# Patient Record
Sex: Male | Born: 1940
Health system: Southern US, Community
[De-identification: ages and names within clinical notes are randomized; demographics above are authoritative.]

## PROBLEM LIST (undated history)

## (undated) DIAGNOSIS — J449 Chronic obstructive pulmonary disease, unspecified: Secondary | ICD-10-CM

## (undated) DIAGNOSIS — K219 Gastro-esophageal reflux disease without esophagitis: Secondary | ICD-10-CM

## (undated) DIAGNOSIS — I5189 Other ill-defined heart diseases: Secondary | ICD-10-CM

## (undated) DIAGNOSIS — C4490 Unspecified malignant neoplasm of skin, unspecified: Secondary | ICD-10-CM

## (undated) DIAGNOSIS — M19011 Primary osteoarthritis, right shoulder: Secondary | ICD-10-CM

## (undated) DIAGNOSIS — G5600 Carpal tunnel syndrome, unspecified upper limb: Secondary | ICD-10-CM

## (undated) DIAGNOSIS — J189 Pneumonia, unspecified organism: Secondary | ICD-10-CM

## (undated) DIAGNOSIS — M5030 Other cervical disc degeneration, unspecified cervical region: Secondary | ICD-10-CM

## (undated) DIAGNOSIS — I10 Essential (primary) hypertension: Secondary | ICD-10-CM

## (undated) DIAGNOSIS — N200 Calculus of kidney: Secondary | ICD-10-CM

## (undated) DIAGNOSIS — I209 Angina pectoris, unspecified: Secondary | ICD-10-CM

## (undated) DIAGNOSIS — R55 Syncope and collapse: Secondary | ICD-10-CM

## (undated) DIAGNOSIS — I6523 Occlusion and stenosis of bilateral carotid arteries: Secondary | ICD-10-CM

## (undated) DIAGNOSIS — G5603 Carpal tunnel syndrome, bilateral upper limbs: Secondary | ICD-10-CM

## (undated) DIAGNOSIS — I38 Endocarditis, valve unspecified: Secondary | ICD-10-CM

## (undated) DIAGNOSIS — I639 Cerebral infarction, unspecified: Secondary | ICD-10-CM

## (undated) DIAGNOSIS — E785 Hyperlipidemia, unspecified: Secondary | ICD-10-CM

## (undated) DIAGNOSIS — I509 Heart failure, unspecified: Secondary | ICD-10-CM

## (undated) DIAGNOSIS — Z0389 Encounter for observation for other suspected diseases and conditions ruled out: Secondary | ICD-10-CM

## (undated) DIAGNOSIS — I4891 Unspecified atrial fibrillation: Secondary | ICD-10-CM

## (undated) DIAGNOSIS — C801 Malignant (primary) neoplasm, unspecified: Secondary | ICD-10-CM

## (undated) DIAGNOSIS — F419 Anxiety disorder, unspecified: Secondary | ICD-10-CM

## (undated) DIAGNOSIS — IMO0001 Reserved for inherently not codable concepts without codable children: Secondary | ICD-10-CM

## (undated) DIAGNOSIS — I2510 Atherosclerotic heart disease of native coronary artery without angina pectoris: Secondary | ICD-10-CM

## (undated) DIAGNOSIS — I82409 Acute embolism and thrombosis of unspecified deep veins of unspecified lower extremity: Secondary | ICD-10-CM

## (undated) DIAGNOSIS — I700 Atherosclerosis of aorta: Secondary | ICD-10-CM

## (undated) DIAGNOSIS — Z7901 Long term (current) use of anticoagulants: Secondary | ICD-10-CM

## (undated) DIAGNOSIS — C44300 Unspecified malignant neoplasm of skin of unspecified part of face: Secondary | ICD-10-CM

## (undated) HISTORY — DX: Carpal tunnel syndrome, unspecified upper limb: G56.00

## (undated) HISTORY — DX: Endocarditis, valve unspecified: I38

## (undated) HISTORY — PX: SKIN CANCER EXCISION: SHX779

## (undated) HISTORY — DX: Heart failure, unspecified: I50.9

## (undated) HISTORY — DX: Calculus of kidney: N20.0

---
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
## 2002-01-10 HISTORY — PX: CARDIAC CATHETERIZATION: SHX172

## 2002-01-10 HISTORY — PX: THROMBECTOMY: PRO61

## 2005-10-06 ENCOUNTER — Inpatient Hospital Stay: Payer: Self-pay | Admitting: Orthopedic Surgery

## 2005-10-06 ENCOUNTER — Other Ambulatory Visit: Payer: Self-pay

## 2007-04-13 ENCOUNTER — Emergency Department: Payer: Self-pay | Admitting: Emergency Medicine

## 2007-04-13 ENCOUNTER — Other Ambulatory Visit: Payer: Self-pay

## 2008-09-29 ENCOUNTER — Emergency Department: Payer: Self-pay | Admitting: Emergency Medicine

## 2009-07-06 ENCOUNTER — Observation Stay: Payer: Self-pay | Admitting: Internal Medicine

## 2009-09-19 ENCOUNTER — Emergency Department: Payer: Self-pay | Admitting: Emergency Medicine

## 2010-11-19 ENCOUNTER — Emergency Department: Payer: Self-pay | Admitting: Emergency Medicine

## 2011-01-25 ENCOUNTER — Observation Stay: Payer: Self-pay | Admitting: Internal Medicine

## 2011-01-25 LAB — URINALYSIS, COMPLETE
Bacteria: NONE SEEN
Bilirubin,UR: NEGATIVE
Glucose,UR: NEGATIVE mg/dL (ref 0–75)
Ketone: NEGATIVE
RBC,UR: NONE SEEN /HPF (ref 0–5)
Specific Gravity: 1.006 (ref 1.003–1.030)
WBC UR: <1 /HPF (ref 0–5)

## 2011-01-25 LAB — COMPREHENSIVE METABOLIC PANEL
Albumin: 3.6 g/dL (ref 3.4–5.0)
Alkaline Phosphatase: 103 U/L (ref 50–136)
Anion Gap: 11 (ref 7–16)
BUN: 15 mg/dL (ref 7–18)
Calcium, Total: 9 mg/dL (ref 8.5–10.1)
Chloride: 105 mmol/L (ref 98–107)
Glucose: 95 mg/dL (ref 65–99)
Potassium: 3.7 mmol/L (ref 3.5–5.1)
SGOT(AST): 26 U/L (ref 15–37)
SGPT (ALT): 19 U/L
Total Protein: 7.4 g/dL (ref 6.4–8.2)

## 2011-01-25 LAB — CBC
MCH: 30.4 pg (ref 26.0–34.0)
MCHC: 33.5 g/dL (ref 32.0–36.0)
Platelet: 193 10*3/uL (ref 150–440)
RDW: 12.7 % (ref 11.5–14.5)

## 2011-01-25 LAB — CK TOTAL AND CKMB (NOT AT ARMC): CK, Total: 52 U/L (ref 35–232)

## 2011-01-25 LAB — TROPONIN I: Troponin-I: <0.02 ng/mL

## 2011-01-26 LAB — CK TOTAL AND CKMB (NOT AT ARMC)
CK, Total: 44 U/L (ref 35–232)
CK-MB: <0.5 ng/mL — ABNORMAL LOW (ref 0.5–3.6)

## 2011-01-26 LAB — TROPONIN I: Troponin-I: <0.02 ng/mL

## 2013-02-05 DIAGNOSIS — F411 Generalized anxiety disorder: Secondary | ICD-10-CM | POA: Diagnosis not present

## 2013-02-05 DIAGNOSIS — R5383 Other fatigue: Secondary | ICD-10-CM | POA: Diagnosis not present

## 2013-02-05 DIAGNOSIS — F3289 Other specified depressive episodes: Secondary | ICD-10-CM | POA: Diagnosis not present

## 2013-02-05 DIAGNOSIS — I10 Essential (primary) hypertension: Secondary | ICD-10-CM | POA: Diagnosis not present

## 2013-02-05 DIAGNOSIS — K219 Gastro-esophageal reflux disease without esophagitis: Secondary | ICD-10-CM | POA: Diagnosis not present

## 2013-02-05 DIAGNOSIS — E7800 Pure hypercholesterolemia, unspecified: Secondary | ICD-10-CM | POA: Diagnosis not present

## 2013-02-05 DIAGNOSIS — R5381 Other malaise: Secondary | ICD-10-CM | POA: Diagnosis not present

## 2013-02-05 DIAGNOSIS — E559 Vitamin D deficiency, unspecified: Secondary | ICD-10-CM | POA: Diagnosis not present

## 2013-03-11 DIAGNOSIS — F329 Major depressive disorder, single episode, unspecified: Secondary | ICD-10-CM | POA: Diagnosis not present

## 2013-03-11 DIAGNOSIS — K219 Gastro-esophageal reflux disease without esophagitis: Secondary | ICD-10-CM | POA: Diagnosis not present

## 2013-03-11 DIAGNOSIS — R109 Unspecified abdominal pain: Secondary | ICD-10-CM | POA: Diagnosis not present

## 2013-03-11 DIAGNOSIS — G5600 Carpal tunnel syndrome, unspecified upper limb: Secondary | ICD-10-CM | POA: Diagnosis not present

## 2013-03-11 DIAGNOSIS — J449 Chronic obstructive pulmonary disease, unspecified: Secondary | ICD-10-CM | POA: Diagnosis not present

## 2013-03-11 DIAGNOSIS — F411 Generalized anxiety disorder: Secondary | ICD-10-CM | POA: Diagnosis not present

## 2013-03-11 DIAGNOSIS — Z79899 Other long term (current) drug therapy: Secondary | ICD-10-CM | POA: Diagnosis not present

## 2013-03-11 DIAGNOSIS — M129 Arthropathy, unspecified: Secondary | ICD-10-CM | POA: Diagnosis not present

## 2013-03-11 DIAGNOSIS — F3289 Other specified depressive episodes: Secondary | ICD-10-CM | POA: Diagnosis not present

## 2013-03-28 DIAGNOSIS — G5600 Carpal tunnel syndrome, unspecified upper limb: Secondary | ICD-10-CM | POA: Diagnosis not present

## 2013-04-03 DIAGNOSIS — G5600 Carpal tunnel syndrome, unspecified upper limb: Secondary | ICD-10-CM | POA: Diagnosis not present

## 2013-04-11 DIAGNOSIS — G5600 Carpal tunnel syndrome, unspecified upper limb: Secondary | ICD-10-CM | POA: Diagnosis not present

## 2013-04-11 DIAGNOSIS — F411 Generalized anxiety disorder: Secondary | ICD-10-CM | POA: Diagnosis not present

## 2013-04-11 DIAGNOSIS — J449 Chronic obstructive pulmonary disease, unspecified: Secondary | ICD-10-CM | POA: Diagnosis not present

## 2013-04-11 DIAGNOSIS — F329 Major depressive disorder, single episode, unspecified: Secondary | ICD-10-CM | POA: Diagnosis not present

## 2013-04-11 DIAGNOSIS — F3289 Other specified depressive episodes: Secondary | ICD-10-CM | POA: Diagnosis not present

## 2013-04-11 DIAGNOSIS — M129 Arthropathy, unspecified: Secondary | ICD-10-CM | POA: Diagnosis not present

## 2013-05-10 DIAGNOSIS — F411 Generalized anxiety disorder: Secondary | ICD-10-CM | POA: Diagnosis not present

## 2013-05-10 DIAGNOSIS — F3289 Other specified depressive episodes: Secondary | ICD-10-CM | POA: Diagnosis not present

## 2013-05-10 DIAGNOSIS — I10 Essential (primary) hypertension: Secondary | ICD-10-CM | POA: Diagnosis not present

## 2013-05-10 DIAGNOSIS — R5381 Other malaise: Secondary | ICD-10-CM | POA: Diagnosis not present

## 2013-05-10 DIAGNOSIS — E7800 Pure hypercholesterolemia, unspecified: Secondary | ICD-10-CM | POA: Diagnosis not present

## 2013-05-10 DIAGNOSIS — K219 Gastro-esophageal reflux disease without esophagitis: Secondary | ICD-10-CM | POA: Diagnosis not present

## 2013-05-10 DIAGNOSIS — Z79899 Other long term (current) drug therapy: Secondary | ICD-10-CM | POA: Diagnosis not present

## 2013-05-10 DIAGNOSIS — R1013 Epigastric pain: Secondary | ICD-10-CM | POA: Diagnosis not present

## 2013-05-10 DIAGNOSIS — F329 Major depressive disorder, single episode, unspecified: Secondary | ICD-10-CM | POA: Diagnosis not present

## 2013-05-10 DIAGNOSIS — R5383 Other fatigue: Secondary | ICD-10-CM | POA: Diagnosis not present

## 2013-05-30 ENCOUNTER — Ambulatory Visit: Payer: Self-pay | Admitting: Orthopedic Surgery

## 2013-05-30 DIAGNOSIS — I10 Essential (primary) hypertension: Secondary | ICD-10-CM | POA: Diagnosis not present

## 2013-05-30 DIAGNOSIS — Z01810 Encounter for preprocedural cardiovascular examination: Secondary | ICD-10-CM | POA: Diagnosis not present

## 2013-06-11 ENCOUNTER — Ambulatory Visit: Payer: Self-pay | Admitting: Orthopedic Surgery

## 2013-06-11 DIAGNOSIS — Z79899 Other long term (current) drug therapy: Secondary | ICD-10-CM | POA: Diagnosis not present

## 2013-06-11 DIAGNOSIS — R1013 Epigastric pain: Secondary | ICD-10-CM | POA: Diagnosis not present

## 2013-06-11 DIAGNOSIS — R0602 Shortness of breath: Secondary | ICD-10-CM | POA: Diagnosis not present

## 2013-06-11 DIAGNOSIS — Z8673 Personal history of transient ischemic attack (TIA), and cerebral infarction without residual deficits: Secondary | ICD-10-CM | POA: Diagnosis not present

## 2013-06-11 DIAGNOSIS — G5600 Carpal tunnel syndrome, unspecified upper limb: Secondary | ICD-10-CM | POA: Diagnosis not present

## 2013-06-11 DIAGNOSIS — Z87891 Personal history of nicotine dependence: Secondary | ICD-10-CM | POA: Diagnosis not present

## 2013-06-11 DIAGNOSIS — I10 Essential (primary) hypertension: Secondary | ICD-10-CM | POA: Diagnosis not present

## 2013-06-11 DIAGNOSIS — K3189 Other diseases of stomach and duodenum: Secondary | ICD-10-CM | POA: Diagnosis not present

## 2013-06-11 DIAGNOSIS — J449 Chronic obstructive pulmonary disease, unspecified: Secondary | ICD-10-CM | POA: Diagnosis not present

## 2013-06-11 DIAGNOSIS — E785 Hyperlipidemia, unspecified: Secondary | ICD-10-CM | POA: Diagnosis not present

## 2013-06-11 HISTORY — PX: CARPAL TUNNEL RELEASE: SHX101

## 2013-06-14 DIAGNOSIS — I10 Essential (primary) hypertension: Secondary | ICD-10-CM | POA: Diagnosis not present

## 2013-06-14 DIAGNOSIS — F329 Major depressive disorder, single episode, unspecified: Secondary | ICD-10-CM | POA: Diagnosis not present

## 2013-06-14 DIAGNOSIS — J449 Chronic obstructive pulmonary disease, unspecified: Secondary | ICD-10-CM | POA: Diagnosis not present

## 2013-06-14 DIAGNOSIS — F411 Generalized anxiety disorder: Secondary | ICD-10-CM | POA: Diagnosis not present

## 2013-06-14 DIAGNOSIS — F3289 Other specified depressive episodes: Secondary | ICD-10-CM | POA: Diagnosis not present

## 2013-06-26 DIAGNOSIS — Z9889 Other specified postprocedural states: Secondary | ICD-10-CM | POA: Insufficient documentation

## 2013-07-02 DIAGNOSIS — Z9889 Other specified postprocedural states: Secondary | ICD-10-CM | POA: Diagnosis not present

## 2013-07-05 DIAGNOSIS — Z9889 Other specified postprocedural states: Secondary | ICD-10-CM | POA: Diagnosis not present

## 2013-07-09 DIAGNOSIS — M25539 Pain in unspecified wrist: Secondary | ICD-10-CM | POA: Diagnosis not present

## 2013-07-11 DIAGNOSIS — M25539 Pain in unspecified wrist: Secondary | ICD-10-CM | POA: Diagnosis not present

## 2013-07-16 DIAGNOSIS — M25539 Pain in unspecified wrist: Secondary | ICD-10-CM | POA: Diagnosis not present

## 2013-07-18 DIAGNOSIS — M25539 Pain in unspecified wrist: Secondary | ICD-10-CM | POA: Diagnosis not present

## 2013-07-26 DIAGNOSIS — J018 Other acute sinusitis: Secondary | ICD-10-CM | POA: Diagnosis not present

## 2013-07-26 DIAGNOSIS — J209 Acute bronchitis, unspecified: Secondary | ICD-10-CM | POA: Diagnosis not present

## 2013-08-08 DIAGNOSIS — M129 Arthropathy, unspecified: Secondary | ICD-10-CM | POA: Diagnosis not present

## 2013-08-08 DIAGNOSIS — F329 Major depressive disorder, single episode, unspecified: Secondary | ICD-10-CM | POA: Diagnosis not present

## 2013-08-08 DIAGNOSIS — J449 Chronic obstructive pulmonary disease, unspecified: Secondary | ICD-10-CM | POA: Diagnosis not present

## 2013-08-08 DIAGNOSIS — F3289 Other specified depressive episodes: Secondary | ICD-10-CM | POA: Diagnosis not present

## 2013-08-08 DIAGNOSIS — F411 Generalized anxiety disorder: Secondary | ICD-10-CM | POA: Diagnosis not present

## 2013-08-09 ENCOUNTER — Encounter: Payer: Self-pay | Admitting: Orthopedic Surgery

## 2013-08-10 ENCOUNTER — Inpatient Hospital Stay: Payer: Self-pay | Admitting: Internal Medicine

## 2013-08-10 DIAGNOSIS — I658 Occlusion and stenosis of other precerebral arteries: Secondary | ICD-10-CM | POA: Diagnosis not present

## 2013-08-10 DIAGNOSIS — F29 Unspecified psychosis not due to a substance or known physiological condition: Secondary | ICD-10-CM | POA: Diagnosis not present

## 2013-08-10 DIAGNOSIS — E860 Dehydration: Secondary | ICD-10-CM | POA: Diagnosis present

## 2013-08-10 DIAGNOSIS — R4182 Altered mental status, unspecified: Secondary | ICD-10-CM | POA: Diagnosis not present

## 2013-08-10 DIAGNOSIS — E785 Hyperlipidemia, unspecified: Secondary | ICD-10-CM | POA: Diagnosis present

## 2013-08-10 DIAGNOSIS — G459 Transient cerebral ischemic attack, unspecified: Secondary | ICD-10-CM | POA: Diagnosis present

## 2013-08-10 DIAGNOSIS — N179 Acute kidney failure, unspecified: Secondary | ICD-10-CM | POA: Diagnosis present

## 2013-08-10 DIAGNOSIS — Z0389 Encounter for observation for other suspected diseases and conditions ruled out: Secondary | ICD-10-CM | POA: Diagnosis not present

## 2013-08-10 DIAGNOSIS — I10 Essential (primary) hypertension: Secondary | ICD-10-CM | POA: Diagnosis present

## 2013-08-10 DIAGNOSIS — Z87891 Personal history of nicotine dependence: Secondary | ICD-10-CM | POA: Diagnosis not present

## 2013-08-10 DIAGNOSIS — Z8673 Personal history of transient ischemic attack (TIA), and cerebral infarction without residual deficits: Secondary | ICD-10-CM | POA: Diagnosis not present

## 2013-08-10 DIAGNOSIS — R51 Headache: Secondary | ICD-10-CM | POA: Diagnosis not present

## 2013-08-10 DIAGNOSIS — G9341 Metabolic encephalopathy: Secondary | ICD-10-CM | POA: Diagnosis present

## 2013-08-10 DIAGNOSIS — R4789 Other speech disturbances: Secondary | ICD-10-CM | POA: Diagnosis not present

## 2013-08-10 DIAGNOSIS — F4489 Other dissociative and conversion disorders: Secondary | ICD-10-CM | POA: Diagnosis not present

## 2013-08-10 LAB — COMPREHENSIVE METABOLIC PANEL
ALK PHOS: 112 U/L
ANION GAP: 9 (ref 7–16)
Albumin: 3.6 g/dL (ref 3.4–5.0)
BILIRUBIN TOTAL: 0.7 mg/dL (ref 0.2–1.0)
BUN: 47 mg/dL — AB (ref 7–18)
CO2: 24 mmol/L (ref 21–32)
Calcium, Total: 8.7 mg/dL (ref 8.5–10.1)
Chloride: 106 mmol/L (ref 98–107)
Creatinine: 3.74 mg/dL — ABNORMAL HIGH (ref 0.60–1.30)
EGFR (African American): 18 — ABNORMAL LOW
EGFR (Non-African Amer.): 15 — ABNORMAL LOW
GLUCOSE: 103 mg/dL — AB (ref 65–99)
Osmolality: 290 (ref 275–301)
POTASSIUM: 3.8 mmol/L (ref 3.5–5.1)
SGOT(AST): 27 U/L (ref 15–37)
SGPT (ALT): 22 U/L
SODIUM: 139 mmol/L (ref 136–145)
TOTAL PROTEIN: 7.4 g/dL (ref 6.4–8.2)

## 2013-08-10 LAB — URINALYSIS, COMPLETE
BACTERIA: NONE SEEN
BLOOD: NEGATIVE
Glucose,UR: NEGATIVE mg/dL (ref 0–75)
Hyaline Cast: 36
KETONE: NEGATIVE
LEUKOCYTE ESTERASE: NEGATIVE
Nitrite: NEGATIVE
PROTEIN: NEGATIVE
Ph: 5 (ref 4.5–8.0)
Specific Gravity: 1.014 (ref 1.003–1.030)
Squamous Epithelial: 1
WBC UR: 1 /HPF (ref 0–5)

## 2013-08-10 LAB — CBC
HCT: 36.8 % — ABNORMAL LOW (ref 40.0–52.0)
HGB: 12.2 g/dL — AB (ref 13.0–18.0)
MCH: 30.6 pg (ref 26.0–34.0)
MCHC: 33.1 g/dL (ref 32.0–36.0)
MCV: 93 fL (ref 80–100)
Platelet: 194 10*3/uL (ref 150–440)
RBC: 3.98 10*6/uL — ABNORMAL LOW (ref 4.40–5.90)
RDW: 13.5 % (ref 11.5–14.5)
WBC: 7.9 10*3/uL (ref 3.8–10.6)

## 2013-08-10 LAB — TROPONIN I: Troponin-I: <0.02 ng/mL

## 2013-08-11 ENCOUNTER — Ambulatory Visit: Payer: Self-pay | Admitting: Neurology

## 2013-08-11 DIAGNOSIS — N179 Acute kidney failure, unspecified: Secondary | ICD-10-CM | POA: Diagnosis not present

## 2013-08-11 DIAGNOSIS — E860 Dehydration: Secondary | ICD-10-CM | POA: Diagnosis not present

## 2013-08-11 DIAGNOSIS — E785 Hyperlipidemia, unspecified: Secondary | ICD-10-CM | POA: Diagnosis not present

## 2013-08-11 DIAGNOSIS — R4182 Altered mental status, unspecified: Secondary | ICD-10-CM | POA: Diagnosis not present

## 2013-08-11 DIAGNOSIS — G9341 Metabolic encephalopathy: Secondary | ICD-10-CM | POA: Diagnosis not present

## 2013-08-11 DIAGNOSIS — G459 Transient cerebral ischemic attack, unspecified: Secondary | ICD-10-CM | POA: Diagnosis not present

## 2013-08-11 DIAGNOSIS — I10 Essential (primary) hypertension: Secondary | ICD-10-CM | POA: Diagnosis not present

## 2013-08-11 LAB — CBC WITH DIFFERENTIAL/PLATELET
Basophil #: 0 10*3/uL (ref 0.0–0.1)
Basophil %: 0.5 %
EOS PCT: 4.6 %
Eosinophil #: 0.3 10*3/uL (ref 0.0–0.7)
HCT: 34.5 % — ABNORMAL LOW (ref 40.0–52.0)
HGB: 11.3 g/dL — AB (ref 13.0–18.0)
LYMPHS PCT: 24.9 %
Lymphocyte #: 1.4 10*3/uL (ref 1.0–3.6)
MCH: 30.2 pg (ref 26.0–34.0)
MCHC: 32.6 g/dL (ref 32.0–36.0)
MCV: 93 fL (ref 80–100)
MONO ABS: 0.5 x10 3/mm (ref 0.2–1.0)
Monocyte %: 9.1 %
NEUTROS ABS: 3.4 10*3/uL (ref 1.4–6.5)
Neutrophil %: 60.9 %
PLATELETS: 139 10*3/uL — AB (ref 150–440)
RBC: 3.73 10*6/uL — ABNORMAL LOW (ref 4.40–5.90)
RDW: 13.6 % (ref 11.5–14.5)
WBC: 5.5 10*3/uL (ref 3.8–10.6)

## 2013-08-11 LAB — BASIC METABOLIC PANEL
ANION GAP: 9 (ref 7–16)
BUN: 40 mg/dL — ABNORMAL HIGH (ref 7–18)
CALCIUM: 8 mg/dL — AB (ref 8.5–10.1)
Chloride: 111 mmol/L — ABNORMAL HIGH (ref 98–107)
Co2: 22 mmol/L (ref 21–32)
Creatinine: 2.52 mg/dL — ABNORMAL HIGH (ref 0.60–1.30)
EGFR (African American): 28 — ABNORMAL LOW
EGFR (Non-African Amer.): 24 — ABNORMAL LOW
Glucose: 99 mg/dL (ref 65–99)
Osmolality: 293 (ref 275–301)
POTASSIUM: 3.8 mmol/L (ref 3.5–5.1)
Sodium: 142 mmol/L (ref 136–145)

## 2013-08-12 DIAGNOSIS — G459 Transient cerebral ischemic attack, unspecified: Secondary | ICD-10-CM | POA: Diagnosis not present

## 2013-08-12 DIAGNOSIS — E860 Dehydration: Secondary | ICD-10-CM | POA: Diagnosis not present

## 2013-08-12 DIAGNOSIS — R4182 Altered mental status, unspecified: Secondary | ICD-10-CM | POA: Diagnosis not present

## 2013-08-12 DIAGNOSIS — N179 Acute kidney failure, unspecified: Secondary | ICD-10-CM | POA: Diagnosis not present

## 2013-08-12 DIAGNOSIS — I10 Essential (primary) hypertension: Secondary | ICD-10-CM | POA: Diagnosis not present

## 2013-08-12 DIAGNOSIS — F29 Unspecified psychosis not due to a substance or known physiological condition: Secondary | ICD-10-CM | POA: Diagnosis not present

## 2013-08-12 LAB — BASIC METABOLIC PANEL
Anion Gap: 5 — ABNORMAL LOW (ref 7–16)
BUN: 25 mg/dL — ABNORMAL HIGH (ref 7–18)
CO2: 26 mmol/L (ref 21–32)
Calcium, Total: 8.3 mg/dL — ABNORMAL LOW (ref 8.5–10.1)
Chloride: 113 mmol/L — ABNORMAL HIGH (ref 98–107)
Creatinine: 1.42 mg/dL — ABNORMAL HIGH (ref 0.60–1.30)
EGFR (African American): 57 — ABNORMAL LOW
GFR CALC NON AF AMER: 49 — AB
GLUCOSE: 97 mg/dL (ref 65–99)
Osmolality: 291 (ref 275–301)
Potassium: 4.5 mmol/L (ref 3.5–5.1)
SODIUM: 144 mmol/L (ref 136–145)

## 2013-08-13 ENCOUNTER — Encounter: Payer: Self-pay | Admitting: Orthopedic Surgery

## 2013-08-13 DIAGNOSIS — M2560 Stiffness of unspecified joint, not elsewhere classified: Secondary | ICD-10-CM | POA: Diagnosis not present

## 2013-08-13 DIAGNOSIS — M79609 Pain in unspecified limb: Secondary | ICD-10-CM | POA: Diagnosis not present

## 2013-08-13 DIAGNOSIS — M6281 Muscle weakness (generalized): Secondary | ICD-10-CM | POA: Diagnosis not present

## 2013-08-13 DIAGNOSIS — IMO0001 Reserved for inherently not codable concepts without codable children: Secondary | ICD-10-CM | POA: Diagnosis not present

## 2013-08-13 DIAGNOSIS — G5600 Carpal tunnel syndrome, unspecified upper limb: Secondary | ICD-10-CM | POA: Diagnosis not present

## 2013-08-20 DIAGNOSIS — E559 Vitamin D deficiency, unspecified: Secondary | ICD-10-CM | POA: Diagnosis not present

## 2013-08-20 DIAGNOSIS — F329 Major depressive disorder, single episode, unspecified: Secondary | ICD-10-CM | POA: Diagnosis not present

## 2013-08-20 DIAGNOSIS — F411 Generalized anxiety disorder: Secondary | ICD-10-CM | POA: Diagnosis not present

## 2013-08-20 DIAGNOSIS — Z79899 Other long term (current) drug therapy: Secondary | ICD-10-CM | POA: Diagnosis not present

## 2013-08-20 DIAGNOSIS — R5381 Other malaise: Secondary | ICD-10-CM | POA: Diagnosis not present

## 2013-08-20 DIAGNOSIS — R1013 Epigastric pain: Secondary | ICD-10-CM | POA: Diagnosis not present

## 2013-08-20 DIAGNOSIS — N320 Bladder-neck obstruction: Secondary | ICD-10-CM | POA: Diagnosis not present

## 2013-08-20 DIAGNOSIS — J449 Chronic obstructive pulmonary disease, unspecified: Secondary | ICD-10-CM | POA: Diagnosis not present

## 2013-08-20 DIAGNOSIS — E7800 Pure hypercholesterolemia, unspecified: Secondary | ICD-10-CM | POA: Diagnosis not present

## 2013-08-20 DIAGNOSIS — F3289 Other specified depressive episodes: Secondary | ICD-10-CM | POA: Diagnosis not present

## 2013-08-20 DIAGNOSIS — R5383 Other fatigue: Secondary | ICD-10-CM | POA: Diagnosis not present

## 2013-08-20 DIAGNOSIS — E049 Nontoxic goiter, unspecified: Secondary | ICD-10-CM | POA: Diagnosis not present

## 2013-08-20 DIAGNOSIS — T675XXA Heat exhaustion, unspecified, initial encounter: Secondary | ICD-10-CM | POA: Diagnosis not present

## 2013-08-23 DIAGNOSIS — F329 Major depressive disorder, single episode, unspecified: Secondary | ICD-10-CM | POA: Diagnosis not present

## 2013-08-23 DIAGNOSIS — F3289 Other specified depressive episodes: Secondary | ICD-10-CM | POA: Diagnosis not present

## 2013-08-23 DIAGNOSIS — G541 Lumbosacral plexus disorders: Secondary | ICD-10-CM | POA: Diagnosis not present

## 2013-08-23 DIAGNOSIS — G544 Lumbosacral root disorders, not elsewhere classified: Secondary | ICD-10-CM | POA: Diagnosis not present

## 2013-08-23 DIAGNOSIS — M810 Age-related osteoporosis without current pathological fracture: Secondary | ICD-10-CM | POA: Diagnosis not present

## 2013-08-23 DIAGNOSIS — K219 Gastro-esophageal reflux disease without esophagitis: Secondary | ICD-10-CM | POA: Diagnosis not present

## 2013-08-23 DIAGNOSIS — F411 Generalized anxiety disorder: Secondary | ICD-10-CM | POA: Diagnosis not present

## 2013-08-23 DIAGNOSIS — M545 Low back pain, unspecified: Secondary | ICD-10-CM | POA: Diagnosis not present

## 2013-09-10 ENCOUNTER — Encounter: Payer: Self-pay | Admitting: Orthopedic Surgery

## 2013-10-30 DIAGNOSIS — Z23 Encounter for immunization: Secondary | ICD-10-CM | POA: Diagnosis not present

## 2014-01-10 DIAGNOSIS — G459 Transient cerebral ischemic attack, unspecified: Secondary | ICD-10-CM

## 2014-01-10 HISTORY — DX: Transient cerebral ischemic attack, unspecified: G45.9

## 2014-02-04 DIAGNOSIS — J449 Chronic obstructive pulmonary disease, unspecified: Secondary | ICD-10-CM | POA: Diagnosis not present

## 2014-02-04 DIAGNOSIS — J209 Acute bronchitis, unspecified: Secondary | ICD-10-CM | POA: Diagnosis not present

## 2014-02-04 DIAGNOSIS — F329 Major depressive disorder, single episode, unspecified: Secondary | ICD-10-CM | POA: Diagnosis not present

## 2014-02-04 DIAGNOSIS — F419 Anxiety disorder, unspecified: Secondary | ICD-10-CM | POA: Diagnosis not present

## 2014-02-13 DIAGNOSIS — Z87891 Personal history of nicotine dependence: Secondary | ICD-10-CM | POA: Diagnosis not present

## 2014-02-13 DIAGNOSIS — R0989 Other specified symptoms and signs involving the circulatory and respiratory systems: Secondary | ICD-10-CM | POA: Diagnosis not present

## 2014-02-25 DIAGNOSIS — R1032 Left lower quadrant pain: Secondary | ICD-10-CM | POA: Diagnosis not present

## 2014-02-25 DIAGNOSIS — E559 Vitamin D deficiency, unspecified: Secondary | ICD-10-CM | POA: Diagnosis not present

## 2014-02-25 DIAGNOSIS — Z0000 Encounter for general adult medical examination without abnormal findings: Secondary | ICD-10-CM | POA: Diagnosis not present

## 2014-02-25 DIAGNOSIS — E780 Pure hypercholesterolemia: Secondary | ICD-10-CM | POA: Diagnosis not present

## 2014-02-25 DIAGNOSIS — F419 Anxiety disorder, unspecified: Secondary | ICD-10-CM | POA: Diagnosis not present

## 2014-02-25 DIAGNOSIS — K219 Gastro-esophageal reflux disease without esophagitis: Secondary | ICD-10-CM | POA: Diagnosis not present

## 2014-02-25 DIAGNOSIS — Z1389 Encounter for screening for other disorder: Secondary | ICD-10-CM | POA: Diagnosis not present

## 2014-02-25 DIAGNOSIS — F329 Major depressive disorder, single episode, unspecified: Secondary | ICD-10-CM | POA: Diagnosis not present

## 2014-02-25 DIAGNOSIS — R5381 Other malaise: Secondary | ICD-10-CM | POA: Diagnosis not present

## 2014-03-25 DIAGNOSIS — I10 Essential (primary) hypertension: Secondary | ICD-10-CM | POA: Diagnosis not present

## 2014-03-25 DIAGNOSIS — F329 Major depressive disorder, single episode, unspecified: Secondary | ICD-10-CM | POA: Diagnosis not present

## 2014-03-25 DIAGNOSIS — J449 Chronic obstructive pulmonary disease, unspecified: Secondary | ICD-10-CM | POA: Diagnosis not present

## 2014-03-25 DIAGNOSIS — F419 Anxiety disorder, unspecified: Secondary | ICD-10-CM | POA: Diagnosis not present

## 2014-03-26 DIAGNOSIS — J449 Chronic obstructive pulmonary disease, unspecified: Secondary | ICD-10-CM | POA: Diagnosis not present

## 2014-03-26 DIAGNOSIS — E784 Other hyperlipidemia: Secondary | ICD-10-CM | POA: Diagnosis not present

## 2014-03-26 DIAGNOSIS — I6529 Occlusion and stenosis of unspecified carotid artery: Secondary | ICD-10-CM | POA: Diagnosis not present

## 2014-03-26 DIAGNOSIS — I669 Occlusion and stenosis of unspecified cerebral artery: Secondary | ICD-10-CM | POA: Diagnosis not present

## 2014-03-26 DIAGNOSIS — R011 Cardiac murmur, unspecified: Secondary | ICD-10-CM | POA: Diagnosis not present

## 2014-03-26 DIAGNOSIS — I517 Cardiomegaly: Secondary | ICD-10-CM | POA: Diagnosis not present

## 2014-03-26 DIAGNOSIS — I10 Essential (primary) hypertension: Secondary | ICD-10-CM | POA: Diagnosis not present

## 2014-05-03 NOTE — H&P (Signed)
PATIENT NAME:  [name] Browning, DOREN MR#:  [id number] DATE OF BIRTH:  1940/12/12  DATE OF ADMISSION:  08/10/2013  REFERRING PHYSICIAN:  Wells Guiles L. Lord, MD.  FAMILY PHYSICIAN: Meindert A. Brunetta Genera, MD.   REASON FOR ADMISSION: Altered mental status.   HISTORY OF PRESENT ILLNESS: The patient is a 74 year old male with a history of previous stroke, hypertension, hyperlipidemia, who was working outside all day today in the heat. Took his blood pressure medication this morning. Presents to the Emergency Room with transient left-sided weakness which has resolved associated with altered mental status, confusion. Poor p.o. intake this evening. In the Emergency Room, the patient was noted to be relatively hypotensive and dehydrated, in acute renal failure. Head CT was unremarkable. He is now admitted for further evaluation.   PAST MEDICAL HISTORY: 1.  Previous stroke.  2.  Benign hypertension.  3.  Hyperlipidemia.  4.  Status post carpal tunnel surgery.   MEDICATIONS: 1.  Zestoretic 10/12.5 mg 1 p.o. daily.  2.  Zocor 40 mg p.o. at bedtime.   ALLERGIES: No known drug allergies.   SOCIAL HISTORY: The patient has a remote history of tobacco abuse. No history of alcohol abuse.   FAMILY HISTORY: Positive for hypertension, stroke and coronary artery disease.   REVIEW OF SYSTEMS:  CONSTITUTIONAL: No fever or change in weight.   EYES: No blurred or double vision. No glaucoma.  ENT: No tinnitus or hearing loss. No nasal discharge or bleeding. No difficulty swallowing.  RESPIRATORY: No cough or wheezing. Denies hemoptysis.  CARDIOVASCULAR: No chest pain or orthopnea. No palpitations. No syncope.  GASTROINTESTINAL:  No nausea, vomiting, or diarrhea. No abdominal pain.  GENITOURINARY: No dysuria or hematuria. No incontinence.  ENDOCRINE: No polyuria or polydipsia. No heat or cold intolerance.  HEMATOLOGIC: The patient denies anemia, easy bruising, or bleeding.  LYMPHATIC: No swollen glands.   MUSCULOSKELETAL: The patient denies pain in his neck, back, shoulders, knees or hips. No gout.  NEUROLOGIC: No numbness or migraines. Denies seizures.  PSYCHIATRIC: The patient denies anxiety, insomnia or depression.   PHYSICAL EXAMINATION: GENERAL: The patient is elderly, chronically ill appearing, in no acute distress.  VITAL SIGNS: Currently remarkable. Blood pressure of 107/64, with a heart rate of 71, respiratory rate of 18, temperature of 97.9, saturation 99% on room air.  HEENT: Normocephalic, atraumatic. Pupils equally round, reactive to light and accommodation. Extraocular movements are intact. Sclerae are anicteric. Conjunctivae are clear.  Oropharynx is clear. NECK: Supple without JVD. No lymphadenopathy or thyromegaly is noted.  LUNGS: Clear to auscultation and percussion without wheezes, rales or rhonchi. No dullness. Respiratory effort is normal.  CARDIAC: Regular rate and rhythm with normal S1, S2. No significant rubs, murmurs or gallops. PMI is nondisplaced. Chest wall is nontender.  ABDOMEN: Soft, nontender, with normoactive bowel sounds. No organomegaly or masses were appreciated. No hernias or bruits were noted.  EXTREMITIES: Without clubbing, cyanosis or edema. Pulses were 2+ bilaterally.  SKIN: Warm and dry without rash or lesions.  NEUROLOGIC: Cranial nerves II through XII grossly intact. Deep tendon reflexes were symmetric. Motor and sensory examination is nonfocal.  PSYCHIATRIC: Revealed a patient who is alert and oriented to person, place, and time. He was cooperative and used good judgment.   LABORATORY DATA: EKG revealed sinus rhythm with no acute ischemic changes. Head CT revealed no acute intracranial abnormality. His white count was 7.9 with a hemoglobin of 12.2. Urinalysis negative. Glucose 103 with a BUN of 47, creatinine of 3.74 and a GFR of 15.  ASSESSMENT: 1.  Altered mental status.  2.  Dehydration.  3.  Acute renal failure.  4.  Presumed transient  ischemic attack.  5.  Previous stroke.  6.  Benign hypertension by history.   PLAN: We will hold his blood pressure medication, begin IV fluids. Begin aspirin and subcutaneous heparin. Neuro checks q.4 hours. We will obtain carotid Dopplers and an MRI of the brain. Neurology consult in the morning. Follow up renal labs in the morning after hydration. Continue simvastatin for now. Further treatment and evaluation will depend upon the patient's progress.   TOTAL TIME SPENT ON THIS PATIENT: 45 minutes.    ____________________________ Leonie Douglas Doy Hutching, MD jds:ds D: 08/10/2013 20:11:49 ET T: 08/10/2013 21:29:30 ET JOB#: [id number]  cc: Leonie Douglas. Doy Hutching, MD, <Dictator> Meindert A. Brunetta Genera, MD Nasim Garofano Lennice Sites MD ELECTRONICALLY SIGNED 08/11/2013 15:20

## 2014-05-03 NOTE — Consult Note (Signed)
PATIENT NAME:  Browning Browning MR#:  [id number] DATE OF BIRTH:  Jun 25, 1940  DATE OF CONSULTATION:  08/11/2013  CONSULTING PHYSICIAN:  Leotis Pain, MD  REASON FOR CONSULTATION:  Altered mental status/rule out stroke.   HISTORY OF PRESENT ILLNESS: This is a 74 year old gentleman with past medical history of questionable stroke in the past, hypertension, hyperlipidemia, not on any antiplatelet medication.  On the day of arrival was working outside in the heat with poor p.o. intake and apparently came in dehydrated with altered mental status, found to have acute kidney injury with elevated creatinine over 3.  Upon admission the patient was confused, disoriented could not tell that time, the date, or the reason why he was in the hospital. His mental status slowly began improving as he was in the hospital. He has been hydrated with IV fluids. Upon further questioning the patient does state there was a questionable weakness in the left lower extremity on presentation, but when further questioned the patient stated he had weakness bilateral upper extremities.  CAT scan of the head showed no acute intracranial abnormalities. The patient states his back to baseline right now.  PAST MEDICAL HISTORY: Previous questionable stroke, benign hypertension, hyperlipidemia, bilateral carpal tunnel surgeries.   HOME MEDICATIONS: Include Zocor 40 mg, Zestoretic.    ALLERGIES: No known drug allergies.   SOCIAL HISTORY: The patient has a remote history of tobacco use. No EtOH. No other drug use.   FAMILY HISTORY: Positive history of hypertension and coronary artery disease in the family.   REVIEW OF SYSTEMS: No fever. No fatigue. No generalized weakness. No chest pain. No palpitations. No shortness of breath. No abdominal pain. No diarrhea. No constipation. No frequency of urination. No history of anxiety, depression. No weakness on one side of the body compared to the other. No blurred or double vision.   LABORATORY  DATA: Work-up reviewed.  On admission the patient's creatinine was 3.74, significantly improved today post hydration.   PHYSICAL EXAM: The patient is alert, awake, oriented to time, place, location and the reason why he is in the hospital. His speech appears to be fluent. No dysarthria or aphasia. Facial sensation intact. Facial motor is intact. Tongue is midline. Uvula elevates symmetrically. Shoulder shrug intact. Motor strength appears to be 4+/5 bilaterally in the upper and lower extremities. Sensation intact bilaterally. Coordination: Finger-to-nose intact. Gait not assessed. Reflexes 1+ throughout, diminished.   IMPRESSION: A 74 year old gentleman admitted with what appears to be dehydration, acute kidney injury and altered mental status. It appears his mental status is close to baseline.  At this time he is able to tell me where he is, why he is in the hospital, he is able to recall the events that brought him into the hospital.  Unclear history of left-sided weakness, because on further questioning the patient does state that he has weakness in bilateral upper extremities.   PLAN: From a neurological standpoint he was not on antiplatelet therapy, started on antiplatelet therapy in the hospital. He was already on Zocor, continue that please. In terms of further work-up, I do not see a need to keep the patient the hospital specifically for MRI. If he will stay in the hospital for another day or, we will obtain MRI and stroke work-up. Otherwise, discharge planning as I believe he is back to baseline.   Thank you. It was a pleasure seeing this patient. Please call with any questions.   ____________________________ Leotis Pain, MD yz:lt D: 08/11/2013 14:12:30 ET T: 08/11/2013 15:27:54  ET JOB#: [id number]  cc: Leotis Pain, MD, <Dictator> Leotis Pain MD ELECTRONICALLY SIGNED 08/13/2013 21:16

## 2014-05-03 NOTE — Op Note (Signed)
PATIENT NAME:  [name] Browning, [name] Browning MR#:  [id number] DATE OF BIRTH:  08-18-1940  DATE OF PROCEDURE:  06/11/2013  PREOPERATIVE DIAGNOSIS: Right carpal tunnel syndrome.   POSTOPERATIVE DIAGNOSIS: Right carpal tunnel syndrome.   PROCEDURE: Right carpal tunnel release.   ANESTHESIA: General.   SURGEON: Hessie Knows, M.D.   DESCRIPTION OF PROCEDURE: The patient was brought to the Operating Room, and after adequate anesthesia was obtained, the right arm was prepped and draped in the usual sterile fashion. A tourniquet applied to the right upper forearm. After patient identification, timeout procedures were completed, the tourniquet was raised to 250 mmHg. An approximately 2 cm incision was made over the ring metacarpal. After incision down through the skin and subcutaneous tissue, transverse carpal ligament was identified and incised. Release was carried out proximally and distally with a small hemostat to protect the underlying structures. There was significant flexor tenosynovitis and there appeared to be decompression at the level of the wrist flexion crease. After checking to make sure there was no further proximal compression, the wound was irrigated, 10 mL of 0.5% Sensorcaine without epinephrine was infiltrated around the incision to aid in postoperative analgesia. The wound was closed with simple interrupted 4-0 nylon skin sutures, Xeroform, 4 x 4, Webril and Ace wrap. The patient was sent to the recovery room in stable condition.   ESTIMATED BLOOD LOSS: Minimal.   COMPLICATIONS: None.   SPECIMEN: None.   TOURNIQUET TIME: Eight minutes at 250 mmHg.   ____________________________ Laurene Footman, MD mjm:cg D: 06/12/2013 00:50:51 ET T: 06/12/2013 01:02:13 ET JOB#: [id number]  cc: Laurene Footman, MD, <Dictator> Laurene Footman MD ELECTRONICALLY SIGNED 06/13/2013 7:24

## 2014-05-03 NOTE — Discharge Summary (Signed)
PATIENT NAME:  [name] Browning, [name] Browning MR#:  [id number] DATE OF BIRTH:  1940/11/21  DATE OF ADMISSION:  08/10/2013 DATE OF DISCHARGE:  08/12/2013  PRIMARY CARE PHYSICIAN: Meindert A. Brunetta Genera, MD  DISCHARGE DIAGNOSES: 1.  Altered mental status due to metabolic encephalopathy.  2.  Acute renal failure. 3.  Hypertension. 4.  Previous stroke.   CONDITION: Stable.  CODE STATUS: Full code.   HOME MEDICATIONS: Zocor 40 mg p.o. at bedtime, gabapentin 300 mg p.o. t.i.d., aspirin 81 mg p.o. daily.  DISCONTINUED MEDICATION:  The patient's hydrochlorothiazide/lisinopril was discontinued due to acute renal failure and dehydration. The patient needs to follow up with PCP to check blood pressure and may resume hypertension medication in the future.   DIET:  Low-sodium, low-fat, low-cholesterol diet.   ACTIVITY: As tolerated.  FOLLOWUP CARE: With PCP within 1 to 2 weeks.   REASON FOR ADMISSION: Altered mental status.   HOSPITAL COURSE: The patient is a 74 year old Caucasian male with a history of hypertension and hyperlipidemia who was working outside all day in the heat.  Presented to the ED with left-sided weakness, which resolved.  In addition, the patient had altered mental status with confusion. For a detailed history and physical examination, please refer to the admission note dictated by Dr. Doy Hutching. The patient's CAT scan of head was negative. The patient's laboratory data showed the urinalysis is negative, BUN 47, creatinine is 3.74, WBC 7.9.   ASSESSMENT AND PLAN:  1.  Altered mental status, possibly due to encephalopathy. After admission, the patient's symptoms have much improved to his baseline.  2.  Acute renal failure with dehydration. The patient has been treated with IV fluid support. BUN decreased to 25, creatinine decreased to 1.42.  3.  Hypertension. The patient's hydrochlorothiazide/lisinopril was discontinued due to acute renal failure. The patient is not on any hypertension medication,  but blood pressure is under control.  4.  Previous cerebrovascular accident.  The patient was suspected to have a TIA, but clinically, there is no evidence. He has been treated with aspirin and nystatin. He has no complaints after admission. The patient to get an MRI of the brain today, which did not show any acute CVA.   CONDITION ON DISCHARGE:  The patient has no complaints. Vital signs are stable. He is clinically stable.  DISPOSITION: He will be discharged to home today. I discussed the patient's discharge plan with the patient,  the patient's family member, nurse, and case Freight forwarder.   TIME SPENT: About 36 minutes.    ____________________________ Demetrios Loll, MD qc:db D: 08/12/2013 12:39:00 ET T: 08/12/2013 13:01:31 ET JOB#: [id number]  cc: Demetrios Loll, MD, <Dictator> Demetrios Loll MD ELECTRONICALLY SIGNED 08/12/2013 19:32

## 2014-05-04 NOTE — Discharge Summary (Signed)
PATIENT NAME:  [name] Browning, [name] Browning MR#:  [id number] DATE OF BIRTH:  10/01/1940  DATE OF ADMISSION:  01/25/2011 DATE OF DISCHARGE:  01/26/2011  ADMITTING DIAGNOSIS: Presyncope.  DISCHARGE DIAGNOSES:  1. Presyncope of unclear etiology at this time.  2. Malignant hypertension, resolved. 3. Hyperlipidemia.  4. Medical noncompliance.  5. Suspected cervical peripheral neuropathy with known cervical degenerative disk disease.  6. Difficulty hearing due to bilateral ear cerumen.  DISCHARGE CONDITION: Stable.   DISCHARGE MEDICATIONS: The patient is to resume his outpatient medications which include the following. 1. HCTZ/lisinopril 25/20 mg one tablet once daily.  2. Pravastatin 20 mg p.o. at bedtime. 3. Carbamide peroxide 6.5% optic solution one drop to each affected eye twice daily.  4. Aspirin 81 mg p.o. daily.   DIET: 2 grams salt, low fat, low cholesterol.   ACTIVITY LIMITATIONS: As tolerated. The patient was advised not to stand up from a sitting position suddenly.   DISCHARGE FOLLOWUP: Followup with Dr. Lorelee Market in two days after discharge.   NOTE: The patient received the flu vaccine as well as pneumonia vaccine upon discharge.   CONSULTANTS: Care Management.   RADIOLOGIC STUDIES: CT of the head without contrast, on 01/25/2011, showed no acute intracranial abnormalities, stable appearance.  CTA angiographically of carotids including vertebral arteries revealed no coronary artery stenosis and spiculated left apical air space opacity which may represent fibrosis, but followup CT of the chest without IV contrast in three months is recommended to evaluate stability.  HISTORY OF PRESENT ILLNESS: The patient is a 74 year old Caucasian male with past medical history significant for history of hypertension as well as hyperlipidemia who presented to the hospital with presyncope episode. Apparently he had a few syncopal episodes on the day of admission as well as elevated blood pressure.  He reported not taking his blood pressure medications in the past three months. He also reported eating salty food.   On arrival to the emergency room, the patient was afebrile. His pulse was 56, blood pressure 199/100, and saturation was 99% on room air. Physical examination was unremarkable except for cerumen in the patient's ears.   LABS/STUDIES: Lab data showed a normal BMP as well as liver enzymes were normal. Cardiac enzymes first set as well as subsequent two more sets were within normal limits. CBC was within normal limits.   Urinalysis was unremarkable.   The patient's EKG showed normal sinus rhythm at 60 beats per minute, possible anterior infarct, age undetermined. No acute ST-T changes were noted, however,   HOSPITAL COURSE: The patient was admitted to the hospital. As mentioned above, his cardiac enzymes were cycled. He had orthostatic vital signs checked which were unremarkable. Because of recent carotid ultrasound done in June 2011 this was not repeated, however, as carotid ultrasound in the past showed poor visualization of left vertebral, which was concerning for it being hyperplastic or occluded, the decision was made to get CTA of his carotid arteries as well as vertebral arteries. CTA of his carotid and vertebral arteries did not show any occlusion and it showed normal patent but hyperplastic left vertebral artery. However, there was a bigger right vertebral artery. It was felt that the patient's symptoms such as numbness in his fingers, fleeting to one side of his hand to the other side, is felt possibly related to degenerative disk disease of his cervical spine which the patient is known to have. However, this evaluation did not show the reason for the patient having syncopal episodes. Apparently in the recent past  the patient had a stress test. In fact he had chest pain evaluation in June 2007 as well as 2011. At that time, in June 2011, he had a negative Myoview stress test. He also  had cardiac catheterization in 2004 which showed no evidence of coronary artery disease. Last Myoview done in June 2011 also revealed an ejection fraction of 60%. No further evaluation cardiac reasons of syncope were entertained at this time. However, if the patient continues to have changes and presyncopal episodes, it would be possibly prudent that the patient would have a Holter monitor placed.   For hypertension, the patient's blood pressure medications where resumed and the patient's blood pressure normalized. It was felt that the patient's elevated blood pressure was very likely related to his noncompliance.   On the day of discharge, the patient's temperature was 97.3, pulse 79, respiration rate 20, blood pressure 120/74, and saturation was 94 to 96% on room air at rest.  Regarding difficulty hearing, the patient was given Debrox otic drops to both ears twice a day to try to clean up his ears from cerumen impaction.  The patient was advised also to continue follow-up with his primary care physician who he is to see in the next few days after discharge for further recommendations.   TIME SPENT: 40 minutes. ____________________________ Theodoro Grist, MD rv:slb D: 01/26/2011 19:47:26 ET T: 01/27/2011 14:16:44 ET JOB#: [id number]  cc: Theodoro Grist, MD, <Dictator> Meindert A. Brunetta Genera, MD Theodoro Grist MD ELECTRONICALLY SIGNED 02/07/2011 7:49

## 2014-05-04 NOTE — H&P (Signed)
PATIENT NAME:  [name] Browning, [name] Browning MR#:  [id number] DATE OF BIRTH:  1940/05/24  DATE OF ADMISSION:  01/25/2011  PRIMARY CARE PHYSICIAN: Lorelee Market, MD   CHIEF COMPLAINT: Near syncope and uncontrolled blood pressure.   HISTORY OF PRESENT ILLNESS: [name] Browning is a 74 year old Caucasian gentleman who has history of hypertension and hyperlipidemia. He is accompanied by family members to [name] after he had a syncopal episode x2 today. [name] patient has history of hypertension. He reports not taking his blood pressure medicine for [name] past three months, likes to eat salty food, and had near syncopal episode while playing with his dog and had another episode while he was getting dressed to come to [name] hospital witnessed by [name] Browning. [name] patient lost consciousness for a couple of seconds only. Family reports [name] patient's blood pressure has been running in [name] 200's and he still remains noncompliant with medications and follow-up with primary care physician. His blood pressure in [name] was noted to be 199/100. His orthostatic vitals are stable. He is being admitted for further evaluation and management.   PAST MEDICAL HISTORY:  1. Chest pain evaluation 2007 and 2011 with negative Myoview stress test both times. He also had cardiac cath in 2004 which showed no evidence of coronary artery disease. His last Myoview was done in June of 2011 with EF of around 60%.  2. Hypertension.  3. Hyperlipidemia.  4. Medical noncompliance.   ALLERGIES: No drug or environmental allergies.   MEDICATIONS: He does not take any pills currently.   SOCIAL HISTORY: Used to smoke for a long time, quit about 15 years ago. No history of any drug use.   FAMILY HISTORY: Positive for heart disease in brother, father, and mother. History of emphysema in father.  REVIEW OF SYSTEMS: CONSTITUTIONAL: No fever, fatigue, or weakness. EYES: No blurred or double vision. ENT: No tinnitus, ear pain, hearing  loss. RESPIRATORY: No cough, wheeze, hemoptysis. CARDIOVASCULAR: No chest pain, orthopnea, or edema. GI: No nausea, vomiting, diarrhea, or abdominal pain. GU: No dysuria or hematuria. ENDOCRINE: No polyuria or nocturia. HEMATOLOGY: No anemia or easy bruising. SKIN: No acne or rash. MUSCULOSKELETAL: Positive arthritis. NEUROLOGIC: Positive syncopal episode. PSYCH: No anxiety or depression. All other systems reviewed and negative.   PHYSICAL EXAMINATION:   GENERAL: [name] patient is awake, alert, and oriented x3 not in acute distress.   VITAL SIGNS: Afebrile, pulse 56, blood pressure 199/100, sats 99% on room air.   HEENT: Atraumatic, normocephalic. Pupils equal, round, and reactive to light and accommodation. Extraocular movements intact. Oral mucosa is moist. [name] patient's ear exam shows impacted with wax/cerumen.   NECK: Supple. No JVD. No carotid bruit.   RESPIRATORY: Clear to auscultation bilaterally. No rales, rhonchi, respiratory distress, or labored breathing.   CARDIOVASCULAR: Both [name] heart sounds are normal. Rate, rhythm is regular. PMI not lateralized. Chest nontender.   EXTREMITIES: Good pedal pulses. Good femoral pulses. No lower extremity edema.   ABDOMEN: Soft, benign, nontender. No organomegaly. Positive bowel sounds.   NEUROLOGIC: Grossly intact cranial nerves II through XII. No motor or sensory deficits.   PSYCH: [name] patient is awake, alert, and oriented x3.   LABORATORY, DIAGNOSTIC, AND RADIOLOGICAL DATA: EKG shows normal sinus rhythm. Q waves in inferior leads, appears old. Urinalysis negative for urinary tract infection. CT of [name] head no acute intracranial abnormality. CBC and comprehensive metabolic panel within normal limits. First set of cardiac enzymes negative.   ASSESSMENT: 74 year old [name] Browning with:  1.  Near syncope/syncope x2 suspected from uncontrolled hypertension. [name] patient's family reports blood pressure at home in [name] 200's. [name] patient remains  noncompliant to medications, has not taken his medications for [name] last three months along with eating salty food on a regular basis. He had normal Myoview in 2011 and 2007 with EF of 60 to 65%. Normal carotid Doppler in 2011 except mild plaque in [name] carotids without any significant stenosis. This was done in June 2011 as part of syncopal work-up.  2. Accelerated hypertension. [name] patient's blood pressure is 199/100 secondary to noncompliance with meds and diet.  3. Impaired hearing due to bilateral ear wax.  4. Hyperlipidemia. He will resume Pravachol.   PLAN:  1. Admit patient for overnight observation on off-unit tele floor.  2. FULL CODE.  3. Will start patient on hydrochlorothiazide/lisinopril 1 tablet stat along with p.r.n. hydralazine.  4. Will start patient on aspirin and resume his Pravachol.  5. Adjust home medications according to blood pressure.  6. [name] patient was advised lifestyle changes and importance of taking medications. He was also advised to keep a log of blood pressure readings at home and follow-up with primary care physician on a regular basis. Above also was relayed to [name] patient's family members who were present in [name]. Questions were answered.  7. Further work-up according to [name] patient's clinical course.   Hospital admission plan was discussed.   TIME SPENT: 55 minutes.   ____________________________ Hart Rochester Posey Pronto, MD sap:drc D: 01/25/2011 17:03:41 ET T: 01/25/2011 17:22:32 ET JOB#: [id number]  cc: Bruna Dills A. Posey Pronto, MD, <Dictator> Meindert A. Brunetta Genera, MD Ilda Basset MD ELECTRONICALLY SIGNED 02/04/2011 7:28

## 2014-05-07 DIAGNOSIS — G542 Cervical root disorders, not elsewhere classified: Secondary | ICD-10-CM | POA: Diagnosis not present

## 2014-05-07 DIAGNOSIS — J449 Chronic obstructive pulmonary disease, unspecified: Secondary | ICD-10-CM | POA: Diagnosis not present

## 2014-05-07 DIAGNOSIS — M545 Low back pain: Secondary | ICD-10-CM | POA: Diagnosis not present

## 2014-05-07 DIAGNOSIS — F419 Anxiety disorder, unspecified: Secondary | ICD-10-CM | POA: Diagnosis not present

## 2014-05-07 DIAGNOSIS — M542 Cervicalgia: Secondary | ICD-10-CM | POA: Diagnosis not present

## 2014-05-07 DIAGNOSIS — M129 Arthropathy, unspecified: Secondary | ICD-10-CM | POA: Diagnosis not present

## 2014-05-08 DIAGNOSIS — M5136 Other intervertebral disc degeneration, lumbar region: Secondary | ICD-10-CM | POA: Diagnosis not present

## 2014-05-08 DIAGNOSIS — M5137 Other intervertebral disc degeneration, lumbosacral region: Secondary | ICD-10-CM | POA: Diagnosis not present

## 2014-05-08 DIAGNOSIS — M5440 Lumbago with sciatica, unspecified side: Secondary | ICD-10-CM | POA: Diagnosis not present

## 2014-05-08 DIAGNOSIS — M5032 Other cervical disc degeneration, mid-cervical region: Secondary | ICD-10-CM | POA: Diagnosis not present

## 2014-05-08 DIAGNOSIS — S32009A Unspecified fracture of unspecified lumbar vertebra, initial encounter for closed fracture: Secondary | ICD-10-CM | POA: Diagnosis not present

## 2014-05-16 DIAGNOSIS — E780 Pure hypercholesterolemia: Secondary | ICD-10-CM | POA: Diagnosis not present

## 2014-05-16 DIAGNOSIS — M810 Age-related osteoporosis without current pathological fracture: Secondary | ICD-10-CM | POA: Diagnosis not present

## 2014-05-16 DIAGNOSIS — R7989 Other specified abnormal findings of blood chemistry: Secondary | ICD-10-CM | POA: Diagnosis not present

## 2014-05-16 DIAGNOSIS — E041 Nontoxic single thyroid nodule: Secondary | ICD-10-CM | POA: Diagnosis not present

## 2014-05-16 DIAGNOSIS — R1013 Epigastric pain: Secondary | ICD-10-CM | POA: Diagnosis not present

## 2014-05-16 DIAGNOSIS — E559 Vitamin D deficiency, unspecified: Secondary | ICD-10-CM | POA: Diagnosis not present

## 2014-05-16 DIAGNOSIS — J069 Acute upper respiratory infection, unspecified: Secondary | ICD-10-CM | POA: Diagnosis not present

## 2014-05-16 DIAGNOSIS — I517 Cardiomegaly: Secondary | ICD-10-CM | POA: Diagnosis not present

## 2014-05-16 DIAGNOSIS — R5383 Other fatigue: Secondary | ICD-10-CM | POA: Diagnosis not present

## 2014-05-16 DIAGNOSIS — G542 Cervical root disorders, not elsewhere classified: Secondary | ICD-10-CM | POA: Diagnosis not present

## 2014-05-16 DIAGNOSIS — J449 Chronic obstructive pulmonary disease, unspecified: Secondary | ICD-10-CM | POA: Diagnosis not present

## 2014-05-16 DIAGNOSIS — M545 Low back pain: Secondary | ICD-10-CM | POA: Diagnosis not present

## 2014-05-18 ENCOUNTER — Emergency Department: Payer: Medicare Other

## 2014-05-18 ENCOUNTER — Inpatient Hospital Stay
Admission: EM | Admit: 2014-05-18 | Discharge: 2014-05-20 | DRG: 069 | Disposition: A | Discharge status: Home / Self Care | Payer: Medicare Other | Attending: Internal Medicine | Admitting: Internal Medicine

## 2014-05-18 ENCOUNTER — Encounter: Payer: Self-pay | Admitting: *Deleted

## 2014-05-18 DIAGNOSIS — G459 Transient cerebral ischemic attack, unspecified: Principal | ICD-10-CM | POA: Diagnosis present

## 2014-05-18 DIAGNOSIS — Z8673 Personal history of transient ischemic attack (TIA), and cerebral infarction without residual deficits: Secondary | ICD-10-CM | POA: Diagnosis not present

## 2014-05-18 DIAGNOSIS — Z79899 Other long term (current) drug therapy: Secondary | ICD-10-CM | POA: Diagnosis not present

## 2014-05-18 DIAGNOSIS — R4781 Slurred speech: Secondary | ICD-10-CM | POA: Diagnosis not present

## 2014-05-18 DIAGNOSIS — I361 Nonrheumatic tricuspid (valve) insufficiency: Secondary | ICD-10-CM | POA: Diagnosis not present

## 2014-05-18 DIAGNOSIS — Z7951 Long term (current) use of inhaled steroids: Secondary | ICD-10-CM | POA: Diagnosis not present

## 2014-05-18 DIAGNOSIS — I209 Angina pectoris, unspecified: Secondary | ICD-10-CM | POA: Diagnosis present

## 2014-05-18 DIAGNOSIS — F1010 Alcohol abuse, uncomplicated: Secondary | ICD-10-CM | POA: Diagnosis present

## 2014-05-18 DIAGNOSIS — Z8249 Family history of ischemic heart disease and other diseases of the circulatory system: Secondary | ICD-10-CM | POA: Diagnosis not present

## 2014-05-18 DIAGNOSIS — I639 Cerebral infarction, unspecified: Secondary | ICD-10-CM | POA: Diagnosis not present

## 2014-05-18 DIAGNOSIS — J449 Chronic obstructive pulmonary disease, unspecified: Secondary | ICD-10-CM | POA: Diagnosis not present

## 2014-05-18 DIAGNOSIS — I10 Essential (primary) hypertension: Secondary | ICD-10-CM | POA: Diagnosis not present

## 2014-05-18 DIAGNOSIS — E860 Dehydration: Secondary | ICD-10-CM | POA: Diagnosis present

## 2014-05-18 DIAGNOSIS — Z87891 Personal history of nicotine dependence: Secondary | ICD-10-CM

## 2014-05-18 DIAGNOSIS — R079 Chest pain, unspecified: Secondary | ICD-10-CM

## 2014-05-18 DIAGNOSIS — Z823 Family history of stroke: Secondary | ICD-10-CM

## 2014-05-18 DIAGNOSIS — I6529 Occlusion and stenosis of unspecified carotid artery: Secondary | ICD-10-CM | POA: Diagnosis present

## 2014-05-18 DIAGNOSIS — I208 Other forms of angina pectoris: Secondary | ICD-10-CM | POA: Diagnosis not present

## 2014-05-18 DIAGNOSIS — I6523 Occlusion and stenosis of bilateral carotid arteries: Secondary | ICD-10-CM | POA: Diagnosis not present

## 2014-05-18 DIAGNOSIS — E785 Hyperlipidemia, unspecified: Secondary | ICD-10-CM | POA: Diagnosis present

## 2014-05-18 DIAGNOSIS — R29810 Facial weakness: Secondary | ICD-10-CM | POA: Diagnosis not present

## 2014-05-18 HISTORY — DX: Chronic obstructive pulmonary disease, unspecified: J44.9

## 2014-05-18 HISTORY — DX: Syncope and collapse: R55

## 2014-05-18 HISTORY — DX: Cerebral infarction, unspecified: I63.9

## 2014-05-18 HISTORY — DX: Hyperlipidemia, unspecified: E78.5

## 2014-05-18 HISTORY — DX: Encounter for observation for other suspected diseases and conditions ruled out: Z03.89

## 2014-05-18 HISTORY — DX: Essential (primary) hypertension: I10

## 2014-05-18 HISTORY — DX: Reserved for inherently not codable concepts without codable children: IMO0001

## 2014-05-18 LAB — DIFFERENTIAL
Basophils Absolute: 0 10*3/uL (ref 0–0.1)
Basophils Relative: 1 %
EOS ABS: 0.2 10*3/uL (ref 0–0.7)
Eosinophils Relative: 3 %
Lymphocytes Relative: 23 %
Lymphs Abs: 1.5 10*3/uL (ref 1.0–3.6)
MONO ABS: 0.6 10*3/uL (ref 0.2–1.0)
MONOS PCT: 9 %
NEUTROS ABS: 4.2 10*3/uL (ref 1.4–6.5)
Neutrophils Relative %: 64 %

## 2014-05-18 LAB — COMPREHENSIVE METABOLIC PANEL
ALT: 19 U/L (ref 17–63)
AST: 27 U/L (ref 15–41)
Albumin: 3.8 g/dL (ref 3.5–5.0)
Alkaline Phosphatase: 118 U/L (ref 38–126)
Anion gap: 6 (ref 5–15)
BUN: 27 mg/dL — ABNORMAL HIGH (ref 6–20)
CALCIUM: 9.1 mg/dL (ref 8.9–10.3)
CHLORIDE: 110 mmol/L (ref 101–111)
CO2: 27 mmol/L (ref 22–32)
Creatinine, Ser: 1.02 mg/dL (ref 0.61–1.24)
GFR calc Af Amer: >60 mL/min (ref >60)
GLUCOSE: 91 mg/dL (ref 65–99)
POTASSIUM: 3.6 mmol/L (ref 3.5–5.1)
Sodium: 143 mmol/L (ref 135–145)
Total Bilirubin: 0.7 mg/dL (ref 0.3–1.2)
Total Protein: 7.1 g/dL (ref 6.5–8.1)

## 2014-05-18 LAB — CBC
HCT: 40.1 % (ref 40.0–52.0)
HEMATOCRIT: 38.9 % — AB (ref 40.0–52.0)
Hemoglobin: 13.3 g/dL (ref 13.0–18.0)
Hemoglobin: 13.2 g/dL (ref 13.0–18.0)
MCH: 29.8 pg (ref 26.0–34.0)
MCH: 30.4 pg (ref 26.0–34.0)
MCHC: 33.1 g/dL (ref 32.0–36.0)
MCHC: 33.9 g/dL (ref 32.0–36.0)
MCV: 90.2 fL (ref 80.0–100.0)
MCV: 89.8 fL (ref 80.0–100.0)
PLATELETS: 183 10*3/uL (ref 150–440)
Platelets: 181 10*3/uL (ref 150–440)
RBC: 4.45 MIL/uL (ref 4.40–5.90)
RBC: 4.34 MIL/uL — AB (ref 4.40–5.90)
RDW: 13.3 % (ref 11.5–14.5)
RDW: 13.3 % (ref 11.5–14.5)
WBC: 6.4 10*3/uL (ref 3.8–10.6)
WBC: 6.7 10*3/uL (ref 3.8–10.6)

## 2014-05-18 LAB — CREATININE, SERUM
CREATININE: 0.93 mg/dL (ref 0.61–1.24)
GFR calc Af Amer: >60 mL/min (ref >60)

## 2014-05-18 LAB — TROPONIN I: Troponin I: <0.03 ng/mL (ref <0.031)

## 2014-05-18 LAB — APTT: aPTT: 29 seconds (ref 24–36)

## 2014-05-18 LAB — PROTIME-INR
INR: 0.94
PROTHROMBIN TIME: 12.8 s (ref 11.4–15.0)

## 2014-05-18 MED ORDER — SENNOSIDES-DOCUSATE SODIUM 8.6-50 MG PO TABS: 1.0000 | ORAL_TABLET | Freq: Every evening | ORAL | Status: DC | PRN

## 2014-05-18 MED ORDER — ASPIRIN 81 MG PO CHEW
CHEWABLE_TABLET | ORAL | Status: AC
  Administered 2014-05-18: 324 mg via ORAL
  Filled 2014-05-18: qty 4

## 2014-05-18 MED ORDER — CYCLOBENZAPRINE HCL 10 MG PO TABS
10.0000 mg | ORAL_TABLET | Freq: Three times a day (TID) | ORAL | Status: DC | PRN
  Administered 2014-05-18 – 2014-05-19 (×2): 10 mg via ORAL
  Filled 2014-05-18 (×2): qty 1

## 2014-05-18 MED ORDER — SIMVASTATIN 20 MG PO TABS
20.0000 mg | ORAL_TABLET | Freq: Every day | ORAL | Status: DC
  Administered 2014-05-18 – 2014-05-19 (×2): 20 mg via ORAL
  Filled 2014-05-18 (×2): qty 1

## 2014-05-18 MED ORDER — HEPARIN SODIUM (PORCINE) 5000 UNIT/ML IJ SOLN
5000.0000 [IU] | Freq: Three times a day (TID) | INTRAMUSCULAR | Status: DC
  Administered 2014-05-18 – 2014-05-20 (×5): 5000 [IU] via SUBCUTANEOUS
  Filled 2014-05-18 (×5): qty 1

## 2014-05-18 MED ORDER — ASPIRIN 325 MG PO TABS
325.0000 mg | ORAL_TABLET | Freq: Every day | ORAL | Status: DC
  Administered 2014-05-19 – 2014-05-20 (×2): 325 mg via ORAL
  Filled 2014-05-18 (×2): qty 1

## 2014-05-18 MED ORDER — ALPRAZOLAM 0.5 MG PO TABS: 0.5000 mg | ORAL_TABLET | Freq: Two times a day (BID) | ORAL | Status: DC | PRN

## 2014-05-18 MED ORDER — STROKE: EARLY STAGES OF RECOVERY BOOK
Freq: Once | Status: DC
  Filled 2014-05-18: qty 1

## 2014-05-18 MED ORDER — ALBUTEROL SULFATE (2.5 MG/3ML) 0.083% IN NEBU: 2.5000 mg | INHALATION_SOLUTION | Freq: Four times a day (QID) | RESPIRATORY_TRACT | Status: DC | PRN

## 2014-05-18 MED ORDER — SODIUM CHLORIDE 0.9 % IV SOLN
INTRAVENOUS | Status: DC
  Administered 2014-05-18 – 2014-05-19 (×2): via INTRAVENOUS

## 2014-05-18 MED ORDER — ASPIRIN 300 MG RE SUPP: 300.0000 mg | Freq: Every day | RECTAL | Status: DC

## 2014-05-18 MED ORDER — ASPIRIN 81 MG PO CHEW
324.0000 mg | CHEWABLE_TABLET | Freq: Once | ORAL | Status: AC
  Administered 2014-05-18: 324 mg via ORAL

## 2014-05-18 NOTE — ED Provider Notes (Signed)
[facility] Emergency Department Provider Note    ____________________________________________  Time seen: 5462  I have reviewed the triage vital signs and the nursing notes.   HISTORY  Chief Complaint Cerebrovascular Accident and Chest Pain   History limited by: Not Limited   HPI [name] Browning is a 74 y.o. male who presents to the emergency department today because ofCerner for right sided facial drooping and slurred speech. His symptoms started 45 minutes ago. Daughter states she thinks that they are getting slightly better. They occurred acutely. The patient himself has been complaining of roughly 1 week of sharp chest pains as well. These pains come and go. He has not identified any alleviating or eliciting behavior. He denies any recent trauma to his head. Denies any fevers.  No past medical history on file.  There are no active problems to display for this patient.   No past surgical history on file.  No current outpatient prescriptions on file.  Allergies Review of patient's allergies indicates no known allergies.  No family history on file.  Social History History  Substance Use Topics  . Smoking status: Not on file  . Smokeless tobacco: Not on file  . Alcohol Use: Not on file    Review of Systems  Constitutional: Negative for fever. Cardiovascular: Positive for chest pain. Respiratory: Negative for shortness of breath. Gastrointestinal: Negative for abdominal pain, vomiting and diarrhea. Genitourinary: Negative for dysuria. Musculoskeletal: Negative for back pain. Skin: Negative for rash. Neurological: Negative for headaches, focal weakness or numbness. Right facial drooping. Slurred speech.  10-point ROS otherwise negative.  ____________________________________________   PHYSICAL EXAM:  VITAL SIGNS: ED Triage Vitals  Enc Vitals Group     BP 05/18/14 1402 185/83 mmHg     Pulse Rate 05/18/14 1402 79     Resp 05/18/14  1402 16     Temp 05/18/14 1402 97.8 F (36.6 C)     Temp Source 05/18/14 1402 Oral     SpO2 05/18/14 1402 96 %     Weight 05/18/14 1402 178 lb (80.74 kg)     Height 05/18/14 1402 6\' 1"  (1.854 m)     Head Cir --      Peak Flow --      Pain Score 05/18/14 1403 6   Constitutional: Alert and oriented. Well appearing and in no distress. Eyes: Conjunctivae are normal. PERRL. Normal extraocular movements. ENT   Head: Normocephalic and atraumatic.   Nose: No congestion/rhinnorhea.   Mouth/Throat: Mucous membranes are moist.   Neck: No stridor. Hematological/Lymphatic/Immunilogical: No cervical lymphadenopathy. Cardiovascular: Normal rate, regular rhythm.  No murmurs, rubs, or gallops. Respiratory: Normal respiratory effort without tachypnea nor retractions. Breath sounds are clear and equal bilaterally. No wheezes/rales/rhonchi. Gastrointestinal: Soft and nontender. No distention.  Genitourinary: Deferred Musculoskeletal: Normal range of motion in all extremities. No joint effusions.  No lower extremity tenderness nor edema. Neurologic:  Normal speech and language. No slurred speech appreciated. Patient does have mild right tongue deviation. Face is symmetric. Sensation of bilateral trigeminal nerve intact. Symmetric palatal elevation. Strength 5 out of 5 in upper and lower extremities. Sensation intact in all extremities. NIH stroke scale of 0 Skin:  Skin is warm, dry and intact. No rash noted. Psychiatric: Mood and affect are normal. Speech and behavior are normal. Patient exhibits appropriate insight and judgment.  ____________________________________________    LABS (pertinent positives/negatives)  Labs Reviewed  COMPREHENSIVE METABOLIC PANEL - Abnormal; Notable for the following:    BUN 27 (*)  All other components within normal limits  PROTIME-INR  APTT  CBC  DIFFERENTIAL  TROPONIN I  I-STAT CHEM 8, ED  CBG MONITORING, ED       ____________________________________________   EKG  EKG Time: 1402 Rate: 75 Rhythm: Normal sinus rhythm Axis: Normal Intervals: QTC 399 QRS: Normal ST changes: No ST elevation    ____________________________________________    RADIOLOGY   IMPRESSION: 1. No acute abnormality. 2. Stable minimal diffuse cortical atrophy and minimal chronic small vessel white matter ischemic changes in both cerebral hemispheres. These results were called by telephone at the time of interpretation on 05/18/2014 at 2:25 pm to Dr. Nance Pear , who verbally acknowledged these results.  ____________________________________________   PROCEDURES  Procedure(s) performed: None  Critical Care performed: No  ____________________________________________   INITIAL IMPRESSION / ASSESSMENT AND PLAN / ED COURSE  Pertinent labs & imaging results that were available during my care of the patient were reviewed by me and considered in my medical decision making (see chart for details).  Patient is a 74 year old male brought in because of concerns for slurred speech and right facial droop. On my exam the only focal neuro deficit was some slight right tongue deviation. Additionally patient's family states that they think symptoms are getting better. Furthermore I did discussed TPA with the family and they said they would not want this medication.   ----------------------------------------- 3:55 PM on 05/18/2014 -----------------------------------------  Family states patient speech continues to get better. Blood work without any obvious etiology of the symptoms. Plan to admit patient for further stroke/TIA workup.  ____________________________________________   FINAL CLINICAL IMPRESSION(S) / ED DIAGNOSES  Final diagnoses:  Slurred speech     Nance Pear, MD 05/18/14 1556

## 2014-05-18 NOTE — ED Notes (Signed)
Pt comes into the ED via daughter with c/o sudden onset not feeling right, confused, slurred speech with right sided facial droop, right arm weakness with chest pain that started 52min PAT

## 2014-05-18 NOTE — H&P (Signed)
[place] [facility] [place] NAME: [name] Browning    MR#:  [id number]  DATE OF BIRTH:  01/22/1940  DATE OF ADMISSION:  05/18/2014  PRIMARY CARE PHYSICIAN: Lorelee Market, MD   REQUESTING/REFERRING PHYSICIAN: Dr. Archie Balboa.  CHIEF COMPLAINT:   Chief Complaint  Patient presents with  . Cerebrovascular Accident  . Chest Pain    HISTORY OF PRESENT ILLNESS:  [name] Browning  is a 74 y.o. male with a known history of hypertension, stroke and COPD. The patient was noticed to be confused, slurred speech and the right facial droop this morning. Since the patient was sent to the ED for further evaluation 45 minutes after the episode. Patient's slurred speech and facial droop were better the ED, Dr. Archie Balboa suspect the patient has CVA and discussed with the patient's wife and daughter about TPA treatment. They don't want TPA treatment. The patient was treated with aspirin 324 mg one dosing ED. Patient also complains of chest pain on and off for a few days. He denies any shortness of breath, cough, orthopnea, nocturnal dyspnea, or leg edema. Patient's daughter mentioned that patient has carotid artery stenosis more than 75% recently, but she doesn't know which side.  PAST MEDICAL HISTORY:   Past Medical History  Diagnosis Date  . COPD (chronic obstructive pulmonary disease)   . Hypertension     PAST SURGICAL HISTORY:  History reviewed. No pertinent past surgical history.  SOCIAL HISTORY:   History  Substance Use Topics  . Smoking status: Former Smoker    Types: Cigarettes  . Smokeless tobacco: Not on file  . Alcohol Use: 0.6 oz/week    1 Cans of beer per week    FAMILY HISTORY:  Hypertension, CAD and stroke.  DRUG ALLERGIES:  No Known Allergies  REVIEW OF SYSTEMS:  CONSTITUTIONAL: No fever, fatigue or weakness.  EYES: No blurred or double vision.  EARS, NOSE, AND THROAT: No tinnitus or ear pain.  RESPIRATORY: No cough, shortness of  breath, wheezing or hemoptysis.  CARDIOVASCULAR: Positive for chest pain, no orthopnea, edema.  GASTROINTESTINAL: No nausea, vomiting, diarrhea or abdominal pain.  GENITOURINARY: No dysuria, hematuria.  ENDOCRINE: No polyuria, nocturia,  HEMATOLOGY: No anemia, easy bruising or bleeding SKIN: No rash or lesion. MUSCULOSKELETAL: No joint pain or arthritis.   NEUROLOGIC: No tingling, numbness, weakness. Positive for slurred speech and facial droop.  PSYCHIATRY: No anxiety or depression.   MEDICATIONS AT HOME:   Prior to Admission medications   Medication Sig Start Date End Date Taking? Authorizing Provider  albuterol (PROVENTIL HFA;VENTOLIN HFA) 108 (90 BASE) MCG/ACT inhaler Inhale 1 puff into the lungs every 6 (six) hours as needed for wheezing or shortness of breath.   Yes Historical Provider, MD  ALPRAZolam Duanne Moron) 0.5 MG tablet Take 0.5 mg by mouth 2 (two) times daily as needed for anxiety.   Yes Historical Provider, MD  celecoxib (CELEBREX) 200 MG capsule Take 200 mg by mouth 2 (two) times daily.   Yes Historical Provider, MD  cyclobenzaprine (FLEXERIL) 10 MG tablet Take 10 mg by mouth 3 (three) times daily as needed for muscle spasms.   Yes Historical Provider, MD  simvastatin (ZOCOR) 20 MG tablet Take 20 mg by mouth daily.   Yes Historical Provider, MD      VITAL SIGNS:  Blood pressure 190/72, pulse 58, temperature 97.5 F (36.4 C), temperature source Oral, resp. rate 19, height 6\' 1"  (1.854 m), weight 80.74 kg (178 lb), SpO2 93 %.  PHYSICAL  EXAMINATION:  GENERAL:  74 y.o.-year-old patient lying in the bed with no acute distress.  EYES: Pupils equal, round, reactive to light and accommodation. No scleral icterus. Extraocular muscles intact.  HEENT: Head atraumatic, normocephalic. Oropharynx and nasopharynx clear.  NECK:  Supple, no jugular venous distention. No thyroid enlargement, no tenderness.  LUNGS: Normal breath sounds bilaterally, no wheezing, rales,rhonchi or crepitation.  No use of accessory muscles of respiration.  CARDIOVASCULAR: S1, S2 normal. No murmurs, rubs, or gallops.  ABDOMEN: Soft, nontender, nondistended. Bowel sounds present. No organomegaly or mass.  EXTREMITIES: No pedal edema, cyanosis, or clubbing.  NEUROLOGIC: Mild facial droop on the right side. Muscle strength 5/5 in all extremities. Sensation intact. Gait not checked.  PSYCHIATRIC: The patient is alert and oriented x 3.  SKIN: No obvious rash, lesion, or ulcer.   LABORATORY PANEL:   CBC  Recent Labs Lab 05/18/14 1402  WBC 6.4  HGB 13.3  HCT 40.1  PLT 181   ------------------------------------------------------------------------------------------------------------------  Chemistries   Recent Labs Lab 05/18/14 1402  NA 143  K 3.6  CL 110  CO2 27  GLUCOSE 91  BUN 27*  CREATININE 1.02  CALCIUM 9.1  AST 27  ALT 19  ALKPHOS 118  BILITOT 0.7   ------------------------------------------------------------------------------------------------------------------  Cardiac Enzymes  Recent Labs Lab 05/18/14 1402  TROPONINI <0.03   ------------------------------------------------------------------------------------------------------------------  RADIOLOGY:  Ct Head (brain) Wo Contrast  05/18/2014   CLINICAL DATA:  Code stroke.  Right facial droop and slurred speech.  EXAM: CT HEAD WITHOUT CONTRAST  TECHNIQUE: Contiguous axial images were obtained from the base of the skull through the vertex without intravenous contrast.  COMPARISON:  08/10/2013 and brain MR dated 08/12/2013.  FINDINGS: Mildly enlarged subarachnoid spaces. Normal size and position of the ventricles. Minimal patchy white matter low density in both cerebral hemispheres. No intracranial hemorrhage, mass lesion or CT evidence of acute infarction. Unremarkable bones and included paranasal sinuses.  IMPRESSION: 1. No acute abnormality. 2. Stable minimal diffuse cortical atrophy and minimal chronic small vessel white  matter ischemic changes in both cerebral hemispheres. These results were called by telephone at the time of interpretation on 05/18/2014 at 2:25 pm to Dr. Nance Pear , who verbally acknowledged these results.   Electronically Signed   By: Claudie Revering M.D.   On: 05/18/2014 14:30    EKG:   Orders placed or performed during the hospital encounter of 05/18/14  . EKG  . EKG    IMPRESSION AND PLAN:   Acute CVA. Patient will be admitted to telemetry floor. I will continue aspirin and Zocor. I will get carotid duplex, echocardiogram, and brain MRI. I will start neuro check. Hypertension.continue patient patient hypertension medication. COPD, stable. Possible carotid artery stenosis, follow-up carotid duplex.    All the records are reviewed and case discussed with ED provider. Management plans discussed with the patient, family and they are in agreement.  CODE STATUS: Patient wants full code.   TOTAL TIME TAKING CARE OF THIS PATIENT: 55minutes.    Demetrios Loll M.D on 05/18/2014 at 6:36 PM  Between 7am to 6pm - Pager - [phone]  After 6pm go to [email or web address] - password [email or web address] [facility]  Office  [phone]  CC: Primary care physician; Lorelee Market, MD

## 2014-05-19 ENCOUNTER — Encounter: Payer: Self-pay | Admitting: Physician Assistant

## 2014-05-19 ENCOUNTER — Inpatient Hospital Stay: Payer: Medicare Other

## 2014-05-19 ENCOUNTER — Inpatient Hospital Stay (HOSPITAL_COMMUNITY): Payer: Medicare Other

## 2014-05-19 DIAGNOSIS — I361 Nonrheumatic tricuspid (valve) insufficiency: Secondary | ICD-10-CM

## 2014-05-19 DIAGNOSIS — R079 Chest pain, unspecified: Secondary | ICD-10-CM

## 2014-05-19 DIAGNOSIS — I10 Essential (primary) hypertension: Secondary | ICD-10-CM

## 2014-05-19 LAB — TROPONIN I
Troponin I: <0.03 ng/mL (ref <0.031)
Troponin I: <0.03 ng/mL (ref <0.031)
Troponin I: <0.03 ng/mL (ref <0.031)

## 2014-05-19 LAB — LIPID PANEL
Cholesterol: 198 mg/dL (ref 0–200)
HDL: 47 mg/dL (ref >40)
LDL Cholesterol: 139 mg/dL — ABNORMAL HIGH (ref 0–99)
TRIGLYCERIDES: 59 mg/dL (ref <150)
Total CHOL/HDL Ratio: 4.2 RATIO
VLDL: 12 mg/dL (ref 0–40)

## 2014-05-19 LAB — HEMOGLOBIN A1C: Hgb A1c MFr Bld: 5.4 % (ref 4.0–6.0)

## 2014-05-19 MED ORDER — LISINOPRIL 5 MG PO TABS
5.0000 mg | ORAL_TABLET | Freq: Every day | ORAL | Status: DC
  Administered 2014-05-19 – 2014-05-20 (×2): 5 mg via ORAL
  Filled 2014-05-19 (×2): qty 1

## 2014-05-19 MED ORDER — ATORVASTATIN CALCIUM 20 MG PO TABS
80.0000 mg | ORAL_TABLET | Freq: Every day | ORAL | Status: DC
  Administered 2014-05-19: 80 mg via ORAL
  Filled 2014-05-19: qty 4

## 2014-05-19 NOTE — Progress Notes (Signed)
[name] Browning at [place] NAME: [name] Browning    MR#:  [id number]  DATE OF BIRTH:  10-25-1940  SUBJECTIVE:  CHIEF COMPLAINT:   Chief Complaint  Patient presents with  . Cerebrovascular Accident  . Chest Pain  some speech problem.  REVIEW OF SYSTEMS:  CONSTITUTIONAL: No fever, fatigue or weakness.  EYES: No blurred or double vision.  EARS, NOSE, AND THROAT: No tinnitus or ear pain.  RESPIRATORY: No cough, shortness of breath, wheezing or hemoptysis.  CARDIOVASCULAR: No chest pain, orthopnea, edema.  GASTROINTESTINAL: No nausea, vomiting, diarrhea or abdominal pain.  GENITOURINARY: No dysuria, hematuria.  ENDOCRINE: No polyuria, nocturia,  HEMATOLOGY: No anemia, easy bruising or bleeding SKIN: No rash or lesion. MUSCULOSKELETAL: No joint pain or arthritis.   NEUROLOGIC: No tingling, numbness, weakness. Speech is slurred.  PSYCHIATRY: No anxiety or depression.   ROS  DRUG ALLERGIES:  No Known Allergies  VITALS:  Blood pressure 156/74, pulse 59, temperature 97.9 F (36.6 C), temperature source Oral, resp. rate 17, height 6\' 1"  (1.854 m), weight 78.835 kg (173 lb 12.8 oz), SpO2 93 %.  PHYSICAL EXAMINATION:  GENERAL:  74 y.o.-year-old patient lying in the bed with no acute distress.  EYES: Pupils equal, round, reactive to light and accommodation. No scleral icterus. Extraocular muscles intact.  HEENT: Head atraumatic, normocephalic. Oropharynx and nasopharynx clear.  NECK:  Supple, no jugular venous distention. No thyroid enlargement, no tenderness.  LUNGS: Normal breath sounds bilaterally, no wheezing, rales,rhonchi or crepitation. No use of accessory muscles of respiration.  CARDIOVASCULAR: S1, S2 normal. No murmurs, rubs, or gallops.  ABDOMEN: Soft, nontender, nondistended. Bowel sounds present. No organomegaly or mass.  EXTREMITIES: No pedal edema, cyanosis, or clubbing.  NEUROLOGIC: Cranial nerves II through XII are intact.  Muscle strength 5/5 in all extremities. Sensation intact. Gait not checked.  PSYCHIATRIC: The patient is alert and oriented x 3.  SKIN: No obvious rash, lesion, or ulcer.   Physical Exam LABORATORY PANEL:   CBC  Recent Labs Lab 05/18/14 1856  WBC 6.7  HGB 13.2  HCT 38.9*  PLT 183   ------------------------------------------------------------------------------------------------------------------  Chemistries   Recent Labs Lab 05/18/14 1402 05/18/14 1856  NA 143  --   K 3.6  --   CL 110  --   CO2 27  --   GLUCOSE 91  --   BUN 27*  --   CREATININE 1.02 0.93  CALCIUM 9.1  --   AST 27  --   ALT 19  --   ALKPHOS 118  --   BILITOT 0.7  --    ------------------------------------------------------------------------------------------------------------------  Cardiac Enzymes  Recent Labs Lab 05/18/14 1402  TROPONINI <0.03   ------------------------------------------------------------------------------------------------------------------  RADIOLOGY:  Ct Head (brain) Wo Contrast  05/18/2014   CLINICAL DATA:  Code stroke.  Right facial droop and slurred speech.  EXAM: CT HEAD WITHOUT CONTRAST  TECHNIQUE: Contiguous axial images were obtained from the base of the skull through the vertex without intravenous contrast.  COMPARISON:  08/10/2013 and brain MR dated 08/12/2013.  FINDINGS: Mildly enlarged subarachnoid spaces. Normal size and position of the ventricles. Minimal patchy white matter low density in both cerebral hemispheres. No intracranial hemorrhage, mass lesion or CT evidence of acute infarction. Unremarkable bones and included paranasal sinuses.  IMPRESSION: 1. No acute abnormality. 2. Stable minimal diffuse cortical atrophy and minimal chronic small vessel white matter ischemic changes in both cerebral hemispheres. These results were called by telephone at the time of interpretation  on 05/18/2014 at 2:25 pm to Dr. Nance Pear , who verbally acknowledged these results.    Electronically Signed   By: Claudie Revering M.D.   On: 05/18/2014 14:30   [name]  05/19/2014   CLINICAL DATA:  History of hypertension. Stroke. COPD. Confusion. Slurred speech. Right facial droop. Symptoms began 05/18/2014  EXAM: MRI HEAD WITHOUT CONTRAST  TECHNIQUE: Multiplanar, multiecho pulse sequences of the brain and surrounding structures were obtained without intravenous contrast.  COMPARISON:  Head CT 05/18/2014.  MRI 08/12/2013  FINDINGS: Diffusion imaging does not show any acute or subacute infarction. There are moderate chronic small-vessel ischemic changes affecting the pons. No focal cerebellar insult. The cerebral hemispheres show mild chronic small-vessel ischemic changes within the white matter. No cortical or large vessel territory infarction. Dilated perivascular space at the base of the brain on the right. No mass lesion, hemorrhage, hydrocephalus or extra-axial collection. No pituitary mass. No inflammatory sinus disease. No skull or skullbase lesion. Major vessels at the base of the brain show flow.  IMPRESSION: No acute insult. Chronic small-vessel ischemic changes affecting the pons in the cerebral hemispheric white matter.   Electronically Signed   By: Nelson Chimes M.D.   On: 05/19/2014 10:28     ASSESSMENT AND PLAN:    * Acute CVA. Monitor on telemetry floor. I will continue aspirin and Zocor.  get carotid duplex, echocardiogram, negative brain MRI.    Will get PT eval.  * C/o anginal type chest pain   monitpor on tele, serial troponin, Echo.   Get cardio eval. * Hypertension.continue patient patient hypertension medication. * COPD, stable. * Possible carotid artery stenosis, ( as pt said- he was diagnosed with that)  follow-up carotid duplex.   All the records are reviewed and case discussed with Care Management/Social Workerr. Management plans discussed with the patient, family and they are in agreement.  CODE STATUS: full TOTAL TIME TAKING CARE OF THIS  PATIENT: 35 minutes.   POSSIBLE D/C IN 1-2 DAYS, DEPENDING ON CLINICAL CONDITION.   Vaughan Basta M.D on 05/19/2014 at 12:50 PM  Between 7am to 6pm - Pager - [phone]  After 6pm go to [email or web address] - password [email or web address] [facility]  Office  [phone]  CC: Primary care physician; Lorelee Market, MD

## 2014-05-19 NOTE — Care Management Note (Signed)
Case Management Note  Patient Details  Name: [name] Browning MRN: [id number] Date of Birth: Oct 07, 1940  Subjective/Objective:                    Action/Plan:   Expected Discharge Date:                  Expected Discharge Plan:     In-House Referral:     Discharge planning Services     Post Acute Care Choice:    Choice offered to:     DME Arranged:    DME Agency:     HH Arranged:    [place] Agency:     Status of Service:     Medicare Important Message Given:   YES Date Medicare IM Given:   05/19/14 Medicare IM give by:    Gloriann Riede, CM Date Additional Medicare IM Given:    Additional Medicare Important Message give by:     If discussed at [place] of Stay Meetings, dates discussed:    Additional Comments:  Marleta Lapierre A, RN 05/19/2014, 3:55 PM

## 2014-05-19 NOTE — Progress Notes (Addendum)
74yo [name] was admitted 05/18/14 per slurred speech and right side weakness both of which appear to be resolving at this time. [name] was seen by Speech Therapy today and ambulated with PT. He has missing teeth which contribute to his slurred speech. Denies having any assistive equipment at home. PCP=Dr Brunetta Genera. Pharmacy=[facility] in [place]. [name] reports that he resides with his ex-wife. Denies current home health providers. Anticipate discharge home with family. No PT follow-up recommended by [facility] today. Currently pending a Cardiology Consult per c/o chest pain.

## 2014-05-19 NOTE — Evaluation (Signed)
Clinical/Bedside Swallow Evaluation Patient Details  Name: [name] Browning MRN: [id number] Date of Birth: 01/05/1941  Today's Date: 05/19/2014 Time: SLP Start Time (ACUTE ONLY): 1259 SLP Stop Time (ACUTE ONLY): 1345 SLP Time Calculation (min) (ACUTE ONLY): 46 min  Past Medical History:  Past Medical History  Diagnosis Date  . COPD (chronic obstructive pulmonary disease)   . Hypertension   . Stroke    Past Surgical History: History reviewed. No pertinent past surgical history. HPI:   pt awake, alert and verbally conversive. Family in room; pt talking and laughing w/ Sons. Pt and Sons denied any speech/language deficits; endorsed pt's speech was baseline for him(missing dentition).   Assessment / Plan / Recommendation Clinical Impression  pt appeared to safely tolerate trials of thin liquids, purees and mech soft/regular boluses w/ no overt s/s of aspiration noted; no decline in respiratory status noted during/post trials. Oral phase appeared grossly wfl - pt required min. extra time for full bolus mastication/clearing sec. to missing dentition (this is baseline for him). Pt appears at reduced risk for aspiration.     Aspiration Risk   (reduced)    Diet Recommendation Age appropriate regular solids;Thin   Medication Administration: Whole meds with liquid (as tolerates) Compensations: Slow rate;Small sips/bites    Other  Recommendations Oral Care Recommendations: Oral care BID   Follow Up Recommendations       Frequency and Duration    1 week   Pertinent Vitals/Pain Denied at time of eval    SLP Swallow Goals     Swallow Study Prior Functional Status  Type of Home: House    General Date of Onset: 05/18/14 Type of Study: Bedside swallow evaluation Previous Swallow Assessment: none indicated Diet Prior to this Study: Regular;Thin liquids Temperature Spikes Noted: No Respiratory Status: Room air History of Recent Intubation: No Behavior/Cognition:  Alert;Cooperative;Pleasant mood Oral Cavity - Dentition: Missing dentition (most) Self-Feeding Abilities: Able to feed self Patient Positioning: Upright in bed Baseline Vocal Quality: Normal Volitional Cough: Strong Volitional Swallow: Able to elicit    Oral/Motor/Sensory Function Overall Oral Motor/Sensory Function: Appears within functional limits for tasks assessed   Ice Chips Ice chips: Not tested   Thin Liquid Thin Liquid: Within functional limits Presentation: Cup;Self Fed;Straw Other Comments:  (~5-6 ozs)    Nectar Thick Nectar Thick Liquid: Not tested   Honey Thick Honey Thick Liquid: Not tested   Puree Puree: Within functional limits Presentation: Self Fed;Spoon Other Comments:  (~4 ozs)   Solid   GO    Solid: Within functional limits Presentation: Self Fed Other Comments:  (4 trials)       Watson,Katherine 05/19/2014,4:02 PM

## 2014-05-19 NOTE — Evaluation (Signed)
Occupational Therapy Evaluation Patient Details Name: [name] Browning MRN: [id number] DOB: 1940/02/20 Today's Date: 05/19/2014    History of Present Illness Pt comes to hospital with complaints of confusion and slurred speech. Pt with history of HTN, CVA, and COPD   Clinical Impression   Pt arrived to hospital with signs and symptoms of TIA with confusion and slurred speech, but symptoms have resolved and he has returned to PLOF for ADLs.  He has pain 8/10 in left side of neck and into shoulder area, but is able to complete ADLs without assist.  He had minimal dizziness when leaning forward to remove sock but symptoms resolved with rest and sittng back up.  Rec pt complete dressing skills sitting up in chair vs EOB or standing and bring LEs up and cross to reach feet for socks and shoes.  No further OT needed or recommended.    Follow Up Recommendations  No OT follow up    Equipment Recommendations       Recommendations for Other Services       Precautions / Restrictions Precautions Precautions: None Restrictions Weight Bearing Restrictions: No      Mobility Bed Mobility Overal bed mobility: Independent                Transfers Overall transfer level: Independent Equipment used: None                  Balance Overall balance assessment: Independent                                          ADL Overall ADL's : At baseline                                       General ADL Comments: Pt had minimal dizziness when leaning forward to reach feet but no changes in ANS. Rec pt complete dressing skills seated to decrease dizziness episodes and increase safety.     Vision     Perception     Praxis      Pertinent Vitals/Pain Pain Assessment: 0-10 Pain Score: 6  Pain Location: R neck Pain Descriptors / Indicators: Aching;Constant Pain Intervention(s): Monitored during session     Hand Dominance Right   Extremity/Trunk  Assessment Upper Extremity Assessment Upper Extremity Assessment: Overall WFL for tasks assessed   Lower Extremity Assessment Lower Extremity Assessment: Defer to PT evaluation       Communication Communication Communication: No difficulties   Cognition Arousal/Alertness: Awake/alert Behavior During Therapy: WFL for tasks assessed/performed Overall Cognitive Status: Within Functional Limits for tasks assessed                     General Comments       Exercises       Shoulder Instructions      Home Living Family/patient expects to be discharged to:: Private residence Living Arrangements: Spouse/significant other   Type of Home: House Home Access: Stairs to enter [employer] of Steps: 2 Entrance Stairs-Rails: Can reach both Home Layout: One level     Bathroom Shower/Tub: Tub/shower unit Shower/tub characteristics: Curtain                  Prior Functioning/Environment Level of Independence: Independent  OT Diagnosis:     OT Problem List:     OT Treatment/Interventions:      OT Goals(Current goals can be found in the care plan section) Acute Rehab OT Goals Patient Stated Goal: to go home  OT Frequency:     Barriers to D/C:            Co-evaluation              End of Session Nurse Communication: Other (comment) (see above)  Activity Tolerance: Patient tolerated treatment well Patient left: in bed;with family/visitor present;Other (comment) (sitting EOB with grandson in room and nsg notified that alarm was off so he could eat sitting up)   Time: 1420-1455 OT Time Calculation (min): 35 min Charges:  OT General Charges $OT Visit: 1 Procedure OT Evaluation $Initial OT Evaluation Tier I: 1 Procedure OT Treatments $Self Care/Home Management : 23-37 mins G-Codes:    Jonathon Tan Jun 09, 2014, 5:09 PM    Chrys Racer, OTR/L

## 2014-05-19 NOTE — Progress Notes (Signed)
Physical Therapy Evaluation Patient Details Name: [name] Browning MRN: [id number] DOB: Jan 01, 1941 Today's Date: 05/19/2014   History of Present Illness  Pt comes to hospital with complaints of confusion and slurred speech. Pt with history of HTN, CVA, and COPD  Clinical Impression  Pt is a pleasant 74 year old male who was admitted for possible CVA. Pt performs bed mobility, transfers, and ambulation with independence with no AD. Pt with coordination intact along with facial symmetry. Pt does note some sensation deficits in R face as well as slightly slurred speech. Pt demonstrates all bed mobility/transfers/ambulation at baseline level. Pt does not require any further PT needs at this time. Pt will be dc in house and does not require follow up. RN aware. Will dc current orders.     Follow Up Recommendations No PT follow up    Equipment Recommendations  None recommended by PT    Recommendations for Other Services       Precautions / Restrictions Precautions Precautions: None Restrictions Weight Bearing Restrictions: No      Mobility  Bed Mobility Overal bed mobility: Independent                Transfers Overall transfer level: Independent Equipment used: None                Ambulation/Gait Ambulation/Gait assistance: Independent Ambulation Distance (Feet): 190 Feet Assistive device: None Gait Pattern/deviations: WFL(Within Functional Limits)     General Gait Details: reciprocal gait pattern noted. No fatigue or SOB symptoms noted. Safe technique  Stairs            Wheelchair Mobility    Modified Rankin (Stroke Patients Only)       Balance Overall balance assessment: Independent                                           Pertinent Vitals/Pain Pain Assessment: 0-10 Pain Score: 6  Pain Location: R neck, however does not change with position or movement Pain Descriptors / Indicators: Aching;Constant Pain Intervention(s):  Monitored during session    Home Living Family/patient expects to be discharged to:: Private residence Living Arrangements: Spouse/significant other   Type of Home: House Home Access: Stairs to enter Entrance Stairs-Rails: Can reach both Entrance Stairs-Number of Steps: 2 Home Layout: One level        Prior Function Level of Independence: Independent               Hand Dominance        Extremity/Trunk Assessment   Upper Extremity Assessment: Generalized weakness (R UE grossly 3/5; L UE grossly 5/5)           Lower Extremity Assessment: Overall WFL for tasks assessed         Communication   Communication: No difficulties  Cognition Arousal/Alertness: Awake/alert Behavior During Therapy: WFL for tasks assessed/performed Overall Cognitive Status: Within Functional Limits for tasks assessed                      General Comments General comments (skin integrity, edema, etc.): complains of L facial numbness and R LE numbness    Exercises        Assessment/Plan    PT Assessment Patent does not need any further PT services  PT Diagnosis     PT Problem List    PT Treatment Interventions  PT Goals (Current goals can be found in the Care Plan section) Acute Rehab PT Goals Patient Stated Goal: to go home PT Goal Formulation: With patient Time For Goal Achievement: 05/19/14 Potential to Achieve Goals: Good    Frequency     Barriers to discharge        Co-evaluation               End of Session Equipment Utilized During Treatment: Gait belt Activity Tolerance: Patient tolerated treatment well Patient left: in chair;with family/visitor present Nurse Communication: Mobility status         Time: 9147-8295 PT Time Calculation (min) (ACUTE ONLY): 16 min   Charges:   PT Evaluation $Initial PT Evaluation Tier I: 1 Procedure     PT G CodesGreggory Browning, PT, DPT [phone]   Nandita Mathenia 05/19/2014, 3:37 PM

## 2014-05-19 NOTE — Plan of Care (Signed)
Problem: SLP Dysphagia Goals Goal: Misc Dysphagia Goal Pt will safely tolerate po diet of least restrictive consistency w/ no overt s/s of aspiration noted by Staff/pt/family x1-2 sessions.

## 2014-05-19 NOTE — Consult Note (Signed)
Cardiology Consultation Note  Patient ID: [name] Browning, MRN: [id number], DOB/AGE: 08/01/1940 74 y.o. Admit date: 05/18/2014   Date of Consult: 05/19/2014 Primary Physician: Lorelee Market, MD Primary Cardiologist: New to [facility]  Chief Complaint: Right sided weakness and slurred speech Reason for Consult: Intermittent chest pain x 1 week  HPI: 74 y.o. male with h/o prior syncope in 2004 evaluated by cardiac by that showed normal coronary arteries complicated by right groin bleed requiring surgical intervention, COPD, tobacco abuse, ETOH abuse, HTN, and HLD who presented to [facility] with TIA and 1 week history of intermittent chest pain.   In 2004 patient suffered chest pain followed by syncopal episode that was evaluated with cardiac cath that showed normal coronary arteries, EF 80%, complicated by right groin bleed requiring surgical intervention. He had repeat chest pain in 2007 and underwent a functional study that was negative. He was admitted to [facility] in 2011 with repeat chest pain followed by syncope (troponins negative x 3) and underwent another nuclear stress test that showed no evidence of ischemia, EF 69%, normal wall motion. He underwent head CT that was negative for acute pathology. Carotid dopplers were negative for high grade stenosis. He was hypertensive throughout his admission. He was noted to have an LDL of 168. He was advised to take his home medications daily as he previously took them intermittently. He did not follow up with cardiology as an outpatient.   He presented to [facility] on 5/8 with a 1 week history of intermittent chest pain and a a one day history of slurred speech, right sided facial droop, and right sided weakness. Upon his arrival to the ED his slurred speech and facial droop were improved. Blood pressure was in the 165V-374M systolic upon arrival, improved to the 140s-130s currently. It was suspected the patient had suffered an acute stroke and the ED physician discussed tPA  with the patient and his family. However, they did not want to proceed with this and this was not administered. Head CT showed no acute abnormality. MRI brain showed no acute process, chronic, small-vessel ischemic changes affecting the pons in the cerebral hemispheric white matter. He also complained of intermittent chest pain for the past 1 week. He notes chest pain almost daily for the past one week that is not related to exertion. No associated nausea, vomiting, diaphoresis, palpitations, presyncope, or syncope. No edema. He states for the past several months he has been having brief seconds of chest pain that goes down his central chest that are not related to activity. Troponin was negative x 3. CXR not done. Carotid doppers were unchanged from 08/2013. He is currently without any chest pain. Echo is pending.     Past Medical History  Diagnosis Date  . COPD (chronic obstructive pulmonary disease)   . Hypertension   . Stroke   . HLD (hyperlipidemia)   . Normal coronary arteries     a. by cardiac cath in 2004; b. normal stress test in 2007 and 2011  . Syncope       Most Recent Cardiac Studies: As above.    Surgical History: History reviewed. No pertinent past surgical history.   Home Meds: Prior to Admission medications   Medication Sig Start Date End Date Taking? Authorizing Provider  albuterol (PROVENTIL HFA;VENTOLIN HFA) 108 (90 BASE) MCG/ACT inhaler Inhale 1 puff into the lungs every 6 (six) hours as needed for wheezing or shortness of breath.   Yes Historical Provider, MD  ALPRAZolam Duanne Moron) 0.5 MG tablet  Take 0.5 mg by mouth 2 (two) times daily as needed for anxiety.   Yes Historical Provider, MD  celecoxib (CELEBREX) 200 MG capsule Take 200 mg by mouth 2 (two) times daily.   Yes Historical Provider, MD  cyclobenzaprine (FLEXERIL) 10 MG tablet Take 10 mg by mouth 3 (three) times daily as needed for muscle spasms.   Yes Historical Provider, MD  simvastatin (ZOCOR) 20 MG tablet Take  20 mg by mouth daily.   Yes Historical Provider, MD    Inpatient Medications:  . aspirin  300 mg Rectal Daily   Or  . aspirin  325 mg Oral Daily  . heparin  5,000 Units Subcutaneous 3 times per day  . simvastatin  20 mg Oral Daily   . sodium chloride 75 mL/hr at 05/19/14 0300    Allergies: No Known Allergies  History   Social History  . Marital Status: Single    Spouse Name: N/A  . Number of Children: N/A  . Years of Education: N/A   Occupational History  . Not on file.   Social History Main Topics  . Smoking status: Former Smoker    Types: Cigarettes  . Smokeless tobacco: Not on file  . Alcohol Use: 0.6 oz/week    1 Cans of beer per week  . Drug Use: No  . Sexual Activity: Not on file   Other Topics Concern  . Not on file   Social History Narrative     Family History  Problem Relation Age of Onset  . Hypertension Mother   . CAD Father   . Stroke Father      Review of Systems: Review of Systems  Constitutional: Positive for malaise/fatigue. Negative for fever, chills, weight loss and diaphoresis.  Respiratory: Positive for cough. Negative for hemoptysis, sputum production, shortness of breath and wheezing.   Cardiovascular: Positive for chest pain. Negative for palpitations, orthopnea, claudication, leg swelling and PND.  Gastrointestinal: Negative for heartburn, nausea and vomiting.  Neurological: Positive for dizziness, tingling, sensory change, speech change and focal weakness. Negative for tremors, seizures, loss of consciousness and weakness.  Psychiatric/Behavioral: Negative for hallucinations and memory loss. The patient is not nervous/anxious.      Labs:  Recent Labs  05/18/14 1402 05/19/14 0421 05/19/14 1335  TROPONINI <0.03 <0.03 <0.03   Lab Results  Component Value Date   WBC 6.7 05/18/2014   HGB 13.2 05/18/2014   HCT 38.9* 05/18/2014   MCV 89.8 05/18/2014   PLT 183 05/18/2014     Recent Labs Lab 05/18/14 1402 05/18/14 1856    NA 143  --   K 3.6  --   CL 110  --   CO2 27  --   BUN 27*  --   CREATININE 1.02 0.93  CALCIUM 9.1  --   PROT 7.1  --   BILITOT 0.7  --   ALKPHOS 118  --   ALT 19  --   AST 27  --   GLUCOSE 91  --    Lab Results  Component Value Date   CHOL 198 05/19/2014   HDL 47 05/19/2014   LDLCALC 139* 05/19/2014   TRIG 59 05/19/2014   No results found for: DDIMER  Radiology/Studies:  Ct Head (brain) Wo Contrast  05/18/2014   CLINICAL DATA:  Code stroke.  Right facial droop and slurred speech.  EXAM: CT HEAD WITHOUT CONTRAST  TECHNIQUE: Contiguous axial images were obtained from the base of the skull through the vertex without intravenous contrast.  COMPARISON:  08/10/2013 and brain MR dated 08/12/2013.  FINDINGS: Mildly enlarged subarachnoid spaces. Normal size and position of the ventricles. Minimal patchy white matter low density in both cerebral hemispheres. No intracranial hemorrhage, mass lesion or CT evidence of acute infarction. Unremarkable bones and included paranasal sinuses.  IMPRESSION: 1. No acute abnormality. 2. Stable minimal diffuse cortical atrophy and minimal chronic small vessel white matter ischemic changes in both cerebral hemispheres. These results were called by telephone at the time of interpretation on 05/18/2014 at 2:25 pm to Dr. Nance Pear , who verbally acknowledged these results.   Electronically Signed   By: Claudie Revering M.D.   On: 05/18/2014 14:30   [name]  05/19/2014   CLINICAL DATA:  History of hypertension. Stroke. COPD. Confusion. Slurred speech. Right facial droop. Symptoms began 05/18/2014  EXAM: MRI HEAD WITHOUT CONTRAST  TECHNIQUE: Multiplanar, multiecho pulse sequences of the brain and surrounding structures were obtained without intravenous contrast.  COMPARISON:  Head CT 05/18/2014.  MRI 08/12/2013  FINDINGS: Diffusion imaging does not show any acute or subacute infarction. There are moderate chronic small-vessel ischemic changes affecting  the pons. No focal cerebellar insult. The cerebral hemispheres show mild chronic small-vessel ischemic changes within the white matter. No cortical or large vessel territory infarction. Dilated perivascular space at the base of the brain on the right. No mass lesion, hemorrhage, hydrocephalus or extra-axial collection. No pituitary mass. No inflammatory sinus disease. No skull or skullbase lesion. Major vessels at the base of the brain show flow.  IMPRESSION: No acute insult. Chronic small-vessel ischemic changes affecting the pons in the cerebral hemispheric white matter.   Electronically Signed   By: Nelson Chimes M.D.   On: 05/19/2014 10:28   US Carotid Bilateral  05/19/2014   CLINICAL DATA:  Stroke. History of hypertension, syncopal episode, visual disturbance, hyperlipidemia.  EXAM: BILATERAL CAROTID DUPLEX ULTRASOUND  TECHNIQUE: Pearline Cables scale imaging, color Doppler and duplex ultrasound were performed of bilateral carotid and vertebral arteries in the neck.  COMPARISON:  Brain MRI - earlier same day; carotid Doppler ultrasound - 08/10/2013  FINDINGS: Criteria: Quantification of carotid stenosis is based on velocity parameters that correlate the residual internal carotid diameter with NASCET-based stenosis levels, using the diameter of the distal internal carotid lumen as the denominator for stenosis measurement.  The following velocity measurements were obtained:  RIGHT  ICA:  120/31 cm/sec  CCA:  789/38 cm/sec  SYSTOLIC ICA/CCA RATIO:  1.0  DIASTOLIC ICA/CCA RATIO:  1.6  ECA:  107 cm/sec  LEFT  ICA:  95/27 cm/sec  CCA:  101/75 cm/sec  SYSTOLIC ICA/CCA RATIO:  1.0  DIASTOLIC ICA/CCA RATIO:  1.3  ECA:  116 cm/sec  RIGHT CAROTID ARTERY: There is a moderate amount of eccentric mixed echogenic plaque within the right carotid bulb (image 11), extending to involve the origin, proximal and mid aspects of the right internal carotid artery (images 17 and 20), morphologically similar to the 08/2013 examination and again  not resulting in elevated peak systolic velocities within the interrogated course of the right internal carotid artery to suggest a hemodynamically significant stenosis.  RIGHT VERTEBRAL ARTERY:  Antegrade flow  LEFT CAROTID ARTERY: There is eccentric intimal wall thickening within in the distal aspect of the left common carotid artery (image 39), grossly unchanged since the 08/2013 examination. There is a moderate amount of eccentric mixed echogenic plaque within the left carotid bulb (image 42), extending to involve the origin and proximal aspect of the left internal carotid  artery (image 48), morphologically similar to the 08/2013 examination and again not not resulting in elevated peak systolic velocities within the interrogated course of the left internal carotid artery to suggest a hemodynamically significant stenosis  LEFT VERTEBRAL ARTERY:  Antegrade flow  IMPRESSION: Moderate amount of bilateral intimal thickening and atherosclerotic plaque, morphologically similar to the 08/2013 examination and again not resulting in a hemodynamically significant stenosis.   Electronically Signed   By: Sandi Mariscal M.D.   On: 05/19/2014 13:09    EKG: NSR, 75 bpm, nonspecific anterior st/t changes  Weights: Filed Weights   05/18/14 1402 05/19/14 0424  Weight: 178 lb (80.74 kg) 173 lb 12.8 oz (78.835 kg)     Physical Exam: Blood pressure 156/74, pulse 59, temperature 97.9 F (36.6 C), temperature source Oral, resp. rate 17, height 6\' 1"  (1.854 m), weight 173 lb 12.8 oz (78.835 kg), SpO2 93 %. Body mass index is 22.94 kg/(m^2). General: Well developed, well nourished, in no acute distress. Head: Normocephalic, atraumatic, sclera non-icteric, no xanthomas, nares are without discharge.  Neck: Negative for carotid bruits. JVD not elevated. Lungs: Clear bilaterally to auscultation without wheezes, rales, or rhonchi. Breathing is unlabored. Heart: RRR with S1 S2. No murmurs, rubs, or gallops  appreciated. Abdomen: Soft, non-tender, non-distended with normoactive bowel sounds. No hepatomegaly. No rebound/guarding. No obvious abdominal masses. Msk:  Strength and tone appear normal for age. Extremities: No clubbing or cyanosis. No edema.  Distal pedal pulses are 2+ and equal bilaterally. Neuro: Alert and oriented X 3. No facial asymmetry. No focal deficit. Moves all extremities spontaneously. Psych:  Responds to questions appropriately with a normal affect.    Assessment and Plan:  1. Chest pain with moderate risk of cardiac etiology: -Last nuclear stress test 2007 showed no ischemia and no wall motion abnormalities (in the setting of chest pain and syncope) -Multiple CVD risk factors (HTN, HLD, prior tobacco abuse) -Echo is pending, plan to let echo dictate ischemic evaluation (if normal EF without wall motion abnormalities would plan for nuclear stress test 05/20/2014 in the morning. If low EF or wall motion abnormalities are present would move forward with cardiac cath - given his history of complication post cardiac cath in 2004 and TIA this admission, may be beneficial to further evaluate via nuclear study prior to cath if needed) -Given negative troponin x 3, hold heparin gtt at this time -Change simvastatin to Lipitor 80 mg daily  -Continue aspirin   2. Hypertensive urgency: -Presented with blood pressures in the 509T-267T systolic, currently improved to the 245Y-099I systolic -SCr normal -Start lisinopril 5 mg daily   3. TIA: -MRI without acute insult, chronic small-vessel changes -CT head without acute process -Carotid doppler without any change compared to 08/2013, no hemodynamically significant stenosis  -Continue aspirin and Lipitor as above  4. ETOH abuse: -Cessation is advised  5. COPD: -Stable  6. HLD: -Lipitor as above    Signed, Jiovanny Burdell PA-C 05/19/2014, 4:10 PM

## 2014-05-20 ENCOUNTER — Inpatient Hospital Stay: Payer: Medicare Other

## 2014-05-20 ENCOUNTER — Ambulatory Visit: Payer: Medicare Other

## 2014-05-20 DIAGNOSIS — R079 Chest pain, unspecified: Secondary | ICD-10-CM

## 2014-05-20 LAB — NM MYOCAR MULTI W/SPECT W/WALL MOTION / EF
Estimated workload: 1 METS
LV dias vol: 77 mL
LV sys vol: 22 mL
Peak HR: 71 {beats}/min
Percent of predicted max HR: 47 %
Rest HR: 59 {beats}/min
SDS: 0
SRS: 0
SSS: 0
Stage 1 HR: 59 {beats}/min
Stage 2 Grade: 0 %
Stage 2 HR: 59 {beats}/min
Stage 2 Speed: 0 mph
Stage 3 Grade: 0 %
Stage 3 HR: 59 {beats}/min
Stage 3 Speed: 0 mph
Stage 4 Grade: 0 %
Stage 4 HR: 71 {beats}/min
Stage 4 Speed: 0 mph
Stage 5 Grade: 0 %
Stage 5 HR: 82 {beats}/min
Stage 5 Speed: 0 mph
Stage 6 DBP: 66 mmHg
Stage 6 Grade: 0 %
Stage 6 HR: 76 {beats}/min
Stage 6 SBP: 130 mmHg
Stage 6 Speed: 0 mph
TID: 1.24

## 2014-05-20 LAB — BASIC METABOLIC PANEL
Anion gap: 6 (ref 5–15)
BUN: 18 mg/dL (ref 6–20)
CHLORIDE: 109 mmol/L (ref 101–111)
CO2: 27 mmol/L (ref 22–32)
Calcium: 8.8 mg/dL — ABNORMAL LOW (ref 8.9–10.3)
Creatinine, Ser: 0.9 mg/dL (ref 0.61–1.24)
Glucose, Bld: 100 mg/dL — ABNORMAL HIGH (ref 65–99)
Potassium: 3.8 mmol/L (ref 3.5–5.1)
SODIUM: 142 mmol/L (ref 135–145)

## 2014-05-20 MED ORDER — TECHNETIUM TC 99M SESTAMIBI - CARDIOLITE
30.0000 | Freq: Once | INTRAVENOUS | Status: AC | PRN
  Administered 2014-05-20: 10:00:00 29.69 via INTRAVENOUS

## 2014-05-20 MED ORDER — ATORVASTATIN CALCIUM 80 MG PO TABS: 80.0000 mg | ORAL_TABLET | Freq: Every day | ORAL | Status: DC

## 2014-05-20 MED ORDER — ASPIRIN 300 MG RE SUPP: 300.0000 mg | Freq: Every day | RECTAL | Status: DC

## 2014-05-20 MED ORDER — TECHNETIUM TC 99M SESTAMIBI - CARDIOLITE
13.9500 | Freq: Once | INTRAVENOUS | Status: AC | PRN
  Administered 2014-05-20: 09:00:00 13.95 via INTRAVENOUS

## 2014-05-20 MED ORDER — LISINOPRIL 5 MG PO TABS: 5.0000 mg | ORAL_TABLET | Freq: Every day | ORAL | Status: DC

## 2014-05-20 NOTE — Discharge Summary (Signed)
[facility] NAME: [name] Browning    MR#:  [id number]  DATE OF BIRTH:  [birth year]  DATE OF ADMISSION:  05/18/2014 ADMITTING PHYSICIAN: Demetrios Loll, MD  DATE OF DISCHARGE: No discharge date for patient encounter.  PRIMARY CARE PHYSICIAN: Lorelee Market, MD    ADMISSION DIAGNOSIS:  Slurred speech [R47.81] Stroke [I63.9]  DISCHARGE DIAGNOSIS:  Principal Problem:   Speech problem- TIA    Acute CVA ruled out by negative MRI brain. Active Problems:   HTN (hypertension)   Hyperlipidemia   Dehydration   SECONDARY DIAGNOSIS:   Past Medical History  Diagnosis Date  . COPD (chronic obstructive pulmonary disease)   . Hypertension   . Stroke   . HLD (hyperlipidemia)   . Normal coronary arteries     a. by cardiac cath in 2004; b. normal stress test in 2007 and 2011  . Syncope     HOSPITAL COURSE:    * Acute CVA. Monitor on telemetry floor. I will continue aspirin and Zocor. get carotid duplex, echocardiogram, negative brain MRI.  Will get PT eval.  * C/o anginal type chest pain  monitpor on tele, serial troponin, Echo.  Appreciated cardio , Stress test done- negative. * Hypertension.continue patient patient hypertension medication. * COPD, stable. * Possible carotid artery stenosis, ( as pt said- he was diagnosed with that) No hemodynamic abnormalities due to that per doppler. Follow with PMD.   DISCHARGE CONDITIONS:   Stable.  CONSULTS OBTAINED:  Treatment Team:  Minna Merritts, MD  DRUG ALLERGIES:  No Known Allergies  DISCHARGE MEDICATIONS:   Current Discharge Medication List    START taking these medications   Details  aspirin 300 MG suppository Place 1 suppository (300 mg total) rectally daily. Qty: 12 suppository, Refills: 0    atorvastatin (LIPITOR) 80 MG tablet Take 1 tablet (80 mg total) by mouth daily at 6 PM. Qty: 30 tablet, Refills: 0    lisinopril (PRINIVIL,ZESTRIL) 5 MG tablet  Take 1 tablet (5 mg total) by mouth daily. Qty: 30 tablet, Refills: 0      CONTINUE these medications which have NOT CHANGED   Details  albuterol (PROVENTIL HFA;VENTOLIN HFA) 108 (90 BASE) MCG/ACT inhaler Inhale 1 puff into the lungs every 6 (six) hours as needed for wheezing or shortness of breath.    ALPRAZolam (XANAX) 0.5 MG tablet Take 0.5 mg by mouth 2 (two) times daily as needed for anxiety.    celecoxib (CELEBREX) 200 MG capsule Take 200 mg by mouth 2 (two) times daily.    cyclobenzaprine (FLEXERIL) 10 MG tablet Take 10 mg by mouth 3 (three) times daily as needed for muscle spasms.      STOP taking these medications     simvastatin (ZOCOR) 20 MG tablet          DISCHARGE INSTRUCTIONS:    Diet- low sodium, low cholesterol. Activities- as tolerated.  If you experience worsening of your admission symptoms, develop shortness of breath, life threatening emergency, suicidal or homicidal thoughts you must seek medical attention immediately by calling 911 or calling your MD immediately  if symptoms less severe.  You Must read complete instructions/literature along with all the possible adverse reactions/side effects for all the Medicines you take and that have been prescribed to you. Take any new Medicines after you have completely understood and accept all the possible adverse reactions/side effects.   Please note  You were cared for by a hospitalist during your hospital  stay. If you have any questions about your discharge medications or the care you received while you were in the hospital after you are discharged, you can call the unit and asked to speak with the hospitalist on call if the hospitalist that took care of you is not available. Once you are discharged, your primary care physician will handle any further medical issues. Please note that NO REFILLS for any discharge medications will be authorized once you are discharged, as it is imperative that you return to your  primary care physician (or establish a relationship with a primary care physician if you do not have one) for your aftercare needs so that they can reassess your need for medications and monitor your lab values.    Today   CHIEF COMPLAINT:   Chief Complaint  Patient presents with  . Cerebrovascular Accident  . Chest Pain    HISTORY OF PRESENT ILLNESS:  [name] Browning  is a 73 y.o. male with a known history of hypertension, stroke and COPD. The patient was noticed to be confused, slurred speech and the right facial droop this morning. Since the patient was sent to the ED for further evaluation 45 minutes after the episode. Patient's slurred speech and facial droop were better the ED, Dr. Archie Balboa suspect the patient has CVA and discussed with the patient's wife and daughter about TPA treatment. They don't want TPA treatment. The patient was treated with aspirin 324 mg one dosing ED. Patient also complains of chest pain on and off for a few days. He denies any shortness of breath, cough, orthopnea, nocturnal dyspnea, or leg edema. Patient's daughter mentioned that patient has carotid artery stenosis more than 75% recently, but she doesn't know which side.   VITAL SIGNS:  Blood pressure 150/77, pulse 62, temperature 97.6 F (36.4 C), temperature source Oral, resp. rate 18, height 6\' 1"  (1.854 m), weight 80.015 kg (176 lb 6.4 oz), SpO2 97 %.  I/O:   Intake/Output Summary (Last 24 hours) at 05/20/14 1458 Last data filed at 05/20/14 1421  Gross per 24 hour  Intake    480 ml  Output    950 ml  Net   -470 ml    PHYSICAL EXAMINATION:  GENERAL: 74 y.o.-year-old patient lying in the bed with no acute distress.  EYES: Pupils equal, round, reactive to light and accommodation. No scleral icterus. Extraocular muscles intact.  HEENT: Head atraumatic, normocephalic. Oropharynx and nasopharynx clear.  NECK: Supple, no jugular venous distention. No thyroid enlargement, no tenderness.  LUNGS:  Normal breath sounds bilaterally, no wheezing, rales,rhonchi or crepitation. No use of accessory muscles of respiration.  CARDIOVASCULAR: S1, S2 normal. No murmurs, rubs, or gallops.  ABDOMEN: Soft, nontender, nondistended. Bowel sounds present. No organomegaly or mass.  EXTREMITIES: No pedal edema, cyanosis, or clubbing.  NEUROLOGIC: Cranial nerves II through XII are intact. Muscle strength 5/5 in all extremities. Sensation intact. Gait not checked.  PSYCHIATRIC: The patient is alert and oriented x 3.  SKIN: No obvious rash, lesion, or ulcer.   DATA REVIEW:   CBC  Recent Labs Lab 05/18/14 1856  WBC 6.7  HGB 13.2  HCT 38.9*  PLT 183    Chemistries   Recent Labs Lab 05/18/14 1402  05/20/14 1249  NA 143  --  142  K 3.6  --  3.8  CL 110  --  109  CO2 27  --  27  GLUCOSE 91  --  100*  BUN 27*  --  18  CREATININE  1.02  < > 0.90  CALCIUM 9.1  --  8.8*  AST 27  --   --   ALT 19  --   --   ALKPHOS 118  --   --   BILITOT 0.7  --   --   < > = values in this interval not displayed.  Cardiac Enzymes  Recent Labs Lab 05/19/14 Jud <0.03    Microbiology Results  No results found for this or any previous visit.  RADIOLOGY:  Mr Herby Abraham Contrast  05/19/2014   CLINICAL DATA:  History of hypertension. Stroke. COPD. Confusion. Slurred speech. Right facial droop. Symptoms began 05/18/2014  EXAM: MRI HEAD WITHOUT CONTRAST  TECHNIQUE: Multiplanar, multiecho pulse sequences of the brain and surrounding structures were obtained without intravenous contrast.  COMPARISON:  Head CT 05/18/2014.  MRI 08/12/2013  FINDINGS: Diffusion imaging does not show any acute or subacute infarction. There are moderate chronic small-vessel ischemic changes affecting the pons. No focal cerebellar insult. The cerebral hemispheres show mild chronic small-vessel ischemic changes within the white matter. No cortical or large vessel territory infarction. Dilated perivascular space at the base of  the brain on the right. No mass lesion, hemorrhage, hydrocephalus or extra-axial collection. No pituitary mass. No inflammatory sinus disease. No skull or skullbase lesion. Major vessels at the base of the brain show flow.  IMPRESSION: No acute insult. Chronic small-vessel ischemic changes affecting the pons in the cerebral hemispheric white matter.   Electronically Signed   By: Nelson Chimes M.D.   On: 05/19/2014 10:28   US Carotid Bilateral  05/19/2014   CLINICAL DATA:  Stroke. History of hypertension, syncopal episode, visual disturbance, hyperlipidemia.  EXAM: BILATERAL CAROTID DUPLEX ULTRASOUND  TECHNIQUE: Pearline Cables scale imaging, color Doppler and duplex ultrasound were performed of bilateral carotid and vertebral arteries in the neck.  COMPARISON:  Brain MRI - earlier same day; carotid Doppler ultrasound - 08/10/2013  FINDINGS: Criteria: Quantification of carotid stenosis is based on velocity parameters that correlate the residual internal carotid diameter with NASCET-based stenosis levels, using the diameter of the distal internal carotid lumen as the denominator for stenosis measurement.  The following velocity measurements were obtained:  RIGHT  ICA:  120/31 cm/sec  CCA:  350/09 cm/sec  SYSTOLIC ICA/CCA RATIO:  1.0  DIASTOLIC ICA/CCA RATIO:  1.6  ECA:  107 cm/sec  LEFT  ICA:  95/27 cm/sec  CCA:  381/82 cm/sec  SYSTOLIC ICA/CCA RATIO:  1.0  DIASTOLIC ICA/CCA RATIO:  1.3  ECA:  116 cm/sec  RIGHT CAROTID ARTERY: There is a moderate amount of eccentric mixed echogenic plaque within the right carotid bulb (image 11), extending to involve the origin, proximal and mid aspects of the right internal carotid artery (images 17 and 20), morphologically similar to the 08/2013 examination and again not resulting in elevated peak systolic velocities within the interrogated course of the right internal carotid artery to suggest a hemodynamically significant stenosis.  RIGHT VERTEBRAL ARTERY:  Antegrade flow  LEFT CAROTID  ARTERY: There is eccentric intimal wall thickening within in the distal aspect of the left common carotid artery (image 39), grossly unchanged since the 08/2013 examination. There is a moderate amount of eccentric mixed echogenic plaque within the left carotid bulb (image 42), extending to involve the origin and proximal aspect of the left internal carotid artery (image 48), morphologically similar to the 08/2013 examination and again not not resulting in elevated peak systolic velocities within the interrogated course of the left internal carotid artery  to suggest a hemodynamically significant stenosis  LEFT VERTEBRAL ARTERY:  Antegrade flow  IMPRESSION: Moderate amount of bilateral intimal thickening and atherosclerotic plaque, morphologically similar to the 08/2013 examination and again not resulting in a hemodynamically significant stenosis.   Electronically Signed   By: Sandi Mariscal M.D.   On: 05/19/2014 13:09    EKG:   Orders placed or performed during the hospital encounter of 05/18/14  . EKG  . EKG  . EKG 12-Lead  . EKG 12-Lead      Management plans discussed with the patient, family and they are in agreement.  CODE STATUS:  Full code  TOTAL TIME TAKING CARE OF THIS PATIENT: 40 minutes.    Vaughan Basta M.D on 05/20/2014 at 2:58 PM  Between 7am to 6pm - Pager - [phone]  After 6pm go to [email or web address] - password [email or web address] [facility]  Office  [phone]  CC: Primary care physician; Lorelee Market, MD

## 2014-05-20 NOTE — Discharge Instructions (Addendum)
Diet - low sodium, Low fat. Activities - as tolerated. Follow with PMD in 1 week.

## 2014-05-20 NOTE — Progress Notes (Signed)
Patient: [name] Browning / Admit Date: 05/18/2014 / Date of Encounter: 05/20/2014, 11:14 AM   Subjective: He is for [employer] today. No further chest pain.   Review of Systems: Review of Systems  Constitutional: Positive for malaise/fatigue. Negative for fever, chills, weight loss and diaphoresis.  Respiratory: Negative for cough, shortness of breath and wheezing.   Cardiovascular: Negative for chest pain, palpitations, orthopnea, claudication, leg swelling and PND.  Neurological: Positive for tingling, focal weakness and weakness. Negative for sensory change and speech change.    Objective: Telemetry: NSR, 80s-90s Physical Exam: Blood pressure 150/77, pulse 62, temperature 97.9 F (36.6 C), temperature source Oral, resp. rate 18, height 6\' 1"  (1.854 m), weight 176 lb 6.4 oz (80.015 kg), SpO2 97 %. Body mass index is 23.28 kg/(m^2). General: Well developed, well nourished, in no acute distress. Head: Normocephalic, atraumatic, sclera non-icteric, no xanthomas, nares are without discharge. Neck: Negative for carotid bruits. JVP not elevated. Lungs: Clear bilaterally to auscultation without wheezes, rales, or rhonchi. Breathing is unlabored. Heart: RRR S1 S2 without murmurs, rubs, or gallops.  Abdomen: Soft, non-tender, non-distended with normoactive bowel sounds. No rebound/guarding. Extremities: No clubbing or cyanosis. No edema. Distal pedal pulses are 2+ and equal bilaterally. Neuro: Alert and oriented X 3. Moves all extremities spontaneously. Psych:  Responds to questions appropriately with a normal affect.   Intake/Output Summary (Last 24 hours) at 05/20/14 1114 Last data filed at 05/20/14 0852  Gross per 24 hour  Intake    240 ml  Output    650 ml  Net   -410 ml    Inpatient Medications:  . aspirin  300 mg Rectal Daily   Or  . aspirin  325 mg Oral Daily  . atorvastatin  80 mg Oral q1800  . heparin  5,000 Units Subcutaneous 3 times per day  . lisinopril  5 mg  Oral Daily   Infusions:  . sodium chloride 75 mL/hr at 05/19/14 2220    Labs:  Recent Labs  05/18/14 1402 05/18/14 1856  NA 143  --   K 3.6  --   CL 110  --   CO2 27  --   GLUCOSE 91  --   BUN 27*  --   CREATININE 1.02 0.93  CALCIUM 9.1  --     Recent Labs  05/18/14 1402  AST 27  ALT 19  ALKPHOS 118  BILITOT 0.7  PROT 7.1  ALBUMIN 3.8    Recent Labs  05/18/14 1402 05/18/14 1856  WBC 6.4 6.7  NEUTROABS 4.2  --   HGB 13.3 13.2  HCT 40.1 38.9*  MCV 90.2 89.8  PLT 181 183    Recent Labs  05/18/14 1402 05/19/14 0421 05/19/14 1335 05/19/14 1744  TROPONINI <0.03 <0.03 <0.03 <0.03   Invalid input(s): POCBNP  Recent Labs  05/19/14 0421  HGBA1C 5.4     Weights: Filed Weights   05/18/14 1402 05/19/14 0424 05/20/14 0614  Weight: 178 lb (80.74 kg) 173 lb 12.8 oz (78.835 kg) 176 lb 6.4 oz (80.015 kg)     Radiology/Studies:  Ct Head (brain) Wo Contrast  05/18/2014   CLINICAL DATA:  Code stroke.  Right facial droop and slurred speech.  EXAM: CT HEAD WITHOUT CONTRAST  TECHNIQUE: Contiguous axial images were obtained from the base of the skull through the vertex without intravenous contrast.  COMPARISON:  08/10/2013 and brain MR dated 08/12/2013.  FINDINGS: Mildly enlarged subarachnoid spaces. Normal size and position of the ventricles. Minimal  patchy white matter low density in both cerebral hemispheres. No intracranial hemorrhage, mass lesion or CT evidence of acute infarction. Unremarkable bones and included paranasal sinuses.  IMPRESSION: 1. No acute abnormality. 2. Stable minimal diffuse cortical atrophy and minimal chronic small vessel white matter ischemic changes in both cerebral hemispheres. These results were called by telephone at the time of interpretation on 05/18/2014 at 2:25 pm to Dr. Nance Pear , who verbally acknowledged these results.   Electronically Signed   By: Claudie Revering M.D.   On: 05/18/2014 14:30   [name]  05/19/2014    CLINICAL DATA:  History of hypertension. Stroke. COPD. Confusion. Slurred speech. Right facial droop. Symptoms began 05/18/2014  EXAM: MRI HEAD WITHOUT CONTRAST  TECHNIQUE: Multiplanar, multiecho pulse sequences of the brain and surrounding structures were obtained without intravenous contrast.  COMPARISON:  Head CT 05/18/2014.  MRI 08/12/2013  FINDINGS: Diffusion imaging does not show any acute or subacute infarction. There are moderate chronic small-vessel ischemic changes affecting the pons. No focal cerebellar insult. The cerebral hemispheres show mild chronic small-vessel ischemic changes within the white matter. No cortical or large vessel territory infarction. Dilated perivascular space at the base of the brain on the right. No mass lesion, hemorrhage, hydrocephalus or extra-axial collection. No pituitary mass. No inflammatory sinus disease. No skull or skullbase lesion. Major vessels at the base of the brain show flow.  IMPRESSION: No acute insult. Chronic small-vessel ischemic changes affecting the pons in the cerebral hemispheric white matter.   Electronically Signed   By: Nelson Chimes M.D.   On: 05/19/2014 10:28   US Carotid Bilateral  05/19/2014   CLINICAL DATA:  Stroke. History of hypertension, syncopal episode, visual disturbance, hyperlipidemia.  EXAM: BILATERAL CAROTID DUPLEX ULTRASOUND  TECHNIQUE: Pearline Cables scale imaging, color Doppler and duplex ultrasound were performed of bilateral carotid and vertebral arteries in the neck.  COMPARISON:  Brain MRI - earlier same day; carotid Doppler ultrasound - 08/10/2013  FINDINGS: Criteria: Quantification of carotid stenosis is based on velocity parameters that correlate the residual internal carotid diameter with NASCET-based stenosis levels, using the diameter of the distal internal carotid lumen as the denominator for stenosis measurement.  The following velocity measurements were obtained:  RIGHT  ICA:  120/31 cm/sec  CCA:  073/71 cm/sec  SYSTOLIC ICA/CCA  RATIO:  1.0  DIASTOLIC ICA/CCA RATIO:  1.6  ECA:  107 cm/sec  LEFT  ICA:  95/27 cm/sec  CCA:  062/69 cm/sec  SYSTOLIC ICA/CCA RATIO:  1.0  DIASTOLIC ICA/CCA RATIO:  1.3  ECA:  116 cm/sec  RIGHT CAROTID ARTERY: There is a moderate amount of eccentric mixed echogenic plaque within the right carotid bulb (image 11), extending to involve the origin, proximal and mid aspects of the right internal carotid artery (images 17 and 20), morphologically similar to the 08/2013 examination and again not resulting in elevated peak systolic velocities within the interrogated course of the right internal carotid artery to suggest a hemodynamically significant stenosis.  RIGHT VERTEBRAL ARTERY:  Antegrade flow  LEFT CAROTID ARTERY: There is eccentric intimal wall thickening within in the distal aspect of the left common carotid artery (image 39), grossly unchanged since the 08/2013 examination. There is a moderate amount of eccentric mixed echogenic plaque within the left carotid bulb (image 42), extending to involve the origin and proximal aspect of the left internal carotid artery (image 48), morphologically similar to the 08/2013 examination and again not not resulting in elevated peak systolic velocities within the interrogated  course of the left internal carotid artery to suggest a hemodynamically significant stenosis  LEFT VERTEBRAL ARTERY:  Antegrade flow  IMPRESSION: Moderate amount of bilateral intimal thickening and atherosclerotic plaque, morphologically similar to the 08/2013 examination and again not resulting in a hemodynamically significant stenosis.   Electronically Signed   By: Sandi Mariscal M.D.   On: 05/19/2014 13:09     Assessment and Plan  1. Chest pain with moderate risk of cardiac etiology: -Last nuclear stress test 2007 showed no ischemia and no wall motion abnormalities (in the setting of chest pain and syncope) -Multiple CVD risk factors (HTN, HLD, prior tobacco abuse) -He is for [employer]  today -Echo showed normal LV systolic function and wall motion. There was an incidental finding of possible small mass on the anterior mitral valve leaflet. Given that he has no clinical signs of endocarditis and no evidence of embolization will hold off on TEE at this time. Repeat echo in 6 months  -Given negative troponin x 3, hold heparin gtt at this time -Change simvastatin to Lipitor 80 mg daily  -Continue aspirin   2. Hypertensive urgency: -Presented with blood pressures in the 981X-914N systolic, currently improved to the 829F-621H systolic -SCr normal -Continue lisinopril 5 mg daily  -Check BMET  3. TIA: -MRI without acute insult, chronic small-vessel changes -CT head without acute process -Carotid doppler without any change compared to 08/2013, no hemodynamically significant stenosis  -Continue aspirin and Lipitor as above  4. ETOH abuse: -Cessation is advised  5. COPD: -Stable  6. HLD: -Lipitor as above   Signed, Christell Faith, PA-C 05/20/2014 11:17 AM

## 2014-05-20 NOTE — Progress Notes (Signed)
ST Visit Note: Met w/ pt who denied any trouble swallowing w/ meals/meds. Briefly reviewed chart notes; no reports of dysphagia indicated. Pt feels he is at his baseline w/ regard to swallowing and speech(missing dentition). ST services will sign off at this time; NSG to reconsult if nec. Pt agreed.

## 2014-05-21 ENCOUNTER — Encounter: Payer: Self-pay | Admitting: Physician Assistant

## 2014-06-05 DIAGNOSIS — E784 Other hyperlipidemia: Secondary | ICD-10-CM | POA: Diagnosis not present

## 2014-07-17 DIAGNOSIS — E559 Vitamin D deficiency, unspecified: Secondary | ICD-10-CM | POA: Diagnosis not present

## 2014-07-17 DIAGNOSIS — E780 Pure hypercholesterolemia: Secondary | ICD-10-CM | POA: Diagnosis not present

## 2014-07-17 DIAGNOSIS — R5383 Other fatigue: Secondary | ICD-10-CM | POA: Diagnosis not present

## 2014-07-17 DIAGNOSIS — R7989 Other specified abnormal findings of blood chemistry: Secondary | ICD-10-CM | POA: Diagnosis not present

## 2014-08-28 DIAGNOSIS — Z79899 Other long term (current) drug therapy: Secondary | ICD-10-CM | POA: Diagnosis not present

## 2014-10-09 DIAGNOSIS — Z23 Encounter for immunization: Secondary | ICD-10-CM | POA: Diagnosis not present

## 2014-12-01 DIAGNOSIS — Z79899 Other long term (current) drug therapy: Secondary | ICD-10-CM | POA: Diagnosis not present

## 2014-12-01 DIAGNOSIS — G5602 Carpal tunnel syndrome, left upper limb: Secondary | ICD-10-CM | POA: Diagnosis not present

## 2014-12-01 DIAGNOSIS — I10 Essential (primary) hypertension: Secondary | ICD-10-CM | POA: Diagnosis not present

## 2014-12-01 DIAGNOSIS — F419 Anxiety disorder, unspecified: Secondary | ICD-10-CM | POA: Diagnosis not present

## 2014-12-01 DIAGNOSIS — E784 Other hyperlipidemia: Secondary | ICD-10-CM | POA: Diagnosis not present

## 2014-12-02 DIAGNOSIS — Z79891 Long term (current) use of opiate analgesic: Secondary | ICD-10-CM | POA: Diagnosis not present

## 2015-01-28 ENCOUNTER — Ambulatory Visit: Payer: Self-pay | Admitting: Family Medicine

## 2015-02-16 ENCOUNTER — Ambulatory Visit (INDEPENDENT_AMBULATORY_CARE_PROVIDER_SITE_OTHER): Payer: Medicare Other | Admitting: Family Medicine

## 2015-02-16 ENCOUNTER — Encounter: Payer: Self-pay | Admitting: Family Medicine

## 2015-02-16 VITALS — BP 156/84 | HR 94 | Temp 97.8°F | Resp 16 | Ht 73.0 in | Wt 176.0 lb

## 2015-02-16 DIAGNOSIS — F419 Anxiety disorder, unspecified: Secondary | ICD-10-CM

## 2015-02-16 DIAGNOSIS — I10 Essential (primary) hypertension: Secondary | ICD-10-CM

## 2015-02-16 DIAGNOSIS — E785 Hyperlipidemia, unspecified: Secondary | ICD-10-CM

## 2015-02-16 DIAGNOSIS — R079 Chest pain, unspecified: Secondary | ICD-10-CM | POA: Insufficient documentation

## 2015-02-16 MED ORDER — ALPRAZOLAM 0.5 MG PO TABS: 0.5000 mg | ORAL_TABLET | Freq: Every evening | ORAL | Status: DC | PRN

## 2015-02-16 MED ORDER — LISINOPRIL 20 MG PO TABS: 20.0000 mg | ORAL_TABLET | Freq: Every day | ORAL | Status: DC

## 2015-02-16 MED ORDER — ATORVASTATIN CALCIUM 40 MG PO TABS: 40.0000 mg | ORAL_TABLET | Freq: Every day | ORAL | Status: DC

## 2015-02-16 NOTE — Progress Notes (Signed)
Name: [name] Browning   MRN: [id number]    DOB: 20-Apr-1940   Date:02/16/2015       Progress Note  Subjective  Chief Complaint  Chief Complaint  Patient presents with  . Establish Care  . Medication Refill  . Hypertension  . Hyperlipidemia  . Anxiety  . Neck Pain    when he turns his head to the left  . Wrist Pain    due to carpal tunnel worsening    Hypertension [name]. The problem is uncontrolled. Associated symptoms include anxiety and chest pain (occasional left sided chest pain, sometimes with raising of the arm. ). Pertinent negatives include no blurred vision (has cataracts in both eyes, makes signs blurry), headaches, malaise/fatigue, palpitations or shortness of breath. Past treatments include ACE inhibitors. Hypertensive end-organ damage includes CVA (TIA in 2016). There is no history of kidney disease or CAD/MI.  Hyperlipidemia [name]. Associated symptoms include chest pain (occasional left sided chest pain, sometimes with raising of the arm. ) and leg pain (unsure if its from statin but sometimes has leg cramps when he stoops down). Pertinent negatives include no myalgias or shortness of breath. Current antihyperlipidemic treatment includes statins.  Anxiety Presents for initial visit. The problem has been unchanged. Symptoms include chest pain (occasional left sided chest pain, sometimes with raising of the arm. ), depressed mood (occasional depressed mood), insomnia, irritability and nervous/anxious behavior. Patient reports no palpitations, shortness of breath or suicidal ideas. The symptoms are aggravated by family issues (11 year old grand kid 'gets on my nerves').   His past medical history is significant for anxiety/panic attacks and depression (was apparently on anti-depressants at one time). Past treatments include benzodiazephines. Compliance with prior treatments has been good.    Past Medical History  Diagnosis Date  . COPD  (chronic obstructive pulmonary disease) ([place])   . Hypertension   . HLD (hyperlipidemia)   . Normal coronary arteries     a. by cardiac cath in 2004; b. normal stress test in 2007 and 2011; c. Lexiscan 05/20/2014: no significant ischemia, EF 55-65%, no EKG changes, low risk study   . Syncope   . Stroke [facility])     TIA in 2016  . Carpal tunnel syndrome     Both wrists.    Past Surgical History  Procedure Laterality Date  . Carpal tunnel release    . Thyroidectomy  1950    Not sure if total or partial thyroidectomy.    Family History  Problem Relation Age of Onset  . Hypertension Mother   . Heart disease Mother   . CAD Father   . Heart attack Father     Social History   Social History  . Marital Status: Single    Spouse Name: N/A  . Number of Children: N/A  . Years of Education: N/A   Occupational History  . Not on file.   Social History Main Topics  . Smoking status: Former Smoker    Types: Cigarettes    Quit date: 02/11/2003  . Smokeless tobacco: Not on file  . Alcohol Use: 4.2 oz/week    7 Cans of beer per week  . Drug Use: No  . Sexual Activity: No   Other Topics Concern  . Not on file   Social History Narrative     Current outpatient prescriptions:  .  albuterol (PROVENTIL HFA;VENTOLIN HFA) 108 (90 BASE) MCG/ACT inhaler, Inhale 1 puff into the lungs every 6 (six) hours  as needed for wheezing or shortness of breath., Disp: , Rfl:  .  ALPRAZolam (XANAX) 0.5 MG tablet, Take 0.5 mg by mouth 2 (two) times daily as needed for anxiety. Reported on 02/16/2015, Disp: , Rfl:  .  atorvastatin (LIPITOR) 80 MG tablet, Take 1 tablet (80 mg total) by mouth daily at 6 PM., Disp: 30 tablet, Rfl: 0 .  lisinopril (PRINIVIL,ZESTRIL) 5 MG tablet, Take 1 tablet (5 mg total) by mouth daily., Disp: 30 tablet, Rfl: 0  No Known Allergies   Review of Systems  Constitutional: Positive for irritability. Negative for fever, chills, weight loss and malaise/fatigue.  Eyes: Negative for  blurred vision (has cataracts in both eyes, makes signs blurry) and double vision.  Respiratory: Negative for shortness of breath.   Cardiovascular: Positive for chest pain (occasional left sided chest pain, sometimes with raising of the arm. ). Negative for palpitations.  Gastrointestinal: Negative for abdominal pain.  Musculoskeletal: Negative for myalgias, back pain and joint pain.  Neurological: Negative for headaches.  Psychiatric/Behavioral: Positive for depression. Negative for suicidal ideas. The patient is nervous/anxious and has insomnia.     Objective  Filed Vitals:   02/16/15 1053  BP: 152/74  Pulse: 94  Temp: 97.8 F (36.6 C)  TempSrc: Oral  Resp: 16  Height: 6\' 1"  (1.854 m)  Weight: 176 lb (79.833 kg)  SpO2: 95%    Physical Exam  Constitutional: He is oriented to person, place, and time and well-developed, well-nourished, and in no distress.  HENT:  Head: Normocephalic and atraumatic.  Cardiovascular: Normal rate, regular rhythm and normal heart sounds.   No murmur heard. Pulmonary/Chest: Effort normal and breath sounds normal. He has no wheezes.  Abdominal: Soft. Bowel sounds are normal.  Musculoskeletal: He exhibits no edema.  Neurological: He is alert and oriented to person, place, and time.  Psychiatric: Mood, memory, affect and judgment normal.  Nursing note and vitals reviewed.     Assessment & Plan  1. Essential hypertension SBP is elevated, will increase lisinopril to 20 mg daily. Recheck BP in one month. - lisinopril (PRINIVIL,ZESTRIL) 20 MG tablet; Take 1 tablet (20 mg total) by mouth daily.  Dispense: 30 tablet; Refill: 2  2. Hyperlipidemia Patient on Lipitor 80 mg at bedtime by previous PCP, no records of FLP available. At this point no clinical indication to be on high-dose statin, consequently will decrease to 40 mg and obtain lipid panel. - Lipid Profile - Comprehensive Metabolic Panel (CMET) - atorvastatin (LIPITOR) 40 MG tablet; Take 1  tablet (40 mg total) by mouth daily at 6 PM.  Dispense: 90 tablet; Refill: 0  3. Anxiety We will change alprazolam from 0.5 mg twice a day to 0.5 mg daily when necessary. Refill provided. Follow-up in one month. - ALPRAZolam (XANAX) 0.5 MG tablet; Take 1 tablet (0.5 mg total) by mouth at bedtime as needed for anxiety. Reported on 02/16/2015  Dispense: 30 tablet; Refill: 0  4. Intermittent chest pain EKG reassuring, however because of persistent chest pain, will refer to cardiology for further evaluation. - EKG 12-Lead - Ambulatory referral to Cardiology  [facility] 02/16/2015 11:16 AM

## 2015-02-18 DIAGNOSIS — E785 Hyperlipidemia, unspecified: Secondary | ICD-10-CM | POA: Diagnosis not present

## 2015-02-19 LAB — COMPREHENSIVE METABOLIC PANEL
ALT: 12 IU/L (ref 0–44)
AST: 18 IU/L (ref 0–40)
Albumin/Globulin Ratio: 1.5 (ref 1.1–2.5)
Albumin: 4 g/dL (ref 3.5–4.8)
Alkaline Phosphatase: 124 IU/L — ABNORMAL HIGH (ref 39–117)
BUN/Creatinine Ratio: 22 (ref 10–22)
BUN: 24 mg/dL (ref 8–27)
Bilirubin Total: 0.4 mg/dL (ref 0.0–1.2)
CALCIUM: 9.4 mg/dL (ref 8.6–10.2)
CO2: 21 mmol/L (ref 18–29)
CREATININE: 1.08 mg/dL (ref 0.76–1.27)
Chloride: 103 mmol/L (ref 96–106)
GFR, EST AFRICAN AMERICAN: 78 mL/min/{1.73_m2} (ref >59)
GFR, EST NON AFRICAN AMERICAN: 68 mL/min/{1.73_m2} (ref >59)
Globulin, Total: 2.7 g/dL (ref 1.5–4.5)
Glucose: 97 mg/dL (ref 65–99)
POTASSIUM: 4.5 mmol/L (ref 3.5–5.2)
Sodium: 142 mmol/L (ref 134–144)
Total Protein: 6.7 g/dL (ref 6.0–8.5)

## 2015-02-19 LAB — LIPID PANEL
CHOL/HDL RATIO: 5 ratio (ref 0.0–5.0)
Cholesterol, Total: 287 mg/dL — ABNORMAL HIGH (ref 100–199)
HDL: 57 mg/dL (ref >39)
LDL CALC: 218 mg/dL — AB (ref 0–99)
TRIGLYCERIDES: 60 mg/dL (ref 0–149)
VLDL Cholesterol Cal: 12 mg/dL (ref 5–40)

## 2015-03-02 ENCOUNTER — Ambulatory Visit (INDEPENDENT_AMBULATORY_CARE_PROVIDER_SITE_OTHER): Payer: Medicare Other | Admitting: Family Medicine

## 2015-03-02 ENCOUNTER — Encounter: Payer: Self-pay | Admitting: Family Medicine

## 2015-03-02 VITALS — BP 138/80 | HR 79 | Temp 97.8°F | Resp 18 | Ht 73.0 in | Wt 179.2 lb

## 2015-03-02 DIAGNOSIS — E785 Hyperlipidemia, unspecified: Secondary | ICD-10-CM

## 2015-03-02 DIAGNOSIS — Z8673 Personal history of transient ischemic attack (TIA), and cerebral infarction without residual deficits: Secondary | ICD-10-CM | POA: Diagnosis not present

## 2015-03-02 DIAGNOSIS — R748 Abnormal levels of other serum enzymes: Secondary | ICD-10-CM | POA: Diagnosis not present

## 2015-03-02 DIAGNOSIS — I10 Essential (primary) hypertension: Secondary | ICD-10-CM

## 2015-03-02 MED ORDER — ROSUVASTATIN CALCIUM 40 MG PO TABS: 40.0000 mg | ORAL_TABLET | Freq: Every day | ORAL | Status: DC

## 2015-03-02 MED ORDER — ROSUVASTATIN CALCIUM 20 MG PO TABS: 20.0000 mg | ORAL_TABLET | Freq: Every day | ORAL | Status: DC

## 2015-03-02 NOTE — Progress Notes (Signed)
Name: [name] Browning   MRN: [id number]    DOB: 1940-01-26   Date:03/02/2015       Progress Note  Subjective  Chief Complaint  Chief Complaint  Patient presents with  . Follow-up    2 wk  . Hyperlipidemia  . Anxiety    Hyperlipidemia [name]. The problem is uncontrolled. Recent lipid tests were reviewed and are high. Associated symptoms include chest pain (intermittent CP, referred to Cardiology, appt. on 2/23). Pertinent negatives include no leg pain, myalgias or shortness of breath. Current antihyperlipidemic treatment includes statins.  Hypertension [name]. The problem is uncontrolled. Associated symptoms include chest pain (intermittent CP, referred to Cardiology, appt. on 2/23). Pertinent negatives include no blurred vision (sometimes sees 'blue light' on road signs at night.), headaches, palpitations or shortness of breath. Past treatments include ACE inhibitors. There are no compliance problems.  Hypertensive end-organ damage includes CVA (reportedly had a 'mini stroke' about 10 years ago). There is no history of kidney disease.    Past Medical History  Diagnosis Date  . COPD (chronic obstructive pulmonary disease) ([place])   . Hypertension   . HLD (hyperlipidemia)   . Normal coronary arteries     a. by cardiac cath in 2004; b. normal stress test in 2007 and 2011; c. Lexiscan 05/20/2014: no significant ischemia, EF 55-65%, no EKG changes, low risk study   . Syncope   . Stroke [facility])     TIA in 2016  . Carpal tunnel syndrome     Both wrists.    Past Surgical History  Procedure Laterality Date  . Carpal tunnel release    . Thyroidectomy  1950    Not sure if total or partial thyroidectomy.    Family History  Problem Relation Age of Onset  . Hypertension Mother   . Heart disease Mother   . CAD Father   . Heart attack Father     Social History   Social History  . Marital Status: Single    Spouse Name: N/A  . Number of Children: N/A   . Years of Education: N/A   Occupational History  . Not on file.   Social History Main Topics  . Smoking status: Former Smoker    Types: Cigarettes    Quit date: 02/11/2003  . Smokeless tobacco: Not on file  . Alcohol Use: 4.2 oz/week    7 Cans of beer per week  . Drug Use: No  . Sexual Activity: No   Other Topics Concern  . Not on file   Social History Narrative     Current outpatient prescriptions:  .  albuterol (PROVENTIL HFA;VENTOLIN HFA) 108 (90 BASE) MCG/ACT inhaler, Inhale 1 puff into the lungs every 6 (six) hours as needed for wheezing or shortness of breath., Disp: , Rfl:  .  ALPRAZolam (XANAX) 0.5 MG tablet, Take 1 tablet (0.5 mg total) by mouth at bedtime as needed for anxiety. Reported on 02/16/2015, Disp: 30 tablet, Rfl: 0 .  lisinopril (PRINIVIL,ZESTRIL) 20 MG tablet, Take 1 tablet (20 mg total) by mouth daily., Disp: 30 tablet, Rfl: 2  No Known Allergies   Review of Systems  Eyes: Negative for blurred vision (sometimes sees 'blue light' on road signs at night.).  Respiratory: Negative for shortness of breath.   Cardiovascular: Positive for chest pain (intermittent CP, referred to Cardiology, appt. on 2/23). Negative for palpitations and leg swelling.  Musculoskeletal: Negative for myalgias.  Neurological: Negative for headaches.  Objective  Filed Vitals:   03/02/15 1403 03/02/15 1434  BP: 144/71 138/80  Pulse: 79   Temp: 97.8 F (36.6 C)   TempSrc: Oral   Resp: 18   Height: 6\' 1"  (1.854 m)   Weight: 179 lb 3.2 oz (81.285 kg)   SpO2: 95%     Physical Exam  Constitutional: He is oriented to person, place, and time and well-developed, well-nourished, and in no distress.  HENT:  Head: Normocephalic and atraumatic.  Cardiovascular: Normal rate and regular rhythm.   Pulmonary/Chest: Effort normal and breath sounds normal.  Abdominal: Soft. Bowel sounds are normal.  Musculoskeletal: He exhibits no edema.  Neurological: He is alert and  oriented to person, place, and time.  Psychiatric: Mood, memory, affect and judgment normal.  Nursing note and vitals reviewed.       Assessment & Plan  1. History of TIA (transient ischemic attack) Recommended patient to start taking aspirin 81 mg daily  2. Essential hypertension Repeat BP at goal. Continue on present dose of lisinopril. Recheck in 6 weeks  3. Hyperlipidemia Fasting lipid panel not at goal on max dose of atorvastatin. We'll start on Crestor 20 mg for first 2 weeks, then increasing to 40 mg. Evaluate for secondary causes of hyperlipidemia with TSH. Lipid panel does not improve on max dose Crestor, consider workup for FH - TSH - rosuvastatin (CRESTOR) 20 MG tablet; Take 1 tablet (20 mg total) by mouth daily.  Dispense: 15 tablet; Refill: 0 - rosuvastatin (CRESTOR) 40 MG tablet; Take 1 tablet (40 mg total) by mouth daily.  Dispense: 90 tablet; Refill: 0  4. Elevated alkaline phosphatase level  - Hepatic function panel   [name] Browning [facility] 03/02/2015 2:35 PM

## 2015-03-03 ENCOUNTER — Telehealth: Payer: Self-pay | Admitting: Family Medicine

## 2015-03-03 LAB — HEPATIC FUNCTION PANEL
ALBUMIN: 4.2 g/dL (ref 3.5–4.8)
ALK PHOS: 130 IU/L — AB (ref 39–117)
ALT: 14 IU/L (ref 0–44)
AST: 20 IU/L (ref 0–40)
BILIRUBIN, DIRECT: 0.11 mg/dL (ref 0.00–0.40)
Bilirubin Total: 0.4 mg/dL (ref 0.0–1.2)
TOTAL PROTEIN: 7.2 g/dL (ref 6.0–8.5)

## 2015-03-03 LAB — TSH: TSH: 1.2 u[IU]/mL (ref 0.450–4.500)

## 2015-03-03 NOTE — Telephone Encounter (Signed)
Please return patient call to discuss the medication that was prescribed on yesterday. Insurance will not cover one of the prescriptions.

## 2015-03-03 NOTE — Telephone Encounter (Signed)
Routed to Dr. Manuella Ghazi for medication advice

## 2015-03-03 NOTE — Telephone Encounter (Signed)
Returned call and no response from the number listed in his chart. Please have patient obtain a list of covered alternative medications

## 2015-03-05 ENCOUNTER — Telehealth: Payer: Self-pay | Admitting: Family Medicine

## 2015-03-05 NOTE — Telephone Encounter (Signed)
Spoke with patient and he is going to call his insurance to find out coverage guidelines

## 2015-03-05 NOTE — Telephone Encounter (Signed)
Patient came by this morning and stated that [employer] will not cover any of his medications and is asking if we can call [phone]. Patient is not certain as to why the company will not cover the meds.

## 2015-03-16 ENCOUNTER — Ambulatory Visit: Payer: Medicare Other | Admitting: Family Medicine

## 2015-03-18 ENCOUNTER — Encounter: Payer: Self-pay | Admitting: Family Medicine

## 2015-03-18 ENCOUNTER — Ambulatory Visit
Admission: RE | Admit: 2015-03-18 | Discharge: 2015-03-18 | Disposition: A | Discharge status: Home / Self Care | Payer: Medicare Other | Source: Ambulatory Visit | Attending: Family Medicine | Admitting: Family Medicine

## 2015-03-18 ENCOUNTER — Ambulatory Visit (INDEPENDENT_AMBULATORY_CARE_PROVIDER_SITE_OTHER): Payer: Medicare Other | Admitting: Family Medicine

## 2015-03-18 VITALS — BP 148/71 | HR 81 | Temp 97.7°F | Resp 17 | Ht 73.0 in | Wt 178.0 lb

## 2015-03-18 DIAGNOSIS — M19042 Primary osteoarthritis, left hand: Secondary | ICD-10-CM | POA: Diagnosis not present

## 2015-03-18 DIAGNOSIS — M19031 Primary osteoarthritis, right wrist: Secondary | ICD-10-CM | POA: Diagnosis not present

## 2015-03-18 DIAGNOSIS — M79641 Pain in right hand: Secondary | ICD-10-CM

## 2015-03-18 DIAGNOSIS — M79642 Pain in left hand: Secondary | ICD-10-CM | POA: Insufficient documentation

## 2015-03-18 DIAGNOSIS — M19041 Primary osteoarthritis, right hand: Secondary | ICD-10-CM | POA: Diagnosis not present

## 2015-03-18 DIAGNOSIS — M19032 Primary osteoarthritis, left wrist: Secondary | ICD-10-CM | POA: Diagnosis not present

## 2015-03-18 NOTE — Progress Notes (Signed)
Name: [name] Browning   MRN: [id number]    DOB: [birth year]   Date:03/18/2015       Progress Note  Subjective  Chief Complaint  Chief Complaint  Patient presents with  . Follow-up    check hands/ pt states he had carpal tunnel surgery   . Hypertension  . Hyperlipidemia    HPI  Hand Pain Pt. Presents for evaluation of bilateral hand pain and numbness. Started 8-10 years ago, he was found to have Carpal Tunnel Syndrome, and had surgery on the right hand. This did not result in any symptom improvement. Pt. Reports numbness is present all day long, pain is worse at night. He takes BC powder, which helps with pain but tingling is still there.  Past Medical History  Diagnosis Date  . COPD (chronic obstructive pulmonary disease) ([place])   . Hypertension   . HLD (hyperlipidemia)   . Normal coronary arteries     a. by cardiac cath in 2004; b. normal stress test in 2007 and 2011; c. Lexiscan 05/20/2014: no significant ischemia, EF 55-65%, no EKG changes, low risk study   . Syncope   . Stroke [facility])     TIA in 2016  . Carpal tunnel syndrome     Both wrists.    Past Surgical History  Procedure Laterality Date  . Carpal tunnel release    . Thyroidectomy  1950    Not sure if total or partial thyroidectomy.    Family History  Problem Relation Age of Onset  . Hypertension Mother   . Heart disease Mother   . CAD Father   . Heart attack Father     Social History   Social History  . Marital Status: Single    Spouse Name: N/A  . Number of Children: N/A  . Years of Education: N/A   Occupational History  . Not on file.   Social History Main Topics  . Smoking status: Former Smoker    Types: Cigarettes    Quit date: 02/11/2003  . Smokeless tobacco: Not on file  . Alcohol Use: 4.2 oz/week    7 Cans of beer per week  . Drug Use: No  . Sexual Activity: No   Other Topics Concern  . Not on file   Social History Narrative     Current outpatient prescriptions:  .  albuterol  (PROVENTIL HFA;VENTOLIN HFA) 108 (90 BASE) MCG/ACT inhaler, Inhale 1 puff into the lungs every 6 (six) hours as needed for wheezing or shortness of breath., Disp: , Rfl:  .  ALPRAZolam (XANAX) 0.5 MG tablet, Take 1 tablet (0.5 mg total) by mouth at bedtime as needed for anxiety. Reported on 02/16/2015, Disp: 30 tablet, Rfl: 0 .  lisinopril (PRINIVIL,ZESTRIL) 20 MG tablet, Take 1 tablet (20 mg total) by mouth daily., Disp: 30 tablet, Rfl: 2 .  rosuvastatin (CRESTOR) 40 MG tablet, Take 1 tablet (40 mg total) by mouth daily., Disp: 90 tablet, Rfl: 0  No Known Allergies   Review of Systems  Constitutional: Negative for fever and chills.  Musculoskeletal: Positive for joint pain.  Neurological: Positive for tingling.    Objective  Filed Vitals:   03/18/15 1113  BP: 148/71  Pulse: 81  Temp: 97.7 F (36.5 C)  TempSrc: Oral  Resp: 17  Height: 6\' 1"  (1.854 m)  Weight: 178 lb (80.74 kg)  SpO2: 96%    Physical Exam  Constitutional: He is oriented to person, place, and time and well-developed, well-nourished, and in no distress.  Neurological: He is alert and oriented to person, place, and time.  Decrease in sensation to monofilament over the 1st, 2nd, and 3rd digits on both hands. Normal ROM, cap refill, and strength.  Nursing note and vitals reviewed.   Assessment & Plan  1. Bilateral hand pain Obtain x-rays to start evaluation of pain and paresthesias in both hands. May need to be referred to hand specialist for further assessment. - DG Hand Complete Left; Future - DG Hand Complete Right; Future - DG Wrist Complete Left; Future - DG Wrist Complete Right; Future   Jonavan Vanhorn [facility] 03/18/2015 11:30 AM

## 2015-04-01 ENCOUNTER — Encounter: Payer: Self-pay | Admitting: Family Medicine

## 2015-04-01 ENCOUNTER — Ambulatory Visit (INDEPENDENT_AMBULATORY_CARE_PROVIDER_SITE_OTHER): Payer: Medicare Other | Admitting: Family Medicine

## 2015-04-01 VITALS — BP 122/68 | HR 89 | Temp 97.6°F | Resp 18 | Ht 73.0 in | Wt 178.2 lb

## 2015-04-01 DIAGNOSIS — F419 Anxiety disorder, unspecified: Secondary | ICD-10-CM | POA: Diagnosis not present

## 2015-04-01 DIAGNOSIS — M19042 Primary osteoarthritis, left hand: Secondary | ICD-10-CM

## 2015-04-01 DIAGNOSIS — R768 Other specified abnormal immunological findings in serum: Secondary | ICD-10-CM

## 2015-04-01 DIAGNOSIS — M19041 Primary osteoarthritis, right hand: Secondary | ICD-10-CM | POA: Insufficient documentation

## 2015-04-01 DIAGNOSIS — M1990 Unspecified osteoarthritis, unspecified site: Secondary | ICD-10-CM | POA: Diagnosis not present

## 2015-04-01 MED ORDER — DICLOFENAC SODIUM 1 % TD GEL: 2.0000 g | Freq: Three times a day (TID) | TRANSDERMAL | Status: DC

## 2015-04-01 MED ORDER — ALPRAZOLAM 0.5 MG PO TABS: 0.5000 mg | ORAL_TABLET | Freq: Every evening | ORAL | Status: DC | PRN

## 2015-04-01 NOTE — Progress Notes (Signed)
Name: [name] Browning   MRN: [id number]    DOB: 1940/10/19   Date:04/01/2015       Progress Note  Subjective  Chief Complaint  Chief Complaint  Patient presents with  . Pain    bilateral hand pain 2 week follow up    HPI  Pt. Returns for follow up of bilateral hand and wrist pain, worse in Right hand. X rays obtained at that time show diffuse arthritis in left hand, mild arthritis in the right hand. He has been using BC powder for pain relief. Pt. Has history of Carpal tunnel repair of right hand.  Anxiety: Pt. Returns for refills of Alprazolam. Takes 0.5 mg 1 tablet at bedtime as needed for anxiety. Working well, relieves his acute symptoms. No side effects reported.   Past Medical History  Diagnosis Date  . COPD (chronic obstructive pulmonary disease) ([place])   . Hypertension   . HLD (hyperlipidemia)   . Normal coronary arteries     a. by cardiac cath in 2004; b. normal stress test in 2007 and 2011; c. Lexiscan 05/20/2014: no significant ischemia, EF 55-65%, no EKG changes, low risk study   . Syncope   . Stroke [facility])     TIA in 2016  . Carpal tunnel syndrome     Both wrists.    Past Surgical History  Procedure Laterality Date  . Carpal tunnel release    . Thyroidectomy  1950    Not sure if total or partial thyroidectomy.    Family History  Problem Relation Age of Onset  . Hypertension Mother   . Heart disease Mother   . CAD Father   . Heart attack Father     Social History   Social History  . Marital Status: Single    Spouse Name: N/A  . Number of Children: N/A  . Years of Education: N/A   Occupational History  . Not on file.   Social History Main Topics  . Smoking status: Former Smoker    Types: Cigarettes    Quit date: 02/11/2003  . Smokeless tobacco: Not on file  . Alcohol Use: 4.2 oz/week    7 Cans of beer per week  . Drug Use: No  . Sexual Activity: No   Other Topics Concern  . Not on file   Social History Narrative     Current outpatient  prescriptions:  .  albuterol (PROVENTIL HFA;VENTOLIN HFA) 108 (90 BASE) MCG/ACT inhaler, Inhale 1 puff into the lungs every 6 (six) hours as needed for wheezing or shortness of breath., Disp: , Rfl:  .  ALPRAZolam (XANAX) 0.5 MG tablet, Take 1 tablet (0.5 mg total) by mouth at bedtime as needed for anxiety. Reported on 02/16/2015, Disp: 30 tablet, Rfl: 0 .  lisinopril (PRINIVIL,ZESTRIL) 20 MG tablet, Take 1 tablet (20 mg total) by mouth daily., Disp: 30 tablet, Rfl: 2 .  rosuvastatin (CRESTOR) 40 MG tablet, Take 1 tablet (40 mg total) by mouth daily., Disp: 90 tablet, Rfl: 0  No Known Allergies   Review of Systems  Constitutional: Negative for fever and chills.  Respiratory: Positive for shortness of breath (occasional shortness of breath).   Cardiovascular: Negative for chest pain.  Gastrointestinal: Negative for nausea, vomiting, abdominal pain and blood in stool.  Genitourinary: Negative for dysuria and hematuria.  Musculoskeletal: Positive for myalgias and joint pain.  Neurological: Positive for dizziness.     Objective  Filed Vitals:   04/01/15 0956  BP: 122/68  Pulse: 89  Temp:  97.6 F (36.4 C)  Resp: 18  Height: 6\' 1"  (1.854 m)  Weight: 178 lb 3 oz (80.825 kg)  SpO2: 93%    Physical Exam  Constitutional: He is oriented to person, place, and time and well-developed, well-nourished, and in no distress.  Cardiovascular: Normal rate and regular rhythm.   Pulmonary/Chest: Effort normal and breath sounds normal.  Abdominal: Soft. Bowel sounds are normal.  Musculoskeletal:       Right hand: He exhibits decreased range of motion and tenderness. He exhibits no deformity.       Left hand: He exhibits tenderness and bony tenderness. He exhibits no deformity.  Mild tenderness to palpation over the dorsal and palmar surfaces of left and right hands, no swelling, limited flexion of digits on right hand.  Neurological: He is alert and oriented to person, place, and time.  Skin: Skin  is warm and dry.  Nursing note and vitals reviewed.   Assessment & Plan  1. Anxiety Stable on senna S taken daily as needed. - ALPRAZolam (XANAX) 0.5 MG tablet; Take 1 tablet (0.5 mg total) by mouth at bedtime as needed for anxiety. Reported on 02/16/2015  Dispense: 30 tablet; Refill: 0  2. Arthritis of both hands X-rays of the wrists and hands reviewed. We'll start on Voltaren gel to be applied 3 times daily for osteoarthritis. Referral to orthopedics for further assessment. Obtain ANA and rheumatoid factor to rule out rheumatoid arthritis - ANA - Rheumatoid factor - Ambulatory referral to Orthopedic Surgery - diclofenac sodium (VOLTAREN) 1 % GEL; Apply 2 g topically 3 (three) times daily. Apply to both hands and wrists three times daily.  Dispense: 100 g; Refill: 0   Tanishia Lemaster [facility] 04/01/2015 10:06 AM

## 2015-04-02 ENCOUNTER — Encounter: Payer: Self-pay | Admitting: Cardiovascular Disease

## 2015-04-02 ENCOUNTER — Ambulatory Visit (INDEPENDENT_AMBULATORY_CARE_PROVIDER_SITE_OTHER): Payer: Medicare Other | Admitting: Cardiovascular Disease

## 2015-04-02 VITALS — BP 138/78 | HR 76 | Ht 73.0 in | Wt 177.5 lb

## 2015-04-02 DIAGNOSIS — I059 Rheumatic mitral valve disease, unspecified: Secondary | ICD-10-CM

## 2015-04-02 DIAGNOSIS — M1990 Unspecified osteoarthritis, unspecified site: Secondary | ICD-10-CM | POA: Diagnosis not present

## 2015-04-02 DIAGNOSIS — I10 Essential (primary) hypertension: Secondary | ICD-10-CM

## 2015-04-02 DIAGNOSIS — R079 Chest pain, unspecified: Secondary | ICD-10-CM

## 2015-04-02 NOTE — Progress Notes (Signed)
Cardiology Office Note   Date:  04/02/2015   ID:  [name], DOB [birth year], MRN [id number]  PCP:  Keith Rake, MD  Cardiologist:   Kathlyn Sacramento, MD   Chief Complaint  Patient presents with  . other    C/o sharp Chest pain. Meds reviewed verbally with pt.      History of Present Illness: [name] Browning is a 75 y.o. male who was referred by Dr. Manuella Ghazi for evaluation of chest pain. The patient has no previous cardiac history and had normal cardiac catheterization in 2004. I actually saw the patient at [facility] when he was hospitalized last year in May for neurologic symptoms and atypical chest pain. His brain MRI was normal. He underwent a pharmacologic nuclear stress test which showed no evidence of ischemia with normal ejection fraction. He had an echocardiogram done which showed a possible small mass on the mitral valve. Given that he had no symptoms of endocarditis and no convincing evidence of stroke, I elected not to proceed with TEE. The patient complains of sharp chest pain described as needles lasting for a few seconds. This has been a prolonged complaint for him. No associated shortness of breath, palpitations or syncope.    Past Medical History  Diagnosis Date  . COPD (chronic obstructive pulmonary disease) ([place])   . Hypertension   . HLD (hyperlipidemia)   . Normal coronary arteries     a. by cardiac cath in 2004; b. normal stress test in 2007 and 2011; c. Lexiscan 05/20/2014: no significant ischemia, EF 55-65%, no EKG changes, low risk study   . Syncope   . Stroke [name] Browning)     TIA in 2016  . Carpal tunnel syndrome     Both wrists.  Delmar Landau heart valve   . Kidney stones     Past Surgical History  Procedure Laterality Date  . Carpal tunnel release    . Thyroidectomy  1950    Not sure if total or partial thyroidectomy.  . Cardiac catheterization       Current Outpatient Prescriptions  Medication Sig Dispense Refill  . albuterol (PROVENTIL HFA;VENTOLIN HFA) 108 (90  BASE) MCG/ACT inhaler Inhale 1 puff into the lungs every 6 (six) hours as needed for wheezing or shortness of breath.    . ALPRAZolam (XANAX) 0.5 MG tablet Take 1 tablet (0.5 mg total) by mouth at bedtime as needed for anxiety. Reported on 02/16/2015 30 tablet 0  . diclofenac sodium (VOLTAREN) 1 % GEL Apply 2 g topically 3 (three) times daily. Apply to both hands and wrists three times daily. 100 g 0  . lisinopril (PRINIVIL,ZESTRIL) 20 MG tablet Take 1 tablet (20 mg total) by mouth daily. 30 tablet 2  . rosuvastatin (CRESTOR) 40 MG tablet Take 1 tablet (40 mg total) by mouth daily. 90 tablet 0   No current facility-administered medications for this visit.    Allergies:   Review of patient's allergies indicates no known allergies.    Social History:  The patient  reports that he quit smoking about 12 years ago. His smoking use included Cigarettes. He does not have any smokeless tobacco history on file. He reports that he drinks about 4.2 oz of alcohol per week. He reports that he does not use illicit drugs.   Family History:  The patient's family history includes CAD in his father; Heart attack in his father; Heart disease in his mother; Hypertension in his mother.    ROS:  Please see the  history of present illness.   Otherwise, review of systems are positive for none.   All other systems are reviewed and negative.    PHYSICAL EXAM: VS:  BP 138/78 mmHg  Pulse 76  Ht 6\' 1"  (1.854 m)  Wt 177 lb 8 oz (80.513 kg)  BMI 23.42 kg/m2 , BMI Body mass index is 23.42 kg/(m^2). GEN: Well nourished, well developed, in no acute distress HEENT: normal Neck: no JVD, carotid bruits, or masses Cardiac: RRR; no murmurs, rubs, or gallops,no edema  Respiratory:  clear to auscultation bilaterally, normal work of breathing GI: soft, nontender, nondistended, + BS MS: no deformity or atrophy Skin: warm and dry, no rash Neuro:  Strength and sensation are intact Psych: euthymic mood, full affect   EKG:  EKG  is ordered today. The ekg ordered today demonstrates normal sinus rhythm with no significant ST or T wave changes.   Recent Labs: 05/18/2014: Hemoglobin 13.2; Platelets 183 02/18/2015: BUN 24; Creatinine, Ser 1.08; Potassium 4.5; Sodium 142 03/02/2015: ALT 14; TSH 1.200    Lipid Panel    Component Value Date/Time   CHOL 287* 02/18/2015 1153   CHOL 198 05/19/2014 0421   TRIG 60 02/18/2015 1153   HDL 57 02/18/2015 1153   HDL 47 05/19/2014 0421   CHOLHDL 5.0 02/18/2015 1153   CHOLHDL 4.2 05/19/2014 0421   VLDL 12 05/19/2014 0421   LDLCALC 218* 02/18/2015 1153   LDLCALC 139* 05/19/2014 0421      Wt Readings from Last 3 Encounters:  04/02/15 177 lb 8 oz (80.513 kg)  04/01/15 178 lb 3 oz (80.825 kg)  03/18/15 178 lb (80.74 kg)        ASSESSMENT AND PLAN:  1.  Atypical chest pain: The patient's symptoms are very atypical and suggestive of musculoskeletal etiology with possible muscle spasm. Nuclear stress test last year was normal. Thus, I do not recommend further cardiac evaluation.  2. Mitral valve disease: Echocardiogram last year showed an incidental finding of possible small mass on the anterior mitral valve leaflet. The patient has no history of endocarditis and no prior stroke. I order an echocardiogram to ensure stability.     Disposition:   FU with me as needed.   Signed,  Kathlyn Sacramento, MD  04/02/2015 12:22 PM    [facility]

## 2015-04-02 NOTE — Patient Instructions (Signed)
Medication Instructions:  Your physician recommends that you continue on your current medications as directed. Please refer to the Current Medication list given to you today.   Labwork: none  Testing/Procedures: Your physician has requested that you have an echocardiogram. Echocardiography is a painless test that uses sound waves to create images of your heart. It provides your doctor with information about the size and shape of your heart and how well your heart's chambers and valves are working. This procedure takes approximately one hour. There are no restrictions for this procedure.    Follow-Up: Your physician recommends that you schedule a follow-up appointment in: as needed   Any Other Special Instructions Will Be Listed Below (If Applicable).     If you need a refill on your cardiac medications before your next appointment, please call your pharmacy.  Echocardiogram An echocardiogram, or echocardiography, uses sound waves (ultrasound) to produce an image of your heart. The echocardiogram is simple, painless, obtained within a short period of time, and offers valuable information to your health care provider. The images from an echocardiogram can provide information such as:  Evidence of coronary artery disease (CAD).  Heart size.  Heart muscle function.  Heart valve function.  Aneurysm detection.  Evidence of a past heart attack.  Fluid buildup around the heart.  Heart muscle thickening.  Assess heart valve function. LET [facility] CARE PROVIDER KNOW ABOUT:  Any allergies you have.  All medicines you are taking, including vitamins, herbs, eye drops, creams, and over-the-counter medicines.  Previous problems you or members of your family have had with the use of anesthetics.  Any blood disorders you have.  Previous surgeries you have had.  Medical conditions you have.  Possibility of pregnancy, if this applies. BEFORE THE PROCEDURE  No special  preparation is needed. Eat and drink normally.  PROCEDURE   In order to produce an image of your heart, gel will be applied to your chest and a wand-like tool (transducer) will be moved over your chest. The gel will help transmit the sound waves from the transducer. The sound waves will harmlessly bounce off your heart to allow the heart images to be captured in real-time motion. These images will then be recorded.  You may need an IV to receive a medicine that improves the quality of the pictures. AFTER THE PROCEDURE You may return to your normal schedule including diet, activities, and medicines, unless your health care provider tells you otherwise.   This information is not intended to replace advice given to you by your health care provider. Make sure you discuss any questions you have with your health care provider.   Document Released: 12/25/1999 Document Revised: 01/17/2014 Document Reviewed: 09/03/2012 Elsevier Interactive Patient Education [employer].

## 2015-04-03 LAB — RHEUMATOID FACTOR: RHEUMATOID FACTOR: 22.7 [IU]/mL — AB (ref 0.0–13.9)

## 2015-04-03 LAB — ANA: ANA TITER 1: NEGATIVE

## 2015-04-08 DIAGNOSIS — R768 Other specified abnormal immunological findings in serum: Secondary | ICD-10-CM | POA: Insufficient documentation

## 2015-04-08 NOTE — Addendum Note (Signed)
Addended byManuella Ghazi, Aliece Honold A A on: 04/08/2015 05:52 PM   Modules accepted: Orders

## 2015-04-15 DIAGNOSIS — G5603 Carpal tunnel syndrome, bilateral upper limbs: Secondary | ICD-10-CM | POA: Diagnosis not present

## 2015-04-16 DIAGNOSIS — M79642 Pain in left hand: Secondary | ICD-10-CM | POA: Diagnosis not present

## 2015-04-16 DIAGNOSIS — M79641 Pain in right hand: Secondary | ICD-10-CM | POA: Diagnosis not present

## 2015-04-16 DIAGNOSIS — Z9889 Other specified postprocedural states: Secondary | ICD-10-CM | POA: Diagnosis not present

## 2015-04-16 DIAGNOSIS — R768 Other specified abnormal immunological findings in serum: Secondary | ICD-10-CM | POA: Diagnosis not present

## 2015-04-20 ENCOUNTER — Other Ambulatory Visit: Payer: Self-pay

## 2015-04-20 ENCOUNTER — Ambulatory Visit (INDEPENDENT_AMBULATORY_CARE_PROVIDER_SITE_OTHER): Payer: Medicare Other

## 2015-04-20 DIAGNOSIS — I059 Rheumatic mitral valve disease, unspecified: Secondary | ICD-10-CM

## 2015-05-07 ENCOUNTER — Ambulatory Visit: Payer: Medicare Other | Attending: Internal Medicine | Admitting: Occupational Therapy

## 2015-05-07 ENCOUNTER — Other Ambulatory Visit: Payer: Self-pay | Admitting: Family Medicine

## 2015-05-13 ENCOUNTER — Other Ambulatory Visit: Payer: Self-pay | Admitting: Family Medicine

## 2015-06-01 ENCOUNTER — Ambulatory Visit (INDEPENDENT_AMBULATORY_CARE_PROVIDER_SITE_OTHER): Payer: Medicare Other | Admitting: Family Medicine

## 2015-06-01 ENCOUNTER — Encounter: Payer: Self-pay | Admitting: Family Medicine

## 2015-06-01 VITALS — BP 138/75 | HR 76 | Temp 98.1°F | Resp 15 | Ht 73.0 in | Wt 178.0 lb

## 2015-06-01 DIAGNOSIS — E785 Hyperlipidemia, unspecified: Secondary | ICD-10-CM

## 2015-06-01 DIAGNOSIS — I10 Essential (primary) hypertension: Secondary | ICD-10-CM

## 2015-06-01 DIAGNOSIS — F419 Anxiety disorder, unspecified: Secondary | ICD-10-CM

## 2015-06-01 MED ORDER — LISINOPRIL 20 MG PO TABS: 20.0000 mg | ORAL_TABLET | Freq: Every day | ORAL | Status: DC

## 2015-06-01 MED ORDER — ROSUVASTATIN CALCIUM 40 MG PO TABS: 40.0000 mg | ORAL_TABLET | Freq: Every day | ORAL | Status: DC

## 2015-06-01 MED ORDER — ALPRAZOLAM 0.5 MG PO TABS: 0.5000 mg | ORAL_TABLET | Freq: Every evening | ORAL | Status: DC | PRN

## 2015-06-01 NOTE — Progress Notes (Signed)
Name: [name] Browning   MRN: [id number]    DOB: March 24, 1940   Date:06/01/2015       Progress Note  Subjective  Chief Complaint  Chief Complaint  Patient presents with  . Follow-up    2 mo  . Hyperlipidemia  . Anxiety  . Medication Refill    Hyperlipidemia [name]. The problem is uncontrolled. Recent lipid tests were reviewed and are high. Pertinent negatives include no leg pain, myalgias or shortness of breath. Chest pain: intermittent chest pain, followed by Cardiology. Current antihyperlipidemic treatment includes statins.  Anxiety Presents for follow-up visit. The problem has been gradually improving. Symptoms include excessive worry and nervous/anxious behavior. Patient reports no palpitations or shortness of breath. Chest pain: intermittent chest pain, followed by Cardiology. The symptoms are aggravated by family issues (grandkids).   Past treatments include benzodiazephines. The treatment provided significant relief. Compliance with prior treatments has been good.  Hypertension [name]. The problem is controlled. Associated symptoms include anxiety. Pertinent negatives include no headaches, palpitations or shortness of breath. Chest pain: intermittent chest pain, followed by Cardiology. Past treatments include ACE inhibitors. There are no compliance problems.  Hypertensive end-organ damage includes CVA (had a TIA.). There is no history of CAD/MI.    Past Medical History  Diagnosis Date  . COPD (chronic obstructive pulmonary disease) ([place])   . Hypertension   . HLD (hyperlipidemia)   . Normal coronary arteries     a. by cardiac cath in 2004; b. normal stress test in 2007 and 2011; c. Lexiscan 05/20/2014: no significant ischemia, EF 55-65%, no EKG changes, low risk study   . Syncope   . Stroke [facility])     TIA in 2016  . Carpal tunnel syndrome     Both wrists.  Delmar Landau heart valve   . Kidney stones     Past Surgical History  Procedure  Laterality Date  . Carpal tunnel release    . Thyroidectomy  1950    Not sure if total or partial thyroidectomy.  . Cardiac catheterization      Family History  Problem Relation Age of Onset  . Hypertension Mother   . Heart disease Mother   . CAD Father   . Heart attack Father     Social History   Social History  . Marital Status: Single    Spouse Name: N/A  . Number of Children: N/A  . Years of Education: N/A   Occupational History  . Not on file.   Social History Main Topics  . Smoking status: Former Smoker    Types: Cigarettes    Quit date: 02/11/2003  . Smokeless tobacco: Not on file  . Alcohol Use: 4.2 oz/week    7 Cans of beer per week  . Drug Use: No  . Sexual Activity: No   Other Topics Concern  . Not on file   Social History Narrative     Current outpatient prescriptions:  .  albuterol (PROVENTIL HFA;VENTOLIN HFA) 108 (90 BASE) MCG/ACT inhaler, Inhale 1 puff into the lungs every 6 (six) hours as needed for wheezing or shortness of breath., Disp: , Rfl:  .  ALPRAZolam (XANAX) 0.5 MG tablet, TAKE 1 TABLET BY MOUTH AT BEDTIME AS NEEDED FOR ANXIETY, Disp: 30 tablet, Rfl: 0 .  diclofenac sodium (VOLTAREN) 1 % GEL, Apply 2 g topically 3 (three) times daily. Apply to both hands and wrists three times daily., Disp: 100 g, Rfl: 0 .  lisinopril (  PRINIVIL,ZESTRIL) 20 MG tablet, Take 1 tablet (20 mg total) by mouth daily., Disp: 30 tablet, Rfl: 2 .  rosuvastatin (CRESTOR) 40 MG tablet, Take 1 tablet (40 mg total) by mouth daily., Disp: 90 tablet, Rfl: 0  No Known Allergies   Review of Systems  Constitutional: Negative for fever and chills.  Respiratory: Negative for shortness of breath.   Cardiovascular: Negative for palpitations. Chest pain: intermittent chest pain, followed by Cardiology.  Musculoskeletal: Negative for myalgias.  Neurological: Negative for headaches.  Psychiatric/Behavioral: The patient is nervous/anxious.     Objective  Filed Vitals:    06/01/15 0949  BP: 138/75  Pulse: 76  Temp: 98.1 F (36.7 C)  TempSrc: Oral  Resp: 15  Height: 6\' 1"  (1.854 m)  Weight: 178 lb (80.74 kg)  SpO2: 95%    Physical Exam  Constitutional: He is oriented to person, place, and time and well-developed, well-nourished, and in no distress.  HENT:  Head: Normocephalic and atraumatic.  Cardiovascular: Normal rate and regular rhythm.   Pulmonary/Chest: Effort normal and breath sounds normal.  Abdominal: Soft. Bowel sounds are normal.  Neurological: He is alert and oriented to person, place, and time.  Psychiatric: Mood, memory, affect and judgment normal.  Nursing note and vitals reviewed.   Assessment & Plan  1. Hyperlipidemia Repeat FLP, having switched patient to Crestor 40 mg and bedtime since February 2017 - rosuvastatin (CRESTOR) 40 MG tablet; Take 1 tablet (40 mg total) by mouth daily.  Dispense: 90 tablet; Refill: 0 - Lipid Profile - Comprehensive Metabolic Panel (CMET)  2. Anxiety Stable, responsive to alprazolam taken at bedtime as needed - ALPRAZolam (XANAX) 0.5 MG tablet; Take 1 tablet (0.5 mg total) by mouth at bedtime as needed for anxiety.  Dispense: 30 tablet; Refill: 2  3. Essential hypertension  - lisinopril (PRINIVIL,ZESTRIL) 20 MG tablet; Take 1 tablet (20 mg total) by mouth daily.  Dispense: 90 tablet; Refill: 0   Miyeko Mahlum Asad A. [facility] 06/01/2015 9:56 AM

## 2015-06-02 LAB — LIPID PANEL
CHOLESTEROL TOTAL: 175 mg/dL (ref 100–199)
Chol/HDL Ratio: 2.5 ratio units (ref 0.0–5.0)
HDL: 70 mg/dL (ref >39)
LDL Calculated: 95 mg/dL (ref 0–99)
Triglycerides: 49 mg/dL (ref 0–149)
VLDL Cholesterol Cal: 10 mg/dL (ref 5–40)

## 2015-06-02 LAB — COMPREHENSIVE METABOLIC PANEL
ALBUMIN: 4.4 g/dL (ref 3.5–4.8)
ALT: 13 IU/L (ref 0–44)
AST: 25 IU/L (ref 0–40)
Albumin/Globulin Ratio: 1.8 (ref 1.2–2.2)
Alkaline Phosphatase: 123 IU/L — ABNORMAL HIGH (ref 39–117)
BUN / CREAT RATIO: 15 (ref 10–24)
BUN: 16 mg/dL (ref 8–27)
Bilirubin Total: 0.5 mg/dL (ref 0.0–1.2)
CALCIUM: 9.2 mg/dL (ref 8.6–10.2)
CO2: 23 mmol/L (ref 18–29)
CREATININE: 1.05 mg/dL (ref 0.76–1.27)
Chloride: 104 mmol/L (ref 96–106)
GFR calc Af Amer: 81 mL/min/{1.73_m2} (ref >59)
GFR, EST NON AFRICAN AMERICAN: 70 mL/min/{1.73_m2} (ref >59)
GLOBULIN, TOTAL: 2.5 g/dL (ref 1.5–4.5)
GLUCOSE: 102 mg/dL — AB (ref 65–99)
Potassium: 5.1 mmol/L (ref 3.5–5.2)
SODIUM: 143 mmol/L (ref 134–144)
Total Protein: 6.9 g/dL (ref 6.0–8.5)

## 2015-09-01 ENCOUNTER — Encounter: Payer: Self-pay | Admitting: Family Medicine

## 2015-09-01 ENCOUNTER — Ambulatory Visit (INDEPENDENT_AMBULATORY_CARE_PROVIDER_SITE_OTHER): Payer: Medicare Other | Admitting: Family Medicine

## 2015-09-01 DIAGNOSIS — F419 Anxiety disorder, unspecified: Secondary | ICD-10-CM | POA: Diagnosis not present

## 2015-09-01 DIAGNOSIS — R739 Hyperglycemia, unspecified: Secondary | ICD-10-CM | POA: Diagnosis not present

## 2015-09-01 DIAGNOSIS — I10 Essential (primary) hypertension: Secondary | ICD-10-CM | POA: Diagnosis not present

## 2015-09-01 DIAGNOSIS — M1990 Unspecified osteoarthritis, unspecified site: Secondary | ICD-10-CM | POA: Diagnosis not present

## 2015-09-01 DIAGNOSIS — M19042 Primary osteoarthritis, left hand: Secondary | ICD-10-CM

## 2015-09-01 DIAGNOSIS — M19041 Primary osteoarthritis, right hand: Secondary | ICD-10-CM

## 2015-09-01 DIAGNOSIS — E785 Hyperlipidemia, unspecified: Secondary | ICD-10-CM

## 2015-09-01 DIAGNOSIS — Z23 Encounter for immunization: Secondary | ICD-10-CM | POA: Diagnosis not present

## 2015-09-01 LAB — LIPID PANEL
CHOLESTEROL: 170 mg/dL (ref 125–200)
HDL: 63 mg/dL (ref >=40)
LDL Cholesterol: 95 mg/dL (ref <130)
TRIGLYCERIDES: 61 mg/dL (ref <150)
Total CHOL/HDL Ratio: 2.7 Ratio (ref <=5.0)
VLDL: 12 mg/dL (ref <30)

## 2015-09-01 LAB — COMPLETE METABOLIC PANEL WITH GFR
ALBUMIN: 3.9 g/dL (ref 3.6–5.1)
ALK PHOS: 90 U/L (ref 40–115)
ALT: 13 U/L (ref 9–46)
AST: 20 U/L (ref 10–35)
BILIRUBIN TOTAL: 0.5 mg/dL (ref 0.2–1.2)
BUN: 22 mg/dL (ref 7–25)
CALCIUM: 9.1 mg/dL (ref 8.6–10.3)
CO2: 26 mmol/L (ref 20–31)
CREATININE: 0.97 mg/dL (ref 0.70–1.18)
Chloride: 107 mmol/L (ref 98–110)
GFR, EST AFRICAN AMERICAN: 89 mL/min (ref >=60)
GFR, Est Non African American: 77 mL/min (ref >=60)
Glucose, Bld: 98 mg/dL (ref 65–99)
Potassium: 4.6 mmol/L (ref 3.5–5.3)
Sodium: 141 mmol/L (ref 135–146)
TOTAL PROTEIN: 6.7 g/dL (ref 6.1–8.1)

## 2015-09-01 LAB — GLUCOSE, POCT (MANUAL RESULT ENTRY): POC GLUCOSE: 92 mg/dL (ref 70–99)

## 2015-09-01 LAB — POCT GLYCOSYLATED HEMOGLOBIN (HGB A1C): HEMOGLOBIN A1C: 5.7

## 2015-09-01 MED ORDER — ALPRAZOLAM 0.5 MG PO TABS: 0.5000 mg | ORAL_TABLET | Freq: Two times a day (BID) | ORAL | 2 refills | Status: DC | PRN

## 2015-09-01 MED ORDER — DICLOFENAC SODIUM 1 % TD GEL: 2.0000 g | Freq: Three times a day (TID) | TRANSDERMAL | 0 refills | Status: DC

## 2015-09-01 MED ORDER — ROSUVASTATIN CALCIUM 40 MG PO TABS: 40.0000 mg | ORAL_TABLET | Freq: Every day | ORAL | 0 refills | Status: DC

## 2015-09-01 MED ORDER — LISINOPRIL 20 MG PO TABS: 20.0000 mg | ORAL_TABLET | Freq: Every day | ORAL | 0 refills | Status: DC

## 2015-09-01 NOTE — Progress Notes (Signed)
Name: [name] Browning   MRN: [id number]    DOB: [birth year]   Date:09/01/2015       Progress Note  Subjective  Chief Complaint  Chief Complaint  Patient presents with  . Hypertension    3 month follow up  . Hyperlipidemia  . Anxiety    Hypertension  [name]. The problem is unchanged. Associated symptoms include anxiety and chest pain (gets chest pains every once in a while, none present today). Pertinent negatives include no blurred vision, headaches, palpitations or shortness of breath. Past treatments include ACE inhibitors. Hypertensive end-organ damage includes CVA (TIA). There is no history of kidney disease or CAD/MI.  Hyperlipidemia  [name]. The problem is controlled. Recent lipid tests were reviewed and are normal. Associated symptoms include chest pain (gets chest pains every once in a while, none present today). Pertinent negatives include no myalgias (wakes up with leg cramps in the morning.) or shortness of breath. Current antihyperlipidemic treatment includes statins.  Anxiety  Presents for follow-up visit. Symptoms include chest pain (gets chest pains every once in a while, none present today), excessive worry, insomnia and nervous/anxious behavior. Patient reports no irritability, palpitations, panic, restlessness or shortness of breath. The quality of sleep is fair.    Patient feels as Requesting to increase alprazolam because it is not adequately addressing his symptoms of anxiety. Past Medical History:  Diagnosis Date  . Carpal tunnel syndrome    Both wrists.  Marland Kitchen COPD (chronic obstructive pulmonary disease) ([place])   . HLD (hyperlipidemia)   . Hypertension   . Kidney stones   . Leaky heart valve   . Normal coronary arteries    a. by cardiac cath in 2004; b. normal stress test in 2007 and 2011; c. Lexiscan 05/20/2014: no significant ischemia, EF 55-65%, no EKG changes, low risk study   . Stroke [facility])    TIA in 2016  . Syncope      Past Surgical History:  Procedure Laterality Date  . CARDIAC CATHETERIZATION    . CARPAL TUNNEL RELEASE    . THYROIDECTOMY  1950   Not sure if total or partial thyroidectomy.    Family History  Problem Relation Age of Onset  . Hypertension Mother   . Heart disease Mother   . CAD Father   . Heart attack Father     Social History   Social History  . Marital status: Single    Spouse name: N/A  . Number of children: N/A  . Years of education: N/A   Occupational History  . Not on file.   Social History Main Topics  . Smoking status: Former Smoker    Types: Cigarettes    Quit date: 02/11/2003  . Smokeless tobacco: Not on file  . Alcohol use 4.2 oz/week    7 Cans of beer per week  . Drug use: No  . Sexual activity: No   Other Topics Concern  . Not on file   Social History Narrative  . No narrative on file     Current Outpatient Prescriptions:  .  albuterol (PROVENTIL HFA;VENTOLIN HFA) 108 (90 BASE) MCG/ACT inhaler, Inhale 1 puff into the lungs every 6 (six) hours as needed for wheezing or shortness of breath., Disp: , Rfl:  .  ALPRAZolam (XANAX) 0.5 MG tablet, Take 1 tablet (0.5 mg total) by mouth at bedtime as needed for anxiety., Disp: 30 tablet, Rfl: 2 .  diclofenac sodium (VOLTAREN) 1 % GEL, Apply 2  g topically 3 (three) times daily. Apply to both hands and wrists three times daily., Disp: 100 g, Rfl: 0 .  lisinopril (PRINIVIL,ZESTRIL) 20 MG tablet, Take 1 tablet (20 mg total) by mouth daily., Disp: 90 tablet, Rfl: 0 .  rosuvastatin (CRESTOR) 40 MG tablet, Take 1 tablet (40 mg total) by mouth daily., Disp: 90 tablet, Rfl: 0  No Known Allergies   Review of Systems  Constitutional: Negative for irritability.  Eyes: Negative for blurred vision.  Respiratory: Negative for shortness of breath.   Cardiovascular: Positive for chest pain (gets chest pains every once in a while, none present today). Negative for palpitations.  Musculoskeletal: Negative for myalgias  (wakes up with leg cramps in the morning.).  Neurological: Negative for headaches.  Psychiatric/Behavioral: The patient is nervous/anxious and has insomnia.      Objective  Vitals:   09/01/15 0911  BP: 126/70  Pulse: 68  Resp: 16  Temp: 97.9 F (36.6 C)  TempSrc: Oral  SpO2: 95%  Weight: 178 lb 12.8 oz (81.1 kg)  Height: 6\' 1"  (1.854 m)    Physical Exam  Constitutional: He is oriented to person, place, and time and well-developed, well-nourished, and in no distress.  HENT:  Head: Normocephalic and atraumatic.  Cardiovascular: Normal rate, regular rhythm, S1 normal, S2 normal and normal heart sounds.   No murmur heard. Pulmonary/Chest: Effort normal and breath sounds normal. He has no wheezes.  Abdominal: Soft. Bowel sounds are normal. There is no tenderness.  Musculoskeletal: Normal range of motion. He exhibits no edema.       Right ankle: He exhibits no swelling.       Left ankle: He exhibits no swelling.  Neurological: He is alert and oriented to person, place, and time.  Psychiatric: Mood, memory, affect and judgment normal.  Nursing note and vitals reviewed.   Assessment & Plan  1. Anxiety We will increase Alprazolam from once a day to twice a day as needed. Should improve and address his symptoms of anxiety - ALPRAZolam (XANAX) 0.5 MG tablet; Take 1 tablet (0.5 mg total) by mouth 2 (two) times daily as needed for anxiety.  Dispense: 60 tablet; Refill: 2  2. Arthritis of both hands Continue to use Voltaren gel as prescribed - diclofenac sodium (VOLTAREN) 1 % GEL; Apply 2 g topically 3 (three) times daily. Apply to both hands and wrists three times daily.  Dispense: 100 g; Refill: 0  3. Essential hypertension BP at goal and controlled on present antihypertensive therapy - lisinopril (PRINIVIL,ZESTRIL) 20 MG tablet; Take 1 tablet (20 mg total) by mouth daily.  Dispense: 90 tablet; Refill: 0  4. Hyperlipidemia  - Lipid Profile - COMPLETE METABOLIC PANEL WITH  GFR - rosuvastatin (CRESTOR) 40 MG tablet; Take 1 tablet (40 mg total) by mouth daily.  Dispense: 90 tablet; Refill: 0  5. Hyperglycemia  A1c is 5.7%, considered prediabetes - POCT HgB A1C   [name] Browning 09/01/2015 9:26 AM

## 2015-10-05 ENCOUNTER — Other Ambulatory Visit: Payer: Self-pay | Admitting: Family Medicine

## 2015-10-05 DIAGNOSIS — E785 Hyperlipidemia, unspecified: Secondary | ICD-10-CM

## 2015-10-06 ENCOUNTER — Telehealth: Payer: Self-pay | Admitting: Family Medicine

## 2015-10-06 NOTE — Telephone Encounter (Signed)
Pt needs refill on Crestor to be sent to [facility]. Please advise.

## 2015-10-06 NOTE — Telephone Encounter (Signed)
Tried calling pt to inform of medication refills. Caller was not available and no VM.

## 2015-10-06 NOTE — Telephone Encounter (Signed)
90 day prescription for Crestor was sent on 09/01/2015. He should  contact his pharmacy

## 2015-12-02 ENCOUNTER — Other Ambulatory Visit: Payer: Self-pay | Admitting: Family Medicine

## 2015-12-02 DIAGNOSIS — F419 Anxiety disorder, unspecified: Secondary | ICD-10-CM

## 2015-12-07 ENCOUNTER — Ambulatory Visit (INDEPENDENT_AMBULATORY_CARE_PROVIDER_SITE_OTHER): Payer: Medicare Other | Admitting: Family Medicine

## 2015-12-07 ENCOUNTER — Encounter: Payer: Self-pay | Admitting: Family Medicine

## 2015-12-07 DIAGNOSIS — F419 Anxiety disorder, unspecified: Secondary | ICD-10-CM | POA: Diagnosis not present

## 2015-12-07 DIAGNOSIS — I10 Essential (primary) hypertension: Secondary | ICD-10-CM | POA: Diagnosis not present

## 2015-12-07 DIAGNOSIS — H6123 Impacted cerumen, bilateral: Secondary | ICD-10-CM | POA: Diagnosis not present

## 2015-12-07 DIAGNOSIS — E785 Hyperlipidemia, unspecified: Secondary | ICD-10-CM

## 2015-12-07 MED ORDER — LISINOPRIL 20 MG PO TABS: 20.0000 mg | ORAL_TABLET | Freq: Every day | ORAL | 0 refills | Status: DC

## 2015-12-07 MED ORDER — ROSUVASTATIN CALCIUM 40 MG PO TABS: 40.0000 mg | ORAL_TABLET | Freq: Every day | ORAL | 0 refills | Status: DC

## 2015-12-07 MED ORDER — ALPRAZOLAM 0.5 MG PO TABS: 0.5000 mg | ORAL_TABLET | Freq: Two times a day (BID) | ORAL | 2 refills | Status: DC | PRN

## 2015-12-07 NOTE — Progress Notes (Signed)
Name: [name] Browning   MRN: [id number]    DOB: [birth year]   Date:12/07/2015       Progress Note  Subjective  Chief Complaint  Chief Complaint  Patient presents with  . Follow-up    3 mo  . Medication Refill    xanax    Anxiety  Presents for follow-up visit. Symptoms include excessive worry and nervous/anxious behavior. Patient reports no chest pain, depressed mood, insomnia, irritability, palpitations, panic or shortness of breath. The severity of symptoms is moderate. The quality of sleep is good.    Hyperlipidemia  [name]. The problem is controlled. Recent lipid tests were reviewed and are high. Pertinent negatives include no chest pain, leg pain, myalgias or shortness of breath. Current antihyperlipidemic treatment includes statins.  Hypertension  [name]. The problem is controlled. Associated symptoms include anxiety. Pertinent negatives include no blurred vision, chest pain, headaches, palpitations or shortness of breath. Past treatments include ACE inhibitors. There are no compliance problems.  Hypertensive end-organ damage includes CVA (had a TIA.). There is no history of CAD/MI.     Past Medical History:  Diagnosis Date  . Carpal tunnel syndrome    Both wrists.  Marland Kitchen COPD (chronic obstructive pulmonary disease) ([place])   . HLD (hyperlipidemia)   . Hypertension   . Kidney stones   . Leaky heart valve   . Normal coronary arteries    a. by cardiac cath in 2004; b. normal stress test in 2007 and 2011; c. Lexiscan 05/20/2014: no significant ischemia, EF 55-65%, no EKG changes, low risk study   . Stroke [facility])    TIA in 2016  . Syncope     Past Surgical History:  Procedure Laterality Date  . CARDIAC CATHETERIZATION    . CARPAL TUNNEL RELEASE    . THYROIDECTOMY  1950   Not sure if total or partial thyroidectomy.    Family History  Problem Relation Age of Onset  . Hypertension Mother   . Heart disease Mother   . CAD Father   . Heart  attack Father     Social History   Social History  . Marital status: Single    Spouse name: N/A  . Number of children: N/A  . Years of education: N/A   Occupational History  . Not on file.   Social History Main Topics  . Smoking status: Former Smoker    Types: Cigarettes    Quit date: 02/11/2003  . Smokeless tobacco: Never Used  . Alcohol use 4.2 oz/week    7 Cans of beer per week  . Drug use: No  . Sexual activity: No   Other Topics Concern  . Not on file   Social History Narrative  . No narrative on file     Current Outpatient Prescriptions:  .  albuterol (PROVENTIL HFA;VENTOLIN HFA) 108 (90 BASE) MCG/ACT inhaler, Inhale 1 puff into the lungs every 6 (six) hours as needed for wheezing or shortness of breath., Disp: , Rfl:  .  ALPRAZolam (XANAX) 0.5 MG tablet, Take 1 tablet (0.5 mg total) by mouth 2 (two) times daily as needed for anxiety., Disp: 60 tablet, Rfl: 2 .  CRESTOR 40 MG tablet, TAKE 1 TABLET (40 MG TOTAL) BY MOUTH DAILY., Disp: 90 tablet, Rfl: 0 .  diclofenac sodium (VOLTAREN) 1 % GEL, Apply 2 g topically 3 (three) times daily. Apply to both hands and wrists three times daily., Disp: 100 g, Rfl: 0 .  lisinopril (PRINIVIL,ZESTRIL)  20 MG tablet, Take 1 tablet (20 mg total) by mouth daily., Disp: 90 tablet, Rfl: 0 .  rosuvastatin (CRESTOR) 40 MG tablet, Take 1 tablet (40 mg total) by mouth daily., Disp: 90 tablet, Rfl: 0  No Known Allergies   Review of Systems  Constitutional: Negative for irritability.  Eyes: Negative for blurred vision.  Respiratory: Negative for shortness of breath.   Cardiovascular: Negative for chest pain and palpitations.  Musculoskeletal: Negative for myalgias.  Neurological: Negative for headaches.  Psychiatric/Behavioral: The patient is nervous/anxious. The patient does not have insomnia.     Objective  Vitals:   12/07/15 1008  BP: 123/70  Pulse: 75  Resp: 16  Temp: 97.6 F (36.4 C)  TempSrc: Oral  SpO2: 96%  Weight: 180  lb (81.6 kg)  Height: 6\' 1"  (1.854 m)    Physical Exam  Constitutional: He is oriented to person, place, and time and well-developed, well-nourished, and in no distress.  HENT:  Head: Normocephalic and atraumatic.  Bilateral cerumen impaction.  Cardiovascular: Normal rate, regular rhythm and normal heart sounds.   No murmur heard. Pulmonary/Chest: Effort normal and breath sounds normal. He has no wheezes.  Abdominal: Soft. Bowel sounds are normal. There is no tenderness.  Neurological: He is alert and oriented to person, place, and time.  Skin: Skin is warm and dry.  Psychiatric: Mood, memory, affect and judgment normal.  Nursing note and vitals reviewed.     Assessment & Plan  1. Anxiety  - ALPRAZolam (XANAX) 0.5 MG tablet; Take 1 tablet (0.5 mg total) by mouth 2 (two) times daily as needed for anxiety.  Dispense: 60 tablet; Refill: 2  2. Essential hypertension  - lisinopril (PRINIVIL,ZESTRIL) 20 MG tablet; Take 1 tablet (20 mg total) by mouth daily.  Dispense: 90 tablet; Refill: 0  3. Hyperlipidemia, unspecified hyperlipidemia type  - rosuvastatin (CRESTOR) 40 MG tablet; Take 1 tablet (40 mg total) by mouth daily.  Dispense: 90 tablet; Refill: 0  4. Bilateral impacted cerumen  - Ear cerumen removal   Makaila Windle [facility] 12/07/2015 10:29 AM

## 2016-01-12 ENCOUNTER — Encounter: Payer: Medicare Other | Admitting: Family Medicine

## 2016-02-10 ENCOUNTER — Encounter: Payer: Medicare Other | Admitting: Family Medicine

## 2016-02-11 ENCOUNTER — Encounter: Payer: Self-pay | Admitting: Family Medicine

## 2016-02-11 ENCOUNTER — Ambulatory Visit (INDEPENDENT_AMBULATORY_CARE_PROVIDER_SITE_OTHER): Payer: Medicare Other | Admitting: Family Medicine

## 2016-02-11 NOTE — Progress Notes (Signed)
This encounter was created in error - please disregard.

## 2016-02-15 ENCOUNTER — Ambulatory Visit: Payer: Medicare Other

## 2016-02-22 ENCOUNTER — Ambulatory Visit (INDEPENDENT_AMBULATORY_CARE_PROVIDER_SITE_OTHER): Payer: Medicare Other

## 2016-02-22 VITALS — BP 150/82 | HR 60 | Temp 97.2°F | Ht 73.0 in | Wt 177.8 lb

## 2016-02-22 DIAGNOSIS — Z0000 Encounter for general adult medical examination without abnormal findings: Secondary | ICD-10-CM | POA: Diagnosis not present

## 2016-02-22 DIAGNOSIS — Z23 Encounter for immunization: Secondary | ICD-10-CM

## 2016-02-22 NOTE — Patient Instructions (Signed)
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
## 2016-02-29 ENCOUNTER — Encounter: Payer: Self-pay | Admitting: Family Medicine

## 2016-02-29 ENCOUNTER — Ambulatory Visit (INDEPENDENT_AMBULATORY_CARE_PROVIDER_SITE_OTHER): Payer: Medicare Other | Admitting: Family Medicine

## 2016-02-29 VITALS — BP 147/78 | HR 70 | Temp 97.8°F | Resp 15 | Ht 73.0 in | Wt 178.7 lb

## 2016-02-29 DIAGNOSIS — F419 Anxiety disorder, unspecified: Secondary | ICD-10-CM | POA: Diagnosis not present

## 2016-02-29 DIAGNOSIS — Z1211 Encounter for screening for malignant neoplasm of colon: Secondary | ICD-10-CM | POA: Diagnosis not present

## 2016-02-29 DIAGNOSIS — E785 Hyperlipidemia, unspecified: Secondary | ICD-10-CM | POA: Diagnosis not present

## 2016-02-29 DIAGNOSIS — R739 Hyperglycemia, unspecified: Secondary | ICD-10-CM | POA: Diagnosis not present

## 2016-02-29 DIAGNOSIS — I10 Essential (primary) hypertension: Secondary | ICD-10-CM | POA: Diagnosis not present

## 2016-02-29 DIAGNOSIS — Z125 Encounter for screening for malignant neoplasm of prostate: Secondary | ICD-10-CM

## 2016-02-29 DIAGNOSIS — R21 Rash and other nonspecific skin eruption: Secondary | ICD-10-CM

## 2016-02-29 DIAGNOSIS — E559 Vitamin D deficiency, unspecified: Secondary | ICD-10-CM | POA: Diagnosis not present

## 2016-02-29 DIAGNOSIS — Z0000 Encounter for general adult medical examination without abnormal findings: Secondary | ICD-10-CM

## 2016-02-29 DIAGNOSIS — N429 Disorder of prostate, unspecified: Secondary | ICD-10-CM | POA: Diagnosis not present

## 2016-02-29 LAB — CBC WITH DIFFERENTIAL/PLATELET
BASOS PCT: 1 %
Basophils Absolute: 62 cells/uL (ref 0–200)
EOS ABS: 186 {cells}/uL (ref 15–500)
Eosinophils Relative: 3 %
HCT: 40.7 % (ref 38.5–50.0)
HEMOGLOBIN: 13.4 g/dL (ref 13.2–17.1)
LYMPHS ABS: 1302 {cells}/uL (ref 850–3900)
LYMPHS PCT: 21 %
MCH: 29.8 pg (ref 27.0–33.0)
MCHC: 32.9 g/dL (ref 32.0–36.0)
MCV: 90.4 fL (ref 80.0–100.0)
MONO ABS: 496 {cells}/uL (ref 200–950)
MPV: 9.5 fL (ref 7.5–12.5)
Monocytes Relative: 8 %
Neutro Abs: 4154 cells/uL (ref 1500–7800)
Neutrophils Relative %: 67 %
Platelets: 158 10*3/uL (ref 140–400)
RBC: 4.5 MIL/uL (ref 4.20–5.80)
RDW: 13.2 % (ref 11.0–15.0)
WBC: 6.2 10*3/uL (ref 3.8–10.8)

## 2016-02-29 LAB — TSH: TSH: 1.34 mIU/L (ref 0.40–4.50)

## 2016-02-29 LAB — PSA: PSA: 2.8 ng/mL (ref <=4.0)

## 2016-02-29 MED ORDER — ALPRAZOLAM 0.5 MG PO TABS: 0.5000 mg | ORAL_TABLET | Freq: Two times a day (BID) | ORAL | 2 refills | Status: DC | PRN
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
## 2016-03-01 LAB — VITAMIN D 25 HYDROXY (VIT D DEFICIENCY, FRACTURES): Vit D, 25-Hydroxy: 26 ng/mL — ABNORMAL LOW (ref 30–100)

## 2016-03-02 ENCOUNTER — Other Ambulatory Visit: Payer: Self-pay | Admitting: Emergency Medicine

## 2016-03-02 MED ORDER — VITAMIN D (ERGOCALCIFEROL) 1.25 MG (50000 UNIT) PO CAPS: 50000.0000 [IU] | ORAL_CAPSULE | ORAL | 0 refills | Status: DC

## 2016-03-05 ENCOUNTER — Other Ambulatory Visit: Payer: Self-pay | Admitting: Family Medicine

## 2016-03-05 DIAGNOSIS — I10 Essential (primary) hypertension: Secondary | ICD-10-CM

## 2016-03-10 DIAGNOSIS — L718 Other rosacea: Secondary | ICD-10-CM | POA: Diagnosis not present

## 2016-04-03 ENCOUNTER — Other Ambulatory Visit: Payer: Self-pay | Admitting: Family Medicine

## 2016-04-03 DIAGNOSIS — E785 Hyperlipidemia, unspecified: Secondary | ICD-10-CM

## 2016-05-11 DIAGNOSIS — L718 Other rosacea: Secondary | ICD-10-CM | POA: Diagnosis not present

## 2016-05-11 DIAGNOSIS — D0439 Carcinoma in situ of skin of other parts of face: Secondary | ICD-10-CM | POA: Diagnosis not present

## 2016-05-11 DIAGNOSIS — D0321 Melanoma in situ of right ear and external auricular canal: Secondary | ICD-10-CM | POA: Diagnosis not present

## 2016-05-11 DIAGNOSIS — D485 Neoplasm of uncertain behavior of skin: Secondary | ICD-10-CM | POA: Diagnosis not present

## 2016-05-22 ENCOUNTER — Other Ambulatory Visit: Payer: Self-pay | Admitting: Family Medicine

## 2016-05-27 ENCOUNTER — Other Ambulatory Visit: Payer: Self-pay | Admitting: Family Medicine

## 2016-05-27 ENCOUNTER — Encounter: Payer: Self-pay | Admitting: Family Medicine

## 2016-05-27 ENCOUNTER — Ambulatory Visit (INDEPENDENT_AMBULATORY_CARE_PROVIDER_SITE_OTHER): Payer: Medicare Other | Admitting: Family Medicine

## 2016-05-27 VITALS — BP 136/69 | HR 88 | Temp 97.8°F | Resp 16 | Ht 73.0 in | Wt 175.2 lb

## 2016-05-27 DIAGNOSIS — E785 Hyperlipidemia, unspecified: Secondary | ICD-10-CM

## 2016-05-27 DIAGNOSIS — F419 Anxiety disorder, unspecified: Secondary | ICD-10-CM

## 2016-05-27 DIAGNOSIS — I10 Essential (primary) hypertension: Secondary | ICD-10-CM | POA: Diagnosis not present

## 2016-05-27 MED ORDER — ROSUVASTATIN CALCIUM 40 MG PO TABS: 40.0000 mg | ORAL_TABLET | Freq: Every day | ORAL | 0 refills | Status: DC

## 2016-05-27 MED ORDER — ALPRAZOLAM 0.5 MG PO TABS: 0.5000 mg | ORAL_TABLET | Freq: Two times a day (BID) | ORAL | 2 refills | Status: DC | PRN

## 2016-05-27 MED ORDER — LISINOPRIL 20 MG PO TABS: 20.0000 mg | ORAL_TABLET | Freq: Every day | ORAL | 0 refills | Status: DC

## 2016-05-27 NOTE — Progress Notes (Signed)
Name: [name] Browning   MRN: [id number]    DOB: August 08, 1940   Date:05/27/2016       Progress Note  Subjective  Chief Complaint  Chief Complaint  Patient presents with  . Follow-up    3 mo  . Medication Refill    Anxiety  Presents for follow-up visit. Symptoms include excessive worry and nervous/anxious behavior. Patient reports no chest pain, depressed mood, insomnia, irritability, palpitations, panic or shortness of breath. The severity of symptoms is moderate. The quality of sleep is good.    Hyperlipidemia  [name]. The problem is controlled. Recent lipid tests were reviewed and are high. Pertinent negatives include no chest pain, leg pain, myalgias or shortness of breath. Current antihyperlipidemic treatment includes statins.  Hypertension  [name]. The problem is unchanged. The problem is controlled. Associated symptoms include anxiety. Pertinent negatives include no blurred vision, chest pain, headaches, palpitations or shortness of breath. Past treatments include ACE inhibitors. There are no compliance problems.  Hypertensive end-organ damage includes CVA (had a TIA.). There is no history of CAD/MI.     Past Medical History:  Diagnosis Date  . Carpal tunnel syndrome    Both wrists.  Marland Kitchen COPD (chronic obstructive pulmonary disease) ([place])   . HLD (hyperlipidemia)   . Hypertension   . Kidney stones   . Leaky heart valve   . Normal coronary arteries    a. by cardiac cath in 2004; b. normal stress test in 2007 and 2011; c. Lexiscan 05/20/2014: no significant ischemia, EF 55-65%, no EKG changes, low risk study   . Stroke [facility])    TIA in 2016  . Syncope     Past Surgical History:  Procedure Laterality Date  . CARDIAC CATHETERIZATION    . CARPAL TUNNEL RELEASE    . THYROIDECTOMY  1950   Not sure if total or partial thyroidectomy.    Family History  Problem Relation Age of Onset  . Hypertension Mother   . Heart disease Mother   . CAD  Father   . Heart attack Father     Social History   Social History  . Marital status: Single    Spouse name: N/A  . Number of children: N/A  . Years of education: N/A   Occupational History  . Not on file.   Social History Main Topics  . Smoking status: Former Smoker    Types: Cigarettes    Quit date: 02/11/2003  . Smokeless tobacco: Never Used  . Alcohol use 4.2 oz/week    7 Cans of beer per week  . Drug use: No  . Sexual activity: No   Other Topics Concern  . Not on file   Social History Narrative  . No narrative on file     Current Outpatient Prescriptions:  .  ALPRAZolam (XANAX) 0.5 MG tablet, Take 1 tablet (0.5 mg total) by mouth 2 (two) times daily as needed for anxiety., Disp: 60 tablet, Rfl: 2 .  CRESTOR 40 MG tablet, TAKE 1 TABLET (40 MG TOTAL) BY MOUTH DAILY., Disp: 90 tablet, Rfl: 0 .  lisinopril (PRINIVIL,ZESTRIL) 20 MG tablet, TAKE 1 TABLET (20 MG TOTAL) BY MOUTH DAILY., Disp: 90 tablet, Rfl: 0 .  Vitamin D, Ergocalciferol, (DRISDOL) 50000 units CAPS capsule, Take 1 capsule (50,000 Units total) by mouth every 7 (seven) days., Disp: 12 capsule, Rfl: 0  No Known Allergies   Review of Systems  Constitutional: Negative for irritability.  Eyes: Negative for blurred  vision.  Respiratory: Negative for shortness of breath.   Cardiovascular: Negative for chest pain and palpitations.  Musculoskeletal: Negative for myalgias.  Neurological: Negative for headaches.  Psychiatric/Behavioral: The patient is nervous/anxious. The patient does not have insomnia.      Objective  Vitals:   05/27/16 0941  BP: 136/69  Pulse: 88  Resp: 16  Temp: 97.8 F (36.6 C)  TempSrc: Oral  SpO2: 96%  Weight: 175 lb 3.2 oz (79.5 kg)  Height: 6\' 1"  (1.854 m)    Physical Exam  Constitutional: He is oriented to person, place, and time and well-developed, well-nourished, and in no distress.  HENT:  Head: Normocephalic and atraumatic.  Cardiovascular: Normal rate, regular  rhythm, S1 normal, S2 normal and normal heart sounds.   No murmur heard. Pulmonary/Chest: Effort normal and breath sounds normal. He has no wheezes.  Abdominal: Soft. Bowel sounds are normal. There is no tenderness.  Musculoskeletal: Normal range of motion. He exhibits no edema.       Right ankle: He exhibits no swelling.       Left ankle: He exhibits no swelling.  Neurological: He is alert and oriented to person, place, and time.  Psychiatric: Mood, memory, affect and judgment normal.  Nursing note and vitals reviewed.     Assessment & Plan  1. Hyperlipidemia, unspecified hyperlipidemia type Continue on Crestor, pain FLP - rosuvastatin (CRESTOR) 40 MG tablet; Take 1 tablet (40 mg total) by mouth daily.  Dispense: 90 tablet; Refill: 0 - Lipid panel - COMPLETE METABOLIC PANEL WITH GFR  2. Essential hypertension BP stable on present anti- hypertensive therapy - lisinopril (PRINIVIL,ZESTRIL) 20 MG tablet; Take 1 tablet (20 mg total) by mouth daily.  Dispense: 90 tablet; Refill: 0  3. Anxiety  - ALPRAZolam (XANAX) 0.5 MG tablet; Take 1 tablet (0.5 mg total) by mouth 2 (two) times daily as needed for anxiety.  Dispense: 60 tablet; Refill: 2   Tykia Mellone Asad A. [facility] 05/27/2016 9:53 AM

## 2016-06-05 ENCOUNTER — Other Ambulatory Visit: Payer: Self-pay | Admitting: Family Medicine

## 2016-06-08 DIAGNOSIS — E785 Hyperlipidemia, unspecified: Secondary | ICD-10-CM | POA: Diagnosis not present

## 2016-06-08 LAB — COMPLETE METABOLIC PANEL WITH GFR
ALBUMIN: 3.8 g/dL (ref 3.6–5.1)
ALK PHOS: 97 U/L (ref 40–115)
ALT: 14 U/L (ref 9–46)
AST: 22 U/L (ref 10–35)
BILIRUBIN TOTAL: 0.5 mg/dL (ref 0.2–1.2)
BUN: 14 mg/dL (ref 7–25)
CALCIUM: 8.7 mg/dL (ref 8.6–10.3)
CO2: 28 mmol/L (ref 20–31)
Chloride: 108 mmol/L (ref 98–110)
Creat: 1 mg/dL (ref 0.70–1.18)
GFR, EST NON AFRICAN AMERICAN: 74 mL/min (ref >=60)
GFR, Est African American: 85 mL/min (ref >=60)
Glucose, Bld: 98 mg/dL (ref 65–99)
POTASSIUM: 4.6 mmol/L (ref 3.5–5.3)
Sodium: 141 mmol/L (ref 135–146)
TOTAL PROTEIN: 6.7 g/dL (ref 6.1–8.1)

## 2016-06-08 LAB — LIPID PANEL
CHOL/HDL RATIO: 2.6 ratio (ref <5.0)
CHOLESTEROL: 164 mg/dL (ref <200)
HDL: 63 mg/dL (ref >40)
LDL Cholesterol: 92 mg/dL (ref <100)
Triglycerides: 45 mg/dL (ref <150)
VLDL: 9 mg/dL (ref <30)

## 2016-08-22 ENCOUNTER — Other Ambulatory Visit: Payer: Self-pay | Admitting: Family Medicine

## 2016-08-22 DIAGNOSIS — I10 Essential (primary) hypertension: Secondary | ICD-10-CM

## 2016-08-26 ENCOUNTER — Ambulatory Visit: Payer: Medicare Other | Admitting: Family Medicine

## 2016-08-30 ENCOUNTER — Ambulatory Visit: Payer: Medicare Other | Admitting: Family Medicine

## 2016-09-06 ENCOUNTER — Encounter: Payer: Self-pay | Admitting: Family Medicine

## 2016-09-06 ENCOUNTER — Ambulatory Visit (INDEPENDENT_AMBULATORY_CARE_PROVIDER_SITE_OTHER): Payer: Medicare Other | Admitting: Family Medicine

## 2016-09-06 VITALS — BP 138/73 | HR 70 | Temp 97.5°F | Resp 16 | Ht 73.0 in | Wt 176.8 lb

## 2016-09-06 DIAGNOSIS — E785 Hyperlipidemia, unspecified: Secondary | ICD-10-CM | POA: Diagnosis not present

## 2016-09-06 DIAGNOSIS — I10 Essential (primary) hypertension: Secondary | ICD-10-CM | POA: Diagnosis not present

## 2016-09-06 DIAGNOSIS — J0100 Acute maxillary sinusitis, unspecified: Secondary | ICD-10-CM | POA: Diagnosis not present

## 2016-09-06 DIAGNOSIS — F419 Anxiety disorder, unspecified: Secondary | ICD-10-CM | POA: Diagnosis not present

## 2016-09-06 MED ORDER — AZITHROMYCIN 250 MG PO TABS: ORAL_TABLET | ORAL | 0 refills | Status: DC

## 2016-09-06 MED ORDER — LISINOPRIL 20 MG PO TABS: 20.0000 mg | ORAL_TABLET | Freq: Every day | ORAL | 0 refills | Status: DC

## 2016-09-06 MED ORDER — ROSUVASTATIN CALCIUM 40 MG PO TABS: 40.0000 mg | ORAL_TABLET | Freq: Every day | ORAL | 0 refills | Status: DC

## 2016-09-06 MED ORDER — ALPRAZOLAM 0.5 MG PO TABS: 0.5000 mg | ORAL_TABLET | Freq: Two times a day (BID) | ORAL | 2 refills | Status: DC | PRN

## 2016-09-06 NOTE — Progress Notes (Signed)
Name: [name] Browning   MRN: [id number]    DOB: February 16, 1940   Date:09/06/2016       Progress Note  Subjective  Chief Complaint  Chief Complaint  Patient presents with  . Follow-up    3 mo  . Medication Refill  . Hyperlipidemia    Hyperlipidemia  [name]. The problem is controlled. Recent lipid tests were reviewed and are normal. Associated symptoms include chest pain (occasional left sided chest pain, intermittent, no radiation, was seen last year by Cardiology and no further work up was needed. ). Pertinent negatives include no leg pain, myalgias or shortness of breath. Current antihyperlipidemic treatment includes statins.  Anxiety  Presents for follow-up visit. Symptoms include chest pain (occasional left sided chest pain, intermittent, no radiation, was seen last year by Cardiology and no further work up was needed. ), depressed mood, excessive worry, irritability, nervous/anxious behavior, palpitations and panic. Patient reports no shortness of breath. The severity of symptoms is moderate and causing significant distress.    Hypertension  [name]. The problem is unchanged. Associated symptoms include anxiety, chest pain (occasional left sided chest pain, intermittent, no radiation, was seen last year by Cardiology and no further work up was needed. ) and palpitations. Pertinent negatives include no shortness of breath. Past treatments include ACE inhibitors.  Sinusitis  This is a new problem. The current episode started more than 1 month ago. There has been no fever. Associated symptoms include congestion, coughing, sinus pressure and sneezing. Pertinent negatives include no ear pain, shortness of breath or sore throat. Past treatments include acetaminophen. The treatment provided no relief.    Past Medical History:  Diagnosis Date  . Carpal tunnel syndrome    Both wrists.  Marland Kitchen COPD (chronic obstructive pulmonary disease) ([place])   . HLD  (hyperlipidemia)   . Hypertension   . Kidney stones   . Leaky heart valve   . Normal coronary arteries    a. by cardiac cath in 2004; b. normal stress test in 2007 and 2011; c. Lexiscan 05/20/2014: no significant ischemia, EF 55-65%, no EKG changes, low risk study   . Stroke [facility])    TIA in 2016  . Syncope     Past Surgical History:  Procedure Laterality Date  . CARDIAC CATHETERIZATION    . CARPAL TUNNEL RELEASE    . THYROIDECTOMY  1950   Not sure if total or partial thyroidectomy.    Family History  Problem Relation Age of Onset  . Hypertension Mother   . Heart disease Mother   . CAD Father   . Heart attack Father     Social History   Social History  . Marital status: Single    Spouse name: N/A  . Number of children: N/A  . Years of education: N/A   Occupational History  . Not on file.   Social History Main Topics  . Smoking status: Former Smoker    Types: Cigarettes    Quit date: 02/11/2003  . Smokeless tobacco: Never Used  . Alcohol use 4.2 oz/week    7 Cans of beer per week  . Drug use: No  . Sexual activity: No   Other Topics Concern  . Not on file   Social History Narrative  . No narrative on file     Current Outpatient Prescriptions:  .  ALPRAZolam (XANAX) 0.5 MG tablet, Take 1 tablet (0.5 mg total) by mouth 2 (two) times daily as needed for anxiety.,  Disp: 60 tablet, Rfl: 2 .  lisinopril (PRINIVIL,ZESTRIL) 20 MG tablet, TAKE 1 TABLET BY MOUTH EVERY DAY, Disp: 90 tablet, Rfl: 0 .  rosuvastatin (CRESTOR) 40 MG tablet, Take 1 tablet (40 mg total) by mouth daily., Disp: 90 tablet, Rfl: 0  No Known Allergies   Review of Systems  Constitutional: Positive for irritability.  HENT: Positive for congestion, sinus pressure and sneezing. Negative for ear pain and sore throat.   Respiratory: Positive for cough. Negative for shortness of breath.   Cardiovascular: Positive for chest pain (occasional left sided chest pain, intermittent, no radiation, was seen  last year by Cardiology and no further work up was needed. ) and palpitations.  Musculoskeletal: Negative for myalgias.  Psychiatric/Behavioral: The patient is nervous/anxious.      Objective  Vitals:   09/06/16 0830  BP: 138/73  Pulse: 70  Resp: 16  Temp: (!) 97.5 F (36.4 C)  TempSrc: Oral  SpO2: 94%  Weight: 176 lb 12.8 oz (80.2 kg)  Height: 6\' 1"  (1.854 m)    Physical Exam  Constitutional: He is oriented to person, place, and time and well-developed, well-nourished, and in no distress.  HENT:  Head: Normocephalic and atraumatic.  Nose: Right sinus exhibits maxillary sinus tenderness. Right sinus exhibits no frontal sinus tenderness. Left sinus exhibits maxillary sinus tenderness. Left sinus exhibits no frontal sinus tenderness.  Mouth/Throat: Oropharynx is clear and moist. No posterior oropharyngeal erythema.  Cardiovascular: Normal rate, regular rhythm and normal heart sounds.   No murmur heard. Pulmonary/Chest: Effort normal and breath sounds normal. He has no wheezes.  Abdominal: Soft. Bowel sounds are normal. There is no tenderness.  Musculoskeletal: He exhibits no edema.  Neurological: He is alert and oriented to person, place, and time.  Psychiatric: Mood, memory, affect and judgment normal.  Nursing note and vitals reviewed.     Assessment & Plan  1. Anxiety Table responsive to alprazolam taken twice a day when necessary, patient compliant with controlled substances agreement, refills provided - ALPRAZolam (XANAX) 0.5 MG tablet; Take 1 tablet (0.5 mg total) by mouth 2 (two) times daily as needed for anxiety.  Dispense: 60 tablet; Refill: 2  2. Essential hypertension BP stable on present antihypertensive treatment - lisinopril (PRINIVIL,ZESTRIL) 20 MG tablet; Take 1 tablet (20 mg total) by mouth daily.  Dispense: 90 tablet; Refill: 0  3. Hyperlipidemia, unspecified hyperlipidemia type  - rosuvastatin (CRESTOR) 40 MG tablet; Take 1 tablet (40 mg total) by  mouth daily.  Dispense: 90 tablet; Refill: 0  4. Acute non-recurrent maxillary sinusitis By history and exam, start on antibiotics for treatment advised to use saline rinses - azithromycin (ZITHROMAX) 250 MG tablet; 2 tabs po day 1, then 1 tab po q day x 4 days  Dispense: 6 each; Refill: 0   Mysti Haley [facility] 09/06/2016 8:43 AM

## 2016-09-29 DIAGNOSIS — D0439 Carcinoma in situ of skin of other parts of face: Secondary | ICD-10-CM | POA: Diagnosis not present

## 2016-09-29 DIAGNOSIS — L905 Scar conditions and fibrosis of skin: Secondary | ICD-10-CM | POA: Diagnosis not present

## 2016-10-15 DIAGNOSIS — Z23 Encounter for immunization: Secondary | ICD-10-CM | POA: Diagnosis not present

## 2016-11-10 DIAGNOSIS — L570 Actinic keratosis: Secondary | ICD-10-CM | POA: Diagnosis not present

## 2016-11-10 DIAGNOSIS — D0321 Melanoma in situ of right ear and external auricular canal: Secondary | ICD-10-CM | POA: Diagnosis not present

## 2016-11-10 DIAGNOSIS — L821 Other seborrheic keratosis: Secondary | ICD-10-CM | POA: Diagnosis not present

## 2016-11-10 DIAGNOSIS — X32XXXA Exposure to sunlight, initial encounter: Secondary | ICD-10-CM | POA: Diagnosis not present

## 2016-11-14 ENCOUNTER — Other Ambulatory Visit: Payer: Self-pay | Admitting: Family Medicine

## 2016-11-14 DIAGNOSIS — I10 Essential (primary) hypertension: Secondary | ICD-10-CM

## 2016-11-14 NOTE — Telephone Encounter (Signed)
Copied from [place] [id number]. Topic: Quick Communication - See Telephone Encounter >> Nov 14, 2016  2:03 PM Vernona Rieger wrote: CRM for notification. See Telephone encounter for:   Needs refill on his lisinopril 11/14/16.

## 2016-11-30 DIAGNOSIS — D0321 Melanoma in situ of right ear and external auricular canal: Secondary | ICD-10-CM | POA: Diagnosis not present

## 2016-11-30 DIAGNOSIS — L905 Scar conditions and fibrosis of skin: Secondary | ICD-10-CM | POA: Diagnosis not present

## 2016-12-01 HISTORY — PX: SKIN CANCER EXCISION: SHX779

## 2016-12-02 ENCOUNTER — Other Ambulatory Visit: Payer: Self-pay | Admitting: Family Medicine

## 2016-12-02 DIAGNOSIS — F419 Anxiety disorder, unspecified: Secondary | ICD-10-CM

## 2016-12-07 ENCOUNTER — Encounter: Payer: Self-pay | Admitting: Family Medicine

## 2016-12-07 ENCOUNTER — Ambulatory Visit (INDEPENDENT_AMBULATORY_CARE_PROVIDER_SITE_OTHER): Payer: Medicare Other | Admitting: Family Medicine

## 2016-12-07 DIAGNOSIS — E785 Hyperlipidemia, unspecified: Secondary | ICD-10-CM

## 2016-12-07 DIAGNOSIS — I10 Essential (primary) hypertension: Secondary | ICD-10-CM | POA: Diagnosis not present

## 2016-12-07 DIAGNOSIS — F419 Anxiety disorder, unspecified: Secondary | ICD-10-CM

## 2016-12-07 LAB — LIPID PANEL
CHOLESTEROL: 193 mg/dL (ref <200)
HDL: 72 mg/dL (ref >40)
LDL Cholesterol (Calc): 105 mg/dL (calc) — ABNORMAL HIGH
Non-HDL Cholesterol (Calc): 121 mg/dL (calc) (ref <130)
Total CHOL/HDL Ratio: 2.7 (calc) (ref <5.0)
Triglycerides: 74 mg/dL (ref <150)

## 2016-12-07 MED ORDER — LISINOPRIL 20 MG PO TABS: 20.0000 mg | ORAL_TABLET | Freq: Every day | ORAL | 0 refills | Status: DC

## 2016-12-07 MED ORDER — ALPRAZOLAM 0.5 MG PO TABS: 0.5000 mg | ORAL_TABLET | Freq: Two times a day (BID) | ORAL | 2 refills | Status: DC | PRN

## 2016-12-07 MED ORDER — ROSUVASTATIN CALCIUM 40 MG PO TABS: 40.0000 mg | ORAL_TABLET | Freq: Every day | ORAL | 0 refills | Status: DC

## 2016-12-07 NOTE — Progress Notes (Signed)
Name: [name] Browning   MRN: [id number]    DOB: [name] Browning   Date:12/07/2016       Progress Note  Subjective  Chief Complaint  Chief Complaint  Patient presents with  . Hypertension    f/u  . Anxiety    f/u  . Hyperlipidemia    f/u  . Medication Refill    Hypertension  [name]. The problem is unchanged. The problem is controlled. Associated symptoms include anxiety. Pertinent negatives include no chest pain, headaches, orthopnea, palpitations, shortness of breath or sweats. Past treatments include ACE inhibitors. There is no history of kidney disease, CAD/MI or CVA.  Anxiety  Presents for follow-up visit. Patient reports no chest pain, depressed mood, excessive worry, insomnia, nervous/anxious behavior, palpitations or shortness of breath. The severity of symptoms is moderate and causing significant distress. The quality of sleep is good.    Hyperlipidemia  [name]. The problem is controlled. Recent lipid tests were reviewed and are normal. Pertinent negatives include no chest pain, leg pain, myalgias or shortness of breath. Current antihyperlipidemic treatment includes statins.     Past Medical History:  Diagnosis Date  . Carpal tunnel syndrome    Both wrists.  Marland Kitchen COPD (chronic obstructive pulmonary disease) ([place])   . HLD (hyperlipidemia)   . Hypertension   . Kidney stones   . Leaky heart valve   . Normal coronary arteries    a. by cardiac cath in 2004; b. normal stress test in 2007 and 2011; c. Lexiscan 05/20/2014: no significant ischemia, EF 55-65%, no EKG changes, low risk study   . Stroke [name])    TIA in 2016  . Syncope     Past Surgical History:  Procedure Laterality Date  . CARDIAC CATHETERIZATION    . CARPAL TUNNEL RELEASE    . SKIN CANCER EXCISION  12/01/2016   right ear   . SKIN CANCER EXCISION     remove from the right side of the face   . THYROIDECTOMY  1950   Not sure if total or partial thyroidectomy.    Family  History  Problem Relation Age of Onset  . Hypertension Mother   . Heart disease Mother   . CAD Father   . Heart attack Father     Social History   Socioeconomic History  . Marital status: Single    Spouse name: Not on file  . Number of children: Not on file  . Years of education: Not on file  . Highest education level: Not on file  Social Needs  . Financial resource strain: Not on file  . Food insecurity - worry: Not on file  . Food insecurity - inability: Not on file  . Transportation needs - medical: Not on file  . Transportation needs - non-medical: Not on file  Occupational History  . Not on file  Tobacco Use  . Smoking status: Former Smoker    Types: Cigarettes    Last attempt to quit: 02/11/2003    Years since quitting: 13.8  . Smokeless tobacco: Never Used  Substance and Sexual Activity  . Alcohol use: Yes    Alcohol/week: 4.2 oz    Types: 7 Cans of beer per week  . Drug use: No  . Sexual activity: No  Other Topics Concern  . Not on file  Social History Narrative  . Not on file     Current Outpatient Medications:  .  ALPRAZolam (XANAX) 0.5 MG tablet, Take 1  tablet (0.5 mg total) by mouth 2 (two) times daily as needed for anxiety., Disp: 60 tablet, Rfl: 2 .  aspirin EC 81 MG tablet, Take 81 mg by mouth daily., Disp: , Rfl:  .  azithromycin (ZITHROMAX) 250 MG tablet, 2 tabs po day 1, then 1 tab po q day x 4 days, Disp: 6 each, Rfl: 0 .  lisinopril (PRINIVIL,ZESTRIL) 20 MG tablet, Take 1 tablet (20 mg total) by mouth daily., Disp: 90 tablet, Rfl: 0 .  rosuvastatin (CRESTOR) 40 MG tablet, Take 1 tablet (40 mg total) by mouth daily., Disp: 90 tablet, Rfl: 0  No Known Allergies   Review of Systems  Respiratory: Negative for shortness of breath.   Cardiovascular: Negative for chest pain, palpitations and orthopnea.  Musculoskeletal: Negative for myalgias.  Neurological: Negative for headaches.  Psychiatric/Behavioral: The patient is not nervous/anxious and does  not have insomnia.       Objective  Vitals:   12/07/16 0921  BP: 138/76  Pulse: 68  Resp: 16  Temp: (!) 97.5 F (36.4 C)  TempSrc: Oral  SpO2: 95%  Weight: 171 lb 12.8 oz (77.9 kg)  Height: 6\' 1"  (1.854 m)    Physical Exam  Constitutional: He is oriented to person, place, and time and well-developed, well-nourished, and in no distress.  HENT:  Head: Normocephalic and atraumatic.  Cardiovascular: Normal rate and regular rhythm.  Pulmonary/Chest: Effort normal and breath sounds normal.  Abdominal: Soft. Bowel sounds are normal.  Musculoskeletal: He exhibits no edema.  Neurological: He is alert and oriented to person, place, and time.  Psychiatric: Mood, memory, affect and judgment normal.  Nursing note and vitals reviewed.   Assessment & Plan  1. Anxiety Symptoms of anxiety are stable on alprazolam taken twice a day when necessary, advised of my departure from the practice and that he will likely be referred to psychiatry for further management of anxiety and prescription for alprazolam. - ALPRAZolam (XANAX) 0.5 MG tablet; Take 1 tablet (0.5 mg total) by mouth 2 (two) times daily as needed for anxiety.  Dispense: 60 tablet; Refill: 2  2. Essential hypertension  - lisinopril (PRINIVIL,ZESTRIL) 20 MG tablet; Take 1 tablet (20 mg total) by mouth daily.  Dispense: 90 tablet; Refill: 0  3. Hyperlipidemia, unspecified hyperlipidemia type  - rosuvastatin (CRESTOR) 40 MG tablet; Take 1 tablet (40 mg total) by mouth daily.  Dispense: 90 tablet; Refill: 0 - Lipid panel  [name] 12/07/2016 9:37 AM

## 2017-02-09 ENCOUNTER — Other Ambulatory Visit: Payer: Self-pay | Admitting: Family Medicine

## 2017-02-09 DIAGNOSIS — I10 Essential (primary) hypertension: Secondary | ICD-10-CM

## 2017-02-13 ENCOUNTER — Other Ambulatory Visit: Payer: Self-pay | Admitting: Family Medicine

## 2017-02-13 DIAGNOSIS — I10 Essential (primary) hypertension: Secondary | ICD-10-CM

## 2017-03-06 ENCOUNTER — Other Ambulatory Visit: Payer: Self-pay

## 2017-03-06 DIAGNOSIS — F419 Anxiety disorder, unspecified: Secondary | ICD-10-CM

## 2017-03-06 NOTE — Telephone Encounter (Signed)
Refill request for general medication: Alprazolam 0.5 mg  Last office visit: 12/07/2016  Last physical exam: 02/29/2016  Follow-up on file. 03/09/2017

## 2017-03-07 ENCOUNTER — Other Ambulatory Visit: Payer: Self-pay

## 2017-03-07 DIAGNOSIS — I10 Essential (primary) hypertension: Secondary | ICD-10-CM

## 2017-03-07 MED ORDER — LISINOPRIL 20 MG PO TABS: 20.0000 mg | ORAL_TABLET | Freq: Every day | ORAL | 0 refills | Status: DC

## 2017-03-07 NOTE — Telephone Encounter (Signed)
Copied from [place] [id number]. Topic: Inquiry >> Mar 07, 2017  2:32 PM Pricilla Handler wrote: Reason for CRM: Patient's daughter called requesting a refill of lisinopril (PRINIVIL,ZESTRIL) 20 MG tablet for the patient. Patient's Preferred pharmacy is [id number] - [place], [place] - [address] [phone] (Phone) [phone] (Fax).       Thank You!!!

## 2017-03-09 ENCOUNTER — Encounter: Payer: Self-pay | Admitting: Family Medicine

## 2017-03-09 ENCOUNTER — Ambulatory Visit (INDEPENDENT_AMBULATORY_CARE_PROVIDER_SITE_OTHER): Payer: Medicare Other | Admitting: Family Medicine

## 2017-03-09 VITALS — BP 132/64 | HR 76 | Temp 97.7°F | Resp 16 | Ht 73.0 in | Wt 172.5 lb

## 2017-03-09 DIAGNOSIS — J3089 Other allergic rhinitis: Secondary | ICD-10-CM | POA: Insufficient documentation

## 2017-03-09 DIAGNOSIS — R079 Chest pain, unspecified: Secondary | ICD-10-CM | POA: Diagnosis not present

## 2017-03-09 DIAGNOSIS — L719 Rosacea, unspecified: Secondary | ICD-10-CM | POA: Diagnosis not present

## 2017-03-09 DIAGNOSIS — M5030 Other cervical disc degeneration, unspecified cervical region: Secondary | ICD-10-CM | POA: Insufficient documentation

## 2017-03-09 DIAGNOSIS — E785 Hyperlipidemia, unspecified: Secondary | ICD-10-CM | POA: Diagnosis not present

## 2017-03-09 DIAGNOSIS — Z23 Encounter for immunization: Secondary | ICD-10-CM

## 2017-03-09 DIAGNOSIS — I10 Essential (primary) hypertension: Secondary | ICD-10-CM

## 2017-03-09 DIAGNOSIS — M542 Cervicalgia: Secondary | ICD-10-CM

## 2017-03-09 DIAGNOSIS — G8929 Other chronic pain: Secondary | ICD-10-CM

## 2017-03-09 DIAGNOSIS — F419 Anxiety disorder, unspecified: Secondary | ICD-10-CM

## 2017-03-09 MED ORDER — ROSUVASTATIN CALCIUM 40 MG PO TABS: 40.0000 mg | ORAL_TABLET | Freq: Every day | ORAL | 1 refills | Status: DC

## 2017-03-09 MED ORDER — ALPRAZOLAM 0.5 MG PO TABS: 0.2500 mg | ORAL_TABLET | Freq: Two times a day (BID) | ORAL | 0 refills | Status: DC | PRN

## 2017-03-09 MED ORDER — FLUTICASONE PROPIONATE 50 MCG/ACT NA SUSP: 2.0000 | Freq: Every day | NASAL | 2 refills | Status: DC

## 2017-03-09 MED ORDER — LISINOPRIL 20 MG PO TABS: 20.0000 mg | ORAL_TABLET | Freq: Every day | ORAL | 1 refills | Status: DC

## 2017-03-09 MED ORDER — LORATADINE 10 MG PO TABS: 10.0000 mg | ORAL_TABLET | Freq: Every day | ORAL | 2 refills | Status: DC

## 2017-03-09 MED ORDER — CITALOPRAM HYDROBROMIDE 20 MG PO TABS: 20.0000 mg | ORAL_TABLET | Freq: Every day | ORAL | 3 refills | Status: DC

## 2017-03-09 NOTE — Progress Notes (Signed)
Name: [name] Browning   MRN: [id number]    DOB: 12-Dec-1940   Date:03/09/2017       Progress Note  Subjective  Chief Complaint  Chief Complaint  Patient presents with  . Medication Refill    3 month F/U  . Hyperlipidemia  . Hypertension    Dizzy occasionally if he gets up too fast, chest pains off and on  . URI    Onset-1 week, sinus pressure, bilateral eyes itching, headaches.    HPI  HTN: taking medication, he has intermittent chest pain described as sharp, lasts a few seconds and usually with head movement but sometimes at night. It happens a couple of times a week. Not associated with nausea or vomiting or diaphoresis. EKG today was normal . He gets dizzy occasionally when he gets up quickly  AR: he has a long history of allergies, states year round, but worse over the past week, nasal congestion, rhinorrhea, and post-nasal drainage  Hyperlipidemia: taking medication, reviewed last labs. No myalgias  Chronic neck pain: going on for many years, he does some home exercises, not on medication, worse when looking to the left side, sometimes pain radiates to chest , occasionally radiates to left elbow, no weakness.   Anxiety: he states he has been taking alprazolam for many years, he tried some medications in the past without help but not sure of the name. Mind is busy at night and has difficulty sleeping. Explained alprazolam not indicated for daily use and we will wean him off, we will try starting celexa   Patient Active Problem List   Diagnosis Date Noted  . Chronic neck pain 03/09/2017  . Degenerative disc disease, cervical 03/09/2017  . Perennial allergic rhinitis 03/09/2017  . Hyperglycemia 09/01/2015  . Elevated rheumatoid factor 04/08/2015  . Arthritis of both hands 04/01/2015  . Bilateral hand pain 03/18/2015  . Elevated alkaline phosphatase level 03/02/2015  . Anxiety 02/16/2015  . Intermittent chest pain 02/16/2015  . History of TIA (transient ischemic attack)  05/18/2014  . HTN (hypertension) 05/18/2014  . Hyperlipidemia 05/18/2014  . Status post carpal tunnel release 06/26/2013    Past Surgical History:  Procedure Laterality Date  . CARDIAC CATHETERIZATION    . CARPAL TUNNEL RELEASE    . SKIN CANCER EXCISION  12/01/2016   right ear   . SKIN CANCER EXCISION     remove from the right side of the face   . THYROIDECTOMY  1950   Not sure if total or partial thyroidectomy.    Family History  Problem Relation Age of Onset  . Hypertension Mother   . Heart disease Mother   . CAD Father   . Heart attack Father     Social History   Socioeconomic History  . Marital status: Single    Spouse name: Not on file  . Number of children: Not on file  . Years of education: Not on file  . Highest education level: Not on file  Social Needs  . Financial resource strain: Not on file  . Food insecurity - worry: Not on file  . Food insecurity - inability: Not on file  . Transportation needs - medical: Not on file  . Transportation needs - non-medical: Not on file  Occupational History  . Not on file  Tobacco Use  . Smoking status: Former Smoker    Packs/day: 1.00    Years: 46.00    Pack years: 46.00    Types: Cigarettes    Start date: 01/10/1957  Last attempt to quit: 02/11/2003    Years since quitting: 14.0  . Smokeless tobacco: Former Systems developer    Types: Snuff    Quit date: 02/11/2003  Substance and Sexual Activity  . Alcohol use: Yes    Alcohol/week: 4.2 oz    Types: 7 Cans of beer per week  . Drug use: No  . Sexual activity: Yes    Partners: Female  Other Topics Concern  . Not on file  Social History Narrative  . Not on file     Current Outpatient Medications:  .  ALPRAZolam (XANAX) 0.5 MG tablet, Take 1 tablet (0.5 mg total) by mouth 2 (two) times daily as needed for anxiety., Disp: 60 tablet, Rfl: 2 .  aspirin EC 81 MG tablet, Take 81 mg by mouth daily., Disp: , Rfl:  .  lisinopril (PRINIVIL,ZESTRIL) 20 MG tablet, Take 1 tablet  (20 mg total) by mouth daily., Disp: 2 tablet, Rfl: 0 .  metroNIDAZOLE (METROGEL) 0.75 % gel, APPLY TO AFFECTED FACE TWICE DAILY, Disp: , Rfl: 4 .  rosuvastatin (CRESTOR) 40 MG tablet, Take 1 tablet (40 mg total) by mouth daily., Disp: 90 tablet, Rfl: 0  No Known Allergies   ROS  Constitutional: Negative for fever or weight change.  Respiratory: Negative for cough and shortness of breath.   Cardiovascular: Positive  for chest pain and occasional  palpitations.  Gastrointestinal: Negative for abdominal pain, no bowel changes.  Musculoskeletal: Negative for gait problem or joint swelling.  Skin: Negative for rash.  Neurological: Positive  For intermittent  dizziness but no  headache.  No other specific complaints in a complete review of systems (except as listed in HPI above).  Objective  Vitals:   03/09/17 0929  BP: 132/64  Pulse: 76  Resp: 16  Temp: 97.7 F (36.5 C)  TempSrc: Oral  SpO2: 95%  Weight: 172 lb 8 oz (78.2 kg)  Height: 6\' 1"  (1.854 m)    Body mass index is 22.76 kg/m.  Physical Exam  Constitutional: Patient appears well-developed and well-nourished. No distress.  HEENT: head atraumatic, normocephalic, pupils equal and reactive to light, boggy turbinates and clear rhinorrhea, neck supple,- but has decrease rom and pain with left rotation,  throat within normal limits Cardiovascular: Normal rate, regular rhythm and normal heart sounds.  No murmur heard. No BLE edema. Pulmonary/Chest: Effort normal and breath sounds normal. No respiratory distress. Abdominal: Soft.  There is no tenderness. Psychiatric: Patient has a normal mood and affect. behavior is normal. Judgment and thought content normal.  PHQ2/9: Depression screen [facility] 2/9 03/09/2017 12/07/2016 09/06/2016 05/27/2016 02/29/2016  Decreased Interest 0 0 0 0 0  Down, Depressed, Hopeless 0 0 0 0 0  PHQ - 2 Score 0 0 0 0 0  Altered sleeping 3 - - - -  Tired, decreased energy 2 - - - -  Change in appetite 0 -  - - -  Feeling bad or failure about yourself  0 - - - -  Trouble concentrating 2 - - - -  Moving slowly or fidgety/restless 1 - - - -  Suicidal thoughts 0 - - - -  PHQ-9 Score 8 - - - -  Difficult doing work/chores Not difficult at all - - - -   GAD 7 : Generalized Anxiety Score 03/09/2017  Nervous, Anxious, on Edge 2  Control/stop worrying 0  Worry too much - different things 0  Trouble relaxing 3  Restless 1  Easily annoyed or irritable 1  Afraid -  awful might happen 2  Total GAD 7 Score 9  Anxiety Difficulty Not difficult at all     Fall Risk: Fall Risk  03/09/2017 12/07/2016 09/06/2016 05/27/2016 02/29/2016  Falls in the past year? No No No No No      Functional Status Survey: Is the patient deaf or have difficulty hearing?: No Does the patient have difficulty seeing, even when wearing glasses/contacts?: Yes Does the patient have difficulty concentrating, remembering, or making decisions?: Yes Does the patient have difficulty walking or climbing stairs?: No Does the patient have difficulty dressing or bathing?: No Does the patient have difficulty doing errands alone such as visiting a doctor's office or shopping?: No    Assessment & Plan  1. Essential hypertension  - EKG 12-Lead - lisinopril (PRINIVIL,ZESTRIL) 20 MG tablet; Take 1 tablet (20 mg total) by mouth daily.  Dispense: 90 tablet; Refill: 1  2. Chest pain, unspecified type  - EKG 12-Lead - normal, pain seems to be triggered by neck movement   3. Anxiety  We will give him 30 days to wean self off, he was given letter by Dr. Manuella Ghazi back in Nov advising to see psychiatrist but patient refuses, willing to try Celexa  - ALPRAZolam (XANAX) 0.5 MG tablet; Take 0.5 tablets (0.25 mg total) by mouth 2 (two) times daily as needed for anxiety. Wean self off, try half twice daily for one week , after that half once a day for one week, after that half every other day and stop  Dispense: 30 tablet; Refill: 0 - citalopram  (CELEXA) 20 MG tablet; Take 1 tablet (20 mg total) by mouth daily.  Dispense: 30 tablet; Refill: 3  4. Hyperlipidemia, unspecified hyperlipidemia type  - rosuvastatin (CRESTOR) 40 MG tablet; Take 1 tablet (40 mg total) by mouth daily.  Dispense: 90 tablet; Refill: 1  5. Perennial allergic rhinitis  - fluticasone (FLONASE) 50 MCG/ACT nasal spray; Place 2 sprays into both nostrils daily.  Dispense: 16 g; Refill: 2 - loratadine (CLARITIN) 10 MG tablet; Take 1 tablet (10 mg total) by mouth daily.  Dispense: 30 tablet; Refill: 2  6. Rosacea  Using metrogel prn   7. Degenerative disc disease, cervical  Intermittent symptoms, advised PT ( but he wants to hold off ) and tylenol prn  8. Chronic neck pain  Found as a diagnosed on [facility] Records  9. Need for pneumococcal vaccination  - Pneumococcal polysaccharide vaccine 23-valent greater than or equal to 2yo subcutaneous/IM

## 2017-03-10 ENCOUNTER — Telehealth: Payer: Self-pay

## 2017-03-10 NOTE — Telephone Encounter (Signed)
Called pt to resched AWV w/ [facility] from 03/17/17 to 03/14/17. Unable to reach d/t no answer.

## 2017-03-17 ENCOUNTER — Telehealth: Payer: Self-pay

## 2017-03-17 DIAGNOSIS — Z85828 Personal history of other malignant neoplasm of skin: Secondary | ICD-10-CM | POA: Diagnosis not present

## 2017-03-17 DIAGNOSIS — L821 Other seborrheic keratosis: Secondary | ICD-10-CM | POA: Diagnosis not present

## 2017-03-17 DIAGNOSIS — Z85820 Personal history of malignant melanoma of skin: Secondary | ICD-10-CM | POA: Diagnosis not present

## 2017-03-17 DIAGNOSIS — L718 Other rosacea: Secondary | ICD-10-CM | POA: Diagnosis not present

## 2017-03-17 NOTE — Telephone Encounter (Signed)
Called pt re: NS on 03/17/17 and to resched AWV w/ [facility]. Unable to reach d/t no answer.

## 2017-03-31 ENCOUNTER — Other Ambulatory Visit: Payer: Self-pay

## 2017-03-31 DIAGNOSIS — F419 Anxiety disorder, unspecified: Secondary | ICD-10-CM

## 2017-03-31 MED ORDER — CITALOPRAM HYDROBROMIDE 20 MG PO TABS: 20.0000 mg | ORAL_TABLET | Freq: Every day | ORAL | 0 refills | Status: DC

## 2017-03-31 NOTE — Telephone Encounter (Signed)
[facility] day [facility] 30 day. Thanks

## 2017-05-01 ENCOUNTER — Ambulatory Visit: Payer: Medicare Other | Admitting: Family Medicine

## 2017-05-30 ENCOUNTER — Other Ambulatory Visit: Payer: Self-pay | Admitting: Family Medicine

## 2017-05-30 DIAGNOSIS — J3089 Other allergic rhinitis: Secondary | ICD-10-CM

## 2017-05-30 NOTE — Telephone Encounter (Signed)
Refill request for general medication: Flonase 50 mcg/act  Last office visit: 02/28/219  Last physical exam: 02/29/2016  Follow-ups on file. None indicated

## 2017-06-21 ENCOUNTER — Other Ambulatory Visit: Payer: Self-pay | Admitting: Family Medicine

## 2017-06-21 DIAGNOSIS — J3089 Other allergic rhinitis: Secondary | ICD-10-CM

## 2017-06-28 ENCOUNTER — Other Ambulatory Visit: Payer: Self-pay

## 2017-06-28 ENCOUNTER — Encounter: Payer: Self-pay | Admitting: Emergency Medicine

## 2017-06-28 ENCOUNTER — Emergency Department: Payer: Medicare Other

## 2017-06-28 DIAGNOSIS — Y999 Unspecified external cause status: Secondary | ICD-10-CM | POA: Diagnosis not present

## 2017-06-28 DIAGNOSIS — I10 Essential (primary) hypertension: Secondary | ICD-10-CM | POA: Insufficient documentation

## 2017-06-28 DIAGNOSIS — Z86718 Personal history of other venous thrombosis and embolism: Secondary | ICD-10-CM | POA: Insufficient documentation

## 2017-06-28 DIAGNOSIS — M66821 Spontaneous rupture of other tendons, right upper arm: Secondary | ICD-10-CM | POA: Diagnosis not present

## 2017-06-28 DIAGNOSIS — M79621 Pain in right upper arm: Secondary | ICD-10-CM | POA: Diagnosis not present

## 2017-06-28 DIAGNOSIS — R2231 Localized swelling, mass and lump, right upper limb: Secondary | ICD-10-CM | POA: Diagnosis present

## 2017-06-28 DIAGNOSIS — S46211A Strain of muscle, fascia and tendon of other parts of biceps, right arm, initial encounter: Secondary | ICD-10-CM | POA: Diagnosis not present

## 2017-06-28 DIAGNOSIS — Z7982 Long term (current) use of aspirin: Secondary | ICD-10-CM | POA: Insufficient documentation

## 2017-06-28 DIAGNOSIS — J449 Chronic obstructive pulmonary disease, unspecified: Secondary | ICD-10-CM | POA: Insufficient documentation

## 2017-06-28 DIAGNOSIS — X58XXXA Exposure to other specified factors, initial encounter: Secondary | ICD-10-CM | POA: Insufficient documentation

## 2017-06-28 DIAGNOSIS — Y929 Unspecified place or not applicable: Secondary | ICD-10-CM | POA: Diagnosis not present

## 2017-06-28 DIAGNOSIS — Z79899 Other long term (current) drug therapy: Secondary | ICD-10-CM | POA: Insufficient documentation

## 2017-06-28 DIAGNOSIS — M7989 Other specified soft tissue disorders: Secondary | ICD-10-CM | POA: Diagnosis not present

## 2017-06-28 DIAGNOSIS — R600 Localized edema: Secondary | ICD-10-CM | POA: Diagnosis not present

## 2017-06-28 DIAGNOSIS — Y939 Activity, unspecified: Secondary | ICD-10-CM | POA: Insufficient documentation

## 2017-06-28 DIAGNOSIS — Z87891 Personal history of nicotine dependence: Secondary | ICD-10-CM | POA: Diagnosis not present

## 2017-06-28 NOTE — ED Triage Notes (Addendum)
Patient ambulatory to triage with steady gait, without difficulty or distress noted; pt reports swelling/pain to right upper arm x 2 days; denies any known injury; pt with hx DVT and st not taking any anticoag at this time

## 2017-06-29 ENCOUNTER — Emergency Department: Payer: Medicare Other

## 2017-06-29 ENCOUNTER — Encounter: Payer: Self-pay | Admitting: Emergency Medicine

## 2017-06-29 ENCOUNTER — Emergency Department
Admission: EM | Admit: 2017-06-29 | Discharge: 2017-06-29 | Disposition: A | Discharge status: Home / Self Care | Payer: Medicare Other | Attending: Emergency Medicine | Admitting: Emergency Medicine

## 2017-06-29 DIAGNOSIS — S46211A Strain of muscle, fascia and tendon of other parts of biceps, right arm, initial encounter: Secondary | ICD-10-CM | POA: Diagnosis not present

## 2017-06-29 DIAGNOSIS — M7989 Other specified soft tissue disorders: Secondary | ICD-10-CM | POA: Diagnosis not present

## 2017-06-29 DIAGNOSIS — R609 Edema, unspecified: Secondary | ICD-10-CM

## 2017-06-29 DIAGNOSIS — R600 Localized edema: Secondary | ICD-10-CM | POA: Diagnosis not present

## 2017-06-29 DIAGNOSIS — M79621 Pain in right upper arm: Secondary | ICD-10-CM | POA: Diagnosis not present

## 2017-06-29 HISTORY — DX: Acute embolism and thrombosis of unspecified deep veins of unspecified lower extremity: I82.409

## 2017-06-29 NOTE — ED Notes (Signed)
Pt ambulatory upon discharge; declined wheel chair. Verbalized understanding of discharge instructions, pain management and follow-up care. VSS. Skin warm and dry. A&O x4.

## 2017-06-29 NOTE — ED Notes (Signed)
Patient remains in MRI at this time.

## 2017-06-29 NOTE — ED Provider Notes (Signed)
[facility] Emergency Department Provider Note  ____________________________________________   First MD Initiated Contact with Patient 06/29/17 0002     (approximate)  I have reviewed the triage vital signs and the nursing notes.   HISTORY  Chief Complaint Arm Swelling    HPI [name] Browning is a 77 y.o. male with medical history as listed below which  also includes a blood clot in his leg for which she previously was on anticoagulation but no longer takes anything.  He presents by private vehicle for evaluation of gradually worsening swelling in his right upper arm over the last 2 days.  He remembers no trauma or injury that he sustained.  He has not had any unusual activities that could have led to an injury of which she is aware.  He denies fever/chills, chest pain, shortness of breath, nausea, vomiting, and abdominal pain.  He states that the swelling is moderate and the arm is painful but not week and he is able to use it as normal.  It is not cold in the hand and he has normal elbow, wrist, and hand function.  He has no other symptoms.  Nothing particular makes it better and using the arm makes it hurt worse.  Past Medical History:  Diagnosis Date  . Carpal tunnel syndrome    Both wrists.  Marland Kitchen COPD (chronic obstructive pulmonary disease) ([place])   . DVT (deep venous thrombosis) ([place])    taken off anticoagulation after about 6 months  . HLD (hyperlipidemia)   . Hypertension   . Kidney stones   . Leaky heart valve   . Normal coronary arteries    a. by cardiac cath in 2004; b. normal stress test in 2007 and 2011; c. Lexiscan 05/20/2014: no significant ischemia, EF 55-65%, no EKG changes, low risk study   . Stroke [facility])    TIA in 2016  . Syncope     Patient Active Problem List   Diagnosis Date Noted  . Chronic neck pain 03/09/2017  . Degenerative disc disease, cervical 03/09/2017  . Perennial allergic rhinitis 03/09/2017  . Hyperglycemia 09/01/2015  .  Elevated rheumatoid factor 04/08/2015  . Arthritis of both hands 04/01/2015  . Bilateral hand pain 03/18/2015  . Elevated alkaline phosphatase level 03/02/2015  . Anxiety 02/16/2015  . Intermittent chest pain 02/16/2015  . History of TIA (transient ischemic attack) 05/18/2014  . HTN (hypertension) 05/18/2014  . Hyperlipidemia 05/18/2014  . Status post carpal tunnel release 06/26/2013    Past Surgical History:  Procedure Laterality Date  . CARDIAC CATHETERIZATION    . CARPAL TUNNEL RELEASE    . SKIN CANCER EXCISION  12/01/2016   right ear   . SKIN CANCER EXCISION     remove from the right side of the face   . THYROIDECTOMY  1950   Not sure if total or partial thyroidectomy.    Prior to Admission medications   Medication Sig Start Date End Date Taking? Authorizing Provider  ALPRAZolam Duanne Moron) 0.5 MG tablet Take 0.5 tablets (0.25 mg total) by mouth 2 (two) times daily as needed for anxiety. Wean self off, try half twice daily for one week , after that half once a day for one week, after that half every other day and stop 03/09/17  Yes Sowles, Drue Stager, MD  aspirin EC 81 MG tablet Take 81 mg by mouth daily.   Yes [provider]  citalopram (CELEXA) 20 MG tablet Take 1 tablet (20 mg total) by mouth daily.  03/31/17  Yes Sowles, Drue Stager, MD  fluticasone (FLONASE) 50 MCG/ACT nasal spray SPRAY 2 SPRAYS INTO EACH NOSTRIL EVERY DAY 05/30/17  Yes Sowles, Drue Stager, MD  lisinopril (PRINIVIL,ZESTRIL) 20 MG tablet Take 1 tablet (20 mg total) by mouth daily. 03/09/17  Yes Sowles, Drue Stager, MD  loratadine (CLARITIN) 10 MG tablet TAKE 1 TABLET BY MOUTH EVERY DAY 06/21/17  Yes Sowles, Drue Stager, MD  metroNIDAZOLE (METROGEL) 0.75 % gel APPLY TO AFFECTED FACE TWICE DAILY 02/08/17  Yes [provider]  rosuvastatin (CRESTOR) 40 MG tablet Take 1 tablet (40 mg total) by mouth daily. 03/09/17  Yes Steele Sizer, MD    Allergies Patient has no known allergies.  Family History  Problem Relation  Age of Onset  . Hypertension Mother   . Heart disease Mother   . CAD Father   . Heart attack Father     Social History Social History   Tobacco Use  . Smoking status: Former Smoker    Packs/day: 1.00    Years: 46.00    Pack years: 46.00    Types: Cigarettes    Start date: 01/10/1957    Last attempt to quit: 02/11/2003    Years since quitting: 14.3  . Smokeless tobacco: Former Systems developer    Types: Snuff    Quit date: 02/11/2003  Substance Use Topics  . Alcohol use: Yes    Alcohol/week: 4.2 oz    Types: 7 Cans of beer per week  . Drug use: No    Review of Systems Constitutional: No fever/chills Eyes: No visual changes. Cardiovascular: Denies chest pain. Respiratory: Denies shortness of breath. Gastrointestinal: No abdominal pain.  No nausea, no vomiting.  No diarrhea.  No constipation. Genitourinary: Negative for dysuria. Musculoskeletal: Atraumatic swelling in the right upper arm as described above Integumentary: Negative for rash. Neurological: Negative for headaches, focal weakness or numbness.   ____________________________________________   PHYSICAL EXAM:  VITAL SIGNS: ED Triage Vitals [06/28/17 2229]  Enc Vitals Group     BP      Pulse      Resp      Temp      Temp src      SpO2      Weight 78 kg (172 lb)     Height 1.88 m (6\' 2" )     Head Circumference      Peak Flow      Pain Score 10     Pain Loc      Pain Edu?      Excl. in [place]?     Constitutional: Alert and oriented. Well appearing and in no acute distress. Eyes: Conjunctivae are normal.  Head: Atraumatic. Nose: No congestion/rhinnorhea. Mouth/Throat: Mucous membranes are moist. Neck: No stridor.  No meningeal signs.   Cardiovascular: Normal rate, regular rhythm. Good peripheral circulation. Grossly normal heart sounds. Respiratory: Normal respiratory effort.  No retractions. Lungs CTAB. Gastrointestinal: Soft and nontender. No distention.  Musculoskeletal: Swelling and tenderness of the right  bicep.  No erythema or discoloration of the skin or surrounding tissue.  The swelling is firm but not hard with easily palpable compartments.  Radial pulses easily palpable and the rest of the extremity appears normal with good capillary refill and neurologically intact. Neurologic:  Normal speech and language. No gross focal neurologic deficits are appreciated.  Skin:  Skin is warm, dry and intact. No rash noted. Psychiatric: Mood and affect are normal. Speech and behavior are normal.  ____________________________________________   LABS (all labs ordered are listed, but only  abnormal results are displayed)  Labs Reviewed - No data to display ____________________________________________  EKG  None - EKG not ordered by ED physician ____________________________________________  RADIOLOGY   ED MD interpretation:  Probable biceps tendon injury  Official radiology report(s): Mr Humerus Right Wo Contrast  Result Date: 06/29/2017 CLINICAL DATA:  Swelling and pain in the right upper arm for 2 days. No injury. History of DVT. EXAM: MRI OF THE RIGHT HUMERUS WITHOUT CONTRAST TECHNIQUE: Multiplanar, multisequence MR imaging of the right humerus was performed. No intravenous contrast was administered. COMPARISON:  None. FINDINGS: Examination is technically limited due to motion artifact. Contrast-enhanced images may be more sensitive for evaluation for osteomyelitis. Plain film correlation is suggested as well. Bones/Joint/Cartilage Degenerative changes in the glenohumeral joints with joint space narrowing and osteophyte formation. Small subcortical cyst likely representing degenerative cysts. Marrow signal intensities are homogeneous and normal. No cortical changes or bone marrow edema to suggest osteomyelitis. Fluid in the subcoracoid bursa. Small elbow effusion. Ligaments Not well demonstrated. Muscles and Tendons No definite evidence of any intramuscular mass lesions. Soft tissues There is edema in  the subcutaneous fat around the anterior muscle compartment and in the biceps tendon sheath. This may indicate inflammatory edema due to cellulitis or from muscle or tendon injury. No loculated fluid to suggest abscess. IMPRESSION: 1. Technically limited examination due to motion artifact. 2. No definite bone changes to suggest osteomyelitis. 3. Edema in the subcutaneous fat around the anterior muscle compartment and in the biceps tendon sheath. This could indicate edema from cellulitis or muscle or tendon injury. No abscess. 4. Degenerative changes in the glenohumeral joints. Electronically Signed   By: Lucienne Capers M.D.   On: 06/29/2017 05:14   US Venous Img Upper Uni Right  Result Date: 06/29/2017 CLINICAL DATA:  Swelling and pain in the right upper arm for 2 days. Bulging the biceps region. EXAM: UPPER EXTREMITY VENOUS DOPPLER ULTRASOUND TECHNIQUE: Gray-scale sonography with graded compression, as well as color Doppler and duplex ultrasound were performed to evaluate the upper extremity deep venous system from the level of the subclavian vein and including the jugular, axillary, basilic, radial, ulnar and upper cephalic vein. Spectral Doppler was utilized to evaluate flow at rest and with distal augmentation maneuvers. COMPARISON:  None. FINDINGS: Contralateral Subclavian Vein: Respiratory phasicity is normal and symmetric with the symptomatic side. No evidence of thrombus. Normal compressibility. Internal Jugular Vein: No evidence of thrombus. Normal compressibility, respiratory phasicity and response to augmentation. Subclavian Vein: No evidence of thrombus. Normal compressibility, respiratory phasicity and response to augmentation. Axillary Vein: No evidence of thrombus. Normal compressibility, respiratory phasicity and response to augmentation. Cephalic Vein: No evidence of thrombus. Normal compressibility, respiratory phasicity and response to augmentation. Basilic Vein: No evidence of thrombus.  Normal compressibility, respiratory phasicity and response to augmentation. Brachial Veins: No evidence of thrombus. Normal compressibility, respiratory phasicity and response to augmentation. Radial Veins: No evidence of thrombus. Normal compressibility, respiratory phasicity and response to augmentation. Ulnar Veins: No evidence of thrombus. Normal compressibility, respiratory phasicity and response to augmentation. Venous Reflux:  None visualized. Other Findings: There is hypoechoic fluid outlining an unspecified muscle bundle extending from right shoulder mid upper arm. As it appears to be in close proximity to the biceps tubercles, the possibility of a proximal biceps tendon tear with retraction is raised. IMPRESSION: 1. No evidence of DVT within the right upper extremity. 2. Tracking fluid outlining an unspecified muscle bundle in the right upper arm raises the possibility of a tendon tear  and retraction possibly involving the proximal biceps given close proximity of fluid near what appears to be the biceps tubercles. MRI of the shoulder may help for further assessment on a nonemergent basis Electronically Signed   By: Ashley Royalty M.D.   On: 06/29/2017 00:50    ____________________________________________   PROCEDURES  Critical Care performed: No   Procedure(s) performed:   Procedures   ____________________________________________   INITIAL IMPRESSION / ASSESSMENT AND PLAN / ED COURSE  As part of my medical decision making, I reviewed the following data within the [place] notes reviewed and incorporated, Discussed with radiologist, Discussed with orthopedics and Notes from prior ED visits    Differential diagnosis includes, but is not limited to, biceps tendon rupture, DVT, infection (cellulitis).  However the patient has had no symptoms of infection.  The only thing that is unusual as this is a slow onset process rather than an acute event where he knew  that something was torn.  He is otherwise well-appearing and active, uses arm regularly, so this is a change.  Ultrasound was ordered from triage since he has a history of DVT even though that is not clinically consistent.  Clinical Course as of Jun 29 733  Thu Jun 29, 2017  0144 Ultrasound is notable for no DVT but some fluid that appears consistent with a biceps tendon tear which does fit clinically.  However the history of present illness does not fit; the patient has had gradually worsening pain and swelling over several days, rather than acute onset consistent with a traumatic injury.  Given the concern for an underlying neoplastic process that needs to be identified urgently rather than over time as an outpatient, I discussed the case with Dr. Randel Pigg, the radiologist who interpreted the ultrasound, to determine the best MRI to order in the emergency department.  We agreed upon an MR humerus without contrast.  This should give [place] all the information we need for tonight and for outpatient follow-up for further treatment.  I updated the patient and his granddaughter who understand and agree with the plan.  He is in no acute distress.  US Venous Img Upper Uni Right [CF]  0625 Edema in the subcutaneous fat around the anterior muscle compartment and in the biceps tendon sheath.  The radiologist reports that this could represent an injury but it could also represent cellulitis which I do not feel is clinically consistent given no other signs or symptoms of infection, but I will call orthopedics to see if they can review the MRI and give their impression as well as management recommendations.  A page has been placed.   [CF]  [phone] Discussed with Dr. Marry Guan by phone who reviewed the images.  He agrees that there is no definitive evidence of infection and he does not recommend starting on empiric antibiotics.  He recommends a sling for comfort and close outpatient follow-up in clinic early next week.  He The  patient's name and medical record number and is also going to discuss it with some of his colleagues to come up with a plan before the patient follows up as an outpatient.  I will discuss this with the patient and family.   [CF]    Clinical Course User Index [CF] Hinda Kehr, MD    ____________________________________________  FINAL CLINICAL IMPRESSION(S) / ED DIAGNOSES  Final diagnoses:  Swelling  Rupture of right biceps tendon, initial encounter     MEDICATIONS GIVEN DURING THIS VISIT:  Medications -  No data to display   ED Discharge Orders    None       Note:  This document was prepared using Dragon voice recognition software and may include unintentional dictation errors.    Hinda Kehr, MD 06/29/17 [phone]

## 2017-06-29 NOTE — Discharge Instructions (Signed)
As we discussed, your MRI and ultrasound both suggest that your biceps tendon has ruptured and is the cause of your pain and swelling.  There is no sign at the moment that you have any sort of infection.  I discussed the case with Dr. Marry Guan, one of the orthopedic surgeons, and he suggested using a sling for comfort and calling his office at the next available opportunity to set up an appointment for early next week in the orthopedics clinic.  He will be able to discuss additional management options with you at that time.  In the meantime we recommend you use Tylenol as needed for pain control (up to 1000 mg by mouth every 6 hours).  You may also consider using ice packs or cold packs on your arm at the area of swelling to help keep the swelling from getting any worse and provide some pain relief.  Try to use the arm as little as possible to give it a rest.  Return to the emergency department if you develop new or worsening symptoms that concern you.

## 2017-07-03 DIAGNOSIS — M7581 Other shoulder lesions, right shoulder: Secondary | ICD-10-CM | POA: Diagnosis not present

## 2017-07-03 DIAGNOSIS — S46211A Strain of muscle, fascia and tendon of other parts of biceps, right arm, initial encounter: Secondary | ICD-10-CM | POA: Diagnosis not present

## 2017-07-04 ENCOUNTER — Other Ambulatory Visit: Payer: Self-pay | Admitting: Family Medicine

## 2017-07-04 DIAGNOSIS — F419 Anxiety disorder, unspecified: Secondary | ICD-10-CM

## 2017-07-04 NOTE — Telephone Encounter (Signed)
[phone]  Called [phone] and lvm informing him that prescription has been sent to pharmacy and he need to schedule an appt

## 2017-07-04 NOTE — Telephone Encounter (Signed)
Sending one month, needs follow up.

## 2017-08-02 DIAGNOSIS — S46211D Strain of muscle, fascia and tendon of other parts of biceps, right arm, subsequent encounter: Secondary | ICD-10-CM | POA: Diagnosis not present

## 2017-08-02 DIAGNOSIS — M7581 Other shoulder lesions, right shoulder: Secondary | ICD-10-CM | POA: Diagnosis not present

## 2017-08-03 ENCOUNTER — Other Ambulatory Visit: Payer: Self-pay | Admitting: Student

## 2017-08-03 DIAGNOSIS — S46211D Strain of muscle, fascia and tendon of other parts of biceps, right arm, subsequent encounter: Secondary | ICD-10-CM

## 2017-08-03 DIAGNOSIS — M7581 Other shoulder lesions, right shoulder: Secondary | ICD-10-CM

## 2017-08-15 ENCOUNTER — Ambulatory Visit
Admission: RE | Admit: 2017-08-15 | Discharge: 2017-08-15 | Disposition: A | Discharge status: Home / Self Care | Payer: Medicare Other | Source: Ambulatory Visit | Attending: Student | Admitting: Student

## 2017-08-15 DIAGNOSIS — M7581 Other shoulder lesions, right shoulder: Secondary | ICD-10-CM | POA: Diagnosis not present

## 2017-08-15 DIAGNOSIS — M19011 Primary osteoarthritis, right shoulder: Secondary | ICD-10-CM | POA: Insufficient documentation

## 2017-08-15 DIAGNOSIS — X58XXXD Exposure to other specified factors, subsequent encounter: Secondary | ICD-10-CM | POA: Diagnosis not present

## 2017-08-15 DIAGNOSIS — S46211D Strain of muscle, fascia and tendon of other parts of biceps, right arm, subsequent encounter: Secondary | ICD-10-CM | POA: Insufficient documentation

## 2017-08-15 DIAGNOSIS — M75121 Complete rotator cuff tear or rupture of right shoulder, not specified as traumatic: Secondary | ICD-10-CM | POA: Insufficient documentation

## 2017-08-15 DIAGNOSIS — M25511 Pain in right shoulder: Secondary | ICD-10-CM | POA: Diagnosis not present

## 2017-08-21 DIAGNOSIS — S46011A Strain of muscle(s) and tendon(s) of the rotator cuff of right shoulder, initial encounter: Secondary | ICD-10-CM | POA: Diagnosis not present

## 2017-08-21 DIAGNOSIS — M75111 Incomplete rotator cuff tear or rupture of right shoulder, not specified as traumatic: Secondary | ICD-10-CM | POA: Diagnosis not present

## 2017-08-21 DIAGNOSIS — S46101A Unspecified injury of muscle, fascia and tendon of long head of biceps, right arm, initial encounter: Secondary | ICD-10-CM | POA: Diagnosis not present

## 2017-08-21 DIAGNOSIS — M7581 Other shoulder lesions, right shoulder: Secondary | ICD-10-CM | POA: Insufficient documentation

## 2017-08-23 ENCOUNTER — Other Ambulatory Visit: Payer: Self-pay

## 2017-08-23 ENCOUNTER — Encounter
Admission: RE | Admit: 2017-08-23 | Discharge: 2017-08-23 | Disposition: A | Discharge status: Home / Self Care | Payer: Medicare Other | Source: Ambulatory Visit | Attending: Surgery | Admitting: Surgery

## 2017-08-23 DIAGNOSIS — Z87891 Personal history of nicotine dependence: Secondary | ICD-10-CM | POA: Diagnosis not present

## 2017-08-23 DIAGNOSIS — Z85828 Personal history of other malignant neoplasm of skin: Secondary | ICD-10-CM | POA: Diagnosis not present

## 2017-08-23 DIAGNOSIS — Z86718 Personal history of other venous thrombosis and embolism: Secondary | ICD-10-CM | POA: Diagnosis not present

## 2017-08-23 DIAGNOSIS — M19011 Primary osteoarthritis, right shoulder: Secondary | ICD-10-CM | POA: Diagnosis not present

## 2017-08-23 DIAGNOSIS — Z8249 Family history of ischemic heart disease and other diseases of the circulatory system: Secondary | ICD-10-CM | POA: Diagnosis not present

## 2017-08-23 DIAGNOSIS — S46811A Strain of other muscles, fascia and tendons at shoulder and upper arm level, right arm, initial encounter: Secondary | ICD-10-CM | POA: Diagnosis not present

## 2017-08-23 DIAGNOSIS — I10 Essential (primary) hypertension: Secondary | ICD-10-CM | POA: Diagnosis not present

## 2017-08-23 DIAGNOSIS — J449 Chronic obstructive pulmonary disease, unspecified: Secondary | ICD-10-CM | POA: Diagnosis not present

## 2017-08-23 DIAGNOSIS — M75111 Incomplete rotator cuff tear or rupture of right shoulder, not specified as traumatic: Secondary | ICD-10-CM | POA: Diagnosis not present

## 2017-08-23 DIAGNOSIS — Z8673 Personal history of transient ischemic attack (TIA), and cerebral infarction without residual deficits: Secondary | ICD-10-CM | POA: Diagnosis not present

## 2017-08-23 DIAGNOSIS — G5602 Carpal tunnel syndrome, left upper limb: Secondary | ICD-10-CM | POA: Diagnosis not present

## 2017-08-23 DIAGNOSIS — X58XXXA Exposure to other specified factors, initial encounter: Secondary | ICD-10-CM | POA: Diagnosis not present

## 2017-08-23 DIAGNOSIS — E785 Hyperlipidemia, unspecified: Secondary | ICD-10-CM | POA: Diagnosis not present

## 2017-08-23 DIAGNOSIS — M1990 Unspecified osteoarthritis, unspecified site: Secondary | ICD-10-CM | POA: Diagnosis not present

## 2017-08-23 DIAGNOSIS — F419 Anxiety disorder, unspecified: Secondary | ICD-10-CM | POA: Diagnosis not present

## 2017-08-23 DIAGNOSIS — Z87442 Personal history of urinary calculi: Secondary | ICD-10-CM | POA: Diagnosis not present

## 2017-08-23 DIAGNOSIS — Z79899 Other long term (current) drug therapy: Secondary | ICD-10-CM | POA: Diagnosis not present

## 2017-08-23 DIAGNOSIS — Z7982 Long term (current) use of aspirin: Secondary | ICD-10-CM | POA: Diagnosis not present

## 2017-08-23 HISTORY — DX: Pneumonia, unspecified organism: J18.9

## 2017-08-23 HISTORY — DX: Anxiety disorder, unspecified: F41.9

## 2017-08-23 HISTORY — DX: Malignant (primary) neoplasm, unspecified: C80.1

## 2017-08-23 LAB — BASIC METABOLIC PANEL
Anion gap: 5 (ref 5–15)
BUN: 19 mg/dL (ref 8–23)
CHLORIDE: 109 mmol/L (ref 98–111)
CO2: 28 mmol/L (ref 22–32)
Calcium: 9.1 mg/dL (ref 8.9–10.3)
Creatinine, Ser: 1 mg/dL (ref 0.61–1.24)
GFR calc non Af Amer: >60 mL/min (ref >60)
Glucose, Bld: 99 mg/dL (ref 70–99)
POTASSIUM: 3.8 mmol/L (ref 3.5–5.1)
SODIUM: 142 mmol/L (ref 135–145)

## 2017-08-23 LAB — CBC
HEMATOCRIT: 38.6 % — AB (ref 40.0–52.0)
Hemoglobin: 13.4 g/dL (ref 13.0–18.0)
MCH: 32.1 pg (ref 26.0–34.0)
MCHC: 34.7 g/dL (ref 32.0–36.0)
MCV: 92.5 fL (ref 80.0–100.0)
Platelets: 167 10*3/uL (ref 150–440)
RBC: 4.17 MIL/uL — AB (ref 4.40–5.90)
RDW: 13.1 % (ref 11.5–14.5)
WBC: 6.4 10*3/uL (ref 3.8–10.6)

## 2017-08-23 MED ORDER — CEFAZOLIN SODIUM-DEXTROSE 2-4 GM/100ML-% IV SOLN
2.0000 g | Freq: Once | INTRAVENOUS | Status: AC
  Administered 2017-08-24: 2 g via INTRAVENOUS

## 2017-08-23 NOTE — Patient Instructions (Signed)
Your procedure is scheduled on: Thursday, August 24, 2017 Report to Day Surgery on the 2nd floor of the [facility] AT 8:00 AM Call [phone] if you have problems getting there on time.  REMEMBER: Instructions that are not followed completely may result in serious medical risk, up to and including death; or upon the discretion of your surgeon and anesthesiologist your surgery may need to be rescheduled.  Do not eat food after midnight the night before surgery.  No gum chewing, lozengers or hard candies.  You may however, drink CLEAR liquids up to 2 hours before you are scheduled to arrive for your surgery. Do not drink anything within 2 hours of the start of your surgery.  Clear liquids include: - water  - apple juice without pulp - gatorade - black coffee or tea (Do NOT add milk or creamers to the coffee or tea) Do NOT drink anything that is not on this list.  No Alcohol for 24 hours before or after surgery.  No Smoking including e-cigarettes for 24 hours prior to surgery.  No chewable tobacco products for at least 6 hours prior to surgery.  No nicotine patches on the day of surgery.  On the morning of surgery brush your teeth with toothpaste and water, you may rinse your mouth with mouthwash if you wish. Do not swallow any toothpaste or mouthwash.  Notify your doctor if there is any change in your medical condition (cold, fever, infection).  Do not wear jewelry, make-up, hairpins, clips or nail polish.  Do not wear lotions, powders, or perfumes. You may wear deodorant.  Do not shave 48 hours prior to surgery. Men may shave face and neck.  Contacts and dentures may not be worn into surgery.  Do not bring valuables to the hospital, including drivers license, insurance or credit cards.  [facility] is not responsible for any belongings or valuables.   TAKE THESE MEDICATIONS THE MORNING OF SURGERY:  NONE  Use CHG Soap as directed on instruction sheet.  NOW!  Stop  ASPIRIN and Anti-inflammatories (NSAIDS) such as Advil, Aleve, Ibuprofen, Motrin, Naproxen, Naprosyn and Aspirin based products such as Excedrin, Goodys Powder, BC Powder. (May take Tylenol or Acetaminophen if needed.)  NOW!  Stop ANY OVER THE COUNTER supplements until after surgery.  Wear comfortable clothing (specific to your surgery type) to the hospital.  Plan for stool softeners for home use.  If you are being discharged the day of surgery, you will not be allowed to drive home. You will need a responsible adult to drive you home and stay with you that night.   If you are taking public transportation, you will need to have a responsible adult with you. Please confirm with your physician that it is acceptable to use public transportation.   Please call [phone] if you have any questions about these instructions.

## 2017-08-24 ENCOUNTER — Encounter: Payer: Self-pay | Admitting: *Deleted

## 2017-08-24 ENCOUNTER — Other Ambulatory Visit: Payer: Self-pay

## 2017-08-24 ENCOUNTER — Encounter
Admission: RE | Disposition: A | Discharge status: Home / Self Care | Payer: Self-pay | Source: Ambulatory Visit | Attending: Surgery

## 2017-08-24 ENCOUNTER — Ambulatory Visit
Admission: RE | Admit: 2017-08-24 | Discharge: 2017-08-24 | Disposition: A | Discharge status: Home / Self Care | Payer: Medicare Other | Source: Ambulatory Visit | Attending: Surgery | Admitting: Surgery

## 2017-08-24 ENCOUNTER — Ambulatory Visit: Payer: Medicare Other | Admitting: Anesthesiology

## 2017-08-24 DIAGNOSIS — Z7982 Long term (current) use of aspirin: Secondary | ICD-10-CM | POA: Insufficient documentation

## 2017-08-24 DIAGNOSIS — Z87891 Personal history of nicotine dependence: Secondary | ICD-10-CM | POA: Insufficient documentation

## 2017-08-24 DIAGNOSIS — Z87442 Personal history of urinary calculi: Secondary | ICD-10-CM | POA: Insufficient documentation

## 2017-08-24 DIAGNOSIS — M24111 Other articular cartilage disorders, right shoulder: Secondary | ICD-10-CM | POA: Insufficient documentation

## 2017-08-24 DIAGNOSIS — Z79899 Other long term (current) drug therapy: Secondary | ICD-10-CM | POA: Insufficient documentation

## 2017-08-24 DIAGNOSIS — S46101A Unspecified injury of muscle, fascia and tendon of long head of biceps, right arm, initial encounter: Secondary | ICD-10-CM | POA: Diagnosis not present

## 2017-08-24 DIAGNOSIS — Z85828 Personal history of other malignant neoplasm of skin: Secondary | ICD-10-CM | POA: Insufficient documentation

## 2017-08-24 DIAGNOSIS — M7581 Other shoulder lesions, right shoulder: Secondary | ICD-10-CM | POA: Diagnosis not present

## 2017-08-24 DIAGNOSIS — I10 Essential (primary) hypertension: Secondary | ICD-10-CM | POA: Insufficient documentation

## 2017-08-24 DIAGNOSIS — F419 Anxiety disorder, unspecified: Secondary | ICD-10-CM | POA: Insufficient documentation

## 2017-08-24 DIAGNOSIS — G8918 Other acute postprocedural pain: Secondary | ICD-10-CM | POA: Diagnosis not present

## 2017-08-24 DIAGNOSIS — J449 Chronic obstructive pulmonary disease, unspecified: Secondary | ICD-10-CM | POA: Insufficient documentation

## 2017-08-24 DIAGNOSIS — X58XXXA Exposure to other specified factors, initial encounter: Secondary | ICD-10-CM | POA: Insufficient documentation

## 2017-08-24 DIAGNOSIS — M75111 Incomplete rotator cuff tear or rupture of right shoulder, not specified as traumatic: Secondary | ICD-10-CM | POA: Insufficient documentation

## 2017-08-24 DIAGNOSIS — M19011 Primary osteoarthritis, right shoulder: Secondary | ICD-10-CM | POA: Diagnosis not present

## 2017-08-24 DIAGNOSIS — E785 Hyperlipidemia, unspecified: Secondary | ICD-10-CM | POA: Insufficient documentation

## 2017-08-24 DIAGNOSIS — S46811A Strain of other muscles, fascia and tendons at shoulder and upper arm level, right arm, initial encounter: Secondary | ICD-10-CM | POA: Insufficient documentation

## 2017-08-24 DIAGNOSIS — S46011A Strain of muscle(s) and tendon(s) of the rotator cuff of right shoulder, initial encounter: Secondary | ICD-10-CM | POA: Diagnosis not present

## 2017-08-24 DIAGNOSIS — Z8673 Personal history of transient ischemic attack (TIA), and cerebral infarction without residual deficits: Secondary | ICD-10-CM | POA: Insufficient documentation

## 2017-08-24 DIAGNOSIS — Z8249 Family history of ischemic heart disease and other diseases of the circulatory system: Secondary | ICD-10-CM | POA: Insufficient documentation

## 2017-08-24 DIAGNOSIS — Z86718 Personal history of other venous thrombosis and embolism: Secondary | ICD-10-CM | POA: Insufficient documentation

## 2017-08-24 DIAGNOSIS — M1990 Unspecified osteoarthritis, unspecified site: Secondary | ICD-10-CM | POA: Insufficient documentation

## 2017-08-24 DIAGNOSIS — G5602 Carpal tunnel syndrome, left upper limb: Secondary | ICD-10-CM | POA: Insufficient documentation

## 2017-08-24 DIAGNOSIS — M25511 Pain in right shoulder: Secondary | ICD-10-CM | POA: Diagnosis not present

## 2017-08-24 HISTORY — PX: SHOULDER ARTHROSCOPY WITH OPEN ROTATOR CUFF REPAIR: SHX6092

## 2017-08-24 SURGERY — ARTHROSCOPY, SHOULDER WITH REPAIR, ROTATOR CUFF, OPEN
Anesthesia: General | Site: Shoulder | Laterality: Right | Wound class: "Clean "

## 2017-08-24 MED ORDER — ESMOLOL HCL 100 MG/10ML IV SOLN
INTRAVENOUS | Status: DC | PRN
  Administered 2017-08-24: 20 mg via INTRAVENOUS

## 2017-08-24 MED ORDER — ROPIVACAINE HCL 5 MG/ML IJ SOLN
INTRAMUSCULAR | Status: AC
  Filled 2017-08-24: qty 30

## 2017-08-24 MED ORDER — SUGAMMADEX SODIUM 200 MG/2ML IV SOLN
INTRAVENOUS | Status: DC | PRN
  Administered 2017-08-24: 150 mg via INTRAVENOUS

## 2017-08-24 MED ORDER — EPINEPHRINE PF 1 MG/ML IJ SOLN
INTRAMUSCULAR | Status: AC
  Filled 2017-08-24: qty 1

## 2017-08-24 MED ORDER — EPHEDRINE SULFATE 50 MG/ML IJ SOLN
INTRAMUSCULAR | Status: AC
  Filled 2017-08-24: qty 1

## 2017-08-24 MED ORDER — BUPIVACAINE HCL (PF) 0.5 % IJ SOLN
INTRAMUSCULAR | Status: AC
  Filled 2017-08-24: qty 10

## 2017-08-24 MED ORDER — LACTATED RINGERS IV SOLN
INTRAVENOUS | Status: DC
  Administered 2017-08-24: 10:00:00 via INTRAVENOUS

## 2017-08-24 MED ORDER — BUPIVACAINE-EPINEPHRINE 0.5% -1:200000 IJ SOLN
INTRAMUSCULAR | Status: DC | PRN
  Administered 2017-08-24: 10 mL

## 2017-08-24 MED ORDER — LIDOCAINE HCL (CARDIAC) PF 100 MG/5ML IV SOSY
PREFILLED_SYRINGE | INTRAVENOUS | Status: DC | PRN
  Administered 2017-08-24: 80 mg via INTRAVENOUS

## 2017-08-24 MED ORDER — ROCURONIUM BROMIDE 100 MG/10ML IV SOLN
INTRAVENOUS | Status: DC | PRN
  Administered 2017-08-24: 50 mg via INTRAVENOUS

## 2017-08-24 MED ORDER — ACETAMINOPHEN 10 MG/ML IV SOLN
INTRAVENOUS | Status: AC
  Filled 2017-08-24: qty 100

## 2017-08-24 MED ORDER — OXYCODONE HCL 5 MG PO TABS: 5.0000 mg | ORAL_TABLET | ORAL | 0 refills | Status: DC | PRN

## 2017-08-24 MED ORDER — ONDANSETRON HCL 4 MG/2ML IJ SOLN
INTRAMUSCULAR | Status: AC
  Filled 2017-08-24: qty 2

## 2017-08-24 MED ORDER — DEXAMETHASONE SODIUM PHOSPHATE 10 MG/ML IJ SOLN
INTRAMUSCULAR | Status: DC | PRN
  Administered 2017-08-24: 5 mg via INTRAVENOUS

## 2017-08-24 MED ORDER — FENTANYL CITRATE (PF) 100 MCG/2ML IJ SOLN: 25.0000 ug | INTRAMUSCULAR | Status: DC | PRN

## 2017-08-24 MED ORDER — FENTANYL CITRATE (PF) 100 MCG/2ML IJ SOLN
INTRAMUSCULAR | Status: DC | PRN
  Administered 2017-08-24: 50 ug via INTRAVENOUS

## 2017-08-24 MED ORDER — BUPIVACAINE LIPOSOME 1.3 % IJ SUSP
INTRAMUSCULAR | Status: DC | PRN
  Administered 2017-08-24: 20 mL via PERINEURAL

## 2017-08-24 MED ORDER — ONDANSETRON HCL 4 MG/2ML IJ SOLN
INTRAMUSCULAR | Status: DC | PRN
  Administered 2017-08-24: 4 mg via INTRAVENOUS

## 2017-08-24 MED ORDER — LACTATED RINGERS IV SOLN
INTRAVENOUS | Status: DC | PRN
  Administered 2017-08-24: 2 mL

## 2017-08-24 MED ORDER — PROPOFOL 10 MG/ML IV BOLUS
INTRAVENOUS | Status: DC | PRN
  Administered 2017-08-24: 120 mg via INTRAVENOUS

## 2017-08-24 MED ORDER — MIDAZOLAM HCL 2 MG/2ML IJ SOLN
INTRAMUSCULAR | Status: AC
  Administered 2017-08-24: 1 mg via INTRAVENOUS
  Filled 2017-08-24: qty 2

## 2017-08-24 MED ORDER — BUPIVACAINE HCL (PF) 0.5 % IJ SOLN
INTRAMUSCULAR | Status: DC | PRN
  Administered 2017-08-24: 10 mL via PERINEURAL

## 2017-08-24 MED ORDER — DEXAMETHASONE SODIUM PHOSPHATE 10 MG/ML IJ SOLN
INTRAMUSCULAR | Status: AC
  Filled 2017-08-24: qty 1

## 2017-08-24 MED ORDER — FENTANYL CITRATE (PF) 100 MCG/2ML IJ SOLN
50.0000 ug | Freq: Once | INTRAMUSCULAR | Status: AC
  Administered 2017-08-24: 50 ug via INTRAVENOUS

## 2017-08-24 MED ORDER — OXYCODONE HCL 5 MG/5ML PO SOLN: 5.0000 mg | Freq: Once | ORAL | Status: DC | PRN

## 2017-08-24 MED ORDER — EPHEDRINE SULFATE 50 MG/ML IJ SOLN
INTRAMUSCULAR | Status: DC | PRN
  Administered 2017-08-24: 10 mg via INTRAVENOUS
  Administered 2017-08-24 (×2): 7.5 mg via INTRAVENOUS
  Administered 2017-08-24: 5 mg via INTRAVENOUS
  Administered 2017-08-24 (×2): 10 mg via INTRAVENOUS

## 2017-08-24 MED ORDER — FENTANYL CITRATE (PF) 100 MCG/2ML IJ SOLN
INTRAMUSCULAR | Status: AC
  Filled 2017-08-24: qty 2

## 2017-08-24 MED ORDER — LIDOCAINE HCL (PF) 2 % IJ SOLN
INTRAMUSCULAR | Status: AC
  Filled 2017-08-24: qty 10

## 2017-08-24 MED ORDER — OXYCODONE HCL 5 MG PO TABS: 5.0000 mg | ORAL_TABLET | Freq: Once | ORAL | Status: DC | PRN

## 2017-08-24 MED ORDER — LABETALOL HCL 5 MG/ML IV SOLN
INTRAVENOUS | Status: DC | PRN
  Administered 2017-08-24: 7.5 mg via INTRAVENOUS

## 2017-08-24 MED ORDER — PROMETHAZINE HCL 25 MG/ML IJ SOLN: 6.2500 mg | INTRAMUSCULAR | Status: DC | PRN

## 2017-08-24 MED ORDER — PROPOFOL 10 MG/ML IV BOLUS
INTRAVENOUS | Status: AC
  Filled 2017-08-24: qty 20

## 2017-08-24 MED ORDER — MEPERIDINE HCL 50 MG/ML IJ SOLN: 6.2500 mg | INTRAMUSCULAR | Status: DC | PRN

## 2017-08-24 MED ORDER — ACETAMINOPHEN 10 MG/ML IV SOLN
INTRAVENOUS | Status: DC | PRN
  Administered 2017-08-24: 1000 mg via INTRAVENOUS

## 2017-08-24 MED ORDER — FENTANYL CITRATE (PF) 100 MCG/2ML IJ SOLN
INTRAMUSCULAR | Status: AC
  Administered 2017-08-24: 50 ug via INTRAVENOUS
  Filled 2017-08-24: qty 2

## 2017-08-24 MED ORDER — CEFAZOLIN SODIUM-DEXTROSE 2-4 GM/100ML-% IV SOLN
INTRAVENOUS | Status: AC
  Filled 2017-08-24: qty 100

## 2017-08-24 MED ORDER — LIDOCAINE HCL (PF) 1 % IJ SOLN
INTRAMUSCULAR | Status: AC
  Filled 2017-08-24: qty 5

## 2017-08-24 MED ORDER — FAMOTIDINE 20 MG PO TABS: 20.0000 mg | ORAL_TABLET | Freq: Once | ORAL | Status: DC

## 2017-08-24 MED ORDER — ROCURONIUM BROMIDE 50 MG/5ML IV SOLN
INTRAVENOUS | Status: AC
  Filled 2017-08-24: qty 1

## 2017-08-24 MED ORDER — LIDOCAINE HCL (PF) 1 % IJ SOLN
INTRAMUSCULAR | Status: DC | PRN
  Administered 2017-08-24: 3 mL

## 2017-08-24 MED ORDER — MIDAZOLAM HCL 2 MG/2ML IJ SOLN
1.0000 mg | Freq: Once | INTRAMUSCULAR | Status: AC
  Administered 2017-08-24: 1 mg via INTRAVENOUS

## 2017-08-24 MED ORDER — BUPIVACAINE-EPINEPHRINE (PF) 0.5% -1:200000 IJ SOLN
INTRAMUSCULAR | Status: AC
  Filled 2017-08-24: qty 30

## 2017-08-24 SURGICAL SUPPLY — 49 items
ANCHOR JUGGERKNOT WTAP NDL 2.9 (Anchor) ×1 IMPLANT
ANCHOR TENDON REGENETEN (Staple) ×1 IMPLANT
ANCHORS BONE REGENETEN (Anchor) ×1 IMPLANT
BIT DRILL JUGRKNT W/NDL BIT2.9 (DRILL) IMPLANT
BLADE FULL RADIUS 3.5 (BLADE) ×1 IMPLANT
BUR ACROMIONIZER 4.0 (BURR) ×1 IMPLANT
BURR OVAL 8 FLU 4.0X13 (MISCELLANEOUS) ×1 IMPLANT
CANNULA SHAVER 8MMX76MM (CANNULA) ×1 IMPLANT
CHLORAPREP W/TINT 26ML (MISCELLANEOUS) ×2 IMPLANT
COVER MAYO STAND STRL (DRAPES) ×2 IMPLANT
CUTTER BONE 4.0MM X 13CM (MISCELLANEOUS) ×1 IMPLANT
DRAPE IMP U-DRAPE 54X76 (DRAPES) ×4 IMPLANT
DRILL JUGGERKNOT W/NDL BIT 2.9 (DRILL) ×2
DW OUTFLOW CASSETTE/TUBE SET (MISCELLANEOUS) ×1 IMPLANT
ELECT REM PT RETURN 9FT ADLT (ELECTROSURGICAL) ×2
ELECTRODE REM PT RTRN 9FT ADLT (ELECTROSURGICAL) ×1 IMPLANT
GAUZE PETRO XEROFOAM 1X8 (MISCELLANEOUS) ×2 IMPLANT
GAUZE SPONGE 4X4 12PLY STRL (GAUZE/BANDAGES/DRESSINGS) ×2 IMPLANT
GLOVE BIO SURGEON STRL SZ7.5 (GLOVE) ×4 IMPLANT
GLOVE BIO SURGEON STRL SZ8 (GLOVE) ×4 IMPLANT
GLOVE BIOGEL PI IND STRL 8 (GLOVE) ×1 IMPLANT
GLOVE BIOGEL PI INDICATOR 8 (GLOVE) ×1
GLOVE INDICATOR 8.0 STRL GRN (GLOVE) ×2 IMPLANT
GOWN STRL REUS W/ TWL LRG LVL3 (GOWN DISPOSABLE) ×1 IMPLANT
GOWN STRL REUS W/ TWL XL LVL3 (GOWN DISPOSABLE) ×1 IMPLANT
GOWN STRL REUS W/TWL LRG LVL3 (GOWN DISPOSABLE) ×1
GOWN STRL REUS W/TWL XL LVL3 (GOWN DISPOSABLE) ×1
GRASPER SUT 15 45D LOW PRO (SUTURE) IMPLANT
IMPLANT REGENETEN MEDIUM (Shoulder) ×1 IMPLANT
IV LACTATED RINGER IRRG 3000ML (IV SOLUTION) ×2
IV LR IRRIG 3000ML ARTHROMATIC (IV SOLUTION) ×2 IMPLANT
MANIFOLD NEPTUNE II (INSTRUMENTS) ×1 IMPLANT
MASK FACE SPIDER DISP (MASK) ×2 IMPLANT
MAT ABSORB  FLUID 56X50 GRAY (MISCELLANEOUS) ×1
MAT ABSORB FLUID 56X50 GRAY (MISCELLANEOUS) ×1 IMPLANT
PACK ARTHROSCOPY SHOULDER (MISCELLANEOUS) ×2 IMPLANT
PROBE APOLLO 90XL (SURGICAL WAND) ×1 IMPLANT
SLING ARM LRG DEEP (SOFTGOODS) ×2 IMPLANT
SLING ULTRA II LG (MISCELLANEOUS) ×2 IMPLANT
STAPLER SKIN PROX 35W (STAPLE) ×2 IMPLANT
STRAP SAFETY 5IN WIDE (MISCELLANEOUS) ×2 IMPLANT
SUT ETHIBOND 0 MO6 C/R (SUTURE) ×2 IMPLANT
SUT VIC AB 2-0 CT1 27 (SUTURE) ×2
SUT VIC AB 2-0 CT1 TAPERPNT 27 (SUTURE) ×2 IMPLANT
TAPE MICROFOAM 4IN (TAPE) ×2 IMPLANT
TUBING ARTHRO INFLOW-ONLY STRL (TUBING) ×1 IMPLANT
TUBING CONNECTING 10 (TUBING) ×1 IMPLANT
TUBING REDEUCE PAT W/CON 8IN (MISCELLANEOUS) ×1 IMPLANT
WAND HAND CNTRL MULTIVAC 90 (MISCELLANEOUS) ×1 IMPLANT

## 2017-08-24 NOTE — Anesthesia Procedure Notes (Signed)
Procedure Name: Intubation Date/Time: 08/24/2017 10:24 AM Performed by: Lowry Bowl, CRNA Pre-anesthesia Checklist: Patient identified, Emergency Drugs available, Suction available and Patient being monitored Patient Re-evaluated:Patient Re-evaluated prior to induction Oxygen Delivery Method: Circle system utilized Preoxygenation: Pre-oxygenation with 100% oxygen Induction Type: IV induction and Cricoid Pressure applied Ventilation: Mask ventilation without difficulty Laryngoscope Size: Mac and 4 Grade View: Grade I Tube type: Oral Tube size: 7.5 mm Number of attempts: 1 Airway Equipment and Method: Stylet Placement Confirmation: ETT inserted through vocal cords under direct vision,  positive ETCO2 and breath sounds checked- equal and bilateral Secured at: 22 cm Tube secured with: Tape Dental Injury: Teeth and Oropharynx as per pre-operative assessment

## 2017-08-24 NOTE — Op Note (Signed)
08/24/2017  12:01 PM  Patient:   [name] Browning  Pre-Op Diagnosis:   Nontraumatic incomplete rotator cuff tear with tearing of biceps tendinitis, right shoulder.  Post-Op Diagnosis:   Nontraumatic complete rotator cuff tear, chronic biceps tendon rupture, degenerative labral fraying, and early degenerative joint disease, right shoulder.  Procedure:   Extensive arthroscopic debridement, arthroscopic subacromial decompression, mini-open rotator cuff repair using the [place] patch, right shoulder.  Anesthesia:   General endotracheal with interscalene block placed preoperatively by the anesthesiologist.  Surgeon:   Pascal Lux, MD  Assistant:   Cameron Proud, PA-C  Findings:   As above.  There was a full-thickness tear involving the mid-insertional fibers of the supraspinatus tendon measuring approximately 1.0 x 1.5 cm with significant degenerative changes of the adjacent tendon extending to an area of approximately 1.5 x 2.5 cm.  There also was partial-thickness tearing of the superior insertional fibers of the subscapularis tendon.  The biceps tendon had ruptured and retracted distally, leaving a stump intra-articularly.  There was moderate degenerative fraying of the anterior, superior, and posterior aspects of the glenoid labrum without frank detachment from the glenoid.  There also were diffuse grade 1-2 chondromalacial changes involving both the glenoid and humeral head.  Complications:   None  Fluids:   700 cc  Estimated blood loss:   5 cc  Tourniquet time:   None  Drains:   None  Closure:   Staples      Brief clinical note:   The patient is a 77 year old male with a long history of gradually worsening right shoulder pain. The patient's symptoms have progressed despite medications, activity modification, etc. The patient's history and examination are consistent with impingement/tendinopathy with a rotator cuff tear. These findings were confirmed by MRI scan. The  patient presents at this time for definitive management of these shoulder symptoms.  Procedure:   The patient underwent placement of an interscalene block by the anesthesiologist in the preoperative holding area before being brought into the operating room and lain in the supine position. The patient then underwent general endotracheal intubation and anesthesia before being repositioned in the beach chair position using the beach chair positioner. The right shoulder and upper extremity were prepped with ChloraPrep solution before being draped sterilely. Preoperative antibiotics were administered. A timeout was performed to confirm the proper surgical site before the expected portal sites and incision site were injected with 0.5% Sensorcaine with epinephrine. A posterior portal was created and the glenohumeral joint thoroughly inspected with the findings as described above. An anterior portal was created using an outside-in technique. The labrum and rotator cuff were further probed, again confirming the above-noted findings.  Areas of labral fraying were debrided back to stable margins using the full-radius resector, as was the biceps stump and adjacent synovitis.  There are also areas of articular degenerative changes that were debrided back to stable margins using the full-radius resector.  The ArthroCare wand was inserted and used to obtain hemostasis as well as to "anneal" the labrum posteriorly, superiorly, and anteriorly. The instruments were removed from the joint after suctioning the excess fluid.  The camera was repositioned through the posterior portal into the subacromial space. A separate lateral portal was created using an outside-in technique. The 3.5 mm full-radius resector was introduced and used to perform a subtotal bursectomy. The ArthroCare wand was then inserted and used to remove the periosteal tissue off the undersurface of the anterior third of the acromion as well as to  recess the  coracoacromial ligament from its attachment along the anterior and lateral margins of the acromion. The 4.0 mm acromionizing bur was introduced and used to complete the decompression by removing the undersurface of the anterior third of the acromion. The full radius resector was reintroduced to remove any residual bony debris before the ArthroCare wand was reintroduced to obtain hemostasis. The instruments were then removed from the subacromial space after suctioning the excess fluid.  An approximately 4-5 cm incision was made over the anterolateral aspect of the shoulder beginning at the anterolateral corner of the acromion and extending distally in line with the bicipital groove. This incision was carried down through the subcutaneous tissues to expose the deltoid fascia. The raphae between the anterior and middle thirds was identified and this plane developed to provide access into the subacromial space. Additional bursal tissues were debrided sharply using Metzenbaum scissors. The rotator cuff tear was readily identified. The margins were debrided sharply with a #15 blade and the exposed greater tuberosity roughened with a rongeur. The tear was repaired using a single Biomet 2.9 mm JuggerKnot anchor. A medium-sized Louise patch was applied over the repair site and secured using the appropriate bone and soft tissue staples in the hopes that this might improve the healing potential of this severely degenerative and compromised tendon tissue. An apparent watertight closure was obtained. Given that the biceps tendon had already chronically torn and retracted distally, no attempt was made to retrieve the tendon to perform a biceps tenodesis.  The wound was copiously irrigated with sterile saline solution before the deltoid raphae was reapproximated using 2-0 Vicryl interrupted sutures. The subcutaneous tissues were closed in two layers using 2-0 Vicryl interrupted sutures before the skin was  closed using staples. The portal sites also were closed using staples. A sterile bulky dressing was applied to the shoulder before the arm was placed into a shoulder immobilizer. The patient was then awakened, extubated, and returned to the recovery room in satisfactory condition after tolerating the procedure well.

## 2017-08-24 NOTE — Discharge Instructions (Addendum)
° ° ° °  Orthopedic discharge instructions: Keep dressing dry and intact.  May shower after dressing changed on post-op day #4 (Monday).  Cover staples with Band-Aids after drying off. Apply ice frequently to shoulder. Take ibuprofen 600 mg TID with meals for 7-10 days, then as necessary. Take oxycodone as prescribed when needed.  May supplement with ES Tylenol if necessary. Keep shoulder immobilizer on at all times except may remove for bathing purposes. Follow-up in 10-14 days or as scheduled.    AMBULATORY SURGERY  DISCHARGE INSTRUCTIONS   1) The drugs that you were given will stay in your system until tomorrow so for the next 24 hours you should not:  A) Drive an automobile B) Make any legal decisions C) Drink any alcoholic beverage   2) You may resume regular meals tomorrow.  Today it is better to start with liquids and gradually work up to solid foods.  You may eat anything you prefer, but it is better to start with liquids, then soup and crackers, and gradually work up to solid foods.   3) Please notify your doctor immediately if you have any unusual bleeding, trouble breathing, redness and pain at the surgery site, drainage, fever, or pain not relieved by medication.    4) Additional Instructions:        Please contact your physician with any problems or Same Day Surgery at [phone], Monday through Friday 6 am to 4 pm, or [place] at [facility] number at [phone].

## 2017-08-24 NOTE — Transfer of Care (Signed)
Immediate Anesthesia Transfer of Care Note  Patient: [name] Browning  Procedure(s) Performed: SHOULDER ARTHROSCOPY WITH OPEN ROTATOR CUFF REPAIR (Right Shoulder)  Patient Location: PACU  Anesthesia Type:GA combined with regional for post-op pain  Level of Consciousness: awake, oriented, drowsy and patient cooperative  Airway & Oxygen Therapy: Patient Spontanous Breathing  Post-op Assessment: Report given to RN, Post -op Vital signs reviewed and stable and Patient moving all extremities  Post vital signs: Reviewed and stable  Last Vitals:  Vitals Value Taken Time  BP 168/92 08/24/2017 12:08 PM  Temp 36.2 C 08/24/2017 12:08 PM  Pulse 71 08/24/2017 12:11 PM  Resp 18 08/24/2017 12:11 PM  SpO2 100 % 08/24/2017 12:11 PM  Vitals shown include unvalidated device data.  Last Pain:  Vitals:   08/24/17 0815  TempSrc: Tympanic  PainSc: 5          Complications: No apparent anesthesia complications

## 2017-08-24 NOTE — Anesthesia Post-op Follow-up Note (Signed)
Anesthesia QCDR form completed.        

## 2017-08-24 NOTE — H&P (Signed)
Paper H&P to be scanned into permanent record. H&P reviewed and patient re-examined. No changes. 

## 2017-08-24 NOTE — Anesthesia Preprocedure Evaluation (Signed)
Anesthesia Evaluation  Patient identified by MRN, date of birth, ID band Patient awake    Reviewed: Allergy & Precautions, NPO status , Patient's Chart, lab work & pertinent test results  History of Anesthesia Complications Negative for: history of anesthetic complications  Airway Mallampati: II  TM Distance: >3 FB Neck ROM: Full    Dental  (+) Poor Dentition, Missing   Pulmonary neg sleep apnea, COPD, former smoker,    breath sounds clear to auscultation- rhonchi (-) wheezing      Cardiovascular hypertension, Pt. on medications (-) CAD, (-) Past MI, (-) Cardiac Stents and (-) CABG  Rhythm:Regular Rate:Normal - Systolic murmurs and - Diastolic murmurs    Neuro/Psych Anxiety TIAnegative neurological ROS     GI/Hepatic negative GI ROS, Neg liver ROS,   Endo/Other  negative endocrine ROSneg diabetes  Renal/GU Renal disease: hx of nephrolithiasis.     Musculoskeletal  (+) Arthritis ,   Abdominal (+) - obese,   Peds  Hematology negative hematology ROS (+)   Anesthesia Other Findings Past Medical History: No date: Anxiety No date: Cancer [facility])     Comment:  skin cancer on right ear and right forehead removed No date: Carpal tunnel syndrome     Comment:  Both wrists. No date: Carpal tunnel syndrome No date: COPD (chronic obstructive pulmonary disease) (HCC) No date: DVT (deep venous thrombosis) (HCC)     Comment:  taken off anticoagulation after about 6 months No date: HLD (hyperlipidemia) No date: Hypertension No date: Kidney stones No date: Leaky heart valve No date: Normal coronary arteries     Comment:  a. by cardiac cath in 2004; b. normal stress test in               2007 and 2011; c. Lexiscan 05/20/2014: no significant               ischemia, EF 55-65%, no EKG changes, low risk study  No date: Pneumonia No date: Stroke [facility])     Comment:  TIA in 2016 No date: Syncope   Reproductive/Obstetrics                              Anesthesia Physical Anesthesia Plan  ASA: II  Anesthesia Plan: General   Post-op Pain Management:  Regional for Post-op pain   Induction: Intravenous  PONV Risk Score and Plan: 1 and Ondansetron and Midazolam  Airway Management Planned: Oral ETT  Additional Equipment:   Intra-op Plan:   Post-operative Plan: Extubation in OR  Informed Consent: I have reviewed the patients History and Physical, chart, labs and discussed the procedure including the risks, benefits and alternatives for the proposed anesthesia with the patient or authorized representative who has indicated his/her understanding and acceptance.   Dental advisory given  Plan Discussed with: CRNA and Anesthesiologist  Anesthesia Plan Comments:         Anesthesia Quick Evaluation

## 2017-08-24 NOTE — Anesthesia Postprocedure Evaluation (Signed)
Anesthesia Post Note  Patient: [name] Browning  Procedure(s) Performed: SHOULDER ARTHROSCOPY WITH OPEN ROTATOR CUFF REPAIR (Right Shoulder)  Patient location during evaluation: PACU Anesthesia Type: General Level of consciousness: awake and alert and oriented Pain management: pain level controlled Vital Signs Assessment: post-procedure vital signs reviewed and stable Respiratory status: spontaneous breathing, nonlabored ventilation and respiratory function stable Cardiovascular status: blood pressure returned to baseline and stable Postop Assessment: no signs of nausea or vomiting Anesthetic complications: no     Last Vitals:  Vitals:   08/24/17 1223 08/24/17 1238  BP: (!) 163/89 (!) 156/83  Pulse: 71 66  Resp: 19 16  Temp:    SpO2: 97% 98%    Last Pain:  Vitals:   08/24/17 1208  TempSrc:   PainSc: 0-No pain                 Windi Toro

## 2017-08-24 NOTE — Anesthesia Procedure Notes (Signed)
Anesthesia Regional Block: Interscalene brachial plexus block   Pre-Anesthetic Checklist: ,, timeout performed, Correct Patient, Correct Site, Correct Laterality, Correct Procedure, Correct Position, site marked, Risks and benefits discussed,  Surgical consent,  Pre-op evaluation,  At surgeon's request and post-op pain management  Laterality: Right  Prep: chloraprep, alcohol swabs       Needles:  Injection technique: Single-shot  Needle Type: Stimiplex     Needle Length: 10cm  Needle Gauge: 21     Additional Needles:   Procedures:,,,, ultrasound used (permanent image in chart),,,,  Narrative:  Start time: 08/24/2017 10:00 AM End time: 08/24/2017 10:05 AM Injection made incrementally with aspirations every 5 mL.  Performed by: Personally  Anesthesiologist: Emmie Niemann, MD  Additional Notes: Functioning IV was confirmed and monitors were applied.  A Stimuplex needle was used. Sterile prep and drape,hand hygiene and sterile gloves were used.  Negative aspiration and negative test dose prior to incremental administration of local anesthetic. The patient tolerated the procedure well.

## 2017-08-24 NOTE — Progress Notes (Signed)
Can wiggle fingers on right    Capillary refill positive to right hand   Skin warm and dry

## 2017-08-26 ENCOUNTER — Other Ambulatory Visit: Payer: Self-pay

## 2017-08-26 ENCOUNTER — Emergency Department
Admission: EM | Admit: 2017-08-26 | Discharge: 2017-08-27 | Disposition: A | Discharge status: Home / Self Care | Payer: Medicare Other | Attending: Emergency Medicine | Admitting: Emergency Medicine

## 2017-08-26 DIAGNOSIS — M25511 Pain in right shoulder: Secondary | ICD-10-CM

## 2017-08-26 DIAGNOSIS — I10 Essential (primary) hypertension: Secondary | ICD-10-CM | POA: Insufficient documentation

## 2017-08-26 DIAGNOSIS — Z79899 Other long term (current) drug therapy: Secondary | ICD-10-CM | POA: Insufficient documentation

## 2017-08-26 DIAGNOSIS — Z87891 Personal history of nicotine dependence: Secondary | ICD-10-CM | POA: Insufficient documentation

## 2017-08-26 DIAGNOSIS — R110 Nausea: Secondary | ICD-10-CM | POA: Diagnosis not present

## 2017-08-26 DIAGNOSIS — J449 Chronic obstructive pulmonary disease, unspecified: Secondary | ICD-10-CM | POA: Insufficient documentation

## 2017-08-26 DIAGNOSIS — Z85828 Personal history of other malignant neoplasm of skin: Secondary | ICD-10-CM | POA: Insufficient documentation

## 2017-08-26 DIAGNOSIS — R509 Fever, unspecified: Secondary | ICD-10-CM | POA: Diagnosis not present

## 2017-08-26 DIAGNOSIS — G8918 Other acute postprocedural pain: Secondary | ICD-10-CM | POA: Diagnosis not present

## 2017-08-26 LAB — COMPREHENSIVE METABOLIC PANEL
ALT: 19 U/L (ref 0–44)
ANION GAP: 5 (ref 5–15)
AST: 32 U/L (ref 15–41)
Albumin: 3.8 g/dL (ref 3.5–5.0)
Alkaline Phosphatase: 83 U/L (ref 38–126)
BUN: 23 mg/dL (ref 8–23)
CALCIUM: 8.6 mg/dL — AB (ref 8.9–10.3)
CHLORIDE: 108 mmol/L (ref 98–111)
CO2: 25 mmol/L (ref 22–32)
Creatinine, Ser: 0.88 mg/dL (ref 0.61–1.24)
GFR calc non Af Amer: >60 mL/min (ref >60)
GLUCOSE: 102 mg/dL — AB (ref 70–99)
POTASSIUM: 3.9 mmol/L (ref 3.5–5.1)
SODIUM: 138 mmol/L (ref 135–145)
Total Bilirubin: 0.6 mg/dL (ref 0.3–1.2)
Total Protein: 6.7 g/dL (ref 6.5–8.1)

## 2017-08-26 LAB — CBC WITH DIFFERENTIAL/PLATELET
Basophils Absolute: 0 10*3/uL (ref 0–0.1)
Basophils Relative: 0 %
EOS ABS: 0.1 10*3/uL (ref 0–0.7)
Eosinophils Relative: 1 %
HCT: 36 % — ABNORMAL LOW (ref 40.0–52.0)
HEMOGLOBIN: 12.7 g/dL — AB (ref 13.0–18.0)
LYMPHS ABS: 1.4 10*3/uL (ref 1.0–3.6)
Lymphocytes Relative: 17 %
MCH: 32.5 pg (ref 26.0–34.0)
MCHC: 35.3 g/dL (ref 32.0–36.0)
MCV: 91.9 fL (ref 80.0–100.0)
MONOS PCT: 10 %
Monocytes Absolute: 0.8 10*3/uL (ref 0.2–1.0)
NEUTROS PCT: 72 %
Neutro Abs: 5.9 10*3/uL (ref 1.4–6.5)
PLATELETS: 166 10*3/uL (ref 150–440)
RBC: 3.92 MIL/uL — ABNORMAL LOW (ref 4.40–5.90)
RDW: 13.4 % (ref 11.5–14.5)
WBC: 8.2 10*3/uL (ref 3.8–10.6)

## 2017-08-26 LAB — LACTIC ACID, PLASMA: Lactic Acid, Venous: 0.9 mmol/L (ref 0.5–1.9)

## 2017-08-26 MED ORDER — ONDANSETRON HCL 4 MG/2ML IJ SOLN
4.0000 mg | Freq: Once | INTRAMUSCULAR | Status: AC
  Administered 2017-08-27: 4 mg via INTRAVENOUS
  Filled 2017-08-26: qty 2

## 2017-08-26 MED ORDER — MORPHINE SULFATE (PF) 4 MG/ML IV SOLN
4.0000 mg | Freq: Once | INTRAVENOUS | Status: AC
  Administered 2017-08-27: 4 mg via INTRAVENOUS
  Filled 2017-08-26: qty 1

## 2017-08-26 NOTE — ED Triage Notes (Signed)
Reports shoulder surgery (right) on Thursday, now with fever (100.1 at home), nausea and weakness.

## 2017-08-26 NOTE — ED Notes (Signed)
Pt states that his right shoulder has been hurting him since his surgery on Thursday. PT states his shoulder feels like its "stinging". Family at bedside

## 2017-08-27 ENCOUNTER — Emergency Department: Payer: Medicare Other

## 2017-08-27 DIAGNOSIS — R509 Fever, unspecified: Secondary | ICD-10-CM | POA: Diagnosis not present

## 2017-08-27 DIAGNOSIS — M25511 Pain in right shoulder: Secondary | ICD-10-CM | POA: Diagnosis not present

## 2017-08-27 LAB — URINALYSIS, COMPLETE (UACMP) WITH MICROSCOPIC
Bacteria, UA: NONE SEEN
Bilirubin Urine: NEGATIVE
GLUCOSE, UA: NEGATIVE mg/dL
Hgb urine dipstick: NEGATIVE
Ketones, ur: NEGATIVE mg/dL
Leukocytes, UA: NEGATIVE
Nitrite: NEGATIVE
PH: 5 (ref 5.0–8.0)
Protein, ur: NEGATIVE mg/dL
SPECIFIC GRAVITY, URINE: 1.018 (ref 1.005–1.030)
Squamous Epithelial / LPF: NONE SEEN (ref 0–5)

## 2017-08-27 MED ORDER — SODIUM CHLORIDE 0.9 % IV BOLUS
1000.0000 mL | Freq: Once | INTRAVENOUS | Status: AC
  Administered 2017-08-27: 1000 mL via INTRAVENOUS

## 2017-08-27 NOTE — ED Provider Notes (Signed)
[facility] Emergency Department Provider Note   ____________________________________________   First MD Initiated Contact with Patient 08/26/17 2339     (approximate)  I have reviewed the triage vital signs and the nursing notes.   HISTORY  Chief Complaint Post-op Problem    HPI [name] Browning is a 77 y.o. male who comes into the hospital today with some right shoulder pain and nausea.  The patient states that he had some rotator cuff repair on Thursday approximately 3 days ago.  He states that his arm is stinging.  He is taking his oxycodone but it does not seem to be helping the pain.  The pain started tonight so he did not contact his surgeon.  The patient rates his pain a 7 out of 10 in intensity currently.  He is able to move his fingers but they are numb all of the time because he has carpal tunnel syndrome.  He is here today for evaluation of his symptoms.   Past Medical History:  Diagnosis Date  . Anxiety   . Cancer ([place])    skin cancer on right ear and right forehead removed  . Carpal tunnel syndrome    Both wrists.  . Carpal tunnel syndrome   . COPD (chronic obstructive pulmonary disease) ([place])   . DVT (deep venous thrombosis) ([place])    taken off anticoagulation after about 6 months  . HLD (hyperlipidemia)   . Hypertension   . Kidney stones   . Leaky heart valve   . Normal coronary arteries    a. by cardiac cath in 2004; b. normal stress test in 2007 and 2011; c. Lexiscan 05/20/2014: no significant ischemia, EF 55-65%, no EKG changes, low risk study   . Pneumonia   . Stroke [facility])    TIA in 2016  . Syncope     Patient Active Problem List   Diagnosis Date Noted  . Chronic neck pain 03/09/2017  . Degenerative disc disease, cervical 03/09/2017  . Perennial allergic rhinitis 03/09/2017  . Hyperglycemia 09/01/2015  . Elevated rheumatoid factor 04/08/2015  . Arthritis of both hands 04/01/2015  . Bilateral hand pain 03/18/2015  .  Elevated alkaline phosphatase level 03/02/2015  . Anxiety 02/16/2015  . Intermittent chest pain 02/16/2015  . History of TIA (transient ischemic attack) 05/18/2014  . HTN (hypertension) 05/18/2014  . Hyperlipidemia 05/18/2014  . Status post carpal tunnel release 06/26/2013    Past Surgical History:  Procedure Laterality Date  . CARDIAC CATHETERIZATION  2004  . CARPAL TUNNEL RELEASE Right 06/11/2013  . SHOULDER ARTHROSCOPY WITH OPEN ROTATOR CUFF REPAIR Right 08/24/2017   Procedure: SHOULDER ARTHROSCOPY WITH OPEN ROTATOR CUFF REPAIR;  Surgeon: Corky Mull, MD;  Location: [facility] ORS;  Service: Orthopedics;  Laterality: Right;  . SKIN CANCER EXCISION  12/01/2016   right ear   . SKIN CANCER EXCISION     remove from the right side of the face   . THROMBECTOMY Right 2004   leg  . THYROIDECTOMY  1950   Not sure if total or partial thyroidectomy.    Prior to Admission medications   Medication Sig Start Date End Date Taking? Authorizing Provider  ALPRAZolam Duanne Moron) 0.5 MG tablet Take 0.5 tablets (0.25 mg total) by mouth 2 (two) times daily as needed for anxiety. Wean self off, try half twice daily for one week , after that half once a day for one week, after that half every other day and stop 03/09/17   Steele Sizer, MD  aspirin EC 81 MG tablet Take 81 mg by mouth daily as needed.     [provider]  lisinopril (PRINIVIL,ZESTRIL) 20 MG tablet Take 1 tablet (20 mg total) by mouth daily. 03/09/17   Steele Sizer, MD  metroNIDAZOLE (METROGEL) 0.75 % gel APPLY TO AFFECTED FACE TWICE DAILY 02/08/17   [provider]  oxyCODONE (ROXICODONE) 5 MG immediate release tablet Take 1-2 tablets (5-10 mg total) by mouth every 4 (four) hours as needed. 08/24/17   Poggi, Marshall Cork, MD  rosuvastatin (CRESTOR) 40 MG tablet Take 1 tablet (40 mg total) by mouth daily. 03/09/17   Steele Sizer, MD    Allergies Patient has no known allergies.  Family History  Problem Relation Age of Onset  .  Hypertension Mother   . Heart disease Mother   . CAD Father   . Heart attack Father     Social History Social History   Tobacco Use  . Smoking status: Former Smoker    Packs/day: 1.00    Years: 46.00    Pack years: 46.00    Types: Cigarettes    Start date: 01/10/1957    Last attempt to quit: 02/11/2003    Years since quitting: 14.5  . Smokeless tobacco: Former Systems developer    Types: Snuff    Quit date: 02/11/2003  Substance Use Topics  . Alcohol use: Yes    Alcohol/week: 7.0 standard drinks    Types: 7 Cans of beer per week  . Drug use: No    Review of Systems  Constitutional: No fever/chills Eyes: No visual changes. ENT: No sore throat. Cardiovascular: Denies chest pain. Respiratory: Denies shortness of breath. Gastrointestinal: No abdominal pain.  No nausea, no vomiting.  No diarrhea.  No constipation. Genitourinary: Negative for dysuria. Musculoskeletal: right shoulder pain Skin: Negative for rash. Neurological: Negative for headaches   ____________________________________________   PHYSICAL EXAM:  VITAL SIGNS: ED Triage Vitals  Enc Vitals Group     BP 08/26/17 2237 (!) 156/64     Pulse Rate 08/26/17 2237 77     Resp 08/26/17 2237 18     Temp 08/26/17 2237 98.7 F (37.1 C)     Temp Source 08/26/17 2237 Oral     SpO2 08/26/17 2237 93 %     Weight --      Height --      Head Circumference --      Peak Flow --      Pain Score 08/26/17 2351 7     Pain Loc --      Pain Edu? --      Excl. in [place]? --     Constitutional: Alert and oriented. Well appearing and in moderate distress. Eyes: Conjunctivae are normal. PERRL. EOMI. Head: Atraumatic. Nose: No congestion/rhinnorhea. Mouth/Throat: Mucous membranes are moist.  Oropharynx non-erythematous. Cardiovascular: Normal rate, regular rhythm. Grossly normal heart sounds.  Good peripheral circulation. Respiratory: Normal respiratory effort.  No retractions. Lungs CTAB. Gastrointestinal: Soft and nontender. Positive  bowel sounds. Musculoskeletal: pain with minimal passive range of motion, patient able to move fingers without difficulty, pulses intact distally, no swelling to arm or hand, no redness surrounding the bandage.  Neurologic:  Normal speech and language.  Skin:  Skin is warm, dry and intact.  Psychiatric: Mood and affect are normal.   ____________________________________________   LABS (all labs ordered are listed, but only abnormal results are displayed)  Labs Reviewed  CBC WITH DIFFERENTIAL/PLATELET - Abnormal; Notable for the following components:  Result Value   RBC 3.92 (*)    Hemoglobin 12.7 (*)    HCT 36.0 (*)    All other components within normal limits  COMPREHENSIVE METABOLIC PANEL - Abnormal; Notable for the following components:   Glucose, Bld 102 (*)    Calcium 8.6 (*)    All other components within normal limits  URINALYSIS, COMPLETE (UACMP) WITH MICROSCOPIC - Abnormal; Notable for the following components:   Color, Urine YELLOW (*)    APPearance CLEAR (*)    All other components within normal limits  CULTURE, BLOOD (ROUTINE X 2)  CULTURE, BLOOD (ROUTINE X 2)  LACTIC ACID, PLASMA  LACTIC ACID, PLASMA   ____________________________________________  EKG  none ____________________________________________  RADIOLOGY  ED MD interpretation:  CXR: Linear fibrosis or atelectasis in the right lung base, no consolidation or edema, aortic atherosclerosis  DG right shoulder: No acute bony abnormalities  Official radiology report(s): Dg Chest 1 View  Result Date: 08/27/2017 CLINICAL DATA:  Fever and right shoulder pain after surgery. EXAM: CHEST  1 VIEW COMPARISON:  07/06/2009 FINDINGS: Heart size and pulmonary vascularity are normal. Slight linear fibrosis or atelectasis in the right lung base. No airspace disease or consolidation. No blunting of costophrenic angles. No pneumothorax. Mediastinal contours appear intact. Calcification of the aorta. Degenerative  changes in the shoulders. Skin clips over the right shoulder consistent with recent surgery. Calcification in the left hemidiaphragm region possibly representing calcified granuloma in the lung base or spleen. IMPRESSION: Linear fibrosis or atelectasis in the right lung base. No consolidation or edema. Aortic atherosclerosis. Electronically Signed   By: Lucienne Capers M.D.   On: 08/27/2017 00:27   Dg Shoulder Right  Result Date: 08/27/2017 CLINICAL DATA:  Right shoulder pain since surgery on Thursday. Fever, weakness, and nausea. EXAM: RIGHT SHOULDER - 2+ VIEW COMPARISON:  MRI right shoulder 08/15/2017 FINDINGS: Skin clips consistent with recent surgery. Degenerative changes in the glenohumeral joint with joint space narrowing and osteophyte formation on both sides of the joint. No evidence of acute fracture or dislocation. No focal bone lesion or bone destruction. No radiopaque soft tissue foreign bodies. IMPRESSION: Degenerative changes in the right shoulder. No acute bony abnormalities. Electronically Signed   By: Lucienne Capers M.D.   On: 08/27/2017 00:25    ____________________________________________   PROCEDURES  Procedure(s) performed: None  Procedures  Critical Care performed: No  ____________________________________________   INITIAL IMPRESSION / ASSESSMENT AND PLAN / ED COURSE  As part of my medical decision making, I reviewed the following data within the electronic MEDICAL RECORD NUMBER Notes from prior ED visits and [place] Controlled Substance Database   This is a 77 year old male with a recent rotator cuff repair who comes into the hospital today with some right shoulder pain.  The patient reports that the pain is stinging and the medicine is not helping.  We did check some blood work as the patient had a temperature of 100.1.  His initial CBC and CMP are unremarkable.  The patient's lactic acid is also negative as are his x-rays.  I will give the patient a dose of morphine and  Zofran and we will await his urinalysis.  The patient's urinalysis is unremarkable.  He spent some time sleeping in the emergency department.  He will be discharged home to follow-up with his orthopedic surgeon for further evaluation of his pain.  I did instruct the patient that he can take an extra pill of his pain medicine if the one pill is not helping.  ____________________________________________   FINAL CLINICAL IMPRESSION(S) / ED DIAGNOSES  Final diagnoses:  Acute pain of right shoulder  Post-operative pain     ED Discharge Orders    None       Note:  This document was prepared using Dragon voice recognition software and may include unintentional dictation errors.    Loney Hering, MD 08/27/17 [phone]

## 2017-08-27 NOTE — Discharge Instructions (Addendum)
Please follow up with your surgeon regarding pain control

## 2017-08-31 LAB — CULTURE, BLOOD (ROUTINE X 2)
Culture: NO GROWTH
Special Requests: ADEQUATE

## 2017-09-01 ENCOUNTER — Other Ambulatory Visit: Payer: Self-pay | Admitting: Family Medicine

## 2017-09-01 DIAGNOSIS — I10 Essential (primary) hypertension: Secondary | ICD-10-CM

## 2017-09-01 LAB — CULTURE, BLOOD (ROUTINE X 2)
CULTURE: NO GROWTH
SPECIAL REQUESTS: ADEQUATE

## 2017-09-01 NOTE — Telephone Encounter (Signed)
He needs to be seen

## 2017-09-04 NOTE — Telephone Encounter (Signed)
Tried calling pt, bad connection. No answer on second call.

## 2017-09-12 ENCOUNTER — Other Ambulatory Visit: Payer: Self-pay

## 2017-09-12 DIAGNOSIS — I10 Essential (primary) hypertension: Secondary | ICD-10-CM

## 2017-09-12 NOTE — Telephone Encounter (Signed)
Left voice message on [phone] asking that he he return call to schedule appt  We had received a med refill request from pharmacy.

## 2017-09-12 NOTE — Telephone Encounter (Signed)
Refill request for general medication. Lisinopril to [facility]   Last office visit: 03/09/2017   Follow up on-not scheduled.  Please schedule follow up

## 2017-09-18 DIAGNOSIS — L718 Other rosacea: Secondary | ICD-10-CM | POA: Diagnosis not present

## 2017-09-18 DIAGNOSIS — Z85828 Personal history of other malignant neoplasm of skin: Secondary | ICD-10-CM | POA: Diagnosis not present

## 2017-09-18 DIAGNOSIS — Z08 Encounter for follow-up examination after completed treatment for malignant neoplasm: Secondary | ICD-10-CM | POA: Diagnosis not present

## 2017-09-18 DIAGNOSIS — D225 Melanocytic nevi of trunk: Secondary | ICD-10-CM | POA: Diagnosis not present

## 2017-09-18 DIAGNOSIS — Z85820 Personal history of malignant melanoma of skin: Secondary | ICD-10-CM | POA: Diagnosis not present

## 2017-09-19 DIAGNOSIS — M25611 Stiffness of right shoulder, not elsewhere classified: Secondary | ICD-10-CM | POA: Diagnosis not present

## 2017-09-19 DIAGNOSIS — M6281 Muscle weakness (generalized): Secondary | ICD-10-CM | POA: Diagnosis not present

## 2017-09-19 DIAGNOSIS — M75111 Incomplete rotator cuff tear or rupture of right shoulder, not specified as traumatic: Secondary | ICD-10-CM | POA: Diagnosis not present

## 2017-09-19 DIAGNOSIS — M25511 Pain in right shoulder: Secondary | ICD-10-CM | POA: Diagnosis not present

## 2017-09-21 DIAGNOSIS — Z23 Encounter for immunization: Secondary | ICD-10-CM | POA: Diagnosis not present

## 2017-09-21 DIAGNOSIS — E7849 Other hyperlipidemia: Secondary | ICD-10-CM | POA: Diagnosis not present

## 2017-09-21 DIAGNOSIS — F419 Anxiety disorder, unspecified: Secondary | ICD-10-CM | POA: Diagnosis not present

## 2017-09-21 DIAGNOSIS — M129 Arthropathy, unspecified: Secondary | ICD-10-CM | POA: Diagnosis not present

## 2017-09-21 DIAGNOSIS — I10 Essential (primary) hypertension: Secondary | ICD-10-CM | POA: Diagnosis not present

## 2017-09-21 DIAGNOSIS — E7800 Pure hypercholesterolemia, unspecified: Secondary | ICD-10-CM | POA: Diagnosis not present

## 2017-09-26 DIAGNOSIS — M75111 Incomplete rotator cuff tear or rupture of right shoulder, not specified as traumatic: Secondary | ICD-10-CM | POA: Diagnosis not present

## 2017-10-02 DIAGNOSIS — M75111 Incomplete rotator cuff tear or rupture of right shoulder, not specified as traumatic: Secondary | ICD-10-CM | POA: Diagnosis not present

## 2017-10-03 NOTE — Telephone Encounter (Signed)
LVM for pt to call the office and schedule an appt

## 2017-10-04 DIAGNOSIS — M75111 Incomplete rotator cuff tear or rupture of right shoulder, not specified as traumatic: Secondary | ICD-10-CM | POA: Diagnosis not present

## 2017-10-04 DIAGNOSIS — H9392 Unspecified disorder of left ear: Secondary | ICD-10-CM | POA: Diagnosis not present

## 2017-10-04 DIAGNOSIS — Z1389 Encounter for screening for other disorder: Secondary | ICD-10-CM | POA: Diagnosis not present

## 2017-10-04 DIAGNOSIS — E7849 Other hyperlipidemia: Secondary | ICD-10-CM | POA: Diagnosis not present

## 2017-10-04 DIAGNOSIS — H6123 Impacted cerumen, bilateral: Secondary | ICD-10-CM | POA: Diagnosis not present

## 2017-10-04 DIAGNOSIS — I517 Cardiomegaly: Secondary | ICD-10-CM | POA: Diagnosis not present

## 2017-10-04 DIAGNOSIS — R079 Chest pain, unspecified: Secondary | ICD-10-CM | POA: Diagnosis not present

## 2017-11-03 DIAGNOSIS — M129 Arthropathy, unspecified: Secondary | ICD-10-CM | POA: Diagnosis not present

## 2017-11-03 DIAGNOSIS — F329 Major depressive disorder, single episode, unspecified: Secondary | ICD-10-CM | POA: Diagnosis not present

## 2017-11-03 DIAGNOSIS — F419 Anxiety disorder, unspecified: Secondary | ICD-10-CM | POA: Diagnosis not present

## 2017-11-03 DIAGNOSIS — I10 Essential (primary) hypertension: Secondary | ICD-10-CM | POA: Diagnosis not present

## 2017-11-03 DIAGNOSIS — J449 Chronic obstructive pulmonary disease, unspecified: Secondary | ICD-10-CM | POA: Diagnosis not present

## 2018-03-07 ENCOUNTER — Ambulatory Visit: Payer: Medicare Other | Admitting: Family Medicine

## 2018-03-12 DIAGNOSIS — L719 Rosacea, unspecified: Secondary | ICD-10-CM | POA: Diagnosis not present

## 2018-03-12 DIAGNOSIS — Z125 Encounter for screening for malignant neoplasm of prostate: Secondary | ICD-10-CM | POA: Diagnosis not present

## 2018-03-12 DIAGNOSIS — I10 Essential (primary) hypertension: Secondary | ICD-10-CM | POA: Diagnosis not present

## 2018-03-12 DIAGNOSIS — E7800 Pure hypercholesterolemia, unspecified: Secondary | ICD-10-CM | POA: Diagnosis not present

## 2018-03-12 DIAGNOSIS — F325 Major depressive disorder, single episode, in full remission: Secondary | ICD-10-CM | POA: Diagnosis not present

## 2018-03-12 DIAGNOSIS — Z79899 Other long term (current) drug therapy: Secondary | ICD-10-CM | POA: Diagnosis not present

## 2018-03-12 DIAGNOSIS — J449 Chronic obstructive pulmonary disease, unspecified: Secondary | ICD-10-CM | POA: Diagnosis not present

## 2018-03-12 DIAGNOSIS — R739 Hyperglycemia, unspecified: Secondary | ICD-10-CM | POA: Diagnosis not present

## 2018-06-07 ENCOUNTER — Ambulatory Visit: Payer: Self-pay | Admitting: Family Medicine

## 2018-06-07 ENCOUNTER — Telehealth: Payer: Self-pay | Admitting: Family Medicine

## 2018-06-07 NOTE — Chronic Care Management (AMB) (Signed)
Chronic Care Management   Note  06/07/2018 Name: [name] Browning MRN: [id number] DOB: July 03, 1940  [name] Browning is a 78 y.o. year old male who is a primary care patient of Steele Sizer, MD. I reached out to [name] by phone today in response to a referral sent by [name]'s health plan.    [name] was given information about Chronic Care Management services today including:  1. CCM service includes personalized support from designated clinical staff supervised by his physician, including individualized plan of care and coordination with other care providers 2. 24/7 contact phone numbers for assistance for urgent and routine care needs. 3. Service will only be billed when office clinical staff spend 20 minutes or more in a month to coordinate care. 4. Only one practitioner may furnish and bill the service in a calendar month. 5. The patient may stop CCM services at any time (effective at the end of the month) by phone call to the office staff. 6. The patient will be responsible for cost sharing (co-pay) of up to 20% of the service fee (after annual deductible is met).  Patient did not agree to services and does not wish to consider at this time.  Follow up plan: The care management team is available to follow up with the patient after provider conversation with the patient regarding recommendation for care management engagement and subsequent re-referral to the care management team.   Leonard  [email or web address]   ??[phone]

## 2018-06-07 NOTE — Telephone Encounter (Signed)
@  [facility] Chronic Care Management   Outreach Note  06/07/2018 Name: [name] Browning MRN: [id number] DOB: Mar 03, 1940  Referred by: Steele Sizer, MD Reason for referral : Chronic Care Management (Initial CCM outreach call)   An unsuccessful telephone outreach was attempted today. The patient was referred to the case management team by for assistance with chronic care management and care coordination.   Follow Up Plan: A HIPPA compliant phone message was left for the patient providing contact information and requesting a return call.  The CM team will reach out to the patient again over the next 7 days.  If patient returns call to provider office, please advise to call Sarita at [place]  [email or web address]   ??[phone]

## 2018-08-10 DIAGNOSIS — L719 Rosacea, unspecified: Secondary | ICD-10-CM | POA: Diagnosis not present

## 2018-08-10 DIAGNOSIS — L814 Other melanin hyperpigmentation: Secondary | ICD-10-CM | POA: Diagnosis not present

## 2018-08-10 DIAGNOSIS — L578 Other skin changes due to chronic exposure to nonionizing radiation: Secondary | ICD-10-CM | POA: Diagnosis not present

## 2018-08-10 DIAGNOSIS — L570 Actinic keratosis: Secondary | ICD-10-CM | POA: Diagnosis not present

## 2018-08-10 DIAGNOSIS — D485 Neoplasm of uncertain behavior of skin: Secondary | ICD-10-CM | POA: Diagnosis not present

## 2018-08-10 DIAGNOSIS — D692 Other nonthrombocytopenic purpura: Secondary | ICD-10-CM | POA: Diagnosis not present

## 2018-08-10 DIAGNOSIS — L821 Other seborrheic keratosis: Secondary | ICD-10-CM | POA: Diagnosis not present

## 2018-08-10 DIAGNOSIS — L820 Inflamed seborrheic keratosis: Secondary | ICD-10-CM | POA: Diagnosis not present

## 2018-09-07 DIAGNOSIS — E7800 Pure hypercholesterolemia, unspecified: Secondary | ICD-10-CM | POA: Diagnosis not present

## 2018-09-07 DIAGNOSIS — R739 Hyperglycemia, unspecified: Secondary | ICD-10-CM | POA: Diagnosis not present

## 2018-09-07 DIAGNOSIS — Z79899 Other long term (current) drug therapy: Secondary | ICD-10-CM | POA: Diagnosis not present

## 2018-09-07 DIAGNOSIS — Z125 Encounter for screening for malignant neoplasm of prostate: Secondary | ICD-10-CM | POA: Diagnosis not present

## 2018-09-14 DIAGNOSIS — Z0000 Encounter for general adult medical examination without abnormal findings: Secondary | ICD-10-CM | POA: Diagnosis not present

## 2018-09-14 DIAGNOSIS — J449 Chronic obstructive pulmonary disease, unspecified: Secondary | ICD-10-CM | POA: Insufficient documentation

## 2018-11-26 DIAGNOSIS — K429 Umbilical hernia without obstruction or gangrene: Secondary | ICD-10-CM | POA: Diagnosis not present

## 2018-11-26 DIAGNOSIS — M6208 Separation of muscle (nontraumatic), other site: Secondary | ICD-10-CM | POA: Diagnosis not present

## 2018-11-26 DIAGNOSIS — Z87891 Personal history of nicotine dependence: Secondary | ICD-10-CM | POA: Diagnosis not present

## 2019-01-11 DIAGNOSIS — I4891 Unspecified atrial fibrillation: Secondary | ICD-10-CM

## 2019-01-11 HISTORY — DX: Unspecified atrial fibrillation: I48.91

## 2019-01-31 ENCOUNTER — Ambulatory Visit: Payer: Medicare HMO | Attending: Internal Medicine

## 2019-01-31 DIAGNOSIS — Z20822 Contact with and (suspected) exposure to covid-19: Secondary | ICD-10-CM

## 2019-02-01 LAB — NOVEL CORONAVIRUS, NAA: SARS-CoV-2, NAA: NOT DETECTED

## 2019-02-08 ENCOUNTER — Emergency Department: Payer: Medicare HMO

## 2019-02-08 ENCOUNTER — Inpatient Hospital Stay
Admission: EM | Admit: 2019-02-08 | Discharge: 2019-02-11 | DRG: 177 | Disposition: A | Discharge status: Home / Self Care | Payer: Medicare HMO | Attending: Hospitalist | Admitting: Hospitalist

## 2019-02-08 ENCOUNTER — Encounter: Payer: Self-pay | Admitting: Emergency Medicine

## 2019-02-08 ENCOUNTER — Other Ambulatory Visit: Payer: Self-pay

## 2019-02-08 DIAGNOSIS — Z7982 Long term (current) use of aspirin: Secondary | ICD-10-CM

## 2019-02-08 DIAGNOSIS — Z885 Allergy status to narcotic agent status: Secondary | ICD-10-CM | POA: Diagnosis not present

## 2019-02-08 DIAGNOSIS — E785 Hyperlipidemia, unspecified: Secondary | ICD-10-CM | POA: Diagnosis present

## 2019-02-08 DIAGNOSIS — Z8673 Personal history of transient ischemic attack (TIA), and cerebral infarction without residual deficits: Secondary | ICD-10-CM | POA: Diagnosis not present

## 2019-02-08 DIAGNOSIS — Z86718 Personal history of other venous thrombosis and embolism: Secondary | ICD-10-CM | POA: Diagnosis not present

## 2019-02-08 DIAGNOSIS — I248 Other forms of acute ischemic heart disease: Secondary | ICD-10-CM | POA: Diagnosis not present

## 2019-02-08 DIAGNOSIS — J440 Chronic obstructive pulmonary disease with acute lower respiratory infection: Secondary | ICD-10-CM | POA: Diagnosis present

## 2019-02-08 DIAGNOSIS — Z85828 Personal history of other malignant neoplasm of skin: Secondary | ICD-10-CM | POA: Diagnosis not present

## 2019-02-08 DIAGNOSIS — Z7951 Long term (current) use of inhaled steroids: Secondary | ICD-10-CM

## 2019-02-08 DIAGNOSIS — Z8249 Family history of ischemic heart disease and other diseases of the circulatory system: Secondary | ICD-10-CM | POA: Diagnosis not present

## 2019-02-08 DIAGNOSIS — Z8616 Personal history of COVID-19: Secondary | ICD-10-CM

## 2019-02-08 DIAGNOSIS — J1282 Pneumonia due to coronavirus disease 2019: Secondary | ICD-10-CM | POA: Diagnosis present

## 2019-02-08 DIAGNOSIS — I480 Paroxysmal atrial fibrillation: Secondary | ICD-10-CM | POA: Diagnosis not present

## 2019-02-08 DIAGNOSIS — R0602 Shortness of breath: Secondary | ICD-10-CM | POA: Diagnosis not present

## 2019-02-08 DIAGNOSIS — U071 COVID-19: Secondary | ICD-10-CM | POA: Diagnosis not present

## 2019-02-08 DIAGNOSIS — I4891 Unspecified atrial fibrillation: Secondary | ICD-10-CM | POA: Diagnosis not present

## 2019-02-08 DIAGNOSIS — Z87891 Personal history of nicotine dependence: Secondary | ICD-10-CM | POA: Diagnosis not present

## 2019-02-08 DIAGNOSIS — I10 Essential (primary) hypertension: Secondary | ICD-10-CM | POA: Diagnosis present

## 2019-02-08 DIAGNOSIS — Z79899 Other long term (current) drug therapy: Secondary | ICD-10-CM | POA: Diagnosis not present

## 2019-02-08 HISTORY — DX: Personal history of COVID-19: Z86.16

## 2019-02-08 HISTORY — DX: COVID-19: U07.1

## 2019-02-08 LAB — HEPATIC FUNCTION PANEL
ALT: 23 U/L (ref 0–44)
AST: 34 U/L (ref 15–41)
Albumin: 4 g/dL (ref 3.5–5.0)
Alkaline Phosphatase: 76 U/L (ref 38–126)
Bilirubin, Direct: 0.1 mg/dL (ref 0.0–0.2)
Indirect Bilirubin: 1 mg/dL — ABNORMAL HIGH (ref 0.3–0.9)
Total Bilirubin: 1.1 mg/dL (ref 0.3–1.2)
Total Protein: 7.8 g/dL (ref 6.5–8.1)

## 2019-02-08 LAB — TROPONIN I (HIGH SENSITIVITY): Troponin I (High Sensitivity): 24 ng/L — ABNORMAL HIGH (ref <18)

## 2019-02-08 LAB — GLUCOSE, CAPILLARY: Glucose-Capillary: 105 mg/dL — ABNORMAL HIGH (ref 70–99)

## 2019-02-08 LAB — BASIC METABOLIC PANEL
Anion gap: 9 (ref 5–15)
BUN: 20 mg/dL (ref 8–23)
CO2: 27 mmol/L (ref 22–32)
Calcium: 8.8 mg/dL — ABNORMAL LOW (ref 8.9–10.3)
Chloride: 101 mmol/L (ref 98–111)
Creatinine, Ser: 1.24 mg/dL (ref 0.61–1.24)
GFR calc Af Amer: >60 mL/min (ref >60)
GFR calc non Af Amer: 56 mL/min — ABNORMAL LOW (ref >60)
Glucose, Bld: 120 mg/dL — ABNORMAL HIGH (ref 70–99)
Potassium: 3.6 mmol/L (ref 3.5–5.1)
Sodium: 137 mmol/L (ref 135–145)

## 2019-02-08 LAB — CBC
HCT: 47 % (ref 39.0–52.0)
Hemoglobin: 15.4 g/dL (ref 13.0–17.0)
MCH: 30 pg (ref 26.0–34.0)
MCHC: 32.8 g/dL (ref 30.0–36.0)
MCV: 91.6 fL (ref 80.0–100.0)
Platelets: 144 10*3/uL — ABNORMAL LOW (ref 150–400)
RBC: 5.13 MIL/uL (ref 4.22–5.81)
RDW: 12.9 % (ref 11.5–15.5)
WBC: 3.6 10*3/uL — ABNORMAL LOW (ref 4.0–10.5)
nRBC: 0 % (ref 0.0–0.2)

## 2019-02-08 LAB — LACTATE DEHYDROGENASE: LDH: 185 U/L (ref 98–192)

## 2019-02-08 LAB — POC SARS CORONAVIRUS 2 AG: SARS Coronavirus 2 Ag: POSITIVE — AB

## 2019-02-08 LAB — FERRITIN: Ferritin: 833 ng/mL — ABNORMAL HIGH (ref 24–336)

## 2019-02-08 MED ORDER — DEXAMETHASONE SODIUM PHOSPHATE 10 MG/ML IJ SOLN
8.0000 mg | Freq: Once | INTRAMUSCULAR | Status: AC
  Administered 2019-02-08: 8 mg via INTRAVENOUS
  Filled 2019-02-08: qty 1

## 2019-02-08 MED ORDER — HEPARIN (PORCINE) 25000 UT/250ML-% IV SOLN
1100.0000 [IU]/h | INTRAVENOUS | Status: DC
  Administered 2019-02-09: 1100 [IU]/h via INTRAVENOUS
  Filled 2019-02-08: qty 250

## 2019-02-08 MED ORDER — SODIUM CHLORIDE 0.9 % IV BOLUS
500.0000 mL | Freq: Once | INTRAVENOUS | Status: AC
  Administered 2019-02-08: 500 mL via INTRAVENOUS

## 2019-02-08 MED ORDER — ASPIRIN 81 MG PO CHEW
324.0000 mg | CHEWABLE_TABLET | Freq: Once | ORAL | Status: AC
  Administered 2019-02-08: 324 mg via ORAL
  Filled 2019-02-08: qty 4

## 2019-02-08 MED ORDER — HEPARIN BOLUS VIA INFUSION
4000.0000 [IU] | Freq: Once | INTRAVENOUS | Status: AC
  Administered 2019-02-09: 4000 [IU] via INTRAVENOUS
  Filled 2019-02-08: qty 4000

## 2019-02-08 MED ORDER — SODIUM CHLORIDE 0.9 % IV SOLN
100.0000 mg | Freq: Every day | INTRAVENOUS | Status: DC
  Filled 2019-02-08: qty 20

## 2019-02-08 MED ORDER — SODIUM CHLORIDE 0.9 % IV SOLN
200.0000 mg | Freq: Once | INTRAVENOUS | Status: AC
  Administered 2019-02-09: 200 mg via INTRAVENOUS
  Filled 2019-02-08: qty 200

## 2019-02-08 NOTE — Progress Notes (Signed)
ANTICOAGULATION CONSULT NOTE - Initial Consult  Pharmacy Consult for heparin Indication: atrial fibrillation  No Known Allergies  Patient Measurements: Height: 6\' 1"  (185.4 cm) Weight: 187 lb (84.8 kg) IBW/kg (Calculated) : 79.9 Heparin Dosing Weight: 84.8 kg  Vital Signs: Temp: 98.1 F (36.7 C) (01/29 1740) Temp Source: Oral (01/29 1740) BP: 129/67 (01/29 2030) Pulse Rate: 96 (01/29 2200)  Labs: Recent Labs    02/08/19 1805 02/08/19 1945  HGB 15.4  --   HCT 47.0  --   PLT 144*  --   CREATININE 1.24  --   TROPONINIHS  --  24*    Estimated Creatinine Clearance: 56.4 mL/min (by C-G formula based on SCr of 1.24 mg/dL).   Medical History: Past Medical History:  Diagnosis Date  . Anxiety   . Cancer ([place])    skin cancer on right ear and right forehead removed  . Carpal tunnel syndrome    Both wrists.  . Carpal tunnel syndrome   . COPD (chronic obstructive pulmonary disease) ([place])   . DVT (deep venous thrombosis) ([place])    taken off anticoagulation after about 6 months  . HLD (hyperlipidemia)   . Hypertension   . Kidney stones   . Leaky heart valve   . Normal coronary arteries    a. by cardiac cath in 2004; b. normal stress test in 2007 and 2011; c. Lexiscan 05/20/2014: no significant ischemia, EF 55-65%, no EKG changes, low risk study   . Pneumonia   . Stroke [facility])    TIA in 2016  . Syncope     Medications:  Scheduled:  . heparin  4,000 Units Intravenous Once    Assessment: Patient arrives w/ SOB and cough w/ h/o COPD found to have possible infx process underlying chronic lung dx w/ COVID + result. Patient also has an initial trop of 84 w/ elevated ferritin of 348, EKG showing afib w/ RVR (patient is in new onset afib w/ RVR). No anticoagulants PTA. Baseline H/h slightly elevated from pt's baseline of 13/38 w/ plts being lower than baseline as well. Patient is being started on heparin drip for management of new onset afib w/ CHADS-VASc = 4 (HTN, age > 21, h/o  stroke).  Goal of Therapy:  Heparin level 0.3-0.7 units/ml Monitor platelets by anticoagulation protocol: Yes   Plan:  Will bolus w/ heparin 4000 units IV x 1 Will start rate at 1100 units/hr Will check anti-Xa at 0700 Will monitor daily CBC's and adjust per anti-Xa levels.  Tobie Lords, PharmD, BCPS Clinical Pharmacist 02/08/2019,11:25 PM

## 2019-02-08 NOTE — ED Triage Notes (Signed)
Pt to ER with c/o cough, SHOB, body aches, and dizziness. Pt states vomited x 1 first thing this AM and has had decreased PO intake last several days.

## 2019-02-08 NOTE — ED Triage Notes (Signed)
FIRST NURSE NOTE- here for cough and some SHOB. Unlabored currently. sats 94%RA

## 2019-02-08 NOTE — ED Notes (Signed)
Pt ambulated around room at this time. Pt o2 sats maintained at 95% on room air. Pt complaining of nausea and weakness with ambulation.

## 2019-02-08 NOTE — ED Provider Notes (Signed)
[facility] Emergency Department Provider Note  ____________________________________________   First MD Initiated Contact with Patient 02/08/19 1822     (approximate)  I have reviewed the triage vital signs and the nursing notes.  History  Chief Complaint Shortness of Breath and Dizziness    HPI [name] Browning is a 79 y.o. male with hx of COPD (not on oxygen), HLD, HTN who presents to the emergency department for shortness of breath, nonproductive cough, fatigue, body aches, nausea with emesis x1, dizziness.  He reports multiple positive COVID exposures.  Reports symptoms have been present for several days, progressively worsening, with no alleviating, aggravating components. No radiation. Has not tried anything for his symptoms. No known hx of atrial fibrillation, but has been told he had an "irregular heart beat" at one point.    Past Medical Hx Past Medical History:  Diagnosis Date  . Anxiety   . Cancer ([place])    skin cancer on right ear and right forehead removed  . Carpal tunnel syndrome    Both wrists.  . Carpal tunnel syndrome   . COPD (chronic obstructive pulmonary disease) ([place])   . DVT (deep venous thrombosis) ([place])    taken off anticoagulation after about 6 months  . HLD (hyperlipidemia)   . Hypertension   . Kidney stones   . Leaky heart valve   . Normal coronary arteries    a. by cardiac cath in 2004; b. normal stress test in 2007 and 2011; c. Lexiscan 05/20/2014: no significant ischemia, EF 55-65%, no EKG changes, low risk study   . Pneumonia   . Stroke [facility])    TIA in 2016  . Syncope     Problem List Patient Active Problem List   Diagnosis Date Noted  . Chronic neck pain 03/09/2017  . Degenerative disc disease, cervical 03/09/2017  . Perennial allergic rhinitis 03/09/2017  . Hyperglycemia 09/01/2015  . Elevated rheumatoid factor 04/08/2015  . Arthritis of both hands 04/01/2015  . Bilateral hand pain 03/18/2015  . Elevated  alkaline phosphatase level 03/02/2015  . Anxiety 02/16/2015  . Intermittent chest pain 02/16/2015  . History of TIA (transient ischemic attack) 05/18/2014  . HTN (hypertension) 05/18/2014  . Hyperlipidemia 05/18/2014  . Status post carpal tunnel release 06/26/2013    Past Surgical Hx Past Surgical History:  Procedure Laterality Date  . CARDIAC CATHETERIZATION  2004  . CARPAL TUNNEL RELEASE Right 06/11/2013  . SHOULDER ARTHROSCOPY WITH OPEN ROTATOR CUFF REPAIR Right 08/24/2017   Procedure: SHOULDER ARTHROSCOPY WITH OPEN ROTATOR CUFF REPAIR;  Surgeon: Corky Mull, MD;  Location: [facility] ORS;  Service: Orthopedics;  Laterality: Right;  . SKIN CANCER EXCISION  12/01/2016   right ear   . SKIN CANCER EXCISION     remove from the right side of the face   . THROMBECTOMY Right 2004   leg  . THYROIDECTOMY  1950   Not sure if total or partial thyroidectomy.    Medications Prior to Admission medications   Medication Sig Start Date End Date Taking? Authorizing Provider  ALPRAZolam Duanne Moron) 0.5 MG tablet Take 0.5 tablets (0.25 mg total) by mouth 2 (two) times daily as needed for anxiety. Wean self off, try half twice daily for one week , after that half once a day for one week, after that half every other day and stop 03/09/17   Steele Sizer, MD  aspirin EC 81 MG tablet Take 81 mg by mouth daily as needed.     [provider]  lisinopril (PRINIVIL,ZESTRIL) 20 MG tablet TAKE 1 TABLET BY MOUTH EVERY DAY 11/24/17   Ancil Boozer, Drue Stager, MD  metroNIDAZOLE (METROGEL) 0.75 % gel APPLY TO AFFECTED FACE TWICE DAILY 02/08/17   [provider]  oxyCODONE (ROXICODONE) 5 MG immediate release tablet Take 1-2 tablets (5-10 mg total) by mouth every 4 (four) hours as needed. 08/24/17   Poggi, Marshall Cork, MD  rosuvastatin (CRESTOR) 40 MG tablet Take 1 tablet (40 mg total) by mouth daily. 03/09/17   Steele Sizer, MD    Allergies Patient has no known allergies.  Family Hx Family History  Problem  Relation Age of Onset  . Hypertension Mother   . Heart disease Mother   . CAD Father   . Heart attack Father     Social Hx Social History   Tobacco Use  . Smoking status: Former Smoker    Packs/day: 1.00    Years: 46.00    Pack years: 46.00    Types: Cigarettes    Start date: 01/10/1957    Quit date: 02/11/2003    Years since quitting: 16.0  . Smokeless tobacco: Former Systems developer    Types: Snuff    Quit date: 02/11/2003  Substance Use Topics  . Alcohol use: Yes    Alcohol/week: 7.0 standard drinks    Types: 7 Cans of beer per week  . Drug use: No     Review of Systems  Constitutional: + fever, chills, fatigue, body aches Eyes: Negative for visual changes. ENT: Negative for sore throat. Cardiovascular: Negative for chest pain. Respiratory: + for shortness of breath. Gastrointestinal: Negative for nausea, vomiting.  Genitourinary: Negative for dysuria. Musculoskeletal: Negative for leg swelling. Skin: Negative for rash. Neurological: Negative for headaches.   Physical Exam  Vital Signs: ED Triage Vitals  Enc Vitals Group     BP 02/08/19 1740 (!) 106/57     Pulse Rate 02/08/19 1740 82     Resp 02/08/19 1740 18     Temp 02/08/19 1740 98.1 F (36.7 C)     Temp Source 02/08/19 1740 Oral     SpO2 02/08/19 1740 94 %     Weight 02/08/19 1741 187 lb (84.8 kg)     Height 02/08/19 1741 6\' 1"  (1.854 m)     Head Circumference --      Peak Flow --      Pain Score 02/08/19 1740 6     Pain Loc --      Pain Edu? --      Excl. in [place]? --     Constitutional: Alert and oriented. Appears fatigued.  Head: Normocephalic. Atraumatic. Eyes: Conjunctivae clear. Sclera anicteric. Nose: No congestion. No rhinorrhea. Mouth/Throat: Wearing mask.  Neck: No stridor.   Cardiovascular: HR within normal limits, irregular. Extremities well perfused. Respiratory: Normal respiratory effort.  Lungs CTAB. Gastrointestinal: Soft. Non-tender. Non-distended.  Musculoskeletal: No lower extremity  edema. No deformities. Neurologic:  Normal speech and language. No gross focal neurologic deficits are appreciated.  Skin: Skin is warm, dry and intact. No rash noted. Psychiatric: Mood and affect are appropriate for situation.  EKG  Personally reviewed.   Rate: 100s Rhythm: irregular, AF Axis: LAD Intervals: WNL Atrial fibrillation, new compared to prior Significant artifact/wandering baseline No STEMI  Repeat EKG Rate: 90s Rhythm: irregular, AF Axis: LAD No acute ischemic changes No STEMI  Radiology  CXR: IMPRESSION:  1. Hyperinflation with emphysematous disease  2. Diffusely increased interstitial opacity bilaterally suspect for  acute interstitial process on underlying chronic disease, consider  atypical infection    Procedures  Procedure(s) performed (including critical care):  Procedures   Initial Impression / Assessment and Plan / ED Course  79 y.o. male who presents to the ED for SOB, cough, body aches, multiple COVID exposures.   Ddx: high suspicion COVID vs COPD exacerbation vs other pulmonary infection vs new onset/symptomatic AF vs atypical ACS  Will obtain labs, COVID testing, imaging, reassess  EKG reveals new onset AF, rate within normal limits. COVID positive. HS troponin mildly elevated, no prior for comparison. Able to ambulate w/o desaturation but notably symptomatic and weak/nausous. Given his COVID + status, new onset AF, and mildly elevated trop, will plan for admission. Discussed w/ hospitalist for admission.   Final Clinical Impression(s) / ED Diagnosis  Final diagnoses:  Shortness of breath  COVID-19  New onset atrial fibrillation [facility])       Note:  This document was prepared using Dragon voice recognition software and may include unintentional dictation errors.   Lilia Pro., MD 02/09/19 [phone]

## 2019-02-08 NOTE — H&P (Addendum)
History and Physical    [name] Browning [id number] DOB: 13-Nov-1940 DOA: 02/08/2019  PCP: Steele Sizer, MD  Patient coming from: home   Chief Complaint: cough, fatigue  HPI: [name] Browning is a 79 y.o. male with medical history significant for presbycusis, tia, htn, history dvt not currently anticoagulated, possible copd, presenting above.  Lives with several people who have been diagnosed with covid.  Says symptoms began approximately 1/15 with just mild headache. Over the last 3 days or so has developed change in appetite and taste, muscle aches, mild cough, and yesterday had loose stools. Tolerating PO, no vomiting. No SOB or DOE. Denies fevers. Denies chest pain but does endorse occasional palpitations today.   Normal heart cath 2004.  Pt unsure of circumstances of prior DVT.  ED Course: labs, decadron  Review of Systems: As per HPI otherwise 10 point review of systems negative.    Past Medical History:  Diagnosis Date  . Anxiety   . Cancer ([place])    skin cancer on right ear and right forehead removed  . Carpal tunnel syndrome    Both wrists.  . Carpal tunnel syndrome   . COPD (chronic obstructive pulmonary disease) ([place])   . DVT (deep venous thrombosis) ([place])    taken off anticoagulation after about 6 months  . HLD (hyperlipidemia)   . Hypertension   . Kidney stones   . Leaky heart valve   . Normal coronary arteries    a. by cardiac cath in 2004; b. normal stress test in 2007 and 2011; c. Lexiscan 05/20/2014: no significant ischemia, EF 55-65%, no EKG changes, low risk study   . Pneumonia   . Stroke [facility])    TIA in 2016  . Syncope     Past Surgical History:  Procedure Laterality Date  . CARDIAC CATHETERIZATION  2004  . CARPAL TUNNEL RELEASE Right 06/11/2013  . SHOULDER ARTHROSCOPY WITH OPEN ROTATOR CUFF REPAIR Right 08/24/2017   Procedure: SHOULDER ARTHROSCOPY WITH OPEN ROTATOR CUFF REPAIR;  Surgeon: Corky Mull, MD;  Location: [facility] ORS;  Service:  Orthopedics;  Laterality: Right;  . SKIN CANCER EXCISION  12/01/2016   right ear   . SKIN CANCER EXCISION     remove from the right side of the face   . THROMBECTOMY Right 2004   leg  . THYROIDECTOMY  1950   Not sure if total or partial thyroidectomy.     reports that he quit smoking about 16 years ago. His smoking use included cigarettes. He started smoking about 62 years ago. He has a 46.00 pack-year smoking history. He quit smokeless tobacco use about 16 years ago.  His smokeless tobacco use included snuff. He reports current alcohol use of about 7.0 standard drinks of alcohol per week. He reports that he does not use drugs.  No Known Allergies  Family History  Problem Relation Age of Onset  . Hypertension Mother   . Heart disease Mother   . CAD Father   . Heart attack Father     Prior to Admission medications   Medication Sig Start Date End Date Taking? Authorizing Provider  aspirin EC 81 MG tablet Take 81 mg by mouth daily as needed.    Yes [provider]  cetirizine (ZYRTEC) 10 MG tablet Take 10 mg by mouth daily. 10/10/18  Yes [provider]  Cholecalciferol 125 MCG (5000 UT) capsule Take 1 capsule by mouth daily.   Yes [provider]  Ferrous Gluconate (IRON) 240 (27 Fe)  MG TABS Take 1 tablet by mouth daily. 09/21/18  Yes [provider]  lisinopril (PRINIVIL,ZESTRIL) 20 MG tablet TAKE 1 TABLET BY MOUTH EVERY DAY 11/24/17  Yes Sowles, Drue Stager, MD  metroNIDAZOLE (METROGEL) 1 % gel Apply 1 application topically 2 (two) times daily. Apply topically 1 to 2 times a day 01/12/19  Yes [provider]  pantoprazole (PROTONIX) 40 MG tablet Take 40 mg by mouth daily. 12/10/18  Yes [provider]  rosuvastatin (CRESTOR) 40 MG tablet Take 1 tablet (40 mg total) by mouth daily. 03/09/17  Yes Sowles, Drue Stager, MD  SYMBICORT 160-4.5 MCG/ACT inhaler Inhale 2 puffs into the lungs daily. 12/07/18  Yes [provider]  ALPRAZolam  Duanne Moron) 0.5 MG tablet Take 0.5 tablets (0.25 mg total) by mouth 2 (two) times daily as needed for anxiety. Wean self off, try half twice daily for one week , after that half once a day for one week, after that half every other day and stop Patient not taking: Reported on 02/08/2019 03/09/17   Steele Sizer, MD  doxycycline (VIBRA-TABS) 100 MG tablet Take 100 mg by mouth daily. 11/13/18   [provider]  oxyCODONE (ROXICODONE) 5 MG immediate release tablet Take 1-2 tablets (5-10 mg total) by mouth every 4 (four) hours as needed. Patient not taking: Reported on 02/08/2019 08/24/17   Corky Mull, MD    Physical Exam: Vitals:   02/08/19 2000 02/08/19 2015 02/08/19 2030 02/08/19 2200  BP: 118/65  129/67   Pulse: 82 88 91 96  Resp: (!) 28 (!) 28 (!) 25 17  Temp:      TempSrc:      SpO2: 94% 96% 96% 94%  Weight:      Height:        Constitutional: No acute distress Head: Atraumatic Eyes: Conjunctiva clear ENM: Moist mucous membranes. Poor dentition.  Neck: Supple Respiratory: scattered rhonchi Cardiovascular: tachycardic, irregularly irregular Abdomen: Non-tender, mildly distended. No masses. No rebound or guarding. Positive bowel sounds. Musculoskeletal: No joint deformity upper and lower extremities. Normal ROM, no contractures. Decreased muscle tone.  Skin: No rashes, lesions, or ulcers.  Extremities: No peripheral edema. Palpable peripheral pulses. Neurologic: Alert, moving all 4 extremities. Hard of hearing Psychiatric: Normal insight and judgement.   Labs on Admission: I have personally reviewed following labs and imaging studies  CBC: Recent Labs  Lab 02/08/19 1805  WBC 3.6*  HGB 15.4  HCT 47.0  MCV 91.6  PLT 123456*   Basic Metabolic Panel: Recent Labs  Lab 02/08/19 1805  NA 137  K 3.6  CL 101  CO2 27  GLUCOSE 120*  BUN 20  CREATININE 1.24  CALCIUM 8.8*   GFR: Estimated Creatinine Clearance: 56.4 mL/min (by C-G formula based on SCr of 1.24 mg/dL).  Liver Function Tests: Recent Labs  Lab 02/08/19 1945  AST 34  ALT 23  ALKPHOS 76  BILITOT 1.1  PROT 7.8  ALBUMIN 4.0   No results for input(s): LIPASE, AMYLASE in the last 168 hours. No results for input(s): AMMONIA in the last 168 hours. Coagulation Profile: No results for input(s): INR, PROTIME in the last 168 hours. Cardiac Enzymes: No results for input(s): CKTOTAL, CKMB, CKMBINDEX, TROPONINI in the last 168 hours. BNP (last 3 results) No results for input(s): PROBNP in the last 8760 hours. HbA1C: No results for input(s): HGBA1C in the last 72 hours. CBG: Recent Labs  Lab 02/08/19 1759  GLUCAP 105*   Lipid Profile: No results for input(s): CHOL, HDL, LDLCALC,  TRIG, CHOLHDL, LDLDIRECT in the last 72 hours. Thyroid Function Tests: No results for input(s): TSH, T4TOTAL, FREET4, T3FREE, THYROIDAB in the last 72 hours. Anemia Panel: Recent Labs    02/08/19 1945  FERRITIN 833*   Urine analysis:    Component Value Date/Time   COLORURINE YELLOW (A) 08/27/2017 0449   APPEARANCEUR CLEAR (A) 08/27/2017 0449   APPEARANCEUR Cloudy 08/10/2013 1825   LABSPEC 1.018 08/27/2017 0449   LABSPEC 1.014 08/10/2013 1825   PHURINE 5.0 08/27/2017 0449   GLUCOSEU NEGATIVE 08/27/2017 0449   GLUCOSEU Negative 08/10/2013 1825   HGBUR NEGATIVE 08/27/2017 0449   BILIRUBINUR NEGATIVE 08/27/2017 0449   BILIRUBINUR 1+ 08/10/2013 1825   KETONESUR NEGATIVE 08/27/2017 0449   PROTEINUR NEGATIVE 08/27/2017 0449   NITRITE NEGATIVE 08/27/2017 0449   LEUKOCYTESUR NEGATIVE 08/27/2017 0449   LEUKOCYTESUR Negative 08/10/2013 1825    Radiological Exams on Admission: DG Chest Port 1 View  Result Date: 02/08/2019 CLINICAL DATA:  Shortness of breath EXAM: PORTABLE CHEST 1 VIEW COMPARISON:  08/27/2017 FINDINGS: Hyperinflated lungs with emphysematous disease. Diffusely increased interstitial opacity compared to prior. No consolidation. No pleural effusion or pneumothorax. Normal heart size. Aortic  atherosclerosis. Biapical pleural thickening IMPRESSION: 1. Hyperinflation with emphysematous disease 2. Diffusely increased interstitial opacity bilaterally suspect for acute interstitial process on underlying chronic disease, consider atypical infection Electronically Signed   By: Donavan Foil M.D.   On: 02/08/2019 19:07    EKG: Independently reviewed. A fib  Assessment/Plan Principal Problem:   COVID-19 virus infection Active Problems:   History of TIA (transient ischemic attack)   HTN (hypertension)   New onset atrial fibrillation (HCC)   # Covid infection - overall symptoms relatively mild. Normal WOB, satting normally on room air. CXR with diffuse vague opacities - possible brewing covid pneumonia. LFTs wnl. - decadron and remdesivir. - f/u inflammatory markers - tolerating by mouth; gentle fluids @ 75/hr given report of diarrhea and decreased PO  # Atrial fibrillation - rate controlled currrently. denies history of a fib (though poor historian); review of EKGs in our system doesn't reveal hx a fib. covid infection likely precipitating factor, though quite possible pt has had paroxysmal a fib for some time - predisposing factors include age, htn, and poss dx of copd. chads2vasc score is elevated to 5. Stroke risk higher than bleeding risk per aac calculator. Does have hx tia. - will start heparin overnight; deferred noac given need to discuss risks/benefits of long-term anticoag w/ patient, and also possibility of early cardioversion. Likely cardiology consult tomorrow. - start metop tartrate 25 bid - f/u tsh, a1c - tele - fluids as above  # elevated troponin - mildly elevated to 25. Denies ischemic symptoms. Likely demand 2/2 a fib and acute illness (covid). EKG w/o ischemic changes. Was given asa in the ED - repeat troponin ordered - telemetry - heparin as above - cont home statin  # htn - here bp normal, in setting of illness - hold home lisinopril for now - metoprolol as  above  DVT prophylaxis: therapeutic heparin Code Status: full  Family Communication: ex-wife  Disposition Plan: tbd  Consults called: none  Admission status: med/surg    Desma Maxim MD [facility] Pager [phone]  If 7PM-7AM, please contact night-coverage [email or web address] Password [facility]  02/08/2019, 11:08 PM

## 2019-02-08 NOTE — Progress Notes (Signed)
Remdesivir - Pharmacy Brief Note   O:  CXR: IMPRESSION: 1. Hyperinflation with emphysematous disease 2. Diffusely increased interstitial opacity bilaterally suspect for acute interstitial process on underlying chronic disease, consider atypical infection SpO2: 92 - 95% on RA   A/P:  Remdesivir 200 mg IVPB once followed by 100 mg IVPB daily x 4 days.   Tobie Lords, PharmD, BCPS Clinical Pharmacist 02/08/2019 11:29 PM

## 2019-02-08 NOTE — ED Notes (Signed)
Pt given turkey sandwich tray and ginger ale. 

## 2019-02-09 DIAGNOSIS — U071 COVID-19: Principal | ICD-10-CM

## 2019-02-09 DIAGNOSIS — I4891 Unspecified atrial fibrillation: Secondary | ICD-10-CM

## 2019-02-09 DIAGNOSIS — Z8673 Personal history of transient ischemic attack (TIA), and cerebral infarction without residual deficits: Secondary | ICD-10-CM

## 2019-02-09 DIAGNOSIS — I10 Essential (primary) hypertension: Secondary | ICD-10-CM

## 2019-02-09 LAB — COMPREHENSIVE METABOLIC PANEL
ALT: 20 U/L (ref 0–44)
AST: 33 U/L (ref 15–41)
Albumin: 3.4 g/dL — ABNORMAL LOW (ref 3.5–5.0)
Alkaline Phosphatase: 68 U/L (ref 38–126)
Anion gap: 11 (ref 5–15)
BUN: 22 mg/dL (ref 8–23)
CO2: 22 mmol/L (ref 22–32)
Calcium: 8.6 mg/dL — ABNORMAL LOW (ref 8.9–10.3)
Chloride: 106 mmol/L (ref 98–111)
Creatinine, Ser: 1.04 mg/dL (ref 0.61–1.24)
GFR calc Af Amer: >60 mL/min (ref >60)
GFR calc non Af Amer: >60 mL/min (ref >60)
Glucose, Bld: 155 mg/dL — ABNORMAL HIGH (ref 70–99)
Potassium: 4.5 mmol/L (ref 3.5–5.1)
Sodium: 139 mmol/L (ref 135–145)
Total Bilirubin: 0.8 mg/dL (ref 0.3–1.2)
Total Protein: 7.1 g/dL (ref 6.5–8.1)

## 2019-02-09 LAB — PROTIME-INR
INR: 1 (ref 0.8–1.2)
Prothrombin Time: 13.4 seconds (ref 11.4–15.2)

## 2019-02-09 LAB — HEPARIN LEVEL (UNFRACTIONATED): Heparin Unfractionated: 1.19 IU/mL — ABNORMAL HIGH (ref 0.30–0.70)

## 2019-02-09 LAB — CBC
HCT: 44.6 % (ref 39.0–52.0)
Hemoglobin: 14.8 g/dL (ref 13.0–17.0)
MCH: 30.5 pg (ref 26.0–34.0)
MCHC: 33.2 g/dL (ref 30.0–36.0)
MCV: 91.8 fL (ref 80.0–100.0)
Platelets: 140 10*3/uL — ABNORMAL LOW (ref 150–400)
RBC: 4.86 MIL/uL (ref 4.22–5.81)
RDW: 12.9 % (ref 11.5–15.5)
WBC: 1.2 10*3/uL — CL (ref 4.0–10.5)
nRBC: 0 % (ref 0.0–0.2)

## 2019-02-09 LAB — CBC WITH DIFFERENTIAL/PLATELET
Abs Immature Granulocytes: 0.01 10*3/uL (ref 0.00–0.07)
Basophils Absolute: 0 10*3/uL (ref 0.0–0.1)
Basophils Relative: 1 %
Eosinophils Absolute: 0 10*3/uL (ref 0.0–0.5)
Eosinophils Relative: 0 %
HCT: 45.3 % (ref 39.0–52.0)
Hemoglobin: 15.1 g/dL (ref 13.0–17.0)
Immature Granulocytes: 1 %
Lymphocytes Relative: 38 %
Lymphs Abs: 0.5 10*3/uL — ABNORMAL LOW (ref 0.7–4.0)
MCH: 30.3 pg (ref 26.0–34.0)
MCHC: 33.3 g/dL (ref 30.0–36.0)
MCV: 91 fL (ref 80.0–100.0)
Monocytes Absolute: 0.1 10*3/uL (ref 0.1–1.0)
Monocytes Relative: 6 %
Neutro Abs: 0.7 10*3/uL — ABNORMAL LOW (ref 1.7–7.7)
Neutrophils Relative %: 54 %
Platelets: 129 10*3/uL — ABNORMAL LOW (ref 150–400)
RBC: 4.98 MIL/uL (ref 4.22–5.81)
RDW: 12.8 % (ref 11.5–15.5)
Smear Review: NORMAL
WBC: 1.2 10*3/uL — CL (ref 4.0–10.5)
nRBC: 0 % (ref 0.0–0.2)

## 2019-02-09 LAB — FIBRIN DERIVATIVES D-DIMER (ARMC ONLY): Fibrin derivatives D-dimer (ARMC): 893.04 ng/mL (FEU) — ABNORMAL HIGH (ref 0.00–499.00)

## 2019-02-09 LAB — BASIC METABOLIC PANEL
Anion gap: 8 (ref 5–15)
BUN: 22 mg/dL (ref 8–23)
CO2: 26 mmol/L (ref 22–32)
Calcium: 8.5 mg/dL — ABNORMAL LOW (ref 8.9–10.3)
Chloride: 106 mmol/L (ref 98–111)
Creatinine, Ser: 0.98 mg/dL (ref 0.61–1.24)
GFR calc Af Amer: >60 mL/min (ref >60)
GFR calc non Af Amer: >60 mL/min (ref >60)
Glucose, Bld: 159 mg/dL — ABNORMAL HIGH (ref 70–99)
Potassium: 4.4 mmol/L (ref 3.5–5.1)
Sodium: 140 mmol/L (ref 135–145)

## 2019-02-09 LAB — C-REACTIVE PROTEIN
CRP: 5.4 mg/dL — ABNORMAL HIGH (ref <1.0)
CRP: 5.6 mg/dL — ABNORMAL HIGH (ref <1.0)

## 2019-02-09 LAB — TSH: TSH: 0.636 u[IU]/mL (ref 0.350–4.500)

## 2019-02-09 LAB — HEMOGLOBIN A1C
Hgb A1c MFr Bld: 5.5 % (ref 4.8–5.6)
Mean Plasma Glucose: 111.15 mg/dL

## 2019-02-09 LAB — TROPONIN I (HIGH SENSITIVITY): Troponin I (High Sensitivity): 19 ng/L — ABNORMAL HIGH (ref <18)

## 2019-02-09 LAB — BRAIN NATRIURETIC PEPTIDE: B Natriuretic Peptide: 296 pg/mL — ABNORMAL HIGH (ref 0.0–100.0)

## 2019-02-09 LAB — APTT: aPTT: 36 seconds (ref 24–36)

## 2019-02-09 MED ORDER — ADULT MULTIVITAMIN W/MINERALS CH
1.0000 | ORAL_TABLET | Freq: Every day | ORAL | Status: DC
  Administered 2019-02-10 – 2019-02-11 (×2): 1 via ORAL
  Filled 2019-02-09 (×2): qty 1

## 2019-02-09 MED ORDER — APIXABAN 5 MG PO TABS
5.0000 mg | ORAL_TABLET | Freq: Two times a day (BID) | ORAL | Status: DC
  Administered 2019-02-09 – 2019-02-11 (×5): 5 mg via ORAL
  Filled 2019-02-09 (×5): qty 1

## 2019-02-09 MED ORDER — ACETAMINOPHEN 325 MG PO TABS
650.0000 mg | ORAL_TABLET | ORAL | Status: DC | PRN
  Administered 2019-02-10: 650 mg via ORAL
  Filled 2019-02-09: qty 2

## 2019-02-09 MED ORDER — DEXAMETHASONE SODIUM PHOSPHATE 10 MG/ML IJ SOLN
6.0000 mg | Freq: Two times a day (BID) | INTRAMUSCULAR | Status: DC
  Administered 2019-02-09 – 2019-02-11 (×5): 6 mg via INTRAVENOUS
  Filled 2019-02-09 (×5): qty 1

## 2019-02-09 MED ORDER — DEXAMETHASONE 4 MG PO TABS
6.0000 mg | ORAL_TABLET | Freq: Every day | ORAL | Status: DC
  Filled 2019-02-09: qty 1.5

## 2019-02-09 MED ORDER — METOPROLOL TARTRATE 25 MG PO TABS
25.0000 mg | ORAL_TABLET | Freq: Two times a day (BID) | ORAL | Status: DC
  Administered 2019-02-09: 25 mg via ORAL
  Filled 2019-02-09: qty 1

## 2019-02-09 MED ORDER — SODIUM CHLORIDE 0.9 % IV SOLN
100.0000 mg | Freq: Every day | INTRAVENOUS | Status: DC
  Administered 2019-02-10 – 2019-02-11 (×2): 100 mg via INTRAVENOUS
  Filled 2019-02-09: qty 100
  Filled 2019-02-09: qty 20

## 2019-02-09 MED ORDER — ONDANSETRON HCL 4 MG/2ML IJ SOLN: 4.0000 mg | Freq: Four times a day (QID) | INTRAMUSCULAR | Status: DC | PRN

## 2019-02-09 MED ORDER — ENSURE ENLIVE PO LIQD
237.0000 mL | Freq: Three times a day (TID) | ORAL | Status: DC
  Administered 2019-02-09 – 2019-02-10 (×4): 237 mL via ORAL

## 2019-02-09 MED ORDER — HEPARIN (PORCINE) 25000 UT/250ML-% IV SOLN: 950.0000 [IU]/h | INTRAVENOUS | Status: DC

## 2019-02-09 MED ORDER — SODIUM CHLORIDE 0.9 % IV SOLN: INTRAVENOUS | Status: DC

## 2019-02-09 MED ORDER — ROSUVASTATIN CALCIUM 10 MG PO TABS
40.0000 mg | ORAL_TABLET | Freq: Every day | ORAL | Status: DC
  Administered 2019-02-09 – 2019-02-10 (×2): 40 mg via ORAL
  Filled 2019-02-09 (×2): qty 4

## 2019-02-09 MED ORDER — METOPROLOL TARTRATE 25 MG PO TABS
12.5000 mg | ORAL_TABLET | Freq: Two times a day (BID) | ORAL | Status: DC
  Administered 2019-02-09: 12.5 mg via ORAL
  Filled 2019-02-09: qty 1

## 2019-02-09 NOTE — Plan of Care (Signed)
  Problem: Clinical Measurements: Goal: Respiratory complications will improve 02/09/2019 0432 by Nickola Major, RN Outcome: Progressing 02/09/2019 0432 by Nickola Major, RN Outcome: Progressing

## 2019-02-09 NOTE — Progress Notes (Addendum)
Initial Nutrition Assessment  DOCUMENTATION CODES:   Not applicable  INTERVENTION:   Ensure Enlive po TID, each supplement provides 350 kcal and 20 grams of protein  Magic cup TID with meals, each supplement provides 290 kcal and 9 grams of protein  MVI daily   NUTRITION DIAGNOSIS:   Increased nutrient needs related to catabolic illness(COVID 19) as evidenced by increased estimated needs.  GOAL:   Patient will meet greater than or equal to 90% of their needs  MONITOR:   PO intake, Supplement acceptance, Labs, Weight trends, Skin, I & O's  REASON FOR ASSESSMENT:   Consult Assessment of nutrition requirement/status  ASSESSMENT:   79 y.o. male with hx of COPD (not on oxygen), HLD, HTN who presents to the emergency department for shortness of breath, nonproductive cough, fatigue, body aches, nausea with emesis x1, dizziness. Pt found to have COVID 19  RD working remotely.  Spoke with pt via phone. Pt reports poor appetite and oral intake for several days pta. Pt reports that his appetite is improved in hospital; pt reports eating 100% of meals.  RD will add supplements and MVI to help pt meet his estimated needs. Per chart, pt with limited weight history in chart but appears to have lost 11lbs(6%) since September; RD unsure how recently weight loss occurred. Pt does report some weight loss but he is unsure how much.    Medications reviewed and include: dexamethasone  Labs reviewed: wbc- 1.2(L)  Unable to complete Nutrition-Focused physical exam at this time.   Diet Order:   Diet Order            Diet regular Room service appropriate? Yes; Fluid consistency: Thin  Diet effective now             EDUCATION NEEDS:   Education needs have been addressed  Skin:  Skin Assessment: Reviewed RN Assessment  Last BM:  1/29  Height:   Ht Readings from Last 1 Encounters:  02/09/19 6\' 1"  (1.854 m)    Weight:   Wt Readings from Last 1 Encounters:  02/09/19 78.5 kg     Ideal Body Weight:  83.6 kg  BMI:  Body mass index is 22.82 kg/m.  Estimated Nutritional Needs:   Kcal:  2200-2500kcal/day  Protein:  110-125g/day  Fluid:  >2L/day  Koleen Distance MS, RD, LDN Pager #- [phone] Office#- [phone] After Hours Pager: [phone]

## 2019-02-09 NOTE — Progress Notes (Signed)
This nurse notified by lab of white count 1.2. C. Danford, MD notified, awaiting response.

## 2019-02-09 NOTE — Progress Notes (Signed)
PROGRESS NOTE    [name] Browning  [id number] DOB: Jul 26, 1940 DOA: 02/08/2019 PCP: Steele Sizer, MD      Brief Narrative:  Mr. [name] Browning is a 79 y.o. M with hx HTN, TIA/stroke without residual defs, VTE not on AC, and COPD who presented with cough, fatige for 10 days.  In the last few days, cannot eat/anorexia, anosmia, and muscle aches.    In the ER, CXR with multifocal pneumonia, SpO2 normal.  COVID+.       Assessment & Plan:  COVID-19 pneumonia Patient was admitted with fever aches, bilateral pneumonia on chest x-ray in the setting of the ongoing COVID-19 pandemic.  -Continue remdesivir and dexamethasone   New onset atrial fibrillation CHA2DS2-Vasc 5 with history stroke. Rate controlled on metoprolol -Stop heparin gtt -Start Elqiuis -Check TSH -Keep Mag>2, K>4 -Normal echo in 2017 other than grade 1 DD, doubt significant valvular disease based on exam, will defer echo to outpatient setting after COVID resolved.  Leukopenia From COVID -Check Diff -Trend CBC  Hypertension Stroke secondary prevention Blood pressure labile -Continue Crestor -Continue metoprolol -Hold lisinopril -Stop aspirin on new AC  COPD No wheezing -Schedule Combivent -Hold Symbicort -Hold PPI and Zyrtec          Disposition: The patient was admitted with COVID-19 and new onset atrial fibrillation. The patient's been weaned off of oxygen, but we will continue IV remdesivir due to his pneumonia. I will discharge when he has remained stable off oxygen, and his symptoms are resolved and he is able to go home off IV remdesivir.        MDM: The below labs and imaging reports were reviewed and summarized above.  Medication management as above.  New anticoagulation was managed.   DVT prophylaxis: N/A on Eliquis Code Status: FULL Family Communication: Granddaughter by phone    Consultants:     Procedures:     Antimicrobials:   Only remdesivir   Culture data:    None           Subjective: Patient is feeling well, he is having palpitations, but otherwise no chest pain, trouble breathing, cough, confusion.  No fever.  Objective: Vitals:   02/08/19 2330 02/09/19 0013 02/09/19 0502 02/09/19 0821  BP: 108/73 98/79 125/88 115/60  Pulse: (!) 106 71 66 61  Resp: (!) 27 20 16 18   Temp:  98.2 F (36.8 C) 97.7 F (36.5 C) 97.9 F (36.6 C)  TempSrc:  Oral Oral   SpO2: 97% 97% 97% 98%  Weight:  78.5 kg    Height:  6\' 1"  (1.854 m)      Intake/Output Summary (Last 24 hours) at 02/09/2019 1429 Last data filed at 02/09/2019 0823 Gross per 24 hour  Intake --  Output 300 ml  Net -300 ml   Filed Weights   02/08/19 1741 02/09/19 0013  Weight: 84.8 kg 78.5 kg    Examination: General appearance: Thin elderly adult male, alert and in no acute distress.   HEENT: Anicteric, conjunctiva pink, lids and lashes normal. No nasal deformity, discharge, epistaxis.  Lips moist, edentulous, oropharynx tacky dry, no oral lesions, hearing normal.   Skin: Warm and dry.  No jaundice.  No suspicious rashes or lesions. Cardiac: Irregularly irregular, normal rate, nl S1-S2, no murmurs appreciated.  Capillary refill is brisk.  JVP normal.  No LE edema.  Radial pulses 2+ and symmetric. Respiratory: Normal respiratory rate and rhythm.  CTAB without rales or wheezes. Abdomen: Abdomen soft.  No TTP or guarding. No  ascites, distension, hepatosplenomegaly.   MSK: No deformities or effusions. Neuro: Awake and alert.  EOMI, moves all extremities. Speech fluent.    Psych: Sensorium intact and responding to questions, attention normal. Affect normal.  Judgment and insight appear normal.    Data Reviewed: I have personally reviewed following labs and imaging studies:  CBC: Recent Labs  Lab 02/08/19 1805 02/09/19 0640  WBC 3.6* 1.2*  1.2*  NEUTROABS  --  0.7*  HGB 15.4 15.1  14.8  HCT 47.0 45.3  44.6  MCV 91.6 91.0  91.8  PLT 144* 129*  140*   Basic  Metabolic Panel: Recent Labs  Lab 02/08/19 1805 02/09/19 0640  NA 137 140  K 3.6 4.4  CL 101 106  CO2 27 26  GLUCOSE 120* 159*  BUN 20 22  CREATININE 1.24 0.98  CALCIUM 8.8* 8.5*   GFR: Estimated Creatinine Clearance: 70.1 mL/min (by C-G formula based on SCr of 0.98 mg/dL). Liver Function Tests: Recent Labs  Lab 02/08/19 1945  AST 34  ALT 23  ALKPHOS 76  BILITOT 1.1  PROT 7.8  ALBUMIN 4.0   No results for input(s): LIPASE, AMYLASE in the last 168 hours. No results for input(s): AMMONIA in the last 168 hours. Coagulation Profile: Recent Labs  Lab 02/08/19 2332  INR 1.0   Cardiac Enzymes: No results for input(s): CKTOTAL, CKMB, CKMBINDEX, TROPONINI in the last 168 hours. BNP (last 3 results) No results for input(s): PROBNP in the last 8760 hours. HbA1C: Recent Labs    02/09/19 0640  HGBA1C 5.5   CBG: Recent Labs  Lab 02/08/19 1759  GLUCAP 105*   Lipid Profile: No results for input(s): CHOL, HDL, LDLCALC, TRIG, CHOLHDL, LDLDIRECT in the last 72 hours. Thyroid Function Tests: Recent Labs    02/09/19 0640  TSH 0.636   Anemia Panel: Recent Labs    02/08/19 1945  FERRITIN 833*   Urine analysis:    Component Value Date/Time   COLORURINE YELLOW (A) 08/27/2017 0449   APPEARANCEUR CLEAR (A) 08/27/2017 0449   APPEARANCEUR Cloudy 08/10/2013 1825   LABSPEC 1.018 08/27/2017 0449   LABSPEC 1.014 08/10/2013 1825   PHURINE 5.0 08/27/2017 0449   GLUCOSEU NEGATIVE 08/27/2017 0449   GLUCOSEU Negative 08/10/2013 1825   HGBUR NEGATIVE 08/27/2017 0449   BILIRUBINUR NEGATIVE 08/27/2017 0449   BILIRUBINUR 1+ 08/10/2013 1825   KETONESUR NEGATIVE 08/27/2017 0449   PROTEINUR NEGATIVE 08/27/2017 0449   NITRITE NEGATIVE 08/27/2017 0449   LEUKOCYTESUR NEGATIVE 08/27/2017 0449   LEUKOCYTESUR Negative 08/10/2013 1825   Sepsis Labs: @LABRCNTIP (procalcitonin:4,lacticacidven:4)  ) Recent Results (from the past 240 hour(s))  Novel Coronavirus, NAA ([facility])      Status: None   Collection Time: 01/31/19 10:04 AM   Specimen: Nasopharyngeal(NP) swabs in vial transport medium   NASOPHARYNGE  TESTING  Result Value Ref Range Status   SARS-CoV-2, NAA Not Detected Not Detected Final    Comment: This nucleic acid amplification test was developed and its performance characteristics determined by [employer]. Nucleic acid amplification tests include RT-PCR and TMA. This test has not been FDA cleared or approved. This test has been authorized by FDA under an Emergency Use Authorization (EUA). This test is only authorized for the duration of time the declaration that circumstances exist justifying the authorization of the emergency use of in vitro diagnostic tests for detection of SARS-CoV-2 virus and/or diagnosis of COVID-19 infection under section 564(b)(1) of the Act, 21 U.S.C. PT:2852782) (1), unless the authorization is terminated or revoked sooner. When  diagnostic testing is negative, the possibility of a false negative result should be considered in the context of a patient's recent exposures and the presence of clinical signs and symptoms consistent with COVID-19. An individual without symptoms of COVID-19 and who is not shedding SARS-CoV-2 virus wo uld expect to have a negative (not detected) result in this assay.          Radiology Studies: DG Chest Port 1 View  Result Date: 02/08/2019 CLINICAL DATA:  Shortness of breath EXAM: PORTABLE CHEST 1 VIEW COMPARISON:  08/27/2017 FINDINGS: Hyperinflated lungs with emphysematous disease. Diffusely increased interstitial opacity compared to prior. No consolidation. No pleural effusion or pneumothorax. Normal heart size. Aortic atherosclerosis. Biapical pleural thickening IMPRESSION: 1. Hyperinflation with emphysematous disease 2. Diffusely increased interstitial opacity bilaterally suspect for acute interstitial process on underlying chronic disease, consider atypical infection Electronically  Signed   By: Donavan Foil M.D.   On: 02/08/2019 19:07        Scheduled Meds: . apixaban  5 mg Oral BID  . dexamethasone (DECADRON) injection  6 mg Intravenous Q12H  . metoprolol tartrate  12.5 mg Oral BID  . rosuvastatin  40 mg Oral Daily   Continuous Infusions: . [START ON 02/10/2019] remdesivir 100 mg in NS 100 mL       LOS: 1 day    Time spent: 25 minutes    Edwin Dada, MD [facility] 02/09/2019, 2:29 PM     Please page though [place] or Epic secure chat:  For [employer], Adult nurse

## 2019-02-09 NOTE — Progress Notes (Signed)
ANTICOAGULATION CONSULT NOTE   Pharmacy Consult for heparin Indication: atrial fibrillation  No Known Allergies  Patient Measurements: Height: 6\' 1"  (185.4 cm) Weight: 173 lb (78.5 kg) IBW/kg (Calculated) : 79.9 Heparin Dosing Weight: 84.8 kg  Vital Signs: Temp: 97.7 F (36.5 C) (01/30 0502) Temp Source: Oral (01/30 0502) BP: 125/88 (01/30 0502) Pulse Rate: 66 (01/30 0502)  Labs: Recent Labs    02/08/19 1805 02/08/19 1945 02/08/19 2332 02/09/19 0640  HGB 15.4  --   --  14.8  HCT 47.0  --   --  44.6  PLT 144*  --   --  140*  APTT  --   --  36  --   LABPROT  --   --  13.4  --   INR  --   --  1.0  --   HEPARINUNFRC  --   --   --  1.19*  CREATININE 1.24  --   --  0.98  TROPONINIHS  --  24* 19*  --     Estimated Creatinine Clearance: 70.1 mL/min (by C-G formula based on SCr of 0.98 mg/dL).   Medical History: Past Medical History:  Diagnosis Date  . Anxiety   . Cancer ([place])    skin cancer on right ear and right forehead removed  . Carpal tunnel syndrome    Both wrists.  . Carpal tunnel syndrome   . COPD (chronic obstructive pulmonary disease) ([place])   . DVT (deep venous thrombosis) ([place])    taken off anticoagulation after about 6 months  . HLD (hyperlipidemia)   . Hypertension   . Kidney stones   . Leaky heart valve   . Normal coronary arteries    a. by cardiac cath in 2004; b. normal stress test in 2007 and 2011; c. Lexiscan 05/20/2014: no significant ischemia, EF 55-65%, no EKG changes, low risk study   . Pneumonia   . Stroke [facility])    TIA in 2016  . Syncope     Medications:  Scheduled:    Assessment: Patient arrives w/ SOB and cough w/ h/o COPD found to have possible infx process underlying chronic lung dx w/ COVID + result. Patient also has an initial trop of 84 w/ elevated ferritin of 348, EKG showing afib w/ RVR (patient is in new onset afib w/ RVR). No anticoagulants PTA. Baseline H/h slightly elevated from pt's baseline of 13/38 w/ plts being lower  than baseline as well. Patient is being started on heparin drip for management of new onset afib w/ CHADS-VASc = 4 (HTN, age > 58, h/o stroke).  Goal of Therapy:  Heparin level 0.3-0.7 units/ml Monitor platelets by anticoagulation protocol: Yes   Plan:  Will bolus w/ heparin 4000 units IV x 1 Will start rate at 1100 units/hr Will check anti-Xa at 0700 Will monitor daily CBC's and adjust per anti-Xa levels.  1/30 0640 HL= 1.19. Will hold Heparin x 1 hour and then restart at 950 units/hr and recheck HL 8 hours after restart. Hgb 14.8  Plt 140  Chinita Greenland PharmD Clinical Pharmacist 02/09/2019

## 2019-02-09 NOTE — Progress Notes (Signed)
2.09s sinus pause per central monitoring. NP B. Randol Kern notified, no new orders. Will continue to monitor.

## 2019-02-09 NOTE — Plan of Care (Signed)
  Problem: Education: Goal: Knowledge of General Education information will improve Description: Including pain rating scale, medication(s)/side effects and non-pharmacologic comfort measures Outcome: Progressing   Problem: Health Behavior/Discharge Planning: Goal: Ability to manage health-related needs will improve Outcome: Progressing   Problem: Clinical Measurements: Goal: Ability to maintain clinical measurements within normal limits will improve Outcome: Not Progressing Note: WBC's are critically low at only 1.2. Physician aware. Will continue to monitor lab values for the remainder of the shift. Wenda Low [facility]

## 2019-02-09 NOTE — Progress Notes (Signed)
MD responds r/t white count, no new orders.

## 2019-02-10 LAB — COMPREHENSIVE METABOLIC PANEL
ALT: 24 U/L (ref 0–44)
AST: 37 U/L (ref 15–41)
Albumin: 3.1 g/dL — ABNORMAL LOW (ref 3.5–5.0)
Alkaline Phosphatase: 68 U/L (ref 38–126)
Anion gap: 7 (ref 5–15)
BUN: 31 mg/dL — ABNORMAL HIGH (ref 8–23)
CO2: 24 mmol/L (ref 22–32)
Calcium: 8.7 mg/dL — ABNORMAL LOW (ref 8.9–10.3)
Chloride: 107 mmol/L (ref 98–111)
Creatinine, Ser: 0.79 mg/dL (ref 0.61–1.24)
GFR calc Af Amer: >60 mL/min (ref >60)
GFR calc non Af Amer: >60 mL/min (ref >60)
Glucose, Bld: 150 mg/dL — ABNORMAL HIGH (ref 70–99)
Potassium: 4.3 mmol/L (ref 3.5–5.1)
Sodium: 138 mmol/L (ref 135–145)
Total Bilirubin: 0.5 mg/dL (ref 0.3–1.2)
Total Protein: 6.3 g/dL — ABNORMAL LOW (ref 6.5–8.1)

## 2019-02-10 LAB — CBC
HCT: 43.5 % (ref 39.0–52.0)
Hemoglobin: 14.4 g/dL (ref 13.0–17.0)
MCH: 30.3 pg (ref 26.0–34.0)
MCHC: 33.1 g/dL (ref 30.0–36.0)
MCV: 91.6 fL (ref 80.0–100.0)
Platelets: 152 10*3/uL (ref 150–400)
RBC: 4.75 MIL/uL (ref 4.22–5.81)
RDW: 12.7 % (ref 11.5–15.5)
WBC: 5.8 10*3/uL (ref 4.0–10.5)
nRBC: 0 % (ref 0.0–0.2)

## 2019-02-10 LAB — C-REACTIVE PROTEIN: CRP: 2.7 mg/dL — ABNORMAL HIGH (ref <1.0)

## 2019-02-10 LAB — FIBRIN DERIVATIVES D-DIMER (ARMC ONLY): Fibrin derivatives D-dimer (ARMC): 628.82 ng/mL (FEU) — ABNORMAL HIGH (ref 0.00–499.00)

## 2019-02-10 LAB — FERRITIN: Ferritin: 1028 ng/mL — ABNORMAL HIGH (ref 24–336)

## 2019-02-10 NOTE — Plan of Care (Signed)
  Problem: Education: Goal: Knowledge of General Education information will improve Description Including pain rating scale, medication(s)/side effects and non-pharmacologic comfort measures Outcome: Progressing   

## 2019-02-10 NOTE — Progress Notes (Signed)
PROGRESS NOTE    [name] Browning  [id number] DOB: 1940-11-24 DOA: 02/08/2019 PCP: Steele Sizer, MD      Brief Narrative:  [name] Browning is a 79 y.o. M with hx HTN, TIA/stroke without residual defs, VTE not on AC, and COPD who presented with cough, fatige for 10 days.  In the last few days, cannot eat/anorexia, anosmia, and muscle aches.    In the ER, CXR with multifocal pneumonia, SpO2 normal.  COVID+.       Assessment & Plan:  COVID-19 pneumonia Patient was admitted with fever aches, bilateral pneumonia on chest x-ray in the setting of the ongoing COVID-19 pandemic.  SpO2 92-94% initially, but since admission have been 96-98%. -Continue remdesivir day 2 of 5 -Continue dexamethasone, day 3   New onset atrial fibrillation CHA2DS2-Vasc 5 with history stroke. TSH normal.  Normal echo in 2017 other than grade 1 DD, doubt significant valvular disease based on exam.  Metoprolol started, patient with bradycardia overnight, HR 30s and 40s with pauses. -Stop metoprolol -Continue Eliquis  -Monitor on tele -Keep Mag>2, K>4 -Defer echo to outpatient setting after COVID resolved  Leukopenia From COVID.  Resolved today  Hypertension Stroke secondary prevention Blood pressure remains labile (systolic 0000000 to Q000111Q) HR [id number] and 40s while sleeping.  Frequent 2 second pauses. -Stop metoprolol -Hold lisinopril -Continue Crestor  -Stop aspirin on new AC  COPD No wheezing -Continue Combivent -Hold Symbicort -Hold PPI and Zyrtec          Disposition: The patient was admitted with COVID-19 and new onset atrial fibrillation.  He has been weaned off of oxygen, and has not been stable off oxygen for the last 24 hours.  He may be a reasonable candidate to discontinue remdesivir prior to 5 days, because his symptoms appear completely resolved, he is off oxygen, and feels comfortable.  Therefore if his heart rate is able to be controlled without nodal blocking agents,  reasonable to discharge on Eliquis with close cardiology follow-up after his Covid isolation.         MDM: The below labs and imaging reports reviewed and summarized above.  Medication management as above.     DVT prophylaxis: N/A on Eliquis Code Status: FULL Family Communication: Granddaughter by phone, no answer, voicemail    Consultants:     Procedures:     Antimicrobials:   Only remdesivir   Culture data:   None           Subjective: Patient feels fine, no fever, no chest pain.  He has some persistent cough, but no hemoptysis, sputum production.  Is not having palpitations, chest pain, dyspnea, orthopnea, leg swelling.     Objective: Vitals:   02/09/19 2025 02/10/19 0343 02/10/19 0858 02/10/19 1630  BP: (!) 113/50 108/73 (!) 132/92 114/73  Pulse: 72 62 (!) 57 78  Resp: 20  18 18   Temp: 97.6 F (36.4 C) (!) 97.5 F (36.4 C) 97.7 F (36.5 C) 97.9 F (36.6 C)  TempSrc: Oral Oral  Oral  SpO2: 96% 97% 96% 96%  Weight:  80.7 kg    Height:        Intake/Output Summary (Last 24 hours) at 02/10/2019 1732 Last data filed at 02/10/2019 1700 Gross per 24 hour  Intake 100 ml  Output 450 ml  Net -350 ml   Filed Weights   02/08/19 1741 02/09/19 0013 02/10/19 0343  Weight: 84.8 kg 78.5 kg 80.7 kg    Examination: General appearance: Thin elderly adult male, alert  and in no acute distress.  Lying in bed. HEENT: Anicteric, conjunctiva pink, lids and lashes normal. No nasal deformity, discharge, epistaxis.  Lips moist, teeth missing. OP tacky dry, no oral lesions.   Skin: Warm and dry.  No suspicious rashes or lesions. Cardiac: Slow, irregular, no murmurs appreciated.  No LE edema.    Respiratory: Normal respiratory rate and rhythm.  CTAB without rales or wheezes. Abdomen: Abdomen soft.  No tenderness palpation or guarding. No ascites, distension, hepatosplenomegaly.   MSK: No deformities or effusions of the large joints of the upper or lower  extremities bilaterally. Neuro: Awake and alert. Naming is grossly intact, and the patient's recall, recent and remote, as well as general fund of knowledge seem within normal limits.  Muscle tone normal, without fasciculations.  Moves all extremities equally and with normal coordination.  Speech fluent.    Psych: Sensorium intact and responding to questions, attention normal. Affect pleasant.  Judgment and insight appear normal.      Data Reviewed: I have personally reviewed following labs and imaging studies:  CBC: Recent Labs  Lab 02/08/19 1805 02/09/19 0640 02/10/19 0643  WBC 3.6* 1.2*  1.2* 5.8  NEUTROABS  --  0.7*  --   HGB 15.4 15.1  14.8 14.4  HCT 47.0 45.3  44.6 43.5  MCV 91.6 91.0  91.8 91.6  PLT 144* 129*  140* 0000000   Basic Metabolic Panel: Recent Labs  Lab 02/08/19 1805 02/09/19 0640 02/10/19 0643  NA 137 139  140 138  K 3.6 4.5  4.4 4.3  CL 101 106  106 107  CO2 27 22  26 24   GLUCOSE 120* 155*  159* 150*  BUN 20 22  22  31*  CREATININE 1.24 1.04  0.98 0.79  CALCIUM 8.8* 8.6*  8.5* 8.7*   GFR: Estimated Creatinine Clearance: 87.4 mL/min (by C-G formula based on SCr of 0.79 mg/dL). Liver Function Tests: Recent Labs  Lab 02/08/19 1945 02/09/19 0640 02/10/19 0643  AST 34 33 37  ALT 23 20 24   ALKPHOS 76 68 68  BILITOT 1.1 0.8 0.5  PROT 7.8 7.1 6.3*  ALBUMIN 4.0 3.4* 3.1*   No results for input(s): LIPASE, AMYLASE in the last 168 hours. No results for input(s): AMMONIA in the last 168 hours. Coagulation Profile: Recent Labs  Lab 02/08/19 2332  INR 1.0   Cardiac Enzymes: No results for input(s): CKTOTAL, CKMB, CKMBINDEX, TROPONINI in the last 168 hours. BNP (last 3 results) No results for input(s): PROBNP in the last 8760 hours. HbA1C: Recent Labs    02/09/19 0640  HGBA1C 5.5   CBG: Recent Labs  Lab 02/08/19 1759  GLUCAP 105*   Lipid Profile: No results for input(s): CHOL, HDL, LDLCALC, TRIG, CHOLHDL, LDLDIRECT in the last  72 hours. Thyroid Function Tests: Recent Labs    02/09/19 0640  TSH 0.636   Anemia Panel: Recent Labs    02/08/19 1945 02/10/19 0643  FERRITIN 833* 1,028*   Urine analysis:    Component Value Date/Time   COLORURINE YELLOW (A) 08/27/2017 0449   APPEARANCEUR CLEAR (A) 08/27/2017 0449   APPEARANCEUR Cloudy 08/10/2013 1825   LABSPEC 1.018 08/27/2017 0449   LABSPEC 1.014 08/10/2013 1825   PHURINE 5.0 08/27/2017 0449   GLUCOSEU NEGATIVE 08/27/2017 0449   GLUCOSEU Negative 08/10/2013 1825   HGBUR NEGATIVE 08/27/2017 0449   BILIRUBINUR NEGATIVE 08/27/2017 0449   BILIRUBINUR 1+ 08/10/2013 1825   KETONESUR NEGATIVE 08/27/2017 0449   PROTEINUR NEGATIVE 08/27/2017 0449   NITRITE  NEGATIVE 08/27/2017 0449   LEUKOCYTESUR NEGATIVE 08/27/2017 0449   LEUKOCYTESUR Negative 08/10/2013 1825   Sepsis Labs: @LABRCNTIP (procalcitonin:4,lacticacidven:4)  ) No results found for this or any previous visit (from the past 240 hour(s)).       Radiology Studies: DG Chest Port 1 View  Result Date: 02/08/2019 CLINICAL DATA:  Shortness of breath EXAM: PORTABLE CHEST 1 VIEW COMPARISON:  08/27/2017 FINDINGS: Hyperinflated lungs with emphysematous disease. Diffusely increased interstitial opacity compared to prior. No consolidation. No pleural effusion or pneumothorax. Normal heart size. Aortic atherosclerosis. Biapical pleural thickening IMPRESSION: 1. Hyperinflation with emphysematous disease 2. Diffusely increased interstitial opacity bilaterally suspect for acute interstitial process on underlying chronic disease, consider atypical infection Electronically Signed   By: Donavan Foil M.D.   On: 02/08/2019 19:07        Scheduled Meds: . apixaban  5 mg Oral BID  . dexamethasone (DECADRON) injection  6 mg Intravenous Q12H  . feeding supplement (ENSURE ENLIVE)  237 mL Oral TID BM  . multivitamin with minerals  1 tablet Oral Daily  . rosuvastatin  40 mg Oral Daily   Continuous Infusions: .  remdesivir 100 mg in NS 100 mL 100 mg (02/10/19 1155)     LOS: 2 days    Time spent: 25 minutes    Edwin Dada, MD [facility] 02/10/2019, 5:32 PM     Please page though [place] or Epic secure chat:  For [employer], Adult nurse

## 2019-02-10 NOTE — Evaluation (Signed)
Physical Therapy Evaluation Patient Details Name: NEHAL WERLEY MRN: [id number] DOB: 17-Oct-1940 Today's Date: 02/10/2019   History of Present Illness   79 y.o. male with medical history significant for presbycusis, tia, htn, history dvt not currently anticoagulated, possible copd, here with cough and fatigue.  Pt lives with multiple Covid + family members, he also tested positive this admission.   Clinical Impression  Pt did well with PT exam and showed good confidence with all tasks.  He did have some mild unsteadiness/ataxia associated with L LE (minimally weaker than R on testing, though still [facility]).  Pt did not report excessive fatigue, but his HR climbed with activity, when 140 bpm was reached (at ~100 ft) PT returned him to the bed and no further activity pursued.  He reports feeling close to his baseline, apart from generally being more tired than normal.  He states he stays very active and will ease back into his routine, not interested in HHPT and frankly he probably does not need f/u once discharged.  PT did encourage him to use a cane initially when he returns home 2/2 to the mild unsteadiness and the implications of Covid.  Pt confident that he will be able to manage at home with assist from family, will maintain on PT caseload should be end up being here for a while to address [facility] use, do further balance/activity tolerance training.     Follow Up Recommendations No PT follow up(pt not interested)   Equipment Recommendations  None recommended by PT    Recommendations for Other Services       Precautions / Restrictions Precautions Precautions: Fall(moderate fall) Restrictions Weight Bearing Restrictions: No      Mobility  Bed Mobility Overal bed mobility: Independent             General bed mobility comments: Pt able to get to sitting w/o hesitation or assist  Transfers Overall transfer level: Independent Equipment used: None             General transfer  comment: Pt able to rise confidently and maintain balance w/o issue   Ambulation/Gait Ambulation/Gait assistance: Supervision Gait Distance (Feet): 100 Feet Assistive device: None       General Gait Details: Pt able to do multiple loops in the room with safe turns and no LOBs.  He did have some occasional, very minimal ataxic stepping with L LE but no LOBs or overt unsteadiness.  Biggest issue however was elevated HR, rising to as high as the 140s before PT decided to stop further standing assessment and return him to his bed.  Stairs            Wheelchair Mobility    Modified Rankin (Stroke Patients Only)       Balance Overall balance assessment: Modified Independent;Mild deficits observed, not formally tested                                           Pertinent Vitals/Pain Pain Assessment: No/denies pain    Home Living Family/patient expects to be discharged to:: Private residence Living Arrangements: Children;Other relatives(ex-wife) Available Help at Discharge: Family;Available 24 hours/day   Home Access: Stairs to enter Entrance Stairs-Rails: ("yes") Entrance Stairs-Number of Steps: 4   Home Equipment: Walker - 2 wheels;Cane - single point      Prior Function Level of Independence: Independent  Comments: Pt reports he runs errands, works in the yard, does anything he needs w/o issue     Journalist, newspaper        Extremity/Trunk Assessment   Upper Extremity Assessment Upper Extremity Assessment: Overall WFL for tasks assessed    Lower Extremity Assessment Lower Extremity Assessment: Overall WFL for tasks assessed(L LE weaker than R, but WFL)       Communication   Communication: No difficulties  Cognition Arousal/Alertness: Awake/alert Behavior During Therapy: WFL for tasks assessed/performed Overall Cognitive Status: Within Functional Limits for tasks assessed                                         General Comments      Exercises     Assessment/Plan    PT Assessment Patient needs continued PT services  PT Problem List Decreased activity tolerance;Cardiopulmonary status limiting activity;Decreased balance;Decreased safety awareness;Decreased knowledge of use of DME       PT Treatment Interventions DME instruction;Gait training;Stair training;Functional mobility training;Therapeutic activities;Therapeutic exercise;Balance training;Neuromuscular re-education;Patient/family education    PT Goals (Current goals can be found in the Care Plan section)  Acute Rehab PT Goals Patient Stated Goal: go home tomorrow PT Goal Formulation: With patient Time For Goal Achievement: 02/24/19 Potential to Achieve Goals: Good    Frequency Min 2X/week   Barriers to discharge        Co-evaluation               AM-PAC PT "6 Clicks" Mobility  Outcome Measure Help needed turning from your back to your side while in a flat bed without using bedrails?: None Help needed moving from lying on your back to sitting on the side of a flat bed without using bedrails?: None Help needed moving to and from a bed to a chair (including a wheelchair)?: None Help needed standing up from a chair using your arms (e.g., wheelchair or bedside chair)?: None Help needed to walk in hospital room?: A Little Help needed climbing 3-5 steps with a railing? : A Little 6 Click Score: 22    End of Session   Activity Tolerance: Patient tolerated treatment well;Patient limited by fatigue(HR to 140s with modest activity) Patient left: in bed;with call bell/phone within reach Nurse Communication: (HR elevation with activity) PT Visit Diagnosis: Muscle weakness (generalized) (M62.81);Unsteadiness on feet (R26.81)    Time: [id number] PT Time Calculation (min) (ACUTE ONLY): 17 min   Charges:   PT Evaluation $PT Eval Low Complexity: 1 Low          Azoria Abbett R Dewayne Jurek. DPT 02/10/2019, 3:50 PM

## 2019-02-11 LAB — COMPREHENSIVE METABOLIC PANEL
ALT: 49 U/L — ABNORMAL HIGH (ref 0–44)
AST: 60 U/L — ABNORMAL HIGH (ref 15–41)
Albumin: 3 g/dL — ABNORMAL LOW (ref 3.5–5.0)
Alkaline Phosphatase: 74 U/L (ref 38–126)
Anion gap: 9 (ref 5–15)
BUN: 34 mg/dL — ABNORMAL HIGH (ref 8–23)
CO2: 27 mmol/L (ref 22–32)
Calcium: 9.1 mg/dL (ref 8.9–10.3)
Chloride: 103 mmol/L (ref 98–111)
Creatinine, Ser: 0.81 mg/dL (ref 0.61–1.24)
GFR calc Af Amer: >60 mL/min (ref >60)
GFR calc non Af Amer: >60 mL/min (ref >60)
Glucose, Bld: 159 mg/dL — ABNORMAL HIGH (ref 70–99)
Potassium: 4.9 mmol/L (ref 3.5–5.1)
Sodium: 139 mmol/L (ref 135–145)
Total Bilirubin: 0.6 mg/dL (ref 0.3–1.2)
Total Protein: 6.1 g/dL — ABNORMAL LOW (ref 6.5–8.1)

## 2019-02-11 LAB — C-REACTIVE PROTEIN: CRP: 1.2 mg/dL — ABNORMAL HIGH (ref <1.0)

## 2019-02-11 LAB — CBC
HCT: 45.1 % (ref 39.0–52.0)
Hemoglobin: 14.7 g/dL (ref 13.0–17.0)
MCH: 30.1 pg (ref 26.0–34.0)
MCHC: 32.6 g/dL (ref 30.0–36.0)
MCV: 92.2 fL (ref 80.0–100.0)
Platelets: 174 10*3/uL (ref 150–400)
RBC: 4.89 MIL/uL (ref 4.22–5.81)
RDW: 12.4 % (ref 11.5–15.5)
WBC: 9.2 10*3/uL (ref 4.0–10.5)
nRBC: 0 % (ref 0.0–0.2)

## 2019-02-11 LAB — FERRITIN: Ferritin: 1233 ng/mL — ABNORMAL HIGH (ref 24–336)

## 2019-02-11 LAB — FIBRIN DERIVATIVES D-DIMER (ARMC ONLY): Fibrin derivatives D-dimer (ARMC): 475.29 ng/mL (FEU) (ref 0.00–499.00)

## 2019-02-11 LAB — PATHOLOGIST SMEAR REVIEW

## 2019-02-11 MED ORDER — APIXABAN 5 MG PO TABS: 5.0000 mg | ORAL_TABLET | Freq: Two times a day (BID) | ORAL | 0 refills | Status: DC

## 2019-02-11 MED ORDER — LISINOPRIL 20 MG PO TABS
20.0000 mg | ORAL_TABLET | Freq: Every day | ORAL | Status: DC
  Administered 2019-02-11: 20 mg via ORAL
  Filled 2019-02-11: qty 1

## 2019-02-11 NOTE — Care Management Important Message (Signed)
Important Message  Patient Details  Name: [name] Browning MRN: [id number] Date of Birth: [birth year]   Medicare Important Message Given:  Yes  Initial Medicare IM given by Patient Access Associate on 02/09/2019 at 8:32am.  Still valid.     Dannette Barbara 02/11/2019, 11:53 AM

## 2019-02-11 NOTE — Discharge Summary (Addendum)
Physician Discharge Summary   [name] Browning  male DOB: [birth year]  [id number]  PCP: Derinda Late, MD  Admit date: 02/08/2019 Discharge date: 02/11/2019  Admitted From: home Disposition:  home CODE STATUS: Full code  Discharge Instructions    Diet - low sodium heart healthy   Complete by: As directed    Discharge instructions   Complete by: As directed    You have COVID-19 infection, but because you are improving and don't need supplemental oxygen, you can go home to recover.  You have been prescribed Eliquis, a blood thinner to prevent stroke because you have Afib (atrial fibrillation).  Prescription and coupon for first month provided.  Please follow up with PCP to continue subsequent prescriptions.  Dr. Enzo Bi - -   Increase activity slowly   Complete by: As directed        Hospital Course:  For full details, please see H&P, progress notes, consult notes and ancillary notes.  Briefly,  [name] Browning is a 79 y.o. M with hx HTN, TIA/stroke without residual defs, VTE not on AC, and COPD who presented with cough, fatige for 10 days.  In the last few days, cannot eat/anorexia, anosmia, and muscle aches.    In the ER, CXR with multifocal pneumonia, SpO2 normal.  COVID+.  COVID-19 pneumonia Patient was admitted with fever aches, bilateral pneumonia on chest x-ray in the setting of the ongoing COVID-19 pandemic.  SpO2 92-94% initially, but since admission have been 96-98%. Received 2 days of remdesivir and dexamethasone.  Since pt was never hypoxic, and symptoms improved, pt was discharge home to recover.    New onset atrial fibrillation CHA2DS2-Vasc 5 with history stroke.  TSH normal.  Normal echo in 2017 other than grade 1 DD, doubt significant valvular disease based on exam.  Pt was started on Eliquis.  HR 30s and 40s while sleeping.  Frequent 2 second pauses.  Did not start rate control agent.  Leukopenia From COVID.  Resolved.  Hypertension Stroke  secondary prevention Blood pressure remains labile (systolic 0000000 to Q000111Q).  Resume home Lisinopril and stain after discharge.  ASA d/c'ed now on Eliquis.  COPD No wheezing.  Continue home bronchodilators.  # elevated troponin - mildly elevated to 25. Denies ischemic symptoms. Likely demand 2/2 afib and acute illness (covid). EKG w/o ischemic changes.   Discharge Diagnoses:  Principal Problem:   COVID-19 virus infection Active Problems:   History of TIA (transient ischemic attack)   HTN (hypertension)   New onset atrial fibrillation [facility])    Discharge Instructions:  Allergies as of 02/11/2019   No Known Allergies     Medication List    STOP taking these medications   ALPRAZolam 0.5 MG tablet Commonly known as: XANAX   doxycycline 100 MG tablet Commonly known as: VIBRA-TABS   oxyCODONE 5 MG immediate release tablet Commonly known as: Roxicodone     TAKE these medications   apixaban 5 MG Tabs tablet Commonly known as: ELIQUIS Take 1 tablet (5 mg total) by mouth 2 (two) times daily.   aspirin EC 81 MG tablet Take 81 mg by mouth daily as needed.   cetirizine 10 MG tablet Commonly known as: ZYRTEC Take 10 mg by mouth daily.   Cholecalciferol 125 MCG (5000 UT) capsule Take 1 capsule by mouth daily.   Iron 240 (27 Fe) MG Tabs Take 1 tablet by mouth daily.   lisinopril 20 MG tablet Commonly known as: ZESTRIL TAKE 1 TABLET BY MOUTH EVERY DAY  metroNIDAZOLE 1 % gel Commonly known as: METROGEL Apply 1 application topically 2 (two) times daily. Apply topically 1 to 2 times a day   pantoprazole 40 MG tablet Commonly known as: PROTONIX Take 40 mg by mouth daily.   rosuvastatin 40 MG tablet Commonly known as: Crestor Take 1 tablet (40 mg total) by mouth daily.   Symbicort 160-4.5 MCG/ACT inhaler Generic drug: budesonide-formoterol Inhale 2 puffs into the lungs daily.       Follow-up Information    Steele Sizer, MD. Schedule an appointment as soon as  possible for a visit in 1 week(s).   Specialty: Family Medicine Contact information: [address] [address] [phone]           Allergies  Allergen Reactions  . Hydromorphone     Other reaction(s): Hallucination     The results of significant diagnostics from this hospitalization (including imaging, microbiology, ancillary and laboratory) are listed below for reference.   Consultations:   Procedures/Studies: DG Chest Port 1 View  Result Date: 02/08/2019 CLINICAL DATA:  Shortness of breath EXAM: PORTABLE CHEST 1 VIEW COMPARISON:  08/27/2017 FINDINGS: Hyperinflated lungs with emphysematous disease. Diffusely increased interstitial opacity compared to prior. No consolidation. No pleural effusion or pneumothorax. Normal heart size. Aortic atherosclerosis. Biapical pleural thickening IMPRESSION: 1. Hyperinflation with emphysematous disease 2. Diffusely increased interstitial opacity bilaterally suspect for acute interstitial process on underlying chronic disease, consider atypical infection Electronically Signed   By: Donavan Foil M.D.   On: 02/08/2019 19:07      Labs: BNP (last 3 results) Recent Labs    02/09/19 0640  BNP [id number]*   Basic Metabolic Panel: No results for input(s): NA, K, CL, CO2, GLUCOSE, BUN, CREATININE, CALCIUM, MG, PHOS in the last 168 hours. Liver Function Tests: No results for input(s): AST, ALT, ALKPHOS, BILITOT, PROT, ALBUMIN in the last 168 hours. No results for input(s): LIPASE, AMYLASE in the last 168 hours. No results for input(s): AMMONIA in the last 168 hours. CBC: No results for input(s): WBC, NEUTROABS, HGB, HCT, MCV, PLT in the last 168 hours. Cardiac Enzymes: No results for input(s): CKTOTAL, CKMB, CKMBINDEX, TROPONINI in the last 168 hours. BNP: Invalid input(s): POCBNP CBG: No results for input(s): GLUCAP in the last 168 hours. D-Dimer No results for input(s): DDIMER in the last 72 hours. Hgb A1c No  results for input(s): HGBA1C in the last 72 hours. Lipid Profile No results for input(s): CHOL, HDL, LDLCALC, TRIG, CHOLHDL, LDLDIRECT in the last 72 hours. Thyroid function studies No results for input(s): TSH, T4TOTAL, T3FREE, THYROIDAB in the last 72 hours.  Invalid input(s): FREET3 Anemia work up No results for input(s): VITAMINB12, FOLATE, FERRITIN, TIBC, IRON, RETICCTPCT in the last 72 hours. Urinalysis    Component Value Date/Time   COLORURINE YELLOW (A) 08/27/2017 0449   APPEARANCEUR CLEAR (A) 08/27/2017 0449   APPEARANCEUR Cloudy 08/10/2013 1825   LABSPEC 1.018 08/27/2017 0449   LABSPEC 1.014 08/10/2013 1825   PHURINE 5.0 08/27/2017 0449   GLUCOSEU NEGATIVE 08/27/2017 0449   GLUCOSEU Negative 08/10/2013 1825   HGBUR NEGATIVE 08/27/2017 0449   BILIRUBINUR NEGATIVE 08/27/2017 0449   BILIRUBINUR 1+ 08/10/2013 1825   KETONESUR NEGATIVE 08/27/2017 0449   PROTEINUR NEGATIVE 08/27/2017 0449   NITRITE NEGATIVE 08/27/2017 0449   LEUKOCYTESUR NEGATIVE 08/27/2017 0449   LEUKOCYTESUR Negative 08/10/2013 1825   Sepsis Labs Invalid input(s): PROCALCITONIN,  WBC,  LACTICIDVEN Microbiology No results found for this or any previous visit (from the past 240 hour(s)).  Total time spend on discharging this patient, including the last patient exam, discussing the hospital stay, instructions for ongoing care as it relates to all pertinent caregivers, as well as preparing the medical discharge records, prescriptions, and/or referrals as applicable, is 30 minutes.    Enzo Bi, MD  [facility] 02/20/2019, 6:52 PM  If 7PM-7AM, please contact night-coverage

## 2019-02-11 NOTE — Plan of Care (Signed)
  Problem: Education: Goal: Knowledge of General Education information will improve Description: Including pain rating scale, medication(s)/side effects and non-pharmacologic comfort measures Outcome: Adequate for Discharge   Problem: Health Behavior/Discharge Planning: Goal: Ability to manage health-related needs will improve Outcome: Adequate for Discharge   Problem: Clinical Measurements: Goal: Ability to maintain clinical measurements within normal limits will improve Outcome: Adequate for Discharge Goal: Will remain free from infection Outcome: Adequate for Discharge Goal: Diagnostic test results will improve Outcome: Adequate for Discharge Goal: Respiratory complications will improve Outcome: Adequate for Discharge Goal: Cardiovascular complication will be avoided Outcome: Adequate for Discharge   Problem: Activity: Goal: Risk for activity intolerance will decrease Outcome: Adequate for Discharge   Problem: Nutrition: Goal: Adequate nutrition will be maintained Outcome: Adequate for Discharge   Problem: Elimination: Goal: Will not experience complications related to bowel motility Outcome: Adequate for Discharge Goal: Will not experience complications related to urinary retention Outcome: Adequate for Discharge   Problem: Pain Managment: Goal: General experience of comfort will improve Outcome: Adequate for Discharge   Problem: Safety: Goal: Ability to remain free from injury will improve Outcome: Adequate for Discharge   Problem: Skin Integrity: Goal: Risk for impaired skin integrity will decrease Outcome: Adequate for Discharge   

## 2019-02-12 ENCOUNTER — Telehealth: Payer: Self-pay

## 2019-02-12 LAB — MAG INTERPRETATION REFLEXED

## 2019-02-12 LAB — MAG IGM ANTIBODIES: MAG IgM Antibodies: <900 BTU (ref 0–999)

## 2019-02-12 NOTE — Telephone Encounter (Signed)
Erroneous encounter.   Called patient due to being on hospital discharge report and patient states he is still a patient at [facility] clinic under Dr. Baldemar Lenis. Advised patient to contact their office for hospital follow up.

## 2019-03-06 DIAGNOSIS — J449 Chronic obstructive pulmonary disease, unspecified: Secondary | ICD-10-CM | POA: Diagnosis not present

## 2019-03-06 DIAGNOSIS — I4891 Unspecified atrial fibrillation: Secondary | ICD-10-CM | POA: Diagnosis not present

## 2019-03-06 DIAGNOSIS — Z8616 Personal history of COVID-19: Secondary | ICD-10-CM | POA: Insufficient documentation

## 2019-03-06 DIAGNOSIS — I10 Essential (primary) hypertension: Secondary | ICD-10-CM | POA: Diagnosis not present

## 2019-03-12 DIAGNOSIS — R739 Hyperglycemia, unspecified: Secondary | ICD-10-CM | POA: Diagnosis not present

## 2019-03-12 DIAGNOSIS — E7800 Pure hypercholesterolemia, unspecified: Secondary | ICD-10-CM | POA: Diagnosis not present

## 2019-03-12 DIAGNOSIS — Z79899 Other long term (current) drug therapy: Secondary | ICD-10-CM | POA: Diagnosis not present

## 2019-03-15 DIAGNOSIS — I4891 Unspecified atrial fibrillation: Secondary | ICD-10-CM | POA: Diagnosis not present

## 2019-03-18 DIAGNOSIS — Z79899 Other long term (current) drug therapy: Secondary | ICD-10-CM | POA: Diagnosis not present

## 2019-03-18 DIAGNOSIS — K219 Gastro-esophageal reflux disease without esophagitis: Secondary | ICD-10-CM | POA: Diagnosis not present

## 2019-03-18 DIAGNOSIS — Z7901 Long term (current) use of anticoagulants: Secondary | ICD-10-CM | POA: Diagnosis not present

## 2019-03-18 DIAGNOSIS — I10 Essential (primary) hypertension: Secondary | ICD-10-CM | POA: Diagnosis not present

## 2019-03-18 DIAGNOSIS — I4891 Unspecified atrial fibrillation: Secondary | ICD-10-CM | POA: Diagnosis not present

## 2019-03-18 DIAGNOSIS — R739 Hyperglycemia, unspecified: Secondary | ICD-10-CM | POA: Diagnosis not present

## 2019-03-18 DIAGNOSIS — E785 Hyperlipidemia, unspecified: Secondary | ICD-10-CM | POA: Diagnosis not present

## 2019-03-18 DIAGNOSIS — Z87891 Personal history of nicotine dependence: Secondary | ICD-10-CM | POA: Diagnosis not present

## 2019-03-18 DIAGNOSIS — J449 Chronic obstructive pulmonary disease, unspecified: Secondary | ICD-10-CM | POA: Diagnosis not present

## 2019-03-25 ENCOUNTER — Emergency Department: Payer: Medicare HMO

## 2019-03-25 ENCOUNTER — Other Ambulatory Visit: Payer: Self-pay

## 2019-03-25 ENCOUNTER — Inpatient Hospital Stay
Admission: EM | Admit: 2019-03-25 | Discharge: 2019-03-27 | DRG: 193 | Disposition: A | Discharge status: Home / Self Care | Payer: Medicare HMO | Attending: Student | Admitting: Student

## 2019-03-25 DIAGNOSIS — Z87891 Personal history of nicotine dependence: Secondary | ICD-10-CM | POA: Diagnosis not present

## 2019-03-25 DIAGNOSIS — J440 Chronic obstructive pulmonary disease with acute lower respiratory infection: Secondary | ICD-10-CM | POA: Diagnosis present

## 2019-03-25 DIAGNOSIS — R05 Cough: Secondary | ICD-10-CM | POA: Diagnosis not present

## 2019-03-25 DIAGNOSIS — I10 Essential (primary) hypertension: Secondary | ICD-10-CM | POA: Diagnosis not present

## 2019-03-25 DIAGNOSIS — Z7901 Long term (current) use of anticoagulants: Secondary | ICD-10-CM | POA: Diagnosis not present

## 2019-03-25 DIAGNOSIS — J189 Pneumonia, unspecified organism: Principal | ICD-10-CM | POA: Diagnosis present

## 2019-03-25 DIAGNOSIS — R42 Dizziness and giddiness: Secondary | ICD-10-CM | POA: Diagnosis not present

## 2019-03-25 DIAGNOSIS — Z86718 Personal history of other venous thrombosis and embolism: Secondary | ICD-10-CM | POA: Diagnosis not present

## 2019-03-25 DIAGNOSIS — E785 Hyperlipidemia, unspecified: Secondary | ICD-10-CM | POA: Diagnosis present

## 2019-03-25 DIAGNOSIS — R0902 Hypoxemia: Secondary | ICD-10-CM | POA: Diagnosis not present

## 2019-03-25 DIAGNOSIS — Z8249 Family history of ischemic heart disease and other diseases of the circulatory system: Secondary | ICD-10-CM | POA: Diagnosis not present

## 2019-03-25 DIAGNOSIS — J441 Chronic obstructive pulmonary disease with (acute) exacerbation: Secondary | ICD-10-CM | POA: Diagnosis not present

## 2019-03-25 DIAGNOSIS — U071 COVID-19: Secondary | ICD-10-CM

## 2019-03-25 DIAGNOSIS — Z8616 Personal history of COVID-19: Secondary | ICD-10-CM | POA: Diagnosis not present

## 2019-03-25 DIAGNOSIS — J9601 Acute respiratory failure with hypoxia: Secondary | ICD-10-CM | POA: Diagnosis present

## 2019-03-25 DIAGNOSIS — K219 Gastro-esophageal reflux disease without esophagitis: Secondary | ICD-10-CM | POA: Diagnosis present

## 2019-03-25 DIAGNOSIS — I4891 Unspecified atrial fibrillation: Secondary | ICD-10-CM | POA: Diagnosis present

## 2019-03-25 DIAGNOSIS — Z8673 Personal history of transient ischemic attack (TIA), and cerebral infarction without residual deficits: Secondary | ICD-10-CM | POA: Diagnosis not present

## 2019-03-25 DIAGNOSIS — R0602 Shortness of breath: Secondary | ICD-10-CM | POA: Diagnosis not present

## 2019-03-25 DIAGNOSIS — Z20822 Contact with and (suspected) exposure to covid-19: Secondary | ICD-10-CM | POA: Diagnosis not present

## 2019-03-25 DIAGNOSIS — R918 Other nonspecific abnormal finding of lung field: Secondary | ICD-10-CM | POA: Diagnosis not present

## 2019-03-25 LAB — COMPREHENSIVE METABOLIC PANEL
ALT: 31 U/L (ref 0–44)
AST: 31 U/L (ref 15–41)
Albumin: 3.6 g/dL (ref 3.5–5.0)
Alkaline Phosphatase: 79 U/L (ref 38–126)
Anion gap: 7 (ref 5–15)
BUN: 17 mg/dL (ref 8–23)
CO2: 26 mmol/L (ref 22–32)
Calcium: 8.7 mg/dL — ABNORMAL LOW (ref 8.9–10.3)
Chloride: 106 mmol/L (ref 98–111)
Creatinine, Ser: 0.99 mg/dL (ref 0.61–1.24)
GFR calc Af Amer: >60 mL/min (ref >60)
GFR calc non Af Amer: >60 mL/min (ref >60)
Glucose, Bld: 109 mg/dL — ABNORMAL HIGH (ref 70–99)
Potassium: 3.7 mmol/L (ref 3.5–5.1)
Sodium: 139 mmol/L (ref 135–145)
Total Bilirubin: 0.9 mg/dL (ref 0.3–1.2)
Total Protein: 7.1 g/dL (ref 6.5–8.1)

## 2019-03-25 LAB — RESPIRATORY PANEL BY RT PCR (FLU A&B, COVID)
Influenza A by PCR: NEGATIVE
Influenza B by PCR: NEGATIVE
SARS Coronavirus 2 by RT PCR: NEGATIVE

## 2019-03-25 LAB — FERRITIN: Ferritin: 270 ng/mL (ref 24–336)

## 2019-03-25 LAB — CBC WITH DIFFERENTIAL/PLATELET
Abs Immature Granulocytes: 0.02 10*3/uL (ref 0.00–0.07)
Basophils Absolute: 0 10*3/uL (ref 0.0–0.1)
Basophils Relative: 1 %
Eosinophils Absolute: 0.2 10*3/uL (ref 0.0–0.5)
Eosinophils Relative: 2 %
HCT: 40.1 % (ref 39.0–52.0)
Hemoglobin: 13.1 g/dL (ref 13.0–17.0)
Immature Granulocytes: 0 %
Lymphocytes Relative: 17 %
Lymphs Abs: 1.4 10*3/uL (ref 0.7–4.0)
MCH: 30.4 pg (ref 26.0–34.0)
MCHC: 32.7 g/dL (ref 30.0–36.0)
MCV: 93 fL (ref 80.0–100.0)
Monocytes Absolute: 0.7 10*3/uL (ref 0.1–1.0)
Monocytes Relative: 8 %
Neutro Abs: 6 10*3/uL (ref 1.7–7.7)
Neutrophils Relative %: 72 %
Platelets: 183 10*3/uL (ref 150–400)
RBC: 4.31 MIL/uL (ref 4.22–5.81)
RDW: 13.3 % (ref 11.5–15.5)
WBC: 8.3 10*3/uL (ref 4.0–10.5)
nRBC: 0 % (ref 0.0–0.2)

## 2019-03-25 LAB — PROCALCITONIN: Procalcitonin: <0.1 ng/mL

## 2019-03-25 LAB — C-REACTIVE PROTEIN: CRP: 4 mg/dL — ABNORMAL HIGH (ref <1.0)

## 2019-03-25 LAB — FIBRINOGEN: Fibrinogen: 538 mg/dL — ABNORMAL HIGH (ref 210–475)

## 2019-03-25 LAB — LACTIC ACID, PLASMA: Lactic Acid, Venous: 1.1 mmol/L (ref 0.5–1.9)

## 2019-03-25 LAB — LACTATE DEHYDROGENASE: LDH: 172 U/L (ref 98–192)

## 2019-03-25 LAB — TRIGLYCERIDES: Triglycerides: 78 mg/dL (ref <150)

## 2019-03-25 LAB — FIBRIN DERIVATIVES D-DIMER (ARMC ONLY): Fibrin derivatives D-dimer (ARMC): 1032.84 ng/mL (FEU) — ABNORMAL HIGH (ref 0.00–499.00)

## 2019-03-25 LAB — TROPONIN I (HIGH SENSITIVITY): Troponin I (High Sensitivity): 37 ng/L — ABNORMAL HIGH (ref <18)

## 2019-03-25 LAB — BRAIN NATRIURETIC PEPTIDE: B Natriuretic Peptide: 286 pg/mL — ABNORMAL HIGH (ref 0.0–100.0)

## 2019-03-25 LAB — ABO/RH: ABO/RH(D): O POS

## 2019-03-25 MED ORDER — ROSUVASTATIN CALCIUM 20 MG PO TABS
40.0000 mg | ORAL_TABLET | Freq: Every day | ORAL | Status: DC
  Administered 2019-03-25 – 2019-03-27 (×3): 40 mg via ORAL
  Filled 2019-03-25 (×3): qty 2

## 2019-03-25 MED ORDER — DEXAMETHASONE SODIUM PHOSPHATE 10 MG/ML IJ SOLN
10.0000 mg | Freq: Once | INTRAMUSCULAR | Status: AC
  Administered 2019-03-25: 10 mg via INTRAVENOUS
  Filled 2019-03-25: qty 1

## 2019-03-25 MED ORDER — HYDROCOD POLST-CPM POLST ER 10-8 MG/5ML PO SUER: 5.0000 mL | Freq: Two times a day (BID) | ORAL | Status: DC | PRN

## 2019-03-25 MED ORDER — TRAZODONE HCL 50 MG PO TABS
25.0000 mg | ORAL_TABLET | Freq: Every evening | ORAL | Status: DC | PRN
  Administered 2019-03-26: 03:00:00 25 mg via ORAL
  Filled 2019-03-25: qty 1

## 2019-03-25 MED ORDER — ONDANSETRON HCL 4 MG PO TABS: 4.0000 mg | ORAL_TABLET | Freq: Four times a day (QID) | ORAL | Status: DC | PRN

## 2019-03-25 MED ORDER — SODIUM CHLORIDE 0.9 % IV SOLN
500.0000 mg | INTRAVENOUS | Status: DC
  Administered 2019-03-25 – 2019-03-26 (×2): 500 mg via INTRAVENOUS
  Filled 2019-03-25 (×2): qty 500

## 2019-03-25 MED ORDER — SODIUM CHLORIDE 0.9 % IV SOLN
2.0000 g | INTRAVENOUS | Status: DC
  Administered 2019-03-25 – 2019-03-27 (×3): 2 g via INTRAVENOUS
  Filled 2019-03-25: qty 20
  Filled 2019-03-25: qty 2
  Filled 2019-03-25: qty 20
  Filled 2019-03-25: qty 2

## 2019-03-25 MED ORDER — IOHEXOL 350 MG/ML SOLN
75.0000 mL | Freq: Once | INTRAVENOUS | Status: AC | PRN
  Administered 2019-03-25: 75 mL via INTRAVENOUS

## 2019-03-25 MED ORDER — MAGNESIUM HYDROXIDE 400 MG/5ML PO SUSP
30.0000 mL | Freq: Every day | ORAL | Status: DC | PRN
  Filled 2019-03-25: qty 30

## 2019-03-25 MED ORDER — FERROUS GLUCONATE 324 (38 FE) MG PO TABS
324.0000 mg | ORAL_TABLET | Freq: Every day | ORAL | Status: DC
  Administered 2019-03-25 – 2019-03-27 (×3): 324 mg via ORAL
  Filled 2019-03-25 (×3): qty 1

## 2019-03-25 MED ORDER — FAMOTIDINE 20 MG PO TABS: 20.0000 mg | ORAL_TABLET | Freq: Two times a day (BID) | ORAL | Status: DC

## 2019-03-25 MED ORDER — ENOXAPARIN SODIUM 40 MG/0.4ML ~~LOC~~ SOLN: 40.0000 mg | SUBCUTANEOUS | Status: DC

## 2019-03-25 MED ORDER — LISINOPRIL 20 MG PO TABS
20.0000 mg | ORAL_TABLET | Freq: Every day | ORAL | Status: DC
  Administered 2019-03-25 – 2019-03-27 (×3): 20 mg via ORAL
  Filled 2019-03-25 (×3): qty 1

## 2019-03-25 MED ORDER — VITAMIN D 25 MCG (1000 UNIT) PO TABS
1000.0000 [IU] | ORAL_TABLET | Freq: Every day | ORAL | Status: DC
  Administered 2019-03-25 – 2019-03-27 (×3): 1000 [IU] via ORAL
  Filled 2019-03-25 (×3): qty 1

## 2019-03-25 MED ORDER — ACETAMINOPHEN 325 MG PO TABS
650.0000 mg | ORAL_TABLET | Freq: Four times a day (QID) | ORAL | Status: DC | PRN
  Administered 2019-03-25: 22:00:00 650 mg via ORAL
  Filled 2019-03-25: qty 2

## 2019-03-25 MED ORDER — DEXAMETHASONE SODIUM PHOSPHATE 10 MG/ML IJ SOLN
6.0000 mg | INTRAMUSCULAR | Status: DC
  Administered 2019-03-26: 6 mg via INTRAVENOUS
  Filled 2019-03-25: qty 1

## 2019-03-25 MED ORDER — LORATADINE 10 MG PO TABS
10.0000 mg | ORAL_TABLET | Freq: Every day | ORAL | Status: DC
  Administered 2019-03-25 – 2019-03-27 (×3): 10 mg via ORAL
  Filled 2019-03-25 (×3): qty 1

## 2019-03-25 MED ORDER — SODIUM CHLORIDE 0.9 % IV SOLN: INTRAVENOUS | Status: DC

## 2019-03-25 MED ORDER — ONDANSETRON HCL 4 MG/2ML IJ SOLN: 4.0000 mg | Freq: Four times a day (QID) | INTRAMUSCULAR | Status: DC | PRN

## 2019-03-25 MED ORDER — PANTOPRAZOLE SODIUM 40 MG PO TBEC
40.0000 mg | DELAYED_RELEASE_TABLET | Freq: Every day | ORAL | Status: DC
  Administered 2019-03-25 – 2019-03-27 (×3): 40 mg via ORAL
  Filled 2019-03-25 (×3): qty 1

## 2019-03-25 MED ORDER — ASPIRIN EC 81 MG PO TBEC
81.0000 mg | DELAYED_RELEASE_TABLET | Freq: Every day | ORAL | Status: DC
  Administered 2019-03-25 – 2019-03-27 (×3): 81 mg via ORAL
  Filled 2019-03-25 (×3): qty 1

## 2019-03-25 MED ORDER — GUAIFENESIN-DM 100-10 MG/5ML PO SYRP
10.0000 mL | ORAL_SOLUTION | ORAL | Status: DC | PRN
  Filled 2019-03-25: qty 10

## 2019-03-25 MED ORDER — APIXABAN 5 MG PO TABS
5.0000 mg | ORAL_TABLET | Freq: Two times a day (BID) | ORAL | Status: DC
  Administered 2019-03-25 – 2019-03-27 (×5): 5 mg via ORAL
  Filled 2019-03-25 (×5): qty 1

## 2019-03-25 NOTE — H&P (Signed)
[place] at [place] NAME: [name] Browning    MR#:  [id number]  DATE OF BIRTH:  23-Jul-1940  DATE OF ADMISSION:  03/25/2019  PRIMARY CARE PHYSICIAN: Derinda Late, MD   REQUESTING/REFERRING PHYSICIAN: Hinda Kehr, MD  CHIEF COMPLAINT:   Chief Complaint  Patient presents with  . Shortness of Breath  . Cough    HISTORY OF PRESENT ILLNESS:  [name] Browning  is a 79 y.o. male with a known history of multiple medical problems that are mentioned below including COPD, hypertension, dyslipidemia and COVID-19 in January, presents to emergency room with acute onset of dyspnea with associated productive cough of sputum that is pink-tinged, with diminished pulse oximetry to 87% on 2 L of O2 home O2.  The patient admitted to nausea and vomiting this morning.  Apparently has been having posttussive vomiting.  He denies any abdominal pain or melena or bright red blood per rectum or other bleeding diathesis.  No chest pain or palpitations.  He admits to dyspnea on exertion.  Upon presenting to the emergency room, blood pressure was 142/75 with otherwise normal vital signs.  CMP revealed a BNP of 286 and LDH 172 with high-sensitivity troponin I of 37 ferritin of 270 with CRP of 1.1 and procalcitonin less than 0.1.  With unremarkable CBC.  Influenza antigens and COVID-19 PCR came back negative.EKG showed normal sinus rhythm with a rate of 71 with poor R progression.  5 enteritis D-dimer was 1032.84 and fibrinogen 538.  Blood cultures were drawn.  Chest CTA showed: 1. No evidence of acute pulmonary embolism. 2. There are features of peripheral ground-glass and interstitial opacities with admixed consolidation which could reflect a combination of residual atypical infection and edema as well as small bilateral pleural effusions with loculated components tracking in the fissures. These loculated collections appear to correspond to more nodular opacity seen on comparison radiograph. 3.  Mediastinal and hilar lymphadenopathy, favor reactive. 4. Background of centrilobular and paraseptal emphysema ( Emphysema (ICD10-J43.9).) 5. Extensive biapical pleuroparenchymal scarring with a more reticular appearance in the left apex centered upon a 1 cm nodule. This finding is nonspecific and could be postinflammatory in etiology. Recommend follow-up chest CT in 3 months. 6. Coronary artery calcifications. 7. Aortic Atherosclerosis (ICD10-I70.0)  The patient was given 10 mg of IV Decadron.  He will be admitted to medical monitored bed for further evaluation and management.  PAST MEDICAL HISTORY:   Past Medical History:  Diagnosis Date  . Anxiety   . Cancer ([place])    skin cancer on right ear and right forehead removed  . Carpal tunnel syndrome    Both wrists.  . Carpal tunnel syndrome   . COPD (chronic obstructive pulmonary disease) ([place])   . DVT (deep venous thrombosis) ([place])    taken off anticoagulation after about 6 months  . HLD (hyperlipidemia)   . Hypertension   . Kidney stones   . Leaky heart valve   . Normal coronary arteries    a. by cardiac cath in 2004; b. normal stress test in 2007 and 2011; c. Lexiscan 05/20/2014: no significant ischemia, EF 55-65%, no EKG changes, low risk study   . Pneumonia   . Stroke [facility])    TIA in 2016  . Syncope     PAST SURGICAL HISTORY:   Past Surgical History:  Procedure Laterality Date  . CARDIAC CATHETERIZATION  2004  . CARPAL TUNNEL RELEASE Right 06/11/2013  . SHOULDER ARTHROSCOPY WITH OPEN ROTATOR CUFF REPAIR Right  08/24/2017   Procedure: SHOULDER ARTHROSCOPY WITH OPEN ROTATOR CUFF REPAIR;  Surgeon: Corky Mull, MD;  Location: [facility] ORS;  Service: Orthopedics;  Laterality: Right;  . SKIN CANCER EXCISION  12/01/2016   right ear   . SKIN CANCER EXCISION     remove from the right side of the face   . THROMBECTOMY Right 2004   leg  . THYROIDECTOMY  1950   Not sure if total or partial thyroidectomy.    SOCIAL HISTORY:    Social History   Tobacco Use  . Smoking status: Former Smoker    Packs/day: 1.00    Years: 46.00    Pack years: 46.00    Types: Cigarettes    Start date: 01/10/1957    Quit date: 02/11/2003    Years since quitting: 16.1  . Smokeless tobacco: Former Systems developer    Types: Snuff    Quit date: 02/11/2003  Substance Use Topics  . Alcohol use: Yes    Alcohol/week: 7.0 standard drinks    Types: 7 Cans of beer per week    FAMILY HISTORY:   Family History  Problem Relation Age of Onset  . Hypertension Mother   . Heart disease Mother   . CAD Father   . Heart attack Father     DRUG ALLERGIES:   Allergies  Allergen Reactions  . Hydromorphone     Other reaction(s): Hallucination Pt reports he doesn't know about this    REVIEW OF SYSTEMS:   ROS As per history of present illness. All pertinent systems were reviewed above. Constitutional,  HEENT, cardiovascular, respiratory, GI, GU, musculoskeletal, neuro, psychiatric, endocrine,  integumentary and hematologic systems were reviewed and are otherwise  negative/unremarkable except for positive findings mentioned above in the HPI.   MEDICATIONS AT HOME:   Prior to Admission medications   Medication Sig Start Date End Date Taking? Authorizing Provider  apixaban (ELIQUIS) 5 MG TABS tablet Take 1 tablet (5 mg total) by mouth 2 (two) times daily. 02/11/19 03/25/19 Yes Enzo Bi, MD  aspirin EC 81 MG tablet Take 81 mg by mouth daily as needed.    Yes [provider]  cetirizine (ZYRTEC) 10 MG tablet Take 10 mg by mouth daily. 10/10/18  Yes [provider]  Cholecalciferol 125 MCG (5000 UT) capsule Take 1 capsule by mouth daily.   Yes [provider]  Ferrous Gluconate (IRON) 240 (27 Fe) MG TABS Take 1 tablet by mouth daily. 09/21/18  Yes [provider]  lisinopril (PRINIVIL,ZESTRIL) 20 MG tablet TAKE 1 TABLET BY MOUTH EVERY DAY 11/24/17  Yes Sowles, Drue Stager, MD  pantoprazole (PROTONIX) 40 MG tablet Take 40 mg  by mouth daily. 12/10/18  Yes [provider]  rosuvastatin (CRESTOR) 40 MG tablet Take 1 tablet (40 mg total) by mouth daily. 03/09/17  Yes Sowles, Drue Stager, MD  SYMBICORT 160-4.5 MCG/ACT inhaler Inhale 2 puffs into the lungs daily. 12/07/18  Yes [provider]      VITAL SIGNS:  Blood pressure (!) 142/75, pulse 71, temperature 98.2 F (36.8 C), temperature source Oral, resp. rate 17, height 6' 1.5" (1.867 m), weight 82.6 kg, SpO2 99 %.  PHYSICAL EXAMINATION:  Physical Exam  GENERAL:  79 y.o.-year-old Caucasian male patient lying in the bed with mild respiratory distress with conversational dyspnea.  EYES: Pupils equal, round, reactive to light and accommodation. No scleral icterus. Extraocular muscles intact.  HEENT: Head atraumatic, normocephalic. Oropharynx and nasopharynx clear.  NECK:  Supple, no jugular venous distention. No thyroid enlargement,  no tenderness.  LUNGS: Diminished bibasal breath sounds with bibasal crackles. CARDIOVASCULAR: Regular rate and rhythm, S1, S2 normal. No murmurs, rubs, or gallops.  ABDOMEN: Soft, nondistended, nontender. Bowel sounds present. No organomegaly or mass.  EXTREMITIES: No pedal edema, cyanosis, or clubbing.  NEUROLOGIC: Cranial nerves II through XII are intact. Muscle strength 5/5 in all extremities. Sensation intact. Gait not checked.  PSYCHIATRIC: The patient is alert and oriented x 3.  Normal affect and good eye contact. SKIN: No obvious rash, lesion, or ulcer.   LABORATORY PANEL:   CBC Recent Labs  Lab 03/25/19 0133  WBC 8.3  HGB 13.1  HCT 40.1  PLT 183   ------------------------------------------------------------------------------------------------------------------  Chemistries  Recent Labs  Lab 03/25/19 0133  NA 139  K 3.7  CL 106  CO2 26  GLUCOSE 109*  BUN 17  CREATININE 0.99  CALCIUM 8.7*  AST 31  ALT 31  ALKPHOS 79  BILITOT 0.9    ------------------------------------------------------------------------------------------------------------------  Cardiac Enzymes No results for input(s): TROPONINI in the last 168 hours. ------------------------------------------------------------------------------------------------------------------  RADIOLOGY:  CT Angio Chest PE W/Cm &/Or Wo Cm  Result Date: 03/25/2019 CLINICAL DATA:  History of DVT on Eliquis, COVID-19 infection 2 months prior EXAM: CT ANGIOGRAPHY CHEST WITH CONTRAST TECHNIQUE: Multidetector CT imaging of the chest was performed using the standard protocol during bolus administration of intravenous contrast. Multiplanar CT image reconstructions and MIPs were obtained to evaluate the vascular anatomy. CONTRAST:  52mL OMNIPAQUE IOHEXOL 350 MG/ML SOLN COMPARISON:  Chest radiograph 03/25/2019 FINDINGS: Cardiovascular: Satisfactory opacification the pulmonary arteries to the segmental level. No pulmonary artery filling defects are identified. Central pulmonary arteries are normal caliber. Mild cardiomegaly with predominantly right heart enlargement. Small volume of fluid is noted within the pericardial recesses. Coronary arteries are calcified. Atherosclerotic plaque within the normal caliber aorta. Normal 3 vessel branching of the aortic arch. Major venous structures are unremarkable. Mediastinum/Nodes: There are multiple enlarged mediastinal and hilar lymph nodes for instance a 1.4 cm subcarinal node (4/52), a 1.0 cm create carinal lymph node (4/37), and a 1.1 cm right hilar lymph node (4/57). No acute abnormality of the trachea or esophagus. Thyroid gland and thoracic inlet are unremarkable. Lungs/Pleura: Moderate centrilobular and paraseptal emphysematous changes are present throughout the lungs. There are small bilateral pleural effusions with some loculated fluid tracking within the fissures. There are some peripheral areas of mixed interstitial and ground-glass opacity with  minimal consolidation. Extensive biapical pleuroparenchymal scarring is noted including a more reticular appearance in the left apex centered upon a 1 cm nodule in the left upper lobe (6/23). Associated traction bronchiectasis is noted. Upper Abdomen: No acute abnormalities present in the visualized portions of the upper abdomen. Musculoskeletal: Multilevel degenerative changes are present in the imaged portions of the spine. No acute osseous abnormality or suspicious osseous lesion. The osseous structures appear diffusely demineralized which may limit detection of small or nondisplaced fractures. Review of the MIP images confirms the above findings. IMPRESSION: 1. No evidence of acute pulmonary embolism. 2. There are features of peripheral ground-glass and interstitial opacities with admixed consolidation which could reflect a combination of residual atypical infection and edema as well as small bilateral pleural effusions with loculated components tracking in the fissures. These loculated collections appear to correspond to more nodular opacity seen on comparison radiograph. 3. Mediastinal and hilar lymphadenopathy, favor reactive. 4. Background of centrilobular and paraseptal emphysema ( Emphysema (ICD10-J43.9).) 5. Extensive biapical pleuroparenchymal scarring with a more reticular appearance in the left apex centered upon a 1  cm nodule. This finding is nonspecific and could be postinflammatory in etiology. Recommend follow-up chest CT in 3 months. 6. Coronary artery calcifications. 7. Aortic Atherosclerosis (ICD10-I70.0) Electronically Signed   By: Lovena Le M.D.   On: 03/25/2019 03:22   DG Chest Port 1 View  Result Date: 03/25/2019 CLINICAL DATA:  Hypoxemia, positive for COVID-19 2 months prior EXAM: PORTABLE CHEST 1 VIEW COMPARISON:  Radiograph 02/08/2019 FINDINGS: Diffusely coarsened interstitial opacities with interspersed hazy opacity including more nodular opacity in the periphery of the right mid  to upper lung. No pneumothorax. No effusion. The cardiomediastinal contours are unremarkable. No acute osseous or soft tissue abnormality. Degenerative changes are present in the imaged spine and shoulders. Telemetry leads overlie the chest. IMPRESSION: Findings worrisome for recurrent or residual infection on a background of chronic interstitial disease. Alternatively, edema could have a similar appearance. Nodular opacities favored to be infectious or inflammatory though recommend 3 month follow-up imaging to assess for resolution. Electronically Signed   By: Lovena Le M.D.   On: 03/25/2019 01:49      IMPRESSION AND PLAN:   1.  Community-acquired pneumonia with subsequent acute hypoxic respiratory failure. -The patient was admitted to a medical monitored bed. -He will be placed on IV Rocephin and Zithromax. -We will follow sputum Gram stain culture and sensitivity as well as blood cultures. -His COVID-19 test came back negative. -Mucolytic therapy will be provided. -Duo nebs 4 times daily and every 4 hours as needed will be given.  2.  COPD with mild exacerbation contributing to his acute respiratory failure. -Patient will be placed on IV steroid therapy -Scheduled and as needed duo nebs will be provided as well as antibiotic therapy as mentioned above. -We will hold Symbicort.  3.  Hypertension. -Lisinopril will be resumed.  4.  Dyslipidemia. -Statin therapy will be resumed.  5.  GERD. -PPI therapy will be resumed.  6.  DVT prophylaxis. -Eliquis will be resumed.   All the records are reviewed and case discussed with ED provider. The plan of care was discussed in details with the patient (and family). I answered all questions. The patient agreed to proceed with the above mentioned plan. Further management will depend upon hospital course.   CODE STATUS: Full code  TOTAL TIME TAKING CARE OF THIS PATIENT: 55 minutes.    Christel Mormon M.D on 03/25/2019 at 6:59 AM  [facility]   From 7 PM-7 AM, contact night-coverage [email or web address]  CC: Primary care physician; Derinda Late, MD   Note: This dictation was prepared with Dragon dictation along with smaller phrase technology. Any transcriptional errors that result from this process are unintentional.

## 2019-03-25 NOTE — ED Notes (Signed)
Pt gave verbal consent to this RN to update family on condition. This RN called April, pt's granddaughter and gave an update on diagnosis and pt transfer to room 103.

## 2019-03-25 NOTE — ED Notes (Signed)
Provider at bedside

## 2019-03-25 NOTE — ED Notes (Addendum)
Pt arrived to room. Pt assisted to bed. Pt informed that RN Catalina Antigua would be coming soon. Pt shown where call bell is located if he needs something. Pt hooked to oxygen. Nurse Tech informed of pt in room and to let RN Catalina Antigua know to call with any questions. Nurse Tech also informed that the pt's room didn't have a Christmas tree but that I hooked the O2 to the wall O2. Per Secretary RN Matt still in another isolation room and she would let him know that pt has arrived. Also informed Secretary Ginger that Catalina Antigua can call me with any questions regarding the patient, ascom number given.

## 2019-03-25 NOTE — Progress Notes (Addendum)
Brief hospitalist update note This is a nonbillable note.  Please see scanned H&P for full billable details.  79 year old male with history of COPD presents with cough and shortness of breath.  Has a history of previous COVID-19 infection.  COVID-19 during this admission negative.  Suspected working diagnosis community-acquired pneumonia with associated underlying COPD.  We will continue current plan of treatment with antibiotics, steroids, nebulizers.  Titrate down oxygen as tolerated.  Vitals per unit protocol.  Supportive care.  Offered to call patient's family member for update.  Patient declined.  Ralene Muskrat MD

## 2019-03-25 NOTE — ED Notes (Addendum)
Called floor for pt's room assignment. Per Scientific laboratory technician RN to get this pt is in isolation room and will have to call back. Secretary given this RN's ascom number and informed that I would be bringing pt up in the next few minutes.

## 2019-03-25 NOTE — ED Provider Notes (Signed)
[facility] Emergency Department Provider Note  ____________________________________________   First MD Initiated Contact with Patient 03/25/19 0114     (approximate)  I have reviewed the triage vital signs and the nursing notes.   HISTORY  Chief Complaint Shortness of Breath and Cough    HPI [name] Browning is a 79 y.o. male with medical history as listed below which notably includes atrial fibrillation started on Eliquis 2 months ago, COVID-19 infection about 2 months ago, and prior history  of DVT and COPD.  He presents tonight by EMS for evaluation of gradually worsening shortness of breath and generalized weakness for an extended period of time (likely 4 weeks) but which seem to get worse today.  He said that he cannot walk more than a few steps without having to stop and rest and feeling very short of breath.  He said that occasionally has chest pains although not at this time.  He said that he is "just not been right" since having COVID-19 2 months ago.  He was admitted to the hospital at that time and spent several days in this hospital before he was discharged.  He does not have a baseline oxygen requirement.  Exertion makes his symptoms worse and nothing in particular makes them better.  He denies recent fever, sore throat, abdominal pain, nausea, vomiting, and diarrhea.  He has had more of a cough recently.        Past Medical History:  Diagnosis Date  . Anxiety   . Cancer ([place])    skin cancer on right ear and right forehead removed  . Carpal tunnel syndrome    Both wrists.  . Carpal tunnel syndrome   . COPD (chronic obstructive pulmonary disease) ([place])   . DVT (deep venous thrombosis) ([place])    taken off anticoagulation after about 6 months  . HLD (hyperlipidemia)   . Hypertension   . Kidney stones   . Leaky heart valve   . Normal coronary arteries    a. by cardiac cath in 2004; b. normal stress test in 2007 and 2011; c. Lexiscan 05/20/2014:  no significant ischemia, EF 55-65%, no EKG changes, low risk study   . Pneumonia   . Stroke [facility])    TIA in 2016  . Syncope     Patient Active Problem List   Diagnosis Date Noted  . Pneumonia 03/25/2019  . COVID-19 virus infection 02/08/2019  . New onset atrial fibrillation ([place]) 02/08/2019  . Chronic neck pain 03/09/2017  . Degenerative disc disease, cervical 03/09/2017  . Perennial allergic rhinitis 03/09/2017  . Hyperglycemia 09/01/2015  . Elevated rheumatoid factor 04/08/2015  . Arthritis of both hands 04/01/2015  . Bilateral hand pain 03/18/2015  . Elevated alkaline phosphatase level 03/02/2015  . Anxiety 02/16/2015  . Intermittent chest pain 02/16/2015  . History of TIA (transient ischemic attack) 05/18/2014  . HTN (hypertension) 05/18/2014  . Hyperlipidemia 05/18/2014  . Status post carpal tunnel release 06/26/2013    Past Surgical History:  Procedure Laterality Date  . CARDIAC CATHETERIZATION  2004  . CARPAL TUNNEL RELEASE Right 06/11/2013  . SHOULDER ARTHROSCOPY WITH OPEN ROTATOR CUFF REPAIR Right 08/24/2017   Procedure: SHOULDER ARTHROSCOPY WITH OPEN ROTATOR CUFF REPAIR;  Surgeon: Corky Mull, MD;  Location: [facility] ORS;  Service: Orthopedics;  Laterality: Right;  . SKIN CANCER EXCISION  12/01/2016   right ear   . SKIN CANCER EXCISION     remove from the right side of the face   .  THROMBECTOMY Right 2004   leg  . THYROIDECTOMY  1950   Not sure if total or partial thyroidectomy.    Prior to Admission medications   Medication Sig Start Date End Date Taking? Authorizing Provider  apixaban (ELIQUIS) 5 MG TABS tablet Take 1 tablet (5 mg total) by mouth 2 (two) times daily. 02/11/19 03/25/19 Yes Enzo Bi, MD  aspirin EC 81 MG tablet Take 81 mg by mouth daily as needed.    Yes [provider]  cetirizine (ZYRTEC) 10 MG tablet Take 10 mg by mouth daily. 10/10/18  Yes [provider]  Cholecalciferol 125 MCG (5000 UT) capsule Take 1 capsule by mouth  daily.   Yes [provider]  Ferrous Gluconate (IRON) 240 (27 Fe) MG TABS Take 1 tablet by mouth daily. 09/21/18  Yes [provider]  lisinopril (PRINIVIL,ZESTRIL) 20 MG tablet TAKE 1 TABLET BY MOUTH EVERY DAY 11/24/17  Yes Sowles, Drue Stager, MD  pantoprazole (PROTONIX) 40 MG tablet Take 40 mg by mouth daily. 12/10/18  Yes [provider]  rosuvastatin (CRESTOR) 40 MG tablet Take 1 tablet (40 mg total) by mouth daily. 03/09/17  Yes Sowles, Drue Stager, MD  SYMBICORT 160-4.5 MCG/ACT inhaler Inhale 2 puffs into the lungs daily. 12/07/18  Yes [provider]    Allergies Hydromorphone  Family History  Problem Relation Age of Onset  . Hypertension Mother   . Heart disease Mother   . CAD Father   . Heart attack Father     Social History Social History   Tobacco Use  . Smoking status: Former Smoker    Packs/day: 1.00    Years: 46.00    Pack years: 46.00    Types: Cigarettes    Start date: 01/10/1957    Quit date: 02/11/2003    Years since quitting: 16.1  . Smokeless tobacco: Former Systems developer    Types: Snuff    Quit date: 02/11/2003  Substance Use Topics  . Alcohol use: Yes    Alcohol/week: 7.0 standard drinks    Types: 7 Cans of beer per week  . Drug use: No    Review of Systems Constitutional: No fever/chills.  Generalized weakness. Eyes: No visual changes. ENT: No sore throat. Cardiovascular: " Sometimes" chest pain. Respiratory: Gradually worsening shortness of breath and cough, much worse with exertion. Gastrointestinal: No abdominal pain.  No nausea, no vomiting.  No diarrhea.  No constipation. Genitourinary: Negative for dysuria. Musculoskeletal: Negative for neck pain.  Negative for back pain. Integumentary: Negative for rash. Neurological: Negative for headaches, focal weakness or numbness.   ____________________________________________   PHYSICAL EXAM:  ED Triage Vitals  Enc Vitals Group     BP 03/25/19 0122 (!) 142/75     Pulse Rate  03/25/19 0122 71     Resp 03/25/19 0122 17     Temp 03/25/19 0122 98.2 F (36.8 C)     Temp Source 03/25/19 0122 Oral     SpO2 03/25/19 0122 99 %     Weight 03/25/19 0118 82.6 kg (182 lb)     Height 03/25/19 0118 1.867 m (6' 1.5")     Head Circumference --      Peak Flow --      Pain Score 03/25/19 0118 0     Pain Loc --      Pain Edu? --      Excl. in [place]? --      Constitutional: Alert and oriented. Breathing comfortably on 2L O2 [place]. Eyes: Conjunctivae are normal.  Head: Atraumatic. Nose: No congestion/rhinnorhea. Mouth/Throat: Patient is wearing a mask. Neck: No stridor.  No meningeal signs.   Cardiovascular: Normal rate and rhythm. Good peripheral circulation. Grossly normal heart sounds. Respiratory: Normal respiratory effort.  No retractions.  No wheezing, rales, nor rhonchi. Gastrointestinal: Soft and nontender. No distention.  Musculoskeletal: No lower extremity tenderness nor edema. No gross deformities of extremities. Neurologic:  Normal speech and language. No gross focal neurologic deficits are appreciated.  Skin:  Skin is warm, dry and intact. Psychiatric: Mood and affect are normal. Speech and behavior are normal.  ____________________________________________   LABS (all labs ordered are listed, but only abnormal results are displayed)  Labs Reviewed  BRAIN NATRIURETIC PEPTIDE - Abnormal; Notable for the following components:      Result Value   B Natriuretic Peptide 286.0 (*)    All other components within normal limits  COMPREHENSIVE METABOLIC PANEL - Abnormal; Notable for the following components:   Glucose, Bld 109 (*)    Calcium 8.7 (*)    All other components within normal limits  FIBRIN DERIVATIVES D-DIMER ([facility] ONLY) - Abnormal; Notable for the following components:   Fibrin derivatives D-dimer ([facility]) 1,032.84 (*)    All other components within normal limits  FIBRINOGEN - Abnormal; Notable for the following components:   Fibrinogen 538 (*)    All  other components within normal limits  TROPONIN I (HIGH SENSITIVITY) - Abnormal; Notable for the following components:   Troponin I (High Sensitivity) 37 (*)    All other components within normal limits  CULTURE, BLOOD (ROUTINE X 2)  CULTURE, BLOOD (ROUTINE X 2)  RESPIRATORY PANEL BY RT PCR (FLU A&B, COVID)  EXPECTORATED SPUTUM ASSESSMENT W REFEX TO RESP CULTURE  CBC WITH DIFFERENTIAL/PLATELET  PROCALCITONIN  LACTIC ACID, PLASMA  LACTATE DEHYDROGENASE  FERRITIN  TRIGLYCERIDES  C-REACTIVE PROTEIN  ABO/RH  TROPONIN I (HIGH SENSITIVITY)   ____________________________________________  EKG  ED ECG REPORT I, Hinda Kehr, the attending physician, personally viewed and interpreted this ECG.  Date: 03/25/2019 EKG Time: 1:39 Rate: 71 Rhythm: normal sinus rhythm QRS Axis: normal Intervals: normal ST/T Wave abnormalities: Non-specific ST segment / T-wave changes, but no clear evidence of acute ischemia. Narrative Interpretation: no definitive evidence of acute ischemia; does not meet STEMI criteria.   ____________________________________________  RADIOLOGY I, Hinda Kehr, personally viewed and evaluated these images (plain radiographs) as part of my medical decision making, as well as reviewing the written report by the radiologist.  ED MD interpretation:  Worsening infectious/COVID pattern on CXR.  CTA demonstrates no PE but extensive ground glass opacities.  Official radiology report(s): CT Angio Chest PE W/Cm &/Or Wo Cm  Result Date: 03/25/2019 CLINICAL DATA:  History of DVT on Eliquis, COVID-19 infection 2 months prior EXAM: CT ANGIOGRAPHY CHEST WITH CONTRAST TECHNIQUE: Multidetector CT imaging of the chest was performed using the standard protocol during bolus administration of intravenous contrast. Multiplanar CT image reconstructions and MIPs were obtained to evaluate the vascular anatomy. CONTRAST:  57mL OMNIPAQUE IOHEXOL 350 MG/ML SOLN COMPARISON:  Chest radiograph  03/25/2019 FINDINGS: Cardiovascular: Satisfactory opacification the pulmonary arteries to the segmental level. No pulmonary artery filling defects are identified. Central pulmonary arteries are normal caliber. Mild cardiomegaly with predominantly right heart enlargement. Small volume of fluid is noted within the pericardial recesses. Coronary arteries are calcified. Atherosclerotic plaque within the normal caliber aorta. Normal 3 vessel branching of the aortic arch. Major venous structures are unremarkable. Mediastinum/Nodes: There are multiple enlarged mediastinal and hilar lymph nodes for instance  a 1.4 cm subcarinal node (4/52), a 1.0 cm create carinal lymph node (4/37), and a 1.1 cm right hilar lymph node (4/57). No acute abnormality of the trachea or esophagus. Thyroid gland and thoracic inlet are unremarkable. Lungs/Pleura: Moderate centrilobular and paraseptal emphysematous changes are present throughout the lungs. There are small bilateral pleural effusions with some loculated fluid tracking within the fissures. There are some peripheral areas of mixed interstitial and ground-glass opacity with minimal consolidation. Extensive biapical pleuroparenchymal scarring is noted including a more reticular appearance in the left apex centered upon a 1 cm nodule in the left upper lobe (6/23). Associated traction bronchiectasis is noted. Upper Abdomen: No acute abnormalities present in the visualized portions of the upper abdomen. Musculoskeletal: Multilevel degenerative changes are present in the imaged portions of the spine. No acute osseous abnormality or suspicious osseous lesion. The osseous structures appear diffusely demineralized which may limit detection of small or nondisplaced fractures. Review of the MIP images confirms the above findings. IMPRESSION: 1. No evidence of acute pulmonary embolism. 2. There are features of peripheral ground-glass and interstitial opacities with admixed consolidation which could  reflect a combination of residual atypical infection and edema as well as small bilateral pleural effusions with loculated components tracking in the fissures. These loculated collections appear to correspond to more nodular opacity seen on comparison radiograph. 3. Mediastinal and hilar lymphadenopathy, favor reactive. 4. Background of centrilobular and paraseptal emphysema ( Emphysema (ICD10-J43.9).) 5. Extensive biapical pleuroparenchymal scarring with a more reticular appearance in the left apex centered upon a 1 cm nodule. This finding is nonspecific and could be postinflammatory in etiology. Recommend follow-up chest CT in 3 months. 6. Coronary artery calcifications. 7. Aortic Atherosclerosis (ICD10-I70.0) Electronically Signed   By: Lovena Le M.D.   On: 03/25/2019 03:22   DG Chest Port 1 View  Result Date: 03/25/2019 CLINICAL DATA:  Hypoxemia, positive for COVID-19 2 months prior EXAM: PORTABLE CHEST 1 VIEW COMPARISON:  Radiograph 02/08/2019 FINDINGS: Diffusely coarsened interstitial opacities with interspersed hazy opacity including more nodular opacity in the periphery of the right mid to upper lung. No pneumothorax. No effusion. The cardiomediastinal contours are unremarkable. No acute osseous or soft tissue abnormality. Degenerative changes are present in the imaged spine and shoulders. Telemetry leads overlie the chest. IMPRESSION: Findings worrisome for recurrent or residual infection on a background of chronic interstitial disease. Alternatively, edema could have a similar appearance. Nodular opacities favored to be infectious or inflammatory though recommend 3 month follow-up imaging to assess for resolution. Electronically Signed   By: Lovena Le M.D.   On: 03/25/2019 01:49    ____________________________________________   PROCEDURES   Procedure(s) performed (including Critical Care):  .Critical Care Performed by: Hinda Kehr, MD Authorized by: Hinda Kehr, MD   Critical  care provider statement:    Critical care time (minutes):  30   Critical care time was exclusive of:  Separately billable procedures and treating other patients   Critical care was necessary to treat or prevent imminent or life-threatening deterioration of the following conditions:  Respiratory failure (COVID-19 related hypoxemia requiring supplemental oxygen)   Critical care was time spent personally by me on the following activities:  Development of treatment plan with patient or surrogate, discussions with consultants, evaluation of patient's response to treatment, examination of patient, obtaining history from patient or surrogate, ordering and performing treatments and interventions, ordering and review of laboratory studies, ordering and review of radiographic studies, pulse oximetry, re-evaluation of patient's condition and review of old charts  ____________________________________________   INITIAL IMPRESSION / MDM / ASSESSMENT AND PLAN / ED COURSE  As part of my medical decision making, I reviewed the following data within the [place] notes reviewed and incorporated, Labs reviewed , EKG interpreted , Old chart reviewed, Radiograph reviewed , Discussed with admitting physician (Dr. Sidney Ace) and Notes from prior ED visits   Differential diagnosis includes, but is not limited to, COVID-19 exacerbation or reinfection versus persistent symptoms, COPD exacerbation, new diagnosis of heart failure, ACS, pulmonary embolism, bacterial pneumonia.  Patient is hypertensive per EMS but heart rate is within normal limits.  EMS reports that at home the patient walked a few steps with them and his oxygen saturation on room air dropped to 87%.  They put him on 2 L of oxygen by nasal cannula and he comes up to the 90s and was able to ambulate short distances but became very short of breath.  He previously was put on Eliquis for his atrial fibrillation back when it was first  diagnosed when he was in the hospital 2 months ago.  He says he has chest pain "sometimes" but inconsistently and it does not seem to be related to his acute issue tonight.  I am initiating a broad evaluation particular given his Covid diagnosis.  His lungs are clear with no wheezing, good air movement, and no coarse breath sounds so I think COPD exacerbation is unlikely.  The patient will require admission given his oxygen requirement but is unclear the exact etiology of his symptoms at this time.      Clinical Course as of Mar 25 423  Mon Mar 25, 2019  0222 Procalcitonin: <0.10 [CF]  0241 Strongly doubt bacterial pneumonia given no leukocytosis, no fever, normal procalcitonin.  This appears to be a reinfection or worsening of his Covid symptoms from his original infection 2 months ago.  I discussed the case by phone with Shanon Brow from pharmacy and we decided to hold off on remdesivir for now and defer to the hospitalist.  I have ordered Decadron 10 mg IV.  I have consulted the hospitalist for admission.   [CF]  [id number] While awaiting hospitalist callback, I have ordered a CTA chest to rule out PE as well as to get a better look at the lung parenchyma   [CF]  0259 I discussed the case with Dr. Normand Sloop in person.  He requested that I go ahead and order another Covid test which may help guide the patient's treatment while he is inpatient and I think that is reasonable even though he tested positive within the last 2 months.  I have ordered the rapid test to help guide therapy.  CTA chest is pending.   [CF]  0423 No PE, atypical infection is probable with existing scarring  CT Angio Chest PE W/Cm &/Or Wo Cm [CF]    Clinical Course User Index [CF] Hinda Kehr, MD     ____________________________________________  FINAL CLINICAL IMPRESSION(S) / ED DIAGNOSES  Final diagnoses:  COVID-19  Acute respiratory failure with hypoxemia ([place])     MEDICATIONS GIVEN DURING THIS VISIT:  Medications    aspirin EC tablet 81 mg (has no administration in time range)  rosuvastatin (CRESTOR) tablet 40 mg (has no administration in time range)  lisinopril (ZESTRIL) tablet 20 mg (has no administration in time range)  pantoprazole (PROTONIX) EC tablet 40 mg (has no administration in time range)  apixaban (ELIQUIS) tablet 5 mg (has no administration in time range)  Iron TABS  1 tablet (has no administration in time range)  Cholecalciferol 5,000 Units (has no administration in time range)  loratadine (CLARITIN) tablet 10 mg (has no administration in time range)  enoxaparin (LOVENOX) injection 40 mg (has no administration in time range)  0.9 %  sodium chloride infusion (has no administration in time range)  dexamethasone (DECADRON) injection 6 mg (has no administration in time range)  guaiFENesin-dextromethorphan (ROBITUSSIN DM) 100-10 MG/5ML syrup 10 mL (has no administration in time range)  chlorpheniramine-HYDROcodone (TUSSIONEX) 10-8 MG/5ML suspension 5 mL (has no administration in time range)  famotidine (PEPCID) tablet 20 mg (has no administration in time range)  acetaminophen (TYLENOL) tablet 650 mg (has no administration in time range)  traZODone (DESYREL) tablet 25 mg (has no administration in time range)  magnesium hydroxide (MILK OF MAGNESIA) suspension 30 mL (has no administration in time range)  ondansetron (ZOFRAN) tablet 4 mg (has no administration in time range)    Or  ondansetron (ZOFRAN) injection 4 mg (has no administration in time range)  dexamethasone (DECADRON) injection 10 mg (10 mg Intravenous Given 03/25/19 0248)  iohexol (OMNIPAQUE) 350 MG/ML injection 75 mL (75 mLs Intravenous Contrast Given 03/25/19 0300)     ED Discharge Orders    None      *Please note:  [name] Browning was evaluated in Emergency Department on 03/25/2019 for the symptoms described in the history of present illness. He was evaluated in the context of the global COVID-19 pandemic, which necessitated  consideration that the patient might be at risk for infection with the SARS-CoV-2 virus that causes COVID-19. Institutional protocols and algorithms that pertain to the evaluation of patients at risk for COVID-19 are in a state of rapid change based on information released by regulatory bodies including the CDC and federal and state organizations. These policies and algorithms were followed during the patient's care in the ED.  Some ED evaluations and interventions may be delayed as a result of limited staffing during the pandemic.*  Note:  This document was prepared using Dragon voice recognition software and may include unintentional dictation errors.   Hinda Kehr, MD 03/25/19 0425

## 2019-03-25 NOTE — ED Notes (Addendum)
Transporting pt to floor at this time. Pt going to room 103-1C

## 2019-03-25 NOTE — ED Notes (Signed)
Call from April, pt's "granddaughter" , please call at [phone] to update - this RN unable td/t care in other pt's room

## 2019-03-25 NOTE — ED Triage Notes (Addendum)
Pt here via [facility] from home with complaints of a productive cough and shortness of breath x1. Pt has also been experiencing progressive weakness. Pt states he was covid positive in January.   EMS found pt to be 96% on room air when resting, when pt ambulated sats dropped to 87% on room air. Pt is now at 98% on room air.  EMS VS- bp 180/100, hr 91.

## 2019-03-25 NOTE — ED Notes (Signed)
Pt transferred to room 103 by Sam, RN

## 2019-03-26 DIAGNOSIS — J9601 Acute respiratory failure with hypoxia: Secondary | ICD-10-CM

## 2019-03-26 DIAGNOSIS — J441 Chronic obstructive pulmonary disease with (acute) exacerbation: Secondary | ICD-10-CM

## 2019-03-26 LAB — COMPREHENSIVE METABOLIC PANEL
ALT: 26 U/L (ref 0–44)
AST: 26 U/L (ref 15–41)
Albumin: 3 g/dL — ABNORMAL LOW (ref 3.5–5.0)
Alkaline Phosphatase: 76 U/L (ref 38–126)
Anion gap: 7 (ref 5–15)
BUN: 25 mg/dL — ABNORMAL HIGH (ref 8–23)
CO2: 25 mmol/L (ref 22–32)
Calcium: 8.9 mg/dL (ref 8.9–10.3)
Chloride: 109 mmol/L (ref 98–111)
Creatinine, Ser: 0.87 mg/dL (ref 0.61–1.24)
GFR calc Af Amer: >60 mL/min (ref >60)
GFR calc non Af Amer: >60 mL/min (ref >60)
Glucose, Bld: 152 mg/dL — ABNORMAL HIGH (ref 70–99)
Potassium: 4.7 mmol/L (ref 3.5–5.1)
Sodium: 141 mmol/L (ref 135–145)
Total Bilirubin: 0.5 mg/dL (ref 0.3–1.2)
Total Protein: 6.4 g/dL — ABNORMAL LOW (ref 6.5–8.1)

## 2019-03-26 LAB — CBC WITH DIFFERENTIAL/PLATELET
Abs Immature Granulocytes: 0.05 10*3/uL (ref 0.00–0.07)
Basophils Absolute: 0 10*3/uL (ref 0.0–0.1)
Basophils Relative: 0 %
Eosinophils Absolute: 0 10*3/uL (ref 0.0–0.5)
Eosinophils Relative: 0 %
HCT: 35.9 % — ABNORMAL LOW (ref 39.0–52.0)
Hemoglobin: 11.8 g/dL — ABNORMAL LOW (ref 13.0–17.0)
Immature Granulocytes: 1 %
Lymphocytes Relative: 7 %
Lymphs Abs: 0.7 10*3/uL (ref 0.7–4.0)
MCH: 30.6 pg (ref 26.0–34.0)
MCHC: 32.9 g/dL (ref 30.0–36.0)
MCV: 93.2 fL (ref 80.0–100.0)
Monocytes Absolute: 0.3 10*3/uL (ref 0.1–1.0)
Monocytes Relative: 3 %
Neutro Abs: 8.9 10*3/uL — ABNORMAL HIGH (ref 1.7–7.7)
Neutrophils Relative %: 89 %
Platelets: 187 10*3/uL (ref 150–400)
RBC: 3.85 MIL/uL — ABNORMAL LOW (ref 4.22–5.81)
RDW: 13.1 % (ref 11.5–15.5)
WBC: 9.9 10*3/uL (ref 4.0–10.5)
nRBC: 0 % (ref 0.0–0.2)

## 2019-03-26 LAB — TROPONIN I (HIGH SENSITIVITY): Troponin I (High Sensitivity): 21 ng/L — ABNORMAL HIGH (ref <18)

## 2019-03-26 LAB — FERRITIN: Ferritin: 260 ng/mL (ref 24–336)

## 2019-03-26 LAB — C-REACTIVE PROTEIN: CRP: 2.6 mg/dL — ABNORMAL HIGH (ref <1.0)

## 2019-03-26 LAB — MAGNESIUM: Magnesium: 2.3 mg/dL (ref 1.7–2.4)

## 2019-03-26 LAB — FIBRIN DERIVATIVES D-DIMER (ARMC ONLY): Fibrin derivatives D-dimer (ARMC): 610.64 ng/mL (FEU) — ABNORMAL HIGH (ref 0.00–499.00)

## 2019-03-26 MED ORDER — AZITHROMYCIN 500 MG PO TABS
500.0000 mg | ORAL_TABLET | Freq: Every day | ORAL | Status: AC
  Administered 2019-03-27: 500 mg via ORAL
  Filled 2019-03-26: qty 1

## 2019-03-26 MED ORDER — METHYLPREDNISOLONE 4 MG PO TBPK
4.0000 mg | ORAL_TABLET | ORAL | Status: AC
  Administered 2019-03-26: 4 mg via ORAL

## 2019-03-26 MED ORDER — METHYLPREDNISOLONE 4 MG PO TBPK
8.0000 mg | ORAL_TABLET | Freq: Every morning | ORAL | Status: AC
  Administered 2019-03-26: 15:00:00 8 mg via ORAL
  Filled 2019-03-26: qty 21

## 2019-03-26 MED ORDER — BUDESONIDE 0.5 MG/2ML IN SUSP
0.5000 mg | Freq: Two times a day (BID) | RESPIRATORY_TRACT | Status: DC
  Administered 2019-03-26 – 2019-03-27 (×3): 0.5 mg via RESPIRATORY_TRACT
  Filled 2019-03-26 (×3): qty 2

## 2019-03-26 MED ORDER — METHYLPREDNISOLONE 4 MG PO TBPK
4.0000 mg | ORAL_TABLET | ORAL | Status: AC
  Administered 2019-03-26: 18:00:00 4 mg via ORAL

## 2019-03-26 MED ORDER — METHYLPREDNISOLONE 4 MG PO TBPK
4.0000 mg | ORAL_TABLET | Freq: Three times a day (TID) | ORAL | Status: DC
  Administered 2019-03-27 (×2): 4 mg via ORAL

## 2019-03-26 MED ORDER — METHYLPREDNISOLONE 4 MG PO TBPK: 4.0000 mg | ORAL_TABLET | Freq: Four times a day (QID) | ORAL | Status: DC

## 2019-03-26 MED ORDER — METHYLPREDNISOLONE 4 MG PO TBPK
8.0000 mg | ORAL_TABLET | Freq: Every evening | ORAL | Status: AC
  Administered 2019-03-26: 8 mg via ORAL

## 2019-03-26 MED ORDER — METHYLPREDNISOLONE 4 MG PO TBPK: 8.0000 mg | ORAL_TABLET | Freq: Every evening | ORAL | Status: DC

## 2019-03-26 MED ORDER — ARFORMOTEROL TARTRATE 15 MCG/2ML IN NEBU
15.0000 ug | INHALATION_SOLUTION | Freq: Two times a day (BID) | RESPIRATORY_TRACT | Status: DC
  Administered 2019-03-26 – 2019-03-27 (×3): 15 ug via RESPIRATORY_TRACT
  Filled 2019-03-26 (×6): qty 2

## 2019-03-26 NOTE — Evaluation (Signed)
Occupational Therapy Evaluation Patient Details Name: [name] Browning MRN: [id number] DOB: 03/18/1940 Today's Date: 03/26/2019    History of Present Illness 79 year old male with history of COPD presents with cough and shortness of breath.  Has a history of previous COVID-19 infection.  COVID-19 during this admission negative.  Suspected working diagnosis community-acquired pneumonia with associated underlying COPD.   Clinical Impression   Pt was seen for OT evaluation this date. Prior to hospital admission, pt was Indep with ADLs/IADLs including working out in the yard. He does endorse that some [facility] tasks with his R hand have been difficult for him and states that just before hospitalization, that higher level [place] tasks would get him short of breath. Pt lives with his ex-spouse and two grand children (one of the grand children is not there at all times). Currently pt demonstrates impairments as described below (See OT problem list) which functionally limit his ability to perform ADL/self-care tasks. Pt currently requires MIN A to CGA for ADL transfer with RW and requires occasional assist with some FM tasks with R hand.  Pt would benefit from skilled OT to address noted impairments and functional limitations (see below for any additional details) in order to maximize safety and independence. Upon hospital discharge, recommend HHOT vs Outpt OT to maximize pt [place], safety, activity tolerance and return to functional independence during meaningful occupations of daily life.     Follow Up Recommendations  Home health OT(HH versus outpt if pt has transportation.)    Equipment Recommendations  Other (comment);Tub/shower seat(RW)    Recommendations for Other Services       Precautions / Restrictions Precautions Precautions: Fall Precaution Comments: watch O2 Restrictions Weight Bearing Restrictions: No      Mobility Bed Mobility Overal bed mobility: Needs Assistance Bed Mobility: Supine to  Sit     Supine to sit: Supervision     General bed mobility comments: pt up in chair when OT presents  Transfers Overall transfer level: Needs assistance Equipment used: Rolling walker (2 wheeled);None Transfers: Sit to/from Stand Sit to Stand: Min guard;Min assist         General transfer comment: MIN verbal cues for safe hand placement with RW on sit<>stand    Balance Overall balance assessment: Mild deficits observed, not formally tested                                         ADL either performed or assessed with clinical judgement   ADL                                         General ADL Comments: Pt requires setup only for seated UB ADLs, requires MIN A for seated LB dressing to don R sock, states that side is slightly less flexible at baseline-potentially d/t OA. MIN A to CGA with ADL transfers with RW     Vision Patient Visual Report: No change from baseline       Perception     Praxis      Pertinent Vitals/Pain Pain Assessment: No/denies pain     Hand Dominance Right   Extremity/Trunk Assessment Upper Extremity Assessment Upper Extremity Assessment: RUE deficits/detail;LUE deficits/detail RUE Deficits / Details: hx R shld rotator cuff sx, only completes 3/4 arc of motion and has some pain  with this, elbow and grip MMT 4-/5, difficulty opposing thumb to pinky. Reports that he was using R hand well after carpal tunnel sx, but feels his coordination with R hand has somewhat decreased since RUE Coordination: decreased fine motor LUE Deficits / Details: shld, elbow, grip MMT 4/5   Lower Extremity Assessment Lower Extremity Assessment: Defer to PT evaluation;Generalized weakness   Cervical / Trunk Assessment Cervical / Trunk Assessment: Normal   Communication Communication Communication: No difficulties   Cognition Arousal/Alertness: Awake/alert Behavior During Therapy: WFL for tasks assessed/performed Overall  Cognitive Status: Within Functional Limits for tasks assessed                                     General Comments       Exercises Other Exercises Other Exercises: OT facilitates educaiton re: role of OT in acute setting. Pt somewhat familiar with OT d/t outpt OT after carpal tunnel release sx years ago. Other Exercises: OT facilitates education re: safe use of RW for ADL transfers. Pt demos good understanding.   Shoulder Instructions      Home Living Family/patient expects to be discharged to:: Private residence Living Arrangements: Spouse/significant other Available Help at Discharge: Family;Available 24 hours/day Type of Home: House Home Access: Stairs to enter [employer] of Steps: 2+1 Entrance Stairs-Rails: Can reach both Home Layout: One level     Bathroom Shower/Tub: Teacher, early years/pre: Standard     Home Equipment: Toilet riser   Additional Comments: Pt has wife's DME but none of his own      Prior Functioning/Environment Level of Independence: Independent        Comments: Pt works in yard, independent. recently has had decreased tolerance/endurance        OT Problem List: Decreased strength;Decreased range of motion;Decreased activity tolerance;Impaired balance (sitting and/or standing);Cardiopulmonary status limiting activity;Decreased coordination      OT Treatment/Interventions: Self-care/ADL training;Therapeutic exercise;Energy conservation;DME and/or AE instruction;Therapeutic activities;Patient/family education;Balance training    OT Goals(Current goals can be found in the care plan section) Acute Rehab OT Goals Patient Stated Goal: Pt wants to get his strength back OT Goal Formulation: With patient Time For Goal Achievement: 04/09/19 Potential to Achieve Goals: Good  OT Frequency: Min 2X/week   Barriers to D/C:            Co-evaluation              AM-PAC OT "6 Clicks" Daily Activity      Outcome Measure Help from another person eating meals?: None Help from another person taking care of personal grooming?: A Little Help from another person toileting, which includes using toliet, bedpan, or urinal?: A Little Help from another person bathing (including washing, rinsing, drying)?: A Little Help from another person to put on and taking off regular upper body clothing?: A Little(MIN A for buttons) Help from another person to put on and taking off regular lower body clothing?: A Little 6 Click Score: 19   End of Session Equipment Utilized During Treatment: Gait belt;Rolling walker Nurse Communication: Mobility status  Activity Tolerance: Patient tolerated treatment well Patient left: in chair;with call bell/phone within reach;with chair alarm set  OT Visit Diagnosis: Unsteadiness on feet (R26.81);Muscle weakness (generalized) (M62.81)                Time: [id number] OT Time Calculation (min): 23 min Charges:  OT General Charges $OT Visit: 1  Visit OT Evaluation $OT Eval Low Complexity: 1 Low OT Treatments $Self Care/Home Management : 8-22 mins  Gerrianne Scale, MS, OTR/L ascom [phone] 03/26/19, 5:10 PM

## 2019-03-26 NOTE — Evaluation (Signed)
Physical Therapy Evaluation Patient Details Name: [name] Browning MRN: [id number] DOB: 11/10/1940 Today's Date: 03/26/2019   History of Present Illness  79 year old male with history of COPD presents with cough and shortness of breath.  Has a history of previous COVID-19 infection.  COVID-19 during this admission negative.  Suspected working diagnosis community-acquired pneumonia with associated underlying COPD.    Clinical Impression  Pt alert, oriented, family at bedside. The patient stated since his bout with Covid19, he has had more difficulty with SOB, activity tolerance, and endurance. Pt is still independent/modI with ADLs, ambulates without AD, denies any falls.   The patient was able to perform supine to sit with supervision, extended time need and effortful for pt. Cued for breathing due to pt flushing with some improvement. Sit<>stand several times during session, with and without RW. Cued for hand placement with walker with fair carryover. The patient ambulated ~76ft with IV pole, and around 65ft with RW and CGA. Pt cued for 2-3 standing rest breaks due to SOB, spO2 readings 87-94% on room air. Decreased SOB and tremulous movements with RW but pt tended to lift RW to pivot. Pt back in room in chair with all needs in reach.  Overall the patient demonstrated deficits (see "PT Problem List") that impede the patient's functional abilities, safety, and mobility and would benefit from skilled PT intervention. Recommendation is HHPT with intermittent supervision.     Follow Up Recommendations Home health PT;Supervision - Intermittent    Equipment Recommendations  Rolling walker with 5" wheels    Recommendations for Other Services OT consult     Precautions / Restrictions Precautions Precautions: Fall Precaution Comments: watch O2 Restrictions Weight Bearing Restrictions: No      Mobility  Bed Mobility Overal bed mobility: Needs Assistance Bed Mobility: Supine to Sit     Supine  to sit: Supervision     General bed mobility comments: extended time needed, very effortful for patient. Pt does turn red, holds his breath, improvement noted with cueing  Transfers Overall transfer level: Needs assistance Equipment used: Rolling walker (2 wheeled);None Transfers: Sit to/from Stand Sit to Stand: Min guard         General transfer comment: cues for hand placement with use of RW, able to complete without AD but increased safety noted with RW.  Ambulation/Gait   Gait Distance (Feet): (29ft + 56ft) Assistive device: Rolling walker (2 wheeled);IV Pole   Gait velocity: decreased   General Gait Details: Pt able to utilize IV pole and RW. Did tend to lift RW with turns, instructed in proper use. Seemed to improve pt SOB with second trial of ambulation  Stairs            Wheelchair Mobility    Modified Rankin (Stroke Patients Only)       Balance Overall balance assessment: Mild deficits observed, not formally tested                                           Pertinent Vitals/Pain Pain Assessment: No/denies pain    Home Living Family/patient expects to be discharged to:: Private residence Living Arrangements: Spouse/significant other Available Help at Discharge: Family;Available 24 hours/day Type of Home: House Home Access: Stairs to enter Entrance Stairs-Rails: Can reach both Entrance Stairs-Number of Steps: 2+1 Home Layout: One level Home Equipment: Toilet riser Additional Comments: Pt has wife's DME but none of his  own    Prior Function Level of Independence: Independent         Comments: Pt works in yard, independent. recently has had decreased tolerance/endurance     Hand Dominance   Dominant Hand: Right    Extremity/Trunk Assessment   Upper Extremity Assessment Upper Extremity Assessment: Overall WFL for tasks assessed    Lower Extremity Assessment Lower Extremity Assessment: Generalized weakness     Cervical / Trunk Assessment Cervical / Trunk Assessment: Normal  Communication   Communication: No difficulties  Cognition Arousal/Alertness: Awake/alert Behavior During Therapy: WFL for tasks assessed/performed Overall Cognitive Status: Within Functional Limits for tasks assessed                                        General Comments      Exercises     Assessment/Plan    PT Assessment Patient needs continued PT services  PT Problem List Decreased strength;Decreased mobility;Decreased activity tolerance;Decreased balance;Cardiopulmonary status limiting activity       PT Treatment Interventions DME instruction;Therapeutic activities;Gait training;Therapeutic exercise;Stair training;Balance training;Functional mobility training;Neuromuscular re-education;Patient/family education    PT Goals (Current goals can be found in the Care Plan section)  Acute Rehab PT Goals Patient Stated Goal: Pt wants to get his strength back PT Goal Formulation: With patient Time For Goal Achievement: 04/09/19 Potential to Achieve Goals: Good    Frequency Min 2X/week   Barriers to discharge        Co-evaluation               AM-PAC PT "6 Clicks" Mobility  Outcome Measure Help needed turning from your back to your side while in a flat bed without using bedrails?: A Little Help needed moving from lying on your back to sitting on the side of a flat bed without using bedrails?: A Little Help needed moving to and from a bed to a chair (including a wheelchair)?: A Little Help needed standing up from a chair using your arms (e.g., wheelchair or bedside chair)?: A Little Help needed to walk in hospital room?: A Little Help needed climbing 3-5 steps with a railing? : A Little 6 Click Score: 18    End of Session Equipment Utilized During Treatment: Gait belt Activity Tolerance: Patient tolerated treatment well Patient left: with call bell/phone within reach;in chair;with  chair alarm set Nurse Communication: Mobility status PT Visit Diagnosis: Other abnormalities of gait and mobility (R26.89);Muscle weakness (generalized) (M62.81)    Time: 1350-1430 PT Time Calculation (min) (ACUTE ONLY): 40 min   Charges:   PT Evaluation $PT Eval Moderate Complexity: 1 Mod PT Treatments $Gait Training: 8-22 mins $Therapeutic Exercise: 8-22 mins        Lieutenant Diego PT, DPT 2:49 PM,03/26/19

## 2019-03-26 NOTE — Progress Notes (Signed)
PROGRESS NOTE    [name] Browning  [id number] DOB: 08/22/1940 DOA: 03/25/2019 PCP: Derinda Late, MD    Brief Narrative:  79 year old male with history of COPD presents with cough and shortness of breath.  Has a history of previous COVID-19 infection.  COVID-19 during this admission negative.  Suspected working diagnosis community-acquired pneumonia with associated underlying COPD.  We will continue current plan of treatment with antibiotics, steroids, nebulizers.   3/16: Patient seen and examined.  Continues to improve clinically.  Remains on 1 to 1.5 L nasal cannula.  Still with some mild shortness of breath improving over interval.   Assessment & Plan:   Active Problems:   Pneumonia  Community-acquired pneumonia acute hypoxic respiratory failure Patient responded clinically over interval All cultures no growth to date COVID-19 test negative Plan: Continue IV Rocephin, day 2 Continue IV Zithromax, day 2 Continue to follow cultures, no growth to date Mucolytic DuoNebs Patient supplemental oxygen as tolerated If Patient weaned to room air anticipate discharge on 03/27/2019  COPD with mild exacerbation  Clinically responding over interval Plan: DC IV steroids Start Medrol Dosepak taper DuoNebs as above Mucolytic's as above Wean supplemental oxygen as tolerated If Patient can be weaned to room air anticipate discharge on 03/27/2018  Hypertension. Continue home lisinopril  Dyslipidemia. Continue home statin  GERD. Continue PPI  Atrial fibrillation No rate control agents Continue home Eliquis    DVT prophylaxis: Eliquis Code Status: Full Family Communication: Granddaughter April [phone] on 3/16 disposition Plan: Anticipate return to home environment.  If patient is able to be weaned off supplemental oxygen I anticipate discharge home on 03/27/2019.  Consults placed for PT and OT   Consultants:   none  Procedures:  none  Antimicrobials:    Rocephin (3/15-  )  Azithromycin (3/15-  )   Subjective: Seen and examined Clinically improving Remains on 1L [place]  Objective: Vitals:   03/25/19 1554 03/25/19 2334 03/26/19 0916 03/26/19 1121  BP:  140/66 (!) 146/67   Pulse:  68    Resp:  18    Temp: 97.7 F (36.5 C) 97.6 F (36.4 C) 97.6 F (36.4 C)   TempSrc: Oral Oral Axillary   SpO2:  96%  93%  Weight:      Height:        Intake/Output Summary (Last 24 hours) at 03/26/2019 1153 Last data filed at 03/26/2019 0957 Gross per 24 hour  Intake 2352.32 ml  Output 1700 ml  Net 652.32 ml   Filed Weights   03/25/19 0118  Weight: 82.6 kg    Examination:  General exam: Appears calm and comfortable  Respiratory system: Lung sounds decreased at bases, scattered crackles, normal WOB Cardiovascular system: S1 & S2 heard, RRR. No JVD, murmurs, rubs, gallops or clicks. No pedal edema. Gastrointestinal system: Abdomen is nondistended, soft and nontender. No organomegaly or masses felt. Normal bowel sounds heard. Central nervous system: Alert and oriented. No focal neurological deficits. Extremities: Symmetric 5 x 5 power. Skin: No rashes, lesions or ulcers Psychiatry: Judgement and insight appear normal. Mood & affect appropriate.     Data Reviewed: I have personally reviewed following labs and imaging studies  CBC: Recent Labs  Lab 03/25/19 0133 03/26/19 0602  WBC 8.3 9.9  NEUTROABS 6.0 8.9*  HGB 13.1 11.8*  HCT 40.1 35.9*  MCV 93.0 93.2  PLT 183 [id number]   Basic Metabolic Panel: Recent Labs  Lab 03/25/19 0133 03/26/19 0602  NA 139 141  K 3.7 4.7  CL 106  109  CO2 26 25  GLUCOSE 109* 152*  BUN 17 25*  CREATININE 0.99 0.87  CALCIUM 8.7* 8.9  MG  --  2.3   GFR: Estimated Creatinine Clearance: 81.6 mL/min (by C-G formula based on SCr of 0.87 mg/dL). Liver Function Tests: Recent Labs  Lab 03/25/19 0133 03/26/19 0602  AST 31 26  ALT 31 26  ALKPHOS 79 76  BILITOT 0.9 0.5  PROT 7.1 6.4*  ALBUMIN 3.6  3.0*   No results for input(s): LIPASE, AMYLASE in the last 168 hours. No results for input(s): AMMONIA in the last 168 hours. Coagulation Profile: No results for input(s): INR, PROTIME in the last 168 hours. Cardiac Enzymes: No results for input(s): CKTOTAL, CKMB, CKMBINDEX, TROPONINI in the last 168 hours. BNP (last 3 results) No results for input(s): PROBNP in the last 8760 hours. HbA1C: No results for input(s): HGBA1C in the last 72 hours. CBG: No results for input(s): GLUCAP in the last 168 hours. Lipid Profile: Recent Labs    03/25/19 0133  TRIG 78   Thyroid Function Tests: No results for input(s): TSH, T4TOTAL, FREET4, T3FREE, THYROIDAB in the last 72 hours. Anemia Panel: Recent Labs    03/25/19 0133 03/26/19 0602  FERRITIN 270 260   Sepsis Labs: Recent Labs  Lab 03/25/19 0133  PROCALCITON <0.10  LATICACIDVEN 1.1    Recent Results (from the past 240 hour(s))  Blood Culture (routine x 2)     Status: None (Preliminary result)   Collection Time: 03/25/19  1:35 AM   Specimen: BLOOD  Result Value Ref Range Status   Specimen Description BLOOD LEFT ANTECUBITAL  Final   Special Requests   Final    BOTTLES DRAWN AEROBIC AND ANAEROBIC Blood Culture results may not be optimal due to an excessive volume of blood received in culture bottles   Culture   Final    NO GROWTH 1 DAY Performed at [facility], [address], [place], [place] [address]    Report Status PENDING  Incomplete  Blood Culture (routine x 2)     Status: None (Preliminary result)   Collection Time: 03/25/19  1:35 AM   Specimen: BLOOD  Result Value Ref Range Status   Specimen Description BLOOD RIGHT ANTECUBITAL  Final   Special Requests   Final    BOTTLES DRAWN AEROBIC AND ANAEROBIC Blood Culture results may not be optimal due to an excessive volume of blood received in culture bottles   Culture   Final    NO GROWTH 1 DAY Performed at [facility], [address],  [place], [place] [address]    Report Status PENDING  Incomplete  Respiratory Panel by RT PCR (Flu A&B, Covid) - Nasopharyngeal Swab     Status: None   Collection Time: 03/25/19  3:21 AM   Specimen: Nasopharyngeal Swab  Result Value Ref Range Status   SARS Coronavirus 2 by RT PCR NEGATIVE NEGATIVE Final    Comment: (NOTE) SARS-CoV-2 target nucleic acids are NOT DETECTED. The SARS-CoV-2 RNA is generally detectable in upper respiratoy specimens during the acute phase of infection. The lowest concentration of SARS-CoV-2 viral copies this assay can detect is 131 copies/mL. A negative result does not preclude SARS-Cov-2 infection and should not be used as the sole basis for treatment or other patient management decisions. A negative result may occur with  improper specimen collection/handling, submission of specimen other than nasopharyngeal swab, presence of viral mutation(s) within the areas targeted by this assay, and inadequate number of  viral copies (<131 copies/mL). A negative result must be combined with clinical observations, patient history, and epidemiological information. The expected result is Negative. Fact Sheet for Patients:  PinkCheek.be Fact Sheet for Healthcare Providers:  GravelBags.it This test is not yet ap proved or cleared by the [place] FDA and  has been authorized for detection and/or diagnosis of SARS-CoV-2 by FDA under an Emergency Use Authorization (EUA). This EUA will remain  in effect (meaning this test can be used) for the duration of the COVID-19 declaration under Section 564(b)(1) of the Act, 21 U.S.C. section 360bbb-3(b)(1), unless the authorization is terminated or revoked sooner.    Influenza A by PCR NEGATIVE NEGATIVE Final   Influenza B by PCR NEGATIVE NEGATIVE Final    Comment: (NOTE) The Xpert Xpress SARS-CoV-2/FLU/RSV assay is intended as an aid in  the diagnosis of influenza from  Nasopharyngeal swab specimens and  should not be used as a sole basis for treatment. Nasal washings and  aspirates are unacceptable for Xpert Xpress SARS-CoV-2/FLU/RSV  testing. Fact Sheet for Patients: PinkCheek.be Fact Sheet for Healthcare Providers: GravelBags.it This test is not yet approved or cleared by the [place] FDA and  has been authorized for detection and/or diagnosis of SARS-CoV-2 by  FDA under an Emergency Use Authorization (EUA). This EUA will remain  in effect (meaning this test can be used) for the duration of the  Covid-19 declaration under Section 564(b)(1) of the Act, 21  U.S.C. section 360bbb-3(b)(1), unless the authorization is  terminated or revoked. Performed at [facility], [address] [place], [place] [address]          Radiology Studies: CT Angio Chest PE W/Cm &/Or Wo Cm  Result Date: 03/25/2019 CLINICAL DATA:  History of DVT on Eliquis, COVID-19 infection 2 months prior EXAM: CT ANGIOGRAPHY CHEST WITH CONTRAST TECHNIQUE: Multidetector CT imaging of the chest was performed using the standard protocol during bolus administration of intravenous contrast. Multiplanar CT image reconstructions and MIPs were obtained to evaluate the vascular anatomy. CONTRAST:  92mL OMNIPAQUE IOHEXOL 350 MG/ML SOLN COMPARISON:  Chest radiograph 03/25/2019 FINDINGS: Cardiovascular: Satisfactory opacification the pulmonary arteries to the segmental level. No pulmonary artery filling defects are identified. Central pulmonary arteries are normal caliber. Mild cardiomegaly with predominantly right heart enlargement. Small volume of fluid is noted within the pericardial recesses. Coronary arteries are calcified. Atherosclerotic plaque within the normal caliber aorta. Normal 3 vessel branching of the aortic arch. Major venous structures are unremarkable. Mediastinum/Nodes: There are multiple enlarged mediastinal and  hilar lymph nodes for instance a 1.4 cm subcarinal node (4/52), a 1.0 cm create carinal lymph node (4/37), and a 1.1 cm right hilar lymph node (4/57). No acute abnormality of the trachea or esophagus. Thyroid gland and thoracic inlet are unremarkable. Lungs/Pleura: Moderate centrilobular and paraseptal emphysematous changes are present throughout the lungs. There are small bilateral pleural effusions with some loculated fluid tracking within the fissures. There are some peripheral areas of mixed interstitial and ground-glass opacity with minimal consolidation. Extensive biapical pleuroparenchymal scarring is noted including a more reticular appearance in the left apex centered upon a 1 cm nodule in the left upper lobe (6/23). Associated traction bronchiectasis is noted. Upper Abdomen: No acute abnormalities present in the visualized portions of the upper abdomen. Musculoskeletal: Multilevel degenerative changes are present in the imaged portions of the spine. No acute osseous abnormality or suspicious osseous lesion. The osseous structures appear diffusely demineralized which may limit detection of small or nondisplaced fractures. Review of  the MIP images confirms the above findings. IMPRESSION: 1. No evidence of acute pulmonary embolism. 2. There are features of peripheral ground-glass and interstitial opacities with admixed consolidation which could reflect a combination of residual atypical infection and edema as well as small bilateral pleural effusions with loculated components tracking in the fissures. These loculated collections appear to correspond to more nodular opacity seen on comparison radiograph. 3. Mediastinal and hilar lymphadenopathy, favor reactive. 4. Background of centrilobular and paraseptal emphysema ( Emphysema (ICD10-J43.9).) 5. Extensive biapical pleuroparenchymal scarring with a more reticular appearance in the left apex centered upon a 1 cm nodule. This finding is nonspecific and could be  postinflammatory in etiology. Recommend follow-up chest CT in 3 months. 6. Coronary artery calcifications. 7. Aortic Atherosclerosis (ICD10-I70.0) Electronically Signed   By: Lovena Le M.D.   On: 03/25/2019 03:22   DG Chest Port 1 View  Result Date: 03/25/2019 CLINICAL DATA:  Hypoxemia, positive for COVID-19 2 months prior EXAM: PORTABLE CHEST 1 VIEW COMPARISON:  Radiograph 02/08/2019 FINDINGS: Diffusely coarsened interstitial opacities with interspersed hazy opacity including more nodular opacity in the periphery of the right mid to upper lung. No pneumothorax. No effusion. The cardiomediastinal contours are unremarkable. No acute osseous or soft tissue abnormality. Degenerative changes are present in the imaged spine and shoulders. Telemetry leads overlie the chest. IMPRESSION: Findings worrisome for recurrent or residual infection on a background of chronic interstitial disease. Alternatively, edema could have a similar appearance. Nodular opacities favored to be infectious or inflammatory though recommend 3 month follow-up imaging to assess for resolution. Electronically Signed   By: Lovena Le M.D.   On: 03/25/2019 01:49        Scheduled Meds: . apixaban  5 mg Oral BID  . arformoterol  15 mcg Nebulization BID  . aspirin EC  81 mg Oral Daily  . [START ON 03/27/2019] azithromycin  500 mg Oral Q0600  . budesonide (PULMICORT) nebulizer solution  0.5 mg Nebulization BID  . cholecalciferol  1,000 Units Oral Daily  . dexamethasone (DECADRON) injection  6 mg Intravenous Q24H  . ferrous gluconate  324 mg Oral Daily  . lisinopril  20 mg Oral Daily  . loratadine  10 mg Oral Daily  . pantoprazole  40 mg Oral Daily  . rosuvastatin  40 mg Oral Daily   Continuous Infusions: . sodium chloride 75 mL/hr at 03/26/19 0957  . cefTRIAXone (ROCEPHIN)  IV Stopped (03/26/19 0649)     LOS: 1 day    Time spent: 35 minutes    Sidney Ace, MD [facility] Pager [phone]  If  7PM-7AM, please contact night-coverage 03/26/2019, 11:53 AM

## 2019-03-27 LAB — COMPREHENSIVE METABOLIC PANEL
ALT: 25 U/L (ref 0–44)
AST: 23 U/L (ref 15–41)
Albumin: 3.1 g/dL — ABNORMAL LOW (ref 3.5–5.0)
Alkaline Phosphatase: 69 U/L (ref 38–126)
Anion gap: 6 (ref 5–15)
BUN: 25 mg/dL — ABNORMAL HIGH (ref 8–23)
CO2: 27 mmol/L (ref 22–32)
Calcium: 8.8 mg/dL — ABNORMAL LOW (ref 8.9–10.3)
Chloride: 109 mmol/L (ref 98–111)
Creatinine, Ser: 0.8 mg/dL (ref 0.61–1.24)
GFR calc Af Amer: >60 mL/min (ref >60)
GFR calc non Af Amer: >60 mL/min (ref >60)
Glucose, Bld: 158 mg/dL — ABNORMAL HIGH (ref 70–99)
Potassium: 4.4 mmol/L (ref 3.5–5.1)
Sodium: 142 mmol/L (ref 135–145)
Total Bilirubin: 0.4 mg/dL (ref 0.3–1.2)
Total Protein: 6.4 g/dL — ABNORMAL LOW (ref 6.5–8.1)

## 2019-03-27 LAB — CBC WITH DIFFERENTIAL/PLATELET
Abs Immature Granulocytes: 0.05 10*3/uL (ref 0.00–0.07)
Basophils Absolute: 0 10*3/uL (ref 0.0–0.1)
Basophils Relative: 0 %
Eosinophils Absolute: 0 10*3/uL (ref 0.0–0.5)
Eosinophils Relative: 0 %
HCT: 37.3 % — ABNORMAL LOW (ref 39.0–52.0)
Hemoglobin: 11.9 g/dL — ABNORMAL LOW (ref 13.0–17.0)
Immature Granulocytes: 0 %
Lymphocytes Relative: 7 %
Lymphs Abs: 0.8 10*3/uL (ref 0.7–4.0)
MCH: 29.9 pg (ref 26.0–34.0)
MCHC: 31.9 g/dL (ref 30.0–36.0)
MCV: 93.7 fL (ref 80.0–100.0)
Monocytes Absolute: 0.4 10*3/uL (ref 0.1–1.0)
Monocytes Relative: 3 %
Neutro Abs: 10.8 10*3/uL — ABNORMAL HIGH (ref 1.7–7.7)
Neutrophils Relative %: 90 %
Platelets: 195 10*3/uL (ref 150–400)
RBC: 3.98 MIL/uL — ABNORMAL LOW (ref 4.22–5.81)
RDW: 13.2 % (ref 11.5–15.5)
WBC: 12 10*3/uL — ABNORMAL HIGH (ref 4.0–10.5)
nRBC: 0 % (ref 0.0–0.2)

## 2019-03-27 LAB — C-REACTIVE PROTEIN: CRP: 1.1 mg/dL — ABNORMAL HIGH (ref <1.0)

## 2019-03-27 LAB — MAGNESIUM: Magnesium: 2.1 mg/dL (ref 1.7–2.4)

## 2019-03-27 LAB — FERRITIN: Ferritin: 255 ng/mL (ref 24–336)

## 2019-03-27 LAB — FIBRIN DERIVATIVES D-DIMER (ARMC ONLY): Fibrin derivatives D-dimer (ARMC): 539.56 ng/mL (FEU) — ABNORMAL HIGH (ref 0.00–499.00)

## 2019-03-27 MED ORDER — SODIUM CHLORIDE 0.9 % IV SOLN
INTRAVENOUS | Status: DC | PRN
  Administered 2019-03-27: 25 mL via INTRAVENOUS

## 2019-03-27 MED ORDER — CEFDINIR 300 MG PO CAPS: 300.0000 mg | ORAL_CAPSULE | Freq: Two times a day (BID) | ORAL | 0 refills | Status: AC

## 2019-03-27 MED ORDER — GUAIFENESIN-DM 100-10 MG/5ML PO SYRP: 10.0000 mL | ORAL_SOLUTION | ORAL | 0 refills | Status: DC | PRN

## 2019-03-27 NOTE — Discharge Summary (Signed)
[facility] Discharge Summary   Patient: [name] Browning [id number]  PCP: Derinda Late, MD  Date of admission: 03/25/2019   Date of discharge:  03/27/2019     Discharge Diagnoses:  Principal diagnosis Community-acquired pneumonia  Active Problems:   Pneumonia   Admitted From: Home Disposition:  Home patient refused home health.  Rolling walker prescribed  Recommendations for Outpatient Follow-up:  1. PCP: In 1 week 2. Follow up LABS/TEST: CT scan after 3 months   Diet recommendation: Cardiac diet  Activity: The patient is advised to gradually reintroduce usual activities, as tolerated  Discharge Condition: stable  Code Status: Full code   History of present illness: As per the H and P dictated on admission 79 year old male with history of COPD presents with cough and shortness of breath. Has a history of previous COVID-19 infection. COVID-19 during this admission negative. Suspected working diagnosis community-acquired pneumonia with associated underlying COPD. We will continue current plan of treatment with antibiotics, steroids, nebulizers.   Hospital Course:  Summary of his active problems in the hospital is as following.  # Community-acquired pneumonia, acute hypoxic respiratory failure Patient responded clinically over interval, All cultures no growth to date. COVID-19 test negative S/p Continue IV Rocephin, day 3 and IV Zithromax, day 3, saturating well on room air, transition to oral antibiotics Omnicef 300 mg p.o. twice daily for 4 additional days to complete 7-day course of antibiotics.  Continue Robitussin-DM as needed for cough and inhalers at home.  COPD with mild exacerbation , Clinically responding over interval DC IV steroids, s/p Medrol Dosepak taper, does not need more steroids at discharge.  Continue nebulizer inhalers at home.  Follow with PCP.    Hypertension.Continue home lisinopril Dyslipidemia.Continue home statin GERD. Continue  PPI Atrial fibrillation, No rate control agents, Continue home Eliquis  Body mass index is 23.69 kg/m.  Nutrition Interventions: No Pain control meds were prescribed - [facility] Controlled Substance Reporting System database was not reviewed. - zero day supply was provided. - Patient was instructed, not to drive, operate heavy machinery, perform activities at heights, swimming or participation in water activities or provide baby sitting services while on Pain, Sleep and Anxiety Medications; until his outpatient Physician has advised to do so again.  - Also recommended to not to take more than prescribed Pain, Sleep and Anxiety Medications.  Patient was ambulatory, seen by PT, recommended rolling walker  Patient was seen by physical therapy, who recommended Home health, and rolling walker which was arranged, but patient refused home health. On the day of the discharge the patient's vitals were stable, and no other acute medical condition were reported by patient. the patient was felt safe to be discharge at Home with Therapy but family refused.  Consultants: None Procedures: none  Discharge Exam: General: Appear in no distress, no Rash; Oral Mucosa Clear, moist. Cardiovascular: S1 and S2 Present, no Murmur, Respiratory: normal respiratory effort, Bilateral Air entry present and no Crackles, no wheezes Abdomen: Bowel Sound present, Soft and no tenderness, no hernia Extremities: nno Pedal edema, no calf tenderness Neurology: alert and oriented to time, place, and person affect appropriate.  Filed Weights   03/25/19 0118  Weight: 82.6 kg   Vitals:   03/27/19 0742 03/27/19 0803  BP:  (!) 164/64  Pulse:  69  Resp:  18  Temp:  97.7 F (36.5 C)  SpO2: 95% 94%    DISCHARGE MEDICATION: Allergies as of 03/27/2019      Reactions   Hydromorphone  Other reaction(s): Hallucination Pt reports he doesn't know about this      Medication List    TAKE these medications    apixaban 5 MG Tabs tablet Commonly known as: ELIQUIS Take 1 tablet (5 mg total) by mouth 2 (two) times daily.   aspirin EC 81 MG tablet Take 81 mg by mouth daily as needed.   cefdinir 300 MG capsule Commonly known as: OMNICEF Take 1 capsule (300 mg total) by mouth 2 (two) times daily for 4 days.   cetirizine 10 MG tablet Commonly known as: ZYRTEC Take 10 mg by mouth daily.   Cholecalciferol 125 MCG (5000 UT) capsule Take 1 capsule by mouth daily.   guaiFENesin-dextromethorphan 100-10 MG/5ML syrup Commonly known as: ROBITUSSIN DM Take 10 mLs by mouth every 4 (four) hours as needed for cough.   Iron 240 (27 Fe) MG Tabs Take 1 tablet by mouth daily.   lisinopril 20 MG tablet Commonly known as: ZESTRIL TAKE 1 TABLET BY MOUTH EVERY DAY   pantoprazole 40 MG tablet Commonly known as: PROTONIX Take 40 mg by mouth daily.   rosuvastatin 40 MG tablet Commonly known as: Crestor Take 1 tablet (40 mg total) by mouth daily.   Symbicort 160-4.5 MCG/ACT inhaler Generic drug: budesonide-formoterol Inhale 2 puffs into the lungs daily.            Durable Medical Equipment  (From admission, onward)         Start     Ordered   03/27/19 1123  For home use only DME Walker rolling  Once    Question Answer Comment  Walker: With 5 Inch Wheels   Patient needs a walker to treat with the following condition Weakness      03/27/19 1122         Allergies  Allergen Reactions  . Hydromorphone     Other reaction(s): Hallucination Pt reports he doesn't know about this   Discharge Instructions    Diet - low sodium heart healthy   Complete by: As directed    Discharge instructions   Complete by: As directed    Follow with PCP in 1 week, repeat CT chest after 3 months for resolution of pneumonia and to rule out any malignancy. Repeat CBC and BMP after 1 week   Increase activity slowly   Complete by: As directed       The results of significant diagnostics from this  hospitalization (including imaging, microbiology, ancillary and laboratory) are listed below for reference.    Significant Diagnostic Studies: CT Angio Chest PE W/Cm &/Or Wo Cm  Result Date: 03/25/2019 CLINICAL DATA:  History of DVT on Eliquis, COVID-19 infection 2 months prior EXAM: CT ANGIOGRAPHY CHEST WITH CONTRAST TECHNIQUE: Multidetector CT imaging of the chest was performed using the standard protocol during bolus administration of intravenous contrast. Multiplanar CT image reconstructions and MIPs were obtained to evaluate the vascular anatomy. CONTRAST:  27mL OMNIPAQUE IOHEXOL 350 MG/ML SOLN COMPARISON:  Chest radiograph 03/25/2019 FINDINGS: Cardiovascular: Satisfactory opacification the pulmonary arteries to the segmental level. No pulmonary artery filling defects are identified. Central pulmonary arteries are normal caliber. Mild cardiomegaly with predominantly right heart enlargement. Small volume of fluid is noted within the pericardial recesses. Coronary arteries are calcified. Atherosclerotic plaque within the normal caliber aorta. Normal 3 vessel branching of the aortic arch. Major venous structures are unremarkable. Mediastinum/Nodes: There are multiple enlarged mediastinal and hilar lymph nodes for instance a 1.4 cm subcarinal node (4/52), a 1.0 cm create carinal lymph  node (4/37), and a 1.1 cm right hilar lymph node (4/57). No acute abnormality of the trachea or esophagus. Thyroid gland and thoracic inlet are unremarkable. Lungs/Pleura: Moderate centrilobular and paraseptal emphysematous changes are present throughout the lungs. There are small bilateral pleural effusions with some loculated fluid tracking within the fissures. There are some peripheral areas of mixed interstitial and ground-glass opacity with minimal consolidation. Extensive biapical pleuroparenchymal scarring is noted including a more reticular appearance in the left apex centered upon a 1 cm nodule in the left upper lobe  (6/23). Associated traction bronchiectasis is noted. Upper Abdomen: No acute abnormalities present in the visualized portions of the upper abdomen. Musculoskeletal: Multilevel degenerative changes are present in the imaged portions of the spine. No acute osseous abnormality or suspicious osseous lesion. The osseous structures appear diffusely demineralized which may limit detection of small or nondisplaced fractures. Review of the MIP images confirms the above findings. IMPRESSION: 1. No evidence of acute pulmonary embolism. 2. There are features of peripheral ground-glass and interstitial opacities with admixed consolidation which could reflect a combination of residual atypical infection and edema as well as small bilateral pleural effusions with loculated components tracking in the fissures. These loculated collections appear to correspond to more nodular opacity seen on comparison radiograph. 3. Mediastinal and hilar lymphadenopathy, favor reactive. 4. Background of centrilobular and paraseptal emphysema ( Emphysema (ICD10-J43.9).) 5. Extensive biapical pleuroparenchymal scarring with a more reticular appearance in the left apex centered upon a 1 cm nodule. This finding is nonspecific and could be postinflammatory in etiology. Recommend follow-up chest CT in 3 months. 6. Coronary artery calcifications. 7. Aortic Atherosclerosis (ICD10-I70.0) Electronically Signed   By: Lovena Le M.D.   On: 03/25/2019 03:22   DG Chest Port 1 View  Result Date: 03/25/2019 CLINICAL DATA:  Hypoxemia, positive for COVID-19 2 months prior EXAM: PORTABLE CHEST 1 VIEW COMPARISON:  Radiograph 02/08/2019 FINDINGS: Diffusely coarsened interstitial opacities with interspersed hazy opacity including more nodular opacity in the periphery of the right mid to upper lung. No pneumothorax. No effusion. The cardiomediastinal contours are unremarkable. No acute osseous or soft tissue abnormality. Degenerative changes are present in the  imaged spine and shoulders. Telemetry leads overlie the chest. IMPRESSION: Findings worrisome for recurrent or residual infection on a background of chronic interstitial disease. Alternatively, edema could have a similar appearance. Nodular opacities favored to be infectious or inflammatory though recommend 3 month follow-up imaging to assess for resolution. Electronically Signed   By: Lovena Le M.D.   On: 03/25/2019 01:49    Microbiology: Recent Results (from the past 240 hour(s))  Blood Culture (routine x 2)     Status: None (Preliminary result)   Collection Time: 03/25/19  1:35 AM   Specimen: BLOOD  Result Value Ref Range Status   Specimen Description BLOOD LEFT ANTECUBITAL  Final   Special Requests   Final    BOTTLES DRAWN AEROBIC AND ANAEROBIC Blood Culture results may not be optimal due to an excessive volume of blood received in culture bottles   Culture   Final    NO GROWTH 2 DAYS Performed at [facility], [address] [place], [place] [address]    Report Status PENDING  Incomplete  Blood Culture (routine x 2)     Status: None (Preliminary result)   Collection Time: 03/25/19  1:35 AM   Specimen: BLOOD  Result Value Ref Range Status   Specimen Description BLOOD RIGHT ANTECUBITAL  Final   Special Requests   Final  BOTTLES DRAWN AEROBIC AND ANAEROBIC Blood Culture results may not be optimal due to an excessive volume of blood received in culture bottles   Culture   Final    NO GROWTH 2 DAYS Performed at [facility], [address] [place], [place] [address]    Report Status PENDING  Incomplete  Respiratory Panel by RT PCR (Flu A&B, Covid) - Nasopharyngeal Swab     Status: None   Collection Time: 03/25/19  3:21 AM   Specimen: Nasopharyngeal Swab  Result Value Ref Range Status   SARS Coronavirus 2 by RT PCR NEGATIVE NEGATIVE Final    Comment: (NOTE) SARS-CoV-2 target nucleic acids are NOT DETECTED. The SARS-CoV-2 RNA is generally detectable in  upper respiratoy specimens during the acute phase of infection. The lowest concentration of SARS-CoV-2 viral copies this assay can detect is 131 copies/mL. A negative result does not preclude SARS-Cov-2 infection and should not be used as the sole basis for treatment or other patient management decisions. A negative result may occur with  improper specimen collection/handling, submission of specimen other than nasopharyngeal swab, presence of viral mutation(s) within the areas targeted by this assay, and inadequate number of viral copies (<131 copies/mL). A negative result must be combined with clinical observations, patient history, and epidemiological information. The expected result is Negative. Fact Sheet for Patients:  PinkCheek.be Fact Sheet for Healthcare Providers:  GravelBags.it This test is not yet ap proved or cleared by the [place] FDA and  has been authorized for detection and/or diagnosis of SARS-CoV-2 by FDA under an Emergency Use Authorization (EUA). This EUA will remain  in effect (meaning this test can be used) for the duration of the COVID-19 declaration under Section 564(b)(1) of the Act, 21 U.S.C. section 360bbb-3(b)(1), unless the authorization is terminated or revoked sooner.    Influenza A by PCR NEGATIVE NEGATIVE Final   Influenza B by PCR NEGATIVE NEGATIVE Final    Comment: (NOTE) The Xpert Xpress SARS-CoV-2/FLU/RSV assay is intended as an aid in  the diagnosis of influenza from Nasopharyngeal swab specimens and  should not be used as a sole basis for treatment. Nasal washings and  aspirates are unacceptable for Xpert Xpress SARS-CoV-2/FLU/RSV  testing. Fact Sheet for Patients: PinkCheek.be Fact Sheet for Healthcare Providers: GravelBags.it This test is not yet approved or cleared by the [place] FDA and  has been authorized for  detection and/or diagnosis of SARS-CoV-2 by  FDA under an Emergency Use Authorization (EUA). This EUA will remain  in effect (meaning this test can be used) for the duration of the  Covid-19 declaration under Section 564(b)(1) of the Act, 21  U.S.C. section 360bbb-3(b)(1), unless the authorization is  terminated or revoked. Performed at [facility], [address] [place], [place] [address]      Labs: CBC: Recent Labs  Lab 03/25/19 0133 03/26/19 0602 03/27/19 0353  WBC 8.3 9.9 12.0*  NEUTROABS 6.0 8.9* 10.8*  HGB 13.1 11.8* 11.9*  HCT 40.1 35.9* 37.3*  MCV 93.0 93.2 93.7  PLT 183 187 0000000   Basic Metabolic Panel: Recent Labs  Lab 03/25/19 0133 03/26/19 0602 03/27/19 0353  NA 139 141 142  K 3.7 4.7 4.4  CL 106 109 109  CO2 26 25 27   GLUCOSE 109* 152* 158*  BUN 17 25* 25*  CREATININE 0.99 0.87 0.80  CALCIUM 8.7* 8.9 8.8*  MG  --  2.3 2.1   Liver Function Tests: Recent Labs  Lab 03/25/19 0133 03/26/19 0602 03/27/19 0353  AST 31 26 23   ALT 31 26 25   ALKPHOS 79 76 69  BILITOT 0.9 0.5 0.4  PROT 7.1 6.4* 6.4*  ALBUMIN 3.6 3.0* 3.1*   No results for input(s): LIPASE, AMYLASE in the last 168 hours. No results for input(s): AMMONIA in the last 168 hours. Cardiac Enzymes: No results for input(s): CKTOTAL, CKMB, CKMBINDEX, TROPONINI in the last 168 hours. BNP (last 3 results) Recent Labs    02/09/19 0640 03/25/19 0133  BNP 296.0* 286.0*   CBG: No results for input(s): GLUCAP in the last 168 hours.  Time spent: 35 minutes  Signed:  Val Riles  [facility]  03/27/2019 11:38 AM

## 2019-03-27 NOTE — TOC Transition Note (Signed)
Transition of Care [facility]) - CM/SW Discharge Note   Patient Details  Name: [name] Browning MRN: [id number] Date of Birth: 04-26-1940  [facility]) CM/SW Contact:  Shelbie Hutching, RN Phone Number: 03/27/2019, 12:04 PM   Clinical Narrative:    Patient has been medically cleared for discharge home today.  Rolling Gilford Rile has been delivered to the patient room by [employer].  Family will come and pick patient up.    Final next level of care: Home/Self Care Barriers to Discharge: Barriers Resolved   Patient Goals and CMS Choice Patient states their goals for this hospitalization and ongoing recovery are:: Wants to get back home      Discharge Placement                       Discharge Plan and Services   Discharge Planning Services: CM Consult            DME Arranged: Gilford Rile rolling DME Agency: [employer] Date DME Agency Contacted: 03/27/19 Time DME Agency Contacted: [id number] Representative spoke with at DME Agency: [employer] Arranged: Refused [place]          Social Determinants of Health ([place]) Interventions     Readmission Risk Interventions No flowsheet data found.

## 2019-03-27 NOTE — TOC Initial Note (Signed)
Transition of Care [facility]) - Initial/Assessment Note    Patient Details  Name: [name] Browning MRN: [id number] Date of Birth: 09/01/1940  Transition of Care [facility]) CM/SW Contact:    Shelbie Hutching, RN Phone Number: 03/27/2019, 9:43 AM  Clinical Narrative:                 Patient admitted for pneumonia, he has a history of COPD and was COVID + back in January of this year.  Patient reports that he is from home where he lives with his ex wife, reports they got divorced and then back together the next week.  Patient is independent in ADL's and drives.  PT recommends home health PT but patient declines home health services at this time.  Patient reports that his daughter and granddaughter are nurses and he will have plenty of help at home.  Patient does need a rolling walker, which will be provided by [employer] and brought up to the patient room before discharge.  Patient reports that his daughter or granddaughter will pick him up at discharge.   Expected Discharge Plan: Home/Self Care Barriers to Discharge: Continued Medical Work up   Patient Goals and CMS Choice Patient states their goals for this hospitalization and ongoing recovery are:: Wants to get back home      Expected Discharge Plan and Services Expected Discharge Plan: Home/Self Care   Discharge Planning Services: CM Consult   Living arrangements for the past 2 months: Mobile Home                 DME Arranged: Walker rolling DME Agency: [employer] Date DME Agency Contacted: 03/27/19 Time DME Agency Contacted: [phone] Representative spoke with at DME Agency: [place] Arranged: Refused HH          Prior Living Arrangements/Services Living arrangements for the past 2 months: Mobile Home Lives with:: Significant Other(ex wife) Patient language and need for interpreter reviewed:: Yes Do you feel safe going back to the place where you live?: Yes      Need for Family Participation in Patient Care: Yes  (Comment)(pneumonia) Care giver support system in place?: Yes (comment)(daughter and granddaughter)   Criminal Activity/Legal Involvement Pertinent to Current Situation/Hospitalization: No - Comment as needed  Activities of Daily Living Home Assistive Devices/Equipment: None ADL Screening (condition at time of admission) Patient's cognitive ability adequate to safely complete daily activities?: Yes Is the patient deaf or have difficulty hearing?: No Does the patient have difficulty seeing, even when wearing glasses/contacts?: No Does the patient have difficulty concentrating, remembering, or making decisions?: No Patient able to express need for assistance with ADLs?: Yes Does the patient have difficulty dressing or bathing?: No Independently performs ADLs?: Yes (appropriate for developmental age) Does the patient have difficulty walking or climbing stairs?: No Weakness of Legs: None Weakness of Arms/Hands: None  Permission Sought/Granted Permission sought to share information with : Case Manager, Family Supports Permission granted to share information with : Yes, Verbal Permission Granted        Permission granted to share info w Relationship: daughter and granddaughter     Emotional Assessment Appearance:: Appears stated age Attitude/Demeanor/Rapport: Engaged Affect (typically observed): Accepting Orientation: : Oriented to Self, Oriented to Place, Oriented to  Time, Oriented to Situation Alcohol / Substance Use: Not Applicable Psych Involvement: No (comment)  Admission diagnosis:  Pneumonia [J18.9] Acute respiratory failure with hypoxemia ([place]) [J96.01] COVID-19 [U07.1] Patient Active Problem List   Diagnosis Date Noted  . Pneumonia 03/25/2019  .  COVID-19 virus infection 02/08/2019  . New onset atrial fibrillation ([place]) 02/08/2019  . Chronic neck pain 03/09/2017  . Degenerative disc disease, cervical 03/09/2017  . Perennial allergic rhinitis 03/09/2017  .  Hyperglycemia 09/01/2015  . Elevated rheumatoid factor 04/08/2015  . Arthritis of both hands 04/01/2015  . Bilateral hand pain 03/18/2015  . Elevated alkaline phosphatase level 03/02/2015  . Anxiety 02/16/2015  . Intermittent chest pain 02/16/2015  . History of TIA (transient ischemic attack) 05/18/2014  . HTN (hypertension) 05/18/2014  . Hyperlipidemia 05/18/2014  . Status post carpal tunnel release 06/26/2013   PCP:  Derinda Late, MD Pharmacy:   [id number] [place], [place] - [address] [place] [address] Phone: [phone] Fax: [phone]     Social Determinants of Health (SDOH) Interventions    Readmission Risk Interventions No flowsheet data found.

## 2019-03-30 LAB — CULTURE, BLOOD (ROUTINE X 2)
Culture: NO GROWTH
Culture: NO GROWTH

## 2019-04-01 DIAGNOSIS — J9601 Acute respiratory failure with hypoxia: Secondary | ICD-10-CM | POA: Diagnosis not present

## 2019-04-01 DIAGNOSIS — J449 Chronic obstructive pulmonary disease, unspecified: Secondary | ICD-10-CM | POA: Diagnosis not present

## 2019-04-01 DIAGNOSIS — J189 Pneumonia, unspecified organism: Secondary | ICD-10-CM | POA: Diagnosis not present

## 2019-04-02 ENCOUNTER — Other Ambulatory Visit: Payer: Self-pay

## 2019-04-02 NOTE — Patient Outreach (Signed)
[facility]) Care Management  04/02/2019  MARQUES LUTHMAN [birth year] [id number]    EMMI-General Discharge RED ON EMMI ALERT Day # 4 Date: 04/01/2019 Red Alert Reason: "Got discharge papers? No"   Outreach attempt #1 to patient. Spoke with patient. He denies any acute issues or concerns at present. He reports he is improving since getting home. Reviewed and addressed red alert with patient. He confirms that he has paperwork and no questions or concerns regarding it. He completed PCP follow up appt on yesterday. Patient confirms he has all his meds in the home and no issues or concerns regarding them.Patient has completed automated post discharge EMMI-General calls. Patient verbalizes no further RN CM needs or concerns at this time.      Plan: RN CM will close case at this time.  Enzo Montgomery, RN,BSN,CCM [place] [facility] Telephonic Care Management Coordinator Direct Phone: [phone] Toll Free: [phone] Fax: [phone]

## 2019-06-08 ENCOUNTER — Ambulatory Visit: Payer: Medicare HMO | Attending: Internal Medicine

## 2019-06-08 ENCOUNTER — Other Ambulatory Visit: Payer: Self-pay

## 2019-06-08 DIAGNOSIS — Z23 Encounter for immunization: Secondary | ICD-10-CM

## 2019-06-08 NOTE — Progress Notes (Signed)
   Covid-19 Vaccination Clinic  Name:  [name] Browning    MRN: [id number] DOB: July 17, 1940  06/08/2019  [name] Browning was observed post Covid-19 immunization for 15 minutes without incident. He was provided with Vaccine Information Sheet and instruction to access the V-Safe system.   [name] Browning was instructed to call 911 with any severe reactions post vaccine: Marland Kitchen Difficulty breathing  . Swelling of face and throat  . A fast heartbeat  . A bad rash all over body  . Dizziness and weakness   Immunizations Administered    Name Date Dose VIS Date Route   Pfizer COVID-19 Vaccine 06/08/2019  9:53 AM 0.3 mL 03/06/2018 Intramuscular   Manufacturer: [place]   Lot: [id number]   [place]: [id number]

## 2019-06-19 ENCOUNTER — Other Ambulatory Visit: Payer: Self-pay | Admitting: Dermatology

## 2019-06-29 ENCOUNTER — Ambulatory Visit: Payer: Medicare HMO | Attending: Internal Medicine

## 2019-06-29 DIAGNOSIS — Z23 Encounter for immunization: Secondary | ICD-10-CM

## 2019-06-29 NOTE — Progress Notes (Signed)
   Covid-19 Vaccination Clinic  Name:  [name] Browning    MRN: [id number] DOB: 02-08-1940  06/29/2019  [name] Browning was observed post Covid-19 immunization for 15 minutes without incident. He was provided with Vaccine Information Sheet and instruction to access the V-Safe system.   [name] Browning was instructed to call 911 with any severe reactions post vaccine: Marland Kitchen Difficulty breathing  . Swelling of face and throat  . A fast heartbeat  . A bad rash all over body  . Dizziness and weakness   Immunizations Administered    Name Date Dose VIS Date Route   Pfizer COVID-19 Vaccine 06/29/2019  9:44 AM 0.3 mL 03/06/2018 Intramuscular   Manufacturer: [place]   Lot: [id number]   [place]: [id number]

## 2019-12-03 DIAGNOSIS — Z1331 Encounter for screening for depression: Secondary | ICD-10-CM | POA: Diagnosis not present

## 2019-12-03 DIAGNOSIS — N400 Enlarged prostate without lower urinary tract symptoms: Secondary | ICD-10-CM | POA: Diagnosis not present

## 2019-12-03 DIAGNOSIS — I10 Essential (primary) hypertension: Secondary | ICD-10-CM | POA: Diagnosis not present

## 2019-12-03 DIAGNOSIS — M810 Age-related osteoporosis without current pathological fracture: Secondary | ICD-10-CM | POA: Diagnosis not present

## 2019-12-03 DIAGNOSIS — Z6825 Body mass index (BMI) 25.0-25.9, adult: Secondary | ICD-10-CM | POA: Diagnosis not present

## 2019-12-03 DIAGNOSIS — E782 Mixed hyperlipidemia: Secondary | ICD-10-CM | POA: Diagnosis not present

## 2019-12-03 DIAGNOSIS — K429 Umbilical hernia without obstruction or gangrene: Secondary | ICD-10-CM | POA: Diagnosis not present

## 2019-12-03 DIAGNOSIS — K219 Gastro-esophageal reflux disease without esophagitis: Secondary | ICD-10-CM | POA: Diagnosis not present

## 2019-12-03 DIAGNOSIS — J449 Chronic obstructive pulmonary disease, unspecified: Secondary | ICD-10-CM | POA: Diagnosis not present

## 2019-12-03 DIAGNOSIS — M129 Arthropathy, unspecified: Secondary | ICD-10-CM | POA: Diagnosis not present

## 2020-01-01 DIAGNOSIS — I10 Essential (primary) hypertension: Secondary | ICD-10-CM | POA: Diagnosis not present

## 2020-01-22 DIAGNOSIS — J449 Chronic obstructive pulmonary disease, unspecified: Secondary | ICD-10-CM | POA: Diagnosis not present

## 2020-01-22 DIAGNOSIS — R0602 Shortness of breath: Secondary | ICD-10-CM | POA: Diagnosis not present

## 2020-01-22 DIAGNOSIS — Z6823 Body mass index (BMI) 23.0-23.9, adult: Secondary | ICD-10-CM | POA: Diagnosis not present

## 2020-01-22 DIAGNOSIS — R509 Fever, unspecified: Secondary | ICD-10-CM | POA: Diagnosis not present

## 2020-01-22 DIAGNOSIS — Z8616 Personal history of COVID-19: Secondary | ICD-10-CM | POA: Diagnosis not present

## 2020-01-22 DIAGNOSIS — I10 Essential (primary) hypertension: Secondary | ICD-10-CM | POA: Diagnosis not present

## 2020-01-22 DIAGNOSIS — Z1152 Encounter for screening for COVID-19: Secondary | ICD-10-CM | POA: Diagnosis not present

## 2020-01-23 DIAGNOSIS — Z8616 Personal history of COVID-19: Secondary | ICD-10-CM | POA: Diagnosis not present

## 2020-01-23 DIAGNOSIS — R0602 Shortness of breath: Secondary | ICD-10-CM | POA: Diagnosis not present

## 2020-01-23 DIAGNOSIS — J449 Chronic obstructive pulmonary disease, unspecified: Secondary | ICD-10-CM | POA: Diagnosis not present

## 2020-02-03 DIAGNOSIS — U071 COVID-19: Secondary | ICD-10-CM | POA: Diagnosis not present

## 2020-02-03 DIAGNOSIS — J449 Chronic obstructive pulmonary disease, unspecified: Secondary | ICD-10-CM | POA: Diagnosis not present

## 2020-02-03 DIAGNOSIS — I10 Essential (primary) hypertension: Secondary | ICD-10-CM | POA: Diagnosis not present

## 2020-02-03 DIAGNOSIS — Z8616 Personal history of COVID-19: Secondary | ICD-10-CM | POA: Diagnosis not present

## 2020-04-13 DIAGNOSIS — J0100 Acute maxillary sinusitis, unspecified: Secondary | ICD-10-CM | POA: Diagnosis not present

## 2020-04-13 DIAGNOSIS — K429 Umbilical hernia without obstruction or gangrene: Secondary | ICD-10-CM | POA: Diagnosis not present

## 2020-04-13 DIAGNOSIS — M6208 Separation of muscle (nontraumatic), other site: Secondary | ICD-10-CM | POA: Diagnosis not present

## 2020-04-13 DIAGNOSIS — E785 Hyperlipidemia, unspecified: Secondary | ICD-10-CM | POA: Diagnosis not present

## 2020-04-22 ENCOUNTER — Other Ambulatory Visit: Payer: Self-pay

## 2020-04-22 ENCOUNTER — Telehealth: Payer: Self-pay

## 2020-04-22 ENCOUNTER — Ambulatory Visit (INDEPENDENT_AMBULATORY_CARE_PROVIDER_SITE_OTHER): Payer: Medicare HMO | Admitting: Surgery

## 2020-04-22 ENCOUNTER — Encounter: Payer: Self-pay | Admitting: Surgery

## 2020-04-22 VITALS — BP 155/63 | HR 80 | Temp 97.9°F | Ht 74.0 in | Wt 182.2 lb

## 2020-04-22 DIAGNOSIS — K429 Umbilical hernia without obstruction or gangrene: Secondary | ICD-10-CM

## 2020-04-22 DIAGNOSIS — M6208 Separation of muscle (nontraumatic), other site: Secondary | ICD-10-CM

## 2020-04-22 NOTE — Patient Instructions (Addendum)
Our surgery scheduler Pamala Hurry will call you within 24-48 hours to get you scheduled. If you have not heard from her after 48 hours, please call our office. You will need to get Covid tested before surgery and have the blue sheet available when she calls to write down important information. If you have any concerns or questions, please feel free to call our office.     Umbilical Hernia, Adult  A hernia is a bulge of tissue that pushes through an opening between muscles. An umbilical hernia happens in the abdomen, near the belly button (umbilicus). The hernia may contain tissues from the small intestine, large intestine, or fatty tissue covering the intestines (omentum). Umbilical hernias in adults tend to get worse over time, and they require surgical treatment. There are several types of umbilical hernias. You may have:  A hernia located just above or below the umbilicus (indirect hernia). This is the most common type of umbilical hernia in adults.  A hernia that forms through an opening formed by the umbilicus (direct hernia).  A hernia that comes and goes (reducible hernia). A reducible hernia may be visible only when you strain, lift something heavy, or cough. This type of hernia can be pushed back into the abdomen (reduced).  A hernia that traps abdominal tissue inside the hernia (incarcerated hernia). This type of hernia cannot be reduced.  A hernia that cuts off blood flow to the tissues inside the hernia (strangulated hernia). The tissues can start to die if this happens. This type of hernia requires emergency treatment. What are the causes? An umbilical hernia happens when tissue inside the abdomen presses on a weak area of the abdominal muscles. What increases the risk? You may have a greater risk of this condition if you:  Are obese.  Have had several pregnancies.  Have a buildup of fluid inside your abdomen (ascites).  Have had surgery that weakens the abdominal  muscles. What are the signs or symptoms? The main symptom of this condition is a painless bulge at or near the belly button. A reducible hernia may be visible only when you strain, lift something heavy, or cough. Other symptoms may include:  Dull pain.  A feeling of pressure. Symptoms of a strangulated hernia may include:  Pain that gets increasingly worse.  Nausea and vomiting.  Pain when pressing on the hernia.  Skin over the hernia becoming red or purple.  Constipation.  Blood in the stool. How is this diagnosed? This condition may be diagnosed based on:  A physical exam. You may be asked to cough or strain while standing. These actions increase the pressure inside your abdomen and force the hernia through the opening in your muscles. Your health care provider may try to reduce the hernia by pressing on it.  Your symptoms and medical history. How is this treated? Surgery is the only treatment for an umbilical hernia. Surgery for a strangulated hernia is done as soon as possible. If you have a small hernia that is not incarcerated, you may need to lose weight before having surgery. Follow these instructions at home:  Lose weight, if told by your health care provider.  Do not try to push the hernia back in.  Watch your hernia for any changes in color or size. Tell your health care provider if any changes occur.  You may need to avoid activities that increase pressure on your hernia.  Do not lift anything that is heavier than 10 lb (4.5 kg) until your health  care provider says that this is safe.  Take over-the-counter and prescription medicines only as told by your health care provider.  Keep all follow-up visits as told by your health care provider. This is important. Contact a health care provider if:  Your hernia gets larger.  Your hernia becomes painful. Get help right away if:  You develop sudden, severe pain near the area of your hernia.  You have pain as  well as nausea or vomiting.  You have pain and the skin over your hernia changes color.  You develop a fever. This information is not intended to replace advice given to you by your health care provider. Make sure you discuss any questions you have with your health care provider. Document Revised: 02/08/2017 Document Reviewed: 06/27/2016 Elsevier Patient Education  [place].

## 2020-04-22 NOTE — Progress Notes (Signed)
04/22/2020  Reason for Visit:  Umbilical hernia and diastasis recti  Referring Provider:  Celene Squibb, MD  History of Present Illness: [name] Browning is a 80 y.o. male presenting for evaluation of an umbilical hernia and diastasis recti.  The patient reports that he noticed the bulging about 2 years ago.  Has discomfort in the mid abdomen, which is localized and does not radiate.  Reports more discomfort if he's doing something more strenuous.  Denies any worsening with po intake, fevers, chills, chest pain, shortness of breath.  He was seen by his PCP and was diagnosed with an umbilical hernia and diastasis recti.    The patient has a significant medical history with hx of CVA, HTN, COPD, HLP, afib.  He does have history of right lower extremity thrombectomy.  His medication list is unclear.  On his e-chart, he takes ASA and Eliquis, but from the chart faxed by his PCP, he instead takes Plavix.  When asking the patient about his medications, he's not sure.  He mentions that he ran out of Eliquis and is not on Plavix and only on ASA.    Past Medical History: Past Medical History:  Diagnosis Date  . Anxiety   . Cancer ([place])    skin cancer on right ear and right forehead removed  . Carpal tunnel syndrome    Both wrists.  . Carpal tunnel syndrome   . COPD (chronic obstructive pulmonary disease) ([place])   . DVT (deep venous thrombosis) ([place])    taken off anticoagulation after about 6 months  . HLD (hyperlipidemia)   . Hypertension   . Kidney stones   . Leaky heart valve   . Normal coronary arteries    a. by cardiac cath in 2004; b. normal stress test in 2007 and 2011; c. Lexiscan 05/20/2014: no significant ischemia, EF 55-65%, no EKG changes, low risk study   . Pneumonia   . Stroke [facility])    TIA in 2016  . Syncope      Past Surgical History: Past Surgical History:  Procedure Laterality Date  . CARDIAC CATHETERIZATION  2004  . CARPAL TUNNEL RELEASE Right 06/11/2013  . SHOULDER  ARTHROSCOPY WITH OPEN ROTATOR CUFF REPAIR Right 08/24/2017   Procedure: SHOULDER ARTHROSCOPY WITH OPEN ROTATOR CUFF REPAIR;  Surgeon: Corky Mull, MD;  Location: [facility] ORS;  Service: Orthopedics;  Laterality: Right;  . SKIN CANCER EXCISION  12/01/2016   right ear   . SKIN CANCER EXCISION     remove from the right side of the face   . THROMBECTOMY Right 2004   leg  . THYROIDECTOMY  1950   Not sure if total or partial thyroidectomy.    Home Medications: Prior to Admission medications   Medication Sig Start Date End Date Taking? Authorizing Provider  apixaban (ELIQUIS) 5 MG TABS tablet Take 1 tablet (5 mg total) by mouth 2 (two) times daily. 02/11/19 03/25/19 Yes Enzo Bi, MD  aspirin EC 81 MG tablet Take 81 mg by mouth daily as needed.    Yes [provider]  cetirizine (ZYRTEC) 10 MG tablet Take 10 mg by mouth daily. 10/10/18  Yes [provider]  Cholecalciferol 125 MCG (5000 UT) capsule Take 1 capsule by mouth daily.   Yes [provider]  Ferrous Gluconate (IRON) 240 (27 Fe) MG TABS Take 1 tablet by mouth daily. 09/21/18  Yes [provider]  guaiFENesin-dextromethorphan (ROBITUSSIN DM) 100-10 MG/5ML syrup Take 10 mLs by mouth every 4 (four) hours as  needed for cough. 03/27/19  Yes Val Riles, MD  lisinopril (PRINIVIL,ZESTRIL) 20 MG tablet TAKE 1 TABLET BY MOUTH EVERY DAY 11/24/17  Yes Sowles, Drue Stager, MD  metroNIDAZOLE (METROGEL) 1 % gel APPLY TOPICALLY 1 TO 2 TIMES A DAY 06/19/19  Yes Brendolyn Patty, MD  pantoprazole (PROTONIX) 40 MG tablet Take 40 mg by mouth daily. 12/10/18  Yes [provider]  rosuvastatin (CRESTOR) 40 MG tablet Take 1 tablet (40 mg total) by mouth daily. 03/09/17  Yes Sowles, Drue Stager, MD  SYMBICORT 160-4.5 MCG/ACT inhaler Inhale 2 puffs into the lungs daily. 12/07/18  Yes [provider]    Allergies: Allergies  Allergen Reactions  . Hydromorphone     Other reaction(s): Hallucination Pt reports he doesn't know  about this    Social History:  reports that he quit smoking about 17 years ago. His smoking use included cigarettes. He started smoking about 63 years ago. He has a 46.00 pack-year smoking history. He quit smokeless tobacco use about 17 years ago.  His smokeless tobacco use included snuff. He reports current alcohol use of about 7.0 standard drinks of alcohol per week. He reports that he does not use drugs.   Family History: Family History  Problem Relation Age of Onset  . Hypertension Mother   . Heart disease Mother   . CAD Father   . Heart attack Father     Review of Systems: Review of Systems  Constitutional: Negative for chills and fever.  Eyes: Negative for blurred vision.  Respiratory: Negative for shortness of breath.   Cardiovascular: Negative for chest pain.  Gastrointestinal: Positive for abdominal pain. Negative for constipation, diarrhea, nausea and vomiting.  Genitourinary: Negative for dysuria.  Musculoskeletal: Negative for myalgias.  Skin: Negative for rash.  Neurological: Negative for dizziness.  Psychiatric/Behavioral: Negative for depression.    Physical Exam BP (!) 155/63   Pulse 80   Temp 97.9 F (36.6 C) (Oral)   Ht 6\' 2"  (1.88 m)   Wt 182 lb 3.2 oz (82.6 kg)   SpO2 92%   BMI 23.39 kg/m  CONSTITUTIONAL: No acute distress HEENT:  Normocephalic, atraumatic, extraocular motion intact. NECK: Trachea is midline, and there is no jugular venous distension.  RESPIRATORY:  Lungs are clear, and breath sounds are equal bilaterally. Normal respiratory effort without pathologic use of accessory muscles. CARDIOVASCULAR: Heart is regular without murmurs, gallops, or rubs. GI: The abdomen is soft, non-distended.  The patient has diastasis recti, with about 3 cm separation between rectus muscles.  The patient also has an umbilical hernia, with defect measuring about 1 cm.  There is tenderness when pushing deeply on the umbilical hernia site.   MUSCULOSKELETAL:   Normal muscle strength and tone in all four extremities.  No peripheral edema or cyanosis. SKIN: Skin turgor is normal. There are no pathologic skin lesions.  NEUROLOGIC:  Motor and sensation is grossly normal.  Cranial nerves are grossly intact. PSYCH:  Alert and oriented to person, place and time. Affect is normal.  Laboratory Analysis: No results found for this or any previous visit (from the past 24 hour(s)).  Imaging: No results found.  Assessment and Plan: This is a 80 y.o. male with umbilical hernia and diastasis recti.  --Discussed with the patient that the epigastric area of tenting is not a hernia but separation of the rectus muscles called diastasis recti.  However, he does have an umbilical hernia.  Discussed with him the thought process for repair.  With his medical comorbidities, I think  attempting repair of his umbilical hernia and plication of his diastasis recti will be too big of a surgery for him, compared to doing only his umbilical hernia.  There is a possible higher risk of recurrence of his hernia since the repair and the mesh placement would overlap in the diastasis which is weaker.  However, I think in his case, less is more.  He is in agreement. --Will schedule him for robotic assisted umbilical hernia repair on 05/07/20.  Discussed risks of bleeding, infection, and injury to surrounding structures, as well as post-op recovery and activity restrictions.  Will need both medical and cardiology clearance for him.  I'm unclear about which anticoagulants or antiplatelets he's on.  Discussed with him that he needs to send [place] the list of his medications that he's taking at home.  He or his daughter will call us tomorrow or send [place] a list of the medications that he's taking so we know what to do with medication interruption.  Discussed with him this would be 5 days for Aspirin, 7 days for Plavix, or 2 days for Eliquis. --Patient will call tomorrow with his  medications.  Face-to-face time spent with the patient and care providers was 80 minutes, with more than 50% of the time spent counseling, educating, and coordinating care of the patient.     Melvyn Neth, [facility]

## 2020-04-22 NOTE — Telephone Encounter (Signed)
Faxed medical clearance to Dr. Julaine Fusi @ [phone].  Faxed cardiac clearance to Dr. Bartholome Bill @ [phone].

## 2020-04-22 NOTE — H&P (View-Only) (Signed)
04/22/2020  Reason for Visit:  Umbilical hernia and diastasis recti  Referring Provider:  Celene Squibb, MD  History of Present Illness: [name] Browning is a 80 y.o. male presenting for evaluation of an umbilical hernia and diastasis recti.  The patient reports that he noticed the bulging about 2 years ago.  Has discomfort in the mid abdomen, which is localized and does not radiate.  Reports more discomfort if he's doing something more strenuous.  Denies any worsening with po intake, fevers, chills, chest pain, shortness of breath.  He was seen by his PCP and was diagnosed with an umbilical hernia and diastasis recti.    The patient has a significant medical history with hx of CVA, HTN, COPD, HLP, afib.  He does have history of right lower extremity thrombectomy.  His medication list is unclear.  On his e-chart, he takes ASA and Eliquis, but from the chart faxed by his PCP, he instead takes Plavix.  When asking the patient about his medications, he's not sure.  He mentions that he ran out of Eliquis and is not on Plavix and only on ASA.    Past Medical History: Past Medical History:  Diagnosis Date  . Anxiety   . Cancer ([place])    skin cancer on right ear and right forehead removed  . Carpal tunnel syndrome    Both wrists.  . Carpal tunnel syndrome   . COPD (chronic obstructive pulmonary disease) ([place])   . DVT (deep venous thrombosis) ([place])    taken off anticoagulation after about 6 months  . HLD (hyperlipidemia)   . Hypertension   . Kidney stones   . Leaky heart valve   . Normal coronary arteries    a. by cardiac cath in 2004; b. normal stress test in 2007 and 2011; c. Lexiscan 05/20/2014: no significant ischemia, EF 55-65%, no EKG changes, low risk study   . Pneumonia   . Stroke [facility])    TIA in 2016  . Syncope      Past Surgical History: Past Surgical History:  Procedure Laterality Date  . CARDIAC CATHETERIZATION  2004  . CARPAL TUNNEL RELEASE Right 06/11/2013  . SHOULDER  ARTHROSCOPY WITH OPEN ROTATOR CUFF REPAIR Right 08/24/2017   Procedure: SHOULDER ARTHROSCOPY WITH OPEN ROTATOR CUFF REPAIR;  Surgeon: Corky Mull, MD;  Location: [facility] ORS;  Service: Orthopedics;  Laterality: Right;  . SKIN CANCER EXCISION  12/01/2016   right ear   . SKIN CANCER EXCISION     remove from the right side of the face   . THROMBECTOMY Right 2004   leg  . THYROIDECTOMY  1950   Not sure if total or partial thyroidectomy.    Home Medications: Prior to Admission medications   Medication Sig Start Date End Date Taking? Authorizing Provider  apixaban (ELIQUIS) 5 MG TABS tablet Take 1 tablet (5 mg total) by mouth 2 (two) times daily. 02/11/19 03/25/19 Yes Enzo Bi, MD  aspirin EC 81 MG tablet Take 81 mg by mouth daily as needed.    Yes [provider]  cetirizine (ZYRTEC) 10 MG tablet Take 10 mg by mouth daily. 10/10/18  Yes [provider]  Cholecalciferol 125 MCG (5000 UT) capsule Take 1 capsule by mouth daily.   Yes [provider]  Ferrous Gluconate (IRON) 240 (27 Fe) MG TABS Take 1 tablet by mouth daily. 09/21/18  Yes [provider]  guaiFENesin-dextromethorphan (ROBITUSSIN DM) 100-10 MG/5ML syrup Take 10 mLs by mouth every 4 (four) hours as  needed for cough. 03/27/19  Yes Val Riles, MD  lisinopril (PRINIVIL,ZESTRIL) 20 MG tablet TAKE 1 TABLET BY MOUTH EVERY DAY 11/24/17  Yes Sowles, Drue Stager, MD  metroNIDAZOLE (METROGEL) 1 % gel APPLY TOPICALLY 1 TO 2 TIMES A DAY 06/19/19  Yes Brendolyn Patty, MD  pantoprazole (PROTONIX) 40 MG tablet Take 40 mg by mouth daily. 12/10/18  Yes [provider]  rosuvastatin (CRESTOR) 40 MG tablet Take 1 tablet (40 mg total) by mouth daily. 03/09/17  Yes Sowles, Drue Stager, MD  SYMBICORT 160-4.5 MCG/ACT inhaler Inhale 2 puffs into the lungs daily. 12/07/18  Yes [provider]    Allergies: Allergies  Allergen Reactions  . Hydromorphone     Other reaction(s): Hallucination Pt reports he doesn't know  about this    Social History:  reports that he quit smoking about 17 years ago. His smoking use included cigarettes. He started smoking about 63 years ago. He has a 46.00 pack-year smoking history. He quit smokeless tobacco use about 17 years ago.  His smokeless tobacco use included snuff. He reports current alcohol use of about 7.0 standard drinks of alcohol per week. He reports that he does not use drugs.   Family History: Family History  Problem Relation Age of Onset  . Hypertension Mother   . Heart disease Mother   . CAD Father   . Heart attack Father     Review of Systems: Review of Systems  Constitutional: Negative for chills and fever.  Eyes: Negative for blurred vision.  Respiratory: Negative for shortness of breath.   Cardiovascular: Negative for chest pain.  Gastrointestinal: Positive for abdominal pain. Negative for constipation, diarrhea, nausea and vomiting.  Genitourinary: Negative for dysuria.  Musculoskeletal: Negative for myalgias.  Skin: Negative for rash.  Neurological: Negative for dizziness.  Psychiatric/Behavioral: Negative for depression.    Physical Exam BP (!) 155/63   Pulse 80   Temp 97.9 F (36.6 C) (Oral)   Ht 6\' 2"  (1.88 m)   Wt 182 lb 3.2 oz (82.6 kg)   SpO2 92%   BMI 23.39 kg/m  CONSTITUTIONAL: No acute distress HEENT:  Normocephalic, atraumatic, extraocular motion intact. NECK: Trachea is midline, and there is no jugular venous distension.  RESPIRATORY:  Lungs are clear, and breath sounds are equal bilaterally. Normal respiratory effort without pathologic use of accessory muscles. CARDIOVASCULAR: Heart is regular without murmurs, gallops, or rubs. GI: The abdomen is soft, non-distended.  The patient has diastasis recti, with about 3 cm separation between rectus muscles.  The patient also has an umbilical hernia, with defect measuring about 1 cm.  There is tenderness when pushing deeply on the umbilical hernia site.   MUSCULOSKELETAL:   Normal muscle strength and tone in all four extremities.  No peripheral edema or cyanosis. SKIN: Skin turgor is normal. There are no pathologic skin lesions.  NEUROLOGIC:  Motor and sensation is grossly normal.  Cranial nerves are grossly intact. PSYCH:  Alert and oriented to person, place and time. Affect is normal.  Laboratory Analysis: No results found for this or any previous visit (from the past 24 hour(s)).  Imaging: No results found.  Assessment and Plan: This is a 80 y.o. male with umbilical hernia and diastasis recti.  --Discussed with the patient that the epigastric area of tenting is not a hernia but separation of the rectus muscles called diastasis recti.  However, he does have an umbilical hernia.  Discussed with him the thought process for repair.  With his medical comorbidities, I think  attempting repair of his umbilical hernia and plication of his diastasis recti will be too big of a surgery for him, compared to doing only his umbilical hernia.  There is a possible higher risk of recurrence of his hernia since the repair and the mesh placement would overlap in the diastasis which is weaker.  However, I think in his case, less is more.  He is in agreement. --Will schedule him for robotic assisted umbilical hernia repair on 05/07/20.  Discussed risks of bleeding, infection, and injury to surrounding structures, as well as post-op recovery and activity restrictions.  Will need both medical and cardiology clearance for him.  I'm unclear about which anticoagulants or antiplatelets he's on.  Discussed with him that he needs to send [place] the list of his medications that he's taking at home.  He or his daughter will call us tomorrow or send [place] a list of the medications that he's taking so we know what to do with medication interruption.  Discussed with him this would be 5 days for Aspirin, 7 days for Plavix, or 2 days for Eliquis. --Patient will call tomorrow with his  medications.  Face-to-face time spent with the patient and care providers was 80 minutes, with more than 50% of the time spent counseling, educating, and coordinating care of the patient.     Melvyn Neth, [facility]

## 2020-04-23 ENCOUNTER — Telehealth: Payer: Self-pay | Admitting: Surgery

## 2020-04-23 ENCOUNTER — Other Ambulatory Visit: Payer: Self-pay

## 2020-04-23 NOTE — Telephone Encounter (Signed)
Outgoing call is made, spoke both with patient and his daughter, [name] Browning.  They have been informed of  Pre-Admission date/time, COVID Testing date and Surgery date.  Surgery Date: 05/07/20 Preadmission Testing Date: 04/28/20 (phone 1p-5p) Covid Testing Date: not needed per pre-admit, patient fully vaccinated.    They also have been informed to call at [phone], between 1-3:00pm the day before surgery, to find out what time to arrive for surgery.

## 2020-04-28 ENCOUNTER — Other Ambulatory Visit: Payer: Self-pay

## 2020-04-28 ENCOUNTER — Encounter
Admission: RE | Admit: 2020-04-28 | Discharge: 2020-04-28 | Disposition: A | Discharge status: Home / Self Care | Payer: Medicare HMO | Source: Ambulatory Visit | Attending: Surgery | Admitting: Surgery

## 2020-04-28 HISTORY — DX: Other cervical disc degeneration, unspecified cervical region: M50.30

## 2020-04-28 HISTORY — DX: Unspecified atrial fibrillation: I48.91

## 2020-04-28 HISTORY — DX: Gastro-esophageal reflux disease without esophagitis: K21.9

## 2020-04-28 HISTORY — DX: Primary osteoarthritis, right shoulder: M19.011

## 2020-04-28 HISTORY — DX: Carpal tunnel syndrome, bilateral upper limbs: G56.03

## 2020-04-28 NOTE — Patient Instructions (Addendum)
Your procedure is scheduled on:  Thursday, April 28 Report to the Registration Desk on the 1st floor of the [facility]. To find out your arrival time, please call [phone] between 1PM - 3PM on: Wednesday, April 27  REMEMBER: Instructions that are not followed completely may result in serious medical risk, up to and including death; or upon the discretion of your surgeon and anesthesiologist your surgery may need to be rescheduled.  Do not eat food after midnight the night before surgery.  No gum chewing, lozengers or hard candies.  You may however, drink CLEAR liquids up to 2 hours before you are scheduled to arrive for your surgery. Do not drink anything within 2 hours of your scheduled arrival time.  Clear liquids include: - water  - apple juice without pulp - gatorade (not RED, PURPLE, OR BLUE) - black coffee or tea (Do NOT add milk or creamers to the coffee or tea) Do NOT drink anything that is not on this list.  TAKE THESE MEDICATIONS THE MORNING OF SURGERY WITH A SIP OF WATER:  1.  Pantoprazole (Protonix) - (take one the night before and one on the morning of surgery - helps to prevent nausea after surgery.) 2.  symbicort inhaler  Follow recommendations from Cardiologist, Pulmonologist or PCP regarding stopping Aspirin and Eliquis.  One week prior to surgery: starting April 21 Stop Anti-inflammatories (NSAIDS) such as Advil, Aleve, Ibuprofen, Motrin, Naproxen, Naprosyn and Aspirin based products such as Excedrin, Goodys Powder, BC Powder. Stop ANY OVER THE COUNTER supplements until after surgery.  No Alcohol for 24 hours before or after surgery.  No Smoking including e-cigarettes for 24 hours prior to surgery.  No chewable tobacco products for at least 6 hours prior to surgery.  No nicotine patches on the day of surgery.  Do not use any "recreational" drugs for at least a week prior to your surgery.  Please be advised that the combination of cocaine and anesthesia  may have negative outcomes, up to and including death. If you test positive for cocaine, your surgery will be cancelled.  On the morning of surgery brush your teeth with toothpaste and water, you may rinse your mouth with mouthwash if you wish. Do not swallow any toothpaste or mouthwash.  Do not wear jewelry.  Do not wear lotions, powders, or perfumes.   Do not shave body from the neck down 48 hours prior to surgery just in case you cut yourself which could leave a site for infection.  Also, freshly shaved skin may become irritated if using the CHG soap.  Contact lenses, hearing aids and dentures may not be worn into surgery.  Do not bring valuables to the hospital. [facility] is not responsible for any missing/lost belongings or valuables.   Use CHG Soap as directed on instruction sheet.  Notify your doctor if there is any change in your medical condition (cold, fever, infection).  Wear comfortable clothing (specific to your surgery type) to the hospital.  Plan for stool softeners for home use; pain medications have a tendency to cause constipation. You can also help prevent constipation by eating foods high in fiber such as fruits and vegetables and drinking plenty of fluids as your diet allows.  After surgery, you can help prevent lung complications by doing breathing exercises.  Take deep breaths and cough every 1-2 hours. Your doctor may order a device called an Incentive Spirometer to help you take deep breaths. When coughing or sneezing, hold a pillow firmly against  your incision with both hands. This is called "splinting." Doing this helps protect your incision. It also decreases belly discomfort.  If you are being discharged the day of surgery, you will not be allowed to drive home. You will need a responsible adult (18 years or older) to drive you home and stay with you that night.   If you are taking public transportation, you will need to have a responsible adult (18  years or older) with you. Please confirm with your physician that it is acceptable to use public transportation.   Please call the [place] Dept. at [phone] if you have any questions about these instructions.  Surgery Visitation Policy:  Patients undergoing a surgery or procedure may have one family member or support person with them as long as that person is not COVID-19 positive or experiencing its symptoms.  That person may remain in the waiting area during the procedure.

## 2020-04-28 NOTE — Progress Notes (Signed)
Cardiac clearance received with incorrect medication listed. I have refaxed a new request with the corrected medications listed to Dr Ubaldo Glassing.

## 2020-04-29 ENCOUNTER — Encounter
Admission: RE | Admit: 2020-04-29 | Discharge: 2020-04-29 | Disposition: A | Discharge status: Home / Self Care | Payer: Medicare HMO | Source: Ambulatory Visit | Attending: Surgery | Admitting: Surgery

## 2020-04-29 DIAGNOSIS — I4891 Unspecified atrial fibrillation: Secondary | ICD-10-CM | POA: Insufficient documentation

## 2020-04-29 DIAGNOSIS — I10 Essential (primary) hypertension: Secondary | ICD-10-CM | POA: Insufficient documentation

## 2020-04-29 DIAGNOSIS — Z01818 Encounter for other preprocedural examination: Secondary | ICD-10-CM | POA: Insufficient documentation

## 2020-04-29 LAB — COMPREHENSIVE METABOLIC PANEL
ALT: 17 U/L (ref 0–44)
AST: 25 U/L (ref 15–41)
Albumin: 3.9 g/dL (ref 3.5–5.0)
Alkaline Phosphatase: 124 U/L (ref 38–126)
Anion gap: 9 (ref 5–15)
BUN: 17 mg/dL (ref 8–23)
CO2: 26 mmol/L (ref 22–32)
Calcium: 9.2 mg/dL (ref 8.9–10.3)
Chloride: 104 mmol/L (ref 98–111)
Creatinine, Ser: 1.11 mg/dL (ref 0.61–1.24)
GFR, Estimated: >60 mL/min (ref >60)
Glucose, Bld: 102 mg/dL — ABNORMAL HIGH (ref 70–99)
Potassium: 3.8 mmol/L (ref 3.5–5.1)
Sodium: 139 mmol/L (ref 135–145)
Total Bilirubin: 0.6 mg/dL (ref 0.3–1.2)
Total Protein: 7.4 g/dL (ref 6.5–8.1)

## 2020-04-29 LAB — CBC
HCT: 40.7 % (ref 39.0–52.0)
Hemoglobin: 13.2 g/dL (ref 13.0–17.0)
MCH: 30 pg (ref 26.0–34.0)
MCHC: 32.4 g/dL (ref 30.0–36.0)
MCV: 92.5 fL (ref 80.0–100.0)
Platelets: 194 10*3/uL (ref 150–400)
RBC: 4.4 MIL/uL (ref 4.22–5.81)
RDW: 13.2 % (ref 11.5–15.5)
WBC: 7.4 10*3/uL (ref 4.0–10.5)
nRBC: 0 % (ref 0.0–0.2)

## 2020-04-30 ENCOUNTER — Encounter: Payer: Self-pay | Admitting: Surgery

## 2020-04-30 NOTE — Progress Notes (Signed)
Perioperative Services  Pre-Admission/Anesthesia Testing Clinical Review  Date: 05/06/20  Patient Demographics:  Name: [name] Browning DOB:   [birth year] MRN:   [id number]  Planned Surgical Procedure(s):    Case: [id number] Date/Time: 05/07/20 1020   Procedure: XI ROBOT ASSISTED UMBILICAL HERNIA REPAIR (N/A )   Anesthesia type: General   Pre-op diagnosis: umbilical hernia, diastasis recti   Location: [facility] OR ROOM 04 / [facility] ORS FOR ANESTHESIA GROUP   Surgeons: Olean Ree, MD    NOTE: Available PAT nursing documentation and vital signs have been reviewed. Clinical nursing staff has updated patient's PMH/PSHx, current medication list, and drug allergies/intolerances to ensure comprehensive history available to assist in medical decision making as it pertains to the aforementioned surgical procedure and anticipated anesthetic course.   Clinical Discussion:  [name] Browning is a 80 y.o. male who is submitted for pre-surgical anesthesia review and clearance prior to him undergoing the above procedure. Patient is a Former Smoker (46 pack years; quit 02/2003). Pertinent PMH includes: CAD, atrial fibrillation, aortic atherosclerosis, TIA, DVT, valvular insufficiency, HTN, HLD, COPD, GERD (on daily PPI), OA, cervical DDD, anxiety.  Patient is followed by cardiology Ubaldo Glassing, MD). He was last seen in the cardiology clinic on 03/06/2019; notes reviewed.  At the time of his clinic visit, patient reporting shortness of breath and palpitations.  He denied any chest pain, PND, orthopnea, vertiginous symptoms, peripheral edema, or presyncope/syncope.  Patient had recently been seen in the ED at [facility] for weakness, fatigue, shortness of breath, and palpitations in the setting of SARS-CoV-2.  Patient found to be in new onset atrial fibrillation. CHA2DS2-VASc Score = 6 (age x 2, HTN, TIA x 2, aortic plaque). Patient chronically anticoagulated using apixaban; compliant with therapy with no evidence of GI bleeding. He also  takes a daily low dose ASA.  Last TTE performed on 04/20/2015 revealed normal left ventricular systolic function with an EF of 55-60%. Previous cardiovascular testing included a myocardial perfusion imaging study performed on 05/20/2014 revealing no significant ischemia and an ejection fraction of 55 to 65%  (see full interpretation of cardiovascular testing below). Patient on GDMT for his HTN and HLD diagnoses.  Blood pressure reasonably controlled at 130/60 on currently prescribed CCB and ARB therapies.  Patient is on a statin for his HLD. Functional capacity, as defined by DASI, is documented as being >/= 4 METS.  No changes were made to patient's medication regimen.  Patient was scheduled for further noninvasive cardiovascular work-up including Holter monitor study and echocardiogram.  Patient to follow-up with outpatient cardiology in 4 months or sooner if needed.   Long-term cardiac event monitor study performed on 03/15/2019 revealed a predominantly underlying sinus bradycardia to sinus tachycardia with an episode of atrial fibrillation; minimum heart rate 47 bpm and maximum heart rate 171 bpm.    Echocardiogram was ordered, however patient did not have the procedure performed. Patient was lost to follow up; did not follow up as instructed.  Patient is scheduled for an elective hernia repair on 05/07/2020 with Dr. Ardath Sax.  Given patient's past medical history significant for cardiovascular diagnoses and multiple comorbidities, presurgical clearances were sought from internal medicine and cardiology.  Specialty clearances were obtained as follows    Per internal medicine, "this patient is optimized for surgery and may proceed with the planned procedural course with a LOW risk stratification". MD noting that patient can stop his clopidogrel for 3 days prior to surgery, however in review of his medical record, the patient is not  on clopidogrel. Per [facility] records, and notes from  cardiologist Ubaldo Glassing, MD), patient should be taking apixaban. This was confirmed by surgeon's office on 04/22/2020 (see order tracking). Interestingly enough, patient advised PAT RN that he was actually not taking either medication (clopidogrel or apixaban) at this time.    Per cardiology Ubaldo Glassing, MD), "this patient is optimized for surgery and may proceed with the planned procedural course with a LOW risk stratification". This patient is on daily anticoagulation therapy. He has been instructed on recommendations for holding his daily low doses ASA 5 days and his apixaban for 3 days prior to his procedure with plans to restart as soon as postoperative bleeding risk felt to be minimized by his attending surgeon. The patient has been instructed that his last dose of his ASA will be on 05/01/2020 and his last dose of apixaban will be on 05/03/2020.   Patient denies previous perioperative complications with anesthesia in the past. In review of the available records, it is noted that patient underwent a general anesthetic course here (ASA II) in 08/2017 without documented complications.   Vitals with BMI 04/28/2020 04/22/2020 03/27/2019  Height 6\' 2"  6\' 2"  -  Weight 182 lbs 182 lbs 3 oz -  BMI 72.53 66.44 -  Systolic - 034 742  Diastolic - 63 64  Pulse - 80 69    Providers/Specialists:   NOTE: Primary physician provider listed below. Patient may have been seen by APP or partner within same practice.   PROVIDER ROLE / SPECIALTY LAST Delight Stare, MD  General Surgery  04/22/2020  [facility]  Primary Care Provider  ???  Bartholome Bill, MD  Cardiology  03/06/2019   Allergies:  Hydromorphone  Current Home Medications:   No current facility-administered medications for this encounter.   Marland Kitchen aspirin EC 81 MG tablet  . atorvastatin (LIPITOR) 40 MG tablet  . cetirizine (ZYRTEC) 10 MG tablet  . Cholecalciferol 125 MCG (5000 UT) capsule  . Ferrous Gluconate (IRON) 240 (27 Fe) MG TABS  .  losartan (COZAAR) 100 MG tablet  . montelukast (SINGULAIR) 10 MG tablet  . pantoprazole (PROTONIX) 40 MG tablet  . SYMBICORT 160-4.5 MCG/ACT inhaler  . amLODipine (NORVASC) 10 MG tablet  . apixaban (ELIQUIS) 5 MG TABS tablet   History:   Past Medical History:  Diagnosis Date  . Anxiety   . Aortic atherosclerosis ([place])   . Atrial fibrillation ([place]) 01/2019  . Bilateral carpal tunnel syndrome   . CAD (coronary artery disease)   . Carpal tunnel syndrome, bilateral   . Chronic anticoagulation   . COPD (chronic obstructive pulmonary disease) ([place])   . COVID-19 virus infection 02/08/2019  . Degenerative disc disease, cervical   . DVT (deep venous thrombosis) ([place])   . GERD (gastroesophageal reflux disease)   . HLD (hyperlipidemia)   . Hypertension   . Kidney stones   . Leaky heart valve   . Normal coronary arteries    a. by cardiac cath in 2004; b. normal stress test in 2007 and 2011; c. Lexiscan 05/20/2014: no significant ischemia, EF 55-65%, no EKG changes, low risk study   . Osteoarthritis of right shoulder   . Pneumonia   . Skin cancer    skin cancer on right ear and right forehead removed  . Syncope   . TIA (transient ischemic attack) 2016   Past Surgical History:  Procedure Laterality Date  . CARDIAC CATHETERIZATION  2004  . CARPAL TUNNEL RELEASE Right 06/11/2013  .  SHOULDER ARTHROSCOPY WITH OPEN ROTATOR CUFF REPAIR Right 08/24/2017   Procedure: SHOULDER ARTHROSCOPY WITH OPEN ROTATOR CUFF REPAIR;  Surgeon: Corky Mull, MD;  Location: [facility] ORS;  Service: Orthopedics;  Laterality: Right;  . SKIN CANCER EXCISION  12/01/2016   right ear   . SKIN CANCER EXCISION     remove from the right side of the face   . THROMBECTOMY Right 2004   leg  . THYROIDECTOMY  1950   Not sure if total or partial thyroidectomy.   Family History  Problem Relation Age of Onset  . Hypertension Mother   . Heart disease Mother   . CAD Father   . Heart attack Father    Social History    Tobacco Use  . Smoking status: Former Smoker    Packs/day: 1.00    Years: 46.00    Pack years: 46.00    Types: Cigarettes    Start date: 01/10/1957    Quit date: 02/11/2003    Years since quitting: 17.2  . Smokeless tobacco: Former Systems developer    Types: Snuff    Quit date: 02/11/2003  Vaping Use  . Vaping Use: Never used  Substance Use Topics  . Alcohol use: Not Currently    Alcohol/week: 7.0 standard drinks    Types: 7 Cans of beer per week  . Drug use: No    Pertinent Clinical Results:  LABS: Labs reviewed: Acceptable for surgery.  No visits with results within 3 Day(s) from this visit.  Latest known visit with results is:  Hospital Outpatient Visit on 04/29/2020  Component Date Value Ref Range Status  . Sodium 04/29/2020 139  135 - 145 mmol/L Final  . Potassium 04/29/2020 3.8  3.5 - 5.1 mmol/L Final  . Chloride 04/29/2020 104  98 - 111 mmol/L Final  . CO2 04/29/2020 26  22 - 32 mmol/L Final  . Glucose, Bld 04/29/2020 102* 70 - 99 mg/dL Final   Glucose reference range applies only to samples taken after fasting for at least 8 hours.  . BUN 04/29/2020 17  8 - 23 mg/dL Final  . Creatinine, Ser 04/29/2020 1.11  0.61 - 1.24 mg/dL Final  . Calcium 04/29/2020 9.2  8.9 - 10.3 mg/dL Final  . Total Protein 04/29/2020 7.4  6.5 - 8.1 g/dL Final  . Albumin 04/29/2020 3.9  3.5 - 5.0 g/dL Final  . AST 04/29/2020 25  15 - 41 U/L Final  . ALT 04/29/2020 17  0 - 44 U/L Final  . Alkaline Phosphatase 04/29/2020 124  38 - 126 U/L Final  . Total Bilirubin 04/29/2020 0.6  0.3 - 1.2 mg/dL Final  . GFR, Estimated 04/29/2020 >60  >60 mL/min Final   Comment: (NOTE) Calculated using the CKD-EPI Creatinine Equation (2021)   . Anion gap 04/29/2020 9  5 - 15 Final   Performed at [facility], [address] [place], [place] [address]  . WBC 04/29/2020 7.4  4.0 - 10.5 K/uL Final  . RBC 04/29/2020 4.40  4.22 - 5.81 MIL/uL Final  . Hemoglobin 04/29/2020 13.2  13.0 - 17.0 g/dL Final  . HCT  04/29/2020 40.7  39.0 - 52.0 % Final  . MCV 04/29/2020 92.5  80.0 - 100.0 fL Final  . MCH 04/29/2020 30.0  26.0 - 34.0 pg Final  . MCHC 04/29/2020 32.4  30.0 - 36.0 g/dL Final  . RDW 04/29/2020 13.2  11.5 - 15.5 % Final  . Platelets 04/29/2020 194  150 - 400 K/uL Final  .  nRBC 04/29/2020 0.0  0.0 - 0.2 % Final   Performed at [facility], [address] [place], [place] [address]    ECG: Date: 04/29/2020 Time ECG obtained: 0903 AM Rate: 61 bpm Rhythm:  Sinus rhythm with PACs Axis (leads I and aVF): Normal Intervals: PR 206 ms. QRS 88 ms. QTc 410 ms. ST segment and T wave changes: No evidence of acute ST segment elevation or depression Comparison: Similar to previous tracing performed on 03/25/2019, however PACs now present.   IMAGING / PROCEDURES: CT ANGIO CHEST PE WITH OR WITHOUT CONTRAST performed on 03/25/2019 1. No evidence of acute pulmonary embolism. 2. There are features of peripheral ground-glass and interstitial opacities with admixed consolidation which could reflect a combination of residual atypical infection and edema as well as small bilateral pleural effusions with loculated components tracking in the fissures. These loculated collections appear to correspond to more nodular opacity seen on comparison radiograph. 3. Mediastinal and hilar lymphadenopathy, favor reactive. 4. Background of centrilobular and paraseptal emphysema 5. Extensive biapical pleuroparenchymal scarring with a more reticular appearance in the left apex centered upon a 1 cm nodule. This finding is nonspecific and could be postinflammatory in etiology. Recommend follow-up chest CT in 3 months. 6. Coronary artery calcifications. 7. Aortic atherosclerosis  ECHOCARDIOGRAM performed on 04/20/2015 1. LVEF 55 to 60% 2. Ventricular systolic function was normal 3. Left ventricular cavity size was normal 4. No regional wall motion abnormalities 5. Doppler parameters are consistent with abnormal  left ventricular relaxation (G1DD) 6. Mildly calcified mitral valve leaflets 7. Left atrium was normal in size 8. Right ventricular systolic function was normal 9. PASP within normal range  LEXISCAN performed on 05/20/2014 1. The left ventricular ejection fraction is normal at 55 to 65% 2. There were no ST segment deviations noted during stress or recovery on EKG 3. Pharmacological myocardial perfusion imaging study with no significant ischemia 4. Normal low risk study  BILATERAL CAROTID DOPPLER performed on 05/19/2014 1. Moderate amount of bilateral intimal thickening and atherosclerotic plaque that is morphologically similar to the 08/2013 examination and again not resulting in a hemodynamically significant stenosis  Impression and Plan:  [name] Browning has been referred for pre-anesthesia review and clearance prior to him undergoing the planned anesthetic and procedural courses. Available labs, pertinent testing, and imaging results were personally reviewed by me. This patient has been appropriately cleared by cardiology (LOW) and internal medicine  (LOW) with the noted risk of significant perioperative complications.  I have spoken with attending surgeon Hampton Abbot, MD) to make him aware of the issues with the patient's anticoagulants (see HPI). MD acknowledged and expressed plans to proceed with surgery tomorrow as planned. Please note, patient will require post-operative education regarding the importance of compliance with his anticoagulation therapy to reduce his risk of CVA in the setting of known atrial fibrillation (CHA2DS2-VASc Score = 6). With that being said, based on clinical review performed today (05/06/20), barring any significant acute changes in the patient's overall condition, it is anticipated that he will be able to proceed with the planned surgical intervention. Any acute changes in clinical condition may necessitate his procedure being postponed and/or cancelled. Patient will  meet with anesthesia team (MD and/or CRNA) on this day of his procedure for preoperative evaluation/assessment.   Pre-surgical instructions were reviewed with the patient during his PAT appointment and questions were fielded by PAT clinical staff. Patient was advised that if any questions or concerns arise prior to his procedure then he should return  a call to PAT and/or his surgeon's office to discuss.   Honor Loh, MSN, APRN, FNP-C, CEN [facility]  Peri-operative Services Nurse Practitioner Phone: [phone] 05/06/20 3:47 PM  NOTE: This note has been prepared using Dragon dictation software. Despite my best ability to proofread, there is always the potential that unintentional transcriptional errors may still occur from this process.

## 2020-05-01 ENCOUNTER — Telehealth: Payer: Self-pay

## 2020-05-01 NOTE — Progress Notes (Signed)
Cardiac Clearance has been received from Dr Bethanne Ginger office. The patient is cleared at Low risk for surgery. He may hold his Eliquis 3 days prior and may hold his Aspirin 5 days prior to surgery.

## 2020-05-01 NOTE — Telephone Encounter (Signed)
Spoke with the patient about his anti coagulant medications. He will stop his Aspirin 5 days before surgery, he will stop his Eliquis 3 days before surgery. He is aware of all instructions.

## 2020-05-04 DIAGNOSIS — F419 Anxiety disorder, unspecified: Secondary | ICD-10-CM | POA: Diagnosis not present

## 2020-05-04 DIAGNOSIS — I4891 Unspecified atrial fibrillation: Secondary | ICD-10-CM | POA: Diagnosis not present

## 2020-05-04 DIAGNOSIS — J9601 Acute respiratory failure with hypoxia: Secondary | ICD-10-CM | POA: Diagnosis not present

## 2020-05-04 DIAGNOSIS — R079 Chest pain, unspecified: Secondary | ICD-10-CM | POA: Diagnosis not present

## 2020-05-04 DIAGNOSIS — I10 Essential (primary) hypertension: Secondary | ICD-10-CM | POA: Diagnosis not present

## 2020-05-04 DIAGNOSIS — E7800 Pure hypercholesterolemia, unspecified: Secondary | ICD-10-CM | POA: Diagnosis not present

## 2020-05-05 ENCOUNTER — Encounter: Payer: Self-pay | Admitting: Surgery

## 2020-05-06 ENCOUNTER — Telehealth: Payer: Self-pay

## 2020-05-06 ENCOUNTER — Encounter: Payer: Self-pay | Admitting: Surgery

## 2020-05-06 MED ORDER — CHLORHEXIDINE GLUCONATE CLOTH 2 % EX PADS: 6.0000 | MEDICATED_PAD | Freq: Once | CUTANEOUS | Status: DC

## 2020-05-06 MED ORDER — CEFAZOLIN SODIUM-DEXTROSE 2-4 GM/100ML-% IV SOLN
2.0000 g | INTRAVENOUS | Status: AC
  Administered 2020-05-07: 2 g via INTRAVENOUS

## 2020-05-06 MED ORDER — LACTATED RINGERS IV SOLN: INTRAVENOUS | Status: DC

## 2020-05-06 MED ORDER — CHLORHEXIDINE GLUCONATE 0.12 % MT SOLN: 15.0000 mL | Freq: Once | OROMUCOSAL | Status: DC

## 2020-05-06 MED ORDER — GABAPENTIN 100 MG PO CAPS
200.0000 mg | ORAL_CAPSULE | ORAL | Status: AC
  Administered 2020-05-07: 200 mg via ORAL

## 2020-05-06 MED ORDER — ORAL CARE MOUTH RINSE: 15.0000 mL | Freq: Once | OROMUCOSAL | Status: DC

## 2020-05-06 MED ORDER — ACETAMINOPHEN 500 MG PO TABS
1000.0000 mg | ORAL_TABLET | ORAL | Status: AC
  Administered 2020-05-07: 1000 mg via ORAL

## 2020-05-06 MED ORDER — BUPIVACAINE LIPOSOME 1.3 % IJ SUSP: 20.0000 mL | Freq: Once | INTRAMUSCULAR | Status: DC

## 2020-05-06 NOTE — Telephone Encounter (Signed)
Received medical clearance from Dr. Ricci Barker.

## 2020-05-07 ENCOUNTER — Ambulatory Visit: Payer: Medicare HMO | Admitting: Urgent Care

## 2020-05-07 ENCOUNTER — Ambulatory Visit
Admission: RE | Admit: 2020-05-07 | Discharge: 2020-05-07 | Disposition: A | Discharge status: Home / Self Care | Payer: Medicare HMO | Attending: Surgery | Admitting: Surgery

## 2020-05-07 ENCOUNTER — Other Ambulatory Visit: Payer: Self-pay

## 2020-05-07 ENCOUNTER — Encounter
Admission: RE | Disposition: A | Discharge status: Home / Self Care | Payer: Self-pay | Source: Home / Self Care | Attending: Surgery

## 2020-05-07 ENCOUNTER — Encounter: Payer: Self-pay | Admitting: Surgery

## 2020-05-07 DIAGNOSIS — E785 Hyperlipidemia, unspecified: Secondary | ICD-10-CM | POA: Diagnosis not present

## 2020-05-07 DIAGNOSIS — I10 Essential (primary) hypertension: Secondary | ICD-10-CM | POA: Insufficient documentation

## 2020-05-07 DIAGNOSIS — Z8249 Family history of ischemic heart disease and other diseases of the circulatory system: Secondary | ICD-10-CM | POA: Insufficient documentation

## 2020-05-07 DIAGNOSIS — Z87891 Personal history of nicotine dependence: Secondary | ICD-10-CM | POA: Diagnosis not present

## 2020-05-07 DIAGNOSIS — Z86718 Personal history of other venous thrombosis and embolism: Secondary | ICD-10-CM | POA: Insufficient documentation

## 2020-05-07 DIAGNOSIS — Z85828 Personal history of other malignant neoplasm of skin: Secondary | ICD-10-CM | POA: Insufficient documentation

## 2020-05-07 DIAGNOSIS — Z885 Allergy status to narcotic agent status: Secondary | ICD-10-CM | POA: Insufficient documentation

## 2020-05-07 DIAGNOSIS — E890 Postprocedural hypothyroidism: Secondary | ICD-10-CM | POA: Diagnosis not present

## 2020-05-07 DIAGNOSIS — Z7902 Long term (current) use of antithrombotics/antiplatelets: Secondary | ICD-10-CM | POA: Insufficient documentation

## 2020-05-07 DIAGNOSIS — I4891 Unspecified atrial fibrillation: Secondary | ICD-10-CM | POA: Insufficient documentation

## 2020-05-07 DIAGNOSIS — K429 Umbilical hernia without obstruction or gangrene: Secondary | ICD-10-CM

## 2020-05-07 DIAGNOSIS — Z8673 Personal history of transient ischemic attack (TIA), and cerebral infarction without residual deficits: Secondary | ICD-10-CM | POA: Diagnosis not present

## 2020-05-07 DIAGNOSIS — Z7982 Long term (current) use of aspirin: Secondary | ICD-10-CM | POA: Diagnosis not present

## 2020-05-07 DIAGNOSIS — Z79899 Other long term (current) drug therapy: Secondary | ICD-10-CM | POA: Insufficient documentation

## 2020-05-07 DIAGNOSIS — Z7951 Long term (current) use of inhaled steroids: Secondary | ICD-10-CM | POA: Insufficient documentation

## 2020-05-07 HISTORY — DX: Atherosclerosis of aorta: I70.0

## 2020-05-07 HISTORY — DX: Long term (current) use of anticoagulants: Z79.01

## 2020-05-07 HISTORY — DX: Unspecified malignant neoplasm of skin, unspecified: C44.90

## 2020-05-07 HISTORY — DX: Atherosclerotic heart disease of native coronary artery without angina pectoris: I25.10

## 2020-05-07 HISTORY — DX: Carpal tunnel syndrome, bilateral upper limbs: G56.03

## 2020-05-07 SURGERY — REPAIR, HERNIA, UMBILICAL, ROBOT-ASSISTED
Anesthesia: General

## 2020-05-07 MED ORDER — FENTANYL CITRATE (PF) 100 MCG/2ML IJ SOLN
INTRAMUSCULAR | Status: AC
  Filled 2020-05-07: qty 2

## 2020-05-07 MED ORDER — PROPOFOL 10 MG/ML IV BOLUS
INTRAVENOUS | Status: DC | PRN
  Administered 2020-05-07: 120 mg via INTRAVENOUS

## 2020-05-07 MED ORDER — KETOROLAC TROMETHAMINE 15 MG/ML IJ SOLN
INTRAMUSCULAR | Status: AC
  Filled 2020-05-07: qty 1

## 2020-05-07 MED ORDER — BUPIVACAINE LIPOSOME 1.3 % IJ SUSP
INTRAMUSCULAR | Status: AC
  Filled 2020-05-07: qty 20

## 2020-05-07 MED ORDER — SUGAMMADEX SODIUM 500 MG/5ML IV SOLN
INTRAVENOUS | Status: DC | PRN
  Administered 2020-05-07: 400 mg via INTRAVENOUS

## 2020-05-07 MED ORDER — PHENYLEPHRINE HCL (PRESSORS) 10 MG/ML IV SOLN
INTRAVENOUS | Status: DC | PRN
  Administered 2020-05-07: 100 ug via INTRAVENOUS

## 2020-05-07 MED ORDER — BUPIVACAINE LIPOSOME 1.3 % IJ SUSP
INTRAMUSCULAR | Status: DC | PRN
  Administered 2020-05-07: 20 mL

## 2020-05-07 MED ORDER — SODIUM CHLORIDE FLUSH 0.9 % IV SOLN
INTRAVENOUS | Status: AC
  Filled 2020-05-07: qty 10

## 2020-05-07 MED ORDER — OXYCODONE HCL 5 MG PO TABS: 5.0000 mg | ORAL_TABLET | ORAL | 0 refills | Status: DC | PRN

## 2020-05-07 MED ORDER — FENTANYL CITRATE (PF) 100 MCG/2ML IJ SOLN
INTRAMUSCULAR | Status: DC | PRN
  Administered 2020-05-07 (×4): 50 ug via INTRAVENOUS

## 2020-05-07 MED ORDER — GABAPENTIN 100 MG PO CAPS
ORAL_CAPSULE | ORAL | Status: AC
  Filled 2020-05-07: qty 2

## 2020-05-07 MED ORDER — LIDOCAINE HCL (CARDIAC) PF 100 MG/5ML IV SOSY
PREFILLED_SYRINGE | INTRAVENOUS | Status: DC | PRN
  Administered 2020-05-07: 100 mg via INTRAVENOUS

## 2020-05-07 MED ORDER — KETOROLAC TROMETHAMINE 30 MG/ML IJ SOLN
INTRAMUSCULAR | Status: AC
  Filled 2020-05-07: qty 1

## 2020-05-07 MED ORDER — GLYCOPYRROLATE 0.2 MG/ML IJ SOLN
INTRAMUSCULAR | Status: DC | PRN
  Administered 2020-05-07: .2 mg via INTRAVENOUS

## 2020-05-07 MED ORDER — CHLORHEXIDINE GLUCONATE 0.12 % MT SOLN
OROMUCOSAL | Status: AC
  Filled 2020-05-07: qty 15

## 2020-05-07 MED ORDER — BUPIVACAINE-EPINEPHRINE 0.25% -1:200000 IJ SOLN
INTRAMUSCULAR | Status: DC | PRN
  Administered 2020-05-07: 30 mL

## 2020-05-07 MED ORDER — OXYCODONE HCL 5 MG PO TABS
5.0000 mg | ORAL_TABLET | ORAL | Status: DC | PRN
  Administered 2020-05-07: 5 mg via ORAL

## 2020-05-07 MED ORDER — FENTANYL CITRATE (PF) 100 MCG/2ML IJ SOLN
25.0000 ug | INTRAMUSCULAR | Status: DC | PRN
  Administered 2020-05-07: 50 ug via INTRAVENOUS

## 2020-05-07 MED ORDER — BUPIVACAINE-EPINEPHRINE (PF) 0.25% -1:200000 IJ SOLN
INTRAMUSCULAR | Status: AC
  Filled 2020-05-07: qty 30

## 2020-05-07 MED ORDER — KETOROLAC TROMETHAMINE 30 MG/ML IJ SOLN
15.0000 mg | Freq: Once | INTRAMUSCULAR | Status: AC
  Administered 2020-05-07: 15 mg via INTRAVENOUS

## 2020-05-07 MED ORDER — IBUPROFEN 600 MG PO TABS
600.0000 mg | ORAL_TABLET | Freq: Three times a day (TID) | ORAL | 1 refills | Status: DC | PRN
Start: 2020-05-07 — End: 2020-09-28

## 2020-05-07 MED ORDER — ACETAMINOPHEN 500 MG PO TABS
ORAL_TABLET | ORAL | Status: AC
  Filled 2020-05-07: qty 2

## 2020-05-07 MED ORDER — CEFAZOLIN SODIUM-DEXTROSE 2-4 GM/100ML-% IV SOLN
INTRAVENOUS | Status: AC
  Filled 2020-05-07: qty 100

## 2020-05-07 MED ORDER — DEXMEDETOMIDINE (PRECEDEX) IN NS 20 MCG/5ML (4 MCG/ML) IV SYRINGE
PREFILLED_SYRINGE | INTRAVENOUS | Status: DC | PRN
  Administered 2020-05-07: 12 ug via INTRAVENOUS

## 2020-05-07 MED ORDER — ACETAMINOPHEN 500 MG PO TABS: 1000.0000 mg | ORAL_TABLET | Freq: Four times a day (QID) | ORAL | Status: DC | PRN

## 2020-05-07 MED ORDER — ONDANSETRON HCL 4 MG/2ML IJ SOLN
INTRAMUSCULAR | Status: DC | PRN
  Administered 2020-05-07 (×2): 4 mg via INTRAVENOUS

## 2020-05-07 MED ORDER — EPHEDRINE SULFATE 50 MG/ML IJ SOLN
INTRAMUSCULAR | Status: DC | PRN
  Administered 2020-05-07 (×2): 5 mg via INTRAVENOUS

## 2020-05-07 MED ORDER — DEXAMETHASONE SODIUM PHOSPHATE 10 MG/ML IJ SOLN
INTRAMUSCULAR | Status: DC | PRN
  Administered 2020-05-07: 10 mg via INTRAVENOUS

## 2020-05-07 MED ORDER — OXYCODONE HCL 5 MG PO TABS
ORAL_TABLET | ORAL | Status: AC
  Filled 2020-05-07: qty 1

## 2020-05-07 MED ORDER — SUGAMMADEX SODIUM 500 MG/5ML IV SOLN
INTRAVENOUS | Status: AC
  Filled 2020-05-07: qty 5

## 2020-05-07 MED ORDER — ROCURONIUM BROMIDE 100 MG/10ML IV SOLN
INTRAVENOUS | Status: DC | PRN
  Administered 2020-05-07: 20 mg via INTRAVENOUS
  Administered 2020-05-07: 50 mg via INTRAVENOUS

## 2020-05-07 SURGICAL SUPPLY — 60 items
"PENCIL ELECTRO HAND CTR " (MISCELLANEOUS) ×1 IMPLANT
ADH SKN CLS APL DERMABOND .7 (GAUZE/BANDAGES/DRESSINGS) ×1
APL PRP STRL LF DISP 70% ISPRP (MISCELLANEOUS) ×1
BLADE SURG SZ11 CARB STEEL (BLADE) ×2 IMPLANT
CANISTER SUCT 1200ML W/VALVE (MISCELLANEOUS) ×1 IMPLANT
CANNULA REDUC XI 12-8 STAPL (CANNULA) ×1
CANNULA REDUCER 12-8 DVNC XI (CANNULA) ×1 IMPLANT
CHLORAPREP W/TINT 26 (MISCELLANEOUS) ×2 IMPLANT
COVER TIP SHEARS 8 DVNC (MISCELLANEOUS) ×1 IMPLANT
COVER TIP SHEARS 8MM DA VINCI (MISCELLANEOUS) ×1
COVER WAND RF STERILE (DRAPES) ×2 IMPLANT
DEFOGGER SCOPE WARMER CLEARIFY (MISCELLANEOUS) ×2 IMPLANT
DERMABOND ADVANCED (GAUZE/BANDAGES/DRESSINGS) ×1
DERMABOND ADVANCED .7 DNX12 (GAUZE/BANDAGES/DRESSINGS) ×1 IMPLANT
DRAPE ARM DVNC X/XI (DISPOSABLE) ×4 IMPLANT
DRAPE COLUMN DVNC XI (DISPOSABLE) ×1 IMPLANT
DRAPE DA VINCI XI ARM (DISPOSABLE) ×4
DRAPE DA VINCI XI COLUMN (DISPOSABLE) ×1
ELECT CAUTERY BLADE 6.4 (BLADE) ×2 IMPLANT
ELECT REM PT RETURN 9FT ADLT (ELECTROSURGICAL) ×2
ELECTRODE REM PT RTRN 9FT ADLT (ELECTROSURGICAL) ×1 IMPLANT
GLOVE SURG SYN 7.0 (GLOVE) ×10 IMPLANT
GLOVE SURG SYN 7.0 PF PI (GLOVE) ×2 IMPLANT
GLOVE SURG SYN 7.5  E (GLOVE) ×5
GLOVE SURG SYN 7.5 E (GLOVE) ×5 IMPLANT
GLOVE SURG SYN 7.5 PF PI (GLOVE) ×2 IMPLANT
GOWN STRL REUS W/ TWL LRG LVL3 (GOWN DISPOSABLE) ×3 IMPLANT
GOWN STRL REUS W/TWL LRG LVL3 (GOWN DISPOSABLE) ×10
GRASPER SUT TROCAR 14GX15 (MISCELLANEOUS) ×2 IMPLANT
IRRIGATION STRYKERFLOW (MISCELLANEOUS) IMPLANT
IRRIGATOR STRYKERFLOW (MISCELLANEOUS)
IV NS 1000ML (IV SOLUTION)
IV NS 1000ML BAXH (IV SOLUTION) IMPLANT
KIT PINK PAD W/HEAD ARE REST (MISCELLANEOUS) ×2
KIT PINK PAD W/HEAD ARM REST (MISCELLANEOUS) ×1 IMPLANT
LABEL OR SOLS (LABEL) ×2 IMPLANT
MANIFOLD NEPTUNE II (INSTRUMENTS) ×2 IMPLANT
MESH VENT LT ST 11.4CM CRL (Mesh General) ×1 IMPLANT
NDL INSUFFLATION 14GA 120MM (NEEDLE) ×1 IMPLANT
NEEDLE HYPO 22GX1.5 SAFETY (NEEDLE) ×2 IMPLANT
NEEDLE INSUFFLATION 14GA 120MM (NEEDLE) ×2 IMPLANT
OBTURATOR OPTICAL STANDARD 8MM (TROCAR) ×1
OBTURATOR OPTICAL STND 8 DVNC (TROCAR) ×1
OBTURATOR OPTICALSTD 8 DVNC (TROCAR) ×1 IMPLANT
PACK LAP CHOLECYSTECTOMY (MISCELLANEOUS) ×2 IMPLANT
PENCIL ELECTRO HAND CTR (MISCELLANEOUS) ×2 IMPLANT
SEAL CANN UNIV 5-8 DVNC XI (MISCELLANEOUS) ×2 IMPLANT
SEAL XI 5MM-8MM UNIVERSAL (MISCELLANEOUS) ×2
SET TUBE SMOKE EVAC HIGH FLOW (TUBING) ×2 IMPLANT
SOLUTION ELECTROLUBE (MISCELLANEOUS) ×2 IMPLANT
SPONGE LAP 18X18 RF (DISPOSABLE) ×2 IMPLANT
STAPLER CANNULA SEAL DVNC XI (STAPLE) ×1 IMPLANT
STAPLER CANNULA SEAL XI (STAPLE) ×1
SUT MNCRL 4-0 (SUTURE) ×2
SUT MNCRL 4-0 27XMFL (SUTURE) ×1
SUT STRATAFIX PDS 30 CT-1 (SUTURE) ×2 IMPLANT
SUT VICRYL 0 AB UR-6 (SUTURE) ×4 IMPLANT
SUT VLOC 90 2/L VL 12 GS22 (SUTURE) ×4 IMPLANT
SUTURE MNCRL 4-0 27XMF (SUTURE) ×1 IMPLANT
TRAY FOLEY SLVR 16FR LF STAT (SET/KITS/TRAYS/PACK) ×2 IMPLANT

## 2020-05-07 NOTE — Anesthesia Postprocedure Evaluation (Signed)
Anesthesia Post Note  Patient: [name] Browning  Procedure(s) Performed: XI ROBOT ASSISTED UMBILICAL HERNIA REPAIR (N/A )  Patient location during evaluation: PACU Anesthesia Type: General Level of consciousness: awake and alert Pain management: pain level controlled Vital Signs Assessment: post-procedure vital signs reviewed and stable Respiratory status: spontaneous breathing and respiratory function stable Cardiovascular status: stable Anesthetic complications: no   No complications documented.   Last Vitals:  Vitals:   05/07/20 1215 05/07/20 1220  BP: (!) 154/78   Pulse: 86 75  Resp: (!) 21 15  Temp:    SpO2: 100% 100%    Last Pain:  Vitals:   05/07/20 0854  TempSrc: Oral                 Taraann Olthoff K

## 2020-05-07 NOTE — Anesthesia Procedure Notes (Signed)
Procedure Name: Intubation Performed by: Fletcher-Harrison, Char Feltman, CRNA Pre-anesthesia Checklist: Patient identified, Emergency Drugs available, Suction available and Patient being monitored Patient Re-evaluated:Patient Re-evaluated prior to induction Oxygen Delivery Method: Circle system utilized Preoxygenation: Pre-oxygenation with 100% oxygen Induction Type: IV induction Ventilation: Mask ventilation without difficulty Laryngoscope Size: McGraph and 3 Grade View: Grade I Tube type: Oral Tube size: 6.5 mm Number of attempts: 1 Airway Equipment and Method: Stylet and Oral airway Placement Confirmation: ETT inserted through vocal cords under direct vision,  positive ETCO2,  breath sounds checked- equal and bilateral and CO2 detector Secured at: 21 cm Tube secured with: Tape Dental Injury: Teeth and Oropharynx as per pre-operative assessment        

## 2020-05-07 NOTE — Discharge Instructions (Signed)
AMBULATORY SURGERY  DISCHARGE INSTRUCTIONS   1) The drugs that you were given will stay in your system until tomorrow so for the next 24 hours you should not:  A) Drive an automobile B) Make any legal decisions C) Drink any alcoholic beverage   2) You may resume regular meals tomorrow.  Today it is better to start with liquids and gradually work up to solid foods.  You may eat anything you prefer, but it is better to start with liquids, then soup and crackers, and gradually work up to solid foods.   3) Please notify your doctor immediately if you have any unusual bleeding, trouble breathing, redness and pain at the surgery site, drainage, fever, or pain not relieved by medication.    4) Additional Instructions: AMBULATORY SURGERY  DISCHARGE INSTRUCTIONS   5) The drugs that you were given will stay in your system until tomorrow so for the next 24 hours you should not:  D) Drive an automobile E) Make any legal decisions F) Drink any alcoholic beverage   6) You may resume regular meals tomorrow.  Today it is better to start with liquids and gradually work up to solid foods.  You may eat anything you prefer, but it is better to start with liquids, then soup and crackers, and gradually work up to solid foods.   7) Please notify your doctor immediately if you have any unusual bleeding, trouble breathing, redness and pain at the surgery site, drainage, fever, or pain not relieved by medication.    8) Additional Instructions:        Please contact your physician with any problems or Same Day Surgery at [phone], Monday through Friday 6 am to 4 pm, or [place] at [facility] number at [phone].Bupivacaine Liposomal Suspension for Injection What is this medicine? BUPIVACAINE LIPOSOMAL (bue PIV a kane LIP oh som al) is an anesthetic. It causes loss of feeling in the skin or other tissues. It is used to prevent and to treat pain from some procedures. This medicine  may be used for other purposes; ask your health care provider or pharmacist if you have questions. COMMON BRAND NAME(S): EXPAREL What should I tell my health care provider before I take this medicine? They need to know if you have any of these conditions:  G6PD deficiency  heart disease  kidney disease  liver disease  low blood pressure  lung or breathing disease, like asthma  an unusual or allergic reaction to bupivacaine, other medicines, foods, dyes, or preservatives  pregnant or trying to get pregnant  breast-feeding How should I use this medicine? This medicine is injected into the affected area. It is given by a health care provider in a hospital or clinic setting. Talk to your health care provider about the use of this medicine in children. While it may be given to children as young as 6 years for selected conditions, precautions do apply. Overdosage: If you think you have taken too much of this medicine contact a poison control center or emergency room at once. NOTE: This medicine is only for you. Do not share this medicine with others. What if I miss a dose? This does not apply. What may interact with this medicine? This medicine may interact with the following medications:  acetaminophen  certain antibiotics like dapsone, nitrofurantoin, aminosalicylic acid, sulfonamides  certain medicines for seizures like phenobarbital, phenytoin, valproic acid  chloroquine  cyclophosphamide  flutamide  hydroxyurea  ifosfamide  metoclopramide  nitric oxide  nitroglycerin  nitroprusside  nitrous oxide  other local anesthetics like lidocaine, pramoxine, tetracaine  primaquine  quinine  rasburicase  sulfasalazine This list may not describe all possible interactions. Give your health care provider a list of all the medicines, herbs, non-prescription drugs, or dietary supplements you use. Also tell them if you smoke, drink alcohol, or use illegal drugs. Some  items may interact with your medicine. What should I watch for while using this medicine? Your condition will be monitored carefully while you are receiving this medicine. Be careful to avoid injury while the area is numb, and you are not aware of pain. What side effects may I notice from receiving this medicine? Side effects that you should report to your doctor or health care professional as soon as possible:  allergic reactions like skin rash, itching or hives, swelling of the face, lips, or tongue  seizures  signs and symptoms of a dangerous change in heartbeat or heart rhythm like chest pain; dizziness; fast, irregular heartbeat; palpitations; feeling faint or lightheaded; falls; breathing problems  signs and symptoms of methemoglobinemia such as pale, gray, or blue colored skin; headache; fast heartbeat; shortness of breath; feeling faint or lightheaded, falls; tiredness Side effects that usually do not require medical attention (report to your doctor or health care professional if they continue or are bothersome):  anxious  back pain  changes in taste  changes in vision  constipation  dizziness  fever  nausea, vomiting This list may not describe all possible side effects. Call your doctor for medical advice about side effects. You may report side effects to FDA at 1-800-FDA-1088. Where should I keep my medicine? This drug is given in a hospital or clinic and will not be stored at home. NOTE: This sheet is a summary. It may not cover all possible information. If you have questions about this medicine, talk to your doctor, pharmacist, or health care provider.  2021 Elsevier/Gold Standard (2019-04-04 12:24:57)   Please contact your physician with any problems or Same Day Surgery at [phone], Monday through Friday 6 am to 4 pm, or [place] at [facility] number at [phone].

## 2020-05-07 NOTE — Anesthesia Preprocedure Evaluation (Addendum)
Anesthesia Evaluation  Patient identified by MRN, date of birth, ID band Patient awake    Reviewed: Allergy & Precautions, H&P , NPO status , Patient's Chart, lab work & pertinent test results  History of Anesthesia Complications Negative for: history of anesthetic complications  Airway Mallampati: III  TM Distance: >3 FB Neck ROM: limited    Dental  (+) Chipped, Poor Dentition, Missing   Pulmonary shortness of breath and with exertion, pneumonia, COPD, former smoker,    Pulmonary exam normal        Cardiovascular Exercise Tolerance: Good hypertension, (-) angina+ CAD  + dysrhythmias Atrial Fibrillation      Neuro/Psych PSYCHIATRIC DISORDERS TIA Neuromuscular disease    GI/Hepatic Neg liver ROS, GERD  Medicated and Controlled,  Endo/Other  negative endocrine ROS  Renal/GU Renal disease     Musculoskeletal  (+) Arthritis ,   Abdominal   Peds  Hematology negative hematology ROS (+)   Anesthesia Other Findings Patient has cardiac clearance for this procedure.   Past Medical History: No date: Anxiety No date: Aortic atherosclerosis ([place]) 01/2019: Atrial fibrillation (HCC) No date: Bilateral carpal tunnel syndrome No date: CAD (coronary artery disease) No date: Carpal tunnel syndrome, bilateral No date: Chronic anticoagulation No date: COPD (chronic obstructive pulmonary disease) ([place]) 02/08/2019: COVID-19 virus infection No date: Degenerative disc disease, cervical No date: DVT (deep venous thrombosis) (HCC) No date: GERD (gastroesophageal reflux disease) No date: HLD (hyperlipidemia) No date: Hypertension No date: Kidney stones No date: Leaky heart valve No date: Normal coronary arteries     Comment:  a. by cardiac cath in 2004; b. normal stress test in               2007 and 2011; c. Lexiscan 05/20/2014: no significant               ischemia, EF 55-65%, no EKG changes, low risk study  No date:  Osteoarthritis of right shoulder No date: Pneumonia No date: Skin cancer     Comment:  skin cancer on right ear and right forehead removed No date: Syncope 2016: TIA (transient ischemic attack)  Past Surgical History: 2004: CARDIAC CATHETERIZATION 06/11/2013: CARPAL TUNNEL RELEASE; Right 08/24/2017: SHOULDER ARTHROSCOPY WITH OPEN ROTATOR CUFF REPAIR; Right     Comment:  Procedure: SHOULDER ARTHROSCOPY WITH OPEN ROTATOR CUFF               REPAIR;  Surgeon: Corky Mull, MD;  Location: [facility] ORS;              Service: Orthopedics;  Laterality: Right; 12/01/2016: SKIN CANCER EXCISION     Comment:  right ear  No date: SKIN CANCER EXCISION     Comment:  remove from the right side of the face  2004: THROMBECTOMY; Right     Comment:  leg 1950: THYROIDECTOMY     Comment:  Not sure if total or partial thyroidectomy.     Reproductive/Obstetrics negative OB ROS                            Anesthesia Physical Anesthesia Plan  ASA: III  Anesthesia Plan: General ETT   Post-op Pain Management:    Induction: Intravenous  PONV Risk Score and Plan: Ondansetron, Dexamethasone, Midazolam and Treatment may vary due to age or medical condition  Airway Management Planned: Oral ETT  Additional Equipment:   Intra-op Plan:   Post-operative Plan: Extubation in OR  Informed Consent: I have reviewed  the patients History and Physical, chart, labs and discussed the procedure including the risks, benefits and alternatives for the proposed anesthesia with the patient or authorized representative who has indicated his/her understanding and acceptance.     Dental Advisory Given  Plan Discussed with: Anesthesiologist, CRNA and Surgeon  Anesthesia Plan Comments: (Patient consented for risks of anesthesia including but not limited to:  - adverse reactions to medications - damage to eyes, teeth, lips or other oral mucosa - nerve damage due to positioning  - sore throat or  hoarseness - Damage to heart, brain, nerves, lungs, other parts of body or loss of life  Patient voiced understanding.)        Anesthesia Quick Evaluation

## 2020-05-07 NOTE — Op Note (Signed)
  Procedure Date:  05/07/2020  Pre-operative Diagnosis:  Umbilical hernia  Post-operative Diagnosis:  Umbilical hernia  Procedure:  Robotic assisted Umbilical Hernia Repair with mesh  Surgeon:  Melvyn Neth, MD  Assistant:  Wendall Papa, PA-S  Anesthesia:  General endotracheal  Estimated Blood Loss:  10 ml  Specimens:  None  Complications:  None  Indications for Procedure:  This is a 80 y.o. male who presents with an umbilical hernia.  The options of surgery versus observation were reviewed with the patient and/or family. The risks of bleeding, abscess or infection, recurrence of symptoms, potential for an open procedure, injury to surrounding structures, and chronic pain were all discussed with the patient and was willing to proceed.  Description of Procedure: The patient was correctly identified in the preoperative area and brought into the operating room.  The patient was placed supine with VTE prophylaxis in place.  Appropriate time-outs were performed.  Anesthesia was induced and the patient was intubated.  Appropriate antibiotics were infused.  The abdomen was prepped and draped in a sterile fashion. The patient's hernia defect was marked with a marking pen.  A Veress needle was introduced in the left upper quadrant and pneumoperitoneum was obtained with appropriate pressures.  An 8 mm robotic port was placed in the left lateral wall under OptiVue technique without injury.  Then, a 12 mm port was placed in LUQ, and an 8 mm port was placed in the LLQ under direct visulization.  A 4.5 inch Bard Ventralight ST Echo mesh was placed via the 12-mm port in addition to a 0 Stratafix and 2-0 Vloc sutures.  The DaVinci platform was docked, camera targeted, and instruments placed under direct visualization.  The hernia sac was reduced using a combination of blunt dissection and cautery with careful attention not to injure the bowel.  The fascial edges were cleared using cautery and  scissors to allow for better landing site for the mesh.  The umbilical hernia defect measured approximately 2 cm.  The defect was closed using the 0 Stratafix suture, incorporating the underside of the umbilicus to tack it back down.  A PMI was then placed through the center of the prior defect and the positioning tubing was passed through.  The positioning system was insufflated and the mesh was placed over the defect covering it entirely and with good overlap.  The mesh was then secured circumferentially using the 2-0 vlock sutures.  The needles were removed under direct visulization, as well as some of the preperitoneal fat that had been dissected.  The mesh was well spread, in good location, without any defects or curling.  The DaVinci platform was then undocked.  The 12-mm port was removed and the fascia was closed under direct visualization utilizing an Endo Close technique with 0 Vicryl interrupted sutures.  The 8 mm ports were removed.  Local anesthetic was infused in all incisions and the incisions were closed with 4-0 Monocryl.  The wounds were cleaned and sealed with DermaBond.  The patient was emerged from anesthesia and extubated and brought to the recovery room for further management.  The patient tolerated the procedure well and all counts were correct at the end of the case.   Melvyn Neth, MD

## 2020-05-07 NOTE — Interval H&P Note (Signed)
History and Physical Interval Note:  05/07/2020 9:07 AM  [name] Browning  has presented today for surgery, with the diagnosis of umbilical hernia, diastasis recti.  The various methods of treatment have been discussed with the patient and family. After consideration of risks, benefits and other options for treatment, the patient has consented to  Procedure(s): XI [place] (N/A) as a surgical intervention.  The patient's history has been reviewed, patient examined, no change in status, stable for surgery.  I have reviewed the patient's chart and labs.  Questions were answered to the patient's satisfaction.     Toni Hoffmeister

## 2020-05-07 NOTE — Transfer of Care (Signed)
Immediate Anesthesia Transfer of Care Note  Patient: [name] Browning  Procedure(s) Performed: XI ROBOT ASSISTED UMBILICAL HERNIA REPAIR (N/A )  Patient Location: PACU  Anesthesia Type:General  Level of Consciousness: awake and patient cooperative  Airway & Oxygen Therapy: Patient Spontanous Breathing and Patient connected to face mask oxygen  Post-op Assessment: Report given to RN and Post -op Vital signs reviewed and stable  Post vital signs: Reviewed and stable  Last Vitals:  Vitals Value Taken Time  BP 154/78 05/07/20 1215  Temp    Pulse 89 05/07/20 1221  Resp 16 05/07/20 1221  SpO2 100 % 05/07/20 1221  Vitals shown include unvalidated device data.  Last Pain:  Vitals:   05/07/20 0854  TempSrc: Oral         Complications: No complications documented.

## 2020-05-11 ENCOUNTER — Telehealth: Payer: Self-pay

## 2020-05-11 NOTE — Telephone Encounter (Signed)
Patient's daughter called with post operative questions about pain control and constipation. Instructed to rotate the patient's Tylenol and Ibuprofen every 4 hours and he may also take the Oxycodone with this. He may use Miralax 1-2 times a day with a full glass of liquids for constipation. She is aware to call back with any further questions.

## 2020-05-28 ENCOUNTER — Encounter: Payer: Medicare HMO | Admitting: Physician Assistant

## 2020-06-02 ENCOUNTER — Ambulatory Visit (INDEPENDENT_AMBULATORY_CARE_PROVIDER_SITE_OTHER): Payer: Medicare HMO | Admitting: Physician Assistant

## 2020-06-02 ENCOUNTER — Other Ambulatory Visit: Payer: Self-pay

## 2020-06-02 ENCOUNTER — Encounter: Payer: Self-pay | Admitting: Physician Assistant

## 2020-06-02 VITALS — BP 130/79 | HR 78 | Temp 97.8°F | Ht 74.0 in | Wt 184.0 lb

## 2020-06-02 DIAGNOSIS — Z09 Encounter for follow-up examination after completed treatment for conditions other than malignant neoplasm: Secondary | ICD-10-CM

## 2020-06-02 DIAGNOSIS — K429 Umbilical hernia without obstruction or gangrene: Secondary | ICD-10-CM

## 2020-06-02 NOTE — Patient Instructions (Addendum)
The nausea should improve with time. Call us if you have any further questions.   GENERAL POST-OPERATIVE PATIENT INSTRUCTIONS   WOUND CARE INSTRUCTIONS: Try to keep the wound dry and avoid ointments on the wound unless directed to do so.  If the wound becomes bright red and painful or starts to drain infected material that is not clear, please contact your physician immediately.  If the wound is mildly pink and has a thick firm ridge underneath it, this is normal, and is referred to as a healing ridge.  This will resolve over the next 4-6 weeks.  BATHING: You may shower if you have been informed of this by your surgeon. However, Please do not submerge in a tub, hot tub, or pool until incisions are completely sealed or have been told by your surgeon that you may do so.  DIET:  You may eat any foods that you can tolerate.  It is a good idea to eat a high fiber diet and take in plenty of fluids to prevent constipation.  If you do become constipated you may want to take a mild laxative or take ducolax tablets on a daily basis until your bowel habits are regular.  Constipation can be very uncomfortable, along with straining, after recent surgery.  ACTIVITY: You may want to hug a pillow when coughing and sneezing to add additional support to the surgical area, if you had abdominal or chest surgery, which will decrease pain during these times.  You are encouraged to walk and engage in light activity for the next two weeks.  You should not lift more than 20 pounds, until 06/18/2020 as it could put you at increased risk for complications.  Twenty pounds is roughly equivalent to a plastic bag of groceries. At that time- Listen to your body when lifting, if you have pain when lifting, stop and then try again in a few days. Soreness after doing exercises or activities of daily living is normal as you get back in to your normal routine.  MEDICATIONS:  Try to take narcotic medications and anti-inflammatory  medications, such as tylenol, ibuprofen, naprosyn, etc., with food.  This will minimize stomach upset from the medication.  Should you develop nausea and vomiting from the pain medication, or develop a rash, please discontinue the medication and contact your physician.  You should not drive, make important decisions, or operate machinery when taking narcotic pain medication.  SUNBLOCK Use sun block to incision area over the next year if this area will be exposed to sun. This helps decrease scarring and will allow you avoid a permanent darkened area over your incision.  QUESTIONS:  Please feel free to call our office if you have any questions, and we will be glad to assist you.

## 2020-06-02 NOTE — Progress Notes (Signed)
[facility] POST-OP OFFICE VISIT  06/02/2020  HPI: [name] Browning is a 80 y.o. male 26 days s/p robotic assisted laparoscopic umbilical hernia repair with Dr Hampton Abbot  He is overall doing well Initially needed narcotic pain control but has since only requiring ibuprofen No fever, chills, nausea or emesis. Had some constipation at first but this resolved with stool softeners No issues with incisions Tolerating PO  Vital signs: BP 130/79   Pulse 78   Temp 97.8 F (36.6 C) (Oral)   Ht 6\' 2"  (1.88 m)   Wt 184 lb (83.5 kg)   SpO2 97%   BMI 23.62 kg/m    Physical Exam: Constitutional: Well appearing male, NAD Abdomen: Protuberant (baseline), non-tender, non-distended, no rebound/guarding Skin: Laparoscopic incisions are CDI with dermabond, no erythema or drainage  Assessment/Plan: This is a 80 y.o. male 26 days s/p robotic assisted laparoscopic umbilical hernia repair   - Pain control prn; OTC medications +/- ice  - Reviewed lifting restrictions; nothing more than 15-20 lbs x6 weeks total  - Reviewed wound care  - He will follow up on as needed basis   -- Edison Simon, PA-C  [facility] 06/02/2020, 10:17 AM [phone] M-F: 7am - 4pm

## 2020-06-03 DIAGNOSIS — Z6823 Body mass index (BMI) 23.0-23.9, adult: Secondary | ICD-10-CM | POA: Diagnosis not present

## 2020-06-03 DIAGNOSIS — K429 Umbilical hernia without obstruction or gangrene: Secondary | ICD-10-CM | POA: Diagnosis not present

## 2020-06-03 DIAGNOSIS — H6123 Impacted cerumen, bilateral: Secondary | ICD-10-CM | POA: Diagnosis not present

## 2020-06-10 DIAGNOSIS — H6123 Impacted cerumen, bilateral: Secondary | ICD-10-CM | POA: Diagnosis not present

## 2020-06-10 DIAGNOSIS — E785 Hyperlipidemia, unspecified: Secondary | ICD-10-CM | POA: Diagnosis not present

## 2020-06-10 DIAGNOSIS — J449 Chronic obstructive pulmonary disease, unspecified: Secondary | ICD-10-CM | POA: Diagnosis not present

## 2020-06-10 DIAGNOSIS — I10 Essential (primary) hypertension: Secondary | ICD-10-CM | POA: Diagnosis not present

## 2020-09-18 ENCOUNTER — Emergency Department: Payer: Medicare HMO

## 2020-09-18 ENCOUNTER — Other Ambulatory Visit: Payer: Self-pay

## 2020-09-18 ENCOUNTER — Inpatient Hospital Stay
Admission: EM | Admit: 2020-09-18 | Discharge: 2020-09-28 | DRG: 987 | Disposition: A | Discharge status: Home Health | Payer: Medicare HMO | Attending: Internal Medicine | Admitting: Internal Medicine

## 2020-09-18 DIAGNOSIS — J9600 Acute respiratory failure, unspecified whether with hypoxia or hypercapnia: Secondary | ICD-10-CM

## 2020-09-18 DIAGNOSIS — J9601 Acute respiratory failure with hypoxia: Secondary | ICD-10-CM | POA: Diagnosis not present

## 2020-09-18 DIAGNOSIS — Z86718 Personal history of other venous thrombosis and embolism: Secondary | ICD-10-CM

## 2020-09-18 DIAGNOSIS — Z87891 Personal history of nicotine dependence: Secondary | ICD-10-CM

## 2020-09-18 DIAGNOSIS — E440 Moderate protein-calorie malnutrition: Secondary | ICD-10-CM | POA: Insufficient documentation

## 2020-09-18 DIAGNOSIS — N179 Acute kidney failure, unspecified: Secondary | ICD-10-CM | POA: Diagnosis not present

## 2020-09-18 DIAGNOSIS — N400 Enlarged prostate without lower urinary tract symptoms: Secondary | ICD-10-CM | POA: Diagnosis present

## 2020-09-18 DIAGNOSIS — N132 Hydronephrosis with renal and ureteral calculous obstruction: Secondary | ICD-10-CM | POA: Diagnosis present

## 2020-09-18 DIAGNOSIS — N138 Other obstructive and reflux uropathy: Secondary | ICD-10-CM | POA: Diagnosis not present

## 2020-09-18 DIAGNOSIS — R9431 Abnormal electrocardiogram [ECG] [EKG]: Secondary | ICD-10-CM | POA: Diagnosis not present

## 2020-09-18 DIAGNOSIS — Z85828 Personal history of other malignant neoplasm of skin: Secondary | ICD-10-CM | POA: Diagnosis not present

## 2020-09-18 DIAGNOSIS — J90 Pleural effusion, not elsewhere classified: Secondary | ICD-10-CM | POA: Diagnosis not present

## 2020-09-18 DIAGNOSIS — E785 Hyperlipidemia, unspecified: Secondary | ICD-10-CM

## 2020-09-18 DIAGNOSIS — R109 Unspecified abdominal pain: Secondary | ICD-10-CM

## 2020-09-18 DIAGNOSIS — J189 Pneumonia, unspecified organism: Secondary | ICD-10-CM | POA: Diagnosis not present

## 2020-09-18 DIAGNOSIS — E890 Postprocedural hypothyroidism: Secondary | ICD-10-CM | POA: Diagnosis present

## 2020-09-18 DIAGNOSIS — Z6821 Body mass index (BMI) 21.0-21.9, adult: Secondary | ICD-10-CM

## 2020-09-18 DIAGNOSIS — R042 Hemoptysis: Secondary | ICD-10-CM | POA: Diagnosis not present

## 2020-09-18 DIAGNOSIS — I10 Essential (primary) hypertension: Secondary | ICD-10-CM

## 2020-09-18 DIAGNOSIS — K5900 Constipation, unspecified: Secondary | ICD-10-CM | POA: Diagnosis present

## 2020-09-18 DIAGNOSIS — I480 Paroxysmal atrial fibrillation: Secondary | ICD-10-CM | POA: Diagnosis not present

## 2020-09-18 DIAGNOSIS — I959 Hypotension, unspecified: Secondary | ICD-10-CM | POA: Diagnosis present

## 2020-09-18 DIAGNOSIS — R112 Nausea with vomiting, unspecified: Secondary | ICD-10-CM | POA: Diagnosis not present

## 2020-09-18 DIAGNOSIS — Z7982 Long term (current) use of aspirin: Secondary | ICD-10-CM | POA: Diagnosis not present

## 2020-09-18 DIAGNOSIS — R1084 Generalized abdominal pain: Secondary | ICD-10-CM | POA: Diagnosis not present

## 2020-09-18 DIAGNOSIS — K219 Gastro-esophageal reflux disease without esophagitis: Secondary | ICD-10-CM | POA: Diagnosis present

## 2020-09-18 DIAGNOSIS — J432 Centrilobular emphysema: Secondary | ICD-10-CM | POA: Diagnosis not present

## 2020-09-18 DIAGNOSIS — J441 Chronic obstructive pulmonary disease with (acute) exacerbation: Secondary | ICD-10-CM | POA: Diagnosis not present

## 2020-09-18 DIAGNOSIS — M4856XA Collapsed vertebra, not elsewhere classified, lumbar region, initial encounter for fracture: Secondary | ICD-10-CM | POA: Diagnosis not present

## 2020-09-18 DIAGNOSIS — I2510 Atherosclerotic heart disease of native coronary artery without angina pectoris: Secondary | ICD-10-CM | POA: Diagnosis present

## 2020-09-18 DIAGNOSIS — Z23 Encounter for immunization: Secondary | ICD-10-CM | POA: Diagnosis present

## 2020-09-18 DIAGNOSIS — Z8249 Family history of ischemic heart disease and other diseases of the circulatory system: Secondary | ICD-10-CM

## 2020-09-18 DIAGNOSIS — J810 Acute pulmonary edema: Secondary | ICD-10-CM

## 2020-09-18 DIAGNOSIS — F411 Generalized anxiety disorder: Secondary | ICD-10-CM | POA: Diagnosis present

## 2020-09-18 DIAGNOSIS — E860 Dehydration: Secondary | ICD-10-CM | POA: Diagnosis present

## 2020-09-18 DIAGNOSIS — K4020 Bilateral inguinal hernia, without obstruction or gangrene, not specified as recurrent: Secondary | ICD-10-CM | POA: Diagnosis not present

## 2020-09-18 DIAGNOSIS — N200 Calculus of kidney: Secondary | ICD-10-CM | POA: Diagnosis not present

## 2020-09-18 DIAGNOSIS — Z8673 Personal history of transient ischemic attack (TIA), and cerebral infarction without residual deficits: Secondary | ICD-10-CM | POA: Diagnosis not present

## 2020-09-18 DIAGNOSIS — J811 Chronic pulmonary edema: Secondary | ICD-10-CM | POA: Diagnosis not present

## 2020-09-18 DIAGNOSIS — Z8616 Personal history of COVID-19: Secondary | ICD-10-CM

## 2020-09-18 DIAGNOSIS — R10817 Generalized abdominal tenderness: Secondary | ICD-10-CM | POA: Diagnosis not present

## 2020-09-18 DIAGNOSIS — I129 Hypertensive chronic kidney disease with stage 1 through stage 4 chronic kidney disease, or unspecified chronic kidney disease: Secondary | ICD-10-CM | POA: Diagnosis present

## 2020-09-18 DIAGNOSIS — J9690 Respiratory failure, unspecified, unspecified whether with hypoxia or hypercapnia: Secondary | ICD-10-CM | POA: Diagnosis not present

## 2020-09-18 DIAGNOSIS — J9691 Respiratory failure, unspecified with hypoxia: Secondary | ICD-10-CM

## 2020-09-18 DIAGNOSIS — Z7951 Long term (current) use of inhaled steroids: Secondary | ICD-10-CM

## 2020-09-18 DIAGNOSIS — R059 Cough, unspecified: Secondary | ICD-10-CM | POA: Diagnosis not present

## 2020-09-18 DIAGNOSIS — N201 Calculus of ureter: Secondary | ICD-10-CM | POA: Diagnosis not present

## 2020-09-18 DIAGNOSIS — Z79899 Other long term (current) drug therapy: Secondary | ICD-10-CM | POA: Diagnosis not present

## 2020-09-18 DIAGNOSIS — K5730 Diverticulosis of large intestine without perforation or abscess without bleeding: Secondary | ICD-10-CM | POA: Diagnosis not present

## 2020-09-18 DIAGNOSIS — N182 Chronic kidney disease, stage 2 (mild): Secondary | ICD-10-CM | POA: Diagnosis present

## 2020-09-18 DIAGNOSIS — Z7901 Long term (current) use of anticoagulants: Secondary | ICD-10-CM | POA: Diagnosis not present

## 2020-09-18 DIAGNOSIS — R0602 Shortness of breath: Secondary | ICD-10-CM | POA: Diagnosis present

## 2020-09-18 DIAGNOSIS — J449 Chronic obstructive pulmonary disease, unspecified: Secondary | ICD-10-CM | POA: Diagnosis not present

## 2020-09-18 DIAGNOSIS — N1330 Unspecified hydronephrosis: Secondary | ICD-10-CM | POA: Diagnosis not present

## 2020-09-18 LAB — CBC
HCT: 38.8 % — ABNORMAL LOW (ref 39.0–52.0)
Hemoglobin: 13 g/dL (ref 13.0–17.0)
MCH: 31.2 pg (ref 26.0–34.0)
MCHC: 33.5 g/dL (ref 30.0–36.0)
MCV: 93 fL (ref 80.0–100.0)
Platelets: 179 10*3/uL (ref 150–400)
RBC: 4.17 MIL/uL — ABNORMAL LOW (ref 4.22–5.81)
RDW: 12.9 % (ref 11.5–15.5)
WBC: 7.7 10*3/uL (ref 4.0–10.5)
nRBC: 0 % (ref 0.0–0.2)

## 2020-09-18 LAB — COMPREHENSIVE METABOLIC PANEL
ALT: 21 U/L (ref 0–44)
AST: 26 U/L (ref 15–41)
Albumin: 3.8 g/dL (ref 3.5–5.0)
Alkaline Phosphatase: 143 U/L — ABNORMAL HIGH (ref 38–126)
Anion gap: 8 (ref 5–15)
BUN: 19 mg/dL (ref 8–23)
CO2: 23 mmol/L (ref 22–32)
Calcium: 9.6 mg/dL (ref 8.9–10.3)
Chloride: 107 mmol/L (ref 98–111)
Creatinine, Ser: 1.09 mg/dL (ref 0.61–1.24)
GFR, Estimated: >60 mL/min (ref >60)
Glucose, Bld: 118 mg/dL — ABNORMAL HIGH (ref 70–99)
Potassium: 3.9 mmol/L (ref 3.5–5.1)
Sodium: 138 mmol/L (ref 135–145)
Total Bilirubin: 1.2 mg/dL (ref 0.3–1.2)
Total Protein: 7.3 g/dL (ref 6.5–8.1)

## 2020-09-18 LAB — URINALYSIS, COMPLETE (UACMP) WITH MICROSCOPIC
Bacteria, UA: NONE SEEN
Bilirubin Urine: NEGATIVE
Glucose, UA: NEGATIVE mg/dL
Ketones, ur: NEGATIVE mg/dL
Leukocytes,Ua: NEGATIVE
Nitrite: NEGATIVE
Protein, ur: 30 mg/dL — AB
RBC / HPF: >50 RBC/hpf (ref 0–5)
Specific Gravity, Urine: 1.01 (ref 1.005–1.030)
pH: 5 (ref 5.0–8.0)

## 2020-09-18 LAB — RESP PANEL BY RT-PCR (FLU A&B, COVID) ARPGX2
Influenza A by PCR: NEGATIVE
Influenza B by PCR: NEGATIVE
SARS Coronavirus 2 by RT PCR: NEGATIVE

## 2020-09-18 LAB — LIPASE, BLOOD: Lipase: 25 U/L (ref 11–51)

## 2020-09-18 MED ORDER — ALBUTEROL SULFATE (2.5 MG/3ML) 0.083% IN NEBU
5.0000 mg | INHALATION_SOLUTION | Freq: Once | RESPIRATORY_TRACT | Status: AC
  Administered 2020-09-18: 5 mg via RESPIRATORY_TRACT
  Filled 2020-09-18: qty 6

## 2020-09-18 MED ORDER — KETOROLAC TROMETHAMINE 30 MG/ML IJ SOLN
7.5000 mg | INTRAMUSCULAR | Status: AC
  Administered 2020-09-18: 7.5 mg via INTRAVENOUS
  Filled 2020-09-18: qty 1

## 2020-09-18 MED ORDER — DOXYCYCLINE HYCLATE 100 MG PO TABS
100.0000 mg | ORAL_TABLET | Freq: Two times a day (BID) | ORAL | Status: DC
  Administered 2020-09-18 – 2020-09-20 (×4): 100 mg via ORAL
  Filled 2020-09-18 (×4): qty 1

## 2020-09-18 MED ORDER — IOHEXOL 9 MG/ML PO SOLN
1000.0000 mL | Freq: Once | ORAL | Status: AC | PRN
  Administered 2020-09-18: 1000 mL via ORAL

## 2020-09-18 MED ORDER — AMLODIPINE BESYLATE 10 MG PO TABS
10.0000 mg | ORAL_TABLET | Freq: Every day | ORAL | Status: DC
  Administered 2020-09-18 – 2020-09-19 (×2): 10 mg via ORAL
  Filled 2020-09-18 (×2): qty 1

## 2020-09-18 MED ORDER — ONDANSETRON HCL 4 MG/2ML IJ SOLN
4.0000 mg | Freq: Once | INTRAMUSCULAR | Status: AC
  Administered 2020-09-18: 4 mg via INTRAVENOUS
  Filled 2020-09-18: qty 2

## 2020-09-18 MED ORDER — ONDANSETRON HCL 4 MG/2ML IJ SOLN
4.0000 mg | Freq: Four times a day (QID) | INTRAMUSCULAR | Status: DC | PRN
  Administered 2020-09-20 (×3): 4 mg via INTRAVENOUS
  Filled 2020-09-18 (×3): qty 2

## 2020-09-18 MED ORDER — INFLUENZA VAC A&B SA ADJ QUAD 0.5 ML IM PRSY
0.5000 mL | PREFILLED_SYRINGE | INTRAMUSCULAR | Status: AC
  Administered 2020-09-19: 0.5 mL via INTRAMUSCULAR
  Filled 2020-09-18: qty 0.5

## 2020-09-18 MED ORDER — LOSARTAN POTASSIUM 50 MG PO TABS
100.0000 mg | ORAL_TABLET | Freq: Every day | ORAL | Status: DC
  Administered 2020-09-18: 100 mg via ORAL
  Filled 2020-09-18: qty 2

## 2020-09-18 MED ORDER — ASPIRIN EC 81 MG PO TBEC
81.0000 mg | DELAYED_RELEASE_TABLET | Freq: Every day | ORAL | Status: DC
  Administered 2020-09-18 – 2020-09-27 (×9): 81 mg via ORAL
  Filled 2020-09-18 (×9): qty 1

## 2020-09-18 MED ORDER — IOHEXOL 350 MG/ML SOLN
100.0000 mL | Freq: Once | INTRAVENOUS | Status: AC | PRN
  Administered 2020-09-18: 100 mL via INTRAVENOUS

## 2020-09-18 MED ORDER — APIXABAN 5 MG PO TABS
5.0000 mg | ORAL_TABLET | Freq: Two times a day (BID) | ORAL | Status: DC
  Administered 2020-09-18 – 2020-09-19 (×2): 5 mg via ORAL
  Filled 2020-09-18 (×2): qty 1

## 2020-09-18 MED ORDER — MORPHINE SULFATE (PF) 2 MG/ML IV SOLN: 2.0000 mg | INTRAVENOUS | Status: DC | PRN

## 2020-09-18 MED ORDER — ACETAMINOPHEN 500 MG PO TABS
1000.0000 mg | ORAL_TABLET | Freq: Once | ORAL | Status: AC
  Administered 2020-09-18: 1000 mg via ORAL
  Filled 2020-09-18: qty 2

## 2020-09-18 MED ORDER — HYDROCODONE-ACETAMINOPHEN 5-325 MG PO TABS
1.0000 | ORAL_TABLET | ORAL | Status: DC | PRN
  Administered 2020-09-19 – 2020-09-22 (×2): 1 via ORAL
  Filled 2020-09-18 (×2): qty 1

## 2020-09-18 MED ORDER — MORPHINE SULFATE (PF) 4 MG/ML IV SOLN
4.0000 mg | Freq: Once | INTRAVENOUS | Status: AC
  Administered 2020-09-18: 4 mg via INTRAVENOUS
  Filled 2020-09-18: qty 1

## 2020-09-18 MED ORDER — IPRATROPIUM-ALBUTEROL 0.5-2.5 (3) MG/3ML IN SOLN: 3.0000 mL | Freq: Four times a day (QID) | RESPIRATORY_TRACT | Status: DC | PRN

## 2020-09-18 MED ORDER — MONTELUKAST SODIUM 10 MG PO TABS
10.0000 mg | ORAL_TABLET | Freq: Every day | ORAL | Status: DC
  Administered 2020-09-18 – 2020-09-27 (×10): 10 mg via ORAL
  Filled 2020-09-18 (×10): qty 1

## 2020-09-18 MED ORDER — LACTATED RINGERS IV BOLUS
1000.0000 mL | Freq: Once | INTRAVENOUS | Status: AC
  Administered 2020-09-18: 1000 mL via INTRAVENOUS

## 2020-09-18 MED ORDER — IPRATROPIUM-ALBUTEROL 0.5-2.5 (3) MG/3ML IN SOLN
3.0000 mL | Freq: Once | RESPIRATORY_TRACT | Status: AC
  Administered 2020-09-18: 3 mL via RESPIRATORY_TRACT
  Filled 2020-09-18: qty 3

## 2020-09-18 MED ORDER — METHYLPREDNISOLONE SODIUM SUCC 40 MG IJ SOLR
40.0000 mg | Freq: Every day | INTRAMUSCULAR | Status: DC
  Administered 2020-09-19 – 2020-09-25 (×7): 40 mg via INTRAVENOUS
  Filled 2020-09-18 (×7): qty 1

## 2020-09-18 MED ORDER — FERROUS GLUCONATE 324 (38 FE) MG PO TABS
324.0000 mg | ORAL_TABLET | Freq: Every day | ORAL | Status: DC
  Administered 2020-09-19 – 2020-09-27 (×8): 324 mg via ORAL
  Filled 2020-09-18 (×11): qty 1

## 2020-09-18 MED ORDER — METHYLPREDNISOLONE SODIUM SUCC 125 MG IJ SOLR
125.0000 mg | Freq: Once | INTRAMUSCULAR | Status: AC
  Administered 2020-09-18: 125 mg via INTRAVENOUS
  Filled 2020-09-18: qty 2

## 2020-09-18 MED ORDER — PANTOPRAZOLE SODIUM 40 MG PO TBEC
40.0000 mg | DELAYED_RELEASE_TABLET | Freq: Every day | ORAL | Status: DC
  Administered 2020-09-18 – 2020-09-27 (×9): 40 mg via ORAL
  Filled 2020-09-18 (×9): qty 1

## 2020-09-18 MED ORDER — ATORVASTATIN CALCIUM 20 MG PO TABS
40.0000 mg | ORAL_TABLET | Freq: Every day | ORAL | Status: DC
  Administered 2020-09-18 – 2020-09-27 (×9): 40 mg via ORAL
  Filled 2020-09-18 (×9): qty 2

## 2020-09-18 NOTE — ED Notes (Signed)
Pt presents to ED with c/o of lower ABD pain that is also causing pain to his R lateral side. Pt denies HX of kidney stones. Pt denies urinary symptoms. Pt states some N/V with 1 episode of vomiting.   Pt states last BM was yesterday and pt also states he is passing flatus, pt denies diarrhea. Pt is A&Ox4. Pt does appear and reports he is more distended in his ABD more than normal. NAD noted at this time.

## 2020-09-18 NOTE — ED Notes (Signed)
Pt at CT

## 2020-09-18 NOTE — ED Notes (Signed)
Vitals checked and pt was found to be hypoxic with a good pleth in the 70's , pt states slight SOB, pt placed on 4L/min via [place] with success and improvement of O2 sat at 94%, pt states HX of COPD, MD aware.

## 2020-09-18 NOTE — ED Triage Notes (Signed)
Pt comes with c/o right sided belly pain that radiates to back the patient states this started last night. Pt denies any N/V.  Pt states painful when walking as well.

## 2020-09-18 NOTE — ED Notes (Signed)
Patient provided a dinner tray.

## 2020-09-18 NOTE — ED Provider Notes (Signed)
[facility] Emergency Department Provider Note ____________________________________________   Event Date/Time   First MD Initiated Contact with Patient 09/18/20 [id number]     (approximate)  I have reviewed the triage vital signs and the nursing notes.  HISTORY  Chief Complaint Abdominal Pain   HPI [name] Browning is a 80 y.o. malewho presents to the ED for evaluation of left-sided abdominal pain.  Chart review indicates A. fib on Eliquis, umbilical hernia repair a couple months ago.  COPD, HTN..  Patient presents to the ED for evaluation of about 12 hours of left-sided abdominal pain.  He reports intermittent pain that began worsening yesterday evening, make it difficult for him to sleep last night.  He reports multiple episodes of emesis, the most recent one had some pink tinge to it, but no frank hematemesis or hemoptysis.  Reports shortness of breath "only when leaning forward."  Denies increased sputum production or cough from his baseline.  Denies fevers, chest pain, night sweats, unintentional weight loss.  Past Medical History:  Diagnosis Date   Anxiety    Aortic atherosclerosis (HCC)    Atrial fibrillation ([place]) 01/2019   Bilateral carpal tunnel syndrome    CAD (coronary artery disease)    Carpal tunnel syndrome, bilateral    Chronic anticoagulation    COPD (chronic obstructive pulmonary disease) ([place])    COVID-19 virus infection 02/08/2019   Degenerative disc disease, cervical    DVT (deep venous thrombosis) (HCC)    GERD (gastroesophageal reflux disease)    HLD (hyperlipidemia)    Hypertension    Kidney stones    Leaky heart valve    Normal coronary arteries    a. by cardiac cath in 2004; b. normal stress test in 2007 and 2011; c. Lexiscan 05/20/2014: no significant ischemia, EF 55-65%, no EKG changes, low risk study    Osteoarthritis of right shoulder    Pneumonia    Skin cancer    skin cancer on right ear and right forehead removed    Syncope    TIA (transient ischemic attack) 2016    Patient Active Problem List   Diagnosis Date Noted   Umbilical hernia without obstruction and without gangrene    Pneumonia 03/25/2019   History of 2019 novel coronavirus disease (COVID-19) 03/06/2019   COVID-19 virus infection 02/08/2019   New onset atrial fibrillation ([place]) 02/08/2019   COPD, moderate ([place]) 09/14/2018   Degenerative tear of glenoid labrum of right shoulder 08/24/2017   Injury of tendon of long head of right biceps 08/21/2017   Rotator cuff tendinitis, right 08/21/2017   Traumatic complete tear of right rotator cuff 08/21/2017   Chronic neck pain 03/09/2017   Degenerative disc disease, cervical 03/09/2017   Perennial allergic rhinitis 03/09/2017   Hyperglycemia 09/01/2015   Elevated rheumatoid factor 04/08/2015   Arthritis of both hands 04/01/2015   Bilateral hand pain 03/18/2015   Elevated alkaline phosphatase level 03/02/2015   Anxiety 02/16/2015   Intermittent chest pain 02/16/2015   History of TIA (transient ischemic attack) 05/18/2014   HTN (hypertension) 05/18/2014   Hyperlipidemia 05/18/2014   Status post carpal tunnel release 06/26/2013    Past Surgical History:  Procedure Laterality Date   CARDIAC CATHETERIZATION  2004   CARPAL TUNNEL RELEASE Right 06/11/2013   SHOULDER ARTHROSCOPY WITH OPEN ROTATOR CUFF REPAIR Right 08/24/2017   Procedure: SHOULDER ARTHROSCOPY WITH OPEN ROTATOR CUFF REPAIR;  Surgeon: Corky Mull, MD;  Location: [facility] ORS;  Service: Orthopedics;  Laterality: Right;   SKIN  CANCER EXCISION  12/01/2016   right ear    SKIN CANCER EXCISION     remove from the right side of the face    THROMBECTOMY Right 2004   leg   THYROIDECTOMY  1950   Not sure if total or partial thyroidectomy.    Prior to Admission medications   Medication Sig Start Date End Date Taking? Authorizing Provider  acetaminophen (TYLENOL) 500 MG tablet Take 2 tablets (1,000 mg total) by mouth every 6 (six) hours  as needed for mild pain. 05/07/20   Olean Ree, MD  amLODipine (NORVASC) 10 MG tablet Take 10 mg by mouth daily after supper. 03/28/20   [provider]  apixaban (ELIQUIS) 5 MG TABS tablet Take 1 tablet (5 mg total) by mouth 2 (two) times daily. Patient not taking: Reported on 04/23/2020 02/11/19 03/25/19  Enzo Bi, MD  aspirin EC 81 MG tablet Take 81 mg by mouth daily after supper.    [provider]  atorvastatin (LIPITOR) 40 MG tablet Take 40 mg by mouth daily after supper.    [provider]  cetirizine (ZYRTEC) 10 MG tablet Take 10 mg by mouth daily after supper. 10/10/18   [provider]  Cholecalciferol 125 MCG (5000 UT) capsule Take 5,000 Units by mouth daily after supper.    [provider]  Ferrous Gluconate (IRON) 240 (27 Fe) MG TABS Take 27 mg by mouth daily after supper. 09/21/18   [provider]  ibuprofen (ADVIL) 600 MG tablet Take 1 tablet (600 mg total) by mouth every 8 (eight) hours as needed for moderate pain. 05/07/20   Olean Ree, MD  losartan (COZAAR) 100 MG tablet Take 100 mg by mouth daily after supper.    [provider]  montelukast (SINGULAIR) 10 MG tablet Take 10 mg by mouth at bedtime.    [provider]  pantoprazole (PROTONIX) 40 MG tablet Take 40 mg by mouth daily after supper. 12/10/18   [provider]  SYMBICORT 160-4.5 MCG/ACT inhaler Inhale 2 puffs into the lungs daily. 12/07/18   [provider]    Allergies Hydromorphone  Family History  Problem Relation Age of Onset   Hypertension Mother    Heart disease Mother    CAD Father    Heart attack Father     Social History Social History   Tobacco Use   Smoking status: Former    Packs/day: 1.00    Years: 46.00    Pack years: 46.00    Types: Cigarettes    Start date: 01/10/1957    Quit date: 02/11/2003    Years since quitting: 17.6   Smokeless tobacco: Former    Types: Snuff    Quit date: 02/11/2003  Vaping  Use   Vaping Use: Never used  Substance Use Topics   Alcohol use: Not Currently    Alcohol/week: 7.0 standard drinks    Types: 7 Cans of beer per week   Drug use: No    Review of Systems  Constitutional: No fever/chills Eyes: No visual changes. ENT: No sore throat. Cardiovascular: Denies chest pain. Respiratory: Denies shortness of breath. Gastrointestinal: Positive for abdominal pain, nausea and vomiting.  No diarrhea.  No constipation. Genitourinary: Negative for dysuria. Musculoskeletal: Negative for back pain. Skin: Negative for rash. Neurological: Negative for headaches, focal weakness or numbness.  ____________________________________________   PHYSICAL EXAM:  VITAL SIGNS: Vitals:   09/18/20 1223 09/18/20 1500  BP: 121/62 126/65  Pulse: (!) 108 89  Resp: (!) 21 (!)  21  Temp:    SpO2: 95% 94%     Constitutional: Alert and oriented.  Appears uncomfortable but in no acute distress. Eyes: Conjunctivae are normal. PERRL. EOMI. Head: Atraumatic. Nose: No congestion/rhinnorhea. Mouth/Throat: Mucous membranes are moist.  Oropharynx non-erythematous. Neck: No stridor. No cervical spine tenderness to palpation. Cardiovascular: Normal rate, regular rhythm. Grossly normal heart sounds.  Good peripheral circulation. Respiratory: Minimal tachypnea to the 20s, no further evidence of distress.  Prolonged expiratory phase and slight decrease in air movement throughout.  Diffuse and scattered expiratory wheezes. Gastrointestinal: Soft , . No CVA tenderness. Mild distention, which patient reports is new, as well as diffuse tenderness, primarily to left-sided abdomen with voluntary guarding. Musculoskeletal: No lower extremity tenderness.  No joint effusions. No signs of acute trauma. Neurologic:  Normal speech and language. No gross focal neurologic deficits are appreciated.  Skin:  Skin is warm, dry and intact. No rash noted. Psychiatric: Mood and affect are normal. Speech and  behavior are normal. ____________________________________________   LABS (all labs ordered are listed, but only abnormal results are displayed)  Labs Reviewed  COMPREHENSIVE METABOLIC PANEL - Abnormal; Notable for the following components:      Result Value   Glucose, Bld 118 (*)    Alkaline Phosphatase 143 (*)    All other components within normal limits  CBC - Abnormal; Notable for the following components:   RBC 4.17 (*)    HCT 38.8 (*)    All other components within normal limits  RESP PANEL BY RT-PCR (FLU A&B, COVID) ARPGX2  LIPASE, BLOOD  URINALYSIS, COMPLETE (UACMP) WITH MICROSCOPIC   ____________________________________________  12 Lead EKG   ____________________________________________  RADIOLOGY  ED MD interpretation: 2 view CXR reviewed by me with multifocal peripheral opacities without PTX  Official radiology report(s): DG Chest 2 View  Result Date: 09/18/2020 CLINICAL DATA:  Shortness of breath.  Evaluate for infiltrate. EXAM: CHEST - 2 VIEW COMPARISON:  03/25/2019 and 02/08/2019 a FINDINGS: Diffuse coarse lung markings are suggestive for chronic changes. There is evidence for pleural fluid in the fissures, probably the right major fissure. Scarring at the lung apices. Heart and mediastinum are within normal limits and stable. Increased densities along the periphery of the mid chest bilaterally. IMPRESSION: Diffuse parenchymal lung densities are suggestive for chronic changes. Slightly increased densities along the peripheral aspect of both lungs that could represent acute or chronic disease versus pleural disease. There is probably a small amount of loculated fluid along the fissures. Findings may be better characterized with CT. Electronically Signed   By: Markus Daft M.D.   On: 09/18/2020 11:11    ____________________________________________   PROCEDURES and INTERVENTIONS  Procedure(s) performed (including Critical Care):  .1-3 Lead EKG  Interpretation Performed by: Vladimir Crofts, MD Authorized by: Vladimir Crofts, MD     Interpretation: normal     ECG rate:  90   ECG rate assessment: normal     Rhythm: sinus rhythm     Ectopy: none     Conduction: normal    Medications  lactated ringers bolus 1,000 mL (0 mLs Intravenous Stopped 09/18/20 1217)  ondansetron (ZOFRAN) injection 4 mg (4 mg Intravenous Given 09/18/20 1026)  morphine 4 MG/ML injection 4 mg (4 mg Intravenous Given 09/18/20 1026)  ipratropium-albuterol (DUONEB) 0.5-2.5 (3) MG/3ML nebulizer solution 3 mL (3 mLs Nebulization Given 09/18/20 1207)  methylPREDNISolone sodium succinate (SOLU-MEDROL) 125 mg/2 mL injection 125 mg (125 mg Intravenous Given 09/18/20 1208)  iohexol (OMNIPAQUE) 9 MG/ML oral solution 1,000  mL (1,000 mLs Oral Contrast Given 09/18/20 1255)  iohexol (OMNIPAQUE) 350 MG/ML injection 100 mL (100 mLs Intravenous Contrast Given 09/18/20 1425)    ____________________________________________   MDM / ED COURSE   Pleasant 80 year old male with history of COPD presents to the ED with abdominal symptoms concerning for SBO, becoming hypoxic while waiting for CT imaging.  Initial presentation is most concerning for SBO considering his abdominal distention, emesis and pain.  His CXR, despite his lack of symptoms, does demonstrate various peripheral opacities.  Does have stigmata of COPD exacerbation considering his wheezing and hypoxia while in the ED, improving with breathing treatments and steroids.  Awaiting CT imaging of his chest, abdomen and pelvis to assess for intra-abdominal pathology such as SBO to cause his symptoms, as well as further elucidate these peripheral opacities on his CXR.  Patient signed out to oncoming provider.  Anticipate admission due to his associated hypoxia.   Clinical Course as of 09/18/20 1516  Fri Sep 18, 2020  1156 Reassessed morphine.  Dropped her sats briefly but after getting this medication.  Require nasal cannula.  I reassessed his  lungs and order breathing treatments and steroids.  Awaiting CT. [DS]  [id number] Reassessed.  Slightly improved aeration after breathing treatment.  Still hypoxic.  Awaiting CT. [DS]    Clinical Course User Index [DS] Vladimir Crofts, MD    ____________________________________________   FINAL CLINICAL IMPRESSION(S) / ED DIAGNOSES  Final diagnoses:  Generalized abdominal pain  Non-intractable vomiting with nausea, unspecified vomiting type  COPD exacerbation [facility])     ED Discharge Orders     None        Lonna Rabold   Note:  This document was prepared using Dragon voice recognition software and may include unintentional dictation errors.    Vladimir Crofts, MD 09/18/20 [phone]

## 2020-09-18 NOTE — H&P (Signed)
History and Physical    [name] Browning [id number] DOB: 17-Jan-1940 DOA: 09/18/2020  PCP: [place], [place]  Patient coming from: Home, lives with wife and daughter.  Daughter is at bedside today.  I have personally briefly reviewed patient's old medical records in [place]  Chief Complaint: Increasing shortness of breath and left-sided abdominal pain  HPI: [name] Browning is a 80 y.o. male with medical history significant for COPD, paroxysmal atrial fibrillation on Eliquis, TIA, hyperlipidemia, anxiety and recent umbilical hernia repair who presents with concerns of increasing shortness of breath and left-sided abdominal pain.  For the past 2 weeks he has noticed increasing shortness of breath with exertion.  Then last week began to have a cough with mild sputum production.  Also began to note intermittent left-sided abdominal pain radiating to his left flank.  Had nausea and vomiting yesterday.  Notes vomitus was blood-tinged.  Denies any diarrhea.  Denies any fever.  Denies dysuria but has noted some dribbling the past 3 months.  No sick contact. Remote hx of tobacco use.  Patient reports taking Eliquis with last dose last evening.  ED Course: He was afebrile normotensive and had oxygen desaturation down to 70% and had to be placed on 4 L via nasal cannula.  No leukocytosis.  Hemoglobin of 13.  Creatinine normal at 1.09.  BG of 118.  CT of the abdomen revealed an 8 x 8 mm obstructing stone to the left proximal ureter with left-sided hydronephrosis.  UA was negative. Small bilateral pleural effusion seen on lung bases.  Patient was evaluated by urology Dr. Abner Greenspan and pt has opted for outpatient ESWL.  He was given DuoNeb, IV 125 mg Solu-Medrol for COPD exacerbation.  Review of Systems: Constitutional: No Weight Change, No Fever ENT/Mouth: No sore throat, No Rhinorrhea Eyes: No Eye Pain, No Vision Changes Cardiovascular: +Chest Pain, + SOB, No PND, + Dyspnea on Exertion, No  Orthopnea, No Edema, No Palpitations Respiratory: + Cough, +Sputum, No Wheezing, no Dyspnea  Gastrointestinal: + Nausea, No Vomiting, No Diarrhea + Pain Genitourinary: no Urinary Incontinence, No Urgency, +Flank Pain Musculoskeletal: No Arthralgias, No Myalgias Skin: No Skin Lesions, No Pruritus, Neuro: no Weakness, No Numbness Psych: No Anxiety/Panic, No Depression, +decrease appetite Heme/Lymph: No Bruising, No Bleeding  Past Medical History:  Diagnosis Date   Anxiety    Aortic atherosclerosis (HCC)    Atrial fibrillation ([place]) 01/2019   Bilateral carpal tunnel syndrome    CAD (coronary artery disease)    Carpal tunnel syndrome, bilateral    Chronic anticoagulation    COPD (chronic obstructive pulmonary disease) (HCC)    COVID-19 virus infection 02/08/2019   Degenerative disc disease, cervical    DVT (deep venous thrombosis) (HCC)    GERD (gastroesophageal reflux disease)    HLD (hyperlipidemia)    Hypertension    Kidney stones    Leaky heart valve    Normal coronary arteries    a. by cardiac cath in 2004; b. normal stress test in 2007 and 2011; c. Lexiscan 05/20/2014: no significant ischemia, EF 55-65%, no EKG changes, low risk study    Osteoarthritis of right shoulder    Pneumonia    Skin cancer    skin cancer on right ear and right forehead removed   Syncope    TIA (transient ischemic attack) 2016    Past Surgical History:  Procedure Laterality Date   CARDIAC CATHETERIZATION  2004   CARPAL TUNNEL RELEASE Right 06/11/2013   SHOULDER ARTHROSCOPY WITH OPEN  ROTATOR CUFF REPAIR Right 08/24/2017   Procedure: SHOULDER ARTHROSCOPY WITH OPEN ROTATOR CUFF REPAIR;  Surgeon: Corky Mull, MD;  Location: [facility] ORS;  Service: Orthopedics;  Laterality: Right;   SKIN CANCER EXCISION  12/01/2016   right ear    SKIN CANCER EXCISION     remove from the right side of the face    THROMBECTOMY Right 2004   leg   THYROIDECTOMY  1950   Not sure if total or partial thyroidectomy.      reports that he quit smoking about 17 years ago. His smoking use included cigarettes. He started smoking about 63 years ago. He has a 46.00 pack-year smoking history. He quit smokeless tobacco use about 17 years ago.  His smokeless tobacco use included snuff. He reports that he does not currently use alcohol after a past usage of about 7.0 standard drinks per week. He reports that he does not use drugs. Social History  Allergies  Allergen Reactions   Hydromorphone     Other reaction(s): Hallucination Pt reports he doesn't know about this    Family History  Problem Relation Age of Onset   Hypertension Mother    Heart disease Mother    CAD Father    Heart attack Father      Prior to Admission medications   Medication Sig Start Date End Date Taking? Authorizing Provider  acetaminophen (TYLENOL) 500 MG tablet Take 2 tablets (1,000 mg total) by mouth every 6 (six) hours as needed for mild pain. 05/07/20   Olean Ree, MD  amLODipine (NORVASC) 10 MG tablet Take 10 mg by mouth daily after supper. 03/28/20   [provider]  apixaban (ELIQUIS) 5 MG TABS tablet Take 1 tablet (5 mg total) by mouth 2 (two) times daily. Patient not taking: Reported on 04/23/2020 02/11/19 03/25/19  Enzo Bi, MD  aspirin EC 81 MG tablet Take 81 mg by mouth daily after supper.    [provider]  atorvastatin (LIPITOR) 40 MG tablet Take 40 mg by mouth daily after supper.    [provider]  cetirizine (ZYRTEC) 10 MG tablet Take 10 mg by mouth daily after supper. 10/10/18   [provider]  Cholecalciferol 125 MCG (5000 UT) capsule Take 5,000 Units by mouth daily after supper.    [provider]  Ferrous Gluconate (IRON) 240 (27 Fe) MG TABS Take 27 mg by mouth daily after supper. 09/21/18   [provider]  ibuprofen (ADVIL) 600 MG tablet Take 1 tablet (600 mg total) by mouth every 8 (eight) hours as needed for moderate pain. 05/07/20   Olean Ree, MD  losartan  (COZAAR) 100 MG tablet Take 100 mg by mouth daily after supper.    [provider]  montelukast (SINGULAIR) 10 MG tablet Take 10 mg by mouth at bedtime.    [provider]  pantoprazole (PROTONIX) 40 MG tablet Take 40 mg by mouth daily after supper. 12/10/18   [provider]  SYMBICORT 160-4.5 MCG/ACT inhaler Inhale 2 puffs into the lungs daily. 12/07/18   [provider]    Physical Exam: Vitals:   09/18/20 0926 09/18/20 1223 09/18/20 1500 09/18/20 1830  BP: (!) 146/75 121/62 126/65 127/86  Pulse: 79 (!) 108 89 (!) 104  Resp: 18 (!) 21 (!) 21 19  Temp: 98 F (36.7 C)     TempSrc: Oral     SpO2: 93% 95% 94% 96%  Weight: 80.7 kg     Height: '6\' 2"'$  (1.88 m)  Constitutional: NAD, calm, comfortable, nontoxic appearing elderly male laying at approximately 20 degree incline in bed Vitals:   09/18/20 0926 09/18/20 1223 09/18/20 1500 09/18/20 1830  BP: (!) 146/75 121/62 126/65 127/86  Pulse: 79 (!) 108 89 (!) 104  Resp: 18 (!) 21 (!) 21 19  Temp: 98 F (36.7 C)     TempSrc: Oral     SpO2: 93% 95% 94% 96%  Weight: 80.7 kg     Height: '6\' 2"'$  (1.88 m)      Eyes: PERRL, lids and conjunctivae normal ENMT: Mucous membranes are moist.  Neck: normal, supple Respiratory: Faint expiratory wheezes throughout on 4 L nasal cannula. Normal respiratory effort. No accessory muscle use.  Cardiovascular: Regular rate and rhythm, no murmurs / rubs / gallops. No extremity edema.  Abdomen: Distended, left sided abdominal and flank tenderness, no masses palpated.  Bowel sounds positive.  Musculoskeletal: no clubbing / cyanosis. No joint deformity upper and lower extremities. Good ROM, no contractures. Normal muscle tone.  Skin: no rashes, lesions, ulcers. No induration Neurologic: CN 2-12 grossly intact. Sensation intact. Strength 5/5 in all 4.  Psychiatric: Normal judgment and insight. Alert and oriented x 3. Normal mood.     Labs on Admission: I have  personally reviewed following labs and imaging studies  CBC: Recent Labs  Lab 09/18/20 0929  WBC 7.7  HGB 13.0  HCT 38.8*  MCV 93.0  PLT 0000000   Basic Metabolic Panel: Recent Labs  Lab 09/18/20 0929  NA 138  K 3.9  CL 107  CO2 23  GLUCOSE 118*  BUN 19  CREATININE 1.09  CALCIUM 9.6   GFR: Estimated Creatinine Clearance: 62.7 mL/min (by C-G formula based on SCr of 1.09 mg/dL). Liver Function Tests: Recent Labs  Lab 09/18/20 0929  AST 26  ALT 21  ALKPHOS 143*  BILITOT 1.2  PROT 7.3  ALBUMIN 3.8   Recent Labs  Lab 09/18/20 0929  LIPASE 25   No results for input(s): AMMONIA in the last 168 hours. Coagulation Profile: No results for input(s): INR, PROTIME in the last 168 hours. Cardiac Enzymes: No results for input(s): CKTOTAL, CKMB, CKMBINDEX, TROPONINI in the last 168 hours. BNP (last 3 results) No results for input(s): PROBNP in the last 8760 hours. HbA1C: No results for input(s): HGBA1C in the last 72 hours. CBG: No results for input(s): GLUCAP in the last 168 hours. Lipid Profile: No results for input(s): CHOL, HDL, LDLCALC, TRIG, CHOLHDL, LDLDIRECT in the last 72 hours. Thyroid Function Tests: No results for input(s): TSH, T4TOTAL, FREET4, T3FREE, THYROIDAB in the last 72 hours. Anemia Panel: No results for input(s): VITAMINB12, FOLATE, FERRITIN, TIBC, IRON, RETICCTPCT in the last 72 hours. Urine analysis:    Component Value Date/Time   COLORURINE YELLOW 09/18/2020 1813   APPEARANCEUR CLEAR 09/18/2020 1813   APPEARANCEUR Cloudy 08/10/2013 1825   LABSPEC 1.010 09/18/2020 1813   LABSPEC 1.014 08/10/2013 1825   PHURINE 5.0 09/18/2020 1813   GLUCOSEU NEGATIVE 09/18/2020 1813   GLUCOSEU Negative 08/10/2013 1825   HGBUR LARGE (A) 09/18/2020 1813   BILIRUBINUR NEGATIVE 09/18/2020 1813   BILIRUBINUR 1+ 08/10/2013 1825   KETONESUR NEGATIVE 09/18/2020 1813   PROTEINUR 30 (A) 09/18/2020 1813   NITRITE NEGATIVE 09/18/2020 1813   LEUKOCYTESUR NEGATIVE  09/18/2020 1813   LEUKOCYTESUR Negative 08/10/2013 1825    Radiological Exams on Admission: DG Chest 2 View  Result Date: 09/18/2020 CLINICAL DATA:  Shortness of breath.  Evaluate for infiltrate. EXAM: CHEST - 2 VIEW COMPARISON:  03/25/2019 and 02/08/2019 a FINDINGS: Diffuse coarse lung markings are suggestive for chronic changes. There is evidence for pleural fluid in the fissures, probably the right major fissure. Scarring at the lung apices. Heart and mediastinum are within normal limits and stable. Increased densities along the periphery of the mid chest bilaterally. IMPRESSION: Diffuse parenchymal lung densities are suggestive for chronic changes. Slightly increased densities along the peripheral aspect of both lungs that could represent acute or chronic disease versus pleural disease. There is probably a small amount of loculated fluid along the fissures. Findings may be better characterized with CT. Electronically Signed   By: Markus Daft M.D.   On: 09/18/2020 11:11   CT CHEST ABDOMEN PELVIS W CONTRAST  Result Date: 09/18/2020 CLINICAL DATA:  recent umbilical hernia repair. eval SBO. Bloating, LLQ pain, emesis EXAM: CT CHEST, ABDOMEN, AND PELVIS WITH CONTRAST TECHNIQUE: Multidetector CT imaging of the chest, abdomen and pelvis was performed following the standard protocol during bolus administration of intravenous contrast. CONTRAST:  163m OMNIPAQUE IOHEXOL 350 MG/ML SOLN COMPARISON:  CT chest 03/25/2019 FINDINGS: CT CHEST FINDINGS Cardiovascular: The heart size is within normal limits. No pericardial effusion. Thoracic aorta is nonaneurysmal. Atherosclerotic calcifications of the aorta and coronary arteries. Central pulmonary vasculature appears within normal limits. Mediastinum/Nodes: There are a few mildly enlarged mediastinal lymph nodes including 11 mm right paratracheal node (series 2, image 12), unchanged and 11 mm precarinal node (series 2, image 17), unchanged. 14 mm subcarinal node (series  2, image 24) is also unchanged. 10 mm right hilar node is unchanged. No axillary lymphadenopathy. Thyroid and trachea are within normal limits. Small amount of fluid within the esophagus, which appears otherwise unremarkable. Lungs/Pleura: Moderate centrilobular and paraseptal emphysematous changes bilaterally. Small bilateral pleural effusions with fluid tracking into the fissures. Diffuse bronchial wall thickening. Prominent biapical pleuroparenchymal scarring is unchanged in appearance compared to the prior including a more nodular configuration within the left apex. No new focal airspace consolidation. No pneumothorax. Musculoskeletal: No chest wall mass or suspicious bone lesions identified. CT ABDOMEN PELVIS FINDINGS Hepatobiliary: No focal liver abnormality is seen. No gallstones, gallbladder wall thickening, or biliary dilatation. Pancreas: Unremarkable. No pancreatic ductal dilatation or surrounding inflammatory changes. Spleen: Normal in size without focal abnormality. Adrenals/Urinary Tract: Unremarkable adrenal glands. Obstructing 8 x 8 mm stone within the proximal left ureter near the ureteropelvic junction resulting in moderate left-sided hydronephrosis. There are numerous additional stones within the left kidney, largest within the inferior pole measures up to 7 mm. Right kidney is within normal limits. No right renal stone or hydronephrosis. Urinary bladder is unremarkable for the degree of distension. Stomach/Bowel: Stomach is within normal limits. Appendix appears normal. Extensive diverticulosis within the sigmoid and distal descending colon. No evidence of bowel wall thickening, distention, or inflammatory changes. Vascular/Lymphatic: Aortoiliac atherosclerosis without aneurysm. No abdominopelvic lymphadenopathy. Reproductive: Prostate gland is enlarged with multiple coarse calcifications. Other: No ascites. No abdominopelvic fluid collections. No pneumoperitoneum. Bilateral fat containing  inguinal hernias. Musculoskeletal: Inferior endplate compression fracture of L4 appears chronic although is age indeterminate. Lower lumbar facet arthropathy. IMPRESSION: 1. Obstructing 8 x 8 mm stone within the proximal left ureter near the ureteropelvic junction resulting in moderate left-sided hydronephrosis. 2. Small bilateral pleural effusions. 3. Unchanged mildly enlarged mediastinal and right hilar lymph nodes, likely reactive. 4. Colonic diverticulosis without evidence of acute diverticulitis. 5. Inferior endplate compression fracture of L4 appears chronic although is age indeterminate. 6. Findings of COPD with prominent biapical pleuroparenchymal scarring, similar in appearance to the  previous CT. Aortic Atherosclerosis (ICD10-I70.0) and Emphysema (ICD10-J43.9). Electronically Signed   By: Davina Poke D.O.   On: 09/18/2020 15:16      Assessment/Plan  Acute hypoxic respiratory failure secondary to COPD exacerbation - Patient admitted on 4 L.  Maintain O2 between 88%-92%  - start PO doxycycline-has had increasing dyspnea and sputum -DuoNeb q6 prn -Daily IV Solu-Medrol  Obstructing left proximal ureter stone with left-sided hydronephrosis - Patient is afebrile, UA is negative.  Creatinine is normal at 1.09. -Has been evaluated by urology and has opted for outpatient ESWL.  Urology will try to arrange for procedure to be done next week -Continue to monitor for any fever -PRN hydrocodone for moderate pain and IV morphine for severe pain  Paroxysmal atrial fibrillation - Continue Eliquis- pt endorse taking to me but had other documentation when he was noted to not be taking it  Hypertension -Continue amlodipine, losartan  Hyperlipidemia -Continue statin  DVT prophylaxis:.Lovenox Code Status: Full Family Communication: Plan discussed with patient at bedside  disposition Plan: Home with observation Consults called: urology Admission status: Observation  Level of care:  Med-Surg  Status is: Observation  The patient remains OBS appropriate and will d/c before 2 midnights.  Dispo: The patient is from: Home              Anticipated d/c is to: Home              Patient currently is not medically stable to d/c.   Difficult to place patient No         Orene Desanctis DO [facility]   If 7PM-7AM, please contact night-coverage [email or web address]   09/18/2020, 6:54 PM

## 2020-09-18 NOTE — Progress Notes (Signed)
Urology Consult   Physician requesting consult: Vladimir Crofts, MD  Reason for consult: Left ureteral stone  History of Present Illness: [name] Browning is a 80 y.o. presented to ED on 09/18/2020 with left sided abdominal pain for 12 hours. He denies fevers, chills, nausea, emesis, dysuria, gross hematuria. He denies a history of urolithiasis. He also complains of some SOB that is positional and is being evaluated.  CT A/P 09/18/2020 revealed a 8x42m left proximal ureteral stone with moderate left hydronephrosis. Afebrile. WBC 7.7. Cr 1.09. UA and urine culture pending.  Although eliquis is on his medicine list, he denies taking this medication.  He denies a history of voiding or storage urinary symptoms, hematuria, UTIs, STDs, urolithiasis, GU malignancy/trauma/surgery.  Past Medical History:  Diagnosis Date   Anxiety    Aortic atherosclerosis (HCC)    Atrial fibrillation ([facility] 01/2019   Bilateral carpal tunnel syndrome    CAD (coronary artery disease)    Carpal tunnel syndrome, bilateral    Chronic anticoagulation    COPD (chronic obstructive pulmonary disease) ([facility]    COVID-19 virus infection 02/08/2019   Degenerative disc disease, cervical    DVT (deep venous thrombosis) (HCC)    GERD (gastroesophageal reflux disease)    HLD (hyperlipidemia)    Hypertension    Kidney stones    Leaky heart valve    Normal coronary arteries    a. by cardiac cath in 2004; b. normal stress test in 2007 and 2011; c. Lexiscan 05/20/2014: no significant ischemia, EF 55-65%, no EKG changes, low risk study    Osteoarthritis of right shoulder    Pneumonia    Skin cancer    skin cancer on right ear and right forehead removed   Syncope    TIA (transient ischemic attack) 2016    Past Surgical History:  Procedure Laterality Date   CARDIAC CATHETERIZATION  2004   CARPAL TUNNEL RELEASE Right 06/11/2013   SHOULDER ARTHROSCOPY WITH OPEN ROTATOR CUFF REPAIR Right 08/24/2017   Procedure: SHOULDER ARTHROSCOPY  WITH OPEN ROTATOR CUFF REPAIR;  Surgeon: PCorky Mull MD;  Location: [facility] ORS;  Service: Orthopedics;  Laterality: Right;   SKIN CANCER EXCISION  12/01/2016   right ear    SKIN CANCER EXCISION     remove from the right side of the face    THROMBECTOMY Right 2004   leg   THYROIDECTOMY  1950   Not sure if total or partial thyroidectomy.    Medications:  Home meds:  No current facility-administered medications on file prior to encounter.   Current Outpatient Medications on File Prior to Encounter  Medication Sig Dispense Refill   acetaminophen (TYLENOL) 500 MG tablet Take 2 tablets (1,000 mg total) by mouth every 6 (six) hours as needed for mild pain.     amLODipine (NORVASC) 10 MG tablet Take 10 mg by mouth daily after supper.     apixaban (ELIQUIS) 5 MG TABS tablet Take 1 tablet (5 mg total) by mouth 2 (two) times daily. (Patient not taking: Reported on 04/23/2020) 60 tablet 0   aspirin EC 81 MG tablet Take 81 mg by mouth daily after supper.     atorvastatin (LIPITOR) 40 MG tablet Take 40 mg by mouth daily after supper.     cetirizine (ZYRTEC) 10 MG tablet Take 10 mg by mouth daily after supper.     Cholecalciferol 125 MCG (5000 UT) capsule Take 5,000 Units by mouth daily after supper.     Ferrous Gluconate (IRON) 240 (27 Fe)  MG TABS Take 27 mg by mouth daily after supper.     ibuprofen (ADVIL) 600 MG tablet Take 1 tablet (600 mg total) by mouth every 8 (eight) hours as needed for moderate pain. 60 tablet 1   losartan (COZAAR) 100 MG tablet Take 100 mg by mouth daily after supper.     montelukast (SINGULAIR) 10 MG tablet Take 10 mg by mouth at bedtime.     pantoprazole (PROTONIX) 40 MG tablet Take 40 mg by mouth daily after supper.     SYMBICORT 160-4.5 MCG/ACT inhaler Inhale 2 puffs into the lungs daily.       Scheduled Meds:  acetaminophen  1,000 mg Oral Once   albuterol  5 mg Nebulization Once   ketorolac  7.5 mg Intravenous STAT   Continuous Infusions: PRN  Meds:.  Allergies:  Allergies  Allergen Reactions   Hydromorphone     Other reaction(s): Hallucination Pt reports he doesn't know about this    Family History  Problem Relation Age of Onset   Hypertension Mother    Heart disease Mother    CAD Father    Heart attack Father     Social History:  reports that he quit smoking about 17 years ago. His smoking use included cigarettes. He started smoking about 63 years ago. He has a 46.00 pack-year smoking history. He quit smokeless tobacco use about 17 years ago.  His smokeless tobacco use included snuff. He reports that he does not currently use alcohol after a past usage of about 7.0 standard drinks per week. He reports that he does not use drugs.  ROS: A complete review of systems was performed.  All systems are negative except for pertinent findings as noted.  Physical Exam:  Vital signs in last 24 hours: Temp:  [98 F (36.7 C)] 98 F (36.7 C) (09/09 0926) Pulse Rate:  [79-108] 89 (09/09 1500) Resp:  [18-21] 21 (09/09 1500) BP: (121-146)/(62-75) 126/65 (09/09 1500) SpO2:  [93 %-95 %] 94 % (09/09 1500) Weight:  [80.7 kg] 80.7 kg (09/09 0926) Constitutional:  Alert and oriented, No acute distress Cardiovascular: Regular rate and rhythm Respiratory: Normal respiratory effort GI: Abdomen is soft, nontender, nondistended, no abdominal masses Genitourinary: No CVAT.  Neurologic: Grossly intact, no focal deficits Psychiatric: Normal mood and affect  Laboratory Data:  Recent Labs    09/18/20 0929  WBC 7.7  HGB 13.0  HCT 38.8*  PLT 179    Recent Labs    09/18/20 0929  NA 138  K 3.9  CL 107  GLUCOSE 118*  BUN 19  CALCIUM 9.6  CREATININE 1.09     Results for orders placed or performed during the hospital encounter of 09/18/20 (from the past 24 hour(s))  Lipase, blood     Status: None   Collection Time: 09/18/20  9:29 AM  Result Value Ref Range   Lipase 25 11 - 51 U/L  Comprehensive metabolic panel     Status:  Abnormal   Collection Time: 09/18/20  9:29 AM  Result Value Ref Range   Sodium 138 135 - 145 mmol/L   Potassium 3.9 3.5 - 5.1 mmol/L   Chloride 107 98 - 111 mmol/L   CO2 23 22 - 32 mmol/L   Glucose, Bld 118 (H) 70 - 99 mg/dL   BUN 19 8 - 23 mg/dL   Creatinine, Ser 1.09 0.61 - 1.24 mg/dL   Calcium 9.6 8.9 - 10.3 mg/dL   Total Protein 7.3 6.5 - 8.1 g/dL  Albumin 3.8 3.5 - 5.0 g/dL   AST 26 15 - 41 U/L   ALT 21 0 - 44 U/L   Alkaline Phosphatase 143 (H) 38 - 126 U/L   Total Bilirubin 1.2 0.3 - 1.2 mg/dL   GFR, Estimated >60 >60 mL/min   Anion gap 8 5 - 15  CBC     Status: Abnormal   Collection Time: 09/18/20  9:29 AM  Result Value Ref Range   WBC 7.7 4.0 - 10.5 K/uL   RBC 4.17 (L) 4.22 - 5.81 MIL/uL   Hemoglobin 13.0 13.0 - 17.0 g/dL   HCT 38.8 (L) 39.0 - 52.0 %   MCV 93.0 80.0 - 100.0 fL   MCH 31.2 26.0 - 34.0 pg   MCHC 33.5 30.0 - 36.0 g/dL   RDW 12.9 11.5 - 15.5 %   Platelets 179 150 - 400 K/uL   nRBC 0.0 0.0 - 0.2 %  Resp Panel by RT-PCR (Flu A&B, Covid) Nasopharyngeal Swab     Status: None   Collection Time: 09/18/20 12:34 PM   Specimen: Nasopharyngeal Swab; Nasopharyngeal(NP) swabs in vial transport medium  Result Value Ref Range   SARS Coronavirus 2 by RT PCR NEGATIVE NEGATIVE   Influenza A by PCR NEGATIVE NEGATIVE   Influenza B by PCR NEGATIVE NEGATIVE   Recent Results (from the past 240 hour(s))  Resp Panel by RT-PCR (Flu A&B, Covid) Nasopharyngeal Swab     Status: None   Collection Time: 09/18/20 12:34 PM   Specimen: Nasopharyngeal Swab; Nasopharyngeal(NP) swabs in vial transport medium  Result Value Ref Range Status   SARS Coronavirus 2 by RT PCR NEGATIVE NEGATIVE Final    Comment: (NOTE) SARS-CoV-2 target nucleic acids are NOT DETECTED.  The SARS-CoV-2 RNA is generally detectable in upper respiratory specimens during the acute phase of infection. The lowest concentration of SARS-CoV-2 viral copies this assay can detect is 138 copies/mL. A negative result  does not preclude SARS-Cov-2 infection and should not be used as the sole basis for treatment or other patient management decisions. A negative result may occur with  improper specimen collection/handling, submission of specimen other than nasopharyngeal swab, presence of viral mutation(s) within the areas targeted by this assay, and inadequate number of viral copies(<138 copies/mL). A negative result must be combined with clinical observations, patient history, and epidemiological information. The expected result is Negative.  Fact Sheet for Patients:  EntrepreneurPulse.com.au  Fact Sheet for Healthcare Providers:  IncredibleEmployment.be  This test is no t yet approved or cleared by the [place] FDA and  has been authorized for detection and/or diagnosis of SARS-CoV-2 by FDA under an Emergency Use Authorization (EUA). This EUA will remain  in effect (meaning this test can be used) for the duration of the COVID-19 declaration under Section 564(b)(1) of the Act, 21 U.S.C.section 360bbb-3(b)(1), unless the authorization is terminated  or revoked sooner.       Influenza A by PCR NEGATIVE NEGATIVE Final   Influenza B by PCR NEGATIVE NEGATIVE Final    Comment: (NOTE) The Xpert Xpress SARS-CoV-2/FLU/RSV plus assay is intended as an aid in the diagnosis of influenza from Nasopharyngeal swab specimens and should not be used as a sole basis for treatment. Nasal washings and aspirates are unacceptable for Xpert Xpress SARS-CoV-2/FLU/RSV testing.  Fact Sheet for Patients: EntrepreneurPulse.com.au  Fact Sheet for Healthcare Providers: IncredibleEmployment.be  This test is not yet approved or cleared by the [place] FDA and has been authorized for detection and/or diagnosis of SARS-CoV-2 by  FDA under an Emergency Use Authorization (EUA). This EUA will remain in effect (meaning this test can be used) for  the duration of the COVID-19 declaration under Section 564(b)(1) of the Act, 21 U.S.C. section 360bbb-3(b)(1), unless the authorization is terminated or revoked.  Performed at [facility], [address] [place], [place] [address]     Renal Function: Recent Labs    09/18/20 [id number]  CREATININE 1.09   Estimated Creatinine Clearance: 62.7 mL/min (by C-G formula based on SCr of 1.09 mg/dL).  Radiologic Imaging: DG Chest 2 View  Result Date: 09/18/2020 CLINICAL DATA:  Shortness of breath.  Evaluate for infiltrate. EXAM: CHEST - 2 VIEW COMPARISON:  03/25/2019 and 02/08/2019 a FINDINGS: Diffuse coarse lung markings are suggestive for chronic changes. There is evidence for pleural fluid in the fissures, probably the right major fissure. Scarring at the lung apices. Heart and mediastinum are within normal limits and stable. Increased densities along the periphery of the mid chest bilaterally. IMPRESSION: Diffuse parenchymal lung densities are suggestive for chronic changes. Slightly increased densities along the peripheral aspect of both lungs that could represent acute or chronic disease versus pleural disease. There is probably a small amount of loculated fluid along the fissures. Findings may be better characterized with CT. Electronically Signed   By: Markus Daft M.D.   On: 09/18/2020 11:11   CT CHEST ABDOMEN PELVIS W CONTRAST  Result Date: 09/18/2020 CLINICAL DATA:  recent umbilical hernia repair. eval SBO. Bloating, LLQ pain, emesis EXAM: CT CHEST, ABDOMEN, AND PELVIS WITH CONTRAST TECHNIQUE: Multidetector CT imaging of the chest, abdomen and pelvis was performed following the standard protocol during bolus administration of intravenous contrast. CONTRAST:  148m OMNIPAQUE IOHEXOL 350 MG/ML SOLN COMPARISON:  CT chest 03/25/2019 FINDINGS: CT CHEST FINDINGS Cardiovascular: The heart size is within normal limits. No pericardial effusion. Thoracic aorta is nonaneurysmal. Atherosclerotic  calcifications of the aorta and coronary arteries. Central pulmonary vasculature appears within normal limits. Mediastinum/Nodes: There are a few mildly enlarged mediastinal lymph nodes including 11 mm right paratracheal node (series 2, image 12), unchanged and 11 mm precarinal node (series 2, image 17), unchanged. 14 mm subcarinal node (series 2, image 24) is also unchanged. 10 mm right hilar node is unchanged. No axillary lymphadenopathy. Thyroid and trachea are within normal limits. Small amount of fluid within the esophagus, which appears otherwise unremarkable. Lungs/Pleura: Moderate centrilobular and paraseptal emphysematous changes bilaterally. Small bilateral pleural effusions with fluid tracking into the fissures. Diffuse bronchial wall thickening. Prominent biapical pleuroparenchymal scarring is unchanged in appearance compared to the prior including a more nodular configuration within the left apex. No new focal airspace consolidation. No pneumothorax. Musculoskeletal: No chest wall mass or suspicious bone lesions identified. CT ABDOMEN PELVIS FINDINGS Hepatobiliary: No focal liver abnormality is seen. No gallstones, gallbladder wall thickening, or biliary dilatation. Pancreas: Unremarkable. No pancreatic ductal dilatation or surrounding inflammatory changes. Spleen: Normal in size without focal abnormality. Adrenals/Urinary Tract: Unremarkable adrenal glands. Obstructing 8 x 8 mm stone within the proximal left ureter near the ureteropelvic junction resulting in moderate left-sided hydronephrosis. There are numerous additional stones within the left kidney, largest within the inferior pole measures up to 7 mm. Right kidney is within normal limits. No right renal stone or hydronephrosis. Urinary bladder is unremarkable for the degree of distension. Stomach/Bowel: Stomach is within normal limits. Appendix appears normal. Extensive diverticulosis within the sigmoid and distal descending colon. No evidence of  bowel wall thickening, distention, or inflammatory changes. Vascular/Lymphatic: Aortoiliac atherosclerosis without aneurysm.  No abdominopelvic lymphadenopathy. Reproductive: Prostate gland is enlarged with multiple coarse calcifications. Other: No ascites. No abdominopelvic fluid collections. No pneumoperitoneum. Bilateral fat containing inguinal hernias. Musculoskeletal: Inferior endplate compression fracture of L4 appears chronic although is age indeterminate. Lower lumbar facet arthropathy. IMPRESSION: 1. Obstructing 8 x 8 mm stone within the proximal left ureter near the ureteropelvic junction resulting in moderate left-sided hydronephrosis. 2. Small bilateral pleural effusions. 3. Unchanged mildly enlarged mediastinal and right hilar lymph nodes, likely reactive. 4. Colonic diverticulosis without evidence of acute diverticulitis. 5. Inferior endplate compression fracture of L4 appears chronic although is age indeterminate. 6. Findings of COPD with prominent biapical pleuroparenchymal scarring, similar in appearance to the previous CT. Aortic Atherosclerosis (ICD10-I70.0) and Emphysema (ICD10-J43.9). Electronically Signed   By: Davina Poke D.O.   On: 09/18/2020 15:16    I independently reviewed the above imaging studies.  Impression/Recommendation Left obstructing ureteral stone: CT A/P 09/18/2020 revealed a 8x28m left proximal ureteral stone with moderate left hydronephrosis. Pain controlled. No dysuria. Afebrile. WBC 7.7. Cr 1.09. UA and urine culture pending. Left hydronephrosis secondary to above COPD with SOB  -I discussed options including pain management, ureteral stent placement for pain or primary therapy with ESWL or ureteroscopy with laser lithotripsy. He would like to proceed with ESWL. Hopefully this can be arranged for next week. I will message schedulers.  -We did discuss that if his pain becomes unrelenting, or he develops signs of fevers, we can proceed with stent placement.  Matt  R. Madalen Gavin MD 09/18/2020, 5:54 PM  [facility]  Pager: [phone]  CC: DVladimir Crofts MD

## 2020-09-18 NOTE — ED Notes (Addendum)
Pt states he is still in pain, MD aware and wants to wait until. CT comes back, pt updated as well

## 2020-09-19 ENCOUNTER — Encounter: Payer: Self-pay | Admitting: Internal Medicine

## 2020-09-19 DIAGNOSIS — J811 Chronic pulmonary edema: Secondary | ICD-10-CM | POA: Diagnosis not present

## 2020-09-19 DIAGNOSIS — Z79899 Other long term (current) drug therapy: Secondary | ICD-10-CM | POA: Diagnosis not present

## 2020-09-19 DIAGNOSIS — I480 Paroxysmal atrial fibrillation: Secondary | ICD-10-CM | POA: Diagnosis present

## 2020-09-19 DIAGNOSIS — N132 Hydronephrosis with renal and ureteral calculous obstruction: Secondary | ICD-10-CM | POA: Diagnosis present

## 2020-09-19 DIAGNOSIS — K219 Gastro-esophageal reflux disease without esophagitis: Secondary | ICD-10-CM | POA: Diagnosis present

## 2020-09-19 DIAGNOSIS — E890 Postprocedural hypothyroidism: Secondary | ICD-10-CM | POA: Diagnosis present

## 2020-09-19 DIAGNOSIS — N201 Calculus of ureter: Secondary | ICD-10-CM | POA: Diagnosis not present

## 2020-09-19 DIAGNOSIS — J441 Chronic obstructive pulmonary disease with (acute) exacerbation: Secondary | ICD-10-CM | POA: Diagnosis present

## 2020-09-19 DIAGNOSIS — Z85828 Personal history of other malignant neoplasm of skin: Secondary | ICD-10-CM | POA: Diagnosis not present

## 2020-09-19 DIAGNOSIS — Z8673 Personal history of transient ischemic attack (TIA), and cerebral infarction without residual deficits: Secondary | ICD-10-CM | POA: Diagnosis not present

## 2020-09-19 DIAGNOSIS — R109 Unspecified abdominal pain: Secondary | ICD-10-CM | POA: Diagnosis not present

## 2020-09-19 DIAGNOSIS — Z23 Encounter for immunization: Secondary | ICD-10-CM | POA: Diagnosis present

## 2020-09-19 DIAGNOSIS — N138 Other obstructive and reflux uropathy: Secondary | ICD-10-CM | POA: Diagnosis not present

## 2020-09-19 DIAGNOSIS — Z7982 Long term (current) use of aspirin: Secondary | ICD-10-CM | POA: Diagnosis not present

## 2020-09-19 DIAGNOSIS — I129 Hypertensive chronic kidney disease with stage 1 through stage 4 chronic kidney disease, or unspecified chronic kidney disease: Secondary | ICD-10-CM | POA: Diagnosis present

## 2020-09-19 DIAGNOSIS — Z7901 Long term (current) use of anticoagulants: Secondary | ICD-10-CM | POA: Diagnosis not present

## 2020-09-19 DIAGNOSIS — I959 Hypotension, unspecified: Secondary | ICD-10-CM | POA: Diagnosis present

## 2020-09-19 DIAGNOSIS — R059 Cough, unspecified: Secondary | ICD-10-CM | POA: Diagnosis not present

## 2020-09-19 DIAGNOSIS — N1330 Unspecified hydronephrosis: Secondary | ICD-10-CM | POA: Diagnosis not present

## 2020-09-19 DIAGNOSIS — Z8616 Personal history of COVID-19: Secondary | ICD-10-CM | POA: Diagnosis not present

## 2020-09-19 DIAGNOSIS — E440 Moderate protein-calorie malnutrition: Secondary | ICD-10-CM | POA: Diagnosis present

## 2020-09-19 DIAGNOSIS — I2510 Atherosclerotic heart disease of native coronary artery without angina pectoris: Secondary | ICD-10-CM | POA: Diagnosis present

## 2020-09-19 DIAGNOSIS — N200 Calculus of kidney: Secondary | ICD-10-CM

## 2020-09-19 DIAGNOSIS — J9690 Respiratory failure, unspecified, unspecified whether with hypoxia or hypercapnia: Secondary | ICD-10-CM | POA: Diagnosis not present

## 2020-09-19 DIAGNOSIS — I10 Essential (primary) hypertension: Secondary | ICD-10-CM | POA: Diagnosis not present

## 2020-09-19 DIAGNOSIS — Z8249 Family history of ischemic heart disease and other diseases of the circulatory system: Secondary | ICD-10-CM | POA: Diagnosis not present

## 2020-09-19 DIAGNOSIS — R10817 Generalized abdominal tenderness: Secondary | ICD-10-CM | POA: Diagnosis not present

## 2020-09-19 DIAGNOSIS — J189 Pneumonia, unspecified organism: Secondary | ICD-10-CM | POA: Diagnosis not present

## 2020-09-19 DIAGNOSIS — R0602 Shortness of breath: Secondary | ICD-10-CM | POA: Diagnosis present

## 2020-09-19 DIAGNOSIS — N179 Acute kidney failure, unspecified: Secondary | ICD-10-CM | POA: Diagnosis not present

## 2020-09-19 DIAGNOSIS — R042 Hemoptysis: Secondary | ICD-10-CM | POA: Diagnosis not present

## 2020-09-19 DIAGNOSIS — J449 Chronic obstructive pulmonary disease, unspecified: Secondary | ICD-10-CM | POA: Diagnosis not present

## 2020-09-19 DIAGNOSIS — Z86718 Personal history of other venous thrombosis and embolism: Secondary | ICD-10-CM | POA: Diagnosis not present

## 2020-09-19 DIAGNOSIS — J9601 Acute respiratory failure with hypoxia: Secondary | ICD-10-CM | POA: Diagnosis present

## 2020-09-19 DIAGNOSIS — E785 Hyperlipidemia, unspecified: Secondary | ICD-10-CM | POA: Diagnosis present

## 2020-09-19 DIAGNOSIS — N400 Enlarged prostate without lower urinary tract symptoms: Secondary | ICD-10-CM | POA: Diagnosis present

## 2020-09-19 LAB — BASIC METABOLIC PANEL
Anion gap: 6 (ref 5–15)
BUN: 28 mg/dL — ABNORMAL HIGH (ref 8–23)
CO2: 25 mmol/L (ref 22–32)
Calcium: 9 mg/dL (ref 8.9–10.3)
Chloride: 104 mmol/L (ref 98–111)
Creatinine, Ser: 1.41 mg/dL — ABNORMAL HIGH (ref 0.61–1.24)
GFR, Estimated: 51 mL/min — ABNORMAL LOW (ref >60)
Glucose, Bld: 185 mg/dL — ABNORMAL HIGH (ref 70–99)
Potassium: 4.3 mmol/L (ref 3.5–5.1)
Sodium: 135 mmol/L (ref 135–145)

## 2020-09-19 MED ORDER — ORAL CARE MOUTH RINSE
15.0000 mL | Freq: Two times a day (BID) | OROMUCOSAL | Status: DC
  Administered 2020-09-19 – 2020-09-23 (×9): 15 mL via OROMUCOSAL

## 2020-09-19 MED ORDER — LACTATED RINGERS IV SOLN: INTRAVENOUS | Status: DC

## 2020-09-19 MED ORDER — ENOXAPARIN SODIUM 40 MG/0.4ML IJ SOSY: 40.0000 mg | PREFILLED_SYRINGE | INTRAMUSCULAR | Status: DC

## 2020-09-19 MED ORDER — ATROPINE SULFATE 1 MG/ML IJ SOLN
0.5000 mg | Freq: Once | INTRAMUSCULAR | Status: DC | PRN
  Filled 2020-09-19: qty 0.5

## 2020-09-19 NOTE — Progress Notes (Signed)
PROGRESS NOTE    [name] Browning  [id number] DOB: 1940-11-12 DOA: 09/18/2020 PCP: [place]   Chief complaint.  Shortness of breath. Brief Narrative:  [name] Browning is a 80 y.o. male with medical history significant for COPD, paroxysmal atrial fibrillation on Eliquis, TIA, hyperlipidemia, anxiety and recent umbilical hernia repair who presents with concerns of increasing shortness of breath and left-sided abdominal pain.  Patient is started on steroids for COPD exacerbation.  CT scan also showed left kidney stone with hydronephrosis.  Urology consult obtained.     Assessment & Plan:   Active Problems:   HTN (hypertension)   Hyperlipidemia   COPD exacerbation (HCC)   Acute respiratory failure with hypoxia (HCC)   AF (paroxysmal atrial fibrillation) (HCC)  Acute hypoxemic respiratory failure secondary to COPD exacerbation. COPD exacerbation. Patient still has significant hypoxemia, on 4 L oxygen. Continue IV steroids.  Obstructing left proximal ureter stone with left-sided hydronephrosis Acute kidney injury secondary to obstruction. Patient flank pain has resolved, however creatinine went.  Notified urology. I will start IV fluids, recheck BMP tomorrow.  Paroxysmal atrial fibrillation. Hold off Eliquis in case patient need ureteral stents  Essential hypertension. Continue amlodipine and losartan.     DVT prophylaxis: Lovenox Code Status: full Family Communication:  Disposition Plan:    Status is: Observation    Dispo: The patient is from: Home              Anticipated d/c is to: Home              Patient currently is not medically stable to d/c.   Difficult to place patient No        I/O last 3 completed shifts: In: -  Out: 350 [Urine:350] Total I/O In: 240 [P.O.:240] Out: 600 [Urine:600]     Consultants:  Urology  Procedures: None  Antimicrobials:None  Subjective: Patient still on oxygen, but short of breath is improving, no  wheezing. Cough, nonproductive. No abdominal pain or nausea vomiting today.  Flank pain has resolved. Denies any dysuria hematuria No fever or chills.   Objective: Vitals:   09/18/20 2023 09/19/20 0155 09/19/20 0457 09/19/20 0840  BP: 126/72 104/74 114/67 116/74  Pulse: 99 (!) 58 (!) 55 71  Resp: (!) '24 18 16 20  '$ Temp: 97.6 F (36.4 C) (!) 97.5 F (36.4 C) 97.6 F (36.4 C) 97.6 F (36.4 C)  TempSrc: Oral     SpO2: 97% 96% 95% 93%  Weight: 77.4 kg     Height: '6\' 2"'$  (1.88 m)       Intake/Output Summary (Last 24 hours) at 09/19/2020 1218 Last data filed at 09/19/2020 1044 Gross per 24 hour  Intake 240 ml  Output 950 ml  Net -710 ml   Filed Weights   09/18/20 0926 09/18/20 2023  Weight: 80.7 kg 77.4 kg    Examination:  General exam: Appears calm and comfortable  Respiratory system: Decreased breathing sounds.  Respiratory effort normal. Cardiovascular system: S1 & S2 heard, RRR. No JVD, murmurs, rubs, gallops or clicks. No pedal edema. Gastrointestinal system: Abdomen is nondistended, soft and nontender. No organomegaly or masses felt. Normal bowel sounds heard. Central nervous system: Alert and oriented. No focal neurological deficits. Extremities: Symmetric 5 x 5 power. Skin: No rashes, lesions or ulcers Psychiatry: Judgement and insight appear normal. Mood & affect appropriate.     Data Reviewed: I have personally reviewed following labs and imaging studies  CBC: Recent Labs  Lab 09/18/20 0929  WBC 7.7  HGB 13.0  HCT 38.8*  MCV 93.0  PLT 0000000   Basic Metabolic Panel: Recent Labs  Lab 09/18/20 0929 09/19/20 0441  NA 138 135  K 3.9 4.3  CL 107 104  CO2 23 25  GLUCOSE 118* 185*  BUN 19 28*  CREATININE 1.09 1.41*  CALCIUM 9.6 9.0   GFR: Estimated Creatinine Clearance: 46.5 mL/min (A) (by C-G formula based on SCr of 1.41 mg/dL (H)). Liver Function Tests: Recent Labs  Lab 09/18/20 0929  AST 26  ALT 21  ALKPHOS 143*  BILITOT 1.2  PROT 7.3   ALBUMIN 3.8   Recent Labs  Lab 09/18/20 0929  LIPASE 25   No results for input(s): AMMONIA in the last 168 hours. Coagulation Profile: No results for input(s): INR, PROTIME in the last 168 hours. Cardiac Enzymes: No results for input(s): CKTOTAL, CKMB, CKMBINDEX, TROPONINI in the last 168 hours. BNP (last 3 results) No results for input(s): PROBNP in the last 8760 hours. HbA1C: No results for input(s): HGBA1C in the last 72 hours. CBG: No results for input(s): GLUCAP in the last 168 hours. Lipid Profile: No results for input(s): CHOL, HDL, LDLCALC, TRIG, CHOLHDL, LDLDIRECT in the last 72 hours. Thyroid Function Tests: No results for input(s): TSH, T4TOTAL, FREET4, T3FREE, THYROIDAB in the last 72 hours. Anemia Panel: No results for input(s): VITAMINB12, FOLATE, FERRITIN, TIBC, IRON, RETICCTPCT in the last 72 hours. Sepsis Labs: No results for input(s): PROCALCITON, LATICACIDVEN in the last 168 hours.  Recent Results (from the past 240 hour(s))  Resp Panel by RT-PCR (Flu A&B, Covid) Nasopharyngeal Swab     Status: None   Collection Time: 09/18/20 12:34 PM   Specimen: Nasopharyngeal Swab; Nasopharyngeal(NP) swabs in vial transport medium  Result Value Ref Range Status   SARS Coronavirus 2 by RT PCR NEGATIVE NEGATIVE Final    Comment: (NOTE) SARS-CoV-2 target nucleic acids are NOT DETECTED.  The SARS-CoV-2 RNA is generally detectable in upper respiratory specimens during the acute phase of infection. The lowest concentration of SARS-CoV-2 viral copies this assay can detect is 138 copies/mL. A negative result does not preclude SARS-Cov-2 infection and should not be used as the sole basis for treatment or other patient management decisions. A negative result may occur with  improper specimen collection/handling, submission of specimen other than nasopharyngeal swab, presence of viral mutation(s) within the areas targeted by this assay, and inadequate number of  viral copies(<138 copies/mL). A negative result must be combined with clinical observations, patient history, and epidemiological information. The expected result is Negative.  Fact Sheet for Patients:  EntrepreneurPulse.com.au  Fact Sheet for Healthcare Providers:  IncredibleEmployment.be  This test is no t yet approved or cleared by the [place] FDA and  has been authorized for detection and/or diagnosis of SARS-CoV-2 by FDA under an Emergency Use Authorization (EUA). This EUA will remain  in effect (meaning this test can be used) for the duration of the COVID-19 declaration under Section 564(b)(1) of the Act, 21 U.S.C.section 360bbb-3(b)(1), unless the authorization is terminated  or revoked sooner.       Influenza A by PCR NEGATIVE NEGATIVE Final   Influenza B by PCR NEGATIVE NEGATIVE Final    Comment: (NOTE) The Xpert Xpress SARS-CoV-2/FLU/RSV plus assay is intended as an aid in the diagnosis of influenza from Nasopharyngeal swab specimens and should not be used as a sole basis for treatment. Nasal washings and aspirates are unacceptable for Xpert Xpress SARS-CoV-2/FLU/RSV testing.  Fact Sheet for Patients: EntrepreneurPulse.com.au  Fact Sheet for Healthcare Providers: IncredibleEmployment.be  This test is not yet approved or cleared by the [place] FDA and has been authorized for detection and/or diagnosis of SARS-CoV-2 by FDA under an Emergency Use Authorization (EUA). This EUA will remain in effect (meaning this test can be used) for the duration of the COVID-19 declaration under Section 564(b)(1) of the Act, 21 U.S.C. section 360bbb-3(b)(1), unless the authorization is terminated or revoked.  Performed at [facility], [address] [place], [place] [address]          Radiology Studies: DG Chest 2 View  Result Date: 09/18/2020 CLINICAL DATA:  Shortness of breath.   Evaluate for infiltrate. EXAM: CHEST - 2 VIEW COMPARISON:  03/25/2019 and 02/08/2019 a FINDINGS: Diffuse coarse lung markings are suggestive for chronic changes. There is evidence for pleural fluid in the fissures, probably the right major fissure. Scarring at the lung apices. Heart and mediastinum are within normal limits and stable. Increased densities along the periphery of the mid chest bilaterally. IMPRESSION: Diffuse parenchymal lung densities are suggestive for chronic changes. Slightly increased densities along the peripheral aspect of both lungs that could represent acute or chronic disease versus pleural disease. There is probably a small amount of loculated fluid along the fissures. Findings may be better characterized with CT. Electronically Signed   By: Markus Daft M.D.   On: 09/18/2020 11:11   CT CHEST ABDOMEN PELVIS W CONTRAST  Result Date: 09/18/2020 CLINICAL DATA:  recent umbilical hernia repair. eval SBO. Bloating, LLQ pain, emesis EXAM: CT CHEST, ABDOMEN, AND PELVIS WITH CONTRAST TECHNIQUE: Multidetector CT imaging of the chest, abdomen and pelvis was performed following the standard protocol during bolus administration of intravenous contrast. CONTRAST:  163m OMNIPAQUE IOHEXOL 350 MG/ML SOLN COMPARISON:  CT chest 03/25/2019 FINDINGS: CT CHEST FINDINGS Cardiovascular: The heart size is within normal limits. No pericardial effusion. Thoracic aorta is nonaneurysmal. Atherosclerotic calcifications of the aorta and coronary arteries. Central pulmonary vasculature appears within normal limits. Mediastinum/Nodes: There are a few mildly enlarged mediastinal lymph nodes including 11 mm right paratracheal node (series 2, image 12), unchanged and 11 mm precarinal node (series 2, image 17), unchanged. 14 mm subcarinal node (series 2, image 24) is also unchanged. 10 mm right hilar node is unchanged. No axillary lymphadenopathy. Thyroid and trachea are within normal limits. Small amount of fluid within the  esophagus, which appears otherwise unremarkable. Lungs/Pleura: Moderate centrilobular and paraseptal emphysematous changes bilaterally. Small bilateral pleural effusions with fluid tracking into the fissures. Diffuse bronchial wall thickening. Prominent biapical pleuroparenchymal scarring is unchanged in appearance compared to the prior including a more nodular configuration within the left apex. No new focal airspace consolidation. No pneumothorax. Musculoskeletal: No chest wall mass or suspicious bone lesions identified. CT ABDOMEN PELVIS FINDINGS Hepatobiliary: No focal liver abnormality is seen. No gallstones, gallbladder wall thickening, or biliary dilatation. Pancreas: Unremarkable. No pancreatic ductal dilatation or surrounding inflammatory changes. Spleen: Normal in size without focal abnormality. Adrenals/Urinary Tract: Unremarkable adrenal glands. Obstructing 8 x 8 mm stone within the proximal left ureter near the ureteropelvic junction resulting in moderate left-sided hydronephrosis. There are numerous additional stones within the left kidney, largest within the inferior pole measures up to 7 mm. Right kidney is within normal limits. No right renal stone or hydronephrosis. Urinary bladder is unremarkable for the degree of distension. Stomach/Bowel: Stomach is within normal limits. Appendix appears normal. Extensive diverticulosis within the sigmoid and distal descending colon. No evidence of bowel wall thickening, distention, or inflammatory  changes. Vascular/Lymphatic: Aortoiliac atherosclerosis without aneurysm. No abdominopelvic lymphadenopathy. Reproductive: Prostate gland is enlarged with multiple coarse calcifications. Other: No ascites. No abdominopelvic fluid collections. No pneumoperitoneum. Bilateral fat containing inguinal hernias. Musculoskeletal: Inferior endplate compression fracture of L4 appears chronic although is age indeterminate. Lower lumbar facet arthropathy. IMPRESSION: 1.  Obstructing 8 x 8 mm stone within the proximal left ureter near the ureteropelvic junction resulting in moderate left-sided hydronephrosis. 2. Small bilateral pleural effusions. 3. Unchanged mildly enlarged mediastinal and right hilar lymph nodes, likely reactive. 4. Colonic diverticulosis without evidence of acute diverticulitis. 5. Inferior endplate compression fracture of L4 appears chronic although is age indeterminate. 6. Findings of COPD with prominent biapical pleuroparenchymal scarring, similar in appearance to the previous CT. Aortic Atherosclerosis (ICD10-I70.0) and Emphysema (ICD10-J43.9). Electronically Signed   By: Davina Poke D.O.   On: 09/18/2020 15:16        Scheduled Meds:  amLODipine  10 mg Oral QPC supper   apixaban  5 mg Oral BID   aspirin EC  81 mg Oral QPC supper   atorvastatin  40 mg Oral QPC supper   doxycycline  100 mg Oral Q12H   ferrous gluconate  324 mg Oral Q lunch   influenza vaccine adjuvanted  0.5 mL Intramuscular Tomorrow-1000   mouth rinse  15 mL Mouth Rinse BID   methylPREDNISolone (SOLU-MEDROL) injection  40 mg Intravenous Daily   montelukast  10 mg Oral QHS   pantoprazole  40 mg Oral QPC supper   Continuous Infusions:  lactated ringers 75 mL/hr at 09/19/20 0821     LOS: 0 days    Time spent: 27 minutes    Sharen Hones, MD [facility]   To contact the attending provider between 7A-7P or the covering provider during after hours 7P-7A, please log into the web site [email or web address] and access using universal [place] password for that web site. If you do not have the password, please call the hospital operator.  09/19/2020, 12:18 PM

## 2020-09-19 NOTE — Progress Notes (Signed)
Subjective: Denies pain - well controlled with morphine. No nausea or emesis. Afebrile. Voiding clear yellow urine. Says that his SOB is much improved.  Objective: Vital signs in last 24 hours: Temp:  [97.5 F (36.4 C)-98.2 F (36.8 C)] 97.6 F (36.4 C) (09/10 0840) Pulse Rate:  [55-104] 71 (09/10 0840) Resp:  [16-24] 20 (09/10 0840) BP: (104-127)/(67-86) 116/74 (09/10 0840) SpO2:  [93 %-97 %] 93 % (09/10 0840) Weight:  [77.4 kg] 77.4 kg (09/09 2023)  Intake/Output from previous day: 09/09 0701 - 09/10 0700 In: -  Out: 350 [Urine:350] Intake/Output this shift: Total I/O In: 240 [P.O.:240] Out: 1000 [Urine:1000]  Physical Exam:  General: Alert and oriented CV: RRR Lungs: Clear Abdomen: Soft, ND, NT Ext: NT, No erythema  Lab Results: Recent Labs    09/18/20 0929  HGB 13.0  HCT 38.8*   BMET Recent Labs    09/18/20 0929 09/19/20 0441  NA 138 135  K 3.9 4.3  CL 107 104  CO2 23 25  GLUCOSE 118* 185*  BUN 19 28*  CREATININE 1.09 1.41*  CALCIUM 9.6 9.0     Studies/Results: DG Chest 2 View  Result Date: 09/18/2020 CLINICAL DATA:  Shortness of breath.  Evaluate for infiltrate. EXAM: CHEST - 2 VIEW COMPARISON:  03/25/2019 and 02/08/2019 a FINDINGS: Diffuse coarse lung markings are suggestive for chronic changes. There is evidence for pleural fluid in the fissures, probably the right major fissure. Scarring at the lung apices. Heart and mediastinum are within normal limits and stable. Increased densities along the periphery of the mid chest bilaterally. IMPRESSION: Diffuse parenchymal lung densities are suggestive for chronic changes. Slightly increased densities along the peripheral aspect of both lungs that could represent acute or chronic disease versus pleural disease. There is probably a small amount of loculated fluid along the fissures. Findings may be better characterized with CT. Electronically Signed   By: Markus Daft M.D.   On: 09/18/2020 11:11   CT CHEST  ABDOMEN PELVIS W CONTRAST  Result Date: 09/18/2020 CLINICAL DATA:  recent umbilical hernia repair. eval SBO. Bloating, LLQ pain, emesis EXAM: CT CHEST, ABDOMEN, AND PELVIS WITH CONTRAST TECHNIQUE: Multidetector CT imaging of the chest, abdomen and pelvis was performed following the standard protocol during bolus administration of intravenous contrast. CONTRAST:  110m OMNIPAQUE IOHEXOL 350 MG/ML SOLN COMPARISON:  CT chest 03/25/2019 FINDINGS: CT CHEST FINDINGS Cardiovascular: The heart size is within normal limits. No pericardial effusion. Thoracic aorta is nonaneurysmal. Atherosclerotic calcifications of the aorta and coronary arteries. Central pulmonary vasculature appears within normal limits. Mediastinum/Nodes: There are a few mildly enlarged mediastinal lymph nodes including 11 mm right paratracheal node (series 2, image 12), unchanged and 11 mm precarinal node (series 2, image 17), unchanged. 14 mm subcarinal node (series 2, image 24) is also unchanged. 10 mm right hilar node is unchanged. No axillary lymphadenopathy. Thyroid and trachea are within normal limits. Small amount of fluid within the esophagus, which appears otherwise unremarkable. Lungs/Pleura: Moderate centrilobular and paraseptal emphysematous changes bilaterally. Small bilateral pleural effusions with fluid tracking into the fissures. Diffuse bronchial wall thickening. Prominent biapical pleuroparenchymal scarring is unchanged in appearance compared to the prior including a more nodular configuration within the left apex. No new focal airspace consolidation. No pneumothorax. Musculoskeletal: No chest wall mass or suspicious bone lesions identified. CT ABDOMEN PELVIS FINDINGS Hepatobiliary: No focal liver abnormality is seen. No gallstones, gallbladder wall thickening, or biliary dilatation. Pancreas: Unremarkable. No pancreatic ductal dilatation or surrounding inflammatory changes. Spleen: Normal in size  without focal abnormality.  Adrenals/Urinary Tract: Unremarkable adrenal glands. Obstructing 8 x 8 mm stone within the proximal left ureter near the ureteropelvic junction resulting in moderate left-sided hydronephrosis. There are numerous additional stones within the left kidney, largest within the inferior pole measures up to 7 mm. Right kidney is within normal limits. No right renal stone or hydronephrosis. Urinary bladder is unremarkable for the degree of distension. Stomach/Bowel: Stomach is within normal limits. Appendix appears normal. Extensive diverticulosis within the sigmoid and distal descending colon. No evidence of bowel wall thickening, distention, or inflammatory changes. Vascular/Lymphatic: Aortoiliac atherosclerosis without aneurysm. No abdominopelvic lymphadenopathy. Reproductive: Prostate gland is enlarged with multiple coarse calcifications. Other: No ascites. No abdominopelvic fluid collections. No pneumoperitoneum. Bilateral fat containing inguinal hernias. Musculoskeletal: Inferior endplate compression fracture of L4 appears chronic although is age indeterminate. Lower lumbar facet arthropathy. IMPRESSION: 1. Obstructing 8 x 8 mm stone within the proximal left ureter near the ureteropelvic junction resulting in moderate left-sided hydronephrosis. 2. Small bilateral pleural effusions. 3. Unchanged mildly enlarged mediastinal and right hilar lymph nodes, likely reactive. 4. Colonic diverticulosis without evidence of acute diverticulitis. 5. Inferior endplate compression fracture of L4 appears chronic although is age indeterminate. 6. Findings of COPD with prominent biapical pleuroparenchymal scarring, similar in appearance to the previous CT. Aortic Atherosclerosis (ICD10-I70.0) and Emphysema (ICD10-J43.9). Electronically Signed   By: Davina Poke D.O.   On: 09/18/2020 15:16    Assessment/Plan: Left obstructing ureteral stone: CT A/P 09/18/2020 revealed a 8x24m left proximal ureteral stone with moderate left  hydronephrosis. Pain controlled. No dysuria. Afebrile. WBC 7.7. Cr 1.09. UA and urine culture pending. Left hydronephrosis secondary to above AKI COPD excacerbation  -His pain remains well controlled, denies any at present -Creatinine bump today at 1.41 from 1.09. He is voiding adequate amount. Continue hydration. -I again discussed options including stent placement and he favors ESWL as long as pain remains controlled. Will seek to arrange this week. We discussed that he will need to hold eliquis. Will ask IM to hold lovenox/anticoagulation   LOS: 0 days   Matt R. Bowdy Bair MD 09/19/2020, 3:19 PM [facility]  Pager: [phone]

## 2020-09-19 NOTE — Plan of Care (Signed)
  Problem: Elimination: Goal: Will not experience complications related to urinary retention Outcome: Progressing   Problem: Pain Managment: Goal: General experience of comfort will improve Outcome: Progressing   Problem: Safety: Goal: Ability to remain free from injury will improve Outcome: Progressing   

## 2020-09-19 NOTE — Progress Notes (Signed)
Mobility Specialist - Progress Note   09/19/20 1400  Mobility  Activity Ambulated in room  Level of Assistance Minimal assist, patient does 75% or more  Assistive Device None  Distance Ambulated (ft) 20 ft  Mobility Ambulated with assistance in room  Mobility Response Tolerated well  Mobility performed by Mobility specialist  $Mobility charge 1 Mobility    Pre-mobility: 83 HR, 92% SpO2 During mobility: 86 HR, 93% SpO2 Post-mobility: 78 HR, 93% SpO2   Pt utilizing RA on arrival, sats at 92% resting. Supervision to sit EOB with sats dropping to 87% with transfer. 2L  applied to increase sats to 95%. Pt ambulated in room with minA for steadying. O2 maintained 93% during ambulation with oxygen assist. Fatigues quickly, limiting further distance. Pt returned to bed with alarm set.    Kathee Delton Mobility Specialist 09/19/20, 2:36 PM

## 2020-09-20 ENCOUNTER — Encounter
Admission: EM | Disposition: A | Discharge status: Home Health | Payer: Self-pay | Source: Home / Self Care | Attending: Student

## 2020-09-20 ENCOUNTER — Inpatient Hospital Stay: Payer: Medicare HMO

## 2020-09-20 ENCOUNTER — Encounter: Payer: Self-pay | Admitting: Internal Medicine

## 2020-09-20 ENCOUNTER — Inpatient Hospital Stay: Payer: Medicare HMO | Admitting: Certified Registered Nurse Anesthetist

## 2020-09-20 DIAGNOSIS — J441 Chronic obstructive pulmonary disease with (acute) exacerbation: Secondary | ICD-10-CM | POA: Diagnosis not present

## 2020-09-20 DIAGNOSIS — N200 Calculus of kidney: Secondary | ICD-10-CM | POA: Diagnosis not present

## 2020-09-20 DIAGNOSIS — J9601 Acute respiratory failure with hypoxia: Secondary | ICD-10-CM | POA: Diagnosis not present

## 2020-09-20 DIAGNOSIS — N179 Acute kidney failure, unspecified: Secondary | ICD-10-CM | POA: Diagnosis not present

## 2020-09-20 DIAGNOSIS — N1330 Unspecified hydronephrosis: Secondary | ICD-10-CM | POA: Diagnosis not present

## 2020-09-20 DIAGNOSIS — J449 Chronic obstructive pulmonary disease, unspecified: Secondary | ICD-10-CM | POA: Diagnosis not present

## 2020-09-20 DIAGNOSIS — N138 Other obstructive and reflux uropathy: Secondary | ICD-10-CM

## 2020-09-20 DIAGNOSIS — J9600 Acute respiratory failure, unspecified whether with hypoxia or hypercapnia: Secondary | ICD-10-CM

## 2020-09-20 DIAGNOSIS — N201 Calculus of ureter: Secondary | ICD-10-CM | POA: Diagnosis not present

## 2020-09-20 HISTORY — DX: Acute respiratory failure, unspecified whether with hypoxia or hypercapnia: J96.00

## 2020-09-20 HISTORY — PX: CYSTOSCOPY W/ URETERAL STENT PLACEMENT: SHX1429

## 2020-09-20 LAB — BASIC METABOLIC PANEL
Anion gap: 8 (ref 5–15)
BUN: 31 mg/dL — ABNORMAL HIGH (ref 8–23)
CO2: 25 mmol/L (ref 22–32)
Calcium: 9.3 mg/dL (ref 8.9–10.3)
Chloride: 105 mmol/L (ref 98–111)
Creatinine, Ser: 1.31 mg/dL — ABNORMAL HIGH (ref 0.61–1.24)
GFR, Estimated: 55 mL/min — ABNORMAL LOW (ref >60)
Glucose, Bld: 135 mg/dL — ABNORMAL HIGH (ref 70–99)
Potassium: 4.9 mmol/L (ref 3.5–5.1)
Sodium: 138 mmol/L (ref 135–145)

## 2020-09-20 LAB — BLOOD GAS, ARTERIAL
Acid-Base Excess: 0.8 mmol/L (ref 0.0–2.0)
Bicarbonate: 26 mmol/L (ref 20.0–28.0)
Delivery systems: POSITIVE
Expiratory PAP: 4
FIO2: 50
Inspiratory PAP: 6
Mechanical Rate: 16
O2 Saturation: 90.7 %
Patient temperature: 37
RATE: 16 resp/min
pCO2 arterial: 43 mmHg (ref 32.0–48.0)
pH, Arterial: 7.39 (ref 7.350–7.450)
pO2, Arterial: 61 mmHg — ABNORMAL LOW (ref 83.0–108.0)

## 2020-09-20 LAB — CBC WITH DIFFERENTIAL/PLATELET
Abs Immature Granulocytes: 0.12 10*3/uL — ABNORMAL HIGH (ref 0.00–0.07)
Basophils Absolute: 0 10*3/uL (ref 0.0–0.1)
Basophils Relative: 0 %
Eosinophils Absolute: 0 10*3/uL (ref 0.0–0.5)
Eosinophils Relative: 0 %
HCT: 41 % (ref 39.0–52.0)
Hemoglobin: 13.3 g/dL (ref 13.0–17.0)
Immature Granulocytes: 1 %
Lymphocytes Relative: 3 %
Lymphs Abs: 0.6 10*3/uL — ABNORMAL LOW (ref 0.7–4.0)
MCH: 30.5 pg (ref 26.0–34.0)
MCHC: 32.4 g/dL (ref 30.0–36.0)
MCV: 94 fL (ref 80.0–100.0)
Monocytes Absolute: 1.4 10*3/uL — ABNORMAL HIGH (ref 0.1–1.0)
Monocytes Relative: 7 %
Neutro Abs: 18.6 10*3/uL — ABNORMAL HIGH (ref 1.7–7.7)
Neutrophils Relative %: 89 %
Platelets: 191 10*3/uL (ref 150–400)
RBC: 4.36 MIL/uL (ref 4.22–5.81)
RDW: 12.9 % (ref 11.5–15.5)
WBC: 20.7 10*3/uL — ABNORMAL HIGH (ref 4.0–10.5)
nRBC: 0 % (ref 0.0–0.2)

## 2020-09-20 LAB — MAGNESIUM: Magnesium: 2.3 mg/dL (ref 1.7–2.4)

## 2020-09-20 LAB — PROCALCITONIN: Procalcitonin: <0.1 ng/mL

## 2020-09-20 SURGERY — CYSTOSCOPY, WITH RETROGRADE PYELOGRAM AND URETERAL STENT INSERTION
Anesthesia: General | Site: Ureter | Laterality: Left

## 2020-09-20 MED ORDER — FUROSEMIDE 10 MG/ML IJ SOLN
80.0000 mg | Freq: Once | INTRAMUSCULAR | Status: AC
  Administered 2020-09-20: 80 mg via INTRAVENOUS

## 2020-09-20 MED ORDER — PROPOFOL 10 MG/ML IV BOLUS
INTRAVENOUS | Status: AC
  Filled 2020-09-20: qty 20

## 2020-09-20 MED ORDER — ONDANSETRON HCL 4 MG/2ML IJ SOLN: 4.0000 mg | Freq: Once | INTRAMUSCULAR | Status: DC | PRN

## 2020-09-20 MED ORDER — AMIODARONE LOAD VIA INFUSION
150.0000 mg | Freq: Once | INTRAVENOUS | Status: AC
  Administered 2020-09-20: 150 mg via INTRAVENOUS
  Filled 2020-09-20: qty 83.34

## 2020-09-20 MED ORDER — AMIODARONE HCL IN DEXTROSE 360-4.14 MG/200ML-% IV SOLN
INTRAVENOUS | Status: AC
  Administered 2020-09-20: 60 mg/h via INTRAVENOUS
  Filled 2020-09-20: qty 200

## 2020-09-20 MED ORDER — AMIODARONE HCL IN DEXTROSE 360-4.14 MG/200ML-% IV SOLN: 30.0000 mg/h | INTRAVENOUS | Status: DC

## 2020-09-20 MED ORDER — AMIODARONE HCL IN DEXTROSE 360-4.14 MG/200ML-% IV SOLN: 60.0000 mg/h | INTRAVENOUS | Status: AC

## 2020-09-20 MED ORDER — SODIUM CHLORIDE 0.9 % IR SOLN
Status: DC | PRN
  Administered 2020-09-20: 3000 mL

## 2020-09-20 MED ORDER — LIDOCAINE HCL (CARDIAC) PF 100 MG/5ML IV SOSY
PREFILLED_SYRINGE | INTRAVENOUS | Status: DC | PRN
  Administered 2020-09-20: 60 mg via INTRAVENOUS

## 2020-09-20 MED ORDER — STERILE WATER FOR IRRIGATION IR SOLN
Status: DC | PRN
  Administered 2020-09-20: 1000 mL

## 2020-09-20 MED ORDER — LACTULOSE 10 GM/15ML PO SOLN
20.0000 g | Freq: Once | ORAL | Status: AC
  Administered 2020-09-20: 20 g via ORAL
  Filled 2020-09-20: qty 30

## 2020-09-20 MED ORDER — ACETAMINOPHEN 10 MG/ML IV SOLN
INTRAVENOUS | Status: AC
  Filled 2020-09-20: qty 100

## 2020-09-20 MED ORDER — DEXMEDETOMIDINE (PRECEDEX) IN NS 20 MCG/5ML (4 MCG/ML) IV SYRINGE
PREFILLED_SYRINGE | INTRAVENOUS | Status: DC | PRN
  Administered 2020-09-20: 4 ug via INTRAVENOUS

## 2020-09-20 MED ORDER — SODIUM CHLORIDE 0.9 % IV SOLN
1.0000 g | INTRAVENOUS | Status: DC
  Administered 2020-09-20: 1 g via INTRAVENOUS
  Filled 2020-09-20 (×2): qty 10

## 2020-09-20 MED ORDER — FENTANYL CITRATE (PF) 100 MCG/2ML IJ SOLN
INTRAMUSCULAR | Status: DC | PRN
  Administered 2020-09-20: 25 ug via INTRAVENOUS

## 2020-09-20 MED ORDER — ONDANSETRON HCL 4 MG/2ML IJ SOLN
INTRAMUSCULAR | Status: DC | PRN
  Administered 2020-09-20: 4 mg via INTRAVENOUS

## 2020-09-20 MED ORDER — SODIUM CHLORIDE 0.9 % IV SOLN
12.5000 mg | Freq: Four times a day (QID) | INTRAVENOUS | Status: DC | PRN
  Administered 2020-09-20: 12.5 mg via INTRAVENOUS
  Filled 2020-09-20 (×3): qty 0.5

## 2020-09-20 MED ORDER — PROPOFOL 500 MG/50ML IV EMUL
INTRAVENOUS | Status: DC | PRN
  Administered 2020-09-20: 30 mg via INTRAVENOUS

## 2020-09-20 MED ORDER — CHLORHEXIDINE GLUCONATE CLOTH 2 % EX PADS
6.0000 | MEDICATED_PAD | Freq: Every day | CUTANEOUS | Status: DC
  Administered 2020-09-20 – 2020-09-22 (×3): 6 via TOPICAL

## 2020-09-20 MED ORDER — PROPOFOL 10 MG/ML IV BOLUS
INTRAVENOUS | Status: DC | PRN
  Administered 2020-09-20: 125 ug/kg/min via INTRAVENOUS

## 2020-09-20 MED ORDER — IPRATROPIUM-ALBUTEROL 0.5-2.5 (3) MG/3ML IN SOLN
RESPIRATORY_TRACT | Status: AC
  Administered 2020-09-20: 3 mL via RESPIRATORY_TRACT
  Filled 2020-09-20: qty 3

## 2020-09-20 MED ORDER — SODIUM CHLORIDE 0.9 % IV SOLN
3.0000 g | Freq: Four times a day (QID) | INTRAVENOUS | Status: DC
  Administered 2020-09-20 – 2020-09-21 (×3): 3 g via INTRAVENOUS
  Filled 2020-09-20 (×4): qty 8
  Filled 2020-09-20: qty 3
  Filled 2020-09-20: qty 8

## 2020-09-20 MED ORDER — AMIODARONE IV BOLUS ONLY 150 MG/100ML
INTRAVENOUS | Status: AC
  Administered 2020-09-20: 150 mg
  Filled 2020-09-20: qty 100

## 2020-09-20 MED ORDER — IPRATROPIUM-ALBUTEROL 0.5-2.5 (3) MG/3ML IN SOLN: 3.0000 mL | Freq: Once | RESPIRATORY_TRACT | Status: AC

## 2020-09-20 MED ORDER — PHENYLEPHRINE HCL (PRESSORS) 10 MG/ML IV SOLN
INTRAVENOUS | Status: DC | PRN
  Administered 2020-09-20 (×2): 100 ug via INTRAVENOUS

## 2020-09-20 MED ORDER — FENTANYL CITRATE (PF) 100 MCG/2ML IJ SOLN
INTRAMUSCULAR | Status: AC
  Filled 2020-09-20: qty 2

## 2020-09-20 MED ORDER — FUROSEMIDE 10 MG/ML IJ SOLN
40.0000 mg | Freq: Every day | INTRAMUSCULAR | Status: DC
  Administered 2020-09-21: 40 mg via INTRAVENOUS
  Filled 2020-09-20: qty 4

## 2020-09-20 MED ORDER — FENTANYL CITRATE (PF) 100 MCG/2ML IJ SOLN: 25.0000 ug | INTRAMUSCULAR | Status: DC | PRN

## 2020-09-20 MED ORDER — IOHEXOL 180 MG/ML  SOLN
INTRAMUSCULAR | Status: DC | PRN
  Administered 2020-09-20: 5 mL

## 2020-09-20 SURGICAL SUPPLY — 19 items
BRUSH SCRUB EZ 1% IODOPHOR (MISCELLANEOUS) ×2 IMPLANT
CATH URETL OPEN 5X70 (CATHETERS) IMPLANT
GAUZE 4X4 16PLY ~~LOC~~+RFID DBL (SPONGE) ×4 IMPLANT
GLOVE SURG UNDER POLY LF SZ7.5 (GLOVE) ×2 IMPLANT
GOWN STRL REUS W/ TWL LRG LVL3 (GOWN DISPOSABLE) ×1 IMPLANT
GOWN STRL REUS W/ TWL XL LVL3 (GOWN DISPOSABLE) ×1 IMPLANT
GOWN STRL REUS W/TWL LRG LVL3 (GOWN DISPOSABLE) ×1
GOWN STRL REUS W/TWL XL LVL3 (GOWN DISPOSABLE) ×1
GUIDEWIRE STR DUAL SENSOR (WIRE) ×2 IMPLANT
IV NS IRRIG 3000ML ARTHROMATIC (IV SOLUTION) ×2 IMPLANT
KIT TURNOVER CYSTO (KITS) ×2 IMPLANT
MANIFOLD NEPTUNE II (INSTRUMENTS) ×2 IMPLANT
PACK CYSTO AR (MISCELLANEOUS) ×2 IMPLANT
SET CYSTO W/LG BORE CLAMP LF (SET/KITS/TRAYS/PACK) ×2 IMPLANT
STENT URET 6FRX26 CONTOUR (STENTS) ×1 IMPLANT
SURGILUBE 2OZ TUBE FLIPTOP (MISCELLANEOUS) ×2 IMPLANT
SYR 10ML LL (SYRINGE) ×2 IMPLANT
WATER STERILE IRR 1000ML POUR (IV SOLUTION) ×2 IMPLANT
WATER STERILE IRR 500ML POUR (IV SOLUTION) ×2 IMPLANT

## 2020-09-20 NOTE — Progress Notes (Signed)
Patient developed severe respiratory distress and hypoxemia 1 hour post op of ureteral stent. Lungs with crackles,, heart irregular, tachycardiac. Consistent with acute pulmonary edema. Give 80 mg iv lasix, bipap, amio for fast a fib. Transfer to ICU.

## 2020-09-20 NOTE — Op Note (Signed)
Operative Note  Preoperative diagnosis:  1.  Obstructing left ureteral stone with refractory pain and AKI  Postoperative diagnosis: 1.  Obstructing left ureteral stone with refractory pain and AKI  Procedure(s): 1.  Cystoscopy 2.  Left retrograde pyelogram with interpretation 3.  Left ureteral stent placement 4. Fluoroscopy <1 hour with intraoperative interpretation  Surgeon: Rexene Alberts, MD  Assistants:  None  Anesthesia:  General  Complications:  None  EBL: Minimal  Specimens: 1.  Left renal pelvis urine culture  Drains/Catheters: 1.  Left 6Fr x 26cm ureteral stent with proximal curl in left upper pole and distal curl within bladder  Intraoperative findings:   Cystoscopy demonstrated no suspicious lesions, masses, stones or other pathology. Left retrograde pyelogram demonstrated moderate left hydronephrosis. Successful left ureteral stent placement with curl in the renal pelvis and bladder respectively.  Indication:  [name] Browning is a 80 y.o. male with CT A/P 09/18/2020 with an 8 mm left proximal ureteral stone with refractory pain.  After reviewing the management options for treatment, he elected to proceed with the above surgical procedure(s). We have discussed the potential benefits and risks of the procedure, side effects of the proposed treatment, the likelihood of the patient achieving the goals of the procedure, and any potential problems that might occur during the procedure or recuperation. Informed consent has been obtained.  Description of procedure: The patient was taken to the operating room and general anesthesia was induced.  The patient was placed in the dorsal lithotomy position, prepped and draped in the usual sterile fashion, and preoperative antibiotics were administered. A preoperative time-out was performed.   Cystourethroscopy was performed.  The patient's urethra was examined and was normal. There was some bilobar prostatic hypertrophy. The bladder was  then systematically examined in its entirety. There was no evidence for any bladder tumors, stones, or other mucosal pathology.    Attention then turned to the left ureteral orifice. A 0.038 zip wire was passed through the left orifice and over the wire a 5 Fr open ended catheter was inserted and passed up to the level of the renal pelvis. Aspirate was obtained and sent off as left renal pelvis urine for culture. Omnipaque contrast was injected through the ureteral catheter and a retrograde pyelogram was performed with findings as dictated above. The wire was then replaced and the open ended catheter was removed.   A 6Fr x 26cm ureteral stent was advance over the wire. The stent was positioned appropriately under fluoroscopic and cystoscopic guidance.  The wire was then removed with an adequate stent curl noted in the left upper pole as well as in the bladder.  The bladder was then emptied and the procedure ended.  The patient appeared to tolerate the procedure well and without complications.  The patient was able to be awakened and transferred to the recovery unit in satisfactory condition.   Plan: I messaged schedulers to arrange follow-up in [place] to arrange for definitive left ureteroscopy with laser lithotripsy.  [facility]  Pager: [phone]

## 2020-09-20 NOTE — Anesthesia Preprocedure Evaluation (Signed)
Anesthesia Evaluation  Patient identified by MRN, date of birth, ID band Patient awake    Reviewed: Allergy & Precautions, H&P , NPO status , Patient's Chart, lab work & pertinent test results, reviewed documented beta blocker date and time   Airway Mallampati: III  TM Distance: >3 FB Neck ROM: full    Dental  (+) Teeth Intact, Poor Dentition   Pulmonary neg pulmonary ROS, shortness of breath and with exertion, pneumonia, COPD, former smoker,    Pulmonary exam normal        Cardiovascular Exercise Tolerance: Poor hypertension, On Medications + CAD  negative cardio ROS Normal cardiovascular examAtrial Fibrillation  Rate:Normal     Neuro/Psych Anxiety TIA Neuromuscular disease negative neurological ROS  negative psych ROS   GI/Hepatic negative GI ROS, Neg liver ROS, GERD  Medicated,  Endo/Other  negative endocrine ROS  Renal/GU Renal diseasenegative Renal ROS  negative genitourinary   Musculoskeletal   Abdominal   Peds  Hematology negative hematology ROS (+)   Anesthesia Other Findings   Reproductive/Obstetrics negative OB ROS                             Anesthesia Physical Anesthesia Plan  ASA: 3 and emergent  Anesthesia Plan: General LMA   Post-op Pain Management:    Induction:   PONV Risk Score and Plan: 3  Airway Management Planned:   Additional Equipment:   Intra-op Plan:   Post-operative Plan:   Informed Consent: I have reviewed the patients History and Physical, chart, labs and discussed the procedure including the risks, benefits and alternatives for the proposed anesthesia with the patient or authorized representative who has indicated his/her understanding and acceptance.       Plan Discussed with: CRNA  Anesthesia Plan Comments:         Anesthesia Quick Evaluation

## 2020-09-20 NOTE — Progress Notes (Signed)
   09/20/20 1715  Clinical Encounter Type  Visited With Family  Visit Type Initial;Spiritual support;Social support  Spiritual Encounters  Spiritual Needs Emotional  Chaplain Burris assisted family members who were not permitted to convene in ICU waiting area and were upset outside hospital. Chaplain helped family gather and feel attended to; chaplain also communicated with ICU staff to determine Pt status and assess possibility for visitation. Chaplain did escort extended family to the ICU waiting area but reiterated the policy that permits only the original two family members to visit Pt at bedside. Family was grateful and agreed to limit visit to ICU waiting in order to receive updates and support each other. Chaplain provided hospitality to family members (beverages) and engaged them to learn more about their situation. Daryel November helped family to feel seen and heard and this seemed to make a great difference in their experience.

## 2020-09-20 NOTE — Interval H&P Note (Signed)
History and Physical Interval Note:  09/20/2020 12:53 PM  [name] Browning  has presented today for surgery, with the diagnosis of LEFT URETERAL STONE.  The various methods of treatment have been discussed with the patient and family. After consideration of risks, benefits and other options for treatment, the patient has consented to  Procedure(s): CYSTOSCOPY WITH RETROGRADE PYELOGRAM/URETERAL STENT PLACEMENT (Left) as a surgical intervention.  The patient's history has been reviewed, patient examined, no change in status, stable for surgery.  I have reviewed the patient's chart and labs.  Questions were answered to the patient's satisfaction.     Janith Lima

## 2020-09-20 NOTE — Anesthesia Procedure Notes (Signed)
Date/Time: 09/20/2020 1:23 PM Performed by: Lily Peer, Sherisa Gilvin, CRNA Pre-anesthesia Checklist: Patient identified, Emergency Drugs available and Timeout performed Patient Re-evaluated:Patient Re-evaluated prior to induction Oxygen Delivery Method: Simple face mask Induction Type: IV induction

## 2020-09-20 NOTE — Transfer of Care (Signed)
Immediate Anesthesia Transfer of Care Note  Patient: [name] Browning  Procedure(s) Performed: CYSTOSCOPY WITH RETROGRADE PYELOGRAM/URETERAL STENT PLACEMENT (Left: Ureter)  Patient Location: PACU  Anesthesia Type:General  Level of Consciousness: drowsy  Airway & Oxygen Therapy: Patient Spontanous Breathing and Patient connected to face mask oxygen  Post-op Assessment: Report given to RN and Post -op Vital signs reviewed and stable  Post vital signs: Reviewed and stable  Last Vitals:  Vitals Value Taken Time  BP 96/49   Temp    Pulse 79 09/20/20 1347  Resp 25 09/20/20 1347  SpO2 100 % 09/20/20 1347  Vitals shown include unvalidated device data.  Last Pain:  Vitals:   09/20/20 1126  TempSrc: Oral  PainSc:       Patients Stated Pain Goal: 0 ([id number])  Complications: No notable events documented.

## 2020-09-20 NOTE — Consult Note (Signed)
CRITICAL CARE PROGRESS NOTE    Name: [name] Browning MRN: [id number] DOB: [birth year]     LOS: 1   SUBJECTIVE FINDINGS & SIGNIFICANT EVENTS    Patient description:  80 yo M with hx of COPD PAF TIA DLD GAD was found to have obstructive urolithiasis and is s/p L ureteral stent placement.  After procedure he was noted to be tachypneic with labored breathing and brought up to MICU.  He responded well to BIPAP at 45% with normoxia and lucid mentation conversive with family at bedside. On arrival he received lasix and made >250cc urine and felt further improved. He had repeat CXR after MICU admission.  PCCM consult for medical optimization.   Lines/tubes : Urethral Catheter Latex 16 Fr. (Active)     Urethral Catheter Skeet Latch, RN Double-lumen;Latex 16 Fr. (Active)     Ureteral Drain/Stent Left ureter 6 Fr. (Active)  Site Assessment Clean;Intact 09/20/20 1443  Dressing Status None 09/20/20 1443    Microbiology/Sepsis markers: Results for orders placed or performed during the hospital encounter of 09/18/20  Resp Panel by RT-PCR (Flu A&B, Covid) Nasopharyngeal Swab     Status: None   Collection Time: 09/18/20 12:34 PM   Specimen: Nasopharyngeal Swab; Nasopharyngeal(NP) swabs in vial transport medium  Result Value Ref Range Status   SARS Coronavirus 2 by RT PCR NEGATIVE NEGATIVE Final    Comment: (NOTE) SARS-CoV-2 target nucleic acids are NOT DETECTED.  The SARS-CoV-2 RNA is generally detectable in upper respiratory specimens during the acute phase of infection. The lowest concentration of SARS-CoV-2 viral copies this assay can detect is 138 copies/mL. A negative result does not preclude SARS-Cov-2 infection and should not be used as the sole basis for treatment or other patient management decisions. A  negative result may occur with  improper specimen collection/handling, submission of specimen other than nasopharyngeal swab, presence of viral mutation(s) within the areas targeted by this assay, and inadequate number of viral copies(<138 copies/mL). A negative result must be combined with clinical observations, patient history, and epidemiological information. The expected result is Negative.  Fact Sheet for Patients:  EntrepreneurPulse.com.au  Fact Sheet for Healthcare Providers:  IncredibleEmployment.be  This test is no t yet approved or cleared by the [place] FDA and  has been authorized for detection and/or diagnosis of SARS-CoV-2 by FDA under an Emergency Use Authorization (EUA). This EUA will remain  in effect (meaning this test can be used) for the duration of the COVID-19 declaration under Section 564(b)(1) of the Act, 21 U.S.C.section 360bbb-3(b)(1), unless the authorization is terminated  or revoked sooner.       Influenza A by PCR NEGATIVE NEGATIVE Final   Influenza B by PCR NEGATIVE NEGATIVE Final    Comment: (NOTE) The Xpert Xpress SARS-CoV-2/FLU/RSV plus assay is intended as an aid in the diagnosis of influenza from Nasopharyngeal swab specimens and should not be used as a sole basis for treatment. Nasal washings and aspirates are unacceptable for Xpert Xpress SARS-CoV-2/FLU/RSV testing.  Fact Sheet for Patients: EntrepreneurPulse.com.au  Fact Sheet for Healthcare Providers: IncredibleEmployment.be  This test is not yet approved or cleared by the [place] FDA and has been authorized for detection and/or diagnosis of SARS-CoV-2 by FDA under an Emergency Use Authorization (EUA). This EUA will remain in effect (meaning this test can be used) for the duration of the COVID-19 declaration under Section 564(b)(1) of the Act, 21 U.S.C. section 360bbb-3(b)(1), unless the authorization  is terminated or revoked.  Performed at [facility],  [place], [place] [address]     Anti-infectives:  Anti-infectives (From admission, onward)    Start     Dose/Rate Route Frequency Ordered Stop   09/20/20 1000  cefTRIAXone (ROCEPHIN) 1 g in sodium chloride 0.9 % 100 mL IVPB        1 g 200 mL/hr over 30 Minutes Intravenous Every 24 hours 09/20/20 0731     09/18/20 2200  doxycycline (VIBRA-TABS) tablet 100 mg        100 mg Oral Every 12 hours 09/18/20 1905          Consults: Treatment Team:  Janith Lima, MD Ottie Glazier, MD     PAST MEDICAL HISTORY   Past Medical History:  Diagnosis Date   Anxiety    Aortic atherosclerosis [facility])    Atrial fibrillation ([place]) 01/2019   Bilateral carpal tunnel syndrome    CAD (coronary artery disease)    Carpal tunnel syndrome, bilateral    Chronic anticoagulation    COPD (chronic obstructive pulmonary disease) ([place])    COVID-19 virus infection 02/08/2019   Degenerative disc disease, cervical    DVT (deep venous thrombosis) ([facility])    GERD (gastroesophageal reflux disease)    HLD (hyperlipidemia)    Hypertension    Kidney stones    Leaky heart valve    Normal coronary arteries    a. by cardiac cath in 2004; b. normal stress test in 2007 and 2011; c. Lexiscan 05/20/2014: no significant ischemia, EF 55-65%, no EKG changes, low risk study    Osteoarthritis of right shoulder    Pneumonia    Skin cancer    skin cancer on right ear and right forehead removed   Syncope    TIA (transient ischemic attack) 2016     SURGICAL HISTORY   Past Surgical History:  Procedure Laterality Date   CARDIAC CATHETERIZATION  2004   CARPAL TUNNEL RELEASE Right 06/11/2013   SHOULDER ARTHROSCOPY WITH OPEN ROTATOR CUFF REPAIR Right 08/24/2017   Procedure: SHOULDER ARTHROSCOPY WITH OPEN ROTATOR CUFF REPAIR;  Surgeon: Corky Mull, MD;  Location: [facility] ORS;  Service: Orthopedics;  Laterality: Right;   SKIN CANCER EXCISION   12/01/2016   right ear    SKIN CANCER EXCISION     remove from the right side of the face    THROMBECTOMY Right 2004   leg   THYROIDECTOMY  1950   Not sure if total or partial thyroidectomy.     FAMILY HISTORY   Family History  Problem Relation Age of Onset   Hypertension Mother    Heart disease Mother    CAD Father    Heart attack Father      SOCIAL HISTORY   Social History   Tobacco Use   Smoking status: Former    Packs/day: 1.00    Years: 46.00    Pack years: 46.00    Types: Cigarettes    Start date: 01/10/1957    Quit date: 02/11/2003    Years since quitting: 17.6   Smokeless tobacco: Former    Types: Snuff    Quit date: 02/11/2003  Vaping Use   Vaping Use: Never used  Substance Use Topics   Alcohol use: Not Currently    Alcohol/week: 7.0 standard drinks    Types: 7 Cans of beer per week   Drug use: No     MEDICATIONS   Current Medication:  Current Facility-Administered Medications:    [COMPLETED] amiodarone (NEXTERONE) 1.8 mg/mL load via infusion 150  mg, 150 mg, Intravenous, Once, 150 mg at 09/20/20 1708 **FOLLOWED BY** amiodarone (NEXTERONE PREMIX) 360-4.14 MG/200ML-% (1.8 mg/mL) IV infusion, 60 mg/hr, Intravenous, Continuous, Last Rate: 33.3 mL/hr at 09/20/20 1716, 60 mg/hr at 09/20/20 1716 **FOLLOWED BY** amiodarone (NEXTERONE PREMIX) 360-4.14 MG/200ML-% (1.8 mg/mL) IV infusion, 30 mg/hr, Intravenous, Continuous, Roosevelt Locks, Dekui, MD   amLODipine (NORVASC) tablet 10 mg, 10 mg, Oral, QPC supper, Tu, Ching T, DO, 10 mg at 09/19/20 1723   aspirin EC tablet 81 mg, 81 mg, Oral, QPC supper, Tu, Ching T, DO, 81 mg at 09/19/20 1723   atorvastatin (LIPITOR) tablet 40 mg, 40 mg, Oral, QPC supper, Tu, Ching T, DO, 40 mg at 09/19/20 1723   atropine injection 0.5 mg, 0.5 mg, Intravenous, Once PRN, Mansy, Jan A, MD   cefTRIAXone (ROCEPHIN) 1 g in sodium chloride 0.9 % 100 mL IVPB, 1 g, Intravenous, Q24H, Sharen Hones, MD, Last Rate: 200 mL/hr at 09/20/20 0901, 1 g at  09/20/20 0901   Chlorhexidine Gluconate Cloth 2 % PADS 6 each, 6 each, Topical, Daily, Ottie Glazier, MD, 6 each at 09/20/20 1732   doxycycline (VIBRA-TABS) tablet 100 mg, 100 mg, Oral, Q12H, Tu, Ching T, DO, 100 mg at 09/20/20 0850   ferrous gluconate (FERGON) tablet 324 mg, 324 mg, Oral, Q lunch, Tu, Ching T, DO, 324 mg at 09/19/20 1143   HYDROcodone-acetaminophen (NORCO/VICODIN) 5-325 MG per tablet 1 tablet, 1 tablet, Oral, Q4H PRN, Tu, Ching T, DO, 1 tablet at 09/19/20 2321   ipratropium-albuterol (DUONEB) 0.5-2.5 (3) MG/3ML nebulizer solution 3 mL, 3 mL, Nebulization, Q6H PRN, Tu, Ching T, DO   lactated ringers infusion, , Intravenous, Continuous, Sharen Hones, MD, Last Rate: 300 mL/hr at 09/20/20 1337, Rate Change at 09/20/20 1337   MEDLINE mouth rinse, 15 mL, Mouth Rinse, BID, Cox, Amy N, DO, 15 mL at 09/20/20 0851   methylPREDNISolone sodium succinate (SOLU-MEDROL) 40 mg/mL injection 40 mg, 40 mg, Intravenous, Daily, Tu, Ching T, DO, 40 mg at 09/20/20 0850   montelukast (SINGULAIR) tablet 10 mg, 10 mg, Oral, QHS, Tu, Ching T, DO, 10 mg at 09/19/20 2023   morphine 2 MG/ML injection 2 mg, 2 mg, Intravenous, Q4H PRN, Tu, Ching T, DO   ondansetron (ZOFRAN) injection 4 mg, 4 mg, Intravenous, Q6H PRN, Tu, Ching T, DO, 4 mg at 09/20/20 1550   pantoprazole (PROTONIX) EC tablet 40 mg, 40 mg, Oral, QPC supper, Tu, Ching T, DO, 40 mg at 09/19/20 1723   promethazine (PHENERGAN) 12.5 mg in sodium chloride 0.9 % 50 mL IVPB, 12.5 mg, Intravenous, Q6H PRN, Mansy, Arvella Merles, MD, Stopped at 09/20/20 0519    ALLERGIES   Hydromorphone    REVIEW OF SYSTEMS    10 point ROS done and is negative except for being sleepy and tired. Specifically denies abd or chest pain confusion or any other new symptoms.   PHYSICAL EXAMINATION   Vital Signs: Temp:  [97 F (36.1 C)-97.9 F (36.6 C)] 97.5 F (36.4 C) (09/11 1445) Pulse Rate:  [62-114] 114 (09/11 1700) Resp:  [16-33] 33 (09/11 1700) BP:  (84-156)/(49-85) 156/77 (09/11 1700) SpO2:  [91 %-100 %] 91 % (09/11 1700)  GENERAL:NAD age appropriate on BIPAP HEAD: Normocephalic, atraumatic.  EYES: Pupils equal, round, reactive to light.  No scleral icterus.  MOUTH: Moist mucosal membrane. NECK: Supple. No thyromegaly. No nodules. No JVD.  PULMONARY: mild rhonchi CARDIOVASCULAR: S1 and S2. Regular rate and rhythm. No murmurs, rubs, or gallops.  GASTROINTESTINAL: Soft, nontender, non-distended. No masses. Positive bowel  sounds. No hepatosplenomegaly.  MUSCULOSKELETAL: No swelling, clubbing, or edema.  NEUROLOGIC: Mild distress due to acute illness no FND SKIN:intact,warm,dry   PERTINENT DATA     Infusions:  amiodarone 60 mg/hr (09/20/20 1716)   Followed by   amiodarone     cefTRIAXone (ROCEPHIN)  IV 1 g (09/20/20 0901)   lactated ringers 300 mL/hr at 09/20/20 1337   promethazine (PHENERGAN) injection (IM or IVPB) Stopped (09/20/20 0519)   Scheduled Medications:  amLODipine  10 mg Oral QPC supper   aspirin EC  81 mg Oral QPC supper   atorvastatin  40 mg Oral QPC supper   Chlorhexidine Gluconate Cloth  6 each Topical Daily   doxycycline  100 mg Oral Q12H   ferrous gluconate  324 mg Oral Q lunch   mouth rinse  15 mL Mouth Rinse BID   methylPREDNISolone (SOLU-MEDROL) injection  40 mg Intravenous Daily   montelukast  10 mg Oral QHS   pantoprazole  40 mg Oral QPC supper   PRN Medications: atropine, HYDROcodone-acetaminophen, ipratropium-albuterol, morphine injection, ondansetron (ZOFRAN) IV, promethazine (PHENERGAN) injection (IM or IVPB) Hemodynamic parameters:   Intake/Output: 09/10 0701 - 09/11 0700 In: 1817.7 [P.O.:480; I.V.:1287.7; IV Piggyback:50] Out: [id number]; Emesis/NG output:1]  Ventilator  Settings:    LAB RESULTS:  Basic Metabolic Panel: Recent Labs  Lab 09/18/20 0929 09/19/20 0441 09/20/20 0418  NA 138 135 138  K 3.9 4.3 4.9  CL 107 104 105  CO2 '23 25 25  '$ GLUCOSE 118* 185* 135*  BUN  19 28* 31*  CREATININE 1.09 1.41* 1.31*  CALCIUM 9.6 9.0 9.3  MG  --   --  2.3   Liver Function Tests: Recent Labs  Lab 09/18/20 0929  AST 26  ALT 21  ALKPHOS 143*  BILITOT 1.2  PROT 7.3  ALBUMIN 3.8   Recent Labs  Lab 09/18/20 0929  LIPASE 25   No results for input(s): AMMONIA in the last 168 hours. CBC: Recent Labs  Lab 09/18/20 0929 09/20/20 0418  WBC 7.7 20.7*  NEUTROABS  --  18.6*  HGB 13.0 13.3  HCT 38.8* 41.0  MCV 93.0 94.0  PLT 179 191   Cardiac Enzymes: No results for input(s): CKTOTAL, CKMB, CKMBINDEX, TROPONINI in the last 168 hours. BNP: Invalid input(s): POCBNP CBG: No results for input(s): GLUCAP in the last 168 hours.     IMAGING RESULTS:  Imaging: DG Chest Port 1 View  Result Date: 09/20/2020 CLINICAL DATA:  Increased cough and shortness of breath. EXAM: PORTABLE CHEST 1 VIEW COMPARISON:  September 18, 2020 FINDINGS: Cardiomediastinal silhouette is normal. Mediastinal contours appear intact. Diffuse bilateral interstitial opacities and coarsening of the interstitium. Osseous structures are without acute abnormality. Soft tissues are grossly normal. IMPRESSION: Diffuse bilateral interstitial opacities and coarsening of the interstitium, not significantly changed. Electronically Signed   By: Fidela Salisbury M.D.   On: 09/20/2020 16:36   DG OR UROLOGY CYSTO IMAGE ([place])  Result Date: 09/20/2020 There is no interpretation for this exam.  This order is for images obtained during a surgical procedure.  Please See "Surgeries" Tab for more information regarding the procedure.   '@PROBHOSP'$ @ DG Chest Port 1 View  Result Date: 09/20/2020 CLINICAL DATA:  Increased cough and shortness of breath. EXAM: PORTABLE CHEST 1 VIEW COMPARISON:  September 18, 2020 FINDINGS: Cardiomediastinal silhouette is normal. Mediastinal contours appear intact. Diffuse bilateral interstitial opacities and coarsening of the interstitium. Osseous structures are without acute  abnormality. Soft tissues are grossly normal. IMPRESSION: Diffuse bilateral  interstitial opacities and coarsening of the interstitium, not significantly changed. Electronically Signed   By: Fidela Salisbury M.D.   On: 09/20/2020 16:36   DG OR UROLOGY CYSTO IMAGE ([place])  Result Date: 09/20/2020 There is no interpretation for this exam.  This order is for images obtained during a surgical procedure.  Please See "Surgeries" Tab for more information regarding the procedure.         ASSESSMENT AND PLAN    -Multidisciplinary rounds held today  Acute Hypoxic Respiratory Failure -due to post operative atelectasis with interstitial edema -COVID19 negative  -perform RVP please   - lasix '80mg'$  x1 IV with improvement  -repeat CXR -bilateral interstitial edema worse on left   - ABG stat  - continue lasix daily and repeat cxr -empric unasyn since patient was complaining of vomitus on arrival to ER with potential for aspiration component -respiratory cultures - exptectorated sputum -Nasal MRSA screen -procalcitonin - in context of aki   Left obstructing ureteral stone Urology on case  S/p Cystoscopy 2.  Left retrograde pyelogram with interpretation 3.  Left ureteral stent placement -oxygen as needed -Lasix as tolerated ICU monitoring  Acute renal failure stage 2 KDIGO    - likley post procedure transient trauma related with obstructive L ureteral stone s/p stent     -has foley - non-oliguric -mildly blood tinged    GI/Nutrition GI PROPHYLAXIS as indicated DIET-->TF's as tolerated Constipation protocol as indicated  ENDO - ICU hypoglycemic\Hyperglycemia protocol -check FSBS per protocol   ELECTROLYTES -follow labs as needed -replace as needed -pharmacy consultation   DVT/GI PRX ordered -SCDs  TRANSFUSIONS AS NEEDED MONITOR FSBS ASSESS the need for LABS as needed   Critical care provider statement:   Total critical care time: 33 minutes   Performed by:  Lanney Gins MD   Critical care time was exclusive of separately billable procedures and treating other patients.   Critical care was necessary to treat or prevent imminent or life-threatening deterioration.   Critical care was time spent personally by me on the following activities: development of treatment plan with patient and/or surrogate as well as nursing, discussions with consultants, evaluation of patient's response to treatment, examination of patient, obtaining history from patient or surrogate, ordering and performing treatments and interventions, ordering and review of laboratory studies, ordering and review of radiographic studies, pulse oximetry and re-evaluation of patient's condition.      This document was prepared using Dragon voice recognition software and may include unintentional dictation errors.    Ottie Glazier, M.D.  Division of [place]

## 2020-09-20 NOTE — Plan of Care (Addendum)
Patient nauseous  and vomited blood tinge emesis per patietn twice at 0140. Administered Zofran and phenergan about 2 hours later. Is now resting asleep in bed. Abdominal pain has remained 2-4 on 1-10 scale.   Problem: Activity: Goal: Risk for activity intolerance will decrease 09/20/2020 0541 by Isaiah Serge, RN Outcome: Progressing 09/20/2020 0540 by Isaiah Serge, RN Outcome: Progressing   Problem: Elimination: Goal: Will not experience complications related to urinary retention Outcome: Progressing   Problem: Pain Managment: Goal: General experience of comfort will improve 09/20/2020 0541 by Isaiah Serge, RN Outcome: Progressing 09/20/2020 0540 by Isaiah Serge, RN Outcome: Progressing   Problem: Safety: Goal: Ability to remain free from injury will improve Outcome: Progressing   Problem: Respiratory: Goal: Ability to maintain a clear airway will improve Outcome: Progressing

## 2020-09-20 NOTE — Consult Note (Signed)
Pharmacy Antibiotic Note  [name] Browning is a 80 y.o. male admitted on 09/18/2020 with pneumonia.  Pharmacy has been consulted for Unasyn dosing. dosing.  Plan: Unasyn 3 g IV q6h  Monitor clinical picture and renal function F/U C&S, abx deescalation / LOT   Height: '6\' 2"'$  (188 cm) Weight: 77.4 kg (170 lb 10.2 oz) IBW/kg (Calculated) : 82.2  Temp (24hrs), Avg:97.5 F (36.4 C), Min:97 F (36.1 C), Max:97.9 F (36.6 C)  Recent Labs  Lab 09/18/20 0929 09/19/20 0441 09/20/20 0418  WBC 7.7  --  20.7*  CREATININE 1.09 1.41* 1.31*    Estimated Creatinine Clearance: 50.1 mL/min (A) (by C-G formula based on SCr of 1.31 mg/dL (H)).    Allergies  Allergen Reactions   Hydromorphone     Other reaction(s): Hallucination Pt reports he doesn't know about this    Antimicrobials this admission: 9/9 doxycycline >> 9/11 9/11 ceftriaxone x 1 9/11 Unasyn >>   Dose adjustments this admission: N/A  Microbiology results: 9/11 Resp panel: pending  9/11 UCx: pending  9/11 Sputum: pending  9/11 MRSA PCR: pending  Thank you for allowing pharmacy to be a part of this patient's care.  Darnelle Bos, PharmD 09/20/2020 6:18 PM

## 2020-09-20 NOTE — Progress Notes (Signed)
PROGRESS NOTE    [name] Browning  [id number] DOB: 08/06/1940 DOA: 09/18/2020 PCP: [place] [place]   Chief complaint.  Shortness of breath. Brief Narrative:  [name] Browning is a 80 y.o. male with medical history significant for COPD, paroxysmal atrial fibrillation on Eliquis, TIA, hyperlipidemia, anxiety and recent umbilical hernia repair who presents with concerns of increasing shortness of breath and left-sided abdominal pain.  Patient is started on steroids for COPD exacerbation.  CT scan also showed left kidney stone with hydronephrosis.  Urology consult obtained.   Assessment & Plan:   Active Problems:   HTN (hypertension)   Hyperlipidemia   COPD exacerbation (HCC)   Acute respiratory failure with hypoxia (HCC)   AF (paroxysmal atrial fibrillation) (HCC)   Urinary tract obstruction by kidney stone   AKI (acute kidney injury) ([facility])  Acute hypoxemic respiratory failure secondary to COPD exacerbation. COPD exacerbation. Condition improving, on 2 L oxygen.  Not on home oxygen. Continue IV steroids. Patient on Rocephin for possible urinary tract infection.    Obstructing left proximal ureter stone with left-sided hydronephrosis Acute kidney injury secondary to obstruction. She has worsening leukocytosis today, started Rocephin. Urine culture was sent out. Patient renal function not improving on fluids. Seen by urology, considering ureteral stent.  Paroxysmal atrial fibrillation. Hold off Eliquis for possible urology intervention.  Essential hypertension. Continue losartan and amlodipine.  Constipation.  Give lactulose.   DVT prophylaxis: SCDs, coagulation on hold anticipating urology procedure Code Status: full Family Communication:  Disposition Plan:    Status is: Inpatient  Remains inpatient appropriate because:Ongoing diagnostic testing needed not appropriate for outpatient work up, IV treatments appropriate due to intensity of illness or inability to take  PO, and Inpatient level of care appropriate due to severity of illness  Dispo: The patient is from: Home              Anticipated d/c is to: Home              Patient currently is not medically stable to d/c.   Difficult to place patient No        I/O last 3 completed shifts: In: 1817.7 [P.O.:480; I.V.:1287.7; IV Piggyback:50] Out: 2176 [Urine:2175; Emesis/NG output:1] No intake/output data recorded.     Consultants:  urology  Procedures: Pending  Antimicrobials: Rocephin Subjective: Patient feels better with short of breath, still cough, nonproductive.  He slept well last night without short of breath. No fever or chills. Abdominal pain has been better, no nausea vomiting.  Feel constipated, last bowel movement was Friday. No chest pain or palpitation.  Objective: Vitals:   09/19/20 1616 09/19/20 1932 09/20/20 0428 09/20/20 0748  BP: 111/63 117/72 (!) 143/85 137/78  Pulse: 66 79  62  Resp: '18 16 18   '$ Temp: (!) 97.3 F (36.3 C) 97.6 F (36.4 C) 97.8 F (36.6 C) (!) 97.5 F (36.4 C)  TempSrc: Oral Oral Oral Oral  SpO2: 97% 96%  97%  Weight:      Height:        Intake/Output Summary (Last 24 hours) at 09/20/2020 1013 Last data filed at 09/20/2020 0617 Gross per 24 hour  Intake 1817.71 ml  Output 1626 ml  Net 191.71 ml   Filed Weights   09/18/20 0926 09/18/20 2023  Weight: 80.7 kg 77.4 kg    Examination:  General exam: Appears calm and comfortable  Respiratory system: Decreased breathing sounds without crackles or wheezes. Respiratory effort normal. Cardiovascular system: S1 & S2 heard, RRR.  No JVD, murmurs, rubs, gallops or clicks. No pedal edema. Gastrointestinal system: Abdomen is nondistended, soft and nontender. No organomegaly or masses felt. Normal bowel sounds heard. Central nervous system: Alert and oriented. No focal neurological deficits. Extremities: Symmetric 5 x 5 power. Skin: No rashes, lesions or ulcers Psychiatry: Judgement and insight  appear normal. Mood & affect appropriate.     Data Reviewed: I have personally reviewed following labs and imaging studies  CBC: Recent Labs  Lab 09/18/20 0929 09/20/20 0418  WBC 7.7 20.7*  NEUTROABS  --  18.6*  HGB 13.0 13.3  HCT 38.8* 41.0  MCV 93.0 94.0  PLT 179 [id number]   Basic Metabolic Panel: Recent Labs  Lab 09/18/20 0929 09/19/20 0441 09/20/20 0418  NA 138 135 138  K 3.9 4.3 4.9  CL 107 104 105  CO2 '23 25 25  '$ GLUCOSE 118* 185* 135*  BUN 19 28* 31*  CREATININE 1.09 1.41* 1.31*  CALCIUM 9.6 9.0 9.3  MG  --   --  2.3   GFR: Estimated Creatinine Clearance: 50.1 mL/min (A) (by C-G formula based on SCr of 1.31 mg/dL (H)). Liver Function Tests: Recent Labs  Lab 09/18/20 0929  AST 26  ALT 21  ALKPHOS 143*  BILITOT 1.2  PROT 7.3  ALBUMIN 3.8   Recent Labs  Lab 09/18/20 0929  LIPASE 25   No results for input(s): AMMONIA in the last 168 hours. Coagulation Profile: No results for input(s): INR, PROTIME in the last 168 hours. Cardiac Enzymes: No results for input(s): CKTOTAL, CKMB, CKMBINDEX, TROPONINI in the last 168 hours. BNP (last 3 results) No results for input(s): PROBNP in the last 8760 hours. HbA1C: No results for input(s): HGBA1C in the last 72 hours. CBG: No results for input(s): GLUCAP in the last 168 hours. Lipid Profile: No results for input(s): CHOL, HDL, LDLCALC, TRIG, CHOLHDL, LDLDIRECT in the last 72 hours. Thyroid Function Tests: No results for input(s): TSH, T4TOTAL, FREET4, T3FREE, THYROIDAB in the last 72 hours. Anemia Panel: No results for input(s): VITAMINB12, FOLATE, FERRITIN, TIBC, IRON, RETICCTPCT in the last 72 hours. Sepsis Labs: No results for input(s): PROCALCITON, LATICACIDVEN in the last 168 hours.  Recent Results (from the past 240 hour(s))  Resp Panel by RT-PCR (Flu A&B, Covid) Nasopharyngeal Swab     Status: None   Collection Time: 09/18/20 12:34 PM   Specimen: Nasopharyngeal Swab; Nasopharyngeal(NP) swabs in vial  transport medium  Result Value Ref Range Status   SARS Coronavirus 2 by RT PCR NEGATIVE NEGATIVE Final    Comment: (NOTE) SARS-CoV-2 target nucleic acids are NOT DETECTED.  The SARS-CoV-2 RNA is generally detectable in upper respiratory specimens during the acute phase of infection. The lowest concentration of SARS-CoV-2 viral copies this assay can detect is 138 copies/mL. A negative result does not preclude SARS-Cov-2 infection and should not be used as the sole basis for treatment or other patient management decisions. A negative result may occur with  improper specimen collection/handling, submission of specimen other than nasopharyngeal swab, presence of viral mutation(s) within the areas targeted by this assay, and inadequate number of viral copies(<138 copies/mL). A negative result must be combined with clinical observations, patient history, and epidemiological information. The expected result is Negative.  Fact Sheet for Patients:  EntrepreneurPulse.com.au  Fact Sheet for Healthcare Providers:  IncredibleEmployment.be  This test is no t yet approved or cleared by the [place] FDA and  has been authorized for detection and/or diagnosis of SARS-CoV-2 by FDA under an Emergency Use  Authorization (EUA). This EUA will remain  in effect (meaning this test can be used) for the duration of the COVID-19 declaration under Section 564(b)(1) of the Act, 21 U.S.C.section 360bbb-3(b)(1), unless the authorization is terminated  or revoked sooner.       Influenza A by PCR NEGATIVE NEGATIVE Final   Influenza B by PCR NEGATIVE NEGATIVE Final    Comment: (NOTE) The Xpert Xpress SARS-CoV-2/FLU/RSV plus assay is intended as an aid in the diagnosis of influenza from Nasopharyngeal swab specimens and should not be used as a sole basis for treatment. Nasal washings and aspirates are unacceptable for Xpert Xpress SARS-CoV-2/FLU/RSV testing.  Fact  Sheet for Patients: EntrepreneurPulse.com.au  Fact Sheet for Healthcare Providers: IncredibleEmployment.be  This test is not yet approved or cleared by the [place] FDA and has been authorized for detection and/or diagnosis of SARS-CoV-2 by FDA under an Emergency Use Authorization (EUA). This EUA will remain in effect (meaning this test can be used) for the duration of the COVID-19 declaration under Section 564(b)(1) of the Act, 21 U.S.C. section 360bbb-3(b)(1), unless the authorization is terminated or revoked.  Performed at [facility], [address], [place], [place] [address]          Radiology Studies: DG Chest 2 View  Result Date: 09/18/2020 CLINICAL DATA:  Shortness of breath.  Evaluate for infiltrate. EXAM: CHEST - 2 VIEW COMPARISON:  03/25/2019 and 02/08/2019 a FINDINGS: Diffuse coarse lung markings are suggestive for chronic changes. There is evidence for pleural fluid in the fissures, probably the right major fissure. Scarring at the lung apices. Heart and mediastinum are within normal limits and stable. Increased densities along the periphery of the mid chest bilaterally. IMPRESSION: Diffuse parenchymal lung densities are suggestive for chronic changes. Slightly increased densities along the peripheral aspect of both lungs that could represent acute or chronic disease versus pleural disease. There is probably a small amount of loculated fluid along the fissures. Findings may be better characterized with CT. Electronically Signed   By: Markus Daft M.D.   On: 09/18/2020 11:11   CT CHEST ABDOMEN PELVIS W CONTRAST  Result Date: 09/18/2020 CLINICAL DATA:  recent umbilical hernia repair. eval SBO. Bloating, LLQ pain, emesis EXAM: CT CHEST, ABDOMEN, AND PELVIS WITH CONTRAST TECHNIQUE: Multidetector CT imaging of the chest, abdomen and pelvis was performed following the standard protocol during bolus administration of intravenous  contrast. CONTRAST:  155m OMNIPAQUE IOHEXOL 350 MG/ML SOLN COMPARISON:  CT chest 03/25/2019 FINDINGS: CT CHEST FINDINGS Cardiovascular: The heart size is within normal limits. No pericardial effusion. Thoracic aorta is nonaneurysmal. Atherosclerotic calcifications of the aorta and coronary arteries. Central pulmonary vasculature appears within normal limits. Mediastinum/Nodes: There are a few mildly enlarged mediastinal lymph nodes including 11 mm right paratracheal node (series 2, image 12), unchanged and 11 mm precarinal node (series 2, image 17), unchanged. 14 mm subcarinal node (series 2, image 24) is also unchanged. 10 mm right hilar node is unchanged. No axillary lymphadenopathy. Thyroid and trachea are within normal limits. Small amount of fluid within the esophagus, which appears otherwise unremarkable. Lungs/Pleura: Moderate centrilobular and paraseptal emphysematous changes bilaterally. Small bilateral pleural effusions with fluid tracking into the fissures. Diffuse bronchial wall thickening. Prominent biapical pleuroparenchymal scarring is unchanged in appearance compared to the prior including a more nodular configuration within the left apex. No new focal airspace consolidation. No pneumothorax. Musculoskeletal: No chest wall mass or suspicious bone lesions identified. CT ABDOMEN PELVIS FINDINGS Hepatobiliary: No focal liver abnormality is seen. No  gallstones, gallbladder wall thickening, or biliary dilatation. Pancreas: Unremarkable. No pancreatic ductal dilatation or surrounding inflammatory changes. Spleen: Normal in size without focal abnormality. Adrenals/Urinary Tract: Unremarkable adrenal glands. Obstructing 8 x 8 mm stone within the proximal left ureter near the ureteropelvic junction resulting in moderate left-sided hydronephrosis. There are numerous additional stones within the left kidney, largest within the inferior pole measures up to 7 mm. Right kidney is within normal limits. No right  renal stone or hydronephrosis. Urinary bladder is unremarkable for the degree of distension. Stomach/Bowel: Stomach is within normal limits. Appendix appears normal. Extensive diverticulosis within the sigmoid and distal descending colon. No evidence of bowel wall thickening, distention, or inflammatory changes. Vascular/Lymphatic: Aortoiliac atherosclerosis without aneurysm. No abdominopelvic lymphadenopathy. Reproductive: Prostate gland is enlarged with multiple coarse calcifications. Other: No ascites. No abdominopelvic fluid collections. No pneumoperitoneum. Bilateral fat containing inguinal hernias. Musculoskeletal: Inferior endplate compression fracture of L4 appears chronic although is age indeterminate. Lower lumbar facet arthropathy. IMPRESSION: 1. Obstructing 8 x 8 mm stone within the proximal left ureter near the ureteropelvic junction resulting in moderate left-sided hydronephrosis. 2. Small bilateral pleural effusions. 3. Unchanged mildly enlarged mediastinal and right hilar lymph nodes, likely reactive. 4. Colonic diverticulosis without evidence of acute diverticulitis. 5. Inferior endplate compression fracture of L4 appears chronic although is age indeterminate. 6. Findings of COPD with prominent biapical pleuroparenchymal scarring, similar in appearance to the previous CT. Aortic Atherosclerosis (ICD10-I70.0) and Emphysema (ICD10-J43.9). Electronically Signed   By: Davina Poke D.O.   On: 09/18/2020 15:16        Scheduled Meds:  amLODipine  10 mg Oral QPC supper   aspirin EC  81 mg Oral QPC supper   atorvastatin  40 mg Oral QPC supper   doxycycline  100 mg Oral Q12H   ferrous gluconate  324 mg Oral Q lunch   mouth rinse  15 mL Mouth Rinse BID   methylPREDNISolone (SOLU-MEDROL) injection  40 mg Intravenous Daily   montelukast  10 mg Oral QHS   pantoprazole  40 mg Oral QPC supper   Continuous Infusions:  cefTRIAXone (ROCEPHIN)  IV 1 g (09/20/20 0901)   lactated ringers 75 mL/hr  at 09/20/20 0617   promethazine (PHENERGAN) injection (IM or IVPB) Stopped (09/20/20 0519)     LOS: 1 day    Time spent: 32 minutes    Sharen Hones, MD [facility]   To contact the attending provider between 7A-7P or the covering provider during after hours 7P-7A, please log into the web site [email or web address] and access using universal [place] password for that web site. If you do not have the password, please call the hospital operator.  09/20/2020, 10:13 AM

## 2020-09-20 NOTE — Anesthesia Postprocedure Evaluation (Deleted)
Anesthesia Post Note  Patient: [name] Browning  Procedure(s) Performed: CYSTOSCOPY WITH RETROGRADE PYELOGRAM/URETERAL STENT PLACEMENT (Left: Ureter)  Patient location during evaluation: PACU Anesthesia Type: General Level of consciousness: awake and alert Pain management: pain level controlled Vital Signs Assessment: post-procedure vital signs reviewed and stable Respiratory status: spontaneous breathing, nonlabored ventilation, respiratory function stable and patient connected to nasal cannula oxygen Cardiovascular status: blood pressure returned to baseline and stable Postop Assessment: no apparent nausea or vomiting Anesthetic complications: no   No notable events documented.   Last Vitals:  Vitals:   09/20/20 1430 09/20/20 1445  BP: 109/62 (!) 100/53  Pulse: 86 67  Resp: (!) 21 20  Temp:  (!) 36.4 C  SpO2: 94% 92%    Last Pain:  Vitals:   09/20/20 1445  TempSrc:   PainSc: 0-No pain                 Molli Barrows

## 2020-09-20 NOTE — H&P (View-Only) (Signed)
Day of Surgery Subjective: C/o persistent left flank pain, controlled intermittently. Had nausea and emesis. Afebrile.  Objective: Vital signs in last 24 hours: Temp:  [97 F (36.1 C)-97.9 F (36.6 C)] 97 F (36.1 C) (09/11 1228) Pulse Rate:  [62-79] 64 (09/11 1126) Resp:  [16-18] 16 (09/11 1228) BP: (111-143)/(63-85) 122/69 (09/11 1228) SpO2:  [95 %-98 %] 98 % (09/11 1228)  Intake/Output from previous day: 09/10 0701 - 09/11 0700 In: 1817.7 [P.O.:480; I.V.:1287.7; IV Piggyback:50] Out: [id number]; Emesis/NG output:1] Intake/Output this shift: No intake/output data recorded.  Physical Exam:  General: Alert and oriented CV: RRR Lungs: Clear Abdomen: Soft, ND, NT Ext: NT, No erythema  Lab Results: Recent Labs    09/18/20 0929 09/20/20 0418  HGB 13.0 13.3  HCT 38.8* 41.0   BMET Recent Labs    09/19/20 0441 09/20/20 0418  NA 135 138  K 4.3 4.9  CL 104 105  CO2 25 25  GLUCOSE 185* 135*  BUN 28* 31*  CREATININE 1.41* 1.31*  CALCIUM 9.0 9.3     Studies/Results: CT CHEST ABDOMEN PELVIS W CONTRAST  Result Date: 09/18/2020 CLINICAL DATA:  recent umbilical hernia repair. eval SBO. Bloating, LLQ pain, emesis EXAM: CT CHEST, ABDOMEN, AND PELVIS WITH CONTRAST TECHNIQUE: Multidetector CT imaging of the chest, abdomen and pelvis was performed following the standard protocol during bolus administration of intravenous contrast. CONTRAST:  178m OMNIPAQUE IOHEXOL 350 MG/ML SOLN COMPARISON:  CT chest 03/25/2019 FINDINGS: CT CHEST FINDINGS Cardiovascular: The heart size is within normal limits. No pericardial effusion. Thoracic aorta is nonaneurysmal. Atherosclerotic calcifications of the aorta and coronary arteries. Central pulmonary vasculature appears within normal limits. Mediastinum/Nodes: There are a few mildly enlarged mediastinal lymph nodes including 11 mm right paratracheal node (series 2, image 12), unchanged and 11 mm precarinal node (series 2, image 17),  unchanged. 14 mm subcarinal node (series 2, image 24) is also unchanged. 10 mm right hilar node is unchanged. No axillary lymphadenopathy. Thyroid and trachea are within normal limits. Small amount of fluid within the esophagus, which appears otherwise unremarkable. Lungs/Pleura: Moderate centrilobular and paraseptal emphysematous changes bilaterally. Small bilateral pleural effusions with fluid tracking into the fissures. Diffuse bronchial wall thickening. Prominent biapical pleuroparenchymal scarring is unchanged in appearance compared to the prior including a more nodular configuration within the left apex. No new focal airspace consolidation. No pneumothorax. Musculoskeletal: No chest wall mass or suspicious bone lesions identified. CT ABDOMEN PELVIS FINDINGS Hepatobiliary: No focal liver abnormality is seen. No gallstones, gallbladder wall thickening, or biliary dilatation. Pancreas: Unremarkable. No pancreatic ductal dilatation or surrounding inflammatory changes. Spleen: Normal in size without focal abnormality. Adrenals/Urinary Tract: Unremarkable adrenal glands. Obstructing 8 x 8 mm stone within the proximal left ureter near the ureteropelvic junction resulting in moderate left-sided hydronephrosis. There are numerous additional stones within the left kidney, largest within the inferior pole measures up to 7 mm. Right kidney is within normal limits. No right renal stone or hydronephrosis. Urinary bladder is unremarkable for the degree of distension. Stomach/Bowel: Stomach is within normal limits. Appendix appears normal. Extensive diverticulosis within the sigmoid and distal descending colon. No evidence of bowel wall thickening, distention, or inflammatory changes. Vascular/Lymphatic: Aortoiliac atherosclerosis without aneurysm. No abdominopelvic lymphadenopathy. Reproductive: Prostate gland is enlarged with multiple coarse calcifications. Other: No ascites. No abdominopelvic fluid collections. No  pneumoperitoneum. Bilateral fat containing inguinal hernias. Musculoskeletal: Inferior endplate compression fracture of L4 appears chronic although is age indeterminate. Lower lumbar facet arthropathy. IMPRESSION: 1. Obstructing 8 x 8 mm stone  within the proximal left ureter near the ureteropelvic junction resulting in moderate left-sided hydronephrosis. 2. Small bilateral pleural effusions. 3. Unchanged mildly enlarged mediastinal and right hilar lymph nodes, likely reactive. 4. Colonic diverticulosis without evidence of acute diverticulitis. 5. Inferior endplate compression fracture of L4 appears chronic although is age indeterminate. 6. Findings of COPD with prominent biapical pleuroparenchymal scarring, similar in appearance to the previous CT. Aortic Atherosclerosis (ICD10-I70.0) and Emphysema (ICD10-J43.9). Electronically Signed   By: Davina Poke D.O.   On: 09/18/2020 15:16    Assessment/Plan: Left obstructing ureteral stone: CT A/P 09/18/2020 revealed a 8x90m left proximal ureteral stone with moderate left hydronephrosis. Persistent pain. AKI with creatinine 1.31. UA without sign of infection, urine cx pending Left hydronephrosis secondary to above AKI COPD excacerbation  -Given persistent pain, emesis, AKI, he elects for stent placement -Noted leukocytosis to 20 however in setting of steroid use for COPD exacerbation -Remains afebrile, urine cx pending -He is requesting ureteral stent placement. Will arrange f/u for definitive treatment of stone as outpatient.  -The risks, benefits and alternatives of cystoscopy with left JJ stent placement was discussed with the patient.  Risks include, but are not limited to: bleeding, urinary tract infection, ureteral injury, ureteral stricture disease, chronic pain, urinary symptoms, bladder injury, stent migration, the need for nephrostomy tube placement, MI, CVA, DVT, PE and the inherent risks with general anesthesia.  The patient voices understanding  and wishes to proceed.     LOS: 1 day   Matt R. Kelechi Astarita MD 09/20/2020, 12:50 PM [facility]  Pager: [phone]

## 2020-09-20 NOTE — Progress Notes (Signed)
Day of Surgery Subjective: C/o persistent left flank pain, controlled intermittently. Had nausea and emesis. Afebrile.  Objective: Vital signs in last 24 hours: Temp:  [97 F (36.1 C)-97.9 F (36.6 C)] 97 F (36.1 C) (09/11 1228) Pulse Rate:  [62-79] 64 (09/11 1126) Resp:  [16-18] 16 (09/11 1228) BP: (111-143)/(63-85) 122/69 (09/11 1228) SpO2:  [95 %-98 %] 98 % (09/11 1228)  Intake/Output from previous day: 09/10 0701 - 09/11 0700 In: 1817.7 [P.O.:480; I.V.:1287.7; IV Piggyback:50] Out: [id number]; Emesis/NG output:1] Intake/Output this shift: No intake/output data recorded.  Physical Exam:  General: Alert and oriented CV: RRR Lungs: Clear Abdomen: Soft, ND, NT Ext: NT, No erythema  Lab Results: Recent Labs    09/18/20 0929 09/20/20 0418  HGB 13.0 13.3  HCT 38.8* 41.0   BMET Recent Labs    09/19/20 0441 09/20/20 0418  NA 135 138  K 4.3 4.9  CL 104 105  CO2 25 25  GLUCOSE 185* 135*  BUN 28* 31*  CREATININE 1.41* 1.31*  CALCIUM 9.0 9.3     Studies/Results: CT CHEST ABDOMEN PELVIS W CONTRAST  Result Date: 09/18/2020 CLINICAL DATA:  recent umbilical hernia repair. eval SBO. Bloating, LLQ pain, emesis EXAM: CT CHEST, ABDOMEN, AND PELVIS WITH CONTRAST TECHNIQUE: Multidetector CT imaging of the chest, abdomen and pelvis was performed following the standard protocol during bolus administration of intravenous contrast. CONTRAST:  175m OMNIPAQUE IOHEXOL 350 MG/ML SOLN COMPARISON:  CT chest 03/25/2019 FINDINGS: CT CHEST FINDINGS Cardiovascular: The heart size is within normal limits. No pericardial effusion. Thoracic aorta is nonaneurysmal. Atherosclerotic calcifications of the aorta and coronary arteries. Central pulmonary vasculature appears within normal limits. Mediastinum/Nodes: There are a few mildly enlarged mediastinal lymph nodes including 11 mm right paratracheal node (series 2, image 12), unchanged and 11 mm precarinal node (series 2, image 17),  unchanged. 14 mm subcarinal node (series 2, image 24) is also unchanged. 10 mm right hilar node is unchanged. No axillary lymphadenopathy. Thyroid and trachea are within normal limits. Small amount of fluid within the esophagus, which appears otherwise unremarkable. Lungs/Pleura: Moderate centrilobular and paraseptal emphysematous changes bilaterally. Small bilateral pleural effusions with fluid tracking into the fissures. Diffuse bronchial wall thickening. Prominent biapical pleuroparenchymal scarring is unchanged in appearance compared to the prior including a more nodular configuration within the left apex. No new focal airspace consolidation. No pneumothorax. Musculoskeletal: No chest wall mass or suspicious bone lesions identified. CT ABDOMEN PELVIS FINDINGS Hepatobiliary: No focal liver abnormality is seen. No gallstones, gallbladder wall thickening, or biliary dilatation. Pancreas: Unremarkable. No pancreatic ductal dilatation or surrounding inflammatory changes. Spleen: Normal in size without focal abnormality. Adrenals/Urinary Tract: Unremarkable adrenal glands. Obstructing 8 x 8 mm stone within the proximal left ureter near the ureteropelvic junction resulting in moderate left-sided hydronephrosis. There are numerous additional stones within the left kidney, largest within the inferior pole measures up to 7 mm. Right kidney is within normal limits. No right renal stone or hydronephrosis. Urinary bladder is unremarkable for the degree of distension. Stomach/Bowel: Stomach is within normal limits. Appendix appears normal. Extensive diverticulosis within the sigmoid and distal descending colon. No evidence of bowel wall thickening, distention, or inflammatory changes. Vascular/Lymphatic: Aortoiliac atherosclerosis without aneurysm. No abdominopelvic lymphadenopathy. Reproductive: Prostate gland is enlarged with multiple coarse calcifications. Other: No ascites. No abdominopelvic fluid collections. No  pneumoperitoneum. Bilateral fat containing inguinal hernias. Musculoskeletal: Inferior endplate compression fracture of L4 appears chronic although is age indeterminate. Lower lumbar facet arthropathy. IMPRESSION: 1. Obstructing 8 x 8 mm stone  within the proximal left ureter near the ureteropelvic junction resulting in moderate left-sided hydronephrosis. 2. Small bilateral pleural effusions. 3. Unchanged mildly enlarged mediastinal and right hilar lymph nodes, likely reactive. 4. Colonic diverticulosis without evidence of acute diverticulitis. 5. Inferior endplate compression fracture of L4 appears chronic although is age indeterminate. 6. Findings of COPD with prominent biapical pleuroparenchymal scarring, similar in appearance to the previous CT. Aortic Atherosclerosis (ICD10-I70.0) and Emphysema (ICD10-J43.9). Electronically Signed   By: Davina Poke D.O.   On: 09/18/2020 15:16    Assessment/Plan: Left obstructing ureteral stone: CT A/P 09/18/2020 revealed a 8x76m left proximal ureteral stone with moderate left hydronephrosis. Persistent pain. AKI with creatinine 1.31. UA without sign of infection, urine cx pending Left hydronephrosis secondary to above AKI COPD excacerbation  -Given persistent pain, emesis, AKI, he elects for stent placement -Noted leukocytosis to 20 however in setting of steroid use for COPD exacerbation -Remains afebrile, urine cx pending -He is requesting ureteral stent placement. Will arrange f/u for definitive treatment of stone as outpatient.  -The risks, benefits and alternatives of cystoscopy with left JJ stent placement was discussed with the patient.  Risks include, but are not limited to: bleeding, urinary tract infection, ureteral injury, ureteral stricture disease, chronic pain, urinary symptoms, bladder injury, stent migration, the need for nephrostomy tube placement, MI, CVA, DVT, PE and the inherent risks with general anesthesia.  The patient voices understanding  and wishes to proceed.     LOS: 1 day   Matt R. Lori-Ann Lindfors MD 09/20/2020, 12:50 PM [facility]  Pager: [phone]

## 2020-09-21 ENCOUNTER — Inpatient Hospital Stay: Payer: Medicare HMO

## 2020-09-21 ENCOUNTER — Encounter: Payer: Self-pay | Admitting: Urology

## 2020-09-21 DIAGNOSIS — J441 Chronic obstructive pulmonary disease with (acute) exacerbation: Secondary | ICD-10-CM | POA: Diagnosis not present

## 2020-09-21 DIAGNOSIS — E440 Moderate protein-calorie malnutrition: Secondary | ICD-10-CM | POA: Insufficient documentation

## 2020-09-21 DIAGNOSIS — I480 Paroxysmal atrial fibrillation: Secondary | ICD-10-CM | POA: Diagnosis not present

## 2020-09-21 DIAGNOSIS — N179 Acute kidney failure, unspecified: Secondary | ICD-10-CM | POA: Diagnosis not present

## 2020-09-21 DIAGNOSIS — J9601 Acute respiratory failure with hypoxia: Secondary | ICD-10-CM | POA: Diagnosis not present

## 2020-09-21 LAB — CBC WITH DIFFERENTIAL/PLATELET
Abs Immature Granulocytes: 0.01 10*3/uL (ref 0.00–0.07)
Basophils Absolute: 0 10*3/uL (ref 0.0–0.1)
Basophils Relative: 0 %
Eosinophils Absolute: 0 10*3/uL (ref 0.0–0.5)
Eosinophils Relative: 0 %
HCT: 41.9 % (ref 39.0–52.0)
Hemoglobin: 13.8 g/dL (ref 13.0–17.0)
Immature Granulocytes: 0 %
Lymphocytes Relative: 7 %
Lymphs Abs: 0.5 10*3/uL — ABNORMAL LOW (ref 0.7–4.0)
MCH: 31.5 pg (ref 26.0–34.0)
MCHC: 32.9 g/dL (ref 30.0–36.0)
MCV: 95.7 fL (ref 80.0–100.0)
Monocytes Absolute: 0.4 10*3/uL (ref 0.1–1.0)
Monocytes Relative: 5 %
Neutro Abs: 6.7 10*3/uL (ref 1.7–7.7)
Neutrophils Relative %: 88 %
Platelets: 173 10*3/uL (ref 150–400)
RBC: 4.38 MIL/uL (ref 4.22–5.81)
RDW: 13.2 % (ref 11.5–15.5)
Smear Review: NORMAL
WBC Morphology: INCREASED
WBC: 7.6 10*3/uL (ref 4.0–10.5)
nRBC: 0 % (ref 0.0–0.2)

## 2020-09-21 LAB — BASIC METABOLIC PANEL
Anion gap: 10 (ref 5–15)
BUN: 36 mg/dL — ABNORMAL HIGH (ref 8–23)
CO2: 27 mmol/L (ref 22–32)
Calcium: 8.5 mg/dL — ABNORMAL LOW (ref 8.9–10.3)
Chloride: 104 mmol/L (ref 98–111)
Creatinine, Ser: 1.46 mg/dL — ABNORMAL HIGH (ref 0.61–1.24)
GFR, Estimated: 49 mL/min — ABNORMAL LOW (ref >60)
Glucose, Bld: 115 mg/dL — ABNORMAL HIGH (ref 70–99)
Potassium: 4.1 mmol/L (ref 3.5–5.1)
Sodium: 141 mmol/L (ref 135–145)

## 2020-09-21 LAB — MAGNESIUM: Magnesium: 1.8 mg/dL (ref 1.7–2.4)

## 2020-09-21 LAB — URINE CULTURE: Culture: NO GROWTH

## 2020-09-21 LAB — GLUCOSE, CAPILLARY: Glucose-Capillary: 153 mg/dL — ABNORMAL HIGH (ref 70–99)

## 2020-09-21 MED ORDER — SODIUM CHLORIDE 0.9 % IV SOLN: 250.0000 mL | INTRAVENOUS | Status: DC

## 2020-09-21 MED ORDER — NOREPINEPHRINE 4 MG/250ML-% IV SOLN
2.0000 ug/min | INTRAVENOUS | Status: DC
Start: 2020-09-21 — End: 2020-09-21

## 2020-09-21 MED ORDER — ENSURE ENLIVE PO LIQD
237.0000 mL | Freq: Three times a day (TID) | ORAL | Status: DC
  Administered 2020-09-21 – 2020-09-28 (×18): 237 mL via ORAL

## 2020-09-21 NOTE — Progress Notes (Signed)
Vital signs reviewed, ICU needs resolved  Will sign off at this time. No further recommendations at this time.  Please call [phone] for further questions. Thank you.    Tony Friscia David Izabela Ow, M.D.  [facility]  Medical Director ICU-[facility]  Medical Director [facility] Cardio-Pulmonary Department   

## 2020-09-21 NOTE — Progress Notes (Signed)
Initial Nutrition Assessment  DOCUMENTATION CODES:  Non-severe (moderate) malnutrition in context of chronic illness  INTERVENTION:  Adjust diet order to DYS 3 to aid in meal consumption Ensure Enlive po BID, each supplement provides 350 kcal and 20 grams of protein MVI with minerals daily Magic cup BID with meals, each supplement provides 290 kcal and 9 grams of protein  NUTRITION DIAGNOSIS:  Moderate Malnutrition (in the context of chronic illness) related to decreased appetite as evidenced by mild muscle depletion, moderate fat depletion, mild fat depletion, moderate muscle depletion.  GOAL:  Patient will meet greater than or equal to 90% of their needs  MONITOR:  PO intake, Supplement acceptance  REASON FOR ASSESSMENT:  Malnutrition Screening Tool    ASSESSMENT:  80 y.o. male with hx of atrial fibrillation CAD, COPD, GERD, HLD, HTN, and hx of TIA, presented to ED with left-sided abdominal pain and SOB. Imaging showed a left proximal ureter stone with left-sided hydronephrosis. Found to have an AKI and COPD exacerbation.  Urology consulted and initially pt opted for outpatient stenting. However, due to persistent pain underwent cystoscopy 9/11. Developed respiratory distress and hypoxia after procedure and was transferred to ICU. Discussed in rounds, stable today and will likely be transferred out of ICU when bed available.  Pt resting in bed at the time of assessment. Lunch tray at bedside, ~30% consumed. Pt reports that he has had a decreased appetite for quite sometime and that this is about his baseline. Pt does endorse some trouble chewing. Wears dentures at baseline, but they are not present here at the hospital. Will modify diet order to DYS 3 so that soft foods are sent to reduce the amount of chewing pt has to do.   6.3% weight loss noted in the last 6 months. Not severe for time frame but concerning due to pt's age and current BMI being less than desirable (<23 undesirable  for >65 years) for advanced age. Pt endorses weight loss over the last several month from his baseline of about 184 lb.   Discussed nutrition supplements, pt reports that he does drink them a few times a week at home, likes all flavors.   Average Meal Intake: 9/10-9/12: 100% intake x 2 recorded meals  Nutritionally Relevant Medications: Scheduled Meds:  atorvastatin  40 mg Oral QPC supper   ferrous gluconate  324 mg Oral Q lunch   furosemide  40 mg Intravenous Daily   methylPREDNISolone   40 mg Intravenous Daily   pantoprazole  40 mg Oral QPC supper   PRN Meds: ondansetron, promethazine  Labs Reviewed: BUN 36, creatinine 1.46  NUTRITION - FOCUSED PHYSICAL EXAM: Flowsheet Row Most Recent Value  Orbital Region Moderate depletion  Upper Arm Region Mild depletion  Thoracic and Lumbar Region Moderate depletion  Buccal Region Mild depletion  Temple Region Mild depletion  Clavicle Bone Region Mild depletion  Clavicle and Acromion Bone Region Moderate depletion  Scapular Bone Region Mild depletion  Dorsal Hand Mild depletion  Patellar Region Moderate depletion  Anterior Thigh Region Moderate depletion  Posterior Calf Region Moderate depletion  Edema (RD Assessment) None  Hair Reviewed  Eyes Reviewed  Mouth Reviewed  Skin Reviewed  Nails Reviewed   Diet Order:   Diet Order             DIET DYS 3 Room service appropriate? Yes; Fluid consistency: Thin  Diet effective now                   EDUCATION  NEEDS:  No education needs have been identified at this time  Skin:  Skin Assessment: Reviewed RN Assessment (ecchymosis to bilateral arms)  Last BM:  9/11  Height:  Ht Readings from Last 1 Encounters:  09/18/20 '6\' 2"'$  (1.88 m)    Weight:  Wt Readings from Last 1 Encounters:  09/18/20 77.4 kg    Ideal Body Weight:  86.4 kg  BMI:  Body mass index is 21.91 kg/m.  Estimated Nutritional Needs:  Kcal:  2000-2200 kcal/d Protein:  100-115 g/d Fluid:  2.2-2.4  L/d   Ranell Patrick, RD, LDN Clinical Dietitian Pager on [place]

## 2020-09-21 NOTE — Progress Notes (Signed)
Pt remains in controlled rate AFIB. Pt transitioned to 4L [place] this AM. Tolerating well.   Foley remains in place. Diuresed w/ lasix. 1.8L out. Tea colored urine w/ some clots present.   Pt does not endorse pain. Pt started on dysphagia diet. Eating well.   Pt in no acute distress @ this time. Will continue to monitor.

## 2020-09-21 NOTE — Anesthesia Postprocedure Evaluation (Signed)
Anesthesia Post Note  Patient: [name] Browning  Procedure(s) Performed: CYSTOSCOPY WITH RETROGRADE PYELOGRAM/URETERAL STENT PLACEMENT (Left: Ureter)  Patient location during evaluation: ICU Anesthesia Type: General and MAC Level of consciousness: awake and alert and oriented Pain management: pain level controlled Vital Signs Assessment: post-procedure vital signs reviewed and stable Respiratory status: patient connected to nasal cannula oxygen (High flow [place]) Cardiovascular status: blood pressure returned to baseline Anesthetic complications: no   No notable events documented.   Last Vitals:  Vitals:   09/21/20 0730 09/21/20 0800  BP: (!) 82/45 (!) 91/54  Pulse: 66 81  Resp: 19 (!) 25  Temp:    SpO2: 99% 96%    Last Pain:  Vitals:   09/21/20 0800  TempSrc:   PainSc: 0-No pain                 Neilan Rizzo Lily Peer

## 2020-09-21 NOTE — Progress Notes (Signed)
PROGRESS NOTE    [name] Browning  [id number] DOB: 1940-03-13 DOA: 09/18/2020 PCP: [place] [place]   Chief complaint.  Shortness of breath. Brief Narrative:  [name] Browning is a 80 y.o. male with medical history significant for COPD, paroxysmal atrial fibrillation on Eliquis, TIA, hyperlipidemia, anxiety and recent umbilical hernia repair who presents with concerns of increasing shortness of breath and left-sided abdominal pain.  Patient is started on steroids for COPD exacerbation.  CT scan also showed left kidney stone with hydronephrosis.  Urology placed left ureteral stent on 9/11. Post procedure he was tachypneic, hypoxic and hypotensive requiring ICU/SD and BiPAP   Assessment & Plan:   Active Problems:   HTN (hypertension)   Hyperlipidemia   COPD exacerbation (HCC)   Acute respiratory failure with hypoxia (HCC)   AF (paroxysmal atrial fibrillation) (HCC)   Urinary tract obstruction by kidney stone   AKI (acute kidney injury) ([place])   Malnutrition of moderate degree  Acute hypoxemic respiratory failure secondary to COPD exacerbation. COPD exacerbation. Post op atelectasis with interstitial edema requiring BiPAP and diuresis with lasix  Patient is still requiring 4 L oxygen via nasal cannula. Continue Lasix 40 mg IV daily and Solu-Medrol 40 mg IV daily Net IO Since Admission: -3,137.3 mL [09/21/20 2144]  Procalcitonin less than 0.1 so antibiotics stopped.  Was given dose of Unasyn for possible aspiration.  Chest x-ray from today shows no acute pathology   Obstructing left proximal ureter stone with left-sided hydronephrosis Acute kidney injury secondary to obstruction.  CKD stage II S/p left ureteral stent by urology on 9/11 Kidney function back to baseline now Urine culture had no growth.  Antibiotics stopped  Paroxysmal atrial fibrillation. Resume Eliquis tomorrow as he reported some hemoptysis today.  Rate is controlled  Hypotension with a history of essential  hypertension. Holding losartan and amlodipine due to hypotension  Constipation.  Resolved with bowel regimen   DVT prophylaxis: SCDs, coagulation on hold due to mild hemoptysis and hypotension. Code Status: full Family Communication: None today Disposition Plan:    Status is: Inpatient  Remains inpatient appropriate because:Ongoing diagnostic testing needed not appropriate for outpatient work up, IV treatments appropriate due to intensity of illness or inability to take PO, and Inpatient level of care appropriate due to severity of illness  Dispo: The patient is from: Home              Anticipated d/c is to: Home              Patient currently is not medically stable to d/c.   Difficult to place patient No    I/O last 3 completed shifts: In: [id number] [P.O.:240; I.V.:826.1; IV Piggyback:379.9] Out: 4225 [Urine:4225] No intake/output data recorded.     Consultants:  Urology PCCM  Procedures: Cystoscopy with left ureteral stent placement on 09/20/2020  Antimicrobials: none.  He had received dose of Unasyn and also Rocephin which are stopped now                             Subjective: Reported mild hemoptysis with ongoing cough.  Remains hypotensive and tachypneic.  Still requiring 4 L oxygen via nasal cannula.  Objective: Vitals:   09/21/20 1830 09/21/20 1900 09/21/20 2000 09/21/20 2100  BP: (!) 90/48 (!) 107/53 112/64 (!) 94/58  Pulse: 93 86 78 94  Resp: 19 19 (!) 26 (!) 23  Temp:   98.4 F (36.9 C)   TempSrc:  Oral   SpO2: 95% 94% 94% 94%  Weight:      Height:        Intake/Output Summary (Last 24 hours) at 09/21/2020 2133 Last data filed at 09/21/2020 1700 Gross per 24 hour  Intake 519.88 ml  Output 3100 ml  Net -2580.12 ml   Filed Weights   09/18/20 0926 09/18/20 2023  Weight: 80.7 kg 77.4 kg    Examination:  General exam: Appears calm and comfortable  Respiratory system: Decreased breathing sounds without crackles or wheezes. Respiratory effort normal.   Rales at the bases Cardiovascular system: S1 & S2 heard, RRR. No JVD, murmurs, rubs, gallops or clicks. No pedal edema. Gastrointestinal system: Abdomen is nondistended, soft and nontender. No organomegaly or masses felt. Normal bowel sounds heard. Central nervous system: Alert and oriented. No focal neurological deficits. Extremities: Symmetric 5 x 5 power. Skin: No rashes, lesions or ulcers Psychiatry: Judgement and insight appear normal. Mood & affect appropriate.     Data Reviewed: I have personally reviewed following labs and imaging studies  CBC: Recent Labs  Lab 09/18/20 0929 09/20/20 0418 09/21/20 0428  WBC 7.7 20.7* 7.6  NEUTROABS  --  18.6* 6.7  HGB 13.0 13.3 13.8  HCT 38.8* 41.0 41.9  MCV 93.0 94.0 95.7  PLT 179 191 [id number]   Basic Metabolic Panel: Recent Labs  Lab 09/18/20 0929 09/19/20 0441 09/20/20 0418 09/21/20 0428  NA 138 135 138 141  K 3.9 4.3 4.9 4.1  CL 107 104 105 104  CO2 '23 25 25 27  '$ GLUCOSE 118* 185* 135* 115*  BUN 19 28* 31* 36*  CREATININE 1.09 1.41* 1.31* 1.46*  CALCIUM 9.6 9.0 9.3 8.5*  MG  --   --  2.3 1.8   GFR: Estimated Creatinine Clearance: 44.9 mL/min (A) (by C-G formula based on SCr of 1.46 mg/dL (H)). Liver Function Tests: Recent Labs  Lab 09/18/20 0929  AST 26  ALT 21  ALKPHOS 143*  BILITOT 1.2  PROT 7.3  ALBUMIN 3.8   Recent Labs  Lab 09/18/20 0929  LIPASE 25   No results for input(s): AMMONIA in the last 168 hours. Coagulation Profile: No results for input(s): INR, PROTIME in the last 168 hours. Cardiac Enzymes: No results for input(s): CKTOTAL, CKMB, CKMBINDEX, TROPONINI in the last 168 hours. BNP (last 3 results) No results for input(s): PROBNP in the last 8760 hours. HbA1C: No results for input(s): HGBA1C in the last 72 hours. CBG: Recent Labs  Lab 09/20/20 1654  GLUCAP 153*   Lipid Profile: No results for input(s): CHOL, HDL, LDLCALC, TRIG, CHOLHDL, LDLDIRECT in the last 72 hours. Thyroid Function  Tests: No results for input(s): TSH, T4TOTAL, FREET4, T3FREE, THYROIDAB in the last 72 hours. Anemia Panel: No results for input(s): VITAMINB12, FOLATE, FERRITIN, TIBC, IRON, RETICCTPCT in the last 72 hours. Sepsis Labs: Recent Labs  Lab 09/20/20 1806  PROCALCITON <0.10    Recent Results (from the past 240 hour(s))  Resp Panel by RT-PCR (Flu A&B, Covid) Nasopharyngeal Swab     Status: None   Collection Time: 09/18/20 12:34 PM   Specimen: Nasopharyngeal Swab; Nasopharyngeal(NP) swabs in vial transport medium  Result Value Ref Range Status   SARS Coronavirus 2 by RT PCR NEGATIVE NEGATIVE Final    Comment: (NOTE) SARS-CoV-2 target nucleic acids are NOT DETECTED.  The SARS-CoV-2 RNA is generally detectable in upper respiratory specimens during the acute phase of infection. The lowest concentration of SARS-CoV-2 viral copies this assay can detect is 138 copies/mL.  A negative result does not preclude SARS-Cov-2 infection and should not be used as the sole basis for treatment or other patient management decisions. A negative result may occur with  improper specimen collection/handling, submission of specimen other than nasopharyngeal swab, presence of viral mutation(s) within the areas targeted by this assay, and inadequate number of viral copies(<138 copies/mL). A negative result must be combined with clinical observations, patient history, and epidemiological information. The expected result is Negative.  Fact Sheet for Patients:  EntrepreneurPulse.com.au  Fact Sheet for Healthcare Providers:  IncredibleEmployment.be  This test is no t yet approved or cleared by the [place] FDA and  has been authorized for detection and/or diagnosis of SARS-CoV-2 by FDA under an Emergency Use Authorization (EUA). This EUA will remain  in effect (meaning this test can be used) for the duration of the COVID-19 declaration under Section 564(b)(1) of the Act,  21 U.S.C.section 360bbb-3(b)(1), unless the authorization is terminated  or revoked sooner.       Influenza A by PCR NEGATIVE NEGATIVE Final   Influenza B by PCR NEGATIVE NEGATIVE Final    Comment: (NOTE) The Xpert Xpress SARS-CoV-2/FLU/RSV plus assay is intended as an aid in the diagnosis of influenza from Nasopharyngeal swab specimens and should not be used as a sole basis for treatment. Nasal washings and aspirates are unacceptable for Xpert Xpress SARS-CoV-2/FLU/RSV testing.  Fact Sheet for Patients: EntrepreneurPulse.com.au  Fact Sheet for Healthcare Providers: IncredibleEmployment.be  This test is not yet approved or cleared by the [place] FDA and has been authorized for detection and/or diagnosis of SARS-CoV-2 by FDA under an Emergency Use Authorization (EUA). This EUA will remain in effect (meaning this test can be used) for the duration of the COVID-19 declaration under Section 564(b)(1) of the Act, 21 U.S.C. section 360bbb-3(b)(1), unless the authorization is terminated or revoked.  Performed at [facility], [address] [place], [place] [address]   Urine Culture     Status: None   Collection Time: 09/20/20  1:34 PM   Specimen: Urine, Random  Result Value Ref Range Status   Specimen Description   Final    URINE, RANDOM Performed at [facility], [address] [place], [place] [address]    Special Requests   Final    NONE Performed at [facility], [address], [place], [place] [address]    Culture   Final    NO GROWTH Performed at [facility], [place] [address], [place], [place] [address]    Report Status 09/21/2020 FINAL  Final         Radiology Studies: DG Chest Port 1 View  Result Date: 09/21/2020 CLINICAL DATA:  Acute respiratory failure EXAM: PORTABLE CHEST 1 VIEW COMPARISON:  09/20/2020 FINDINGS: Cardiac shadow is stable. Persistent interstitial and  ground-glass opacities are noted throughout both lungs relatively stable from the prior exam. Metallic foreign body is noted in the anterior left chest wall stable from previous exams. No bony abnormality is noted. IMPRESSION: Stable airspace opacities bilaterally. No new focal abnormality is noted. Electronically Signed   By: [name] M.D.   On: 09/21/2020 09:03   DG Chest Port 1 View  Result Date: 09/20/2020 CLINICAL DATA:  Pulmonary edema EXAM: PORTABLE CHEST 1 VIEW COMPARISON:  09/20/2020 FINDINGS: Single frontal view of the chest demonstrates stable enlargement of the cardiac silhouette. There are worsening interstitial and ground-glass opacities throughout the lungs consistent with worsening volume status and progressive edema. No effusion or pneumothorax.  No acute bony abnormalities. IMPRESSION: 1. Progressive interstitial and ground-glass opacities throughout the lungs, compatible with worsening volume status and progressive edema given rapid progression. Electronically Signed   By: Randa Ngo M.D.   On: 09/20/2020 18:26   DG Chest Port 1 View  Result Date: 09/20/2020 CLINICAL DATA:  Increased cough and shortness of breath. EXAM: PORTABLE CHEST 1 VIEW COMPARISON:  September 18, 2020 FINDINGS: Cardiomediastinal silhouette is normal. Mediastinal contours appear intact. Diffuse bilateral interstitial opacities and coarsening of the interstitium. Osseous structures are without acute abnormality. Soft tissues are grossly normal. IMPRESSION: Diffuse bilateral interstitial opacities and coarsening of the interstitium, not significantly changed. Electronically Signed   By: Fidela Salisbury M.D.   On: 09/20/2020 16:36   DG OR UROLOGY CYSTO IMAGE ([place])  Result Date: 09/20/2020 There is no interpretation for this exam.  This order is for images obtained during a surgical procedure.  Please See "Surgeries" Tab for more information regarding the procedure.        Scheduled Meds:   aspirin EC  81 mg Oral QPC supper   atorvastatin  40 mg Oral QPC supper   Chlorhexidine Gluconate Cloth  6 each Topical Daily   feeding supplement  237 mL Oral TID BM   ferrous gluconate  324 mg Oral Q lunch   furosemide  40 mg Intravenous Daily   mouth rinse  15 mL Mouth Rinse BID   methylPREDNISolone (SOLU-MEDROL) injection  40 mg Intravenous Daily   montelukast  10 mg Oral QHS   pantoprazole  40 mg Oral QPC supper   Continuous Infusions:  sodium chloride     promethazine (PHENERGAN) injection (IM or IVPB) Stopped (09/20/20 0519)     LOS: 2 days    Time spent: 32 minutes    Toyia Jelinek Manuella Ghazi, MD [facility]   To contact the attending provider between 7A-7P or the covering provider during after hours 7P-7A, please log into the web site [email or web address] and access using universal [place] password for that web site. If you do not have the password, please call the hospital operator.  09/21/2020, 9:33 PM

## 2020-09-22 DIAGNOSIS — I480 Paroxysmal atrial fibrillation: Secondary | ICD-10-CM | POA: Diagnosis not present

## 2020-09-22 DIAGNOSIS — N179 Acute kidney failure, unspecified: Secondary | ICD-10-CM | POA: Diagnosis not present

## 2020-09-22 DIAGNOSIS — J9601 Acute respiratory failure with hypoxia: Secondary | ICD-10-CM | POA: Diagnosis not present

## 2020-09-22 DIAGNOSIS — J441 Chronic obstructive pulmonary disease with (acute) exacerbation: Secondary | ICD-10-CM | POA: Diagnosis not present

## 2020-09-22 LAB — BASIC METABOLIC PANEL
Anion gap: 7 (ref 5–15)
BUN: 50 mg/dL — ABNORMAL HIGH (ref 8–23)
CO2: 31 mmol/L (ref 22–32)
Calcium: 8.8 mg/dL — ABNORMAL LOW (ref 8.9–10.3)
Chloride: 100 mmol/L (ref 98–111)
Creatinine, Ser: 1.41 mg/dL — ABNORMAL HIGH (ref 0.61–1.24)
GFR, Estimated: 51 mL/min — ABNORMAL LOW (ref >60)
Glucose, Bld: 133 mg/dL — ABNORMAL HIGH (ref 70–99)
Potassium: 3.9 mmol/L (ref 3.5–5.1)
Sodium: 138 mmol/L (ref 135–145)

## 2020-09-22 LAB — CBC
HCT: 39.5 % (ref 39.0–52.0)
Hemoglobin: 12.6 g/dL — ABNORMAL LOW (ref 13.0–17.0)
MCH: 29.9 pg (ref 26.0–34.0)
MCHC: 31.9 g/dL (ref 30.0–36.0)
MCV: 93.6 fL (ref 80.0–100.0)
Platelets: 148 10*3/uL — ABNORMAL LOW (ref 150–400)
RBC: 4.22 MIL/uL (ref 4.22–5.81)
RDW: 12.9 % (ref 11.5–15.5)
WBC: 9.4 10*3/uL (ref 4.0–10.5)
nRBC: 0 % (ref 0.0–0.2)

## 2020-09-22 MED ORDER — MORPHINE SULFATE (PF) 2 MG/ML IV SOLN
2.0000 mg | INTRAVENOUS | Status: DC | PRN
Start: 2020-09-22 — End: 2020-09-28
  Administered 2020-09-22 – 2020-09-26 (×7): 2 mg via INTRAVENOUS
  Filled 2020-09-22 (×7): qty 1

## 2020-09-22 MED ORDER — SENNOSIDES-DOCUSATE SODIUM 8.6-50 MG PO TABS
2.0000 | ORAL_TABLET | Freq: Two times a day (BID) | ORAL | Status: DC
  Administered 2020-09-22 – 2020-09-28 (×12): 2 via ORAL
  Filled 2020-09-22 (×13): qty 2

## 2020-09-22 MED ORDER — POLYETHYLENE GLYCOL 3350 17 G PO PACK
17.0000 g | PACK | Freq: Every day | ORAL | Status: DC
  Administered 2020-09-22 – 2020-09-27 (×4): 17 g via ORAL
  Filled 2020-09-22 (×6): qty 1

## 2020-09-22 MED ORDER — TRAMADOL HCL 50 MG PO TABS
50.0000 mg | ORAL_TABLET | Freq: Four times a day (QID) | ORAL | Status: DC | PRN
Start: 2020-09-22 — End: 2020-09-28
  Administered 2020-09-22 – 2020-09-27 (×5): 50 mg via ORAL
  Filled 2020-09-22 (×5): qty 1

## 2020-09-22 MED ORDER — APIXABAN 5 MG PO TABS
5.0000 mg | ORAL_TABLET | Freq: Two times a day (BID) | ORAL | Status: DC
  Administered 2020-09-22 – 2020-09-28 (×12): 5 mg via ORAL
  Filled 2020-09-22: qty 1
  Filled 2020-09-22 (×2): qty 2
  Filled 2020-09-22: qty 1
  Filled 2020-09-22 (×2): qty 2
  Filled 2020-09-22: qty 1
  Filled 2020-09-22: qty 2
  Filled 2020-09-22 (×2): qty 1
  Filled 2020-09-22: qty 2
  Filled 2020-09-22: qty 1

## 2020-09-22 NOTE — Progress Notes (Addendum)
PROGRESS NOTE    [name]  [id number] DOB: Jan 25, 1940 DOA: 09/18/2020 PCP: [place]   Chief complaint.  Shortness of breath. Brief Narrative:  [name] Browning is a 80 y.o. male with medical history significant for COPD, paroxysmal atrial fibrillation on Eliquis, TIA, hyperlipidemia, anxiety and recent umbilical hernia repair who presents with concerns of increasing shortness of breath and left-sided abdominal pain.  Patient is started on steroids for COPD exacerbation.  CT scan also showed left kidney stone with hydronephrosis.  Urology placed left ureteral stent on 9/11. Post procedure he was tachypneic, hypoxic and hypotensive requiring ICU/SD and BiPAP  9/14 - transfer out to med-surg. Continues weaning o2    Assessment & Plan:   Active Problems:   HTN (hypertension)   Hyperlipidemia   COPD exacerbation (HCC)   Acute respiratory failure with hypoxia (HCC)   AF (paroxysmal atrial fibrillation) (HCC)   Urinary tract obstruction by kidney stone   AKI (acute kidney injury) ([place])   Malnutrition of moderate degree  Acute hypoxemic respiratory failure secondary to COPD exacerbation. COPD exacerbation. Post op atelectasis with interstitial edema requiring BiPAP and diuresis with lasix  Patient is still requiring 4 L oxygen via nasal cannula. Continue Lasix 40 mg IV daily and Solu-Medrol 40 mg IV daily Net IO Since Admission: -3,957.3 mL [09/22/20 1528]  Procalcitonin less than 0.1 so antibiotics stopped.     Obstructing left proximal ureter stone with left-sided hydronephrosis Acute kidney injury secondary to obstruction.  CKD stage II S/p left ureteral stent by urology on 9/11 Kidney function back to baseline now Urine culture had no growth.  Antibiotics stopped  Paroxysmal atrial fibrillation. Resume Eliquis. Rate is controlled  Hypotension with a history of essential hypertension. Holding losartan and amlodipine due to hypotension.  I have held morphine and  other oral narcotics.  Constipation.  Start bowel regimen.  Avoid narcotics   DVT prophylaxis: On Eliquis Code Status: full Family Communication: Updated patient's daughter/Gail today Disposition Plan: Home   Status is: Inpatient  Remains inpatient appropriate because:Ongoing diagnostic testing needed not appropriate for outpatient work up, IV treatments appropriate due to intensity of illness or inability to take PO, and Inpatient level of care appropriate due to severity of illness  Dispo: The patient is from: Home              Anticipated d/c is to: Home              Patient currently is not medically stable to d/c.   Difficult to place patient No    I/O last 3 completed shifts: In: 519.9 [P.O.:240; IV Piggyback:279.9] Out: [id number] [Urine:4150] Total I/O In: 480 [P.O.:480] Out: 46 [Urine:650]     Consultants:  Urology PCCM  Procedures: Cystoscopy with left ureteral stent placement on 09/20/2020  Antimicrobials: none.  He had received dose of Unasyn and also Rocephin which are stopped now                             Subjective: Feels constipated.  Still requiring 4 L oxygen and was hypotensive.  Not having much pain and agreeable to stop narcotics  Objective: Vitals:   09/22/20 1200 09/22/20 1300 09/22/20 1500 09/22/20 1525  BP: 107/65 119/66 115/69   Pulse: 70 87 92   Resp: 19 (!) 21 (!) 25   Temp: 98.2 F (36.8 C)   97.9 F (36.6 C)  TempSrc: Oral   Oral  SpO2: 96% 95% 94%   Weight:      Height:        Intake/Output Summary (Last 24 hours) at 09/22/2020 1528 Last data filed at 09/22/2020 1500 Gross per 24 hour  Intake 480 ml  Output 2150 ml  Net -1670 ml   Filed Weights   09/18/20 0926 09/18/20 2023  Weight: 80.7 kg 77.4 kg    Examination:  General exam: Appears calm and comfortable  Respiratory system: Decreased breathing sounds without crackles or wheezes. Respiratory effort normal.  Rales at the bases Cardiovascular system: S1 & S2 heard, RRR.  No JVD, murmurs, rubs, gallops or clicks. No pedal edema. Gastrointestinal system: Abdomen is nondistended, soft and nontender. No organomegaly or masses felt. Normal bowel sounds heard. Central nervous system: Alert and oriented. No focal neurological deficits. Extremities: Symmetric 5 x 5 power. Skin: No rashes, lesions or ulcers Psychiatry: Judgement and insight appear normal. Mood & affect appropriate.     Data Reviewed: I have personally reviewed following labs and imaging studies  CBC: Recent Labs  Lab 09/18/20 0929 09/20/20 0418 09/21/20 0428 09/22/20 0347  WBC 7.7 20.7* 7.6 9.4  NEUTROABS  --  18.6* 6.7  --   HGB 13.0 13.3 13.8 12.6*  HCT 38.8* 41.0 41.9 39.5  MCV 93.0 94.0 95.7 93.6  PLT 179 191 173 [id number]*   Basic Metabolic Panel: Recent Labs  Lab 09/18/20 0929 09/19/20 0441 09/20/20 0418 09/21/20 0428 09/22/20 0347  NA 138 135 138 141 138  K 3.9 4.3 4.9 4.1 3.9  CL 107 104 105 104 100  CO2 '23 25 25 27 31  '$ GLUCOSE 118* 185* 135* 115* 133*  BUN 19 28* 31* 36* 50*  CREATININE 1.09 1.41* 1.31* 1.46* 1.41*  CALCIUM 9.6 9.0 9.3 8.5* 8.8*  MG  --   --  2.3 1.8  --    GFR: Estimated Creatinine Clearance: 46.5 mL/min (A) (by C-G formula based on SCr of 1.41 mg/dL (H)). Liver Function Tests: Recent Labs  Lab 09/18/20 0929  AST 26  ALT 21  ALKPHOS 143*  BILITOT 1.2  PROT 7.3  ALBUMIN 3.8   Recent Labs  Lab 09/18/20 0929  LIPASE 25   No results for input(s): AMMONIA in the last 168 hours. Coagulation Profile: No results for input(s): INR, PROTIME in the last 168 hours. Cardiac Enzymes: No results for input(s): CKTOTAL, CKMB, CKMBINDEX, TROPONINI in the last 168 hours. BNP (last 3 results) No results for input(s): PROBNP in the last 8760 hours. HbA1C: No results for input(s): HGBA1C in the last 72 hours. CBG: Recent Labs  Lab 09/20/20 1654  GLUCAP 153*   Lipid Profile: No results for input(s): CHOL, HDL, LDLCALC, TRIG, CHOLHDL, LDLDIRECT in the  last 72 hours. Thyroid Function Tests: No results for input(s): TSH, T4TOTAL, FREET4, T3FREE, THYROIDAB in the last 72 hours. Anemia Panel: No results for input(s): VITAMINB12, FOLATE, FERRITIN, TIBC, IRON, RETICCTPCT in the last 72 hours. Sepsis Labs: Recent Labs  Lab 09/20/20 1806  PROCALCITON <0.10    Recent Results (from the past 240 hour(s))  Resp Panel by RT-PCR (Flu A&B, Covid) Nasopharyngeal Swab     Status: None   Collection Time: 09/18/20 12:34 PM   Specimen: Nasopharyngeal Swab; Nasopharyngeal(NP) swabs in vial transport medium  Result Value Ref Range Status   SARS Coronavirus 2 by RT PCR NEGATIVE NEGATIVE Final    Comment: (NOTE) SARS-CoV-2 target nucleic acids are NOT DETECTED.  The SARS-CoV-2 RNA is generally detectable in upper respiratory specimens  during the acute phase of infection. The lowest concentration of SARS-CoV-2 viral copies this assay can detect is 138 copies/mL. A negative result does not preclude SARS-Cov-2 infection and should not be used as the sole basis for treatment or other patient management decisions. A negative result may occur with  improper specimen collection/handling, submission of specimen other than nasopharyngeal swab, presence of viral mutation(s) within the areas targeted by this assay, and inadequate number of viral copies(<138 copies/mL). A negative result must be combined with clinical observations, patient history, and epidemiological information. The expected result is Negative.  Fact Sheet for Patients:  EntrepreneurPulse.com.au  Fact Sheet for Healthcare Providers:  IncredibleEmployment.be  This test is no t yet approved or cleared by the [place] FDA and  has been authorized for detection and/or diagnosis of SARS-CoV-2 by FDA under an Emergency Use Authorization (EUA). This EUA will remain  in effect (meaning this test can be used) for the duration of the COVID-19 declaration  under Section 564(b)(1) of the Act, 21 U.S.C.section 360bbb-3(b)(1), unless the authorization is terminated  or revoked sooner.       Influenza A by PCR NEGATIVE NEGATIVE Final   Influenza B by PCR NEGATIVE NEGATIVE Final    Comment: (NOTE) The Xpert Xpress SARS-CoV-2/FLU/RSV plus assay is intended as an aid in the diagnosis of influenza from Nasopharyngeal swab specimens and should not be used as a sole basis for treatment. Nasal washings and aspirates are unacceptable for Xpert Xpress SARS-CoV-2/FLU/RSV testing.  Fact Sheet for Patients: EntrepreneurPulse.com.au  Fact Sheet for Healthcare Providers: IncredibleEmployment.be  This test is not yet approved or cleared by the [place] FDA and has been authorized for detection and/or diagnosis of SARS-CoV-2 by FDA under an Emergency Use Authorization (EUA). This EUA will remain in effect (meaning this test can be used) for the duration of the COVID-19 declaration under Section 564(b)(1) of the Act, 21 U.S.C. section 360bbb-3(b)(1), unless the authorization is terminated or revoked.  Performed at [facility], [address] [place], [place] [address]   Urine Culture     Status: None   Collection Time: 09/20/20  1:34 PM   Specimen: Urine, Random  Result Value Ref Range Status   Specimen Description   Final    URINE, RANDOM Performed at [facility], [address] [place], [place] [address]    Special Requests   Final    NONE Performed at [facility], [address], [place], [place] [address]    Culture   Final    NO GROWTH Performed at [facility], [place] [address] [place], [place] [address]    Report Status 09/21/2020 FINAL  Final         Radiology Studies: DG Chest Port 1 View  Result Date: 09/21/2020 CLINICAL DATA:  Acute respiratory failure EXAM: PORTABLE CHEST 1 VIEW COMPARISON:  09/20/2020 FINDINGS: Cardiac shadow is stable.  Persistent interstitial and ground-glass opacities are noted throughout both lungs relatively stable from the prior exam. Metallic foreign body is noted in the anterior left chest wall stable from previous exams. No bony abnormality is noted. IMPRESSION: Stable airspace opacities bilaterally. No new focal abnormality is noted. Electronically Signed   By: [name] M.D.   On: 09/21/2020 09:03   DG Chest Port 1 View  Result Date: 09/20/2020 CLINICAL DATA:  Pulmonary edema EXAM: PORTABLE CHEST 1 VIEW COMPARISON:  09/20/2020 FINDINGS: Single frontal view of the chest demonstrates stable enlargement of the cardiac silhouette. There are  worsening interstitial and ground-glass opacities throughout the lungs consistent with worsening volume status and progressive edema. No effusion or pneumothorax. No acute bony abnormalities. IMPRESSION: 1. Progressive interstitial and ground-glass opacities throughout the lungs, compatible with worsening volume status and progressive edema given rapid progression. Electronically Signed   By: Randa Ngo M.D.   On: 09/20/2020 18:26        Scheduled Meds:  apixaban  5 mg Oral BID   aspirin EC  81 mg Oral QPC supper   atorvastatin  40 mg Oral QPC supper   Chlorhexidine Gluconate Cloth  6 each Topical Daily   feeding supplement  237 mL Oral TID BM   ferrous gluconate  324 mg Oral Q lunch   mouth rinse  15 mL Mouth Rinse BID   methylPREDNISolone (SOLU-MEDROL) injection  40 mg Intravenous Daily   montelukast  10 mg Oral QHS   pantoprazole  40 mg Oral QPC supper   polyethylene glycol  17 g Oral Daily   senna-docusate  2 tablet Oral BID   Continuous Infusions:  sodium chloride     promethazine (PHENERGAN) injection (IM or IVPB) Stopped (09/20/20 0519)     LOS: 3 days    Time spent: 32 minutes    Tomia Enlow Manuella Ghazi, MD [facility]   To contact the attending provider between 7A-7P or the covering provider during after hours 7P-7A, please log into the web  site [email or web address] and access using universal [place] password for that web site. If you do not have the password, please call the hospital operator.  09/22/2020, 3:28 PM

## 2020-09-22 NOTE — Progress Notes (Signed)
Pt transferred to 1A. VSS stable. Maintained on 4L O2 via [place] for now. Son and daughter informed of transfer to [facility]

## 2020-09-23 DIAGNOSIS — J9601 Acute respiratory failure with hypoxia: Secondary | ICD-10-CM | POA: Diagnosis not present

## 2020-09-23 DIAGNOSIS — J441 Chronic obstructive pulmonary disease with (acute) exacerbation: Secondary | ICD-10-CM | POA: Diagnosis not present

## 2020-09-23 LAB — CBC
HCT: 37.1 % — ABNORMAL LOW (ref 39.0–52.0)
Hemoglobin: 12 g/dL — ABNORMAL LOW (ref 13.0–17.0)
MCH: 29.9 pg (ref 26.0–34.0)
MCHC: 32.3 g/dL (ref 30.0–36.0)
MCV: 92.5 fL (ref 80.0–100.0)
Platelets: 172 10*3/uL (ref 150–400)
RBC: 4.01 MIL/uL — ABNORMAL LOW (ref 4.22–5.81)
RDW: 12.5 % (ref 11.5–15.5)
WBC: 11.2 10*3/uL — ABNORMAL HIGH (ref 4.0–10.5)
nRBC: 0 % (ref 0.0–0.2)

## 2020-09-23 LAB — PROCALCITONIN: Procalcitonin: 16.85 ng/mL

## 2020-09-23 MED ORDER — SODIUM CHLORIDE 0.9 % IV SOLN
1.0000 g | INTRAVENOUS | Status: DC
  Administered 2020-09-23 – 2020-09-27 (×5): 1 g via INTRAVENOUS
  Filled 2020-09-23 (×3): qty 1
  Filled 2020-09-23: qty 10
  Filled 2020-09-23 (×2): qty 1
  Filled 2020-09-23: qty 10

## 2020-09-23 MED ORDER — SODIUM CHLORIDE 0.9 % IV SOLN
500.0000 mg | INTRAVENOUS | Status: DC
  Administered 2020-09-23 – 2020-09-24 (×2): 500 mg via INTRAVENOUS
  Filled 2020-09-23 (×3): qty 500

## 2020-09-23 MED ORDER — AMLODIPINE BESYLATE 5 MG PO TABS: 5.0000 mg | ORAL_TABLET | Freq: Every day | ORAL | Status: DC

## 2020-09-23 MED ORDER — FLUTICASONE FUROATE-VILANTEROL 200-25 MCG/INH IN AEPB
1.0000 | INHALATION_SPRAY | Freq: Every day | RESPIRATORY_TRACT | Status: DC
  Administered 2020-09-23 – 2020-09-28 (×6): 1 via RESPIRATORY_TRACT
  Filled 2020-09-23: qty 28

## 2020-09-23 NOTE — TOC Progression Note (Signed)
Transition of Care [facility]) - Progression Note    Patient Details  Name: [name] Browning MRN: [id number] Date of Birth: 05/05/1940  [facility] CM/SW [place], RN Phone Number: 09/23/2020, 1:49 PM  Clinical Narrative:    [facility] requested for therapy, inquiry out to [facility].  Toc contact information given, TOC to discharge.         Expected Discharge Plan and Services                                                 Social Determinants of Health (SDOH) Interventions    Readmission Risk Interventions No flowsheet data found.

## 2020-09-23 NOTE — Progress Notes (Signed)
PROGRESS NOTE    [name] Browning  [id number] DOB: 08/03/1940 DOA: 09/18/2020 PCP: [place], [place]   Chief complaint.  Shortness of breath. Brief Narrative:  [name] Browning is a 80 y.o. male with medical history significant for COPD, paroxysmal atrial fibrillation on Eliquis, TIA, hyperlipidemia, anxiety and recent umbilical hernia repair who presents with concerns of increasing shortness of breath and left-sided abdominal pain.  Patient is started on steroids for COPD exacerbation.  CT scan also showed left kidney stone with hydronephrosis.  Urology placed left ureteral stent on 9/11. Post procedure he was tachypneic, hypoxic and hypotensive requiring ICU/SD and BiPAP   Assessment & Plan:   Active Problems:   HTN (hypertension)   Hyperlipidemia   COPD exacerbation (HCC)   Acute respiratory failure with hypoxia (HCC)   AF (paroxysmal atrial fibrillation) (HCC)   Urinary tract obstruction by kidney stone   AKI (acute kidney injury) ([place])   Malnutrition of moderate degree  Acute hypoxemic respiratory failure secondary to COPD exacerbation. COPD exacerbation. Post op atelectasis with interstitial edema requiring BiPAP and diuresis with lasix  Patient is still requiring 4 L oxygen via nasal cannula. Continue Lasix 40 mg IV daily and Solu-Medrol 40 mg IV daily Net IO Since Admission: -4,772.3 mL [09/23/20 1644]  Procalcitonin less than 0.1 so antibiotics stopped.  Was given dose of Unasyn for possible aspiration.   9/12 CXR Stable airspace opacities bilaterally. No new focal abnormality is noted. 9/14 started Breo Ellipta inhaler, as patient was significantly wheezing   # Community-acquired pneumonia, chest x-ray shows airspace opacity, WBC count elevated, it could be due to steroids but procalcitonin is also elevated, patient still symptomatic, requiring supplemental O2 inhalation Empirically started ceftriaxone azithromycin    Obstructing left proximal ureter stone with  left-sided hydronephrosis Acute kidney injury secondary to obstruction.  CKD stage II S/p left ureteral stent by urology on 9/11 Kidney function back to baseline now Urine culture had no growth.  Antibiotics stopped  Paroxysmal atrial fibrillation. Continue Eliquis  Patient had an episode of hemoptysis, continue to watch  H&H remained stable  Hypotension with a history of essential hypertension. Holding losartan and amlodipine due to hypotension 9/14 BP improved, resume amlodipine 5 mg p.o. tomorrow a.m. with holding parameters We will continue monitor BP and titrate medications accordingly  Constipation.  Resolved with bowel regimen   DVT prophylaxis: SCDs, Eliquis. Code Status: full Family Communication: None today Disposition Plan:    Status is: Inpatient  Remains inpatient appropriate because:Ongoing diagnostic testing needed not appropriate for outpatient work up, IV treatments appropriate due to intensity of illness or inability to take PO, and Inpatient level of care appropriate due to severity of illness  Dispo: The patient is from: Home              Anticipated d/c is to: Home              Patient currently is not medically stable to d/c.  Still hypoxic on supplemental O2 inhalation, may need 2 to 3 days more   Difficult to place patient No    I/O last 3 completed shifts: In: 3 [P.O.:720] Out: 27 [Urine:1625] Total I/O In: 240 [P.O.:240] Out: 102 [Urine:970]     Consultants:  Urology PCCM  Procedures: Cystoscopy with left ureteral stent placement on 09/20/2020  Antimicrobials: none.  He had received dose of Unasyn and also Rocephin which are stopped now  Subjective: No significant overnight events, patient is breathing is improving but is still short of breath and requiring supplemental O2 inhalation.  Patient denied worsening of shortness of breath, no chest pain or palpitations, denied any abdominal  pain.   Objective: Vitals:   09/22/20 2119 09/23/20 0512 09/23/20 0837 09/23/20 1632  BP: (!) 142/79 121/85 107/71 134/67  Pulse: 90 90 72 81  Resp: '16  20 16  '$ Temp: 97.6 F (36.4 C) 97.6 F (36.4 C) 98.3 F (36.8 C) 98.2 F (36.8 C)  TempSrc:   Oral Oral  SpO2: 92% 95% 94% 93%  Weight:      Height:        Intake/Output Summary (Last 24 hours) at 09/23/2020 1644 Last data filed at 09/23/2020 1426 Gross per 24 hour  Intake 480 ml  Output 1295 ml  Net -815 ml   Filed Weights   09/18/20 0926 09/18/20 2023  Weight: 80.7 kg 77.4 kg    Examination:  General exam: Appears calm and comfortable  Respiratory system: Mild bibasilar crackles and mild wheezing bilaterally.   Cardiovascular system: S1 & S2 heard, RRR. No JVD, murmurs, rubs, gallops or clicks. No pedal edema. Gastrointestinal system: Abdomen is nondistended, soft and nontender. No organomegaly or masses felt. Normal bowel sounds heard. Central nervous system: Alert and oriented. No focal neurological deficits. Extremities: Symmetric 5 x 5 power. Skin: No rashes, lesions or ulcers Psychiatry: Judgement and insight appear normal. Mood & affect appropriate.     Data Reviewed: I have personally reviewed following labs and imaging studies  CBC: Recent Labs  Lab 09/18/20 0929 09/20/20 0418 09/21/20 0428 09/22/20 0347 09/23/20 0417  WBC 7.7 20.7* 7.6 9.4 11.2*  NEUTROABS  --  18.6* 6.7  --   --   HGB 13.0 13.3 13.8 12.6* 12.0*  HCT 38.8* 41.0 41.9 39.5 37.1*  MCV 93.0 94.0 95.7 93.6 92.5  PLT 179 191 173 148* [id number]   Basic Metabolic Panel: Recent Labs  Lab 09/18/20 0929 09/19/20 0441 09/20/20 0418 09/21/20 0428 09/22/20 0347  NA 138 135 138 141 138  K 3.9 4.3 4.9 4.1 3.9  CL 107 104 105 104 100  CO2 '23 25 25 27 31  '$ GLUCOSE 118* 185* 135* 115* 133*  BUN 19 28* 31* 36* 50*  CREATININE 1.09 1.41* 1.31* 1.46* 1.41*  CALCIUM 9.6 9.0 9.3 8.5* 8.8*  MG  --   --  2.3 1.8  --    GFR: Estimated Creatinine  Clearance: 46.5 mL/min (A) (by C-G formula based on SCr of 1.41 mg/dL (H)). Liver Function Tests: Recent Labs  Lab 09/18/20 0929  AST 26  ALT 21  ALKPHOS 143*  BILITOT 1.2  PROT 7.3  ALBUMIN 3.8   Recent Labs  Lab 09/18/20 0929  LIPASE 25   No results for input(s): AMMONIA in the last 168 hours. Coagulation Profile: No results for input(s): INR, PROTIME in the last 168 hours. Cardiac Enzymes: No results for input(s): CKTOTAL, CKMB, CKMBINDEX, TROPONINI in the last 168 hours. BNP (last 3 results) No results for input(s): PROBNP in the last 8760 hours. HbA1C: No results for input(s): HGBA1C in the last 72 hours. CBG: Recent Labs  Lab 09/20/20 1654  GLUCAP 153*   Lipid Profile: No results for input(s): CHOL, HDL, LDLCALC, TRIG, CHOLHDL, LDLDIRECT in the last 72 hours. Thyroid Function Tests: No results for input(s): TSH, T4TOTAL, FREET4, T3FREE, THYROIDAB in the last 72 hours. Anemia Panel: No results for input(s): VITAMINB12, FOLATE, FERRITIN, TIBC, IRON, RETICCTPCT  in the last 72 hours. Sepsis Labs: Recent Labs  Lab 09/20/20 1806 09/23/20 0417  PROCALCITON <0.10 16.85    Recent Results (from the past 240 hour(s))  Resp Panel by RT-PCR (Flu A&B, Covid) Nasopharyngeal Swab     Status: None   Collection Time: 09/18/20 12:34 PM   Specimen: Nasopharyngeal Swab; Nasopharyngeal(NP) swabs in vial transport medium  Result Value Ref Range Status   SARS Coronavirus 2 by RT PCR NEGATIVE NEGATIVE Final    Comment: (NOTE) SARS-CoV-2 target nucleic acids are NOT DETECTED.  The SARS-CoV-2 RNA is generally detectable in upper respiratory specimens during the acute phase of infection. The lowest concentration of SARS-CoV-2 viral copies this assay can detect is 138 copies/mL. A negative result does not preclude SARS-Cov-2 infection and should not be used as the sole basis for treatment or other patient management decisions. A negative result may occur with  improper specimen  collection/handling, submission of specimen other than nasopharyngeal swab, presence of viral mutation(s) within the areas targeted by this assay, and inadequate number of viral copies(<138 copies/mL). A negative result must be combined with clinical observations, patient history, and epidemiological information. The expected result is Negative.  Fact Sheet for Patients:  EntrepreneurPulse.com.au  Fact Sheet for Healthcare Providers:  IncredibleEmployment.be  This test is no t yet approved or cleared by the [place] FDA and  has been authorized for detection and/or diagnosis of SARS-CoV-2 by FDA under an Emergency Use Authorization (EUA). This EUA will remain  in effect (meaning this test can be used) for the duration of the COVID-19 declaration under Section 564(b)(1) of the Act, 21 U.S.C.section 360bbb-3(b)(1), unless the authorization is terminated  or revoked sooner.       Influenza A by PCR NEGATIVE NEGATIVE Final   Influenza B by PCR NEGATIVE NEGATIVE Final    Comment: (NOTE) The Xpert Xpress SARS-CoV-2/FLU/RSV plus assay is intended as an aid in the diagnosis of influenza from Nasopharyngeal swab specimens and should not be used as a sole basis for treatment. Nasal washings and aspirates are unacceptable for Xpert Xpress SARS-CoV-2/FLU/RSV testing.  Fact Sheet for Patients: EntrepreneurPulse.com.au  Fact Sheet for Healthcare Providers: IncredibleEmployment.be  This test is not yet approved or cleared by the [place] FDA and has been authorized for detection and/or diagnosis of SARS-CoV-2 by FDA under an Emergency Use Authorization (EUA). This EUA will remain in effect (meaning this test can be used) for the duration of the COVID-19 declaration under Section 564(b)(1) of the Act, 21 U.S.C. section 360bbb-3(b)(1), unless the authorization is terminated or revoked.  Performed at [facility], [address], [place], [place] [address]   Urine Culture     Status: None   Collection Time: 09/20/20  1:34 PM   Specimen: Urine, Random  Result Value Ref Range Status   Specimen Description   Final    URINE, RANDOM Performed at [facility], [address], [place], [place] [address]    Special Requests   Final    NONE Performed at [facility], [address] [place], [place] [address]    Culture   Final    NO GROWTH Performed at [facility], [place] [address], [place], [place] [address]    Report Status 09/21/2020 FINAL  Final         Radiology Studies: No results found.      Scheduled Meds:  apixaban  5 mg Oral BID   aspirin EC  81 mg Oral QPC  supper   atorvastatin  40 mg Oral QPC supper   feeding supplement  237 mL Oral TID BM   ferrous gluconate  324 mg Oral Q lunch   methylPREDNISolone (SOLU-MEDROL) injection  40 mg Intravenous Daily   montelukast  10 mg Oral QHS   pantoprazole  40 mg Oral QPC supper   polyethylene glycol  17 g Oral Daily   senna-docusate  2 tablet Oral BID   Continuous Infusions:  sodium chloride     promethazine (PHENERGAN) injection (IM or IVPB) Stopped (09/20/20 0519)     LOS: 4 days    Time spent: 32 minutes    Val Riles, MD [facility]   To contact the attending provider between 7A-7P or the covering provider during after hours 7P-7A, please log into the web site [email or web address] and access using universal [place] password for that web site. If you do not have the password, please call the hospital operator.  09/23/2020, 4:44 PM

## 2020-09-23 NOTE — Evaluation (Signed)
Occupational Therapy Evaluation Patient Details Name: [name] Browning MRN: [id number] DOB: 1940-07-10 Today's Date: 09/23/2020   History of Present Illness Pt is a 80 y.o. male with medical history significant for COPD, paroxysmal atrial fibrillation, TIA, hyperlipidemia, anxiety and recent umbilical hernia repair who presents with concerns of increasing shortness of breath and left-sided abdominal pain. MD assessment includes: Acute hypoxemic respiratory failure secondary to COPD exacerbation, obstructing left proximal ureter stone with left-sided hydronephrosis s/p ureteral stent placement 9/11, AKI, and hypotension.   Clinical Impression  Pt seen for OT evaluation this date. Upon arrival to room, pt seated upright in recliner on 4L of O2 via [place] following session with PT. Pt agreeable to OT tx. Prior to admission, pt was independent in all ADLs and functional mobility, living in a mobile home with ex-wife. Pt does not use supplemental O2 at baseline. Pt currently requires MIN GUARD for standing grooming tasks, MIN GUARD for toileting/clothing manipulation, and MIN GUARD for functional mobility of short household distances (~27f) without AD due to current functional impairments (See OT Problem List below). Of note, while on 6L of O2 via portable O2 tank, pt with SpO2 89-90% during standing grooming tasks and functional mobility. At end of session, pt left on 4L with SpO2 93%. Pt would benefit from additional skilled OT services to maximize return to PLOF and minimize risk of future falls, injury, caregiver burden, and readmission. Upon discharge, recommend [facility].   Recommendations for follow up therapy are one component of a multi-disciplinary discharge planning process, led by the attending physician.  Recommendations may be updated based on patient status, additional functional criteria and insurance authorization.   Follow Up Recommendations  Home health OT;Supervision - Intermittent     Equipment Recommendations  3 in 1 bedside commode       Precautions / Restrictions Precautions Precautions: Fall Restrictions Weight Bearing Restrictions: No Other Position/Activity Restrictions: Watch SpO2 with ambulation      Mobility Bed Mobility Overal bed mobility: Modified Independent             General bed mobility comments: not assessed, pt in recliner at beginning/end of session    Transfers Overall transfer level: Needs assistance Equipment used: Rolling walker (2 wheeled) Transfers: Sit to/from Stand Sit to Stand: Min guard         General transfer comment: Extra time and effort to come to standing but no physical assistance required; min to mod verbal cues for hand placement    Balance Overall balance assessment: Needs assistance   Sitting balance-Leahy Scale: Good Sitting balance - Comments: good sitting balance reaching outside BOS to adjust socks   Standing balance support: During functional activity;No upper extremity supported Standing balance-Leahy Scale: Fair Standing balance comment: MIN GUARD for functional mobility of short household distances without RW                           ADL either performed or assessed with clinical judgement   ADL Overall ADL's : Needs assistance/impaired     Grooming: Wash/dry hands;Wash/dry face;Applying deodorant;Min guard;Standing                       Toileting- Clothing Manipulation and Hygiene: Min guard;Sit to/from stand Toileting - Clothing Manipulation Details (indicate cue type and reason): MIN GUARD to raise toilet seat and to urinate while standing at toilet     Functional mobility during ADLs: Supervision/safety;Rolling walker;Min guard (SUPERVISION for  functional mobility of short household distances (~51f) with RW. MIN GUARD for functional mobility without AD (~20 ft))       Vision Baseline Vision/History: 0 No visual deficits              Pertinent Vitals/Pain  Pain Assessment: 0-10 Pain Score: 4  Pain Location: L side (posteriorlateral) Pain Descriptors / Indicators: Sore Pain Intervention(s): Premedicated before session;Monitored during session        Extremity/Trunk Assessment Upper Extremity Assessment Upper Extremity Assessment: Overall WFL for tasks assessed   Lower Extremity Assessment Lower Extremity Assessment: Generalized weakness   Cervical / Trunk Assessment Cervical / Trunk Assessment: Normal   Communication Communication Communication: No difficulties   Cognition Arousal/Alertness: Awake/alert Behavior During Therapy: WFL for tasks assessed/performed Overall Cognitive Status: Within Functional Limits for tasks assessed                                 General Comments: A&Ox4. Pleasant and agreeable throughout   General Comments  Pt on 4L upon arrival. Prior to functional mobility/standing ADLs, O2 increased to 6L/min with SpO2 89-90% throughout session. At end of session, pt returned to 4L, with SpO3 93% sitting in recliner    Exercises Total Joint Exercises Ankle Circles/Pumps: AROM;Strengthening;Both;10 reps Quad Sets: Strengthening;Both;10 reps Gluteal Sets: Strengthening;Both;10 reps Towel Squeeze: Strengthening;Both;10 reps Heel Slides: Strengthening;Both;10 reps Hip ABduction/ADduction: Strengthening;Both;10 reps Long Arc Quad: Strengthening;Both;10 reps Marching in Standing: Strengthening;Both;5 reps;Standing Other Exercises Other Exercises: Pt educated on role of OT, POC, and d/c recommendations        Home Living Family/patient expects to be discharged to:: Private residence Living Arrangements: Other (Comment) (Pt lives with ex wife) Available Help at Discharge: Family;Available 24 hours/day Type of Home: Mobile home Home Access: Stairs to enter Entrance Stairs-Number of Steps: 4 Entrance Stairs-Rails: Right;Left;Can reach both Home Layout: One level     Bathroom Shower/Tub:  TTeacher, early years/pre Handicapped height     Home Equipment: Cane - quad;Shower seat;Hand held shower head          Prior Functioning/Environment Level of Independence: Independent        Comments: Ind amb limited community distances without an AD, Ind with ADLs/IADLs, no fall history        OT Problem List: Decreased strength;Decreased activity tolerance;Impaired balance (sitting and/or standing);Decreased knowledge of use of DME or AE;Cardiopulmonary status limiting activity      OT Treatment/Interventions: Self-care/ADL training;Therapeutic exercise;Energy conservation;DME and/or AE instruction;Therapeutic activities;Patient/family education;Balance training    OT Goals(Current goals can be found in the care plan section) Acute Rehab OT Goals Patient Stated Goal: to return to mowing lawn OT Goal Formulation: With patient Time For Goal Achievement: 10/07/20 Potential to Achieve Goals: Fair ADL Goals Pt Will Perform Grooming: with modified independence;standing Pt Will Transfer to Toilet: with modified independence;ambulating;regular height toilet Pt Will Perform Toileting - Clothing Manipulation and hygiene: with modified independence;sitting/lateral leans  OT Frequency: Min 1X/week    AM-PAC OT "6 Clicks" Daily Activity     Outcome Measure Help from another person eating meals?: None Help from another person taking care of personal grooming?: A Little Help from another person toileting, which includes using toliet, bedpan, or urinal?: A Little Help from another person bathing (including washing, rinsing, drying)?: A Little Help from another person to put on and taking off regular upper body clothing?: None Help from another person to put on and taking off  regular lower body clothing?: A Little 6 Click Score: 20   End of Session Equipment Utilized During Treatment: Gait belt;Rolling walker Nurse Communication: Mobility status  Activity Tolerance:  Patient tolerated treatment well Patient left: in chair;with call bell/phone within reach;with chair alarm set  OT Visit Diagnosis: Unsteadiness on feet (R26.81);Muscle weakness (generalized) (M62.81)                Time: [id number] OT Time Calculation (min): 24 min Charges:  OT General Charges $OT Visit: 1 Visit OT Evaluation $OT Eval Moderate Complexity: 1 Mod OT Treatments $Self Care/Home Management : 8-22 mins  Fredirick Maudlin, OTR/L [place]

## 2020-09-23 NOTE — Evaluation (Signed)
Physical Therapy Evaluation Patient Details Name: [name] Browning MRN: [id number] DOB: 1940-04-27 Today's Date: 09/23/2020  History of Present Illness  Pt is a 80 y.o. male with medical history significant for COPD, paroxysmal atrial fibrillation, TIA, hyperlipidemia, anxiety and recent umbilical hernia repair who presents with concerns of increasing shortness of breath and left-sided abdominal pain. MD assessment includes: Acute hypoxemic respiratory failure secondary to COPD exacerbation, obstructing left proximal ureter stone with left-sided hydronephrosis s/p ureteral stent placement 9/11, AKI, and hypotension.   Clinical Impression  Pt was pleasant and motivated to participate during the session and put forth good effort during the session.  Pt required extra time and effort with bed mobility and transfers but no physical assist. Pt was able to amb 30 feet on 4LO2/min with SpO2 dropping from the low 90s at rest to 85% and quickly increased back to the low 90s (<15 sec) upon sitting with cues for PLB. Nursing notified and requested pt placed on 6LO2/min for additional ambulation.  Pt then ambulated another 30 feet on 6L with SpO2 dropping to a low of 89% and again increasing quickly to the low 90s upon sitting with PLB. Pt will benefit from HHPT upon discharge to safely address deficits listed in patient problem list for decreased caregiver assistance and eventual return to PLOF.         Recommendations for follow up therapy are one component of a multi-disciplinary discharge planning process, led by the attending physician.  Recommendations may be updated based on patient status, additional functional criteria and insurance authorization.  Follow Up Recommendations Home health PT;Supervision for mobility/OOB    Equipment Recommendations  Rolling walker with 5" wheels    Recommendations for Other Services       Precautions / Restrictions Precautions Precautions: Fall Restrictions Weight  Bearing Restrictions: No Other Position/Activity Restrictions: Watch SpO2 with ambulation      Mobility  Bed Mobility Overal bed mobility: Modified Independent             General bed mobility comments: Extra time and effort only    Transfers Overall transfer level: Needs assistance Equipment used: Rolling walker (2 wheeled) Transfers: Sit to/from Stand Sit to Stand: Min guard;From elevated surface         General transfer comment: Extra time and effort to come to standing but no physical assistance required; min to mod verbal cues for hand placement  Ambulation/Gait Ambulation/Gait assistance: Min guard Gait Distance (Feet): 30 Feet x 2 Assistive device: Rolling walker (2 wheeled) Gait Pattern/deviations: Step-through pattern;Decreased step length - right;Decreased step length - left Gait velocity: decreased   General Gait Details: Slow cadence with short B step length but steady without LOB including during 180 deg turns with walker held off the ground; SpO2 dropped to 85% after amb on 4L and to 89% after amb on 6LO2/min  Stairs            Wheelchair Mobility    Modified Rankin (Stroke Patients Only)       Balance Overall balance assessment: Needs assistance   Sitting balance-Leahy Scale: Good     Standing balance support: Bilateral upper extremity supported;During functional activity Standing balance-Leahy Scale: Good Standing balance comment: Min lean on the RW for support                             Pertinent Vitals/Pain Pain Assessment: 0-10 Pain Score: 4  Pain Location: L side (posteriorlateral) Pain Descriptors /  Indicators: Sore Pain Intervention(s): Premedicated before session;Monitored during session    [place] expects to be discharged to:: Private residence Living Arrangements: Other (Comment) (Pt lives with ex wife) Available Help at Discharge: Family;Available 24 hours/day Type of Home: Mobile  home Home Access: Stairs to enter Entrance Stairs-Rails: Right;Left;Can reach both Entrance Stairs-Number of Steps: 4 Home Layout: One level Home Equipment: Cane - quad;Shower seat;Hand held shower head      Prior Function Level of Independence: Independent         Comments: Ind amb limited community distances without an AD, Ind with ADLs, no fall history, Ind with ADLs     Hand Dominance        Extremity/Trunk Assessment   Upper Extremity Assessment Upper Extremity Assessment: Generalized weakness    Lower Extremity Assessment Lower Extremity Assessment: Generalized weakness       Communication   Communication: No difficulties  Cognition Arousal/Alertness: Awake/alert Behavior During Therapy: WFL for tasks assessed/performed Overall Cognitive Status: Within Functional Limits for tasks assessed                                        General Comments      Exercises Total Joint Exercises Ankle Circles/Pumps: AROM;Strengthening;Both;10 reps Quad Sets: Strengthening;Both;10 reps Gluteal Sets: Strengthening;Both;10 reps Towel Squeeze: Strengthening;Both;10 reps Heel Slides: Strengthening;Both;10 reps Hip ABduction/ADduction: Strengthening;Both;10 reps Long Arc Quad: Strengthening;Both;10 reps Marching in Standing: Strengthening;Both;5 reps;Standing Other Exercises Other Exercises: HEP education for BLE APs, GS, and QS x 10 each every 1-2 hours daily   Assessment/Plan    PT Assessment Patient needs continued PT services  PT Problem List Decreased strength;Decreased activity tolerance;Decreased balance;Decreased mobility;Decreased knowledge of use of DME       PT Treatment Interventions DME instruction;Gait training;Stair training;Functional mobility training;Therapeutic activities;Therapeutic exercise;Balance training;Patient/family education    PT Goals (Current goals can be found in the Care Plan section)  Acute Rehab PT Goals Patient  Stated Goal: To get stronger PT Goal Formulation: With patient Time For Goal Achievement: 10/06/20 Potential to Achieve Goals: Fair    Frequency Min 2X/week   Barriers to discharge        Co-evaluation               AM-PAC PT "6 Clicks" Mobility  Outcome Measure Help needed turning from your back to your side while in a flat bed without using bedrails?: None Help needed moving from lying on your back to sitting on the side of a flat bed without using bedrails?: None Help needed moving to and from a bed to a chair (including a wheelchair)?: A Little Help needed standing up from a chair using your arms (e.g., wheelchair or bedside chair)?: A Little Help needed to walk in hospital room?: A Little Help needed climbing 3-5 steps with a railing? : A Little 6 Click Score: 20    End of Session Equipment Utilized During Treatment: Gait belt;Oxygen Activity Tolerance: Patient tolerated treatment well Patient left: in chair;Other (comment) (Pt left with OT for OT evaluation) Nurse Communication: Mobility status;Other (comment) (SpO2 response to activity) PT Visit Diagnosis: Difficulty in walking, not elsewhere classified (R26.2);Muscle weakness (generalized) (M62.81)    Time: [id number] PT Time Calculation (min) (ACUTE ONLY): 28 min   Charges:   PT Evaluation $PT Eval Moderate Complexity: 1 Mod PT Treatments $Therapeutic Exercise: 8-22 mins        D. Scott Alysiah Suppa  PT, DPT 09/23/20, 1:34 PM

## 2020-09-24 ENCOUNTER — Encounter: Payer: Self-pay | Admitting: Internal Medicine

## 2020-09-24 DIAGNOSIS — J9601 Acute respiratory failure with hypoxia: Secondary | ICD-10-CM | POA: Diagnosis not present

## 2020-09-24 LAB — BASIC METABOLIC PANEL
Anion gap: 8 (ref 5–15)
BUN: 46 mg/dL — ABNORMAL HIGH (ref 8–23)
CO2: 30 mmol/L (ref 22–32)
Calcium: 9.2 mg/dL (ref 8.9–10.3)
Chloride: 99 mmol/L (ref 98–111)
Creatinine, Ser: 1.05 mg/dL (ref 0.61–1.24)
GFR, Estimated: >60 mL/min (ref >60)
Glucose, Bld: 124 mg/dL — ABNORMAL HIGH (ref 70–99)
Potassium: 4.3 mmol/L (ref 3.5–5.1)
Sodium: 137 mmol/L (ref 135–145)

## 2020-09-24 LAB — CBC
HCT: 36.4 % — ABNORMAL LOW (ref 39.0–52.0)
Hemoglobin: 12.3 g/dL — ABNORMAL LOW (ref 13.0–17.0)
MCH: 31.6 pg (ref 26.0–34.0)
MCHC: 33.8 g/dL (ref 30.0–36.0)
MCV: 93.6 fL (ref 80.0–100.0)
Platelets: 184 10*3/uL (ref 150–400)
RBC: 3.89 MIL/uL — ABNORMAL LOW (ref 4.22–5.81)
RDW: 12.4 % (ref 11.5–15.5)
WBC: 12.6 10*3/uL — ABNORMAL HIGH (ref 4.0–10.5)
nRBC: 0 % (ref 0.0–0.2)

## 2020-09-24 LAB — MAGNESIUM: Magnesium: 2.2 mg/dL (ref 1.7–2.4)

## 2020-09-24 LAB — PHOSPHORUS: Phosphorus: 4.2 mg/dL (ref 2.5–4.6)

## 2020-09-24 MED ORDER — FUROSEMIDE 10 MG/ML IJ SOLN
20.0000 mg | Freq: Once | INTRAMUSCULAR | Status: AC
  Administered 2020-09-24: 17:00:00 20 mg via INTRAVENOUS
  Filled 2020-09-24: qty 2

## 2020-09-24 MED ORDER — GUAIFENESIN ER 600 MG PO TB12
600.0000 mg | ORAL_TABLET | Freq: Two times a day (BID) | ORAL | Status: DC
  Administered 2020-09-24 – 2020-09-28 (×9): 600 mg via ORAL
  Filled 2020-09-24 (×9): qty 1

## 2020-09-24 MED ORDER — GUAIFENESIN-DM 100-10 MG/5ML PO SYRP: 5.0000 mL | ORAL_SOLUTION | ORAL | Status: DC | PRN

## 2020-09-24 MED ORDER — SALINE SPRAY 0.65 % NA SOLN
1.0000 | NASAL | Status: DC | PRN
  Filled 2020-09-24: qty 44

## 2020-09-24 NOTE — TOC Progression Note (Signed)
Transition of Care [facility]) - Progression Note    Patient Details  Name: [name] Browning MRN: [id number] Date of Birth: 09/06/1940  Transition of Care [facility]) CM/SW [place], RN Phone Number: 09/24/2020, 3:57 PM  Clinical Narrative:   Patient lives at home with a companion.  States he has no concerns with transportation to appointments, and is able to get to the pharmacy.  He states his granddaughter, who is a Marine scientist lives next door and checks on him.  Recommendation is for Home Health Physical Therapy, patient and fa mily decline this recommendation at this time.  Patient has no concerns about returning home after discharge, emphasizing that he has quite a bit of family support.  TOC contact information given for needs.         Expected Discharge Plan and Services                                                 Social Determinants of Health (SDOH) Interventions    Readmission Risk Interventions No flowsheet data found.

## 2020-09-24 NOTE — Progress Notes (Signed)
PROGRESS NOTE    [name] Browning  [id number] DOB: 13-Dec-1940 DOA: 09/18/2020 PCP: [place] [place]   Chief complaint.  Shortness of breath. Brief Narrative:  [name] Browning is a 80 y.o. male with medical history significant for COPD, paroxysmal atrial fibrillation on Eliquis, TIA, hyperlipidemia, anxiety and recent umbilical hernia repair who presents with concerns of increasing shortness of breath and left-sided abdominal pain.  Patient is started on steroids for COPD exacerbation.  CT scan also showed left kidney stone with hydronephrosis.  Urology placed left ureteral stent on 9/11. Post procedure he was tachypneic, hypoxic and hypotensive requiring ICU/SD and BiPAP   Assessment & Plan:   Active Problems:   HTN (hypertension)   Hyperlipidemia   COPD exacerbation (HCC)   Acute respiratory failure with hypoxia (HCC)   AF (paroxysmal atrial fibrillation) (HCC)   Urinary tract obstruction by kidney stone   AKI (acute kidney injury) ([place])   Malnutrition of moderate degree  Acute hypoxemic respiratory failure secondary to COPD exacerbation. COPD exacerbation. Post op atelectasis with interstitial edema requiring BiPAP and diuresis with lasix  Patient is still requiring 4 L oxygen via nasal cannula. Continue Lasix 40 mg IV daily and Solu-Medrol 40 mg IV daily Net IO Since Admission: -8,692.3 mL [09/24/20 1642]  Procalcitonin less than 0.1 so antibiotics stopped.  Was given dose of Unasyn for possible aspiration.   9/12 CXR Stable airspace opacities bilaterally. No new focal abnormality is noted. 9/14 started Breo Ellipta inhaler, as patient was significantly wheezing 9/15 Lasix 20 mg IV x1 dose ordered  # Community-acquired pneumonia, chest x-ray shows airspace opacity, WBC count elevated, it could be due to steroids but procalcitonin is also elevated, patient still symptomatic, requiring supplemental O2 inhalation Empirically started ceftriaxone azithromycin Started Mucinex twice  daily, continue Robitussin-DM as needed   Obstructing left proximal ureter stone with left-sided hydronephrosis Acute kidney injury secondary to obstruction.  CKD stage II S/p left ureteral stent by urology on 9/11 Kidney function back to baseline now Urine culture had no growth.  Antibiotics stopped  Paroxysmal atrial fibrillation. Continue Eliquis  Patient had an episode of hemoptysis, continue to watch  H&H remained stable  Hypotension with a history of essential hypertension. Holding losartan and amlodipine due to hypotension 9/14 BP improved, resume amlodipine 5 mg p.o. tomorrow a.m. with holding parameters We will continue monitor BP and titrate medications accordingly  Constipation.  Resolved with bowel regimen  AKI most likely due to dehydration. Continue oral hydration, creatinine improved  Continue monitor renal functions and urine output, avoid nephrotoxic medications.   DVT prophylaxis: SCDs, Eliquis. Code Status: full Family Communication: None today Disposition Plan:    Status is: Inpatient  Remains inpatient appropriate because:Ongoing diagnostic testing needed not appropriate for outpatient work up, IV treatments appropriate due to intensity of illness or inability to take PO, and Inpatient level of care appropriate due to severity of illness  Dispo: The patient is from: Home              Anticipated d/c is to: Home              Patient currently is not medically stable to d/c.  Still hypoxic on supplemental O2 inhalation, may need 2 to 3 days more   Difficult to place patient No    I/O last 3 completed shifts: In: 340 [P.O.:240; IV Piggyback:100] Out: 4735 [id number] Total I/O In: 85 [P.O.:720; IV Piggyback:250] Out: 1450 [Urine:1450]     Consultants:  Urology PCCM  Procedures: Cystoscopy with left ureteral stent placement on 09/20/2020  Antimicrobials: none.  He had received dose of Unasyn and also Rocephin which are stopped now                              Subjective: No significant overnight events, patient's breathing remained stable, still patient does not feel any improvement and has dyspnea on exertion while walking to the bathroom.  Patient denies any chest pain or palpitations, no any abdominal pain, no nausea vomiting or diarrhea.   Objective: Vitals:   09/24/20 0426 09/24/20 0735 09/24/20 1127 09/24/20 1222  BP: 136/88 119/81 124/75   Pulse: 72 73 92   Resp: '18 20 20   '$ Temp: 97.7 F (36.5 C) 97.7 F (36.5 C) 97.8 F (36.6 C)   TempSrc:      SpO2: 94% 97% 95% 93%  Weight:      Height:        Intake/Output Summary (Last 24 hours) at 09/24/2020 1642 Last data filed at 09/24/2020 1620 Gross per 24 hour  Intake 1070 ml  Output 4990 ml  Net -3920 ml   Filed Weights   09/18/20 0926 09/18/20 2023  Weight: 80.7 kg 77.4 kg    Examination:  General exam: Appears calm and comfortable  Respiratory system: Mild bibasilar crackles and mild wheezing bilaterally.   Cardiovascular system: S1 & S2 heard, RRR. No JVD, murmurs, rubs, gallops or clicks. No pedal edema. Gastrointestinal system: Abdomen is nondistended, soft and nontender. No organomegaly or masses felt. Normal bowel sounds heard. Central nervous system: Alert and oriented. No focal neurological deficits. Extremities: Symmetric 5 x 5 power. Skin: No rashes, lesions or ulcers Psychiatry: Judgement and insight appear normal. Mood & affect appropriate.     Data Reviewed: I have personally reviewed following labs and imaging studies  CBC: Recent Labs  Lab 09/20/20 0418 09/21/20 0428 09/22/20 0347 09/23/20 0417 09/24/20 0430  WBC 20.7* 7.6 9.4 11.2* 12.6*  NEUTROABS 18.6* 6.7  --   --   --   HGB 13.3 13.8 12.6* 12.0* 12.3*  HCT 41.0 41.9 39.5 37.1* 36.4*  MCV 94.0 95.7 93.6 92.5 93.6  PLT 191 173 148* 172 [id number]   Basic Metabolic Panel: Recent Labs  Lab 09/19/20 0441 09/20/20 0418 09/21/20 0428 09/22/20 0347 09/24/20 0430  NA 135 138 141  138 137  K 4.3 4.9 4.1 3.9 4.3  CL 104 105 104 100 99  CO2 '25 25 27 31 30  '$ GLUCOSE 185* 135* 115* 133* 124*  BUN 28* 31* 36* 50* 46*  CREATININE 1.41* 1.31* 1.46* 1.41* 1.05  CALCIUM 9.0 9.3 8.5* 8.8* 9.2  MG  --  2.3 1.8  --  2.2  PHOS  --   --   --   --  4.2   GFR: Estimated Creatinine Clearance: 62.5 mL/min (by C-G formula based on SCr of 1.05 mg/dL). Liver Function Tests: Recent Labs  Lab 09/18/20 0929  AST 26  ALT 21  ALKPHOS 143*  BILITOT 1.2  PROT 7.3  ALBUMIN 3.8   Recent Labs  Lab 09/18/20 0929  LIPASE 25   No results for input(s): AMMONIA in the last 168 hours. Coagulation Profile: No results for input(s): INR, PROTIME in the last 168 hours. Cardiac Enzymes: No results for input(s): CKTOTAL, CKMB, CKMBINDEX, TROPONINI in the last 168 hours. BNP (last 3 results) No results for input(s): PROBNP in the last 8760 hours. HbA1C: No results  for input(s): HGBA1C in the last 72 hours. CBG: Recent Labs  Lab 09/20/20 1654  GLUCAP 153*   Lipid Profile: No results for input(s): CHOL, HDL, LDLCALC, TRIG, CHOLHDL, LDLDIRECT in the last 72 hours. Thyroid Function Tests: No results for input(s): TSH, T4TOTAL, FREET4, T3FREE, THYROIDAB in the last 72 hours. Anemia Panel: No results for input(s): VITAMINB12, FOLATE, FERRITIN, TIBC, IRON, RETICCTPCT in the last 72 hours. Sepsis Labs: Recent Labs  Lab 09/20/20 1806 09/23/20 0417  PROCALCITON <0.10 16.85    Recent Results (from the past 240 hour(s))  Resp Panel by RT-PCR (Flu A&B, Covid) Nasopharyngeal Swab     Status: None   Collection Time: 09/18/20 12:34 PM   Specimen: Nasopharyngeal Swab; Nasopharyngeal(NP) swabs in vial transport medium  Result Value Ref Range Status   SARS Coronavirus 2 by RT PCR NEGATIVE NEGATIVE Final    Comment: (NOTE) SARS-CoV-2 target nucleic acids are NOT DETECTED.  The SARS-CoV-2 RNA is generally detectable in upper respiratory specimens during the acute phase of infection. The  lowest concentration of SARS-CoV-2 viral copies this assay can detect is 138 copies/mL. A negative result does not preclude SARS-Cov-2 infection and should not be used as the sole basis for treatment or other patient management decisions. A negative result may occur with  improper specimen collection/handling, submission of specimen other than nasopharyngeal swab, presence of viral mutation(s) within the areas targeted by this assay, and inadequate number of viral copies(<138 copies/mL). A negative result must be combined with clinical observations, patient history, and epidemiological information. The expected result is Negative.  Fact Sheet for Patients:  EntrepreneurPulse.com.au  Fact Sheet for Healthcare Providers:  IncredibleEmployment.be  This test is no t yet approved or cleared by the [place] FDA and  has been authorized for detection and/or diagnosis of SARS-CoV-2 by FDA under an Emergency Use Authorization (EUA). This EUA will remain  in effect (meaning this test can be used) for the duration of the COVID-19 declaration under Section 564(b)(1) of the Act, 21 U.S.C.section 360bbb-3(b)(1), unless the authorization is terminated  or revoked sooner.       Influenza A by PCR NEGATIVE NEGATIVE Final   Influenza B by PCR NEGATIVE NEGATIVE Final    Comment: (NOTE) The Xpert Xpress SARS-CoV-2/FLU/RSV plus assay is intended as an aid in the diagnosis of influenza from Nasopharyngeal swab specimens and should not be used as a sole basis for treatment. Nasal washings and aspirates are unacceptable for Xpert Xpress SARS-CoV-2/FLU/RSV testing.  Fact Sheet for Patients: EntrepreneurPulse.com.au  Fact Sheet for Healthcare Providers: IncredibleEmployment.be  This test is not yet approved or cleared by the [place] FDA and has been authorized for detection and/or diagnosis of SARS-CoV-2 by FDA under  an Emergency Use Authorization (EUA). This EUA will remain in effect (meaning this test can be used) for the duration of the COVID-19 declaration under Section 564(b)(1) of the Act, 21 U.S.C. section 360bbb-3(b)(1), unless the authorization is terminated or revoked.  Performed at [facility], [address] [place], [place] [address]   Urine Culture     Status: None   Collection Time: 09/20/20  1:34 PM   Specimen: Urine, Random  Result Value Ref Range Status   Specimen Description   Final    URINE, RANDOM Performed at [facility], [address] [place], [place] [address]    Special Requests   Final    NONE Performed at [facility], [address] [place], [place] [address]    Culture  Final    NO GROWTH Performed at [facility], [place] [address], [place], [place] [address]    Report Status 09/21/2020 FINAL  Final         Radiology Studies: No results found.      Scheduled Meds:  amLODipine  5 mg Oral QPC supper   apixaban  5 mg Oral BID   aspirin EC  81 mg Oral QPC supper   atorvastatin  40 mg Oral QPC supper   feeding supplement  237 mL Oral TID BM   ferrous gluconate  324 mg Oral Q lunch   fluticasone furoate-vilanterol  1 puff Inhalation Daily   guaiFENesin  600 mg Oral BID   methylPREDNISolone (SOLU-MEDROL) injection  40 mg Intravenous Daily   montelukast  10 mg Oral QHS   pantoprazole  40 mg Oral QPC supper   polyethylene glycol  17 g Oral Daily   senna-docusate  2 tablet Oral BID   Continuous Infusions:  sodium chloride     azithromycin Stopped (09/24/20 0315)   cefTRIAXone (ROCEPHIN)  IV 1 g (09/24/20 1619)   promethazine (PHENERGAN) injection (IM or IVPB) Stopped (09/20/20 0519)     LOS: 5 days    Time spent: 94 minutes    Val Riles, MD [facility]   To contact the attending provider between 7A-7P or the covering provider during after hours 7P-7A, please log into the web site  [email or web address] and access using universal [place] password for that web site. If you do not have the password, please call the hospital operator.  09/24/2020, 4:42 PM

## 2020-09-25 ENCOUNTER — Inpatient Hospital Stay: Payer: Medicare HMO

## 2020-09-25 DIAGNOSIS — J9601 Acute respiratory failure with hypoxia: Secondary | ICD-10-CM | POA: Diagnosis not present

## 2020-09-25 LAB — MRSA NEXT GEN BY PCR, NASAL: MRSA by PCR Next Gen: NOT DETECTED

## 2020-09-25 LAB — BASIC METABOLIC PANEL
Anion gap: 6 (ref 5–15)
BUN: 52 mg/dL — ABNORMAL HIGH (ref 8–23)
CO2: 32 mmol/L (ref 22–32)
Calcium: 9.3 mg/dL (ref 8.9–10.3)
Chloride: 100 mmol/L (ref 98–111)
Creatinine, Ser: 1.12 mg/dL (ref 0.61–1.24)
GFR, Estimated: >60 mL/min (ref >60)
Glucose, Bld: 96 mg/dL (ref 70–99)
Potassium: 4 mmol/L (ref 3.5–5.1)
Sodium: 138 mmol/L (ref 135–145)

## 2020-09-25 LAB — RESPIRATORY PANEL BY PCR

## 2020-09-25 LAB — CBC
HCT: 39.9 % (ref 39.0–52.0)
Hemoglobin: 13.6 g/dL (ref 13.0–17.0)
MCH: 31.3 pg (ref 26.0–34.0)
MCHC: 34.1 g/dL (ref 30.0–36.0)
MCV: 91.9 fL (ref 80.0–100.0)
Platelets: 206 10*3/uL (ref 150–400)
RBC: 4.34 MIL/uL (ref 4.22–5.81)
RDW: 12.4 % (ref 11.5–15.5)
WBC: 13.2 10*3/uL — ABNORMAL HIGH (ref 4.0–10.5)
nRBC: 0 % (ref 0.0–0.2)

## 2020-09-25 LAB — EXPECTORATED SPUTUM ASSESSMENT W GRAM STAIN, RFLX TO RESP C

## 2020-09-25 LAB — PHOSPHORUS: Phosphorus: 4.8 mg/dL — ABNORMAL HIGH (ref 2.5–4.6)

## 2020-09-25 LAB — MAGNESIUM: Magnesium: 2.2 mg/dL (ref 1.7–2.4)

## 2020-09-25 MED ORDER — AZITHROMYCIN 500 MG PO TABS
500.0000 mg | ORAL_TABLET | Freq: Every day | ORAL | Status: DC
  Administered 2020-09-25 – 2020-09-27 (×3): 500 mg via ORAL
  Filled 2020-09-25 (×3): qty 1

## 2020-09-25 MED ORDER — PREDNISONE 20 MG PO TABS: 30.0000 mg | ORAL_TABLET | Freq: Every day | ORAL | Status: DC

## 2020-09-25 MED ORDER — PREDNISONE 10 MG PO TABS: 10.0000 mg | ORAL_TABLET | Freq: Every day | ORAL | Status: DC

## 2020-09-25 MED ORDER — PHENAZOPYRIDINE HCL 200 MG PO TABS
200.0000 mg | ORAL_TABLET | Freq: Three times a day (TID) | ORAL | Status: DC | PRN
  Administered 2020-09-25 – 2020-09-26 (×4): 200 mg via ORAL
  Filled 2020-09-25 (×5): qty 1

## 2020-09-25 MED ORDER — FUROSEMIDE 10 MG/ML IJ SOLN
20.0000 mg | Freq: Two times a day (BID) | INTRAMUSCULAR | Status: AC
  Administered 2020-09-25: 20 mg via INTRAVENOUS
  Filled 2020-09-25: qty 2

## 2020-09-25 MED ORDER — PREDNISONE 20 MG PO TABS
40.0000 mg | ORAL_TABLET | Freq: Every day | ORAL | Status: AC
  Administered 2020-09-26 – 2020-09-28 (×3): 40 mg via ORAL
  Filled 2020-09-25 (×3): qty 2

## 2020-09-25 MED ORDER — AMLODIPINE BESYLATE 5 MG PO TABS
5.0000 mg | ORAL_TABLET | Freq: Every day | ORAL | Status: DC
  Administered 2020-09-26 – 2020-09-27 (×2): 5 mg via ORAL
  Filled 2020-09-25 (×2): qty 1

## 2020-09-25 MED ORDER — PREDNISONE 20 MG PO TABS: 20.0000 mg | ORAL_TABLET | Freq: Every day | ORAL | Status: DC

## 2020-09-25 MED ORDER — FUROSEMIDE 10 MG/ML IJ SOLN
20.0000 mg | Freq: Once | INTRAMUSCULAR | Status: AC
  Administered 2020-09-25: 10:00:00 20 mg via INTRAVENOUS
  Filled 2020-09-25: qty 2

## 2020-09-25 MED ORDER — BELLADONNA ALKALOIDS-OPIUM 16.2-30 MG RE SUPP
1.0000 | Freq: Four times a day (QID) | RECTAL | Status: DC | PRN
Start: 2020-09-25 — End: 2020-09-28
  Filled 2020-09-25: qty 1

## 2020-09-25 NOTE — Care Management Important Message (Signed)
Important Message  Patient Details  Name: KANARD FORS MRN: [id number] Date of Birth: 02/28/1940   Medicare Important Message Given:  Yes     Dannette Barbara 09/25/2020, 11:22 AM

## 2020-09-25 NOTE — Progress Notes (Signed)
PHARMACIST - PHYSICIAN COMMUNICATION DR:   Dwyane Dee CONCERNING: Antibiotic IV to Oral Route Change Policy  RECOMMENDATION: This patient is receiving azithromycin by the intravenous route.  Based on criteria approved by the Pharmacy and Therapeutics Committee, the antibiotic(s) is/are being converted to the equivalent oral dose form(s).   DESCRIPTION: These criteria include: Patient being treated for a respiratory tract infection, urinary tract infection, cellulitis or clostridium difficile associated diarrhea if on metronidazole The patient is not neutropenic and does not exhibit a GI malabsorption state The patient is eating (either orally or via tube) and/or has been taking other orally administered medications for a least 24 hours The patient is improving clinically and has a Tmax < 100.5  If you have questions about this conversion, please contact the Pharmacy Department  '[]'$   [phone] )  [facility] '[x]'$   [phone] )  [facility] '[]'$   [phone] )  [facility] '[]'$   [phone] )  [facility] '[]'$   [phone] )  [facility]

## 2020-09-25 NOTE — Progress Notes (Signed)
PROGRESS NOTE    [name] Browning  [id number] DOB: 1940-07-30 DOA: 09/18/2020 PCP: [place]   Chief complaint.  Shortness of breath. Brief Narrative:  [name] Browning is a 80 y.o. male with medical history significant for COPD, paroxysmal atrial fibrillation on Eliquis, TIA, hyperlipidemia, anxiety and recent umbilical hernia repair who presents with concerns of increasing shortness of breath and left-sided abdominal pain.  Patient is started on steroids for COPD exacerbation.  CT scan also showed left kidney stone with hydronephrosis.  Urology placed left ureteral stent on 9/11. Post procedure he was tachypneic, hypoxic and hypotensive requiring ICU/SD and BiPAP   Assessment & Plan:   Active Problems:   HTN (hypertension)   Hyperlipidemia   COPD exacerbation (HCC)   Acute respiratory failure with hypoxia (HCC)   AF (paroxysmal atrial fibrillation) (HCC)   Urinary tract obstruction by kidney stone   AKI (acute kidney injury) ([place])   Malnutrition of moderate degree  Acute hypoxemic respiratory failure secondary to COPD exacerbation. COPD exacerbation. Post op atelectasis with interstitial edema requiring BiPAP and diuresis with lasix  Patient is still requiring 4 L oxygen via nasal cannula. S/p  Lasix 40 mg IV daily and s/p Solu-Medrol 40 mg IV daily Net IO Since Admission: -10,742.3 mL [09/25/20 1509]  Procalcitonin less than 0.1 so antibiotics stopped.  Was given dose of Unasyn for possible aspiration.   9/12 CXR Stable airspace opacities bilaterally. No new focal abnormality is noted. 9/14 started Breo Ellipta inhaler, as patient was significantly wheezing 9/15 Lasix 20 mg IV x1 dose ordered 9/16 Lasix 20 mg IV bid x 2 doses, will reassess tomorrow a.m. and monitor renal functions 9/17 start prednisone tapering dose  # Community-acquired pneumonia, chest x-ray shows airspace opacity, WBC count elevated, it could be due to steroids but procalcitonin is also elevated,  patient still symptomatic, requiring supplemental O2 inhalation Empirically started ceftriaxone azithromycin Started Mucinex twice daily, continue Robitussin-DM as needed   Obstructing left proximal ureter stone with left-sided hydronephrosis Acute kidney injury secondary to obstruction.  CKD stage II S/p left ureteral stent by urology on 9/11 Kidney function back to baseline now Urine culture had no growth.  Antibiotics stopped Patient was complaining of severe pain while micturition, started Pyridium 200 mg p.o. 3 times daily as needed 9/16 AXR: 1. Left ureteral stent in place. 8 mm calculus in the proximal left ureter no longer identified Follow renal sonogram   Paroxysmal atrial fibrillation. Continue Eliquis  Patient had an episode of hemoptysis, continue to watch  H&H remained stable  Hypotension with a history of essential hypertension. Holding losartan and amlodipine due to hypotension 9/14 BP improved, resume amlodipine 5 mg p.o. on 9/15 with holding parameters We will continue monitor BP and titrate medications accordingly  Constipation.  Resolved with bowel regimen  AKI most likely due to dehydration. Continue oral hydration, creatinine improved  Continue monitor renal functions and urine output, avoid nephrotoxic medications.   DVT prophylaxis: SCDs, Eliquis. Code Status: full Family Communication: None today Disposition Plan:    Status is: Inpatient  Remains inpatient appropriate because:Ongoing diagnostic testing needed not appropriate for outpatient work up, IV treatments appropriate due to intensity of illness or inability to take PO, and Inpatient level of care appropriate due to severity of illness  Dispo: The patient is from: Home              Anticipated d/c is to: Home  Patient currently is not medically stable to d/c.  Still hypoxic on supplemental O2 inhalation, may need 1 to 2 days more   Difficult to place patient No    I/O last 3  completed shifts: In: 76 [P.O.:720; IV Piggyback:350] Out: [id number] [Urine:5340] Total I/O In: 600 [P.O.:600] Out: 400 [Urine:400]     Consultants:  Urology PCCM  Procedures: Cystoscopy with left ureteral stent placement on 09/20/2020  Antimicrobials: none.  He had received dose of Unasyn and also Rocephin which are stopped now                             Subjective: No significant overnight events, patient feels improvement in the breathing, supplemental ventilation weaned down to 2 L today.  Patient denied any active issues at that time the morning.  Later on patient was complaining of dysuria and urology was informed, we will have started Pyridium.   Objective: Vitals:   09/25/20 0815 09/25/20 0824 09/25/20 1108 09/25/20 1123  BP:   118/75   Pulse: 80  65   Resp:    20  Temp:   97.8 F (36.6 C)   TempSrc:      SpO2: 93% 92% 95%   Weight:      Height:        Intake/Output Summary (Last 24 hours) at 09/25/2020 1509 Last data filed at 09/25/2020 1413 Gross per 24 hour  Intake 600 ml  Output 3225 ml  Net -2625 ml   Filed Weights   09/18/20 0926 09/18/20 2023  Weight: 80.7 kg 77.4 kg    Examination:  General exam: Appears calm and comfortable  Respiratory system: Mild bibasilar crackles and mild wheezing bilaterally.   Cardiovascular system: S1 & S2 heard, RRR. No JVD, murmurs, rubs, gallops or clicks. No pedal edema. Gastrointestinal system: Abdomen is nondistended, soft and nontender. No organomegaly or masses felt. Normal bowel sounds heard. Central nervous system: Alert and oriented. No focal neurological deficits. Extremities: Symmetric 5 x 5 power. Skin: No rashes, lesions or ulcers Psychiatry: Judgement and insight appear normal. Mood & affect appropriate.     Data Reviewed: I have personally reviewed following labs and imaging studies  CBC: Recent Labs  Lab 09/20/20 0418 09/21/20 0428 09/22/20 0347 09/23/20 0417 09/24/20 0430 09/25/20 0738  WBC  20.7* 7.6 9.4 11.2* 12.6* 13.2*  NEUTROABS 18.6* 6.7  --   --   --   --   HGB 13.3 13.8 12.6* 12.0* 12.3* 13.6  HCT 41.0 41.9 39.5 37.1* 36.4* 39.9  MCV 94.0 95.7 93.6 92.5 93.6 91.9  PLT 191 173 148* 172 184 [id number]   Basic Metabolic Panel: Recent Labs  Lab 09/20/20 0418 09/21/20 0428 09/22/20 0347 09/24/20 0430 09/25/20 0738  NA 138 141 138 137 138  K 4.9 4.1 3.9 4.3 4.0  CL 105 104 100 99 100  CO2 '25 27 31 30 '$ 32  GLUCOSE 135* 115* 133* 124* 96  BUN 31* 36* 50* 46* 52*  CREATININE 1.31* 1.46* 1.41* 1.05 1.12  CALCIUM 9.3 8.5* 8.8* 9.2 9.3  MG 2.3 1.8  --  2.2 2.2  PHOS  --   --   --  4.2 4.8*   GFR: Estimated Creatinine Clearance: 58.5 mL/min (by C-G formula based on SCr of 1.12 mg/dL). Liver Function Tests: No results for input(s): AST, ALT, ALKPHOS, BILITOT, PROT, ALBUMIN in the last 168 hours.  No results for input(s): LIPASE, AMYLASE in the last 168  hours.  No results for input(s): AMMONIA in the last 168 hours. Coagulation Profile: No results for input(s): INR, PROTIME in the last 168 hours. Cardiac Enzymes: No results for input(s): CKTOTAL, CKMB, CKMBINDEX, TROPONINI in the last 168 hours. BNP (last 3 results) No results for input(s): PROBNP in the last 8760 hours. HbA1C: No results for input(s): HGBA1C in the last 72 hours. CBG: Recent Labs  Lab 09/20/20 1654  GLUCAP 153*   Lipid Profile: No results for input(s): CHOL, HDL, LDLCALC, TRIG, CHOLHDL, LDLDIRECT in the last 72 hours. Thyroid Function Tests: No results for input(s): TSH, T4TOTAL, FREET4, T3FREE, THYROIDAB in the last 72 hours. Anemia Panel: No results for input(s): VITAMINB12, FOLATE, FERRITIN, TIBC, IRON, RETICCTPCT in the last 72 hours. Sepsis Labs: Recent Labs  Lab 09/20/20 1806 09/23/20 0417  PROCALCITON <0.10 16.85    Recent Results (from the past 240 hour(s))  Resp Panel by RT-PCR (Flu A&B, Covid) Nasopharyngeal Swab     Status: None   Collection Time: 09/18/20 12:34 PM    Specimen: Nasopharyngeal Swab; Nasopharyngeal(NP) swabs in vial transport medium  Result Value Ref Range Status   SARS Coronavirus 2 by RT PCR NEGATIVE NEGATIVE Final    Comment: (NOTE) SARS-CoV-2 target nucleic acids are NOT DETECTED.  The SARS-CoV-2 RNA is generally detectable in upper respiratory specimens during the acute phase of infection. The lowest concentration of SARS-CoV-2 viral copies this assay can detect is 138 copies/mL. A negative result does not preclude SARS-Cov-2 infection and should not be used as the sole basis for treatment or other patient management decisions. A negative result may occur with  improper specimen collection/handling, submission of specimen other than nasopharyngeal swab, presence of viral mutation(s) within the areas targeted by this assay, and inadequate number of viral copies(<138 copies/mL). A negative result must be combined with clinical observations, patient history, and epidemiological information. The expected result is Negative.  Fact Sheet for Patients:  EntrepreneurPulse.com.au  Fact Sheet for Healthcare Providers:  IncredibleEmployment.be  This test is no t yet approved or cleared by the [place] FDA and  has been authorized for detection and/or diagnosis of SARS-CoV-2 by FDA under an Emergency Use Authorization (EUA). This EUA will remain  in effect (meaning this test can be used) for the duration of the COVID-19 declaration under Section 564(b)(1) of the Act, 21 U.S.C.section 360bbb-3(b)(1), unless the authorization is terminated  or revoked sooner.       Influenza A by PCR NEGATIVE NEGATIVE Final   Influenza B by PCR NEGATIVE NEGATIVE Final    Comment: (NOTE) The Xpert Xpress SARS-CoV-2/FLU/RSV plus assay is intended as an aid in the diagnosis of influenza from Nasopharyngeal swab specimens and should not be used as a sole basis for treatment. Nasal washings and aspirates are  unacceptable for Xpert Xpress SARS-CoV-2/FLU/RSV testing.  Fact Sheet for Patients: EntrepreneurPulse.com.au  Fact Sheet for Healthcare Providers: IncredibleEmployment.be  This test is not yet approved or cleared by the [place] FDA and has been authorized for detection and/or diagnosis of SARS-CoV-2 by FDA under an Emergency Use Authorization (EUA). This EUA will remain in effect (meaning this test can be used) for the duration of the COVID-19 declaration under Section 564(b)(1) of the Act, 21 U.S.C. section 360bbb-3(b)(1), unless the authorization is terminated or revoked.  Performed at [facility], [address], [place], [place] [address]   Urine Culture     Status: None   Collection Time: 09/20/20  1:34 PM   Specimen: Urine, Random  Result Value Ref Range Status   Specimen Description   Final    URINE, RANDOM Performed at [facility], [address], [place], [place] [address]    Special Requests   Final    NONE Performed at [facility], [address] [place], [place] [address]    Culture   Final    NO GROWTH Performed at [facility], [place] [address] [place], [place] [address]    Report Status 09/21/2020 FINAL  Final         Radiology Studies: DG Abd 1 View  Result Date: 09/25/2020 CLINICAL DATA:  Left-sided flank pain. EXAM: ABDOMEN - 1 VIEW COMPARISON:  Intraoperative fluoroscopic images dated September 20, 2020. CT dated September 18, 2020. FINDINGS: Left ureteral stent in place. 8 mm calculus in the proximal left ureter no longer identified. Unchanged left renal calculi. Nonobstructive bowel gas pattern. IMPRESSION: 1. Left ureteral stent in place. 8 mm calculus in the proximal left ureter no longer identified. Electronically Signed   By: Titus Dubin M.D.   On: 09/25/2020 13:23        Scheduled Meds:  [START ON 09/26/2020] amLODipine  5 mg Oral QPC supper   apixaban  5  mg Oral BID   aspirin EC  81 mg Oral QPC supper   atorvastatin  40 mg Oral QPC supper   azithromycin  500 mg Oral Daily   feeding supplement  237 mL Oral TID BM   ferrous gluconate  324 mg Oral Q lunch   fluticasone furoate-vilanterol  1 puff Inhalation Daily   guaiFENesin  600 mg Oral BID   methylPREDNISolone (SOLU-MEDROL) injection  40 mg Intravenous Daily   montelukast  10 mg Oral QHS   pantoprazole  40 mg Oral QPC supper   polyethylene glycol  17 g Oral Daily   senna-docusate  2 tablet Oral BID   Continuous Infusions:  sodium chloride     cefTRIAXone (ROCEPHIN)  IV 1 g (09/24/20 1619)   promethazine (PHENERGAN) injection (IM or IVPB) Stopped (09/20/20 0519)     LOS: 6 days    Time spent: 32 minutes    Val Riles, MD [facility]   To contact the attending provider between 7A-7P or the covering provider during after hours 7P-7A, please log into the web site [email or web address] and access using universal [place] password for that web site. If you do not have the password, please call the hospital operator.  09/25/2020, 3:09 PM

## 2020-09-25 NOTE — Progress Notes (Signed)
SATURATION QUALIFICATIONS: (This note is used to comply with regulatory documentation for home oxygen)   Patient Saturations on Room Air while Ambulating = 78%  Patient Saturations at rest on 2L- 93%

## 2020-09-25 NOTE — Progress Notes (Signed)
Physical Therapy Treatment Patient Details Name: [name] Browning MRN: [id number] DOB: 12/19/1940 Today's Date: 09/25/2020   History of Present Illness Pt is a 80 y.o. male with medical history significant for COPD, paroxysmal atrial fibrillation, TIA, hyperlipidemia, anxiety and recent umbilical hernia repair who presents with concerns of increasing shortness of breath and left-sided abdominal pain. MD assessment includes: Acute hypoxemic respiratory failure secondary to COPD exacerbation, obstructing left proximal ureter stone with left-sided hydronephrosis s/p ureteral stent placement 9/11, AKI, and hypotension.    PT Comments    Pt fatigued after using bathroom with nursing. Received resting in bed on 2L O2 sats between 89-91% at rest. Pt has declined HHPT services, therefore pt given B LE HEP and energy conservation techniques/handout for safe transition home and continued strengthening. Pt stated good understanding.  Continue PT per POC.    Recommendations for follow up therapy are one component of a multi-disciplinary discharge planning process, led by the attending physician.  Recommendations may be updated based on patient status, additional functional criteria and insurance authorization.  Follow Up Recommendations  Home health PT;Supervision for mobility/OOB     Equipment Recommendations  Rolling walker with 5" wheels    Recommendations for Other Services       Precautions / Restrictions Precautions Precautions: Fall Restrictions Weight Bearing Restrictions: No Other Position/Activity Restrictions: Watch SpO2 with ambulation     Mobility  Bed Mobility                    Transfers                    Ambulation/Gait                 Stairs             Wheelchair Mobility    Modified Rankin (Stroke Patients Only)       Balance                                            Cognition Arousal/Alertness:  Awake/alert Behavior During Therapy: WFL for tasks assessed/performed Overall Cognitive Status: Within Functional Limits for tasks assessed                                 General Comments:  (fatigued, declining OOB)      Exercises      General Comments General comments (skin integrity, edema, etc.):  (Pt on 2L O2 with sats at 89-91% at rest)      Pertinent Vitals/Pain Pain Assessment: No/denies pain    Home Living                      Prior Function            PT Goals (current goals can now be found in the care plan section) Acute Rehab PT Goals Patient Stated Goal: to return to mowing lawn    Frequency    Min 2X/week      PT Plan Current plan remains appropriate    Co-evaluation              AM-PAC PT "6 Clicks" Mobility   Outcome Measure  Help needed turning from your back to your side while in a flat bed without using bedrails?: None Help needed  moving from lying on your back to sitting on the side of a flat bed without using bedrails?: None Help needed moving to and from a bed to a chair (including a wheelchair)?: A Little Help needed standing up from a chair using your arms (e.g., wheelchair or bedside chair)?: A Little Help needed to walk in hospital room?: A Little Help needed climbing 3-5 steps with a railing? : A Little 6 Click Score: 20    End of Session Equipment Utilized During Treatment: Oxygen Activity Tolerance: Patient tolerated treatment well Patient left: in bed;with call bell/phone within reach;with bed alarm set Nurse Communication: Mobility status;Other (comment) PT Visit Diagnosis: Difficulty in walking, not elsewhere classified (R26.2);Muscle weakness (generalized) (M62.81)     Time: [id number] PT Time Calculation (min) (ACUTE ONLY): 18 min  Charges:  $Therapeutic Exercise: 8-22 mins                    Mikel Cella, PTA   Josie Dixon 09/25/2020, 3:15 PM

## 2020-09-26 DIAGNOSIS — J9601 Acute respiratory failure with hypoxia: Secondary | ICD-10-CM | POA: Diagnosis not present

## 2020-09-26 LAB — BASIC METABOLIC PANEL
Anion gap: 7 (ref 5–15)
BUN: 55 mg/dL — ABNORMAL HIGH (ref 8–23)
CO2: 34 mmol/L — ABNORMAL HIGH (ref 22–32)
Calcium: 9.1 mg/dL (ref 8.9–10.3)
Chloride: 94 mmol/L — ABNORMAL LOW (ref 98–111)
Creatinine, Ser: 1.04 mg/dL (ref 0.61–1.24)
GFR, Estimated: >60 mL/min (ref >60)
Glucose, Bld: 105 mg/dL — ABNORMAL HIGH (ref 70–99)
Potassium: 4.3 mmol/L (ref 3.5–5.1)
Sodium: 135 mmol/L (ref 135–145)

## 2020-09-26 LAB — CBC
HCT: 40.9 % (ref 39.0–52.0)
Hemoglobin: 14 g/dL (ref 13.0–17.0)
MCH: 31.3 pg (ref 26.0–34.0)
MCHC: 34.2 g/dL (ref 30.0–36.0)
MCV: 91.3 fL (ref 80.0–100.0)
Platelets: 226 10*3/uL (ref 150–400)
RBC: 4.48 MIL/uL (ref 4.22–5.81)
RDW: 12.5 % (ref 11.5–15.5)
WBC: 17.3 10*3/uL — ABNORMAL HIGH (ref 4.0–10.5)
nRBC: 0 % (ref 0.0–0.2)

## 2020-09-26 LAB — PHOSPHORUS: Phosphorus: 4.6 mg/dL (ref 2.5–4.6)

## 2020-09-26 LAB — MAGNESIUM: Magnesium: 2.3 mg/dL (ref 1.7–2.4)

## 2020-09-26 MED ORDER — FUROSEMIDE 10 MG/ML IJ SOLN
20.0000 mg | Freq: Two times a day (BID) | INTRAMUSCULAR | Status: AC
  Administered 2020-09-26 (×2): 20 mg via INTRAVENOUS
  Filled 2020-09-26 (×2): qty 2

## 2020-09-26 NOTE — Progress Notes (Signed)
PROGRESS NOTE    ROBLEY DUTTON  [id number] DOB: 05-10-1940 DOA: 09/18/2020 PCP: [place]   Chief complaint.  Shortness of breath. Brief Narrative:  [name] Browning is a 80 y.o. male with medical history significant for COPD, paroxysmal atrial fibrillation on Eliquis, TIA, hyperlipidemia, anxiety and recent umbilical hernia repair who presents with concerns of increasing shortness of breath and left-sided abdominal pain.  Patient is started on steroids for COPD exacerbation.  CT scan also showed left kidney stone with hydronephrosis.  Urology placed left ureteral stent on 9/11. Post procedure he was tachypneic, hypoxic and hypotensive requiring ICU/SD and BiPAP   Assessment & Plan:   Active Problems:   HTN (hypertension)   Hyperlipidemia   COPD exacerbation (HCC)   Acute respiratory failure with hypoxia (HCC)   AF (paroxysmal atrial fibrillation) (HCC)   Urinary tract obstruction by kidney stone   AKI (acute kidney injury) ([place])   Malnutrition of moderate degree  Acute hypoxemic respiratory failure secondary to COPD exacerbation. COPD exacerbation. Post op atelectasis with interstitial edema requiring BiPAP and diuresis with lasix  Patient is still requiring 4 L oxygen via nasal cannula. S/p  Lasix 40 mg IV daily and s/p Solu-Medrol 40 mg IV daily Net IO Since Admission: -12,177.23 mL [09/26/20 1550]  Procalcitonin less than 0.1 so antibiotics stopped.  Was given dose of Unasyn for possible aspiration.   9/12 CXR Stable airspace opacities bilaterally. No new focal abnormality is noted. 9/14 started Breo Ellipta inhaler, as patient was significantly wheezing 9/15 Lasix 20 mg IV x1 dose ordered 9/16 Lasix 20 mg IV bid x 2 days, will reassess tomorrow a.m. and monitor renal functions 9/17 started prednisone tapering dose  # Community-acquired pneumonia, chest x-ray shows airspace opacity, WBC count elevated, it could be due to steroids but procalcitonin is also elevated,  patient still symptomatic, requiring supplemental O2 inhalation Empirically started ceftriaxone and azithromycin Started Mucinex twice daily, continue Robitussin-DM as needed   Obstructing left proximal ureter stone with left-sided hydronephrosis Acute kidney injury secondary to obstruction.  CKD stage II S/p left ureteral stent by urology on 9/11 Kidney function back to baseline now Urine culture had no growth.  Antibiotics stopped Patient was complaining of severe pain while micturition, started Pyridium 200 mg p.o. 3 times daily as needed 9/16 AXR: 1. Left ureteral stent in place. 8 mm calculus in the proximal left ureter no longer identified US renal: Multiple left renal stones measuring up to 9 mm. Previously seen hydronephrosis has resolved. Patient feels improvement after Pyridium, feels less discomfort while urination.   Paroxysmal atrial fibrillation. Continue Eliquis  Patient had an episode of hemoptysis, continue to watch  H&H remained stable  Hypotension with a history of essential hypertension. Holding losartan and amlodipine due to hypotension 9/14 BP improved, resume amlodipine 5 mg p.o. on 9/15 with holding parameters We will continue monitor BP and titrate medications accordingly  Constipation.  Resolved with bowel regimen  AKI most likely due to dehydration. Continue oral hydration, creatinine improved  Continue monitor renal functions and urine output, avoid nephrotoxic medications.   DVT prophylaxis: SCDs, Eliquis. Code Status: full Family Communication: None today Disposition Plan:    Status is: Inpatient  Remains inpatient appropriate because:Ongoing diagnostic testing needed not appropriate for outpatient work up, IV treatments appropriate due to intensity of illness or inability to take PO, and Inpatient level of care appropriate due to severity of illness  Dispo: The patient is from: Home  Anticipated d/c is to: Home               Patient currently is not medically stable to d/c.  Still hypoxic on supplemental O2 inhalation, may need 1 to 2 days more   Difficult to place patient No    I/O last 3 completed shifts: In: 1580.1 [P.O.:1380; IV Piggyback:200.1] Out: 2150 [Urine:2150] Total I/O In: 0  Out: Dexter [Urine:1315]     Consultants:  Urology PCCM  Procedures: Cystoscopy with left ureteral stent placement on 09/20/2020  Antimicrobials: none.  He had received dose of Unasyn and also Rocephin which are stopped now                             Subjective: No significant overnight events, patient feels improvement in the breathing, but is still requiring 2 L oxygen via nasal cannula.  Patient states that dysuria is improving after medication but is still it hurts when he pees.  Denies any other active issues.    Objective: Vitals:   09/26/20 0005 09/26/20 0524 09/26/20 0800 09/26/20 1212  BP: 122/72 129/80 132/87 118/72  Pulse: 80 87 76 69  Resp: '20 16 17 16  '$ Temp: 98 F (36.7 C) (!) 97.4 F (36.3 C) (!) 97.5 F (36.4 C) 98.1 F (36.7 C)  TempSrc: Oral Oral Oral Oral  SpO2: 95% 98% 94% 97%  Weight:      Height:        Intake/Output Summary (Last 24 hours) at 09/26/2020 1550 Last data filed at 09/26/2020 1300 Gross per 24 hour  Intake 980.07 ml  Output 2415 ml  Net -1434.93 ml   Filed Weights   09/18/20 0926 09/18/20 2023  Weight: 80.7 kg 77.4 kg    Examination:  General exam: Appears calm and comfortable  Respiratory system: Mild bibasilar crackles and mild wheezing bilaterally.   Cardiovascular system: S1 & S2 heard, RRR. No JVD, murmurs, rubs, gallops or clicks. No pedal edema. Gastrointestinal system: Abdomen is nondistended, soft and nontender. No organomegaly or masses felt. Normal bowel sounds heard. Central nervous system: Alert and oriented. No focal neurological deficits. Extremities: Symmetric 5 x 5 power. Skin: No rashes, lesions or ulcers Psychiatry: Judgement and insight  appear normal. Mood & affect appropriate.     Data Reviewed: I have personally reviewed following labs and imaging studies  CBC: Recent Labs  Lab 09/20/20 0418 09/21/20 0428 09/22/20 0347 09/23/20 0417 09/24/20 0430 09/25/20 0738 09/26/20 0525  WBC 20.7* 7.6 9.4 11.2* 12.6* 13.2* 17.3*  NEUTROABS 18.6* 6.7  --   --   --   --   --   HGB 13.3 13.8 12.6* 12.0* 12.3* 13.6 14.0  HCT 41.0 41.9 39.5 37.1* 36.4* 39.9 40.9  MCV 94.0 95.7 93.6 92.5 93.6 91.9 91.3  PLT 191 173 148* 172 184 206 [id number]   Basic Metabolic Panel: Recent Labs  Lab 09/20/20 0418 09/21/20 0428 09/22/20 0347 09/24/20 0430 09/25/20 0738 09/26/20 0525  NA 138 141 138 137 138 135  K 4.9 4.1 3.9 4.3 4.0 4.3  CL 105 104 100 99 100 94*  CO2 '25 27 31 30 '$ 32 34*  GLUCOSE 135* 115* 133* 124* 96 105*  BUN 31* 36* 50* 46* 52* 55*  CREATININE 1.31* 1.46* 1.41* 1.05 1.12 1.04  CALCIUM 9.3 8.5* 8.8* 9.2 9.3 9.1  MG 2.3 1.8  --  2.2 2.2 2.3  PHOS  --   --   --  4.2 4.8* 4.6   GFR: Estimated Creatinine Clearance: 63.1 mL/min (by C-G formula based on SCr of 1.04 mg/dL). Liver Function Tests: No results for input(s): AST, ALT, ALKPHOS, BILITOT, PROT, ALBUMIN in the last 168 hours.  No results for input(s): LIPASE, AMYLASE in the last 168 hours.  No results for input(s): AMMONIA in the last 168 hours. Coagulation Profile: No results for input(s): INR, PROTIME in the last 168 hours. Cardiac Enzymes: No results for input(s): CKTOTAL, CKMB, CKMBINDEX, TROPONINI in the last 168 hours. BNP (last 3 results) No results for input(s): PROBNP in the last 8760 hours. HbA1C: No results for input(s): HGBA1C in the last 72 hours. CBG: Recent Labs  Lab 09/20/20 1654  GLUCAP 153*   Lipid Profile: No results for input(s): CHOL, HDL, LDLCALC, TRIG, CHOLHDL, LDLDIRECT in the last 72 hours. Thyroid Function Tests: No results for input(s): TSH, T4TOTAL, FREET4, T3FREE, THYROIDAB in the last 72 hours. Anemia Panel: No results  for input(s): VITAMINB12, FOLATE, FERRITIN, TIBC, IRON, RETICCTPCT in the last 72 hours. Sepsis Labs: Recent Labs  Lab 09/20/20 1806 09/23/20 0417  PROCALCITON <0.10 16.85    Recent Results (from the past 240 hour(s))  Resp Panel by RT-PCR (Flu A&B, Covid) Nasopharyngeal Swab     Status: None   Collection Time: 09/18/20 12:34 PM   Specimen: Nasopharyngeal Swab; Nasopharyngeal(NP) swabs in vial transport medium  Result Value Ref Range Status   SARS Coronavirus 2 by RT PCR NEGATIVE NEGATIVE Final    Comment: (NOTE) SARS-CoV-2 target nucleic acids are NOT DETECTED.  The SARS-CoV-2 RNA is generally detectable in upper respiratory specimens during the acute phase of infection. The lowest concentration of SARS-CoV-2 viral copies this assay can detect is 138 copies/mL. A negative result does not preclude SARS-Cov-2 infection and should not be used as the sole basis for treatment or other patient management decisions. A negative result may occur with  improper specimen collection/handling, submission of specimen other than nasopharyngeal swab, presence of viral mutation(s) within the areas targeted by this assay, and inadequate number of viral copies(<138 copies/mL). A negative result must be combined with clinical observations, patient history, and epidemiological information. The expected result is Negative.  Fact Sheet for Patients:  EntrepreneurPulse.com.au  Fact Sheet for Healthcare Providers:  IncredibleEmployment.be  This test is no t yet approved or cleared by the [place] FDA and  has been authorized for detection and/or diagnosis of SARS-CoV-2 by FDA under an Emergency Use Authorization (EUA). This EUA will remain  in effect (meaning this test can be used) for the duration of the COVID-19 declaration under Section 564(b)(1) of the Act, 21 U.S.C.section 360bbb-3(b)(1), unless the authorization is terminated  or revoked sooner.        Influenza A by PCR NEGATIVE NEGATIVE Final   Influenza B by PCR NEGATIVE NEGATIVE Final    Comment: (NOTE) The Xpert Xpress SARS-CoV-2/FLU/RSV plus assay is intended as an aid in the diagnosis of influenza from Nasopharyngeal swab specimens and should not be used as a sole basis for treatment. Nasal washings and aspirates are unacceptable for Xpert Xpress SARS-CoV-2/FLU/RSV testing.  Fact Sheet for Patients: EntrepreneurPulse.com.au  Fact Sheet for Healthcare Providers: IncredibleEmployment.be  This test is not yet approved or cleared by the [place] FDA and has been authorized for detection and/or diagnosis of SARS-CoV-2 by FDA under an Emergency Use Authorization (EUA). This EUA will remain in effect (meaning this test can be used) for the duration of the COVID-19 declaration under Section 564(b)(1) of the  Act, 21 U.S.C. section 360bbb-3(b)(1), unless the authorization is terminated or revoked.  Performed at [facility], [address], [place], [place] [address]   Urine Culture     Status: None   Collection Time: 09/20/20  1:34 PM   Specimen: Urine, Random  Result Value Ref Range Status   Specimen Description   Final    URINE, RANDOM Performed at [facility], [address], [place], [place] [address]    Special Requests   Final    NONE Performed at [facility], [address] [place] [address] [place], [place] [address]    Culture   Final    NO GROWTH Performed at [facility], [place] [address] [place], [place] [address]    Report Status 09/21/2020 FINAL  Final  Respiratory (~20 pathogens) panel by PCR     Status: None   Collection Time: 09/25/20  1:33 PM   Specimen: Nasopharyngeal Swab; Respiratory  Result Value Ref Range Status   Adenovirus NOT DETECTED NOT DETECTED Final   Coronavirus 229E NOT DETECTED NOT DETECTED Final    Comment: (NOTE) The Coronavirus on the Respiratory Panel, DOES NOT test  for the novel  Coronavirus (2019 nCoV)    Coronavirus HKU1 NOT DETECTED NOT DETECTED Final   Coronavirus NL63 NOT DETECTED NOT DETECTED Final   Coronavirus OC43 NOT DETECTED NOT DETECTED Final   Metapneumovirus NOT DETECTED NOT DETECTED Final   Rhinovirus / Enterovirus NOT DETECTED NOT DETECTED Final   Influenza A NOT DETECTED NOT DETECTED Final   Influenza B NOT DETECTED NOT DETECTED Final   Parainfluenza Virus 1 NOT DETECTED NOT DETECTED Final   Parainfluenza Virus 2 NOT DETECTED NOT DETECTED Final   Parainfluenza Virus 3 NOT DETECTED NOT DETECTED Final   Parainfluenza Virus 4 NOT DETECTED NOT DETECTED Final   Respiratory Syncytial Virus NOT DETECTED NOT DETECTED Final   Bordetella pertussis NOT DETECTED NOT DETECTED Final   Bordetella Parapertussis NOT DETECTED NOT DETECTED Final   Chlamydophila pneumoniae NOT DETECTED NOT DETECTED Final   Mycoplasma pneumoniae NOT DETECTED NOT DETECTED Final    Comment: Performed at [facility], [place] [address], [place], [place] [address]  MRSA Next Gen by PCR, Nasal     Status: None   Collection Time: 09/25/20  1:34 PM   Specimen: Nasal Mucosa; Nasal Swab  Result Value Ref Range Status   MRSA by PCR Next Gen NOT DETECTED NOT DETECTED Final    Comment: (NOTE) The GeneXpert MRSA Assay (FDA approved for NASAL specimens only), is one component of a comprehensive MRSA colonization surveillance program. It is not intended to diagnose MRSA infection nor to guide or monitor treatment for MRSA infections. Test performance is not FDA approved in patients less than 72 years old. Performed at [facility], [address] [place], [place] [address]   Expectorated Sputum Assessment w Gram Stain, Rflx to Resp Cult     Status: None   Collection Time: 09/25/20  1:34 PM   Specimen: Expectorated Sputum  Result Value Ref Range Status   Specimen Description EXPECTORATED SPUTUM  Final   Special Requests NONE  Final   Sputum evaluation   Final     THIS SPECIMEN IS ACCEPTABLE FOR SPUTUM CULTURE Performed at [facility], [address] [place],  [address]    Report Status 09/25/2020 FINAL  Final  Culture, Respiratory w Gram Stain     Status: None (Preliminary result)   Collection Time: 09/25/20  1:34  PM  Result Value Ref Range Status   Specimen Description   Final    EXPECTORATED SPUTUM Performed at [facility], [address] [place], [place] [address]    Special Requests   Final    NONE Reflexed from [id number] Performed at [facility], [place], [place] [address]    Gram Stain   Final    FEW SQUAMOUS EPITHELIAL CELLS PRESENT RARE WBC PRESENT,BOTH PMN AND MONONUCLEAR MODERATE GRAM POSITIVE COCCI IN PAIRS AND CHAINS    Culture   Final    CULTURE REINCUBATED FOR BETTER GROWTH Performed at [facility], [place] [address], [place], [place] [address]    Report Status PENDING  Incomplete         Radiology Studies: DG Abd 1 View  Result Date: 09/25/2020 CLINICAL DATA:  Left-sided flank pain. EXAM: ABDOMEN - 1 VIEW COMPARISON:  Intraoperative fluoroscopic images dated September 20, 2020. CT dated September 18, 2020. FINDINGS: Left ureteral stent in place. 8 mm calculus in the proximal left ureter no longer identified. Unchanged left renal calculi. Nonobstructive bowel gas pattern. IMPRESSION: 1. Left ureteral stent in place. 8 mm calculus in the proximal left ureter no longer identified. Electronically Signed   By: Titus Dubin M.D.   On: 09/25/2020 13:23   US RENAL  Result Date: 09/26/2020 CLINICAL DATA:  Flank pain EXAM: RENAL / URINARY TRACT ULTRASOUND COMPLETE COMPARISON:  CT abdomen/pelvis 09/18/2020 FINDINGS: Right Kidney: Renal measurements: 10.0 cm x 5.1 cm x 4.7 cm = volume: 126 mL. The cortex is thinned with no focal abnormality. No shadowing stone is seen. There is no hydronephrosis. Left Kidney: Renal measurements: 10.7 cm x 5.0 cm x 5.3 cm = volume: 151  mL. The cortex is thinned. Multiple shadowing stones are seen, the largest measuring up to 9 mm. There is no hydronephrosis. The ureteral stent was not identified. Bladder: The bladder is unremarkable, but the ureteral jets were not identified. Other: None. IMPRESSION: Multiple left renal stones measuring up to 9 mm. Previously seen hydronephrosis has resolved. Electronically Signed   By: Valetta Mole M.D.   On: 09/26/2020 08:44        Scheduled Meds:  amLODipine  5 mg Oral QPC supper   apixaban  5 mg Oral BID   aspirin EC  81 mg Oral QPC supper   atorvastatin  40 mg Oral QPC supper   azithromycin  500 mg Oral Daily   feeding supplement  237 mL Oral TID BM   ferrous gluconate  324 mg Oral Q lunch   fluticasone furoate-vilanterol  1 puff Inhalation Daily   furosemide  20 mg Intravenous BID   guaiFENesin  600 mg Oral BID   montelukast  10 mg Oral QHS   pantoprazole  40 mg Oral QPC supper   polyethylene glycol  17 g Oral Daily   predniSONE  40 mg Oral Q breakfast   Followed by   Derrill Memo ON 09/29/2020] predniSONE  30 mg Oral Q breakfast   Followed by   Derrill Memo ON 10/02/2020] predniSONE  20 mg Oral Q breakfast   Followed by   Derrill Memo ON 10/05/2020] predniSONE  10 mg Oral Q breakfast   senna-docusate  2 tablet Oral BID   Continuous Infusions:  sodium chloride     cefTRIAXone (ROCEPHIN)  IV Stopped (09/25/20 1912)   promethazine (PHENERGAN) injection (IM or IVPB) Stopped (09/20/20 0519)     LOS: 7 days    Time spent: 32 minutes  Val Riles, MD [facility]   To contact the attending provider between 7A-7P or the covering provider during after hours 7P-7A, please log into the web site [email or web address] and access using universal [place] password for that web site. If you do not have the password, please call the hospital operator.  09/26/2020, 3:50 PM

## 2020-09-27 ENCOUNTER — Inpatient Hospital Stay: Payer: Medicare HMO

## 2020-09-27 DIAGNOSIS — J9601 Acute respiratory failure with hypoxia: Secondary | ICD-10-CM | POA: Diagnosis not present

## 2020-09-27 LAB — BASIC METABOLIC PANEL
Anion gap: 9 (ref 5–15)
BUN: 61 mg/dL — ABNORMAL HIGH (ref 8–23)
CO2: 32 mmol/L (ref 22–32)
Calcium: 8.9 mg/dL (ref 8.9–10.3)
Chloride: 93 mmol/L — ABNORMAL LOW (ref 98–111)
Creatinine, Ser: 0.96 mg/dL (ref 0.61–1.24)
GFR, Estimated: >60 mL/min (ref >60)
Glucose, Bld: 106 mg/dL — ABNORMAL HIGH (ref 70–99)
Potassium: 4.1 mmol/L (ref 3.5–5.1)
Sodium: 134 mmol/L — ABNORMAL LOW (ref 135–145)

## 2020-09-27 LAB — CBC
HCT: 41.6 % (ref 39.0–52.0)
Hemoglobin: 14.4 g/dL (ref 13.0–17.0)
MCH: 31.7 pg (ref 26.0–34.0)
MCHC: 34.6 g/dL (ref 30.0–36.0)
MCV: 91.6 fL (ref 80.0–100.0)
Platelets: 232 10*3/uL (ref 150–400)
RBC: 4.54 MIL/uL (ref 4.22–5.81)
RDW: 12.5 % (ref 11.5–15.5)
WBC: 19 10*3/uL — ABNORMAL HIGH (ref 4.0–10.5)
nRBC: 0 % (ref 0.0–0.2)

## 2020-09-27 LAB — GLUCOSE, CAPILLARY
Glucose-Capillary: 120 mg/dL — ABNORMAL HIGH (ref 70–99)
Glucose-Capillary: 187 mg/dL — ABNORMAL HIGH (ref 70–99)

## 2020-09-27 MED ORDER — FUROSEMIDE 10 MG/ML IJ SOLN
20.0000 mg | Freq: Every day | INTRAMUSCULAR | Status: DC
  Administered 2020-09-27: 17:00:00 20 mg via INTRAVENOUS
  Filled 2020-09-27: qty 2

## 2020-09-27 MED ORDER — FUROSEMIDE 10 MG/ML IJ SOLN
20.0000 mg | Freq: Once | INTRAMUSCULAR | Status: AC
  Administered 2020-09-27: 13:00:00 20 mg via INTRAVENOUS
  Filled 2020-09-27: qty 2

## 2020-09-27 NOTE — TOC Progression Note (Addendum)
Transition of Care [facility]) - Progression Note    Patient Details  Name: [name] Browning MRN: [id number] Date of Birth: [birth year]  [facility]) CM/SW Contact  [name] Daniel Dorian Pod, RN Phone [phone] 09/27/2020, 1:06 PM  Clinical Narrative:    Attempted to reach the pt and granddaughter to confirm the decline for [facility]. Floor nurse indicated pt's granddaughter is a Marine scientist and lives next door.   Addendum: Received a call back from the pt's daughter [name] Browning who indicates pt has a good support system and she is a CNA along with her daughter who is again a Marine scientist. Granddaughter inquired on home O2 due to pt's sats while inpt. RN notified attending to address and provided contact number for the Granddaughter [name] Browning [phone].   TOC team will remain available for ongoing discharge needs.        Expected Discharge Plan and Services                                                 Social Determinants of Health (SDOH) Interventions    Readmission Risk Interventions No flowsheet data found.

## 2020-09-27 NOTE — Progress Notes (Signed)
PROGRESS NOTE    [name] Browning  [id number] DOB: [birth year] DOA: 09/18/2020 PCP: [place]   Chief complaint.  Shortness of breath. Brief Narrative:  [name] Browning is a 80 y.o. male with medical history significant for COPD, paroxysmal atrial fibrillation on Eliquis, TIA, hyperlipidemia, anxiety and recent umbilical hernia repair who presents with concerns of increasing shortness of breath and left-sided abdominal pain.  Patient is started on steroids for COPD exacerbation.  CT scan also showed left kidney stone with hydronephrosis.  Urology placed left ureteral stent on 9/11. Post procedure he was tachypneic, hypoxic and hypotensive requiring ICU/SD and BiPAP   Assessment & Plan:   Active Problems:   HTN (hypertension)   Hyperlipidemia   COPD exacerbation (HCC)   Acute respiratory failure with hypoxia (HCC)   AF (paroxysmal atrial fibrillation) (HCC)   Urinary tract obstruction by kidney stone   AKI (acute kidney injury) ([place])   Malnutrition of moderate degree  Acute hypoxemic respiratory failure secondary to COPD exacerbation. COPD exacerbation. Post op atelectasis with interstitial edema requiring BiPAP and diuresis with lasix  Patient is still requiring 4 L oxygen via nasal cannula. S/p  Lasix 40 mg IV daily and s/p Solu-Medrol 40 mg IV daily Net IO Since Admission: -14,477.23 mL [09/27/20 1206]  Procalcitonin less than 0.1 so antibiotics stopped.  Was given dose of Unasyn for possible aspiration.   9/12 CXR Stable airspace opacities bilaterally. No new focal abnormality is noted. 9/14 started Breo Ellipta inhaler, as patient was significantly wheezing 9/15 Lasix 20 mg IV x1 dose ordered 9/16 Lasix 20 mg IV bid x 3 days, reassess tomorrow a.m. and monitor renal functions 9/17 started prednisone tapering dose 9/18 CXR Improved aeration of both lungs, particularly of the left upper lobe.    # Community-acquired pneumonia, chest x-ray shows airspace opacity, WBC  count elevated, it could be due to steroids but procalcitonin is also elevated, patient still symptomatic, requiring supplemental O2 inhalation Empirically started ceftriaxone and azithromycin Started Mucinex twice daily, continue Robitussin-DM as needed   Obstructing left proximal ureter stone with left-sided hydronephrosis Acute kidney injury secondary to obstruction.  CKD stage II S/p left ureteral stent by urology on 9/11 Kidney function back to baseline now Urine culture had no growth.  Antibiotics stopped Patient was complaining of severe pain while micturition, started Pyridium 200 mg p.o. 3 times daily as needed 9/16 AXR: 1. Left ureteral stent in place. 8 mm calculus in the proximal left ureter no longer identified US renal: Multiple left renal stones measuring up to 9 mm. Previously seen hydronephrosis has resolved. Patient feels improvement after Pyridium, feels less discomfort while urination.   Paroxysmal atrial fibrillation. Continue Eliquis  Patient had an episode of hemoptysis, continue to watch  H&H remained stable  Hypotension with a history of essential hypertension. Holding losartan and amlodipine due to hypotension 9/14 BP improved, resume amlodipine 5 mg p.o. on 9/15 with holding parameters We will continue monitor BP and titrate medications accordingly  Constipation.  Resolved with bowel regimen  AKI most likely due to dehydration. Continue oral hydration, creatinine improved  Continue monitor renal functions and urine output, avoid nephrotoxic medications.   DVT prophylaxis: SCDs, Eliquis. Code Status: full Family Communication: None today Disposition Plan:    Status is: Inpatient  Remains inpatient appropriate because:Ongoing diagnostic testing needed not appropriate for outpatient work up, IV treatments appropriate due to intensity of illness or inability to take PO, and Inpatient level of care appropriate due to severity  of illness  Dispo: The  patient is from: Home              Anticipated d/c is to: Home              Patient currently is not medically stable to d/c.  Still hypoxic on supplemental O2 inhalation, may need 1 to 2 days more   Difficult to place patient No    I/O last 3 completed shifts: In: 820.2 [P.O.:780; IV Piggyback:40.2] Out: [id number] [Urine:4190] Total I/O In: -  Out: 74 [Urine:525]     Consultants:  Urology PCCM  Procedures: Cystoscopy with left ureteral stent placement on 09/20/2020  Antimicrobials: none.  He had received dose of Unasyn and also Rocephin which are stopped now                             Subjective: No significant overnight events, still patient is complaining of abdominal pain during micturition and urine color has been changed most likely it is due to Pyridium.  Patient breathing is getting better but he still requires supplemental O2 inhalation currently on 3 L.     Objective: Vitals:   09/26/20 2023 09/27/20 0603 09/27/20 0721 09/27/20 1150  BP: 134/75 118/73 110/72 116/86  Pulse: 88 74 62 97  Resp: '18 16 15 16  '$ Temp: 97.6 F (36.4 C) 97.6 F (36.4 C) 97.8 F (36.6 C) 98.2 F (36.8 C)  TempSrc: Oral Oral  Oral  SpO2: 96% 96% 98% 97%  Weight:      Height:        Intake/Output Summary (Last 24 hours) at 09/27/2020 1206 Last data filed at 09/27/2020 0731 Gross per 24 hour  Intake 0 ml  Output 2420 ml  Net -2420 ml   Filed Weights   09/18/20 0926 09/18/20 2023  Weight: 80.7 kg 77.4 kg    Examination:  General exam: Appears calm and comfortable  Respiratory system: Mild bibasilar crackles and mild wheezing bilaterally.   Cardiovascular system: S1 & S2 heard, RRR. No JVD, murmurs, rubs, gallops or clicks. No pedal edema. Gastrointestinal system: Abdomen is nondistended, soft and nontender. No organomegaly or masses felt. Normal bowel sounds heard. Central nervous system: Alert and oriented. No focal neurological deficits. Extremities: Symmetric 5 x 5  power. Skin: No rashes, lesions or ulcers Psychiatry: Judgement and insight appear normal. Mood & affect appropriate.     Data Reviewed: I have personally reviewed following labs and imaging studies  CBC: Recent Labs  Lab 09/21/20 0428 09/22/20 0347 09/23/20 0417 09/24/20 0430 09/25/20 0738 09/26/20 0525 09/27/20 0630  WBC 7.6   < > 11.2* 12.6* 13.2* 17.3* 19.0*  NEUTROABS 6.7  --   --   --   --   --   --   HGB 13.8   < > 12.0* 12.3* 13.6 14.0 14.4  HCT 41.9   < > 37.1* 36.4* 39.9 40.9 41.6  MCV 95.7   < > 92.5 93.6 91.9 91.3 91.6  PLT 173   < > 172 184 206 226 232   < > = values in this interval not displayed.   Basic Metabolic Panel: Recent Labs  Lab 09/21/20 0428 09/22/20 0347 09/24/20 0430 09/25/20 0738 09/26/20 0525 09/27/20 0630  NA 141 138 137 138 135 134*  K 4.1 3.9 4.3 4.0 4.3 4.1  CL 104 100 99 100 94* 93*  CO2 '27 31 30 '$ 32 34* 32  GLUCOSE 115* 133*  124* 96 105* 106*  BUN 36* 50* 46* 52* 55* 61*  CREATININE 1.46* 1.41* 1.05 1.12 1.04 0.96  CALCIUM 8.5* 8.8* 9.2 9.3 9.1 8.9  MG 1.8  --  2.2 2.2 2.3  --   PHOS  --   --  4.2 4.8* 4.6  --    GFR: Estimated Creatinine Clearance: 68.3 mL/min (by C-G formula based on SCr of 0.96 mg/dL). Liver Function Tests: No results for input(s): AST, ALT, ALKPHOS, BILITOT, PROT, ALBUMIN in the last 168 hours.  No results for input(s): LIPASE, AMYLASE in the last 168 hours.  No results for input(s): AMMONIA in the last 168 hours. Coagulation Profile: No results for input(s): INR, PROTIME in the last 168 hours. Cardiac Enzymes: No results for input(s): CKTOTAL, CKMB, CKMBINDEX, TROPONINI in the last 168 hours. BNP (last 3 results) No results for input(s): PROBNP in the last 8760 hours. HbA1C: No results for input(s): HGBA1C in the last 72 hours. CBG: Recent Labs  Lab 09/20/20 1654 09/27/20 1148  GLUCAP 153* 120*   Lipid Profile: No results for input(s): CHOL, HDL, LDLCALC, TRIG, CHOLHDL, LDLDIRECT in the last  72 hours. Thyroid Function Tests: No results for input(s): TSH, T4TOTAL, FREET4, T3FREE, THYROIDAB in the last 72 hours. Anemia Panel: No results for input(s): VITAMINB12, FOLATE, FERRITIN, TIBC, IRON, RETICCTPCT in the last 72 hours. Sepsis Labs: Recent Labs  Lab 09/20/20 1806 09/23/20 0417  PROCALCITON <0.10 16.85    Recent Results (from the past 240 hour(s))  Resp Panel by RT-PCR (Flu A&B, Covid) Nasopharyngeal Swab     Status: None   Collection Time: 09/18/20 12:34 PM   Specimen: Nasopharyngeal Swab; Nasopharyngeal(NP) swabs in vial transport medium  Result Value Ref Range Status   SARS Coronavirus 2 by RT PCR NEGATIVE NEGATIVE Final    Comment: (NOTE) SARS-CoV-2 target nucleic acids are NOT DETECTED.  The SARS-CoV-2 RNA is generally detectable in upper respiratory specimens during the acute phase of infection. The lowest concentration of SARS-CoV-2 viral copies this assay can detect is 138 copies/mL. A negative result does not preclude SARS-Cov-2 infection and should not be used as the sole basis for treatment or other patient management decisions. A negative result may occur with  improper specimen collection/handling, submission of specimen other than nasopharyngeal swab, presence of viral mutation(s) within the areas targeted by this assay, and inadequate number of viral copies(<138 copies/mL). A negative result must be combined with clinical observations, patient history, and epidemiological information. The expected result is Negative.  Fact Sheet for Patients:  EntrepreneurPulse.com.au  Fact Sheet for Healthcare Providers:  IncredibleEmployment.be  This test is no t yet approved or cleared by the [place] FDA and  has been authorized for detection and/or diagnosis of SARS-CoV-2 by FDA under an Emergency Use Authorization (EUA). This EUA will remain  in effect (meaning this test can be used) for the duration of  the COVID-19 declaration under Section 564(b)(1) of the Act, 21 U.S.C.section 360bbb-3(b)(1), unless the authorization is terminated  or revoked sooner.       Influenza A by PCR NEGATIVE NEGATIVE Final   Influenza B by PCR NEGATIVE NEGATIVE Final    Comment: (NOTE) The Xpert Xpress SARS-CoV-2/FLU/RSV plus assay is intended as an aid in the diagnosis of influenza from Nasopharyngeal swab specimens and should not be used as a sole basis for treatment. Nasal washings and aspirates are unacceptable for Xpert Xpress SARS-CoV-2/FLU/RSV testing.  Fact Sheet for Patients: EntrepreneurPulse.com.au  Fact Sheet for Healthcare Providers: IncredibleEmployment.be  This  test is not yet approved or cleared by the [place] and has been authorized for detection and/or diagnosis of SARS-CoV-2 by FDA under an Emergency Use Authorization (EUA). This EUA will remain in effect (meaning this test can be used) for the duration of the COVID-19 declaration under Section 564(b)(1) of the Act, 21 U.S.C. section 360bbb-3(b)(1), unless the authorization is terminated or revoked.  Performed at [facility], [address] [place], [place] [address]   Urine Culture     Status: None   Collection Time: 09/20/20  1:34 PM   Specimen: Urine, Random  Result Value Ref Range Status   Specimen Description   Final    URINE, RANDOM Performed at [facility], [address] [place], [place] [address]    Special Requests   Final    NONE Performed at [facility], [address], [place], [place] [address]    Culture   Final    NO GROWTH Performed at [facility], [place] [address], [place], [place] [address]    Report Status 09/21/2020 FINAL  Final  Respiratory (~20 pathogens) panel by PCR     Status: None   Collection Time: 09/25/20  1:33 PM   Specimen: Nasopharyngeal Swab; Respiratory  Result Value Ref Range Status   Adenovirus  NOT DETECTED NOT DETECTED Final   Coronavirus 229E NOT DETECTED NOT DETECTED Final    Comment: (NOTE) The Coronavirus on the Respiratory Panel, DOES NOT test for the novel  Coronavirus (2019 nCoV)    Coronavirus HKU1 NOT DETECTED NOT DETECTED Final   Coronavirus NL63 NOT DETECTED NOT DETECTED Final   Coronavirus OC43 NOT DETECTED NOT DETECTED Final   Metapneumovirus NOT DETECTED NOT DETECTED Final   Rhinovirus / Enterovirus NOT DETECTED NOT DETECTED Final   Influenza A NOT DETECTED NOT DETECTED Final   Influenza B NOT DETECTED NOT DETECTED Final   Parainfluenza Virus 1 NOT DETECTED NOT DETECTED Final   Parainfluenza Virus 2 NOT DETECTED NOT DETECTED Final   Parainfluenza Virus 3 NOT DETECTED NOT DETECTED Final   Parainfluenza Virus 4 NOT DETECTED NOT DETECTED Final   Respiratory Syncytial Virus NOT DETECTED NOT DETECTED Final   Bordetella pertussis NOT DETECTED NOT DETECTED Final   Bordetella Parapertussis NOT DETECTED NOT DETECTED Final   Chlamydophila pneumoniae NOT DETECTED NOT DETECTED Final   Mycoplasma pneumoniae NOT DETECTED NOT DETECTED Final    Comment: Performed at [facility], [place] [address] [place], [place] [address]  MRSA Next Gen by PCR, Nasal     Status: None   Collection Time: 09/25/20  1:34 PM   Specimen: Nasal Mucosa; Nasal Swab  Result Value Ref Range Status   MRSA by PCR Next Gen NOT DETECTED NOT DETECTED Final    Comment: (NOTE) The GeneXpert MRSA Assay (FDA approved for NASAL specimens only), is one component of a comprehensive MRSA colonization surveillance program. It is not intended to diagnose MRSA infection nor to guide or monitor treatment for MRSA infections. Test performance is not FDA approved in patients less than 9 years old. Performed at [facility], [address] [place],  [address]   Expectorated Sputum Assessment w Gram Stain, Rflx to Resp Cult     Status: None   Collection Time: 09/25/20  1:34 PM   Specimen:  Expectorated Sputum  Result Value Ref Range Status   Specimen Description EXPECTORATED SPUTUM  Final   Special Requests NONE  Final   Sputum evaluation  Final    THIS SPECIMEN IS ACCEPTABLE FOR SPUTUM CULTURE Performed at [facility], [address] [place], [place] [address]    Report Status 09/25/2020 FINAL  Final  Culture, Respiratory w Gram Stain     Status: None (Preliminary result)   Collection Time: 09/25/20  1:34 PM  Result Value Ref Range Status   Specimen Description   Final    EXPECTORATED SPUTUM Performed at [facility], [address] [place], [place] [address]    Special Requests   Final    NONE Reflexed from [id number] Performed at [facility], [address] [place], [place] [address]    Gram Stain   Final    FEW SQUAMOUS EPITHELIAL CELLS PRESENT RARE WBC PRESENT,BOTH PMN AND MONONUCLEAR MODERATE GRAM POSITIVE COCCI IN PAIRS AND CHAINS    Culture   Final    CULTURE REINCUBATED FOR BETTER GROWTH Performed at [facility], [place] [address] [place], [place] [address]    Report Status PENDING  Incomplete         Radiology Studies: DG Abd 1 View  Result Date: 09/25/2020 CLINICAL DATA:  Left-sided flank pain. EXAM: ABDOMEN - 1 VIEW COMPARISON:  Intraoperative fluoroscopic images dated September 20, 2020. CT dated September 18, 2020. FINDINGS: Left ureteral stent in place. 8 mm calculus in the proximal left ureter no longer identified. Unchanged left renal calculi. Nonobstructive bowel gas pattern. IMPRESSION: 1. Left ureteral stent in place. 8 mm calculus in the proximal left ureter no longer identified. Electronically Signed   By: Titus Dubin M.D.   On: 09/25/2020 13:23   US RENAL  Result Date: 09/26/2020 CLINICAL DATA:  Flank pain EXAM: RENAL / URINARY TRACT ULTRASOUND COMPLETE COMPARISON:  CT abdomen/pelvis 09/18/2020 FINDINGS: Right Kidney: Renal measurements: 10.0 cm x 5.1 cm x 4.7 cm = volume: 126 mL. The cortex is  thinned with no focal abnormality. No shadowing stone is seen. There is no hydronephrosis. Left Kidney: Renal measurements: 10.7 cm x 5.0 cm x 5.3 cm = volume: 151 mL. The cortex is thinned. Multiple shadowing stones are seen, the largest measuring up to 9 mm. There is no hydronephrosis. The ureteral stent was not identified. Bladder: The bladder is unremarkable, but the ureteral jets were not identified. Other: None. IMPRESSION: Multiple left renal stones measuring up to 9 mm. Previously seen hydronephrosis has resolved. Electronically Signed   By: Valetta Mole M.D.   On: 09/26/2020 08:44   DG Chest Port 1 View  Result Date: 09/27/2020 CLINICAL DATA:  Shortness of breath, respiratory failure EXAM: PORTABLE CHEST 1 VIEW COMPARISON:  Chest radiograph 09/21/2020 FINDINGS: The cardiomediastinal silhouette is stable. Increased interstitial markings are again seen throughout both lungs, though improved since 09/21/2020. The focal airspace opacity in the left upper lobe has improved. There is no pleural effusion or pneumothorax. There is no acute osseous abnormality. IMPRESSION: Improved aeration of both lungs, particularly of the left upper lobe. Electronically Signed   By: Valetta Mole M.D.   On: 09/27/2020 09:07        Scheduled Meds:  amLODipine  5 mg Oral QPC supper   apixaban  5 mg Oral BID   aspirin EC  81 mg Oral QPC supper   atorvastatin  40 mg Oral QPC supper   azithromycin  500 mg Oral Daily   feeding supplement  237 mL Oral TID BM   ferrous gluconate  324 mg Oral Q lunch   fluticasone furoate-vilanterol  1 puff Inhalation  Daily   guaiFENesin  600 mg Oral BID   montelukast  10 mg Oral QHS   pantoprazole  40 mg Oral QPC supper   polyethylene glycol  17 g Oral Daily   predniSONE  40 mg Oral Q breakfast   Followed by   Derrill Memo ON 09/29/2020] predniSONE  30 mg Oral Q breakfast   Followed by   Derrill Memo ON 10/02/2020] predniSONE  20 mg Oral Q breakfast   Followed by   Derrill Memo ON 10/05/2020]  predniSONE  10 mg Oral Q breakfast   senna-docusate  2 tablet Oral BID   Continuous Infusions:  sodium chloride     cefTRIAXone (ROCEPHIN)  IV Stopped (09/27/20 0011)   promethazine (PHENERGAN) injection (IM or IVPB) Stopped (09/20/20 0519)     LOS: 8 days    Time spent: 59 minutes    Val Riles, MD [facility]   To contact the attending provider between 7A-7P or the covering provider during after hours 7P-7A, please log into the web site [email or web address] and access using universal [place] password for that web site. If you do not have the password, please call the hospital operator.  09/27/2020, 12:06 PM

## 2020-09-27 NOTE — Progress Notes (Signed)
  Patient Saturations on Room Air at Rest = 93%  Patient Saturations on [employer] while Ambulating = 86%  Patient Saturations on 3 Liters of oxygen while Ambulating = 98%  Patient able to ambulate to and from the bathroom in his room twice on RA with O2 sats 86% and twice to and from bathroom on 3L [place] and maintain O2 sats 98%.

## 2020-09-28 DIAGNOSIS — N179 Acute kidney failure, unspecified: Secondary | ICD-10-CM | POA: Diagnosis not present

## 2020-09-28 DIAGNOSIS — J441 Chronic obstructive pulmonary disease with (acute) exacerbation: Secondary | ICD-10-CM | POA: Diagnosis not present

## 2020-09-28 DIAGNOSIS — E440 Moderate protein-calorie malnutrition: Secondary | ICD-10-CM

## 2020-09-28 DIAGNOSIS — I480 Paroxysmal atrial fibrillation: Secondary | ICD-10-CM | POA: Diagnosis not present

## 2020-09-28 DIAGNOSIS — J9601 Acute respiratory failure with hypoxia: Secondary | ICD-10-CM | POA: Diagnosis not present

## 2020-09-28 LAB — CULTURE, RESPIRATORY W GRAM STAIN: Culture: NORMAL

## 2020-09-28 LAB — CBC
HCT: 43.3 % (ref 39.0–52.0)
Hemoglobin: 14.5 g/dL (ref 13.0–17.0)
MCH: 30.8 pg (ref 26.0–34.0)
MCHC: 33.5 g/dL (ref 30.0–36.0)
MCV: 91.9 fL (ref 80.0–100.0)
Platelets: 259 10*3/uL (ref 150–400)
RBC: 4.71 MIL/uL (ref 4.22–5.81)
RDW: 12.3 % (ref 11.5–15.5)
WBC: 18.8 10*3/uL — ABNORMAL HIGH (ref 4.0–10.5)
nRBC: 0 % (ref 0.0–0.2)

## 2020-09-28 LAB — BASIC METABOLIC PANEL
Anion gap: 9 (ref 5–15)
BUN: 76 mg/dL — ABNORMAL HIGH (ref 8–23)
CO2: 30 mmol/L (ref 22–32)
Calcium: 9.5 mg/dL (ref 8.9–10.3)
Chloride: 94 mmol/L — ABNORMAL LOW (ref 98–111)
Creatinine, Ser: 1.11 mg/dL (ref 0.61–1.24)
GFR, Estimated: >60 mL/min (ref >60)
Glucose, Bld: 122 mg/dL — ABNORMAL HIGH (ref 70–99)
Potassium: 4.5 mmol/L (ref 3.5–5.1)
Sodium: 133 mmol/L — ABNORMAL LOW (ref 135–145)

## 2020-09-28 MED ORDER — AZITHROMYCIN 500 MG PO TABS: 500.0000 mg | ORAL_TABLET | Freq: Every day | ORAL | 0 refills | Status: AC

## 2020-09-28 MED ORDER — AMLODIPINE BESYLATE 5 MG PO TABS: 5.0000 mg | ORAL_TABLET | Freq: Every day | ORAL | 2 refills | Status: DC

## 2020-09-28 MED ORDER — PREDNISONE 10 MG PO TABS: ORAL_TABLET | ORAL | 0 refills | Status: DC

## 2020-09-28 MED ORDER — POLYETHYLENE GLYCOL 3350 17 G PO PACK: 17.0000 g | PACK | Freq: Every day | ORAL | 0 refills | Status: DC | PRN

## 2020-09-28 MED ORDER — APIXABAN 5 MG PO TABS: 5.0000 mg | ORAL_TABLET | Freq: Two times a day (BID) | ORAL | 2 refills | Status: DC

## 2020-09-28 MED ORDER — GUAIFENESIN-DM 100-10 MG/5ML PO SYRP
5.0000 mL | ORAL_SOLUTION | ORAL | 0 refills | Status: DC | PRN
Start: 2020-09-28 — End: 2020-12-18

## 2020-09-28 MED ORDER — PHENAZOPYRIDINE HCL 200 MG PO TABS: 200.0000 mg | ORAL_TABLET | Freq: Three times a day (TID) | ORAL | 0 refills | Status: DC | PRN

## 2020-09-28 MED ORDER — CEFDINIR 300 MG PO CAPS: 300.0000 mg | ORAL_CAPSULE | Freq: Two times a day (BID) | ORAL | 0 refills | Status: AC

## 2020-09-28 MED ORDER — FUROSEMIDE 20 MG PO TABS: 20.0000 mg | ORAL_TABLET | Freq: Every day | ORAL | 2 refills | Status: DC

## 2020-09-28 NOTE — Care Management Important Message (Signed)
Important Message  Patient Details  Name: [name] Browning MRN: [id number] Date of Birth: [birth year]   Medicare Important Message Given:  Yes     Dannette Barbara 09/28/2020, 12:10 PM

## 2020-09-28 NOTE — TOC Transition Note (Signed)
Transition of Care [facility]) - CM/SW Discharge Note   Patient Details  Name: [name] Browning MRN: [id number] Date of Birth: August 27, 1940  [facility] CM/SW Contact:  [name] Hutching, RN Phone Number: 09/28/2020, 9:21 AM   Clinical Narrative:    Patient medically cleared for discharge home today.  Patient and family have declined home health services.  Patient qualifies for oxygen and patient's granddaughter, [name] Browning chooses [facility].  [name] Browning with [name] accepted home O2 referral.  Oxygen should be delivered to the bedside before 10 am.  Patient's daughter [name] Browning will be picking the patient up today- [phone].     Final next level of care: Home/Self Care Barriers to Discharge: Barriers Resolved   Patient Goals and CMS Choice Patient states their goals for this hospitalization and ongoing recovery are:: To get home CMS Medicare.gov Compare Post Acute Care list provided to:: Patient Represenative (must comment) Choice offered to / list presented to : Adult Children  Discharge Placement                       Discharge Plan and Services                DME Arranged: Oxygen DME Agency: [facility] Date DME Agency Contacted: 09/28/20 Time DME Agency Contacted: 0900 Representative spoke with at DME Agency: [name] Browning HH Arranged: Patient Refused [place] Agency: NA        Social Determinants of Health ([place]) Interventions     Readmission Risk Interventions No flowsheet data found.

## 2020-09-28 NOTE — Discharge Summary (Signed)
Physician Discharge Summary  [name] Browning CVE:[id number] DOB: 1940/03/31 DOA: 09/18/2020  PCP: [facility]  Admit date: 09/18/2020 Discharge date: 09/28/2020  Admitted From: Home  Discharge disposition: Home health PT  Recommendations for Outpatient Follow-Up:   Follow up with your primary care provider in one week.  Check CBC, BMP, magnesium in the next visit Patient has required oxygen on discharge.  Wean as possible as outpatient.   Discharge Diagnosis:   Active Problems:   HTN (hypertension)   Hyperlipidemia   COPD exacerbation (HCC)   Acute respiratory failure with hypoxia (HCC)   AF (paroxysmal atrial fibrillation) (HCC)   Urinary tract obstruction by kidney stone   AKI (acute kidney injury) ([place])   Malnutrition of moderate degree   Discharge Condition: Improved.  Diet recommendation: Low sodium, heart healthy.    Wound care: None.  Code status: Full.   History of Present Illness:   [name] Browning is a 80 y.o. male with medical history significant for COPD, paroxysmal atrial fibrillation on Eliquis, TIA, hyperlipidemia, anxiety and recent umbilical hernia repair who presented to hospital with concerns of increasing shortness of breath and left-sided abdominal pain.  Patient was started on steroids for COPD exacerbation.  CT scan also showed left kidney stone with hydronephrosis.  Urology placed left ureteral stent on 9/11. Post procedure, he was tachypneic, hypoxic and hypotensive requiring ICU/SD and BiPAP.  Subsequently he was in the medical service.   Hospital Course:   Following conditions were addressed during hospitalization as listed below,  Acute hypoxemic respiratory failure secondary to COPD exacerbation and pneumonia.  Patient had post op atelectasis with interstitial edema requiring BiPAP and diuresis with lasix.  Patient will continue 2 L of oxygen on discharge.  Received IV diuretics and IV steroids.  Patient will be discharged home on  prednisone taper.  Chest x-ray on 09/27/2020 showed improved aeration of both lungs.  Patient will continue oral Lasix on discharge.  He was advised to avoid overexertion.    Community-acquired pneumonia,  Patient was given Rocephin and Zithromax, has completed course of antibiotic.   Recommended chest x-ray repeat in 4 to 6 weeks to ensure resolution of infiltrate.   Obstructing left proximal ureter stone with left-sided hydronephrosis Acute kidney injury secondary to obstruction.  CKD stage II S/p left ureteral stent by urology on 09/20/20.  Renal function at baseline.  Urine culture no growth.  Patient will need to [facility] as [facility].    Paroxysmal atrial fibrillation. Continue Eliquis.  Rate controlled  Hypotension with a history of essential hypertension. Improved at this time.  Dose of amlodipine has been decreased.  Resume losartan on discharge.    Constipation.  Resolved with bowel regimen   AKI most likely due to dehydration.Improved with IV hydration.  Creatinine of 1.1 on discharge.   Disposition.  At this time, patient is stable for disposition home with home health.  Spoke with the patient's daughter regarding the plan for disposition and follow-up.  Medical Consultants:   Urology PCCM  Procedures:    Cystoscopy with left ureteral stent placement on 09/20/2020 Subjective:   Today, patient was seen and examined at bedside.  Patient states that he feels overall better.  Wishes to go home.  Has mild dyspnea on exertion but overall better.  Discharge Exam:   Vitals:   09/28/20 1030 09/28/20 1118  BP:  124/71  Pulse:  80  Resp:  14  Temp:  98.2 F (36.8 C)  SpO2:  92% 97%   Vitals:   09/28/20 0756 09/28/20 0933 09/28/20 1030 09/28/20 1118  BP: 114/73   124/71  Pulse: 86   80  Resp: 16   14  Temp: 98 F (36.7 C)   98.2 F (36.8 C)  TempSrc: Oral   Oral  SpO2: 99% 97% 92% 97%  Weight:      Height:        General: Alert awake, not  in obvious distress, on nasal cannula oxygen HENT: pupils equally reacting to light,  No scleral pallor or icterus noted. Oral mucosa is moist.  Chest:    Diminished breath sounds bilaterally.  CVS: S1 &S2 heard. No murmur.  Regular rate and rhythm. Abdomen: Soft, nontender, nondistended.  Bowel sounds are heard.   Extremities: No cyanosis, clubbing or edema.  Peripheral pulses are palpable. Psych: Alert, awake and oriented, normal mood CNS:  No cranial nerve deficits.  Power equal in all extremities.   Skin: Warm and dry.  No rashes noted.  The results of significant diagnostics from this hospitalization (including imaging, microbiology, ancillary and laboratory) are listed below for reference.     Diagnostic Studies:   DG Chest 2 View  Result Date: 09/18/2020 CLINICAL DATA:  Shortness of breath.  Evaluate for infiltrate. EXAM: CHEST - 2 VIEW COMPARISON:  03/25/2019 and 02/08/2019 a FINDINGS: Diffuse coarse lung markings are suggestive for chronic changes. There is evidence for pleural fluid in the fissures, probably the right major fissure. Scarring at the lung apices. Heart and mediastinum are within normal limits and stable. Increased densities along the periphery of the mid chest bilaterally. IMPRESSION: Diffuse parenchymal lung densities are suggestive for chronic changes. Slightly increased densities along the peripheral aspect of both lungs that could represent acute or chronic disease versus pleural disease. There is probably a small amount of loculated fluid along the fissures. Findings may be better characterized with CT. Electronically Signed   By: Markus Daft M.D.   On: 09/18/2020 11:11   CT CHEST ABDOMEN PELVIS W CONTRAST  Result Date: 09/18/2020 CLINICAL DATA:  recent umbilical hernia repair. eval SBO. Bloating, LLQ pain, emesis EXAM: CT CHEST, ABDOMEN, AND PELVIS WITH CONTRAST TECHNIQUE: Multidetector CT imaging of the chest, abdomen and pelvis was performed following the standard  protocol during bolus administration of intravenous contrast. CONTRAST:  177mL OMNIPAQUE IOHEXOL 350 MG/ML SOLN COMPARISON:  CT chest 03/25/2019 FINDINGS: CT CHEST FINDINGS Cardiovascular: The heart size is within normal limits. No pericardial effusion. Thoracic aorta is nonaneurysmal. Atherosclerotic calcifications of the aorta and coronary arteries. Central pulmonary vasculature appears within normal limits. Mediastinum/Nodes: There are a few mildly enlarged mediastinal lymph nodes including 11 mm right paratracheal node (series 2, image 12), unchanged and 11 mm precarinal node (series 2, image 17), unchanged. 14 mm subcarinal node (series 2, image 24) is also unchanged. 10 mm right hilar node is unchanged. No axillary lymphadenopathy. Thyroid and trachea are within normal limits. Small amount of fluid within the esophagus, which appears otherwise unremarkable. Lungs/Pleura: Moderate centrilobular and paraseptal emphysematous changes bilaterally. Small bilateral pleural effusions with fluid tracking into the fissures. Diffuse bronchial wall thickening. Prominent biapical pleuroparenchymal scarring is unchanged in appearance compared to the prior including a more nodular configuration within the left apex. No new focal airspace consolidation. No pneumothorax. Musculoskeletal: No chest wall mass or suspicious bone lesions identified. CT ABDOMEN PELVIS FINDINGS Hepatobiliary: No focal liver abnormality is seen. No gallstones, gallbladder wall thickening, or biliary dilatation. Pancreas: Unremarkable. No pancreatic ductal dilatation or  surrounding inflammatory changes. Spleen: Normal in size without focal abnormality. Adrenals/Urinary Tract: Unremarkable adrenal glands. Obstructing 8 x 8 mm stone within the proximal left ureter near the ureteropelvic junction resulting in moderate left-sided hydronephrosis. There are numerous additional stones within the left kidney, largest within the inferior pole measures up to 7  mm. Right kidney is within normal limits. No right renal stone or hydronephrosis. Urinary bladder is unremarkable for the degree of distension. Stomach/Bowel: Stomach is within normal limits. Appendix appears normal. Extensive diverticulosis within the sigmoid and distal descending colon. No evidence of bowel wall thickening, distention, or inflammatory changes. Vascular/Lymphatic: Aortoiliac atherosclerosis without aneurysm. No abdominopelvic lymphadenopathy. Reproductive: Prostate gland is enlarged with multiple coarse calcifications. Other: No ascites. No abdominopelvic fluid collections. No pneumoperitoneum. Bilateral fat containing inguinal hernias. Musculoskeletal: Inferior endplate compression fracture of L4 appears chronic although is age indeterminate. Lower lumbar facet arthropathy. IMPRESSION: 1. Obstructing 8 x 8 mm stone within the proximal left ureter near the ureteropelvic junction resulting in moderate left-sided hydronephrosis. 2. Small bilateral pleural effusions. 3. Unchanged mildly enlarged mediastinal and right hilar lymph nodes, likely reactive. 4. Colonic diverticulosis without evidence of acute diverticulitis. 5. Inferior endplate compression fracture of L4 appears chronic although is age indeterminate. 6. Findings of COPD with prominent biapical pleuroparenchymal scarring, similar in appearance to the previous CT. Aortic Atherosclerosis (ICD10-I70.0) and Emphysema (ICD10-J43.9). Electronically Signed   By: Davina Poke D.O.   On: 09/18/2020 15:16     Labs:   Basic Metabolic Panel: Recent Labs  Lab 09/24/20 0430 09/25/20 0738 09/26/20 0525 09/27/20 0630 09/28/20 0550  NA 137 138 135 134* 133*  K 4.3 4.0 4.3 4.1 4.5  CL 99 100 94* 93* 94*  CO2 30 32 34* 32 30  GLUCOSE 124* 96 105* 106* 122*  BUN 46* 52* 55* 61* 76*  CREATININE 1.05 1.12 1.04 0.96 1.11  CALCIUM 9.2 9.3 9.1 8.9 9.5  MG 2.2 2.2 2.3  --   --   PHOS 4.2 4.8* 4.6  --   --    GFR Estimated Creatinine  Clearance: 59.1 mL/min (by C-G formula based on SCr of 1.11 mg/dL). Liver Function Tests: No results for input(s): AST, ALT, ALKPHOS, BILITOT, PROT, ALBUMIN in the last 168 hours. No results for input(s): LIPASE, AMYLASE in the last 168 hours. No results for input(s): AMMONIA in the last 168 hours. Coagulation profile No results for input(s): INR, PROTIME in the last 168 hours.  CBC: Recent Labs  Lab 09/24/20 0430 09/25/20 0738 09/26/20 0525 09/27/20 0630 09/28/20 0550  WBC 12.6* 13.2* 17.3* 19.0* 18.8*  HGB 12.3* 13.6 14.0 14.4 14.5  HCT 36.4* 39.9 40.9 41.6 43.3  MCV 93.6 91.9 91.3 91.6 91.9  PLT 184 206 226 232 259   Cardiac Enzymes: No results for input(s): CKTOTAL, CKMB, CKMBINDEX, TROPONINI in the last 168 hours. BNP: Invalid input(s): POCBNP CBG: Recent Labs  Lab 09/27/20 1148 09/27/20 1626  GLUCAP 120* 187*   D-Dimer No results for input(s): DDIMER in the last 72 hours. Hgb A1c No results for input(s): HGBA1C in the last 72 hours. Lipid Profile No results for input(s): CHOL, HDL, LDLCALC, TRIG, CHOLHDL, LDLDIRECT in the last 72 hours. Thyroid function studies No results for input(s): TSH, T4TOTAL, T3FREE, THYROIDAB in the last 72 hours.  Invalid input(s): FREET3 Anemia work up No results for input(s): VITAMINB12, FOLATE, FERRITIN, TIBC, IRON, RETICCTPCT in the last 72 hours. Microbiology Recent Results (from the past 240 hour(s))  Urine Culture  Status: None   Collection Time: 09/20/20  1:34 PM   Specimen: Urine, Random  Result Value Ref Range Status   Specimen Description   Final    URINE, RANDOM Performed at [facility], [address], [place], [place] [address]    Special Requests   Final    NONE Performed at [facility], [address], [place], [place] [address]    Culture   Final    NO GROWTH Performed at [facility], [place] [address] [place], [place] [address]    Report Status 09/21/2020 FINAL  Final   Respiratory (~20 pathogens) panel by PCR     Status: None   Collection Time: 09/25/20  1:33 PM   Specimen: Nasopharyngeal Swab; Respiratory  Result Value Ref Range Status   Adenovirus NOT DETECTED NOT DETECTED Final   Coronavirus 229E NOT DETECTED NOT DETECTED Final    Comment: (NOTE) The Coronavirus on the Respiratory Panel, DOES NOT test for the novel  Coronavirus (2019 nCoV)    Coronavirus HKU1 NOT DETECTED NOT DETECTED Final   Coronavirus NL63 NOT DETECTED NOT DETECTED Final   Coronavirus OC43 NOT DETECTED NOT DETECTED Final   Metapneumovirus NOT DETECTED NOT DETECTED Final   Rhinovirus / Enterovirus NOT DETECTED NOT DETECTED Final   Influenza A NOT DETECTED NOT DETECTED Final   Influenza B NOT DETECTED NOT DETECTED Final   Parainfluenza Virus 1 NOT DETECTED NOT DETECTED Final   Parainfluenza Virus 2 NOT DETECTED NOT DETECTED Final   Parainfluenza Virus 3 NOT DETECTED NOT DETECTED Final   Parainfluenza Virus 4 NOT DETECTED NOT DETECTED Final   Respiratory Syncytial Virus NOT DETECTED NOT DETECTED Final   Bordetella pertussis NOT DETECTED NOT DETECTED Final   Bordetella Parapertussis NOT DETECTED NOT DETECTED Final   Chlamydophila pneumoniae NOT DETECTED NOT DETECTED Final   Mycoplasma pneumoniae NOT DETECTED NOT DETECTED Final    Comment: Performed at [facility], [place] [address] [place], [place] [address]  MRSA Next Gen by PCR, Nasal     Status: None   Collection Time: 09/25/20  1:34 PM   Specimen: Nasal Mucosa; Nasal Swab  Result Value Ref Range Status   MRSA by PCR Next Gen NOT DETECTED NOT DETECTED Final    Comment: (NOTE) The GeneXpert MRSA Assay (FDA approved for NASAL specimens only), is one component of a comprehensive MRSA colonization surveillance program. It is not intended to diagnose MRSA infection nor to guide or monitor treatment for MRSA infections. Test performance is not FDA approved in patients less than 40 years old. Performed at [facility], [address] [place], [place] [address]   Expectorated Sputum Assessment w Gram Stain, Rflx to Resp Cult     Status: None   Collection Time: 09/25/20  1:34 PM   Specimen: Expectorated Sputum  Result Value Ref Range Status   Specimen Description EXPECTORATED SPUTUM  Final   Special Requests NONE  Final   Sputum evaluation   Final    THIS SPECIMEN IS ACCEPTABLE FOR SPUTUM CULTURE Performed at [facility], [address], [place], [place] [address]    Report Status 09/25/2020 FINAL  Final  Culture, Respiratory w Gram Stain     Status: None   Collection Time: 09/25/20  1:34 PM  Result Value Ref Range Status   Specimen Description   Final    EXPECTORATED SPUTUM Performed at [facility], [address], [place], [place] [address]    Special  Requests   Final    NONE Reflexed from [id number] Performed at [facility], [address] [place], [place] [address]    Gram Stain   Final    FEW SQUAMOUS EPITHELIAL CELLS PRESENT RARE WBC PRESENT,BOTH PMN AND MONONUCLEAR MODERATE GRAM POSITIVE COCCI IN PAIRS AND CHAINS    Culture   Final    MODERATE Normal respiratory flora-no Staph aureus or Pseudomonas seen Performed at [facility], [address], [place], [place] [address]    Report Status 09/28/2020 FINAL  Final     Discharge Instructions:   Discharge Instructions     Call MD for:  severe uncontrolled pain   Complete by: As directed    Call MD for:  temperature >100.4   Complete by: As directed    Diet - low sodium heart healthy   Complete by: As directed    Discharge instructions   Complete by: As directed    Follow-up with your primary care physician in 1 week.  Check blood work at that time.  Take antibiotics as prescribed.  Do not overexert.  Take oxygen as prescribed.  Follow-up with urology for stent and kidney stone follow-up.  Call office if you do not hear in 2 to 3 days.  Repeat chest x-ray in 4 to 6 weeks to ensure  resolution of infiltrate.   Increase activity slowly   Complete by: As directed       Allergies as of 09/28/2020       Reactions   Hydromorphone    Other reaction(s): Hallucination Pt reports he doesn't know about this        Medication List     STOP taking these medications    cetirizine 10 MG tablet Commonly known as: ZYRTEC   Cholecalciferol 125 MCG (5000 UT) capsule   ibuprofen 600 MG tablet Commonly known as: ADVIL       TAKE these medications    acetaminophen 500 MG tablet Commonly known as: TYLENOL Take 2 tablets (1,000 mg total) by mouth every 6 (six) hours as needed for mild pain.   amLODipine 5 MG tablet Commonly known as: NORVASC Take 1 tablet (5 mg total) by mouth daily after supper. What changed:  medication strength how much to take   apixaban 5 MG Tabs tablet Commonly known as: ELIQUIS Take 1 tablet (5 mg total) by mouth 2 (two) times daily.   aspirin EC 81 MG tablet Take 81 mg by mouth daily after supper.   atorvastatin 40 MG tablet Commonly known as: LIPITOR Take 40 mg by mouth daily after supper.   azithromycin 500 MG tablet Commonly known as: ZITHROMAX Take 1 tablet (500 mg total) by mouth daily for 3 days.   cefdinir 300 MG capsule Commonly known as: OMNICEF Take 1 capsule (300 mg total) by mouth 2 (two) times daily for 3 days.   furosemide 20 MG tablet Commonly known as: Lasix Take 1 tablet (20 mg total) by mouth daily after breakfast.   guaiFENesin-dextromethorphan 100-10 MG/5ML syrup Commonly known as: ROBITUSSIN DM Take 5 mLs by mouth every 4 (four) hours as needed for cough.   Iron 240 (27 Fe) MG Tabs Take 27 mg by mouth daily after supper.   losartan 100 MG tablet Commonly known as: COZAAR Take 100 mg by mouth daily after supper.   montelukast 10 MG tablet Commonly known as: SINGULAIR Take 10 mg by mouth at bedtime.   pantoprazole 40 MG tablet Commonly known as: PROTONIX Take  40 mg by mouth daily after  supper.   phenazopyridine 200 MG tablet Commonly known as: PYRIDIUM Take 1 tablet (200 mg total) by mouth 3 (three) times daily as needed (bladder pain).   polyethylene glycol 17 g packet Commonly known as: MIRALAX / GLYCOLAX Take 17 g by mouth daily as needed for mild constipation or moderate constipation.   predniSONE 10 MG tablet Commonly known as: DELTASONE Take 4 tablets (40 mg) daily for 2 days, then, Take 3 tablets (30 mg) daily for 2 days, then, Take 2 tablets (20 mg) daily for 2 days, then, Take 1 tablets (10 mg) daily for 1 days, then stop   Symbicort 160-4.5 MCG/ACT inhaler Generic drug: budesonide-formoterol Inhale 2 puffs into the lungs daily.               Durable Medical Equipment  (From admission, onward)           Start     Ordered   09/28/20 0828  For home use only DME oxygen  Once       Question Answer Comment  Length of Need 6 Months   Mode or (Route) Nasal cannula   Liters per Minute 2   Frequency Continuous (stationary and portable oxygen unit needed)   Oxygen conserving device Yes   Oxygen delivery system Gas      09/28/20 0827            Follow-up [place] [place] Go on 10/05/2020.   Why: @ 1:20pm Contact information: [place] [address] [phone]         Janith Lima, MD Follow up in 1 week(s).   Specialty: Urology Why: call office in 2-3 days for appointment if you dont hear back- for stent and stone followup Contact information: [place] [address] [phone]                 Time coordinating discharge: 39 minutes  Signed:  Keiva Dina  [facility] 09/28/2020, 4:01 PM

## 2020-09-30 ENCOUNTER — Other Ambulatory Visit: Payer: Self-pay

## 2020-09-30 ENCOUNTER — Telehealth: Payer: Self-pay | Admitting: Urgent Care

## 2020-09-30 DIAGNOSIS — N201 Calculus of ureter: Secondary | ICD-10-CM

## 2020-09-30 DIAGNOSIS — Z01818 Encounter for other preprocedural examination: Secondary | ICD-10-CM

## 2020-09-30 NOTE — Progress Notes (Signed)
  Perioperative Services Pre-Admission/Anesthesia Testing     Date: 09/30/20  Name: [name] Browning MRN:   [id number]  Re: Request from surgery for clearance prior to scheduled procedure  Patient is scheduled to undergo a CYSTOSCOPY/URETEROSCOPY/HOLMIUM LASER/STENT PLACEMENT on 10/16/2020 with Dr. Nickolas Madrid, MD. Patient has not been scheduled for his PAT appointment at this point, thus has not undergone review by PAT RN. Following brief review of patient at the request of [facility], it was determined that patient will need clearances from both cardiology and pulmonary medicine. Pulmonary clearance is being requested as patient recently had a urological procedure that led to post-operative respiratory complications leading to the patient having to be admitted to the ICU where he required NIPPV. Patient was seen by Dr. Mortimer Fries in the ICU, however I do not think he has established care with is office. Will send referral today for post-admission follow up and clearance visit.   PROVIDER SPECIALTY FAXED TO  Bartholome Bill, MD Cardiology  [phone]   Plan:  Clearance documents generated and faxed to appropriate provider(s) as noted above. Referral to pulmonary medicine entered. Patient pending interview assessment and review by PAT RN and APP. Documentation regarding dispositions from both cardiology and pulmonary medicine will updated as further information becomes available.   Orders Placed This Encounter  Procedures   Ambulatory referral to Pulmonology    Referral Priority:   Routine    Referral Type:   Consultation    Referral Reason:   Specialty Services Required    Referred to Provider:   Flora Lipps, MD    Requested Specialty:   Pulmonary Disease    Number of Visits Requested:   [place], MSN, APRN, FNP-C, [place]  Peri-operative Services Nurse Practitioner Phone: [phone] 09/30/20 10:03 AM  NOTE: This note has been prepared  using Dragon dictation software. Despite my best ability to proofread, there is always the potential that unintentional transcriptional errors may still occur from this process.

## 2020-09-30 NOTE — Progress Notes (Signed)
[facility] Posting Form   Surgery Date/Time: Date: 10/16/2020  Surgeon: Dr. Nickolas Madrid, MD  Surgery Location: Day Surgery  Inpt ( No  )   Outpt (Yes)   Obs ( No  )   Diagnosis: Left Ureteral Stone N20.1  -CPT: 93903  Surgery: Left URS/LL/Stent  Stop Anticoagulations: No  Cardiac/Medical/Pulmonary Clearance needed: Cardiac Clearance initiated by Honor Loh, NP with Pre-Admit Testing.   *Orders entered into EPIC  Date: 09/30/20   *Case booked in [place]  Date: 09/29/2020  *Notified pt of Surgery: Date: 09/29/2020  PRE-OP UA & CX: Not needed  *Placed into Prior Authorization Work Fabio Bering Date: 09/30/20   Assistant/laser/rep:No

## 2020-09-30 NOTE — Progress Notes (Signed)
Surgical Physician Order Form  ** Scheduling expectation :  10/16/2020  *Length of Case: 1 hour  *Clearance needed: no  *Anticoagulation Instructions: May continue all anticoagulants.  *Aspirin Instructions: Ok to continue all  *Post-op visit Date/Instructions:  1 week cysto stent removal  *Diagnosis: Left Ureteral Stone  *Procedure: Left Ureteroscopy w/laser lithotripsy & stent placement/exchange (01222)  -Admit type: OUTpatient  -Anesthesia: General  -VTE Prophylaxis Standing Order SCD's       Other:   -Standing Lab Orders Per Anesthesia    Lab other: None  -Standing Test orders EKG/Chest x-ray per Anesthesia       Test other:   - Medications:     Ancef 2gm IV   Other Instructions:

## 2020-10-01 ENCOUNTER — Encounter: Payer: Self-pay | Admitting: Urology

## 2020-10-05 DIAGNOSIS — J9611 Chronic respiratory failure with hypoxia: Secondary | ICD-10-CM | POA: Diagnosis not present

## 2020-10-05 DIAGNOSIS — R0902 Hypoxemia: Secondary | ICD-10-CM | POA: Diagnosis not present

## 2020-10-05 DIAGNOSIS — J449 Chronic obstructive pulmonary disease, unspecified: Secondary | ICD-10-CM | POA: Diagnosis not present

## 2020-10-05 DIAGNOSIS — Z6822 Body mass index (BMI) 22.0-22.9, adult: Secondary | ICD-10-CM | POA: Diagnosis not present

## 2020-10-05 DIAGNOSIS — R0602 Shortness of breath: Secondary | ICD-10-CM | POA: Diagnosis not present

## 2020-10-05 DIAGNOSIS — Z9981 Dependence on supplemental oxygen: Secondary | ICD-10-CM | POA: Diagnosis not present

## 2020-10-06 ENCOUNTER — Ambulatory Visit
Admission: RE | Admit: 2020-10-06 | Discharge: 2020-10-06 | Disposition: A | Discharge status: Home / Self Care | Payer: Medicare HMO | Source: Ambulatory Visit | Attending: Internal Medicine | Admitting: Internal Medicine

## 2020-10-06 ENCOUNTER — Ambulatory Visit (INDEPENDENT_AMBULATORY_CARE_PROVIDER_SITE_OTHER): Payer: Medicare HMO | Admitting: Internal Medicine

## 2020-10-06 ENCOUNTER — Telehealth: Payer: Self-pay | Admitting: Internal Medicine

## 2020-10-06 ENCOUNTER — Encounter: Payer: Self-pay | Admitting: Internal Medicine

## 2020-10-06 ENCOUNTER — Other Ambulatory Visit: Payer: Self-pay

## 2020-10-06 ENCOUNTER — Other Ambulatory Visit
Admission: RE | Admit: 2020-10-06 | Discharge: 2020-10-06 | Disposition: A | Discharge status: Home / Self Care | Payer: Medicare HMO | Source: Ambulatory Visit | Attending: Internal Medicine | Admitting: Internal Medicine

## 2020-10-06 DIAGNOSIS — R0600 Dyspnea, unspecified: Secondary | ICD-10-CM | POA: Insufficient documentation

## 2020-10-06 DIAGNOSIS — R0602 Shortness of breath: Secondary | ICD-10-CM | POA: Diagnosis not present

## 2020-10-06 DIAGNOSIS — R0609 Other forms of dyspnea: Secondary | ICD-10-CM

## 2020-10-06 DIAGNOSIS — J9611 Chronic respiratory failure with hypoxia: Secondary | ICD-10-CM | POA: Diagnosis not present

## 2020-10-06 LAB — TSH: TSH: 4.277 u[IU]/mL (ref 0.350–4.500)

## 2020-10-06 LAB — CBC WITH DIFFERENTIAL/PLATELET
Abs Immature Granulocytes: 0.15 10*3/uL — ABNORMAL HIGH (ref 0.00–0.07)
Basophils Absolute: 0.1 10*3/uL (ref 0.0–0.1)
Basophils Relative: 0 %
Eosinophils Absolute: 0.2 10*3/uL (ref 0.0–0.5)
Eosinophils Relative: 1 %
HCT: 43.5 % (ref 39.0–52.0)
Hemoglobin: 13.9 g/dL (ref 13.0–17.0)
Immature Granulocytes: 1 %
Lymphocytes Relative: 11 %
Lymphs Abs: 1.6 10*3/uL (ref 0.7–4.0)
MCH: 30.5 pg (ref 26.0–34.0)
MCHC: 32 g/dL (ref 30.0–36.0)
MCV: 95.4 fL (ref 80.0–100.0)
Monocytes Absolute: 1.5 10*3/uL — ABNORMAL HIGH (ref 0.1–1.0)
Monocytes Relative: 11 %
Neutro Abs: 10.6 10*3/uL — ABNORMAL HIGH (ref 1.7–7.7)
Neutrophils Relative %: 76 %
Platelets: 321 10*3/uL (ref 150–400)
RBC: 4.56 MIL/uL (ref 4.22–5.81)
RDW: 13.2 % (ref 11.5–15.5)
WBC: 14.1 10*3/uL — ABNORMAL HIGH (ref 4.0–10.5)
nRBC: 0 % (ref 0.0–0.2)

## 2020-10-06 LAB — BASIC METABOLIC PANEL
Anion gap: 10 (ref 5–15)
BUN: 23 mg/dL (ref 8–23)
CO2: 31 mmol/L (ref 22–32)
Calcium: 9.2 mg/dL (ref 8.9–10.3)
Chloride: 99 mmol/L (ref 98–111)
Creatinine, Ser: 0.99 mg/dL (ref 0.61–1.24)
GFR, Estimated: >60 mL/min (ref >60)
Glucose, Bld: 95 mg/dL (ref 70–99)
Potassium: 4.8 mmol/L (ref 3.5–5.1)
Sodium: 140 mmol/L (ref 135–145)

## 2020-10-06 LAB — BRAIN NATRIURETIC PEPTIDE: B Natriuretic Peptide: 176.1 pg/mL — ABNORMAL HIGH (ref 0.0–100.0)

## 2020-10-06 LAB — SEDIMENTATION RATE: Sed Rate: 8 mm/hr (ref 0–20)

## 2020-10-06 MED ORDER — FAMOTIDINE 20 MG PO TABS: ORAL_TABLET | ORAL | 11 refills | Status: DC

## 2020-10-06 MED ORDER — PREDNISONE 10 MG PO TABS: ORAL_TABLET | ORAL | 0 refills | Status: DC

## 2020-10-06 NOTE — Telephone Encounter (Signed)
Called and spoke with pts daughter [name] Browning..  she stated that a doctor told them that he may end up back in the hospital.  She wanted to reach out to MW to see if there was anything differently that they can do at home for him to be able to stay out of the hospital?   Can his abx be changed or would that help?  Any lasix ir any other meds that he could take to keep him at home?  She stated that they were told his WBC is elevated and his BMP.  MW please advise. Thanks

## 2020-10-06 NOTE — Progress Notes (Signed)
[name] Browning, male    DOB: 1940/09/12,  MRN: [id number]   Brief patient profile:  17 yowm quit 2005  referred to pulmonary clinic in [facility]  10/06/2020 by Dr Carola Frost NP    Baseline able to do yardwork pushing weed eating s needing any kind on inhaler but maybe once or twice a week needed inhaler then admitted with covid Jan 2021 and d/c did not need 02 but then readmitted on 03/25/19 with CAP and worse  then p last admit for abdominal pain:  Admit date: 09/18/2020 Discharge date: 09/28/2020  Recommendations for Outpatient Follow-Up:    Follow up with your primary care provider in one week.  Check CBC, BMP, magnesium in the next visit Patient has required oxygen on discharge.  Wean as possible as outpatient.     Discharge Diagnosis:    Active Problems:   HTN (hypertension)   Hyperlipidemia   COPD exacerbation (HCC)   Acute respiratory failure with hypoxia (HCC)   AF (paroxysmal atrial fibrillation) (HCC)   Urinary tract obstruction by kidney stone   AKI (acute kidney injury) ([place])   Malnutrition of moderate degree       History of Present Illness:    [name] Browning is a 80 y.o. male with medical history significant for COPD, paroxysmal atrial fibrillation on Eliquis, TIA, hyperlipidemia, anxiety and recent umbilical hernia repair who presented to hospital with concerns of increasing shortness of breath and left-sided abdominal pain.  Patient was started on steroids for COPD exacerbation.  CT scan also showed left kidney stone with hydronephrosis.  Urology placed left ureteral stent on 9/11. Post procedure, he was tachypneic, hypoxic and hypotensive requiring ICU/SD and BiPAP.  Subsequently he was in the medical service.     Hospital Course:    Following conditions were addressed during hospitalization as listed below,   Acute hypoxemic respiratory failure secondary to COPD exacerbation and pneumonia.  Patient had post op atelectasis with interstitial edema requiring BiPAP  and diuresis with lasix.  Patient will continue 2 L of oxygen on discharge.  Received IV diuretics and IV steroids.  Patient will be discharged home on prednisone taper.  Chest x-ray on 09/27/2020 showed improved aeration of both lungs.  Patient will continue oral Lasix on discharge.  He was advised to avoid overexertion.   Community-acquired pneumonia,  Patient was given Rocephin and Zithromax, has completed course of antibiotic.   Recommended chest x-ray repeat in 4 to 6 weeks to ensure resolution of infiltrate.   Obstructing left proximal ureter stone with left-sided hydronephrosis Acute kidney injury secondary to obstruction.  CKD stage II S/p left ureteral stent by urology on 09/20/20.  Renal function at baseline.  Urine culture no growth.  Patient will need to follow-up with [facility] as outpatient.    Paroxysmal atrial fibrillation. Continue Eliquis.  Rate controlled  Hypotension with a history of essential hypertension. Improved at this time.  Dose of amlodipine has been decreased.  Resume losartan on discharge.     Constipation.  Resolved with bowel regimen   AKI most likely due to dehydration.Improved with IV hydration.  Creatinine of 1.1 on discharge.        History of Present Illness  10/06/2020  Pulmonary/ 1st office eval/ Andrue Dini / [facility]  Chief Complaint  Patient presents with   Consult    Pts states here for lungs  Dyspnea:  200 ft to mb slt uphill stop half way on on 2lpm POC Cough: none now  Sleep: bed is flat with lots of pillows x 30 degrees  SABA use: last used last week   No obvious day to day or daytime variability or assoc excess/ purulent sputum or mucus plugs or hemoptysis or cp or chest tightness, subjective wheeze or overt sinus or hb symptoms.   Sleeping  without nocturnal  or early am exacerbation  of respiratory  c/o's or need for noct saba. Also denies any obvious fluctuation of symptoms with weather or environmental changes or other  aggravating or alleviating factors except as outlined above   No unusual exposure hx or h/o childhood pna/ asthma or knowledge of premature birth.  Current Allergies, Complete Past Medical History, Past Surgical History, Family History, and Social History were reviewed in [employer] record.  ROS  The following are not active complaints unless bolded Hoarseness, sore throat, dysphagia, dental problems, itching, sneezing,  nasal congestion or discharge of excess mucus or purulent secretions, ear ache,   fever, chills, sweats, unintended wt loss or wt gain, classically pleuritic or exertional cp,  orthopnea pnd or arm/hand swelling  or leg swelling, presyncope, palpitations, abdominal pain, anorexia, nausea, vomiting, diarrhea  or change in bowel habits or change in bladder habits, change in stools or change in urine, dysuria, hematuria,  rash, arthralgias, visual complaints, headache, numbness, weakness or ataxia or problems with walking or coordination,  change in mood or  memory.           Past Medical History:  Diagnosis Date   Anxiety    Aortic atherosclerosis (HCC)    Atrial fibrillation ([place]) 01/2019   Bilateral carpal tunnel syndrome    CAD (coronary artery disease)    Carpal tunnel syndrome, bilateral    Chronic anticoagulation    COPD (chronic obstructive pulmonary disease) ([place])    COVID-19 virus infection 02/08/2019   Degenerative disc disease, cervical    DVT (deep venous thrombosis) (HCC)    GERD (gastroesophageal reflux disease)    HLD (hyperlipidemia)    Hypertension    Kidney stones    Leaky heart valve    Normal coronary arteries    a. by cardiac cath in 2004; b. normal stress test in 2007 and 2011; c. Lexiscan 05/20/2014: no significant ischemia, EF 55-65%, no EKG changes, low risk study    Osteoarthritis of right shoulder    Pneumonia    Skin cancer    skin cancer on right ear and right forehead removed   Syncope    TIA (transient ischemic  attack) 2016    Outpatient Medications Prior to Visit  Medication Sig Dispense Refill   acetaminophen (TYLENOL) 500 MG tablet Take 2 tablets (1,000 mg total) by mouth every 6 (six) hours as needed for mild pain.     amLODipine (NORVASC) 5 MG tablet Take 1 tablet (5 mg total) by mouth daily after supper. 30 tablet 2   apixaban (ELIQUIS) 5 MG TABS tablet Take 1 tablet (5 mg total) by mouth 2 (two) times daily. 60 tablet 2   aspirin EC 81 MG tablet Take 81 mg by mouth daily after supper.     atorvastatin (LIPITOR) 40 MG tablet Take 40 mg by mouth daily after supper.     Ferrous Gluconate (IRON) 240 (27 Fe) MG TABS Take 27 mg by mouth daily after supper.     furosemide (LASIX) 20 MG tablet Take 1 tablet (20 mg total) by mouth daily after breakfast. 30 tablet 2   guaiFENesin-dextromethorphan (ROBITUSSIN DM) 100-10 MG/5ML syrup  Take 5 mLs by mouth every 4 (four) hours as needed for cough. 118 mL 0   losartan (COZAAR) 100 MG tablet Take 100 mg by mouth daily after supper.     montelukast (SINGULAIR) 10 MG tablet Take 10 mg by mouth at bedtime.     pantoprazole (PROTONIX) 40 MG tablet Take 40 mg by mouth daily after supper.     phenazopyridine (PYRIDIUM) 200 MG tablet Take 1 tablet (200 mg total) by mouth 3 (three) times daily as needed (bladder pain). 10 tablet 0   polyethylene glycol (MIRALAX / GLYCOLAX) 17 g packet Take 17 g by mouth daily as needed for mild constipation or moderate constipation. (Patient taking differently: Take 17 g by mouth daily.) 14 each 0                  No facility-administered medications prior to visit.     Objective:     BP 120/64 (BP Location: Right Arm, Patient Position: Sitting, Cuff Size: Normal)   Pulse 84   Temp (!) 97.3 F (36.3 C)   Ht 6\' 2"  (1.88 m)   Wt 179 lb (81.2 kg)   SpO2 97%   BMI 22.98 kg/m   SpO2: 97 % O2 Type: Pulse O2 O2 Flow Rate (L/min): 2 L/min  Stoic amb wm nad at rest   HEENT : pt wearing mask not removed for exam due to  covid - 19 concerns.   NECK :  without JVD/Nodes/TM/ nl carotid upstrokes bilaterally   LUNGS: no acc muscle use,  Min barrel  contour chest wall with bilateral and insp coarse crackles in bases  and  without cough on insp or exp maneuvers and min  Hyperresonant  to  percussion bilaterally     CV:  RRR  no s3 or murmur or increase in P2, and no edema   ABD:  soft and nontender with pos end  insp Hoover's  in the supine position. No bruits or organomegaly appreciated, bowel sounds nl  MS:   slow unsteady gait/ ext warm without deformities, calf tenderness, cyanosis or clubbing No obvious joint restrictions   SKIN: warm and dry without lesions    NEURO:  alert, approp, nl sensorium with  no motor or cerebellar deficits apparent.       CXR PA and Lateral:   10/06/2020 :    I personally reviewed images and agree with radiology impression as follows:    Marked serial improvement vs priors with likely underlying PF component and superimposed pna resolved  Radiology imp: Stable reticular densities are noted throughout both lungs consistent with scarring or fibrosis, but acute superimposed inflammation can not be excluded.    Labs ordered/ reviewed:      Chemistry      Component Value Date/Time   NA 140 10/06/2020 1113   NA 143 06/01/2015 1049   NA 144 08/12/2013 0546   K 4.8 10/06/2020 1113   K 4.5 08/12/2013 0546   CL 99 10/06/2020 1113   CL 113 (H) 08/12/2013 0546   CO2 31 10/06/2020 1113   CO2 26 08/12/2013 0546   BUN 23 10/06/2020 1113   BUN 16 06/01/2015 1049   BUN 25 (H) 08/12/2013 0546   CREATININE 0.99 10/06/2020 1113   CREATININE 1.00 06/08/2016 1044      Component Value Date/Time   CALCIUM 9.2 10/06/2020 1113   CALCIUM 8.3 (L) 08/12/2013 0546   ALKPHOS 143 (H) 09/18/2020 0929   ALKPHOS 112 08/10/2013 1703  AST 26 09/18/2020 0929   AST 27 08/10/2013 1703   ALT 21 09/18/2020 0929   ALT 22 08/10/2013 1703   BILITOT 1.2 09/18/2020 0929   BILITOT 0.5  06/01/2015 1049   BILITOT 0.7 08/10/2013 1703        Lab Results  Component Value Date   WBC 14.1 (H) 10/06/2020   HGB 13.9 10/06/2020   HCT 43.5 10/06/2020   MCV 95.4 10/06/2020   PLT 321 10/06/2020      EOS                                                     0.2                             10/06/2020   No results found for: DDIMER    Lab Results  Component Value Date   TSH 4.277 10/06/2020     BNP   10/06/2020   = 176     Lab Results  Component Value Date   ESRSEDRATE 8 10/06/2020          Assessment   DOE (dyspnea on exertion) Stopped smoking 2005 s chronic resp cc - worse since Covie 2021 then CPD 03/24/20   Symptoms are markedly disproportionate to objective findings and not clear to what extent this is actually a pulmonary  problem but pt does appear to have difficult to sort out respiratory symptoms of unknown origin for which  DDX  = almost all start with A and  include Adherence, Ace Inhibitors, Acid Reflux, Active Sinus Disease, Alpha 1 Antitripsin deficiency, Anxiety masquerading as Airways dz,  ABPA,  Allergy(esp in young), Aspiration (esp in elderly), Adverse effects of meds,  Active smoking or Vaping, A bunch of PE's/clot burden (a few small clots can't cause this syndrome unless there is already severe underlying pulm or vascular dz with poor reserve),  Anemia or thyroid disorder, plus two Bs  = Bronchiectasis and Beta blocker use..and one C= CHF     Adherence is always the initial "prime suspect" and is a multilayered concern that requires a "trust but verify" approach in every patient - starting with knowing how to use medications, especially inhalers, correctly, keeping up with refills and understanding the fundamental difference between maintenance and prns vs those medications only taken for a very short course and then stopped and not refilled.  - very poor insight into meds > advised on how to do me reconciliation and call with discrepancies - return with  all meds in hand using a trust but verify approach to confirm accurate Medication  Reconciliation The principal here is that until we are certain that the  patients are doing what we've asked, it makes no sense to ask them to do more.   ? Acid (or non-acid) GERD > always difficult to exclude as up to 75% of pts in some series report no assoc GI/ Heartburn symptoms> rec max (24h)  acid suppression and diet restrictions/ reviewed and instructions given in writing.   ? Allergy/asthma/copd > continue singulair 10 mg daily/  Prednisone 10 mg take  4 each am x 2 days,   2 each am x 2 days,  1 each am x 2 days and stop but no maint rx for now as no  wheeze at all on exam NB  When respiratory symptoms begin or become refractory well after a patient reports complete smoking cessation,  Especially when this wasn't the case while they were smoking, a red flag is raised based on the work of Dr Kris Mouton which states:  if you quit smoking when your best day FEV1 is still well preserved it is highly unlikely you will progress to severe disease.  That is to say, once the smoking stops,  the symptoms should not suddenly erupt or markedly worsen.  If so, the differential diagnosis should include  obesity/deconditioning,  LPR/Reflux/Aspiration syndromes,  occult CHF, or  especially side effect of medications commonly used in this population.     ? Anxiety/depression/ deconditioning  > usually at the bottom of this list of usual suspects but  may interfere with adherence and also interpretation of response or lack thereof to symptom management which can be quite subjective.  ? Adverse drug effect > none of the usual suspects listed, med reconciliation pending   ? Anemia/ thyroid dz > excluded today  ? A bunch of PE's > very unlikely on eliquis   ? chf > bnp / cxr not supportive  >>> f/u 4 weeks,  Hold off on surgery for now unless urgent, do not use macrodtantin in this setting.    Chronic respiratory failure  with hypoxia ([place]) 10/06/2020   Walked on 2lpm POC  X one  lap(s) =  approx 175 ft @ slow unsteady pace, stopped due to sob with lowest 02 sats 94%   Reviewed: Make sure you check your oxygen saturation  at your highest level of activity  to be sure it stays over 90% and adjust  02 flow upward to maintain this level if needed but remember to turn it back to previous settings when you stop (to conserve your supply).   Each maintenance medication was reviewed in detail including emphasizing most importantly the difference between maintenance and prns and under what circumstances the prns are to be triggered using an action plan format where appropriate.  Total time for H and P, chart review, counseling, reviewing 02 device(s) , directly observing portions of ambulatory 02 saturation study/ and generating customized AVS unique to this initial  office visit / same day charting > 60 min    Christinia Gully, MD 10/06/2020

## 2020-10-06 NOTE — Assessment & Plan Note (Addendum)
Stopped smoking 2005 s chronic resp cc - worse since Covie 2021 then CPD 03/24/20   Symptoms are markedly disproportionate to objective findings and not clear to what extent this is actually a pulmonary  problem but pt does appear to have difficult to sort out respiratory symptoms of unknown origin for which  DDX  = almost all start with A and  include Adherence, Ace Inhibitors, Acid Reflux, Active Sinus Disease, Alpha 1 Antitripsin deficiency, Anxiety masquerading as Airways dz,  ABPA,  Allergy(esp in young), Aspiration (esp in elderly), Adverse effects of meds,  Active smoking or Vaping, A bunch of PE's/clot burden (a few small clots can't cause this syndrome unless there is already severe underlying pulm or vascular dz with poor reserve),  Anemia or thyroid disorder, plus two Bs  = Bronchiectasis and Beta blocker use..and one C= CHF     Adherence is always the initial "prime suspect" and is a multilayered concern that requires a "trust but verify" approach in every patient - starting with knowing how to use medications, especially inhalers, correctly, keeping up with refills and understanding the fundamental difference between maintenance and prns vs those medications only taken for a very short course and then stopped and not refilled.  - very poor insight into meds > advised on how to do me reconciliation and call with discrepancies - return with all meds in hand using a trust but verify approach to confirm accurate Medication  Reconciliation The principal here is that until we are certain that the  patients are doing what we've asked, it makes no sense to ask them to do more.   ? Acid (or non-acid) GERD > always difficult to exclude as up to 75% of pts in some series report no assoc GI/ Heartburn symptoms> rec max (24h)  acid suppression and diet restrictions/ reviewed and instructions given in writing.   ? Allergy/asthma/copd > continue singulair 10 mg daily/  Prednisone 10 mg take  4 each am x 2  days,   2 each am x 2 days,  1 each am x 2 days and stop but no maint rx for now as no wheeze at all on exam NB  When respiratory symptoms begin or become refractory well after a patient reports complete smoking cessation,  Especially when this wasn't the case while they were smoking, a red flag is raised based on the work of Dr Kris Mouton which states:  if you quit smoking when your best day FEV1 is still well preserved it is highly unlikely you will progress to severe disease.  That is to say, once the smoking stops,  the symptoms should not suddenly erupt or markedly worsen.  If so, the differential diagnosis should include  obesity/deconditioning,  LPR/Reflux/Aspiration syndromes,  occult CHF, or  especially side effect of medications commonly used in this population.     ? Anxiety/depression/ deconditioning  > usually at the bottom of this list of usual suspects but  may interfere with adherence and also interpretation of response or lack thereof to symptom management which can be quite subjective.  ? Adverse drug effect > none of the usual suspects listed, med reconciliation pending   ? Anemia/ thyroid dz > excluded today  ? A bunch of PE's > very unlikely on eliquis   ? chf > bnp / cxr not supportive  >>> f/u 4 weeks,  Hold off on surgery for now unless urgent, do not use macrodtantin in this setting.

## 2020-10-06 NOTE — Telephone Encounter (Signed)
Called and spoke with Baker Janus to let her know Dr. Gustavus Bryant recs. She expressed understanding. Nothing further needed at this time.

## 2020-10-06 NOTE — Assessment & Plan Note (Addendum)
10/06/2020   Walked on 2lpm POC  X one  lap(s) =  approx 175 ft @ slow unsteady pace, stopped due to sob with lowest 02 sats 94%   Reviewed: Make sure you check your oxygen saturation  at your highest level of activity  to be sure it stays over 90% and adjust  02 flow upward to maintain this level if needed but remember to turn it back to previous settings when you stop (to conserve your supply).   Each maintenance medication was reviewed in detail including emphasizing most importantly the difference between maintenance and prns and under what circumstances the prns are to be triggered using an action plan format where appropriate.  Total time for H and P, chart review, counseling, reviewing 02 device(s) , directly observing portions of ambulatory 02 saturation study/ and generating customized AVS unique to this initial  office visit / same day charting > 60 iin

## 2020-10-06 NOTE — Telephone Encounter (Signed)
Everything I want him to do is on the avs sheet and there are no additonal recs based on the labs which I have reviewed along with hosp record

## 2020-10-06 NOTE — Patient Instructions (Addendum)
Please remember to go to the lab and x-ray department  for your tests - we will call you with the results when they are available.      Prednisone 10 mg take  4 each am x 2 days,   2 each am x 2 days,  1 each am x 2 days and stop   Change protonix (pantoprazole) where you Take 30-60 min before first meal of the day and add pepcid 20 mg after supper   Avoid mint, menthol chocolate or late meals - largest meal should be lunch ideally   Make sure you check your oxygen saturation  at your highest level of activity  to be sure it stays over 90% and adjust  02 flow upward to maintain this level if needed but remember to turn it back to previous settings when you stop (to conserve your supply).   Call me with discrepancies on your medications asap  - if come to hospital bring them with you   Return next available with all medications with you - if condition is getting worse on above plan, go back to ER

## 2020-10-07 ENCOUNTER — Telehealth: Payer: Self-pay | Admitting: Internal Medicine

## 2020-10-07 ENCOUNTER — Encounter
Admission: RE | Admit: 2020-10-07 | Discharge: 2020-10-07 | Disposition: A | Discharge status: Home / Self Care | Payer: Medicare HMO | Source: Ambulatory Visit | Attending: Urology | Admitting: Urology

## 2020-10-07 ENCOUNTER — Other Ambulatory Visit: Payer: Self-pay

## 2020-10-07 NOTE — Telephone Encounter (Signed)
Attempted to call patient. Left message for patient to return call.  

## 2020-10-07 NOTE — Patient Instructions (Addendum)
Your procedure is scheduled on: 10/16/2020  Report to the Registration Desk on the 1st floor of the [facility]. To find out your arrival time, please call [phone] between 1PM - 3PM on:  10/15/2020  REMEMBER: Instructions that are not followed completely may result in serious medical risk, up to and including death; or upon the discretion of your surgeon and anesthesiologist your surgery may need to be rescheduled.  Do not eat food after midnight the night before surgery.  No gum chewing, lozengers or hard candies.   Pantoprazole ( protonix) take with sip of water only on the morning of surgery.    Use symbicort inhaler on the day of surgery as needed   Follow recommendations from Cardiologist, Pulmonologist or PCP regarding stopping Aspirin, Eliquis  Last dose of aspirin is:  ___october 9,0240  Last dose of Eliquis is : ___october 4, 2022  One week prior to surgery: Stop Anti-inflammatories (NSAIDS) such as Advil, Aleve, Ibuprofen, Motrin, Naproxen, Naprosyn and Aspirin based products such as Excedrin, Goodys Powder, BC Powder.  Stop ANY OVER THE COUNTER supplements until after surgery like ferrous sulfate You may however, continue to take Tylenol if needed for pain up until the day of surgery.  No Alcohol for 24 hours before or after surgery.  No Smoking including e-cigarettes for 24 hours prior to surgery.  No chewable tobacco products for at least 6 hours prior to surgery.  No nicotine patches on the day of surgery.  Do not use any "recreational" drugs for at least a week prior to your surgery.  Please be advised that the combination of cocaine and anesthesia may have negative outcomes, up to and including death. If you test positive for cocaine, your surgery will be cancelled.  On the morning of surgery brush your teeth with toothpaste and water, you may rinse your mouth with mouthwash if you wish. Do not swallow any toothpaste or mouthwash.  Do not wear jewelry.    Do not wear lotions, powders, or perfumes.   Do not shave body from the neck down 48 hours prior to surgery just in case you cut yourself which could leave a site for infection.  Also, freshly shaved skin may become irritated if using the CHG soap.  Contact lenses, hearing aids and dentures may not be worn into surgery.  Do not bring valuables to the hospital. [facility] is not responsible for any missing/lost belongings or valuables.    Notify your doctor if there is any change in your medical condition (cold, fever, infection).  Wear comfortable clothing (specific to your surgery type) to the hospital.  After surgery, you can help prevent lung complications by doing breathing exercises.  Take deep breaths and cough every 1-2 hours. Your doctor may order a device called an Incentive Spirometer to help you take deep breaths.  If you are being admitted to the hospital overnight, leave your suitcase in the car. After surgery it may be brought to your room.  If you are being discharged the day of surgery, you will not be allowed to drive home. You will need a responsible adult (18 years or older) to drive you home and stay with you that night.   If you are taking public transportation, you will need to have a responsible adult (18 years or older) with you. Please confirm with your physician that it is acceptable to use public transportation.   Please call the [facility] at [phone] if you have any questions  about these instructions.  Surgery Visitation Policy:  Patients undergoing a surgery or procedure may have one family member or support person with them as long as that person is not COVID-19 positive or experiencing its symptoms.  That person may remain in the waiting area during the procedure and may rotate out with other people.  Inpatient Visitation:    Visiting hours are 7 a.m. to 8 p.m. Up to two visitors ages 16+ are allowed at one time in a  patient room. The visitors may rotate out with other people during the day. Visitors must check out when they leave, or other visitors will not be allowed. One designated support person may remain overnight. The visitor must pass COVID-19 screenings, use hand sanitizer when entering and exiting the patient's room and wear a mask at all times, including in the patient's room. Patients must also wear a mask when staff or their visitor are in the room. Masking is required regardless of vaccination status.

## 2020-10-08 ENCOUNTER — Telehealth: Payer: Self-pay

## 2020-10-08 ENCOUNTER — Encounter: Payer: Self-pay | Admitting: Urology

## 2020-10-08 ENCOUNTER — Ambulatory Visit (INDEPENDENT_AMBULATORY_CARE_PROVIDER_SITE_OTHER): Payer: Medicare HMO | Admitting: Urology

## 2020-10-08 VITALS — BP 123/72 | HR 130 | Temp 97.6°F | Ht 74.0 in | Wt 179.0 lb

## 2020-10-08 DIAGNOSIS — N201 Calculus of ureter: Secondary | ICD-10-CM

## 2020-10-08 DIAGNOSIS — R310 Gross hematuria: Secondary | ICD-10-CM

## 2020-10-08 DIAGNOSIS — R300 Dysuria: Secondary | ICD-10-CM

## 2020-10-08 MED ORDER — TAMSULOSIN HCL 0.4 MG PO CAPS: 0.4000 mg | ORAL_CAPSULE | Freq: Every day | ORAL | 0 refills | Status: DC

## 2020-10-08 NOTE — Progress Notes (Signed)
   10/08/2020 4:02 PM   [name] July 15, 1940 [id number]  Reason for visit: Follow up left ureteral stone  HPI: Comorbid 80 year old male who was admitted with left-sided flank pain secondary to an 8 mm left proximal ureteral stone, nausea, COPD exacerbation, and AKI and ultimately underwent left ureteral stent placement on 09/20/2020 with Dr. Abner Greenspan.  He required ICU transfer during that stay for pulmonary complications.  He was discharged on 09/28/2020.  I personally reviewed  He has noticed some blood in the urine over the last few days, as well as some urgency, frequency, and left-sided flank pain.  Urinalysis today without definite evidence of infection with 6-10 WBCs, greater than 30 RBCs, few bacteria.  He denies any fevers or chills  We discussed common symptoms with the ureteral stent in place, especially in the setting of his anticoagulation with Eliquis.  Will send urine for culture.  Flomax added for stent related symptoms.  We discussed the need for definitive management next week with left ureteroscopy, laser lithotripsy, stent placement.  Return precautions discussed extensively.  With his COPD and baseline oxygen he is at high risk for any procedures moving forward, but benefits of ureteroscopy and laser lithotripsy and stent removal significantly outweigh the risks of unmanaged stone and stent in place.  -Follow-up urine culture -Can use Pyridium/azo OTC for dysuria -Flomax added for stent related symptoms If worsening urgency/frequency and culture negative, can add samples of -Myrbetriq 50 mg daily -Keep scheduled follow-up next week for left ureteroscopy, laser lithotripsy of stone, stent placement    Billey Co, MD  [facility] [address], [place], [place] [address] [phone]

## 2020-10-08 NOTE — Telephone Encounter (Signed)
Incoming call from patient and his daughter. They are very concerned with the amount of blood they have seen in his urine, in addition the patient c/o RT flank pain not relieved with Tylenol, penis pain and nausea. Pt states he was not prescribed any medications post operatively to help with symptom relief. Pt denies fever, chills, and vomiting. Patient added to schedule today for further evaluation as he has upcoming surgery next week.

## 2020-10-09 LAB — URINALYSIS, COMPLETE
Bilirubin, UA: NEGATIVE
Specific Gravity, UA: 1.015 (ref 1.005–1.030)
pH, UA: 5 (ref 5.0–7.5)

## 2020-10-09 LAB — MICROSCOPIC EXAMINATION: RBC, Urine: >30 /hpf — AB (ref 0–2)

## 2020-10-11 ENCOUNTER — Encounter: Payer: Self-pay | Admitting: Urology

## 2020-10-11 NOTE — Progress Notes (Signed)
Perioperative Services  Pre-Admission/Anesthesia Testing Clinical Review  Date: 10/11/20  Patient Demographics:  Name: [name] Browning DOB:   11/27/1940 MRN:   [id number]  Planned Surgical Procedure(s):    Case: [id number] Date/Time: 10/16/20 1322   Procedure: CYSTOSCOPY/URETEROSCOPY/HOLMIUM LASER/STENT PLACEMENT (Left)   Anesthesia type: General   Pre-op diagnosis: left ureteral stone   Location: [facility] OR ROOM 10 / [facility]   Surgeons: Billey Co, MD     NOTE: Available PAT nursing documentation and vital signs have been reviewed. Clinical nursing staff has updated patient's PMH/PSHx, current medication list, and drug allergies/intolerances to ensure comprehensive history available to assist in medical decision making as it pertains to the aforementioned surgical procedure and anticipated anesthetic course. Extensive review of available clinical information performed. [place] PMH and PSHx updated with any diagnoses/procedures that  may have been inadvertently omitted during his intake with the pre-admission testing department's nursing staff.  Clinical Discussion:  [name] Browning is a 80 y.o. male who is submitted for pre-surgical anesthesia review and clearance prior to him undergoing the above procedure. Patient is a Former Smoker (46 pack years; quit 02/2003). Pertinent PMH includes: CAD, atrial fibrillation, angina, CVA/TIA, diastolic dysfunction, carotid atherosclerosis, aortic atherosclerosis, DVT, HTN, COPD, recent respiratory failure, GERD (on daily PPI), OA, cervical DDD, nephrolithiasis, anxiety.  Patient is followed by cardiology Ubaldo Glassing, MD). He was last seen in the cardiology clinic on 05/04/2020; notes reviewed.  At the time of his clinic visit, patient doing well overall from a cardiovascular perspective.  He denied any episodes of chest pain, however complained of fatigue and shortness of breath.  He denied any PND, orthopnea, palpitations, significant  peripheral edema, vertiginous symptoms, or presyncope/syncope.  PMH significant for cardiovascular diagnoses.  TTE performed on 05/19/2014 revealed normal left ventricular systolic function with mild LVH; LVEF 60 to 65%.  Diastolic parameters consistent with impaired left ventricular relaxation (G1DD).  There was trivial MR and mild TR noted.  No evidence of valvular stenosis.  Repeat study performed on 04/20/2015 remain grossly unchanged; LVEF 55 to 60%,  Myocardial perfusion imaging study performed on 05/20/2014 revealing normal left ventricular systolic function with an EF of 55 to 65%.  There was no evidence of significant stress-induced myocardial ischemia or arrhythmia.  Study determined to be normal and low risk.  Patient diagnosed with atrial fibrillation in 01/2019 in the setting of SARS-CoV-2.  Rate was controlled.  Following evaluation by cardiology, patient was discharged from the hospital on chronic anticoagulation with plans for outpatient follow-up.  Patient remains on daily chronic anticoagulation using apixaban and low-dose ASA; CHA2DS2-VASc Score = 6 (age x 2, HTN, previous CVA/thromboembolism history x 2, aortic plaque).  Rate and rhythm maintained without pharmacological intervention.  Blood pressure reasonably controlled at 132/64 on currently prescribed CCB, diuretic, and ARB therapies.  Patient is on a statin for his HLD.  Patient is not diabetic. Functional capacity, as defined by DASI, is documented as being >/= 4 METS.  No changes were made to his medication regimen.  Patient to follow-up with outpatient cardiology in 6 months or sooner if needed.  Patient found to have an obstructing 8 x 8 mm stone within the proximal LEFT ureter near the UPJ resulting in hydronephrosis.  Patient was taken for ureteroscopy and stent placement on 09/20/2020 by Dr. Rexene Alberts (urology).  Procedure complicated by patient developing severe respiratory distress 1 hour postoperatively.  CXR revealed  acute pulmonary edema.  Patient noted to be in atrial  fibrillation with RVR.  He was transferred to the ICU and placed on NIPPV.  Patient placed on amiodarone drip and received intravenous furosemide.  Interventions stabilized patient.  PCCM was consulted.  There was question as to possible aspiration pneumonia.  Patient treated with intravenous Unasyn.  Clinical course improved. Patient was discharged home in stable condition on 09/28/2020 (POD-8) with instructions to follow-up with outpatient pulmonology. Patient continued on antimicrobial therapy using a 3 days course of cefdinir.   Following recent admission to the hospital, patient was advised to follow-up with PCCM as an outpatient, however it was noted that patient had not done so.  Referral entered to PCCM by PAT APP.  Patient was seen in consult on 10/06/2020 by Dr. Christinia Gully, MD; notes reviewed.  Patient preserved ambulating approximately 175 feet on supplemental oxygen, however had to stop due to shortness of breath; lowest SPO2 94%.  No changes were made to his medication regimen.  Patient to follow-up with outpatient pulmonary medicine in 4 weeks for further evaluation and continuing care.  [name] Browning is scheduled for a LEFT CYSTOSCOPY/URETEROSCOPY/HOLMIUM LASER/STENT PLACEMENT on 10/16/2020 with Dr. Nickolas Madrid, MD. Given patient's past medical history significant for cardiovascular diagnoses, coupled with his recent post-surgical cardiopulmonary event, presurgical clearances were sought from both cardiology and pulmonary medicine.  Specialty clearances were obtained as follows.   Per pulmonary medicine Melvyn Novas, MD), "patient to follow-up in 4 weeks.  Hold off on surgery for now unless urgent".  Per cardiology Ubaldo Glassing, MD), "this patient is optimized for surgery and may proceed with the planned procedural course with a LOW risk of significant perioperative cardiovascular complications".  Again, this patient is on daily anticoagulation  therapy.  He has been instructed on recommendations from his cardiologist for holding his daily low-dose ASA for 5 days (last dose 10/10/2020) and his apixaban for 2 days (last dose 10/13/2020) prior to his procedure with plans to restart as soon as postoperatively respectively minimized by his primary attending surgeon.  Patient seen in follow-up consult on 10/08/2020 by urology; notes reviewed.  Per urologist Diamantina Providence, MD), "with his COPD and baseline oxygen he is at [place] risk for any procedures moving forward, but benefits of ureteroscopy, laser lithotripsy, and stent removal significantly outweigh the risks of unmanaged stone and stent in place".  Patient denies previous perioperative complications with anesthesia in the past. In review of the available records, it is noted that patient underwent a general anesthetic course here (ASA III) in 09/2020 without documented complications related to the actual anesthesia that he received. With that being said, it did develop the aforementioned respiratory distress episode requiring transfer to the ICU.   Vitals with BMI 10/08/2020 10/06/2020 09/28/2020  Height 6\' 2"  6\' 2"  -  Weight 179 lbs 179 lbs -  BMI 81.82 99.37 -  Systolic 169 678 938  Diastolic 72 64 71  Pulse 101 84 80    Providers/Specialists:   NOTE: Primary physician provider listed below. Patient may have been seen by APP or partner within same practice.   PROVIDER ROLE / SPECIALTY LAST Ranae Pila, MD UROLOGY (SURGEON) 10/08/2020  [facility] PRIMARY CARE PROVIDER ???  Bartholome Bill, MD CARDIOLOGY 05/04/2020  Christinia Gully, MD PULMONARY MEDICINE 10/06/2020   Allergies:  Hydromorphone  Current Home Medications:   No current facility-administered medications for this encounter.    acetaminophen (TYLENOL) 500 MG tablet   amLODipine (NORVASC) 5 MG tablet   apixaban (ELIQUIS) 5 MG TABS tablet  aspirin EC 81 MG tablet   atorvastatin (LIPITOR) 40 MG tablet    Ferrous Gluconate (IRON) 240 (27 Fe) MG TABS   furosemide (LASIX) 20 MG tablet   guaiFENesin-dextromethorphan (ROBITUSSIN DM) 100-10 MG/5ML syrup   losartan (COZAAR) 100 MG tablet   montelukast (SINGULAIR) 10 MG tablet   pantoprazole (PROTONIX) 40 MG tablet   phenazopyridine (PYRIDIUM) 200 MG tablet   polyethylene glycol (MIRALAX / GLYCOLAX) 17 g packet   SYMBICORT 160-4.5 MCG/ACT inhaler   famotidine (PEPCID) 20 MG tablet   predniSONE (DELTASONE) 10 MG tablet   tamsulosin (FLOMAX) 0.4 MG CAPS capsule   History:   Past Medical History:  Diagnosis Date   Anginal pain (HCC)    Anxiety    Aortic atherosclerosis (HCC)    Atrial fibrillation ([place]) 01/2019   Bilateral carpal tunnel syndrome    CAD (coronary artery disease)    a.) LHC 2004 --> normal coronaries. b.) normal stress test in 2007 and 2011; c.) Lexiscan 05/20/2014 --> LVEF 55-65%; no significant stress induced ischemia/arrythmia. d.) CT chest 03/25/2019 --> coronaries carcified.   Carpal tunnel syndrome, bilateral    Chronic anticoagulation    a.) ASA + apixaban   COPD (chronic obstructive pulmonary disease) (HCC)    CVA (cerebral vascular accident) ([place])    Degenerative disc disease, cervical    Diastolic dysfunction    a.) TTE 05/29/2014 --> LVEF 60-65%; G1DD.   DVT (deep venous thrombosis) (HCC)    GERD (gastroesophageal reflux disease)    History of 2019 novel coronavirus disease (COVID-19) 02/08/2019   HLD (hyperlipidemia)    Hypertension    Kidney stones    Osteoarthritis of right shoulder    Pneumonia    Respiratory failure, acute ([place]) 09/20/2020   a.) severe respiratory distress 1 hour after urological surgery. CXR (+) for acute pulmonary edema. Transferred to ICU and placed on NIPPV. Questionable aspiration PNA. (+) A.fib with RVR. Improved with ABX, diuresis, and amiodarone.   Skin cancer of face    a.) RIGHT ear and RIGHT forehead; excised.   Syncope    TIA (transient ischemic attack) 2016   Valvular  regurgitation    a.) TTE 05/26/2014 --> LVEF 60-65%; trivial MR, mild TR; no AR or PR. b.) TTE 04/20/2015 --> LVEF 55-60%; trivial MR and PR; no AR or TR.   Past Surgical History:  Procedure Laterality Date   CARDIAC CATHETERIZATION  2004   CARPAL TUNNEL RELEASE Right 06/11/2013   CYSTOSCOPY W/ URETERAL STENT PLACEMENT Left 09/20/2020   Procedure: CYSTOSCOPY WITH RETROGRADE PYELOGRAM/URETERAL STENT PLACEMENT;  Surgeon: Janith Lima, MD;  Location: [facility] ORS;  Service: Urology;  Laterality: Left;   SHOULDER ARTHROSCOPY WITH OPEN ROTATOR CUFF REPAIR Right 08/24/2017   Procedure: SHOULDER ARTHROSCOPY WITH OPEN ROTATOR CUFF REPAIR;  Surgeon: Corky Mull, MD;  Location: [facility] ORS;  Service: Orthopedics;  Laterality: Right;   SKIN CANCER EXCISION  12/01/2016   right ear    SKIN CANCER EXCISION     remove from the right side of the face    THROMBECTOMY Right 2004   leg   THYROIDECTOMY  1950   Not sure if total or partial thyroidectomy.   Family History  Problem Relation Age of Onset   Hypertension Mother    Heart disease Mother    CAD Father    Heart attack Father    Social History   Tobacco Use   Smoking status: Former    Packs/day: 1.00    Years: 46.00  Pack years: 46.00    Types: Cigarettes    Start date: 01/10/1957    Quit date: 02/11/2003    Years since quitting: 17.6   Smokeless tobacco: Former    Types: Snuff    Quit date: 02/11/2003  Vaping Use   Vaping Use: Never used  Substance Use Topics   Alcohol use: Not Currently    Alcohol/week: 7.0 standard drinks    Types: 7 Cans of beer per week   Drug use: No    Pertinent Clinical Results:  LABS: Labs reviewed: Acceptable for surgery.  Lab Results  Component Value Date   WBC 14.1 (H) 10/06/2020   HGB 13.9 10/06/2020   HCT 43.5 10/06/2020   MCV 95.4 10/06/2020   PLT 321 10/06/2020   Lab Results  Component Value Date   NA 140 10/06/2020   K 4.8 10/06/2020   CO2 31 10/06/2020   GLUCOSE 95 10/06/2020   BUN 23  10/06/2020   CREATININE 0.99 10/06/2020   CALCIUM 9.2 10/06/2020   GFRNONAA >60 10/06/2020   Lab Results  Component Value Date   COLORURINE YELLOW 09/18/2020   APPEARANCEUR Cloudy (A) 10/08/2020   LABSPEC 1.010 09/18/2020   PHURINE 5.0 09/18/2020   GLUCOSEU 1+ (A) 10/08/2020   HGBUR LARGE (A) 09/18/2020   BILIRUBINUR Negative 10/08/2020   KETONESUR NEGATIVE 09/18/2020   PROTEINUR CANCELED 10/08/2020   NITRITE NEGATIVE 09/18/2020   LEUKOCYTESUR NEGATIVE 09/18/2020   EPIU 0-5 09/18/2020   WBCU 6-10 09/18/2020   RBCU 3+ (A) 10/08/2020   BACTERIA Few 10/08/2020    ECG: Date: 09/20/2020 Time ECG obtained: 1657 PM Rate: 109 bpm Rhythm: atrial fibrillation Intervals: QRS 77 ms. QTc 381 ms. ST segment and T wave changes: No evidence of acute ST segment elevation or depression.   Comparison: Similar to previous tracing obtained on 09/18/2020; rate has increased.    IMAGING / PROCEDURES: TRANSTHORACIC ECHOCARDIOGRAM performed on 04/20/2015 LVEF 55 to 60% Left ventricular systolic function normal left ventricular cavity size normal No regional wall motion abnormalities Diastolic Doppler parameters consistent with abnormal left ventricular relaxation (G1DD) Right ventricular systolic function normal PASP within normal range  LEXISCAN performed on 05/20/2014 LVEF 55 to 60% Left ventricular systolic function normal No evidence of stress-induced myocardial ischemia or arrhythmia Normal low risk study  TRANSTHORACIC ECHOCARDIOGRAM performed on 05/19/2014 LVEF normal at 60 to 65% Left ventricular systolic function normal Mild concentric LVH No regional wall motion abnormalities Diastolic Doppler parameters consistent with abnormal left ventricular relaxation (G1DD) Mild tricuspid valve regurgitation No pericardial effusion  BILATERAL CAROTID DOPPLER performed on 05/19/2014 Moderate amount of BILATERAL intimal thickening and atherosclerotic plaque, morphologically similar to  the 08/2013 examination and again not resulting in a hemodynamically significant stenosis  Impression and Plan:  TERYN GUST has been referred for pre-anesthesia review and clearance prior to him undergoing the planned anesthetic and procedural courses. Available labs, pertinent testing, and imaging results were personally reviewed by me. This patient has been appropriately cleared by cardiology with an overall LOW risk of significant perioperative cardiovascular complications.  Pulmonary medicine recommending that procedure be postponed unless urgent.  Risk versus benefits associated with procedure weighed by the primary attending surgeon.  Urology feels that despite HIGH cardiopulmonary risk, the benefits of proceeding outweigh the associated risk of the patient's unmanaged stone and stent that he has been placed.  Patient discussed with attending anesthesiologist on call Wynetta Emery, MD) who advised that since the case has been deemed urgent by urology, the risks and benefits  have been taken into account, and proceeding is in the best interest of the patient.   Based on clinical review performed today (10/11/20), barring any significant acute changes in the patient's overall condition, it is anticipated that he will be able to proceed with the planned surgical intervention. It has been established that the patient is at [place] risk for this procedure given his overall respiratory status and previous pulmonary event following his urological procedure on 09/20/2020. With that being said, the provider has discussed these risks with the patient, along with his recommendations, and the patient has elected to proceed. Any acute changes in clinical condition may necessitate his procedure being postponed and/or cancelled. Patient will meet with anesthesia team (MD and/or CRNA) on the day of his procedure for preoperative evaluation/assessment. Questions regarding anesthetic course will be fielded at that time.    Pre-surgical instructions were reviewed with the patient during his PAT appointment and questions were fielded by PAT clinical staff. Patient was advised that if any questions or concerns arise prior to his procedure then he should return a call to PAT and/or his surgeon's office to discuss.  Honor Loh, MSN, APRN, FNP-C, CEN [facility]  Peri-operative Services Nurse Practitioner Phone: [phone] Fax: [phone] 10/11/20 12:48 PM  NOTE: This note has been prepared using Dragon dictation software. Despite my best ability to proofread, there is always the potential that unintentional transcriptional errors may still occur from this process.

## 2020-10-12 NOTE — Anesthesia Preprocedure Evaluation (Addendum)
Anesthesia Evaluation  Patient identified by MRN, date of birth, ID band Patient awake    Reviewed: Allergy & Precautions, H&P , NPO status , Patient's Chart, lab work & pertinent test results, reviewed documented beta blocker date and time   History of Anesthesia Complications Negative for: history of anesthetic complications  Airway Mallampati: II  TM Distance: >3 FB Neck ROM: Full    Dental  (+) Edentulous Upper, Edentulous Lower   Pulmonary shortness of breath and with exertion, pneumonia, COPD,  COPD inhaler and oxygen dependent, former smoker,  Recent respiratory failure post-procedure 9/11 due to COPD exacerbation and Community-acquired pneumonia   Pulmonary exam normal breath sounds clear to auscultation- rhonchi (-) wheezing      Cardiovascular Exercise Tolerance: Poor hypertension, On Medications + CAD and +CHF (diastolic dysfunction)  (-) Past MI, (-) Cardiac Stents and (-) CABG Normal cardiovascular exam+ dysrhythmias Atrial Fibrillation  Rhythm:Irregular Rate:Normal - Systolic murmurs and - Diastolic murmurs carotid atherosclerosis, aortic atherosclerosis  Patient preserved ambulating approximately 175 feet on supplemental oxygen, however had to stop due to shortness of breath; lowest SPO2 94%.   Neuro/Psych Anxiety TIA Neuromuscular disease negative neurological ROS  negative psych ROS   GI/Hepatic Neg liver ROS, GERD  Medicated,  Endo/Other  negative endocrine ROSneg diabetesRecent prednisone taper  Renal/GU Renal InsufficiencyRenal diseaseObstructing left proximal ureter stone with left-sided hydronephrosis Acute kidney injury secondary to obstruction. CKD stage II  negative genitourinary   Musculoskeletal  (+) Arthritis , cervical DDD   Abdominal (+) - obese,   Peds  Hematology negative hematology ROS (+)   Anesthesia Other Findings Pt had stent placed on 09/11 and 1 hours post-procedure he  developed severe respiratory distress. He was transferred to the ICU and placed on NIPPV for acute atelectasis with interstitial edema. He also had A.fib. He was started on amio and diuresed and he stabilized. Question aspiration PNA. Tx'd with Unasyn. Treated with steroids for COPD exacerbation. Improved and was discharged home 9/19 with the recommendations of following with with PCCM. He was seen on the 27th by Dr. Melvyn Novas [facility]) who advised, "f/u 4 weeks, Hold off on surgery for now unless urgent". He followed up with urology on the 29th for pain and bleeding. Plans are to proceed with stenting and litho. Urology advising, "with his COPD and baseline oxygen he is at high risk for any procedures moving forward, but benefits of ureteroscopy and laser lithotripsy and stent removal significantly outweigh the risks of unmanaged stone and stent in place". Cards has signed off (low risk).    - TTE performed on 05/19/2014 revealed normal left ventricular systolic function with mild LVH; LVEF 60 to 65%.  Diastolic parameters consistent with impaired left ventricular relaxation (G1DD).  There was trivial MR and mild TR noted.  No evidence of valvular stenosis.  Repeat study performed on 04/20/2015 remain grossly unchanged; LVEF 55 to 60%,  - Myocardial perfusion imaging study performed on 05/20/2014 revealing normal left ventricular systolic function with an EF of 55 to 65%.  There was no evidence of significant stress-induced myocardial ischemia or arrhythmia.  Study determined to be normal and low risk.   Reproductive/Obstetrics negative OB ROS                           Anesthesia Physical  Anesthesia Plan  ASA: 4  Anesthesia Plan: General   Post-op Pain Management:    Induction: Intravenous  PONV Risk Score and Plan: 1 and Ondansetron  and Dexamethasone  Airway Management Planned: Oral ETT  Additional Equipment:   Intra-op Plan:   Post-operative Plan: Extubation in  OR  Informed Consent: I have reviewed the patients History and Physical, chart, labs and discussed the procedure including the risks, benefits and alternatives for the proposed anesthesia with the patient or authorized representative who has indicated his/her understanding and acceptance.     Dental advisory given  Plan Discussed with: CRNA and Anesthesiologist  Anesthesia Plan Comments:        Anesthesia Quick Evaluation

## 2020-10-14 LAB — CULTURE, URINE COMPREHENSIVE

## 2020-10-16 ENCOUNTER — Encounter: Payer: Self-pay | Admitting: Urology

## 2020-10-16 ENCOUNTER — Ambulatory Visit
Admission: RE | Admit: 2020-10-16 | Discharge: 2020-10-16 | Disposition: A | Discharge status: Home / Self Care | Payer: Medicare HMO | Source: Ambulatory Visit | Attending: Urology | Admitting: Urology

## 2020-10-16 ENCOUNTER — Ambulatory Visit: Payer: Medicare HMO | Admitting: Urgent Care

## 2020-10-16 ENCOUNTER — Encounter
Admission: RE | Disposition: A | Discharge status: Home / Self Care | Payer: Self-pay | Source: Ambulatory Visit | Attending: Urology

## 2020-10-16 ENCOUNTER — Other Ambulatory Visit: Payer: Self-pay

## 2020-10-16 ENCOUNTER — Ambulatory Visit: Payer: Medicare HMO

## 2020-10-16 DIAGNOSIS — Z87891 Personal history of nicotine dependence: Secondary | ICD-10-CM | POA: Diagnosis not present

## 2020-10-16 DIAGNOSIS — N201 Calculus of ureter: Secondary | ICD-10-CM

## 2020-10-16 DIAGNOSIS — N179 Acute kidney failure, unspecified: Secondary | ICD-10-CM | POA: Insufficient documentation

## 2020-10-16 DIAGNOSIS — J441 Chronic obstructive pulmonary disease with (acute) exacerbation: Secondary | ICD-10-CM | POA: Diagnosis not present

## 2020-10-16 DIAGNOSIS — Z8673 Personal history of transient ischemic attack (TIA), and cerebral infarction without residual deficits: Secondary | ICD-10-CM | POA: Diagnosis not present

## 2020-10-16 DIAGNOSIS — Z86718 Personal history of other venous thrombosis and embolism: Secondary | ICD-10-CM | POA: Insufficient documentation

## 2020-10-16 DIAGNOSIS — N202 Calculus of kidney with calculus of ureter: Secondary | ICD-10-CM | POA: Insufficient documentation

## 2020-10-16 DIAGNOSIS — Z8616 Personal history of COVID-19: Secondary | ICD-10-CM | POA: Insufficient documentation

## 2020-10-16 DIAGNOSIS — Z885 Allergy status to narcotic agent status: Secondary | ICD-10-CM | POA: Diagnosis not present

## 2020-10-16 DIAGNOSIS — F419 Anxiety disorder, unspecified: Secondary | ICD-10-CM | POA: Diagnosis not present

## 2020-10-16 HISTORY — DX: Angina pectoris, unspecified: I20.9

## 2020-10-16 HISTORY — DX: Cerebral infarction, unspecified: I63.9

## 2020-10-16 HISTORY — DX: Unspecified malignant neoplasm of skin of unspecified part of face: C44.300

## 2020-10-16 HISTORY — DX: Endocarditis, valve unspecified: I38

## 2020-10-16 HISTORY — DX: Occlusion and stenosis of bilateral carotid arteries: I65.23

## 2020-10-16 HISTORY — PX: CYSTOSCOPY/URETEROSCOPY/HOLMIUM LASER/STENT PLACEMENT: SHX6546

## 2020-10-16 HISTORY — DX: Other ill-defined heart diseases: I51.89

## 2020-10-16 HISTORY — PX: CYSTOSCOPY W/ RETROGRADES: SHX1426

## 2020-10-16 LAB — TROPONIN I (HIGH SENSITIVITY): Troponin I (High Sensitivity): 20 ng/L — ABNORMAL HIGH (ref <18)

## 2020-10-16 SURGERY — CYSTOSCOPY/URETEROSCOPY/HOLMIUM LASER/STENT PLACEMENT
Anesthesia: General | Laterality: Left

## 2020-10-16 MED ORDER — ROCURONIUM BROMIDE 10 MG/ML (PF) SYRINGE
PREFILLED_SYRINGE | INTRAVENOUS | Status: AC
  Filled 2020-10-16: qty 10

## 2020-10-16 MED ORDER — BELLADONNA ALKALOIDS-OPIUM 16.2-60 MG RE SUPP
RECTAL | Status: DC | PRN
  Administered 2020-10-16: 1 via RECTAL

## 2020-10-16 MED ORDER — IOHEXOL 180 MG/ML  SOLN
INTRAMUSCULAR | Status: DC | PRN
  Administered 2020-10-16: 10 mL

## 2020-10-16 MED ORDER — ONDANSETRON HCL 4 MG/2ML IJ SOLN
INTRAMUSCULAR | Status: AC
  Filled 2020-10-16: qty 2

## 2020-10-16 MED ORDER — CEFAZOLIN SODIUM-DEXTROSE 2-4 GM/100ML-% IV SOLN
INTRAVENOUS | Status: AC
  Filled 2020-10-16: qty 100

## 2020-10-16 MED ORDER — FENTANYL CITRATE (PF) 100 MCG/2ML IJ SOLN
INTRAMUSCULAR | Status: DC | PRN
  Administered 2020-10-16 (×2): 25 ug via INTRAVENOUS

## 2020-10-16 MED ORDER — LIDOCAINE HCL (CARDIAC) PF 100 MG/5ML IV SOSY
PREFILLED_SYRINGE | INTRAVENOUS | Status: DC | PRN
  Administered 2020-10-16: 90 mg via INTRAVENOUS

## 2020-10-16 MED ORDER — DEXAMETHASONE SODIUM PHOSPHATE 10 MG/ML IJ SOLN
INTRAMUSCULAR | Status: AC
  Filled 2020-10-16: qty 1

## 2020-10-16 MED ORDER — SODIUM CHLORIDE 0.9 % IR SOLN
Status: DC | PRN
  Administered 2020-10-16: 3000 mL

## 2020-10-16 MED ORDER — ROCURONIUM BROMIDE 100 MG/10ML IV SOLN
INTRAVENOUS | Status: DC | PRN
  Administered 2020-10-16: 50 mg via INTRAVENOUS

## 2020-10-16 MED ORDER — PROPOFOL 10 MG/ML IV BOLUS
INTRAVENOUS | Status: DC | PRN
  Administered 2020-10-16: 120 mg via INTRAVENOUS

## 2020-10-16 MED ORDER — CEFAZOLIN SODIUM-DEXTROSE 2-4 GM/100ML-% IV SOLN
2.0000 g | INTRAVENOUS | Status: AC
  Administered 2020-10-16: 2 g via INTRAVENOUS

## 2020-10-16 MED ORDER — CHLORHEXIDINE GLUCONATE 0.12 % MT SOLN
OROMUCOSAL | Status: AC
  Administered 2020-10-16: 15 mL via OROMUCOSAL
  Filled 2020-10-16: qty 15

## 2020-10-16 MED ORDER — BELLADONNA ALKALOIDS-OPIUM 16.2-60 MG RE SUPP
RECTAL | Status: AC
  Filled 2020-10-16: qty 1

## 2020-10-16 MED ORDER — LACTATED RINGERS IV SOLN: INTRAVENOUS | Status: DC

## 2020-10-16 MED ORDER — ONDANSETRON HCL 4 MG/2ML IJ SOLN
INTRAMUSCULAR | Status: DC | PRN
  Administered 2020-10-16: 4 mg via INTRAVENOUS

## 2020-10-16 MED ORDER — PROPOFOL 500 MG/50ML IV EMUL
INTRAVENOUS | Status: AC
  Filled 2020-10-16: qty 50

## 2020-10-16 MED ORDER — FENTANYL CITRATE (PF) 100 MCG/2ML IJ SOLN
INTRAMUSCULAR | Status: AC
  Filled 2020-10-16: qty 2

## 2020-10-16 MED ORDER — NITROFURANTOIN MACROCRYSTAL 50 MG PO CAPS: 50.0000 mg | ORAL_CAPSULE | Freq: Every day | ORAL | 0 refills | Status: DC

## 2020-10-16 MED ORDER — ONDANSETRON HCL 4 MG/2ML IJ SOLN: 4.0000 mg | Freq: Once | INTRAMUSCULAR | Status: DC | PRN

## 2020-10-16 MED ORDER — SUGAMMADEX SODIUM 200 MG/2ML IV SOLN
INTRAVENOUS | Status: DC | PRN
  Administered 2020-10-16: 200 mg via INTRAVENOUS

## 2020-10-16 MED ORDER — EPHEDRINE SULFATE 50 MG/ML IJ SOLN
INTRAMUSCULAR | Status: DC | PRN
  Administered 2020-10-16 (×2): 10 mg via INTRAVENOUS

## 2020-10-16 MED ORDER — DEXAMETHASONE SODIUM PHOSPHATE 10 MG/ML IJ SOLN
INTRAMUSCULAR | Status: DC | PRN
  Administered 2020-10-16: 8 mg via INTRAVENOUS

## 2020-10-16 MED ORDER — ORAL CARE MOUTH RINSE: 15.0000 mL | Freq: Once | OROMUCOSAL | Status: AC

## 2020-10-16 MED ORDER — FENTANYL CITRATE (PF) 100 MCG/2ML IJ SOLN: 25.0000 ug | INTRAMUSCULAR | Status: DC | PRN

## 2020-10-16 MED ORDER — PHENYLEPHRINE HCL (PRESSORS) 10 MG/ML IV SOLN
INTRAVENOUS | Status: DC | PRN
  Administered 2020-10-16 (×2): 100 ug via INTRAVENOUS
  Administered 2020-10-16 (×2): 200 ug via INTRAVENOUS

## 2020-10-16 MED ORDER — CHLORHEXIDINE GLUCONATE 0.12 % MT SOLN: 15.0000 mL | Freq: Once | OROMUCOSAL | Status: AC

## 2020-10-16 SURGICAL SUPPLY — 31 items
BAG DRAIN CYSTO-URO LG1000N (MISCELLANEOUS) ×3 IMPLANT
BRUSH SCRUB EZ 1% IODOPHOR (MISCELLANEOUS) ×3 IMPLANT
CATH URET FLEX-TIP 2 LUMEN 10F (CATHETERS) IMPLANT
CATH URETL OPEN 5X70 (CATHETERS) IMPLANT
CNTNR SPEC 2.5X3XGRAD LEK (MISCELLANEOUS)
CONT SPEC 4OZ STER OR WHT (MISCELLANEOUS)
CONT SPEC 4OZ STRL OR WHT (MISCELLANEOUS)
CONTAINER SPEC 2.5X3XGRAD LEK (MISCELLANEOUS) IMPLANT
DRAPE UTILITY 15X26 TOWEL STRL (DRAPES) ×3 IMPLANT
DRSG TEGADERM 2-3/8X2-3/4 SM (GAUZE/BANDAGES/DRESSINGS) ×3 IMPLANT
GAUZE 4X4 16PLY ~~LOC~~+RFID DBL (SPONGE) ×6 IMPLANT
GLOVE SURG UNDER POLY LF SZ7.5 (GLOVE) ×3 IMPLANT
GOWN STRL REUS W/ TWL LRG LVL3 (GOWN DISPOSABLE) ×2 IMPLANT
GOWN STRL REUS W/ TWL XL LVL3 (GOWN DISPOSABLE) ×2 IMPLANT
GOWN STRL REUS W/TWL LRG LVL3 (GOWN DISPOSABLE) ×3
GOWN STRL REUS W/TWL XL LVL3 (GOWN DISPOSABLE) ×3
GUIDEWIRE STR DUAL SENSOR (WIRE) ×6 IMPLANT
INFUSOR MANOMETER BAG 3000ML (MISCELLANEOUS) ×3 IMPLANT
IV NS IRRIG 3000ML ARTHROMATIC (IV SOLUTION) ×3 IMPLANT
KIT TURNOVER CYSTO (KITS) ×3 IMPLANT
PACK CYSTO AR (MISCELLANEOUS) ×3 IMPLANT
SET CYSTO W/LG BORE CLAMP LF (SET/KITS/TRAYS/PACK) ×3 IMPLANT
SHEATH URETERAL 12FRX35CM (MISCELLANEOUS) IMPLANT
STENT URET 6FRX24 CONTOUR (STENTS) IMPLANT
STENT URET 6FRX26 CONTOUR (STENTS) ×3 IMPLANT
SURGILUBE 2OZ TUBE FLIPTOP (MISCELLANEOUS) ×3 IMPLANT
SYR 10ML LL (SYRINGE) ×3 IMPLANT
TRACTIP FLEXIVA PULSE ID 200 (Laser) ×3 IMPLANT
VALVE UROSEAL ADJ ENDO (VALVE) ×3 IMPLANT
WATER STERILE IRR 1000ML POUR (IV SOLUTION) ×3 IMPLANT
WATER STERILE IRR 500ML POUR (IV SOLUTION) ×3 IMPLANT

## 2020-10-16 NOTE — Progress Notes (Signed)
1542- patient c/o chest pain level 3/10 oin pain scale 0-10. Patient says that this happens at home also. Notified Dr. Rosey Bath of patient c/o chest pain. Order for 12 lead EKG received and performed.  1550- patient says that chest pain is relieved. Will continue to monitor. Dr. Rosey Bath ordered troponin x1 and consult call with Dr. Humphrey Rolls, cardiologist.

## 2020-10-16 NOTE — Progress Notes (Signed)
Patient has been updated that patient is doing well. Per Dr. Rosey Bath patient is okay to go home per troponin results.

## 2020-10-16 NOTE — Transfer of Care (Signed)
Immediate Anesthesia Transfer of Care Note  Patient: [name] Browning  Procedure(s) Performed: CYSTOSCOPY/URETEROSCOPY/HOLMIUM LASER/STENT PLACEMENT (Left) CYSTOSCOPY WITH RETROGRADE PYELOGRAM  Patient Location: PACU  Anesthesia Type:General  Level of Consciousness: awake  Airway & Oxygen Therapy: Patient Spontanous Breathing and Patient connected to face mask oxygen  Post-op Assessment: Report given to RN and Post -op Vital signs reviewed and stable  Post vital signs: Reviewed and stable  Last Vitals:  Vitals Value Taken Time  BP 116/66   Temp    Pulse 57 10/16/20 1505  Resp 15 10/16/20 1505  SpO2 100 % 10/16/20 1505  Vitals shown include unvalidated device data.  Last Pain:  Vitals:   10/16/20 1235  TempSrc: Oral  PainSc: 0-No pain         Complications: No notable events documented.

## 2020-10-16 NOTE — Anesthesia Procedure Notes (Signed)
Procedure Name: Intubation Date/Time: 10/16/2020 2:15 PM Performed by: Lia Foyer, CRNA Pre-anesthesia Checklist: Patient identified, Patient being monitored, Timeout performed, Emergency Drugs available and Suction available Patient Re-evaluated:Patient Re-evaluated prior to induction Oxygen Delivery Method: Circle system utilized Preoxygenation: Pre-oxygenation with 100% oxygen Induction Type: IV induction Ventilation: Mask ventilation without difficulty Laryngoscope Size: McGraph and 4 Grade View: Grade I Tube type: Oral Tube size: 7.5 mm Number of attempts: 1 Airway Equipment and Method: Stylet and Video-laryngoscopy Placement Confirmation: ETT inserted through vocal cords under direct vision, positive ETCO2 and breath sounds checked- equal and bilateral Secured at: 22 cm Tube secured with: Tape Dental Injury: Teeth and Oropharynx as per pre-operative assessment

## 2020-10-16 NOTE — Progress Notes (Signed)
Patient is resting comfortably without any c/o chest pain. Dr. Humphrey Rolls said no further intervention or evaluation is needed per Dr. Rosey Bath phone call. Patient cardiology appt set up by MD Dr. Humphrey Rolls and will f/u on Monday. Appt address, date, and time is in patient d/c instructions

## 2020-10-16 NOTE — H&P (Signed)
10/16/20 1:47 PM   [name] Browning Sep 05, 1940 [id number]  CC: Left ureteral stone  HPI: Comorbid 80 year old male who was admitted with left-sided flank pain secondary to an 8 mm left proximal ureteral stone, nausea, COPD exacerbation, and AKI and ultimately underwent left ureteral stent placement on 09/20/2020 with Dr. Abner Greenspan.  He required ICU transfer during that stay for pulmonary complications.  He was discharged on 09/28/2020.  Presents today for definitive management with ureteroscopy   PMH: Past Medical History:  Diagnosis Date   Anginal pain ([place])    Anxiety    Aortic atherosclerosis ([place])    Atrial fibrillation ([place]) 01/2019   Bilateral carpal tunnel syndrome    CAD (coronary artery disease)    a.) LHC 2004 --> normal coronaries. b.) normal stress test in 2007 and 2011; c.) Lexiscan 05/20/2014 --> LVEF 55-65%; no significant stress induced ischemia/arrythmia. d.) CT chest 03/25/2019 --> coronaries carcified.   Carotid atherosclerosis, bilateral    Carpal tunnel syndrome, bilateral    Chronic anticoagulation    a.) ASA + apixaban   COPD (chronic obstructive pulmonary disease) (HCC)    CVA (cerebral vascular accident) ([place])    Degenerative disc disease, cervical    Diastolic dysfunction    a.) TTE 05/29/2014 --> LVEF 60-65%; G1DD.   DVT (deep venous thrombosis) ([facility])    GERD (gastroesophageal reflux disease)    History of 2019 novel coronavirus disease (COVID-19) 02/08/2019   HLD (hyperlipidemia)    Hypertension    Kidney stones    Osteoarthritis of right shoulder    Pneumonia    Respiratory failure, acute ([place]) 09/20/2020   a.) severe respiratory distress 1 hour after urological surgery. CXR (+) for acute pulmonary edema. Transferred to ICU and placed on NIPPV. Questionable aspiration PNA. (+) A.fib with RVR. Improved with ABX, diuresis, and amiodarone.   Skin cancer of face    a.) RIGHT ear and RIGHT forehead; excised.   Syncope    TIA (transient ischemic attack) 2016    Valvular regurgitation    a.) TTE 05/26/2014 --> LVEF 60-65%; trivial MR, mild TR; no AR or PR. b.) TTE 04/20/2015 --> LVEF 55-60%; trivial MR and PR; no AR or TR.    Surgical History: Past Surgical History:  Procedure Laterality Date   CARDIAC CATHETERIZATION  2004   CARPAL TUNNEL RELEASE Right 06/11/2013   CYSTOSCOPY W/ URETERAL STENT PLACEMENT Left 09/20/2020   Procedure: CYSTOSCOPY WITH RETROGRADE PYELOGRAM/URETERAL STENT PLACEMENT;  Surgeon: Janith Lima, MD;  Location: [facility] ORS;  Service: Urology;  Laterality: Left;   SHOULDER ARTHROSCOPY WITH OPEN ROTATOR CUFF REPAIR Right 08/24/2017   Procedure: SHOULDER ARTHROSCOPY WITH OPEN ROTATOR CUFF REPAIR;  Surgeon: Corky Mull, MD;  Location: [facility] ORS;  Service: Orthopedics;  Laterality: Right;   SKIN CANCER EXCISION  12/01/2016   right ear    SKIN CANCER EXCISION     remove from the right side of the face    THROMBECTOMY Right 2004   leg   THYROIDECTOMY  1950   Not sure if total or partial thyroidectomy.     Family History: Family History  Problem Relation Age of Onset   Hypertension Mother    Heart disease Mother    CAD Father    Heart attack Father     Social History:  reports that he quit smoking about 17 years ago. His smoking use included cigarettes. He started smoking about 63 years ago. He has a 46.00 pack-year smoking history. He quit smokeless tobacco use  about 17 years ago.  His smokeless tobacco use included snuff. He reports that he does not currently use alcohol after a past usage of about 7.0 standard drinks per week. He reports that he does not use drugs.  Physical Exam: BP 118/72   Pulse (!) 104   Temp 97.8 F (36.6 C) (Oral)   Resp 16   SpO2 98%    Constitutional:  Alert and oriented, No acute distress. Cardiovascular: Regular rate and rhythm Respiratory: Clear to auscultation bilaterally GI: Abdomen is soft, nontender, nondistended, no abdominal masses   Laboratory Data: Urine culture 10/08/2020  no growth   Assessment & Plan:   80 year old comorbid male who originally presented with an 8 mm left proximal ureteral stone and multiple left renal stones and underwent stent placement for AKI in addition to COPD exacerbation requiring ICU stay.  Here today for definitive management with ureteroscopy and laser lithotripsy.  We specifically discussed the risks ureteroscopy including bleeding, infection/sepsis, stent related symptoms including flank pain/urgency/frequency/incontinence/dysuria, ureteral injury, inability to access stone, or need for staged or additional procedures.  Left ureteroscopy, laser lithotripsy, stent placement   Nickolas Madrid, MD 10/16/2020  [place] [address], [place], [place] [address] [phone]

## 2020-10-16 NOTE — Op Note (Signed)
Date of procedure: 10/16/20  Preoperative diagnosis:  Left ureteral stone  Postoperative diagnosis:  Same  Procedure: Cystoscopy, left ureteroscopy, laser lithotripsy, left retrograde pyelogram with intraoperative interpretation, left ureteral stent placement  Surgeon: Nickolas Madrid, MD  Anesthesia: General  Complications: None  Intraoperative findings:  Normal cystoscopy, left ureteral stone had been pushed back into the lower pole, and both lower pole stones were dusted.  Very hard black calcium oxalate monohydrate stones.  Uncomplicated stent placement with Dangler  EBL: Minimal  Specimens: None  Drains: Left 6 French by 26 cm ureteral stent with Dangler  Indication: [name] Browning is a 80 y.o. patient with left proximal ureteral stone that was previously stented for AKI.  After reviewing the management options for treatment, they elected to proceed with the above surgical procedure(s). We have discussed the potential benefits and risks of the procedure, side effects of the proposed treatment, the likelihood of the patient achieving the goals of the procedure, and any potential problems that might occur during the procedure or recuperation. Informed consent has been obtained.  Description of procedure:  The patient was taken to the operating room and general anesthesia was induced. SCDs were placed for DVT prophylaxis. The patient was placed in the dorsal lithotomy position, prepped and draped in the usual sterile fashion, and preoperative antibiotics(Ancef) were administered. A preoperative time-out was performed.   A 21 French rigid cystoscope was used to intubate the urethra and a normal-appearing urethra was followed proximally in the bladder.  The bladder was grossly normal.  A sensor wire was advanced along the left ureteral stent up to the kidney under fluoroscopic vision.  The left ureteral stent was then grasped and pulled to the meatus, and a second safety sensor wire was  added and the old stent removed.  A single channel digital ureteroscope advanced easily over the wire up to the kidney under fluoroscopic vision and there were no stones seen within the ureter.  The previously seen proximal ureteral stone had been pushed into the lower pole, and 2 stones total were seen in the lower pole that measured about 8 mm each.  There is also a smaller 3 mm upper pole stone.  The 242 m laser fiber on settings of 0.5 J and 40 Hz was used to methodically dust the stones.  The stones were extremely hard, but care was taken to fragment these to <65mm fragments.  Thorough pyeloscopy showed no other stones or significant fragments.  Retrograde pyelogram was performed from the proximal ureter that showed no extravasation or filling defects  Careful pullback ureteroscopy showed no abnormalities.  The rigid cystoscope was backloaded over the wire and a 6 [place] by 26 cm ureteral stent was uneventfully placed with a curl in the left renal pelvis, as well as in the bladder and direct vision.  The bladder was drained and the Dangler was secured to the penis using Mastisol and Tegaderm.  A belladonna suppository was placed.  Disposition: Stable to PACU  Plan: Remove stent at home on Tuesday, nitrofurantoin prophylaxis Follow-up with urology as needed  Nickolas Madrid, MD

## 2020-10-16 NOTE — Discharge Instructions (Signed)

## 2020-10-19 ENCOUNTER — Encounter: Payer: Self-pay | Admitting: Urology

## 2020-10-19 DIAGNOSIS — E782 Mixed hyperlipidemia: Secondary | ICD-10-CM | POA: Diagnosis not present

## 2020-10-19 DIAGNOSIS — K219 Gastro-esophageal reflux disease without esophagitis: Secondary | ICD-10-CM | POA: Diagnosis not present

## 2020-10-19 DIAGNOSIS — R079 Chest pain, unspecified: Secondary | ICD-10-CM | POA: Diagnosis not present

## 2020-10-19 DIAGNOSIS — R9431 Abnormal electrocardiogram [ECG] [EKG]: Secondary | ICD-10-CM | POA: Diagnosis not present

## 2020-10-19 DIAGNOSIS — R0602 Shortness of breath: Secondary | ICD-10-CM | POA: Diagnosis not present

## 2020-10-19 DIAGNOSIS — J449 Chronic obstructive pulmonary disease, unspecified: Secondary | ICD-10-CM | POA: Diagnosis not present

## 2020-10-19 DIAGNOSIS — I4891 Unspecified atrial fibrillation: Secondary | ICD-10-CM | POA: Diagnosis not present

## 2020-10-19 DIAGNOSIS — I10 Essential (primary) hypertension: Secondary | ICD-10-CM | POA: Diagnosis not present

## 2020-10-19 NOTE — Anesthesia Postprocedure Evaluation (Signed)
Anesthesia Post Note  Patient: [name]  Procedure(s) Performed: CYSTOSCOPY/URETEROSCOPY/HOLMIUM LASER/STENT PLACEMENT (Left) CYSTOSCOPY WITH RETROGRADE PYELOGRAM  Patient location during evaluation: PACU Anesthesia Type: General Level of consciousness: awake and alert Pain management: pain level controlled Vital Signs Assessment: post-procedure vital signs reviewed and stable Respiratory status: spontaneous breathing, nonlabored ventilation, respiratory function stable and patient connected to nasal cannula oxygen Cardiovascular status: blood pressure returned to baseline and stable Postop Assessment: no apparent nausea or vomiting Anesthetic complications: no Comments: Spoke with cardiology, Dr. Chancy Milroy, regarding the patient.  Patient no longer having chest pain or other symptoms of MI and is hemodynamically stable.  Cardiology would like the patient to follow up with them in their clinic on 10/19/20   No notable events documented.   Last Vitals:  Vitals:   10/16/20 1725 10/16/20 1800  BP: 134/71 117/60  Pulse: 86 83  Resp: 18   Temp: (!) 36 C   SpO2: 98% 97%    Last Pain:  Vitals:   10/16/20 1800  TempSrc:   PainSc: 4                  Precious Haws Dacota Ruben

## 2020-10-20 ENCOUNTER — Emergency Department: Payer: Medicare HMO

## 2020-10-20 ENCOUNTER — Other Ambulatory Visit: Payer: Self-pay

## 2020-10-20 ENCOUNTER — Inpatient Hospital Stay
Admission: EM | Admit: 2020-10-20 | Discharge: 2020-10-31 | DRG: 871 | Disposition: A | Discharge status: Home / Self Care | Payer: Medicare HMO | Attending: Internal Medicine | Admitting: Internal Medicine

## 2020-10-20 DIAGNOSIS — Z79899 Other long term (current) drug therapy: Secondary | ICD-10-CM

## 2020-10-20 DIAGNOSIS — N200 Calculus of kidney: Secondary | ICD-10-CM | POA: Diagnosis not present

## 2020-10-20 DIAGNOSIS — Z888 Allergy status to other drugs, medicaments and biological substances status: Secondary | ICD-10-CM | POA: Diagnosis not present

## 2020-10-20 DIAGNOSIS — J9690 Respiratory failure, unspecified, unspecified whether with hypoxia or hypercapnia: Secondary | ICD-10-CM | POA: Diagnosis not present

## 2020-10-20 DIAGNOSIS — N138 Other obstructive and reflux uropathy: Secondary | ICD-10-CM

## 2020-10-20 DIAGNOSIS — A419 Sepsis, unspecified organism: Secondary | ICD-10-CM | POA: Diagnosis not present

## 2020-10-20 DIAGNOSIS — R6521 Severe sepsis with septic shock: Secondary | ICD-10-CM | POA: Diagnosis not present

## 2020-10-20 DIAGNOSIS — N390 Urinary tract infection, site not specified: Secondary | ICD-10-CM | POA: Diagnosis present

## 2020-10-20 DIAGNOSIS — Z9289 Personal history of other medical treatment: Secondary | ICD-10-CM

## 2020-10-20 DIAGNOSIS — R0602 Shortness of breath: Secondary | ICD-10-CM

## 2020-10-20 DIAGNOSIS — I517 Cardiomegaly: Secondary | ICD-10-CM | POA: Diagnosis not present

## 2020-10-20 DIAGNOSIS — D696 Thrombocytopenia, unspecified: Secondary | ICD-10-CM | POA: Diagnosis not present

## 2020-10-20 DIAGNOSIS — Z8673 Personal history of transient ischemic attack (TIA), and cerebral infarction without residual deficits: Secondary | ICD-10-CM

## 2020-10-20 DIAGNOSIS — E785 Hyperlipidemia, unspecified: Secondary | ICD-10-CM | POA: Diagnosis present

## 2020-10-20 DIAGNOSIS — K219 Gastro-esophageal reflux disease without esophagitis: Secondary | ICD-10-CM | POA: Diagnosis present

## 2020-10-20 DIAGNOSIS — I700 Atherosclerosis of aorta: Secondary | ICD-10-CM | POA: Diagnosis present

## 2020-10-20 DIAGNOSIS — I5031 Acute diastolic (congestive) heart failure: Secondary | ICD-10-CM | POA: Diagnosis present

## 2020-10-20 DIAGNOSIS — M160 Bilateral primary osteoarthritis of hip: Secondary | ICD-10-CM | POA: Diagnosis not present

## 2020-10-20 DIAGNOSIS — J9621 Acute and chronic respiratory failure with hypoxia: Secondary | ICD-10-CM | POA: Diagnosis not present

## 2020-10-20 DIAGNOSIS — Z85828 Personal history of other malignant neoplasm of skin: Secondary | ICD-10-CM

## 2020-10-20 DIAGNOSIS — J188 Other pneumonia, unspecified organism: Secondary | ICD-10-CM | POA: Diagnosis not present

## 2020-10-20 DIAGNOSIS — J189 Pneumonia, unspecified organism: Secondary | ICD-10-CM

## 2020-10-20 DIAGNOSIS — Z7901 Long term (current) use of anticoagulants: Secondary | ICD-10-CM

## 2020-10-20 DIAGNOSIS — I110 Hypertensive heart disease with heart failure: Secondary | ICD-10-CM | POA: Diagnosis not present

## 2020-10-20 DIAGNOSIS — Z7951 Long term (current) use of inhaled steroids: Secondary | ICD-10-CM

## 2020-10-20 DIAGNOSIS — I4891 Unspecified atrial fibrillation: Secondary | ICD-10-CM | POA: Diagnosis not present

## 2020-10-20 DIAGNOSIS — Z20822 Contact with and (suspected) exposure to covid-19: Secondary | ICD-10-CM | POA: Diagnosis not present

## 2020-10-20 DIAGNOSIS — I9589 Other hypotension: Secondary | ICD-10-CM

## 2020-10-20 DIAGNOSIS — Z8249 Family history of ischemic heart disease and other diseases of the circulatory system: Secondary | ICD-10-CM

## 2020-10-20 DIAGNOSIS — B952 Enterococcus as the cause of diseases classified elsewhere: Secondary | ICD-10-CM | POA: Diagnosis present

## 2020-10-20 DIAGNOSIS — J449 Chronic obstructive pulmonary disease, unspecified: Secondary | ICD-10-CM | POA: Diagnosis not present

## 2020-10-20 DIAGNOSIS — I25119 Atherosclerotic heart disease of native coronary artery with unspecified angina pectoris: Secondary | ICD-10-CM | POA: Diagnosis present

## 2020-10-20 DIAGNOSIS — B958 Unspecified staphylococcus as the cause of diseases classified elsewhere: Secondary | ICD-10-CM | POA: Diagnosis present

## 2020-10-20 DIAGNOSIS — Z8616 Personal history of COVID-19: Secondary | ICD-10-CM | POA: Diagnosis not present

## 2020-10-20 DIAGNOSIS — R8281 Pyuria: Secondary | ICD-10-CM | POA: Diagnosis not present

## 2020-10-20 DIAGNOSIS — J9601 Acute respiratory failure with hypoxia: Secondary | ICD-10-CM

## 2020-10-20 DIAGNOSIS — Z7982 Long term (current) use of aspirin: Secondary | ICD-10-CM

## 2020-10-20 DIAGNOSIS — Z466 Encounter for fitting and adjustment of urinary device: Secondary | ICD-10-CM | POA: Diagnosis not present

## 2020-10-20 DIAGNOSIS — Z720 Tobacco use: Secondary | ICD-10-CM

## 2020-10-20 DIAGNOSIS — R079 Chest pain, unspecified: Secondary | ICD-10-CM | POA: Diagnosis not present

## 2020-10-20 DIAGNOSIS — I480 Paroxysmal atrial fibrillation: Secondary | ICD-10-CM | POA: Diagnosis not present

## 2020-10-20 DIAGNOSIS — R3129 Other microscopic hematuria: Secondary | ICD-10-CM | POA: Diagnosis not present

## 2020-10-20 DIAGNOSIS — R6520 Severe sepsis without septic shock: Secondary | ICD-10-CM | POA: Diagnosis not present

## 2020-10-20 DIAGNOSIS — K5730 Diverticulosis of large intestine without perforation or abscess without bleeding: Secondary | ICD-10-CM | POA: Diagnosis not present

## 2020-10-20 DIAGNOSIS — J439 Emphysema, unspecified: Secondary | ICD-10-CM | POA: Diagnosis not present

## 2020-10-20 DIAGNOSIS — R0902 Hypoxemia: Secondary | ICD-10-CM | POA: Diagnosis not present

## 2020-10-20 DIAGNOSIS — I959 Hypotension, unspecified: Secondary | ICD-10-CM | POA: Diagnosis not present

## 2020-10-20 DIAGNOSIS — J811 Chronic pulmonary edema: Secondary | ICD-10-CM

## 2020-10-20 DIAGNOSIS — R9431 Abnormal electrocardiogram [ECG] [EKG]: Secondary | ICD-10-CM | POA: Diagnosis not present

## 2020-10-20 DIAGNOSIS — R55 Syncope and collapse: Secondary | ICD-10-CM | POA: Diagnosis not present

## 2020-10-20 DIAGNOSIS — R000 Tachycardia, unspecified: Secondary | ICD-10-CM | POA: Diagnosis not present

## 2020-10-20 DIAGNOSIS — R531 Weakness: Secondary | ICD-10-CM | POA: Diagnosis not present

## 2020-10-20 LAB — BRAIN NATRIURETIC PEPTIDE: B Natriuretic Peptide: 230.9 pg/mL — ABNORMAL HIGH (ref 0.0–100.0)

## 2020-10-20 LAB — HEPATIC FUNCTION PANEL
ALT: 23 U/L (ref 0–44)
AST: 22 U/L (ref 15–41)
Albumin: 2.9 g/dL — ABNORMAL LOW (ref 3.5–5.0)
Alkaline Phosphatase: 85 U/L (ref 38–126)
Bilirubin, Direct: 0.3 mg/dL — ABNORMAL HIGH (ref 0.0–0.2)
Indirect Bilirubin: 1 mg/dL — ABNORMAL HIGH (ref 0.3–0.9)
Total Bilirubin: 1.3 mg/dL — ABNORMAL HIGH (ref 0.3–1.2)
Total Protein: 5.9 g/dL — ABNORMAL LOW (ref 6.5–8.1)

## 2020-10-20 LAB — RESP PANEL BY RT-PCR (FLU A&B, COVID) ARPGX2
Influenza A by PCR: NEGATIVE
Influenza B by PCR: NEGATIVE
SARS Coronavirus 2 by RT PCR: NEGATIVE

## 2020-10-20 LAB — BASIC METABOLIC PANEL
Anion gap: 7 (ref 5–15)
BUN: 22 mg/dL (ref 8–23)
CO2: 30 mmol/L (ref 22–32)
Calcium: 8.1 mg/dL — ABNORMAL LOW (ref 8.9–10.3)
Chloride: 102 mmol/L (ref 98–111)
Creatinine, Ser: 1.13 mg/dL (ref 0.61–1.24)
GFR, Estimated: >60 mL/min (ref >60)
Glucose, Bld: 110 mg/dL — ABNORMAL HIGH (ref 70–99)
Potassium: 3.9 mmol/L (ref 3.5–5.1)
Sodium: 139 mmol/L (ref 135–145)

## 2020-10-20 LAB — CBC
HCT: 32.4 % — ABNORMAL LOW (ref 39.0–52.0)
Hemoglobin: 10.8 g/dL — ABNORMAL LOW (ref 13.0–17.0)
MCH: 31.5 pg (ref 26.0–34.0)
MCHC: 33.3 g/dL (ref 30.0–36.0)
MCV: 94.5 fL (ref 80.0–100.0)
Platelets: 147 10*3/uL — ABNORMAL LOW (ref 150–400)
RBC: 3.43 MIL/uL — ABNORMAL LOW (ref 4.22–5.81)
RDW: 13.3 % (ref 11.5–15.5)
WBC: 9.8 10*3/uL (ref 4.0–10.5)
nRBC: 0 % (ref 0.0–0.2)

## 2020-10-20 LAB — URINALYSIS, COMPLETE (UACMP) WITH MICROSCOPIC
Bacteria, UA: NONE SEEN
Bilirubin Urine: NEGATIVE
Glucose, UA: NEGATIVE mg/dL
Ketones, ur: NEGATIVE mg/dL
Nitrite: NEGATIVE
Protein, ur: 100 mg/dL — AB
RBC / HPF: >50 RBC/hpf — ABNORMAL HIGH (ref 0–5)
Specific Gravity, Urine: 1.016 (ref 1.005–1.030)
Squamous Epithelial / HPF: NONE SEEN (ref 0–5)
pH: 9 — ABNORMAL HIGH (ref 5.0–8.0)

## 2020-10-20 LAB — LACTIC ACID, PLASMA: Lactic Acid, Venous: 1 mmol/L (ref 0.5–1.9)

## 2020-10-20 LAB — PROTIME-INR
INR: 1.5 — ABNORMAL HIGH (ref 0.8–1.2)
Prothrombin Time: 17.9 seconds — ABNORMAL HIGH (ref 11.4–15.2)

## 2020-10-20 LAB — APTT: aPTT: 38 seconds — ABNORMAL HIGH (ref 24–36)

## 2020-10-20 MED ORDER — ATORVASTATIN CALCIUM 20 MG PO TABS
40.0000 mg | ORAL_TABLET | Freq: Every day | ORAL | Status: DC
  Administered 2020-10-21 – 2020-10-30 (×10): 40 mg via ORAL
  Filled 2020-10-20 (×10): qty 2

## 2020-10-20 MED ORDER — PHENAZOPYRIDINE HCL 200 MG PO TABS
200.0000 mg | ORAL_TABLET | Freq: Three times a day (TID) | ORAL | Status: DC | PRN
  Filled 2020-10-20: qty 1

## 2020-10-20 MED ORDER — SODIUM CHLORIDE 0.9 % IV SOLN
2.0000 g | Freq: Three times a day (TID) | INTRAVENOUS | Status: AC
  Administered 2020-10-21 – 2020-10-25 (×14): 2 g via INTRAVENOUS
  Filled 2020-10-20 (×17): qty 2

## 2020-10-20 MED ORDER — SODIUM CHLORIDE 0.9 % IV SOLN
500.0000 mg | Freq: Once | INTRAVENOUS | Status: AC
  Administered 2020-10-20: 500 mg via INTRAVENOUS
  Filled 2020-10-20: qty 500

## 2020-10-20 MED ORDER — GUAIFENESIN-DM 100-10 MG/5ML PO SYRP: 5.0000 mL | ORAL_SOLUTION | ORAL | Status: DC | PRN

## 2020-10-20 MED ORDER — SODIUM CHLORIDE 0.9 % IV SOLN: Freq: Once | INTRAVENOUS | Status: AC

## 2020-10-20 MED ORDER — SODIUM CHLORIDE 0.9 % IV BOLUS
500.0000 mL | Freq: Once | INTRAVENOUS | Status: AC
  Administered 2020-10-20: 500 mL via INTRAVENOUS

## 2020-10-20 MED ORDER — ASPIRIN EC 81 MG PO TBEC
81.0000 mg | DELAYED_RELEASE_TABLET | Freq: Every day | ORAL | Status: DC
  Administered 2020-10-21 – 2020-10-30 (×10): 81 mg via ORAL
  Filled 2020-10-20 (×10): qty 1

## 2020-10-20 MED ORDER — IPRATROPIUM-ALBUTEROL 0.5-2.5 (3) MG/3ML IN SOLN
3.0000 mL | Freq: Once | RESPIRATORY_TRACT | Status: AC
  Administered 2020-10-20: 3 mL via RESPIRATORY_TRACT
  Filled 2020-10-20: qty 6

## 2020-10-20 MED ORDER — ACETAMINOPHEN 325 MG PO TABS
650.0000 mg | ORAL_TABLET | Freq: Once | ORAL | Status: AC
  Administered 2020-10-20: 650 mg via ORAL
  Filled 2020-10-20: qty 2

## 2020-10-20 MED ORDER — LORATADINE 10 MG PO TABS
10.0000 mg | ORAL_TABLET | Freq: Every day | ORAL | Status: DC
Start: 2020-10-21 — End: 2020-10-31
  Administered 2020-10-21 – 2020-10-31 (×11): 10 mg via ORAL
  Filled 2020-10-20 (×11): qty 1

## 2020-10-20 MED ORDER — LACTATED RINGERS IV SOLN: INTRAVENOUS | Status: DC

## 2020-10-20 MED ORDER — ACETAMINOPHEN 500 MG PO TABS: 1000.0000 mg | ORAL_TABLET | Freq: Four times a day (QID) | ORAL | Status: DC | PRN

## 2020-10-20 MED ORDER — FERROUS GLUCONATE 324 (38 FE) MG PO TABS
324.0000 mg | ORAL_TABLET | Freq: Every day | ORAL | Status: DC
  Administered 2020-10-21 – 2020-10-30 (×10): 324 mg via ORAL
  Filled 2020-10-20 (×11): qty 1

## 2020-10-20 MED ORDER — SODIUM CHLORIDE 0.9 % IV SOLN
500.0000 mg | INTRAVENOUS | Status: DC
  Administered 2020-10-21 – 2020-10-22 (×2): 500 mg via INTRAVENOUS
  Filled 2020-10-20 (×2): qty 500

## 2020-10-20 MED ORDER — IPRATROPIUM-ALBUTEROL 0.5-2.5 (3) MG/3ML IN SOLN
3.0000 mL | Freq: Once | RESPIRATORY_TRACT | Status: AC
  Administered 2020-10-20: 3 mL via RESPIRATORY_TRACT

## 2020-10-20 MED ORDER — APIXABAN 5 MG PO TABS
5.0000 mg | ORAL_TABLET | Freq: Two times a day (BID) | ORAL | Status: DC
  Administered 2020-10-20 – 2020-10-31 (×22): 5 mg via ORAL
  Filled 2020-10-20 (×22): qty 1

## 2020-10-20 MED ORDER — PANTOPRAZOLE SODIUM 40 MG PO TBEC
40.0000 mg | DELAYED_RELEASE_TABLET | Freq: Every day | ORAL | Status: DC
  Administered 2020-10-21 – 2020-10-30 (×10): 40 mg via ORAL
  Filled 2020-10-20 (×10): qty 1

## 2020-10-20 MED ORDER — FAMOTIDINE 20 MG PO TABS
20.0000 mg | ORAL_TABLET | Freq: Every day | ORAL | Status: DC
  Administered 2020-10-21 – 2020-10-31 (×11): 20 mg via ORAL
  Filled 2020-10-20 (×11): qty 1

## 2020-10-20 MED ORDER — SODIUM CHLORIDE 0.9 % IV BOLUS
1000.0000 mL | Freq: Once | INTRAVENOUS | Status: AC
  Administered 2020-10-20: 1000 mL via INTRAVENOUS

## 2020-10-20 MED ORDER — MONTELUKAST SODIUM 10 MG PO TABS
10.0000 mg | ORAL_TABLET | Freq: Every day | ORAL | Status: DC
  Administered 2020-10-21 – 2020-10-30 (×10): 10 mg via ORAL
  Filled 2020-10-20 (×10): qty 1

## 2020-10-20 MED ORDER — TAMSULOSIN HCL 0.4 MG PO CAPS
0.4000 mg | ORAL_CAPSULE | Freq: Every day | ORAL | Status: DC
  Administered 2020-10-21 – 2020-10-31 (×11): 0.4 mg via ORAL
  Filled 2020-10-20 (×11): qty 1

## 2020-10-20 MED ORDER — ACETAMINOPHEN 325 MG PO TABS: 650.0000 mg | ORAL_TABLET | Freq: Four times a day (QID) | ORAL | Status: DC | PRN

## 2020-10-20 MED ORDER — MOMETASONE FURO-FORMOTEROL FUM 200-5 MCG/ACT IN AERO: 2.0000 | INHALATION_SPRAY | Freq: Two times a day (BID) | RESPIRATORY_TRACT | Status: DC

## 2020-10-20 MED ORDER — SODIUM CHLORIDE 0.9 % IV SOLN
2.0000 g | Freq: Once | INTRAVENOUS | Status: AC
  Administered 2020-10-20: 2 g via INTRAVENOUS
  Filled 2020-10-20: qty 2

## 2020-10-20 NOTE — ED Notes (Signed)
Pt encouraged to provide urine sample.

## 2020-10-20 NOTE — ED Notes (Signed)
Pt at bedside with son attempting to provide urine sample.

## 2020-10-20 NOTE — Progress Notes (Signed)
Pharmacy Antibiotic Note  [name] Browning is a 80 y.o. male admitted on 10/20/2020 with pneumonia.  Pharmacy has been consulted for Cefepime dosing.  Plan: Cefepime 2 gm IV X 1 given in ED on 10/11 @ 1824. Cefepime 2 gm IV Q8H ordered to continue on 10/12 @ 0200.   Height: 6\' 2"  (188 cm) Weight: 81.6 kg (180 lb) IBW/kg (Calculated) : 82.2  Temp (24hrs), Avg:100.4 F (38 C), Min:99.5 F (37.5 C), Max:101.1 F (38.4 C)  Recent Labs  Lab 10/20/20 1733 10/20/20 2020  WBC 9.8  --   CREATININE 1.13  --   LATICACIDVEN  --  1.0    Estimated Creatinine Clearance: 61.2 mL/min (by C-G formula based on SCr of 1.13 mg/dL).    Allergies  Allergen Reactions   Hydromorphone     Hallucination Pt reports he doesn't know about this    Antimicrobials this admission:   >>    >>   Dose adjustments this admission:   Microbiology results:  BCx:   UCx:    Sputum:    MRSA PCR:   Thank you for allowing pharmacy to be a part of this patient's care.  Tajana Crotteau D 10/20/2020 10:45 PM

## 2020-10-20 NOTE — ED Triage Notes (Signed)
Pt to ED from home for near syncope via [facility]  Pt intermittently weak, feeling as if would faint today pt has a fib and displayed a fib on EMS 12 lead EKG  Normally on 2L, currently on 4L (pt found to be high 80s SPO2 on 2L by EMS)  T 99.5, CBG 130, BP 117/63 other VS normal  Pt states has had cough since last night.  Found to be 85% on RA, placed on 2L oxygen per [place]  Pt states had been on 2L oxygen "since surgery" for kidney stone

## 2020-10-20 NOTE — ED Notes (Signed)
EDP was at bedside.

## 2020-10-20 NOTE — ED Notes (Signed)
MD Tu notified via secure chat of persistent hypotension despite previous fluid boluses. No change in patients mentation. Currently conversing with son at bedside.

## 2020-10-20 NOTE — H&P (Addendum)
History and Physical    [name] Browning ZTI:[id number] DOB: June 18, 1940 DOA: 10/20/2020  PCP: [place]  Patient coming from: Home  I have personally briefly reviewed patient's old medical records in [place]  Chief Complaint: Weakness and shortness of breath  HPI: [name] Browning is a 80 y.o. male with medical history significant for COPD with chronic hypoxemia on 2 L, paroxysmal atrial fibrillation on Eliquis, hypertension, TIA and recent nephrolithiasis s/p ureteral stent who presents with concerns of weakness and increasing shortness of breath.  Patient recently hospitalized in September for abdominal pain and shortness of breath.  He was found to have COPD with pneumonia.  He also had obstructing left proximal ureteral stone with left-sided hydronephrosis, AKI and underwent left ureteral stent by urology on 9/11.  Subsequently following procedure he developed interstitial edema and hypotension requiring ICU/SDM BiPAP.  Patient reports that since returning home from this admission he has not felt back to baseline.  Has continue progression of shortness of breath and could barely walk down the hall at home.  Has been feeling weak.  Has cough.  Daughter notes temperature of 103F day.  In the ED, he was febrile up to 101.1, hypotensive down to 80s over 50s, tachycardic heart rate of 109, tachypneic RR of 24 and was on nonrebreather with 4L at the time my evaluation.  CBC showed no leukocytosis, hemoglobin stable at 10.8, platelet 147, sodium 139, potassium 3.9, BG of 110. UA shows moderate leukocyte, negative nitrite but no bacteria.  This Chest x-ray with bilateral infiltrate edema versus pneumonia.  CT renal stone search shows bilateral mild pleural effusion and possible malpositioning of urethral stent.  He was started on cefepime and Rocephin.  Also given DuoNeb x2.  Review of Systems: Pertinent positives and negatives as above as patient was not a great  historian  Past Medical History:  Diagnosis Date   Anginal pain ([place])    Anxiety    Aortic atherosclerosis ([place])    Atrial fibrillation ([place]) 01/2019   Bilateral carpal tunnel syndrome    CAD (coronary artery disease)    a.) LHC 2004 --> normal coronaries. b.) normal stress test in 2007 and 2011; c.) Lexiscan 05/20/2014 --> LVEF 55-65%; no significant stress induced ischemia/arrythmia. d.) CT chest 03/25/2019 --> coronaries carcified.   Carotid atherosclerosis, bilateral    Carpal tunnel syndrome, bilateral    Chronic anticoagulation    a.) ASA + apixaban   COPD (chronic obstructive pulmonary disease) (HCC)    CVA (cerebral vascular accident) ([place])    Degenerative disc disease, cervical    Diastolic dysfunction    a.) TTE 05/29/2014 --> LVEF 60-65%; G1DD.   DVT (deep venous thrombosis) (HCC)    GERD (gastroesophageal reflux disease)    History of 2019 novel coronavirus disease (COVID-19) 02/08/2019   HLD (hyperlipidemia)    Hypertension    Kidney stones    Osteoarthritis of right shoulder    Pneumonia    Respiratory failure, acute ([place]) 09/20/2020   a.) severe respiratory distress 1 hour after urological surgery. CXR (+) for acute pulmonary edema. Transferred to ICU and placed on NIPPV. Questionable aspiration PNA. (+) A.fib with RVR. Improved with ABX, diuresis, and amiodarone.   Skin cancer of face    a.) RIGHT ear and RIGHT forehead; excised.   Syncope    TIA (transient ischemic attack) 2016   Valvular regurgitation    a.) TTE 05/26/2014 --> LVEF 60-65%; trivial MR, mild TR; no AR or PR. b.)  TTE 04/20/2015 --> LVEF 55-60%; trivial MR and PR; no AR or TR.    Past Surgical History:  Procedure Laterality Date   CARDIAC CATHETERIZATION  2004   CARPAL TUNNEL RELEASE Right 06/11/2013   CYSTOSCOPY W/ RETROGRADES  10/16/2020   Procedure: CYSTOSCOPY WITH RETROGRADE PYELOGRAM;  Surgeon: Billey Co, MD;  Location: [facility] ORS;  Service: Urology;;   CYSTOSCOPY W/ URETERAL STENT  PLACEMENT Left 09/20/2020   Procedure: CYSTOSCOPY WITH RETROGRADE PYELOGRAM/URETERAL STENT PLACEMENT;  Surgeon: Janith Lima, MD;  Location: [facility] OR[facility];  Service: Urology;  Laterality: Left;   CYSTOSCOPY/URETEROSCOPY/HOLMIUM LASER/STENT PLACEMENT Left 10/16/2020   Procedure: CYSTOSCOPY/URETEROSCOPY/HOLMIUM LASER/STENT PLACEMENT;  Surgeon: Billey Co, MD;  Location: [facility] OR[facility];  Service: Urology;  Laterality: Left;   SHOULDER ARTHROSCOPY WITH OPEN ROTATOR CUFF REPAIR Right 08/24/2017   Procedure: SHOULDER ARTHROSCOPY WITH OPEN ROTATOR CUFF REPAIR;  Surgeon: Corky Mull, MD;  Location: [facility] OR[facility];  Service: Orthopedics;  Laterality: Right;   SKIN CANCER EXCISION  12/01/2016   right ear    SKIN CANCER EXCISION     remove from the right side of the face    THROMBECTOMY Right 2004   leg   THYROIDECTOMY  1950   Not sure if total or partial thyroidectomy.     reports that he quit smoking about 17 years ago. His smoking use included cigarettes. He started smoking about 63 years ago. He has a 46.00 pack-year smoking history. He quit smokeless tobacco use about 17 years ago.  His smokeless tobacco use included snuff. He reports that he does not currently use alcohol after a past usage of about 7.0 standard drinks per week. He reports that he does not use drugs. Social History  Allergies  Allergen Reactions   Hydromorphone     Hallucination Pt reports he doesn't know about this    Family History  Problem Relation Age of Onset   Hypertension Mother    Heart disease Mother    CAD Father    Heart attack Father      Prior to Admission medications   Medication Sig Start Date End Date Taking? Authorizing Provider  acetaminophen (TYLENOL) 500 MG tablet Take 2 tablets (1,000 mg total) by mouth every 6 (six) hours as needed for mild pain. 05/07/20  Yes Piscoya, Jacqulyn Bath, MD  amLODipine (NORVASC) 5 MG tablet Take 1 tablet (5 mg total) by mouth daily after supper. 09/28/20  Yes Pokhrel, Laxman, MD   apixaban (ELIQUIS) 5 MG TABS tablet Take 1 tablet (5 mg total) by mouth 2 (two) times daily. 09/28/20 10/28/20 Yes Pokhrel, Laxman, MD  aspirin EC 81 MG tablet Take 81 mg by mouth daily after supper.   Yes [provider]  atorvastatin (LIPITOR) 40 MG tablet Take 40 mg by mouth daily after supper.   Yes [provider]  cetirizine (ZYRTEC) 10 MG tablet Take 10 mg by mouth daily. 09/29/20  Yes [provider]  famotidine (PEPCID) 20 MG tablet One after supper 10/06/20  Yes Tanda Rockers, MD  Ferrous Gluconate (IRON) 240 (27 Fe) MG TABS Take 27 mg by mouth daily after supper. 09/21/18  Yes [provider]  furosemide (LASIX) 20 MG tablet Take 1 tablet (20 mg total) by mouth daily after breakfast. 09/28/20 09/28/21 Yes Pokhrel, Laxman, MD  guaiFENesin-dextromethorphan (ROBITUSSIN DM) 100-10 MG/5ML syrup Take 5 mLs by mouth every 4 (four) hours as needed for cough. 09/28/20  Yes Pokhrel, Laxman, MD  isosorbide mononitrate (IMDUR) 30 MG 24 hr tablet Take  30 mg by mouth daily. 10/19/20  Yes [provider]  losartan (COZAAR) 100 MG tablet Take 100 mg by mouth daily after supper.   Yes [provider]  montelukast (SINGULAIR) 10 MG tablet Take 10 mg by mouth at bedtime.   Yes [provider]  nitrofurantoin (MACRODANTIN) 50 MG capsule Take 1 capsule (50 mg total) by mouth daily. 10/16/20  Yes Billey Co, MD  pantoprazole (PROTONIX) 40 MG tablet Take 40 mg by mouth daily after supper. 12/10/18  Yes [provider]  phenazopyridine (PYRIDIUM) 200 MG tablet Take 1 tablet (200 mg total) by mouth 3 (three) times daily as needed (bladder pain). 09/28/20  Yes Pokhrel, Laxman, MD  polyethylene glycol (MIRALAX / GLYCOLAX) 17 g packet Take 17 g by mouth daily as needed for mild constipation or moderate constipation. Patient taking differently: Take 17 g by mouth daily. 09/28/20  Yes Pokhrel, Corrie Mckusick, MD  predniSONE (DELTASONE) 10 MG tablet Take  4  each am x 2 days,   2 each am x 2 days,  1 each am x 2 days and stop 10/06/20  Yes Tanda Rockers, MD  SYMBICORT 160-4.5 MCG/ACT inhaler Inhale 2 puffs into the lungs 2 (two) times daily as needed (shortness of breath). 12/07/18  Yes [provider]  tamsulosin (FLOMAX) 0.4 MG CAPS capsule Take 1 capsule (0.4 mg total) by mouth daily. 10/08/20  Yes Billey Co, MD    Physical Exam: Vitals:   10/20/20 2000 10/20/20 2030 10/20/20 2105 10/20/20 2150  BP: (!) 94/55 99/63 (!) 103/53 (!) 90/50  Pulse: 95 89 98 87  Resp: (!) 21 (!) 24 (!) 24 13  Temp:      TempSrc:      SpO2: 98% 92% 96% 100%  Weight:      Height:        Constitutional: NAD, thin elderly nontoxic-appearing male laying at 20 degree incline in bed Vitals:   10/20/20 2000 10/20/20 2030 10/20/20 2105 10/20/20 2150  BP: (!) 94/55 99/63 (!) 103/53 (!) 90/50  Pulse: 95 89 98 87  Resp: (!) 21 (!) 24 (!) 24 13  Temp:      TempSrc:      SpO2: 98% 92% 96% 100%  Weight:      Height:       Eyes: PERRL, lids and conjunctivae normal ENMT: Mucous membranes are moist. Posterior pharynx clear of any exudate or lesions.Normal dentition.  Neck: normal, supple, no masses, no thyromegaly Respiratory: clear to auscultation bilaterally, no wheezing, no crackles.  On nasal cannula with overlying nonrebreather mask with mild labored respiration when speaking  cardiovascular: Regular rate and rhythm, no murmurs / rubs / gallops. No extremity edema.   Abdomen: no tenderness, no masses palpated.  Bowel sounds positive.  Musculoskeletal: no clubbing / cyanosis. No joint deformity upper and lower extremities. Good ROM, no contractures. Normal muscle tone.  Skin: no rashes, lesions, ulcers. No induration Neurologic: CN 2-12 grossly intact. Sensation intact, Strength 5/5 in all 4.  Psychiatric: Normal judgment and insight. Alert and oriented x 3. Normal mood.     Labs on Admission: I have personally reviewed following labs and  imaging studies  CBC: Recent Labs  Lab 10/20/20 1733  WBC 9.8  HGB 10.8*  HCT 32.4*  MCV 94.5  PLT 237*   Basic Metabolic Panel: Recent Labs  Lab 10/20/20 1733  NA 139  K 3.9  CL 102  CO2 30  GLUCOSE 110*  BUN 22  CREATININE 1.13  CALCIUM 8.1*   GFR: Estimated Creatinine Clearance: 61.2 mL/min (by C-G formula based on SCr of 1.13 mg/dL). Liver Function Tests: Recent Labs  Lab 10/20/20 1733  AST 22  ALT 23  ALKPHOS 85  BILITOT 1.3*  PROT 5.9*  ALBUMIN 2.9*   No results for input(s): LIPASE, AMYLASE in the last 168 hours. No results for input(s): AMMONIA in the last 168 hours. Coagulation Profile: Recent Labs  Lab 10/20/20 2020  INR 1.5*   Cardiac Enzymes: No results for input(s): CKTOTAL, CKMB, CKMBINDEX, TROPONINI in the last 168 hours. BNP (last 3 results) No results for input(s): PROBNP in the last 8760 hours. HbA1C: No results for input(s): HGBA1C in the last 72 hours. CBG: No results for input(s): GLUCAP in the last 168 hours. Lipid Profile: No results for input(s): CHOL, HDL, LDLCALC, TRIG, CHOLHDL, LDLDIRECT in the last 72 hours. Thyroid Function Tests: No results for input(s): TSH, T4TOTAL, FREET4, T3FREE, THYROIDAB in the last 72 hours. Anemia Panel: No results for input(s): VITAMINB12, FOLATE, FERRITIN, TIBC, IRON, RETICCTPCT in the last 72 hours. Urine analysis:    Component Value Date/Time   COLORURINE YELLOW (A) 10/20/2020 1733   APPEARANCEUR CLOUDY (A) 10/20/2020 1733   APPEARANCEUR Cloudy (A) 10/08/2020 1545   LABSPEC 1.016 10/20/2020 1733   LABSPEC 1.014 08/10/2013 1825   PHURINE 9.0 (H) 10/20/2020 1733   GLUCOSEU NEGATIVE 10/20/2020 1733   GLUCOSEU Negative 08/10/2013 1825   HGBUR LARGE (A) 10/20/2020 1733   BILIRUBINUR NEGATIVE 10/20/2020 1733   BILIRUBINUR Negative 10/08/2020 1545   BILIRUBINUR 1+ 08/10/2013 1825   KETONESUR NEGATIVE 10/20/2020 1733   PROTEINUR 100 (A) 10/20/2020 1733   NITRITE NEGATIVE 10/20/2020 1733    LEUKOCYTESUR MODERATE (A) 10/20/2020 1733   LEUKOCYTESUR Negative 08/10/2013 1825    Radiological Exams on Admission: DG Abdomen 1 View  Result Date: 10/20/2020 CLINICAL DATA:  Sepsis.  Evaluate for stent placement. EXAM: ABDOMEN - 1 VIEW COMPARISON:  Fluoroscopy 10/16/2020 FINDINGS: A left ureteral stent is present. The proximal pigtail projects to the left of L3. This is lower than on the previous fluoroscopic image. The distal pigtail projects over the perineum below the level of the symphysis pubis. This suggests inferior migration of the stent with the distal pigtail probably either in the urethra or possibly in a prolapsed bladder. Scattered gas and stool throughout the colon and small bowel. No small or large bowel distention. No radiopaque stones are identified. Degenerative changes in the spine and hips. IMPRESSION: Left ureteral stent is present. The stent appears to have migrated inferiorly with the proximal pigtail lower than on prior study and the distal pigtail projecting below the level of the symphysis pubis, possibly in the urethra or in a prolapsed bladder. Electronically Signed   By: Lucienne Capers M.D.   On: 10/20/2020 20:18   DG Chest Port 1 View  Result Date: 10/20/2020 CLINICAL DATA:  Questionable sepsis.  Evaluate for abnormality. EXAM: PORTABLE CHEST 1 VIEW COMPARISON:  10/06/2020 FINDINGS: Mild cardiac enlargement. Diffuse airspace and interstitial infiltration throughout both lungs, progressing since prior study. This may represent edema or multifocal pneumonia. No pleural effusions. No pneumothorax. Mediastinal contours appear intact. Calcification of the aorta. Degenerative changes in the spine and shoulders. IMPRESSION: Increasing bilateral pulmonary infiltrates, likely edema or pneumonia. Electronically Signed   By: Lucienne Capers M.D.   On: 10/20/2020 19:08   CT Renal Stone Study  Result Date: 10/20/2020 CLINICAL DATA:  Intermittent weakness, feeling as if he  would faint.  EXAM: CT ABDOMEN AND PELVIS WITHOUT CONTRAST TECHNIQUE: Multidetector CT imaging of the abdomen and pelvis was performed following the standard protocol without IV contrast. COMPARISON:  September 18, 2020 FINDINGS: Lower chest: Moderate severity chronic appearing fibrotic changes are seen involving the bilateral lung bases. Small bilateral pleural effusions are noted. Hepatobiliary: No focal liver abnormality is seen. No gallstones, gallbladder wall thickening, or biliary dilatation. Pancreas: Unremarkable. No pancreatic ductal dilatation or surrounding inflammatory changes. Spleen: A punctate calcified granuloma is seen within an otherwise normal-appearing spleen. Adrenals/Urinary Tract: Adrenal glands are unremarkable. Kidneys are normal in size, without focal lesions. A 2 mm nonobstructing renal stone is seen within the upper pole of the right kidney. Clusters of subcentimeter nonobstructing renal stones are seen within the mid and lower left kidney. A left-sided endo ureteral stent is in place. The proximal portion of the stent is seen within the proximal left ureter, just beyond the left UPJ. The distal end extends into the urinary bladder, continues through the prostate gland in serpentine fashion to the proximal to mid portion of the urethra (axial CT image 97, CT series 2). The urinary bladder is empty and subsequently limited in evaluation. Stomach/Bowel: Stomach is within normal limits. Appendix appears normal. No evidence of bowel dilatation. Numerous diverticula are seen within the descending and sigmoid colon. Vascular/Lymphatic: Aortic atherosclerosis. No enlarged abdominal or pelvic lymph nodes. Reproductive: The prostate gland is moderately enlarged. A moderate amount of prostate gland calcification is seen. Other: A 4.4 cm x 2.7 cm fat containing right inguinal hernia is seen. No abdominopelvic ascites. Musculoskeletal: Multilevel degenerative changes seen throughout the lumbar spine.  IMPRESSION: 1. Left-sided endo ureteral stent which may be malpositioned, as described above. Confirmation of the desired positioning is recommended. 2. Bilateral nonobstructing renal stones. 3. Small bilateral pleural effusions. 4. Colonic diverticulosis. Aortic Atherosclerosis (ICD10-I70.0). Electronically Signed   By: Virgina Norfolk M.D.   On: 10/20/2020 21:54      Assessment/Plan  Acute on chronic hypoxemic respiratory failure secondary to community-acquired pneumonia - Patient baseline on 2 L.  He was discharged on this after COPD exacerbation in September.  Now presenting with tachypnea with respiratory rate of 24, hypoxic with ambulation and exertion requiring up to 4 L  - Previously treated for pneumonia in September as well with IV Rocephin and azithromycin.  Continue IV cefepime and azithromycin started in the ED  Sepsis secondary to community-acquired pneumonia - Patient presented with fever of 101.1, tachycardia heart rate of 109, tachypnea RR of 24 with findings on chest x-ray -Continue IV antibiotics as above - Continue IV fluid hydration  Septic shock  - Despite aggressive IV fluid pt continues to be hypotensive.  Will start Levophed , and consult with critical care NP Rufina Falco to transfer to ICU - Hold home amlodipine, losartan, Imdur, Lasix  Recent obstructing left ureteral stone with left hydronephrosis - Creatinine currently stable - Patient had left ureteral stent placed on 9/11 by Dr. Abner Greenspan - 10/7-underwent ureteroscopy, lithotripsy  -CT renal stone shows possible distal migration of the ureteral stent.  Will need to discuss with urology in the morning but doubt any GU infection needing urgent intervention since he has negative UA, no GI symptoms and benign abdominal exam. -continue Tamsulosin  Paroxysmal atrial fibrillation - Continue Eliquis  History of TIA - Continue aspirin  Hyperlipidemia -continues statin  DVT prophylaxis:.Eliquis  code Status:  Full Family Communication: Plan discussed with patient and son and daughter at bedside  disposition Plan: Home with at  least 2 midnight stays  Consults called:  Admission status: inpatient  Level of care: Progressive Cardiac  Status is: Inpatient  Remains inpatient appropriate because:Hemodynamically unstable  Dispo: The patient is from: Home              Anticipated d/c is to: Home              Patient currently is not medically stable to d/c.   Difficult to place patient No    Critical care   Performed by: Ileene Musa, DO Advised by: Ileene Musa, DO   Critical care provider statement Critical care time: 45 mins   Critical care was necessary to treat or prevent imminent or life-threatening deterioration of the following conditions: Septic shock from pneumonia.    Critical care time was spent personally by me on the following activities: Discussion with consultants, evaluation of patient's response to treatment, re-evaluation of patient's response to treatment, examination of patient, ordering and performing treatments and interventions, pulse oximetry, reevaluation of patient's condition, obtaining history from patient and review of old charts.  I     Orene Desanctis DO [facility]   If 7PM-7AM, please contact night-coverage [email or web address]   10/20/2020, 10:26 PM

## 2020-10-20 NOTE — ED Provider Notes (Signed)
[facility] Emergency Department Provider Note    Event Date/Time   First MD Initiated Contact with Patient 10/20/20 1750     (approximate)  I have reviewed the triage vital signs and the nursing notes.   HISTORY  Chief Complaint Near Syncope    HPI [name] Browning is a 80 y.o. male past medical history presents to the ER for evaluation of generalized malaise as well as feel like he was about to pass out.  Has been feeling unwell for few days.  No measured fevers but has been feeling cold at home.  Was recently admitted to the hospital.  Has had some dysuria.  Was also complaints of shortness of breath and cough.  Past Medical History:  Diagnosis Date   Anginal pain ([place])    Anxiety    Aortic atherosclerosis ([place])    Atrial fibrillation ([place]) 01/2019   Bilateral carpal tunnel syndrome    CAD (coronary artery disease)    a.) LHC 2004 --> normal coronaries. b.) normal stress test in 2007 and 2011; c.) Lexiscan 05/20/2014 --> LVEF 55-65%; no significant stress induced ischemia/arrythmia. d.) CT chest 03/25/2019 --> coronaries carcified.   Carotid atherosclerosis, bilateral    Carpal tunnel syndrome, bilateral    Chronic anticoagulation    a.) ASA + apixaban   COPD (chronic obstructive pulmonary disease) (HCC)    CVA (cerebral vascular accident) ([place])    Degenerative disc disease, cervical    Diastolic dysfunction    a.) TTE 05/29/2014 --> LVEF 60-65%; G1DD.   DVT (deep venous thrombosis) (HCC)    GERD (gastroesophageal reflux disease)    History of 2019 novel coronavirus disease (COVID-19) 02/08/2019   HLD (hyperlipidemia)    Hypertension    Kidney stones    Osteoarthritis of right shoulder    Pneumonia    Respiratory failure, acute ([place]) 09/20/2020   a.) severe respiratory distress 1 hour after urological surgery. CXR (+) for acute pulmonary edema. Transferred to ICU and placed on NIPPV. Questionable aspiration PNA. (+) A.fib with RVR. Improved with  ABX, diuresis, and amiodarone.   Skin cancer of face    a.) RIGHT ear and RIGHT forehead; excised.   Syncope    TIA (transient ischemic attack) 2016   Valvular regurgitation    a.) TTE 05/26/2014 --> LVEF 60-65%; trivial MR, mild TR; no AR or PR. b.) TTE 04/20/2015 --> LVEF 55-60%; trivial MR and PR; no AR or TR.   Family History  Problem Relation Age of Onset   Hypertension Mother    Heart disease Mother    CAD Father    Heart attack Father    Past Surgical History:  Procedure Laterality Date   CARDIAC CATHETERIZATION  2004   CARPAL TUNNEL RELEASE Right 06/11/2013   CYSTOSCOPY W/ RETROGRADES  10/16/2020   Procedure: CYSTOSCOPY WITH RETROGRADE PYELOGRAM;  Surgeon: Billey Co, MD;  Location: [facility] ORS;  Service: Urology;;   CYSTOSCOPY W/ URETERAL STENT PLACEMENT Left 09/20/2020   Procedure: CYSTOSCOPY WITH RETROGRADE PYELOGRAM/URETERAL STENT PLACEMENT;  Surgeon: Janith Lima, MD;  Location: [facility] ORS;  Service: Urology;  Laterality: Left;   CYSTOSCOPY/URETEROSCOPY/HOLMIUM LASER/STENT PLACEMENT Left 10/16/2020   Procedure: CYSTOSCOPY/URETEROSCOPY/HOLMIUM LASER/STENT PLACEMENT;  Surgeon: Billey Co, MD;  Location: [facility] ORS;  Service: Urology;  Laterality: Left;   SHOULDER ARTHROSCOPY WITH OPEN ROTATOR CUFF REPAIR Right 08/24/2017   Procedure: SHOULDER ARTHROSCOPY WITH OPEN ROTATOR CUFF REPAIR;  Surgeon: Corky Mull, MD;  Location: [facility] ORS;  Service: Orthopedics;  Laterality: Right;   SKIN CANCER EXCISION  12/01/2016   right ear    SKIN CANCER EXCISION     remove from the right side of the face    THROMBECTOMY Right 2004   leg   THYROIDECTOMY  1950   Not sure if total or partial thyroidectomy.   Patient Active Problem List   Diagnosis Date Noted   Acute on chronic respiratory failure with hypoxia ([place]) 10/20/2020   DOE (dyspnea on exertion) 10/06/2020   Chronic respiratory failure with hypoxia ([place]) 10/06/2020   Malnutrition of moderate degree 09/21/2020   Urinary  tract obstruction by kidney stone 09/19/2020   AKI (acute kidney injury) ([place]) 09/19/2020   COPD exacerbation ([place]) 09/18/2020   Acute respiratory failure with hypoxia ([place]) 09/18/2020   AF (paroxysmal atrial fibrillation) ([place]) 38/93/7342   Umbilical hernia without obstruction and without gangrene    Pneumonia 03/25/2019   History of 2019 novel coronavirus disease (COVID-19) 03/06/2019   COVID-19 virus infection 02/08/2019   New onset atrial fibrillation ([place]) 02/08/2019   COPD, moderate ([place]) 09/14/2018   Degenerative tear of glenoid labrum of right shoulder 08/24/2017   Injury of tendon of long head of right biceps 08/21/2017   Rotator cuff tendinitis, right 08/21/2017   Traumatic complete tear of right rotator cuff 08/21/2017   Chronic neck pain 03/09/2017   Degenerative disc disease, cervical 03/09/2017   Perennial allergic rhinitis 03/09/2017   Hyperglycemia 09/01/2015   Elevated rheumatoid factor 04/08/2015   Arthritis of both hands 04/01/2015   Bilateral hand pain 03/18/2015   Elevated alkaline phosphatase level 03/02/2015   Anxiety 02/16/2015   Intermittent chest pain 02/16/2015   History of TIA (transient ischemic attack) 05/18/2014   HTN (hypertension) 05/18/2014   Hyperlipidemia 05/18/2014   Status post carpal tunnel release 06/26/2013      Prior to Admission medications   Medication Sig Start Date End Date Taking? Authorizing Provider  acetaminophen (TYLENOL) 500 MG tablet Take 2 tablets (1,000 mg total) by mouth every 6 (six) hours as needed for mild pain. 05/07/20  Yes Piscoya, Jacqulyn Bath, MD  amLODipine (NORVASC) 5 MG tablet Take 1 tablet (5 mg total) by mouth daily after supper. 09/28/20  Yes Pokhrel, [place], MD  apixaban (ELIQUIS) 5 MG TABS tablet Take 1 tablet (5 mg total) by mouth 2 (two) times daily. 09/28/20 10/28/20 Yes Pokhrel, [place], MD  aspirin EC 81 MG tablet Take 81 mg by mouth daily after supper.   Yes [provider]  atorvastatin (LIPITOR) 40 MG  tablet Take 40 mg by mouth daily after supper.   Yes [provider]  cetirizine (ZYRTEC) 10 MG tablet Take 10 mg by mouth daily. 09/29/20  Yes [provider]  famotidine (PEPCID) 20 MG tablet One after supper 10/06/20  Yes Tanda Rockers, MD  Ferrous Gluconate (IRON) 240 (27 Fe) MG TABS Take 27 mg by mouth daily after supper. 09/21/18  Yes [provider]  furosemide (LASIX) 20 MG tablet Take 1 tablet (20 mg total) by mouth daily after breakfast. 09/28/20 09/28/21 Yes Pokhrel, [place]man, MD  guaiFENesin-dextromethorphan (ROBITUSSIN DM) 100-10 MG/5ML syrup Take 5 mLs by mouth every 4 (four) hours as needed for cough. 09/28/20  Yes Pokhrel, Laxman, MD  isosorbide mononitrate (IMDUR) 30 MG 24 hr tablet Take 30 mg by mouth daily. 10/19/20  Yes [provider]  losartan (COZAAR) 100 MG tablet Take 100 mg by mouth daily after supper.   Yes [provider]  montelukast (SINGULAIR) 10 MG tablet  Take 10 mg by mouth at bedtime.   Yes [provider]  nitrofurantoin (MACRODANTIN) 50 MG capsule Take 1 capsule (50 mg total) by mouth daily. 10/16/20  Yes Billey Co, MD  pantoprazole (PROTONIX) 40 MG tablet Take 40 mg by mouth daily after supper. 12/10/18  Yes [provider]  phenazopyridine (PYRIDIUM) 200 MG tablet Take 1 tablet (200 mg total) by mouth 3 (three) times daily as needed (bladder pain). 09/28/20  Yes Pokhrel, Laxman, MD  polyethylene glycol (MIRALAX / GLYCOLAX) 17 g packet Take 17 g by mouth daily as needed for mild constipation or moderate constipation. Patient taking differently: Take 17 g by mouth daily. 09/28/20  Yes Pokhrel, Corrie Mckusick, MD  predniSONE (DELTASONE) 10 MG tablet Take  4 each am x 2 days,   2 each am x 2 days,  1 each am x 2 days and stop 10/06/20  Yes Tanda Rockers, MD  SYMBICORT 160-4.5 MCG/ACT inhaler Inhale 2 puffs into the lungs 2 (two) times daily as needed (shortness of breath). 12/07/18  Yes [provider]   tamsulosin (FLOMAX) 0.4 MG CAPS capsule Take 1 capsule (0.4 mg total) by mouth daily. 10/08/20  Yes Billey Co, MD    Allergies Hydromorphone    Social History Social History   Tobacco Use   Smoking status: Former    Packs/day: 1.00    Years: 46.00    Pack years: 46.00    Types: Cigarettes    Start date: 01/10/1957    Quit date: 02/11/2003    Years since quitting: 17.7   Smokeless tobacco: Former    Types: Snuff    Quit date: 02/11/2003  Vaping Use   Vaping Use: Never used  Substance Use Topics   Alcohol use: Not Currently    Alcohol/week: 7.0 standard drinks    Types: 7 Cans of beer per week   Drug use: No    Review of Systems Patient denies headaches, rhinorrhea, blurry vision, numbness, shortness of breath, chest pain, edema, cough, abdominal pain, nausea, vomiting, diarrhea, dysuria, fevers, rashes or hallucinations unless otherwise stated above in HPI. ____________________________________________   PHYSICAL EXAM:  VITAL SIGNS: Vitals:   10/20/20 2105 10/20/20 2150  BP: (!) 103/53 (!) 90/50  Pulse: 98 87  Resp: (!) 24 13  Temp:    SpO2: 96% 100%    Constitutional: Alert and oriented.  Eyes: Conjunctivae are normal.  Head: Atraumatic. Nose: No congestion/rhinnorhea. Mouth/Throat: Mucous membranes are moist.   Neck: No stridor. Painless ROM.  Cardiovascular: mildly tachycardic, regular rhythm. Grossly normal heart sounds.  Good peripheral circulation. Respiratory: Normal respiratory effort.  No retractions. Lungs with coarse bs throughout Gastrointestinal: Soft and nontender. No distention. No abdominal bruits. No CVA tenderness. Genitourinary:  Musculoskeletal: No lower extremity tenderness nor edema.  No joint effusions. Neurologic:  Normal speech and language. No gross focal neurologic deficits are appreciated. No facial droop Skin:  Skin is warm, dry and intact. No rash noted. Psychiatric: Mood and affect are normal. Speech and behavior are  normal.  ____________________________________________   LABS (all labs ordered are listed, but only abnormal results are displayed)  Results for orders placed or performed during the hospital encounter of 10/20/20 (from the past 24 hour(s))  Basic metabolic panel     Status: Abnormal   Collection Time: 10/20/20  5:33 PM  Result Value Ref Range   Sodium 139 135 - 145 mmol/L   Potassium 3.9 3.5 - 5.1 mmol/L   Chloride 102 98 -  111 mmol/L   CO2 30 22 - 32 mmol/L   Glucose, Bld 110 (H) 70 - 99 mg/dL   BUN 22 8 - 23 mg/dL   Creatinine, Ser 1.13 0.61 - 1.24 mg/dL   Calcium 8.1 (L) 8.9 - 10.3 mg/dL   GFR, Estimated >60 >60 mL/min   Anion gap 7 5 - 15  CBC     Status: Abnormal   Collection Time: 10/20/20  5:33 PM  Result Value Ref Range   WBC 9.8 4.0 - 10.5 K/uL   RBC 3.43 (L) 4.22 - 5.81 MIL/uL   Hemoglobin 10.8 (L) 13.0 - 17.0 g/dL   HCT 32.4 (L) 39.0 - 52.0 %   MCV 94.5 80.0 - 100.0 fL   MCH 31.5 26.0 - 34.0 pg   MCHC 33.3 30.0 - 36.0 g/dL   RDW 13.3 11.5 - 15.5 %   Platelets 147 (L) 150 - 400 K/uL   nRBC 0.0 0.0 - 0.2 %  Urinalysis, Complete w Microscopic Urine, Clean Catch     Status: Abnormal   Collection Time: 10/20/20  5:33 PM  Result Value Ref Range   Color, Urine YELLOW (A) YELLOW   APPearance CLOUDY (A) CLEAR   Specific Gravity, Urine 1.016 1.005 - 1.030   pH 9.0 (H) 5.0 - 8.0   Glucose, UA NEGATIVE NEGATIVE mg/dL   Hgb urine dipstick LARGE (A) NEGATIVE   Bilirubin Urine NEGATIVE NEGATIVE   Ketones, ur NEGATIVE NEGATIVE mg/dL   Protein, ur 100 (A) NEGATIVE mg/dL   Nitrite NEGATIVE NEGATIVE   Leukocytes,Ua MODERATE (A) NEGATIVE   RBC / HPF >50 (H) 0 - 5 RBC/hpf   WBC, UA 21-50 0 - 5 WBC/hpf   Bacteria, UA NONE SEEN NONE SEEN   Squamous Epithelial / LPF NONE SEEN 0 - 5   Mucus PRESENT   Resp Panel by RT-PCR (Flu A&B, Covid) Nasopharyngeal Swab     Status: None   Collection Time: 10/20/20  5:33 PM   Specimen: Nasopharyngeal Swab; Nasopharyngeal(NP) swabs in vial  transport medium  Result Value Ref Range   SARS Coronavirus 2 by RT PCR NEGATIVE NEGATIVE   Influenza A by PCR NEGATIVE NEGATIVE   Influenza B by PCR NEGATIVE NEGATIVE  Hepatic function panel     Status: Abnormal   Collection Time: 10/20/20  5:33 PM  Result Value Ref Range   Total Protein 5.9 (L) 6.5 - 8.1 g/dL   Albumin 2.9 (L) 3.5 - 5.0 g/dL   AST 22 15 - 41 U/L   ALT 23 0 - 44 U/L   Alkaline Phosphatase 85 38 - 126 U/L   Total Bilirubin 1.3 (H) 0.3 - 1.2 mg/dL   Bilirubin, Direct 0.3 (H) 0.0 - 0.2 mg/dL   Indirect Bilirubin 1.0 (H) 0.3 - 0.9 mg/dL  Brain natriuretic peptide     Status: Abnormal   Collection Time: 10/20/20  5:33 PM  Result Value Ref Range   B Natriuretic Peptide 230.9 (H) 0.0 - 100.0 pg/mL  Lactic acid, plasma     Status: None   Collection Time: 10/20/20  8:20 PM  Result Value Ref Range   Lactic Acid, Venous 1.0 0.5 - 1.9 mmol/L  Protime-INR     Status: Abnormal   Collection Time: 10/20/20  8:20 PM  Result Value Ref Range   Prothrombin Time 17.9 (H) 11.4 - 15.2 seconds   INR 1.5 (H) 0.8 - 1.2  APTT     Status: Abnormal   Collection Time: 10/20/20  8:20 PM  Result Value Ref Range   aPTT 38 (H) 24 - 36 seconds   ____________________________________________  EKG My review and personal interpretation at Time: 17:25   Indication: sepsis  Rate: 100  Rhythm:afib Axis: normal Other: nonspeicifc st abn, no stemi ____________________________________________  RADIOLOGY  I personally reviewed all radiographic images ordered to evaluate for the above acute complaints and reviewed radiology reports and findings.  These findings were personally discussed with the patient.  Please see medical record for radiology report.  ____________________________________________   PROCEDURES  Procedure(s) performed:  Procedures    Critical Care performed: no ____________________________________________   INITIAL IMPRESSION / ASSESSMENT AND PLAN / ED  COURSE  Pertinent labs & imaging results that were available during my care of the patient were reviewed by me and considered in my medical decision making (see chart for details).   DDX: sepsis, copd, pna, uti, dehydration,   [name] Browning is a 81 y.o. who presents to the ED with presentation as described above concerning for sepsis.  Blood present for above differential.  Was noted to be acutely hypoxic but is protecting his airway and stable on 4 L nasal cannula.  Clinical Course as of 10/20/20 2306  Tue Oct 20, 2020  2141 Urine with many whites but no bacteria seen given his hypoxia is more likely respiratory in nature.  He is currently on 4L will give additional nebulizer treatment we will discontinue his IV fluids given concern for possible edema.  Lactate is normal.  Discussed with hospitalist for admission. [PR]  2154 Patient had another soft blood pressure will give additional small bolus of IV fluid.  [facility] chest x-rays are representative of pneumonia and given his fever and presentation then congestive heart failure.  Will continue to monitor. [PR]    Clinical Course User Index [PR] Merlyn Lot, MD    The patient was evaluated in Emergency Department today for the symptoms described in the history of present illness. He/she was evaluated in the context of the global COVID-19 pandemic, which necessitated consideration that the patient might be at risk for infection with the SARS-CoV-2 virus that causes COVID-19. Institutional protocols and algorithms that pertain to the evaluation of patients at risk for COVID-19 are in a state of rapid change based on information released by regulatory bodies including the CDC and federal and state organizations. These policies and algorithms were followed during the patient's care in the ED.  As part of my medical decision making, I reviewed the following data within the [facility] notes reviewed and incorporated, Labs  reviewed, notes from prior ED visits and [place] Controlled Substance Database   ____________________________________________   FINAL CLINICAL IMPRESSION(S) / ED DIAGNOSES  Final diagnoses:  Sepsis with acute hypoxic respiratory failure, due to unspecified organism, unspecified whether septic shock present ([place])      NEW MEDICATIONS STARTED DURING THIS VISIT:  New Prescriptions   No medications on file     Note:  This document was prepared using Dragon voice recognition software and may include unintentional dictation errors.    Merlyn Lot, MD 10/20/20 2306

## 2020-10-20 NOTE — Progress Notes (Signed)
CODE SEPSIS - PHARMACY COMMUNICATION  **Broad Spectrum Antibiotics should be administered within 1 hour of Sepsis diagnosis**  Time Code Sepsis Called/Page Received: 1810  Antibiotics Ordered: azithromycin and cefepime   Time of 1st antibiotic administration: [place], PharmD Pharmacy Resident  10/20/2020 6:11 PM

## 2020-10-20 NOTE — ED Notes (Addendum)
Pt noted to be incontinent of bladder with gross hematuria. Full bed change, appropriate pericare provided. Urine sample sent to lab.   Primofit placed to low suction. Pt noted to be in moderate resp distress after bed change. Oxygen increased to 6 L via [place]. Pt desat to 80% during bed change.  MD aware.    Pt now in bed with family at bedside, denies other needs.

## 2020-10-20 NOTE — Sepsis Progress Note (Signed)
Code sepsis protocol being monitored by eLink. 

## 2020-10-20 NOTE — ED Notes (Signed)
First contact: Assumed care of patient. White board updated, patient updated to plan of care. 6 warm blankets removed r/t pt's febrile status. Family & patient aware of need for urine sample.   Call light in reach, denies further needs at this time.

## 2020-10-20 NOTE — ED Notes (Signed)
2 sets blood cultures drawn. Grey top and blue top tubes already sent with initial labs.

## 2020-10-20 NOTE — Progress Notes (Signed)
PHARMACY -  BRIEF ANTIBIOTIC NOTE   Pharmacy has received consult(s) for cefepime from an ED provider.  The patient's profile has been reviewed for ht/wt/allergies/indication/available labs.    One time order(s) placed for cefepime 2g  Further antibiotics/pharmacy consults should be ordered by admitting physician if indicated.                       Thank you, Narda Rutherford, PharmD Pharmacy Resident  10/20/2020 6:26 PM

## 2020-10-21 DIAGNOSIS — R6521 Severe sepsis with septic shock: Secondary | ICD-10-CM

## 2020-10-21 DIAGNOSIS — A419 Sepsis, unspecified organism: Secondary | ICD-10-CM

## 2020-10-21 DIAGNOSIS — J9621 Acute and chronic respiratory failure with hypoxia: Secondary | ICD-10-CM

## 2020-10-21 LAB — BASIC METABOLIC PANEL
Anion gap: 8 (ref 5–15)
BUN: 18 mg/dL (ref 8–23)
CO2: 26 mmol/L (ref 22–32)
Calcium: 7.7 mg/dL — ABNORMAL LOW (ref 8.9–10.3)
Chloride: 103 mmol/L (ref 98–111)
Creatinine, Ser: 0.96 mg/dL (ref 0.61–1.24)
GFR, Estimated: >60 mL/min (ref >60)
Glucose, Bld: 113 mg/dL — ABNORMAL HIGH (ref 70–99)
Potassium: 3.7 mmol/L (ref 3.5–5.1)
Sodium: 137 mmol/L (ref 135–145)

## 2020-10-21 LAB — CBC
HCT: 28.8 % — ABNORMAL LOW (ref 39.0–52.0)
Hemoglobin: 9.2 g/dL — ABNORMAL LOW (ref 13.0–17.0)
MCH: 31.3 pg (ref 26.0–34.0)
MCHC: 31.9 g/dL (ref 30.0–36.0)
MCV: 98 fL (ref 80.0–100.0)
Platelets: 128 10*3/uL — ABNORMAL LOW (ref 150–400)
RBC: 2.94 MIL/uL — ABNORMAL LOW (ref 4.22–5.81)
RDW: 13.8 % (ref 11.5–15.5)
WBC: 8.2 10*3/uL (ref 4.0–10.5)
nRBC: 0 % (ref 0.0–0.2)

## 2020-10-21 LAB — LACTIC ACID, PLASMA
Lactic Acid, Venous: 1.8 mmol/L (ref 0.5–1.9)
Lactic Acid, Venous: 1.7 mmol/L (ref 0.5–1.9)

## 2020-10-21 LAB — PROCALCITONIN: Procalcitonin: <0.1 ng/mL

## 2020-10-21 LAB — STREP PNEUMONIAE URINARY ANTIGEN: Strep Pneumo Urinary Antigen: NEGATIVE

## 2020-10-21 MED ORDER — BUDESONIDE 0.25 MG/2ML IN SUSP
0.2500 mg | Freq: Two times a day (BID) | RESPIRATORY_TRACT | Status: DC
  Administered 2020-10-21 – 2020-10-29 (×17): 0.25 mg via RESPIRATORY_TRACT
  Filled 2020-10-21 (×17): qty 2

## 2020-10-21 MED ORDER — NOREPINEPHRINE 4 MG/250ML-% IV SOLN: 0.0000 ug/min | INTRAVENOUS | Status: DC

## 2020-10-21 MED ORDER — SODIUM CHLORIDE 0.9 % IV BOLUS
1000.0000 mL | Freq: Once | INTRAVENOUS | Status: AC
  Administered 2020-10-21: 1000 mL via INTRAVENOUS

## 2020-10-21 MED ORDER — METHYLPREDNISOLONE SODIUM SUCC 40 MG IJ SOLR
20.0000 mg | Freq: Two times a day (BID) | INTRAMUSCULAR | Status: DC
  Administered 2020-10-21 – 2020-10-22 (×3): 20 mg via INTRAVENOUS
  Filled 2020-10-21 (×3): qty 1

## 2020-10-21 MED ORDER — NOREPINEPHRINE 4 MG/250ML-% IV SOLN
2.0000 ug/min | INTRAVENOUS | Status: DC
  Administered 2020-10-21: 2 ug/min via INTRAVENOUS

## 2020-10-21 MED ORDER — SODIUM CHLORIDE 0.9 % IV SOLN
250.0000 mL | INTRAVENOUS | Status: DC
  Administered 2020-10-22 – 2020-10-28 (×2): 250 mL via INTRAVENOUS

## 2020-10-21 MED ORDER — NOREPINEPHRINE 4 MG/250ML-% IV SOLN
INTRAVENOUS | Status: AC
  Administered 2020-10-21: 2 ug/min via INTRAVENOUS
  Filled 2020-10-21: qty 250

## 2020-10-21 NOTE — Progress Notes (Signed)
Pharmacy Antibiotic Note  [name] Browning is a 80 y.o. male with PMH of COPD with chronic hypoxemia on 2 L, PAF on Eliquis, HTN, CAD, DVT, HLD, TIA, multiple left renal stone s/p stent placement for AKI who was admitted on 10/20/2020 with pneumonia.  Pharmacy has been consulted for Cefepime dosing.  Febrile 101.1 on admin, afeb since, WBC 9.8>8.2, procal <0.1  Bcx NG <12h, Ucx and sputum sent  Plan: Cefepime -Will continue cefepime 2g IV q8h  Will continue to monitor renal function and adjust dose as clinically indicated  Height: 6\' 2"  (188 cm) Weight: 81.6 kg (180 lb) IBW/kg (Calculated) : 82.2  Temp (24hrs), Avg:99.8 F (37.7 C), Min:98 F (36.7 C), Max:101.1 F (38.4 C)  Recent Labs  Lab 10/20/20 1733 10/20/20 2020 10/21/20 0632 10/21/20 0744  WBC 9.8  --  8.2  --   CREATININE 1.13  --  0.96  --   LATICACIDVEN  --  1.0 1.8 1.7     Estimated Creatinine Clearance: 72 mL/min (by C-G formula based on SCr of 0.96 mg/dL).    Allergies  Allergen Reactions   Hydromorphone     Hallucination Pt reports he doesn't know about this    Antimicrobials this admission: Cefepime 10/11  >>  Azithromycin 10/11  >>   Microbiology results: 10/11 BCx: NG <12h 10/11 Ucx: sent  10/11 Sputum: sent    Thank you for allowing pharmacy to be a part of this patient's care.  Narda Rutherford, PharmD Pharmacy Resident  10/21/2020 10:14 AM

## 2020-10-21 NOTE — Consult Note (Signed)
NAME:  [name] Browning, MRN:  [id number], DOB:  Mar 28, 1940, LOS: 1 ADMISSION DATE:  10/20/2020, CONSULTATION DATE:  11/21/2020 REFERRING MD: Ileene Musa, MD CHIEF COMPLAINT: Near syncope with generalized weakness   HPI  80  y.o with pertinent significant PMH of COPD with chronic hypoxemia on 2 L, PAF on Eliquis, HTN, CAD, DVT, HLD, multiple left renal stone s/p stent placement for AKI who presented to the ED with chief complaints of near syncopal episode with generalized malaise.  On review of his chart, patient was recently discharged from the hospital following admission for COPD with pneumonia complicated by obstructing left proximal ureteral stone with left-sided hydronephrosis, AKI s/p left ureteral stent on 9/11.  His hospitalization was complicated further by acute hypoxic respiratory failure in the setting of interstitial edema and hypotension requiring ICU stay and BiPAP.  Following discharge patient states he continued to be short of breath with minimal exertion associated with generalized weakness, malaise, cough and fever of 100 F noted yesterday per daughter.  ED Course: On arrival to the ED, he was febrile (!) 101.1 (38.4) with blood pressure (!) 103/53 mm Hg and pulse rate 81 beats/min, RR (!) 24 breaths per minute, oxygen saturation 96% on 4 L via [place].  There were no focal neurological deficits; he was alert and oriented x4, acutely hypoxic but was protecting his airway.  Initial pertinent labs/Diagnostics WBC/Hgb/Hct/Plts:  9.8/10.8/32.4/147 (10/11 1733)  Glucose 110, calcium 8.1 otherwise unremarkable BMP BNP: 230 UA: Pyuria with no evidence of active UTI Lactate: 1.0 EKG: normal EKG, normal sinus rhythm, unchanged from previous tracings, atrial fibrillation, rate 100 beats/minute. Imaging: Chest x-ray showed increasing bilateral pulmonary infiltrates, likely edema versus pneumonia.  CT renal done showed left-sided and a ureteral stent which may be mild position, bilateral  nonobstructing renal stone.  Due to finding as above, code sepsis was initiated and patient started on broad-spectrum antibiotics with cefepime and azithromycin following IV fluid resuscitation.  Patient also received duo nebs, and was admitted by hospitalist service.  While still holding in the ED, patient was noted to be persistently hypotensive despite IV fluid resuscitation. Given persistent hypotension,  ? temp,  ? HR, ? respirations, ? BP/ systolic BP <12, MAP < 65 mm Hg, and  O2 at 91%  on 4L  concerning for development of septic shock, PCCM was consulted for medical optimization.  Past Medical History  Anxiety Aortic atherosclerosis (HCC)  Atrial fibrillation (HCC)  Bilateral carpal tunnel syndrome  CAD (coronary artery disease)  Carpal tunnel syndrome, bilateral  Chronic anticoagulation  COPD (chronic obstructive pulmonary disease) ([place])  COVID-19 virus infection  Degenerative disc disease, cervical  DVT (deep venous thrombosis) (HCC)  GERD (gastroesophageal reflux disease)  HLD (hyperlipidemia)  Hypertension  Kidney stones  Leaky heart valve  Normal coronary arteries  a. by cardiac cath in 2004; b. normal stress test in 2007 and 2011; c. Lexiscan 05/20/2014: no significant ischemia, EF 55-65%, no EKG changes, low risk study   Osteoarthritis of right shoulder  Pneumonia  Skin cancer  skin cancer on right ear and right forehead removed  Syncope  TIA (transient ischemic attack)    Significant Hospital Events   10/11: Admitted to hospitalist service with sepsis with septic shock due to pneumonia 10/12: PCCM consulted  Consults:  PCCM  Procedures:  None  Significant Diagnostic Tests:  10/11: Chest Xray>Increasing bilateral pulmonary infiltrates, likely edema or pneumonia. 10/11: Abdominal xray>Left ureteral stent is present. The stent appears to have migrated inferiorly with the  proximal pigtail lower than on prior study and the distal pigtail projecting below the  level of the symphysis pubis, possibly in the urethra or in a prolapsed bladder 10/11: CT Renal >1. Left-sided endo ureteral stent which may be malpositioned, as described above. Confirmation of the desired positioning is recommended. 2. Bilateral nonobstructing renal stones. 3. Small bilateral pleural effusions. 4. Colonic diverticulosis.  Micro Data:  10/11: SARS-CoV-2 PCR> negative 10/11: Influenza PCR> negative 10/11: Blood culture x2> 10/11: Urine Culture> 10/11: MRSA PCR>>  10/11: Strep pneumo urinary antigen> 10/11: Legionella urinary antigen>  Antimicrobials:  Cefepime 10/11> Azithromycin 10/11>  OBJECTIVE  Blood pressure 96/61, pulse 84, temperature 98 F (36.7 C), temperature source Oral, resp. rate 20, height 6\' 2"  (1.88 m), weight 81.6 kg, SpO2 92 %.        Intake/Output Summary (Last 24 hours) at 10/21/2020 0245 Last data filed at 10/21/2020 0208 Gross per 24 hour  Intake 3763 ml  Output --  Net 3763 ml   Filed Weights   10/20/20 1722  Weight: 81.6 kg   Physical Examination  GENERAL: 80 year-old critically ill patient lying in the bed with no acute distress.  EYES: Pupils equal, round, reactive to light and accommodation. No scleral icterus. Extraocular muscles intact.  HEENT: Head atraumatic, normocephalic. Oropharynx and nasopharynx clear.  NECK:  Supple, no jugular venous distention. No thyroid enlargement, no tenderness.  LUNGS: Decreased  breath sounds bilaterally, no wheezing, rales,rhonchi or crepitation. No use of accessory muscles of respiration.  CARDIOVASCULAR: S1, S2 normal. No murmurs, rubs, or gallops.  ABDOMEN: Soft, nontender, nondistended. Bowel sounds present. No organomegaly or mass.  EXTREMITIES: No pedal edema, cyanosis, or clubbing.  NEUROLOGIC: Cranial nerves II through XII are intact.  Muscle strength 5/5 in all extremities. Sensation intact. Gait not checked.  PSYCHIATRIC: The patient is alert and oriented x 3.  SKIN: No obvious  rash, lesion, or ulcer.   Labs/imaging that I havepersonally reviewed  (right click and "Reselect all SmartList Selections" daily)     Labs   CBC: Recent Labs  Lab 10/20/20 1733  WBC 9.8  HGB 10.8*  HCT 32.4*  MCV 94.5  PLT 147*    Basic Metabolic Panel: Recent Labs  Lab 10/20/20 1733  NA 139  K 3.9  CL 102  CO2 30  GLUCOSE 110*  BUN 22  CREATININE 1.13  CALCIUM 8.1*   GFR: Estimated Creatinine Clearance: 61.2 mL/min (by C-G formula based on SCr of 1.13 mg/dL). Recent Labs  Lab 10/20/20 1733 10/20/20 2020  WBC 9.8  --   LATICACIDVEN  --  1.0    Liver Function Tests: Recent Labs  Lab 10/20/20 1733  AST 22  ALT 23  ALKPHOS 85  BILITOT 1.3*  PROT 5.9*  ALBUMIN 2.9*   No results for input(s): LIPASE, AMYLASE in the last 168 hours. No results for input(s): AMMONIA in the last 168 hours.  ABG    Component Value Date/Time   PHART 7.39 09/20/2020 1758   PCO2ART 43 09/20/2020 1758   PO2ART 61 (L) 09/20/2020 1758   HCO3 26.0 09/20/2020 1758   O2SAT 90.7 09/20/2020 1758     Coagulation Profile: Recent Labs  Lab 10/20/20 2020  INR 1.5*    Cardiac Enzymes: No results for input(s): CKTOTAL, CKMB, CKMBINDEX, TROPONINI in the last 168 hours.  HbA1C: Hemoglobin A1C  Date/Time Value Ref Range Status  09/01/2015 09:51 AM 5.7  Final   Hgb A1c MFr Bld  Date/Time Value Ref Range Status  02/09/2019 06:40 AM 5.5 4.8 - 5.6 % Final    Comment:    (NOTE) Pre diabetes:          5.7%-6.4% Diabetes:              >6.4% Glycemic control for   <7.0% adults with diabetes   05/19/2014 04:21 AM 5.4 4.0 - 6.0 % Final    CBG: No results for input(s): GLUCAP in the last 168 hours.  Review of Systems:   Review of Systems  Constitutional:  Positive for chills, fever and malaise/fatigue. Negative for diaphoresis and weight loss.  HENT:  Positive for congestion. Negative for sore throat.   Eyes: Negative.   Respiratory:  Positive for cough, sputum production,  shortness of breath and wheezing. Negative for hemoptysis and stridor.   Cardiovascular: Negative.  Negative for orthopnea.  Gastrointestinal:  Positive for blood in stool. Negative for abdominal pain, constipation, diarrhea, nausea and vomiting.  Genitourinary:  Positive for frequency and urgency. Negative for flank pain and hematuria.  Musculoskeletal:  Positive for back pain and myalgias.  Skin: Negative.   Neurological:  Positive for dizziness and weakness.  Endo/Heme/Allergies: Negative.   Psychiatric/Behavioral: Negative.     Past Medical History  He,  has a past medical history of Anginal pain ([place]), Anxiety, Aortic atherosclerosis ([place]), Atrial fibrillation ([place]) (01/2019), Bilateral carpal tunnel syndrome, CAD (coronary artery disease), Carotid atherosclerosis, bilateral, Carpal tunnel syndrome, bilateral, Chronic anticoagulation, COPD (chronic obstructive pulmonary disease) ([place]), CVA (cerebral vascular accident) ([place]), Degenerative disc disease, cervical, Diastolic dysfunction, DVT (deep venous thrombosis) ([place]), GERD (gastroesophageal reflux disease), History of 2019 novel coronavirus disease (COVID-19) (02/08/2019), HLD (hyperlipidemia), Hypertension, Kidney stones, Osteoarthritis of right shoulder, Pneumonia, Respiratory failure, acute ([place]) (09/20/2020), Skin cancer of face, Syncope, TIA (transient ischemic attack) (2016), and Valvular regurgitation.   Surgical History    Past Surgical History:  Procedure Laterality Date   CARDIAC CATHETERIZATION  2004   CARPAL TUNNEL RELEASE Right 06/11/2013   CYSTOSCOPY W/ RETROGRADES  10/16/2020   Procedure: CYSTOSCOPY WITH RETROGRADE PYELOGRAM;  Surgeon: Billey Co, MD;  Location: [facility] ORS;  Service: Urology;;   CYSTOSCOPY W/ URETERAL STENT PLACEMENT Left 09/20/2020   Procedure: CYSTOSCOPY WITH RETROGRADE PYELOGRAM/URETERAL STENT PLACEMENT;  Surgeon: Janith Lima, MD;  Location: [facility] OR[facility];  Service: Urology;  Laterality: Left;    CYSTOSCOPY/URETEROSCOPY/HOLMIUM LASER/STENT PLACEMENT Left 10/16/2020   Procedure: CYSTOSCOPY/URETEROSCOPY/HOLMIUM LASER/STENT PLACEMENT;  Surgeon: Billey Co, MD;  Location: [facility] OR[facility];  Service: Urology;  Laterality: Left;   SHOULDER ARTHROSCOPY WITH OPEN ROTATOR CUFF REPAIR Right 08/24/2017   Procedure: SHOULDER ARTHROSCOPY WITH OPEN ROTATOR CUFF REPAIR;  Surgeon: Corky Mull, MD;  Location: [facility] ORS;  Service: Orthopedics;  Laterality: Right;   SKIN CANCER EXCISION  12/01/2016   right ear    SKIN CANCER EXCISION     remove from the right side of the face    THROMBECTOMY Right 2004   leg   THYROIDECTOMY  1950   Not sure if total or partial thyroidectomy.     Social History   reports that he quit smoking about 17 years ago. His smoking use included cigarettes. He started smoking about 63 years ago. He has a 46.00 pack-year smoking history. He quit smokeless tobacco use about 17 years ago.  His smokeless tobacco use included snuff. He reports that he does not currently use alcohol after a past usage of about 7.0 standard drinks per week. He reports that he does not use drugs.   Family History  His family history includes CAD in his father; Heart attack in his father; Heart disease in his mother; Hypertension in his mother.   Allergies Allergies  Allergen Reactions   Hydromorphone     Hallucination Pt reports he doesn't know about this     Home Medications  Prior to Admission medications   Medication Sig Start Date End Date Taking? Authorizing Provider  acetaminophen (TYLENOL) 500 MG tablet Take 2 tablets (1,000 mg total) by mouth every 6 (six) hours as needed for mild pain. 05/07/20  Yes Piscoya, Jacqulyn Bath, MD  amLODipine (NORVASC) 5 MG tablet Take 1 tablet (5 mg total) by mouth daily after supper. 09/28/20  Yes Pokhrel, Laxman, MD  apixaban (ELIQUIS) 5 MG TABS tablet Take 1 tablet (5 mg total) by mouth 2 (two) times daily. 09/28/20 10/28/20 Yes Pokhrel, Laxman, MD  aspirin EC 81 MG  tablet Take 81 mg by mouth daily after supper.   Yes [provider]  atorvastatin (LIPITOR) 40 MG tablet Take 40 mg by mouth daily after supper.   Yes [provider]  cetirizine (ZYRTEC) 10 MG tablet Take 10 mg by mouth daily. 09/29/20  Yes [provider]  famotidine (PEPCID) 20 MG tablet One after supper 10/06/20  Yes Tanda Rockers, MD  Ferrous Gluconate (IRON) 240 (27 Fe) MG TABS Take 27 mg by mouth daily after supper. 09/21/18  Yes [provider]  furosemide (LASIX) 20 MG tablet Take 1 tablet (20 mg total) by mouth daily after breakfast. 09/28/20 09/28/21 Yes Pokhrel, Laxman, MD  guaiFENesin-dextromethorphan (ROBITUSSIN DM) 100-10 MG/5ML syrup Take 5 mLs by mouth every 4 (four) hours as needed for cough. 09/28/20  Yes Pokhrel, Laxman, MD  isosorbide mononitrate (IMDUR) 30 MG 24 hr tablet Take 30 mg by mouth daily. 10/19/20  Yes [provider]  losartan (COZAAR) 100 MG tablet Take 100 mg by mouth daily after supper.   Yes [provider]  montelukast (SINGULAIR) 10 MG tablet Take 10 mg by mouth at bedtime.   Yes [provider]  nitrofurantoin (MACRODANTIN) 50 MG capsule Take 1 capsule (50 mg total) by mouth daily. 10/16/20  Yes Billey Co, MD  pantoprazole (PROTONIX) 40 MG tablet Take 40 mg by mouth daily after supper. 12/10/18  Yes [provider]  phenazopyridine (PYRIDIUM) 200 MG tablet Take 1 tablet (200 mg total) by mouth 3 (three) times daily as needed (bladder pain). 09/28/20  Yes Pokhrel, Laxman, MD  polyethylene glycol (MIRALAX / GLYCOLAX) 17 g packet Take 17 g by mouth daily as needed for mild constipation or moderate constipation. Patient taking differently: Take 17 g by mouth daily. 09/28/20  Yes Pokhrel, Corrie Mckusick, MD  predniSONE (DELTASONE) 10 MG tablet Take  4 each am x 2 days,   2 each am x 2 days,  1 each am x 2 days and stop 10/06/20  Yes Tanda Rockers, MD  SYMBICORT 160-4.5 MCG/ACT inhaler Inhale 2 puffs  into the lungs 2 (two) times daily as needed (shortness of breath). 12/07/18  Yes [provider]  tamsulosin (FLOMAX) 0.4 MG CAPS capsule Take 1 capsule (0.4 mg total) by mouth daily. 10/08/20  Yes Billey Co, MD    Scheduled Meds:  apixaban  5 mg Oral BID   aspirin EC  81 mg Oral QPC supper   atorvastatin  40 mg Oral QPC supper   famotidine  20 mg Oral Daily   Iron  27 mg Oral QPC supper   loratadine  10 mg  Oral Daily   mometasone-formoterol  2 puff Inhalation BID   montelukast  10 mg Oral QHS   pantoprazole  40 mg Oral QPC supper   tamsulosin  0.4 mg Oral Daily   Continuous Infusions:  sodium chloride     azithromycin     ceFEPime (MAXIPIME) IV Stopped (10/21/20 0322)   norepinephrine (LEVOPHED) Adult infusion Stopped (10/21/20 0302)   PRN Meds:.acetaminophen, guaiFENesin-dextromethorphan, phenazopyridine  Assessment & Plan:  Acute on Chronic Hypoxic Respiratory Failure secondary to Pneumonia & COPD with evidence of acute exacerbation.  Less likely pulmonary edema given mildly elevated BNP -Supplemental O2 as needed to maintain O2 saturations 88 to 92% -High risk for intubation -Follow intermittent ABG and chest x-ray as needed -Ensure adequate pulmonary hygiene  -Budesonide inhaler/nebs BID, bronchodilators PRN  Sepsis with Septic Shock due to suspected CAP Lactic: 1.0, Baseline PCT: pending, UA: Pyuria, neg for UTI, CXR: +Pneumonia, CT Renal: left-sided and a ureteral stent which may be mild position, bilateral nonobstructing renal stone  Initial interventions/workup included: 3L of NS & Cefepime/ Azithromycin -Supplemental oxygen as needed, to maintain SpO2 > 90% -Follow Blood and Urine Cx, Trend lactic/ PCT -Obtain strep pneumo, Legionella urine antigen -Monitor WBC/ fever curve -IV antibiotics: cefepime & Azithromycon -IVF hydration as needed -Continue Levophed to maintain MAP> 65 -Strict I/O's: Keep UOP >0.5 mL/kg/hr -If Persistent hypotension consider  stress dose steroids   Paroxysmal atrial fibrillation -Hypertension Hx: CAD,s/p cardiac cath in 2004; b. normal stress test in 2007 and 2011; c. Lexiscan 05/20/2014: no significant ischemia, EF 55-65%, no EKG changes, low risk study,  HLD  -Continuous cardiac monitoring -Maintain MAP greater than 65 -Lasix as blood pressure and renal function permits; currently on Lasix 20 mg at home -Hold Norvasc, losartan and Imdur in the setting of hypotension -Atorvastatin 40mg  PO qhs -Continue Apixaban -Repeat 2D Echocardiogram  Nephrolithiasis S/p left ureteral stent placed on 9/11 by Dr. Abner Greenspan and recently left ureteroscopy, laser lithotripsy, stent placement on 10/7 CT Renal now showing possible malpositioning of urethral stent -UA neg for UTI, cultures pending -Continue Tamsulon -Urology consult if appropriate   Best practice:  Diet:  Oral Pain/Anxiety/Delirium protocol (if indicated): No VAP protocol (if indicated): Not indicated DVT prophylaxis: Systemic AC GI prophylaxis: H2B and PPI Glucose control:  SSI No Central venous access:  N/A Arterial line:  N/A Foley:  N/A Mobility:  bed rest  PT consulted: N/A Last date of multidisciplinary goals of care discussion [10/12] Code Status:  full code Disposition: Stepdown   = Goals of Care = Code Status Order: FULL  Primary Emergency Contact: Perkins,Gail, Home Phone: [phone] Wishes to pursue full aggressive treatment and intervention options, including CPR and intubation, but goals of care will be addressed on going with family if that should become necessary.   Critical care time: 45 minutes     Rufina Falco, DNP, CCRN, FNP-C, AGACNP-BC Acute Care Nurse Practitioner  [facility] Pager: [phone] [place] at [facility]  .

## 2020-10-21 NOTE — ED Notes (Signed)
Md Tu notified via secure chat about persistent hypotension despite fluid bolus.    Awaiting new orders.

## 2020-10-21 NOTE — ED Notes (Signed)
Pt eating breakfast at this time.  

## 2020-10-21 NOTE — ED Notes (Signed)
Pt provided warm blanket and lip moisturizer as requested

## 2020-10-21 NOTE — Progress Notes (Signed)
PROGRESS NOTE    [name] Browning  ZTI:[id number] DOB: April 14, 1940 DOA: 10/20/2020 PCP: [facility]    Brief Narrative:  [name] Browning is a 80 y.o. male with medical history significant for COPD with chronic hypoxemia on 2 L, paroxysmal atrial fibrillation on Eliquis, hypertension, TIA and recent nephrolithiasis s/p ureteral stent who presents with concerns of weakness and increasing shortness of breath.   Patient recently hospitalized in September for abdominal pain and shortness of breath.  He was found to have COPD with pneumonia.  He also had obstructing left proximal ureteral stone with left-sided hydronephrosis, AKI and underwent left ureteral stent by urology on 9/11.  Subsequently following procedure he developed interstitial edema and hypotension requiring ICU/SDM BiPAP.   Patient reports that since returning home from this admission he has not felt back to baseline.  Has continue progression of shortness of breath and could barely walk down the hall at home.  Has been feeling weak.  Has cough.  Daughter notes temperature of 103F day.  In the ED, he was febrile up to 101.1, hypotensive down to 80s over 50s, tachycardic heart rate of 109, tachypneic RR of 24 and was on nonrebreather with 4L at the time my evaluation.   CBC showed no leukocytosis, hemoglobin stable at 10.8, platelet 147, sodium 139, potassium 3.9, BG of 110. UA shows moderate leukocyte, negative nitrite but no bacteria.  This Chest x-ray with bilateral infiltrate edema versus pneumonia.  CT renal stone search shows bilateral mild pleural effusion and possible malpositioning of urethral stent.   He was started on cefepime and Rocephin.  Also given DuoNeb x2.    10/12 off of Levophed BP stable  Consultants:  PCCM  Procedures:   Antimicrobials:      Subjective: Not much shortness of breath no cp.  No other complaints  Objective: Vitals:   10/21/20 0830 10/21/20 0845 10/21/20 1230 10/21/20 1617  BP:  125/77 117/69 (!) 161/82 122/70  Pulse: 84 86 88 80  Resp: 15 16 (!) 29 20  Temp:    (!) 97.5 F (36.4 C)  TempSrc:      SpO2: 97% 90% 97% 98%  Weight:      Height:        Intake/Output Summary (Last 24 hours) at 10/21/2020 1821 Last data filed at 10/21/2020 1618 Gross per 24 hour  Intake 3963 ml  Output 1350 ml  Net 2613 ml   Filed Weights   10/20/20 1722  Weight: 81.6 kg    Examination:  General exam: Appears calm and comfortable  Respiratory system: Clear to auscultation. Respiratory effort normal. Cardiovascular system: S1 & S2 heard, RRR. No JVD, murmurs, rubs, gallops or clicks.  Gastrointestinal system: Abdomen is nondistended, soft and nontender. Normal bowel sounds heard. Central nervous system: Alert and oriented.  Grossly intact Extremities: Pedal edema Psychiatry: . Mood & affect appropriate.     Data Reviewed: I have personally reviewed following labs and imaging studies  CBC: Recent Labs  Lab 10/20/20 1733 10/21/20 0632  WBC 9.8 8.2  HGB 10.8* 9.2*  HCT 32.4* 28.8*  MCV 94.5 98.0  PLT 147* 825*   Basic Metabolic Panel: Recent Labs  Lab 10/20/20 1733 10/21/20 0632  NA 139 137  K 3.9 3.7  CL 102 103  CO2 30 26  GLUCOSE 110* 113*  BUN 22 18  CREATININE 1.13 0.96  CALCIUM 8.1* 7.7*   GFR: Estimated Creatinine Clearance: 72 mL/min (by C-G formula based on SCr of 0.96 mg/dL).  Liver Function Tests: Recent Labs  Lab 10/20/20 1733  AST 22  ALT 23  ALKPHOS 85  BILITOT 1.3*  PROT 5.9*  ALBUMIN 2.9*   No results for input(s): LIPASE, AMYLASE in the last 168 hours. No results for input(s): AMMONIA in the last 168 hours. Coagulation Profile: Recent Labs  Lab 10/20/20 2020  INR 1.5*   Cardiac Enzymes: No results for input(s): CKTOTAL, CKMB, CKMBINDEX, TROPONINI in the last 168 hours. BNP (last 3 results) No results for input(s): PROBNP in the last 8760 hours. HbA1C: No results for input(s): HGBA1C in the last 72 hours. CBG: No  results for input(s): GLUCAP in the last 168 hours. Lipid Profile: No results for input(s): CHOL, HDL, LDLCALC, TRIG, CHOLHDL, LDLDIRECT in the last 72 hours. Thyroid Function Tests: No results for input(s): TSH, T4TOTAL, FREET4, T3FREE, THYROIDAB in the last 72 hours. Anemia Panel: No results for input(s): VITAMINB12, FOLATE, FERRITIN, TIBC, IRON, RETICCTPCT in the last 72 hours. Sepsis Labs: Recent Labs  Lab 10/20/20 2020 10/21/20 8127 10/21/20 0744  PROCALCITON  --  <0.10  --   LATICACIDVEN 1.0 1.8 1.7    Recent Results (from the past 240 hour(s))  Resp Panel by RT-PCR (Flu A&B, Covid) Nasopharyngeal Swab     Status: None   Collection Time: 10/20/20  5:33 PM   Specimen: Nasopharyngeal Swab; Nasopharyngeal(NP) swabs in vial transport medium  Result Value Ref Range Status   SARS Coronavirus 2 by RT PCR NEGATIVE NEGATIVE Final    Comment: (NOTE) SARS-CoV-2 target nucleic acids are NOT DETECTED.  The SARS-CoV-2 RNA is generally detectable in upper respiratory specimens during the acute phase of infection. The lowest concentration of SARS-CoV-2 viral copies this assay can detect is 138 copies/mL. A negative result does not preclude SARS-Cov-2 infection and should not be used as the sole basis for treatment or other patient management decisions. A negative result may occur with  improper specimen collection/handling, submission of specimen other than nasopharyngeal swab, presence of viral mutation(s) within the areas targeted by this assay, and inadequate number of viral copies(<138 copies/mL). A negative result must be combined with clinical observations, patient history, and epidemiological information. The expected result is Negative.  Fact Sheet for Patients:  EntrepreneurPulse.com.au  Fact Sheet for Healthcare Providers:  IncredibleEmployment.be  This test is no t yet approved or cleared by the [place] FDA and  has been  authorized for detection and/or diagnosis of SARS-CoV-2 by FDA under an Emergency Use Authorization (EUA). This EUA will remain  in effect (meaning this test can be used) for the duration of the COVID-19 declaration under Section 564(b)(1) of the Act, 21 U.S.C.section 360bbb-3(b)(1), unless the authorization is terminated  or revoked sooner.       Influenza A by PCR NEGATIVE NEGATIVE Final   Influenza B by PCR NEGATIVE NEGATIVE Final    Comment: (NOTE) The Xpert Xpress SARS-CoV-2/FLU/RSV plus assay is intended as an aid in the diagnosis of influenza from Nasopharyngeal swab specimens and should not be used as a sole basis for treatment. Nasal washings and aspirates are unacceptable for Xpert Xpress SARS-CoV-2/FLU/RSV testing.  Fact Sheet for Patients: EntrepreneurPulse.com.au  Fact Sheet for Healthcare Providers: IncredibleEmployment.be  This test is not yet approved or cleared by the [place] FDA and has been authorized for detection and/or diagnosis of SARS-CoV-2 by FDA under an Emergency Use Authorization (EUA). This EUA will remain in effect (meaning this test can be used) for the duration of the COVID-19 declaration under Section 564(b)(1) of  the Act, 21 U.S.C. section 360bbb-3(b)(1), unless the authorization is terminated or revoked.  Performed at [facility], [address] [place], [place] [address]   Blood Culture (routine x 2)     Status: None (Preliminary result)   Collection Time: 10/20/20  5:58 PM   Specimen: BLOOD  Result Value Ref Range Status   Specimen Description BLOOD BLOOD LEFT FOREARM  Final   Special Requests   Final    BOTTLES DRAWN AEROBIC AND ANAEROBIC Blood Culture results may not be optimal due to an excessive volume of blood received in culture bottles   Culture   Final    NO GROWTH < 12 HOURS Performed at [facility], [place], [address] [place],  [address]    Report Status  PENDING  Incomplete  Blood Culture (routine x 2)     Status: None (Preliminary result)   Collection Time: 10/20/20  5:58 PM   Specimen: BLOOD  Result Value Ref Range Status   Specimen Description BLOOD RIGHT ANTECUBITAL  Final   Special Requests   Final    BOTTLES DRAWN AEROBIC AND ANAEROBIC Blood Culture results may not be optimal due to an excessive volume of blood received in culture bottles   Culture   Final    NO GROWTH < 12 HOURS Performed at [facility], [address] [place],  [address]    Report Status PENDING  Incomplete         Radiology Studies: DG Abdomen 1 View  Result Date: 10/20/2020 CLINICAL DATA:  Sepsis.  Evaluate for stent placement. EXAM: ABDOMEN - 1 VIEW COMPARISON:  Fluoroscopy 10/16/2020 FINDINGS: A left ureteral stent is present. The proximal pigtail projects to the left of L3. This is lower than on the previous fluoroscopic image. The distal pigtail projects over the perineum below the level of the symphysis pubis. This suggests inferior migration of the stent with the distal pigtail probably either in the urethra or possibly in a prolapsed bladder. Scattered gas and stool throughout the colon and small bowel. No small or large bowel distention. No radiopaque stones are identified. Degenerative changes in the spine and hips. IMPRESSION: Left ureteral stent is present. The stent appears to have migrated inferiorly with the proximal pigtail lower than on prior study and the distal pigtail projecting below the level of the symphysis pubis, possibly in the urethra or in a prolapsed bladder. Electronically Signed   By: Lucienne Capers M.D.   On: 10/20/2020 20:18   DG Chest Port 1 View  Result Date: 10/20/2020 CLINICAL DATA:  Questionable sepsis.  Evaluate for abnormality. EXAM: PORTABLE CHEST 1 VIEW COMPARISON:  10/06/2020 FINDINGS: Mild cardiac enlargement. Diffuse airspace and interstitial infiltration throughout both lungs, progressing since  prior study. This may represent edema or multifocal pneumonia. No pleural effusions. No pneumothorax. Mediastinal contours appear intact. Calcification of the aorta. Degenerative changes in the spine and shoulders. IMPRESSION: Increasing bilateral pulmonary infiltrates, likely edema or pneumonia. Electronically Signed   By: Lucienne Capers M.D.   On: 10/20/2020 19:08   CT Renal Stone Study  Result Date: 10/20/2020 CLINICAL DATA:  Intermittent weakness, feeling as if he would faint. EXAM: CT ABDOMEN AND PELVIS WITHOUT CONTRAST TECHNIQUE: Multidetector CT imaging of the abdomen and pelvis was performed following the standard protocol without IV contrast. COMPARISON:  September 18, 2020 FINDINGS: Lower chest: Moderate severity chronic appearing fibrotic changes are seen involving the bilateral lung bases. Small bilateral pleural effusions are noted. Hepatobiliary: No focal liver abnormality  is seen. No gallstones, gallbladder wall thickening, or biliary dilatation. Pancreas: Unremarkable. No pancreatic ductal dilatation or surrounding inflammatory changes. Spleen: A punctate calcified granuloma is seen within an otherwise normal-appearing spleen. Adrenals/Urinary Tract: Adrenal glands are unremarkable. Kidneys are normal in size, without focal lesions. A 2 mm nonobstructing renal stone is seen within the upper pole of the right kidney. Clusters of subcentimeter nonobstructing renal stones are seen within the mid and lower left kidney. A left-sided endo ureteral stent is in place. The proximal portion of the stent is seen within the proximal left ureter, just beyond the left UPJ. The distal end extends into the urinary bladder, continues through the prostate gland in serpentine fashion to the proximal to mid portion of the urethra (axial CT image 97, CT series 2). The urinary bladder is empty and subsequently limited in evaluation. Stomach/Bowel: Stomach is within normal limits. Appendix appears normal. No evidence  of bowel dilatation. Numerous diverticula are seen within the descending and sigmoid colon. Vascular/Lymphatic: Aortic atherosclerosis. No enlarged abdominal or pelvic lymph nodes. Reproductive: The prostate gland is moderately enlarged. A moderate amount of prostate gland calcification is seen. Other: A 4.4 cm x 2.7 cm fat containing right inguinal hernia is seen. No abdominopelvic ascites. Musculoskeletal: Multilevel degenerative changes seen throughout the lumbar spine. IMPRESSION: 1. Left-sided endo ureteral stent which may be malpositioned, as described above. Confirmation of the desired positioning is recommended. 2. Bilateral nonobstructing renal stones. 3. Small bilateral pleural effusions. 4. Colonic diverticulosis. Aortic Atherosclerosis (ICD10-I70.0). Electronically Signed   By: Virgina Norfolk M.D.   On: 10/20/2020 21:54        Scheduled Meds:  apixaban  5 mg Oral BID   aspirin EC  81 mg Oral QPC supper   atorvastatin  40 mg Oral QPC supper   budesonide (PULMICORT) nebulizer solution  0.25 mg Nebulization BID   famotidine  20 mg Oral Daily   ferrous gluconate  324 mg Oral QPC supper   loratadine  10 mg Oral Daily   methylPREDNISolone (SOLU-MEDROL) injection  20 mg Intravenous Q12H   montelukast  10 mg Oral QHS   pantoprazole  40 mg Oral QPC supper   tamsulosin  0.4 mg Oral Daily   Continuous Infusions:  sodium chloride     azithromycin     ceFEPime (MAXIPIME) IV Stopped (10/21/20 0925)   norepinephrine (LEVOPHED) Adult infusion Stopped (10/21/20 0925)    Assessment & Plan:   Principal Problem:   Acute on chronic respiratory failure with hypoxia (HCC) Active Problems:   History of TIA (transient ischemic attack)   Hyperlipidemia   AF (paroxysmal atrial fibrillation) ([place])   Urinary tract obstruction by kidney stone   Community acquired pneumonia   Sepsis ([place])   Hypotension   Septic shock ([place])   Acute on chronic hypoxemic respiratory failure secondary to  community-acquired pneumonia - Patient baseline on 2 L.  He was discharged on this after COPD exacerbation in September.  Now presenting with tachypnea with respiratory rate of 24, hypoxic with ambulation and exertion requiring up to 4 L  - Previously treated for pneumonia in September as well with IV Rocephin and azithromycin.  Continue IV cefepime and azithromycin started in the ED 10/12 continue iv abx   Sepsis secondary to community-acquired pneumonia - Patient presented with fever of 101.1, tachycardia heart rate of 109, tachypnea RR of 24 with findings on chest x-ray 10/12 continue antibiotics Follow-up blood cultures and urine culture   Septic shock  - Despite aggressive IV  fluid pt continues to be hypotensive.  10/12 was started on Levophed on admission now weaned off Continue monitoring BP and hemodynamics Continue to hold amlodipine, losartan, Imdur and Lasix Follow-up cultures    Recent obstructing left ureteral stone with left hydronephrosis - Creatinine currently stable - Patient had left ureteral stent placed on 9/11 by Dr. Abner Greenspan - 10/7-underwent ureteroscopy, lithotripsy  -CT renal stone shows possible distal migration of the ureteral stent.  Will need to discuss with urology in the morning but doubt any GU infection needing urgent intervention since he has negative UA, no GI symptoms and benign abdominal exam. -continue Tamsulosin 10/12- will consult urology for malposition of stent   Paroxysmal atrial fibrillation Continue eliquis   History of TIA - Continue aspirin   Hyperlipidemia -continues statin   DVT prophylaxis: Eliquis Code Status: Full Family Communication: None at bedside Disposition Plan:  Status is: Inpatient  Remains inpatient appropriate because:Inpatient level of care appropriate due to severity of illness  Dispo: The patient is from: Home              Anticipated d/c is to: Home              Patient currently is not medically stable to  d/c.   Difficult to place patient No            LOS: 1 day   Time spent: 35 minutes with more than 50% on [place], MD [facility] Pager [phone]  If 7PM-7AM, please contact night-coverage 10/21/2020, 6:21 PM

## 2020-10-21 NOTE — ED Notes (Signed)
MD notified of persistent hypotension despite last fluid bolus.

## 2020-10-21 NOTE — ED Notes (Signed)
Pt boosted up in bed at this time. VSS. NAD. Call light within reach. Will continue to monitor.

## 2020-10-22 ENCOUNTER — Encounter: Payer: Medicaid Other | Admitting: Urology

## 2020-10-22 DIAGNOSIS — R8281 Pyuria: Secondary | ICD-10-CM

## 2020-10-22 DIAGNOSIS — J9621 Acute and chronic respiratory failure with hypoxia: Secondary | ICD-10-CM | POA: Diagnosis not present

## 2020-10-22 DIAGNOSIS — A419 Sepsis, unspecified organism: Principal | ICD-10-CM

## 2020-10-22 DIAGNOSIS — J188 Other pneumonia, unspecified organism: Secondary | ICD-10-CM

## 2020-10-22 DIAGNOSIS — R3129 Other microscopic hematuria: Secondary | ICD-10-CM

## 2020-10-22 LAB — EXPECTORATED SPUTUM ASSESSMENT W GRAM STAIN, RFLX TO RESP C

## 2020-10-22 LAB — BASIC METABOLIC PANEL
Anion gap: 5 (ref 5–15)
BUN: 18 mg/dL (ref 8–23)
CO2: 24 mmol/L (ref 22–32)
Calcium: 8.4 mg/dL — ABNORMAL LOW (ref 8.9–10.3)
Chloride: 108 mmol/L (ref 98–111)
Creatinine, Ser: 0.75 mg/dL (ref 0.61–1.24)
GFR, Estimated: >60 mL/min (ref >60)
Glucose, Bld: 146 mg/dL — ABNORMAL HIGH (ref 70–99)
Potassium: 4.4 mmol/L (ref 3.5–5.1)
Sodium: 137 mmol/L (ref 135–145)

## 2020-10-22 LAB — CBC
HCT: 30.5 % — ABNORMAL LOW (ref 39.0–52.0)
Hemoglobin: 9.8 g/dL — ABNORMAL LOW (ref 13.0–17.0)
MCH: 30.2 pg (ref 26.0–34.0)
MCHC: 32.1 g/dL (ref 30.0–36.0)
MCV: 93.8 fL (ref 80.0–100.0)
Platelets: 145 10*3/uL — ABNORMAL LOW (ref 150–400)
RBC: 3.25 MIL/uL — ABNORMAL LOW (ref 4.22–5.81)
RDW: 13.3 % (ref 11.5–15.5)
WBC: 8 10*3/uL (ref 4.0–10.5)
nRBC: 0 % (ref 0.0–0.2)

## 2020-10-22 LAB — BRAIN NATRIURETIC PEPTIDE: B Natriuretic Peptide: 322 pg/mL — ABNORMAL HIGH (ref 0.0–100.0)

## 2020-10-22 LAB — LEGIONELLA PNEUMOPHILA SEROGP 1 UR AG: L. pneumophila Serogp 1 Ur Ag: NEGATIVE

## 2020-10-22 LAB — PROCALCITONIN: Procalcitonin: <0.1 ng/mL

## 2020-10-22 MED ORDER — FUROSEMIDE 10 MG/ML IJ SOLN
20.0000 mg | Freq: Once | INTRAMUSCULAR | Status: AC
  Administered 2020-10-22: 20 mg via INTRAVENOUS
  Filled 2020-10-22: qty 4

## 2020-10-22 MED ORDER — VANCOMYCIN HCL IN DEXTROSE 1-5 GM/200ML-% IV SOLN
1000.0000 mg | Freq: Two times a day (BID) | INTRAVENOUS | Status: DC
  Filled 2020-10-22: qty 200

## 2020-10-22 MED ORDER — GUAIFENESIN ER 600 MG PO TB12
600.0000 mg | ORAL_TABLET | Freq: Two times a day (BID) | ORAL | Status: DC
  Administered 2020-10-22 – 2020-10-31 (×18): 600 mg via ORAL
  Filled 2020-10-22 (×19): qty 1

## 2020-10-22 MED ORDER — VANCOMYCIN HCL 1750 MG/350ML IV SOLN
1750.0000 mg | Freq: Once | INTRAVENOUS | Status: AC
  Administered 2020-10-22: 1750 mg via INTRAVENOUS
  Filled 2020-10-22 (×2): qty 350

## 2020-10-22 MED ORDER — PREDNISONE 20 MG PO TABS
40.0000 mg | ORAL_TABLET | Freq: Every day | ORAL | Status: DC
  Administered 2020-10-23 – 2020-10-24 (×2): 40 mg via ORAL
  Filled 2020-10-22 (×2): qty 2

## 2020-10-22 NOTE — Progress Notes (Signed)
Other PROGRESS NOTE    [name] Browning  NOB:[id number] DOB: [birth year] DOA: 10/20/2020 PCP: [place]    Brief Narrative:  [name] Browning is a 80 y.o. male with medical history significant for COPD with chronic hypoxemia on 2 L, paroxysmal atrial fibrillation on Eliquis, hypertension, TIA and recent nephrolithiasis s/p ureteral stent who presents with concerns of weakness and increasing shortness of breath.   Patient recently hospitalized in September for abdominal pain and shortness of breath.  He was found to have COPD with pneumonia.  He also had obstructing left proximal ureteral stone with left-sided hydronephrosis, AKI and underwent left ureteral stent by urology on 9/11.  Subsequently following procedure he developed interstitial edema and hypotension requiring ICU/SDM BiPAP.   Patient reports that since returning home from this admission he has not felt back to baseline.  Has continue progression of shortness of breath and could barely walk down the hall at home.  Has been feeling weak.  Has cough.  Daughter notes temperature of 103F day.  In the ED, he was febrile up to 101.1, hypotensive down to 80s over 50s, tachycardic heart rate of 109, tachypneic RR of 24 and was on nonrebreather with 4L at the time my evaluation.   CBC showed no leukocytosis, hemoglobin stable at 10.8, platelet 147, sodium 139, potassium 3.9, BG of 110. UA shows moderate leukocyte, negative nitrite but no bacteria.  This Chest x-ray with bilateral infiltrate edema versus pneumonia.  CT renal stone search shows bilateral mild pleural effusion and possible malpositioning of urethral stent.   He was started on cefepime and Rocephin.  Also given DuoNeb x2.    10/12 off of Levophed BP stable 1013 feels short of breath today.  No other complaint  Consultants:  PCCM  Procedures:   Antimicrobials:      Subjective: No cp , dizziness, or lightheadedness  Objective: Vitals:   10/22/20 0400 10/22/20  0600 10/22/20 0800 10/22/20 0900  BP: 118/76 131/80 134/71   Pulse: 70 78 81 78  Resp: (!) 24 18 (!) 25 (!) 21  Temp:      TempSrc:      SpO2: 94% 96% 96% 93%  Weight:      Height:        Intake/Output Summary (Last 24 hours) at 10/22/2020 0951 Last data filed at 10/22/2020 0636 Gross per 24 hour  Intake 1448.94 ml  Output 1950 ml  Net -501.06 ml   Filed Weights   10/20/20 1722  Weight: 81.6 kg    Examination: Calm, nad +JVD Rhonchorus b/l, no wheezing Reg s1/s2 no gallop Soft benign +bs No edema Aaoxox3 Mood and affect appropriate in current setting    Data Reviewed: I have personally reviewed following labs and imaging studies  CBC: Recent Labs  Lab 10/20/20 1733 10/21/20 0632 10/22/20 0624  WBC 9.8 8.2 8.0  HGB 10.8* 9.2* 9.8*  HCT 32.4* 28.8* 30.5*  MCV 94.5 98.0 93.8  PLT 147* 128* 947*   Basic Metabolic Panel: Recent Labs  Lab 10/20/20 1733 10/21/20 0632 10/22/20 0624  NA 139 137 137  K 3.9 3.7 4.4  CL 102 103 108  CO2 30 26 24   GLUCOSE 110* 113* 146*  BUN 22 18 18   CREATININE 1.13 0.96 0.75  CALCIUM 8.1* 7.7* 8.4*   GFR: Estimated Creatinine Clearance: 86.4 mL/min (by C-G formula based on SCr of 0.75 mg/dL). Liver Function Tests: Recent Labs  Lab 10/20/20 1733  AST 22  ALT 23  ALKPHOS 85  BILITOT 1.3*  PROT 5.9*  ALBUMIN 2.9*   No results for input(s): LIPASE, AMYLASE in the last 168 hours. No results for input(s): AMMONIA in the last 168 hours. Coagulation Profile: Recent Labs  Lab 10/20/20 2020  INR 1.5*   Cardiac Enzymes: No results for input(s): CKTOTAL, CKMB, CKMBINDEX, TROPONINI in the last 168 hours. BNP (last 3 results) No results for input(s): PROBNP in the last 8760 hours. HbA1C: No results for input(s): HGBA1C in the last 72 hours. CBG: No results for input(s): GLUCAP in the last 168 hours. Lipid Profile: No results for input(s): CHOL, HDL, LDLCALC, TRIG, CHOLHDL, LDLDIRECT in the last 72 hours. Thyroid  Function Tests: No results for input(s): TSH, T4TOTAL, FREET4, T3FREE, THYROIDAB in the last 72 hours. Anemia Panel: No results for input(s): VITAMINB12, FOLATE, FERRITIN, TIBC, IRON, RETICCTPCT in the last 72 hours. Sepsis Labs: Recent Labs  Lab 10/20/20 2020 10/21/20 3662 10/21/20 0744 10/22/20 0624  PROCALCITON  --  <0.10  --  <0.10  LATICACIDVEN 1.0 1.8 1.7  --     Recent Results (from the past 240 hour(s))  Resp Panel by RT-PCR (Flu A&B, Covid) Nasopharyngeal Swab     Status: None   Collection Time: 10/20/20  5:33 PM   Specimen: Nasopharyngeal Swab; Nasopharyngeal(NP) swabs in vial transport medium  Result Value Ref Range Status   SARS Coronavirus 2 by RT PCR NEGATIVE NEGATIVE Final    Comment: (NOTE) SARS-CoV-2 target nucleic acids are NOT DETECTED.  The SARS-CoV-2 RNA is generally detectable in upper respiratory specimens during the acute phase of infection. The lowest concentration of SARS-CoV-2 viral copies this assay can detect is 138 copies/mL. A negative result does not preclude SARS-Cov-2 infection and should not be used as the sole basis for treatment or other patient management decisions. A negative result may occur with  improper specimen collection/handling, submission of specimen other than nasopharyngeal swab, presence of viral mutation(s) within the areas targeted by this assay, and inadequate number of viral copies(<138 copies/mL). A negative result must be combined with clinical observations, patient history, and epidemiological information. The expected result is Negative.  Fact Sheet for Patients:  EntrepreneurPulse.com.au  Fact Sheet for Healthcare Providers:  IncredibleEmployment.be  This test is no t yet approved or cleared by the [place] FDA and  has been authorized for detection and/or diagnosis of SARS-CoV-2 by FDA under an Emergency Use Authorization (EUA). This EUA will remain  in effect (meaning  this test can be used) for the duration of the COVID-19 declaration under Section 564(b)(1) of the Act, 21 U.S.C.section 360bbb-3(b)(1), unless the authorization is terminated  or revoked sooner.       Influenza A by PCR NEGATIVE NEGATIVE Final   Influenza B by PCR NEGATIVE NEGATIVE Final    Comment: (NOTE) The Xpert Xpress SARS-CoV-2/FLU/RSV plus assay is intended as an aid in the diagnosis of influenza from Nasopharyngeal swab specimens and should not be used as a sole basis for treatment. Nasal washings and aspirates are unacceptable for Xpert Xpress SARS-CoV-2/FLU/RSV testing.  Fact Sheet for Patients: EntrepreneurPulse.com.au  Fact Sheet for Healthcare Providers: IncredibleEmployment.be  This test is not yet approved or cleared by the [place] FDA and has been authorized for detection and/or diagnosis of SARS-CoV-2 by FDA under an Emergency Use Authorization (EUA). This EUA will remain in effect (meaning this test can be used) for the duration of the COVID-19 declaration under Section 564(b)(1) of the Act, 21 U.S.C. section 360bbb-3(b)(1), unless the authorization is terminated or revoked.  Performed at [facility], [address] [place], [place] [address]   Blood Culture (routine x 2)     Status: None (Preliminary result)   Collection Time: 10/20/20  5:58 PM   Specimen: BLOOD  Result Value Ref Range Status   Specimen Description BLOOD BLOOD LEFT FOREARM  Final   Special Requests   Final    BOTTLES DRAWN AEROBIC AND ANAEROBIC Blood Culture results may not be optimal due to an excessive volume of blood received in culture bottles   Culture   Final    NO GROWTH 2 DAYS Performed at [facility], [address], [place], [place] [address]    Report Status PENDING  Incomplete  Blood Culture (routine x 2)     Status: None (Preliminary result)   Collection Time: 10/20/20  5:58 PM   Specimen: BLOOD  Result  Value Ref Range Status   Specimen Description BLOOD RIGHT ANTECUBITAL  Final   Special Requests   Final    BOTTLES DRAWN AEROBIC AND ANAEROBIC Blood Culture results may not be optimal due to an excessive volume of blood received in culture bottles   Culture   Final    NO GROWTH 2 DAYS Performed at [facility], [address], [place], [place] [address]    Report Status PENDING  Incomplete  Urine Culture     Status: None (Preliminary result)   Collection Time: 10/20/20  8:20 PM   Specimen: Urine, Clean Catch  Result Value Ref Range Status   Specimen Description   Final    URINE, CLEAN CATCH Performed at [facility], [address] [place], [place] [address]    Special Requests   Final    NONE Performed at [facility], [address], [place], [place] [address]    Culture   Final    CULTURE REINCUBATED FOR BETTER GROWTH Performed at [facility], [place] [address] [place], [place] [address]    Report Status PENDING  Incomplete         Radiology Studies: DG Abdomen 1 View  Result Date: 10/20/2020 CLINICAL DATA:  Sepsis.  Evaluate for stent placement. EXAM: ABDOMEN - 1 VIEW COMPARISON:  Fluoroscopy 10/16/2020 FINDINGS: A left ureteral stent is present. The proximal pigtail projects to the left of L3. This is lower than on the previous fluoroscopic image. The distal pigtail projects over the perineum below the level of the symphysis pubis. This suggests inferior migration of the stent with the distal pigtail probably either in the urethra or possibly in a prolapsed bladder. Scattered gas and stool throughout the colon and small bowel. No small or large bowel distention. No radiopaque stones are identified. Degenerative changes in the spine and hips. IMPRESSION: Left ureteral stent is present. The stent appears to have migrated inferiorly with the proximal pigtail lower than on prior study and the distal pigtail projecting below the level of the  symphysis pubis, possibly in the urethra or in a prolapsed bladder. Electronically Signed   By: Lucienne Capers M.D.   On: 10/20/2020 20:18   DG Chest Port 1 View  Result Date: 10/20/2020 CLINICAL DATA:  Questionable sepsis.  Evaluate for abnormality. EXAM: PORTABLE CHEST 1 VIEW COMPARISON:  10/06/2020 FINDINGS: Mild cardiac enlargement. Diffuse airspace and interstitial infiltration throughout both lungs, progressing since prior study. This may represent edema or multifocal pneumonia. No pleural effusions. No pneumothorax. Mediastinal contours appear intact. Calcification of the aorta. Degenerative changes in the spine and shoulders. IMPRESSION:  Increasing bilateral pulmonary infiltrates, likely edema or pneumonia. Electronically Signed   By: Lucienne Capers M.D.   On: 10/20/2020 19:08   CT Renal Stone Study  Result Date: 10/20/2020 CLINICAL DATA:  Intermittent weakness, feeling as if he would faint. EXAM: CT ABDOMEN AND PELVIS WITHOUT CONTRAST TECHNIQUE: Multidetector CT imaging of the abdomen and pelvis was performed following the standard protocol without IV contrast. COMPARISON:  September 18, 2020 FINDINGS: Lower chest: Moderate severity chronic appearing fibrotic changes are seen involving the bilateral lung bases. Small bilateral pleural effusions are noted. Hepatobiliary: No focal liver abnormality is seen. No gallstones, gallbladder wall thickening, or biliary dilatation. Pancreas: Unremarkable. No pancreatic ductal dilatation or surrounding inflammatory changes. Spleen: A punctate calcified granuloma is seen within an otherwise normal-appearing spleen. Adrenals/Urinary Tract: Adrenal glands are unremarkable. Kidneys are normal in size, without focal lesions. A 2 mm nonobstructing renal stone is seen within the upper pole of the right kidney. Clusters of subcentimeter nonobstructing renal stones are seen within the mid and lower left kidney. A left-sided endo ureteral stent is in place. The  proximal portion of the stent is seen within the proximal left ureter, just beyond the left UPJ. The distal end extends into the urinary bladder, continues through the prostate gland in serpentine fashion to the proximal to mid portion of the urethra (axial CT image 97, CT series 2). The urinary bladder is empty and subsequently limited in evaluation. Stomach/Bowel: Stomach is within normal limits. Appendix appears normal. No evidence of bowel dilatation. Numerous diverticula are seen within the descending and sigmoid colon. Vascular/Lymphatic: Aortic atherosclerosis. No enlarged abdominal or pelvic lymph nodes. Reproductive: The prostate gland is moderately enlarged. A moderate amount of prostate gland calcification is seen. Other: A 4.4 cm x 2.7 cm fat containing right inguinal hernia is seen. No abdominopelvic ascites. Musculoskeletal: Multilevel degenerative changes seen throughout the lumbar spine. IMPRESSION: 1. Left-sided endo ureteral stent which may be malpositioned, as described above. Confirmation of the desired positioning is recommended. 2. Bilateral nonobstructing renal stones. 3. Small bilateral pleural effusions. 4. Colonic diverticulosis. Aortic Atherosclerosis (ICD10-I70.0). Electronically Signed   By: Virgina Norfolk M.D.   On: 10/20/2020 21:54        Scheduled Meds:  apixaban  5 mg Oral BID   aspirin EC  81 mg Oral QPC supper   atorvastatin  40 mg Oral QPC supper   budesonide (PULMICORT) nebulizer solution  0.25 mg Nebulization BID   famotidine  20 mg Oral Daily   ferrous gluconate  324 mg Oral QPC supper   loratadine  10 mg Oral Daily   methylPREDNISolone (SOLU-MEDROL) injection  20 mg Intravenous Q12H   montelukast  10 mg Oral QHS   pantoprazole  40 mg Oral QPC supper   tamsulosin  0.4 mg Oral Daily   Continuous Infusions:  sodium chloride 20 mL/hr at 10/22/20 3154   azithromycin Stopped (10/21/20 2016)   ceFEPime (MAXIPIME) IV 2 g (10/22/20 0924)    Assessment &  Plan:   Principal Problem:   Acute on chronic respiratory failure with hypoxia (HCC) Active Problems:   History of TIA (transient ischemic attack)   Hyperlipidemia   AF (paroxysmal atrial fibrillation) ([place])   Urinary tract obstruction by kidney stone   Community acquired pneumonia   Sepsis ([place])   Hypotension   Septic shock ([place])   Acute on chronic hypoxemic respiratory failure secondary to community-acquired pneumonia versus acute diastolic HF - Patient baseline on 2 L.  He was discharged on this  after COPD exacerbation in September.  Now presenting with tachypnea with respiratory rate of 24, hypoxic with ambulation and exertion requiring up to 4 L  - Previously treated for pneumonia in September as well with IV Rocephin and azithromycin.  Continue IV cefepime and azithromycin started in the ED 10/12 continue iv abx 10/13 dc iv steroid, change to po prednisone 40mg  qd start in am. Will give iv lasix 20mg  x1 appears to be volume overloaded mildly  Bnp elevated Obtain echo  Septic shock secondary to community-acquired pneumonia versus and UTI UCX + staph. Haemol. Off levophed on 10/12.  F/u bcx F/u sensitivity Continue iv abx Will add vanco.     Staph haemolyticus UTI +ucx Will start vanco while sensitivities return   Recent obstructing left ureteral stone with left hydronephrosis - Creatinine currently stable - Patient had left ureteral stent placed on 9/11 by Dr. Abner Greenspan - 10/7-underwent ureteroscopy, lithotripsy  -CT renal stone shows possible distal migration of the ureteral stent.  Will need to discuss with urology in the morning but doubt any GU infection needing urgent intervention since he has negative UA, no GI symptoms and benign abdominal exam. -continue Tamsulosin 10/12- will consult urology for malposition of stent 10/13-urologist input was appreciated.  His urethral stent was removed.  Will need to follow-up outpatient with urology in 6 months with KUB prior  for stone monitoring   Paroxysmal atrial fibrillation Continue Eliquis   History of TIA Continue aspirin   Hyperlipidemia -continues statin   DVT prophylaxis: Eliquis Code Status: Full Family Communication: None at bedside Disposition Plan:  Status is: Inpatient  Remains inpatient appropriate because:Inpatient level of care appropriate due to severity of illness  Dispo: The patient is from: Home              Anticipated d/c is to: Home              Patient currently is not medically stable to d/c.   Difficult to place patient No            LOS: 2 days   Time spent: 45 minutes with more than 50% on [place], MD [facility] Pager [phone]  If 7PM-7AM, please contact night-coverage 10/22/2020, 9:51 AM

## 2020-10-22 NOTE — ED Notes (Signed)
Lab to add on BNP to blood already in lab

## 2020-10-22 NOTE — TOC Progression Note (Signed)
Transition of Care [facility]) - Progression Note    Patient Details  Name: [name] Browning MRN: [id number] Date of Birth: [birth year]  Transition of Care [facility]) CM/SW Contact  Shelbie Hutching, RN Phone Number: 10/22/2020, 3:25 PM  Clinical Narrative:    RNCM was contacted by Caryl Pina at [place].  Patient has oxygen through [facility] and his doctor has also ordered a Trilogy NIV that [employer] has ready for him as well as a nebulizer.  TOC needs to notify Caryl Pina at discharge so patient can get his equipment delivered.          Expected Discharge Plan and Services                                                 Social Determinants of Health (SDOH) Interventions    Readmission Risk Interventions No flowsheet data found.

## 2020-10-22 NOTE — ED Notes (Signed)
Pt assisted up to toilet, pt's external cath had leaked and bed was saturated. Linens changed clean chux placed and pt placed in clean gown. Pt assisted back to bed and given warm blankets. Pt provided with urinal at bedside.

## 2020-10-22 NOTE — ED Notes (Signed)
Pt given breakfast tray at this time. 

## 2020-10-22 NOTE — Consult Note (Addendum)
Brief Urology Consult Note  Urology consulted to see this patient who presented to the ED 2 days ago with sepsis secondary to CAP.  He is POD 6 from left uteroscopy with laser lithotripsy and stent exchange with Dr. Diamantina Providence for management of a proximal left ureteral stone.  Ureteral stent was left on a tether and was due for removal 2 days ago.  Admission UA was notable for microscopic hematuria and pyuria consistent with indwelling stent with no nitrites or bacteria.  Urine culture is pending.  I saw the patient at the bedside this morning and removed his ureteral stent in its entirety.  Patient tolerated well.  No acute concerns.  Agree with antibiotic management for CAP with sepsis vs possible UTI.  He will require outpatient follow-up in our clinic in 6 months with KUB prior for stone monitoring.

## 2020-10-22 NOTE — Progress Notes (Signed)
Pharmacy Antibiotic Note  [name] Browning is a 80 y.o. male with PMH of COPD with chronic hypoxemia on 2 L, PAF on Eliquis, HTN, CAD, DVT, HLD, TIA, multiple left renal stone s/p stent placement for AKI who was admitted on 10/20/2020 with pneumonia.  Pharmacy has been consulted for cefepime and vancomcyin dosing.  Febrile 101.1 on admin, afeb since, WBC 9.8>8.2>8.0, procal <0.1  Bcx NG x2 days, Ucx re-incubated for better growth and sputum sent  Plan: Cefepime -Will continue cefepime 2g IV q8h  Vancomycin -Pt given vancomycin 1750mg  LD in ED -Will start vancomycin 1000mg  q12h    -Est AUC: 447.2    -Css min: 12.3    -Scr used: 0.8    -Wt used: TBW    -Vd: 0.72 -Will obtain levels around 4th or 5th dose if continued   Will continue to monitor renal function and adjust dose as clinically indicated  Height: 6\' 2"  (188 cm) Weight: 81.6 kg (180 lb) IBW/kg (Calculated) : 82.2  No data recorded.  Recent Labs  Lab 10/20/20 1733 10/20/20 2020 10/21/20 0632 10/21/20 0744 10/22/20 0624  WBC 9.8  --  8.2  --  8.0  CREATININE 1.13  --  0.96  --  0.75  LATICACIDVEN  --  1.0 1.8 1.7  --      Estimated Creatinine Clearance: 86.4 mL/min (by C-G formula based on SCr of 0.75 mg/dL).    Allergies  Allergen Reactions   Hydromorphone     Hallucination Pt reports he doesn't know about this    Antimicrobials this admission: Cefepime 10/11  >>  Azithromycin 10/11  >>  Vancomycin 10/13 >>  Microbiology results: 10/11 BCx: NG x2 days 10/11 Ucx: re-incubated for better growth 10/11 Sputum: sent    Thank you for allowing pharmacy to be a part of this patient's care.  Narda Rutherford, PharmD Pharmacy Resident  10/22/2020 5:29 PM

## 2020-10-22 NOTE — Progress Notes (Signed)
Pharmacy Antibiotic Note  [name] Browning is a 80 y.o. male with PMH of COPD with chronic hypoxemia on 2 L, PAF on Eliquis, HTN, CAD, DVT, HLD, TIA, multiple left renal stone s/p stent placement for AKI who was admitted on 10/20/2020 with pneumonia.  Pharmacy has been consulted for Cefepime dosing.  Febrile 101.1 on admin, afeb since, WBC 9.8>8.2>8.0, procal <0.1  Bcx NG x2 days, Ucx re-incubated for better growth and sputum sent  Plan: Cefepime -Will continue cefepime 2g IV q8h  Will continue to monitor renal function and adjust dose as clinically indicated  Height: 6\' 2"  (188 cm) Weight: 81.6 kg (180 lb) IBW/kg (Calculated) : 82.2  Temp (24hrs), Avg:97.5 F (36.4 C), Min:97.5 F (36.4 C), Max:97.5 F (36.4 C)  Recent Labs  Lab 10/20/20 1733 10/20/20 2020 10/21/20 0632 10/21/20 0744 10/22/20 0624  WBC 9.8  --  8.2  --  8.0  CREATININE 1.13  --  0.96  --  0.75  LATICACIDVEN  --  1.0 1.8 1.7  --      Estimated Creatinine Clearance: 86.4 mL/min (by C-G formula based on SCr of 0.75 mg/dL).    Allergies  Allergen Reactions   Hydromorphone     Hallucination Pt reports he doesn't know about this    Antimicrobials this admission: Cefepime 10/11  >>  Azithromycin 10/11  >>   Microbiology results: 10/11 BCx: NG x2 days 10/11 Ucx: re-incubated for better growth 10/11 Sputum: sent    Thank you for allowing pharmacy to be a part of this patient's care.  Narda Rutherford, PharmD Pharmacy Resident  10/22/2020 10:03 AM

## 2020-10-23 ENCOUNTER — Other Ambulatory Visit (HOSPITAL_COMMUNITY): Payer: Self-pay

## 2020-10-23 ENCOUNTER — Inpatient Hospital Stay: Payer: Medicare HMO

## 2020-10-23 ENCOUNTER — Inpatient Hospital Stay (HOSPITAL_COMMUNITY)
Admit: 2020-10-23 | Discharge: 2020-10-23 | Disposition: A | Discharge status: Home / Self Care | Payer: Medicare HMO | Attending: Internal Medicine | Admitting: Internal Medicine

## 2020-10-23 DIAGNOSIS — I5031 Acute diastolic (congestive) heart failure: Secondary | ICD-10-CM | POA: Diagnosis not present

## 2020-10-23 DIAGNOSIS — J9621 Acute and chronic respiratory failure with hypoxia: Secondary | ICD-10-CM | POA: Diagnosis not present

## 2020-10-23 LAB — BASIC METABOLIC PANEL
Anion gap: 4 — ABNORMAL LOW (ref 5–15)
BUN: 31 mg/dL — ABNORMAL HIGH (ref 8–23)
CO2: 25 mmol/L (ref 22–32)
Calcium: 8.2 mg/dL — ABNORMAL LOW (ref 8.9–10.3)
Chloride: 107 mmol/L (ref 98–111)
Creatinine, Ser: 1.08 mg/dL (ref 0.61–1.24)
GFR, Estimated: >60 mL/min (ref >60)
Glucose, Bld: 108 mg/dL — ABNORMAL HIGH (ref 70–99)
Potassium: 4 mmol/L (ref 3.5–5.1)
Sodium: 136 mmol/L (ref 135–145)

## 2020-10-23 LAB — ECHOCARDIOGRAM COMPLETE
AR max vel: 2.83 cm2
AV Area VTI: 3.04 cm2
AV Area mean vel: 2.84 cm2
AV Mean grad: 2 mmHg
AV Peak grad: 4.7 mmHg
Ao pk vel: 1.08 m/s
Area-P 1/2: 4.52 cm2
Height: 74 in
S' Lateral: 2.93 cm
Weight: 2880 oz

## 2020-10-23 LAB — CBC
HCT: 33.7 % — ABNORMAL LOW (ref 39.0–52.0)
Hemoglobin: 10.8 g/dL — ABNORMAL LOW (ref 13.0–17.0)
MCH: 31 pg (ref 26.0–34.0)
MCHC: 32 g/dL (ref 30.0–36.0)
MCV: 96.8 fL (ref 80.0–100.0)
Platelets: 155 10*3/uL (ref 150–400)
RBC: 3.48 MIL/uL — ABNORMAL LOW (ref 4.22–5.81)
RDW: 13.9 % (ref 11.5–15.5)
WBC: 9.6 10*3/uL (ref 4.0–10.5)
nRBC: 0 % (ref 0.0–0.2)

## 2020-10-23 LAB — PROCALCITONIN: Procalcitonin: <0.1 ng/mL

## 2020-10-23 MED ORDER — IPRATROPIUM-ALBUTEROL 0.5-2.5 (3) MG/3ML IN SOLN
3.0000 mL | Freq: Three times a day (TID) | RESPIRATORY_TRACT | Status: DC
  Administered 2020-10-23 – 2020-10-27 (×12): 3 mL via RESPIRATORY_TRACT
  Filled 2020-10-23 (×12): qty 3

## 2020-10-23 MED ORDER — VANCOMYCIN HCL 1750 MG/350ML IV SOLN
1750.0000 mg | INTRAVENOUS | Status: DC
  Administered 2020-10-23 – 2020-10-24 (×2): 1750 mg via INTRAVENOUS
  Filled 2020-10-23 (×3): qty 350

## 2020-10-23 MED ORDER — AZITHROMYCIN 500 MG IV SOLR
500.0000 mg | INTRAVENOUS | Status: AC
  Administered 2020-10-23 – 2020-10-24 (×2): 500 mg via INTRAVENOUS
  Filled 2020-10-23 (×2): qty 500

## 2020-10-23 MED ORDER — ALBUTEROL SULFATE (2.5 MG/3ML) 0.083% IN NEBU: 2.5000 mg | INHALATION_SOLUTION | RESPIRATORY_TRACT | Status: DC | PRN

## 2020-10-23 NOTE — Plan of Care (Signed)
New admit A&oX4, oriented to the unit, VS stable, had one episode of 2.39 second pause, patient comfortable sleeping. NP made aware. Will continue to monitor the patient Problem: Clinical Measurements:  Goal: Ability to maintain clinical measurements within normal limits will improve Outcome: Progressing Goal: Will remain free from infection Outcome: Progressing Goal: Diagnostic test results will improve Outcome: Progressing Goal: Respiratory complications will improve Outcome: Progressing   Problem: Activity: Goal: Risk for activity intolerance will decrease Outcome: Progressing   Problem: Nutrition: Goal: Adequate nutrition will be maintained Outcome: Progressing   Problem: Elimination: Goal: Will not experience complications related to bowel motility Outcome: Progressing Goal: Will not experience complications related to urinary retention Outcome: Progressing   Problem: Pain Managment: Goal: General experience of comfort will improve Outcome: Progressing   Problem: Safety: Goal: Ability to remain free from injury will improve Outcome: Progressing   Problem: Skin Integrity: Goal: Risk for impaired skin integrity will decrease Outcome: Progressing

## 2020-10-23 NOTE — TOC Progression Note (Addendum)
Transition of Care [facility]) - Progression Note    Patient Details  Name: [name] Browning MRN: [id number] Date of Birth: Apr 30, 1940  Transition of Care [facility]) CM/SW [place], LCSW Phone Number: 10/23/2020, 2:43 PM  Clinical Narrative:   Spoke with patient regarding PT recs. Patient is declining HHPT at this time. Patient states he will return home with his granddaughter who lives with him and can meet his needs. Says she is a Marine scientist.         Expected Discharge Plan and Services                                                 Social Determinants of Health (SDOH) Interventions    Readmission Risk Interventions No flowsheet data found.

## 2020-10-23 NOTE — Progress Notes (Signed)
*  PRELIMINARY RESULTS* Echocardiogram 2D Echocardiogram has been performed.  Sherrie Sport 10/23/2020, 12:33 PM

## 2020-10-23 NOTE — Care Management Important Message (Signed)
Important Message  Patient Details  Name: [name] Browning MRN: [id number] Date of Birth: 05-Oct-1940   Medicare Important Message Given:  Yes     Dannette Barbara 10/23/2020, 2:33 PM

## 2020-10-23 NOTE — Progress Notes (Signed)
Other PROGRESS NOTE    [name] Browning  QQP:[id number] DOB: [birth year] DOA: 10/20/2020 PCP: [place]    Brief Narrative:  [name] Browning is a 80 y.o. male with medical history significant for COPD with chronic hypoxemia on 2 L, paroxysmal atrial fibrillation on Eliquis, hypertension, TIA and recent nephrolithiasis s/p ureteral stent who presents with concerns of weakness and increasing shortness of breath.   Patient recently hospitalized in September for abdominal pain and shortness of breath.  He was found to have COPD with pneumonia.  He also had obstructing left proximal ureteral stone with left-sided hydronephrosis, AKI and underwent left ureteral stent by urology on 9/11.  Subsequently following procedure he developed interstitial edema and hypotension requiring ICU/SDM BiPAP.   Patient reports that since returning home from this admission he has not felt back to baseline.  Has continue progression of shortness of breath and could barely walk down the hall at home.  Has been feeling weak.  Has cough.  Daughter notes temperature of 103F day.  In the ED, he was febrile up to 101.1, hypotensive down to 80s over 50s, tachycardic heart rate of 109, tachypneic RR of 24 and was on nonrebreather with 4L at the time my evaluation.   CBC showed no leukocytosis, hemoglobin stable at 10.8, platelet 147, sodium 139, potassium 3.9, BG of 110. UA shows moderate leukocyte, negative nitrite but no bacteria.  This Chest x-ray with bilateral infiltrate edema versus pneumonia.  CT renal stone search shows bilateral mild pleural effusion and possible malpositioning of urethral stent.   He was started on cefepime and Rocephin.  Also given DuoNeb x2.    10/12 off of Levophed BP stable 1013 feels short of breath today.  No other complaint 10/14 still feels sob. Sounds mildly congested. Clinically appears better. On 3-4 Lnc  Consultants:  PCCM  Procedures:   Antimicrobials:       Subjective: No cp, dizziness, or abd pain.   Objective: Vitals:   10/23/20 0816 10/23/20 1133 10/23/20 1413 10/23/20 1432  BP: 120/78 133/80  120/62  Pulse: 82 95 98 91  Resp: 18 18 18 17   Temp: 97.6 F (36.4 C) (!) 97.5 F (36.4 C)  97.9 F (36.6 C)  TempSrc:    Oral  SpO2: 97% 97% 96% 95%  Weight:      Height:        Intake/Output Summary (Last 24 hours) at 10/23/2020 1541 Last data filed at 10/23/2020 1340 Gross per 24 hour  Intake 1280 ml  Output 2190 ml  Net -910 ml   Filed Weights   10/20/20 1722  Weight: 81.6 kg    Examination: Calm, nontachypnic Decrease bs, no wheezing Rrr s1/s2 no gallops Soft benign +bs No edema  [id number]    Data Reviewed: I have personally reviewed following labs and imaging studies  CBC: Recent Labs  Lab 10/20/20 1733 10/21/20 0632 10/22/20 0624 10/23/20 0926  WBC 9.8 8.2 8.0 9.6  HGB 10.8* 9.2* 9.8* 10.8*  HCT 32.4* 28.8* 30.5* 33.7*  MCV 94.5 98.0 93.8 96.8  PLT 147* 128* 145* 712   Basic Metabolic Panel: Recent Labs  Lab 10/20/20 1733 10/21/20 0632 10/22/20 0624 10/23/20 0450  NA 139 137 137 136  K 3.9 3.7 4.4 4.0  CL 102 103 108 107  CO2 30 26 24 25   GLUCOSE 110* 113* 146* 108*  BUN 22 18 18  31*  CREATININE 1.13 0.96 0.75 1.08  CALCIUM 8.1* 7.7* 8.4* 8.2*   GFR: Estimated  Creatinine Clearance: 64 mL/min (by C-G formula based on SCr of 1.08 mg/dL). Liver Function Tests: Recent Labs  Lab 10/20/20 1733  AST 22  ALT 23  ALKPHOS 85  BILITOT 1.3*  PROT 5.9*  ALBUMIN 2.9*   No results for input(s): LIPASE, AMYLASE in the last 168 hours. No results for input(s): AMMONIA in the last 168 hours. Coagulation Profile: Recent Labs  Lab 10/20/20 2020  INR 1.5*   Cardiac Enzymes: No results for input(s): CKTOTAL, CKMB, CKMBINDEX, TROPONINI in the last 168 hours. BNP (last 3 results) No results for input(s): PROBNP in the last 8760 hours. HbA1C: No results for input(s): HGBA1C in the last 72  hours. CBG: No results for input(s): GLUCAP in the last 168 hours. Lipid Profile: No results for input(s): CHOL, HDL, LDLCALC, TRIG, CHOLHDL, LDLDIRECT in the last 72 hours. Thyroid Function Tests: No results for input(s): TSH, T4TOTAL, FREET4, T3FREE, THYROIDAB in the last 72 hours. Anemia Panel: No results for input(s): VITAMINB12, FOLATE, FERRITIN, TIBC, IRON, RETICCTPCT in the last 72 hours. Sepsis Labs: Recent Labs  Lab 10/20/20 2020 10/21/20 9924 10/21/20 0744 10/22/20 0624 10/23/20 0450  PROCALCITON  --  <0.10  --  <0.10 <0.10  LATICACIDVEN 1.0 1.8 1.7  --   --     Recent Results (from the past 240 hour(s))  Resp Panel by RT-PCR (Flu A&B, Covid) Nasopharyngeal Swab     Status: None   Collection Time: 10/20/20  5:33 PM   Specimen: Nasopharyngeal Swab; Nasopharyngeal(NP) swabs in vial transport medium  Result Value Ref Range Status   SARS Coronavirus 2 by RT PCR NEGATIVE NEGATIVE Final    Comment: (NOTE) SARS-CoV-2 target nucleic acids are NOT DETECTED.  The SARS-CoV-2 RNA is generally detectable in upper respiratory specimens during the acute phase of infection. The lowest concentration of SARS-CoV-2 viral copies this assay can detect is 138 copies/mL. A negative result does not preclude SARS-Cov-2 infection and should not be used as the sole basis for treatment or other patient management decisions. A negative result may occur with  improper specimen collection/handling, submission of specimen other than nasopharyngeal swab, presence of viral mutation(s) within the areas targeted by this assay, and inadequate number of viral copies(<138 copies/mL). A negative result must be combined with clinical observations, patient history, and epidemiological information. The expected result is Negative.  Fact Sheet for Patients:  EntrepreneurPulse.com.au  Fact Sheet for Healthcare Providers:  IncredibleEmployment.be  This test is no t yet  approved or cleared by the [place] FDA and  has been authorized for detection and/or diagnosis of SARS-CoV-2 by FDA under an Emergency Use Authorization (EUA). This EUA will remain  in effect (meaning this test can be used) for the duration of the COVID-19 declaration under Section 564(b)(1) of the Act, 21 U.S.C.section 360bbb-3(b)(1), unless the authorization is terminated  or revoked sooner.       Influenza A by PCR NEGATIVE NEGATIVE Final   Influenza B by PCR NEGATIVE NEGATIVE Final    Comment: (NOTE) The Xpert Xpress SARS-CoV-2/FLU/RSV plus assay is intended as an aid in the diagnosis of influenza from Nasopharyngeal swab specimens and should not be used as a sole basis for treatment. Nasal washings and aspirates are unacceptable for Xpert Xpress SARS-CoV-2/FLU/RSV testing.  Fact Sheet for Patients: EntrepreneurPulse.com.au  Fact Sheet for Healthcare Providers: IncredibleEmployment.be  This test is not yet approved or cleared by the [place] FDA and has been authorized for detection and/or diagnosis of SARS-CoV-2 by FDA under an Emergency Use  Authorization (EUA). This EUA will remain in effect (meaning this test can be used) for the duration of the COVID-19 declaration under Section 564(b)(1) of the Act, 21 U.S.C. section 360bbb-3(b)(1), unless the authorization is terminated or revoked.  Performed at  [facility], [address] [place], [place] [address]   Blood Culture (routine x 2)     Status: None (Preliminary result)   Collection Time: 10/20/20  5:58 PM   Specimen: BLOOD  Result Value Ref Range Status   Specimen Description BLOOD BLOOD LEFT FOREARM  Final   Special Requests   Final    BOTTLES DRAWN AEROBIC AND ANAEROBIC Blood Culture results may not be optimal due to an excessive volume of blood received in culture bottles   Culture   Final    NO GROWTH 3 DAYS Performed at [facility], [address] [place], [place] [address]    Report Status PENDING  Incomplete  Blood Culture (routine x 2)     Status: None (Preliminary result)   Collection Time: 10/20/20  5:58 PM   Specimen: BLOOD  Result Value Ref Range Status   Specimen Description BLOOD RIGHT ANTECUBITAL  Final   Special Requests   Final    BOTTLES DRAWN AEROBIC AND ANAEROBIC Blood Culture results may not be optimal due to an excessive volume of blood received in culture bottles   Culture   Final    NO GROWTH 3 DAYS Performed at [facility], [address] [place], [place] [address]    Report Status PENDING  Incomplete  Urine Culture     Status: Abnormal (Preliminary result)   Collection Time: 10/20/20  8:20 PM   Specimen: Urine, Clean Catch  Result Value Ref Range Status   Specimen Description   Final    URINE, CLEAN CATCH Performed at [facility], [address], [place], [place] [address]    Special Requests   Final    NONE Performed at [facility], [address], [place], [place] [address]    Culture (A)  Final    >=100,000 COLONIES/mL STAPHYLOCOCCUS HAEMOLYTICUS >=100,000 COLONIES/mL ENTEROCOCCUS FAECIUM SUSCEPTIBILITIES TO FOLLOW Performed at [facility], [place] [address] [place], [place] [address]    Report Status PENDING  Incomplete   Organism ID, Bacteria STAPHYLOCOCCUS HAEMOLYTICUS (A)  Final      Susceptibility   Staphylococcus haemolyticus - MIC*    CIPROFLOXACIN >=8 RESISTANT Resistant     GENTAMICIN >=16 RESISTANT Resistant     NITROFURANTOIN <=16 SENSITIVE Sensitive     OXACILLIN >=4 RESISTANT Resistant     TETRACYCLINE 2 SENSITIVE Sensitive     VANCOMYCIN 1 SENSITIVE Sensitive     TRIMETH/SULFA >=320 RESISTANT Resistant     CLINDAMYCIN >=8 RESISTANT Resistant     RIFAMPIN >=32 RESISTANT Resistant     Inducible Clindamycin NEGATIVE Sensitive     * >=100,000 COLONIES/mL STAPHYLOCOCCUS HAEMOLYTICUS  Expectorated Sputum Assessment w Gram Stain,  Rflx to Resp Cult     Status: None   Collection Time: 10/22/20  9:10 AM   Specimen: Expectorated Sputum  Result Value Ref Range Status   Specimen Description EXPECTORATED SPUTUM  Final   Special Requests NONE  Final   Sputum evaluation   Final    THIS SPECIMEN IS ACCEPTABLE FOR SPUTUM CULTURE Performed at [facility], [address] [place], [place] [address]    Report Status 10/22/2020 FINAL  Final  Culture, Respiratory w Gram Stain  Status: None (Preliminary result)   Collection Time: 10/22/20  9:10 AM  Result Value Ref Range Status   Specimen Description   Final    EXPECTORATED SPUTUM Performed at [facility], [address] [place], [place] [address]    Special Requests   Final    NONE Reflexed from [id number] Performed at [facility], [address] [place], [place] [address]    Gram Stain   Final    MODERATE WBC PRESENT,BOTH PMN AND MONONUCLEAR RARE GRAM NEGATIVE RODS RARE GRAM POSITIVE COCCI    Culture   Final    CULTURE REINCUBATED FOR BETTER GROWTH Performed at [facility], [place] [address], [place], [place] [address]    Report Status PENDING  Incomplete         Radiology Studies: No results found.      Scheduled Meds:  apixaban  5 mg Oral BID   aspirin EC  81 mg Oral QPC supper   atorvastatin  40 mg Oral QPC supper   budesonide (PULMICORT) nebulizer solution  0.25 mg Nebulization BID   famotidine  20 mg Oral Daily   ferrous gluconate  324 mg Oral QPC supper   guaiFENesin  600 mg Oral BID   ipratropium-albuterol  3 mL Nebulization TID   loratadine  10 mg Oral Daily   montelukast  10 mg Oral QHS   pantoprazole  40 mg Oral QPC supper   predniSONE  40 mg Oral Q breakfast   tamsulosin  0.4 mg Oral Daily   Continuous Infusions:  sodium chloride 20 mL/hr at 10/22/20 3419   azithromycin     ceFEPime (MAXIPIME) IV 2 g (10/23/20 1203)   vancomycin      Assessment & Plan:   Principal Problem:   Acute on  chronic respiratory failure with hypoxia (HCC) Active Problems:   History of TIA (transient ischemic attack)   Hyperlipidemia   AF (paroxysmal atrial fibrillation) ([place])   Urinary tract obstruction by kidney stone   Community acquired pneumonia   Sepsis ([place])   Hypotension   Septic shock ([place])   Acute on chronic hypoxemic respiratory failure secondary to community-acquired pneumonia versus acute diastolic HF - Patient baseline on 2 L.  He was discharged on this after COPD exacerbation in September.  Now presenting with tachypnea with respiratory rate of 24, hypoxic with ambulation and exertion requiring up to 4 L  - Previously treated for pneumonia in September as well with IV Rocephin and azithromycin.  Continue IV cefepime and azithromycin started in the ED 10/12 continue iv abx 10/13 dc iv steroid, change to po prednisone 40mg  qd start in am. Will give iv lasix 20mg  x1 appears to be volume overloaded mildly  Bnp elevated 12/14 clinically improving. Continue 02 supplement keeping 02>92%. F/u echo-pending  Septic shock secondary to community-acquired pneumonia versus and UTI UCX + staph. Haemol. And E. Enteroc.face Off levophed on 10/12.  10/14 bcx TD negative Continue iv abx      UTI +ucx for staph haemolyticus and enteroco. faecium Will continue with cefepime, and vanco    Paroxysmal atrial fibrillation Continue Eliquis      Recent obstructing left ureteral stone with left hydronephrosis - Creatinine currently stable - Patient had left ureteral stent placed on 9/11 by Dr. Abner Greenspan - 10/7-underwent ureteroscopy, lithotripsy  -CT renal stone shows possible distal migration of the ureteral stent.  Will need to discuss with urology in the morning but doubt any GU infection needing urgent intervention since  he has negative UA, no GI symptoms and benign abdominal exam. -continue Tamsulosin 10/12- will consult urology for malposition of stent 10/13-urologist input was  appreciated.  His urethral stent was removed.  Will need to follow-up outpatient with urology in 6 months with KUB prior for stone monitoring    History of TIA Continue aspirin   Hyperlipidemia -continues statin   DVT prophylaxis: Eliquis Code Status: Full Family Communication: None at bedside Disposition Plan:  Status is: Inpatient  Remains inpatient appropriate because:Inpatient level of care appropriate due to severity of illness  Dispo: The patient is from: Home              Anticipated d/c is to: Home              Patient currently is not medically stable to d/c.   Difficult to place patient No            LOS: 3 days   Time spent: 35 minutes with more than 50% on [place], MD [facility] Pager [phone]  If 7PM-7AM, please contact night-coverage 10/23/2020, 3:41 PM

## 2020-10-23 NOTE — Evaluation (Signed)
Occupational Therapy Evaluation Patient Details Name: [name] Browning MRN: [id number] DOB: 02-Nov-1940 Today's Date: 10/23/2020   History of Present Illness 80 y.o. male presenting to ED with concerns of weakness and increasing shortness of breath. Medical history significant for COPD with chronic hypoxemia on 2 L, paroxysmal atrial fibrillation on Eliquis, hypertension, TIA and recent nephrolithiasis s/p ureteral stent.   Clinical Impression   Pt seen for OT evaluation this date in setting of acute hospitalization d/t SOB. Pt reports being INDEP at baseline for self care, but endorses difficulty with ADLs that require lifting such as yard work or bringing in groceries d/t SOB. States that he lives with grand-dtr in mobile home with 3 STE and b/l railing. States that he is able to drive and run errands, but only walk short distances and usually titrates up to 3Lnc for errands. Pt presents this date with decreased fxl activity tolerance. He requires increased time and SUPV to come to EOB sitting with HOB elevated and use of bed rails. He demos F static sitting balance. Pt requires hand held assist to come to standing with CGA. He demos F static standing balance and P balance with fxl mobility to take ~4-5 steps from bed to chair. Pt left with all needs met and in reach. Noted saturation decreased from 96 to 84% with transition to sitting on 3Lnc. OT increases to 4L and pt is able to recover to 90% with ~30 secs to 1 min rest. OT updates RN and MD. Will continue to follow acutely. Anticipate he may require HHOT to encourage energy conservation modifications in the home environment.      Recommendations for follow up therapy are one component of a multi-disciplinary discharge planning process, led by the attending physician.  Recommendations may be updated based on patient status, additional functional criteria and insurance authorization.   Follow Up Recommendations  Home health OT    Equipment  Recommendations  3 in 1 bedside commode;Tub/shower seat    Recommendations for Other Services       Precautions / Restrictions Precautions Precautions: Fall Restrictions Weight Bearing Restrictions: No      Mobility Bed Mobility Overal bed mobility: Needs Assistance Bed Mobility: Supine to Sit     Supine to sit: Supervision;HOB elevated     General bed mobility comments: increased time, use of bed rails, O2 drops to 84% when on 3Lnc, increased LPM to 4 and pt requires ~30 second seated rest break to recover to 90%    Transfers Overall transfer level: Needs assistance Equipment used: 1 person hand held assist Transfers: Sit to/from Stand Sit to Stand: Min guard         General transfer comment: increased time, but genreally good control. Increased RR, O2 to 85% with STS, able to recover on the 4L once sitting in chair    Balance Overall balance assessment: Needs assistance Sitting-balance support: No upper extremity supported;Feet supported Sitting balance-Leahy Scale: Good     Standing balance support: Single extremity supported;During functional activity Standing balance-Leahy Scale: Poor Standing balance comment: CGA as well as at least unilateral support to sutsain small amount of fxl mobility from bed to chair                           ADL either performed or assessed with clinical judgement   ADL  General ADL Comments: Pt able to perform seated UB ADLs with INDEP, requires MIN A for LB ADLs mostly d/t increased energy/O2 demand to bend at the waist.     Vision Patient Visual Report: No change from baseline       Perception     Praxis      Pertinent Vitals/Pain Pain Assessment: No/denies pain     Hand Dominance Right   Extremity/Trunk Assessment Upper Extremity Assessment Upper Extremity Assessment: Generalized weakness;Overall WFL for tasks assessed (ROM WFL, MMT grossly  4-/5)   Lower Extremity Assessment Lower Extremity Assessment: Generalized weakness;Overall WFL for tasks assessed (ROM WFL, MMT grossly 4-/5)       Communication Communication Communication: No difficulties   Cognition Arousal/Alertness: Awake/alert Behavior During Therapy: WFL for tasks assessed/performed;Flat affect Overall Cognitive Status: Within Functional Limits for tasks assessed                                 General Comments: A&Ox4   General Comments       Exercises Other Exercises Other Exercises: OT engages pt in ed re: role of OT as well as bed mobility and transfer to chair. OT introduces topic of energy conservation, but pt will require ongoing education.   Shoulder Instructions      Home Living Family/patient expects to be discharged to:: Private residence Living Arrangements: Other relatives (granddaughter) Available Help at Discharge: Family;Available 24 hours/day Type of Home: Mobile home Home Access: Stairs to enter Entrance Stairs-Number of Steps: 3 Entrance Stairs-Rails: Right;Left;Can reach both Home Layout: One level     Bathroom Shower/Tub: Walk-in shower   Bathroom Toilet: Handicapped height     Home Equipment: Shower seat;Walker - 2 wheels;Cane - single point   Additional Comments: does not use AD at baseline, Home O2 2-3L      Prior Functioning/Environment Level of Independence: Independent        Comments: Ambulates limited distances within home and to mailbox without an AD. Ind with ADLs/IADLs. +driving. Reports no falls.        OT Problem List: Decreased strength;Decreased activity tolerance;Cardiopulmonary status limiting activity      OT Treatment/Interventions: Self-care/ADL training;Therapeutic exercise;DME and/or AE instruction;Therapeutic activities;Energy conservation    OT Goals(Current goals can be found in the care plan section) Acute Rehab OT Goals Patient Stated Goal: to walk longer distances OT  Goal Formulation: With patient Time For Goal Achievement: 11/06/20 Potential to Achieve Goals: Good  OT Frequency: Min 1X/week   Barriers to D/C:            Co-evaluation              AM-PAC OT "6 Clicks" Daily Activity     Outcome Measure Help from another person eating meals?: None Help from another person taking care of personal grooming?: None Help from another person toileting, which includes using toliet, bedpan, or urinal?: A Little Help from another person bathing (including washing, rinsing, drying)?: A Little Help from another person to put on and taking off regular upper body clothing?: None Help from another person to put on and taking off regular lower body clothing?: A Little 6 Click Score: 21   End of Session Equipment Utilized During Treatment: Gait belt;Oxygen Nurse Communication: Mobility status  Activity Tolerance: Patient tolerated treatment well Patient left: in chair;with call bell/phone within reach  OT Visit Diagnosis: Muscle weakness (generalized) (M62.81)                  Time: 1047-1101 OT Time Calculation (min): 14 min Charges:  OT General Charges $OT Visit: 1 Visit OT Evaluation $OT Eval Moderate Complexity: 1 Mod   , MS, OTR/L ascom [phone] 10/23/20, 4:32 PM  

## 2020-10-23 NOTE — Evaluation (Signed)
Physical Therapy Evaluation Patient Details Name: [name] Browning MRN: [id number] DOB: 1940-08-18 Today's Date: 10/23/2020  History of Present Illness  80 y.o. male presenting to ED with concerns of weakness and increasing shortness of breath. Medical history significant for COPD with chronic hypoxemia on 2 L, paroxysmal atrial fibrillation on Eliquis, hypertension, TIA and recent nephrolithiasis s/p ureteral stent.   Clinical Impression  Pt received sitting in recliner, daughter in room, agreeable to therapy. When discussing PLOF/discharge recs, daughter does state family is available 24/7. Bed mobility was not assessed - pt in recliner at beginning and end of session. Pt presents with overall weakness globally however strength is WFL. Ambulation distance was limited by poor muscular endurance and low oxygen sats, unable to recover sats in standing. Foot clearance and safety with gait did decline with fatigue in final 55ft. Assessed gait with HHA as pt typically does not use AD - PT would rec using RW at this time for improved stability in standing. Pt utilized 4L O2 via [place] with all mobility; he required ~2 minutes to recover to >90% in sitting with VC to inhale through nose. Would benefit from skilled PT to address above deficits and promote optimal return to PLOF.      Recommendations for follow up therapy are one component of a multi-disciplinary discharge planning process, led by the attending physician.  Recommendations may be updated based on patient status, additional functional criteria and insurance authorization.  Follow Up Recommendations Home health PT;Supervision/Assistance - 24 hour    Equipment Recommendations  None recommended by PT    Recommendations for Other Services       Precautions / Restrictions Precautions Precautions: Fall Restrictions Weight Bearing Restrictions: No      Mobility  Bed Mobility               General bed mobility comments: not observed -  pt in recliner and beginning and end of session    Transfers Overall transfer level: Needs assistance Equipment used: 1 person hand held assist Transfers: Sit to/from Stand Sit to Stand: Min guard         General transfer comment: CGA to steady, x2 reps, SOB upon rising  Ambulation/Gait Ambulation/Gait assistance: Min guard Gait Distance (Feet): 90 Feet Assistive device: 1 person hand held assist Gait Pattern/deviations: Step-through pattern;Decreased step length - right;Decreased step length - left;Decreased stride length;Narrow base of support Gait velocity: decreased   General Gait Details: CGA via HHA with occasional decreased foot clearance bilaterally leading to instability, especially with fatigue. PT indicating need to turn around. SpO2 monitored - 85% lowest reading on 4L.  Stairs            Wheelchair Mobility    Modified Rankin (Stroke Patients Only)       Balance Overall balance assessment: Needs assistance Sitting-balance support: No upper extremity supported;Feet supported Sitting balance-Leahy Scale: Fair     Standing balance support: Single extremity supported;During functional activity Standing balance-Leahy Scale: Poor Standing balance comment: required CGA during STS and ambulation                             Pertinent Vitals/Pain Pain Assessment: No/denies pain    Home Living Family/patient expects to be discharged to:: Private residence Living Arrangements: Other relatives Advertising account executive) Available Help at Discharge: Family;Available 24 hours/day Type of Home: Mobile home Home Access: Stairs to enter Entrance Stairs-Rails: Right;Left;Can reach both Entrance Stairs-Number of Steps: 3 Home Layout:  One level Home Equipment: Clinical cytogeneticist - 2 wheels;Cane - single point Additional Comments: does not use AD at baseline    Prior Function Level of Independence: Independent         Comments: Ambulates limited distances  within home and to mailbox without an AD. Ind with ADLs/IADLs. Reports no falls.     Hand Dominance   Dominant Hand: Right    Extremity/Trunk Assessment   Upper Extremity Assessment Upper Extremity Assessment: Generalized weakness;Overall [facility] for tasks assessed    Lower Extremity Assessment Lower Extremity Assessment: Generalized weakness;Overall WFL for tasks assessed (4/5 general BLE)       Communication   Communication: No difficulties  Cognition Arousal/Alertness: Awake/alert Behavior During Therapy: WFL for tasks assessed/performed;Flat affect Overall Cognitive Status: Within Functional Limits for tasks assessed                                 General Comments: A&Ox4      General Comments      Exercises     Assessment/Plan    PT Assessment Patient needs continued PT services  PT Problem List Decreased strength;Decreased mobility;Decreased safety awareness;Decreased activity tolerance;Decreased balance;Cardiopulmonary status limiting activity       PT Treatment Interventions DME instruction;Therapeutic activities;Gait training;Therapeutic exercise;Patient/family education;Stair training;Balance training;Functional mobility training;Neuromuscular re-education    PT Goals (Current goals can be found in the Care Plan section)  Acute Rehab PT Goals Patient Stated Goal: to walk longer distances PT Goal Formulation: With patient/family Time For Goal Achievement: 11/06/20 Potential to Achieve Goals: Fair    Frequency Min 2X/week   Barriers to discharge        Co-evaluation               AM-PAC PT "6 Clicks" Mobility  Outcome Measure Help needed turning from your back to your side while in a flat bed without using bedrails?: A Little Help needed moving from lying on your back to sitting on the side of a flat bed without using bedrails?: A Little Help needed moving to and from a bed to a chair (including a wheelchair)?: A Little Help  needed standing up from a chair using your arms (e.g., wheelchair or bedside chair)?: A Little Help needed to walk in hospital room?: A Little Help needed climbing 3-5 steps with a railing? : A Little 6 Click Score: 18    End of Session Equipment Utilized During Treatment: Gait belt;Oxygen Activity Tolerance: Patient limited by fatigue;Treatment limited secondary to medical complications (Comment) (desaturation) Patient left: in chair;with call bell/phone within reach;with family/visitor present Nurse Communication: Mobility status;Precautions PT Visit Diagnosis: Unsteadiness on feet (R26.81);Other abnormalities of gait and mobility (R26.89);Muscle weakness (generalized) (M62.81);Difficulty in walking, not elsewhere classified (R26.2)    Time: 1120-1200 PT Time Calculation (min) (ACUTE ONLY): 40 min   Charges:   PT Evaluation $PT Eval Moderate Complexity: 1 Mod PT Treatments $Gait Training: 8-22 mins $Therapeutic Activity: 8-22 mins        Patrina Levering PT, DPT 10/23/20 12:51 PM [phone]

## 2020-10-23 NOTE — Consult Note (Signed)
Pharmacy Antibiotic Note  [name] Browning is a 80 y.o. male admitted on 10/20/2020 with sepsis.  PMH includes COPD w/chronic hypoxemia on 2L, afib on eliquis, HTN, CAD, HLD, TIA. Pt is currently on Day 4 of  cefepime and azithromycin and day 2 of vancomycin. Pt presented febrile (Tmax 101.1) now afebrile. Chest Xray impression positive for increasing bilateral pulmonary infitrates consistent with edema or pneumonia. Strep and Legionella Ag negative. Bcx X 2: NG x 3 days. UCx positive for >/= 100,000 colonies/mL for staph haemolyticus and enterococcus faecium.  Sensitive to clindamycin, nitrofurantoin, tetracycline and vancomycin. Pharmacy has been consulted for Vancomycin and cefepime dosing for sepsis due to UTI/PNA.   Vancomycin 1750 mg x 1 (10/13). Azithromycin 500 mg  x 1 (10/11) Cefepime 2 g q8h x 1 (10/11)   Plan: Vancomycin 1000mg  q12h was ordered, will adjust to Vancomycin 1750 mg q24h due to Scr trending up.  Goal AUC: 400 - 550  AUC: 517.7  Css Max: 39.8 Css Min: 11.2 Vd 0.72 Wt: 81.6 kg  Scr: 1.08   Continue to monitor renal function and adjust accordingly. Plan to obtain Vancomycin levels after the 4th or 5th dose. Follow up with length of therapy.   Continue cefepime 2 g IV q8h.  Continue to monitor CrCl and adjust if CrCl <15ml/min, to Cefepime 2 g IV q12h. Plan to continue for 5 days.   Continue Azithromycin 500 mg IV q24h. Plan to continue for 5 days.    Height: 6\' 2"  (188 cm) Weight: 81.6 kg (180 lb) IBW/kg (Calculated) : 82.2  Temp (24hrs), Avg:97.7 F (36.5 C), Min:97.5 F (36.4 C), Max:98.2 F (36.8 C)  Recent Labs  Lab 10/20/20 1733 10/20/20 2020 10/21/20 0947 10/21/20 0744 10/22/20 0624 10/23/20 0450 10/23/20 0926  WBC 9.8  --  8.2  --  8.0  --  9.6  CREATININE 1.13  --  0.96  --  0.75 1.08  --   LATICACIDVEN  --  1.0 1.8 1.7  --   --   --     Estimated Creatinine Clearance: 64 mL/min (by C-G formula based on SCr of 1.08 mg/dL).    Allergies   Allergen Reactions   Hydromorphone     Hallucination Pt reports he doesn't know about this    Antimicrobials this admission: Azithromycin 10/11 >>  Cefepime  10/11 >>  Vancomycin 1750 mg x 1 dose ( 10/13), Vancomycin (10/14>>  Dose adjustments this admission: Vancomycin 1000 q12h > Vancomycin 1750 q24h   Microbiology results: 10/11 BCx: Ng x 3 days  10/11 UCx:  >100,000 colonies/mL positive staphylococcus haemolyticus and enterococcus faecium  10/13 Sputum: WBC, rare gram negative rods, rare gram positive cocci   Thank you for allowing pharmacy to be a part of this patient's care.  [place] Seaver Machia, [place] PharmD Candidate 23'  10/23/2020 1:56 PM

## 2020-10-24 DIAGNOSIS — J9621 Acute and chronic respiratory failure with hypoxia: Secondary | ICD-10-CM | POA: Diagnosis not present

## 2020-10-24 LAB — URINE CULTURE: Culture: >=100000 — AB

## 2020-10-24 LAB — CULTURE, RESPIRATORY W GRAM STAIN: Culture: NORMAL

## 2020-10-24 MED ORDER — METOPROLOL TARTRATE 25 MG PO TABS
12.5000 mg | ORAL_TABLET | Freq: Four times a day (QID) | ORAL | Status: DC
  Administered 2020-10-24 – 2020-10-25 (×4): 12.5 mg via ORAL
  Filled 2020-10-24 (×4): qty 1

## 2020-10-24 MED ORDER — METHYLPREDNISOLONE SODIUM SUCC 40 MG IJ SOLR
40.0000 mg | Freq: Two times a day (BID) | INTRAMUSCULAR | Status: DC
  Administered 2020-10-24 – 2020-10-26 (×4): 40 mg via INTRAVENOUS
  Filled 2020-10-24 (×4): qty 1

## 2020-10-24 MED ORDER — METOPROLOL TARTRATE 25 MG PO TABS
12.5000 mg | ORAL_TABLET | Freq: Four times a day (QID) | ORAL | Status: DC
  Administered 2020-10-24: 12.5 mg via ORAL
  Filled 2020-10-24: qty 1

## 2020-10-24 MED ORDER — FUROSEMIDE 10 MG/ML IJ SOLN
20.0000 mg | Freq: Once | INTRAMUSCULAR | Status: AC
  Administered 2020-10-24: 20 mg via INTRAVENOUS
  Filled 2020-10-24: qty 2

## 2020-10-24 NOTE — Progress Notes (Signed)
Mobility Specialist - Progress Note   10/24/20 1541  Mobility  Activity Ambulated in hall  Level of Assistance Standby assist, set-up cues, supervision of patient - no hands on  Assistive Device Front wheel walker  Distance Ambulated (ft) 60 ft  Mobility Ambulated with assistance in hallway  Mobility Response Tolerated well  Mobility performed by Mobility specialist  $Mobility charge 1 Mobility    Pre-mobility: 102 HR, 92% SpO2 During mobility: 137 HR, 90% SpO2 Post-mobility: 93 HR, 92% SpO2   Pt utilizing 2L on arrival. Ambulated on 4L with supervision and RW. Knee buckling noted x1 during ambulation. Pt does voice feeling weak in LE, limiting further activity. Pt returned to recliner with alarm set. Back on 2L.   Kathee Delton Mobility Specialist 10/24/20, 3:45 PM

## 2020-10-24 NOTE — Progress Notes (Signed)
Other PROGRESS NOTE    [name] Browning  MHD:[id number] DOB: Mar 06, 1940 DOA: 10/20/2020 PCP: [place]    Brief Narrative:  [name] Browning is a 80 y.o. male with medical history significant for COPD with chronic hypoxemia on 2 L, paroxysmal atrial fibrillation on Eliquis, hypertension, TIA and recent nephrolithiasis s/p ureteral stent who presents with concerns of weakness and increasing shortness of breath.   Patient recently hospitalized in September for abdominal pain and shortness of breath.  He was found to have COPD with pneumonia.  He also had obstructing left proximal ureteral stone with left-sided hydronephrosis, AKI and underwent left ureteral stent by urology on 9/11.  Subsequently following procedure he developed interstitial edema and hypotension requiring ICU/SDM BiPAP.   Patient reports that since returning home from this admission he has not felt back to baseline.  Has continue progression of shortness of breath and could barely walk down the hall at home.  Has been feeling weak.  Has cough.  Daughter notes temperature of 103F day.  In the ED, he was febrile up to 101.1, hypotensive down to 80s over 50s, tachycardic heart rate of 109, tachypneic RR of 24 and was on nonrebreather with 4L at the time my evaluation.   CBC showed no leukocytosis, hemoglobin stable at 10.8, platelet 147, sodium 139, potassium 3.9, BG of 110. UA shows moderate leukocyte, negative nitrite but no bacteria.  This Chest x-ray with bilateral infiltrate edema versus pneumonia.  CT renal stone search shows bilateral mild pleural effusion and possible malpositioning of urethral stent.   He was started on cefepime and Rocephin.  Also given DuoNeb x2.    10/12 off of Levophed BP stable 1013 feels short of breath today.  No other complaint 10/14 still feels sob. Sounds mildly congested. Clinically appears better. On 3-4 Lnc 10/15 still with sob, but little better than before. Tele with afib rvr heart  rate 130s.  Consultants:  PCCM  Procedures:   Antimicrobials:      Subjective: No chest pain or dizziness  Objective: Vitals:   10/23/20 2006 10/24/20 0007 10/24/20 0433 10/24/20 0748  BP:  117/63 118/81 132/83  Pulse:  87 89 85  Resp:  17 18 17   Temp:  97.7 F (36.5 C) 97.9 F (36.6 C) 97.8 F (36.6 C)  TempSrc:  Oral Oral   SpO2: 97% 96% 94% 94%  Weight:      Height:        Intake/Output Summary (Last 24 hours) at 10/24/2020 0841 Last data filed at 10/24/2020 0436 Gross per 24 hour  Intake 2533.53 ml  Output 1750 ml  Net 783.53 ml   Filed Weights   10/20/20 1722  Weight: 81.6 kg    Examination: Calm, NAD Mild expiratory forced wheezing no rales decreased breath sounds at bases Irregular S1-S2 no gallops Soft benign positive bowel sounds No edema aaxox3   Data Reviewed: I have personally reviewed following labs and imaging studies  CBC: Recent Labs  Lab 10/20/20 1733 10/21/20 0632 10/22/20 0624 10/23/20 0926  WBC 9.8 8.2 8.0 9.6  HGB 10.8* 9.2* 9.8* 10.8*  HCT 32.4* 28.8* 30.5* 33.7*  MCV 94.5 98.0 93.8 96.8  PLT 147* 128* 145* 211   Basic Metabolic Panel: Recent Labs  Lab 10/20/20 1733 10/21/20 0632 10/22/20 0624 10/23/20 0450  NA 139 137 137 136  K 3.9 3.7 4.4 4.0  CL 102 103 108 107  CO2 30 26 24 25   GLUCOSE 110* 113* 146* 108*  BUN  22 18 18  31*  CREATININE 1.13 0.96 0.75 1.08  CALCIUM 8.1* 7.7* 8.4* 8.2*   GFR: Estimated Creatinine Clearance: 64 mL/min (by C-G formula based on SCr of 1.08 mg/dL). Liver Function Tests: Recent Labs  Lab 10/20/20 1733  AST 22  ALT 23  ALKPHOS 85  BILITOT 1.3*  PROT 5.9*  ALBUMIN 2.9*   No results for input(s): LIPASE, AMYLASE in the last 168 hours. No results for input(s): AMMONIA in the last 168 hours. Coagulation Profile: Recent Labs  Lab 10/20/20 2020  INR 1.5*   Cardiac Enzymes: No results for input(s): CKTOTAL, CKMB, CKMBINDEX, TROPONINI in the last 168 hours. BNP (last 3  results) No results for input(s): PROBNP in the last 8760 hours. HbA1C: No results for input(s): HGBA1C in the last 72 hours. CBG: No results for input(s): GLUCAP in the last 168 hours. Lipid Profile: No results for input(s): CHOL, HDL, LDLCALC, TRIG, CHOLHDL, LDLDIRECT in the last 72 hours. Thyroid Function Tests: No results for input(s): TSH, T4TOTAL, FREET4, T3FREE, THYROIDAB in the last 72 hours. Anemia Panel: No results for input(s): VITAMINB12, FOLATE, FERRITIN, TIBC, IRON, RETICCTPCT in the last 72 hours. Sepsis Labs: Recent Labs  Lab 10/20/20 2020 10/21/20 7035 10/21/20 0744 10/22/20 0624 10/23/20 0450  PROCALCITON  --  <0.10  --  <0.10 <0.10  LATICACIDVEN 1.0 1.8 1.7  --   --     Recent Results (from the past 240 hour(s))  Resp Panel by RT-PCR (Flu A&B, Covid) Nasopharyngeal Swab     Status: None   Collection Time: 10/20/20  5:33 PM   Specimen: Nasopharyngeal Swab; Nasopharyngeal(NP) swabs in vial transport medium  Result Value Ref Range Status   SARS Coronavirus 2 by RT PCR NEGATIVE NEGATIVE Final    Comment: (NOTE) SARS-CoV-2 target nucleic acids are NOT DETECTED.  The SARS-CoV-2 RNA is generally detectable in upper respiratory specimens during the acute phase of infection. The lowest concentration of SARS-CoV-2 viral copies this assay can detect is 138 copies/mL. A negative result does not preclude SARS-Cov-2 infection and should not be used as the sole basis for treatment or other patient management decisions. A negative result may occur with  improper specimen collection/handling, submission of specimen other than nasopharyngeal swab, presence of viral mutation(s) within the areas targeted by this assay, and inadequate number of viral copies(<138 copies/mL). A negative result must be combined with clinical observations, patient history, and epidemiological information. The expected result is Negative.  Fact Sheet for Patients:   EntrepreneurPulse.com.au  Fact Sheet for Healthcare Providers:  IncredibleEmployment.be  This test is no t yet approved or cleared by the [place] FDA and  has been authorized for detection and/or diagnosis of SARS-CoV-2 by FDA under an Emergency Use Authorization (EUA). This EUA will remain  in effect (meaning this test can be used) for the duration of the COVID-19 declaration under Section 564(b)(1) of the Act, 21 U.S.C.section 360bbb-3(b)(1), unless the authorization is terminated  or revoked sooner.       Influenza A by PCR NEGATIVE NEGATIVE Final   Influenza B by PCR NEGATIVE NEGATIVE Final    Comment: (NOTE) The Xpert Xpress SARS-CoV-2/FLU/RSV plus assay is intended as an aid in the diagnosis of influenza from Nasopharyngeal swab specimens and should not be used as a sole basis for treatment. Nasal washings and aspirates are unacceptable for Xpert Xpress SARS-CoV-2/FLU/RSV testing.  Fact Sheet for Patients: EntrepreneurPulse.com.au  Fact Sheet for Healthcare Providers: IncredibleEmployment.be  This test is not yet approved or cleared by  the [employer] and has been authorized for detection and/or diagnosis of SARS-CoV-2 by FDA under an Emergency Use Authorization (EUA). This EUA will remain in effect (meaning this test can be used) for the duration of the COVID-19 declaration under Section 564(b)(1) of the Act, 21 U.S.C. section 360bbb-3(b)(1), unless the authorization is terminated or revoked.  Performed at [facility], [address], [place], [place] [address]   Blood Culture (routine x 2)     Status: None (Preliminary result)   Collection Time: 10/20/20  5:58 PM   Specimen: BLOOD  Result Value Ref Range Status   Specimen Description BLOOD BLOOD LEFT FOREARM  Final   Special Requests   Final    BOTTLES DRAWN AEROBIC AND ANAEROBIC Blood Culture results may not be  optimal due to an excessive volume of blood received in culture bottles   Culture   Final    NO GROWTH 4 DAYS Performed at [facility], [address], [place], [place] [address]    Report Status PENDING  Incomplete  Blood Culture (routine x 2)     Status: None (Preliminary result)   Collection Time: 10/20/20  5:58 PM   Specimen: BLOOD  Result Value Ref Range Status   Specimen Description BLOOD RIGHT ANTECUBITAL  Final   Special Requests   Final    BOTTLES DRAWN AEROBIC AND ANAEROBIC Blood Culture results may not be optimal due to an excessive volume of blood received in culture bottles   Culture   Final    NO GROWTH 4 DAYS Performed at [facility], [address], [place], [place] [address]    Report Status PENDING  Incomplete  Urine Culture     Status: Abnormal (Preliminary result)   Collection Time: 10/20/20  8:20 PM   Specimen: Urine, Clean Catch  Result Value Ref Range Status   Specimen Description   Final    URINE, CLEAN CATCH Performed at [facility], [address] [place], [place] [address]    Special Requests   Final    NONE Performed at [facility], [address], [place], [place] [address]    Culture (A)  Final    >=100,000 COLONIES/mL STAPHYLOCOCCUS HAEMOLYTICUS >=100,000 COLONIES/mL ENTEROCOCCUS FAECIUM SUSCEPTIBILITIES TO FOLLOW Performed at [facility], [place] [address] [place], [place] [address]    Report Status PENDING  Incomplete   Organism ID, Bacteria STAPHYLOCOCCUS HAEMOLYTICUS (A)  Final      Susceptibility   Staphylococcus haemolyticus - MIC*    CIPROFLOXACIN >=8 RESISTANT Resistant     GENTAMICIN >=16 RESISTANT Resistant     NITROFURANTOIN <=16 SENSITIVE Sensitive     OXACILLIN >=4 RESISTANT Resistant     TETRACYCLINE 2 SENSITIVE Sensitive     VANCOMYCIN 1 SENSITIVE Sensitive     TRIMETH/SULFA >=320 RESISTANT Resistant     CLINDAMYCIN >=8 RESISTANT Resistant     RIFAMPIN >=32 RESISTANT  Resistant     Inducible Clindamycin NEGATIVE Sensitive     * >=100,000 COLONIES/mL STAPHYLOCOCCUS HAEMOLYTICUS  Expectorated Sputum Assessment w Gram Stain, Rflx to Resp Cult     Status: None   Collection Time: 10/22/20  9:10 AM   Specimen: Expectorated Sputum  Result Value Ref Range Status   Specimen Description EXPECTORATED SPUTUM  Final   Special Requests NONE  Final   Sputum evaluation   Final    THIS SPECIMEN IS ACCEPTABLE FOR SPUTUM CULTURE Performed at [facility], [address] [place], [place]  [address]    Report Status 10/22/2020 FINAL  Final  Culture, Respiratory w Gram Stain     Status: None (Preliminary result)   Collection Time: 10/22/20  9:10 AM  Result Value Ref Range Status   Specimen Description   Final    EXPECTORATED SPUTUM Performed at [facility], [address], [place], [place] [address]    Special Requests   Final    NONE Reflexed from [phone] Performed at [facility], [address] [place], [place] [address]    Gram Stain   Final    MODERATE WBC PRESENT,BOTH PMN AND MONONUCLEAR RARE GRAM NEGATIVE RODS RARE GRAM POSITIVE COCCI    Culture   Final    CULTURE REINCUBATED FOR BETTER GROWTH Performed at [facility], [place] [address], [place], [place] [address]    Report Status PENDING  Incomplete         Radiology Studies: DG Chest Port 1 View  Result Date: 10/23/2020 CLINICAL DATA:  Respiratory failure, increasing shortness of breath EXAM: PORTABLE CHEST 1 VIEW COMPARISON:  10/20/2020 FINDINGS: Unchanged mildly enlarged cardiac contour. Redemonstrated diffuse airspace and interstitial opacities bilaterally, which appear largely unchanged compared to the prior exam. No definite pleural effusion. Aortic calcifications. No acute osseous abnormality. IMPRESSION: Unchanged bilateral pulmonary opacities, which could represent edema or infection. Electronically Signed   By: Merilyn Baba M.D.   On: 10/23/2020 17:49    ECHOCARDIOGRAM COMPLETE  Result Date: 10/23/2020    ECHOCARDIOGRAM REPORT   Patient Name:   ALIXANDER RALLIS Date of Exam: 10/23/2020 Medical Rec #:  [id number]     Height:       74.0 in Accession #:    [id number]    Weight:       180.0 lb Date of Birth:  Jul 09, 1940     BSA:          2.078 m Patient Age:    36 years      BP:           133/80 mmHg Patient Gender: M             HR:           95 bpm. Exam Location:  [facility] Procedure: 2D Echo, Cardiac Doppler and Color Doppler Indications:     CHF-acute diastolic F12.19  History:         Patient has prior history of Echocardiogram examinations, most                  recent 04/20/2015. Angina, COPD and TIA; Signs/Symptoms:Syncope.  Sonographer:     Sherrie Sport Referring Phys:  [id number] [place] Diagnosing Phys: Ida Rogue MD  Sonographer Comments: Suboptimal apical window. IMPRESSIONS  1. Left ventricular ejection fraction, by estimation, is 60 to 65%. The left ventricle has normal function. The left ventricle has no regional wall motion abnormalities. Left ventricular diastolic parameters are indeterminate.  2. Right ventricular systolic function is normal. The right ventricular size is normal. There is mildly elevated pulmonary artery systolic pressure.  3. Rhythm is atrial fibrillation  4. Left atrial size was moderately dilated. FINDINGS  Left Ventricle: Left ventricular ejection fraction, by estimation, is 60 to 65%. The left ventricle has normal function. The left ventricle has no regional wall motion abnormalities. The left ventricular internal cavity size was normal in size. There is  no left ventricular hypertrophy. Left ventricular diastolic parameters are indeterminate. Right Ventricle: The right ventricular size is normal. No increase in  right ventricular wall thickness. Right ventricular systolic function is normal. There is mildly elevated pulmonary artery systolic pressure. The tricuspid regurgitant velocity is 2.95  m/s, and with an assumed right  atrial pressure of 5 mmHg, the estimated right ventricular systolic pressure is 84.1 mmHg. Left Atrium: Left atrial size was moderately dilated. Right Atrium: Right atrial size was normal in size. Pericardium: There is no evidence of pericardial effusion. Mitral Valve: The mitral valve is normal in structure. There is mild thickening of the mitral valve leaflet(s). No evidence of mitral valve regurgitation. No evidence of mitral valve stenosis. Tricuspid Valve: The tricuspid valve is normal in structure. Tricuspid valve regurgitation is mild . No evidence of tricuspid stenosis. Aortic Valve: The aortic valve is normal in structure. Aortic valve regurgitation is not visualized. No aortic stenosis is present. Aortic valve mean gradient measures 2.0 mmHg. Aortic valve peak gradient measures 4.7 mmHg. Aortic valve area, by VTI measures 3.04 cm. Pulmonic Valve: The pulmonic valve was normal in structure. Pulmonic valve regurgitation is not visualized. No evidence of pulmonic stenosis. Aorta: The aortic root is normal in size and structure. Venous: The inferior vena cava is normal in size with greater than 50% respiratory variability, suggesting right atrial pressure of 3 mmHg. IAS/Shunts: No atrial level shunt detected by color flow Doppler.  LEFT VENTRICLE PLAX 2D LVIDd:         4.19 cm LVIDs:         2.93 cm LV PW:         1.33 cm LV IVS:        0.95 cm LVOT diam:     2.00 cm LV SV:         55 LV SV Index:   26 LVOT Area:     3.14 cm  RIGHT VENTRICLE RV Basal diam:  4.55 cm RV S prime:     7.51 cm/s TAPSE (M-mode): 3.8 cm LEFT ATRIUM              Index        RIGHT ATRIUM           Index LA diam:        3.50 cm  1.68 cm/m   RA Area:     26.60 cm LA Vol (A2C):   93.4 ml  44.94 ml/m  RA Volume:   96.30 ml  46.34 ml/m LA Vol (A4C):   128.0 ml 61.59 ml/m LA Biplane Vol: 113.0 ml 54.37 ml/m  AORTIC VALVE                    PULMONIC VALVE AV Area (Vmax):    2.83 cm     PV Vmax:        0.96 m/s AV Area (Vmean):    2.84 cm     PV Peak grad:   3.6 mmHg AV Area (VTI):     3.04 cm     RVOT Peak grad: 4 mmHg AV Vmax:           108.00 cm/s AV Vmean:          70.100 cm/s AV VTI:            0.181 m AV Peak Grad:      4.7 mmHg AV Mean Grad:      2.0 mmHg LVOT Vmax:         97.30 cm/s LVOT Vmean:        63.300 cm/s LVOT VTI:  0.175 m LVOT/AV VTI ratio: 0.97  AORTA Ao Root diam: 3.30 cm MITRAL VALVE                TRICUSPID VALVE MV Area (PHT): 4.52 cm     TR Peak grad:   34.8 mmHg MV Decel Time: 168 msec     TR Vmax:        295.00 cm/s MV E velocity: 134.00 cm/s                             SHUNTS                             Systemic VTI:  0.18 m                             Systemic Diam: 2.00 cm Ida Rogue MD Electronically signed by Ida Rogue MD Signature Date/Time: 10/23/2020/5:30:24 PM    Final         Scheduled Meds:  apixaban  5 mg Oral BID   aspirin EC  81 mg Oral QPC supper   atorvastatin  40 mg Oral QPC supper   budesonide (PULMICORT) nebulizer solution  0.25 mg Nebulization BID   famotidine  20 mg Oral Daily   ferrous gluconate  324 mg Oral QPC supper   guaiFENesin  600 mg Oral BID   ipratropium-albuterol  3 mL Nebulization TID   loratadine  10 mg Oral Daily   montelukast  10 mg Oral QHS   pantoprazole  40 mg Oral QPC supper   predniSONE  40 mg Oral Q breakfast   tamsulosin  0.4 mg Oral Daily   Continuous Infusions:  sodium chloride 20 mL/hr at 10/22/20 4235   azithromycin 500 mg (10/23/20 1704)   ceFEPime (MAXIPIME) IV 2 g (10/24/20 0545)   vancomycin 1,750 mg (10/23/20 2237)    Assessment & Plan:   Principal Problem:   Acute on chronic respiratory failure with hypoxia (HCC) Active Problems:   History of TIA (transient ischemic attack)   Hyperlipidemia   AF (paroxysmal atrial fibrillation) (HCC)   Urinary tract obstruction by kidney stone   Community acquired pneumonia   Sepsis ([place])   Hypotension   Septic shock ([place])   Acute on chronic hypoxemic respiratory  failure secondary to community-acquired pneumonia versus acute diastolic HF - Patient baseline on 2 L.  He was discharged on this after COPD exacerbation in September.  Now presenting with tachypnea with respiratory rate of 24, hypoxic with ambulation and exertion requiring up to 4 L  - Previously treated for pneumonia in September as well with IV Rocephin and azithromycin.  Continue IV cefepime and azithromycin started in the ED 10/12 continue iv abx 10/13 dc iv steroid, change to po prednisone 40mg  qd start in am. Will give iv lasix 20mg  x1 appears to be volume overloaded mildly  Bnp elevated 10/15-clinically slowly improving.  Still has some shortness of breath.  Looking at telemetry she he has tachycardia that may be also causing some shortness of breath that he is experiencing.  He appears to be wheezing on exam.  We will switch his p.o. steroids to IV  clinically improving. Continue 02 supplement keeping 02>92%. Echo EF normal Give Lasix 20 mg IV x1 Monitor lites and renal function     Septic shock secondary to community-acquired pneumonia versus and  UTI UCX + staph. Haemol. And E. Enteroc.face Off levophed on 10/12.  10/15 blood cultures today negative Continue IV antibiotic      UTI +ucx for staph haemolyticus and enteroco. faecium 10/15 continue IV antibiotics with cefepime and Vanco   Paroxysmal atrial fibrillation Now with rapid ventricular response Start metoprolol 12.5 mg every 6 with parameters If not controlled will start on Cardizem drip Continue Eliquis       Recent obstructing left ureteral stone with left hydronephrosis - Creatinine currently stable - Patient had left ureteral stent placed on 9/11 by Dr. Abner Greenspan - 10/7-underwent ureteroscopy, lithotripsy  -CT renal stone shows possible distal migration of the ureteral stent.  Will need to discuss with urology in the morning but doubt any GU infection needing urgent intervention since he has negative UA, no  GI symptoms and benign abdominal exam. -continue Tamsulosin 10/12- will consult urology for malposition of stent 10/13-urologist input was appreciated.  His urethral stent was removed.  Will need to follow-up outpatient with urology in 6 months with KUB prior for stone monitoring    History of TIA Continue aspirin   Hyperlipidemia -continues statin   DVT prophylaxis: Eliquis Code Status: Full Family Communication: None at bedside Disposition Plan:  Status is: Inpatient  Remains inpatient appropriate because:Inpatient level of care appropriate due to severity of illness  Dispo: The patient is from: Home              Anticipated d/c is to: Home              Patient currently is not medically stable to d/c.   Difficult to place patient No            LOS: 4 days   Time spent: 35 minutes with more than 50% on [place], MD [facility] Pager [phone]  If 7PM-7AM, please contact night-coverage 10/24/2020, 8:41 AM

## 2020-10-25 DIAGNOSIS — J9621 Acute and chronic respiratory failure with hypoxia: Secondary | ICD-10-CM | POA: Diagnosis not present

## 2020-10-25 LAB — BASIC METABOLIC PANEL
Anion gap: 4 — ABNORMAL LOW (ref 5–15)
BUN: 40 mg/dL — ABNORMAL HIGH (ref 8–23)
CO2: 27 mmol/L (ref 22–32)
Calcium: 8.2 mg/dL — ABNORMAL LOW (ref 8.9–10.3)
Chloride: 106 mmol/L (ref 98–111)
Creatinine, Ser: 0.93 mg/dL (ref 0.61–1.24)
GFR, Estimated: >60 mL/min (ref >60)
Glucose, Bld: 176 mg/dL — ABNORMAL HIGH (ref 70–99)
Potassium: 4.1 mmol/L (ref 3.5–5.1)
Sodium: 137 mmol/L (ref 135–145)

## 2020-10-25 LAB — CULTURE, BLOOD (ROUTINE X 2)
Culture: NO GROWTH
Culture: NO GROWTH

## 2020-10-25 MED ORDER — METOPROLOL SUCCINATE ER 25 MG PO TB24
25.0000 mg | ORAL_TABLET | Freq: Two times a day (BID) | ORAL | Status: DC
  Administered 2020-10-25 – 2020-10-27 (×4): 25 mg via ORAL
  Filled 2020-10-25 (×4): qty 1

## 2020-10-25 MED ORDER — LINEZOLID 600 MG/300ML IV SOLN
600.0000 mg | Freq: Two times a day (BID) | INTRAVENOUS | Status: DC
  Administered 2020-10-25 – 2020-10-30 (×10): 600 mg via INTRAVENOUS
  Filled 2020-10-25 (×12): qty 300

## 2020-10-25 MED ORDER — GUAIFENESIN 100 MG/5ML PO SOLN
10.0000 mL | Freq: Four times a day (QID) | ORAL | Status: DC | PRN
  Filled 2020-10-25 (×2): qty 10

## 2020-10-25 MED ORDER — FUROSEMIDE 10 MG/ML IJ SOLN
20.0000 mg | Freq: Once | INTRAMUSCULAR | Status: AC
  Administered 2020-10-25: 20 mg via INTRAVENOUS
  Filled 2020-10-25: qty 2

## 2020-10-25 MED ORDER — LINEZOLID 600 MG PO TABS: 600.0000 mg | ORAL_TABLET | Freq: Two times a day (BID) | ORAL | Status: DC

## 2020-10-25 NOTE — Progress Notes (Signed)
Other PROGRESS NOTE    [name] Browning  WUX:[id number] DOB: 1940/02/20 DOA: 10/20/2020 PCP: [place]    Brief Narrative:  [name] Browning is a 80 y.o. male with medical history significant for COPD with chronic hypoxemia on 2 L, paroxysmal atrial fibrillation on Eliquis, hypertension, TIA and recent nephrolithiasis s/p ureteral stent who presents with concerns of weakness and increasing shortness of breath.   Patient recently hospitalized in September for abdominal pain and shortness of breath.  He was found to have COPD with pneumonia.  He also had obstructing left proximal ureteral stone with left-sided hydronephrosis, AKI and underwent left ureteral stent by urology on 9/11.  Subsequently following procedure he developed interstitial edema and hypotension requiring ICU/SDM BiPAP.   Patient reports that since returning home from this admission he has not felt back to baseline.  Has continue progression of shortness of breath and could barely walk down the hall at home.  Has been feeling weak.  Has cough.  Daughter notes temperature of 103F day.  In the ED, he was febrile up to 101.1, hypotensive down to 80s over 50s, tachycardic heart rate of 109, tachypneic RR of 24 and was on nonrebreather with 4L at the time my evaluation.   CBC showed no leukocytosis, hemoglobin stable at 10.8, platelet 147, sodium 139, potassium 3.9, BG of 110. UA shows moderate leukocyte, negative nitrite but no bacteria.  This Chest x-ray with bilateral infiltrate edema versus pneumonia.  CT renal stone search shows bilateral mild pleural effusion and possible malpositioning of urethral stent.   He was started on cefepime and Rocephin.  Also given DuoNeb x2.    10/12 off of Levophed BP stable 1013 feels short of breath today.  No other complaint 10/14 still feels sob. Sounds mildly congested. Clinically appears better. On 3-4 Lnc 10/15 still with sob, but little better than before. Tele with afib rvr heart  rate 130s. 10/16 daughter at bedside, who reports father looks much better. Sob improving, +cough.  Per daughter since he had covid he has been having issues.  Tele afib with hr better controlled.  Consultants:  PCCM  Procedures:   Antimicrobials:   Cefepime and vanco  Subjective: No cp, abd pain, dizziness  Objective: Vitals:   10/24/20 2022 10/24/20 2325 10/25/20 0405 10/25/20 0729  BP: 105/69 111/73 135/78 137/76  Pulse: 92 (!) 58 88 78  Resp: 16 16 16 16   Temp: 97.6 F (36.4 C) 97.8 F (36.6 C) 97.7 F (36.5 C) 97.9 F (36.6 C)  TempSrc: Oral Oral Oral Oral  SpO2: 96% 95% 94% 95%  Weight:      Height:        Intake/Output Summary (Last 24 hours) at 10/25/2020 0835 Last data filed at 10/25/2020 0405 Gross per 24 hour  Intake 2000 ml  Output 1225 ml  Net 775 ml   Filed Weights   10/20/20 1722  Weight: 81.6 kg    Examination: Nad, calm +rhonchi, end exp wheezing, no rales Irreg, s1/s2 no gallop Soft benign +bs Decrease edema   Data Reviewed: I have personally reviewed following labs and imaging studies  CBC: Recent Labs  Lab 10/20/20 1733 10/21/20 0632 10/22/20 0624 10/23/20 0926  WBC 9.8 8.2 8.0 9.6  HGB 10.8* 9.2* 9.8* 10.8*  HCT 32.4* 28.8* 30.5* 33.7*  MCV 94.5 98.0 93.8 96.8  PLT 147* 128* 145* 253   Basic Metabolic Panel: Recent Labs  Lab 10/20/20 1733 10/21/20 6644 10/22/20 0347 10/23/20 0450 10/25/20 0543  NA 139 137 137 136 137  K 3.9 3.7 4.4 4.0 4.1  CL 102 103 108 107 106  CO2 30 26 24 25 27   GLUCOSE 110* 113* 146* 108* 176*  BUN 22 18 18  31* 40*  CREATININE 1.13 0.96 0.75 1.08 0.93  CALCIUM 8.1* 7.7* 8.4* 8.2* 8.2*   GFR: Estimated Creatinine Clearance: 74.3 mL/min (by C-G formula based on SCr of 0.93 mg/dL). Liver Function Tests: Recent Labs  Lab 10/20/20 1733  AST 22  ALT 23  ALKPHOS 85  BILITOT 1.3*  PROT 5.9*  ALBUMIN 2.9*   No results for input(s): LIPASE, AMYLASE in the last 168 hours. No results for  input(s): AMMONIA in the last 168 hours. Coagulation Profile: Recent Labs  Lab 10/20/20 2020  INR 1.5*   Cardiac Enzymes: No results for input(s): CKTOTAL, CKMB, CKMBINDEX, TROPONINI in the last 168 hours. BNP (last 3 results) No results for input(s): PROBNP in the last 8760 hours. HbA1C: No results for input(s): HGBA1C in the last 72 hours. CBG: No results for input(s): GLUCAP in the last 168 hours. Lipid Profile: No results for input(s): CHOL, HDL, LDLCALC, TRIG, CHOLHDL, LDLDIRECT in the last 72 hours. Thyroid Function Tests: No results for input(s): TSH, T4TOTAL, FREET4, T3FREE, THYROIDAB in the last 72 hours. Anemia Panel: No results for input(s): VITAMINB12, FOLATE, FERRITIN, TIBC, IRON, RETICCTPCT in the last 72 hours. Sepsis Labs: Recent Labs  Lab 10/20/20 2020 10/21/20 4854 10/21/20 0744 10/22/20 0624 10/23/20 0450  PROCALCITON  --  <0.10  --  <0.10 <0.10  LATICACIDVEN 1.0 1.8 1.7  --   --     Recent Results (from the past 240 hour(s))  Resp Panel by RT-PCR (Flu A&B, Covid) Nasopharyngeal Swab     Status: None   Collection Time: 10/20/20  5:33 PM   Specimen: Nasopharyngeal Swab; Nasopharyngeal(NP) swabs in vial transport medium  Result Value Ref Range Status   SARS Coronavirus 2 by RT PCR NEGATIVE NEGATIVE Final    Comment: (NOTE) SARS-CoV-2 target nucleic acids are NOT DETECTED.  The SARS-CoV-2 RNA is generally detectable in upper respiratory specimens during the acute phase of infection. The lowest concentration of SARS-CoV-2 viral copies this assay can detect is 138 copies/mL. A negative result does not preclude SARS-Cov-2 infection and should not be used as the sole basis for treatment or other patient management decisions. A negative result may occur with  improper specimen collection/handling, submission of specimen other than nasopharyngeal swab, presence of viral mutation(s) within the areas targeted by this assay, and inadequate number of  viral copies(<138 copies/mL). A negative result must be combined with clinical observations, patient history, and epidemiological information. The expected result is Negative.  Fact Sheet for Patients:  EntrepreneurPulse.com.au  Fact Sheet for Healthcare Providers:  IncredibleEmployment.be  This test is no t yet approved or cleared by the [place] FDA and  has been authorized for detection and/or diagnosis of SARS-CoV-2 by FDA under an Emergency Use Authorization (EUA). This EUA will remain  in effect (meaning this test can be used) for the duration of the COVID-19 declaration under Section 564(b)(1) of the Act, 21 U.S.C.section 360bbb-3(b)(1), unless the authorization is terminated  or revoked sooner.       Influenza A by PCR NEGATIVE NEGATIVE Final   Influenza B by PCR NEGATIVE NEGATIVE Final    Comment: (NOTE) The Xpert Xpress SARS-CoV-2/FLU/RSV plus assay is intended as an aid in the diagnosis of influenza from Nasopharyngeal swab specimens and should not be used as  a sole basis for treatment. Nasal washings and aspirates are unacceptable for Xpert Xpress SARS-CoV-2/FLU/RSV testing.  Fact Sheet for Patients: EntrepreneurPulse.com.au  Fact Sheet for Healthcare Providers: IncredibleEmployment.be  This test is not yet approved or cleared by the [place] FDA and has been authorized for detection and/or diagnosis of SARS-CoV-2 by FDA under an Emergency Use Authorization (EUA). This EUA will remain in effect (meaning this test can be used) for the duration of the COVID-19 declaration under Section 564(b)(1) of the Act, 21 U.S.C. section 360bbb-3(b)(1), unless the authorization is terminated or revoked.  Performed at [facility], [address] [place], [place] [address]   Blood Culture (routine x 2)     Status: None   Collection Time: 10/20/20  5:58 PM   Specimen: BLOOD  Result  Value Ref Range Status   Specimen Description BLOOD BLOOD LEFT FOREARM  Final   Special Requests   Final    BOTTLES DRAWN AEROBIC AND ANAEROBIC Blood Culture results may not be optimal due to an excessive volume of blood received in culture bottles   Culture   Final    NO GROWTH 5 DAYS Performed at [facility], [address] [place], [place] [address]    Report Status 10/25/2020 FINAL  Final  Blood Culture (routine x 2)     Status: None   Collection Time: 10/20/20  5:58 PM   Specimen: BLOOD  Result Value Ref Range Status   Specimen Description BLOOD RIGHT ANTECUBITAL  Final   Special Requests   Final    BOTTLES DRAWN AEROBIC AND ANAEROBIC Blood Culture results may not be optimal due to an excessive volume of blood received in culture bottles   Culture   Final    NO GROWTH 5 DAYS Performed at [facility], [address] [place], [place] [address]    Report Status 10/25/2020 FINAL  Final  Urine Culture     Status: Abnormal   Collection Time: 10/20/20  8:20 PM   Specimen: Urine, Clean Catch  Result Value Ref Range Status   Specimen Description   Final    URINE, CLEAN CATCH Performed at [facility], [address] [place], [place] [address]    Special Requests   Final    NONE Performed at [facility], [address] [place], [place] [address]    Culture (A)  Final    >=100,000 COLONIES/mL STAPHYLOCOCCUS HAEMOLYTICUS >=100,000 COLONIES/mL VANCOMYCIN RESISTANT ENTEROCOCCUS    Report Status 10/24/2020 FINAL  Final   Organism ID, Bacteria STAPHYLOCOCCUS HAEMOLYTICUS (A)  Final   Organism ID, Bacteria VANCOMYCIN RESISTANT ENTEROCOCCUS (A)  Final      Susceptibility   Staphylococcus haemolyticus - MIC*    CIPROFLOXACIN >=8 RESISTANT Resistant     GENTAMICIN >=16 RESISTANT Resistant     NITROFURANTOIN <=16 SENSITIVE Sensitive     OXACILLIN >=4 RESISTANT Resistant     TETRACYCLINE 2 SENSITIVE Sensitive     VANCOMYCIN 1 SENSITIVE  Sensitive     TRIMETH/SULFA >=320 RESISTANT Resistant     CLINDAMYCIN >=8 RESISTANT Resistant     RIFAMPIN >=32 RESISTANT Resistant     Inducible Clindamycin NEGATIVE Sensitive     * >=100,000 COLONIES/mL STAPHYLOCOCCUS HAEMOLYTICUS   Vancomycin resistant enterococcus - MIC*    AMPICILLIN >=32 RESISTANT Resistant     NITROFURANTOIN 64 INTERMEDIATE Intermediate     VANCOMYCIN >=32 RESISTANT Resistant     LINEZOLID 2 SENSITIVE Sensitive     * >=100,000 COLONIES/mL VANCOMYCIN RESISTANT  ENTEROCOCCUS  Expectorated Sputum Assessment w Gram Stain, Rflx to Resp Cult     Status: None   Collection Time: 10/22/20  9:10 AM   Specimen: Expectorated Sputum  Result Value Ref Range Status   Specimen Description EXPECTORATED SPUTUM  Final   Special Requests NONE  Final   Sputum evaluation   Final    THIS SPECIMEN IS ACCEPTABLE FOR SPUTUM CULTURE Performed at [facility], [address], [place], [place] [address]    Report Status 10/22/2020 FINAL  Final  Culture, Respiratory w Gram Stain     Status: None   Collection Time: 10/22/20  9:10 AM  Result Value Ref Range Status   Specimen Description   Final    EXPECTORATED SPUTUM Performed at [facility], [address] [place], [place] [address]    Special Requests   Final    NONE Reflexed from [id number] Performed at [facility], [place], [place] [address]    Gram Stain   Final    MODERATE WBC PRESENT,BOTH PMN AND MONONUCLEAR RARE GRAM NEGATIVE RODS RARE GRAM POSITIVE COCCI    Culture   Final    FEW Normal respiratory flora-no Staph aureus or Pseudomonas seen Performed at [facility], [place] [address], [place], [place] [address]    Report Status 10/24/2020 FINAL  Final         Radiology Studies: DG Chest Port 1 View  Result Date: 10/23/2020 CLINICAL DATA:  Respiratory failure, increasing shortness of breath EXAM: PORTABLE CHEST 1 VIEW COMPARISON:  10/20/2020 FINDINGS: Unchanged  mildly enlarged cardiac contour. Redemonstrated diffuse airspace and interstitial opacities bilaterally, which appear largely unchanged compared to the prior exam. No definite pleural effusion. Aortic calcifications. No acute osseous abnormality. IMPRESSION: Unchanged bilateral pulmonary opacities, which could represent edema or infection. Electronically Signed   By: Merilyn Baba M.D.   On: 10/23/2020 17:49   ECHOCARDIOGRAM COMPLETE  Result Date: 10/23/2020    ECHOCARDIOGRAM REPORT   Patient Name:   [name] Browning Date of Exam: 10/23/2020 Medical Rec #:  [id number]     Height:       74.0 in Accession #:    [id number]    Weight:       180.0 lb Date of Birth:  1940-06-21     BSA:          2.078 m Patient Age:    86 years      BP:           133/80 mmHg Patient Gender: M             HR:           95 bpm. Exam Location:  [facility] Procedure: 2D Echo, Cardiac Doppler and Color Doppler Indications:     CHF-acute diastolic Q67.61  History:         Patient has prior history of Echocardiogram examinations, most                  recent 04/20/2015. Angina, COPD and TIA; Signs/Symptoms:Syncope.  Sonographer:     Sherrie Sport Referring Phys:  [id number] [place] Diagnosing Phys: Ida Rogue MD  Sonographer Comments: Suboptimal apical window. IMPRESSIONS  1. Left ventricular ejection fraction, by estimation, is 60 to 65%. The left ventricle has normal function. The left ventricle has no regional wall motion abnormalities. Left ventricular diastolic parameters are indeterminate.  2. Right ventricular systolic function is normal. The right ventricular size is normal. There is  mildly elevated pulmonary artery systolic pressure.  3. Rhythm is atrial fibrillation  4. Left atrial size was moderately dilated. FINDINGS  Left Ventricle: Left ventricular ejection fraction, by estimation, is 60 to 65%. The left ventricle has normal function. The left ventricle has no regional wall motion abnormalities. The left ventricular internal cavity  size was normal in size. There is  no left ventricular hypertrophy. Left ventricular diastolic parameters are indeterminate. Right Ventricle: The right ventricular size is normal. No increase in right ventricular wall thickness. Right ventricular systolic function is normal. There is mildly elevated pulmonary artery systolic pressure. The tricuspid regurgitant velocity is 2.95  m/s, and with an assumed right atrial pressure of 5 mmHg, the estimated right ventricular systolic pressure is 18.8 mmHg. Left Atrium: Left atrial size was moderately dilated. Right Atrium: Right atrial size was normal in size. Pericardium: There is no evidence of pericardial effusion. Mitral Valve: The mitral valve is normal in structure. There is mild thickening of the mitral valve leaflet(s). No evidence of mitral valve regurgitation. No evidence of mitral valve stenosis. Tricuspid Valve: The tricuspid valve is normal in structure. Tricuspid valve regurgitation is mild . No evidence of tricuspid stenosis. Aortic Valve: The aortic valve is normal in structure. Aortic valve regurgitation is not visualized. No aortic stenosis is present. Aortic valve mean gradient measures 2.0 mmHg. Aortic valve peak gradient measures 4.7 mmHg. Aortic valve area, by VTI measures 3.04 cm. Pulmonic Valve: The pulmonic valve was normal in structure. Pulmonic valve regurgitation is not visualized. No evidence of pulmonic stenosis. Aorta: The aortic root is normal in size and structure. Venous: The inferior vena cava is normal in size with greater than 50% respiratory variability, suggesting right atrial pressure of 3 mmHg. IAS/Shunts: No atrial level shunt detected by color flow Doppler.  LEFT VENTRICLE PLAX 2D LVIDd:         4.19 cm LVIDs:         2.93 cm LV PW:         1.33 cm LV IVS:        0.95 cm LVOT diam:     2.00 cm LV SV:         55 LV SV Index:   26 LVOT Area:     3.14 cm  RIGHT VENTRICLE RV Basal diam:  4.55 cm RV S prime:     7.51 cm/s TAPSE  (M-mode): 3.8 cm LEFT ATRIUM              Index        RIGHT ATRIUM           Index LA diam:        3.50 cm  1.68 cm/m   RA Area:     26.60 cm LA Vol (A2C):   93.4 ml  44.94 ml/m  RA Volume:   96.30 ml  46.34 ml/m LA Vol (A4C):   128.0 ml 61.59 ml/m LA Biplane Vol: 113.0 ml 54.37 ml/m  AORTIC VALVE                    PULMONIC VALVE AV Area (Vmax):    2.83 cm     PV Vmax:        0.96 m/s AV Area (Vmean):   2.84 cm     PV Peak grad:   3.6 mmHg AV Area (VTI):     3.04 cm     RVOT Peak grad: 4 mmHg AV Vmax:  108.00 cm/s AV Vmean:          70.100 cm/s AV VTI:            0.181 m AV Peak Grad:      4.7 mmHg AV Mean Grad:      2.0 mmHg LVOT Vmax:         97.30 cm/s LVOT Vmean:        63.300 cm/s LVOT VTI:          0.175 m LVOT/AV VTI ratio: 0.97  AORTA Ao Root diam: 3.30 cm MITRAL VALVE                TRICUSPID VALVE MV Area (PHT): 4.52 cm     TR Peak grad:   34.8 mmHg MV Decel Time: 168 msec     TR Vmax:        295.00 cm/s MV E velocity: 134.00 cm/s                             SHUNTS                             Systemic VTI:  0.18 m                             Systemic Diam: 2.00 cm Ida Rogue MD Electronically signed by Ida Rogue MD Signature Date/Time: 10/23/2020/5:30:24 PM    Final         Scheduled Meds:  apixaban  5 mg Oral BID   aspirin EC  81 mg Oral QPC supper   atorvastatin  40 mg Oral QPC supper   budesonide (PULMICORT) nebulizer solution  0.25 mg Nebulization BID   famotidine  20 mg Oral Daily   ferrous gluconate  324 mg Oral QPC supper   guaiFENesin  600 mg Oral BID   ipratropium-albuterol  3 mL Nebulization TID   loratadine  10 mg Oral Daily   methylPREDNISolone (SOLU-MEDROL) injection  40 mg Intravenous Q12H   metoprolol tartrate  12.5 mg Oral Q6H   montelukast  10 mg Oral QHS   pantoprazole  40 mg Oral QPC supper   tamsulosin  0.4 mg Oral Daily   Continuous Infusions:  sodium chloride 20 mL/hr at 10/22/20 4818   ceFEPime (MAXIPIME) IV 2 g (10/25/20 0654)    vancomycin 1,750 mg (10/24/20 2354)    Assessment & Plan:   Principal Problem:   Acute on chronic respiratory failure with hypoxia (HCC) Active Problems:   History of TIA (transient ischemic attack)   Hyperlipidemia   AF (paroxysmal atrial fibrillation) (HCC)   Urinary tract obstruction by kidney stone   Community acquired pneumonia   Sepsis ([place])   Hypotension   Septic shock ([place])   Acute on chronic hypoxemic respiratory failure secondary to community-acquired pneumonia versus acute diastolic HF - Patient baseline on 2 L.  He was discharged on this after COPD exacerbation in September.  Now presenting with tachypnea with respiratory rate of 24, hypoxic with ambulation and exertion requiring up to 4 L  - Previously treated for pneumonia in September as well with IV Rocephin and azithromycin.  Continue IV cefepime and azithromycin started in the ED 10/12 continue iv abx 10/13 dc iv steroid, change to po prednisone 40mg  qd start in am. Will give iv lasix 20mg  x1 appears to be volume  overloaded mildly  Bnp elevated 10/15-clinically slowly improving.  Still has some shortness of breath.  Looking at telemetry she he has tachycardia that may be also causing some shortness of breath that he is experiencing.  He appears to be wheezing on exam.  We will switch his p.o. steroids to IV  clinically improving. Continue 02 supplement keeping 02>92%. Echo EF normal 10/16 still quite rhonchorus and wheezy on exam, although at baseline 02 2L [place] now. Will continue iv steroid Give lasix 20mg  iv x1 again today      Septic shock secondary to community-acquired pneumonia versus and UTI UCX + staph. Haemol. And E. Enteroc.face Off levophed on 10/12.  10/16 bcx TDN Ucx enter. Fac. Resistent to vanco. Spoke to pharmac and will switch to zyvox      UTI +ucx for staph haemolyticus and enteroco. faecium 101/6 switch vanco to zyvox since enter. Fac on urcx resistent to vanco   Paroxysmal atrial  fibrillation Now with rapid ventricular response 10/16 rate better controlled with metoprolol. Will switch to Toprol-XL 25 mg twice daily Continue Eliquis    Thrombocytopenia Mild likely due to infection Improving  Recent obstructing left ureteral stone with left hydronephrosis - Creatinine currently stable - Patient had left ureteral stent placed on 9/11 by Dr. Abner Greenspan - 10/7-underwent ureteroscopy, lithotripsy  -CT renal stone shows possible distal migration of the ureteral stent.  Will need to discuss with urology in the morning but doubt any GU infection needing urgent intervention since he has negative UA, no GI symptoms and benign abdominal exam. -continue Tamsulosin 10/12- will consult urology for malposition of stent urologist input was appreciated.  His urethral stent was removed.  Will need to follow-up outpatient with urology in 6 months with KUB prior for stone monitoring    History of TIA Continue aspirin   Hyperlipidemia -continues statin   DVT prophylaxis: Eliquis Code Status: Full Family Communication: None at bedside Disposition Plan:  Status is: Inpatient  Remains inpatient appropriate because:Inpatient level of care appropriate due to severity of illness  Dispo: The patient is from: Home              Anticipated d/c is to: Home              Patient currently is not medically stable to d/c.   Difficult to place patient No            LOS: 5 days   Time spent: 35 minutes with more than 50% on [place], MD [facility] Pager [phone]  If 7PM-7AM, please contact night-coverage 10/25/2020, 8:35 AM

## 2020-10-26 DIAGNOSIS — J9621 Acute and chronic respiratory failure with hypoxia: Secondary | ICD-10-CM | POA: Diagnosis not present

## 2020-10-26 LAB — CBC
HCT: 31.6 % — ABNORMAL LOW (ref 39.0–52.0)
Hemoglobin: 10.3 g/dL — ABNORMAL LOW (ref 13.0–17.0)
MCH: 30.8 pg (ref 26.0–34.0)
MCHC: 32.6 g/dL (ref 30.0–36.0)
MCV: 94.6 fL (ref 80.0–100.0)
Platelets: 186 10*3/uL (ref 150–400)
RBC: 3.34 MIL/uL — ABNORMAL LOW (ref 4.22–5.81)
RDW: 14.1 % (ref 11.5–15.5)
WBC: 11.1 10*3/uL — ABNORMAL HIGH (ref 4.0–10.5)
nRBC: 0.2 % (ref 0.0–0.2)

## 2020-10-26 LAB — BASIC METABOLIC PANEL
Anion gap: 5 (ref 5–15)
BUN: 43 mg/dL — ABNORMAL HIGH (ref 8–23)
CO2: 27 mmol/L (ref 22–32)
Calcium: 8.5 mg/dL — ABNORMAL LOW (ref 8.9–10.3)
Chloride: 103 mmol/L (ref 98–111)
Creatinine, Ser: 1.24 mg/dL (ref 0.61–1.24)
GFR, Estimated: 59 mL/min — ABNORMAL LOW (ref >60)
Glucose, Bld: 139 mg/dL — ABNORMAL HIGH (ref 70–99)
Potassium: 3.8 mmol/L (ref 3.5–5.1)
Sodium: 135 mmol/L (ref 135–145)

## 2020-10-26 MED ORDER — PREDNISONE 10 MG PO TABS: 10.0000 mg | ORAL_TABLET | Freq: Every day | ORAL | Status: DC

## 2020-10-26 MED ORDER — CALCIUM GLUCONATE-NACL 2-0.675 GM/100ML-% IV SOLN
2.0000 g | Freq: Once | INTRAVENOUS | Status: AC
  Administered 2020-10-27: 2000 mg via INTRAVENOUS
  Filled 2020-10-26: qty 100

## 2020-10-26 MED ORDER — PREDNISONE 20 MG PO TABS
40.0000 mg | ORAL_TABLET | Freq: Every day | ORAL | Status: DC
  Administered 2020-10-27: 40 mg via ORAL
  Filled 2020-10-26: qty 2

## 2020-10-26 NOTE — Care Management Important Message (Signed)
Important Message  Patient Details  Name: [name] Browning MRN: [id number] Date of Birth: 1940-06-01   Medicare Important Message Given:  Yes     Dannette Barbara 10/26/2020, 2:30 PM

## 2020-10-26 NOTE — Progress Notes (Signed)
Occupational Therapy Treatment Patient Details Name: [name] Browning MRN: [id number] DOB: 11/12/1940 Today's Date: 10/26/2020   History of present illness 80 y.o. male presenting to ED with concerns of weakness and increasing shortness of breath. Medical history significant for COPD with chronic hypoxemia on 2 L, paroxysmal atrial fibrillation on Eliquis, hypertension, TIA and recent nephrolithiasis s/p ureteral stent.   OT comments  Pt seen for OT treatment on this date. Upon arrival to room, pt seated upright in bed. Pt stated that he worked with PT 1 hour prior, however was agreeable to OT tx. Pt currently presents with L-side abdomen pain, decreased balance, and decreased activity tolerance. Due to these functional impairments, pt requires SUPERVISION for bed mobility, MIN GUARD for seated LB dressing, MIN GUARD for functional mobility of short household distance (~57ft) with RW, and MIN A for standing grooming tasks. Pt noted to have x2 LOB during OOB mobility, requiring CGA to steady. Additionally, pt with SpO2 desat 87% following functional mobility while on 3L of O2 (able to increase to 92% within 30 sec of PLB); RN informed. While seated EOB, pt engaged in seated UE therex (see below). Pt continues to benefit from skilled OT services to maximize return to PLOF and minimize risk of future falls, injury, caregiver burden, and readmission. Will continue to follow POC. Discharge recommendation remains appropriate.     Recommendations for follow up therapy are one component of a multi-disciplinary discharge planning process, led by the attending physician.  Recommendations may be updated based on patient status, additional functional criteria and insurance authorization.    Follow Up Recommendations  Home health OT;Supervision/Assistance - 24 hour    Equipment Recommendations  3 in 1 bedside commode;Tub/shower seat       Precautions / Restrictions Precautions Precautions:  Fall Restrictions Weight Bearing Restrictions: No       Mobility Bed Mobility Overal bed mobility: Needs Assistance Bed Mobility: Sit to Supine      Sit to supine: Supervision    Transfers Overall transfer level: Needs assistance Equipment used: Rolling walker (2 wheeled) Transfers: Sit to/from Stand Sit to Stand: Min guard         General transfer comment: Verbal cues for safe hand placement with RW use    Balance Overall balance assessment: Needs assistance Sitting-balance support: No upper extremity supported;Feet supported Sitting balance-Leahy Scale: Good     Standing balance support: During functional activity;Bilateral upper extremity supported Standing balance-Leahy Scale: Poor Standing balance comment: Pt with x2 LOB during functional mobility with BUE support from RW, requiring CGA to steady.                           ADL either performed or assessed with clinical judgement   ADL Overall ADL's : Needs assistance/impaired     Grooming: Applying deodorant;Minimal assistance;Standing Grooming Details (indicate cue type and reason): MIN A to open deodorant (pt reporting decreased fine motor coordination d/t carpal tunnel at baseline). MIN GUARD to steady following x1 lateral LOB             Lower Body Dressing: Min guard;Sitting/lateral leans Lower Body Dressing Details (indicate cue type and reason): MIN GUARD to bend over and manage socks             Functional mobility during ADLs: Min guard;Rolling walker (to walk ~26ft, requiring MIN GUARD d/t poor balance with increasing fatigue)        Cognition Arousal/Alertness: Awake/alert Behavior During Therapy:  WFL for tasks assessed/performed;Flat affect Overall Cognitive Status: Within Functional Limits for tasks assessed                                          Exercises General Exercises - Upper Extremity Shoulder Flexion: AROM;Both;10 reps;Seated Shoulder  Horizontal ABduction: AROM;Both;10 reps;Seated Elbow Flexion: AROM;Both;10 reps;Seated       General Comments While on 3L, SpO2 desat 87% following functional mobility. Able to increase to 92% following seated rest break and PLB    Pertinent Vitals/ Pain       Pain Assessment: Faces Faces Pain Scale: Hurts a little bit Pain Location: left side abdomen Pain Descriptors / Indicators: Cramping;Grimacing Pain Intervention(s): Monitored during session;Limited activity within patient's tolerance         Frequency  Min 1X/week        Progress Toward Goals  OT Goals(current goals can now be found in the care plan section)  Progress towards OT goals: Progressing toward goals  Acute Rehab OT Goals Patient Stated Goal: to walk longer distances OT Goal Formulation: With patient Time For Goal Achievement: 11/06/20 Potential to Achieve Goals: Good  Plan Discharge plan remains appropriate;Frequency remains appropriate       AM-PAC OT "6 Clicks" Daily Activity     Outcome Measure   Help from another person eating meals?: None Help from another person taking care of personal grooming?: A Little Help from another person toileting, which includes using toliet, bedpan, or urinal?: A Little Help from another person bathing (including washing, rinsing, drying)?: A Little Help from another person to put on and taking off regular upper body clothing?: None Help from another person to put on and taking off regular lower body clothing?: A Little 6 Click Score: 20    End of Session Equipment Utilized During Treatment: Gait belt;Oxygen;Rolling walker  OT Visit Diagnosis: Muscle weakness (generalized) (M62.81)   Activity Tolerance Patient tolerated treatment well   Patient Left in bed;with call bell/phone within reach;with bed alarm set   Nurse Communication Mobility status        Time: 4665-9935 OT Time Calculation (min): 30 min  Charges: OT General Charges $OT Visit: 1  Visit OT Treatments $Self Care/Home Management : 8-22 mins $Therapeutic Activity: 8-22 mins  Fredirick Maudlin, OTR/L [place]

## 2020-10-26 NOTE — Progress Notes (Signed)
Physical Therapy Treatment Patient Details Name: [name] Browning MRN: [id number] DOB: Oct 10, 1940 Today's Date: 10/26/2020   History of Present Illness 80 y.o. male presenting to ED with concerns of weakness and increasing shortness of breath. Medical history significant for COPD with chronic hypoxemia on 2 L, paroxysmal atrial fibrillation on Eliquis, hypertension, TIA and recent nephrolithiasis s/p ureteral stent.    PT Comments    Pt received supine in bed, agreeable to therapy and recounting an incident from earlier today in which he felt like he was going "to faint." RN has checked orthostatics. Pt remained on 3L O2 throughout session; SpO2 97% at rest and 92% or greater with mobility. Respiratory rate did increase, especially with therex, causing pt to cough and PT VC on breathing techniques and to recover breathing prior to returning to therex. Therex targeting quad strength at end of session due to knee buckling noted in the RLE with fatigue during ambulation. Education on purpose of therex and encouraged pt to continue with these exercises as HEP (STS only with staff present). Pt would benefit from continued skilled PT interventions to continue progressing functional and muscular endurance as well as LE strength.    Recommendations for follow up therapy are one component of a multi-disciplinary discharge planning process, led by the attending physician.  Recommendations may be updated based on patient status, additional functional criteria and insurance authorization.  Follow Up Recommendations  Home health PT;Supervision/Assistance - 24 hour     Equipment Recommendations  None recommended by PT    Recommendations for Other Services       Precautions / Restrictions Precautions Precautions: Fall Restrictions Weight Bearing Restrictions: No     Mobility  Bed Mobility Overal bed mobility: Needs Assistance Bed Mobility: Supine to Sit     Supine to sit: Supervision;HOB  elevated     General bed mobility comments: Increased time, use of bed rails    Transfers Overall transfer level: Needs assistance Equipment used: None Transfers: Sit to/from Stand Sit to Stand: Min guard         General transfer comment: Good control from low surface. 1 rep prior to ambulation, 5x from low surface and 5x from elevated surface no UE support for LE strengthening  Ambulation/Gait Ambulation/Gait assistance: Min guard Gait Distance (Feet): 120 Feet Assistive device: Rolling walker (2 wheeled) Gait Pattern/deviations: Step-through pattern;Decreased stride length Gait velocity: decreased   General Gait Details: R knee buckling on multiple occasions in final 73ft requiring CGA to steady. Unsteady during 180* turn. Pt reporting fatigue in BLE.   Stairs             Wheelchair Mobility    Modified Rankin (Stroke Patients Only)       Balance Overall balance assessment: Needs assistance Sitting-balance support: No upper extremity supported;Feet supported Sitting balance-Leahy Scale: Good     Standing balance support: During functional activity;Bilateral upper extremity supported Standing balance-Leahy Scale: Poor Standing balance comment: Knee buckling during ambulation with BUE support on RW requiring CGA to steady. Significantly decreased steadiness and foot clearance during 180* turn.                            Cognition Arousal/Alertness: Awake/alert Behavior During Therapy: WFL for tasks assessed/performed;Flat affect Overall Cognitive Status: Within Functional Limits for tasks assessed  Exercises Other Exercises Other Exercises: LE therex to increase quad strength 2/2 knee buckling: STS x10 with no UE support from elevated surface, LAQ x5 w/ 5 second hold each LE, SLR x5 w/ 5 second hold each LE.    General Comments        Pertinent Vitals/Pain Pain Assessment:  Faces Faces Pain Scale: Hurts a little bit Pain Location: left side abdomen Pain Descriptors / Indicators: Cramping;Grimacing    Home Living                      Prior Function            PT Goals (current goals can now be found in the care plan section) Acute Rehab PT Goals Patient Stated Goal: to walk longer distances    Frequency    Min 2X/week      PT Plan      Co-evaluation              AM-PAC PT "6 Clicks" Mobility   Outcome Measure  Help needed turning from your back to your side while in a flat bed without using bedrails?: None Help needed moving from lying on your back to sitting on the side of a flat bed without using bedrails?: None Help needed moving to and from a bed to a chair (including a wheelchair)?: A Little Help needed standing up from a chair using your arms (e.g., wheelchair or bedside chair)?: A Little Help needed to walk in hospital room?: A Little Help needed climbing 3-5 steps with a railing? : A Lot 6 Click Score: 19    End of Session Equipment Utilized During Treatment: Gait belt;Oxygen Activity Tolerance: Patient limited by fatigue;Patient tolerated treatment well Patient left: with call bell/phone within reach;in bed;with bed alarm set Nurse Communication: Mobility status PT Visit Diagnosis: Unsteadiness on feet (R26.81);Other abnormalities of gait and mobility (R26.89);Muscle weakness (generalized) (M62.81);Difficulty in walking, not elsewhere classified (R26.2)     Time: 0938-1829 PT Time Calculation (min) (ACUTE ONLY): 32 min  Charges:  $Gait Training: 8-22 mins $Therapeutic Exercise: 8-22 mins                     Patrina Levering PT, DPT 10/26/20 3:04 PM [phone]

## 2020-10-26 NOTE — Progress Notes (Addendum)
Other PROGRESS NOTE    [name] Browning  JKK:[id number] DOB: 10-31-1940 DOA: 10/20/2020 PCP: [place]    Brief Narrative:  [name] Browning is a 80 y.o. male with medical history significant for COPD with chronic hypoxemia on 2 L, paroxysmal atrial fibrillation on Eliquis, hypertension, TIA and recent nephrolithiasis s/p ureteral stent who presents with concerns of weakness and increasing shortness of breath.   Patient recently hospitalized in September for abdominal pain and shortness of breath.  He was found to have COPD with pneumonia.  He also had obstructing left proximal ureteral stone with left-sided hydronephrosis, AKI and underwent left ureteral stent by urology on 9/11.  Subsequently following procedure he developed interstitial edema and hypotension requiring ICU/SDM BiPAP.   Patient reports that since returning home from this admission he has not felt back to baseline.  Has continue progression of shortness of breath and could barely walk down the hall at home.  Has been feeling weak.  Has cough.  Daughter notes temperature of 103F day.  In the ED, he was febrile up to 101.1, hypotensive down to 80s over 50s, tachycardic heart rate of 109, tachypneic RR of 24 and was on nonrebreather with 4L at the time my evaluation.   CBC showed no leukocytosis, hemoglobin stable at 10.8, platelet 147, sodium 139, potassium 3.9, BG of 110. UA shows moderate leukocyte, negative nitrite but no bacteria.  This Chest x-ray with bilateral infiltrate edema versus pneumonia.  CT renal stone search shows bilateral mild pleural effusion and possible malpositioning of urethral stent.   He was started on cefepime and Rocephin.  Also given DuoNeb x2.    10/12 off of Levophed BP stable 1013 feels short of breath today.  No other complaint 10/14 still feels sob. Sounds mildly congested. Clinically appears better. On 3-4 Lnc 10/15 still with sob, but little better than before. Tele with afib rvr heart  rate 130s. 10/16 daughter at bedside, who reports father looks much better. Sob improving, +cough.  Per daughter since he had covid he has been having issues.  Tele afib with hr better controlled. 10/17 was planning to discharge patient today.  However after discussing discharge patient was walking to the bathroom on his way back he became flushed and dizzy and did not feel well.  The event scared him.  Sounded more like vasovagal.  But we will be keeping him Orthostatic was negative.   Consultants:  PCCM  Procedures:   Antimicrobials:   Cefepime and vanco  Subjective: From breathing stand point feels getting better. No cp.   Objective: Vitals:   10/26/20 0445 10/26/20 0716 10/26/20 0906 10/26/20 1211  BP: 115/64 116/68  118/66  Pulse: 92 70 78 84  Resp: 18 18 18 17   Temp: 98.1 F (36.7 C) 97.8 F (36.6 C)  97.9 F (36.6 C)  TempSrc: Oral Oral  Oral  SpO2: 97% 96% 95% 95%  Weight:      Height:        Intake/Output Summary (Last 24 hours) at 10/26/2020 1418 Last data filed at 10/26/2020 1212 Gross per 24 hour  Intake 2576.17 ml  Output 3175 ml  Net -598.83 ml   Filed Weights   10/20/20 1722  Weight: 81.6 kg    Examination: Nad, calm  Mild rhonchi, end expiratory wheezing Irreg-reg s1/s2 no gallop Soft benign +bs No edema aaoxox3   Data Reviewed: I have personally reviewed following labs and imaging studies  CBC: Recent Labs  Lab 10/20/20 1733 10/21/20  8841 10/22/20 0624 10/23/20 0926 10/26/20 0831  WBC 9.8 8.2 8.0 9.6 11.1*  HGB 10.8* 9.2* 9.8* 10.8* 10.3*  HCT 32.4* 28.8* 30.5* 33.7* 31.6*  MCV 94.5 98.0 93.8 96.8 94.6  PLT 147* 128* 145* 155 660   Basic Metabolic Panel: Recent Labs  Lab 10/21/20 0632 10/22/20 0624 10/23/20 0450 10/25/20 0543 10/26/20 0436  NA 137 137 136 137 135  K 3.7 4.4 4.0 4.1 3.8  CL 103 108 107 106 103  CO2 26 24 25 27 27   GLUCOSE 113* 146* 108* 176* 139*  BUN 18 18 31* 40* 43*  CREATININE 0.96 0.75 1.08 0.93  1.24  CALCIUM 7.7* 8.4* 8.2* 8.2* 8.5*   GFR: Estimated Creatinine Clearance: 55.8 mL/min (by C-G formula based on SCr of 1.24 mg/dL). Liver Function Tests: Recent Labs  Lab 10/20/20 1733  AST 22  ALT 23  ALKPHOS 85  BILITOT 1.3*  PROT 5.9*  ALBUMIN 2.9*   No results for input(s): LIPASE, AMYLASE in the last 168 hours. No results for input(s): AMMONIA in the last 168 hours. Coagulation Profile: Recent Labs  Lab 10/20/20 2020  INR 1.5*   Cardiac Enzymes: No results for input(s): CKTOTAL, CKMB, CKMBINDEX, TROPONINI in the last 168 hours. BNP (last 3 results) No results for input(s): PROBNP in the last 8760 hours. HbA1C: No results for input(s): HGBA1C in the last 72 hours. CBG: No results for input(s): GLUCAP in the last 168 hours. Lipid Profile: No results for input(s): CHOL, HDL, LDLCALC, TRIG, CHOLHDL, LDLDIRECT in the last 72 hours. Thyroid Function Tests: No results for input(s): TSH, T4TOTAL, FREET4, T3FREE, THYROIDAB in the last 72 hours. Anemia Panel: No results for input(s): VITAMINB12, FOLATE, FERRITIN, TIBC, IRON, RETICCTPCT in the last 72 hours. Sepsis Labs: Recent Labs  Lab 10/20/20 2020 10/21/20 6301 10/21/20 0744 10/22/20 0624 10/23/20 0450  PROCALCITON  --  <0.10  --  <0.10 <0.10  LATICACIDVEN 1.0 1.8 1.7  --   --     Recent Results (from the past 240 hour(s))  Resp Panel by RT-PCR (Flu A&B, Covid) Nasopharyngeal Swab     Status: None   Collection Time: 10/20/20  5:33 PM   Specimen: Nasopharyngeal Swab; Nasopharyngeal(NP) swabs in vial transport medium  Result Value Ref Range Status   SARS Coronavirus 2 by RT PCR NEGATIVE NEGATIVE Final    Comment: (NOTE) SARS-CoV-2 target nucleic acids are NOT DETECTED.  The SARS-CoV-2 RNA is generally detectable in upper respiratory specimens during the acute phase of infection. The lowest concentration of SARS-CoV-2 viral copies this assay can detect is 138 copies/mL. A negative result does not preclude  SARS-Cov-2 infection and should not be used as the sole basis for treatment or other patient management decisions. A negative result may occur with  improper specimen collection/handling, submission of specimen other than nasopharyngeal swab, presence of viral mutation(s) within the areas targeted by this assay, and inadequate number of viral copies(<138 copies/mL). A negative result must be combined with clinical observations, patient history, and epidemiological information. The expected result is Negative.  Fact Sheet for Patients:  EntrepreneurPulse.com.au  Fact Sheet for Healthcare Providers:  IncredibleEmployment.be  This test is no t yet approved or cleared by the [place] FDA and  has been authorized for detection and/or diagnosis of SARS-CoV-2 by FDA under an Emergency Use Authorization (EUA). This EUA will remain  in effect (meaning this test can be used) for the duration of the COVID-19 declaration under Section 564(b)(1) of the Act, 21 U.S.C.section 360bbb-3(b)(1),  unless the authorization is terminated  or revoked sooner.       Influenza A by PCR NEGATIVE NEGATIVE Final   Influenza B by PCR NEGATIVE NEGATIVE Final    Comment: (NOTE) The Xpert Xpress SARS-CoV-2/FLU/RSV plus assay is intended as an aid in the diagnosis of influenza from Nasopharyngeal swab specimens and should not be used as a sole basis for treatment. Nasal washings and aspirates are unacceptable for Xpert Xpress SARS-CoV-2/FLU/RSV testing.  Fact Sheet for Patients: EntrepreneurPulse.com.au  Fact Sheet for Healthcare Providers: IncredibleEmployment.be  This test is not yet approved or cleared by the [place] FDA and has been authorized for detection and/or diagnosis of SARS-CoV-2 by FDA under an Emergency Use Authorization (EUA). This EUA will remain in effect (meaning this test can be used) for the duration of  the COVID-19 declaration under Section 564(b)(1) of the Act, 21 U.S.C. section 360bbb-3(b)(1), unless the authorization is terminated or revoked.  Performed at [facility], [address], [place], [place] [address]   Blood Culture (routine x 2)     Status: None   Collection Time: 10/20/20  5:58 PM   Specimen: BLOOD  Result Value Ref Range Status   Specimen Description BLOOD BLOOD LEFT FOREARM  Final   Special Requests   Final    BOTTLES DRAWN AEROBIC AND ANAEROBIC Blood Culture results may not be optimal due to an excessive volume of blood received in culture bottles   Culture   Final    NO GROWTH 5 DAYS Performed at [facility], [address] [place], [place] [address]    Report Status 10/25/2020 FINAL  Final  Blood Culture (routine x 2)     Status: None   Collection Time: 10/20/20  5:58 PM   Specimen: BLOOD  Result Value Ref Range Status   Specimen Description BLOOD RIGHT ANTECUBITAL  Final   Special Requests   Final    BOTTLES DRAWN AEROBIC AND ANAEROBIC Blood Culture results may not be optimal due to an excessive volume of blood received in culture bottles   Culture   Final    NO GROWTH 5 DAYS Performed at [facility], [address] [place], [place] [address]    Report Status 10/25/2020 FINAL  Final  Urine Culture     Status: Abnormal   Collection Time: 10/20/20  8:20 PM   Specimen: Urine, Clean Catch  Result Value Ref Range Status   Specimen Description   Final    URINE, CLEAN CATCH Performed at [facility], [address] [place], [place] [address]    Special Requests   Final    NONE Performed at [facility], [address], [place], [place] [address]    Culture (A)  Final    >=100,000 COLONIES/mL STAPHYLOCOCCUS HAEMOLYTICUS >=100,000 COLONIES/mL VANCOMYCIN RESISTANT ENTEROCOCCUS    Report Status 10/24/2020 FINAL  Final   Organism ID, Bacteria STAPHYLOCOCCUS HAEMOLYTICUS (A)  Final   Organism ID,  Bacteria VANCOMYCIN RESISTANT ENTEROCOCCUS (A)  Final      Susceptibility   Staphylococcus haemolyticus - MIC*    CIPROFLOXACIN >=8 RESISTANT Resistant     GENTAMICIN >=16 RESISTANT Resistant     NITROFURANTOIN <=16 SENSITIVE Sensitive     OXACILLIN >=4 RESISTANT Resistant     TETRACYCLINE 2 SENSITIVE Sensitive     VANCOMYCIN 1 SENSITIVE Sensitive     TRIMETH/SULFA >=320 RESISTANT Resistant     CLINDAMYCIN >=8 RESISTANT Resistant     RIFAMPIN >=32 RESISTANT Resistant  Inducible Clindamycin NEGATIVE Sensitive     * >=100,000 COLONIES/mL STAPHYLOCOCCUS HAEMOLYTICUS   Vancomycin resistant enterococcus - MIC*    AMPICILLIN >=32 RESISTANT Resistant     NITROFURANTOIN 64 INTERMEDIATE Intermediate     VANCOMYCIN >=32 RESISTANT Resistant     LINEZOLID 2 SENSITIVE Sensitive     * >=100,000 COLONIES/mL VANCOMYCIN RESISTANT ENTEROCOCCUS  Expectorated Sputum Assessment w Gram Stain, Rflx to Resp Cult     Status: None   Collection Time: 10/22/20  9:10 AM   Specimen: Expectorated Sputum  Result Value Ref Range Status   Specimen Description EXPECTORATED SPUTUM  Final   Special Requests NONE  Final   Sputum evaluation   Final    THIS SPECIMEN IS ACCEPTABLE FOR SPUTUM CULTURE Performed at [facility], [address], [place], [place] [address]    Report Status 10/22/2020 FINAL  Final  Culture, Respiratory w Gram Stain     Status: None   Collection Time: 10/22/20  9:10 AM  Result Value Ref Range Status   Specimen Description   Final    EXPECTORATED SPUTUM Performed at [facility], [address] [place], [place] [address]    Special Requests   Final    NONE Reflexed from [id number] Performed at [facility], [address] [place], [place] [address]    Gram Stain   Final    MODERATE WBC PRESENT,BOTH PMN AND MONONUCLEAR RARE GRAM NEGATIVE RODS RARE GRAM POSITIVE COCCI    Culture   Final    FEW Normal respiratory flora-no Staph aureus or Pseudomonas  seen Performed at [facility], [place] [address], [place],  [address]    Report Status 10/24/2020 FINAL  Final         Radiology Studies: No results found.      Scheduled Meds:  apixaban  5 mg Oral BID   aspirin EC  81 mg Oral QPC supper   atorvastatin  40 mg Oral QPC supper   budesonide (PULMICORT) nebulizer solution  0.25 mg Nebulization BID   famotidine  20 mg Oral Daily   ferrous gluconate  324 mg Oral QPC supper   guaiFENesin  600 mg Oral BID   ipratropium-albuterol  3 mL Nebulization TID   loratadine  10 mg Oral Daily   metoprolol succinate  25 mg Oral BID   montelukast  10 mg Oral QHS   pantoprazole  40 mg Oral QPC supper   [START ON 10/27/2020] predniSONE  40 mg Oral Q breakfast   tamsulosin  0.4 mg Oral Daily   Continuous Infusions:  sodium chloride 20 mL/hr at 10/22/20 8453   linezolid (ZYVOX) IV Stopped (10/25/20 2346)    Assessment & Plan:   Principal Problem:   Acute on chronic respiratory failure with hypoxia (HCC) Active Problems:   History of TIA (transient ischemic attack)   Hyperlipidemia   AF (paroxysmal atrial fibrillation) ([place])   Urinary tract obstruction by kidney stone   Community acquired pneumonia   Sepsis ([place])   Hypotension   Septic shock ([place])   Acute on chronic hypoxemic respiratory failure secondary to community-acquired pneumonia versus acute diastolic HF - Patient baseline on 2 L.  He was discharged on this after COPD exacerbation in September.  Now presenting with tachypnea with respiratory rate of 24, hypoxic with ambulation and exertion requiring up to 4 L  - Previously treated for pneumonia in September as well with IV Rocephin and azithromycin.  Continue IV cefepime and azithromycin started in  the ED 10/12 continue iv abx 10/13 dc iv steroid, change to po prednisone 40mg  qd start in am. Will give iv lasix 20mg  x1 appears to be volume overloaded mildly  Bnp elevated 10/15-clinically slowly improving.  Still  has some shortness of breath.  Looking at telemetry she he has tachycardia that may be also causing some shortness of breath that he is experiencing.  He appears to be wheezing on exam.  We will switch his p.o. steroids to IV  clinically improving. Continue 02 supplement keeping 02>92%. Echo EF normal 10/17 euvolemic, cleared from pulmonary standpoint improving Switch IV steroids to p.o. Was given Lasix 20 mg IV x2 during hospitalization Incentive spirometer and flutter valve      Septic shock secondary to community-acquired pneumonia versus and UTI UCX + staph. Haemol. And E. Enteroc.face Off levophed on 10/12.  10/17 blood cultures today negative Urine culture with Enterococcus fae.started on Zyvox.  Will need to complete 7-day more of Zyvox since he was septic on admission         UTI +ucx for staph haemolyticus and enteroco. Faecium Complicated UTI Continue zyvox x7 more days 101/6 switch vanco to zyvox since enter. Fac on urcx resistent to vanco   Paroxysmal atrial fibrillation Now with rapid ventricular response 10/17- HR better controlled     Thrombocytopenia Likely due to infection Now improving  Recent obstructing left ureteral stone with left hydronephrosis - Creatinine currently stable - Patient had left ureteral stent placed on 9/11 by Dr. Abner Greenspan - 10/7-underwent ureteroscopy, lithotripsy  -CT renal stone shows possible distal migration of the ureteral stent.  Will need to discuss with urology in the morning but doubt any GU infection needing urgent intervention since he has negative UA, no GI symptoms and benign abdominal exam. -continue Tamsulosin 10/12- will consult urology for malposition of stent urologist input was appreciated.  His urethral stent was removed.  Will need to follow-up outpatient with urology in 6 months with KUB prior for stone monitoring    History of TIA Continue aspirin   Hyperlipidemia -continues statin   DVT prophylaxis:  Eliquis Code Status: Full Family Communication: None at bedside Disposition Plan:  Status is: Inpatient  Remains inpatient appropriate because:Inpatient level of care appropriate due to severity of illness  Dispo: The patient is from: Home              Anticipated d/c is to: Home              Patient currently is not medically stable to d/c.   Difficult to place patient No   Will likely DC in a.m. if he has no further episodes of dizziness.         LOS: 6 days   Time spent: 35 minutes with more than 50% on [place], MD [facility] Pager [phone]  If 7PM-7AM, please contact night-coverage 10/26/2020, 2:18 PM

## 2020-10-27 ENCOUNTER — Inpatient Hospital Stay: Payer: Medicare HMO

## 2020-10-27 DIAGNOSIS — J9621 Acute and chronic respiratory failure with hypoxia: Secondary | ICD-10-CM | POA: Diagnosis not present

## 2020-10-27 LAB — MAGNESIUM: Magnesium: 2.1 mg/dL (ref 1.7–2.4)

## 2020-10-27 LAB — CREATININE, SERUM
Creatinine, Ser: 1.05 mg/dL (ref 0.61–1.24)
GFR, Estimated: >60 mL/min (ref >60)

## 2020-10-27 MED ORDER — GUAIFENESIN 100 MG/5ML PO LIQD
10.0000 mL | ORAL | Status: DC | PRN
  Administered 2020-10-27 – 2020-10-28 (×2): 10 mL via ORAL
  Filled 2020-10-27 (×3): qty 10

## 2020-10-27 MED ORDER — IPRATROPIUM-ALBUTEROL 0.5-2.5 (3) MG/3ML IN SOLN
3.0000 mL | Freq: Two times a day (BID) | RESPIRATORY_TRACT | Status: DC
  Administered 2020-10-27 – 2020-10-31 (×8): 3 mL via RESPIRATORY_TRACT
  Filled 2020-10-27 (×8): qty 3

## 2020-10-27 MED ORDER — METOPROLOL SUCCINATE ER 25 MG PO TB24
12.5000 mg | ORAL_TABLET | Freq: Two times a day (BID) | ORAL | Status: DC
  Administered 2020-10-27 – 2020-10-31 (×8): 12.5 mg via ORAL
  Filled 2020-10-27 (×8): qty 1

## 2020-10-27 MED ORDER — METHYLPREDNISOLONE SODIUM SUCC 125 MG IJ SOLR
80.0000 mg | INTRAMUSCULAR | Status: DC
  Administered 2020-10-27: 80 mg via INTRAVENOUS
  Filled 2020-10-27: qty 2

## 2020-10-27 NOTE — Telephone Encounter (Signed)
Patient is currently admitted. Will keep encounter open for follow when he is discharged.

## 2020-10-27 NOTE — Consult Note (Signed)
Pulmonary Medicine          Date: 10/27/2020,   MRN# [id number] [name] Browning [birth year]     AdmissionWeight: 81.6 kg                 CurrentWeight: 81.6 kg  Referring physician: Dr Kurtis Bushman     CHIEF COMPLAINT:   Acute on chronic hypoxemic respiratory failure    HISTORY OF PRESENT ILLNESS   [name] Browning is a 80 y.o. male with medical history significant for COPD with chronic hypoxemia on 2 L, paroxysmal atrial fibrillation on Eliquis, hypertension, TIA and recent nephrolithiasis s/p ureteral stent who presents with concerns of weakness and increasing shortness of breath. Patient recently hospitalized in September 2022 for abdominal pain and shortness of breath.  He was found to have COPD with pneumonia.  He also had obstructing left proximal ureteral stone with left-sided hydronephrosis, AKI and underwent left ureteral stent by urology on 9/11.  Subsequently following procedure he developed interstitial edema and hypotension requiring ICU/SDM BiPAP.Patient reports that since returning home from this admission he has not felt back to baseline.  Has continue progression of shortness of breath and could barely walk down the hall at home.  Has been feeling weak.  Has cough.  Daughter notes temperature of 103F day.In the ED, he was febrile up to 101.1, hypotensive down to 80s over 50s, tachycardic heart rate of 109, tachypneic RR of 24 and was on nonrebreather with 4L at the time my evaluation.CBC showed no leukocytosis, hemoglobin stable at 10.8, platelet 147, sodium 139, potassium 3.9, BG of 110. UA shows moderate leukocyte, negative nitrite but no bacteria. Chest x-ray with bilateral infiltrate edema versus pneumonia.  PCCM consult for additional evaluation and management CXR reviewed by me and is + for edema bilaterally. He has 2+ peripheral edema.   I SPOKE WITH [name] next of kin for patient and we reviewed medical plan and hospital course  PAST MEDICAL HISTORY   Past  Medical History:  Diagnosis Date   Anginal pain ([place])    Anxiety    Aortic atherosclerosis ([place])    Atrial fibrillation ([place]) 01/2019   Bilateral carpal tunnel syndrome    CAD (coronary artery disease)    a.) LHC 2004 --> normal coronaries. b.) normal stress test in 2007 and 2011; c.) Lexiscan 05/20/2014 --> LVEF 55-65%; no significant stress induced ischemia/arrythmia. d.) CT chest 03/25/2019 --> coronaries carcified.   Carotid atherosclerosis, bilateral    Carpal tunnel syndrome, bilateral    Chronic anticoagulation    a.) ASA + apixaban   COPD (chronic obstructive pulmonary disease) (HCC)    CVA (cerebral vascular accident) ([place])    Degenerative disc disease, cervical    Diastolic dysfunction    a.) TTE 05/29/2014 --> LVEF 60-65%; G1DD.   DVT (deep venous thrombosis) (HCC)    GERD (gastroesophageal reflux disease)    History of 2019 novel coronavirus disease (COVID-19) 02/08/2019   HLD (hyperlipidemia)    Hypertension    Kidney stones    Osteoarthritis of right shoulder    Pneumonia    Respiratory failure, acute ([place]) 09/20/2020   a.) severe respiratory distress 1 hour after urological surgery. CXR (+) for acute pulmonary edema. Transferred to ICU and placed on NIPPV. Questionable aspiration PNA. (+) A.fib with RVR. Improved with ABX, diuresis, and amiodarone.   Skin cancer of face    a.) RIGHT ear and RIGHT forehead; excised.   Syncope    TIA (transient ischemic  attack) 2016   Valvular regurgitation    a.) TTE 05/26/2014 --> LVEF 60-65%; trivial MR, mild TR; no AR or PR. b.) TTE 04/20/2015 --> LVEF 55-60%; trivial MR and PR; no AR or TR.     SURGICAL HISTORY   Past Surgical History:  Procedure Laterality Date   CARDIAC CATHETERIZATION  2004   CARPAL TUNNEL RELEASE Right 06/11/2013   CYSTOSCOPY W/ RETROGRADES  10/16/2020   Procedure: CYSTOSCOPY WITH RETROGRADE PYELOGRAM;  Surgeon: Billey Co, MD;  Location: [facility] ORS;  Service: Urology;;   CYSTOSCOPY W/ URETERAL  STENT PLACEMENT Left 09/20/2020   Procedure: CYSTOSCOPY WITH RETROGRADE PYELOGRAM/URETERAL STENT PLACEMENT;  Surgeon: Janith Lima, MD;  Location: [facility] OR[facility];  Service: Urology;  Laterality: Left;   CYSTOSCOPY/URETEROSCOPY/HOLMIUM LASER/STENT PLACEMENT Left 10/16/2020   Procedure: CYSTOSCOPY/URETEROSCOPY/HOLMIUM LASER/STENT PLACEMENT;  Surgeon: Billey Co, MD;  Location: [facility] OR[facility];  Service: Urology;  Laterality: Left;   SHOULDER ARTHROSCOPY WITH OPEN ROTATOR CUFF REPAIR Right 08/24/2017   Procedure: SHOULDER ARTHROSCOPY WITH OPEN ROTATOR CUFF REPAIR;  Surgeon: Corky Mull, MD;  Location: [facility] ORS;  Service: Orthopedics;  Laterality: Right;   SKIN CANCER EXCISION  12/01/2016   right ear    SKIN CANCER EXCISION     remove from the right side of the face    THROMBECTOMY Right 2004   leg   THYROIDECTOMY  1950   Not sure if total or partial thyroidectomy.     FAMILY HISTORY   Family History  Problem Relation Age of Onset   Hypertension Mother    Heart disease Mother    CAD Father    Heart attack Father      SOCIAL HISTORY   Social History   Tobacco Use   Smoking status: Former    Packs/day: 1.00    Years: 46.00    Pack years: 46.00    Types: Cigarettes    Start date: 01/10/1957    Quit date: 02/11/2003    Years since quitting: 17.7   Smokeless tobacco: Former    Types: Snuff    Quit date: 02/11/2003  Vaping Use   Vaping Use: Never used  Substance Use Topics   Alcohol use: Not Currently    Alcohol/week: 7.0 standard drinks    Types: 7 Cans of beer per week   Drug use: No     MEDICATIONS    Home Medication:    Current Medication:  Current Facility-Administered Medications:    0.9 %  sodium chloride infusion, 250 mL, Intravenous, Continuous, Ouma, Bing Neighbors, NP, Last Rate: 20 mL/hr at 10/22/20 0637, Rate Verify at 10/22/20 6301   acetaminophen (TYLENOL) tablet 650 mg, 650 mg, Oral, Q6H PRN, Tu, Ching T, DO   albuterol (PROVENTIL) (2.5 MG/3ML) 0.083%  nebulizer solution 2.5 mg, 2.5 mg, Nebulization, Q4H PRN, Nolberto Hanlon, MD   apixaban (ELIQUIS) tablet 5 mg, 5 mg, Oral, BID, Tu, Ching T, DO, 5 mg at 10/27/20 0851   aspirin EC tablet 81 mg, 81 mg, Oral, QPC supper, Tu, Ching T, DO, 81 mg at 10/26/20 1858   atorvastatin (LIPITOR) tablet 40 mg, 40 mg, Oral, QPC supper, Tu, Ching T, DO, 40 mg at 10/26/20 1858   budesonide (PULMICORT) nebulizer solution 0.25 mg, 0.25 mg, Nebulization, BID, Ouma, Bing Neighbors, NP, 0.25 mg at 10/27/20 0758   famotidine (PEPCID) tablet 20 mg, 20 mg, Oral, Daily, Tu, Ching T, DO, 20 mg at 10/27/20 0850   ferrous gluconate (FERGON) tablet 324 mg, 324 mg, Oral, QPC supper, Tu,  Ching T, DO, 324 mg at 10/26/20 1857   guaiFENesin (MUCINEX) 12 hr tablet 600 mg, 600 mg, Oral, BID, Nolberto Hanlon, MD, 600 mg at 10/27/20 0851   guaiFENesin (ROBITUSSIN) 100 MG/5ML liquid 10 mL, 10 mL, Oral, Q4H PRN, Kurtis Bushman, Sahar, MD   ipratropium-albuterol (DUONEB) 0.5-2.5 (3) MG/3ML nebulizer solution 3 mL, 3 mL, Nebulization, BID, Kurtis Bushman, Sahar, MD   linezolid (ZYVOX) IVPB 600 mg, 600 mg, Intravenous, Q12H, Nolberto Hanlon, MD, Last Rate: 300 mL/hr at 10/27/20 0856, 600 mg at 10/27/20 0856   loratadine (CLARITIN) tablet 10 mg, 10 mg, Oral, Daily, Tu, Ching T, DO, 10 mg at 10/27/20 0851   methylPREDNISolone sodium succinate (SOLU-MEDROL) 125 mg/2 mL injection 80 mg, 80 mg, Intravenous, Q24H, Amery, Sahar, MD   metoprolol succinate (TOPROL-XL) 24 hr tablet 12.5 mg, 12.5 mg, Oral, BID, Kurtis Bushman, Sahar, MD   montelukast (SINGULAIR) tablet 10 mg, 10 mg, Oral, QHS, Tu, Ching T, DO, 10 mg at 10/26/20 2253   pantoprazole (PROTONIX) EC tablet 40 mg, 40 mg, Oral, QPC supper, Tu, Ching T, DO, 40 mg at 10/26/20 1857   phenazopyridine (PYRIDIUM) tablet 200 mg, 200 mg, Oral, TID PRN, Tu, Ching T, DO   tamsulosin (FLOMAX) capsule 0.4 mg, 0.4 mg, Oral, Daily, Tu, Ching T, DO, 0.4 mg at 10/27/20 0851    ALLERGIES   Hydromorphone     REVIEW OF SYSTEMS     Review of Systems:  Gen:  Denies  fever, sweats, chills weigh loss  HEENT: Denies blurred vision, double vision, ear pain, eye pain, hearing loss, nose bleeds, sore throat Cardiac:  No dizziness, chest pain or heaviness, chest tightness,edema Resp:   Denies cough or sputum porduction, shortness of breath,wheezing, hemoptysis,  Gi: Denies swallowing difficulty, stomach pain, nausea or vomiting, diarrhea, constipation, bowel incontinence Gu:  Denies bladder incontinence, burning urine Ext:   Denies Joint pain, stiffness or swelling Skin: Denies  skin rash, easy bruising or bleeding or hives Endoc:  Denies polyuria, polydipsia , polyphagia or weight change Psych:   Denies depression, insomnia or hallucinations   Other:  All other systems negative   VS: BP 115/76 (BP Location: Right Arm)   Pulse 64   Temp 98.7 F (37.1 C) (Oral)   Resp 20   Ht 6' 2" (1.88 m)   Wt 81.6 kg   SpO2 98%   BMI 23.11 kg/m      PHYSICAL EXAM    GENERAL:NAD, no fevers, chills, no weakness no fatigue HEAD: Normocephalic, atraumatic.  EYES: Pupils equal, round, reactive to light. Extraocular muscles intact. No scleral icterus.  MOUTH: Moist mucosal membrane. Dentition intact. No abscess noted.  EAR, NOSE, THROAT: Clear without exudates. No external lesions.  NECK: Supple. No thyromegaly. No nodules. No JVD.  PULMONARY: bilateral rhonchi CARDIOVASCULAR: S1 and S2. Irregular rate and rhythm. No murmurs, rubs, or gallops. No edema. Pedal pulses 2+ bilaterally.  GASTROINTESTINAL: Soft, nontender, nondistended. No masses. Positive bowel sounds. No hepatosplenomegaly.  MUSCULOSKELETAL: No swelling, clubbing, or edema. Range of motion full in all extremities.  NEUROLOGIC: Cranial nerves II through XII are intact. No gross focal neurological deficits. Sensation intact. Reflexes intact.  SKIN: No ulceration, lesions, rashes, or cyanosis. Skin warm and dry. Turgor intact.  PSYCHIATRIC: Mood, affect within  normal limits. The patient is awake, alert and oriented x 3. Insight, judgment intact.       IMAGING        DG Chest 2 View  Result Date: 10/06/2020 CLINICAL DATA:  Shortness of breath.  EXAM: CHEST - 2 VIEW COMPARISON:  September 27, 2020. FINDINGS: The heart size and mediastinal contours are within normal limits. Stable reticular densities are noted throughout both lungs consistent with scarring or fibrosis, but acute superimposed inflammation can not be excluded. The visualized skeletal structures are unremarkable. IMPRESSION: Stable reticular densities are noted throughout both lungs consistent with scarring or fibrosis, but acute superimposed inflammation can not be excluded. Electronically Signed   By: Marijo Conception M.D.   On: 10/06/2020 16:54   DG Abdomen 1 View  Result Date: 10/20/2020 CLINICAL DATA:  Sepsis.  Evaluate for stent placement. EXAM: ABDOMEN - 1 VIEW COMPARISON:  Fluoroscopy 10/16/2020 FINDINGS: A left ureteral stent is present. The proximal pigtail projects to the left of L3. This is lower than on the previous fluoroscopic image. The distal pigtail projects over the perineum below the level of the symphysis pubis. This suggests inferior migration of the stent with the distal pigtail probably either in the urethra or possibly in a prolapsed bladder. Scattered gas and stool throughout the colon and small bowel. No small or large bowel distention. No radiopaque stones are identified. Degenerative changes in the spine and hips. IMPRESSION: Left ureteral stent is present. The stent appears to have migrated inferiorly with the proximal pigtail lower than on prior study and the distal pigtail projecting below the level of the symphysis pubis, possibly in the urethra or in a prolapsed bladder. Electronically Signed   By: Lucienne Capers M.D.   On: 10/20/2020 20:18   DG Chest Port 1 View  Result Date: 10/27/2020 CLINICAL DATA:  Chronic hypoxemia, COPD presents with weakness and  shortness of breath EXAM: PORTABLE CHEST 1 VIEW COMPARISON:  Chest radiograph 10/23/2020 FINDINGS: The cardiomediastinal silhouette is stable. There are diffusely coarsened interstitial markings throughout both lungs, overall not significantly changed since 10/23/2020. There is no new or worsening focal airspace disease. There is no pleural effusion. There is no pneumothorax. There is no acute osseous abnormality. IMPRESSION: Unchanged coarsened interstitial markings throughout both lungs since 10/23/2020 though these are increased since 10/06/2020. Findings may reflect edema or infection superimposed on chronic changes. Electronically Signed   By: Valetta Mole M.D.   On: 10/27/2020 09:05   DG Chest Port 1 View  Result Date: 10/23/2020 CLINICAL DATA:  Respiratory failure, increasing shortness of breath EXAM: PORTABLE CHEST 1 VIEW COMPARISON:  10/20/2020 FINDINGS: Unchanged mildly enlarged cardiac contour. Redemonstrated diffuse airspace and interstitial opacities bilaterally, which appear largely unchanged compared to the prior exam. No definite pleural effusion. Aortic calcifications. No acute osseous abnormality. IMPRESSION: Unchanged bilateral pulmonary opacities, which could represent edema or infection. Electronically Signed   By: Merilyn Baba M.D.   On: 10/23/2020 17:49   DG Chest Port 1 View  Result Date: 10/20/2020 CLINICAL DATA:  Questionable sepsis.  Evaluate for abnormality. EXAM: PORTABLE CHEST 1 VIEW COMPARISON:  10/06/2020 FINDINGS: Mild cardiac enlargement. Diffuse airspace and interstitial infiltration throughout both lungs, progressing since prior study. This may represent edema or multifocal pneumonia. No pleural effusions. No pneumothorax. Mediastinal contours appear intact. Calcification of the aorta. Degenerative changes in the spine and shoulders. IMPRESSION: Increasing bilateral pulmonary infiltrates, likely edema or pneumonia. Electronically Signed   By: Lucienne Capers M.D.   On:  10/20/2020 19:08   DG OR UROLOGY CYSTO IMAGE ([facility] ONLY)  Result Date: 10/16/2020 There is no interpretation for this exam.  This order is for images obtained during a surgical procedure.  Please See "Surgeries" Tab for more information regarding the  procedure.   ECHOCARDIOGRAM COMPLETE  Result Date: 10/23/2020    ECHOCARDIOGRAM REPORT   Patient Name:   DAYLEN LIPSKY Date of Exam: 10/23/2020 Medical Rec #:  [id number]     Height:       74.0 in Accession #:    [id number]    Weight:       180.0 lb Date of Birth:  03/28/1940     BSA:          2.078 m Patient Age:    59 years      BP:           133/80 mmHg Patient Gender: M             HR:           95 bpm. Exam Location:  [facility] Procedure: 2D Echo, Cardiac Doppler and Color Doppler Indications:     CHF-acute diastolic V78.46  History:         Patient has prior history of Echocardiogram examinations, most                  recent 04/20/2015. Angina, COPD and TIA; Signs/Symptoms:Syncope.  Sonographer:     Sherrie Sport Referring Phys:  [id number] [place] Diagnosing Phys: Ida Rogue MD  Sonographer Comments: Suboptimal apical window. IMPRESSIONS  1. Left ventricular ejection fraction, by estimation, is 60 to 65%. The left ventricle has normal function. The left ventricle has no regional wall motion abnormalities. Left ventricular diastolic parameters are indeterminate.  2. Right ventricular systolic function is normal. The right ventricular size is normal. There is mildly elevated pulmonary artery systolic pressure.  3. Rhythm is atrial fibrillation  4. Left atrial size was moderately dilated. FINDINGS  Left Ventricle: Left ventricular ejection fraction, by estimation, is 60 to 65%. The left ventricle has normal function. The left ventricle has no regional wall motion abnormalities. The left ventricular internal cavity size was normal in size. There is  no left ventricular hypertrophy. Left ventricular diastolic parameters are indeterminate. Right Ventricle: The  right ventricular size is normal. No increase in right ventricular wall thickness. Right ventricular systolic function is normal. There is mildly elevated pulmonary artery systolic pressure. The tricuspid regurgitant velocity is 2.95  m/s, and with an assumed right atrial pressure of 5 mmHg, the estimated right ventricular systolic pressure is 41.3 mmHg. Left Atrium: Left atrial size was moderately dilated. Right Atrium: Right atrial size was normal in size. Pericardium: There is no evidence of pericardial effusion. Mitral Valve: The mitral valve is normal in structure. There is mild thickening of the mitral valve leaflet(s). No evidence of mitral valve regurgitation. No evidence of mitral valve stenosis. Tricuspid Valve: The tricuspid valve is normal in structure. Tricuspid valve regurgitation is mild . No evidence of tricuspid stenosis. Aortic Valve: The aortic valve is normal in structure. Aortic valve regurgitation is not visualized. No aortic stenosis is present. Aortic valve mean gradient measures 2.0 mmHg. Aortic valve peak gradient measures 4.7 mmHg. Aortic valve area, by VTI measures 3.04 cm. Pulmonic Valve: The pulmonic valve was normal in structure. Pulmonic valve regurgitation is not visualized. No evidence of pulmonic stenosis. Aorta: The aortic root is normal in size and structure. Venous: The inferior vena cava is normal in size with greater than 50% respiratory variability, suggesting right atrial pressure of 3 mmHg. IAS/Shunts: No atrial level shunt detected by color flow Doppler.  LEFT VENTRICLE PLAX 2D LVIDd:         4.19 cm LVIDs:  2.93 cm LV PW:         1.33 cm LV IVS:        0.95 cm LVOT diam:     2.00 cm LV SV:         55 LV SV Index:   26 LVOT Area:     3.14 cm  RIGHT VENTRICLE RV Basal diam:  4.55 cm RV S prime:     7.51 cm/s TAPSE (M-mode): 3.8 cm LEFT ATRIUM              Index        RIGHT ATRIUM           Index LA diam:        3.50 cm  1.68 cm/m   RA Area:     26.60 cm LA Vol  (A2C):   93.4 ml  44.94 ml/m  RA Volume:   96.30 ml  46.34 ml/m LA Vol (A4C):   128.0 ml 61.59 ml/m LA Biplane Vol: 113.0 ml 54.37 ml/m  AORTIC VALVE                    PULMONIC VALVE AV Area (Vmax):    2.83 cm     PV Vmax:        0.96 m/s AV Area (Vmean):   2.84 cm     PV Peak grad:   3.6 mmHg AV Area (VTI):     3.04 cm     RVOT Peak grad: 4 mmHg AV Vmax:           108.00 cm/s AV Vmean:          70.100 cm/s AV VTI:            0.181 m AV Peak Grad:      4.7 mmHg AV Mean Grad:      2.0 mmHg LVOT Vmax:         97.30 cm/s LVOT Vmean:        63.300 cm/s LVOT VTI:          0.175 m LVOT/AV VTI ratio: 0.97  AORTA Ao Root diam: 3.30 cm MITRAL VALVE                TRICUSPID VALVE MV Area (PHT): 4.52 cm     TR Peak grad:   34.8 mmHg MV Decel Time: 168 msec     TR Vmax:        295.00 cm/s MV E velocity: 134.00 cm/s                             SHUNTS                             Systemic VTI:  0.18 m                             Systemic Diam: 2.00 cm Ida Rogue MD Electronically signed by Ida Rogue MD Signature Date/Time: 10/23/2020/5:30:24 PM    Final    CT Renal Stone Study  Result Date: 10/20/2020 CLINICAL DATA:  Intermittent weakness, feeling as if he would faint. EXAM: CT ABDOMEN AND PELVIS WITHOUT CONTRAST TECHNIQUE: Multidetector CT imaging of the abdomen and pelvis was performed following the standard protocol without IV contrast. COMPARISON:  September 18, 2020 FINDINGS: Lower chest: Moderate severity chronic appearing fibrotic  changes are seen involving the bilateral lung bases. Small bilateral pleural effusions are noted. Hepatobiliary: No focal liver abnormality is seen. No gallstones, gallbladder wall thickening, or biliary dilatation. Pancreas: Unremarkable. No pancreatic ductal dilatation or surrounding inflammatory changes. Spleen: A punctate calcified granuloma is seen within an otherwise normal-appearing spleen. Adrenals/Urinary Tract: Adrenal glands are unremarkable. Kidneys are normal in  size, without focal lesions. A 2 mm nonobstructing renal stone is seen within the upper pole of the right kidney. Clusters of subcentimeter nonobstructing renal stones are seen within the mid and lower left kidney. A left-sided endo ureteral stent is in place. The proximal portion of the stent is seen within the proximal left ureter, just beyond the left UPJ. The distal end extends into the urinary bladder, continues through the prostate gland in serpentine fashion to the proximal to mid portion of the urethra (axial CT image 97, CT series 2). The urinary bladder is empty and subsequently limited in evaluation. Stomach/Bowel: Stomach is within normal limits. Appendix appears normal. No evidence of bowel dilatation. Numerous diverticula are seen within the descending and sigmoid colon. Vascular/Lymphatic: Aortic atherosclerosis. No enlarged abdominal or pelvic lymph nodes. Reproductive: The prostate gland is moderately enlarged. A moderate amount of prostate gland calcification is seen. Other: A 4.4 cm x 2.7 cm fat containing right inguinal hernia is seen. No abdominopelvic ascites. Musculoskeletal: Multilevel degenerative changes seen throughout the lumbar spine. IMPRESSION: 1. Left-sided endo ureteral stent which may be malpositioned, as described above. Confirmation of the desired positioning is recommended. 2. Bilateral nonobstructing renal stones. 3. Small bilateral pleural effusions. 4. Colonic diverticulosis. Aortic Atherosclerosis (ICD10-I70.0). Electronically Signed   By: Virgina Norfolk M.D.   On: 10/20/2020 21:54      ASSESSMENT/PLAN   Acute on chronic hypoxemic respiratory failure - present on admission  - COVID19 negative  -MRSA nasal PCR -procalcitonin trend - supplemental O2 during my evaluation 3L/min - will perform infectious workup for pneumonia -Respiratory viral panel -serum fungitell -legionella ab -strep pneumoniae ur AG -Histoplasma Ur Ag -sputum resp cultures -AFB sputum  expectorated specimen -sputum cytology  -reviewed pertinent imaging with patient today - ESR -PT/OT for d/c planning  -please encourage patient to use incentive spirometer few times each hour while hospitalized.   -agree with zyvox -adding lasix 20 bid IV -reducing solumedrol to 60 iv daily       Thank you for allowing me to participate in the care of this patient.  Total face to face encounter time for this patient visit was >73mn. >50% of the time was  spent in counseling and coordination of care.   Patient/Family are satisfied with care plan and all questions have been answered.  This document was prepared using Dragon voice recognition software and may include unintentional dictation errors.     FOttie Glazier M.D.  Division of [facility]

## 2020-10-27 NOTE — Progress Notes (Signed)
Other PROGRESS NOTE    [name] Browning  YDX:[id number] DOB: 15-Feb-1940 DOA: 10/20/2020 PCP: [place]    Brief Narrative:  [name] Browning is a 80 y.o. male with medical history significant for COPD with chronic hypoxemia on 2 L, paroxysmal atrial fibrillation on Eliquis, hypertension, TIA and recent nephrolithiasis s/p ureteral stent who presents with concerns of weakness and increasing shortness of breath.   Patient recently hospitalized in September for abdominal pain and shortness of breath.  He was found to have COPD with pneumonia.  He also had obstructing left proximal ureteral stone with left-sided hydronephrosis, AKI and underwent left ureteral stent by urology on 9/11.  Subsequently following procedure he developed interstitial edema and hypotension requiring ICU/SDM BiPAP.   Patient reports that since returning home from this admission he has not felt back to baseline.  Has continue progression of shortness of breath and could barely walk down the hall at home.  Has been feeling weak.  Has cough.  Daughter notes temperature of 103F day. When he presented to the ER they placed him on nonrebreather.  He was treated empirically for pneumonia and also for UTI. CT renal stone search shows bilateral mild pleural effusion and possible malpositioning of urethral stent.  Urology was consulted and came and remove the stent.  He was initially seen placed on Levophed on admission and then transitioned off.  He was receiving IV antibiotics.  Was started on IV steroids. Unfortunately every time we take him off of IV steroids put him on p.o. he starts having more wheezing and not doing well.  Also received couple of small doses of Lasix during hospitalization and he was euvolemic.  10/18 PCCM consulted today. Pt feels sob with ambulation and wheezing. More coughing. Cxr obtained.   Consultants:  PCCM  Procedures:   Antimicrobials:   Cefepime and vanco...>on zyvox  Subjective: Coughing,  doe. Placed on 5L this am. No cp  Objective: Vitals:   10/27/20 0316 10/27/20 0727 10/27/20 0758 10/27/20 1100  BP: 122/77 (!) 115/57  115/76  Pulse: 80 62 64 64  Resp: 18 20 18 20   Temp: 98.2 F (36.8 C) 98.7 F (37.1 C)  98.7 F (37.1 C)  TempSrc:    Oral  SpO2: 98% 98% 96% 98%  Weight:      Height:        Intake/Output Summary (Last 24 hours) at 10/27/2020 1428 Last data filed at 10/27/2020 1340 Gross per 24 hour  Intake 2140 ml  Output 3100 ml  Net -960 ml   Filed Weights   10/20/20 1722  Weight: 81.6 kg    Examination: Nad, calm Coughing the exam Expiratory wheezing, sounds junky rhonchorous Regular S1-S2 no gallops Soft benign +bs No edema Aaxoxo3 grossly intact   Data Reviewed: I have personally reviewed following labs and imaging studies  CBC: Recent Labs  Lab 10/20/20 1733 10/21/20 0632 10/22/20 0624 10/23/20 0926 10/26/20 0831  WBC 9.8 8.2 8.0 9.6 11.1*  HGB 10.8* 9.2* 9.8* 10.8* 10.3*  HCT 32.4* 28.8* 30.5* 33.7* 31.6*  MCV 94.5 98.0 93.8 96.8 94.6  PLT 147* 128* 145* 155 720   Basic Metabolic Panel: Recent Labs  Lab 10/21/20 0632 10/22/20 0624 10/23/20 0450 10/25/20 0543 10/26/20 0436 10/27/20 0044  NA 137 137 136 137 135  --   K 3.7 4.4 4.0 4.1 3.8  --   CL 103 108 107 106 103  --   CO2 26 24 25 27 27   --  GLUCOSE 113* 146* 108* 176* 139*  --   BUN 18 18 31* 40* 43*  --   CREATININE 0.96 0.75 1.08 0.93 1.24 1.05  CALCIUM 7.7* 8.4* 8.2* 8.2* 8.5*  --   MG  --   --   --   --   --  2.1   GFR: Estimated Creatinine Clearance: 65.8 mL/min (by C-G formula based on SCr of 1.05 mg/dL). Liver Function Tests: Recent Labs  Lab 10/20/20 1733  AST 22  ALT 23  ALKPHOS 85  BILITOT 1.3*  PROT 5.9*  ALBUMIN 2.9*   No results for input(s): LIPASE, AMYLASE in the last 168 hours. No results for input(s): AMMONIA in the last 168 hours. Coagulation Profile: Recent Labs  Lab 10/20/20 2020  INR 1.5*   Cardiac Enzymes: No results  for input(s): CKTOTAL, CKMB, CKMBINDEX, TROPONINI in the last 168 hours. BNP (last 3 results) No results for input(s): PROBNP in the last 8760 hours. HbA1C: No results for input(s): HGBA1C in the last 72 hours. CBG: No results for input(s): GLUCAP in the last 168 hours. Lipid Profile: No results for input(s): CHOL, HDL, LDLCALC, TRIG, CHOLHDL, LDLDIRECT in the last 72 hours. Thyroid Function Tests: No results for input(s): TSH, T4TOTAL, FREET4, T3FREE, THYROIDAB in the last 72 hours. Anemia Panel: No results for input(s): VITAMINB12, FOLATE, FERRITIN, TIBC, IRON, RETICCTPCT in the last 72 hours. Sepsis Labs: Recent Labs  Lab 10/20/20 2020 10/21/20 8185 10/21/20 0744 10/22/20 0624 10/23/20 0450  PROCALCITON  --  <0.10  --  <0.10 <0.10  LATICACIDVEN 1.0 1.8 1.7  --   --     Recent Results (from the past 240 hour(s))  Resp Panel by RT-PCR (Flu A&B, Covid) Nasopharyngeal Swab     Status: None   Collection Time: 10/20/20  5:33 PM   Specimen: Nasopharyngeal Swab; Nasopharyngeal(NP) swabs in vial transport medium  Result Value Ref Range Status   SARS Coronavirus 2 by RT PCR NEGATIVE NEGATIVE Final    Comment: (NOTE) SARS-CoV-2 target nucleic acids are NOT DETECTED.  The SARS-CoV-2 RNA is generally detectable in upper respiratory specimens during the acute phase of infection. The lowest concentration of SARS-CoV-2 viral copies this assay can detect is 138 copies/mL. A negative result does not preclude SARS-Cov-2 infection and should not be used as the sole basis for treatment or other patient management decisions. A negative result may occur with  improper specimen collection/handling, submission of specimen other than nasopharyngeal swab, presence of viral mutation(s) within the areas targeted by this assay, and inadequate number of viral copies(<138 copies/mL). A negative result must be combined with clinical observations, patient history, and epidemiological information. The  expected result is Negative.  Fact Sheet for Patients:  EntrepreneurPulse.com.au  Fact Sheet for Healthcare Providers:  IncredibleEmployment.be  This test is no t yet approved or cleared by the [place] FDA and  has been authorized for detection and/or diagnosis of SARS-CoV-2 by FDA under an Emergency Use Authorization (EUA). This EUA will remain  in effect (meaning this test can be used) for the duration of the COVID-19 declaration under Section 564(b)(1) of the Act, 21 U.S.C.section 360bbb-3(b)(1), unless the authorization is terminated  or revoked sooner.       Influenza A by PCR NEGATIVE NEGATIVE Final   Influenza B by PCR NEGATIVE NEGATIVE Final    Comment: (NOTE) The Xpert Xpress SARS-CoV-2/FLU/RSV plus assay is intended as an aid in the diagnosis of influenza from Nasopharyngeal swab specimens and should not be used as  a sole basis for treatment. Nasal washings and aspirates are unacceptable for Xpert Xpress SARS-CoV-2/FLU/RSV testing.  Fact Sheet for Patients: EntrepreneurPulse.com.au  Fact Sheet for Healthcare Providers: IncredibleEmployment.be  This test is not yet approved or cleared by the [place] FDA and has been authorized for detection and/or diagnosis of SARS-CoV-2 by FDA under an Emergency Use Authorization (EUA). This EUA will remain in effect (meaning this test can be used) for the duration of the COVID-19 declaration under Section 564(b)(1) of the Act, 21 U.S.C. section 360bbb-3(b)(1), unless the authorization is terminated or revoked.  Performed at [facility], [address] [place], [place] [address]   Blood Culture (routine x 2)     Status: None   Collection Time: 10/20/20  5:58 PM   Specimen: BLOOD  Result Value Ref Range Status   Specimen Description BLOOD BLOOD LEFT FOREARM  Final   Special Requests   Final    BOTTLES DRAWN AEROBIC AND ANAEROBIC  Blood Culture results may not be optimal due to an excessive volume of blood received in culture bottles   Culture   Final    NO GROWTH 5 DAYS Performed at [facility], [address] [place], [place] [address]    Report Status 10/25/2020 FINAL  Final  Blood Culture (routine x 2)     Status: None   Collection Time: 10/20/20  5:58 PM   Specimen: BLOOD  Result Value Ref Range Status   Specimen Description BLOOD RIGHT ANTECUBITAL  Final   Special Requests   Final    BOTTLES DRAWN AEROBIC AND ANAEROBIC Blood Culture results may not be optimal due to an excessive volume of blood received in culture bottles   Culture   Final    NO GROWTH 5 DAYS Performed at [facility], [address] [place], [place] [address]    Report Status 10/25/2020 FINAL  Final  Urine Culture     Status: Abnormal   Collection Time: 10/20/20  8:20 PM   Specimen: Urine, Clean Catch  Result Value Ref Range Status   Specimen Description   Final    URINE, CLEAN CATCH Performed at [facility], [address], [place], [place] [address]    Special Requests   Final    NONE Performed at [facility], [address] [place], [place] [address]    Culture (A)  Final    >=100,000 COLONIES/mL STAPHYLOCOCCUS HAEMOLYTICUS >=100,000 COLONIES/mL VANCOMYCIN RESISTANT ENTEROCOCCUS    Report Status 10/24/2020 FINAL  Final   Organism ID, Bacteria STAPHYLOCOCCUS HAEMOLYTICUS (A)  Final   Organism ID, Bacteria VANCOMYCIN RESISTANT ENTEROCOCCUS (A)  Final      Susceptibility   Staphylococcus haemolyticus - MIC*    CIPROFLOXACIN >=8 RESISTANT Resistant     GENTAMICIN >=16 RESISTANT Resistant     NITROFURANTOIN <=16 SENSITIVE Sensitive     OXACILLIN >=4 RESISTANT Resistant     TETRACYCLINE 2 SENSITIVE Sensitive     VANCOMYCIN 1 SENSITIVE Sensitive     TRIMETH/SULFA >=320 RESISTANT Resistant     CLINDAMYCIN >=8 RESISTANT Resistant     RIFAMPIN >=32 RESISTANT Resistant     Inducible  Clindamycin NEGATIVE Sensitive     * >=100,000 COLONIES/mL STAPHYLOCOCCUS HAEMOLYTICUS   Vancomycin resistant enterococcus - MIC*    AMPICILLIN >=32 RESISTANT Resistant     NITROFURANTOIN 64 INTERMEDIATE Intermediate     VANCOMYCIN >=32 RESISTANT Resistant     LINEZOLID 2 SENSITIVE Sensitive     * >=100,000 COLONIES/mL VANCOMYCIN RESISTANT  ENTEROCOCCUS  Expectorated Sputum Assessment w Gram Stain, Rflx to Resp Cult     Status: None   Collection Time: 10/22/20  9:10 AM   Specimen: Expectorated Sputum  Result Value Ref Range Status   Specimen Description EXPECTORATED SPUTUM  Final   Special Requests NONE  Final   Sputum evaluation   Final    THIS SPECIMEN IS ACCEPTABLE FOR SPUTUM CULTURE Performed at [facility], [address] [place], [place] [address]    Report Status 10/22/2020 FINAL  Final  Culture, Respiratory w Gram Stain     Status: None   Collection Time: 10/22/20  9:10 AM  Result Value Ref Range Status   Specimen Description   Final    EXPECTORATED SPUTUM Performed at [facility], [address] [place], [place] [address]    Special Requests   Final    NONE Reflexed from [id number] Performed at [facility], [place], [place] [address]    Gram Stain   Final    MODERATE WBC PRESENT,BOTH PMN AND MONONUCLEAR RARE GRAM NEGATIVE RODS RARE GRAM POSITIVE COCCI    Culture   Final    FEW Normal respiratory flora-no Staph aureus or Pseudomonas seen Performed at [facility], [place] [address], [place], [place] [address]    Report Status 10/24/2020 FINAL  Final         Radiology Studies: DG Chest Port 1 View  Result Date: 10/27/2020 CLINICAL DATA:  Chronic hypoxemia, COPD presents with weakness and shortness of breath EXAM: PORTABLE CHEST 1 VIEW COMPARISON:  Chest radiograph 10/23/2020 FINDINGS: The cardiomediastinal silhouette is stable. There are diffusely coarsened interstitial markings throughout both lungs, overall  not significantly changed since 10/23/2020. There is no new or worsening focal airspace disease. There is no pleural effusion. There is no pneumothorax. There is no acute osseous abnormality. IMPRESSION: Unchanged coarsened interstitial markings throughout both lungs since 10/23/2020 though these are increased since 10/06/2020. Findings may reflect edema or infection superimposed on chronic changes. Electronically Signed   By: Valetta Mole M.D.   On: 10/27/2020 09:05        Scheduled Meds:  apixaban  5 mg Oral BID   aspirin EC  81 mg Oral QPC supper   atorvastatin  40 mg Oral QPC supper   budesonide (PULMICORT) nebulizer solution  0.25 mg Nebulization BID   famotidine  20 mg Oral Daily   ferrous gluconate  324 mg Oral QPC supper   guaiFENesin  600 mg Oral BID   ipratropium-albuterol  3 mL Nebulization BID   loratadine  10 mg Oral Daily   methylPREDNISolone (SOLU-MEDROL) injection  40 mg Intravenous Q12H   metoprolol succinate  25 mg Oral BID   montelukast  10 mg Oral QHS   pantoprazole  40 mg Oral QPC supper   tamsulosin  0.4 mg Oral Daily   Continuous Infusions:  sodium chloride 20 mL/hr at 10/22/20 6644   linezolid (ZYVOX) IV 600 mg (10/27/20 0856)    Assessment & Plan:   Principal Problem:   Acute on chronic respiratory failure with hypoxia (HCC) Active Problems:   History of TIA (transient ischemic attack)   Hyperlipidemia   AF (paroxysmal atrial fibrillation) (HCC)   Urinary tract obstruction by kidney stone   Community acquired pneumonia   Sepsis ([place])   Hypotension   Septic shock ([place])   Acute on chronic hypoxemic respiratory failure secondary to community-acquired pneumonia versus acute diastolic HF - Patient baseline on 2  L.  He was discharged on this after COPD exacerbation in September.  Now presenting with tachypnea with respiratory rate of 24, hypoxic with ambulation and exertion requiring up to 4 L  Echo EF normal 10/18 was treated with lasix 20mg  iv x2 ,  more euvolemic Was on iv abx. Was on p.o. steroids however he is very wheezy and rhonchorous on exam today we will need to resume Solu-Medrol 40 mg IV twice daily Continue inhalers Continue I-S and flutter valve Will consult PCCM       Septic shock secondary to community-acquired pneumonia versus and UTI UCX + staph. Haemol. And E. Enteroc.face Off levophed on 10/12.  blood cultures today negative Urine culture with Enterococcus fae. 10/18 continue Zyvox will need to complete 6 more days he was septic.      UTI +ucx for staph haemolyticus and enteroco. Faecium Complicated UTI 58/83 continue Zyvox for 6 more days since urine culture with resistant to vancomycin    Paroxysmal atrial fibrillation Now with rapid ventricular response 10/18 rate controlled however his more on bradycardia side.  We will decrease his Toprol-XL to 12.5 mg twice daily Continue Eliquis     Thrombocytopenia Likely due to infection Now improving  Recent obstructing left ureteral stone with left hydronephrosis - Creatinine currently stable - Patient had left ureteral stent placed on 9/11 by Dr. Abner Greenspan - 10/7-underwent ureteroscopy, lithotripsy  -CT renal stone shows possible distal migration of the ureteral stent.  Will need to discuss with urology in the morning but doubt any GU infection needing urgent intervention since he has negative UA, no GI symptoms and benign abdominal exam. -continue Tamsulosin 10/12- will consult urology for malposition of stent urologist input was appreciated.  His urethral stent was removed.  Will need to follow-up outpatient with urology in 6 months with KUB prior for stone monitoring    History of TIA Continue aspirin   Hyperlipidemia -continues statin   DVT prophylaxis: Eliquis Code Status: Full Family Communication: None at bedside Disposition Plan:  Status is: Inpatient  Remains inpatient appropriate because:Inpatient level of care appropriate due to  severity of illness  Dispo: The patient is from: Home              Anticipated d/c is to: Home              Patient currently is not medically stable to d/c.   Difficult to place patient No   Requires IV treatment         LOS: 7 days   Time spent: 35 minutes with more than 50% on [place], MD [facility] Pager [phone]  If 7PM-7AM, please contact night-coverage 10/27/2020, 2:28 PM

## 2020-10-28 DIAGNOSIS — J9621 Acute and chronic respiratory failure with hypoxia: Secondary | ICD-10-CM | POA: Diagnosis not present

## 2020-10-28 LAB — BASIC METABOLIC PANEL
Anion gap: 9 (ref 5–15)
BUN: 42 mg/dL — ABNORMAL HIGH (ref 8–23)
CO2: 26 mmol/L (ref 22–32)
Calcium: 8.7 mg/dL — ABNORMAL LOW (ref 8.9–10.3)
Chloride: 101 mmol/L (ref 98–111)
Creatinine, Ser: 1.17 mg/dL (ref 0.61–1.24)
GFR, Estimated: >60 mL/min (ref >60)
Glucose, Bld: 201 mg/dL — ABNORMAL HIGH (ref 70–99)
Potassium: 4.2 mmol/L (ref 3.5–5.1)
Sodium: 136 mmol/L (ref 135–145)

## 2020-10-28 LAB — STREP PNEUMONIAE URINARY ANTIGEN: Strep Pneumo Urinary Antigen: NEGATIVE

## 2020-10-28 LAB — C-REACTIVE PROTEIN: CRP: 0.6 mg/dL (ref <1.0)

## 2020-10-28 LAB — PROCALCITONIN: Procalcitonin: <0.1 ng/mL

## 2020-10-28 LAB — MAGNESIUM: Magnesium: 2 mg/dL (ref 1.7–2.4)

## 2020-10-28 LAB — MRSA NEXT GEN BY PCR, NASAL: MRSA by PCR Next Gen: NOT DETECTED

## 2020-10-28 MED ORDER — FUROSEMIDE 10 MG/ML IJ SOLN
20.0000 mg | Freq: Two times a day (BID) | INTRAMUSCULAR | Status: DC
  Administered 2020-10-28 – 2020-10-29 (×3): 20 mg via INTRAVENOUS
  Filled 2020-10-28 (×3): qty 2

## 2020-10-28 MED ORDER — METHYLPREDNISOLONE SODIUM SUCC 125 MG IJ SOLR
60.0000 mg | INTRAMUSCULAR | Status: DC
  Administered 2020-10-28: 60 mg via INTRAVENOUS
  Filled 2020-10-28: qty 2

## 2020-10-28 NOTE — Progress Notes (Signed)
Mobility Specialist - Progress Note   10/28/20 1100  Mobility  Activity Ambulated in hall;Ambulated to bathroom;Transferred:  Bed to chair  Level of Assistance Standby assist, set-up cues, supervision of patient - no hands on  Assistive Device Front wheel walker  Distance Ambulated (ft) 140 ft  Mobility Ambulated with assistance in hallway;Out of bed for toileting;Out of bed to chair with meals  Mobility Response Tolerated well  Mobility performed by Mobility specialist  $Mobility charge 1 Mobility    Pre-mobility: 94% SpO2 During mobility: 115 HR, 91% SpO2 Post-mobility: 95 HR, 97% SpO2   Pt lying in bed upon arrival, utilizing 3L. MinA to stand to RW. Pt ambulated to bathroom for BM prior to continuation of activity. Ambulated in hallway with supervision and CGA. After ~35' into hallway, pt voiced LE weakness and begins to stagger backwards several times during return distance but no LOB. 3 short standing rest breaks taken. Mildly fatigued with activity. Pt returned to recliner with needs in reach.    Kathee Delton Mobility Specialist 10/28/20, 11:50 AM

## 2020-10-28 NOTE — Progress Notes (Signed)
Pulmonary Medicine          Date: 10/28/2020,   MRN# [id number] [name] Browning 25-Jan-1940     AdmissionWeight: 81.6 kg                 CurrentWeight: 81.6 kg  Referring physician: Dr Kurtis Bushman     CHIEF COMPLAINT:   Acute on chronic hypoxemic respiratory failure    HISTORY OF PRESENT ILLNESS   [name] Browning is a 80 y.o. male with medical history significant for COPD with chronic hypoxemia on 2 L, paroxysmal atrial fibrillation on Eliquis, hypertension, TIA and recent nephrolithiasis s/p ureteral stent who presents with concerns of weakness and increasing shortness of breath. Patient recently hospitalized in September 2022 for abdominal pain and shortness of breath.  He was found to have COPD with pneumonia.  He also had obstructing left proximal ureteral stone with left-sided hydronephrosis, AKI and underwent left ureteral stent by urology on 9/11.  Subsequently following procedure he developed interstitial edema and hypotension requiring ICU/SDM BiPAP.Patient reports that since returning home from this admission he has not felt back to baseline.  Has continue progression of shortness of breath and could barely walk down the hall at home.  Has been feeling weak.  Has cough.  Daughter notes temperature of 103F day.In the ED, he was febrile up to 101.1, hypotensive down to 80s over 50s, tachycardic heart rate of 109, tachypneic RR of 24 and was on nonrebreather with 4L at the time my evaluation.CBC showed no leukocytosis, hemoglobin stable at 10.8, platelet 147, sodium 139, potassium 3.9, BG of 110. UA shows moderate leukocyte, negative nitrite but no bacteria. Chest x-ray with bilateral infiltrate edema versus pneumonia.  PCCM consult for additional evaluation and management CXR reviewed by me and is + for edema bilaterally. He has 2+ peripheral edema.   I SPOKE WITH [name] next of kin for patient and we reviewed medical plan and hospital course  10/28/20-  patient stable , no  new complaints no acute events overnight conitnue current care plan.   PAST MEDICAL HISTORY   Past Medical History:  Diagnosis Date   Anginal pain ([place])    Anxiety    Aortic atherosclerosis ([place])    Atrial fibrillation ([place]) 01/2019   Bilateral carpal tunnel syndrome    CAD (coronary artery disease)    a.) LHC 2004 --> normal coronaries. b.) normal stress test in 2007 and 2011; c.) Lexiscan 05/20/2014 --> LVEF 55-65%; no significant stress induced ischemia/arrythmia. d.) CT chest 03/25/2019 --> coronaries carcified.   Carotid atherosclerosis, bilateral    Carpal tunnel syndrome, bilateral    Chronic anticoagulation    a.) ASA + apixaban   COPD (chronic obstructive pulmonary disease) (HCC)    CVA (cerebral vascular accident) ([place])    Degenerative disc disease, cervical    Diastolic dysfunction    a.) TTE 05/29/2014 --> LVEF 60-65%; G1DD.   DVT (deep venous thrombosis) (HCC)    GERD (gastroesophageal reflux disease)    History of 2019 novel coronavirus disease (COVID-19) 02/08/2019   HLD (hyperlipidemia)    Hypertension    Kidney stones    Osteoarthritis of right shoulder    Pneumonia    Respiratory failure, acute ([place]) 09/20/2020   a.) severe respiratory distress 1 hour after urological surgery. CXR (+) for acute pulmonary edema. Transferred to ICU and placed on NIPPV. Questionable aspiration PNA. (+) A.fib with RVR. Improved with ABX, diuresis, and amiodarone.   Skin cancer of face  a.) RIGHT ear and RIGHT forehead; excised.   Syncope    TIA (transient ischemic attack) 2016   Valvular regurgitation    a.) TTE 05/26/2014 --> LVEF 60-65%; trivial MR, mild TR; no AR or PR. b.) TTE 04/20/2015 --> LVEF 55-60%; trivial MR and PR; no AR or TR.     SURGICAL HISTORY   Past Surgical History:  Procedure Laterality Date   CARDIAC CATHETERIZATION  2004   CARPAL TUNNEL RELEASE Right 06/11/2013   CYSTOSCOPY W/ RETROGRADES  10/16/2020   Procedure: CYSTOSCOPY WITH RETROGRADE PYELOGRAM;   Surgeon: Billey Co, MD;  Location: [facility] ORS;  Service: Urology;;   CYSTOSCOPY W/ URETERAL STENT PLACEMENT Left 09/20/2020   Procedure: CYSTOSCOPY WITH RETROGRADE PYELOGRAM/URETERAL STENT PLACEMENT;  Surgeon: Janith Lima, MD;  Location: [facility] OR[facility];  Service: Urology;  Laterality: Left;   CYSTOSCOPY/URETEROSCOPY/HOLMIUM LASER/STENT PLACEMENT Left 10/16/2020   Procedure: CYSTOSCOPY/URETEROSCOPY/HOLMIUM LASER/STENT PLACEMENT;  Surgeon: Billey Co, MD;  Location: [facility] OR[facility];  Service: Urology;  Laterality: Left;   SHOULDER ARTHROSCOPY WITH OPEN ROTATOR CUFF REPAIR Right 08/24/2017   Procedure: SHOULDER ARTHROSCOPY WITH OPEN ROTATOR CUFF REPAIR;  Surgeon: Corky Mull, MD;  Location: [facility] ORS;  Service: Orthopedics;  Laterality: Right;   SKIN CANCER EXCISION  12/01/2016   right ear    SKIN CANCER EXCISION     remove from the right side of the face    THROMBECTOMY Right 2004   leg   THYROIDECTOMY  1950   Not sure if total or partial thyroidectomy.     FAMILY HISTORY   Family History  Problem Relation Age of Onset   Hypertension Mother    Heart disease Mother    CAD Father    Heart attack Father      SOCIAL HISTORY   Social History   Tobacco Use   Smoking status: Former    Packs/day: 1.00    Years: 46.00    Pack years: 46.00    Types: Cigarettes    Start date: 01/10/1957    Quit date: 02/11/2003    Years since quitting: 17.7   Smokeless tobacco: Former    Types: Snuff    Quit date: 02/11/2003  Vaping Use   Vaping Use: Never used  Substance Use Topics   Alcohol use: Not Currently    Alcohol/week: 7.0 standard drinks    Types: 7 Cans of beer per week   Drug use: No     MEDICATIONS    Home Medication:    Current Medication:  Current Facility-Administered Medications:    0.9 %  sodium chloride infusion, 250 mL, Intravenous, Continuous, Ouma, Bing Neighbors, NP, Stopped at 10/28/20 9983   acetaminophen (TYLENOL) tablet 650 mg, 650 mg, Oral, Q6H PRN, Tu,  Ching T, DO   albuterol (PROVENTIL) (2.5 MG/3ML) 0.083% nebulizer solution 2.5 mg, 2.5 mg, Nebulization, Q4H PRN, Nolberto Hanlon, MD   apixaban (ELIQUIS) tablet 5 mg, 5 mg, Oral, BID, Tu, Ching T, DO, 5 mg at 10/28/20 0850   aspirin EC tablet 81 mg, 81 mg, Oral, QPC supper, Tu, Ching T, DO, 81 mg at 10/27/20 1722   atorvastatin (LIPITOR) tablet 40 mg, 40 mg, Oral, QPC supper, Tu, Ching T, DO, 40 mg at 10/27/20 1722   budesonide (PULMICORT) nebulizer solution 0.25 mg, 0.25 mg, Nebulization, BID, O[place], Bing Neighbors, NP, 0.25 mg at 10/28/20 0827   famotidine (PEPCID) tablet 20 mg, 20 mg, Oral, Daily, Tu, Ching T, DO, 20 mg at 10/28/20 0849   ferrous gluconate (FERGON) tablet  324 mg, 324 mg, Oral, QPC supper, Tu, Ching T, DO, 324 mg at 10/27/20 1722   furosemide (LASIX) injection 20 mg, 20 mg, Intravenous, Q12H, Lanney Gins, Vinicius Brockman, MD, 20 mg at 10/28/20 1403   guaiFENesin (MUCINEX) 12 hr tablet 600 mg, 600 mg, Oral, BID, Nolberto Hanlon, MD, 600 mg at 10/28/20 0850   guaiFENesin (ROBITUSSIN) 100 MG/5ML liquid 10 mL, 10 mL, Oral, Q4H PRN, Nolberto Hanlon, MD, 10 mL at 10/28/20 7829   ipratropium-albuterol (DUONEB) 0.5-2.5 (3) MG/3ML nebulizer solution 3 mL, 3 mL, Nebulization, BID, Kurtis Bushman, Gwynneth Albright, MD, 3 mL at 10/28/20 0827   linezolid (ZYVOX) IVPB 600 mg, 600 mg, Intravenous, Q12H, Nolberto Hanlon, MD, Last Rate: 300 mL/hr at 10/28/20 0942, Infusion Verify at 10/28/20 0942   loratadine (CLARITIN) tablet 10 mg, 10 mg, Oral, Daily, Tu, Ching T, DO, 10 mg at 10/28/20 0850   methylPREDNISolone sodium succinate (SOLU-MEDROL) 125 mg/2 mL injection 60 mg, 60 mg, Intravenous, Q24H, Leland Staszewski, MD, 60 mg at 10/28/20 1402   metoprolol succinate (TOPROL-XL) 24 hr tablet 12.5 mg, 12.5 mg, Oral, BID, Kurtis Bushman, Sahar, MD, 12.5 mg at 10/28/20 0849   montelukast (SINGULAIR) tablet 10 mg, 10 mg, Oral, QHS, Tu, Ching T, DO, 10 mg at 10/27/20 2117   pantoprazole (PROTONIX) EC tablet 40 mg, 40 mg, Oral, QPC supper, Tu, Ching T, DO,  40 mg at 10/27/20 1722   phenazopyridine (PYRIDIUM) tablet 200 mg, 200 mg, Oral, TID PRN, Tu, Ching T, DO   tamsulosin (FLOMAX) capsule 0.4 mg, 0.4 mg, Oral, Daily, Tu, Ching T, DO, 0.4 mg at 10/28/20 0850    ALLERGIES   Hydromorphone     REVIEW OF SYSTEMS    Review of Systems:  Gen:  Denies  fever, sweats, chills weigh loss  HEENT: Denies blurred vision, double vision, ear pain, eye pain, hearing loss, nose bleeds, sore throat Cardiac:  No dizziness, chest pain or heaviness, chest tightness,edema Resp:   Denies cough or sputum porduction, shortness of breath,wheezing, hemoptysis,  Gi: Denies swallowing difficulty, stomach pain, nausea or vomiting, diarrhea, constipation, bowel incontinence Gu:  Denies bladder incontinence, burning urine Ext:   Denies Joint pain, stiffness or swelling Skin: Denies  skin rash, easy bruising or bleeding or hives Endoc:  Denies polyuria, polydipsia , polyphagia or weight change Psych:   Denies depression, insomnia or hallucinations   Other:  All other systems negative   VS: BP (!) 146/78 (BP Location: Right Arm)   Pulse (!) 108   Temp 98.1 F (36.7 C) (Oral)   Resp 16   Ht 6' 2"  (1.88 m)   Wt 81.6 kg   SpO2 97%   BMI 23.11 kg/m      PHYSICAL EXAM    GENERAL:NAD, no fevers, chills, no weakness no fatigue HEAD: Normocephalic, atraumatic.  EYES: Pupils equal, round, reactive to light. Extraocular muscles intact. No scleral icterus.  MOUTH: Moist mucosal membrane. Dentition intact. No abscess noted.  EAR, NOSE, THROAT: Clear without exudates. No external lesions.  NECK: Supple. No thyromegaly. No nodules. No JVD.  PULMONARY: bilateral rhonchi CARDIOVASCULAR: S1 and S2. Irregular rate and rhythm. No murmurs, rubs, or gallops. No edema. Pedal pulses 2+ bilaterally.  GASTROINTESTINAL: Soft, nontender, nondistended. No masses. Positive bowel sounds. No hepatosplenomegaly.  MUSCULOSKELETAL: No swelling, clubbing, or edema. Range of motion  full in all extremities.  NEUROLOGIC: Cranial nerves II through XII are intact. No gross focal neurological deficits. Sensation intact. Reflexes intact.  SKIN: No ulceration, lesions, rashes, or cyanosis. Skin warm and dry.  Turgor intact.  PSYCHIATRIC: Mood, affect within normal limits. The patient is awake, alert and oriented x 3. Insight, judgment intact.       IMAGING        DG Chest 2 View  Result Date: 10/06/2020 CLINICAL DATA:  Shortness of breath. EXAM: CHEST - 2 VIEW COMPARISON:  September 27, 2020. FINDINGS: The heart size and mediastinal contours are within normal limits. Stable reticular densities are noted throughout both lungs consistent with scarring or fibrosis, but acute superimposed inflammation can not be excluded. The visualized skeletal structures are unremarkable. IMPRESSION: Stable reticular densities are noted throughout both lungs consistent with scarring or fibrosis, but acute superimposed inflammation can not be excluded. Electronically Signed   By: Marijo Conception M.D.   On: 10/06/2020 16:54   DG Abdomen 1 View  Result Date: 10/20/2020 CLINICAL DATA:  Sepsis.  Evaluate for stent placement. EXAM: ABDOMEN - 1 VIEW COMPARISON:  Fluoroscopy 10/16/2020 FINDINGS: A left ureteral stent is present. The proximal pigtail projects to the left of L3. This is lower than on the previous fluoroscopic image. The distal pigtail projects over the perineum below the level of the symphysis pubis. This suggests inferior migration of the stent with the distal pigtail probably either in the urethra or possibly in a prolapsed bladder. Scattered gas and stool throughout the colon and small bowel. No small or large bowel distention. No radiopaque stones are identified. Degenerative changes in the spine and hips. IMPRESSION: Left ureteral stent is present. The stent appears to have migrated inferiorly with the proximal pigtail lower than on prior study and the distal pigtail projecting below  the level of the symphysis pubis, possibly in the urethra or in a prolapsed bladder. Electronically Signed   By: Lucienne Capers M.D.   On: 10/20/2020 20:18   DG Chest Port 1 View  Result Date: 10/27/2020 CLINICAL DATA:  Chronic hypoxemia, COPD presents with weakness and shortness of breath EXAM: PORTABLE CHEST 1 VIEW COMPARISON:  Chest radiograph 10/23/2020 FINDINGS: The cardiomediastinal silhouette is stable. There are diffusely coarsened interstitial markings throughout both lungs, overall not significantly changed since 10/23/2020. There is no new or worsening focal airspace disease. There is no pleural effusion. There is no pneumothorax. There is no acute osseous abnormality. IMPRESSION: Unchanged coarsened interstitial markings throughout both lungs since 10/23/2020 though these are increased since 10/06/2020. Findings may reflect edema or infection superimposed on chronic changes. Electronically Signed   By: Valetta Mole M.D.   On: 10/27/2020 09:05   DG Chest Port 1 View  Result Date: 10/23/2020 CLINICAL DATA:  Respiratory failure, increasing shortness of breath EXAM: PORTABLE CHEST 1 VIEW COMPARISON:  10/20/2020 FINDINGS: Unchanged mildly enlarged cardiac contour. Redemonstrated diffuse airspace and interstitial opacities bilaterally, which appear largely unchanged compared to the prior exam. No definite pleural effusion. Aortic calcifications. No acute osseous abnormality. IMPRESSION: Unchanged bilateral pulmonary opacities, which could represent edema or infection. Electronically Signed   By: Merilyn Baba M.D.   On: 10/23/2020 17:49   DG Chest Port 1 View  Result Date: 10/20/2020 CLINICAL DATA:  Questionable sepsis.  Evaluate for abnormality. EXAM: PORTABLE CHEST 1 VIEW COMPARISON:  10/06/2020 FINDINGS: Mild cardiac enlargement. Diffuse airspace and interstitial infiltration throughout both lungs, progressing since prior study. This may represent edema or multifocal pneumonia. No pleural  effusions. No pneumothorax. Mediastinal contours appear intact. Calcification of the aorta. Degenerative changes in the spine and shoulders. IMPRESSION: Increasing bilateral pulmonary infiltrates, likely edema or pneumonia. Electronically Signed   By: Gwyndolyn Saxon  Gerilyn Nestle M.D.   On: 10/20/2020 19:08   DG OR UROLOGY CYSTO IMAGE ([facility] ONLY)  Result Date: 10/16/2020 There is no interpretation for this exam.  This order is for images obtained during a surgical procedure.  Please See "Surgeries" Tab for more information regarding the procedure.   ECHOCARDIOGRAM COMPLETE  Result Date: 10/23/2020    ECHOCARDIOGRAM REPORT   Patient Name:   [name] Browning Date of Exam: 10/23/2020 Medical Rec #:  [id number]     Height:       74.0 in Accession #:    [id number]    Weight:       180.0 lb Date of Birth:  1940-01-19     BSA:          2.078 m Patient Age:    53 years      BP:           133/80 mmHg Patient Gender: M             HR:           95 bpm. Exam Location:  [facility] Procedure: 2D Echo, Cardiac Doppler and Color Doppler Indications:     CHF-acute diastolic U88.91  History:         Patient has prior history of Echocardiogram examinations, most                  recent 04/20/2015. Angina, COPD and TIA; Signs/Symptoms:Syncope.  Sonographer:     Sherrie Sport Referring Phys:  [id number] [place] Diagnosing Phys: Ida Rogue MD  Sonographer Comments: Suboptimal apical window. IMPRESSIONS  1. Left ventricular ejection fraction, by estimation, is 60 to 65%. The left ventricle has normal function. The left ventricle has no regional wall motion abnormalities. Left ventricular diastolic parameters are indeterminate.  2. Right ventricular systolic function is normal. The right ventricular size is normal. There is mildly elevated pulmonary artery systolic pressure.  3. Rhythm is atrial fibrillation  4. Left atrial size was moderately dilated. FINDINGS  Left Ventricle: Left ventricular ejection fraction, by estimation, is 60 to 65%. The left  ventricle has normal function. The left ventricle has no regional wall motion abnormalities. The left ventricular internal cavity size was normal in size. There is  no left ventricular hypertrophy. Left ventricular diastolic parameters are indeterminate. Right Ventricle: The right ventricular size is normal. No increase in right ventricular wall thickness. Right ventricular systolic function is normal. There is mildly elevated pulmonary artery systolic pressure. The tricuspid regurgitant velocity is 2.95  m/s, and with an assumed right atrial pressure of 5 mmHg, the estimated right ventricular systolic pressure is 88.2 mmHg. Left Atrium: Left atrial size was moderately dilated. Right Atrium: Right atrial size was normal in size. Pericardium: There is no evidence of pericardial effusion. Mitral Valve: The mitral valve is normal in structure. There is mild thickening of the mitral valve leaflet(s). No evidence of mitral valve regurgitation. No evidence of mitral valve stenosis. Tricuspid Valve: The tricuspid valve is normal in structure. Tricuspid valve regurgitation is mild . No evidence of tricuspid stenosis. Aortic Valve: The aortic valve is normal in structure. Aortic valve regurgitation is not visualized. No aortic stenosis is present. Aortic valve mean gradient measures 2.0 mmHg. Aortic valve peak gradient measures 4.7 mmHg. Aortic valve area, by VTI measures 3.04 cm. Pulmonic Valve: The pulmonic valve was normal in structure. Pulmonic valve regurgitation is not visualized. No evidence of pulmonic stenosis. Aorta: The aortic root is normal in size and structure. Venous: The  inferior vena cava is normal in size with greater than 50% respiratory variability, suggesting right atrial pressure of 3 mmHg. IAS/Shunts: No atrial level shunt detected by color flow Doppler.  LEFT VENTRICLE PLAX 2D LVIDd:         4.19 cm LVIDs:         2.93 cm LV PW:         1.33 cm LV IVS:        0.95 cm LVOT diam:     2.00 cm LV SV:          55 LV SV Index:   26 LVOT Area:     3.14 cm  RIGHT VENTRICLE RV Basal diam:  4.55 cm RV S prime:     7.51 cm/s TAPSE (M-mode): 3.8 cm LEFT ATRIUM              Index        RIGHT ATRIUM           Index LA diam:        3.50 cm  1.68 cm/m   RA Area:     26.60 cm LA Vol (A2C):   93.4 ml  44.94 ml/m  RA Volume:   96.30 ml  46.34 ml/m LA Vol (A4C):   128.0 ml 61.59 ml/m LA Biplane Vol: 113.0 ml 54.37 ml/m  AORTIC VALVE                    PULMONIC VALVE AV Area (Vmax):    2.83 cm     PV Vmax:        0.96 m/s AV Area (Vmean):   2.84 cm     PV Peak grad:   3.6 mmHg AV Area (VTI):     3.04 cm     RVOT Peak grad: 4 mmHg AV Vmax:           108.00 cm/s AV Vmean:          70.100 cm/s AV VTI:            0.181 m AV Peak Grad:      4.7 mmHg AV Mean Grad:      2.0 mmHg LVOT Vmax:         97.30 cm/s LVOT Vmean:        63.300 cm/s LVOT VTI:          0.175 m LVOT/AV VTI ratio: 0.97  AORTA Ao Root diam: 3.30 cm MITRAL VALVE                TRICUSPID VALVE MV Area (PHT): 4.52 cm     TR Peak grad:   34.8 mmHg MV Decel Time: 168 msec     TR Vmax:        295.00 cm/s MV E velocity: 134.00 cm/s                             SHUNTS                             Systemic VTI:  0.18 m                             Systemic Diam: 2.00 cm Ida Rogue MD Electronically signed by Ida Rogue MD Signature Date/Time: 10/23/2020/5:30:24 PM    Final    CT Renal  Stone Study  Result Date: 10/20/2020 CLINICAL DATA:  Intermittent weakness, feeling as if he would faint. EXAM: CT ABDOMEN AND PELVIS WITHOUT CONTRAST TECHNIQUE: Multidetector CT imaging of the abdomen and pelvis was performed following the standard protocol without IV contrast. COMPARISON:  September 18, 2020 FINDINGS: Lower chest: Moderate severity chronic appearing fibrotic changes are seen involving the bilateral lung bases. Small bilateral pleural effusions are noted. Hepatobiliary: No focal liver abnormality is seen. No gallstones, gallbladder wall thickening, or biliary  dilatation. Pancreas: Unremarkable. No pancreatic ductal dilatation or surrounding inflammatory changes. Spleen: A punctate calcified granuloma is seen within an otherwise normal-appearing spleen. Adrenals/Urinary Tract: Adrenal glands are unremarkable. Kidneys are normal in size, without focal lesions. A 2 mm nonobstructing renal stone is seen within the upper pole of the right kidney. Clusters of subcentimeter nonobstructing renal stones are seen within the mid and lower left kidney. A left-sided endo ureteral stent is in place. The proximal portion of the stent is seen within the proximal left ureter, just beyond the left UPJ. The distal end extends into the urinary bladder, continues through the prostate gland in serpentine fashion to the proximal to mid portion of the urethra (axial CT image 97, CT series 2). The urinary bladder is empty and subsequently limited in evaluation. Stomach/Bowel: Stomach is within normal limits. Appendix appears normal. No evidence of bowel dilatation. Numerous diverticula are seen within the descending and sigmoid colon. Vascular/Lymphatic: Aortic atherosclerosis. No enlarged abdominal or pelvic lymph nodes. Reproductive: The prostate gland is moderately enlarged. A moderate amount of prostate gland calcification is seen. Other: A 4.4 cm x 2.7 cm fat containing right inguinal hernia is seen. No abdominopelvic ascites. Musculoskeletal: Multilevel degenerative changes seen throughout the lumbar spine. IMPRESSION: 1. Left-sided endo ureteral stent which may be malpositioned, as described above. Confirmation of the desired positioning is recommended. 2. Bilateral nonobstructing renal stones. 3. Small bilateral pleural effusions. 4. Colonic diverticulosis. Aortic Atherosclerosis (ICD10-I70.0). Electronically Signed   By: Virgina Norfolk M.D.   On: 10/20/2020 21:54      ASSESSMENT/PLAN   Acute on chronic hypoxemic respiratory failure - present on admission  - COVID19 negative   -MRSA nasal PCR -procalcitonin trend - supplemental O2 during my evaluation 3L/min - will perform infectious workup for pneumonia -Respiratory viral panel -serum fungitell -legionella ab -strep pneumoniae ur AG -Histoplasma Ur Ag -sputum resp cultures -AFB sputum expectorated specimen -sputum cytology  -reviewed pertinent imaging with patient today - ESR -PT/OT for d/c planning  -please encourage patient to use incentive spirometer few times each hour while hospitalized.   -agree with zyvox -adding lasix 20 bid IV -reducing solumedrol to 60 iv daily       Thank you for allowing me to participate in the care of this patient.  Total face to face encounter time for this patient visit was >63mn. >50% of the time was  spent in counseling and coordination of care.   Patient/Family are satisfied with care plan and all questions have been answered.  This document was prepared using Dragon voice recognition software and may include unintentional dictation errors.     FOttie Glazier M.D.  Division of [facility] [place]

## 2020-10-28 NOTE — Progress Notes (Addendum)
PROGRESS NOTE    [name] Browning  WCH:[id number] DOB: 1940-01-14 DOA: 10/20/2020 PCP: [place]    Brief Narrative:  This 80 y.o. male with medical history significant for COPD with chronic hypoxemia on 2 L, paroxysmal atrial fibrillation on Eliquis, hypertension, TIA and recent nephrolithiasis s/p ureteral stent who presented in the ED  with C/O: weakness and increasing shortness of breath. Patient recently hospitalized in September for abdominal pain and shortness of breath.  He was found to have COPD with pneumonia.  He also had obstructing left proximal ureteral stone with left-sided hydronephrosis, AKI and underwent left ureteral stent by urology on 9/11.  Subsequently following procedure he developed interstitial edema and hypotension requiring ICU/SDM BiPAP.  Patient was severely hypoxic requiring nonrebreather on arrival .  He was treated empirically for pneumonia and UTI.  CT renal study showed bilateral mild pleural effusion and possible malpositioning of ureteral stent.  Urology was consulted and the stent was removed.  He was initially placed on Levophed and then weaned off.  Now patient is being managed for COPD exacerbation, getting IV Solu-Medrol.  Assessment & Plan:   Principal Problem:   Acute on chronic respiratory failure with hypoxia (HCC) Active Problems:   History of TIA (transient ischemic attack)   Hyperlipidemia   AF (paroxysmal atrial fibrillation) (HCC)   Urinary tract obstruction by kidney stone   Community acquired pneumonia   Sepsis ([place])   Hypotension   Septic shock ([place])  Acute on chronic hypoxic respiratory failure secondary to CAP/acute diastolic CHF: Patient uses 2 L of supplemental oxygen at baseline. He presented with tachypnea with respiratory rate of 24, hypoxia with ambulation and exertion requiring up to 4 L. Echocardiogram LVEF normal. Patient was given Lasix 20 mg IV twice.  Appears euvolemic. Continue IV antibiotics. Now patient  continues to have significant wheezing on exam. Continue IV Solu-Medrol 40 mg IV twice daily Continue home inhalers, continue flutter valve and Spirometry. Pulmonology consulted recommended work-up for infectious pneumonia.    Septic shock sec. to community-acquired pneumonia and UTI  UCX + staph. Haemol and E. Enteroc.face Off Levophed on 10/12. Blood cultures negative so far. Urine culture with Enterococcus fae. Continue Zyvox will need to complete 6 more days.   Complicated UTI: Continue Zyvox for 6 more days since urine culture with resistant to vancomycin   Paroxysmal A. Fibrillation: Heart rate is now controlled. Continue Toprol 12.5 mg twice daily. Continue Eliquis   Thrombocytopenia probably due to sepsis.  Now improving.   History of TIA : Continue aspirin  Hyperlipidemia: Continue statin  Obstructing left ureteral stone with left hydronephrosis Patient had left ureteral stent placed on 9/11 by Dr. Abner Browning CT renal stone shows possible distal migration of the ureteral stent.   Continue tamsulosin. urologist input was appreciated.  His urethral stent was removed.   Will need to follow-up outpatient with urology in 6 months with KUB prior for stone monitoring    DVT prophylaxis: Eliquis Code Status: Full code Family Communication: No family at bedside.  Disposition Plan:   Status is: Inpatient  Remains inpatient appropriate because: :Inpatient level of care appropriate due to severity of illness   Dispo: The patient is from: Home              Anticipated d/c is to: Home              Patient currently is not medically stable to d/c.  Difficult to place patient No     Requires IV treatment  Consultants:  Pulmonology  Procedures: None  antimicrobials:   Anti-infectives (From admission, onward)    Start     Dose/Rate Route Frequency Ordered Stop   10/25/20 1300  linezolid (ZYVOX) tablet 600 mg  Status:  Discontinued        600 mg Oral Every  12 hours 10/25/20 1205 10/25/20 1208   10/25/20 1300  linezolid (ZYVOX) IVPB 600 mg        600 mg 300 mL/hr over 60 Minutes Intravenous Every 12 hours 10/25/20 1208     10/23/20 2100  vancomycin (VANCOREADY) IVPB 1750 mg/350 mL  Status:  Discontinued        1,750 mg 175 mL/hr over 120 Minutes Intravenous Every 24 hours 10/23/20 0736 10/25/20 1202   10/23/20 1800  azithromycin (ZITHROMAX) 500 mg in sodium chloride 0.9 % 250 mL IVPB        500 mg 250 mL/hr over 60 Minutes Intravenous Every 24 hours 10/23/20 0739 10/24/20 1812   10/23/20 0800  vancomycin (VANCOCIN) IVPB 1000 mg/200 mL premix  Status:  Discontinued        1,000 mg 200 mL/hr over 60 Minutes Intravenous Every 12 hours 10/22/20 1644 10/23/20 0736   10/22/20 1700  vancomycin (VANCOREADY) IVPB 1750 mg/350 mL        1,750 mg 175 mL/hr over 120 Minutes Intravenous  Once 10/22/20 1631 10/22/20 2158   10/21/20 1800  azithromycin (ZITHROMAX) 500 mg in sodium chloride 0.9 % 250 mL IVPB  Status:  Discontinued        500 mg 250 mL/hr over 60 Minutes Intravenous Every 24 hours 10/20/20 2225 10/23/20 0739   10/21/20 0200  ceFEPIme (MAXIPIME) 2 g in sodium chloride 0.9 % 100 mL IVPB        2 g 200 mL/hr over 30 Minutes Intravenous Every 8 hours 10/20/20 2245 10/25/20 2159   10/20/20 1815  ceFEPIme (MAXIPIME) 2 g in sodium chloride 0.9 % 100 mL IVPB        2 g 200 mL/hr over 30 Minutes Intravenous  Once 10/20/20 1809 10/20/20 1938   10/20/20 1815  azithromycin (ZITHROMAX) 500 mg in sodium chloride 0.9 % 250 mL IVPB        500 mg 250 mL/hr over 60 Minutes Intravenous  Once 10/20/20 1809 10/20/20 1938       Subjective: Patient was seen and examined at bedside.  Overnight events noted.   Patient reports feeling better,  he still has significant amount of wheezing on exam.  Objective: Vitals:   10/28/20 0013 10/28/20 0513 10/28/20 0741 10/28/20 0828  BP: 117/80 123/77 (!) 146/78   Pulse: 74 91 (!) 108   Resp: 14 16 16    Temp: 97.7 F  (36.5 C) 97.7 F (36.5 C) 98.1 F (36.7 C)   TempSrc: Oral  Oral   SpO2: 98% 96% 98% 97%  Weight:      Height:        Intake/Output Summary (Last 24 hours) at 10/28/2020 1412 Last data filed at 10/28/2020 0942 Gross per 24 hour  Intake 2242.73 ml  Output 2772 ml  Net -529.27 ml   Filed Weights   10/20/20 1722  Weight: 81.6 kg    Examination:  General exam: Appears comfortable, not in any acute distress, chronically ill looking. Respiratory system: Wheezing bilaterally, no crackles, respiratory effort normal. Cardiovascular system: S1-S2 heard, Irregular rate and rhythm, no murmur.   Gastrointestinal system:  Abdomen is soft, nontender, distended, BS+ Central nervous system: Alert and oriented. No focal neurological deficits. Extremities: No edema, no cyanosis, no clubbing. Skin: No rashes, lesions or ulcers Psychiatry: Judgement and insight appear normal. Mood & affect appropriate.     Data Reviewed: I have personally reviewed following labs and imaging studies  CBC: Recent Labs  Lab 10/22/20 0624 10/23/20 0926 10/26/20 0831  WBC 8.0 9.6 11.1*  HGB 9.8* 10.8* 10.3*  HCT 30.5* 33.7* 31.6*  MCV 93.8 96.8 94.6  PLT 145* 155 676   Basic Metabolic Panel: Recent Labs  Lab 10/22/20 0624 10/23/20 0450 10/25/20 0543 10/26/20 0436 10/27/20 0044 10/27/20 2355  NA 137 136 137 135  --  136  K 4.4 4.0 4.1 3.8  --  4.2  CL 108 107 106 103  --  101  CO2 24 25 27 27   --  26  GLUCOSE 146* 108* 176* 139*  --  201*  BUN 18 31* 40* 43*  --  42*  CREATININE 0.75 1.08 0.93 1.24 1.05 1.17  CALCIUM 8.4* 8.2* 8.2* 8.5*  --  8.7*  MG  --   --   --   --  2.1 2.0   GFR: Estimated Creatinine Clearance: 59.1 mL/min (by C-G formula based on SCr of 1.17 mg/dL). Liver Function Tests: No results for input(s): AST, ALT, ALKPHOS, BILITOT, PROT, ALBUMIN in the last 168 hours. No results for input(s): LIPASE, AMYLASE in the last 168 hours. No results for input(s): AMMONIA in the last  168 hours. Coagulation Profile: No results for input(s): INR, PROTIME in the last 168 hours. Cardiac Enzymes: No results for input(s): CKTOTAL, CKMB, CKMBINDEX, TROPONINI in the last 168 hours. BNP (last 3 results) No results for input(s): PROBNP in the last 8760 hours. HbA1C: No results for input(s): HGBA1C in the last 72 hours. CBG: No results for input(s): GLUCAP in the last 168 hours. Lipid Profile: No results for input(s): CHOL, HDL, LDLCALC, TRIG, CHOLHDL, LDLDIRECT in the last 72 hours. Thyroid Function Tests: No results for input(s): TSH, T4TOTAL, FREET4, T3FREE, THYROIDAB in the last 72 hours. Anemia Panel: No results for input(s): VITAMINB12, FOLATE, FERRITIN, TIBC, IRON, RETICCTPCT in the last 72 hours. Sepsis Labs: Recent Labs  Lab 10/22/20 0624 10/23/20 0450 10/28/20 1102  PROCALCITON <0.10 <0.10 <0.10    Recent Results (from the past 240 hour(s))  Resp Panel by RT-PCR (Flu A&B, Covid) Nasopharyngeal Swab     Status: None   Collection Time: 10/20/20  5:33 PM   Specimen: Nasopharyngeal Swab; Nasopharyngeal(NP) swabs in vial transport medium  Result Value Ref Range Status   SARS Coronavirus 2 by RT PCR NEGATIVE NEGATIVE Final    Comment: (NOTE) SARS-CoV-2 target nucleic acids are NOT DETECTED.  The SARS-CoV-2 RNA is generally detectable in upper respiratory specimens during the acute phase of infection. The lowest concentration of SARS-CoV-2 viral copies this assay can detect is 138 copies/mL. A negative result does not preclude SARS-Cov-2 infection and should not be used as the sole basis for treatment or other patient management decisions. A negative result may occur with  improper specimen collection/handling, submission of specimen other than nasopharyngeal swab, presence of viral mutation(s) within the areas targeted by this assay, and inadequate number of viral copies(<138 copies/mL). A negative result must be combined with clinical observations, patient  history, and epidemiological information. The expected result is Negative.  Fact Sheet for Patients:  EntrepreneurPulse.com.au  Fact Sheet for Healthcare Providers:  IncredibleEmployment.be  This test is no t  yet approved or cleared by the [place] and  has been authorized for detection and/or diagnosis of SARS-CoV-2 by FDA under an Emergency Use Authorization (EUA). This EUA will remain  in effect (meaning this test can be used) for the duration of the COVID-19 declaration under Section 564(b)(1) of the Act, 21 U.S.C.section 360bbb-3(b)(1), unless the authorization is terminated  or revoked sooner.       Influenza A by PCR NEGATIVE NEGATIVE Final   Influenza B by PCR NEGATIVE NEGATIVE Final    Comment: (NOTE) The Xpert Xpress SARS-CoV-2/FLU/RSV plus assay is intended as an aid in the diagnosis of influenza from Nasopharyngeal swab specimens and should not be used as a sole basis for treatment. Nasal washings and aspirates are unacceptable for Xpert Xpress SARS-CoV-2/FLU/RSV testing.  Fact Sheet for Patients: EntrepreneurPulse.com.au  Fact Sheet for Healthcare Providers: IncredibleEmployment.be  This test is not yet approved or cleared by the [place] FDA and has been authorized for detection and/or diagnosis of SARS-CoV-2 by FDA under an Emergency Use Authorization (EUA). This EUA will remain in effect (meaning this test can be used) for the duration of the COVID-19 declaration under Section 564(b)(1) of the Act, 21 U.S.C. section 360bbb-3(b)(1), unless the authorization is terminated or revoked.  Performed at [facility], [address] [place], [place] [address]   Blood Culture (routine x 2)     Status: None   Collection Time: 10/20/20  5:58 PM   Specimen: BLOOD  Result Value Ref Range Status   Specimen Description BLOOD BLOOD LEFT FOREARM  Final   Special Requests    Final    BOTTLES DRAWN AEROBIC AND ANAEROBIC Blood Culture results may not be optimal due to an excessive volume of blood received in culture bottles   Culture   Final    NO GROWTH 5 DAYS Performed at [facility], [address] [place], [place] [address]    Report Status 10/25/2020 FINAL  Final  Blood Culture (routine x 2)     Status: None   Collection Time: 10/20/20  5:58 PM   Specimen: BLOOD  Result Value Ref Range Status   Specimen Description BLOOD RIGHT ANTECUBITAL  Final   Special Requests   Final    BOTTLES DRAWN AEROBIC AND ANAEROBIC Blood Culture results may not be optimal due to an excessive volume of blood received in culture bottles   Culture   Final    NO GROWTH 5 DAYS Performed at [facility], [address] [place], [place] [address]    Report Status 10/25/2020 FINAL  Final  Urine Culture     Status: Abnormal   Collection Time: 10/20/20  8:20 PM   Specimen: Urine, Clean Catch  Result Value Ref Range Status   Specimen Description   Final    URINE, CLEAN CATCH Performed at [facility], [address], [place], [place] [address]    Special Requests   Final    NONE Performed at [facility], [address] [place], [place] [address]    Culture (A)  Final    >=100,000 COLONIES/mL STAPHYLOCOCCUS HAEMOLYTICUS >=100,000 COLONIES/mL VANCOMYCIN RESISTANT ENTEROCOCCUS    Report Status 10/24/2020 FINAL  Final   Organism ID, Bacteria STAPHYLOCOCCUS HAEMOLYTICUS (A)  Final   Organism ID, Bacteria VANCOMYCIN RESISTANT ENTEROCOCCUS (A)  Final      Susceptibility   Staphylococcus haemolyticus - MIC*    CIPROFLOXACIN >=8 RESISTANT Resistant     GENTAMICIN >=16 RESISTANT Resistant  NITROFURANTOIN <=16 SENSITIVE Sensitive     OXACILLIN >=4 RESISTANT Resistant     TETRACYCLINE 2 SENSITIVE Sensitive     VANCOMYCIN 1 SENSITIVE Sensitive     TRIMETH/SULFA >=320 RESISTANT Resistant     CLINDAMYCIN >=8 RESISTANT Resistant      RIFAMPIN >=32 RESISTANT Resistant     Inducible Clindamycin NEGATIVE Sensitive     * >=100,000 COLONIES/mL STAPHYLOCOCCUS HAEMOLYTICUS   Vancomycin resistant enterococcus - MIC*    AMPICILLIN >=32 RESISTANT Resistant     NITROFURANTOIN 64 INTERMEDIATE Intermediate     VANCOMYCIN >=32 RESISTANT Resistant     LINEZOLID 2 SENSITIVE Sensitive     * >=100,000 COLONIES/mL VANCOMYCIN RESISTANT ENTEROCOCCUS  Expectorated Sputum Assessment w Gram Stain, Rflx to Resp Cult     Status: None   Collection Time: 10/22/20  9:10 AM   Specimen: Expectorated Sputum  Result Value Ref Range Status   Specimen Description EXPECTORATED SPUTUM  Final   Special Requests NONE  Final   Sputum evaluation   Final    THIS SPECIMEN IS ACCEPTABLE FOR SPUTUM CULTURE Performed at [facility], [address] [place], [place] [address]    Report Status 10/22/2020 FINAL  Final  Culture, Respiratory w Gram Stain     Status: None   Collection Time: 10/22/20  9:10 AM  Result Value Ref Range Status   Specimen Description   Final    EXPECTORATED SPUTUM Performed at [facility], [address] [place], [place] [address]    Special Requests   Final    NONE Reflexed from [id number] Performed at [facility], [address] [place], [place] [address]    Gram Stain   Final    MODERATE WBC PRESENT,BOTH PMN AND MONONUCLEAR RARE GRAM NEGATIVE RODS RARE GRAM POSITIVE COCCI    Culture   Final    FEW Normal respiratory flora-no Staph aureus or Pseudomonas seen Performed at [facility], [place] [address] [place], [place] [address]    Report Status 10/24/2020 FINAL  Final  MRSA Next Gen by PCR, Nasal     Status: None   Collection Time: 10/28/20 12:30 PM   Specimen: Nasal Mucosa; Nasal Swab  Result Value Ref Range Status   MRSA by PCR Next Gen NOT DETECTED NOT DETECTED Final    Comment: (NOTE) The GeneXpert MRSA Assay (FDA approved for NASAL specimens only), is one component of a  comprehensive MRSA colonization surveillance program. It is not intended to diagnose MRSA infection nor to guide or monitor treatment for MRSA infections. Test performance is not FDA approved in patients less than 48 years old. Performed at [facility], [address] [place], [place] [address]     Radiology Studies: DG Chest [place] 1 View  Result Date: 10/27/2020 CLINICAL DATA:  Chronic hypoxemia, COPD presents with weakness and shortness of breath EXAM: PORTABLE CHEST 1 VIEW COMPARISON:  Chest radiograph 10/23/2020 FINDINGS: The cardiomediastinal silhouette is stable. There are diffusely coarsened interstitial markings throughout both lungs, overall not significantly changed since 10/23/2020. There is no new or worsening focal airspace disease. There is no pleural effusion. There is no pneumothorax. There is no acute osseous abnormality. IMPRESSION: Unchanged coarsened interstitial markings throughout both lungs since 10/23/2020 though these are increased since 10/06/2020. Findings may reflect edema or infection superimposed on chronic changes. Electronically Signed   By: Valetta Mole M.D.   On: 10/27/2020 09:05    Scheduled Meds:  apixaban  5 mg Oral BID  aspirin EC  81 mg Oral QPC supper   atorvastatin  40 mg Oral QPC supper   budesonide (PULMICORT) nebulizer solution  0.25 mg Nebulization BID   famotidine  20 mg Oral Daily   ferrous gluconate  324 mg Oral QPC supper   furosemide  20 mg Intravenous Q12H   guaiFENesin  600 mg Oral BID   ipratropium-albuterol  3 mL Nebulization BID   loratadine  10 mg Oral Daily   methylPREDNISolone (SOLU-MEDROL) injection  60 mg Intravenous Q24H   metoprolol succinate  12.5 mg Oral BID   montelukast  10 mg Oral QHS   pantoprazole  40 mg Oral QPC supper   tamsulosin  0.4 mg Oral Daily   Continuous Infusions:  sodium chloride Stopped (10/28/20 0854)   linezolid (ZYVOX) IV 300 mL/hr at 10/28/20 0942     LOS: 8 days    Time spent: 35  mins    [name] Schlageter, MD [facility]   If 7PM-7AM, please contact night-coverage

## 2020-10-28 NOTE — Progress Notes (Signed)
Occupational Therapy Treatment Patient Details Name: [name] Browning MRN: [id number] DOB: 1940/01/25 Today's Date: 10/28/2020   History of present illness 80 y.o. male presenting to ED with concerns of weakness and increasing shortness of breath. Medical history significant for COPD with chronic hypoxemia on 2 L, paroxysmal atrial fibrillation on Eliquis, hypertension, TIA and recent nephrolithiasis s/p ureteral stent.   OT comments  [name] Browning was seen for OT treatment on this date. Upon arrival to room pt reclined in bed, agreeable to session. Pt requires close SUPERVISION for ~80 ft in room mobility including navigating obstacles and transporting medium sized items (shoes). MIN cues to ID falls hazard and correct O2 line placement. X1 minor and x1 moderate LOBs noted, able to self correct. Pt making good progress toward goals. Pt continues to benefit from skilled OT services to maximize return to PLOF and minimize risk of future falls, injury, caregiver burden, and readmission. Will continue to follow POC. Discharge recommendation remains appropriate.     Recommendations for follow up therapy are one component of a multi-disciplinary discharge planning process, led by the attending physician.  Recommendations may be updated based on patient status, additional functional criteria and insurance authorization.    Follow Up Recommendations  Home health OT;Supervision/Assistance - 24 hour    Equipment Recommendations  3 in 1 bedside commode;Tub/shower seat    Recommendations for Other Services      Precautions / Restrictions Precautions Precautions: Fall Restrictions Weight Bearing Restrictions: No       Mobility Bed Mobility Overal bed mobility: Modified Independent             General bed mobility comments: Increased time, use of bed rails    Transfers Overall transfer level: Needs assistance Equipment used: Rolling walker (2 wheeled) Transfers: Sit to/from Stand Sit to  Stand: Supervision              Balance Overall balance assessment: Needs assistance Sitting-balance support: No upper extremity supported;Feet supported Sitting balance-Leahy Scale: Good     Standing balance support: No upper extremity supported;During functional activity Standing balance-Leahy Scale: Fair Standing balance comment: x2 minor LOB, able to self-correct                           ADL either performed or assessed with clinical judgement   ADL Overall ADL's : Needs assistance/impaired                                       General ADL Comments: Close SUPERVISION for ADL t/f including navigating obstacles and transporting medium sized items (shoes). MIN cues to ID falls hazard with O2 line and correct.      Cognition Arousal/Alertness: Awake/alert Behavior During Therapy: WFL for tasks assessed/performed;Flat affect Overall Cognitive Status: Within Functional Limits for tasks assessed                                 General Comments: poor safety awareness - cues for lines/leads mgmt        Exercises Exercises: Other exercises Other Exercises Other Exercises: Pt educated re: DME recs, d/c recs, falls prevention, ECS Other Exercises: ~80 ft mobility, sit<>stand, functinal reach,      General Comments SpO2 90% on 3 L [name] t/o    Pertinent Vitals/ Pain  Pain Assessment: No/denies pain   Frequency  Min 1X/week        Progress Toward Goals  OT Goals(current goals can now be found in the care plan section)  Progress towards OT goals: Progressing toward goals  Acute Rehab OT Goals Patient Stated Goal: to walk longer distances OT Goal Formulation: With patient Time For Goal Achievement: 11/06/20 Potential to Achieve Goals: Good  Plan Discharge plan remains appropriate;Frequency remains appropriate    Co-evaluation                 AM-PAC OT "6 Clicks" Daily Activity     Outcome Measure   Help  from another person eating meals?: None Help from another person taking care of personal grooming?: A Little Help from another person toileting, which includes using toliet, bedpan, or urinal?: A Little Help from another person bathing (including washing, rinsing, drying)?: A Little Help from another person to put on and taking off regular upper body clothing?: None Help from another person to put on and taking off regular lower body clothing?: A Little 6 Click Score: 20    End of Session Equipment Utilized During Treatment: Rolling walker;Oxygen  OT Visit Diagnosis: Muscle weakness (generalized) (M62.81)   Activity Tolerance Patient tolerated treatment well   Patient Left in bed;with call bell/phone within reach;with bed alarm set   Nurse Communication          Time: 0076-2263 OT Time Calculation (min): 15 min  Charges: OT General Charges $OT Visit: 1 Visit OT Treatments $Self Care/Home Management : 8-22 mins  Dessie Coma, M.S. OTR/L  10/28/20, 3:54 PM  ascom [phone]

## 2020-10-28 NOTE — Progress Notes (Signed)
Mobility Specialist - Progress Note   10/28/20 1300  Mobility  Activity Transferred:  Chair to bed  Level of Assistance Standby assist, set-up cues, supervision of patient - no hands on  Assistive Device Front wheel walker  Distance Ambulated (ft) 4 ft  Mobility Sit up in bed/chair position for meals;Ambulated with assistance in room  Mobility Response Tolerated well  Mobility performed by Mobility specialist  $Mobility charge 1 Mobility    Returned chair-bed.    Kathee Delton Mobility Specialist 10/28/20, 2:22 PM

## 2020-10-28 NOTE — Progress Notes (Signed)
MD Dwyane Dee received secure chat from this RN to call granddaughter with update on patient. MD confirmed he will call with an update.

## 2020-10-29 ENCOUNTER — Inpatient Hospital Stay: Payer: Medicare HMO

## 2020-10-29 DIAGNOSIS — J9621 Acute and chronic respiratory failure with hypoxia: Secondary | ICD-10-CM | POA: Diagnosis not present

## 2020-10-29 LAB — CBC
HCT: 35.3 % — ABNORMAL LOW (ref 39.0–52.0)
Hemoglobin: 11.5 g/dL — ABNORMAL LOW (ref 13.0–17.0)
MCH: 31.1 pg (ref 26.0–34.0)
MCHC: 32.6 g/dL (ref 30.0–36.0)
MCV: 95.4 fL (ref 80.0–100.0)
Platelets: 266 10*3/uL (ref 150–400)
RBC: 3.7 MIL/uL — ABNORMAL LOW (ref 4.22–5.81)
RDW: 14.5 % (ref 11.5–15.5)
WBC: 16.5 10*3/uL — ABNORMAL HIGH (ref 4.0–10.5)
nRBC: 0.1 % (ref 0.0–0.2)

## 2020-10-29 LAB — MAGNESIUM: Magnesium: 2 mg/dL (ref 1.7–2.4)

## 2020-10-29 LAB — BASIC METABOLIC PANEL
Anion gap: 7 (ref 5–15)
BUN: 45 mg/dL — ABNORMAL HIGH (ref 8–23)
CO2: 30 mmol/L (ref 22–32)
Calcium: 8.7 mg/dL — ABNORMAL LOW (ref 8.9–10.3)
Chloride: 98 mmol/L (ref 98–111)
Creatinine, Ser: 1.11 mg/dL (ref 0.61–1.24)
GFR, Estimated: >60 mL/min (ref >60)
Glucose, Bld: 141 mg/dL — ABNORMAL HIGH (ref 70–99)
Potassium: 3.7 mmol/L (ref 3.5–5.1)
Sodium: 135 mmol/L (ref 135–145)

## 2020-10-29 LAB — LEGIONELLA PNEUMOPHILA TOTAL AB: Legionella Pneumo Total Ab: <0.91 OD ratio (ref 0.00–0.90)

## 2020-10-29 LAB — PHOSPHORUS: Phosphorus: 3.4 mg/dL (ref 2.5–4.6)

## 2020-10-29 MED ORDER — METHYLPREDNISOLONE SODIUM SUCC 40 MG IJ SOLR
40.0000 mg | INTRAMUSCULAR | Status: DC
  Administered 2020-10-29: 40 mg via INTRAVENOUS
  Filled 2020-10-29: qty 1

## 2020-10-29 MED ORDER — LOSARTAN POTASSIUM 50 MG PO TABS: 50.0000 mg | ORAL_TABLET | Freq: Every day | ORAL | Status: DC

## 2020-10-29 NOTE — Progress Notes (Signed)
Mobility Specialist - Progress Note   10/29/20 1700  Mobility  Activity Refused mobility  Mobility performed by Mobility specialist    2nd attempt this date. Pt politely declined mobility, no reason specified. Will attempt session another date.    Kathee Delton Mobility Specialist 10/29/20, 5:03 PM

## 2020-10-29 NOTE — Progress Notes (Signed)
PROGRESS NOTE    [name] Browning  HCW:[id number] DOB: [birth year] DOA: 10/20/2020 PCP: [facility]    Brief Narrative:  This 80 y.o. male with medical history significant for COPD with chronic hypoxemia on 2 L, paroxysmal atrial fibrillation on Eliquis, hypertension, TIA and recent nephrolithiasis s/p ureteral stent who presented in the ED  with C/O: weakness and increasing shortness of breath. Patient recently hospitalized in September for abdominal pain and shortness of breath.  He was found to have COPD with pneumonia.  He also had obstructing left proximal ureteral stone with left-sided hydronephrosis, AKI and underwent left ureteral stent by urology on 9/11.  Subsequently following procedure he developed interstitial edema and hypotension requiring ICU/SDM BiPAP.  Patient was severely hypoxic requiring nonrebreather on arrival .  He was treated empirically for pneumonia and UTI.  CT renal study showed bilateral mild pleural effusion and possible malpositioning of ureteral stent.  Urology was consulted and the stent was removed.  He was initially placed on Levophed and then weaned off.  Now patient is being managed for COPD exacerbation /  UTI, getting IV Solu-Medrol.  Assessment & Plan:   Principal Problem:   Acute on chronic respiratory failure with hypoxia (HCC) Active Problems:   History of TIA (transient ischemic attack)   Hyperlipidemia   AF (paroxysmal atrial fibrillation) (HCC)   Urinary tract obstruction by kidney stone   Community acquired pneumonia   Sepsis ([place])   Hypotension   Septic shock ([place])  Acute on chronic hypoxic respiratory failure secondary to CAP/acute diastolic CHF: Patient uses 2 L of supplemental oxygen at baseline. He presented with tachypnea with respiratory rate of 24, hypoxia with ambulation and exertion requiring up to 4 L. Echocardiogram showed LVEF normal. Patient was given Lasix 20 mg IV twice.  Appears euvolemic. Continue IV  antibiotics. Now patient continues to have significant wheezing on exam. Continue IV Solu-Medrol 40 mg IV twice daily. Continue home inhalers, continue flutter valve and Spirometry. Pulmonology consulted recommended work-up for infectious pneumonia.    Septic shock sec. to community-acquired pneumonia and UTI  UCX + staph. Haemol and E. Enteroc.face Off Levophed on 10/12. Blood cultures negative so far. Urine culture with Enterococcus fae. Continue Zyvox,  will need to complete 6 more days. Stop date  17/61/60   Complicated UTI: Continue Zyvox for 5 more days since urine culture with resistant to vancomycin   Paroxysmal A. Fibrillation: Heart rate is now controlled. Continue Toprol 12.5 mg twice daily. Continue Eliquis   Thrombocytopenia probably due to sepsis.  Now improving.   History of TIA : Continue aspirin  Hyperlipidemia: Continue statin  Obstructing left ureteral stone with left hydronephrosis Patient had left ureteral stent placed on 9/11 by Dr. Abner Greenspan CT renal stone shows possible distal migration of the ureteral stent.   Continue tamsulosin. urologist input was appreciated.  His urethral stent was removed.   Will need to follow-up outpatient with urology in 6 months with KUB prior for stone monitoring    DVT prophylaxis: Eliquis Code Status: Full code Family Communication: No family at bedside.  Disposition Plan:   Status is: Inpatient  Remains inpatient appropriate because: :Inpatient level of care appropriate due to severity of illness   Dispo: The patient is from: Home              Anticipated d/c is to: Home              Patient currently is not medically stable to d/c.  Difficult to place patient No     Requires IV treatment  Consultants:  Pulmonology  Procedures: None  antimicrobials:   Anti-infectives (From admission, onward)    Start     Dose/Rate Route Frequency Ordered Stop   10/25/20 1300  linezolid (ZYVOX) tablet 600 mg   Status:  Discontinued        600 mg Oral Every 12 hours 10/25/20 1205 10/25/20 1208   10/25/20 1300  linezolid (ZYVOX) IVPB 600 mg        600 mg 300 mL/hr over 60 Minutes Intravenous Every 12 hours 10/25/20 1208 11/03/20 1146   10/23/20 2100  vancomycin (VANCOREADY) IVPB 1750 mg/350 mL  Status:  Discontinued        1,750 mg 175 mL/hr over 120 Minutes Intravenous Every 24 hours 10/23/20 0736 10/25/20 1202   10/23/20 1800  azithromycin (ZITHROMAX) 500 mg in sodium chloride 0.9 % 250 mL IVPB        500 mg 250 mL/hr over 60 Minutes Intravenous Every 24 hours 10/23/20 0739 10/24/20 1812   10/23/20 0800  vancomycin (VANCOCIN) IVPB 1000 mg/200 mL premix  Status:  Discontinued        1,000 mg 200 mL/hr over 60 Minutes Intravenous Every 12 hours 10/22/20 1644 10/23/20 0736   10/22/20 1700  vancomycin (VANCOREADY) IVPB 1750 mg/350 mL        1,750 mg 175 mL/hr over 120 Minutes Intravenous  Once 10/22/20 1631 10/22/20 2158   10/21/20 1800  azithromycin (ZITHROMAX) 500 mg in sodium chloride 0.9 % 250 mL IVPB  Status:  Discontinued        500 mg 250 mL/hr over 60 Minutes Intravenous Every 24 hours 10/20/20 2225 10/23/20 0739   10/21/20 0200  ceFEPIme (MAXIPIME) 2 g in sodium chloride 0.9 % 100 mL IVPB        2 g 200 mL/hr over 30 Minutes Intravenous Every 8 hours 10/20/20 2245 10/25/20 2159   10/20/20 1815  ceFEPIme (MAXIPIME) 2 g in sodium chloride 0.9 % 100 mL IVPB        2 g 200 mL/hr over 30 Minutes Intravenous  Once 10/20/20 1809 10/20/20 1938   10/20/20 1815  azithromycin (ZITHROMAX) 500 mg in sodium chloride 0.9 % 250 mL IVPB        500 mg 250 mL/hr over 60 Minutes Intravenous  Once 10/20/20 1809 10/20/20 1938       Subjective: Patient was seen and examined at bedside.  Overnight events noted.   Patient reports feeling much better, he still has significant amount of wheezing on exam.   He is on 4 L of supplemental oxygen at present. Objective: Vitals:   10/28/20 2321 10/29/20 0419  10/29/20 0810 10/29/20 1108  BP: 119/68 139/65 133/64 140/67  Pulse: 78 93 63 78  Resp: 18 18 18    Temp: (!) 97.5 F (36.4 C) 97.6 F (36.4 C) 97.6 F (36.4 C) (!) 97.5 F (36.4 C)  TempSrc:  Oral Oral Oral  SpO2: 99% 99% 100% 99%  Weight:      Height:        Intake/Output Summary (Last 24 hours) at 10/29/2020 1315 Last data filed at 10/29/2020 0814 Gross per 24 hour  Intake 328.19 ml  Output 3900 ml  Net -3571.81 ml   Filed Weights   10/20/20 1722  Weight: 81.6 kg    Examination:  General exam: Appears comfortable, not in any acute distress, chronically ill looking. Respiratory system: Mild Wheezing bilaterally, No crackles, respiratory effort  normal. Cardiovascular system: S1-S2 heard, Irregular rate and rhythm, no murmur.   Gastrointestinal system: Abdomen is soft, nontender, distended, BS+ Central nervous system: Alert and oriented. No focal neurological deficits. Extremities: No edema, no cyanosis, no clubbing. Skin: No rashes, lesions or ulcers Psychiatry: Judgement and insight appear normal. Mood & affect appropriate.     Data Reviewed: I have personally reviewed following labs and imaging studies  CBC: Recent Labs  Lab 10/23/20 0926 10/26/20 0831 10/29/20 0451  WBC 9.6 11.1* 16.5*  HGB 10.8* 10.3* 11.5*  HCT 33.7* 31.6* 35.3*  MCV 96.8 94.6 95.4  PLT 155 186 716   Basic Metabolic Panel: Recent Labs  Lab 10/23/20 0450 10/25/20 0543 10/26/20 0436 10/27/20 0044 10/27/20 2355 10/29/20 0451  NA 136 137 135  --  136 135  K 4.0 4.1 3.8  --  4.2 3.7  CL 107 106 103  --  101 98  CO2 25 27 27   --  26 30  GLUCOSE 108* 176* 139*  --  201* 141*  BUN 31* 40* 43*  --  42* 45*  CREATININE 1.08 0.93 1.24 1.05 1.17 1.11  CALCIUM 8.2* 8.2* 8.5*  --  8.7* 8.7*  MG  --   --   --  2.1 2.0 2.0  PHOS  --   --   --   --   --  3.4   GFR: Estimated Creatinine Clearance: 62.3 mL/min (by C-G formula based on SCr of 1.11 mg/dL). Liver Function Tests: No results  for input(s): AST, ALT, ALKPHOS, BILITOT, PROT, ALBUMIN in the last 168 hours. No results for input(s): LIPASE, AMYLASE in the last 168 hours. No results for input(s): AMMONIA in the last 168 hours. Coagulation Profile: No results for input(s): INR, PROTIME in the last 168 hours. Cardiac Enzymes: No results for input(s): CKTOTAL, CKMB, CKMBINDEX, TROPONINI in the last 168 hours. BNP (last 3 results) No results for input(s): PROBNP in the last 8760 hours. HbA1C: No results for input(s): HGBA1C in the last 72 hours. CBG: No results for input(s): GLUCAP in the last 168 hours. Lipid Profile: No results for input(s): CHOL, HDL, LDLCALC, TRIG, CHOLHDL, LDLDIRECT in the last 72 hours. Thyroid Function Tests: No results for input(s): TSH, T4TOTAL, FREET4, T3FREE, THYROIDAB in the last 72 hours. Anemia Panel: No results for input(s): VITAMINB12, FOLATE, FERRITIN, TIBC, IRON, RETICCTPCT in the last 72 hours. Sepsis Labs: Recent Labs  Lab 10/23/20 0450 10/28/20 1102  PROCALCITON <0.10 <0.10    Recent Results (from the past 240 hour(s))  Resp Panel by RT-PCR (Flu A&B, Covid) Nasopharyngeal Swab     Status: None   Collection Time: 10/20/20  5:33 PM   Specimen: Nasopharyngeal Swab; Nasopharyngeal(NP) swabs in vial transport medium  Result Value Ref Range Status   SARS Coronavirus 2 by RT PCR NEGATIVE NEGATIVE Final    Comment: (NOTE) SARS-CoV-2 target nucleic acids are NOT DETECTED.  The SARS-CoV-2 RNA is generally detectable in upper respiratory specimens during the acute phase of infection. The lowest concentration of SARS-CoV-2 viral copies this assay can detect is 138 copies/mL. A negative result does not preclude SARS-Cov-2 infection and should not be used as the sole basis for treatment or other patient management decisions. A negative result may occur with  improper specimen collection/handling, submission of specimen other than nasopharyngeal swab, presence of viral mutation(s)  within the areas targeted by this assay, and inadequate number of viral copies(<138 copies/mL). A negative result must be combined with clinical observations, patient history, and  epidemiological information. The expected result is Negative.  Fact Sheet for Patients:  EntrepreneurPulse.com.au  Fact Sheet for Healthcare Providers:  IncredibleEmployment.be  This test is no t yet approved or cleared by the [place] FDA and  has been authorized for detection and/or diagnosis of SARS-CoV-2 by FDA under an Emergency Use Authorization (EUA). This EUA will remain  in effect (meaning this test can be used) for the duration of the COVID-19 declaration under Section 564(b)(1) of the Act, 21 U.S.C.section 360bbb-3(b)(1), unless the authorization is terminated  or revoked sooner.       Influenza A by PCR NEGATIVE NEGATIVE Final   Influenza B by PCR NEGATIVE NEGATIVE Final    Comment: (NOTE) The Xpert Xpress SARS-CoV-2/FLU/RSV plus assay is intended as an aid in the diagnosis of influenza from Nasopharyngeal swab specimens and should not be used as a sole basis for treatment. Nasal washings and aspirates are unacceptable for Xpert Xpress SARS-CoV-2/FLU/RSV testing.  Fact Sheet for Patients: EntrepreneurPulse.com.au  Fact Sheet for Healthcare Providers: IncredibleEmployment.be  This test is not yet approved or cleared by the [place] FDA and has been authorized for detection and/or diagnosis of SARS-CoV-2 by FDA under an Emergency Use Authorization (EUA). This EUA will remain in effect (meaning this test can be used) for the duration of the COVID-19 declaration under Section 564(b)(1) of the Act, 21 U.S.C. section 360bbb-3(b)(1), unless the authorization is terminated or revoked.  Performed at [facility], [address] [place], [place] [address]   Blood Culture (routine x 2)     Status: None    Collection Time: 10/20/20  5:58 PM   Specimen: BLOOD  Result Value Ref Range Status   Specimen Description BLOOD BLOOD LEFT FOREARM  Final   Special Requests   Final    BOTTLES DRAWN AEROBIC AND ANAEROBIC Blood Culture results may not be optimal due to an excessive volume of blood received in culture bottles   Culture   Final    NO GROWTH 5 DAYS Performed at [facility], [address], [place], [place] [address]    Report Status 10/25/2020 FINAL  Final  Blood Culture (routine x 2)     Status: None   Collection Time: 10/20/20  5:58 PM   Specimen: BLOOD  Result Value Ref Range Status   Specimen Description BLOOD RIGHT ANTECUBITAL  Final   Special Requests   Final    BOTTLES DRAWN AEROBIC AND ANAEROBIC Blood Culture results may not be optimal due to an excessive volume of blood received in culture bottles   Culture   Final    NO GROWTH 5 DAYS Performed at [facility], [address] [place], [place] [address]    Report Status 10/25/2020 FINAL  Final  Urine Culture     Status: Abnormal   Collection Time: 10/20/20  8:20 PM   Specimen: Urine, Clean Catch  Result Value Ref Range Status   Specimen Description   Final    URINE, CLEAN CATCH Performed at [facility], [address] [place], [place] [address]    Special Requests   Final    NONE Performed at [facility], [address] [place], [place] [address]    Culture (A)  Final    >=100,000 COLONIES/mL STAPHYLOCOCCUS HAEMOLYTICUS >=100,000 COLONIES/mL VANCOMYCIN RESISTANT ENTEROCOCCUS    Report Status 10/24/2020 FINAL  Final   Organism ID, Bacteria STAPHYLOCOCCUS HAEMOLYTICUS (A)  Final   Organism ID, Bacteria VANCOMYCIN RESISTANT ENTEROCOCCUS (A)  Final  Susceptibility   Staphylococcus haemolyticus - MIC*    CIPROFLOXACIN >=8 RESISTANT Resistant     GENTAMICIN >=16 RESISTANT Resistant     NITROFURANTOIN <=16 SENSITIVE Sensitive     OXACILLIN >=4 RESISTANT Resistant      TETRACYCLINE 2 SENSITIVE Sensitive     VANCOMYCIN 1 SENSITIVE Sensitive     TRIMETH/SULFA >=320 RESISTANT Resistant     CLINDAMYCIN >=8 RESISTANT Resistant     RIFAMPIN >=32 RESISTANT Resistant     Inducible Clindamycin NEGATIVE Sensitive     * >=100,000 COLONIES/mL STAPHYLOCOCCUS HAEMOLYTICUS   Vancomycin resistant enterococcus - MIC*    AMPICILLIN >=32 RESISTANT Resistant     NITROFURANTOIN 64 INTERMEDIATE Intermediate     VANCOMYCIN >=32 RESISTANT Resistant     LINEZOLID 2 SENSITIVE Sensitive     * >=100,000 COLONIES/mL VANCOMYCIN RESISTANT ENTEROCOCCUS  Expectorated Sputum Assessment w Gram Stain, Rflx to Resp Cult     Status: None   Collection Time: 10/22/20  9:10 AM   Specimen: Expectorated Sputum  Result Value Ref Range Status   Specimen Description EXPECTORATED SPUTUM  Final   Special Requests NONE  Final   Sputum evaluation   Final    THIS SPECIMEN IS ACCEPTABLE FOR SPUTUM CULTURE Performed at [facility], [address], [place], [place] [address]    Report Status 10/22/2020 FINAL  Final  Culture, Respiratory w Gram Stain     Status: None   Collection Time: 10/22/20  9:10 AM  Result Value Ref Range Status   Specimen Description   Final    EXPECTORATED SPUTUM Performed at [facility], [address] [place], [place] [address]    Special Requests   Final    NONE Reflexed from [id number] Performed at [facility], [address] [place], [place] [address]    Gram Stain   Final    MODERATE WBC PRESENT,BOTH PMN AND MONONUCLEAR RARE GRAM NEGATIVE RODS RARE GRAM POSITIVE COCCI    Culture   Final    FEW Normal respiratory flora-no Staph aureus or Pseudomonas seen Performed at [facility], [address] [place], [place] [address]    Report Status 10/24/2020 FINAL  Final  MRSA Next Gen by PCR, Nasal     Status: None   Collection Time: 10/28/20 12:30 PM   Specimen: Nasal Mucosa; Nasal Swab  Result Value Ref Range Status   MRSA by  PCR Next Gen NOT DETECTED NOT DETECTED Final    Comment: (NOTE) The GeneXpert MRSA Assay (FDA approved for NASAL specimens only), is one component of a comprehensive MRSA colonization surveillance program. It is not intended to diagnose MRSA infection nor to guide or monitor treatment for MRSA infections. Test performance is not FDA approved in patients less than 76 years old. Performed at [facility], [address] [place], [place] [address]     Radiology Studies: No results found.  Scheduled Meds:  apixaban  5 mg Oral BID   aspirin EC  81 mg Oral QPC supper   atorvastatin  40 mg Oral QPC supper   famotidine  20 mg Oral Daily   ferrous gluconate  324 mg Oral QPC supper   furosemide  20 mg Intravenous Q12H   guaiFENesin  600 mg Oral BID   ipratropium-albuterol  3 mL Nebulization BID   loratadine  10 mg Oral Daily   methylPREDNISolone (SOLU-MEDROL) injection  40 mg Intravenous Q24H   metoprolol succinate  12.5 mg Oral BID   montelukast  10 mg Oral QHS   pantoprazole  40 mg Oral QPC supper   tamsulosin  0.4 mg Oral Daily   Continuous Infusions:  sodium chloride Stopped (10/28/20 1224)   linezolid (ZYVOX) IV 600 mg (10/29/20 0950)     LOS: 9 days    Time spent: 25 mins    Yonis Carreon, MD [facility]   If 7PM-7AM, please contact night-coverage

## 2020-10-29 NOTE — Progress Notes (Signed)
Pulmonary Medicine          Date: 10/29/2020,   MRN# [id number] [name] Browning 09/29/1940     AdmissionWeight: 81.6 kg                 CurrentWeight: 81.6 kg  Referring physician: Dr Kurtis Bushman     CHIEF COMPLAINT:   Acute on chronic hypoxemic respiratory failure    HISTORY OF PRESENT ILLNESS   [name] Browning is a 80 y.o. male with medical history significant for COPD with chronic hypoxemia on 2 L, paroxysmal atrial fibrillation on Eliquis, hypertension, TIA and recent nephrolithiasis s/p ureteral stent who presents with concerns of weakness and increasing shortness of breath. Patient recently hospitalized in September 2022 for abdominal pain and shortness of breath.  He was found to have COPD with pneumonia.  He also had obstructing left proximal ureteral stone with left-sided hydronephrosis, AKI and underwent left ureteral stent by urology on 9/11.  Subsequently following procedure he developed interstitial edema and hypotension requiring ICU/SDM BiPAP.Patient reports that since returning home from this admission he has not felt back to baseline.  Has continue progression of shortness of breath and could barely walk down the hall at home.  Has been feeling weak.  Has cough.  Daughter notes temperature of 103F day.In the ED, he was febrile up to 101.1, hypotensive down to 80s over 50s, tachycardic heart rate of 109, tachypneic RR of 24 and was on nonrebreather with 4L at the time my evaluation.CBC showed no leukocytosis, hemoglobin stable at 10.8, platelet 147, sodium 139, potassium 3.9, BG of 110. UA shows moderate leukocyte, negative nitrite but no bacteria. Chest x-ray with bilateral infiltrate edema versus pneumonia.  PCCM consult for additional evaluation and management CXR reviewed by me and is + for edema bilaterally. He has 2+ peripheral edema.   I SPOKE WITH [name] Browning next of kin for patient and we reviewed medical plan and hospital course  10/28/20-  patient stable , no  new complaints no acute events overnight conitnue current care plan.   10/29/20- patient made 3L urine output he feels less dyspneic and is on 3L/min [place] and states he feels less labored from respiratory perspective. Have stopped losartan and will keep diuresing today. Wil decrease steroids to 40  PAST MEDICAL HISTORY   Past Medical History:  Diagnosis Date   Anginal pain ([place])    Anxiety    Aortic atherosclerosis ([place])    Atrial fibrillation ([place]) 01/2019   Bilateral carpal tunnel syndrome    CAD (coronary artery disease)    a.) LHC 2004 --> normal coronaries. b.) normal stress test in 2007 and 2011; c.) Lexiscan 05/20/2014 --> LVEF 55-65%; no significant stress induced ischemia/arrythmia. d.) CT chest 03/25/2019 --> coronaries carcified.   Carotid atherosclerosis, bilateral    Carpal tunnel syndrome, bilateral    Chronic anticoagulation    a.) ASA + apixaban   COPD (chronic obstructive pulmonary disease) (HCC)    CVA (cerebral vascular accident) ([place])    Degenerative disc disease, cervical    Diastolic dysfunction    a.) TTE 05/29/2014 --> LVEF 60-65%; G1DD.   DVT (deep venous thrombosis) (HCC)    GERD (gastroesophageal reflux disease)    History of 2019 novel coronavirus disease (COVID-19) 02/08/2019   HLD (hyperlipidemia)    Hypertension    Kidney stones    Osteoarthritis of right shoulder    Pneumonia    Respiratory failure, acute ([place]) 09/20/2020   a.) severe respiratory distress  1 hour after urological surgery. CXR (+) for acute pulmonary edema. Transferred to ICU and placed on NIPPV. Questionable aspiration PNA. (+) A.fib with RVR. Improved with ABX, diuresis, and amiodarone.   Skin cancer of face    a.) RIGHT ear and RIGHT forehead; excised.   Syncope    TIA (transient ischemic attack) 2016   Valvular regurgitation    a.) TTE 05/26/2014 --> LVEF 60-65%; trivial MR, mild TR; no AR or PR. b.) TTE 04/20/2015 --> LVEF 55-60%; trivial MR and PR; no AR or TR.     SURGICAL  HISTORY   Past Surgical History:  Procedure Laterality Date   CARDIAC CATHETERIZATION  2004   CARPAL TUNNEL RELEASE Right 06/11/2013   CYSTOSCOPY W/ RETROGRADES  10/16/2020   Procedure: CYSTOSCOPY WITH RETROGRADE PYELOGRAM;  Surgeon: Billey Co, MD;  Location: [facility] ORS;  Service: Urology;;   CYSTOSCOPY W/ URETERAL STENT PLACEMENT Left 09/20/2020   Procedure: CYSTOSCOPY WITH RETROGRADE PYELOGRAM/URETERAL STENT PLACEMENT;  Surgeon: Janith Lima, MD;  Location: [facility] OR[facility];  Service: Urology;  Laterality: Left;   CYSTOSCOPY/URETEROSCOPY/HOLMIUM LASER/STENT PLACEMENT Left 10/16/2020   Procedure: CYSTOSCOPY/URETEROSCOPY/HOLMIUM LASER/STENT PLACEMENT;  Surgeon: Billey Co, MD;  Location: [facility] OR[facility];  Service: Urology;  Laterality: Left;   SHOULDER ARTHROSCOPY WITH OPEN ROTATOR CUFF REPAIR Right 08/24/2017   Procedure: SHOULDER ARTHROSCOPY WITH OPEN ROTATOR CUFF REPAIR;  Surgeon: Corky Mull, MD;  Location: [facility] ORS;  Service: Orthopedics;  Laterality: Right;   SKIN CANCER EXCISION  12/01/2016   right ear    SKIN CANCER EXCISION     remove from the right side of the face    THROMBECTOMY Right 2004   leg   THYROIDECTOMY  1950   Not sure if total or partial thyroidectomy.     FAMILY HISTORY   Family History  Problem Relation Age of Onset   Hypertension Mother    Heart disease Mother    CAD Father    Heart attack Father      SOCIAL HISTORY   Social History   Tobacco Use   Smoking status: Former    Packs/day: 1.00    Years: 46.00    Pack years: 46.00    Types: Cigarettes    Start date: 01/10/1957    Quit date: 02/11/2003    Years since quitting: 17.7   Smokeless tobacco: Former    Types: Snuff    Quit date: 02/11/2003  Vaping Use   Vaping Use: Never used  Substance Use Topics   Alcohol use: Not Currently    Alcohol/week: 7.0 standard drinks    Types: 7 Cans of beer per week   Drug use: No     MEDICATIONS    Home Medication:    Current Medication:  Current  Facility-Administered Medications:    0.9 %  sodium chloride infusion, 250 mL, Intravenous, Continuous, [place]uma, Bing Neighbors, NP, Stopped at 10/28/20 1224   acetaminophen (TYLENOL) tablet 650 mg, 650 mg, Oral, Q6H PRN, Tu, Ching T, DO   albuterol (PROVENTIL) (2.5 MG/3ML) 0.083% nebulizer solution 2.5 mg, 2.5 mg, Nebulization, Q4H PRN, Nolberto Hanlon, MD   apixaban (ELIQUIS) tablet 5 mg, 5 mg, Oral, BID, Tu, Ching T, DO, 5 mg at 10/29/20 0946   aspirin EC tablet 81 mg, 81 mg, Oral, QPC supper, Tu, Ching T, DO, 81 mg at 10/28/20 1704   atorvastatin (LIPITOR) tablet 40 mg, 40 mg, Oral, QPC supper, Tu, Ching T, DO, 40 mg at 10/28/20 1704   budesonide (PULMICORT) nebulizer solution 0.25  mg, 0.25 mg, Nebulization, BID, Ouma, Bing Neighbors, NP, 0.25 mg at 10/29/20 0800   famotidine (PEPCID) tablet 20 mg, 20 mg, Oral, Daily, Tu, Ching T, DO, 20 mg at 10/29/20 0946   ferrous gluconate (FERGON) tablet 324 mg, 324 mg, Oral, QPC supper, Tu, Ching T, DO, 324 mg at 10/28/20 1704   furosemide (LASIX) injection 20 mg, 20 mg, Intravenous, Q12H, Lanney Gins, Jann Ra, MD, 20 mg at 10/29/20 0219   guaiFENesin (MUCINEX) 12 hr tablet 600 mg, 600 mg, Oral, BID, Nolberto Hanlon, MD, 600 mg at 10/29/20 0946   guaiFENesin (ROBITUSSIN) 100 MG/5ML liquid 10 mL, 10 mL, Oral, Q4H PRN, Nolberto Hanlon, MD, 10 mL at 10/28/20 3570   ipratropium-albuterol (DUONEB) 0.5-2.5 (3) MG/3ML nebulizer solution 3 mL, 3 mL, Nebulization, BID, Kurtis Bushman, Sahar, MD, 3 mL at 10/29/20 0800   linezolid (ZYVOX) IVPB 600 mg, 600 mg, Intravenous, Q12H, Shawna Clamp, MD, Last Rate: 300 mL/hr at 10/29/20 0950, 600 mg at 10/29/20 0950   loratadine (CLARITIN) tablet 10 mg, 10 mg, Oral, Daily, Tu, Ching T, DO, 10 mg at 10/29/20 0946   losartan (COZAAR) tablet 50 mg, 50 mg, Oral, Daily, Shawna Clamp, MD   methylPREDNISolone sodium succinate (SOLU-MEDROL) 125 mg/2 mL injection 60 mg, 60 mg, Intravenous, Q24H, Maysun Meditz, MD, 60 mg at 10/28/20 1402    metoprolol succinate (TOPROL-XL) 24 hr tablet 12.5 mg, 12.5 mg, Oral, BID, Kurtis Bushman, Sahar, MD, 12.5 mg at 10/29/20 0946   montelukast (SINGULAIR) tablet 10 mg, 10 mg, Oral, QHS, Tu, Ching T, DO, 10 mg at 10/28/20 2152   pantoprazole (PROTONIX) EC tablet 40 mg, 40 mg, Oral, QPC supper, Tu, Ching T, DO, 40 mg at 10/28/20 1704   phenazopyridine (PYRIDIUM) tablet 200 mg, 200 mg, Oral, TID PRN, Tu, Ching T, DO   tamsulosin (FLOMAX) capsule 0.4 mg, 0.4 mg, Oral, Daily, Tu, Ching T, DO, 0.4 mg at 10/29/20 0946    ALLERGIES   Hydromorphone     REVIEW OF SYSTEMS    Review of Systems:  Gen:  Denies  fever, sweats, chills weigh loss  HEENT: Denies blurred vision, double vision, ear pain, eye pain, hearing loss, nose bleeds, sore throat Cardiac:  No dizziness, chest pain or heaviness, chest tightness,edema Resp:   Denies cough or sputum porduction, shortness of breath,wheezing, hemoptysis,  Gi: Denies swallowing difficulty, stomach pain, nausea or vomiting, diarrhea, constipation, bowel incontinence Gu:  Denies bladder incontinence, burning urine Ext:   Denies Joint pain, stiffness or swelling Skin: Denies  skin rash, easy bruising or bleeding or hives Endoc:  Denies polyuria, polydipsia , polyphagia or weight change Psych:   Denies depression, insomnia or hallucinations   Other:  All other systems negative   VS: BP 140/67   Pulse 78   Temp (!) 97.5 F (36.4 C) (Oral)   Resp 18   Ht _0  (1.88 m)   Wt 81.6 kg   SpO2 99%   BMI 23.11 kg/m      PHYSICAL EXAM    GENERAL:NAD, no fevers, chills, no weakness no fatigue HEAD: Normocephalic, atraumatic.  EYES: Pupils equal, round, reactive to light. Extraocular muscles intact. No scleral icterus.  MOUTH: Moist mucosal membrane. Dentition intact. No abscess noted.  EAR, NOSE, THROAT: Clear without exudates. No external lesions.  NECK: Supple. No thyromegaly. No nodules. No JVD.  PULMONARY: bilateral rhonchi CARDIOVASCULAR: S1 and  S2. Irregular rate and rhythm. No murmurs, rubs, or gallops. No edema. Pedal pulses 2+ bilaterally.  GASTROINTESTINAL: Soft, nontender, nondistended. No masses.  Positive bowel sounds. No hepatosplenomegaly.  MUSCULOSKELETAL: No swelling, clubbing, or edema. Range of motion full in all extremities.  NEUROLOGIC: Cranial nerves II through XII are intact. No gross focal neurological deficits. Sensation intact. Reflexes intact.  SKIN: No ulceration, lesions, rashes, or cyanosis. Skin warm and dry. Turgor intact.  PSYCHIATRIC: Mood, affect within normal limits. The patient is awake, alert and oriented x 3. Insight, judgment intact.       IMAGING        DG Chest 2 View  Result Date: 10/06/2020 CLINICAL DATA:  Shortness of breath. EXAM: CHEST - 2 VIEW COMPARISON:  September 27, 2020. FINDINGS: The heart size and mediastinal contours are within normal limits. Stable reticular densities are noted throughout both lungs consistent with scarring or fibrosis, but acute superimposed inflammation can not be excluded. The visualized skeletal structures are unremarkable. IMPRESSION: Stable reticular densities are noted throughout both lungs consistent with scarring or fibrosis, but acute superimposed inflammation can not be excluded. Electronically Signed   By: Marijo Conception M.D.   On: 10/06/2020 16:54   DG Abdomen 1 View  Result Date: 10/20/2020 CLINICAL DATA:  Sepsis.  Evaluate for stent placement. EXAM: ABDOMEN - 1 VIEW COMPARISON:  Fluoroscopy 10/16/2020 FINDINGS: A left ureteral stent is present. The proximal pigtail projects to the left of L3. This is lower than on the previous fluoroscopic image. The distal pigtail projects over the perineum below the level of the symphysis pubis. This suggests inferior migration of the stent with the distal pigtail probably either in the urethra or possibly in a prolapsed bladder. Scattered gas and stool throughout the colon and small bowel. No small or large bowel  distention. No radiopaque stones are identified. Degenerative changes in the spine and hips. IMPRESSION: Left ureteral stent is present. The stent appears to have migrated inferiorly with the proximal pigtail lower than on prior study and the distal pigtail projecting below the level of the symphysis pubis, possibly in the urethra or in a prolapsed bladder. Electronically Signed   By: Lucienne Capers M.D.   On: 10/20/2020 20:18   DG Chest Port 1 View  Result Date: 10/27/2020 CLINICAL DATA:  Chronic hypoxemia, COPD presents with weakness and shortness of breath EXAM: PORTABLE CHEST 1 VIEW COMPARISON:  Chest radiograph 10/23/2020 FINDINGS: The cardiomediastinal silhouette is stable. There are diffusely coarsened interstitial markings throughout both lungs, overall not significantly changed since 10/23/2020. There is no new or worsening focal airspace disease. There is no pleural effusion. There is no pneumothorax. There is no acute osseous abnormality. IMPRESSION: Unchanged coarsened interstitial markings throughout both lungs since 10/23/2020 though these are increased since 10/06/2020. Findings may reflect edema or infection superimposed on chronic changes. Electronically Signed   By: Valetta Mole M.D.   On: 10/27/2020 09:05   DG Chest Port 1 View  Result Date: 10/23/2020 CLINICAL DATA:  Respiratory failure, increasing shortness of breath EXAM: PORTABLE CHEST 1 VIEW COMPARISON:  10/20/2020 FINDINGS: Unchanged mildly enlarged cardiac contour. Redemonstrated diffuse airspace and interstitial opacities bilaterally, which appear largely unchanged compared to the prior exam. No definite pleural effusion. Aortic calcifications. No acute osseous abnormality. IMPRESSION: Unchanged bilateral pulmonary opacities, which could represent edema or infection. Electronically Signed   By: Merilyn Baba M.D.   On: 10/23/2020 17:49   DG Chest Port 1 View  Result Date: 10/20/2020 CLINICAL DATA:  Questionable sepsis.   Evaluate for abnormality. EXAM: PORTABLE CHEST 1 VIEW COMPARISON:  10/06/2020 FINDINGS: Mild cardiac enlargement. Diffuse airspace and interstitial infiltration  throughout both lungs, progressing since prior study. This may represent edema or multifocal pneumonia. No pleural effusions. No pneumothorax. Mediastinal contours appear intact. Calcification of the aorta. Degenerative changes in the spine and shoulders. IMPRESSION: Increasing bilateral pulmonary infiltrates, likely edema or pneumonia. Electronically Signed   By: Lucienne Capers M.D.   On: 10/20/2020 19:08   DG OR UROLOGY CYSTO IMAGE ([facility] ONLY)  Result Date: 10/16/2020 There is no interpretation for this exam.  This order is for images obtained during a surgical procedure.  Please See "Surgeries" Tab for more information regarding the procedure.   ECHOCARDIOGRAM COMPLETE  Result Date: 10/23/2020    ECHOCARDIOGRAM REPORT   Patient Name:   SANDEEP RADELL Date of Exam: 10/23/2020 Medical Rec #:  [id number]     Height:       74.0 in Accession #:    [id number]    Weight:       180.0 lb Date of Birth:  [birth year]     BSA:          2.078 m Patient Age:    14 years      BP:           133/80 mmHg Patient Gender: M             HR:           95 bpm. Exam Location:  [facility] Procedure: 2D Echo, Cardiac Doppler and Color Doppler Indications:     CHF-acute diastolic J03.00  History:         Patient has prior history of Echocardiogram examinations, most                  recent 04/20/2015. Angina, COPD and TIA; Signs/Symptoms:Syncope.  Sonographer:     Sherrie Sport Referring Phys:  [id number] [place] Diagnosing Phys: Ida Rogue MD  Sonographer Comments: Suboptimal apical window. IMPRESSIONS  1. Left ventricular ejection fraction, by estimation, is 60 to 65%. The left ventricle has normal function. The left ventricle has no regional wall motion abnormalities. Left ventricular diastolic parameters are indeterminate.  2. Right ventricular systolic function is normal.  The right ventricular size is normal. There is mildly elevated pulmonary artery systolic pressure.  3. Rhythm is atrial fibrillation  4. Left atrial size was moderately dilated. FINDINGS  Left Ventricle: Left ventricular ejection fraction, by estimation, is 60 to 65%. The left ventricle has normal function. The left ventricle has no regional wall motion abnormalities. The left ventricular internal cavity size was normal in size. There is  no left ventricular hypertrophy. Left ventricular diastolic parameters are indeterminate. Right Ventricle: The right ventricular size is normal. No increase in right ventricular wall thickness. Right ventricular systolic function is normal. There is mildly elevated pulmonary artery systolic pressure. The tricuspid regurgitant velocity is 2.95  m/s, and with an assumed right atrial pressure of 5 mmHg, the estimated right ventricular systolic pressure is 62.2 mmHg. Left Atrium: Left atrial size was moderately dilated. Right Atrium: Right atrial size was normal in size. Pericardium: There is no evidence of pericardial effusion. Mitral Valve: The mitral valve is normal in structure. There is mild thickening of the mitral valve leaflet(s). No evidence of mitral valve regurgitation. No evidence of mitral valve stenosis. Tricuspid Valve: The tricuspid valve is normal in structure. Tricuspid valve regurgitation is mild . No evidence of tricuspid stenosis. Aortic Valve: The aortic valve is normal in structure. Aortic valve regurgitation is not visualized. No aortic stenosis is present. Aortic valve mean gradient  measures 2.0 mmHg. Aortic valve peak gradient measures 4.7 mmHg. Aortic valve area, by VTI measures 3.04 cm. Pulmonic Valve: The pulmonic valve was normal in structure. Pulmonic valve regurgitation is not visualized. No evidence of pulmonic stenosis. Aorta: The aortic root is normal in size and structure. Venous: The inferior vena cava is normal in size with greater than 50%  respiratory variability, suggesting right atrial pressure of 3 mmHg. IAS/Shunts: No atrial level shunt detected by color flow Doppler.  LEFT VENTRICLE PLAX 2D LVIDd:         4.19 cm LVIDs:         2.93 cm LV PW:         1.33 cm LV IVS:        0.95 cm LVOT diam:     2.00 cm LV SV:         55 LV SV Index:   26 LVOT Area:     3.14 cm  RIGHT VENTRICLE RV Basal diam:  4.55 cm RV S prime:     7.51 cm/s TAPSE (M-mode): 3.8 cm LEFT ATRIUM              Index        RIGHT ATRIUM           Index LA diam:        3.50 cm  1.68 cm/m   RA Area:     26.60 cm LA Vol (A2C):   93.4 ml  44.94 ml/m  RA Volume:   96.30 ml  46.34 ml/m LA Vol (A4C):   128.0 ml 61.59 ml/m LA Biplane Vol: 113.0 ml 54.37 ml/m  AORTIC VALVE                    PULMONIC VALVE AV Area (Vmax):    2.83 cm     PV Vmax:        0.96 m/s AV Area (Vmean):   2.84 cm     PV Peak grad:   3.6 mmHg AV Area (VTI):     3.04 cm     RVOT Peak grad: 4 mmHg AV Vmax:           108.00 cm/s AV Vmean:          70.100 cm/s AV VTI:            0.181 m AV Peak Grad:      4.7 mmHg AV Mean Grad:      2.0 mmHg LVOT Vmax:         97.30 cm/s LVOT Vmean:        63.300 cm/s LVOT VTI:          0.175 m LVOT/AV VTI ratio: 0.97  AORTA Ao Root diam: 3.30 cm MITRAL VALVE                TRICUSPID VALVE MV Area (PHT): 4.52 cm     TR Peak grad:   34.8 mmHg MV Decel Time: 168 msec     TR Vmax:        295.00 cm/s MV E velocity: 134.00 cm/s                             SHUNTS                             Systemic VTI:  0.18 m  Systemic Diam: 2.00 cm Ida Rogue MD Electronically signed by Ida Rogue MD Signature Date/Time: 10/23/2020/5:30:24 PM    Final    CT Renal Stone Study  Result Date: 10/20/2020 CLINICAL DATA:  Intermittent weakness, feeling as if he would faint. EXAM: CT ABDOMEN AND PELVIS WITHOUT CONTRAST TECHNIQUE: Multidetector CT imaging of the abdomen and pelvis was performed following the standard protocol without IV contrast. COMPARISON:   September 18, 2020 FINDINGS: Lower chest: Moderate severity chronic appearing fibrotic changes are seen involving the bilateral lung bases. Small bilateral pleural effusions are noted. Hepatobiliary: No focal liver abnormality is seen. No gallstones, gallbladder wall thickening, or biliary dilatation. Pancreas: Unremarkable. No pancreatic ductal dilatation or surrounding inflammatory changes. Spleen: A punctate calcified granuloma is seen within an otherwise normal-appearing spleen. Adrenals/Urinary Tract: Adrenal glands are unremarkable. Kidneys are normal in size, without focal lesions. A 2 mm nonobstructing renal stone is seen within the upper pole of the right kidney. Clusters of subcentimeter nonobstructing renal stones are seen within the mid and lower left kidney. A left-sided endo ureteral stent is in place. The proximal portion of the stent is seen within the proximal left ureter, just beyond the left UPJ. The distal end extends into the urinary bladder, continues through the prostate gland in serpentine fashion to the proximal to mid portion of the urethra (axial CT image 97, CT series 2). The urinary bladder is empty and subsequently limited in evaluation. Stomach/Bowel: Stomach is within normal limits. Appendix appears normal. No evidence of bowel dilatation. Numerous diverticula are seen within the descending and sigmoid colon. Vascular/Lymphatic: Aortic atherosclerosis. No enlarged abdominal or pelvic lymph nodes. Reproductive: The prostate gland is moderately enlarged. A moderate amount of prostate gland calcification is seen. Other: A 4.4 cm x 2.7 cm fat containing right inguinal hernia is seen. No abdominopelvic ascites. Musculoskeletal: Multilevel degenerative changes seen throughout the lumbar spine. IMPRESSION: 1. Left-sided endo ureteral stent which may be malpositioned, as described above. Confirmation of the desired positioning is recommended. 2. Bilateral nonobstructing renal stones. 3. Small  bilateral pleural effusions. 4. Colonic diverticulosis. Aortic Atherosclerosis (ICD10-I70.0). Electronically Signed   By: Virgina Norfolk M.D.   On: 10/20/2020 21:54      ASSESSMENT/PLAN   Acute on chronic hypoxemic respiratory failure - present on admission  - COVID19 negative  -MRSA nasal PCR -procalcitonin trend - supplemental O2 during my evaluation 3L/min - will perform infectious workup for pneumonia -Respiratory viral panel -serum fungitell -legionella ab -strep pneumoniae ur AG -Histoplasma Ur Ag -sputum resp cultures -AFB sputum expectorated specimen -sputum cytology  -reviewed pertinent imaging with patient today - ESR -PT/OT for d/c planning  -please encourage patient to use incentive spirometer few times each hour while hospitalized.   -agree with zyvox -adding lasix 20 bid IV -reducing solumedrol to 60 iv daily       Thank you for allowing me to participate in the care of this patient.  Total face to face encounter time for this patient visit was >14mn. >50% of the time was  spent in counseling and coordination of care.   Patient/Family are satisfied with care plan and all questions have been answered.  This document was prepared using Dragon voice recognition software and may include unintentional dictation errors.     FOttie Glazier M.D.  [facility]

## 2020-10-30 DIAGNOSIS — J9621 Acute and chronic respiratory failure with hypoxia: Secondary | ICD-10-CM | POA: Diagnosis not present

## 2020-10-30 LAB — URINALYSIS, COMPLETE (UACMP) WITH MICROSCOPIC
Bilirubin Urine: NEGATIVE
Glucose, UA: NEGATIVE mg/dL
Ketones, ur: NEGATIVE mg/dL
Leukocytes,Ua: NEGATIVE
Nitrite: NEGATIVE
Protein, ur: 30 mg/dL — AB
RBC / HPF: >50 RBC/hpf — ABNORMAL HIGH (ref 0–5)
Specific Gravity, Urine: 1.008 (ref 1.005–1.030)
Squamous Epithelial / HPF: NONE SEEN (ref 0–5)
pH: 5 (ref 5.0–8.0)

## 2020-10-30 LAB — MISC LABCORP TEST (SEND OUT): Labcorp test code: 139650

## 2020-10-30 MED ORDER — SIMETHICONE 80 MG PO CHEW
160.0000 mg | CHEWABLE_TABLET | Freq: Once | ORAL | Status: AC
  Administered 2020-10-30: 160 mg via ORAL
  Filled 2020-10-30: qty 2

## 2020-10-30 MED ORDER — LINEZOLID 600 MG PO TABS
600.0000 mg | ORAL_TABLET | Freq: Two times a day (BID) | ORAL | Status: DC
  Administered 2020-10-30 – 2020-10-31 (×2): 600 mg via ORAL
  Filled 2020-10-30 (×4): qty 1

## 2020-10-30 MED ORDER — PREDNISONE 20 MG PO TABS
50.0000 mg | ORAL_TABLET | Freq: Every day | ORAL | Status: DC
  Administered 2020-10-31: 50 mg via ORAL
  Filled 2020-10-30: qty 3

## 2020-10-30 MED ORDER — FUROSEMIDE 10 MG/ML IJ SOLN
20.0000 mg | Freq: Two times a day (BID) | INTRAMUSCULAR | Status: DC
  Administered 2020-10-30 – 2020-10-31 (×3): 20 mg via INTRAVENOUS
  Filled 2020-10-30: qty 4
  Filled 2020-10-30 (×2): qty 2

## 2020-10-30 NOTE — Progress Notes (Signed)
PT Cancellation Note  Patient Details Name: [name] Browning MRN: [id number] DOB: [birth year]   Cancelled Treatment:     PT attempt. Pt is process of being moved to step down unit. Acute PT will continue to follow and progress as  able per current POC. Author will return tomorrow to get pt OOB and promote increased physical activity.    [place] Pa 10/30/2020, 4:47 PM

## 2020-10-30 NOTE — Progress Notes (Signed)
Pulmonary Medicine          Date: 10/30/2020,   MRN# [id number] [name] Browning [birth year]     AdmissionWeight: 81.6 kg                 CurrentWeight: 81.6 kg  Referring physician: Dr Kurtis Bushman     CHIEF COMPLAINT:   Acute on chronic hypoxemic respiratory failure    HISTORY OF PRESENT ILLNESS   [name] Browning is a 80 y.o. male with medical history significant for COPD with chronic hypoxemia on 2 L, paroxysmal atrial fibrillation on Eliquis, hypertension, TIA and recent nephrolithiasis s/p ureteral stent who presents with concerns of weakness and increasing shortness of breath. Patient recently hospitalized in September 2022 for abdominal pain and shortness of breath.  He was found to have COPD with pneumonia.  He also had obstructing left proximal ureteral stone with left-sided hydronephrosis, AKI and underwent left ureteral stent by urology on 9/11.  Subsequently following procedure he developed interstitial edema and hypotension requiring ICU/SDM BiPAP.Patient reports that since returning home from this admission he has not felt back to baseline.  Has continue progression of shortness of breath and could barely walk down the hall at home.  Has been feeling weak.  Has cough.  Daughter notes temperature of 103F day.In the ED, he was febrile up to 101.1, hypotensive down to 80s over 50s, tachycardic heart rate of 109, tachypneic RR of 24 and was on nonrebreather with 4L at the time my evaluation.CBC showed no leukocytosis, hemoglobin stable at 10.8, platelet 147, sodium 139, potassium 3.9, BG of 110. UA shows moderate leukocyte, negative nitrite but no bacteria. Chest x-ray with bilateral infiltrate edema versus pneumonia.  PCCM consult for additional evaluation and management CXR reviewed by me and is + for edema bilaterally. He has 2+ peripheral edema.   I SPOKE WITH [name] Browning next of kin for patient and we reviewed medical plan and hospital course  10/28/20-  patient stable , no  new complaints no acute events overnight conitnue current care plan.   10/29/20- patient made 3L urine output he feels less dyspneic and is on 3L/min [place] and states he feels less labored from respiratory perspective. Have stopped losartan and will keep diuresing today. Wil decrease steroids to 40  PAST MEDICAL HISTORY   Past Medical History:  Diagnosis Date   Anginal pain ([place])    Anxiety    Aortic atherosclerosis ([place])    Atrial fibrillation ([place]) 01/2019   Bilateral carpal tunnel syndrome    CAD (coronary artery disease)    a.) LHC 2004 --> normal coronaries. b.) normal stress test in 2007 and 2011; c.) Lexiscan 05/20/2014 --> LVEF 55-65%; no significant stress induced ischemia/arrythmia. d.) CT chest 03/25/2019 --> coronaries carcified.   Carotid atherosclerosis, bilateral    Carpal tunnel syndrome, bilateral    Chronic anticoagulation    a.) ASA + apixaban   COPD (chronic obstructive pulmonary disease) (HCC)    CVA (cerebral vascular accident) ([place])    Degenerative disc disease, cervical    Diastolic dysfunction    a.) TTE 05/29/2014 --> LVEF 60-65%; G1DD.   DVT (deep venous thrombosis) (HCC)    GERD (gastroesophageal reflux disease)    History of 2019 novel coronavirus disease (COVID-19) 02/08/2019   HLD (hyperlipidemia)    Hypertension    Kidney stones    Osteoarthritis of right shoulder    Pneumonia    Respiratory failure, acute ([place]) 09/20/2020   a.) severe respiratory distress  1 hour after urological surgery. CXR (+) for acute pulmonary edema. Transferred to ICU and placed on NIPPV. Questionable aspiration PNA. (+) A.fib with RVR. Improved with ABX, diuresis, and amiodarone.   Skin cancer of face    a.) RIGHT ear and RIGHT forehead; excised.   Syncope    TIA (transient ischemic attack) 2016   Valvular regurgitation    a.) TTE 05/26/2014 --> LVEF 60-65%; trivial MR, mild TR; no AR or PR. b.) TTE 04/20/2015 --> LVEF 55-60%; trivial MR and PR; no AR or TR.     SURGICAL  HISTORY   Past Surgical History:  Procedure Laterality Date   CARDIAC CATHETERIZATION  2004   CARPAL TUNNEL RELEASE Right 06/11/2013   CYSTOSCOPY W/ RETROGRADES  10/16/2020   Procedure: CYSTOSCOPY WITH RETROGRADE PYELOGRAM;  Surgeon: Billey Co, MD;  Location: [facility] ORS;  Service: Urology;;   CYSTOSCOPY W/ URETERAL STENT PLACEMENT Left 09/20/2020   Procedure: CYSTOSCOPY WITH RETROGRADE PYELOGRAM/URETERAL STENT PLACEMENT;  Surgeon: Janith Lima, MD;  Location: [facility] ORS;  Service: Urology;  Laterality: Left;   CYSTOSCOPY/URETEROSCOPY/HOLMIUM LASER/STENT PLACEMENT Left 10/16/2020   Procedure: CYSTOSCOPY/URETEROSCOPY/HOLMIUM LASER/STENT PLACEMENT;  Surgeon: Billey Co, MD;  Location: [facility] ORS;  Service: Urology;  Laterality: Left;   SHOULDER ARTHROSCOPY WITH OPEN ROTATOR CUFF REPAIR Right 08/24/2017   Procedure: SHOULDER ARTHROSCOPY WITH OPEN ROTATOR CUFF REPAIR;  Surgeon: Corky Mull, MD;  Location: [facility] ORS;  Service: Orthopedics;  Laterality: Right;   SKIN CANCER EXCISION  12/01/2016   right ear    SKIN CANCER EXCISION     remove from the right side of the face    THROMBECTOMY Right 2004   leg   THYROIDECTOMY  1950   Not sure if total or partial thyroidectomy.     FAMILY HISTORY   Family History  Problem Relation Age of Onset   Hypertension Mother    Heart disease Mother    CAD Father    Heart attack Father      SOCIAL HISTORY   Social History   Tobacco Use   Smoking status: Former    Packs/day: 1.00    Years: 46.00    Pack years: 46.00    Types: Cigarettes    Start date: 01/10/1957    Quit date: 02/11/2003    Years since quitting: 17.7   Smokeless tobacco: Former    Types: Snuff    Quit date: 02/11/2003  Vaping Use   Vaping Use: Never used  Substance Use Topics   Alcohol use: Not Currently    Alcohol/week: 7.0 standard drinks    Types: 7 Cans of beer per week   Drug use: No     MEDICATIONS    Home Medication:    Current Medication:  Current  Facility-Administered Medications:    0.9 %  sodium chloride infusion, 250 mL, Intravenous, Continuous, [name] Browning, [name] Neighbors, NP, Stopped at 10/28/20 1224   acetaminophen (TYLENOL) tablet 650 mg, 650 mg, Oral, Q6H PRN, [name] Browning, [name] T, DO   albuterol (PROVENTIL) (2.5 MG/3ML) 0.083% nebulizer solution 2.5 mg, 2.5 mg, Nebulization, Q4H PRN, Nolberto Hanlon, MD   apixaban (ELIQUIS) tablet 5 mg, 5 mg, Oral, BID, [name] Browning, [name] T, DO, 5 mg at 10/29/20 2137   aspirin EC tablet 81 mg, 81 mg, Oral, QPC supper, [name] Browning, [name] T, DO, 81 mg at 10/29/20 1731   atorvastatin (LIPITOR) tablet 40 mg, 40 mg, Oral, QPC supper, [name] Browning, [name] T, DO, 40 mg at 10/29/20 1731   famotidine (PEPCID) tablet 20 mg,  20 mg, Oral, Daily, [name] Browning, [name] T, DO, 20 mg at 10/29/20 0946   ferrous gluconate (FERGON) tablet 324 mg, 324 mg, Oral, QPC supper, [name] Browning, [name] T, DO, 324 mg at 10/29/20 1731   furosemide (LASIX) injection 20 mg, 20 mg, Intravenous, Q12H, Sharion Settler, NP, 20 mg at 10/30/20 0606   guaiFENesin (MUCINEX) 12 hr tablet 600 mg, 600 mg, Oral, BID, Nolberto Hanlon, MD, 600 mg at 10/29/20 2137   guaiFENesin (ROBITUSSIN) 100 MG/5ML liquid 10 mL, 10 mL, Oral, Q4H PRN, Nolberto Hanlon, MD, 10 mL at 10/28/20 1157   ipratropium-albuterol (DUONEB) 0.5-2.5 (3) MG/3ML nebulizer solution 3 mL, 3 mL, Nebulization, BID, Kurtis Bushman, Gwynneth Albright, MD, 3 mL at 10/29/20 2107   linezolid (ZYVOX) IVPB 600 mg, 600 mg, Intravenous, Q12H, Shawna Clamp, MD, Last Rate: 300 mL/hr at 10/29/20 2140, 600 mg at 10/29/20 2140   loratadine (CLARITIN) tablet 10 mg, 10 mg, Oral, Daily, [name] Browning, [name] T, DO, 10 mg at 10/29/20 0946   methylPREDNISolone sodium succinate (SOLU-MEDROL) 40 mg/mL injection 40 mg, 40 mg, Intravenous, Q24H, Keymari Sato, MD, 40 mg at 10/29/20 1424   metoprolol succinate (TOPROL-XL) 24 hr tablet 12.5 mg, 12.5 mg, Oral, BID, Amery, Sahar, MD, 12.5 mg at 10/29/20 2137   montelukast (SINGULAIR) tablet 10 mg, 10 mg, Oral, QHS, [name] Browning, [name] T, DO, 10 mg at 10/29/20 2137    pantoprazole (PROTONIX) EC tablet 40 mg, 40 mg, Oral, QPC supper, [name] Browning, [name] T, DO, 40 mg at 10/29/20 1731   phenazopyridine (PYRIDIUM) tablet 200 mg, 200 mg, Oral, TID PRN, [name] Browning, [name] T, DO   tamsulosin (FLOMAX) capsule 0.4 mg, 0.4 mg, Oral, Daily, [name] Browning, [name] T, DO, 0.4 mg at 10/29/20 0946    ALLERGIES   Hydromorphone     REVIEW OF SYSTEMS    Review of Systems:  Gen:  Denies  fever, sweats, chills weigh loss  HEENT: Denies blurred vision, double vision, ear pain, eye pain, hearing loss, nose bleeds, sore throat Cardiac:  No dizziness, chest pain or heaviness, chest tightness,edema Resp:   Denies cough or sputum porduction, shortness of breath,wheezing, hemoptysis,  Gi: Denies swallowing difficulty, stomach pain, nausea or vomiting, diarrhea, constipation, bowel incontinence Gu:  Denies bladder incontinence, burning urine Ext:   Denies Joint pain, stiffness or swelling Skin: Denies  skin rash, easy bruising or bleeding or hives Endoc:  Denies polyuria, polydipsia , polyphagia or weight change Psych:   Denies depression, insomnia or hallucinations   Other:  All other systems negative   VS: BP 138/79 (BP Location: Left Arm)   Pulse 62   Temp 97.8 F (36.6 C)   Resp 20   Ht 6' 2"  (1.88 m)   Wt 81.6 kg   SpO2 97%   BMI 23.11 kg/m      PHYSICAL EXAM    GENERAL:NAD, no fevers, chills, no weakness no fatigue HEAD: Normocephalic, atraumatic.  EYES: Pupils equal, round, reactive to light. Extraocular muscles intact. No scleral icterus.  MOUTH: Moist mucosal membrane. Dentition intact. No abscess noted.  EAR, NOSE, THROAT: Clear without exudates. No external lesions.  NECK: Supple. No thyromegaly. No nodules. No JVD.  PULMONARY: bilateral rhonchi CARDIOVASCULAR: S1 and S2. Irregular rate and rhythm. No murmurs, rubs, or gallops. No edema. Pedal pulses 2+ bilaterally.  GASTROINTESTINAL: Soft, nontender, nondistended. No masses. Positive bowel sounds. No hepatosplenomegaly.   MUSCULOSKELETAL: No swelling, clubbing, or edema. Range of motion full in all extremities.  NEUROLOGIC: Cranial nerves II through XII are intact. No gross focal neurological deficits. Sensation  intact. Reflexes intact.  SKIN: No ulceration, lesions, rashes, or cyanosis. Skin warm and dry. Turgor intact.  PSYCHIATRIC: Mood, affect within normal limits. The patient is awake, alert and oriented x 3. Insight, judgment intact.       IMAGING        DG Chest 2 View  Result Date: 10/06/2020 CLINICAL DATA:  Shortness of breath. EXAM: CHEST - 2 VIEW COMPARISON:  September 27, 2020. FINDINGS: The heart size and mediastinal contours are within normal limits. Stable reticular densities are noted throughout both lungs consistent with scarring or fibrosis, but acute superimposed inflammation can not be excluded. The visualized skeletal structures are unremarkable. IMPRESSION: Stable reticular densities are noted throughout both lungs consistent with scarring or fibrosis, but acute superimposed inflammation can not be excluded. Electronically Signed   By: Marijo Conception M.D.   On: 10/06/2020 16:54   DG Abdomen 1 View  Result Date: 10/20/2020 CLINICAL DATA:  Sepsis.  Evaluate for stent placement. EXAM: ABDOMEN - 1 VIEW COMPARISON:  Fluoroscopy 10/16/2020 FINDINGS: A left ureteral stent is present. The proximal pigtail projects to the left of L3. This is lower than on the previous fluoroscopic image. The distal pigtail projects over the perineum below the level of the symphysis pubis. This suggests inferior migration of the stent with the distal pigtail probably either in the urethra or possibly in a prolapsed bladder. Scattered gas and stool throughout the colon and small bowel. No small or large bowel distention. No radiopaque stones are identified. Degenerative changes in the spine and hips. IMPRESSION: Left ureteral stent is present. The stent appears to have migrated inferiorly with the proximal pigtail  lower than on prior study and the distal pigtail projecting below the level of the symphysis pubis, possibly in the urethra or in a prolapsed bladder. Electronically Signed   By: Lucienne Capers M.D.   On: 10/20/2020 20:18   DG Chest Port 1 View  Result Date: 10/29/2020 CLINICAL DATA:  Shortness of breath EXAM: PORTABLE CHEST 1 VIEW COMPARISON:  10/27/2020, 10/23/2020, 08/27/2017,, CT 09/19/2018 FINDINGS: Emphysematous disease. Mild reticular opacity at the bases likely due to fibrosis. Slight decreased interstitial opacity since 10/27/2020. No new confluent airspace disease, pleural effusion or pneumothorax. Apical pleural thickening. Stable cardiomediastinal silhouette with aortic atherosclerosis. IMPRESSION: 1. Underlying chronic lung disease/emphysema with overall decreased interstitial opacity since 10/27/2020 suggesting resolving superimposed edema or inflammatory process 2. No new confluent airspace disease Electronically Signed   By: Donavan Foil M.D.   On: 10/29/2020 15:39   DG Chest Port 1 View  Result Date: 10/27/2020 CLINICAL DATA:  Chronic hypoxemia, COPD presents with weakness and shortness of breath EXAM: PORTABLE CHEST 1 VIEW COMPARISON:  Chest radiograph 10/23/2020 FINDINGS: The cardiomediastinal silhouette is stable. There are diffusely coarsened interstitial markings throughout both lungs, overall not significantly changed since 10/23/2020. There is no new or worsening focal airspace disease. There is no pleural effusion. There is no pneumothorax. There is no acute osseous abnormality. IMPRESSION: Unchanged coarsened interstitial markings throughout both lungs since 10/23/2020 though these are increased since 10/06/2020. Findings may reflect edema or infection superimposed on chronic changes. Electronically Signed   By: Valetta Mole M.D.   On: 10/27/2020 09:05   DG Chest Port 1 View  Result Date: 10/23/2020 CLINICAL DATA:  Respiratory failure, increasing shortness of breath EXAM:  PORTABLE CHEST 1 VIEW COMPARISON:  10/20/2020 FINDINGS: Unchanged mildly enlarged cardiac contour. Redemonstrated diffuse airspace and interstitial opacities bilaterally, which appear largely unchanged compared to the prior exam.  No definite pleural effusion. Aortic calcifications. No acute osseous abnormality. IMPRESSION: Unchanged bilateral pulmonary opacities, which could represent edema or infection. Electronically Signed   By: Merilyn Baba M.D.   On: 10/23/2020 17:49   DG Chest Port 1 View  Result Date: 10/20/2020 CLINICAL DATA:  Questionable sepsis.  Evaluate for abnormality. EXAM: PORTABLE CHEST 1 VIEW COMPARISON:  10/06/2020 FINDINGS: Mild cardiac enlargement. Diffuse airspace and interstitial infiltration throughout both lungs, progressing since prior study. This may represent edema or multifocal pneumonia. No pleural effusions. No pneumothorax. Mediastinal contours appear intact. Calcification of the aorta. Degenerative changes in the spine and shoulders. IMPRESSION: Increasing bilateral pulmonary infiltrates, likely edema or pneumonia. Electronically Signed   By: Lucienne Capers M.D.   On: 10/20/2020 19:08   DG OR UROLOGY CYSTO IMAGE ([facility] ONLY)  Result Date: 10/16/2020 There is no interpretation for this exam.  This order is for images obtained during a surgical procedure.  Please See "Surgeries" Tab for more information regarding the procedure.   ECHOCARDIOGRAM COMPLETE  Result Date: 10/23/2020    ECHOCARDIOGRAM REPORT   Patient Name:   KASHAWN DIRR Date of Exam: 10/23/2020 Medical Rec #:  [id number]     Height:       74.0 in Accession #:    [id number]    Weight:       180.0 lb Date of Birth:  [birth year]     BSA:          2.078 m Patient Age:    54 years      BP:           133/80 mmHg Patient Gender: M             HR:           95 bpm. Exam Location:  [facility] Procedure: 2D Echo, Cardiac Doppler and Color Doppler Indications:     CHF-acute diastolic X73.53  History:         Patient has prior  history of Echocardiogram examinations, most                  recent 04/20/2015. Angina, COPD and TIA; Signs/Symptoms:Syncope.  Sonographer:     Sherrie Sport Referring Phys:  [id number] [place] Diagnosing Phys: Ida Rogue MD  Sonographer Comments: Suboptimal apical window. IMPRESSIONS  1. Left ventricular ejection fraction, by estimation, is 60 to 65%. The left ventricle has normal function. The left ventricle has no regional wall motion abnormalities. Left ventricular diastolic parameters are indeterminate.  2. Right ventricular systolic function is normal. The right ventricular size is normal. There is mildly elevated pulmonary artery systolic pressure.  3. Rhythm is atrial fibrillation  4. Left atrial size was moderately dilated. FINDINGS  Left Ventricle: Left ventricular ejection fraction, by estimation, is 60 to 65%. The left ventricle has normal function. The left ventricle has no regional wall motion abnormalities. The left ventricular internal cavity size was normal in size. There is  no left ventricular hypertrophy. Left ventricular diastolic parameters are indeterminate. Right Ventricle: The right ventricular size is normal. No increase in right ventricular wall thickness. Right ventricular systolic function is normal. There is mildly elevated pulmonary artery systolic pressure. The tricuspid regurgitant velocity is 2.95  m/s, and with an assumed right atrial pressure of 5 mmHg, the estimated right ventricular systolic pressure is 83.4 mmHg. Left Atrium: Left atrial size was moderately dilated. Right Atrium: Right atrial size was normal in size. Pericardium: There is no evidence of pericardial effusion. Mitral Valve:  The mitral valve is normal in structure. There is mild thickening of the mitral valve leaflet(s). No evidence of mitral valve regurgitation. No evidence of mitral valve stenosis. Tricuspid Valve: The tricuspid valve is normal in structure. Tricuspid valve regurgitation is mild . No evidence  of tricuspid stenosis. Aortic Valve: The aortic valve is normal in structure. Aortic valve regurgitation is not visualized. No aortic stenosis is present. Aortic valve mean gradient measures 2.0 mmHg. Aortic valve peak gradient measures 4.7 mmHg. Aortic valve area, by VTI measures 3.04 cm. Pulmonic Valve: The pulmonic valve was normal in structure. Pulmonic valve regurgitation is not visualized. No evidence of pulmonic stenosis. Aorta: The aortic root is normal in size and structure. Venous: The inferior vena cava is normal in size with greater than 50% respiratory variability, suggesting right atrial pressure of 3 mmHg. IAS/Shunts: No atrial level shunt detected by color flow Doppler.  LEFT VENTRICLE PLAX 2D LVIDd:         4.19 cm LVIDs:         2.93 cm LV PW:         1.33 cm LV IVS:        0.95 cm LVOT diam:     2.00 cm LV SV:         55 LV SV Index:   26 LVOT Area:     3.14 cm  RIGHT VENTRICLE RV Basal diam:  4.55 cm RV S prime:     7.51 cm/s TAPSE (M-mode): 3.8 cm LEFT ATRIUM              Index        RIGHT ATRIUM           Index LA diam:        3.50 cm  1.68 cm/m   RA Area:     26.60 cm LA Vol (A2C):   93.4 ml  44.94 ml/m  RA Volume:   96.30 ml  46.34 ml/m LA Vol (A4C):   128.0 ml 61.59 ml/m LA Biplane Vol: 113.0 ml 54.37 ml/m  AORTIC VALVE                    PULMONIC VALVE AV Area (Vmax):    2.83 cm     PV Vmax:        0.96 m/s AV Area (Vmean):   2.84 cm     PV Peak grad:   3.6 mmHg AV Area (VTI):     3.04 cm     RVOT Peak grad: 4 mmHg AV Vmax:           108.00 cm/s AV Vmean:          70.100 cm/s AV VTI:            0.181 m AV Peak Grad:      4.7 mmHg AV Mean Grad:      2.0 mmHg LVOT Vmax:         97.30 cm/s LVOT Vmean:        63.300 cm/s LVOT VTI:          0.175 m LVOT/AV VTI ratio: 0.97  AORTA Ao Root diam: 3.30 cm MITRAL VALVE                TRICUSPID VALVE MV Area (PHT): 4.52 cm     TR Peak grad:   34.8 mmHg MV Decel Time: 168 msec     TR Vmax:        295.00  cm/s MV E velocity: 134.00 cm/s                              SHUNTS                             Systemic VTI:  0.18 m                             Systemic Diam: 2.00 cm Ida Rogue MD Electronically signed by Ida Rogue MD Signature Date/Time: 10/23/2020/5:30:24 PM    Final    CT Renal Stone Study  Result Date: 10/20/2020 CLINICAL DATA:  Intermittent weakness, feeling as if he would faint. EXAM: CT ABDOMEN AND PELVIS WITHOUT CONTRAST TECHNIQUE: Multidetector CT imaging of the abdomen and pelvis was performed following the standard protocol without IV contrast. COMPARISON:  September 18, 2020 FINDINGS: Lower chest: Moderate severity chronic appearing fibrotic changes are seen involving the bilateral lung bases. Small bilateral pleural effusions are noted. Hepatobiliary: No focal liver abnormality is seen. No gallstones, gallbladder wall thickening, or biliary dilatation. Pancreas: Unremarkable. No pancreatic ductal dilatation or surrounding inflammatory changes. Spleen: A punctate calcified granuloma is seen within an otherwise normal-appearing spleen. Adrenals/Urinary Tract: Adrenal glands are unremarkable. Kidneys are normal in size, without focal lesions. A 2 mm nonobstructing renal stone is seen within the upper pole of the right kidney. Clusters of subcentimeter nonobstructing renal stones are seen within the mid and lower left kidney. A left-sided endo ureteral stent is in place. The proximal portion of the stent is seen within the proximal left ureter, just beyond the left UPJ. The distal end extends into the urinary bladder, continues through the prostate gland in serpentine fashion to the proximal to mid portion of the urethra (axial CT image 97, CT series 2). The urinary bladder is empty and subsequently limited in evaluation. Stomach/Bowel: Stomach is within normal limits. Appendix appears normal. No evidence of bowel dilatation. Numerous diverticula are seen within the descending and sigmoid colon. Vascular/Lymphatic: Aortic  atherosclerosis. No enlarged abdominal or pelvic lymph nodes. Reproductive: The prostate gland is moderately enlarged. A moderate amount of prostate gland calcification is seen. Other: A 4.4 cm x 2.7 cm fat containing right inguinal hernia is seen. No abdominopelvic ascites. Musculoskeletal: Multilevel degenerative changes seen throughout the lumbar spine. IMPRESSION: 1. Left-sided endo ureteral stent which may be malpositioned, as described above. Confirmation of the desired positioning is recommended. 2. Bilateral nonobstructing renal stones. 3. Small bilateral pleural effusions. 4. Colonic diverticulosis. Aortic Atherosclerosis (ICD10-I70.0). Electronically Signed   By: Virgina Norfolk M.D.   On: 10/20/2020 21:54      ASSESSMENT/PLAN   Acute on chronic hypoxemic respiratory failure - present on admission  - COVID19 negative  -MRSA nasal PCR -procalcitonin trend - supplemental O2 during my evaluation 3L/min - will perform infectious workup for pneumonia -Respiratory viral panel -serum fungitell -legionella ab -strep pneumoniae ur AG -Histoplasma Ur Ag -sputum resp cultures -AFB sputum expectorated specimen -sputum cytology  -reviewed pertinent imaging with patient today - ESR -PT/OT for d/c planning  -please encourage patient to use incentive spirometer few times each hour while hospitalized.   -agree with zyvox -adding lasix 20 bid IV -reducing solumedrol to 60 iv daily to prednisone 33m daily PO      Thank you for allowing me to participate in the care of this patient.  Total face to face encounter time for this patient visit was >70mn. >50% of the time was  spent in counseling and coordination of care.   Patient/Family are satisfied with care plan and all questions have been answered.  This document was prepared using Dragon voice recognition software and may include unintentional dictation errors.     FOttie Glazier M.D.  Division of [facility]

## 2020-10-30 NOTE — Progress Notes (Signed)
PHARMACIST - PHYSICIAN COMMUNICATION DR:   Reesa Chew  CONCERNING: Antibiotic IV to Oral Route Change Policy  RECOMMENDATION: This patient is receiving ZYVOX by the intravenous route.  Based on criteria approved by the Pharmacy and Therapeutics Committee, the antibiotic(s) is/are being converted to the equivalent oral dose form(s).   DESCRIPTION: These criteria include: Patient being treated for a respiratory tract infection, urinary tract infection, cellulitis or clostridium difficile associated diarrhea if on metronidazole The patient is not neutropenic and does not exhibit a GI malabsorption state The patient is eating (either orally or via tube) and/or has been taking other orally administered medications for a least 24 hours The patient is improving clinically and has a Tmax < 100.5  If you have questions about this conversion, please contact the Pharmacy Department  []   [phone] )  [facility] []   [phone] )  [facility]  []   [phone] )  [facility] []   [phone] )  [facility]  [x]   [phone] )  [facility]  **Discussed with provider before changing to PO**  Kanya Potteiger Rodriguez-Guzman PharmD, BCPS 10/30/2020 2:36 PM

## 2020-10-30 NOTE — Progress Notes (Signed)
PROGRESS NOTE    [name] Browning  IRS:[id number] DOB: 07/21/1940 DOA: 10/20/2020 PCP: [facility]   Brief Narrative: Taken from prior notes. This 80 y.o. male with medical history significant for COPD with chronic hypoxemia on 2 L, paroxysmal atrial fibrillation on Eliquis, hypertension, TIA and recent nephrolithiasis s/p ureteral stent who presented in the ED  with C/O: weakness and increasing shortness of breath. Patient recently hospitalized in September for abdominal pain and shortness of breath.  He was found to have COPD with pneumonia.  He also had obstructing left proximal ureteral stone with left-sided hydronephrosis, AKI and underwent left ureteral stent by urology on 9/11.  Subsequently following procedure he developed interstitial edema and hypotension requiring ICU/SDM BiPAP.   Patient was severely hypoxic requiring nonrebreather on arrival .  He was treated empirically for pneumonia and UTI.  CT renal study showed bilateral mild pleural effusion and possible malpositioning of ureteral stent.  Urology was consulted and the stent was removed.  He will follow-up with urology as an outpatient for further recommendations.   He was initially placed on Levophed and then weaned off.  10/21:Patient seems improving now, will need total of 2 weeks of antibiotics, Zyvox was transition to p.o. last day of treatment will be 11/03/2020.  IV Solu-Medrol was converted to prednisone. PT/OT ordered for discharge planning. Repeat UA was done due to nursing concern of pink urine, shows same amount of hematuria as compared to prior urinalysis.  Subjective: Patient was seen and examined today.  No new complaints.  Denies any shortness of breath.  Assessment & Plan:   Principal Problem:   Acute on chronic respiratory failure with hypoxia (HCC) Active Problems:   History of TIA (transient ischemic attack)   Hyperlipidemia   AF (paroxysmal atrial fibrillation) (HCC)   Urinary tract obstruction  by kidney stone   Community acquired pneumonia   Sepsis ([place])   Hypotension   Septic shock ([place])  Acute on chronic hypoxic respiratory failure secondary to CAP/acute diastolic heart failure. Improved.  Patient currently on his baseline oxygen requirement. Echocardiogram with normal EF.  Appears euvolemic. -Continue with Zyvox-convert to p.o. we will complete the course on 11/03/2020 -Continue with Lasix 20 mg twice daily. -Convert Solu-Medrol to prednisone per pulmonology -Continue with incentive spirometry and flutter valve -PT/OT evaluation  Septic shock secondary to CAP and complicated UTI.  Initially requiring pressors now weaned off. Blood cultures remain negative.  Urine cultures with Enterococcus faecalis, and staph. Haemol  Resistant to vancomycin. -Convert IV Zyvox to p.o. to complete the course on 11/03/2020.  Paroxysmal atrial fibrillation.  Rate well controlled. -Continue with Toprol 12.5 mg twice daily -Continue with Eliquis  Thrombocytopenia.  Resolved.  Most likely secondary to sepsis.  History of TIA. -Continue with aspirin and statin   Objective: Vitals:   10/29/20 2326 10/30/20 0334 10/30/20 0738 10/30/20 1155  BP: 120/72 122/70 138/79 119/66  Pulse: 88 71 62 69  Resp: 18 18 20 20   Temp: 97.9 F (36.6 C) 98.1 F (36.7 C) 97.8 F (36.6 C) 98.1 F (36.7 C)  TempSrc:      SpO2: 99% 95% 97% 99%  Weight:      Height:        Intake/Output Summary (Last 24 hours) at 10/30/2020 1533 Last data filed at 10/30/2020 1136 Gross per 24 hour  Intake 1778.48 ml  Output 2900 ml  Net -1121.52 ml   Filed Weights   10/20/20 1722  Weight: 81.6 kg    Examination:  General exam: Appears calm and comfortable  Respiratory system: Clear to auscultation. Respiratory effort normal. Cardiovascular system: S1 & S2 heard, RRR.  Gastrointestinal system: Soft, nontender, nondistended, bowel sounds positive. Central nervous system: Alert and oriented. No focal  neurological deficits. Extremities: No edema, no cyanosis, pulses intact and symmetrical. Psychiatry: Judgement and insight appear normal.    DVT prophylaxis: Eliquis Code Status: Full Family Communication:  Disposition Plan:  Status is: Inpatient  Remains inpatient appropriate because: Because of the severity of illness.  Level of care: Med-Surg  All the records are reviewed and case discussed with Care Management/Social Worker. Management plans discussed with the patient, nursing and they are in agreement.  Consultants:  PCCM  Procedures:  Antimicrobials:  Zyvox  Data Reviewed: I  personally reviewed following labs and imaging studies  CBC: Recent Labs  Lab 10/26/20 0831 10/29/20 0451  WBC 11.1* 16.5*  HGB 10.3* 11.5*  HCT 31.6* 35.3*  MCV 94.6 95.4  PLT 186 756   Basic Metabolic Panel: Recent Labs  Lab 10/25/20 0543 10/26/20 0436 10/27/20 0044 10/27/20 2355 10/29/20 0451  NA 137 135  --  136 135  K 4.1 3.8  --  4.2 3.7  CL 106 103  --  101 98  CO2 27 27  --  26 30  GLUCOSE 176* 139*  --  201* 141*  BUN 40* 43*  --  42* 45*  CREATININE 0.93 1.24 1.05 1.17 1.11  CALCIUM 8.2* 8.5*  --  8.7* 8.7*  MG  --   --  2.1 2.0 2.0  PHOS  --   --   --   --  3.4   GFR: Estimated Creatinine Clearance: 62.3 mL/min (by C-G formula based on SCr of 1.11 mg/dL). Liver Function Tests: No results for input(s): AST, ALT, ALKPHOS, BILITOT, PROT, ALBUMIN in the last 168 hours. No results for input(s): LIPASE, AMYLASE in the last 168 hours. No results for input(s): AMMONIA in the last 168 hours. Coagulation Profile: No results for input(s): INR, PROTIME in the last 168 hours. Cardiac Enzymes: No results for input(s): CKTOTAL, CKMB, CKMBINDEX, TROPONINI in the last 168 hours. BNP (last 3 results) No results for input(s): PROBNP in the last 8760 hours. HbA1C: No results for input(s): HGBA1C in the last 72 hours. CBG: No results for input(s): GLUCAP in the last 168  hours. Lipid Profile: No results for input(s): CHOL, HDL, LDLCALC, TRIG, CHOLHDL, LDLDIRECT in the last 72 hours. Thyroid Function Tests: No results for input(s): TSH, T4TOTAL, FREET4, T3FREE, THYROIDAB in the last 72 hours. Anemia Panel: No results for input(s): VITAMINB12, FOLATE, FERRITIN, TIBC, IRON, RETICCTPCT in the last 72 hours. Sepsis Labs: Recent Labs  Lab 10/28/20 1102  PROCALCITON <0.10    Recent Results (from the past 240 hour(s))  Resp Panel by RT-PCR (Flu A&B, Covid) Nasopharyngeal Swab     Status: None   Collection Time: 10/20/20  5:33 PM   Specimen: Nasopharyngeal Swab; Nasopharyngeal(NP) swabs in vial transport medium  Result Value Ref Range Status   SARS Coronavirus 2 by RT PCR NEGATIVE NEGATIVE Final    Comment: (NOTE) SARS-CoV-2 target nucleic acids are NOT DETECTED.  The SARS-CoV-2 RNA is generally detectable in upper respiratory specimens during the acute phase of infection. The lowest concentration of SARS-CoV-2 viral copies this assay can detect is 138 copies/mL. A negative result does not preclude SARS-Cov-2 infection and should not be used as the sole basis for treatment or other patient management decisions. A negative result may occur  with  improper specimen collection/handling, submission of specimen other than nasopharyngeal swab, presence of viral mutation(s) within the areas targeted by this assay, and inadequate number of viral copies(<138 copies/mL). A negative result must be combined with clinical observations, patient history, and epidemiological information. The expected result is Negative.  Fact Sheet for Patients:  EntrepreneurPulse.com.au  Fact Sheet for Healthcare Providers:  IncredibleEmployment.be  This test is no t yet approved or cleared by the [place] FDA and  has been authorized for detection and/or diagnosis of SARS-CoV-2 by FDA under an Emergency Use Authorization (EUA). This EUA  will remain  in effect (meaning this test can be used) for the duration of the COVID-19 declaration under Section 564(b)(1) of the Act, 21 U.S.C.section 360bbb-3(b)(1), unless the authorization is terminated  or revoked sooner.       Influenza A by PCR NEGATIVE NEGATIVE Final   Influenza B by PCR NEGATIVE NEGATIVE Final    Comment: (NOTE) The Xpert Xpress SARS-CoV-2/FLU/RSV plus assay is intended as an aid in the diagnosis of influenza from Nasopharyngeal swab specimens and should not be used as a sole basis for treatment. Nasal washings and aspirates are unacceptable for Xpert Xpress SARS-CoV-2/FLU/RSV testing.  Fact Sheet for Patients: EntrepreneurPulse.com.au  Fact Sheet for Healthcare Providers: IncredibleEmployment.be  This test is not yet approved or cleared by the [place] FDA and has been authorized for detection and/or diagnosis of SARS-CoV-2 by FDA under an Emergency Use Authorization (EUA). This EUA will remain in effect (meaning this test can be used) for the duration of the COVID-19 declaration under Section 564(b)(1) of the Act, 21 U.S.C. section 360bbb-3(b)(1), unless the authorization is terminated or revoked.  Performed at [facility], [address] [place], [place] [address]   Blood Culture (routine x 2)     Status: None   Collection Time: 10/20/20  5:58 PM   Specimen: BLOOD  Result Value Ref Range Status   Specimen Description BLOOD BLOOD LEFT FOREARM  Final   Special Requests   Final    BOTTLES DRAWN AEROBIC AND ANAEROBIC Blood Culture results may not be optimal due to an excessive volume of blood received in culture bottles   Culture   Final    NO GROWTH 5 DAYS Performed at [facility], [address], [place], [place] [address]    Report Status 10/25/2020 FINAL  Final  Blood Culture (routine x 2)     Status: None   Collection Time: 10/20/20  5:58 PM   Specimen: BLOOD  Result Value  Ref Range Status   Specimen Description BLOOD RIGHT ANTECUBITAL  Final   Special Requests   Final    BOTTLES DRAWN AEROBIC AND ANAEROBIC Blood Culture results may not be optimal due to an excessive volume of blood received in culture bottles   Culture   Final    NO GROWTH 5 DAYS Performed at [facility], [address], [place], [place] [address]    Report Status 10/25/2020 FINAL  Final  Urine Culture     Status: Abnormal   Collection Time: 10/20/20  8:20 PM   Specimen: Urine, Clean Catch  Result Value Ref Range Status   Specimen Description   Final    URINE, CLEAN CATCH Performed at [facility], [address], [place], [place] [address]    Special Requests   Final    NONE Performed at [facility], [address], [place], [place] [address]    Culture (A)  Final    >=  100,000 COLONIES/mL STAPHYLOCOCCUS HAEMOLYTICUS >=100,000 COLONIES/mL VANCOMYCIN RESISTANT ENTEROCOCCUS    Report Status 10/24/2020 FINAL  Final   Organism ID, Bacteria STAPHYLOCOCCUS HAEMOLYTICUS (A)  Final   Organism ID, Bacteria VANCOMYCIN RESISTANT ENTEROCOCCUS (A)  Final      Susceptibility   Staphylococcus haemolyticus - MIC*    CIPROFLOXACIN >=8 RESISTANT Resistant     GENTAMICIN >=16 RESISTANT Resistant     NITROFURANTOIN <=16 SENSITIVE Sensitive     OXACILLIN >=4 RESISTANT Resistant     TETRACYCLINE 2 SENSITIVE Sensitive     VANCOMYCIN 1 SENSITIVE Sensitive     TRIMETH/SULFA >=320 RESISTANT Resistant     CLINDAMYCIN >=8 RESISTANT Resistant     RIFAMPIN >=32 RESISTANT Resistant     Inducible Clindamycin NEGATIVE Sensitive     * >=100,000 COLONIES/mL STAPHYLOCOCCUS HAEMOLYTICUS   Vancomycin resistant enterococcus - MIC*    AMPICILLIN >=32 RESISTANT Resistant     NITROFURANTOIN 64 INTERMEDIATE Intermediate     VANCOMYCIN >=32 RESISTANT Resistant     LINEZOLID 2 SENSITIVE Sensitive     * >=100,000 COLONIES/mL VANCOMYCIN RESISTANT ENTEROCOCCUS  Expectorated Sputum  Assessment w Gram Stain, Rflx to Resp Cult     Status: None   Collection Time: 10/22/20  9:10 AM   Specimen: Expectorated Sputum  Result Value Ref Range Status   Specimen Description EXPECTORATED SPUTUM  Final   Special Requests NONE  Final   Sputum evaluation   Final    THIS SPECIMEN IS ACCEPTABLE FOR SPUTUM CULTURE Performed at [facility], [address] [place], [place] [address]    Report Status 10/22/2020 FINAL  Final  Culture, Respiratory w Gram Stain     Status: None   Collection Time: 10/22/20  9:10 AM  Result Value Ref Range Status   Specimen Description   Final    EXPECTORATED SPUTUM Performed at [facility], [address] [place], [place] [address]    Special Requests   Final    NONE Reflexed from [id number] Performed at [facility], [address] [place], [place] [address]    Gram Stain   Final    MODERATE WBC PRESENT,BOTH PMN AND MONONUCLEAR RARE GRAM NEGATIVE RODS RARE GRAM POSITIVE COCCI    Culture   Final    FEW Normal respiratory flora-no Staph aureus or Pseudomonas seen Performed at [facility], [place] [address] [place], [place] [address]    Report Status 10/24/2020 FINAL  Final  MRSA Next Gen by PCR, Nasal     Status: None   Collection Time: 10/28/20 12:30 PM   Specimen: Nasal Mucosa; Nasal Swab  Result Value Ref Range Status   MRSA by PCR Next Gen NOT DETECTED NOT DETECTED Final    Comment: (NOTE) The GeneXpert MRSA Assay (FDA approved for NASAL specimens only), is one component of a comprehensive MRSA colonization surveillance program. It is not intended to diagnose MRSA infection nor to guide or monitor treatment for MRSA infections. Test performance is not FDA approved in patients less than 43 years old. Performed at [facility], [address] [place], [place] [address]      Radiology Studies: DG Chest [place] 1 View  Result Date: 10/29/2020 CLINICAL DATA:  Shortness of breath EXAM: PORTABLE  CHEST 1 VIEW COMPARISON:  10/27/2020, 10/23/2020, 08/27/2017,, CT 09/19/2018 FINDINGS: Emphysematous disease. Mild reticular opacity at the bases likely due to fibrosis. Slight decreased interstitial opacity since 10/27/2020. No new confluent airspace disease, pleural effusion or pneumothorax. Apical pleural thickening. Stable cardiomediastinal  silhouette with aortic atherosclerosis. IMPRESSION: 1. Underlying chronic lung disease/emphysema with overall decreased interstitial opacity since 10/27/2020 suggesting resolving superimposed edema or inflammatory process 2. No new confluent airspace disease Electronically Signed   By: Donavan Foil M.D.   On: 10/29/2020 15:39    Scheduled Meds:  apixaban  5 mg Oral BID   aspirin EC  81 mg Oral QPC supper   atorvastatin  40 mg Oral QPC supper   famotidine  20 mg Oral Daily   ferrous gluconate  324 mg Oral QPC supper   furosemide  20 mg Intravenous Q12H   guaiFENesin  600 mg Oral BID   ipratropium-albuterol  3 mL Nebulization BID   linezolid  600 mg Oral Q12H   loratadine  10 mg Oral Daily   metoprolol succinate  12.5 mg Oral BID   montelukast  10 mg Oral QHS   pantoprazole  40 mg Oral QPC supper   [START ON 10/31/2020] predniSONE  50 mg Oral Q breakfast   tamsulosin  0.4 mg Oral Daily   Continuous Infusions:  sodium chloride Stopped (10/30/20 1048)     LOS: 10 days   Time spent: 40 minutes. More than 50% of the time was spent in counseling/coordination of care  Lorella Nimrod, MD [facility]  If 7PM-7AM, please contact night-coverage [email or web address]  10/30/2020, 3:33 PM   This record has been created using Systems analyst. Errors have been sought and corrected,but may not always be located. Such creation errors do not reflect on the standard of care.

## 2020-10-31 DIAGNOSIS — J9621 Acute and chronic respiratory failure with hypoxia: Secondary | ICD-10-CM | POA: Diagnosis not present

## 2020-10-31 LAB — BASIC METABOLIC PANEL
Anion gap: 9 (ref 5–15)
BUN: 46 mg/dL — ABNORMAL HIGH (ref 8–23)
CO2: 35 mmol/L — ABNORMAL HIGH (ref 22–32)
Calcium: 8.6 mg/dL — ABNORMAL LOW (ref 8.9–10.3)
Chloride: 94 mmol/L — ABNORMAL LOW (ref 98–111)
Creatinine, Ser: 1.04 mg/dL (ref 0.61–1.24)
GFR, Estimated: >60 mL/min (ref >60)
Glucose, Bld: 88 mg/dL (ref 70–99)
Potassium: 3.6 mmol/L (ref 3.5–5.1)
Sodium: 138 mmol/L (ref 135–145)

## 2020-10-31 LAB — CBC
HCT: 38.5 % — ABNORMAL LOW (ref 39.0–52.0)
Hemoglobin: 12.7 g/dL — ABNORMAL LOW (ref 13.0–17.0)
MCH: 31.5 pg (ref 26.0–34.0)
MCHC: 33 g/dL (ref 30.0–36.0)
MCV: 95.5 fL (ref 80.0–100.0)
Platelets: 271 10*3/uL (ref 150–400)
RBC: 4.03 MIL/uL — ABNORMAL LOW (ref 4.22–5.81)
RDW: 14.7 % (ref 11.5–15.5)
WBC: 16.6 10*3/uL — ABNORMAL HIGH (ref 4.0–10.5)
nRBC: 0 % (ref 0.0–0.2)

## 2020-10-31 LAB — FUNGITELL, SERUM: Fungitell Result: 45 pg/mL (ref <80)

## 2020-10-31 MED ORDER — LINEZOLID 600 MG PO TABS: 600.0000 mg | ORAL_TABLET | Freq: Two times a day (BID) | ORAL | 0 refills | Status: DC

## 2020-10-31 MED ORDER — METOPROLOL SUCCINATE ER 25 MG PO TB24: 12.5000 mg | ORAL_TABLET | Freq: Two times a day (BID) | ORAL | 1 refills | Status: DC

## 2020-10-31 MED ORDER — PREDNISONE 10 MG PO TABS: 40.0000 mg | ORAL_TABLET | Freq: Every day | ORAL | 1 refills | Status: DC

## 2020-10-31 NOTE — Progress Notes (Signed)
Physical Therapy Treatment Patient Details Name: [name] Browning MRN: [id number] DOB: 05-Sep-1940 Today's Date: 10/31/2020   History of Present Illness 80 y.o. male presenting to ED with concerns of weakness and increasing shortness of breath. Medical history significant for COPD with chronic hypoxemia on 2 L, paroxysmal atrial fibrillation on Eliquis, hypertension, TIA and recent nephrolithiasis s/p ureteral stent.    PT Comments    Pt was supine in bed upon arriving on 2 L o2. He agrees to session and is cooperative and motivated throughout.Easily able to exit bed, stand, and ambulate with use of RW. Ambulated 200 ft without LOB. Did have one episode of desaturation to 86% on 2 L but quickly recovers with breathing techniques. Pt does have a pulse oximeter at home. Recommend DC home with HHPT to follow.   Recommendations for follow up therapy are one component of a multi-disciplinary discharge planning process, led by the attending physician.  Recommendations may be updated based on patient status, additional functional criteria and insurance authorization.  Follow Up Recommendations  Home health PT;Supervision - Intermittent     Equipment Recommendations  Rolling walker with 5" wheels;Other (comment) (pt reports his at home is broken)       Precautions / Restrictions Precautions Precautions: Fall Restrictions Weight Bearing Restrictions: No     Mobility  Bed Mobility Overal bed mobility: Modified Independent     Transfers Overall transfer level: Modified independent Equipment used: Rolling walker (2 wheeled) Transfers: Sit to/from Stand Sit to Stand: Supervision    Ambulation/Gait Ambulation/Gait assistance: Supervision Gait Distance (Feet): 200 Feet Assistive device: Rolling walker (2 wheeled) Gait Pattern/deviations: Step-through pattern;Narrow base of support Gait velocity: decreased   General Gait Details: Pt wa sable to ambulate 200 ft without LOB or safety  concern    Balance Overall balance assessment: Needs assistance Sitting-balance support: No upper extremity supported;Feet supported Sitting balance-Leahy Scale: Good     Standing balance support: During functional activity;Bilateral upper extremity supported Standing balance-Leahy Scale: Good Standing balance comment: highly recommended pt use RW at home at all times up on feet       Cognition Arousal/Alertness: Awake/alert Behavior During Therapy: WFL for tasks assessed/performed Overall Cognitive Status: Within Functional Limits for tasks assessed      General Comments: Pt si alert and able to follwo simple commands throughout. 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 Progress towards PT goals: Progressing toward goals    Frequency    Min 2X/week      PT Plan Current plan remains appropriate       AM-PAC PT "6 Clicks" Mobility   Outcome Measure  Help needed turning from your back to your side while in a flat bed without using bedrails?: None Help needed moving from lying on your back to sitting on the side of a flat bed without using bedrails?: None Help needed moving to and from a bed to a chair (including a wheelchair)?: None Help needed standing up from a chair using your arms (e.g., wheelchair or bedside chair)?: None Help needed to walk in hospital room?: A Little Help needed climbing 3-5 steps with a railing? : A Little 6 Click Score: 22  End of Session Equipment Utilized During Treatment: Oxygen (2 L o2) Activity Tolerance: Patient tolerated treatment well;Patient limited by fatigue Patient left: in bed;with call bell/phone within reach;with bed alarm set Nurse Communication: Mobility status PT Visit  Diagnosis: Unsteadiness on feet (R26.81);Other abnormalities of gait and mobility (R26.89);Muscle weakness (generalized) (M62.81);Difficulty in walking, not elsewhere classified (R26.2)     Time: 1135-1150 PT Time Calculation (min) (ACUTE ONLY): 15 min  Charges:  $Gait Training: 8-22 mins                    Julaine Fusi PTA 10/31/20, 12:05 PM

## 2020-10-31 NOTE — Discharge Summary (Signed)
Physician Discharge Summary  [name] Browning XIP:[id number] DOB: 11-18-1940 DOA: 10/20/2020  PCP: [facility]  Admit date: 10/20/2020 Discharge date: 10/31/2020  Admitted From: Home Disposition: Home  Recommendations for Outpatient Follow-up:  Follow up with PCP in 1-2 weeks Follow-up with urology Please obtain BMP/CBC in one week Please follow up on the following pending results: None  Home Health: Yes Equipment/Devices: Home oxygen, rolling walker Discharge Condition: Stable CODE STATUS: Full Diet recommendation: Heart Healthy    Brief/Interim Summary: This 80 y.o. male with medical history significant for COPD with chronic hypoxemia on 2 L, paroxysmal atrial fibrillation on Eliquis, hypertension, TIA and recent nephrolithiasis s/p ureteral stent who presented in the ED  with C/O: weakness and increasing shortness of breath. Patient recently hospitalized in September for abdominal pain and shortness of breath.  He was found to have COPD with pneumonia.  He also had obstructing left proximal ureteral stone with left-sided hydronephrosis, AKI and underwent left ureteral stent by urology on 9/11.  Subsequently following procedure he developed interstitial edema and hypotension requiring ICU/SDM BiPAP.   Patient was severely hypoxic requiring nonrebreather on arrival .  He was treated empirically for pneumonia and UTI.  CT renal study showed bilateral mild pleural effusion and possible malpositioning of ureteral stent.  Urology was consulted and the stent was removed.  He will follow-up with urology as an outpatient for further recommendations.    He was initially placed on Levophed and then weaned off.  Blood cultures remain negative and urine cultures growing Enterococcus faecalis and staph Haemol. Which was resistant to vancomycin.  He was treated with IV Zyvox and later switched to p.o.  Will complete the course on 11/03/2020.  Patient currently on his baseline oxygen  requirement of 2 to 3 L.  Echocardiogram with normal EF and he appears euvolemic.  He initially received high doses of Solu-Medrol which were later converted to prednisone by pulmonology.  He was given a tapering dose of prednisone.  Continue to have some leukocytosis but no other sign of infection, most likely secondary to steroid use.  His PCP can repeat the levels once he finished with steroid taper.  Blood pressure remained within normal limit without any antihypertensives except low-dose Toprol. He can resume home dose of amlodipine and was advised to continue holding losartan, PCP can restart if needed.  Will continue the rest of his home medications and follow-up with his providers.  Discharge Diagnoses:  Principal Problem:   Acute on chronic respiratory failure with hypoxia (HCC) Active Problems:   History of TIA (transient ischemic attack)   Hyperlipidemia   AF (paroxysmal atrial fibrillation) (HCC)   Urinary tract obstruction by kidney stone   Community acquired pneumonia   Sepsis ([place])   Hypotension   Septic shock [facility])   Discharge Instructions  Discharge Instructions     Call MD for:  difficulty breathing, headache or visual disturbances   Complete by: As directed    Call MD for:  temperature >100.4   Complete by: As directed    Diet - low sodium heart healthy   Complete by: As directed    Diet - low sodium heart healthy   Complete by: As directed    Discharge instructions   Complete by: As directed    Use 2-3 Liters of 02 at home . Use the incentive spirometer and flutter valve daily 10x/hr. Follow up with your pcp in one week F/u with a lung doctor  F/u with cardiology in one week  F/u with urology in 6 months   Discharge instructions   Complete by: As directed    It was pleasure taking care of you. You are being given antibiotics for 8 more doses, please take it as directed. We also stopped your losartan and started you on a low-dose Lopressor.  Please  follow-up with your primary care provider and they can readjust your blood pressure medications as needed. Please follow-up with your urologist closely for further recommendations. Keep yourself well-hydrated   Increase activity slowly   Complete by: As directed    Increase activity slowly   Complete by: As directed       Allergies as of 10/31/2020       Reactions   Hydromorphone    Hallucination Pt reports he doesn't know about this        Medication List     STOP taking these medications    losartan 100 MG tablet Commonly known as: COZAAR   nitrofurantoin 50 MG capsule Commonly known as: MACRODANTIN       TAKE these medications    acetaminophen 500 MG tablet Commonly known as: TYLENOL Take 2 tablets (1,000 mg total) by mouth every 6 (six) hours as needed for mild pain.   amLODipine 5 MG tablet Commonly known as: NORVASC Take 1 tablet (5 mg total) by mouth daily after supper.   apixaban 5 MG Tabs tablet Commonly known as: ELIQUIS Take 1 tablet (5 mg total) by mouth 2 (two) times daily.   aspirin EC 81 MG tablet Take 81 mg by mouth daily after supper.   atorvastatin 40 MG tablet Commonly known as: LIPITOR Take 40 mg by mouth daily after supper.   cetirizine 10 MG tablet Commonly known as: ZYRTEC Take 10 mg by mouth daily.   famotidine 20 MG tablet Commonly known as: Pepcid One after supper   furosemide 20 MG tablet Commonly known as: Lasix Take 1 tablet (20 mg total) by mouth daily after breakfast.   guaiFENesin-dextromethorphan 100-10 MG/5ML syrup Commonly known as: ROBITUSSIN DM Take 5 mLs by mouth every 4 (four) hours as needed for cough.   Iron 240 (27 Fe) MG Tabs Take 27 mg by mouth daily after supper.   isosorbide mononitrate 30 MG 24 hr tablet Commonly known as: IMDUR Take 30 mg by mouth daily.   linezolid 600 MG tablet Commonly known as: ZYVOX Take 1 tablet (600 mg total) by mouth every 12 (twelve) hours for 4 days.    metoprolol succinate 25 MG 24 hr tablet Commonly known as: TOPROL-XL Take 0.5 tablets (12.5 mg total) by mouth 2 (two) times daily.   montelukast 10 MG tablet Commonly known as: SINGULAIR Take 10 mg by mouth at bedtime.   pantoprazole 40 MG tablet Commonly known as: PROTONIX Take 40 mg by mouth daily after supper.   phenazopyridine 200 MG tablet Commonly known as: PYRIDIUM Take 1 tablet (200 mg total) by mouth 3 (three) times daily as needed (bladder pain).   polyethylene glycol 17 g packet Commonly known as: MIRALAX / GLYCOLAX Take 17 g by mouth daily as needed for mild constipation or moderate constipation. What changed: when to take this   predniSONE 10 MG tablet Commonly known as: DELTASONE Take 4 tablets (40 mg total) by mouth daily with breakfast. For 3 days, then decrease to 3 tablets for another 2 days, decrease to 2 tablets for 2 more days, then continue taking 1 pill tell you will finish your medication. Start taking on: November 01, 2020 What changed:  how much to take how to take this when to take this additional instructions   Symbicort 160-4.5 MCG/ACT inhaler Generic drug: budesonide-formoterol Inhale 2 puffs into the lungs 2 (two) times daily as needed (shortness of breath).   tamsulosin 0.4 MG Caps capsule Commonly known as: FLOMAX Take 1 capsule (0.4 mg total) by mouth daily.               Durable Medical Equipment  (From admission, onward)           Start     Ordered   10/31/20 1204  For home use only DME Walker rolling  Once       Question Answer Comment  Walker: With [place] Wheels   Patient needs a walker to treat with the following condition Generalized weakness      10/31/20 1203   10/26/20 1214  For home use only DME Bedside commode  Once       Question:  Patient needs a bedside commode to treat with the following condition  Answer:  Weakness   10/26/20 1213   10/26/20 1214  For home use only DME Bedside commode  Once        Question:  Patient needs a bedside commode to treat with the following condition  Answer:  Weakness   10/26/20 1213            Follow-up Information     Billey Co, MD Follow up in 6 month(s).   Specialty: Urology Why: needs KUB Contact information: [place] [address] [place] Follow up in 1 week(s).   Contact information: [place] [address] [phone]         [facility] Schedule an appointment as soon as possible for a visit in 1 week(s).   Contact information: [place] [address] [phone]                Allergies  Allergen Reactions   Hydromorphone     Hallucination Pt reports he doesn't know about this    Consultations: PCCM  Procedures/Studies: DG Chest 2 View  Result Date: 10/06/2020 CLINICAL DATA:  Shortness of breath. EXAM: CHEST - 2 VIEW COMPARISON:  September 27, 2020. FINDINGS: The heart size and mediastinal contours are within normal limits. Stable reticular densities are noted throughout both lungs consistent with scarring or fibrosis, but acute superimposed inflammation can not be excluded. The visualized skeletal structures are unremarkable. IMPRESSION: Stable reticular densities are noted throughout both lungs consistent with scarring or fibrosis, but acute superimposed inflammation can not be excluded. Electronically Signed   By: Marijo Conception M.D.   On: 10/06/2020 16:54   DG Abdomen 1 View  Result Date: 10/20/2020 CLINICAL DATA:  Sepsis.  Evaluate for stent placement. EXAM: ABDOMEN - 1 VIEW COMPARISON:  Fluoroscopy 10/16/2020 FINDINGS: A left ureteral stent is present. The proximal pigtail projects to the left of L3. This is lower than on the previous fluoroscopic image. The distal pigtail projects over the perineum below the level of the symphysis pubis. This suggests inferior migration of the stent with the distal pigtail probably  either in the urethra or possibly in a prolapsed bladder. Scattered gas and stool throughout the colon and small bowel. No small or large bowel distention. No radiopaque stones are identified. Degenerative changes in the spine and hips. IMPRESSION: Left ureteral  stent is present. The stent appears to have migrated inferiorly with the proximal pigtail lower than on prior study and the distal pigtail projecting below the level of the symphysis pubis, possibly in the urethra or in a prolapsed bladder. Electronically Signed   By: Lucienne Capers M.D.   On: 10/20/2020 20:18   DG Chest Port 1 View  Result Date: 10/29/2020 CLINICAL DATA:  Shortness of breath EXAM: PORTABLE CHEST 1 VIEW COMPARISON:  10/27/2020, 10/23/2020, 08/27/2017,, CT 09/19/2018 FINDINGS: Emphysematous disease. Mild reticular opacity at the bases likely due to fibrosis. Slight decreased interstitial opacity since 10/27/2020. No new confluent airspace disease, pleural effusion or pneumothorax. Apical pleural thickening. Stable cardiomediastinal silhouette with aortic atherosclerosis. IMPRESSION: 1. Underlying chronic lung disease/emphysema with overall decreased interstitial opacity since 10/27/2020 suggesting resolving superimposed edema or inflammatory process 2. No new confluent airspace disease Electronically Signed   By: Donavan Foil M.D.   On: 10/29/2020 15:39   DG Chest Port 1 View  Result Date: 10/27/2020 CLINICAL DATA:  Chronic hypoxemia, COPD presents with weakness and shortness of breath EXAM: PORTABLE CHEST 1 VIEW COMPARISON:  Chest radiograph 10/23/2020 FINDINGS: The cardiomediastinal silhouette is stable. There are diffusely coarsened interstitial markings throughout both lungs, overall not significantly changed since 10/23/2020. There is no new or worsening focal airspace disease. There is no pleural effusion. There is no pneumothorax. There is no acute osseous abnormality. IMPRESSION: Unchanged coarsened interstitial markings  throughout both lungs since 10/23/2020 though these are increased since 10/06/2020. Findings may reflect edema or infection superimposed on chronic changes. Electronically Signed   By: Valetta Mole M.D.   On: 10/27/2020 09:05   DG Chest Port 1 View  Result Date: 10/23/2020 CLINICAL DATA:  Respiratory failure, increasing shortness of breath EXAM: PORTABLE CHEST 1 VIEW COMPARISON:  10/20/2020 FINDINGS: Unchanged mildly enlarged cardiac contour. Redemonstrated diffuse airspace and interstitial opacities bilaterally, which appear largely unchanged compared to the prior exam. No definite pleural effusion. Aortic calcifications. No acute osseous abnormality. IMPRESSION: Unchanged bilateral pulmonary opacities, which could represent edema or infection. Electronically Signed   By: Merilyn Baba M.D.   On: 10/23/2020 17:49   DG Chest Port 1 View  Result Date: 10/20/2020 CLINICAL DATA:  Questionable sepsis.  Evaluate for abnormality. EXAM: PORTABLE CHEST 1 VIEW COMPARISON:  10/06/2020 FINDINGS: Mild cardiac enlargement. Diffuse airspace and interstitial infiltration throughout both lungs, progressing since prior study. This may represent edema or multifocal pneumonia. No pleural effusions. No pneumothorax. Mediastinal contours appear intact. Calcification of the aorta. Degenerative changes in the spine and shoulders. IMPRESSION: Increasing bilateral pulmonary infiltrates, likely edema or pneumonia. Electronically Signed   By: Lucienne Capers M.D.   On: 10/20/2020 19:08   DG OR UROLOGY CYSTO IMAGE ([facility] ONLY)  Result Date: 10/16/2020 There is no interpretation for this exam.  This order is for images obtained during a surgical procedure.  Please See "Surgeries" Tab for more information regarding the procedure.   ECHOCARDIOGRAM COMPLETE  Result Date: 10/23/2020    ECHOCARDIOGRAM REPORT   Patient Name:   WILBORN MEMBRENO Date of Exam: 10/23/2020 Medical Rec #:  [id number]     Height:       74.0 in Accession #:     [id number]    Weight:       180.0 lb Date of Birth:  [birth year]     BSA:          2.078 m Patient Age:    81 years      BP:  133/80 mmHg Patient Gender: M             HR:           95 bpm. Exam Location:  [facility] Procedure: 2D Echo, Cardiac Doppler and Color Doppler Indications:     CHF-acute diastolic H37.16  History:         Patient has prior history of Echocardiogram examinations, most                  recent 04/20/2015. Angina, COPD and TIA; Signs/Symptoms:Syncope.  Sonographer:     Sherrie Sport Referring Phys:  [id number] [place] Diagnosing Phys: Ida Rogue MD  Sonographer Comments: Suboptimal apical window. IMPRESSIONS  1. Left ventricular ejection fraction, by estimation, is 60 to 65%. The left ventricle has normal function. The left ventricle has no regional wall motion abnormalities. Left ventricular diastolic parameters are indeterminate.  2. Right ventricular systolic function is normal. The right ventricular size is normal. There is mildly elevated pulmonary artery systolic pressure.  3. Rhythm is atrial fibrillation  4. Left atrial size was moderately dilated. FINDINGS  Left Ventricle: Left ventricular ejection fraction, by estimation, is 60 to 65%. The left ventricle has normal function. The left ventricle has no regional wall motion abnormalities. The left ventricular internal cavity size was normal in size. There is  no left ventricular hypertrophy. Left ventricular diastolic parameters are indeterminate. Right Ventricle: The right ventricular size is normal. No increase in right ventricular wall thickness. Right ventricular systolic function is normal. There is mildly elevated pulmonary artery systolic pressure. The tricuspid regurgitant velocity is 2.95  m/s, and with an assumed right atrial pressure of 5 mmHg, the estimated right ventricular systolic pressure is 10.1 mmHg. Left Atrium: Left atrial size was moderately dilated. Right Atrium: Right atrial size was normal in size. Pericardium:  There is no evidence of pericardial effusion. Mitral Valve: The mitral valve is normal in structure. There is mild thickening of the mitral valve leaflet(s). No evidence of mitral valve regurgitation. No evidence of mitral valve stenosis. Tricuspid Valve: The tricuspid valve is normal in structure. Tricuspid valve regurgitation is mild . No evidence of tricuspid stenosis. Aortic Valve: The aortic valve is normal in structure. Aortic valve regurgitation is not visualized. No aortic stenosis is present. Aortic valve mean gradient measures 2.0 mmHg. Aortic valve peak gradient measures 4.7 mmHg. Aortic valve area, by VTI measures 3.04 cm. Pulmonic Valve: The pulmonic valve was normal in structure. Pulmonic valve regurgitation is not visualized. No evidence of pulmonic stenosis. Aorta: The aortic root is normal in size and structure. Venous: The inferior vena cava is normal in size with greater than 50% respiratory variability, suggesting right atrial pressure of 3 mmHg. IAS/Shunts: No atrial level shunt detected by color flow Doppler.  LEFT VENTRICLE PLAX 2D LVIDd:         4.19 cm LVIDs:         2.93 cm LV PW:         1.33 cm LV IVS:        0.95 cm LVOT diam:     2.00 cm LV SV:         55 LV SV Index:   26 LVOT Area:     3.14 cm  RIGHT VENTRICLE RV Basal diam:  4.55 cm RV S prime:     7.51 cm/s TAPSE (M-mode): 3.8 cm LEFT ATRIUM              Index  RIGHT ATRIUM           Index LA diam:        3.50 cm  1.68 cm/m   RA Area:     26.60 cm LA Vol (A2C):   93.4 ml  44.94 ml/m  RA Volume:   96.30 ml  46.34 ml/m LA Vol (A4C):   128.0 ml 61.59 ml/m LA Biplane Vol: 113.0 ml 54.37 ml/m  AORTIC VALVE                    PULMONIC VALVE AV Area (Vmax):    2.83 cm     PV Vmax:        0.96 m/s AV Area (Vmean):   2.84 cm     PV Peak grad:   3.6 mmHg AV Area (VTI):     3.04 cm     RVOT Peak grad: 4 mmHg AV Vmax:           108.00 cm/s AV Vmean:          70.100 cm/s AV VTI:            0.181 m AV Peak Grad:      4.7 mmHg AV  Mean Grad:      2.0 mmHg LVOT Vmax:         97.30 cm/s LVOT Vmean:        63.300 cm/s LVOT VTI:          0.175 m LVOT/AV VTI ratio: 0.97  AORTA Ao Root diam: 3.30 cm MITRAL VALVE                TRICUSPID VALVE MV Area (PHT): 4.52 cm     TR Peak grad:   34.8 mmHg MV Decel Time: 168 msec     TR Vmax:        295.00 cm/s MV E velocity: 134.00 cm/s                             SHUNTS                             Systemic VTI:  0.18 m                             Systemic Diam: 2.00 cm Ida Rogue MD Electronically signed by Ida Rogue MD Signature Date/Time: 10/23/2020/5:30:24 PM    Final    CT Renal Stone Study  Result Date: 10/20/2020 CLINICAL DATA:  Intermittent weakness, feeling as if he would faint. EXAM: CT ABDOMEN AND PELVIS WITHOUT CONTRAST TECHNIQUE: Multidetector CT imaging of the abdomen and pelvis was performed following the standard protocol without IV contrast. COMPARISON:  September 18, 2020 FINDINGS: Lower chest: Moderate severity chronic appearing fibrotic changes are seen involving the bilateral lung bases. Small bilateral pleural effusions are noted. Hepatobiliary: No focal liver abnormality is seen. No gallstones, gallbladder wall thickening, or biliary dilatation. Pancreas: Unremarkable. No pancreatic ductal dilatation or surrounding inflammatory changes. Spleen: A punctate calcified granuloma is seen within an otherwise normal-appearing spleen. Adrenals/Urinary Tract: Adrenal glands are unremarkable. Kidneys are normal in size, without focal lesions. A 2 mm nonobstructing renal stone is seen within the upper pole of the right kidney. Clusters of subcentimeter nonobstructing renal stones are seen within the mid and lower left kidney. A left-sided endo ureteral stent is in  place. The proximal portion of the stent is seen within the proximal left ureter, just beyond the left UPJ. The distal end extends into the urinary bladder, continues through the prostate gland in serpentine fashion to the  proximal to mid portion of the urethra (axial CT image 97, CT series 2). The urinary bladder is empty and subsequently limited in evaluation. Stomach/Bowel: Stomach is within normal limits. Appendix appears normal. No evidence of bowel dilatation. Numerous diverticula are seen within the descending and sigmoid colon. Vascular/Lymphatic: Aortic atherosclerosis. No enlarged abdominal or pelvic lymph nodes. Reproductive: The prostate gland is moderately enlarged. A moderate amount of prostate gland calcification is seen. Other: A 4.4 cm x 2.7 cm fat containing right inguinal hernia is seen. No abdominopelvic ascites. Musculoskeletal: Multilevel degenerative changes seen throughout the lumbar spine. IMPRESSION: 1. Left-sided endo ureteral stent which may be malpositioned, as described above. Confirmation of the desired positioning is recommended. 2. Bilateral nonobstructing renal stones. 3. Small bilateral pleural effusions. 4. Colonic diverticulosis. Aortic Atherosclerosis (ICD10-I70.0). Electronically Signed   By: Virgina Norfolk M.D.   On: 10/20/2020 21:54    Subjective: Patient was seen and examined today.  No new complaints.  He thinks that he is at his baseline and wants to go home.  Able to participate with PT. at baseline oxygen requirement now.  Discharge Exam: Vitals:   10/31/20 0751 10/31/20 0847  BP:  120/75  Pulse:  71  Resp:  18  Temp:  97.9 F (36.6 C)  SpO2: 94% 97%   Vitals:   10/30/20 2021 10/31/20 0530 10/31/20 0751 10/31/20 0847  BP:  117/73  120/75  Pulse:  77  71  Resp:  20  18  Temp:  (!) 97.4 F (36.3 C)  97.9 F (36.6 C)  TempSrc:    Oral  SpO2: 95% 100% 94% 97%  Weight:      Height:        General: Pt is alert, awake, not in acute distress Cardiovascular: RRR, S1/S2 +, no rubs, no gallops Respiratory: CTA bilaterally, no wheezing, no rhonchi Abdominal: Soft, NT, ND, bowel sounds + Extremities: no edema, no cyanosis   The results of significant diagnostics  from this hospitalization (including imaging, microbiology, ancillary and laboratory) are listed below for reference.    Microbiology: Recent Results (from the past 240 hour(s))  Expectorated Sputum Assessment w Gram Stain, Rflx to Resp Cult     Status: None   Collection Time: 10/22/20  9:10 AM   Specimen: Expectorated Sputum  Result Value Ref Range Status   Specimen Description EXPECTORATED SPUTUM  Final   Special Requests NONE  Final   Sputum evaluation   Final    THIS SPECIMEN IS ACCEPTABLE FOR SPUTUM CULTURE Performed at [facility], [address], [place], [place] [address]    Report Status 10/22/2020 FINAL  Final  Culture, Respiratory w Gram Stain     Status: None   Collection Time: 10/22/20  9:10 AM  Result Value Ref Range Status   Specimen Description   Final    EXPECTORATED SPUTUM Performed at [facility], [address], [place], [place] [address]    Special Requests   Final    NONE Reflexed from [id number] Performed at [facility], [address] [place], [place] [address]    Gram Stain   Final    MODERATE WBC PRESENT,BOTH PMN AND MONONUCLEAR RARE GRAM NEGATIVE RODS RARE GRAM POSITIVE COCCI    Culture   Final  FEW Normal respiratory flora-no Staph aureus or Pseudomonas seen Performed at [facility] [address], [place], [place] [address]    Report Status 10/24/2020 FINAL  Final  MRSA Next Gen by PCR, Nasal     Status: None   Collection Time: 10/28/20 12:30 PM   Specimen: Nasal Mucosa; Nasal Swab  Result Value Ref Range Status   MRSA by PCR Next Gen NOT DETECTED NOT DETECTED Final    Comment: (NOTE) The GeneXpert MRSA Assay (FDA approved for NASAL specimens only), is one component of a comprehensive MRSA colonization surveillance program. It is not intended to diagnose MRSA infection nor to guide or monitor treatment for MRSA infections. Test performance is not FDA approved in patients less than 17  years old. Performed at [facility], [address] [place], [place] [address]      Labs: BNP (last 3 results) Recent Labs    10/06/20 1113 10/20/20 1733 10/22/20 0624  BNP 176.1* 230.9* 989.2*   Basic Metabolic Panel: Recent Labs  Lab 10/25/20 0543 10/26/20 0436 10/27/20 0044 10/27/20 2355 10/29/20 0451 10/31/20 0931  NA 137 135  --  136 135 138  K 4.1 3.8  --  4.2 3.7 3.6  CL 106 103  --  101 98 94*  CO2 27 27  --  26 30 35*  GLUCOSE 176* 139*  --  201* 141* 88  BUN 40* 43*  --  42* 45* 46*  CREATININE 0.93 1.24 1.05 1.17 1.11 1.04  CALCIUM 8.2* 8.5*  --  8.7* 8.7* 8.6*  MG  --   --  2.1 2.0 2.0  --   PHOS  --   --   --   --  3.4  --    Liver Function Tests: No results for input(s): AST, ALT, ALKPHOS, BILITOT, PROT, ALBUMIN in the last 168 hours. No results for input(s): LIPASE, AMYLASE in the last 168 hours. No results for input(s): AMMONIA in the last 168 hours. CBC: Recent Labs  Lab 10/26/20 0831 10/29/20 0451 10/31/20 0931  WBC 11.1* 16.5* 16.6*  HGB 10.3* 11.5* 12.7*  HCT 31.6* 35.3* 38.5*  MCV 94.6 95.4 95.5  PLT [phone]   Cardiac Enzymes: No results for input(s): CKTOTAL, CKMB, CKMBINDEX, TROPONINI in the last 168 hours. BNP: Invalid input(s): POCBNP CBG: No results for input(s): GLUCAP in the last 168 hours. D-Dimer No results for input(s): DDIMER in the last 72 hours. Hgb A1c No results for input(s): HGBA1C in the last 72 hours. Lipid Profile No results for input(s): CHOL, HDL, LDLCALC, TRIG, CHOLHDL, LDLDIRECT in the last 72 hours. Thyroid function studies No results for input(s): TSH, T4TOTAL, T3FREE, THYROIDAB in the last 72 hours.  Invalid input(s): FREET3 Anemia work up No results for input(s): VITAMINB12, FOLATE, FERRITIN, TIBC, IRON, RETICCTPCT in the last 72 hours. Urinalysis    Component Value Date/Time   COLORURINE YELLOW (A) 10/30/2020 1156   APPEARANCEUR CLEAR (A) 10/30/2020 1156   APPEARANCEUR Cloudy  (A) 10/08/2020 1545   LABSPEC 1.008 10/30/2020 1156   LABSPEC 1.014 08/10/2013 1825   PHURINE 5.0 10/30/2020 1156   GLUCOSEU NEGATIVE 10/30/2020 1156   GLUCOSEU Negative 08/10/2013 1825   HGBUR LARGE (A) 10/30/2020 1156   BILIRUBINUR NEGATIVE 10/30/2020 1156   BILIRUBINUR Negative 10/08/2020 1545   BILIRUBINUR 1+ 08/10/2013 1825   KETONESUR NEGATIVE 10/30/2020 1156   PROTEINUR 30 (A) 10/30/2020 1156   NITRITE NEGATIVE 10/30/2020 1156   LEUKOCYTESUR NEGATIVE 10/30/2020 1156   LEUKOCYTESUR  Negative 08/10/2013 1825   Sepsis Labs Invalid input(s): PROCALCITONIN,  WBC,  LACTICIDVEN Microbiology Recent Results (from the past 240 hour(s))  Expectorated Sputum Assessment w Gram Stain, Rflx to Resp Cult     Status: None   Collection Time: 10/22/20  9:10 AM   Specimen: Expectorated Sputum  Result Value Ref Range Status   Specimen Description EXPECTORATED SPUTUM  Final   Special Requests NONE  Final   Sputum evaluation   Final    THIS SPECIMEN IS ACCEPTABLE FOR SPUTUM CULTURE Performed at [facility], [address], [place], [place] [address]    Report Status 10/22/2020 FINAL  Final  Culture, Respiratory w Gram Stain     Status: None   Collection Time: 10/22/20  9:10 AM  Result Value Ref Range Status   Specimen Description   Final    EXPECTORATED SPUTUM Performed at [facility], [address] [place], [place] [address]    Special Requests   Final    NONE Reflexed from [id number] Performed at [facility], [address] [place], [place] [address]    Gram Stain   Final    MODERATE WBC PRESENT,BOTH PMN AND MONONUCLEAR RARE GRAM NEGATIVE RODS RARE GRAM POSITIVE COCCI    Culture   Final    FEW Normal respiratory flora-no Staph aureus or Pseudomonas seen Performed at [facility], [place] [address] [place], [place] [address]    Report Status 10/24/2020 FINAL  Final  MRSA Next Gen by PCR, Nasal     Status: None   Collection Time: 10/28/20 12:30  PM   Specimen: Nasal Mucosa; Nasal Swab  Result Value Ref Range Status   MRSA by PCR Next Gen NOT DETECTED NOT DETECTED Final    Comment: (NOTE) The GeneXpert MRSA Assay (FDA approved for NASAL specimens only), is one component of a comprehensive MRSA colonization surveillance program. It is not intended to diagnose MRSA infection nor to guide or monitor treatment for MRSA infections. Test performance is not FDA approved in patients less than 58 years old. Performed at [facility], [address] [place],  [address]     Time coordinating discharge: Over 30 minutes  SIGNED:  Lorella Nimrod, MD  [facility] 10/31/2020, 12:15 PM  If 7PM-7AM, please contact night-coverage [email or web address]  This record has been created using Systems analyst. Errors have been sought and corrected,but may not always be located. Such creation errors do not reflect on the standard of care.

## 2020-11-03 ENCOUNTER — Inpatient Hospital Stay
Admission: EM | Admit: 2020-11-03 | Discharge: 2020-11-06 | DRG: 641 | Disposition: A | Discharge status: Home / Self Care | Payer: Medicare HMO | Attending: Internal Medicine | Admitting: Internal Medicine

## 2020-11-03 ENCOUNTER — Emergency Department: Payer: Medicare HMO

## 2020-11-03 ENCOUNTER — Other Ambulatory Visit: Payer: Self-pay

## 2020-11-03 DIAGNOSIS — R1319 Other dysphagia: Secondary | ICD-10-CM | POA: Diagnosis not present

## 2020-11-03 DIAGNOSIS — Z7901 Long term (current) use of anticoagulants: Secondary | ICD-10-CM | POA: Diagnosis not present

## 2020-11-03 DIAGNOSIS — J9611 Chronic respiratory failure with hypoxia: Secondary | ICD-10-CM | POA: Diagnosis not present

## 2020-11-03 DIAGNOSIS — I10 Essential (primary) hypertension: Secondary | ICD-10-CM | POA: Diagnosis present

## 2020-11-03 DIAGNOSIS — R531 Weakness: Secondary | ICD-10-CM | POA: Diagnosis not present

## 2020-11-03 DIAGNOSIS — R1110 Vomiting, unspecified: Secondary | ICD-10-CM | POA: Diagnosis not present

## 2020-11-03 DIAGNOSIS — Z79899 Other long term (current) drug therapy: Secondary | ICD-10-CM | POA: Diagnosis not present

## 2020-11-03 DIAGNOSIS — F419 Anxiety disorder, unspecified: Secondary | ICD-10-CM | POA: Diagnosis not present

## 2020-11-03 DIAGNOSIS — I4891 Unspecified atrial fibrillation: Secondary | ICD-10-CM | POA: Insufficient documentation

## 2020-11-03 DIAGNOSIS — R911 Solitary pulmonary nodule: Secondary | ICD-10-CM | POA: Diagnosis present

## 2020-11-03 DIAGNOSIS — N400 Enlarged prostate without lower urinary tract symptoms: Secondary | ICD-10-CM | POA: Diagnosis present

## 2020-11-03 DIAGNOSIS — Z7982 Long term (current) use of aspirin: Secondary | ICD-10-CM

## 2020-11-03 DIAGNOSIS — E785 Hyperlipidemia, unspecified: Secondary | ICD-10-CM | POA: Diagnosis present

## 2020-11-03 DIAGNOSIS — J449 Chronic obstructive pulmonary disease, unspecified: Secondary | ICD-10-CM | POA: Diagnosis not present

## 2020-11-03 DIAGNOSIS — I2510 Atherosclerotic heart disease of native coronary artery without angina pectoris: Secondary | ICD-10-CM | POA: Diagnosis present

## 2020-11-03 DIAGNOSIS — J9610 Chronic respiratory failure, unspecified whether with hypoxia or hypercapnia: Secondary | ICD-10-CM | POA: Diagnosis not present

## 2020-11-03 DIAGNOSIS — I700 Atherosclerosis of aorta: Secondary | ICD-10-CM | POA: Diagnosis present

## 2020-11-03 DIAGNOSIS — Z87891 Personal history of nicotine dependence: Secondary | ICD-10-CM

## 2020-11-03 DIAGNOSIS — R9431 Abnormal electrocardiogram [ECG] [EKG]: Secondary | ICD-10-CM | POA: Diagnosis not present

## 2020-11-03 DIAGNOSIS — K219 Gastro-esophageal reflux disease without esophagitis: Secondary | ICD-10-CM | POA: Diagnosis present

## 2020-11-03 DIAGNOSIS — R197 Diarrhea, unspecified: Secondary | ICD-10-CM | POA: Diagnosis not present

## 2020-11-03 DIAGNOSIS — Z8616 Personal history of COVID-19: Secondary | ICD-10-CM | POA: Diagnosis not present

## 2020-11-03 DIAGNOSIS — R5381 Other malaise: Secondary | ICD-10-CM | POA: Diagnosis present

## 2020-11-03 DIAGNOSIS — R0602 Shortness of breath: Secondary | ICD-10-CM | POA: Diagnosis not present

## 2020-11-03 DIAGNOSIS — Z8249 Family history of ischemic heart disease and other diseases of the circulatory system: Secondary | ICD-10-CM | POA: Diagnosis not present

## 2020-11-03 DIAGNOSIS — D72829 Elevated white blood cell count, unspecified: Secondary | ICD-10-CM | POA: Diagnosis not present

## 2020-11-03 DIAGNOSIS — M5030 Other cervical disc degeneration, unspecified cervical region: Secondary | ICD-10-CM | POA: Diagnosis present

## 2020-11-03 DIAGNOSIS — Z9981 Dependence on supplemental oxygen: Secondary | ICD-10-CM

## 2020-11-03 DIAGNOSIS — E872 Acidosis, unspecified: Secondary | ICD-10-CM | POA: Diagnosis not present

## 2020-11-03 DIAGNOSIS — E860 Dehydration: Principal | ICD-10-CM | POA: Diagnosis present

## 2020-11-03 DIAGNOSIS — Z885 Allergy status to narcotic agent status: Secondary | ICD-10-CM

## 2020-11-03 DIAGNOSIS — Z20822 Contact with and (suspected) exposure to covid-19: Secondary | ICD-10-CM | POA: Diagnosis not present

## 2020-11-03 DIAGNOSIS — R1310 Dysphagia, unspecified: Secondary | ICD-10-CM

## 2020-11-03 DIAGNOSIS — I480 Paroxysmal atrial fibrillation: Secondary | ICD-10-CM | POA: Diagnosis present

## 2020-11-03 DIAGNOSIS — E890 Postprocedural hypothyroidism: Secondary | ICD-10-CM | POA: Diagnosis not present

## 2020-11-03 DIAGNOSIS — D72825 Bandemia: Secondary | ICD-10-CM

## 2020-11-03 DIAGNOSIS — J9601 Acute respiratory failure with hypoxia: Secondary | ICD-10-CM | POA: Diagnosis not present

## 2020-11-03 DIAGNOSIS — R0902 Hypoxemia: Secondary | ICD-10-CM | POA: Diagnosis not present

## 2020-11-03 DIAGNOSIS — R6520 Severe sepsis without septic shock: Secondary | ICD-10-CM | POA: Diagnosis not present

## 2020-11-03 DIAGNOSIS — A419 Sepsis, unspecified organism: Secondary | ICD-10-CM | POA: Diagnosis not present

## 2020-11-03 DIAGNOSIS — Z85828 Personal history of other malignant neoplasm of skin: Secondary | ICD-10-CM

## 2020-11-03 DIAGNOSIS — Z7951 Long term (current) use of inhaled steroids: Secondary | ICD-10-CM

## 2020-11-03 DIAGNOSIS — R110 Nausea: Secondary | ICD-10-CM | POA: Diagnosis not present

## 2020-11-03 DIAGNOSIS — R112 Nausea with vomiting, unspecified: Secondary | ICD-10-CM | POA: Diagnosis not present

## 2020-11-03 DIAGNOSIS — Z86718 Personal history of other venous thrombosis and embolism: Secondary | ICD-10-CM

## 2020-11-03 DIAGNOSIS — D509 Iron deficiency anemia, unspecified: Secondary | ICD-10-CM | POA: Diagnosis present

## 2020-11-03 DIAGNOSIS — Z8673 Personal history of transient ischemic attack (TIA), and cerebral infarction without residual deficits: Secondary | ICD-10-CM

## 2020-11-03 DIAGNOSIS — G459 Transient cerebral ischemic attack, unspecified: Secondary | ICD-10-CM | POA: Diagnosis not present

## 2020-11-03 LAB — COMPREHENSIVE METABOLIC PANEL
ALT: 35 U/L (ref 0–44)
AST: 21 U/L (ref 15–41)
Albumin: 3.4 g/dL — ABNORMAL LOW (ref 3.5–5.0)
Alkaline Phosphatase: 79 U/L (ref 38–126)
Anion gap: 6 (ref 5–15)
BUN: 44 mg/dL — ABNORMAL HIGH (ref 8–23)
CO2: 28 mmol/L (ref 22–32)
Calcium: 8.4 mg/dL — ABNORMAL LOW (ref 8.9–10.3)
Chloride: 106 mmol/L (ref 98–111)
Creatinine, Ser: 1.11 mg/dL (ref 0.61–1.24)
GFR, Estimated: >60 mL/min (ref >60)
Glucose, Bld: 134 mg/dL — ABNORMAL HIGH (ref 70–99)
Potassium: 4.2 mmol/L (ref 3.5–5.1)
Sodium: 140 mmol/L (ref 135–145)
Total Bilirubin: 0.8 mg/dL (ref 0.3–1.2)
Total Protein: 6.5 g/dL (ref 6.5–8.1)

## 2020-11-03 LAB — CBC WITH DIFFERENTIAL/PLATELET
Abs Immature Granulocytes: 0.24 10*3/uL — ABNORMAL HIGH (ref 0.00–0.07)
Basophils Absolute: 0 10*3/uL (ref 0.0–0.1)
Basophils Relative: 0 %
Eosinophils Absolute: 0 10*3/uL (ref 0.0–0.5)
Eosinophils Relative: 0 %
HCT: 38.9 % — ABNORMAL LOW (ref 39.0–52.0)
Hemoglobin: 12.9 g/dL — ABNORMAL LOW (ref 13.0–17.0)
Immature Granulocytes: 1 %
Lymphocytes Relative: 3 %
Lymphs Abs: 0.6 10*3/uL — ABNORMAL LOW (ref 0.7–4.0)
MCH: 32.1 pg (ref 26.0–34.0)
MCHC: 33.2 g/dL (ref 30.0–36.0)
MCV: 96.8 fL (ref 80.0–100.0)
Monocytes Absolute: 0.4 10*3/uL (ref 0.1–1.0)
Monocytes Relative: 2 %
Neutro Abs: 21.2 10*3/uL — ABNORMAL HIGH (ref 1.7–7.7)
Neutrophils Relative %: 94 %
Platelets: 237 10*3/uL (ref 150–400)
RBC: 4.02 MIL/uL — ABNORMAL LOW (ref 4.22–5.81)
RDW: 14.8 % (ref 11.5–15.5)
WBC: 22.5 10*3/uL — ABNORMAL HIGH (ref 4.0–10.5)
nRBC: 0 % (ref 0.0–0.2)

## 2020-11-03 LAB — CREATININE, SERUM
Creatinine, Ser: 0.89 mg/dL (ref 0.61–1.24)
GFR, Estimated: >60 mL/min (ref >60)

## 2020-11-03 LAB — CBC
HCT: 32.9 % — ABNORMAL LOW (ref 39.0–52.0)
Hemoglobin: 11.1 g/dL — ABNORMAL LOW (ref 13.0–17.0)
MCH: 32.4 pg (ref 26.0–34.0)
MCHC: 33.7 g/dL (ref 30.0–36.0)
MCV: 95.9 fL (ref 80.0–100.0)
Platelets: 173 10*3/uL (ref 150–400)
RBC: 3.43 MIL/uL — ABNORMAL LOW (ref 4.22–5.81)
RDW: 14.8 % (ref 11.5–15.5)
WBC: 14.2 10*3/uL — ABNORMAL HIGH (ref 4.0–10.5)
nRBC: 0 % (ref 0.0–0.2)

## 2020-11-03 LAB — RESP PANEL BY RT-PCR (FLU A&B, COVID) ARPGX2
Influenza A by PCR: NEGATIVE
Influenza B by PCR: NEGATIVE
SARS Coronavirus 2 by RT PCR: NEGATIVE

## 2020-11-03 LAB — URINALYSIS, ROUTINE W REFLEX MICROSCOPIC
Bacteria, UA: NONE SEEN
Bilirubin Urine: NEGATIVE
Glucose, UA: NEGATIVE mg/dL
Ketones, ur: NEGATIVE mg/dL
Leukocytes,Ua: NEGATIVE
Nitrite: NEGATIVE
Protein, ur: 30 mg/dL — AB
Specific Gravity, Urine: >1.046 — ABNORMAL HIGH (ref 1.005–1.030)
Squamous Epithelial / HPF: NONE SEEN (ref 0–5)
pH: 5 (ref 5.0–8.0)

## 2020-11-03 LAB — LIPASE, BLOOD: Lipase: 32 U/L (ref 11–51)

## 2020-11-03 LAB — LACTIC ACID, PLASMA
Lactic Acid, Venous: 2.5 mmol/L (ref 0.5–1.9)
Lactic Acid, Venous: 2.3 mmol/L (ref 0.5–1.9)

## 2020-11-03 MED ORDER — SODIUM CHLORIDE 0.9 % IV BOLUS
500.0000 mL | Freq: Once | INTRAVENOUS | Status: AC
  Administered 2020-11-03: 500 mL via INTRAVENOUS

## 2020-11-03 MED ORDER — ASPIRIN EC 81 MG PO TBEC
81.0000 mg | DELAYED_RELEASE_TABLET | Freq: Every day | ORAL | Status: DC
  Administered 2020-11-03 – 2020-11-06 (×4): 81 mg via ORAL
  Filled 2020-11-03 (×4): qty 1

## 2020-11-03 MED ORDER — SODIUM CHLORIDE 0.9 % IV SOLN
2.0000 g | Freq: Once | INTRAVENOUS | Status: AC
  Administered 2020-11-03: 2 g via INTRAVENOUS
  Filled 2020-11-03: qty 20

## 2020-11-03 MED ORDER — FAMOTIDINE 20 MG PO TABS
20.0000 mg | ORAL_TABLET | Freq: Every day | ORAL | Status: DC
  Administered 2020-11-03 – 2020-11-06 (×4): 20 mg via ORAL
  Filled 2020-11-03 (×4): qty 1

## 2020-11-03 MED ORDER — ONDANSETRON HCL 4 MG PO TABS
4.0000 mg | ORAL_TABLET | Freq: Four times a day (QID) | ORAL | Status: DC | PRN
  Administered 2020-11-05: 4 mg via ORAL
  Filled 2020-11-03: qty 1

## 2020-11-03 MED ORDER — SODIUM CHLORIDE 0.9 % IV BOLUS
1000.0000 mL | Freq: Once | INTRAVENOUS | Status: AC
  Administered 2020-11-03: 1000 mL via INTRAVENOUS

## 2020-11-03 MED ORDER — SODIUM CHLORIDE 0.9 % IV SOLN
INTRAVENOUS | Status: DC
  Administered 2020-11-06: 1000 mL via INTRAVENOUS

## 2020-11-03 MED ORDER — ACETAMINOPHEN 650 MG RE SUPP: 650.0000 mg | Freq: Four times a day (QID) | RECTAL | Status: DC | PRN

## 2020-11-03 MED ORDER — APIXABAN 5 MG PO TABS
5.0000 mg | ORAL_TABLET | Freq: Two times a day (BID) | ORAL | Status: DC
  Administered 2020-11-03 – 2020-11-06 (×6): 5 mg via ORAL
  Filled 2020-11-03 (×7): qty 1

## 2020-11-03 MED ORDER — ATORVASTATIN CALCIUM 20 MG PO TABS
40.0000 mg | ORAL_TABLET | Freq: Every day | ORAL | Status: DC
  Administered 2020-11-03 – 2020-11-06 (×4): 40 mg via ORAL
  Filled 2020-11-03 (×4): qty 2

## 2020-11-03 MED ORDER — METRONIDAZOLE 500 MG/100ML IV SOLN
500.0000 mg | Freq: Once | INTRAVENOUS | Status: AC
  Administered 2020-11-03: 500 mg via INTRAVENOUS
  Filled 2020-11-03: qty 100

## 2020-11-03 MED ORDER — LINEZOLID 600 MG/300ML IV SOLN
600.0000 mg | Freq: Two times a day (BID) | INTRAVENOUS | Status: DC
  Administered 2020-11-03: 600 mg via INTRAVENOUS
  Filled 2020-11-03 (×2): qty 300

## 2020-11-03 MED ORDER — TAMSULOSIN HCL 0.4 MG PO CAPS
0.4000 mg | ORAL_CAPSULE | Freq: Every day | ORAL | Status: DC
  Administered 2020-11-03 – 2020-11-06 (×4): 0.4 mg via ORAL
  Filled 2020-11-03 (×5): qty 1

## 2020-11-03 MED ORDER — METRONIDAZOLE 500 MG/100ML IV SOLN
500.0000 mg | Freq: Three times a day (TID) | INTRAVENOUS | Status: DC
  Administered 2020-11-03 – 2020-11-04 (×3): 500 mg via INTRAVENOUS
  Filled 2020-11-03 (×5): qty 100

## 2020-11-03 MED ORDER — PANTOPRAZOLE SODIUM 40 MG PO TBEC
40.0000 mg | DELAYED_RELEASE_TABLET | Freq: Every day | ORAL | Status: DC
  Administered 2020-11-03 – 2020-11-06 (×4): 40 mg via ORAL
  Filled 2020-11-03 (×4): qty 1

## 2020-11-03 MED ORDER — ONDANSETRON HCL 4 MG/2ML IJ SOLN: 4.0000 mg | Freq: Four times a day (QID) | INTRAMUSCULAR | Status: DC | PRN

## 2020-11-03 MED ORDER — MONTELUKAST SODIUM 10 MG PO TABS
10.0000 mg | ORAL_TABLET | Freq: Every day | ORAL | Status: DC
  Administered 2020-11-03 – 2020-11-05 (×3): 10 mg via ORAL
  Filled 2020-11-03 (×3): qty 1

## 2020-11-03 MED ORDER — FERROUS GLUCONATE 324 (38 FE) MG PO TABS
324.0000 mg | ORAL_TABLET | Freq: Every day | ORAL | Status: DC
  Administered 2020-11-03 – 2020-11-06 (×4): 324 mg via ORAL
  Filled 2020-11-03 (×5): qty 1

## 2020-11-03 MED ORDER — ACETAMINOPHEN 325 MG PO TABS: 650.0000 mg | ORAL_TABLET | Freq: Four times a day (QID) | ORAL | Status: DC | PRN

## 2020-11-03 MED ORDER — MOMETASONE FURO-FORMOTEROL FUM 200-5 MCG/ACT IN AERO
2.0000 | INHALATION_SPRAY | Freq: Two times a day (BID) | RESPIRATORY_TRACT | Status: DC
  Administered 2020-11-03 – 2020-11-05 (×5): 2 via RESPIRATORY_TRACT
  Filled 2020-11-03: qty 8.8

## 2020-11-03 MED ORDER — ENOXAPARIN SODIUM 40 MG/0.4ML IJ SOSY: 40.0000 mg | PREFILLED_SYRINGE | INTRAMUSCULAR | Status: DC

## 2020-11-03 MED ORDER — IOHEXOL 300 MG/ML  SOLN
100.0000 mL | Freq: Once | INTRAMUSCULAR | Status: AC | PRN
  Administered 2020-11-03: 100 mL via INTRAVENOUS

## 2020-11-03 NOTE — ED Notes (Signed)
Pt taken to CT at this time.

## 2020-11-03 NOTE — ED Notes (Signed)
Family updated to current plan of care.

## 2020-11-03 NOTE — Sepsis Progress Note (Signed)
Code Sepsis protocol being monitored by eLink. 

## 2020-11-03 NOTE — H&P (Signed)
[place] [facility] [place] NAME: [name] Browning    MR#:  [id number]  DATE OF BIRTH:  02-14-1940  DATE OF ADMISSION:  11/03/2020  PRIMARY CARE PHYSICIAN: [place]   REQUESTING/REFERRING PHYSICIAN: Dr Cheri Fowler  Patient coming from :home   CHIEF COMPLAINT:   Nausea, diarrhea x1 day HISTORY OF PRESENT ILLNESS:  [name] Browning is a 80 y.o. male with extensive past medical history as below including CAD, A. fib, hypertension, hyperlipidemia, recent admission for sepsis due to VRE UTI, treated with linezolid, here with nausea, diarrhea, and general fatigue.  The patient states that since returning home, he has felt generally weak.  He has had intermittent abdominal cramping along with nausea, diarrhea, and difficulty keeping food down.  He has not wanted to eat or drink.  He has had general fatigue.  No family at bedside. Patient has not vomited or has had diarrhea here today. He is not yet given stool sample. He is receiving IV fluids. He got broad-spectrum antibiotic with Rocephin, linezolid, Flagyl as part of sepsis workup. Patient does not have fever. His hemodynamically stable at present.  Patient is going to be admitted with suspected sepsis source unknown.   PAST MEDICAL HISTORY:   Past Medical History:  Diagnosis Date  . Anginal pain ([place])   . Anxiety   . Aortic atherosclerosis ([place])   . Atrial fibrillation ([place]) 01/2019  . Bilateral carpal tunnel syndrome   . CAD (coronary artery disease)    a.) LHC 2004 --> normal coronaries. b.) normal stress test in 2007 and 2011; c.) Lexiscan 05/20/2014 --> LVEF 55-65%; no significant stress induced ischemia/arrythmia. d.) CT chest 03/25/2019 --> coronaries carcified.  . Carotid atherosclerosis, bilateral   . Carpal tunnel syndrome, bilateral   . Chronic anticoagulation    a.) ASA + apixaban  . COPD (chronic obstructive pulmonary disease) ([place])   . CVA (cerebral vascular accident) ([place])   .  Degenerative disc disease, cervical   . Diastolic dysfunction    a.) TTE 05/29/2014 --> LVEF 60-65%; G1DD.  Marland Kitchen DVT (deep venous thrombosis) ([place])   . GERD (gastroesophageal reflux disease)   . History of 2019 novel coronavirus disease (COVID-19) 02/08/2019  . HLD (hyperlipidemia)   . Hypertension   . Kidney stones   . Osteoarthritis of right shoulder   . Pneumonia   . Respiratory failure, acute ([place]) 09/20/2020   a.) severe respiratory distress 1 hour after urological surgery. CXR (+) for acute pulmonary edema. Transferred to ICU and placed on NIPPV. Questionable aspiration PNA. (+) A.fib with RVR. Improved with ABX, diuresis, and amiodarone.  . Skin cancer of face    a.) RIGHT ear and RIGHT forehead; excised.  . Syncope   . TIA (transient ischemic attack) 2016  . Valvular regurgitation    a.) TTE 05/26/2014 --> LVEF 60-65%; trivial MR, mild TR; no AR or PR. b.) TTE 04/20/2015 --> LVEF 55-60%; trivial MR and PR; no AR or TR.    PAST SURGICAL HISTOIRY:   Past Surgical History:  Procedure Laterality Date  . CARDIAC CATHETERIZATION  2004  . CARPAL TUNNEL RELEASE Right 06/11/2013  . CYSTOSCOPY W/ RETROGRADES  10/16/2020   Procedure: CYSTOSCOPY WITH RETROGRADE PYELOGRAM;  Surgeon: Billey Co, MD;  Location: [facility] ORS;  Service: Urology;;  . Consuela Mimes W/ URETERAL STENT PLACEMENT Left 09/20/2020   Procedure: CYSTOSCOPY WITH RETROGRADE PYELOGRAM/URETERAL STENT PLACEMENT;  Surgeon: Janith Lima, MD;  Location: [facility] ORS;  Service: Urology;  Laterality:  Left;  . CYSTOSCOPY/URETEROSCOPY/HOLMIUM LASER/STENT PLACEMENT Left 10/16/2020   Procedure: CYSTOSCOPY/URETEROSCOPY/HOLMIUM LASER/STENT PLACEMENT;  Surgeon: Billey Co, MD;  Location: [facility] ORS;  Service: Urology;  Laterality: Left;  . SHOULDER ARTHROSCOPY WITH OPEN ROTATOR CUFF REPAIR Right 08/24/2017   Procedure: SHOULDER ARTHROSCOPY WITH OPEN ROTATOR CUFF REPAIR;  Surgeon: Corky Mull, MD;  Location: [facility] ORS;  Service:  Orthopedics;  Laterality: Right;  . SKIN CANCER EXCISION  12/01/2016   right ear   . SKIN CANCER EXCISION     remove from the right side of the face   . THROMBECTOMY Right 2004   leg  . THYROIDECTOMY  1950   Not sure if total or partial thyroidectomy.    SOCIAL HISTORY:   Social History   Tobacco Use  . Smoking status: Former    Packs/day: 1.00    Years: 46.00    Pack years: 46.00    Types: Cigarettes    Start date: 01/10/1957    Quit date: 02/11/2003    Years since quitting: 17.7  . Smokeless tobacco: Former    Types: Snuff    Quit date: 02/11/2003  Substance Use Topics  . Alcohol use: Not Currently    Alcohol/week: 7.0 standard drinks    Types: 7 Cans of beer per week    FAMILY HISTORY:   Family History  Problem Relation Age of Onset  . Hypertension Mother   . Heart disease Mother   . CAD Father   . Heart attack Father     DRUG ALLERGIES:   Allergies  Allergen Reactions  . Hydromorphone     Hallucination Pt reports he doesn't know about this    REVIEW OF SYSTEMS:  Review of Systems  Constitutional:  Positive for malaise/fatigue. Negative for chills, fever and weight loss.  HENT:  Negative for ear discharge, ear pain and nosebleeds.   Eyes:  Negative for blurred vision, pain and discharge.  Respiratory:  Negative for sputum production, shortness of breath, wheezing and stridor.   Cardiovascular:  Negative for chest pain, palpitations, orthopnea and PND.  Gastrointestinal:  Positive for diarrhea and nausea. Negative for abdominal pain and vomiting.  Genitourinary:  Negative for frequency and urgency.  Musculoskeletal:  Negative for back pain and joint pain.  Neurological:  Positive for weakness. Negative for sensory change, speech change and focal weakness.  Psychiatric/Behavioral:  Negative for depression and hallucinations. The patient is not nervous/anxious.     MEDICATIONS AT HOME:   Prior to Admission medications   Medication Sig Start Date End Date  Taking? Authorizing Provider  acetaminophen (TYLENOL) 500 MG tablet Take 2 tablets (1,000 mg total) by mouth every 6 (six) hours as needed for mild pain. 05/07/20   Olean Ree, MD  amLODipine (NORVASC) 5 MG tablet Take 1 tablet (5 mg total) by mouth daily after supper. 09/28/20   Pokhrel, Corrie Mckusick, MD  apixaban (ELIQUIS) 5 MG TABS tablet Take 1 tablet (5 mg total) by mouth 2 (two) times daily. 09/28/20 10/28/20  Pokhrel, Corrie Mckusick, MD  aspirin EC 81 MG tablet Take 81 mg by mouth daily after supper.    [provider]  atorvastatin (LIPITOR) 40 MG tablet Take 40 mg by mouth daily after supper.    [provider]  cetirizine (ZYRTEC) 10 MG tablet Take 10 mg by mouth daily. 09/29/20   [provider]  famotidine (PEPCID) 20 MG tablet One after supper 10/06/20   Tanda Rockers, MD  Ferrous Gluconate (IRON) 240 (27 Fe) MG TABS Take  27 mg by mouth daily after supper. 09/21/18   [provider]  furosemide (LASIX) 20 MG tablet Take 1 tablet (20 mg total) by mouth daily after breakfast. 09/28/20 09/28/21  Pokhrel, Corrie Mckusick, MD  guaiFENesin-dextromethorphan (ROBITUSSIN DM) 100-10 MG/5ML syrup Take 5 mLs by mouth every 4 (four) hours as needed for cough. 09/28/20   Pokhrel, Corrie Mckusick, MD  isosorbide mononitrate (IMDUR) 30 MG 24 hr tablet Take 30 mg by mouth daily. 10/19/20   [provider]  linezolid (ZYVOX) 600 MG tablet Take 1 tablet (600 mg total) by mouth every 12 (twelve) hours for 4 days. 10/31/20 11/04/20  Lorella Nimrod, MD  metoprolol succinate (TOPROL-XL) 25 MG 24 hr tablet Take 0.5 tablets (12.5 mg total) by mouth 2 (two) times daily. 10/31/20   Lorella Nimrod, MD  montelukast (SINGULAIR) 10 MG tablet Take 10 mg by mouth at bedtime.    [provider]  pantoprazole (PROTONIX) 40 MG tablet Take 40 mg by mouth daily after supper. 12/10/18   [provider]  phenazopyridine (PYRIDIUM) 200 MG tablet Take 1 tablet (200 mg total) by mouth 3 (three) times  daily as needed (bladder pain). 09/28/20   Pokhrel, Corrie Mckusick, MD  polyethylene glycol (MIRALAX / GLYCOLAX) 17 g packet Take 17 g by mouth daily as needed for mild constipation or moderate constipation. Patient taking differently: Take 17 g by mouth daily. 09/28/20   Pokhrel, Corrie Mckusick, MD  predniSONE (DELTASONE) 10 MG tablet Take 4 tablets (40 mg total) by mouth daily with breakfast. For 3 days, then decrease to 3 tablets for another 2 days, decrease to 2 tablets for 2 more days, then continue taking 1 pill tell you will finish your medication. 11/01/20   Lorella Nimrod, MD  SYMBICORT 160-4.5 MCG/ACT inhaler Inhale 2 puffs into the lungs 2 (two) times daily as needed (shortness of breath). 12/07/18   [provider]  tamsulosin (FLOMAX) 0.4 MG CAPS capsule Take 1 capsule (0.4 mg total) by mouth daily. 10/08/20   Billey Co, MD      VITAL SIGNS:  Blood pressure 111/75, pulse 76, temperature 97.7 F (36.5 C), temperature source Oral, resp. rate 17, height 6\' 2"  (1.88 m), weight 81.6 kg, SpO2 100 %.  PHYSICAL EXAMINATION:  GENERAL:  80 y.o.-year-old patient lying in the bed with no acute distress.  HEENT: Head atraumatic, normocephalic. Oropharynx dry  LUNGS: Normal breath sounds bilaterally, no wheezing, rales,rhonchi or crepitation. No use of accessory muscles of respiration.  CARDIOVASCULAR: S1, S2 normal. No murmurs, rubs, or gallops.  ABDOMEN: Soft, nontender, mild distended. Bowel sounds present. No organomegaly or mass.  EXTREMITIES: No pedal edema, cyanosis, or clubbing.  NEUROLOGIC:non focal PSYCHIATRIC: The patient is alert and oriented x 2 SKIN: No obvious rash, lesion, or ulcer.   LABORATORY PANEL:   CBC Recent Labs  Lab 11/03/20 1310  WBC 22.5*  HGB 12.9*  HCT 38.9*  PLT 237   ------------------------------------------------------------------------------------------------------------------  Chemistries  Recent Labs  Lab 10/29/20 0451 10/31/20 0931  11/03/20 1310  NA 135   < > 140  K 3.7   < > 4.2  CL 98   < > 106  CO2 30   < > 28  GLUCOSE 141*   < > 134*  BUN 45*   < > 44*  CREATININE 1.11   < > 1.11  CALCIUM 8.7*   < > 8.4*  MG 2.0  --   --   AST  --   --  21  ALT  --   --  35  ALKPHOS  --   --  79  BILITOT  --   --  0.8   < > = values in this interval not displayed.   ------------------------------------------------------------------------------------------------------------------  Cardiac Enzymes No results for input(s): TROPONINI in the last 168 hours. ------------------------------------------------------------------------------------------------------------------  RADIOLOGY:  CT ABDOMEN PELVIS W CONTRAST  Result Date: 11/03/2020 CLINICAL DATA:  Nausea and vomiting EXAM: CT ABDOMEN AND PELVIS WITH CONTRAST TECHNIQUE: Multidetector CT imaging of the abdomen and pelvis was performed using the standard protocol following bolus administration of intravenous contrast. CONTRAST:  151mL OMNIPAQUE IOHEXOL 300 MG/ML  SOLN COMPARISON:  CT abdomen and pelvis dated October 20, 2020 FINDINGS: Lower chest: Cardiomegaly. Emphysema. Solid pulmonary nodules of the left lower lobe. Largest measures up to 7 mm and is located on series 2, image 98. Hepatobiliary: No focal liver abnormality is seen. No gallstones, gallbladder wall thickening, or biliary dilatation. Pancreas: Unremarkable. No pancreatic ductal dilatation or surrounding inflammatory changes. Spleen: Normal in size without focal abnormality. Adrenals/Urinary Tract: Bilateral adrenal glands are unremarkable. No hydronephrosis. Bilateral nonobstructing stones, left greater than right. Bladder is unremarkable. Stomach/Bowel: Stomach is within normal limits. Appendix appears normal. Diverticulosis. No evidence of bowel wall thickening, distention, or inflammatory changes. Vascular/Lymphatic: Aortic atherosclerosis. No enlarged abdominal or pelvic lymph nodes. Reproductive: Prostatomegaly,  measuring up to 5.8 cm. Other: Small right greater than left fat containing inguinal hernias. Musculoskeletal: Unchanged L4 compression deformity. No aggressive appearing osseous lesions. IMPRESSION: Diverticulosis with no evidence of diverticulitis. No acute findings abdomen or pelvis. Solid pulmonary nodules of the left lower lobe, largest measures up to 7 mm. Non-contrast chest CT at 6-12 months is recommended. If the nodule is stable at time of repeat CT, then future CT at 18-24 months (from today's scan) is considered optional for low-risk patients, but is recommended for high-risk patients. This recommendation follows the consensus statement: Guidelines for Management of Incidental Pulmonary Nodules Detected on CT Images: From the Fleischner Society 2017; Radiology 2017; 284:228-243. Aortic Atherosclerosis (ICD10-I70.0) and Emphysema (ICD10-J43.9). Electronically Signed   By: Yetta Glassman M.D.   On: 11/03/2020 15:48    EKG:    IMPRESSION AND PLAN:  [name] Browning is a 80 y.o. male with extensive past medical history as below including CAD, A. fib, hypertension, hyperlipidemia, recent admission for sepsis due to VRE UTI, treated with linezolid, here with nausea, diarrhea, and general fatigue.  The patient states that since returning home, he has felt generally weak.  Severe Sepsis unknown source -- patient came in with nausea diarrhea weakness and fatigue -- he was recently admitted and discharged on October 22 with VRE UTI. Today is his last dose of linezolid. -- Came in with tachycardia, hypotension that improved with fluids, elevated lactic acid and leukocytosis -- no diarrhea yet. Patient has not given stool sample -- I will finish a dose of IV linezolid tonight. -- Continue IV Flagyl for now -- discontinue Rocephin -- consider antibiotics according to patient's blood work, blood culture, urine culture and stool studies -- continue aggressive IV hydration -- lactic acid 2.5-- 2.3 -- CT  abdomen no acute abnormality noted  Leukocytosis -- as above -- CBC and morning  solid pulmonary nodule left lower lobe 7 mm -- follow-up as outpatient with PCP  Chronic respiratory failure secondary to COPD on chronic home oxygen -- continue inhalers/nebs  paroxysmal atrial fibrillation -- heart rate stable -- continue eliquis  PT OT to see patient  Family Communication : none Consults : none Code Status :  full DVT prophylaxis : eliquis Level of care: Med-Surg  TOTAL TIME TAKING CARE OF THIS PATIENT: 45 minutes.    Fritzi Mandes M.D  [facility]     CC: Primary care physician; [place]

## 2020-11-03 NOTE — ED Notes (Signed)
Critical Lactic of 2.5 called at this time on this pt. MD Isaacs notified via message at this time.

## 2020-11-03 NOTE — ED Triage Notes (Signed)
Pt BIB [facility] from home C/O nausea and diarrhea X 3 days. Pt wears 2L [place] at baseline.  Recent ICU admission for pneumonia, reports nausea, diarrhea, and generalized weakness since discharge.   18 LAC 80  97% 2L 115/75  CBG 132

## 2020-11-03 NOTE — Consult Note (Signed)
CODE SEPSIS - PHARMACY COMMUNICATION  **Broad Spectrum Antibiotics should be administered within 1 hour of Sepsis diagnosis**  Time Code Sepsis Called/Page Received: 9324  Antibiotics Ordered: 1991  Time of 1st antibiotic administration: Ben [place] ,PharmD, BCPS Clinical Pharmacist  11/03/2020  3:46 PM

## 2020-11-03 NOTE — ED Provider Notes (Signed)
[facility] Emergency Department Provider Note  ____________________________________________   Event Date/Time   First MD Initiated Contact with Patient 11/03/20 1301     (approximate)  I have reviewed the triage vital signs and the nursing notes.   HISTORY  Chief Complaint Nausea and Diarrhea    HPI [name] Browning is a 80 y.o. male with extensive past medical history as below including CAD, A. fib, hypertension, hyperlipidemia, recent admission for sepsis due to VRE UTI, treated with linezolid, here with nausea, diarrhea, and general fatigue.  The patient states that since returning home, he has felt generally weak.  He has had intermittent abdominal cramping along with nausea, diarrhea, and difficulty keeping food down.  He has not wanted to eat or drink.  He has had general fatigue.  He has had chills but no known fevers.  He states his breathing is about as good as it was at discharge, not necessarily worse.  Is been using his home oxygen.  Denies any increased feeding production.  No urinary symptoms.    Past Medical History:  Diagnosis Date   Anginal pain ([place])    Anxiety    Aortic atherosclerosis ([place])    Atrial fibrillation ([place]) 01/2019   Bilateral carpal tunnel syndrome    CAD (coronary artery disease)    a.) LHC 2004 --> normal coronaries. b.) normal stress test in 2007 and 2011; c.) Lexiscan 05/20/2014 --> LVEF 55-65%; no significant stress induced ischemia/arrythmia. d.) CT chest 03/25/2019 --> coronaries carcified.   Carotid atherosclerosis, bilateral    Carpal tunnel syndrome, bilateral    Chronic anticoagulation    a.) ASA + apixaban   COPD (chronic obstructive pulmonary disease) (HCC)    CVA (cerebral vascular accident) ([place])    Degenerative disc disease, cervical    Diastolic dysfunction    a.) TTE 05/29/2014 --> LVEF 60-65%; G1DD.   DVT (deep venous thrombosis) (HCC)    GERD (gastroesophageal reflux disease)    History of 2019  novel coronavirus disease (COVID-19) 02/08/2019   HLD (hyperlipidemia)    Hypertension    Kidney stones    Osteoarthritis of right shoulder    Pneumonia    Respiratory failure, acute ([place]) 09/20/2020   a.) severe respiratory distress 1 hour after urological surgery. CXR (+) for acute pulmonary edema. Transferred to ICU and placed on NIPPV. Questionable aspiration PNA. (+) A.fib with RVR. Improved with ABX, diuresis, and amiodarone.   Skin cancer of face    a.) RIGHT ear and RIGHT forehead; excised.   Syncope    TIA (transient ischemic attack) 2016   Valvular regurgitation    a.) TTE 05/26/2014 --> LVEF 60-65%; trivial MR, mild TR; no AR or PR. b.) TTE 04/20/2015 --> LVEF 55-60%; trivial MR and PR; no AR or TR.    Patient Active Problem List   Diagnosis Date Noted   Septic shock ([place]) 10/21/2020   Acute on chronic respiratory failure with hypoxia ([place]) 10/20/2020   Community acquired pneumonia 10/20/2020   Sepsis ([place]) 10/20/2020   Hypotension 10/20/2020   DOE (dyspnea on exertion) 10/06/2020   Chronic respiratory failure with hypoxia ([place]) 10/06/2020   Malnutrition of moderate degree 09/21/2020   Urinary tract obstruction by kidney stone 09/19/2020   AKI (acute kidney injury) ([place]) 09/19/2020   COPD exacerbation ([place]) 09/18/2020   Acute respiratory failure with hypoxia ([place]) 09/18/2020   AF (paroxysmal atrial fibrillation) ([place]) 44/03/4740   Umbilical hernia without obstruction and without gangrene    Pneumonia 03/25/2019  History of 2019 novel coronavirus disease (COVID-19) 03/06/2019   COVID-19 virus infection 02/08/2019   New onset atrial fibrillation ([place]) 02/08/2019   COPD, moderate ([place]) 09/14/2018   Degenerative tear of glenoid labrum of right shoulder 08/24/2017   Injury of tendon of long head of right biceps 08/21/2017   Rotator cuff tendinitis, right 08/21/2017   Traumatic complete tear of right rotator cuff 08/21/2017   Chronic neck pain 03/09/2017   Degenerative  disc disease, cervical 03/09/2017   Perennial allergic rhinitis 03/09/2017   Hyperglycemia 09/01/2015   Elevated rheumatoid factor 04/08/2015   Arthritis of both hands 04/01/2015   Bilateral hand pain 03/18/2015   Elevated alkaline phosphatase level 03/02/2015   Anxiety 02/16/2015   Intermittent chest pain 02/16/2015   History of TIA (transient ischemic attack) 05/18/2014   HTN (hypertension) 05/18/2014   Hyperlipidemia 05/18/2014   Status post carpal tunnel release 06/26/2013    Past Surgical History:  Procedure Laterality Date   CARDIAC CATHETERIZATION  2004   CARPAL TUNNEL RELEASE Right 06/11/2013   CYSTOSCOPY W/ RETROGRADES  10/16/2020   Procedure: CYSTOSCOPY WITH RETROGRADE PYELOGRAM;  Surgeon: Billey Co, MD;  Location: [facility] ORS;  Service: Urology;;   CYSTOSCOPY W/ URETERAL STENT PLACEMENT Left 09/20/2020   Procedure: CYSTOSCOPY WITH RETROGRADE PYELOGRAM/URETERAL STENT PLACEMENT;  Surgeon: Janith Lima, MD;  Location: [facility] ORS;  Service: Urology;  Laterality: Left;   CYSTOSCOPY/URETEROSCOPY/HOLMIUM LASER/STENT PLACEMENT Left 10/16/2020   Procedure: CYSTOSCOPY/URETEROSCOPY/HOLMIUM LASER/STENT PLACEMENT;  Surgeon: Billey Co, MD;  Location: [facility] ORS;  Service: Urology;  Laterality: Left;   SHOULDER ARTHROSCOPY WITH OPEN ROTATOR CUFF REPAIR Right 08/24/2017   Procedure: SHOULDER ARTHROSCOPY WITH OPEN ROTATOR CUFF REPAIR;  Surgeon: Corky Mull, MD;  Location: [facility] ORS;  Service: Orthopedics;  Laterality: Right;   SKIN CANCER EXCISION  12/01/2016   right ear    SKIN CANCER EXCISION     remove from the right side of the face    THROMBECTOMY Right 2004   leg   THYROIDECTOMY  1950   Not sure if total or partial thyroidectomy.    Prior to Admission medications   Medication Sig Start Date End Date Taking? Authorizing Provider  acetaminophen (TYLENOL) 500 MG tablet Take 2 tablets (1,000 mg total) by mouth every 6 (six) hours as needed for mild pain. 05/07/20   Olean Ree, MD  amLODipine (NORVASC) 5 MG tablet Take 1 tablet (5 mg total) by mouth daily after supper. 09/28/20   Pokhrel, Corrie Mckusick, MD  apixaban (ELIQUIS) 5 MG TABS tablet Take 1 tablet (5 mg total) by mouth 2 (two) times daily. 09/28/20 10/28/20  Pokhrel, Corrie Mckusick, MD  aspirin EC 81 MG tablet Take 81 mg by mouth daily after supper.    [provider]  atorvastatin (LIPITOR) 40 MG tablet Take 40 mg by mouth daily after supper.    [provider]  cetirizine (ZYRTEC) 10 MG tablet Take 10 mg by mouth daily. 09/29/20   [provider]  famotidine (PEPCID) 20 MG tablet One after supper 10/06/20   Tanda Rockers, MD  Ferrous Gluconate (IRON) 240 (27 Fe) MG TABS Take 27 mg by mouth daily after supper. 09/21/18   [provider]  furosemide (LASIX) 20 MG tablet Take 1 tablet (20 mg total) by mouth daily after breakfast. 09/28/20 09/28/21  Pokhrel, Corrie Mckusick, MD  guaiFENesin-dextromethorphan (ROBITUSSIN DM) 100-10 MG/5ML syrup Take 5 mLs by mouth every 4 (four) hours as needed for cough. 09/28/20   Flora Lipps, MD  isosorbide  mononitrate (IMDUR) 30 MG 24 hr tablet Take 30 mg by mouth daily. 10/19/20   [provider]  linezolid (ZYVOX) 600 MG tablet Take 1 tablet (600 mg total) by mouth every 12 (twelve) hours for 4 days. 10/31/20 11/04/20  Lorella Nimrod, MD  metoprolol succinate (TOPROL-XL) 25 MG 24 hr tablet Take 0.5 tablets (12.5 mg total) by mouth 2 (two) times daily. 10/31/20   Lorella Nimrod, MD  montelukast (SINGULAIR) 10 MG tablet Take 10 mg by mouth at bedtime.    [provider]  pantoprazole (PROTONIX) 40 MG tablet Take 40 mg by mouth daily after supper. 12/10/18   [provider]  phenazopyridine (PYRIDIUM) 200 MG tablet Take 1 tablet (200 mg total) by mouth 3 (three) times daily as needed (bladder pain). 09/28/20   Pokhrel, Corrie Mckusick, MD  polyethylene glycol (MIRALAX / GLYCOLAX) 17 g packet Take 17 g by mouth daily as needed for mild constipation or  moderate constipation. Patient taking differently: Take 17 g by mouth daily. 09/28/20   Pokhrel, Corrie Mckusick, MD  predniSONE (DELTASONE) 10 MG tablet Take 4 tablets (40 mg total) by mouth daily with breakfast. For 3 days, then decrease to 3 tablets for another 2 days, decrease to 2 tablets for 2 more days, then continue taking 1 pill tell you will finish your medication. 11/01/20   Lorella Nimrod, MD  SYMBICORT 160-4.5 MCG/ACT inhaler Inhale 2 puffs into the lungs 2 (two) times daily as needed (shortness of breath). 12/07/18   [provider]  tamsulosin (FLOMAX) 0.4 MG CAPS capsule Take 1 capsule (0.4 mg total) by mouth daily. 10/08/20   Billey Co, MD    Allergies Hydromorphone  Family History  Problem Relation Age of Onset   Hypertension Mother    Heart disease Mother    CAD Father    Heart attack Father     Social History Social History   Tobacco Use   Smoking status: Former    Packs/day: 1.00    Years: 46.00    Pack years: 46.00    Types: Cigarettes    Start date: 01/10/1957    Quit date: 02/11/2003    Years since quitting: 17.7   Smokeless tobacco: Former    Types: Snuff    Quit date: 02/11/2003  Vaping Use   Vaping Use: Never used  Substance Use Topics   Alcohol use: Not Currently    Alcohol/week: 7.0 standard drinks    Types: 7 Cans of beer per week   Drug use: No    Review of Systems  Review of Systems  Constitutional:  Positive for chills and fatigue. Negative for fever.  HENT:  Negative for sore throat.   Respiratory:  Positive for chest tightness. Negative for shortness of breath.   Cardiovascular:  Negative for chest pain.  Gastrointestinal:  Positive for diarrhea, nausea and vomiting. Negative for abdominal pain.  Genitourinary:  Negative for flank pain.  Musculoskeletal:  Negative for neck pain.  Skin:  Negative for rash and wound.  Allergic/Immunologic: Negative for immunocompromised state.  Neurological:  Positive for weakness. Negative for  numbness.  Hematological:  Does not bruise/bleed easily.  All other systems reviewed and are negative.   ____________________________________________  PHYSICAL EXAM:      VITAL SIGNS: ED Triage Vitals  Enc Vitals Group     BP 11/03/20 1257 118/64     Pulse Rate 11/03/20 1257 85     Resp 11/03/20 1257 15     Temp 11/03/20 1257 97.7 F (  36.5 C)     Temp Source 11/03/20 1257 Oral     SpO2 11/03/20 1257 98 %     Weight 11/03/20 1258 180 lb (81.6 kg)     Height 11/03/20 1258 6\' 2"  (1.88 m)     Head Circumference --      Peak Flow --      Pain Score 11/03/20 1258 0     Pain Loc --      Pain Edu? --      Excl. in [place]? --      Physical Exam Vitals and nursing note reviewed.  Constitutional:      General: He is not in acute distress.    Appearance: He is well-developed.  HENT:     Head: Normocephalic and atraumatic.     Mouth/Throat:     Mouth: Mucous membranes are dry.  Eyes:     Conjunctiva/sclera: Conjunctivae normal.  Cardiovascular:     Rate and Rhythm: Normal rate and regular rhythm.     Heart sounds: Normal heart sounds. No murmur heard.   No friction rub.  Pulmonary:     Effort: Pulmonary effort is normal. No respiratory distress.     Breath sounds: Normal breath sounds. No wheezing or rales.  Abdominal:     General: Bowel sounds are increased. There is no distension.     Palpations: Abdomen is soft.     Tenderness: There is generalized abdominal tenderness.  Musculoskeletal:     Cervical back: Neck supple.  Skin:    General: Skin is warm.     Capillary Refill: Capillary refill takes less than 2 seconds.  Neurological:     Mental Status: He is alert and oriented to person, place, and time.     Motor: No abnormal muscle tone.      ____________________________________________   LABS (all labs ordered are listed, but only abnormal results are displayed)  Labs Reviewed  CBC WITH DIFFERENTIAL/PLATELET - Abnormal; Notable for the following components:       Result Value   WBC 22.5 (*)    RBC 4.02 (*)    Hemoglobin 12.9 (*)    HCT 38.9 (*)    Neutro Abs 21.2 (*)    Lymphs Abs 0.6 (*)    Abs Immature Granulocytes 0.24 (*)    All other components within normal limits  COMPREHENSIVE METABOLIC PANEL - Abnormal; Notable for the following components:   Glucose, Bld 134 (*)    BUN 44 (*)    Calcium 8.4 (*)    Albumin 3.4 (*)    All other components within normal limits  LACTIC ACID, PLASMA - Abnormal; Notable for the following components:   Lactic Acid, Venous 2.5 (*)    All other components within normal limits  RESP PANEL BY RT-PCR (FLU A&B, COVID) [id number]  CULTURE, BLOOD (SINGLE)  C DIFFICILE QUICK SCREEN W PCR REFLEX    GASTROINTESTINAL PANEL BY PCR, STOOL (REPLACES STOOL CULTURE)  CULTURE, BLOOD (SINGLE)  URINE CULTURE  LIPASE, BLOOD  LACTIC ACID, PLASMA  URINALYSIS, ROUTINE W REFLEX MICROSCOPIC    ____________________________________________  EKG: Atrial fibrillation, ventricular rate 81.  QRS 91, QTc 446.  Right axis deviation.  No acute ST elevations or depressions. ________________________________________  RADIOLOGY All imaging, including plain films, CT scans, and ultrasounds, independently reviewed by me, and interpretations confirmed via formal radiology reads.  ED MD interpretation:   CT: Pending  Official radiology report(s): No results found.  ____________________________________________  PROCEDURES   Procedure(s) performed (including  Critical Care):  .Critical Care Performed by: Duffy Bruce, MD Authorized by: Duffy Bruce, MD   Critical care provider statement:    Critical care time (minutes):  35   Critical care time was exclusive of:  Separately billable procedures and treating other patients   Critical care was necessary to treat or prevent imminent or life-threatening deterioration of the following conditions:  Cardiac failure, circulatory failure, respiratory failure and sepsis   Critical care  was time spent personally by me on the following activities:  Development of treatment plan with patient or surrogate, evaluation of patient's response to treatment, examination of patient, obtaining history from patient or surrogate, ordering and performing treatments and interventions, ordering and review of laboratory studies, ordering and review of radiographic studies, pulse oximetry, re-evaluation of patient's condition and review of old charts   I assumed direction of critical care for this patient from another provider in my specialty: no    ____________________________________________  INITIAL IMPRESSION / MDM / [place] / ED COURSE  As part of my medical decision making, I reviewed the following data within the [facility] notes reviewed and incorporated, Old chart reviewed, Notes from prior ED visits, and [place] Controlled Substance Database       *RYKIN ROUTE was evaluated in Emergency Department on 11/03/2020 for the symptoms described in the history of present illness. He was evaluated in the context of the global COVID-19 pandemic, which necessitated consideration that the patient might be at risk for infection with the SARS-CoV-2 virus that causes COVID-19. Institutional protocols and algorithms that pertain to the evaluation of patients at risk for COVID-19 are in a state of rapid change based on information released by regulatory bodies including the CDC and federal and state organizations. These policies and algorithms were followed during the patient's care in the ED.  Some ED evaluations and interventions may be delayed as a result of limited staffing during the pandemic.*     Medical Decision Making: 80 year old male here with general fatigue, nausea, diarrhea.  Patient has an extensive history including recent hospitalization for VRE UTI.  I independently reviewed these records.  He was discharged on linezolid.  Clinically, suspect possible C.  difficile or other antibiotic associated diarrhea.  That being said, patient does meet sepsis criteria with hypotension and significant leukocytosis with left shift with white count 22.5.  Lactic acid also elevated at 2.5.  CMP with elevated BUN to creatinine ratio consistent with volume depletion.  Will check a CT abdomen/pelvis, cover empirically.  I discussed antibiotic options with pharmacy, who recommended Rocephin and Flagyl for intra-abdominal coverage as well as C. difficile temporary coverage, as well as a dose of linezolid while awaiting urinalysis.  Patient is in agreement with this plan.  Will admit to medicine pending CT results.  ____________________________________________  FINAL CLINICAL IMPRESSION(S) / ED DIAGNOSES  Final diagnoses:  Sepsis without acute organ dysfunction, due to unspecified organism ([place])  Lactic acidosis  Bandemia     MEDICATIONS GIVEN DURING THIS VISIT:  Medications  cefTRIAXone (ROCEPHIN) 2 g in sodium chloride 0.9 % 100 mL IVPB (has no administration in time range)  metroNIDAZOLE (FLAGYL) IVPB 500 mg (has no administration in time range)  linezolid (ZYVOX) IVPB 600 mg (has no administration in time range)  sodium chloride 0.9 % bolus 1,000 mL (0 mLs Intravenous Stopped 11/03/20 1434)  sodium chloride 0.9 % bolus 500 mL (0 mLs Intravenous Stopped 11/03/20 1500)  iohexol (OMNIPAQUE) 300 MG/ML solution 100  mL (100 mLs Intravenous Contrast Given 11/03/20 1505)     ED Discharge Orders     None        Note:  This document was prepared using Dragon voice recognition software and may include unintentional dictation errors.   Duffy Bruce, MD 11/03/20 [phone]

## 2020-11-03 NOTE — ED Notes (Signed)
Assumed care of patient, given urinal by request. Able to void sans complications. Pt changed into gown, given new nasal cannula for comfort. Meal tray set up for patient with clears. Call light in reach, denies other needs at this time.

## 2020-11-04 DIAGNOSIS — J9601 Acute respiratory failure with hypoxia: Secondary | ICD-10-CM

## 2020-11-04 DIAGNOSIS — R6520 Severe sepsis without septic shock: Secondary | ICD-10-CM | POA: Diagnosis not present

## 2020-11-04 DIAGNOSIS — A419 Sepsis, unspecified organism: Secondary | ICD-10-CM

## 2020-11-04 LAB — CBC
HCT: 30.9 % — ABNORMAL LOW (ref 39.0–52.0)
Hemoglobin: 10.1 g/dL — ABNORMAL LOW (ref 13.0–17.0)
MCH: 31.6 pg (ref 26.0–34.0)
MCHC: 32.7 g/dL (ref 30.0–36.0)
MCV: 96.6 fL (ref 80.0–100.0)
Platelets: 146 10*3/uL — ABNORMAL LOW (ref 150–400)
RBC: 3.2 MIL/uL — ABNORMAL LOW (ref 4.22–5.81)
RDW: 14.6 % (ref 11.5–15.5)
WBC: 13.6 10*3/uL — ABNORMAL HIGH (ref 4.0–10.5)
nRBC: 0 % (ref 0.0–0.2)

## 2020-11-04 LAB — BASIC METABOLIC PANEL
Anion gap: 5 (ref 5–15)
BUN: 32 mg/dL — ABNORMAL HIGH (ref 8–23)
CO2: 25 mmol/L (ref 22–32)
Calcium: 7.4 mg/dL — ABNORMAL LOW (ref 8.9–10.3)
Chloride: 106 mmol/L (ref 98–111)
Creatinine, Ser: 0.87 mg/dL (ref 0.61–1.24)
GFR, Estimated: >60 mL/min (ref >60)
Glucose, Bld: 97 mg/dL (ref 70–99)
Potassium: 4.1 mmol/L (ref 3.5–5.1)
Sodium: 136 mmol/L (ref 135–145)

## 2020-11-04 LAB — URINE CULTURE: Culture: NO GROWTH

## 2020-11-04 NOTE — Evaluation (Signed)
Occupational Therapy Evaluation Patient Details Name: [name] Browning MRN: [id number] DOB: March 17, 1940 Today's Date: 11/04/2020   History of Present Illness Pt is a 80 y/o M with PMH: CAD, Afib, HTN, HLD, and recent admission for sepsis due to VRE UTI, treated with linezolid. Pt presented to ED d/t nausea, diarrhea, and general fatigue. Being w/u for severe sepsis of unknown source. No stool sample yet.   Clinical Impression   Pt seen for OT evaluation this date in setting of re-admission d/t sepsis. Pt reports being mobile at home, but endorses that it is still below his normal as he has declining ability to tolerate getting out of the house. Pt presents this date with decreased fxl activity tolerance, requiring CGA for sup<>Sit and MIN A for seated LB ADLs d/t fatigue/weakness. OT engages pt in seated UB g/h tasks with SETUP. He's returned to bed as LPN presents to address IV concerns (leaking). Will continue to follow acutely to progress mobilizations and improve pt tolerance for fxl tasks.      Recommendations for follow up therapy are one component of a multi-disciplinary discharge planning process, led by the attending physician.  Recommendations may be updated based on patient status, additional functional criteria and insurance authorization.   Follow Up Recommendations  Home health OT    Assistance Recommended at Discharge Set up Supervision/Assistance  Functional Status Assessment  Patient has had a recent decline in their functional status and demonstrates the ability to make significant improvements in function in a reasonable and predictable amount of time.  Equipment Recommendations  BSC;Tub/shower seat    Recommendations for Other Services       Precautions / Restrictions Precautions Precautions: Fall Restrictions Weight Bearing Restrictions: No      Mobility Bed Mobility Overal bed mobility: Needs Assistance Bed Mobility: Supine to Sit;Sit to Supine     Supine to  sit: Min guard Sit to supine: Min guard   General bed mobility comments: increased time, use of rails, HOB elevated, small HHA to scoot    Transfers                   General transfer comment: deferred, RN presents to address IV      Balance Overall balance assessment: Needs assistance   Sitting balance-Leahy Scale: Good         Standing balance comment: deferred                           ADL either performed or assessed with clinical judgement   ADL Overall ADL's : Needs assistance/impaired                                       General ADL Comments: requires SETUP for seated UB ADLs, MIN A for seated LB ADLs, CGA  for bed mobility     Vision Patient Visual Report: No change from baseline       Perception     Praxis      Pertinent Vitals/Pain Pain Assessment: Faces Faces Pain Scale: Hurts a little bit Pain Location: discomfort d/t rash on face Pain Descriptors / Indicators: Discomfort;Tender Pain Intervention(s): Other (comment) (notified LPN)     Hand Dominance Right   Extremity/Trunk Assessment Upper Extremity Assessment Upper Extremity Assessment: Overall WFL for tasks assessed;Generalized weakness (ROM WFL, MMT grossly 4-/5)   Lower Extremity Assessment Lower Extremity  Assessment: Overall WFL for tasks assessed;Generalized weakness       Communication Communication Communication: No difficulties   Cognition Arousal/Alertness: Awake/alert Behavior During Therapy: WFL for tasks assessed/performed Overall Cognitive Status: Within Functional Limits for tasks assessed                                 General Comments: some slow processing, but pt is appropriate and oriented. Pleasant throughout     General Comments       Exercises Other Exercises Other Exercises: OT engages pt in g/h tasks   Shoulder Instructions      Home Living Family/patient expects to be discharged to:: Private  residence Living Arrangements: Spouse/significant other Available Help at Discharge: Family;Available 24 hours/day Type of Home: Mobile home Home Access: Stairs to enter Entrance Stairs-Number of Steps: 3 Entrance Stairs-Rails: Right;Left;Can reach both Home Layout: One level     Bathroom Shower/Tub: Occupational psychologist: Handicapped height     Home Equipment: Advice worker (2 wheels);Cane - single point   Additional Comments: does not use AD at baseline, Home O2 2-3L      Prior Functioning/Environment Prior Level of Function : Independent/Modified Independent;Needs assist       Physical Assist : ADLs (physical)   ADLs (physical): IADLs Mobility Comments: short distances, does endorse decreased tolerance for going out of the house in recent weeks          OT Problem List: Decreased strength;Decreased activity tolerance;Cardiopulmonary status limiting activity;Decreased knowledge of use of DME or AE      OT Treatment/Interventions: Self-care/ADL training;Therapeutic exercise;DME and/or AE instruction;Therapeutic activities;Energy conservation    OT Goals(Current goals can be found in the care plan section) Acute Rehab OT Goals Patient Stated Goal: to get better and stay out of the hospital OT Goal Formulation: With patient Time For Goal Achievement: 11/18/20 Potential to Achieve Goals: Good ADL Goals Pt Will Perform Lower Body Dressing: with modified independence Pt Will Transfer to Toilet: with modified independence Pt Will Perform Toileting - Clothing Manipulation and hygiene: with modified independence  OT Frequency: Min 1X/week   Barriers to D/C:            Co-evaluation              AM-PAC OT "6 Clicks" Daily Activity     Outcome Measure Help from another person eating meals?: None Help from another person taking care of personal grooming?: A Little Help from another person toileting, which includes using toliet, bedpan,  or urinal?: A Little Help from another person bathing (including washing, rinsing, drying)?: A Little Help from another person to put on and taking off regular upper body clothing?: None Help from another person to put on and taking off regular lower body clothing?: A Little 6 Click Score: 20   End of Session    Activity Tolerance: Patient tolerated treatment well Patient left: in bed;with call bell/phone within reach;with bed alarm set;with nursing/sitter in room (LPN present to address IV)  OT Visit Diagnosis: Muscle weakness (generalized) (M62.81)                Time: 1610-9604 OT Time Calculation (min): 23 min Charges:  OT General Charges $OT Visit: 1 Visit OT Evaluation $OT Eval Moderate Complexity: 1 Mod OT Treatments $Self Care/Home Management : 8-22 mins  Gerrianne Scale, [place], OTR/L ascom [phone] 11/04/20, 11:47 AM

## 2020-11-04 NOTE — ED Notes (Signed)
Chelsea RN aware of assigned bed 

## 2020-11-04 NOTE — Progress Notes (Signed)
PROGRESS NOTE    [name] Browning  KDX:[id number] DOB: [birth year] DOA: 11/03/2020 PCP: [facility]    Brief Narrative:  [name] Browning is a 80 year old male with past medical history significant for CAD, paroxysmal atrial fibrillation, essential hypertension, hyperlipidemia, recent VRE UTI treated with linezolid who presents to [facility] ED on 10/25 with nausea, diarrhea, and generalized fatigue.  Reports intermittent abdominal cramping and difficulty keeping food down.  Poor appetite.  Generalized fatigue since returning home.  In the ED, temperature 97.7 F, HR 85, RR 15, BP 118/64, SPO2 98% on room air.  Sodium 140, potassium 4.2, chloride 106, CO2 28, glucose 134, BUN 44, creatinine 1.11, lipase 32, AST 21, ALT 35, total bilirubin 0.8.  Lactic acid 2.5.  WBC 22.5, hemoglobin 12.9, platelets 237.  COVID-19 PCR negative.  Influenza A/B PCR negative.  Urinalysis with negative leukocytes, negative nitrite, no bacteria, 11-20 WBCs.  CT abdomen/pelvis with diverticulosis with no evidence of diverticulitis, no acute findings within the abdomen/pelvis.  Urine culture and blood cultures obtained.  Given elevated WBC count, lactic acidosis; patient was started on empiric antibiotics with   Assessment & Plan:   Active Problems:   Sepsis ([place])   Sepsis, ruled out Lactic acidosis Dehydration Patient presenting to [facility] ED with nausea, diarrhea and generalized fatigue.  Recently completed antibiotic course with linezolid for VRE UTI.  Since returning home has been having poor appetite, little oral intake and generalized weakness/fatigue.  Patient is afebrile with elevated WC count of 22.5 and lactic acidosis of 2.5.  CT abdomen/pelvis unrevealing.  Urinalysis negative.  Etiology of his elevated white count and lactic acid likely secondary to dehydration in the setting of poor oral intake and associated nausea/diarrhea. --WBC 22.5>14.2>13.6 --Blood cultures x 2: no growth <24h --Urine Culture::  Pending --No further diarrhea while inpatient, will discontinue C. difficile/GI PCR panel --Discontinue enteric precautions --Discontinue metronidazole --IVF hydration w/ NS at 14mL/hr --Follow CBC and lactic acid daily  Pulmonary nodules Incidental finding of solid pulmonary nodules left lower lobe, largest 7 mm. --Recommend repeat noncontrast CT chest 6/12 months.  Essential hypertension BP 121/71, well controlled.  At baseline on amlodipine 5 mg p.o. daily, furosemide 20 mg p.o. daily, metoprolol succinate 12.5 mg p.o. twice daily. --Continue to hold home and hypertensives --Continue aspirin and statin --Monitor BP closely  COPD At baseline on 2 L nasal cannula.  Also on montelukast, Symbicort. --Continue mometasone-formoterol as hospital substitution for Symbicort --Continue montelukast 10 mg p.o. nightly  BPH: Tamsulosin 0.4 mg p.o. daily  HLD: Atorvastatin 40 mg p.o. daily  GERD: Continue PPI  Iron deficiency anemia: Ferrous gluconate 3 and 24 mg p.o. daily  Paroxysmal atrial fibrillation --Apixaban 5 mg p.o. twice daily --Holding metoprolol succinate as above   DVT prophylaxis: SCDs Start: 11/03/20 1623 apixaban (ELIQUIS) tablet 5 mg    Code Status: Full Code Family Communication: No family present at bedside this morning  Disposition Plan:  Level of care: Med-Surg Status is: Inpatient  Remains inpatient appropriate because: Weakness, fatigue, dehydration requiring IV fluid hydration.  Pending PT/OT evaluation   Consultants:  None  Procedures:  None  Antimicrobials:  Flagyl 10/25 - 10/26 Ceftriaxone 10/25 - 10/25 Zyvox 10/25 - 10/25     Subjective: Patient seen examined bedside, resting comfortably.  No specific complaints this morning other than continued generalized weakness/fatigue.  Continues with no further diarrhea.  No other complaints or concerns at this time.  Denies headache, no fever/chills/night sweats, no current  nausea/vomiting/diarrhea, no chest  pain, palpitations, no shortness of breath, no abdominal pain, no paresthesias.  No acute events overnight per nursing staff.  Objective: Vitals:   11/04/20 0700 11/04/20 0937 11/04/20 1132 11/04/20 1707  BP: 107/64 135/71 (!) 110/59 121/71  Pulse: (!) 53 62 98 87  Resp: 16 18 16 17   Temp:  98.1 F (36.7 C) 98.4 F (36.9 C) 98 F (36.7 C)  TempSrc:      SpO2: 100% 99% 99% 97%  Weight:      Height:        Intake/Output Summary (Last 24 hours) at 11/04/2020 1802 Last data filed at 11/04/2020 1038 Gross per 24 hour  Intake 1826.26 ml  Output 150 ml  Net 1676.26 ml   Filed Weights   11/03/20 1258  Weight: 81.6 kg    Examination:  General exam: Appears calm and comfortable  Respiratory system: Clear to auscultation. Respiratory effort normal. Cardiovascular system: S1 & S2 heard, RRR. No JVD, murmurs, rubs, gallops or clicks. No pedal edema. Gastrointestinal system: Abdomen is nondistended, soft and nontender. No organomegaly or masses felt. Normal bowel sounds heard. Central nervous system: Alert and oriented. No focal neurological deficits. Extremities: Symmetric 5 x 5 power. Skin: No rashes, lesions or ulcers Psychiatry: Judgement and insight appear normal. Mood & affect appropriate.     Data Reviewed: I have personally reviewed following labs and imaging studies  CBC: Recent Labs  Lab 10/29/20 0451 10/31/20 0931 11/03/20 1310 11/03/20 1912 11/04/20 0617  WBC 16.5* 16.6* 22.5* 14.2* 13.6*  NEUTROABS  --   --  21.2*  --   --   HGB 11.5* 12.7* 12.9* 11.1* 10.1*  HCT 35.3* 38.5* 38.9* 32.9* 30.9*  MCV 95.4 95.5 96.8 95.9 96.6  PLT 266 271 237 173 992*   Basic Metabolic Panel: Recent Labs  Lab 10/29/20 0451 10/31/20 0931 11/03/20 1310 11/03/20 1912 11/04/20 0617  NA 135 138 140  --  136  K 3.7 3.6 4.2  --  4.1  CL 98 94* 106  --  106  CO2 30 35* 28  --  25  GLUCOSE 141* 88 134*  --  97  BUN 45* 46* 44*  --  32*   CREATININE 1.11 1.04 1.11 0.89 0.87  CALCIUM 8.7* 8.6* 8.4*  --  7.4*  MG 2.0  --   --   --   --   PHOS 3.4  --   --   --   --    GFR: Estimated Creatinine Clearance: 79.5 mL/min (by C-G formula based on SCr of 0.87 mg/dL). Liver Function Tests: Recent Labs  Lab 11/03/20 1310  AST 21  ALT 35  ALKPHOS 79  BILITOT 0.8  PROT 6.5  ALBUMIN 3.4*   Recent Labs  Lab 11/03/20 1310  LIPASE 32   No results for input(s): AMMONIA in the last 168 hours. Coagulation Profile: No results for input(s): INR, PROTIME in the last 168 hours. Cardiac Enzymes: No results for input(s): CKTOTAL, CKMB, CKMBINDEX, TROPONINI in the last 168 hours. BNP (last 3 results) No results for input(s): PROBNP in the last 8760 hours. HbA1C: No results for input(s): HGBA1C in the last 72 hours. CBG: No results for input(s): GLUCAP in the last 168 hours. Lipid Profile: No results for input(s): CHOL, HDL, LDLCALC, TRIG, CHOLHDL, LDLDIRECT in the last 72 hours. Thyroid Function Tests: No results for input(s): TSH, T4TOTAL, FREET4, T3FREE, THYROIDAB in the last 72 hours. Anemia Panel: No results for input(s): VITAMINB12, FOLATE, FERRITIN, TIBC, IRON, RETICCTPCT  in the last 72 hours. Sepsis Labs: Recent Labs  Lab 11/03/20 1326 11/03/20 1549  LATICACIDVEN 2.5* 2.3*    Recent Results (from the past 240 hour(s))  MRSA Next Gen by PCR, Nasal     Status: None   Collection Time: 10/28/20 12:30 PM   Specimen: Nasal Mucosa; Nasal Swab  Result Value Ref Range Status   MRSA by PCR Next Gen NOT DETECTED NOT DETECTED Final    Comment: (NOTE) The GeneXpert MRSA Assay (FDA approved for NASAL specimens only), is one component of a comprehensive MRSA colonization surveillance program. It is not intended to diagnose MRSA infection nor to guide or monitor treatment for MRSA infections. Test performance is not FDA approved in patients less than 73 years old. Performed at [facility], [address]  [place], [place] [address]   Blood culture (single)     Status: None (Preliminary result)   Collection Time: 11/03/20  1:26 PM   Specimen: BLOOD  Result Value Ref Range Status   Specimen Description BLOOD RIGHT [facility]  Final   Special Requests   Final    BOTTLES DRAWN AEROBIC AND ANAEROBIC Blood Culture adequate volume   Culture   Final    NO GROWTH < 24 HOURS Performed at [facility], [address] [place], [place] [address]    Report Status PENDING  Incomplete  Resp Panel by RT-PCR (Flu A&B, Covid) Nasopharyngeal Swab     Status: None   Collection Time: 11/03/20  1:26 PM   Specimen: Nasopharyngeal Swab; Nasopharyngeal(NP) swabs in vial transport medium  Result Value Ref Range Status   SARS Coronavirus 2 by RT PCR NEGATIVE NEGATIVE Final    Comment: (NOTE) SARS-CoV-2 target nucleic acids are NOT DETECTED.  The SARS-CoV-2 RNA is generally detectable in upper respiratory specimens during the acute phase of infection. The lowest concentration of SARS-CoV-2 viral copies this assay can detect is 138 copies/mL. A negative result does not preclude SARS-Cov-2 infection and should not be used as the sole basis for treatment or other patient management decisions. A negative result may occur with  improper specimen collection/handling, submission of specimen other than nasopharyngeal swab, presence of viral mutation(s) within the areas targeted by this assay, and inadequate number of viral copies(<138 copies/mL). A negative result must be combined with clinical observations, patient history, and epidemiological information. The expected result is Negative.  Fact Sheet for Patients:  EntrepreneurPulse.com.au  Fact Sheet for Healthcare Providers:  IncredibleEmployment.be  This test is no t yet approved or cleared by the [place] FDA and  has been authorized for detection and/or diagnosis of SARS-CoV-2 by FDA under an Emergency Use  Authorization (EUA). This EUA will remain  in effect (meaning this test can be used) for the duration of the COVID-19 declaration under Section 564(b)(1) of the Act, 21 U.S.C.section 360bbb-3(b)(1), unless the authorization is terminated  or revoked sooner.       Influenza A by PCR NEGATIVE NEGATIVE Final   Influenza B by PCR NEGATIVE NEGATIVE Final    Comment: (NOTE) The Xpert Xpress SARS-CoV-2/FLU/RSV plus assay is intended as an aid in the diagnosis of influenza from Nasopharyngeal swab specimens and should not be used as a sole basis for treatment. Nasal washings and aspirates are unacceptable for Xpert Xpress SARS-CoV-2/FLU/RSV testing.  Fact Sheet for Patients: EntrepreneurPulse.com.au  Fact Sheet for Healthcare Providers: IncredibleEmployment.be  This test is not yet approved or cleared by the [place] FDA and has been authorized for detection and/or diagnosis  of SARS-CoV-2 by FDA under an Emergency Use Authorization (EUA). This EUA will remain in effect (meaning this test can be used) for the duration of the COVID-19 declaration under Section 564(b)(1) of the Act, 21 U.S.C. section 360bbb-3(b)(1), unless the authorization is terminated or revoked.  Performed at [facility], [address] [place], [place] [address]   Blood culture (single)     Status: None (Preliminary result)   Collection Time: 11/03/20  2:30 PM   Specimen: BLOOD  Result Value Ref Range Status   Specimen Description BLOOD LEFT ANTECUBITAL  Final   Special Requests   Final    BOTTLES DRAWN AEROBIC AND ANAEROBIC Blood Culture results may not be optimal due to an excessive volume of blood received in culture bottles   Culture   Final    NO GROWTH < 24 HOURS Performed at [facility], [address] [place],  [address]    Report Status PENDING  Incomplete         Radiology Studies: CT ABDOMEN PELVIS W CONTRAST  Result  Date: 11/03/2020 CLINICAL DATA:  Nausea and vomiting EXAM: CT ABDOMEN AND PELVIS WITH CONTRAST TECHNIQUE: Multidetector CT imaging of the abdomen and pelvis was performed using the standard protocol following bolus administration of intravenous contrast. CONTRAST:  125mL OMNIPAQUE IOHEXOL 300 MG/ML  SOLN COMPARISON:  CT abdomen and pelvis dated October 20, 2020 FINDINGS: Lower chest: Cardiomegaly. Emphysema. Solid pulmonary nodules of the left lower lobe. Largest measures up to 7 mm and is located on series 2, image 98. Hepatobiliary: No focal liver abnormality is seen. No gallstones, gallbladder wall thickening, or biliary dilatation. Pancreas: Unremarkable. No pancreatic ductal dilatation or surrounding inflammatory changes. Spleen: Normal in size without focal abnormality. Adrenals/Urinary Tract: Bilateral adrenal glands are unremarkable. No hydronephrosis. Bilateral nonobstructing stones, left greater than right. Bladder is unremarkable. Stomach/Bowel: Stomach is within normal limits. Appendix appears normal. Diverticulosis. No evidence of bowel wall thickening, distention, or inflammatory changes. Vascular/Lymphatic: Aortic atherosclerosis. No enlarged abdominal or pelvic lymph nodes. Reproductive: Prostatomegaly, measuring up to 5.8 cm. Other: Small right greater than left fat containing inguinal hernias. Musculoskeletal: Unchanged L4 compression deformity. No aggressive appearing osseous lesions. IMPRESSION: Diverticulosis with no evidence of diverticulitis. No acute findings abdomen or pelvis. Solid pulmonary nodules of the left lower lobe, largest measures up to 7 mm. Non-contrast chest CT at 6-12 months is recommended. If the nodule is stable at time of repeat CT, then future CT at 18-24 months (from today's scan) is considered optional for low-risk patients, but is recommended for high-risk patients. This recommendation follows the consensus statement: Guidelines for Management of Incidental Pulmonary  Nodules Detected on CT Images: From the Fleischner Society 2017; Radiology 2017; 284:228-243. Aortic Atherosclerosis (ICD10-I70.0) and Emphysema (ICD10-J43.9). Electronically Signed   By: Yetta Glassman M.D.   On: 11/03/2020 15:48        Scheduled Meds:  apixaban  5 mg Oral BID   aspirin EC  81 mg Oral QPC supper   atorvastatin  40 mg Oral QPC supper   famotidine  20 mg Oral Daily   ferrous gluconate  324 mg Oral QPC supper   mometasone-formoterol  2 puff Inhalation BID   montelukast  10 mg Oral QHS   pantoprazole  40 mg Oral QPC supper   tamsulosin  0.4 mg Oral Daily   Continuous Infusions:  sodium chloride 100 mL/hr at 11/04/20 1017   metronidazole 500 mg (11/04/20 1439)     LOS: 1 day  Time spent: 39 minutes spent on chart review, discussion with nursing staff, consultants, updating family and interview/physical exam; more than 50% of that time was spent in counseling and/or coordination of care.    Rajvir Ernster J [place] DO [facility] Available via Epic secure chat 7am-7pm After these hours, please refer to coverage provider listed on [email or web address] 11/04/2020, 6:02 PM

## 2020-11-04 NOTE — Plan of Care (Signed)

## 2020-11-04 NOTE — ED Notes (Signed)
Temp in room increased r/t pt request. Pt in semi fowlers position resting. Call light in reach. Denies other needs at this time.

## 2020-11-04 NOTE — Evaluation (Signed)
Clinical/Bedside Swallow Evaluation Patient Details  Name: [name] Browning MRN: [id number] Date of Birth: [birth year]  Today's Date: 11/04/2020 Time: SLP Start Time (ACUTE ONLY): 1330 SLP Stop Time (ACUTE ONLY): 1400 SLP Time Calculation (min) (ACUTE ONLY): 30 min  Past Medical History:  Past Medical History:  Diagnosis Date   Anginal pain ([place])    Anxiety    Aortic atherosclerosis (HCC)    Atrial fibrillation ([place]) 01/2019   Bilateral carpal tunnel syndrome    CAD (coronary artery disease)    a.) LHC 2004 --> normal coronaries. b.) normal stress test in 2007 and 2011; c.) Lexiscan 05/20/2014 --> LVEF 55-65%; no significant stress induced ischemia/arrythmia. d.) CT chest 03/25/2019 --> coronaries carcified.   Carotid atherosclerosis, bilateral    Carpal tunnel syndrome, bilateral    Chronic anticoagulation    a.) ASA + apixaban   COPD (chronic obstructive pulmonary disease) (HCC)    CVA (cerebral vascular accident) ([place])    Degenerative disc disease, cervical    Diastolic dysfunction    a.) TTE 05/29/2014 --> LVEF 60-65%; G1DD.   DVT (deep venous thrombosis) (HCC)    GERD (gastroesophageal reflux disease)    History of 2019 novel coronavirus disease (COVID-19) 02/08/2019   HLD (hyperlipidemia)    Hypertension    Kidney stones    Osteoarthritis of right shoulder    Pneumonia    Respiratory failure, acute ([place]) 09/20/2020   a.) severe respiratory distress 1 hour after urological surgery. CXR (+) for acute pulmonary edema. Transferred to ICU and placed on NIPPV. Questionable aspiration PNA. (+) A.fib with RVR. Improved with ABX, diuresis, and amiodarone.   Skin cancer of face    a.) RIGHT ear and RIGHT forehead; excised.   Syncope    TIA (transient ischemic attack) 2016   Valvular regurgitation    a.) TTE 05/26/2014 --> LVEF 60-65%; trivial MR, mild TR; no AR or PR. b.) TTE 04/20/2015 --> LVEF 55-60%; trivial MR and PR; no AR or TR.   Past Surgical History:  Past Surgical  History:  Procedure Laterality Date   CARDIAC CATHETERIZATION  2004   CARPAL TUNNEL RELEASE Right 06/11/2013   CYSTOSCOPY W/ RETROGRADES  10/16/2020   Procedure: CYSTOSCOPY WITH RETROGRADE PYELOGRAM;  Surgeon: Billey Co, MD;  Location: [facility] ORS;  Service: Urology;;   CYSTOSCOPY W/ URETERAL STENT PLACEMENT Left 09/20/2020   Procedure: CYSTOSCOPY WITH RETROGRADE PYELOGRAM/URETERAL STENT PLACEMENT;  Surgeon: Janith Lima, MD;  Location: [facility] ORS;  Service: Urology;  Laterality: Left;   CYSTOSCOPY/URETEROSCOPY/HOLMIUM LASER/STENT PLACEMENT Left 10/16/2020   Procedure: CYSTOSCOPY/URETEROSCOPY/HOLMIUM LASER/STENT PLACEMENT;  Surgeon: Billey Co, MD;  Location: [facility] ORS;  Service: Urology;  Laterality: Left;   SHOULDER ARTHROSCOPY WITH OPEN ROTATOR CUFF REPAIR Right 08/24/2017   Procedure: SHOULDER ARTHROSCOPY WITH OPEN ROTATOR CUFF REPAIR;  Surgeon: Corky Mull, MD;  Location: [facility] ORS;  Service: Orthopedics;  Laterality: Right;   SKIN CANCER EXCISION  12/01/2016   right ear    SKIN CANCER EXCISION     remove from the right side of the face    THROMBECTOMY Right 2004   leg   THYROIDECTOMY  1950   Not sure if total or partial thyroidectomy.   HPI:  [name] Browning is a 80 y.o. male with extensive past medical history as below including CAD, A. fib, hypertension, hyperlipidemia, recent admission for sepsis due to VRE UTI, treated with linezolid, and GERD here with nausea, diarrhea, and general fatigue.  The patient states that since returning home, he  has felt generally weak.  He has had intermittent abdominal cramping along with nausea, diarrhea, and difficulty keeping food down.  He has not wanted to eat or drink.  He has had general fatigue.    Assessment / Plan / Recommendation  Clinical Impression  Pt presents with overtly functional oropharyngeal swallow in the setting of acute sepsis. No s/sx of aspiration despite challenging with serial straw sips and regular solids. Oral phase  unremarkable, mildly slowed d/t edentulous nature, but pt self manages with slow rate and liquid wash.   Esophageal indicators noted and reported including belching and globus sensation. Per pt report, pt with hx of "stomach surgery," hernia, and reflux. Pt denies currently being followed by GI at this time. Recommend GI consult.  Education provided to pt/daughter regarding aspiration precautions (slow rate, small bites, elevated HOB, and alert for PO intake), esophageal precautions (alternating solids/liquids and elevating HOB at least 30 min after intake), and recommendation for continued regular solids and thin liquids. Pt/daughter reported understanding.  Given overtly functional swallow ability, no further SLP dysphagia intervention indicated.  SLP Visit Diagnosis: Dysphagia, unspecified (R13.10)    Aspiration Risk  Mild aspiration risk    Diet Recommendation     Medication Administration: Whole meds with liquid    Other  Recommendations Recommended Consults: Consider GI evaluation Oral Care Recommendations: Oral care BID    Recommendations for follow up therapy are one component of a multi-disciplinary discharge planning process, led by the attending physician.  Recommendations may be updated based on patient status, additional functional criteria and insurance authorization.  Follow up Recommendations None        Swallow Study   General Date of Onset: 11/03/20 HPI: [name] Browning is a 80 y.o. male with extensive past medical history as below including CAD, A. fib, hypertension, hyperlipidemia, recent admission for sepsis due to VRE UTI, treated with linezolid, and GERD here with nausea, diarrhea, and general fatigue.  The patient states that since returning home, he has felt generally weak.  He has had intermittent abdominal cramping along with nausea, diarrhea, and difficulty keeping food down.  He has not wanted to eat or drink.  He has had general fatigue. Type of Study: Bedside  Swallow Evaluation Previous Swallow Assessment: distant past (2016) Diet Prior to this Study: Regular;Thin liquids Temperature Spikes Noted: No Respiratory Status: Nasal cannula (2 Liters) History of Recent Intubation: No Behavior/Cognition: Alert;Cooperative;Pleasant mood Oral Cavity Assessment: Within Functional Limits Oral Care Completed by SLP: No Oral Cavity - Dentition: Edentulous (does not wear dentures to eat) Vision: Functional for self-feeding Self-Feeding Abilities: Able to feed self Patient Positioning: Upright in bed Baseline Vocal Quality: Normal    Oral/Motor/Sensory Function Overall Oral Motor/Sensory Function: Within functional limits   Ice Chips Ice chips: Not tested   Thin Liquid Thin Liquid: Within functional limits Presentation: Straw    Nectar Thick Nectar Thick Liquid: Not tested   Honey Thick Honey Thick Liquid: Not tested   Puree Puree: Within functional limits Presentation: Self Fed;Spoon   Solid     Solid: Within functional limits Presentation: Self Fed     [place] Daija Routson MS, CCC-SLP  [place] C Liam Bossman 11/04/2020,2:09 PM
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
## 2020-11-05 ENCOUNTER — Inpatient Hospital Stay: Payer: Medicare HMO

## 2020-11-05 DIAGNOSIS — D72829 Elevated white blood cell count, unspecified: Secondary | ICD-10-CM

## 2020-11-05 DIAGNOSIS — R197 Diarrhea, unspecified: Secondary | ICD-10-CM

## 2020-11-05 DIAGNOSIS — E872 Acidosis, unspecified: Secondary | ICD-10-CM | POA: Diagnosis not present

## 2020-11-05 DIAGNOSIS — R112 Nausea with vomiting, unspecified: Secondary | ICD-10-CM

## 2020-11-05 DIAGNOSIS — E860 Dehydration: Secondary | ICD-10-CM | POA: Diagnosis not present

## 2020-11-05 DIAGNOSIS — R1310 Dysphagia, unspecified: Secondary | ICD-10-CM | POA: Diagnosis not present

## 2020-11-05 LAB — BASIC METABOLIC PANEL
Anion gap: 7 (ref 5–15)
BUN: 28 mg/dL — ABNORMAL HIGH (ref 8–23)
CO2: 25 mmol/L (ref 22–32)
Calcium: 8 mg/dL — ABNORMAL LOW (ref 8.9–10.3)
Chloride: 107 mmol/L (ref 98–111)
Creatinine, Ser: 0.97 mg/dL (ref 0.61–1.24)
GFR, Estimated: >60 mL/min (ref >60)
Glucose, Bld: 92 mg/dL (ref 70–99)
Potassium: 3.7 mmol/L (ref 3.5–5.1)
Sodium: 139 mmol/L (ref 135–145)

## 2020-11-05 LAB — CBC
HCT: 33.1 % — ABNORMAL LOW (ref 39.0–52.0)
Hemoglobin: 10.7 g/dL — ABNORMAL LOW (ref 13.0–17.0)
MCH: 30.5 pg (ref 26.0–34.0)
MCHC: 32.3 g/dL (ref 30.0–36.0)
MCV: 94.3 fL (ref 80.0–100.0)
Platelets: 138 10*3/uL — ABNORMAL LOW (ref 150–400)
RBC: 3.51 MIL/uL — ABNORMAL LOW (ref 4.22–5.81)
RDW: 15.1 % (ref 11.5–15.5)
WBC: 12.2 10*3/uL — ABNORMAL HIGH (ref 4.0–10.5)
nRBC: 0 % (ref 0.0–0.2)

## 2020-11-05 LAB — MAGNESIUM: Magnesium: 1.9 mg/dL (ref 1.7–2.4)

## 2020-11-05 LAB — LACTIC ACID, PLASMA: Lactic Acid, Venous: 1.7 mmol/L (ref 0.5–1.9)

## 2020-11-05 NOTE — TOC Progression Note (Signed)
Transition of Care [facility]) - Progression Note    Patient Details  Name: [name] Browning MRN: [id number] Date of Birth: 1940-12-16  Transition of Care [facility]) CM/SW [place], RN Phone Number: 11/05/2020, 4:19 PM  Clinical Narrative: Patient lives at home with family, no concerns about transporting to appointments or the pharmacy, as family assists with this.  Patient is able to take medications as prescriberd.  Home health recommended, patient is declining at this time, states he has family member who is a Marine scientist and that person can work with him.  Patient has [employer] oxygen, states there are no issues with this and he has no further concerns about returning home.  TOC contact information provided, TOC to follow to discharge.           Expected Discharge Plan and Services                                                 Social Determinants of Health (SDOH) Interventions    Readmission Risk Interventions No flowsheet data found.

## 2020-11-05 NOTE — Consult Note (Signed)
Lucilla Lame, MD [facility]  [address] [place], [place] [address] Phone: [phone] Fax : [phone]  Consultation  Referring Provider:     Dr. [place] Primary Care Physician:  [place], [place] Primary Gastroenterologist:  Althia Forts       Reason for Consultation:     Dysphagia  Date of Admission:  11/03/2020 Date of Consultation:  11/05/2020         HPI:   [name] Browning is a 80 y.o. male Who was admitted to the hospital for nausea and diarrhea on the 25th of this month.  The patient had been discharged 3 days prior.  The patient came in reporting general fatigue.  He states that it had started As regards home with intermittent abdominal cramps.  He now reports that he is not having any further symptoms of this but states that he has had problems with swallowing and a fullness in his throat for many months.  He cannot delineate exactly how long it has been. The patient has a history of coronary artery disease, A. Fib, hypertension, hyperlipidemia and recent sepsis due to a UTI. The patient had a speech therapy evaluation for swallowing and it was reported that:  Pt presents with overtly functional oropharyngeal swallow in the setting of acute sepsis. No s/sx of aspiration despite challenging with serial straw sips and regular solids. Oral phase unremarkable, mildly slowed d/t edentulous nature, but pt self manages with slow rate and liquid wash.   Esophageal indicators noted and reported including belching and globus sensation. Per pt report, pt with hx of "stomach surgery," hernia, and reflux. Pt denies currently being followed by GI at this time. Recommend GI consult.  What I came to see the patient today he had completely finished his lunch which was solid food without any difficulty.  Past Medical History:  Diagnosis Date  . Anginal pain ([place])   . Anxiety   . Aortic atherosclerosis ([place])   . Atrial fibrillation ([place]) 01/2019  . Bilateral carpal tunnel syndrome   .  CAD (coronary artery disease)    a.) LHC 2004 --> normal coronaries. b.) normal stress test in 2007 and 2011; c.) Lexiscan 05/20/2014 --> LVEF 55-65%; no significant stress induced ischemia/arrythmia. d.) CT chest 03/25/2019 --> coronaries carcified.  . Carotid atherosclerosis, bilateral   . Carpal tunnel syndrome, bilateral   . Chronic anticoagulation    a.) ASA + apixaban  . COPD (chronic obstructive pulmonary disease) ([place])   . CVA (cerebral vascular accident) ([place])   . Degenerative disc disease, cervical   . Diastolic dysfunction    a.) TTE 05/29/2014 --> LVEF 60-65%; G1DD.  Marland Kitchen DVT (deep venous thrombosis) ([place])   . GERD (gastroesophageal reflux disease)   . History of 2019 novel coronavirus disease (COVID-19) 02/08/2019  . HLD (hyperlipidemia)   . Hypertension   . Kidney stones   . Osteoarthritis of right shoulder   . Pneumonia   . Respiratory failure, acute ([place]) 09/20/2020   a.) severe respiratory distress 1 hour after urological surgery. CXR (+) for acute pulmonary edema. Transferred to ICU and placed on NIPPV. Questionable aspiration PNA. (+) A.fib with RVR. Improved with ABX, diuresis, and amiodarone.  . Skin cancer of face    a.) RIGHT ear and RIGHT forehead; excised.  . Syncope   . TIA (transient ischemic attack) 2016  . Valvular regurgitation    a.) TTE 05/26/2014 --> LVEF 60-65%; trivial MR, mild TR; no AR or PR. b.) TTE 04/20/2015 --> LVEF  55-60%; trivial MR and PR; no AR or TR.    Past Surgical History:  Procedure Laterality Date  . CARDIAC CATHETERIZATION  2004  . CARPAL TUNNEL RELEASE Right 06/11/2013  . CYSTOSCOPY W/ RETROGRADES  10/16/2020   Procedure: CYSTOSCOPY WITH RETROGRADE PYELOGRAM;  Surgeon: Billey Co, MD;  Location: [facility] ORS;  Service: Urology;;  . Consuela Mimes W/ URETERAL STENT PLACEMENT Left 09/20/2020   Procedure: CYSTOSCOPY WITH RETROGRADE PYELOGRAM/URETERAL STENT PLACEMENT;  Surgeon: Janith Lima, MD;  Location: [facility] ORS;  Service: Urology;   Laterality: Left;  . CYSTOSCOPY/URETEROSCOPY/HOLMIUM LASER/STENT PLACEMENT Left 10/16/2020   Procedure: CYSTOSCOPY/URETEROSCOPY/HOLMIUM LASER/STENT PLACEMENT;  Surgeon: Billey Co, MD;  Location: [facility] ORS;  Service: Urology;  Laterality: Left;  . SHOULDER ARTHROSCOPY WITH OPEN ROTATOR CUFF REPAIR Right 08/24/2017   Procedure: SHOULDER ARTHROSCOPY WITH OPEN ROTATOR CUFF REPAIR;  Surgeon: Corky Mull, MD;  Location: [facility] ORS;  Service: Orthopedics;  Laterality: Right;  . SKIN CANCER EXCISION  12/01/2016   right ear   . SKIN CANCER EXCISION     remove from the right side of the face   . THROMBECTOMY Right 2004   leg  . THYROIDECTOMY  1950   Not sure if total or partial thyroidectomy.    Prior to Admission medications   Medication Sig Start Date End Date Taking? Authorizing Provider  acetaminophen (TYLENOL) 500 MG tablet Take 2 tablets (1,000 mg total) by mouth every 6 (six) hours as needed for mild pain. 05/07/20  Yes Piscoya, Jacqulyn Bath, MD  amLODipine (NORVASC) 5 MG tablet Take 1 tablet (5 mg total) by mouth daily after supper. 09/28/20  Yes Pokhrel, Laxman, MD  aspirin EC 81 MG tablet Take 81 mg by mouth daily after supper.   Yes [provider]  atorvastatin (LIPITOR) 40 MG tablet Take 40 mg by mouth daily after supper.   Yes [provider]  cetirizine (ZYRTEC) 10 MG tablet Take 10 mg by mouth daily. 09/29/20  Yes [provider]  famotidine (PEPCID) 20 MG tablet One after supper 10/06/20  Yes Tanda Rockers, MD  Ferrous Gluconate (IRON) 240 (27 Fe) MG TABS Take 27 mg by mouth daily after supper. 09/21/18  Yes [provider]  furosemide (LASIX) 20 MG tablet Take 1 tablet (20 mg total) by mouth daily after breakfast. 09/28/20 09/28/21 Yes Pokhrel, Laxman, MD  isosorbide mononitrate (IMDUR) 30 MG 24 hr tablet Take 30 mg by mouth daily. 10/19/20  Yes [provider]  metoprolol succinate (TOPROL-XL) 25 MG 24 hr tablet Take 0.5 tablets (12.5 mg total)  by mouth 2 (two) times daily. 10/31/20  Yes Lorella Nimrod, MD  montelukast (SINGULAIR) 10 MG tablet Take 10 mg by mouth at bedtime.   Yes [provider]  pantoprazole (PROTONIX) 40 MG tablet Take 40 mg by mouth daily after supper. 12/10/18  Yes [provider]  phenazopyridine (PYRIDIUM) 200 MG tablet Take 1 tablet (200 mg total) by mouth 3 (three) times daily as needed (bladder pain). 09/28/20  Yes Pokhrel, Corrie Mckusick, MD  predniSONE (DELTASONE) 10 MG tablet Take 4 tablets (40 mg total) by mouth daily with breakfast. For 3 days, then decrease to 3 tablets for another 2 days, decrease to 2 tablets for 2 more days, then continue taking 1 pill tell you will finish your medication. 11/01/20  Yes Lorella Nimrod, MD  SYMBICORT 160-4.5 MCG/ACT inhaler Inhale 2 puffs into the lungs 2 (two) times daily as needed (shortness of breath). 12/07/18  Yes [provider]  tamsulosin (  FLOMAX) 0.4 MG CAPS capsule Take 1 capsule (0.4 mg total) by mouth daily. 10/08/20  Yes Billey Co, MD  apixaban (ELIQUIS) 5 MG TABS tablet Take 1 tablet (5 mg total) by mouth 2 (two) times daily. 09/28/20 10/28/20  Pokhrel, Corrie Mckusick, MD  guaiFENesin-dextromethorphan (ROBITUSSIN DM) 100-10 MG/5ML syrup Take 5 mLs by mouth every 4 (four) hours as needed for cough. 09/28/20   Pokhrel, Corrie Mckusick, MD  polyethylene glycol (MIRALAX / GLYCOLAX) 17 g packet Take 17 g by mouth daily as needed for mild constipation or moderate constipation. Patient taking differently: Take 17 g by mouth daily. 09/28/20   Pokhrel, Corrie Mckusick, MD    Family History  Problem Relation Age of Onset  . Hypertension Mother   . Heart disease Mother   . CAD Father   . Heart attack Father      Social History   Tobacco Use  . Smoking status: Former    Packs/day: 1.00    Years: 46.00    Pack years: 46.00    Types: Cigarettes    Start date: 01/10/1957    Quit date: 02/11/2003    Years since quitting: 17.7  . Smokeless tobacco: Former    Types:  Snuff    Quit date: 02/11/2003  Vaping Use  . Vaping Use: Never used  Substance Use Topics  . Alcohol use: Not Currently    Alcohol/week: 7.0 standard drinks    Types: 7 Cans of beer per week  . Drug use: No    Allergies as of 11/03/2020 - Review Complete 11/03/2020  Allergen Reaction Noted  . Hydromorphone  09/04/2017    Review of Systems:    All systems reviewed and negative except where noted in HPI.   Physical Exam:  Vital signs in last 24 hours: Temp:  [97.7 F (36.5 C)-98 F (36.7 C)] 97.8 F (36.6 C) (10/27 1550) Pulse Rate:  [85-96] 85 (10/27 1550) Resp:  [17-19] 19 (10/27 1550) BP: (92-136)/(58-77) 106/59 (10/27 1550) SpO2:  [96 %-99 %] 98 % (10/27 1550) Last BM Date: 11/05/20 General:   Pleasant, cooperative in NAD Head:  Normocephalic and atraumatic. Eyes:   No icterus.   Conjunctiva pink. PERRLA. Ears:  Normal auditory acuity. Neck:  Supple; no masses or thyroidomegaly Lungs: Respirations even and unlabored. Lungs clear to auscultation bilaterally.   No wheezes, crackles, or rhonchi.  Heart:  Regular rate and rhythm;  Without murmur, clicks, rubs or gallops Abdomen:  Soft, nondistended, nontender. Normal bowel sounds. No appreciable masses or hepatomegaly.  No rebound or guarding.  Rectal:  Not performed. Msk:  Symmetrical without gross deformities.   Extremities:  Without edema, cyanosis or clubbing. Neurologic:  Alert and oriented x3;  grossly normal neurologically. Skin:  Intact without significant lesions or rashes. Cervical Nodes:  No significant cervical adenopathy. Psych:  Alert and cooperative. Normal affect.  LAB RESULTS: Recent Labs    11/03/20 1912 11/04/20 0617 11/05/20 0437  WBC 14.2* 13.6* 12.2*  HGB 11.1* 10.1* 10.7*  HCT 32.9* 30.9* 33.1*  PLT 173 146* 138*   BMET Recent Labs    11/03/20 1310 11/03/20 1912 11/04/20 0617 11/05/20 0437  NA 140  --  136 139  K 4.2  --  4.1 3.7  CL 106  --  106 107  CO2 28  --  25 25  GLUCOSE  134*  --  97 92  BUN 44*  --  32* 28*  CREATININE 1.11 0.89 0.87 0.97  CALCIUM 8.4*  --  7.4* 8.0*  LFT Recent Labs    11/03/20 1310  PROT 6.5  ALBUMIN 3.4*  AST 21  ALT 35  ALKPHOS 79  BILITOT 0.8   PT/INR No results for input(s): LABPROT, INR in the last 72 hours.  STUDIES: CT HEAD WO CONTRAST (5MM)  Result Date: 11/05/2020 CLINICAL DATA:  Transient ischemic attack (TIA) EXAM: CT HEAD WITHOUT CONTRAST TECHNIQUE: Contiguous axial images were obtained from the base of the skull through the vertex without intravenous contrast. COMPARISON:  None. FINDINGS: Brain: There is no acute intracranial hemorrhage, mass effect, or edema. Gray-white differentiation is preserved. There is no extra-axial fluid collection. Ventricles and sulci are within normal limits in size and configuration. Vascular: There is atherosclerotic calcification at the skull base. Skull: Calvarium is unremarkable. Sinuses/Orbits: No acute finding. Other: None. IMPRESSION: No acute intracranial abnormality. Electronically Signed   By: Macy Mis M.D.   On: 11/05/2020 14:20      Impression / Plan:   Assessment: Active Problems:   Sepsis ([place])   [name] Browning is a 80 y.o. y/o male who I am being consult it for a globus sensation with dysphagia.  The patient was seen by speech therapy who reported his symptoms consistent with overtly functional swallowing in the setting of acute sepsis but recommended a GI consult due to a history of stomach surgery highly hernia and reflux with a globus sensation.  The patient reports that this has been going on for many months and at the time of my interview with the patient he had completely finished a Sandwich without difficulty.  Plan:  Due to the patient's dysphagia and recommendation by speech pathology the patient will be set up for a upper endoscopy for tomorrow.  The patient has been explained the plan and agrees with it.  He has been informed of the risks and benefits.   The patient will be kept nothing by mouth after midnight for the EGD for tomorrow.  Thank you for involving me in the care of this patient.      LOS: 2 days   Lucilla Lame, MD, [facility] 11/05/2020, 4:49 PM,  Pager [phone] 7am-5pm  Check AMION for 5pm -7am coverage and on weekends   Note: This dictation was prepared with Dragon dictation along with smaller phrase technology. Any transcriptional errors that result from this process are unintentional.

## 2020-11-05 NOTE — Progress Notes (Signed)
PROGRESS NOTE    [name] Browning  MGN:[id number] DOB: 1940/03/27 DOA: 11/03/2020 PCP: [facility]    Brief Narrative:  [name] Browning is a 79 year old male with past medical history significant for CAD, paroxysmal atrial fibrillation, essential hypertension, hyperlipidemia, recent VRE UTI treated with linezolid who presents to [facility] ED on 10/25 with nausea, diarrhea, and generalized fatigue.  Reports intermittent abdominal cramping and difficulty keeping food down.  Poor appetite.  Generalized fatigue since returning home.  In the ED, temperature 97.7 F, HR 85, RR 15, BP 118/64, SPO2 98% on room air.  Sodium 140, potassium 4.2, chloride 106, CO2 28, glucose 134, BUN 44, creatinine 1.11, lipase 32, AST 21, ALT 35, total bilirubin 0.8.  Lactic acid 2.5.  WBC 22.5, hemoglobin 12.9, platelets 237.  COVID-19 PCR negative.  Influenza A/B PCR negative.  Urinalysis with negative leukocytes, negative nitrite, no bacteria, 11-20 WBCs.  CT abdomen/pelvis with diverticulosis with no evidence of diverticulitis, no acute findings within the abdomen/pelvis.  Urine culture and blood cultures obtained.  Given elevated WBC count, lactic acidosis; patient was started on empiric antibiotics with   Assessment & Plan:   Active Problems:   Sepsis ([place])   Sepsis, ruled out Lactic acidosis Dehydration Patient presenting to [facility] ED with nausea, diarrhea and generalized fatigue.  Recently completed antibiotic course with linezolid for VRE UTI.  Since returning home has been having poor appetite, little oral intake and generalized weakness/fatigue.  Patient is afebrile with elevated WC count of 22.5 and lactic acidosis of 2.5.  CT abdomen/pelvis unrevealing.  Urinalysis negative.  Etiology of his elevated white count and lactic acid likely secondary to dehydration in the setting of poor oral intake and associated nausea/diarrhea. --WBC 22.5>14.2>13.6>12.2 --lactic acid 2.5>1.7 --Blood cultures x 2: no growth x 2  days --Urine Culture: No growth --No further diarrhea while inpatient, will discontinue C. difficile/GI PCR panel --Discontinue enteric precautions --Discontinue metronidazole --IVF hydration w/ NS at 143mL/hr --Follow CBC and lactic acid daily  Dysphagia Patient reports longstanding history of difficulty swallowing solids and liquids, globus sensation with both consistencies.  Has progressively worsened.  Seen by speech therapy with no signs of aspiration. --Gastroenterology consulted for further evaluation, consideration of EGD --check CT head w/o contrast for family concerns of slurred speech --Continue aspiration precautions  Pulmonary nodules Incidental finding of solid pulmonary nodules left lower lobe, largest 7 mm. --Recommend repeat noncontrast CT chest 6/12 months.  Essential hypertension BP 121/71, well controlled.  At baseline on amlodipine 5 mg p.o. daily, furosemide 20 mg p.o. daily, metoprolol succinate 12.5 mg p.o. twice daily. --Continue to hold home and hypertensives --Continue aspirin and statin --Monitor BP closely  COPD At baseline on 2 L nasal cannula.  Also on montelukast, Symbicort. --Continue mometasone-formoterol as hospital substitution for Symbicort --Continue montelukast 10 mg p.o. nightly  BPH: Tamsulosin 0.4 mg p.o. daily  HLD: Atorvastatin 40 mg p.o. daily  GERD: Continue PPI  Iron deficiency anemia: Ferrous gluconate 3 and 24 mg p.o. daily  Paroxysmal atrial fibrillation --Apixaban 5 mg p.o. twice daily --Holding metoprolol succinate as above  Weakness/deconditioning/debility: Seen by PT and OT who recommended home health on discharge. --Continue therapy efforts while inpatient.   DVT prophylaxis: SCDs Start: 11/03/20 1623 apixaban (ELIQUIS) tablet 5 mg    Code Status: Full Code Family Communication: No family present at bedside this morning, updated patient's daughter [name] Browning via telephone this morning  Disposition Plan:  Level of  care: Med-Surg Status is: Inpatient  Remains inpatient  appropriate because: Weakness, fatigue, dehydration requiring IV fluid hydration.  Pending GI evaluation.   Consultants:  Gastroenterology, Dr. Allen Norris  Procedures:  None  Antimicrobials:  Flagyl 10/25 - 10/26 Ceftriaxone 10/25 - 10/25 Zyvox 10/25 - 10/25     Subjective: Patient seen examined bedside, resting comfortably.  Continues with poor appetite.  Continues to report dysphagia with both solids and liquids.  Consulted GI for further recommendations, consideration of EGD.  No family present bedside this morning.  No specific complaints this morning other than continued generalized weakness/fatigue.  Denies further diarrhea.  No other complaints or concerns at this time.  Denies headache, no fever/chills/night sweats, no current nausea/vomiting/diarrhea, no chest pain, palpitations, no shortness of breath, no abdominal pain, no paresthesias.  No acute events overnight per nursing staff.  Objective: Vitals:   11/04/20 2022 11/05/20 0434 11/05/20 0824 11/05/20 1145  BP: (!) 104/58 (!) 92/59 136/77 (!) 108/59  Pulse: 96 95 93 87  Resp: 17 17 18 18   Temp: 97.7 F (36.5 C) 97.8 F (36.6 C) 97.7 F (36.5 C) 97.9 F (36.6 C)  TempSrc:   Oral Oral  SpO2: 96% 97% 98% 99%  Weight:      Height:        Intake/Output Summary (Last 24 hours) at 11/05/2020 1241 Last data filed at 11/05/2020 0800 Gross per 24 hour  Intake --  Output 400 ml  Net -400 ml   Filed Weights   11/03/20 1258  Weight: 81.6 kg    Examination:  General exam: Appears calm and comfortable, chronically ill appearance Respiratory system: Clear to auscultation. Respiratory effort normal.  On room air Cardiovascular system: S1 & S2 heard, RRR. No JVD, murmurs, rubs, gallops or clicks. No pedal edema. Gastrointestinal system: Abdomen is nondistended, soft and nontender. No organomegaly or masses felt. Normal bowel sounds heard. Central nervous system:  Alert and oriented. No focal neurological deficits. Extremities: Symmetric 5 x 5 power. Skin: No rashes, lesions or ulcers Psychiatry: Judgement and insight appear normal. Mood & affect appropriate.     Data Reviewed: I have personally reviewed following labs and imaging studies  CBC: Recent Labs  Lab 10/31/20 0931 11/03/20 1310 11/03/20 1912 11/04/20 0617 11/05/20 0437  WBC 16.6* 22.5* 14.2* 13.6* 12.2*  NEUTROABS  --  21.2*  --   --   --   HGB 12.7* 12.9* 11.1* 10.1* 10.7*  HCT 38.5* 38.9* 32.9* 30.9* 33.1*  MCV 95.5 96.8 95.9 96.6 94.3  PLT 271 237 173 146* 883*   Basic Metabolic Panel: Recent Labs  Lab 10/31/20 0931 11/03/20 1310 11/03/20 1912 11/04/20 0617 11/05/20 0437  NA 138 140  --  136 139  K 3.6 4.2  --  4.1 3.7  CL 94* 106  --  106 107  CO2 35* 28  --  25 25  GLUCOSE 88 134*  --  97 92  BUN 46* 44*  --  32* 28*  CREATININE 1.04 1.11 0.89 0.87 0.97  CALCIUM 8.6* 8.4*  --  7.4* 8.0*  MG  --   --   --   --  1.9   GFR: Estimated Creatinine Clearance: 71.3 mL/min (by C-G formula based on SCr of 0.97 mg/dL). Liver Function Tests: Recent Labs  Lab 11/03/20 1310  AST 21  ALT 35  ALKPHOS 79  BILITOT 0.8  PROT 6.5  ALBUMIN 3.4*   Recent Labs  Lab 11/03/20 1310  LIPASE 32   No results for input(s): AMMONIA in the last 168 hours.  Coagulation Profile: No results for input(s): INR, PROTIME in the last 168 hours. Cardiac Enzymes: No results for input(s): CKTOTAL, CKMB, CKMBINDEX, TROPONINI in the last 168 hours. BNP (last 3 results) No results for input(s): PROBNP in the last 8760 hours. HbA1C: No results for input(s): HGBA1C in the last 72 hours. CBG: No results for input(s): GLUCAP in the last 168 hours. Lipid Profile: No results for input(s): CHOL, HDL, LDLCALC, TRIG, CHOLHDL, LDLDIRECT in the last 72 hours. Thyroid Function Tests: No results for input(s): TSH, T4TOTAL, FREET4, T3FREE, THYROIDAB in the last 72 hours. Anemia Panel: No results  for input(s): VITAMINB12, FOLATE, FERRITIN, TIBC, IRON, RETICCTPCT in the last 72 hours. Sepsis Labs: Recent Labs  Lab 11/03/20 1326 11/03/20 1549 11/05/20 0437  LATICACIDVEN 2.5* 2.3* 1.7    Recent Results (from the past 240 hour(s))  MRSA Next Gen by PCR, Nasal     Status: None   Collection Time: 10/28/20 12:30 PM   Specimen: Nasal Mucosa; Nasal Swab  Result Value Ref Range Status   MRSA by PCR Next Gen NOT DETECTED NOT DETECTED Final    Comment: (NOTE) The GeneXpert MRSA Assay (FDA approved for NASAL specimens only), is one component of a comprehensive MRSA colonization surveillance program. It is not intended to diagnose MRSA infection nor to guide or monitor treatment for MRSA infections. Test performance is not FDA approved in patients less than 19 years old. Performed at [facility], [address] [place], [place] [address]   Blood culture (single)     Status: None (Preliminary result)   Collection Time: 11/03/20  1:26 PM   Specimen: BLOOD  Result Value Ref Range Status   Specimen Description BLOOD RIGHT [facility]  Final   Special Requests   Final    BOTTLES DRAWN AEROBIC AND ANAEROBIC Blood Culture adequate volume   Culture   Final    NO GROWTH 2 DAYS Performed at [facility], [address], [place], [place] [address]    Report Status PENDING  Incomplete  Resp Panel by RT-PCR (Flu A&B, Covid) Nasopharyngeal Swab     Status: None   Collection Time: 11/03/20  1:26 PM   Specimen: Nasopharyngeal Swab; Nasopharyngeal(NP) swabs in vial transport medium  Result Value Ref Range Status   SARS Coronavirus 2 by RT PCR NEGATIVE NEGATIVE Final    Comment: (NOTE) SARS-CoV-2 target nucleic acids are NOT DETECTED.  The SARS-CoV-2 RNA is generally detectable in upper respiratory specimens during the acute phase of infection. The lowest concentration of SARS-CoV-2 viral copies this assay can detect is 138 copies/mL. A negative result does not preclude  SARS-Cov-2 infection and should not be used as the sole basis for treatment or other patient management decisions. A negative result may occur with  improper specimen collection/handling, submission of specimen other than nasopharyngeal swab, presence of viral mutation(s) within the areas targeted by this assay, and inadequate number of viral copies(<138 copies/mL). A negative result must be combined with clinical observations, patient history, and epidemiological information. The expected result is Negative.  Fact Sheet for Patients:  EntrepreneurPulse.com.au  Fact Sheet for Healthcare Providers:  IncredibleEmployment.be  This test is no t yet approved or cleared by the [place] FDA and  has been authorized for detection and/or diagnosis of SARS-CoV-2 by FDA under an Emergency Use Authorization (EUA). This EUA will remain  in effect (meaning this test can be used) for the duration of the COVID-19 declaration under Section 564(b)(1) of the Act, 21 U.S.C.section 360bbb-3(b)(1), unless  the authorization is terminated  or revoked sooner.       Influenza A by PCR NEGATIVE NEGATIVE Final   Influenza B by PCR NEGATIVE NEGATIVE Final    Comment: (NOTE) The Xpert Xpress SARS-CoV-2/FLU/RSV plus assay is intended as an aid in the diagnosis of influenza from Nasopharyngeal swab specimens and should not be used as a sole basis for treatment. Nasal washings and aspirates are unacceptable for Xpert Xpress SARS-CoV-2/FLU/RSV testing.  Fact Sheet for Patients: EntrepreneurPulse.com.au  Fact Sheet for Healthcare Providers: IncredibleEmployment.be  This test is not yet approved or cleared by the [place] FDA and has been authorized for detection and/or diagnosis of SARS-CoV-2 by FDA under an Emergency Use Authorization (EUA). This EUA will remain in effect (meaning this test can be used) for the duration of  the COVID-19 declaration under Section 564(b)(1) of the Act, 21 U.S.C. section 360bbb-3(b)(1), unless the authorization is terminated or revoked.  Performed at [facility], [address] [place], [place] [address]   Blood culture (single)     Status: None (Preliminary result)   Collection Time: 11/03/20  2:30 PM   Specimen: BLOOD  Result Value Ref Range Status   Specimen Description BLOOD LEFT ANTECUBITAL  Final   Special Requests   Final    BOTTLES DRAWN AEROBIC AND ANAEROBIC Blood Culture results may not be optimal due to an excessive volume of blood received in culture bottles   Culture   Final    NO GROWTH 2 DAYS Performed at [facility], [address], [place], [place] [address]    Report Status PENDING  Incomplete  Urine Culture     Status: None   Collection Time: 11/03/20  8:26 PM   Specimen: Urine, Clean Catch  Result Value Ref Range Status   Specimen Description   Final    URINE, CLEAN CATCH Performed at [facility], [address] [place], [place] [address]    Special Requests   Final    NONE Performed at [facility], [address] [place], [place] [address]    Culture   Final    NO GROWTH Performed at [facility], [place] [address] [place], [place] [address]    Report Status 11/04/2020 FINAL  Final         Radiology Studies: CT ABDOMEN PELVIS W CONTRAST  Result Date: 11/03/2020 CLINICAL DATA:  Nausea and vomiting EXAM: CT ABDOMEN AND PELVIS WITH CONTRAST TECHNIQUE: Multidetector CT imaging of the abdomen and pelvis was performed using the standard protocol following bolus administration of intravenous contrast. CONTRAST:  131mL OMNIPAQUE IOHEXOL 300 MG/ML  SOLN COMPARISON:  CT abdomen and pelvis dated October 20, 2020 FINDINGS: Lower chest: Cardiomegaly. Emphysema. Solid pulmonary nodules of the left lower lobe. Largest measures up to 7 mm and is located on series 2, image 98. Hepatobiliary: No focal  liver abnormality is seen. No gallstones, gallbladder wall thickening, or biliary dilatation. Pancreas: Unremarkable. No pancreatic ductal dilatation or surrounding inflammatory changes. Spleen: Normal in size without focal abnormality. Adrenals/Urinary Tract: Bilateral adrenal glands are unremarkable. No hydronephrosis. Bilateral nonobstructing stones, left greater than right. Bladder is unremarkable. Stomach/Bowel: Stomach is within normal limits. Appendix appears normal. Diverticulosis. No evidence of bowel wall thickening, distention, or inflammatory changes. Vascular/Lymphatic: Aortic atherosclerosis. No enlarged abdominal or pelvic lymph nodes. Reproductive: Prostatomegaly, measuring up to 5.8 cm. Other: Small right greater than left fat containing inguinal hernias. Musculoskeletal: Unchanged L4 compression deformity. No aggressive appearing osseous lesions. IMPRESSION: Diverticulosis  with no evidence of diverticulitis. No acute findings abdomen or pelvis. Solid pulmonary nodules of the left lower lobe, largest measures up to 7 mm. Non-contrast chest CT at 6-12 months is recommended. If the nodule is stable at time of repeat CT, then future CT at 18-24 months (from today's scan) is considered optional for low-risk patients, but is recommended for high-risk patients. This recommendation follows the consensus statement: Guidelines for Management of Incidental Pulmonary Nodules Detected on CT Images: From the Fleischner Society 2017; Radiology 2017; 284:228-243. Aortic Atherosclerosis (ICD10-I70.0) and Emphysema (ICD10-J43.9). Electronically Signed   By: Yetta Glassman M.D.   On: 11/03/2020 15:48        Scheduled Meds:  apixaban  5 mg Oral BID   aspirin EC  81 mg Oral QPC supper   atorvastatin  40 mg Oral QPC supper   famotidine  20 mg Oral Daily   ferrous gluconate  324 mg Oral QPC supper   mometasone-formoterol  2 puff Inhalation BID   montelukast  10 mg Oral QHS   pantoprazole  40 mg Oral QPC  supper   tamsulosin  0.4 mg Oral Daily   Continuous Infusions:  sodium chloride 100 mL/hr at 11/05/20 0635     LOS: 2 days    Time spent: 39 minutes spent on chart review, discussion with nursing staff, consultants, updating family and interview/physical exam; more than 50% of that time was spent in counseling and/or coordination of care.    Maurya Nethery J [place] DO [facility] Available via Epic secure chat 7am-7pm After these hours, please refer to coverage provider listed on [email or web address] 11/05/2020, 12:41 PM

## 2020-11-05 NOTE — Plan of Care (Signed)

## 2020-11-06 ENCOUNTER — Encounter: Payer: Self-pay | Admitting: Internal Medicine

## 2020-11-06 ENCOUNTER — Inpatient Hospital Stay: Payer: Medicare HMO | Admitting: Certified Registered Nurse Anesthetist

## 2020-11-06 ENCOUNTER — Encounter
Admission: EM | Disposition: A | Discharge status: Home / Self Care | Payer: Self-pay | Source: Home / Self Care | Attending: Internal Medicine

## 2020-11-06 ENCOUNTER — Ambulatory Visit: Admit: 2020-11-06 | Payer: Medicare HMO | Admitting: Gastroenterology

## 2020-11-06 DIAGNOSIS — I480 Paroxysmal atrial fibrillation: Secondary | ICD-10-CM | POA: Diagnosis not present

## 2020-11-06 DIAGNOSIS — I10 Essential (primary) hypertension: Secondary | ICD-10-CM

## 2020-11-06 DIAGNOSIS — E785 Hyperlipidemia, unspecified: Secondary | ICD-10-CM

## 2020-11-06 DIAGNOSIS — R1319 Other dysphagia: Secondary | ICD-10-CM | POA: Diagnosis not present

## 2020-11-06 DIAGNOSIS — M5030 Other cervical disc degeneration, unspecified cervical region: Secondary | ICD-10-CM

## 2020-11-06 DIAGNOSIS — R1310 Dysphagia, unspecified: Secondary | ICD-10-CM | POA: Diagnosis not present

## 2020-11-06 DIAGNOSIS — J9611 Chronic respiratory failure with hypoxia: Secondary | ICD-10-CM | POA: Diagnosis not present

## 2020-11-06 DIAGNOSIS — F419 Anxiety disorder, unspecified: Secondary | ICD-10-CM | POA: Diagnosis not present

## 2020-11-06 HISTORY — PX: ESOPHAGOGASTRODUODENOSCOPY: SHX5428

## 2020-11-06 LAB — CBC
HCT: 29.5 % — ABNORMAL LOW (ref 39.0–52.0)
Hemoglobin: 9.7 g/dL — ABNORMAL LOW (ref 13.0–17.0)
MCH: 31.6 pg (ref 26.0–34.0)
MCHC: 32.9 g/dL (ref 30.0–36.0)
MCV: 96.1 fL (ref 80.0–100.0)
Platelets: 128 10*3/uL — ABNORMAL LOW (ref 150–400)
RBC: 3.07 MIL/uL — ABNORMAL LOW (ref 4.22–5.81)
RDW: 15.2 % (ref 11.5–15.5)
WBC: 10.2 10*3/uL (ref 4.0–10.5)
nRBC: 0 % (ref 0.0–0.2)

## 2020-11-06 LAB — BASIC METABOLIC PANEL
Anion gap: 3 — ABNORMAL LOW (ref 5–15)
BUN: 21 mg/dL (ref 8–23)
CO2: 26 mmol/L (ref 22–32)
Calcium: 7.5 mg/dL — ABNORMAL LOW (ref 8.9–10.3)
Chloride: 111 mmol/L (ref 98–111)
Creatinine, Ser: 0.81 mg/dL (ref 0.61–1.24)
GFR, Estimated: >60 mL/min (ref >60)
Glucose, Bld: 106 mg/dL — ABNORMAL HIGH (ref 70–99)
Potassium: 3.4 mmol/L — ABNORMAL LOW (ref 3.5–5.1)
Sodium: 140 mmol/L (ref 135–145)

## 2020-11-06 LAB — MAGNESIUM: Magnesium: 1.7 mg/dL (ref 1.7–2.4)

## 2020-11-06 SURGERY — ESOPHAGOGASTRODUODENOSCOPY (EGD) WITH PROPOFOL
Anesthesia: General

## 2020-11-06 SURGERY — EGD (ESOPHAGOGASTRODUODENOSCOPY)
Anesthesia: General

## 2020-11-06 MED ORDER — MAGNESIUM SULFATE 2 GM/50ML IV SOLN
2.0000 g | Freq: Once | INTRAVENOUS | Status: AC
  Administered 2020-11-06: 2 g via INTRAVENOUS
  Filled 2020-11-06: qty 50

## 2020-11-06 MED ORDER — PROPOFOL 500 MG/50ML IV EMUL
INTRAVENOUS | Status: DC | PRN
  Administered 2020-11-06: 100 ug/kg/min via INTRAVENOUS

## 2020-11-06 MED ORDER — PHENYLEPHRINE HCL (PRESSORS) 10 MG/ML IV SOLN
INTRAVENOUS | Status: DC | PRN
  Administered 2020-11-06 (×3): 200 ug via INTRAVENOUS

## 2020-11-06 MED ORDER — PROPOFOL 10 MG/ML IV BOLUS
INTRAVENOUS | Status: DC | PRN
  Administered 2020-11-06: 20 mg via INTRAVENOUS

## 2020-11-06 MED ORDER — LIDOCAINE HCL (CARDIAC) PF 100 MG/5ML IV SOSY
PREFILLED_SYRINGE | INTRAVENOUS | Status: DC | PRN
  Administered 2020-11-06: 25 mg via INTRAVENOUS

## 2020-11-06 NOTE — Anesthesia Preprocedure Evaluation (Signed)
Anesthesia Evaluation  Patient identified by MRN, date of birth, ID band Patient awake    Reviewed: Allergy & Precautions, NPO status , Patient's Chart, lab work & pertinent test results  History of Anesthesia Complications Negative for: history of anesthetic complications  Airway Mallampati: I   Neck ROM: Full    Dental  (+) Edentulous Upper, Edentulous Lower   Pulmonary COPD (on 2L continuously), former smoker (quit 2005),     + decreased breath sounds      Cardiovascular hypertension, + CAD  Normal cardiovascular exam+ dysrhythmias (a fib on Eliquis)  Rhythm:Regular Rate:Normal  Hx DVT  ECG 11/03/20:  Atrial fibrillation Right axis deviation Low voltage, precordial leads Nonspecific T abnormalities, lateral leads   Neuro/Psych PSYCHIATRIC DISORDERS Anxiety TIA   GI/Hepatic GERD  ,  Endo/Other  negative endocrine ROS  Renal/GU Renal disease (nephrolithiasis)     Musculoskeletal  (+) Arthritis ,   Abdominal   Peds  Hematology negative hematology ROS (+)   Anesthesia Other Findings   Reproductive/Obstetrics                            Anesthesia Physical Anesthesia Plan  ASA: 3  Anesthesia Plan: General   Post-op Pain Management:    Induction: Intravenous  PONV Risk Score and Plan: 2 and Propofol infusion, TIVA and Treatment may vary due to age or medical condition  Airway Management Planned: Natural Airway  Additional Equipment:   Intra-op Plan:   Post-operative Plan:   Informed Consent: I have reviewed the patients History and Physical, chart, labs and discussed the procedure including the risks, benefits and alternatives for the proposed anesthesia with the patient or authorized representative who has indicated his/her understanding and acceptance.       Plan Discussed with: CRNA  Anesthesia Plan Comments:         Anesthesia Quick Evaluation

## 2020-11-06 NOTE — Discharge Summary (Signed)
Physician Discharge Summary  [name] Browning DPO:[id number] DOB: 11-20-1940 DOA: 11/03/2020  PCP: [facility]  Admit date: 11/03/2020 Discharge date: 11/06/2020  Admitted From: Home Disposition: Home  Recommendations for Outpatient Follow-up:  Follow up with PCP in 1-2 weeks Discontinued amlodipine, metoprolol succinate, isosorbide mononitrate, furosemide due to borderline hypotension Please obtain BMP/CBC in one week Incidental finding of pulmonary nodules on CT scan, recommend CT chest follow-up in 6/12 months if appropriate Consider further goals of care discussions outpatient with PCP given his comorbidities, advanced age and high risk for mortality over the next 12 months  Home Health: Patient declined any home health services Equipment/Devices: Continues on his home oxygen 2 L per nasal cannula  Discharge Condition: Stable CODE STATUS: Full code Diet recommendation: Heart healthy diet, with regular solids and thin liquids with aspiration precautions  History of present illness:  [name] Browning is a 80 year old male with past medical history significant for CAD, paroxysmal atrial fibrillation, essential hypertension, hyperlipidemia, recent VRE UTI treated with linezolid who presents to [facility] on 10/25 with nausea, diarrhea, and generalized fatigue.  Reports intermittent abdominal cramping and difficulty keeping food down.  Poor appetite.  Generalized fatigue since returning home.   In the ED, temperature 97.7 F, HR 85, RR 15, BP 118/64, SPO2 98% on room air.  Sodium 140, potassium 4.2, chloride 106, CO2 28, glucose 134, BUN 44, creatinine 1.11, lipase 32, AST 21, ALT 35, total bilirubin 0.8.  Lactic acid 2.5.  WBC 22.5, hemoglobin 12.9, platelets 237.  COVID-19 PCR negative.  Influenza A/B PCR negative.  Urinalysis with negative leukocytes, negative nitrite, no bacteria, 11-20 WBCs.  CT abdomen/pelvis with diverticulosis with no evidence of diverticulitis, no acute findings  within the abdomen/pelvis.  Urine culture and blood cultures obtained.  Given elevated WBC count, lactic acidosis; patient was started on empiric antibiotics.  Hospitalist service consulted for further evaluation management.  Hospital course:  Sepsis, ruled out Lactic acidosis 2/2 dehydration Patient presenting to [facility] ED with nausea, diarrhea and generalized fatigue.  Recently completed antibiotic course with linezolid for VRE UTI.  Since returning home has been having poor appetite, little oral intake and generalized weakness/fatigue.  Patient is afebrile with elevated WC count of 22.5 and lactic acidosis of 2.5.  CT abdomen/pelvis unrevealing.  Urinalysis negative.  Etiology of his elevated white count and lactic acid likely secondary to dehydration in the setting of poor oral intake and associated nausea/diarrhea.  Initially started on empiric antibiotics which were discontinued as etiology likely secondary to severe dehydration.  WBC count improved to 10.2 at time of discharge with normalization of lactic acid.  Blood cultures showed no growth during hospitalization urine culture with no growth.  No further diarrhea.  Was improved with IV fluid hydration.  Encourage increased oral intake outpatient.   Dysphagia Patient reports longstanding history of difficulty swallowing solids and liquids, globus sensation with both consistencies.  Has progressively worsened.  Seen by speech therapy with no signs of aspiration.  Gastroenterology was consulted and underwent EGD with no significant findings.  Continue regular solids and thin liquids with aspiration precautions.   Pulmonary nodules Incidental finding of solid pulmonary nodules left lower lobe, largest 7 mm. Recommend repeat noncontrast CT chest 6/12 months.   Essential hypertension BP 121/71, well controlled.  At baseline on amlodipine 5 mg p.o. daily, furosemide 20 mg p.o. daily, metoprolol succinate 12.5 mg p.o. twice daily.  His home  epidurograms and regimens were held during hospitalization given his borderline hypotension.  We will discontinue amlodipine, metoprolol, furosemide, and isosorbide mononitrate.  Continue aspirin and statin.  Recommend monitoring blood pressure daily on discharge and to follow-up with PCP for further guidance.   COPD At baseline on 2 L nasal cannula.  Continue home montelukast, Symbicort.   BPH: Tamsulosin 0.4 mg p.o. daily   HLD: Atorvastatin 40 mg p.o. daily   GERD: Continue PPI   Iron deficiency anemia: Ferrous gluconate 3 and 24 mg p.o. daily   Paroxysmal atrial fibrillation Apixaban 5 mg p.o. twice daily, metoprolol succinate discontinued for borderline hypotension.   Weakness/deconditioning/debility: Seen by PT and OT who recommended home health on discharge; but patient and family declined.   Discharge Diagnoses:  Active Problems:   HTN (hypertension)   Hyperlipidemia   Degenerative disc disease, cervical   AF (paroxysmal atrial fibrillation) (HCC)   Chronic respiratory failure with hypoxia [facility])   Dysphagia    Discharge Instructions  Discharge Instructions     Call MD for:  difficulty breathing, headache or visual disturbances   Complete by: As directed    Call MD for:  extreme fatigue   Complete by: As directed    Call MD for:  persistant dizziness or light-headedness   Complete by: As directed    Call MD for:  persistant nausea and vomiting   Complete by: As directed    Call MD for:  severe uncontrolled pain   Complete by: As directed    Call MD for:  temperature >100.4   Complete by: As directed    Diet - low sodium heart healthy   Complete by: As directed    Increase activity slowly   Complete by: As directed       Allergies as of 11/06/2020       Reactions   Hydromorphone    Hallucination Pt reports he doesn't know about this        Medication List     STOP taking these medications    amLODipine 5 MG tablet Commonly known as:  NORVASC   furosemide 20 MG tablet Commonly known as: Lasix   isosorbide mononitrate 30 MG 24 hr tablet Commonly known as: IMDUR   linezolid 600 MG tablet Commonly known as: ZYVOX   metoprolol succinate 25 MG 24 hr tablet Commonly known as: TOPROL-XL   predniSONE 10 MG tablet Commonly known as: DELTASONE       TAKE these medications    acetaminophen 500 MG tablet Commonly known as: TYLENOL Take 2 tablets (1,000 mg total) by mouth every 6 (six) hours as needed for mild pain.   apixaban 5 MG Tabs tablet Commonly known as: ELIQUIS Take 1 tablet (5 mg total) by mouth 2 (two) times daily.   aspirin EC 81 MG tablet Take 81 mg by mouth daily after supper.   atorvastatin 40 MG tablet Commonly known as: LIPITOR Take 40 mg by mouth daily after supper.   cetirizine 10 MG tablet Commonly known as: ZYRTEC Take 10 mg by mouth daily.   famotidine 20 MG tablet Commonly known as: Pepcid One after supper   guaiFENesin-dextromethorphan 100-10 MG/5ML syrup Commonly known as: ROBITUSSIN DM Take 5 mLs by mouth every 4 (four) hours as needed for cough.   Iron 240 (27 Fe) MG Tabs Take 27 mg by mouth daily after supper.   montelukast 10 MG tablet Commonly known as: SINGULAIR Take 10 mg by mouth at bedtime.   pantoprazole 40 MG tablet Commonly known as: PROTONIX Take 40 mg by mouth daily after  supper.   phenazopyridine 200 MG tablet Commonly known as: PYRIDIUM Take 1 tablet (200 mg total) by mouth 3 (three) times daily as needed (bladder pain).   polyethylene glycol 17 g packet Commonly known as: MIRALAX / GLYCOLAX Take 17 g by mouth daily as needed for mild constipation or moderate constipation. What changed: when to take this   Symbicort 160-4.5 MCG/ACT inhaler Generic drug: budesonide-formoterol Inhale 2 puffs into the lungs 2 (two) times daily as needed (shortness of breath).   tamsulosin 0.4 MG Caps capsule Commonly known as: FLOMAX Take 1 capsule (0.4 mg total)  by mouth daily.               Durable Medical Equipment  (From admission, onward)           Start     Ordered   11/05/20 1626  For home use only DME 3 n 1  Once        11/05/20 1626   11/05/20 1625  For home use only DME Walker rolling  Once       Question Answer Comment  Walker: With 5 Inch Wheels   Patient needs a walker to treat with the following condition Impaired ambulation      11/05/20 [place] Schedule an appointment as soon as possible for a visit in 1 week(s).   Contact information: [place] [phone]                Allergies  Allergen Reactions   Hydromorphone     Hallucination Pt reports he doesn't know about this    Consultations: Gastroenterology, Dr. Allen Norris   Procedures/Studies: DG Abdomen 1 View  Result Date: 10/20/2020 CLINICAL DATA:  Sepsis.  Evaluate for stent placement. EXAM: ABDOMEN - 1 VIEW COMPARISON:  Fluoroscopy 10/16/2020 FINDINGS: A left ureteral stent is present. The proximal pigtail projects to the left of L3. This is lower than on the previous fluoroscopic image. The distal pigtail projects over the perineum below the level of the symphysis pubis. This suggests inferior migration of the stent with the distal pigtail probably either in the urethra or possibly in a prolapsed bladder. Scattered gas and stool throughout the colon and small bowel. No small or large bowel distention. No radiopaque stones are identified. Degenerative changes in the spine and hips. IMPRESSION: Left ureteral stent is present. The stent appears to have migrated inferiorly with the proximal pigtail lower than on prior study and the distal pigtail projecting below the level of the symphysis pubis, possibly in the urethra or in a prolapsed bladder. Electronically Signed   By: Lucienne Capers M.D.   On: 10/20/2020 20:18   CT HEAD WO CONTRAST (5MM)  Result Date:  11/05/2020 CLINICAL DATA:  Transient ischemic attack (TIA) EXAM: CT HEAD WITHOUT CONTRAST TECHNIQUE: Contiguous axial images were obtained from the base of the skull through the vertex without intravenous contrast. COMPARISON:  None. FINDINGS: Brain: There is no acute intracranial hemorrhage, mass effect, or edema. Gray-white differentiation is preserved. There is no extra-axial fluid collection. Ventricles and sulci are within normal limits in size and configuration. Vascular: There is atherosclerotic calcification at the skull base. Skull: Calvarium is unremarkable. Sinuses/Orbits: No acute finding. Other: None. IMPRESSION: No acute intracranial abnormality. Electronically Signed   By: Macy Mis M.D.   On: 11/05/2020 14:20   CT ABDOMEN PELVIS W CONTRAST  Result Date: 11/03/2020 CLINICAL DATA:  Nausea and vomiting EXAM: CT ABDOMEN AND PELVIS WITH CONTRAST TECHNIQUE: Multidetector CT imaging of the abdomen and pelvis was performed using the standard protocol following bolus administration of intravenous contrast. CONTRAST:  168mL OMNIPAQUE IOHEXOL 300 MG/ML  SOLN COMPARISON:  CT abdomen and pelvis dated October 20, 2020 FINDINGS: Lower chest: Cardiomegaly. Emphysema. Solid pulmonary nodules of the left lower lobe. Largest measures up to 7 mm and is located on series 2, image 98. Hepatobiliary: No focal liver abnormality is seen. No gallstones, gallbladder wall thickening, or biliary dilatation. Pancreas: Unremarkable. No pancreatic ductal dilatation or surrounding inflammatory changes. Spleen: Normal in size without focal abnormality. Adrenals/Urinary Tract: Bilateral adrenal glands are unremarkable. No hydronephrosis. Bilateral nonobstructing stones, left greater than right. Bladder is unremarkable. Stomach/Bowel: Stomach is within normal limits. Appendix appears normal. Diverticulosis. No evidence of bowel wall thickening, distention, or inflammatory changes. Vascular/Lymphatic: Aortic atherosclerosis.  No enlarged abdominal or pelvic lymph nodes. Reproductive: Prostatomegaly, measuring up to 5.8 cm. Other: Small right greater than left fat containing inguinal hernias. Musculoskeletal: Unchanged L4 compression deformity. No aggressive appearing osseous lesions. IMPRESSION: Diverticulosis with no evidence of diverticulitis. No acute findings abdomen or pelvis. Solid pulmonary nodules of the left lower lobe, largest measures up to 7 mm. Non-contrast chest CT at 6-12 months is recommended. If the nodule is stable at time of repeat CT, then future CT at 18-24 months (from today's scan) is considered optional for low-risk patients, but is recommended for high-risk patients. This recommendation follows the consensus statement: Guidelines for Management of Incidental Pulmonary Nodules Detected on CT Images: From the Fleischner Society 2017; Radiology 2017; 284:228-243. Aortic Atherosclerosis (ICD10-I70.0) and Emphysema (ICD10-J43.9). Electronically Signed   By: Yetta Glassman M.D.   On: 11/03/2020 15:48   DG Chest Port 1 View  Result Date: 10/29/2020 CLINICAL DATA:  Shortness of breath EXAM: PORTABLE CHEST 1 VIEW COMPARISON:  10/27/2020, 10/23/2020, 08/27/2017,, CT 09/19/2018 FINDINGS: Emphysematous disease. Mild reticular opacity at the bases likely due to fibrosis. Slight decreased interstitial opacity since 10/27/2020. No new confluent airspace disease, pleural effusion or pneumothorax. Apical pleural thickening. Stable cardiomediastinal silhouette with aortic atherosclerosis. IMPRESSION: 1. Underlying chronic lung disease/emphysema with overall decreased interstitial opacity since 10/27/2020 suggesting resolving superimposed edema or inflammatory process 2. No new confluent airspace disease Electronically Signed   By: Donavan Foil M.D.   On: 10/29/2020 15:39   DG Chest Port 1 View  Result Date: 10/27/2020 CLINICAL DATA:  Chronic hypoxemia, COPD presents with weakness and shortness of breath EXAM: PORTABLE  CHEST 1 VIEW COMPARISON:  Chest radiograph 10/23/2020 FINDINGS: The cardiomediastinal silhouette is stable. There are diffusely coarsened interstitial markings throughout both lungs, overall not significantly changed since 10/23/2020. There is no new or worsening focal airspace disease. There is no pleural effusion. There is no pneumothorax. There is no acute osseous abnormality. IMPRESSION: Unchanged coarsened interstitial markings throughout both lungs since 10/23/2020 though these are increased since 10/06/2020. Findings may reflect edema or infection superimposed on chronic changes. Electronically Signed   By: Valetta Mole M.D.   On: 10/27/2020 09:05   DG Chest Port 1 View  Result Date: 10/23/2020 CLINICAL DATA:  Respiratory failure, increasing shortness of breath EXAM: PORTABLE CHEST 1 VIEW COMPARISON:  10/20/2020 FINDINGS: Unchanged mildly enlarged cardiac contour. Redemonstrated diffuse airspace and interstitial opacities bilaterally, which appear largely unchanged compared to the prior exam. No definite pleural effusion. Aortic calcifications. No acute osseous abnormality. IMPRESSION: Unchanged bilateral pulmonary opacities, which could represent edema or infection.  Electronically Signed   By: Merilyn Baba M.D.   On: 10/23/2020 17:49   DG Chest Port 1 View  Result Date: 10/20/2020 CLINICAL DATA:  Questionable sepsis.  Evaluate for abnormality. EXAM: PORTABLE CHEST 1 VIEW COMPARISON:  10/06/2020 FINDINGS: Mild cardiac enlargement. Diffuse airspace and interstitial infiltration throughout both lungs, progressing since prior study. This may represent edema or multifocal pneumonia. No pleural effusions. No pneumothorax. Mediastinal contours appear intact. Calcification of the aorta. Degenerative changes in the spine and shoulders. IMPRESSION: Increasing bilateral pulmonary infiltrates, likely edema or pneumonia. Electronically Signed   By: Lucienne Capers M.D.   On: 10/20/2020 19:08   DG OR UROLOGY  CYSTO IMAGE ([facility] ONLY)  Result Date: 10/16/2020 There is no interpretation for this exam.  This order is for images obtained during a surgical procedure.  Please See "Surgeries" Tab for more information regarding the procedure.   ECHOCARDIOGRAM COMPLETE  Result Date: 10/23/2020    ECHOCARDIOGRAM REPORT   Patient Name:   [name] Browning Date of Exam: 10/23/2020 Medical Rec #:  [id number]     Height:       74.0 in Accession #:    [id number]    Weight:       180.0 lb Date of Birth:  October 21, 1940     BSA:          2.078 m Patient Age:    51 years      BP:           133/80 mmHg Patient Gender: M             HR:           95 bpm. Exam Location:  [facility] Procedure: 2D Echo, Cardiac Doppler and Color Doppler Indications:     CHF-acute diastolic R42.70  History:         Patient has prior history of Echocardiogram examinations, most                  recent 04/20/2015. Angina, COPD and TIA; Signs/Symptoms:Syncope.  Sonographer:     Sherrie Sport Referring Phys:  [id number] [place] Diagnosing Phys: Ida Rogue MD  Sonographer Comments: Suboptimal apical window. IMPRESSIONS  1. Left ventricular ejection fraction, by estimation, is 60 to 65%. The left ventricle has normal function. The left ventricle has no regional wall motion abnormalities. Left ventricular diastolic parameters are indeterminate.  2. Right ventricular systolic function is normal. The right ventricular size is normal. There is mildly elevated pulmonary artery systolic pressure.  3. Rhythm is atrial fibrillation  4. Left atrial size was moderately dilated. FINDINGS  Left Ventricle: Left ventricular ejection fraction, by estimation, is 60 to 65%. The left ventricle has normal function. The left ventricle has no regional wall motion abnormalities. The left ventricular internal cavity size was normal in size. There is  no left ventricular hypertrophy. Left ventricular diastolic parameters are indeterminate. Right Ventricle: The right ventricular size is normal. No  increase in right ventricular wall thickness. Right ventricular systolic function is normal. There is mildly elevated pulmonary artery systolic pressure. The tricuspid regurgitant velocity is 2.95  m/s, and with an assumed right atrial pressure of 5 mmHg, the estimated right ventricular systolic pressure is 31.5 mmHg. Left Atrium: Left atrial size was moderately dilated. Right Atrium: Right atrial size was normal in size. Pericardium: There is no evidence of pericardial effusion. Mitral Valve: The mitral valve is normal in structure. There is mild thickening of the mitral valve leaflet(s). No evidence of mitral  valve regurgitation. No evidence of mitral valve stenosis. Tricuspid Valve: The tricuspid valve is normal in structure. Tricuspid valve regurgitation is mild . No evidence of tricuspid stenosis. Aortic Valve: The aortic valve is normal in structure. Aortic valve regurgitation is not visualized. No aortic stenosis is present. Aortic valve mean gradient measures 2.0 mmHg. Aortic valve peak gradient measures 4.7 mmHg. Aortic valve area, by VTI measures 3.04 cm. Pulmonic Valve: The pulmonic valve was normal in structure. Pulmonic valve regurgitation is not visualized. No evidence of pulmonic stenosis. Aorta: The aortic root is normal in size and structure. Venous: The inferior vena cava is normal in size with greater than 50% respiratory variability, suggesting right atrial pressure of 3 mmHg. IAS/Shunts: No atrial level shunt detected by color flow Doppler.  LEFT VENTRICLE PLAX 2D LVIDd:         4.19 cm LVIDs:         2.93 cm LV PW:         1.33 cm LV IVS:        0.95 cm LVOT diam:     2.00 cm LV SV:         55 LV SV Index:   26 LVOT Area:     3.14 cm  RIGHT VENTRICLE RV Basal diam:  4.55 cm RV S prime:     7.51 cm/s TAPSE (M-mode): 3.8 cm LEFT ATRIUM              Index        RIGHT ATRIUM           Index LA diam:        3.50 cm  1.68 cm/m   RA Area:     26.60 cm LA Vol (A2C):   93.4 ml  44.94 ml/m  RA  Volume:   96.30 ml  46.34 ml/m LA Vol (A4C):   128.0 ml 61.59 ml/m LA Biplane Vol: 113.0 ml 54.37 ml/m  AORTIC VALVE                    PULMONIC VALVE AV Area (Vmax):    2.83 cm     PV Vmax:        0.96 m/s AV Area (Vmean):   2.84 cm     PV Peak grad:   3.6 mmHg AV Area (VTI):     3.04 cm     RVOT Peak grad: 4 mmHg AV Vmax:           108.00 cm/s AV Vmean:          70.100 cm/s AV VTI:            0.181 m AV Peak Grad:      4.7 mmHg AV Mean Grad:      2.0 mmHg LVOT Vmax:         97.30 cm/s LVOT Vmean:        63.300 cm/s LVOT VTI:          0.175 m LVOT/AV VTI ratio: 0.97  AORTA Ao Root diam: 3.30 cm MITRAL VALVE                TRICUSPID VALVE MV Area (PHT): 4.52 cm     TR Peak grad:   34.8 mmHg MV Decel Time: 168 msec     TR Vmax:        295.00 cm/s MV E velocity: 134.00 cm/s  SHUNTS                             Systemic VTI:  0.18 m                             Systemic Diam: 2.00 cm Ida Rogue MD Electronically signed by Ida Rogue MD Signature Date/Time: 10/23/2020/5:30:24 PM    Final    CT Renal Stone Study  Result Date: 10/20/2020 CLINICAL DATA:  Intermittent weakness, feeling as if he would faint. EXAM: CT ABDOMEN AND PELVIS WITHOUT CONTRAST TECHNIQUE: Multidetector CT imaging of the abdomen and pelvis was performed following the standard protocol without IV contrast. COMPARISON:  September 18, 2020 FINDINGS: Lower chest: Moderate severity chronic appearing fibrotic changes are seen involving the bilateral lung bases. Small bilateral pleural effusions are noted. Hepatobiliary: No focal liver abnormality is seen. No gallstones, gallbladder wall thickening, or biliary dilatation. Pancreas: Unremarkable. No pancreatic ductal dilatation or surrounding inflammatory changes. Spleen: A punctate calcified granuloma is seen within an otherwise normal-appearing spleen. Adrenals/Urinary Tract: Adrenal glands are unremarkable. Kidneys are normal in size, without focal lesions. A 2  mm nonobstructing renal stone is seen within the upper pole of the right kidney. Clusters of subcentimeter nonobstructing renal stones are seen within the mid and lower left kidney. A left-sided endo ureteral stent is in place. The proximal portion of the stent is seen within the proximal left ureter, just beyond the left UPJ. The distal end extends into the urinary bladder, continues through the prostate gland in serpentine fashion to the proximal to mid portion of the urethra (axial CT image 97, CT series 2). The urinary bladder is empty and subsequently limited in evaluation. Stomach/Bowel: Stomach is within normal limits. Appendix appears normal. No evidence of bowel dilatation. Numerous diverticula are seen within the descending and sigmoid colon. Vascular/Lymphatic: Aortic atherosclerosis. No enlarged abdominal or pelvic lymph nodes. Reproductive: The prostate gland is moderately enlarged. A moderate amount of prostate gland calcification is seen. Other: A 4.4 cm x 2.7 cm fat containing right inguinal hernia is seen. No abdominopelvic ascites. Musculoskeletal: Multilevel degenerative changes seen throughout the lumbar spine. IMPRESSION: 1. Left-sided endo ureteral stent which may be malpositioned, as described above. Confirmation of the desired positioning is recommended. 2. Bilateral nonobstructing renal stones. 3. Small bilateral pleural effusions. 4. Colonic diverticulosis. Aortic Atherosclerosis (ICD10-I70.0). Electronically Signed   By: Virgina Norfolk M.D.   On: 10/20/2020 21:54     Subjective: Patient seen examined at bedside, resting comfortably.  Just returned from EGD.  No significant findings per GI.  Ready for discharge home.  Updated patient's daughter.  No other questions or concerns at this time.  Denies headache, no chest pain, no shortness of breath, no abdominal pain.  No acute events overnight per nurse staff.  Discharge Exam: Vitals:   11/06/20 1352 11/06/20 1402  BP: 116/68 (!)  102/47  Pulse: 80 70  Resp: 13 (!) 21  Temp:    SpO2: 99% 100%   Vitals:   11/06/20 1337 11/06/20 1342 11/06/20 1352 11/06/20 1402  BP: 91/64 (!) 92/59 116/68 (!) 102/47  Pulse: 67 73 80 70  Resp: (!) 21 14 13  (!) 21  Temp:      TempSrc:      SpO2: 100% 100% 99% 100%  Weight:      Height:        General: Pt is  alert, awake, not in acute distress Cardiovascular: RRR, S1/S2 +, no rubs, no gallops Respiratory: CTA bilaterally, no wheezing, no rhonchi Abdominal: Soft, NT, ND, bowel sounds + Extremities: no edema, no cyanosis    The results of significant diagnostics from this hospitalization (including imaging, microbiology, ancillary and laboratory) are listed below for reference.     Microbiology: Recent Results (from the past 240 hour(s))  MRSA Next Gen by PCR, Nasal     Status: None   Collection Time: 10/28/20 12:30 PM   Specimen: Nasal Mucosa; Nasal Swab  Result Value Ref Range Status   MRSA by PCR Next Gen NOT DETECTED NOT DETECTED Final    Comment: (NOTE) The GeneXpert MRSA Assay (FDA approved for NASAL specimens only), is one component of a comprehensive MRSA colonization surveillance program. It is not intended to diagnose MRSA infection nor to guide or monitor treatment for MRSA infections. Test performance is not FDA approved in patients less than 48 years old. Performed at [facility], [address] [place], [place] [address]   Blood culture (single)     Status: None (Preliminary result)   Collection Time: 11/03/20  1:26 PM   Specimen: BLOOD  Result Value Ref Range Status   Specimen Description BLOOD RIGHT [facility]  Final   Special Requests   Final    BOTTLES DRAWN AEROBIC AND ANAEROBIC Blood Culture adequate volume   Culture   Final    NO GROWTH 3 DAYS Performed at [facility], [address], [place], [place] [address]    Report Status PENDING  Incomplete  Resp Panel by RT-PCR (Flu A&B, Covid) Nasopharyngeal Swab     Status: None    Collection Time: 11/03/20  1:26 PM   Specimen: Nasopharyngeal Swab; Nasopharyngeal(NP) swabs in vial transport medium  Result Value Ref Range Status   SARS Coronavirus 2 by RT PCR NEGATIVE NEGATIVE Final    Comment: (NOTE) SARS-CoV-2 target nucleic acids are NOT DETECTED.  The SARS-CoV-2 RNA is generally detectable in upper respiratory specimens during the acute phase of infection. The lowest concentration of SARS-CoV-2 viral copies this assay can detect is 138 copies/mL. A negative result does not preclude SARS-Cov-2 infection and should not be used as the sole basis for treatment or other patient management decisions. A negative result may occur with  improper specimen collection/handling, submission of specimen other than nasopharyngeal swab, presence of viral mutation(s) within the areas targeted by this assay, and inadequate number of viral copies(<138 copies/mL). A negative result must be combined with clinical observations, patient history, and epidemiological information. The expected result is Negative.  Fact Sheet for Patients:  EntrepreneurPulse.com.au  Fact Sheet for Healthcare Providers:  IncredibleEmployment.be  This test is no t yet approved or cleared by the [place] FDA and  has been authorized for detection and/or diagnosis of SARS-CoV-2 by FDA under an Emergency Use Authorization (EUA). This EUA will remain  in effect (meaning this test can be used) for the duration of the COVID-19 declaration under Section 564(b)(1) of the Act, 21 U.S.C.section 360bbb-3(b)(1), unless the authorization is terminated  or revoked sooner.       Influenza A by PCR NEGATIVE NEGATIVE Final   Influenza B by PCR NEGATIVE NEGATIVE Final    Comment: (NOTE) The Xpert Xpress SARS-CoV-2/FLU/RSV plus assay is intended as an aid in the diagnosis of influenza from Nasopharyngeal swab specimens and should not be used as a sole basis for treatment.  Nasal washings and aspirates are unacceptable for Xpert Xpress  SARS-CoV-2/FLU/RSV testing.  Fact Sheet for Patients: EntrepreneurPulse.com.au  Fact Sheet for Healthcare Providers: IncredibleEmployment.be  This test is not yet approved or cleared by the [place] FDA and has been authorized for detection and/or diagnosis of SARS-CoV-2 by FDA under an Emergency Use Authorization (EUA). This EUA will remain in effect (meaning this test can be used) for the duration of the COVID-19 declaration under Section 564(b)(1) of the Act, 21 U.S.C. section 360bbb-3(b)(1), unless the authorization is terminated or revoked.  Performed at [facility], [address] [place], [place] [address]   Blood culture (single)     Status: None (Preliminary result)   Collection Time: 11/03/20  2:30 PM   Specimen: BLOOD  Result Value Ref Range Status   Specimen Description BLOOD LEFT ANTECUBITAL  Final   Special Requests   Final    BOTTLES DRAWN AEROBIC AND ANAEROBIC Blood Culture results may not be optimal due to an excessive volume of blood received in culture bottles   Culture   Final    NO GROWTH 3 DAYS Performed at [facility], [address] [place], [place] [address]    Report Status PENDING  Incomplete  Urine Culture     Status: None   Collection Time: 11/03/20  8:26 PM   Specimen: Urine, Clean Catch  Result Value Ref Range Status   Specimen Description   Final    URINE, CLEAN CATCH Performed at [facility], [address], [place], [place] [address]    Special Requests   Final    NONE Performed at [facility], [address] [place], [place] [address]    Culture   Final    NO GROWTH Performed at [facility], [place] [address] [place], [place] [address]    Report Status 11/04/2020 FINAL  Final     Labs: BNP (last 3 results) Recent Labs    10/06/20 1113 10/20/20 1733 10/22/20 0624  BNP  176.1* 230.9* 785.8*   Basic Metabolic Panel: Recent Labs  Lab 10/31/20 0931 11/03/20 1310 11/03/20 1912 11/04/20 0617 11/05/20 0437 11/06/20 0523  NA 138 140  --  136 139 140  K 3.6 4.2  --  4.1 3.7 3.4*  CL 94* 106  --  106 107 111  CO2 35* 28  --  25 25 26   GLUCOSE 88 134*  --  97 92 106*  BUN 46* 44*  --  32* 28* 21  CREATININE 1.04 1.11 0.89 0.87 0.97 0.81  CALCIUM 8.6* 8.4*  --  7.4* 8.0* 7.5*  MG  --   --   --   --  1.9 1.7   Liver Function Tests: Recent Labs  Lab 11/03/20 1310  AST 21  ALT 35  ALKPHOS 79  BILITOT 0.8  PROT 6.5  ALBUMIN 3.4*   Recent Labs  Lab 11/03/20 1310  LIPASE 32   No results for input(s): AMMONIA in the last 168 hours. CBC: Recent Labs  Lab 11/03/20 1310 11/03/20 1912 11/04/20 0617 11/05/20 0437 11/06/20 0523  WBC 22.5* 14.2* 13.6* 12.2* 10.2  NEUTROABS 21.2*  --   --   --   --   HGB 12.9* 11.1* 10.1* 10.7* 9.7*  HCT 38.9* 32.9* 30.9* 33.1* 29.5*  MCV 96.8 95.9 96.6 94.3 96.1  PLT 237 173 146* 138* 128*   Cardiac Enzymes: No results for input(s): CKTOTAL, CKMB, CKMBINDEX, TROPONINI in the last 168 hours. BNP: Invalid input(s): POCBNP CBG: No results for input(s): GLUCAP in  the last 168 hours. D-Dimer No results for input(s): DDIMER in the last 72 hours. Hgb A1c No results for input(s): HGBA1C in the last 72 hours. Lipid Profile No results for input(s): CHOL, HDL, LDLCALC, TRIG, CHOLHDL, LDLDIRECT in the last 72 hours. Thyroid function studies No results for input(s): TSH, T4TOTAL, T3FREE, THYROIDAB in the last 72 hours.  Invalid input(s): FREET3 Anemia work up No results for input(s): VITAMINB12, FOLATE, FERRITIN, TIBC, IRON, RETICCTPCT in the last 72 hours. Urinalysis    Component Value Date/Time   COLORURINE YELLOW (A) 11/03/2020 2026   APPEARANCEUR HAZY (A) 11/03/2020 2026   APPEARANCEUR Cloudy (A) 10/08/2020 1545   LABSPEC >1.046 (H) 11/03/2020 2026   LABSPEC 1.014 08/10/2013 1825   PHURINE 5.0 11/03/2020  2026   GLUCOSEU NEGATIVE 11/03/2020 2026   GLUCOSEU Negative 08/10/2013 1825   HGBUR LARGE (A) 11/03/2020 2026   BILIRUBINUR NEGATIVE 11/03/2020 2026   BILIRUBINUR Negative 10/08/2020 1545   BILIRUBINUR 1+ 08/10/2013 [place] 11/03/2020 2026   PROTEINUR 30 (A) 11/03/2020 2026   NITRITE NEGATIVE 11/03/2020 2026   LEUKOCYTESUR NEGATIVE 11/03/2020 2026   LEUKOCYTESUR Negative 08/10/2013 1825   Sepsis Labs Invalid input(s): PROCALCITONIN,  WBC,  LACTICIDVEN Microbiology Recent Results (from the past 240 hour(s))  MRSA Next Gen by PCR, Nasal     Status: None   Collection Time: 10/28/20 12:30 PM   Specimen: Nasal Mucosa; Nasal Swab  Result Value Ref Range Status   MRSA by PCR Next Gen NOT DETECTED NOT DETECTED Final    Comment: (NOTE) The GeneXpert MRSA Assay (FDA approved for NASAL specimens only), is one component of a comprehensive MRSA colonization surveillance program. It is not intended to diagnose MRSA infection nor to guide or monitor treatment for MRSA infections. Test performance is not FDA approved in patients less than 38 years old. Performed at [facility], [address] [place], [place] [address]   Blood culture (single)     Status: None (Preliminary result)   Collection Time: 11/03/20  1:26 PM   Specimen: BLOOD  Result Value Ref Range Status   Specimen Description BLOOD RIGHT [facility]  Final   Special Requests   Final    BOTTLES DRAWN AEROBIC AND ANAEROBIC Blood Culture adequate volume   Culture   Final    NO GROWTH 3 DAYS Performed at [facility], [address], [place], [place] [address]    Report Status PENDING  Incomplete  Resp Panel by RT-PCR (Flu A&B, Covid) Nasopharyngeal Swab     Status: None   Collection Time: 11/03/20  1:26 PM   Specimen: Nasopharyngeal Swab; Nasopharyngeal(NP) swabs in vial transport medium  Result Value Ref Range Status   SARS Coronavirus 2 by RT PCR NEGATIVE NEGATIVE Final    Comment:  (NOTE) SARS-CoV-2 target nucleic acids are NOT DETECTED.  The SARS-CoV-2 RNA is generally detectable in upper respiratory specimens during the acute phase of infection. The lowest concentration of SARS-CoV-2 viral copies this assay can detect is 138 copies/mL. A negative result does not preclude SARS-Cov-2 infection and should not be used as the sole basis for treatment or other patient management decisions. A negative result may occur with  improper specimen collection/handling, submission of specimen other than nasopharyngeal swab, presence of viral mutation(s) within the areas targeted by this assay, and inadequate number of viral copies(<138 copies/mL). A negative result must be combined with clinical observations, patient history, and epidemiological information. The expected result is Negative.  Fact Sheet for Patients:  EntrepreneurPulse.com.au  Fact Sheet for Healthcare Providers:  IncredibleEmployment.be  This test is no t yet approved or cleared by the [place] FDA and  has been authorized for detection and/or diagnosis of SARS-CoV-2 by FDA under an Emergency Use Authorization (EUA). This EUA will remain  in effect (meaning this test can be used) for the duration of the COVID-19 declaration under Section 564(b)(1) of the Act, 21 U.S.C.section 360bbb-3(b)(1), unless the authorization is terminated  or revoked sooner.       Influenza A by PCR NEGATIVE NEGATIVE Final   Influenza B by PCR NEGATIVE NEGATIVE Final    Comment: (NOTE) The Xpert Xpress SARS-CoV-2/FLU/RSV plus assay is intended as an aid in the diagnosis of influenza from Nasopharyngeal swab specimens and should not be used as a sole basis for treatment. Nasal washings and aspirates are unacceptable for Xpert Xpress SARS-CoV-2/FLU/RSV testing.  Fact Sheet for Patients: EntrepreneurPulse.com.au  Fact Sheet for Healthcare  Providers: IncredibleEmployment.be  This test is not yet approved or cleared by the [place] FDA and has been authorized for detection and/or diagnosis of SARS-CoV-2 by FDA under an Emergency Use Authorization (EUA). This EUA will remain in effect (meaning this test can be used) for the duration of the COVID-19 declaration under Section 564(b)(1) of the Act, 21 U.S.C. section 360bbb-3(b)(1), unless the authorization is terminated or revoked.  Performed at [facility], [address] [place], [place] [address]   Blood culture (single)     Status: None (Preliminary result)   Collection Time: 11/03/20  2:30 PM   Specimen: BLOOD  Result Value Ref Range Status   Specimen Description BLOOD LEFT ANTECUBITAL  Final   Special Requests   Final    BOTTLES DRAWN AEROBIC AND ANAEROBIC Blood Culture results may not be optimal due to an excessive volume of blood received in culture bottles   Culture   Final    NO GROWTH 3 DAYS Performed at [facility], [address], [place], [place] [address]    Report Status PENDING  Incomplete  Urine Culture     Status: None   Collection Time: 11/03/20  8:26 PM   Specimen: Urine, Clean Catch  Result Value Ref Range Status   Specimen Description   Final    URINE, CLEAN CATCH Performed at [facility], [address] [place], [place] [address]    Special Requests   Final    NONE Performed at [facility], [address] [place], [place] [address]    Culture   Final    NO GROWTH Performed at [facility], [place] [address] [place],  [address]    Report Status 11/04/2020 FINAL  Final     Time coordinating discharge: Over 30 minutes  SIGNED:   Azaliah Carrero J [place] DO  [facility] 11/06/2020, 2:57 PM

## 2020-11-06 NOTE — Discharge Instructions (Signed)
CheckDiscontinue amlodipine, metoprolol, furosemide, isosorbide mononitrate.  Pressure once daily at home over the next 7 days and follow-up with PCP for further guidance for further blood pressure management if needed.

## 2020-11-06 NOTE — Progress Notes (Signed)
Occupational Therapy Treatment Patient Details Name: [name] Browning MRN: [id number] DOB: 11/25/1940 Today's Date: 11/06/2020   History of present illness Pt is a 80 y/o M with PMH: CAD, Afib, HTN, HLD, and recent admission for sepsis due to VRE UTI, treated with linezolid. Pt presented to ED d/t nausea, diarrhea, and general fatigue. Being w/u for severe sepsis of unknown source. No stool sample yet. Recently d/c on 10/22.   OT comments  Pt awake and in bed upon OT arrival.  Agreeable for participation in OT tx session.  Pt tolerated ambulation to bathroom for toileting, stood at sink for hand hygiene, returned to bed for VS check.  Ambulation and ADLs all with set up/supv to manage IV pole, 02 tank, and RW.  02 sats 96% on 2L 02 pre and post amb to bathroom.  RW and BSC delivered for take home during OT session.  OT demonstrated adjustment and educated on uses/placement of BSC over toilet, in shower, or at bedside, adjusted walker to appropriate height.  OT assisted pt to sit up in recliner, with encouragement to complete ankle pumps hourly while resting; able to return demo. Verbalized understanding of education provided.  Pt with new contact precautions for VRE.  Pt has endoscopy scheduled for this afternoon.  OT left pt in recliner with all needs met.  SN notified of pt status.    Recommendations for follow up therapy are one component of a multi-disciplinary discharge planning process, led by the attending physician.  Recommendations may be updated based on patient status, additional functional criteria and insurance authorization.    Follow Up Recommendations  Home health OT    Assistance Recommended at Discharge Set up Supervision/Assistance  Equipment Recommendations  BSC;Tub/shower seat (BSC and RW delivered to room this morning for take home)    Recommendations for Other Services      Precautions / Restrictions Precautions Precautions: Fall Precaution Comments: VRE, contact  precautions as of this morning Restrictions Weight Bearing Restrictions: No       Mobility Bed Mobility Overal bed mobility: Needs Assistance Bed Mobility: Supine to Sit     Supine to sit: Independent     General bed mobility comments: supine to sit with bed rail Patient Response: Cooperative  Transfers Overall transfer level: Needs assistance Equipment used: Rolling walker (2 wheels) Transfers: Sit to/from Stand Sit to Stand: Supervision           General transfer comment: from fully lower EOB     Balance Overall balance assessment: Needs assistance Sitting-balance support: No upper extremity supported;Feet supported Sitting balance-Leahy Scale: Good     Standing balance support: During functional activity;Bilateral upper extremity supported Standing balance-Leahy Scale: Fair Standing balance comment: Uses RW but does not rely on it.                           ADL either performed or assessed with clinical judgement   ADL Overall ADL's : Needs assistance/impaired     Grooming: Wash/dry hands Grooming Details (indicate cue type and reason): set up/ SBA only d/t managing 02, walker, and IV pole while standing at sink                 Toilet Transfer: Set up;Supervision/safety;Ambulation;Grab bars;Rolling walker (2 wheels) Toilet Transfer Details (indicate cue type and reason): ambulated bed<>bathroom toilet with close supv to manage IV pole, RW, 02 tank Toileting- Clothing Manipulation and Hygiene: Independent Toileting - Clothing Manipulation Details (indicate  cue type and reason): managing hospital gown     Functional mobility during ADLs: Supervision/safety;Set up;Rolling walker (2 wheels) General ADL Comments: supv to manage equipment (IV pole, RW, 02 tank); amb bed to bathroom for toileting, back to bed, then up to bedside recliner with supv/set up     Vision Patient Visual Report: No change from baseline     Perception     Praxis       Cognition Arousal/Alertness: Awake/alert Behavior During Therapy: WFL for tasks assessed/performed Overall Cognitive Status: Within Functional Limits for tasks assessed                                 General Comments: A&Ox4. Pleasant throughout.                      General Comments Sp02 96% pre/post amb to bathroom for toileting on 2L 02.    Pertinent Vitals/ Pain       Pain Assessment: 0-10 Pain Score: 3  Pain Location: pt points to chest/throat (has endoscopy scheduled for later this afternoon) Pain Descriptors / Indicators: Burning Pain Intervention(s): Monitored during session                                                          Frequency  Min 1X/week        Progress Toward Goals  OT Goals(current goals can now be found in the care plan section)  Progress towards OT goals: Progressing toward goals  Acute Rehab OT Goals Patient Stated Goal: to get better and stay out of the hospital OT Goal Formulation: With patient Time For Goal Achievement: 11/18/20 Potential to Achieve Goals: Good  Plan Discharge plan remains appropriate;Frequency remains appropriate    Co-evaluation                 AM-PAC OT "6 Clicks" Daily Activity     Outcome Measure   Help from another person eating meals?: None Help from another person taking care of personal grooming?: A Little Help from another person toileting, which includes using toliet, bedpan, or urinal?: A Little Help from another person bathing (including washing, rinsing, drying)?: A Little Help from another person to put on and taking off regular upper body clothing?: None Help from another person to put on and taking off regular lower body clothing?: A Little 6 Click Score: 20    End of Session Equipment Utilized During Treatment: Rolling walker (2 wheels);Oxygen  OT Visit Diagnosis: Muscle weakness (generalized) (M62.81)   Activity Tolerance Patient  tolerated treatment well   Patient Left in chair;with call bell/phone within reach   Nurse Communication Mobility status;Precautions        Time: 4098-1191 OT Time Calculation (min): 35 min  Charges: OT General Charges $OT Visit: 1 Visit OT Treatments $Self Care/Home Management : 23-37 mins  [name], MS, OTR/L   [name] 11/06/2020, 10:00 AM

## 2020-11-06 NOTE — Progress Notes (Signed)
Blood pressure (!) 114/97, pulse 79, temperature 98 F (36.7 C), resp. rate 15, height 6\' 2"  (1.88 m), weight 86.2 kg, SpO2 99 %. IV  cath removed site c/d/I, discussed d/c packet with patient where he verbalized his understanding of the information. Pt went home with dme and all papers and belongings and transport down to private car to his daughter.

## 2020-11-06 NOTE — Plan of Care (Signed)
No acute events during the night. VSS. NPO since MN pending EGD today. NS infusing @ 100.  Problem: Education: Goal: Knowledge of General Education information will improve Description: Including pain rating scale, medication(s)/side effects and non-pharmacologic comfort measures Outcome: Progressing   Problem: Health Behavior/Discharge Planning: Goal: Ability to manage health-related needs will improve Outcome: Progressing   Problem: Clinical Measurements: Goal: Ability to maintain clinical measurements within normal limits will improve Outcome: Progressing Goal: Will remain free from infection Outcome: Progressing Goal: Diagnostic test results will improve Outcome: Progressing Goal: Respiratory complications will improve Outcome: Progressing Goal: Cardiovascular complication will be avoided Outcome: Progressing   Problem: Activity: Goal: Risk for activity intolerance will decrease Outcome: Progressing   Problem: Nutrition: Goal: Adequate nutrition will be maintained Outcome: Progressing   Problem: Coping: Goal: Level of anxiety will decrease Outcome: Progressing   Problem: Elimination: Goal: Will not experience complications related to bowel motility Outcome: Progressing Goal: Will not experience complications related to urinary retention Outcome: Progressing   Problem: Pain Managment: Goal: General experience of comfort will improve Outcome: Progressing   Problem: Safety: Goal: Ability to remain free from injury will improve Outcome: Progressing   Problem: Skin Integrity: Goal: Risk for impaired skin integrity will decrease Outcome: Progressing

## 2020-11-06 NOTE — TOC Progression Note (Addendum)
Transition of Care [facility]) - Progression Note    Patient Details  Name: [name] Browning MRN: [id number] Date of Birth: 04/22/1940  Transition of Care [facility] [place]) CM/SW [place], RN Phone Number: 11/06/2020, 9:36 AM  Clinical Narrative:    As per Caryl Pina at [place], patient has been denied by [employer] for Trilogy at home.   Patient will need blood gas with CO2 above 50 and PO2 at 60 or below to qualify.  [employer] number is [phone] for appeal member number [id number] Ref #/case # [id number]. Message sent to care team to prep for discharge.  Please contact Caryl Pina at [place] prior to discharge. **Family will need to bring oxygen concentrator at discharge--Granddaughter.Perkins,[name] (Daughter)  [phone] [facility])  [name] Granddaughter     [phone]    TOC to follow to discharge.        Expected Discharge Plan and Services                                                 Social Determinants of Health (SDOH) Interventions    Readmission Risk Interventions No flowsheet data found.

## 2020-11-06 NOTE — Progress Notes (Signed)
PT Cancellation Note   Patient currently off floor for procedure. Not available for PT treatment session. Will re-attempt PT treatment session at a later date.   Iva Boop, PT  11/06/20. 1:36 PM

## 2020-11-06 NOTE — Transfer of Care (Signed)
Immediate Anesthesia Transfer of Care Note  Patient: [name] Browning  Procedure(s) Performed: ESOPHAGOGASTRODUODENOSCOPY (EGD)  Patient Location: PACU  Anesthesia Type:General  Level of Consciousness: awake, alert  and oriented  Airway & Oxygen Therapy: Patient Spontanous Breathing and Patient connected to nasal cannula oxygen  Post-op Assessment: Report given to RN and Post -op Vital signs reviewed and stable  Post vital signs: Reviewed and stable  Last Vitals:  Vitals Value Taken Time  BP 91/64 11/06/20 1337  Temp    Pulse 67 11/06/20 1337  Resp 21 11/06/20 1337  SpO2 100 % 11/06/20 1337    Last Pain:  Vitals:   11/06/20 1332  TempSrc: Temporal  PainSc: Asleep         Complications: No notable events documented.

## 2020-11-06 NOTE — Progress Notes (Signed)
The patient had a procedure today and for some reason the procedure note did not transfer over to the chart.  The findings and recommendations are as follows:

## 2020-11-08 LAB — CULTURE, BLOOD (SINGLE)
Culture: NO GROWTH
Culture: NO GROWTH
Special Requests: ADEQUATE

## 2020-11-09 ENCOUNTER — Encounter: Payer: Self-pay | Admitting: Gastroenterology

## 2020-11-09 ENCOUNTER — Other Ambulatory Visit: Payer: Self-pay | Admitting: Urology

## 2020-11-09 DIAGNOSIS — J449 Chronic obstructive pulmonary disease, unspecified: Secondary | ICD-10-CM | POA: Diagnosis not present

## 2020-11-09 DIAGNOSIS — N201 Calculus of ureter: Secondary | ICD-10-CM

## 2020-11-09 DIAGNOSIS — R310 Gross hematuria: Secondary | ICD-10-CM

## 2020-11-09 NOTE — Op Note (Signed)
[facility] Gastroenterology Patient Name: [name] Browning Procedure Date: 11/06/2020 1:03 PM MRN: [id number] Account #: [id number] Date of Birth: 16-Aug-1940 Admit Type: Outpatient Age: 80 Room: [facility] ENDO ROOM 3 Gender: Male Note Status: Addendum Instrument Name: Michaelle Birks [id number] Procedure:             Upper GI endoscopy Indications:           Dysphagia Providers:             Lucilla Lame MD, MD Referring MD:          [place] (Referring MD) Medicines:             Propofol per Anesthesia Complications:         No immediate complications. Procedure:             Pre-Anesthesia Assessment:                        - Prior to the procedure, a History and Physical was                         performed, and patient medications and allergies were                         reviewed. The patient's tolerance of previous                         anesthesia was also reviewed. The risks and benefits                         of the procedure and the sedation options and risks                         were discussed with the patient. All questions were                         answered, and informed consent was obtained. Prior                         Anticoagulants: The patient has taken no previous                         anticoagulant or antiplatelet agents. ASA Grade                         Assessment: III - A patient with severe systemic                         disease. After reviewing the risks and benefits, the                         patient was deemed in satisfactory condition to                         undergo the procedure.                        After obtaining informed consent, the endoscope was  passed under direct vision. Throughout the procedure,                         the patient's blood pressure, pulse, and oxygen                         saturations were monitored continuously. The Endoscope                         was introduced  through the mouth, and advanced to the                         second part of duodenum. The upper GI endoscopy was                         accomplished without difficulty. The patient tolerated                         the procedure well. Findings:      The examined esophagus was normal.      The stomach was normal.      The examined duodenum was normal. Impression:            - Normal esophagus.                        - Normal stomach.                        - Normal examined duodenum .                        - No specimens collected. Recommendation:        - Return patient to hospital ward for ongoing care.                        - Resume previous diet.                        - Continue present medications. Procedure Code(s):     --- Professional ---                        [phone], Esophagogastroduodenoscopy, flexible,                         transoral; diagnostic, including collection of                         specimen(s) by brushing or washing, when performed                         (separate procedure) Diagnosis Code(s):     --- Professional ---                        R13.10, Dysphagia, unspecified CPT copyright 2019 [employer]. All rights reserved. The codes documented in this report are preliminary and upon coder review may  be revised to meet current compliance requirements. Lucilla Lame MD, MD 11/06/2020 1:31:45 PM This report has been signed electronically. Number of Addenda: 1 Note Initiated On: 11/06/2020 1:03 PM Estimated Blood  Loss:  Estimated blood loss: none.      [facility] Addendum Number: 1   Addendum Date: 11/06/2020 5:19:02 PM                         Lucilla Lame MD, MD 11/06/2020 5:19:10 PM This report has been signed electronically.

## 2020-11-09 NOTE — Telephone Encounter (Signed)
Pt needs refill for Flomax sent to [facility] in [place].

## 2020-11-09 NOTE — Anesthesia Postprocedure Evaluation (Signed)
Anesthesia Post Note  Patient: MAXIMUS HOFFERT  Procedure(s) Performed: ESOPHAGOGASTRODUODENOSCOPY (EGD)  Patient location during evaluation: PACU Anesthesia Type: General Level of consciousness: awake and alert, oriented and patient cooperative Pain management: pain level controlled Vital Signs Assessment: post-procedure vital signs reviewed and stable Respiratory status: spontaneous breathing, nonlabored ventilation and respiratory function stable Cardiovascular status: blood pressure returned to baseline and stable Postop Assessment: adequate PO intake Anesthetic complications: no   No notable events documented.   Last Vitals:  Vitals:   11/06/20 1402 11/06/20 1509  BP: (!) 102/47 (!) 114/97  Pulse: 70 79  Resp: (!) 21 15  Temp:  36.7 C  SpO2: 100% 99%    Last Pain:  Vitals:   11/06/20 1402  TempSrc:   PainSc: 0-No pain                 Darrin Nipper

## 2020-11-11 ENCOUNTER — Other Ambulatory Visit: Payer: Self-pay

## 2020-11-11 ENCOUNTER — Encounter: Payer: Self-pay | Admitting: Emergency Medicine

## 2020-11-11 ENCOUNTER — Inpatient Hospital Stay
Admission: EM | Admit: 2020-11-11 | Discharge: 2020-12-02 | DRG: 280 | Disposition: A | Discharge status: Home / Self Care | Payer: Medicare HMO | Attending: Internal Medicine | Admitting: Internal Medicine

## 2020-11-11 ENCOUNTER — Emergency Department: Payer: Medicare HMO

## 2020-11-11 ENCOUNTER — Inpatient Hospital Stay: Payer: Medicare HMO

## 2020-11-11 DIAGNOSIS — I959 Hypotension, unspecified: Secondary | ICD-10-CM | POA: Diagnosis not present

## 2020-11-11 DIAGNOSIS — N401 Enlarged prostate with lower urinary tract symptoms: Secondary | ICD-10-CM | POA: Diagnosis not present

## 2020-11-11 DIAGNOSIS — R570 Cardiogenic shock: Secondary | ICD-10-CM | POA: Diagnosis not present

## 2020-11-11 DIAGNOSIS — R0603 Acute respiratory distress: Secondary | ICD-10-CM | POA: Diagnosis not present

## 2020-11-11 DIAGNOSIS — E785 Hyperlipidemia, unspecified: Secondary | ICD-10-CM | POA: Diagnosis not present

## 2020-11-11 DIAGNOSIS — N179 Acute kidney failure, unspecified: Secondary | ICD-10-CM | POA: Diagnosis not present

## 2020-11-11 DIAGNOSIS — I509 Heart failure, unspecified: Secondary | ICD-10-CM | POA: Diagnosis not present

## 2020-11-11 DIAGNOSIS — B9620 Unspecified Escherichia coli [E. coli] as the cause of diseases classified elsewhere: Secondary | ICD-10-CM | POA: Diagnosis present

## 2020-11-11 DIAGNOSIS — D696 Thrombocytopenia, unspecified: Secondary | ICD-10-CM

## 2020-11-11 DIAGNOSIS — R000 Tachycardia, unspecified: Secondary | ICD-10-CM | POA: Diagnosis not present

## 2020-11-11 DIAGNOSIS — I495 Sick sinus syndrome: Secondary | ICD-10-CM | POA: Diagnosis not present

## 2020-11-11 DIAGNOSIS — K921 Melena: Secondary | ICD-10-CM | POA: Diagnosis present

## 2020-11-11 DIAGNOSIS — E876 Hypokalemia: Secondary | ICD-10-CM

## 2020-11-11 DIAGNOSIS — J9690 Respiratory failure, unspecified, unspecified whether with hypoxia or hypercapnia: Secondary | ICD-10-CM | POA: Diagnosis not present

## 2020-11-11 DIAGNOSIS — D509 Iron deficiency anemia, unspecified: Secondary | ICD-10-CM | POA: Diagnosis not present

## 2020-11-11 DIAGNOSIS — R578 Other shock: Secondary | ICD-10-CM | POA: Diagnosis not present

## 2020-11-11 DIAGNOSIS — E890 Postprocedural hypothyroidism: Secondary | ICD-10-CM | POA: Diagnosis present

## 2020-11-11 DIAGNOSIS — R112 Nausea with vomiting, unspecified: Secondary | ICD-10-CM | POA: Diagnosis not present

## 2020-11-11 DIAGNOSIS — I214 Non-ST elevation (NSTEMI) myocardial infarction: Secondary | ICD-10-CM

## 2020-11-11 DIAGNOSIS — Z8249 Family history of ischemic heart disease and other diseases of the circulatory system: Secondary | ICD-10-CM

## 2020-11-11 DIAGNOSIS — J9621 Acute and chronic respiratory failure with hypoxia: Secondary | ICD-10-CM | POA: Diagnosis present

## 2020-11-11 DIAGNOSIS — R0902 Hypoxemia: Secondary | ICD-10-CM | POA: Diagnosis not present

## 2020-11-11 DIAGNOSIS — D62 Acute posthemorrhagic anemia: Secondary | ICD-10-CM | POA: Diagnosis present

## 2020-11-11 DIAGNOSIS — R0602 Shortness of breath: Secondary | ICD-10-CM

## 2020-11-11 DIAGNOSIS — J80 Acute respiratory distress syndrome: Secondary | ICD-10-CM | POA: Diagnosis not present

## 2020-11-11 DIAGNOSIS — Z20822 Contact with and (suspected) exposure to covid-19: Secondary | ICD-10-CM | POA: Diagnosis present

## 2020-11-11 DIAGNOSIS — I639 Cerebral infarction, unspecified: Secondary | ICD-10-CM | POA: Diagnosis present

## 2020-11-11 DIAGNOSIS — I517 Cardiomegaly: Secondary | ICD-10-CM | POA: Diagnosis not present

## 2020-11-11 DIAGNOSIS — R0689 Other abnormalities of breathing: Secondary | ICD-10-CM | POA: Diagnosis not present

## 2020-11-11 DIAGNOSIS — J441 Chronic obstructive pulmonary disease with (acute) exacerbation: Secondary | ICD-10-CM | POA: Diagnosis not present

## 2020-11-11 DIAGNOSIS — I4891 Unspecified atrial fibrillation: Secondary | ICD-10-CM | POA: Diagnosis not present

## 2020-11-11 DIAGNOSIS — J811 Chronic pulmonary edema: Secondary | ICD-10-CM | POA: Diagnosis not present

## 2020-11-11 DIAGNOSIS — R300 Dysuria: Secondary | ICD-10-CM | POA: Diagnosis not present

## 2020-11-11 DIAGNOSIS — D638 Anemia in other chronic diseases classified elsewhere: Secondary | ICD-10-CM | POA: Diagnosis present

## 2020-11-11 DIAGNOSIS — R140 Abdominal distension (gaseous): Secondary | ICD-10-CM | POA: Diagnosis not present

## 2020-11-11 DIAGNOSIS — I5082 Biventricular heart failure: Secondary | ICD-10-CM | POA: Diagnosis present

## 2020-11-11 DIAGNOSIS — Z8616 Personal history of COVID-19: Secondary | ICD-10-CM

## 2020-11-11 DIAGNOSIS — I219 Acute myocardial infarction, unspecified: Secondary | ICD-10-CM | POA: Diagnosis not present

## 2020-11-11 DIAGNOSIS — Z9981 Dependence on supplemental oxygen: Secondary | ICD-10-CM

## 2020-11-11 DIAGNOSIS — Z452 Encounter for adjustment and management of vascular access device: Secondary | ICD-10-CM

## 2020-11-11 DIAGNOSIS — Z87891 Personal history of nicotine dependence: Secondary | ICD-10-CM

## 2020-11-11 DIAGNOSIS — Z86718 Personal history of other venous thrombosis and embolism: Secondary | ICD-10-CM

## 2020-11-11 DIAGNOSIS — I700 Atherosclerosis of aorta: Secondary | ICD-10-CM | POA: Diagnosis present

## 2020-11-11 DIAGNOSIS — Z87442 Personal history of urinary calculi: Secondary | ICD-10-CM

## 2020-11-11 DIAGNOSIS — I10 Essential (primary) hypertension: Secondary | ICD-10-CM

## 2020-11-11 DIAGNOSIS — J9622 Acute and chronic respiratory failure with hypercapnia: Secondary | ICD-10-CM | POA: Diagnosis present

## 2020-11-11 DIAGNOSIS — I952 Hypotension due to drugs: Secondary | ICD-10-CM | POA: Diagnosis not present

## 2020-11-11 DIAGNOSIS — I25110 Atherosclerotic heart disease of native coronary artery with unstable angina pectoris: Secondary | ICD-10-CM | POA: Diagnosis not present

## 2020-11-11 DIAGNOSIS — I482 Chronic atrial fibrillation, unspecified: Secondary | ICD-10-CM | POA: Diagnosis not present

## 2020-11-11 DIAGNOSIS — M19011 Primary osteoarthritis, right shoulder: Secondary | ICD-10-CM | POA: Diagnosis present

## 2020-11-11 DIAGNOSIS — Z85828 Personal history of other malignant neoplasm of skin: Secondary | ICD-10-CM

## 2020-11-11 DIAGNOSIS — D649 Anemia, unspecified: Secondary | ICD-10-CM | POA: Diagnosis not present

## 2020-11-11 DIAGNOSIS — R195 Other fecal abnormalities: Secondary | ICD-10-CM | POA: Diagnosis not present

## 2020-11-11 DIAGNOSIS — I480 Paroxysmal atrial fibrillation: Secondary | ICD-10-CM | POA: Diagnosis present

## 2020-11-11 DIAGNOSIS — J9811 Atelectasis: Secondary | ICD-10-CM | POA: Diagnosis not present

## 2020-11-11 DIAGNOSIS — I6523 Occlusion and stenosis of bilateral carotid arteries: Secondary | ICD-10-CM | POA: Diagnosis present

## 2020-11-11 DIAGNOSIS — E860 Dehydration: Secondary | ICD-10-CM | POA: Diagnosis present

## 2020-11-11 DIAGNOSIS — G928 Other toxic encephalopathy: Secondary | ICD-10-CM | POA: Diagnosis not present

## 2020-11-11 DIAGNOSIS — I2582 Chronic total occlusion of coronary artery: Secondary | ICD-10-CM | POA: Diagnosis present

## 2020-11-11 DIAGNOSIS — I21A1 Myocardial infarction type 2: Secondary | ICD-10-CM | POA: Diagnosis not present

## 2020-11-11 DIAGNOSIS — I110 Hypertensive heart disease with heart failure: Secondary | ICD-10-CM | POA: Diagnosis not present

## 2020-11-11 DIAGNOSIS — I2510 Atherosclerotic heart disease of native coronary artery without angina pectoris: Secondary | ICD-10-CM | POA: Diagnosis present

## 2020-11-11 DIAGNOSIS — K922 Gastrointestinal hemorrhage, unspecified: Secondary | ICD-10-CM | POA: Diagnosis present

## 2020-11-11 DIAGNOSIS — I2109 ST elevation (STEMI) myocardial infarction involving other coronary artery of anterior wall: Secondary | ICD-10-CM | POA: Diagnosis not present

## 2020-11-11 DIAGNOSIS — I25119 Atherosclerotic heart disease of native coronary artery with unspecified angina pectoris: Secondary | ICD-10-CM | POA: Diagnosis present

## 2020-11-11 DIAGNOSIS — R079 Chest pain, unspecified: Secondary | ICD-10-CM | POA: Diagnosis not present

## 2020-11-11 DIAGNOSIS — R338 Other retention of urine: Secondary | ICD-10-CM | POA: Diagnosis not present

## 2020-11-11 DIAGNOSIS — Z7982 Long term (current) use of aspirin: Secondary | ICD-10-CM

## 2020-11-11 DIAGNOSIS — Z7901 Long term (current) use of anticoagulants: Secondary | ICD-10-CM

## 2020-11-11 DIAGNOSIS — Z8701 Personal history of pneumonia (recurrent): Secondary | ICD-10-CM

## 2020-11-11 DIAGNOSIS — J449 Chronic obstructive pulmonary disease, unspecified: Secondary | ICD-10-CM | POA: Diagnosis present

## 2020-11-11 DIAGNOSIS — J9601 Acute respiratory failure with hypoxia: Secondary | ICD-10-CM

## 2020-11-11 DIAGNOSIS — I5033 Acute on chronic diastolic (congestive) heart failure: Secondary | ICD-10-CM | POA: Diagnosis not present

## 2020-11-11 DIAGNOSIS — Z79899 Other long term (current) drug therapy: Secondary | ICD-10-CM

## 2020-11-11 DIAGNOSIS — K625 Hemorrhage of anus and rectum: Secondary | ICD-10-CM

## 2020-11-11 DIAGNOSIS — Z8673 Personal history of transient ischemic attack (TIA), and cerebral infarction without residual deficits: Secondary | ICD-10-CM

## 2020-11-11 DIAGNOSIS — I82409 Acute embolism and thrombosis of unspecified deep veins of unspecified lower extremity: Secondary | ICD-10-CM | POA: Diagnosis present

## 2020-11-11 DIAGNOSIS — I255 Ischemic cardiomyopathy: Secondary | ICD-10-CM | POA: Diagnosis present

## 2020-11-11 DIAGNOSIS — K219 Gastro-esophageal reflux disease without esophagitis: Secondary | ICD-10-CM | POA: Diagnosis present

## 2020-11-11 DIAGNOSIS — T447X5A Adverse effect of beta-adrenoreceptor antagonists, initial encounter: Secondary | ICD-10-CM | POA: Diagnosis not present

## 2020-11-11 DIAGNOSIS — F419 Anxiety disorder, unspecified: Secondary | ICD-10-CM | POA: Diagnosis present

## 2020-11-11 LAB — COMPREHENSIVE METABOLIC PANEL
ALT: 27 U/L (ref 0–44)
AST: 23 U/L (ref 15–41)
Albumin: 2.9 g/dL — ABNORMAL LOW (ref 3.5–5.0)
Alkaline Phosphatase: 85 U/L (ref 38–126)
Anion gap: 9 (ref 5–15)
BUN: 18 mg/dL (ref 8–23)
CO2: 28 mmol/L (ref 22–32)
Calcium: 8.5 mg/dL — ABNORMAL LOW (ref 8.9–10.3)
Chloride: 102 mmol/L (ref 98–111)
Creatinine, Ser: 0.91 mg/dL (ref 0.61–1.24)
GFR, Estimated: >60 mL/min (ref >60)
Glucose, Bld: 107 mg/dL — ABNORMAL HIGH (ref 70–99)
Potassium: 3.3 mmol/L — ABNORMAL LOW (ref 3.5–5.1)
Sodium: 139 mmol/L (ref 135–145)
Total Bilirubin: 1.3 mg/dL — ABNORMAL HIGH (ref 0.3–1.2)
Total Protein: 5.7 g/dL — ABNORMAL LOW (ref 6.5–8.1)

## 2020-11-11 LAB — CBC
HCT: 26.3 % — ABNORMAL LOW (ref 39.0–52.0)
HCT: 26.8 % — ABNORMAL LOW (ref 39.0–52.0)
Hemoglobin: 8.2 g/dL — ABNORMAL LOW (ref 13.0–17.0)
Hemoglobin: 8.5 g/dL — ABNORMAL LOW (ref 13.0–17.0)
MCH: 29.8 pg (ref 26.0–34.0)
MCH: 30.2 pg (ref 26.0–34.0)
MCHC: 31.2 g/dL (ref 30.0–36.0)
MCHC: 31.7 g/dL (ref 30.0–36.0)
MCV: 95.6 fL (ref 80.0–100.0)
MCV: 95.4 fL (ref 80.0–100.0)
Platelets: 119 10*3/uL — ABNORMAL LOW (ref 150–400)
Platelets: 116 10*3/uL — ABNORMAL LOW (ref 150–400)
RBC: 2.75 MIL/uL — ABNORMAL LOW (ref 4.22–5.81)
RBC: 2.81 MIL/uL — ABNORMAL LOW (ref 4.22–5.81)
RDW: 15.4 % (ref 11.5–15.5)
RDW: 15.3 % (ref 11.5–15.5)
WBC: 9.3 10*3/uL (ref 4.0–10.5)
WBC: 9.2 10*3/uL (ref 4.0–10.5)
nRBC: 0 % (ref 0.0–0.2)
nRBC: 0 % (ref 0.0–0.2)

## 2020-11-11 LAB — PROTIME-INR
INR: 1.5 — ABNORMAL HIGH (ref 0.8–1.2)
Prothrombin Time: 18.1 seconds — ABNORMAL HIGH (ref 11.4–15.2)

## 2020-11-11 LAB — RESP PANEL BY RT-PCR (FLU A&B, COVID) ARPGX2
Influenza A by PCR: NEGATIVE
Influenza B by PCR: NEGATIVE
SARS Coronavirus 2 by RT PCR: NEGATIVE

## 2020-11-11 LAB — MAGNESIUM: Magnesium: 2 mg/dL (ref 1.7–2.4)

## 2020-11-11 LAB — HEMOGLOBIN A1C
Hgb A1c MFr Bld: 6 % — ABNORMAL HIGH (ref 4.8–5.6)
Mean Plasma Glucose: 125.5 mg/dL

## 2020-11-11 LAB — APTT: aPTT: 44 seconds — ABNORMAL HIGH (ref 24–36)

## 2020-11-11 LAB — TROPONIN I (HIGH SENSITIVITY)
Troponin I (High Sensitivity): 620 ng/L (ref <18)
Troponin I (High Sensitivity): 665 ng/L (ref <18)
Troponin I (High Sensitivity): 632 ng/L (ref <18)
Troponin I (High Sensitivity): 493 ng/L (ref <18)

## 2020-11-11 LAB — BRAIN NATRIURETIC PEPTIDE: B Natriuretic Peptide: 220.2 pg/mL — ABNORMAL HIGH (ref 0.0–100.0)

## 2020-11-11 MED ORDER — PHENAZOPYRIDINE HCL 200 MG PO TABS
200.0000 mg | ORAL_TABLET | Freq: Three times a day (TID) | ORAL | Status: DC | PRN
  Filled 2020-11-11: qty 1

## 2020-11-11 MED ORDER — MOMETASONE FURO-FORMOTEROL FUM 200-5 MCG/ACT IN AERO
2.0000 | INHALATION_SPRAY | Freq: Two times a day (BID) | RESPIRATORY_TRACT | Status: DC
  Administered 2020-11-12 – 2020-12-02 (×38): 2 via RESPIRATORY_TRACT
  Filled 2020-11-11 (×3): qty 8.8

## 2020-11-11 MED ORDER — ATORVASTATIN CALCIUM 20 MG PO TABS
40.0000 mg | ORAL_TABLET | Freq: Every day | ORAL | Status: DC
  Administered 2020-11-12 – 2020-12-01 (×19): 40 mg via ORAL
  Filled 2020-11-11 (×19): qty 2
  Filled 2020-11-11: qty 4

## 2020-11-11 MED ORDER — NITROGLYCERIN 0.4 MG SL SUBL
0.4000 mg | SUBLINGUAL_TABLET | SUBLINGUAL | Status: DC | PRN
  Administered 2020-11-12 – 2020-11-19 (×4): 0.4 mg via SUBLINGUAL
  Filled 2020-11-11 (×3): qty 1

## 2020-11-11 MED ORDER — MOMETASONE FURO-FORMOTEROL FUM 200-5 MCG/ACT IN AERO
2.0000 | INHALATION_SPRAY | Freq: Two times a day (BID) | RESPIRATORY_TRACT | Status: DC
  Filled 2020-11-11: qty 8.8

## 2020-11-11 MED ORDER — FERROUS GLUCONATE 324 (38 FE) MG PO TABS
324.0000 mg | ORAL_TABLET | Freq: Every day | ORAL | Status: DC
  Administered 2020-11-12 – 2020-12-01 (×19): 324 mg via ORAL
  Filled 2020-11-11 (×23): qty 1

## 2020-11-11 MED ORDER — HEPARIN BOLUS VIA INFUSION
2000.0000 [IU] | Freq: Once | INTRAVENOUS | Status: DC
  Filled 2020-11-11: qty 2000

## 2020-11-11 MED ORDER — HEPARIN (PORCINE) 25000 UT/250ML-% IV SOLN
1100.0000 [IU]/h | INTRAVENOUS | Status: DC
  Filled 2020-11-11: qty 250

## 2020-11-11 MED ORDER — POLYETHYLENE GLYCOL 3350 17 G PO PACK
17.0000 g | PACK | Freq: Every day | ORAL | Status: DC | PRN
  Administered 2020-11-19: 17 g via ORAL
  Filled 2020-11-11: qty 1

## 2020-11-11 MED ORDER — IPRATROPIUM-ALBUTEROL 0.5-2.5 (3) MG/3ML IN SOLN
3.0000 mL | RESPIRATORY_TRACT | Status: DC
  Administered 2020-11-11 – 2020-11-12 (×7): 3 mL via RESPIRATORY_TRACT
  Filled 2020-11-11 (×7): qty 3

## 2020-11-11 MED ORDER — HYDRALAZINE HCL 20 MG/ML IJ SOLN: 5.0000 mg | INTRAMUSCULAR | Status: DC | PRN

## 2020-11-11 MED ORDER — PANTOPRAZOLE SODIUM 40 MG IV SOLR
40.0000 mg | Freq: Two times a day (BID) | INTRAVENOUS | Status: DC
  Administered 2020-11-11 – 2020-11-20 (×18): 40 mg via INTRAVENOUS
  Filled 2020-11-11 (×19): qty 40

## 2020-11-11 MED ORDER — IPRATROPIUM-ALBUTEROL 0.5-2.5 (3) MG/3ML IN SOLN
3.0000 mL | Freq: Once | RESPIRATORY_TRACT | Status: AC
  Administered 2020-11-11: 3 mL via RESPIRATORY_TRACT
  Filled 2020-11-11: qty 3

## 2020-11-11 MED ORDER — ONDANSETRON HCL 4 MG/2ML IJ SOLN
4.0000 mg | Freq: Three times a day (TID) | INTRAMUSCULAR | Status: DC | PRN
  Filled 2020-11-11: qty 2

## 2020-11-11 MED ORDER — MONTELUKAST SODIUM 10 MG PO TABS
10.0000 mg | ORAL_TABLET | Freq: Every day | ORAL | Status: DC
  Administered 2020-11-12 – 2020-11-16 (×5): 10 mg via ORAL
  Filled 2020-11-11 (×6): qty 1

## 2020-11-11 MED ORDER — DM-GUAIFENESIN ER 30-600 MG PO TB12: 1.0000 | ORAL_TABLET | Freq: Two times a day (BID) | ORAL | Status: DC | PRN

## 2020-11-11 MED ORDER — ACETAMINOPHEN 325 MG PO TABS
650.0000 mg | ORAL_TABLET | Freq: Four times a day (QID) | ORAL | Status: DC | PRN
  Administered 2020-11-12 – 2020-11-26 (×5): 650 mg via ORAL
  Filled 2020-11-11 (×5): qty 2

## 2020-11-11 MED ORDER — ALBUTEROL SULFATE (2.5 MG/3ML) 0.083% IN NEBU: 2.5000 mg | INHALATION_SOLUTION | RESPIRATORY_TRACT | Status: DC | PRN

## 2020-11-11 MED ORDER — METHYLPREDNISOLONE SODIUM SUCC 125 MG IJ SOLR
125.0000 mg | Freq: Once | INTRAMUSCULAR | Status: AC
  Administered 2020-11-11: 125 mg via INTRAVENOUS
  Filled 2020-11-11: qty 2

## 2020-11-11 MED ORDER — FUROSEMIDE 10 MG/ML IJ SOLN
20.0000 mg | Freq: Two times a day (BID) | INTRAMUSCULAR | Status: DC
  Administered 2020-11-11 – 2020-11-15 (×6): 20 mg via INTRAVENOUS
  Filled 2020-11-11: qty 4
  Filled 2020-11-11 (×3): qty 2
  Filled 2020-11-11: qty 4
  Filled 2020-11-11: qty 2

## 2020-11-11 MED ORDER — TAMSULOSIN HCL 0.4 MG PO CAPS
0.4000 mg | ORAL_CAPSULE | Freq: Every day | ORAL | Status: DC
  Administered 2020-11-12 – 2020-12-02 (×20): 0.4 mg via ORAL
  Filled 2020-11-11 (×20): qty 1

## 2020-11-11 MED ORDER — POTASSIUM CHLORIDE CRYS ER 20 MEQ PO TBCR
40.0000 meq | EXTENDED_RELEASE_TABLET | ORAL | Status: AC
  Administered 2020-11-11 (×2): 40 meq via ORAL
  Filled 2020-11-11 (×2): qty 2

## 2020-11-11 NOTE — Progress Notes (Signed)
[facility] for heparin Indication: chest pain/ACS  Allergies  Allergen Reactions   Hydromorphone     Hallucination Pt reports he doesn't know about this    Patient Measurements: Height: 6\' 2"  (188 cm) Weight: 86.6 kg (191 lb) IBW/kg (Calculated) : 82.2 Heparin Dosing Weight: 86 kg  Vital Signs: Temp: 98.3 F (36.8 C) (11/02 1319) Temp Source: Oral (11/02 1319) BP: 107/65 (11/02 1430) Pulse Rate: 85 (11/02 1430)  Labs: Recent Labs    11/11/20 1340  HGB 8.2*  HCT 26.3*  PLT 119*  CREATININE 0.91  TROPONINIHS 620*    Estimated Creatinine Clearance: 76.5 mL/min (by C-G formula based on SCr of 0.91 mg/dL).   Medical History: Past Medical History:  Diagnosis Date   Anginal pain ([place])    Anxiety    Aortic atherosclerosis ([place])    Atrial fibrillation ([place]) 01/2019   Bilateral carpal tunnel syndrome    CAD (coronary artery disease)    a.) LHC 2004 --> normal coronaries. b.) normal stress test in 2007 and 2011; c.) Lexiscan 05/20/2014 --> LVEF 55-65%; no significant stress induced ischemia/arrythmia. d.) CT chest 03/25/2019 --> coronaries carcified.   Carotid atherosclerosis, bilateral    Carpal tunnel syndrome, bilateral    Chronic anticoagulation    a.) ASA + apixaban   COPD (chronic obstructive pulmonary disease) (HCC)    CVA (cerebral vascular accident) ([place])    Degenerative disc disease, cervical    Diastolic dysfunction    a.) TTE 05/29/2014 --> LVEF 60-65%; G1DD.   DVT (deep venous thrombosis) (HCC)    GERD (gastroesophageal reflux disease)    History of 2019 novel coronavirus disease (COVID-19) 02/08/2019   HLD (hyperlipidemia)    Hypertension    Kidney stones    Osteoarthritis of right shoulder    Pneumonia    Respiratory failure, acute ([place]) 09/20/2020   a.) severe respiratory distress 1 hour after urological surgery. CXR (+) for acute pulmonary edema. Transferred to ICU and placed on NIPPV. Questionable aspiration  PNA. (+) A.fib with RVR. Improved with ABX, diuresis, and amiodarone.   Skin cancer of face    a.) RIGHT ear and RIGHT forehead; excised.   Syncope    TIA (transient ischemic attack) 2016   Valvular regurgitation    a.) TTE 05/26/2014 --> LVEF 60-65%; trivial MR, mild TR; no AR or PR. b.) TTE 04/20/2015 --> LVEF 55-60%; trivial MR and PR; no AR or TR.      Assessment: 80 year old male presented with SOB. Patient with PMH of atrial fibrillation on Eliquis. Pharmacy consulted for heparin drip for ACS / STEMI.  Goal of Therapy:  Heparin level 0.3-0.7 units/ml aPTT 66-102 seconds Monitor platelets by anticoagulation protocol: Yes   Plan:  Heparin 2000 unit bolus (reduced in setting of Eliquis taken this morning) Start heparin infusion at 1100 units/hr Check aPTT 11/3 at 0000 CBC and HL daily while on heparin Plan to follow HL once correlating with aPTT  Tawnya Crook, PharmD, BCPS Clinical Pharmacist 11/11/2020 3:29 PM

## 2020-11-11 NOTE — Consult Note (Signed)
[facility] Cardiology  CARDIOLOGY CONSULT NOTE  Patient ID: [name] Browning MRN: [id number] DOB/AGE: 16-Oct-1940 80 y.o.  Admit date: 11/11/2020 Referring Physician Ivor Costa Primary Physician [place] Primary Cardiologist Jordan Hawks Reason for Consultation Elevated troponin  HPI:  [name] Browning is a 80 year old male with history of CAD (nonobstructive calcified coronary disease), atrial fibrillation on Eliquis, HFpEF, hypertension, TIA in 2016, COPD on 2 L who was admitted to the hospital with shortness of breath.  Cardiology is consulted for evaluation of an elevated troponin.  Since he was discharged on Friday, and his medications were changed at discharge. His metoprolol XL 25 mg was stopped, though they restarted this because his HR was high at home. His lasix was also stopped. He has been having increased shortness of breath, and increased LE edema. He was having some black colored stools as well.   He is admitted to the hospital 2 times in the last month.  First admission was from 10/20/2020 to 10/31/2020 when he was admitted with respiratory failure due to COPD and pneumonia, as well as an obstructive left renal stone.  He developed hypotension and worsening respiratory distress requiring pressors and BiPAP.  Blood cultures grew positive for Enterococcus faecalis and staph hemolyticus.  He was treated with antibiotics ending 11/03/2020.  The patient was recently admitted to the hospital from 10/25-10/28 with complaints of dysphagia.  He was seen by gastroenterology who underwent an EGD with no significant findings.  Since that time he has had progressive worsening shortness of breath, eventually presented to the emergency department where he was found to have an oxygen saturation of 88% on his home 2 L of oxygen.  His labs are otherwise notable for a troponin of 620, BNP of 220, hemoglobin of 8.2 (9.7 on 11/06/2020).  Chest x-ray shows diffuse bilateral infiltrates which could be consistent with  edema but some overlying scar.  Review of systems complete and found to be negative unless listed above     Past Medical History:  Diagnosis Date   Anginal pain ([place])    Anxiety    Aortic atherosclerosis ([place])    Atrial fibrillation ([place]) 01/2019   Bilateral carpal tunnel syndrome    CAD (coronary artery disease)    a.) LHC 2004 --> normal coronaries. b.) normal stress test in 2007 and 2011; c.) Lexiscan 05/20/2014 --> LVEF 55-65%; no significant stress induced ischemia/arrythmia. d.) CT chest 03/25/2019 --> coronaries carcified.   Carotid atherosclerosis, bilateral    Carpal tunnel syndrome, bilateral    Chronic anticoagulation    a.) ASA + apixaban   COPD (chronic obstructive pulmonary disease) (HCC)    CVA (cerebral vascular accident) ([place])    Degenerative disc disease, cervical    Diastolic dysfunction    a.) TTE 05/29/2014 --> LVEF 60-65%; G1DD.   DVT (deep venous thrombosis) (HCC)    GERD (gastroesophageal reflux disease)    History of 2019 novel coronavirus disease (COVID-19) 02/08/2019   HLD (hyperlipidemia)    Hypertension    Kidney stones    Osteoarthritis of right shoulder    Pneumonia    Respiratory failure, acute ([place]) 09/20/2020   a.) severe respiratory distress 1 hour after urological surgery. CXR (+) for acute pulmonary edema. Transferred to ICU and placed on NIPPV. Questionable aspiration PNA. (+) A.fib with RVR. Improved with ABX, diuresis, and amiodarone.   Skin cancer of face    a.) RIGHT ear and RIGHT forehead; excised.   Syncope    TIA (transient ischemic attack) 2016  Valvular regurgitation    a.) TTE 05/26/2014 --> LVEF 60-65%; trivial MR, mild TR; no AR or PR. b.) TTE 04/20/2015 --> LVEF 55-60%; trivial MR and PR; no AR or TR.    Past Surgical History:  Procedure Laterality Date   CARDIAC CATHETERIZATION  2004   CARPAL TUNNEL RELEASE Right 06/11/2013   CYSTOSCOPY W/ RETROGRADES  10/16/2020   Procedure: CYSTOSCOPY WITH RETROGRADE PYELOGRAM;   Surgeon: Billey Co, MD;  Location: [facility] ORS;  Service: Urology;;   CYSTOSCOPY W/ URETERAL STENT PLACEMENT Left 09/20/2020   Procedure: CYSTOSCOPY WITH RETROGRADE PYELOGRAM/URETERAL STENT PLACEMENT;  Surgeon: Janith Lima, MD;  Location: [facility] ORS;  Service: Urology;  Laterality: Left;   CYSTOSCOPY/URETEROSCOPY/HOLMIUM LASER/STENT PLACEMENT Left 10/16/2020   Procedure: CYSTOSCOPY/URETEROSCOPY/HOLMIUM LASER/STENT PLACEMENT;  Surgeon: Billey Co, MD;  Location: [facility] ORS;  Service: Urology;  Laterality: Left;   ESOPHAGOGASTRODUODENOSCOPY N/A 11/06/2020   Procedure: ESOPHAGOGASTRODUODENOSCOPY (EGD);  Surgeon: Lucilla Lame, MD;  Location: [facility];  Service: Endoscopy;  Laterality: N/A;   SHOULDER ARTHROSCOPY WITH OPEN ROTATOR CUFF REPAIR Right 08/24/2017   Procedure: SHOULDER ARTHROSCOPY WITH OPEN ROTATOR CUFF REPAIR;  Surgeon: Corky Mull, MD;  Location: [facility] ORS;  Service: Orthopedics;  Laterality: Right;   SKIN CANCER EXCISION  12/01/2016   right ear    SKIN CANCER EXCISION     remove from the right side of the face    THROMBECTOMY Right 2004   leg   THYROIDECTOMY  1950   Not sure if total or partial thyroidectomy.    (Not in a hospital admission)  Social History   Socioeconomic History   Marital status: Legally Separated    Spouse name: Not on file   Number of children: Not on file   Years of education: Not on file   Highest education level: Not on file  Occupational History   Not on file  Tobacco Use   Smoking status: Former    Packs/day: 1.00    Years: 46.00    Pack years: 46.00    Types: Cigarettes    Start date: 01/10/1957    Quit date: 02/11/2003    Years since quitting: 17.7   Smokeless tobacco: Former    Types: Snuff    Quit date: 02/11/2003  Vaping Use   Vaping Use: Never used  Substance and Sexual Activity   Alcohol use: Not Currently    Alcohol/week: 7.0 standard drinks    Types: 7 Cans of beer per week   Drug use: No   Sexual activity: Yes     Partners: Female  Other Topics Concern   Not on file  Social History Narrative   Live with grandaughter, April   Social Determinants of Health   Financial Resource Strain: Not on file  Food Insecurity: Not on file  Transportation Needs: Not on file  Physical Activity: Not on file  Stress: Not on file  Social Connections: Not on file  Intimate Partner Violence: Not on file    Family History  Problem Relation Age of Onset   Hypertension Mother    Heart disease Mother    CAD Father    Heart attack Father       Review of systems complete and found to be negative unless listed above      PHYSICAL EXAM  General: Well developed, well nourished, in no acute distress HEENT:  Normocephalic and atramatic Neck:  No JVD.  Lungs: Clear bilaterally to auscultation and percussion. Heart: Irregularly irregular . Normal S1 and S2  without gallops or murmurs.  Abdomen: Bowel sounds are positive, abdomen soft and non-tender  Msk:  Back normal, normal gait. Normal strength and tone for age. Extremities: No clubbing, cyanosis or edema.   Neuro: Alert and oriented X 3. Psych:  Good affect, responds appropriately  Labs:   Lab Results  Component Value Date   WBC 9.2 11/11/2020   HGB 8.5 (L) 11/11/2020   HCT 26.8 (L) 11/11/2020   MCV 95.4 11/11/2020   PLT 116 (L) 11/11/2020    Recent Labs  Lab 11/11/20 1340  NA 139  K 3.3*  CL 102  CO2 28  BUN 18  CREATININE 0.91  CALCIUM 8.5*  PROT 5.7*  BILITOT 1.3*  ALKPHOS 85  ALT 27  AST 23  GLUCOSE 107*   Lab Results  Component Value Date   CKTOTAL 44 01/26/2011   CKMB < 0.5 (L) 01/26/2011   TROPONINI <0.03 05/19/2014    Lab Results  Component Value Date   CHOL 193 12/07/2016   CHOL 164 06/08/2016   CHOL 170 09/01/2015   Lab Results  Component Value Date   HDL 72 12/07/2016   HDL 63 06/08/2016   HDL 63 09/01/2015   Lab Results  Component Value Date   LDLCALC 105 (H) 12/07/2016   LDLCALC 92 06/08/2016   LDLCALC 95  09/01/2015   Lab Results  Component Value Date   TRIG 78 03/25/2019   TRIG 74 12/07/2016   TRIG 45 06/08/2016   Lab Results  Component Value Date   CHOLHDL 2.7 12/07/2016   CHOLHDL 2.6 06/08/2016   CHOLHDL 2.7 09/01/2015   No results found for: LDLDIRECT    Radiology: DG Abdomen 1 View  Result Date: 10/20/2020 CLINICAL DATA:  Sepsis.  Evaluate for stent placement. EXAM: ABDOMEN - 1 VIEW COMPARISON:  Fluoroscopy 10/16/2020 FINDINGS: A left ureteral stent is present. The proximal pigtail projects to the left of L3. This is lower than on the previous fluoroscopic image. The distal pigtail projects over the perineum below the level of the symphysis pubis. This suggests inferior migration of the stent with the distal pigtail probably either in the urethra or possibly in a prolapsed bladder. Scattered gas and stool throughout the colon and small bowel. No small or large bowel distention. No radiopaque stones are identified. Degenerative changes in the spine and hips. IMPRESSION: Left ureteral stent is present. The stent appears to have migrated inferiorly with the proximal pigtail lower than on prior study and the distal pigtail projecting below the level of the symphysis pubis, possibly in the urethra or in a prolapsed bladder. Electronically Signed   By: Lucienne Capers M.D.   On: 10/20/2020 20:18   CT HEAD WO CONTRAST (5MM)  Result Date: 11/05/2020 CLINICAL DATA:  Transient ischemic attack (TIA) EXAM: CT HEAD WITHOUT CONTRAST TECHNIQUE: Contiguous axial images were obtained from the base of the skull through the vertex without intravenous contrast. COMPARISON:  None. FINDINGS: Brain: There is no acute intracranial hemorrhage, mass effect, or edema. Gray-white differentiation is preserved. There is no extra-axial fluid collection. Ventricles and sulci are within normal limits in size and configuration. Vascular: There is atherosclerotic calcification at the skull base. Skull: Calvarium is  unremarkable. Sinuses/Orbits: No acute finding. Other: None. IMPRESSION: No acute intracranial abnormality. Electronically Signed   By: Macy Mis M.D.   On: 11/05/2020 14:20   CT ABDOMEN PELVIS W CONTRAST  Result Date: 11/03/2020 CLINICAL DATA:  Nausea and vomiting EXAM: CT ABDOMEN AND PELVIS WITH CONTRAST TECHNIQUE:  Multidetector CT imaging of the abdomen and pelvis was performed using the standard protocol following bolus administration of intravenous contrast. CONTRAST:  1103mL OMNIPAQUE IOHEXOL 300 MG/ML  SOLN COMPARISON:  CT abdomen and pelvis dated October 20, 2020 FINDINGS: Lower chest: Cardiomegaly. Emphysema. Solid pulmonary nodules of the left lower lobe. Largest measures up to 7 mm and is located on series 2, image 98. Hepatobiliary: No focal liver abnormality is seen. No gallstones, gallbladder wall thickening, or biliary dilatation. Pancreas: Unremarkable. No pancreatic ductal dilatation or surrounding inflammatory changes. Spleen: Normal in size without focal abnormality. Adrenals/Urinary Tract: Bilateral adrenal glands are unremarkable. No hydronephrosis. Bilateral nonobstructing stones, left greater than right. Bladder is unremarkable. Stomach/Bowel: Stomach is within normal limits. Appendix appears normal. Diverticulosis. No evidence of bowel wall thickening, distention, or inflammatory changes. Vascular/Lymphatic: Aortic atherosclerosis. No enlarged abdominal or pelvic lymph nodes. Reproductive: Prostatomegaly, measuring up to 5.8 cm. Other: Small right greater than left fat containing inguinal hernias. Musculoskeletal: Unchanged L4 compression deformity. No aggressive appearing osseous lesions. IMPRESSION: Diverticulosis with no evidence of diverticulitis. No acute findings abdomen or pelvis. Solid pulmonary nodules of the left lower lobe, largest measures up to 7 mm. Non-contrast chest CT at 6-12 months is recommended. If the nodule is stable at time of repeat CT, then future CT at 18-24  months (from today's scan) is considered optional for low-risk patients, but is recommended for high-risk patients. This recommendation follows the consensus statement: Guidelines for Management of Incidental Pulmonary Nodules Detected on CT Images: From the Fleischner Society 2017; Radiology 2017; 284:228-243. Aortic Atherosclerosis (ICD10-I70.0) and Emphysema (ICD10-J43.9). Electronically Signed   By: Yetta Glassman M.D.   On: 11/03/2020 15:48   DG Chest Portable 1 View  Result Date: 11/11/2020 CLINICAL DATA:  Shortness of breath EXAM: PORTABLE CHEST 1 VIEW COMPARISON:  Previous studies including the examination of 10/29/2020 FINDINGS: Transverse diameter of heart is slightly increased. Thoracic aorta is tortuous and ectatic. There is prominence of interstitial markings in the parahilar regions with interval worsening. There is faint haziness in the lateral aspect of left mid and both lower lung fields. There is a small smooth marginated radiopacity, possibly in the lateral aspect of minor fissure in the right mid lung fields. There is pleural thickening in both apices, more so on the left side. There is no pneumothorax. Lateral CP angles are clear. IMPRESSION: There is increase in interstitial markings in the parahilar regions suggesting interstitial edema or interstitial pneumonitis. Part of this finding is probably underlying scarring. There is diffuse haziness in the lateral aspect of left mid and both lower lung fields which may be due to pleural thickening or loculated pleural effusions or pneumonia. Follow-up chest radiographs and CT as clinically warranted should be considered. Electronically Signed   By: Elmer Picker M.D.   On: 11/11/2020 13:38   DG Chest Port 1 View  Result Date: 10/29/2020 CLINICAL DATA:  Shortness of breath EXAM: PORTABLE CHEST 1 VIEW COMPARISON:  10/27/2020, 10/23/2020, 08/27/2017,, CT 09/19/2018 FINDINGS: Emphysematous disease. Mild reticular opacity at the bases  likely due to fibrosis. Slight decreased interstitial opacity since 10/27/2020. No new confluent airspace disease, pleural effusion or pneumothorax. Apical pleural thickening. Stable cardiomediastinal silhouette with aortic atherosclerosis. IMPRESSION: 1. Underlying chronic lung disease/emphysema with overall decreased interstitial opacity since 10/27/2020 suggesting resolving superimposed edema or inflammatory process 2. No new confluent airspace disease Electronically Signed   By: Donavan Foil M.D.   On: 10/29/2020 15:39   DG Chest Port 1 View  Result Date: 10/27/2020  CLINICAL DATA:  Chronic hypoxemia, COPD presents with weakness and shortness of breath EXAM: PORTABLE CHEST 1 VIEW COMPARISON:  Chest radiograph 10/23/2020 FINDINGS: The cardiomediastinal silhouette is stable. There are diffusely coarsened interstitial markings throughout both lungs, overall not significantly changed since 10/23/2020. There is no new or worsening focal airspace disease. There is no pleural effusion. There is no pneumothorax. There is no acute osseous abnormality. IMPRESSION: Unchanged coarsened interstitial markings throughout both lungs since 10/23/2020 though these are increased since 10/06/2020. Findings may reflect edema or infection superimposed on chronic changes. Electronically Signed   By: Valetta Mole M.D.   On: 10/27/2020 09:05   DG Chest Port 1 View  Result Date: 10/23/2020 CLINICAL DATA:  Respiratory failure, increasing shortness of breath EXAM: PORTABLE CHEST 1 VIEW COMPARISON:  10/20/2020 FINDINGS: Unchanged mildly enlarged cardiac contour. Redemonstrated diffuse airspace and interstitial opacities bilaterally, which appear largely unchanged compared to the prior exam. No definite pleural effusion. Aortic calcifications. No acute osseous abnormality. IMPRESSION: Unchanged bilateral pulmonary opacities, which could represent edema or infection. Electronically Signed   By: Merilyn Baba M.D.   On: 10/23/2020  17:49   DG Chest Port 1 View  Result Date: 10/20/2020 CLINICAL DATA:  Questionable sepsis.  Evaluate for abnormality. EXAM: PORTABLE CHEST 1 VIEW COMPARISON:  10/06/2020 FINDINGS: Mild cardiac enlargement. Diffuse airspace and interstitial infiltration throughout both lungs, progressing since prior study. This may represent edema or multifocal pneumonia. No pleural effusions. No pneumothorax. Mediastinal contours appear intact. Calcification of the aorta. Degenerative changes in the spine and shoulders. IMPRESSION: Increasing bilateral pulmonary infiltrates, likely edema or pneumonia. Electronically Signed   By: Lucienne Capers M.D.   On: 10/20/2020 19:08   DG OR UROLOGY CYSTO IMAGE ([facility] ONLY)  Result Date: 10/16/2020 There is no interpretation for this exam.  This order is for images obtained during a surgical procedure.  Please See "Surgeries" Tab for more information regarding the procedure.   ECHOCARDIOGRAM COMPLETE  Result Date: 10/23/2020    ECHOCARDIOGRAM REPORT   Patient Name:   [name] Browning Date of Exam: 10/23/2020 Medical Rec #:  [id number]     Height:       74.0 in Accession #:    [id number]    Weight:       180.0 lb Date of Birth:  Mar 30, 1940     BSA:          2.078 m Patient Age:    3 years      BP:           133/80 mmHg Patient Gender: M             HR:           95 bpm. Exam Location:  [facility] Procedure: 2D Echo, Cardiac Doppler and Color Doppler Indications:     CHF-acute diastolic B28.41  History:         Patient has prior history of Echocardiogram examinations, most                  recent 04/20/2015. Angina, COPD and TIA; Signs/Symptoms:Syncope.  Sonographer:     Sherrie Sport Referring Phys:  [id number] [place] Diagnosing Phys: Ida Rogue MD  Sonographer Comments: Suboptimal apical window. IMPRESSIONS  1. Left ventricular ejection fraction, by estimation, is 60 to 65%. The left ventricle has normal function. The left ventricle has no regional wall motion abnormalities. Left  ventricular diastolic parameters are indeterminate.  2. Right ventricular systolic function is normal. The right ventricular size  is normal. There is mildly elevated pulmonary artery systolic pressure.  3. Rhythm is atrial fibrillation  4. Left atrial size was moderately dilated. FINDINGS  Left Ventricle: Left ventricular ejection fraction, by estimation, is 60 to 65%. The left ventricle has normal function. The left ventricle has no regional wall motion abnormalities. The left ventricular internal cavity size was normal in size. There is  no left ventricular hypertrophy. Left ventricular diastolic parameters are indeterminate. Right Ventricle: The right ventricular size is normal. No increase in right ventricular wall thickness. Right ventricular systolic function is normal. There is mildly elevated pulmonary artery systolic pressure. The tricuspid regurgitant velocity is 2.95  m/s, and with an assumed right atrial pressure of 5 mmHg, the estimated right ventricular systolic pressure is 17.6 mmHg. Left Atrium: Left atrial size was moderately dilated. Right Atrium: Right atrial size was normal in size. Pericardium: There is no evidence of pericardial effusion. Mitral Valve: The mitral valve is normal in structure. There is mild thickening of the mitral valve leaflet(s). No evidence of mitral valve regurgitation. No evidence of mitral valve stenosis. Tricuspid Valve: The tricuspid valve is normal in structure. Tricuspid valve regurgitation is mild . No evidence of tricuspid stenosis. Aortic Valve: The aortic valve is normal in structure. Aortic valve regurgitation is not visualized. No aortic stenosis is present. Aortic valve mean gradient measures 2.0 mmHg. Aortic valve peak gradient measures 4.7 mmHg. Aortic valve area, by VTI measures 3.04 cm. Pulmonic Valve: The pulmonic valve was normal in structure. Pulmonic valve regurgitation is not visualized. No evidence of pulmonic stenosis. Aorta: The aortic root is  normal in size and structure. Venous: The inferior vena cava is normal in size with greater than 50% respiratory variability, suggesting right atrial pressure of 3 mmHg. IAS/Shunts: No atrial level shunt detected by color flow Doppler.  LEFT VENTRICLE PLAX 2D LVIDd:         4.19 cm LVIDs:         2.93 cm LV PW:         1.33 cm LV IVS:        0.95 cm LVOT diam:     2.00 cm LV SV:         55 LV SV Index:   26 LVOT Area:     3.14 cm  RIGHT VENTRICLE RV Basal diam:  4.55 cm RV S prime:     7.51 cm/s TAPSE (M-mode): 3.8 cm LEFT ATRIUM              Index        RIGHT ATRIUM           Index LA diam:        3.50 cm  1.68 cm/m   RA Area:     26.60 cm LA Vol (A2C):   93.4 ml  44.94 ml/m  RA Volume:   96.30 ml  46.34 ml/m LA Vol (A4C):   128.0 ml 61.59 ml/m LA Biplane Vol: 113.0 ml 54.37 ml/m  AORTIC VALVE                    PULMONIC VALVE AV Area (Vmax):    2.83 cm     PV Vmax:        0.96 m/s AV Area (Vmean):   2.84 cm     PV Peak grad:   3.6 mmHg AV Area (VTI):     3.04 cm     RVOT Peak grad: 4 mmHg AV Vmax:  108.00 cm/s AV Vmean:          70.100 cm/s AV VTI:            0.181 m AV Peak Grad:      4.7 mmHg AV Mean Grad:      2.0 mmHg LVOT Vmax:         97.30 cm/s LVOT Vmean:        63.300 cm/s LVOT VTI:          0.175 m LVOT/AV VTI ratio: 0.97  AORTA Ao Root diam: 3.30 cm MITRAL VALVE                TRICUSPID VALVE MV Area (PHT): 4.52 cm     TR Peak grad:   34.8 mmHg MV Decel Time: 168 msec     TR Vmax:        295.00 cm/s MV E velocity: 134.00 cm/s                             SHUNTS                             Systemic VTI:  0.18 m                             Systemic Diam: 2.00 cm Ida Rogue MD Electronically signed by Ida Rogue MD Signature Date/Time: 10/23/2020/5:30:24 PM    Final    CT Renal Stone Study  Result Date: 10/20/2020 CLINICAL DATA:  Intermittent weakness, feeling as if he would faint. EXAM: CT ABDOMEN AND PELVIS WITHOUT CONTRAST TECHNIQUE: Multidetector CT imaging of the  abdomen and pelvis was performed following the standard protocol without IV contrast. COMPARISON:  September 18, 2020 FINDINGS: Lower chest: Moderate severity chronic appearing fibrotic changes are seen involving the bilateral lung bases. Small bilateral pleural effusions are noted. Hepatobiliary: No focal liver abnormality is seen. No gallstones, gallbladder wall thickening, or biliary dilatation. Pancreas: Unremarkable. No pancreatic ductal dilatation or surrounding inflammatory changes. Spleen: A punctate calcified granuloma is seen within an otherwise normal-appearing spleen. Adrenals/Urinary Tract: Adrenal glands are unremarkable. Kidneys are normal in size, without focal lesions. A 2 mm nonobstructing renal stone is seen within the upper pole of the right kidney. Clusters of subcentimeter nonobstructing renal stones are seen within the mid and lower left kidney. A left-sided endo ureteral stent is in place. The proximal portion of the stent is seen within the proximal left ureter, just beyond the left UPJ. The distal end extends into the urinary bladder, continues through the prostate gland in serpentine fashion to the proximal to mid portion of the urethra (axial CT image 97, CT series 2). The urinary bladder is empty and subsequently limited in evaluation. Stomach/Bowel: Stomach is within normal limits. Appendix appears normal. No evidence of bowel dilatation. Numerous diverticula are seen within the descending and sigmoid colon. Vascular/Lymphatic: Aortic atherosclerosis. No enlarged abdominal or pelvic lymph nodes. Reproductive: The prostate gland is moderately enlarged. A moderate amount of prostate gland calcification is seen. Other: A 4.4 cm x 2.7 cm fat containing right inguinal hernia is seen. No abdominopelvic ascites. Musculoskeletal: Multilevel degenerative changes seen throughout the lumbar spine. IMPRESSION: 1. Left-sided endo ureteral stent which may be malpositioned, as described above.  Confirmation of the desired positioning is recommended. 2. Bilateral nonobstructing renal stones. 3. Small bilateral pleural effusions.  4. Colonic diverticulosis. Aortic Atherosclerosis (ICD10-I70.0). Electronically Signed   By: Virgina Norfolk M.D.   On: 10/20/2020 21:54    EKG: A. fib, mild inferolateral ST depressions.  Echo 10/23/20-  1. Left ventricular ejection fraction, by estimation, is 60 to 65%. The  left ventricle has normal function. The left ventricle has no regional  wall motion abnormalities. Left ventricular diastolic parameters are  indeterminate.   2. Right ventricular systolic function is normal. The right ventricular  size is normal. There is mildly elevated pulmonary artery systolic  pressure.   3. Rhythm is atrial fibrillation   4. Left atrial size was moderately dilated.  ASSESSMENT AND PLAN:  [name] Browning is a 80 year old male with history of CAD (nonobstructive calcified coronary disease), atrial fibrillation on Eliquis, HFpEF, hypertension, TIA in 2016, COPD on 2 L who was admitted to the hospital with shortness of breath.  Cardiology is consulted for evaluation of an elevated troponin.  # Elevated troponin #History of atrial fibrillation #Respiratory failure The patient is presenting with several days of progressive shortness of breath, on top of a baseline history of COPD requiring 2 L of oxygen.  His BNP is 220 which is not substantially above his baseline.  In regards to his troponin, he has anemia compared to his baseline with a hemoglobin of 8.2; his hemoglobin on 11/03/2020 was 12.9. Ultimately I do not think his troponin elevation is d/t a plaque rupture event and is more likely from demand related to anemia, and volume overload from stopping his home metoprolol and lasix at discharge. - Hold home eliquis while bleeding; no indication for heparin. - Recommend aspirin 81 mg daily when able. - Trend troponin until peak - Maintain on telemetry - Can try lasix  to see if this helps respiratory status. Lasix 20 mg IV BID ordered.  - Resume home metoprolol XL 25 mg daily when it is apparent he is not having brisk bleeding. - Hold on repeat echocardiogram for now  Signed: Andrez Grime MD 11/11/2020, 5:01 PM

## 2020-11-11 NOTE — H&P (Signed)
History and Physical    [name] Browning DOB: 09/26/1940 DOA: 11/11/2020  Referring MD/NP/PA:   PCP: [facility]   Patient coming from:  The patient is coming from home.  At baseline, pt is independent for most of ADL.        Chief Complaint: SOB and dark stool  HPI: [name] Browning is a 80 y.o. male with medical history significant of dCHF,  HLD, HLD, DVT and A fib on Eliquis, COPD on 2 L oxygen, stroke, TIA, GERD, anxiety, CAD, carotid artery stenosis, kidney stone, syncope, iron deficiency anemia, who presents with shortness of breath.  Patient was recently hospitalized from 10/25-2010/28 due to dysphagia. Pt was seen by speech therapy with no signs of aspiration.  Gastroenterology was consulted and underwent EGD with no significant findings. Pt states that has SOB in the past several days, which has been progressively worsening.  He states that he had some chest pain yesterday, which has resolved.  Currently patient does not have chest pain.  No nausea, vomiting, diarrhea or abdominal pain.  No symptoms of UTI.  Patient states that he has intermittent dark stool recently.  Patient was initially found to have oxygen desaturation to 88% on home level 2 L oxygen in ED, 4 L oxygen was started with some improvement.  ED Course: pt was found to have troponin level 620, BNP 220, hemoglobin 8.2 (9.7 on 11/06/2020), positive FOBT, potassium 3.3, GFR >60, negative COVID PCR, temperature normal, blood pressure 107/65, heart rate 92, RR 24, chest x-ray showed interstitial edema.  Patient is admitted to progressive bed as inpatient  Review of Systems:   General: no fevers, chills, no body weight gain, has fatigue HEENT: no blurry vision, hearing changes or sore throat Respiratory: has dyspnea, coughing, no wheezing CV: had chest pain, no palpitations GI: no nausea, vomiting, abdominal pain, diarrhea, constipation. Has dark stool. GU: no dysuria, burning on urination, increased  urinary frequency, hematuria  Ext: has leg edema Neuro: no unilateral weakness, numbness, or tingling, no vision change or hearing loss Skin: no rash, no skin tear. MSK: No muscle spasm, no deformity, no limitation of range of movement in spin Heme: No easy bruising.  Travel history: No recent long distant travel.  Allergy:  Allergies  Allergen Reactions   Hydromorphone     Hallucination Pt reports he doesn't know about this    Past Medical History:  Diagnosis Date   Anginal pain ([place])    Anxiety    Aortic atherosclerosis ([place])    Atrial fibrillation ([place]) 01/2019   Bilateral carpal tunnel syndrome    CAD (coronary artery disease)    a.) LHC 2004 --> normal coronaries. b.) normal stress test in 2007 and 2011; c.) Lexiscan 05/20/2014 --> LVEF 55-65%; no significant stress induced ischemia/arrythmia. d.) CT chest 03/25/2019 --> coronaries carcified.   Carotid atherosclerosis, bilateral    Carpal tunnel syndrome, bilateral    Chronic anticoagulation    a.) ASA + apixaban   COPD (chronic obstructive pulmonary disease) (HCC)    CVA (cerebral vascular accident) ([place])    Degenerative disc disease, cervical    Diastolic dysfunction    a.) TTE 05/29/2014 --> LVEF 60-65%; G1DD.   DVT (deep venous thrombosis) (HCC)    GERD (gastroesophageal reflux disease)    History of 2019 novel coronavirus disease (COVID-19) 02/08/2019   HLD (hyperlipidemia)    Hypertension    Kidney stones    Osteoarthritis of right shoulder    Pneumonia  Respiratory failure, acute ([place]) 09/20/2020   a.) severe respiratory distress 1 hour after urological surgery. CXR (+) for acute pulmonary edema. Transferred to ICU and placed on NIPPV. Questionable aspiration PNA. (+) A.fib with RVR. Improved with ABX, diuresis, and amiodarone.   Skin cancer of face    a.) RIGHT ear and RIGHT forehead; excised.   Syncope    TIA (transient ischemic attack) 2016   Valvular regurgitation    a.) TTE 05/26/2014 --> LVEF  60-65%; trivial MR, mild TR; no AR or PR. b.) TTE 04/20/2015 --> LVEF 55-60%; trivial MR and PR; no AR or TR.    Past Surgical History:  Procedure Laterality Date   CARDIAC CATHETERIZATION  2004   CARPAL TUNNEL RELEASE Right 06/11/2013   CYSTOSCOPY W/ RETROGRADES  10/16/2020   Procedure: CYSTOSCOPY WITH RETROGRADE PYELOGRAM;  Surgeon: Billey Co, MD;  Location: [facility] ORS;  Service: Urology;;   CYSTOSCOPY W/ URETERAL STENT PLACEMENT Left 09/20/2020   Procedure: CYSTOSCOPY WITH RETROGRADE PYELOGRAM/URETERAL STENT PLACEMENT;  Surgeon: Janith Lima, MD;  Location: [facility] ORS;  Service: Urology;  Laterality: Left;   CYSTOSCOPY/URETEROSCOPY/HOLMIUM LASER/STENT PLACEMENT Left 10/16/2020   Procedure: CYSTOSCOPY/URETEROSCOPY/HOLMIUM LASER/STENT PLACEMENT;  Surgeon: Billey Co, MD;  Location: [facility] ORS;  Service: Urology;  Laterality: Left;   ESOPHAGOGASTRODUODENOSCOPY N/A 11/06/2020   Procedure: ESOPHAGOGASTRODUODENOSCOPY (EGD);  Surgeon: Lucilla Lame, MD;  Location: [facility] ENDOSCOPY;  Service: Endoscopy;  Laterality: N/A;   SHOULDER ARTHROSCOPY WITH OPEN ROTATOR CUFF REPAIR Right 08/24/2017   Procedure: SHOULDER ARTHROSCOPY WITH OPEN ROTATOR CUFF REPAIR;  Surgeon: Corky Mull, MD;  Location: [facility] ORS;  Service: Orthopedics;  Laterality: Right;   SKIN CANCER EXCISION  12/01/2016   right ear    SKIN CANCER EXCISION     remove from the right side of the face    THROMBECTOMY Right 2004   leg   THYROIDECTOMY  1950   Not sure if total or partial thyroidectomy.    Social History:  reports that he quit smoking about 17 years ago. His smoking use included cigarettes. He started smoking about 63 years ago. He has a 46.00 pack-year smoking history. He quit smokeless tobacco use about 17 years ago.  His smokeless tobacco use included snuff. He reports that he does not currently use alcohol after a past usage of about 7.0 standard drinks per week. He reports that he does not use drugs.  Family History:   Family History  Problem Relation Age of Onset   Hypertension Mother    Heart disease Mother    CAD Father    Heart attack Father      Prior to Admission medications   Medication Sig Start Date End Date Taking? Authorizing Provider  acetaminophen (TYLENOL) 500 MG tablet Take 2 tablets (1,000 mg total) by mouth every 6 (six) hours as needed for mild pain. 05/07/20   Olean Ree, MD  apixaban (ELIQUIS) 5 MG TABS tablet Take 1 tablet (5 mg total) by mouth 2 (two) times daily. 09/28/20 10/28/20  Pokhrel, Corrie Mckusick, MD  aspirin EC 81 MG tablet Take 81 mg by mouth daily after supper.    [provider]  atorvastatin (LIPITOR) 40 MG tablet Take 40 mg by mouth daily after supper.    [provider]  cetirizine (ZYRTEC) 10 MG tablet Take 10 mg by mouth daily. 09/29/20   [provider]  famotidine (PEPCID) 20 MG tablet One after supper 10/06/20   Tanda Rockers, MD  Ferrous Gluconate (IRON) 240 (27 Fe) MG  TABS Take 27 mg by mouth daily after supper. 09/21/18   [provider]  guaiFENesin-dextromethorphan (ROBITUSSIN DM) 100-10 MG/5ML syrup Take 5 mLs by mouth every 4 (four) hours as needed for cough. 09/28/20   Pokhrel, Corrie Mckusick, MD  montelukast (SINGULAIR) 10 MG tablet Take 10 mg by mouth at bedtime.    [provider]  pantoprazole (PROTONIX) 40 MG tablet Take 40 mg by mouth daily after supper. 12/10/18   [provider]  phenazopyridine (PYRIDIUM) 200 MG tablet Take 1 tablet (200 mg total) by mouth 3 (three) times daily as needed (bladder pain). 09/28/20   Pokhrel, Corrie Mckusick, MD  polyethylene glycol (MIRALAX / GLYCOLAX) 17 g packet Take 17 g by mouth daily as needed for mild constipation or moderate constipation. Patient taking differently: Take 17 g by mouth daily. 09/28/20   Pokhrel, Corrie Mckusick, MD  SYMBICORT 160-4.5 MCG/ACT inhaler Inhale 2 puffs into the lungs 2 (two) times daily as needed (shortness of breath). 12/07/18   [provider]   tamsulosin (FLOMAX) 0.4 MG CAPS capsule TAKE 1 CAPSULE BY MOUTH EVERY DAY 11/09/20   Billey Co, MD    Physical Exam: Vitals:   11/11/20 1430 11/11/20 1500 11/11/20 1530 11/11/20 1600  BP: 107/65 110/63 110/85 113/71  Pulse: 85 79 83 81  Resp: (!) 26 20 (!) 25 (!) 28  Temp:      TempSrc:      SpO2: 100% 100% 95% 92%  Weight:      Height:       General: Not in acute distress HEENT:       Eyes: PERRL, EOMI, no scleral icterus.       ENT: No discharge from the ears and nose, no pharynx injection, no tonsillar enlargement.        Neck: Has positive JVD, no bruit, no mass felt. Heme: No neck lymph node enlargement. Cardiac: S1/S2, RRR, No murmurs, No gallops or rubs. Respiratory: No rales, wheezing, rhonchi or rubs. GI: Soft, nondistended, nontender, no rebound pain, no organomegaly, BS present. GU: No hematuria Ext: has 2+ pitting leg edema bilaterally. 1+DP/PT pulse bilaterally. Musculoskeletal: No joint deformities, No joint redness or warmth, no limitation of ROM in spin. Skin: No rashes.  Neuro: Alert, oriented X3, cranial nerves II-XII grossly intact, moves all extremities normally. Psych: Patient is not psychotic, no suicidal or hemocidal ideation.  Labs on Admission: I have personally reviewed following labs and imaging studies  CBC: Recent Labs  Lab 11/05/20 0437 11/06/20 0523 11/11/20 1340 11/11/20 1538  WBC 12.2* 10.2 9.3 9.2  HGB 10.7* 9.7* 8.2* 8.5*  HCT 33.1* 29.5* 26.3* 26.8*  MCV 94.3 96.1 95.6 95.4  PLT 138* 128* 119* 378*   Basic Metabolic Panel: Recent Labs  Lab 11/05/20 0437 11/06/20 0523 11/11/20 1340 11/11/20 1527  NA 139 140 139  --   K 3.7 3.4* 3.3*  --   CL 107 111 102  --   CO2 25 26 28   --   GLUCOSE 92 106* 107*  --   BUN 28* 21 18  --   CREATININE 0.97 0.81 0.91  --   CALCIUM 8.0* 7.5* 8.5*  --   MG 1.9 1.7  --  2.0   GFR: Estimated Creatinine Clearance: 76.5 mL/min (by C-G formula based on SCr of 0.91 mg/dL). Liver  Function Tests: Recent Labs  Lab 11/11/20 1340  AST 23  ALT 27  ALKPHOS 85  BILITOT 1.3*  PROT 5.7*  ALBUMIN 2.9*   No results  for input(s): LIPASE, AMYLASE in the last 168 hours. No results for input(s): AMMONIA in the last 168 hours. Coagulation Profile: Recent Labs  Lab 11/11/20 1516  INR 1.5*   Cardiac Enzymes: No results for input(s): CKTOTAL, CKMB, CKMBINDEX, TROPONINI in the last 168 hours. BNP (last 3 results) No results for input(s): PROBNP in the last 8760 hours. HbA1C: No results for input(s): HGBA1C in the last 72 hours. CBG: No results for input(s): GLUCAP in the last 168 hours. Lipid Profile: No results for input(s): CHOL, HDL, LDLCALC, TRIG, CHOLHDL, LDLDIRECT in the last 72 hours. Thyroid Function Tests: No results for input(s): TSH, T4TOTAL, FREET4, T3FREE, THYROIDAB in the last 72 hours. Anemia Panel: No results for input(s): VITAMINB12, FOLATE, FERRITIN, TIBC, IRON, RETICCTPCT in the last 72 hours. Urine analysis:    Component Value Date/Time   COLORURINE YELLOW (A) 11/03/2020 2026   APPEARANCEUR HAZY (A) 11/03/2020 2026   APPEARANCEUR Cloudy (A) 10/08/2020 1545   LABSPEC >1.046 (H) 11/03/2020 2026   LABSPEC 1.014 08/10/2013 1825   PHURINE 5.0 11/03/2020 2026   GLUCOSEU NEGATIVE 11/03/2020 2026   GLUCOSEU Negative 08/10/2013 1825   HGBUR LARGE (A) 11/03/2020 2026   BILIRUBINUR NEGATIVE 11/03/2020 2026   BILIRUBINUR Negative 10/08/2020 1545   BILIRUBINUR 1+ 08/10/2013 [place] 11/03/2020 2026   PROTEINUR 30 (A) 11/03/2020 2026   NITRITE NEGATIVE 11/03/2020 2026   LEUKOCYTESUR NEGATIVE 11/03/2020 2026   LEUKOCYTESUR Negative 08/10/2013 1825   Sepsis Labs: @LABRCNTIP (procalcitonin:4,lacticidven:4) ) Recent Results (from the past 240 hour(s))  Blood culture (single)     Status: None   Collection Time: 11/03/20  1:26 PM   Specimen: BLOOD  Result Value Ref Range Status   Specimen Description BLOOD RIGHT AC  Final   Special  Requests   Final    BOTTLES DRAWN AEROBIC AND ANAEROBIC Blood Culture adequate volume   Culture   Final    NO GROWTH 5 DAYS Performed at [facility], [address] [place], [place] [address]    Report Status 11/08/2020 FINAL  Final  Resp Panel by RT-PCR (Flu A&B, Covid) Nasopharyngeal Swab     Status: None   Collection Time: 11/03/20  1:26 PM   Specimen: Nasopharyngeal Swab; Nasopharyngeal(NP) swabs in vial transport medium  Result Value Ref Range Status   SARS Coronavirus 2 by RT PCR NEGATIVE NEGATIVE Final    Comment: (NOTE) SARS-CoV-2 target nucleic acids are NOT DETECTED.  The SARS-CoV-2 RNA is generally detectable in upper respiratory specimens during the acute phase of infection. The lowest concentration of SARS-CoV-2 viral copies this assay can detect is 138 copies/mL. A negative result does not preclude SARS-Cov-2 infection and should not be used as the sole basis for treatment or other patient management decisions. A negative result may occur with  improper specimen collection/handling, submission of specimen other than nasopharyngeal swab, presence of viral mutation(s) within the areas targeted by this assay, and inadequate number of viral copies(<138 copies/mL). A negative result must be combined with clinical observations, patient history, and epidemiological information. The expected result is Negative.  Fact Sheet for Patients:  EntrepreneurPulse.com.au  Fact Sheet for Healthcare Providers:  IncredibleEmployment.be  This test is no t yet approved or cleared by the [place] FDA and  has been authorized for detection and/or diagnosis of SARS-CoV-2 by FDA under an Emergency Use Authorization (EUA). This EUA will remain  in effect (meaning this test can be used) for the duration of the COVID-19 declaration under Section 564(b)(1) of  the Act, 21 U.S.C.section 360bbb-3(b)(1), unless the authorization is terminated   or revoked sooner.       Influenza A by PCR NEGATIVE NEGATIVE Final   Influenza B by PCR NEGATIVE NEGATIVE Final    Comment: (NOTE) The Xpert Xpress SARS-CoV-2/FLU/RSV plus assay is intended as an aid in the diagnosis of influenza from Nasopharyngeal swab specimens and should not be used as a sole basis for treatment. Nasal washings and aspirates are unacceptable for Xpert Xpress SARS-CoV-2/FLU/RSV testing.  Fact Sheet for Patients: EntrepreneurPulse.com.au  Fact Sheet for Healthcare Providers: IncredibleEmployment.be  This test is not yet approved or cleared by the [place] FDA and has been authorized for detection and/or diagnosis of SARS-CoV-2 by FDA under an Emergency Use Authorization (EUA). This EUA will remain in effect (meaning this test can be used) for the duration of the COVID-19 declaration under Section 564(b)(1) of the Act, 21 U.S.C. section 360bbb-3(b)(1), unless the authorization is terminated or revoked.  Performed at [facility], [address] [place], [place] [address]   Blood culture (single)     Status: None   Collection Time: 11/03/20  2:30 PM   Specimen: BLOOD  Result Value Ref Range Status   Specimen Description BLOOD LEFT ANTECUBITAL  Final   Special Requests   Final    BOTTLES DRAWN AEROBIC AND ANAEROBIC Blood Culture results may not be optimal due to an excessive volume of blood received in culture bottles   Culture   Final    NO GROWTH 5 DAYS Performed at [facility], [address] [place], [place] [address]    Report Status 11/08/2020 FINAL  Final  Urine Culture     Status: None   Collection Time: 11/03/20  8:26 PM   Specimen: Urine, Clean Catch  Result Value Ref Range Status   Specimen Description   Final    URINE, CLEAN CATCH Performed at [facility], [address] [place], [place] [address]    Special Requests   Final    NONE Performed at [facility], [address], [place], [place] [address]    Culture   Final    NO GROWTH Performed at [facility], [place] [address] [place],  [address]    Report Status 11/04/2020 FINAL  Final  Resp Panel by RT-PCR (Flu A&B, Covid) Nasopharyngeal Swab     Status: None   Collection Time: 11/11/20  1:40 PM   Specimen: Nasopharyngeal Swab; Nasopharyngeal(NP) swabs in vial transport medium  Result Value Ref Range Status   SARS Coronavirus 2 by RT PCR NEGATIVE NEGATIVE Final    Comment: (NOTE) SARS-CoV-2 target nucleic acids are NOT DETECTED.  The SARS-CoV-2 RNA is generally detectable in upper respiratory specimens during the acute phase of infection. The lowest concentration of SARS-CoV-2 viral copies this assay can detect is 138 copies/mL. A negative result does not preclude SARS-Cov-2 infection and should not be used as the sole basis for treatment or other patient management decisions. A negative result may occur with  improper specimen collection/handling, submission of specimen other than nasopharyngeal swab, presence of viral mutation(s) within the areas targeted by this assay, and inadequate number of viral copies(<138 copies/mL). A negative result must be combined with clinical observations, patient history, and epidemiological information. The expected result is Negative.  Fact Sheet for Patients:  EntrepreneurPulse.com.au  Fact Sheet for Healthcare Providers:  IncredibleEmployment.be  This test is no t yet approved or cleared by the [place] and  has been authorized for detection and/or diagnosis of SARS-CoV-2 by FDA under an Emergency Use Authorization (EUA). This EUA will remain  in effect (meaning this test can be used) for the duration of the COVID-19 declaration under Section 564(b)(1) of the Act, 21 U.S.C.section 360bbb-3(b)(1), unless the authorization is terminated  or revoked sooner.       Influenza  A by PCR NEGATIVE NEGATIVE Final   Influenza B by PCR NEGATIVE NEGATIVE Final    Comment: (NOTE) The Xpert Xpress SARS-CoV-2/FLU/RSV plus assay is intended as an aid in the diagnosis of influenza from Nasopharyngeal swab specimens and should not be used as a sole basis for treatment. Nasal washings and aspirates are unacceptable for Xpert Xpress SARS-CoV-2/FLU/RSV testing.  Fact Sheet for Patients: EntrepreneurPulse.com.au  Fact Sheet for Healthcare Providers: IncredibleEmployment.be  This test is not yet approved or cleared by the [place] FDA and has been authorized for detection and/or diagnosis of SARS-CoV-2 by FDA under an Emergency Use Authorization (EUA). This EUA will remain in effect (meaning this test can be used) for the duration of the COVID-19 declaration under Section 564(b)(1) of the Act, 21 U.S.C. section 360bbb-3(b)(1), unless the authorization is terminated or revoked.  Performed at [facility], [address] [place], [place] [address]      Radiological Exams on Admission: DG Chest Portable 1 View  Result Date: 11/11/2020 CLINICAL DATA:  Shortness of breath EXAM: PORTABLE CHEST 1 VIEW COMPARISON:  Previous studies including the examination of 10/29/2020 FINDINGS: Transverse diameter of heart is slightly increased. Thoracic aorta is tortuous and ectatic. There is prominence of interstitial markings in the parahilar regions with interval worsening. There is faint haziness in the lateral aspect of left mid and both lower lung fields. There is a small smooth marginated radiopacity, possibly in the lateral aspect of minor fissure in the right mid lung fields. There is pleural thickening in both apices, more so on the left side. There is no pneumothorax. Lateral CP angles are clear. IMPRESSION: There is increase in interstitial markings in the parahilar regions suggesting interstitial edema or interstitial pneumonitis.  Part of this finding is probably underlying scarring. There is diffuse haziness in the lateral aspect of left mid and both lower lung fields which may be due to pleural thickening or loculated pleural effusions or pneumonia. Follow-up chest radiographs and CT as clinically warranted should be considered. Electronically Signed   By: Elmer Picker M.D.   On: 11/11/2020 13:38     EKG: I have personally reviewed.  Atrial fibrillation, QTC 497, poor R wave progression, ST depression in lateral leads and V4-V6.  Assessment/Plan Principal Problem:   Acute on chronic diastolic CHF (congestive heart failure) (HCC) Active Problems:   HTN (hypertension)   COPD, moderate (HCC)   Acute on chronic respiratory failure with hypoxia (HCC)   Atrial fibrillation (HCC)   DVT (deep venous thrombosis) (HCC)   HLD (hyperlipidemia)   Stroke (HCC)   CAD (coronary artery disease)   NSTEMI (non-ST elevated myocardial infarction) (HCC)   Hypokalemia   Thrombocytopenia (HCC)   Iron deficiency anemia  Acute on chronic respiratory failure with hypoxia due to acute on chronic diastolic CHF (congestive heart failure) ([place]): 2D echo on 10/23/2020 showed EF CT density 5%.  Patient has a 2+ leg edema, elevated BNP 220, positive JVD, clinically consistent with CHF exacerbation.  Due to possible GI bleeding, will start gentle diuretic treatment.   -Will admit to progressive unit as inpatient -Lasix 20 mg bid by  IV -Daily weights -strict I/O's -Low salt diet -Fluid restriction -Obtain REDs Vest reading  CAD (coronary artery disease) and NSTEMI (non-ST elevated myocardial infarction) ([place]): trop 620.  Currently no chest pain.  Possible non-STEMI versus demand ischemia.  It is a very difficult situation.  Patient has possible GI bleeding, cannot use IV heparin.  Consulted Dr. Hermenia Fiscal of cardiology. -Trend troponin -Lipitor -Hold aspirin -Check A1c, FLP -As needed nitroglycerin  HTN (hypertension): -pt is on  Lasix as above -IV controls)  COPD, moderate ([place]): No wheezing or rhonchi on auscultation. -Bronchodilators  Atrial fibrillation (HCC) -Hold Eliquis due to GIB -cardiac monitoring  DVT (deep venous thrombosis) [facility]): Patient states that he has history of right leg DVT. -Hold Eliquis due to GI bleeding -will do lower extremity Doppler to evaluate for DVT.  If patient has persistent DVT, may need to consult VVS for green filter placement.  HLD (hyperlipidemia) -Lipitor  Stroke (HCC) -Hold ASA and Eliquis due to GIB -on lipitgor  Hypokalemia: Potassium 3.3 -Related to potassium -Check a magnesium level  Thrombocytopenia ([place]): This is chronic issue.  Recent platelet of 128 on 11/01/2020.  His platelet is 119 today -Follow-up with CBC  Iron deficiency anemia and possible GI bleeding: Patient has dark stool.  FOBT positive.  Hemoglobin dropped from 9.7-8.2 today.  Consulted Dr. Alice Reichert of GI -Hold Eliquis and aspirin -Check CBC every 6 hours -Started Protonix 40 mg IV twice daily -INR/PTT/type screen -Will transfuse blood if hemoglobin < 7.0     DVT ppx: SCD ( apply SCD only to left leg. He has hx of right left DVT per pt) Code Status: Full code Family Communication:  Yes, patient's granddaughter and daughter at bed side Disposition Plan:  Anticipate discharge back to previous environment Consults called:  Dr. Corky Sox of card and Dr. Alice Reichert of GI Admission status and Level of care: Progressive Cardiac:    as inpt       Status is: Inpatient  Remains inpatient appropriate because: Patient has multiple comorbidities, now presents with acute on chronic respiratory failure with hypoxia, likely due to combination of dCHF and COPD exacerbation.  Patient also has elevated troponin 620.  Also has possible GI bleeding, hemoglobin dropped from 9.7 to 8.2.  His presentation is highly complicated.  Patient is at high risk of deteriorating.  Will need to be treated in the hospital for at  least 2 days.        Date of Service 11/11/2020    Ivor Costa [facility]   If 7PM-7AM, please contact night-coverage [email or web address] 11/11/2020, 5:48 PM

## 2020-11-11 NOTE — ED Triage Notes (Signed)
Increased SOB since being dc'd from hospital on 10/28. Pt states worse with exertion. Pt is on 2l/[place] at home.. 100% on 2l at this time

## 2020-11-11 NOTE — Consult Note (Signed)
GI Inpatient Consult Note  Reason for Consult: Anemia, Heme positive stool   Attending Requesting Consult:  History of Present Illness: [name] Browning is a 80 y.o. male seen for evaluation of anemia and heme positive stool at the request of admitting physician - Dr. Ivor Costa. Pt has a PMH of HTN, HLD, atrial fibrillation on Eliquis, Hx of DVT, COPD on 2L O2, Hx of CVA, GERD, anxiety, CAD, diastolic CHF, carotid artery stenosis, and IDA. He presented to the [facility] ED from home since afternoon for chief complaint of progressive shortness of breath. He was recently discharged from hospital 10/28 and a lot of his medications were changed. He was admitted 10/11 - 10/22 last month for respiratory failure with COPD exacerbation and pneumonia and obstructive left renal stone. He developed bacteremia as blood cultures were positive for Enterococcus faecalis and staph hemolyticus and was treated with antibiotics. He was then admitted 10/25 -10/28 for complaints of dysphagia. He underwent evaluation by gastroenterology and saw Dr. Allen Norris where EGD was performed 10/28 which showed normal esophagus, stomach, and duodenum with no specimens collected. Since discharge he complains of progressive shortness of breath, increased respiratory effort, and dark colored stools. Upon presentation to the ED, he was found to have oxygen saturation 88% on his home regimen of 2L O2. Labs were also significant for troponin 620, BNP 220, and hemoglobin 8.2 (was 9.7 last week on discharge). Chest x-ray showed interstitial edema. ED physician reported light brown stool which was guaiac positive. Heparin was not administered. GI was consulted in context of anemia and heme positive stool.    Past Medical History:  Past Medical History:  Diagnosis Date   Anginal pain ([place])    Anxiety    Aortic atherosclerosis ([place])    Atrial fibrillation ([place]) 01/2019   Bilateral carpal tunnel syndrome    CAD (coronary artery disease)    a.) LHC 2004  --> normal coronaries. b.) normal stress test in 2007 and 2011; c.) Lexiscan 05/20/2014 --> LVEF 55-65%; no significant stress induced ischemia/arrythmia. d.) CT chest 03/25/2019 --> coronaries carcified.   Carotid atherosclerosis, bilateral    Carpal tunnel syndrome, bilateral    Chronic anticoagulation    a.) ASA + apixaban   COPD (chronic obstructive pulmonary disease) (HCC)    CVA (cerebral vascular accident) ([place])    Degenerative disc disease, cervical    Diastolic dysfunction    a.) TTE 05/29/2014 --> LVEF 60-65%; G1DD.   DVT (deep venous thrombosis) (HCC)    GERD (gastroesophageal reflux disease)    History of 2019 novel coronavirus disease (COVID-19) 02/08/2019   HLD (hyperlipidemia)    Hypertension    Kidney stones    Osteoarthritis of right shoulder    Pneumonia    Respiratory failure, acute ([place]) 09/20/2020   a.) severe respiratory distress 1 hour after urological surgery. CXR (+) for acute pulmonary edema. Transferred to ICU and placed on NIPPV. Questionable aspiration PNA. (+) A.fib with RVR. Improved with ABX, diuresis, and amiodarone.   Skin cancer of face    a.) RIGHT ear and RIGHT forehead; excised.   Syncope    TIA (transient ischemic attack) 2016   Valvular regurgitation    a.) TTE 05/26/2014 --> LVEF 60-65%; trivial MR, mild TR; no AR or PR. b.) TTE 04/20/2015 --> LVEF 55-60%; trivial MR and PR; no AR or TR.    Problem List: Patient Active Problem List   Diagnosis Date Noted   Acute on chronic diastolic CHF (congestive heart failure) ([place]) 11/11/2020  DVT (deep venous thrombosis) (HCC)    HLD (hyperlipidemia)    Stroke [facility])    CAD (coronary artery disease)    NSTEMI (non-ST elevated myocardial infarction) (HCC)    Hypokalemia    Thrombocytopenia (HCC)    Iron deficiency anemia    Dysphagia    Lactic acidosis    Atrial fibrillation (HCC)    Septic shock ([place]) 10/21/2020   Acute on chronic respiratory failure with hypoxia ([place]) 10/20/2020   Community  acquired pneumonia 10/20/2020   Hypotension 10/20/2020   DOE (dyspnea on exertion) 10/06/2020   Chronic respiratory failure with hypoxia ([place]) 10/06/2020   Malnutrition of moderate degree 09/21/2020   Urinary tract obstruction by kidney stone 09/19/2020   AKI (acute kidney injury) ([place]) 09/19/2020   COPD exacerbation ([place]) 09/18/2020   Acute respiratory failure with hypoxia ([place]) 09/18/2020   AF (paroxysmal atrial fibrillation) ([place]) 58/09/9831   Umbilical hernia without obstruction and without gangrene    Pneumonia 03/25/2019   History of 2019 novel coronavirus disease (COVID-19) 03/06/2019   COVID-19 virus infection 02/08/2019   New onset atrial fibrillation ([place]) 02/08/2019   COPD, moderate ([place]) 09/14/2018   Degenerative tear of glenoid labrum of right shoulder 08/24/2017   Injury of tendon of long head of right biceps 08/21/2017   Rotator cuff tendinitis, right 08/21/2017   Traumatic complete tear of right rotator cuff 08/21/2017   Chronic neck pain 03/09/2017   Degenerative disc disease, cervical 03/09/2017   Perennial allergic rhinitis 03/09/2017   Hyperglycemia 09/01/2015   Elevated rheumatoid factor 04/08/2015   Arthritis of both hands 04/01/2015   Bilateral hand pain 03/18/2015   Elevated alkaline phosphatase level 03/02/2015   Anxiety 02/16/2015   Intermittent chest pain 02/16/2015   History of TIA (transient ischemic attack) 05/18/2014   HTN (hypertension) 05/18/2014   Hyperlipidemia 05/18/2014   Status post carpal tunnel release 06/26/2013    Past Surgical History: Past Surgical History:  Procedure Laterality Date   CARDIAC CATHETERIZATION  2004   CARPAL TUNNEL RELEASE Right 06/11/2013   CYSTOSCOPY W/ RETROGRADES  10/16/2020   Procedure: CYSTOSCOPY WITH RETROGRADE PYELOGRAM;  Surgeon: Billey Co, MD;  Location: [facility] ORS;  Service: Urology;;   CYSTOSCOPY W/ URETERAL STENT PLACEMENT Left 09/20/2020   Procedure: CYSTOSCOPY WITH RETROGRADE PYELOGRAM/URETERAL  STENT PLACEMENT;  Surgeon: Janith Lima, MD;  Location: [facility] ORS;  Service: Urology;  Laterality: Left;   CYSTOSCOPY/URETEROSCOPY/HOLMIUM LASER/STENT PLACEMENT Left 10/16/2020   Procedure: CYSTOSCOPY/URETEROSCOPY/HOLMIUM LASER/STENT PLACEMENT;  Surgeon: Billey Co, MD;  Location: [facility] ORS;  Service: Urology;  Laterality: Left;   ESOPHAGOGASTRODUODENOSCOPY N/A 11/06/2020   Procedure: ESOPHAGOGASTRODUODENOSCOPY (EGD);  Surgeon: Lucilla Lame, MD;  Location: [facility] ENDOSCOPY;  Service: Endoscopy;  Laterality: N/A;   SHOULDER ARTHROSCOPY WITH OPEN ROTATOR CUFF REPAIR Right 08/24/2017   Procedure: SHOULDER ARTHROSCOPY WITH OPEN ROTATOR CUFF REPAIR;  Surgeon: Corky Mull, MD;  Location: [facility] ORS;  Service: Orthopedics;  Laterality: Right;   SKIN CANCER EXCISION  12/01/2016   right ear    SKIN CANCER EXCISION     remove from the right side of the face    THROMBECTOMY Right 2004   leg   THYROIDECTOMY  1950   Not sure if total or partial thyroidectomy.    Allergies: Allergies  Allergen Reactions   Hydromorphone     Hallucination Pt reports he doesn't know about this    Home Medications: (Not in a hospital admission)  Home medication reconciliation was completed with the patient.   Scheduled Inpatient Medications:  heparin  2,000 Units Intravenous Once   ipratropium-albuterol  3 mL Nebulization Q4H   pantoprazole (PROTONIX) IV  40 mg Intravenous Q12H   potassium chloride  40 mEq Oral Q4H    Continuous Inpatient Infusions:    heparin      PRN Inpatient Medications:  acetaminophen, albuterol, dextromethorphan-guaiFENesin, hydrALAZINE, ondansetron (ZOFRAN) IV  Family History: family history includes CAD in his father; Heart attack in his father; Heart disease in his mother; Hypertension in his mother.  The patient's family history is negative for inflammatory bowel disorders, GI malignancy, or solid organ transplantation.  Social History:   reports that he quit smoking about  17 years ago. His smoking use included cigarettes. He started smoking about 63 years ago. He has a 46.00 pack-year smoking history. He quit smokeless tobacco use about 17 years ago.  His smokeless tobacco use included snuff. He reports that he does not currently use alcohol after a past usage of about 7.0 standard drinks per week. He reports that he does not use drugs. The patient denies ETOH, tobacco, or drug use.   Review of Systems: Constitutional: Weight is stable.  Eyes: No changes in vision. ENT: No oral lesions, sore throat.  GI: see HPI.  Heme/Lymph: No easy bruising.  CV: No chest pain GU: No hematuria.  Integumentary: No rashes.  Neuro: No headaches.  Psych: No depression/anxiety.  Endocrine: No heat/cold intolerance.  Allergic/Immunologic: No urticaria.  Resp: + cough + shortness of breath + increased respiratory effort  Musculoskeletal: No joint swelling.    Physical Examination: BP 113/71   Pulse 81   Temp 98.3 F (36.8 C) (Oral)   Resp (!) 28   Ht 6\' 2"  (1.88 m)   Wt 86.6 kg   SpO2 92%   BMI 24.52 kg/m  Gen: NAD, alert and oriented x 4 HEENT: PEERLA, EOMI, Neck: supple, no JVD or thyromegaly Chest: CTA bilaterally, no wheezes, crackles, or other adventitious sounds CV: RRR, no m/g/c/r Abd: soft, NT, ND, +BS in all four quadrants; no HSM, guarding, ridigity, or rebound tenderness Ext: no edema, well perfused with 2+ pulses, Skin: no rash or lesions noted Lymph: no LAD  Data: Lab Results  Component Value Date   WBC 9.3 11/11/2020   HGB 8.2 (L) 11/11/2020   HCT 26.3 (L) 11/11/2020   MCV 95.6 11/11/2020   PLT 119 (L) 11/11/2020   Recent Labs  Lab 11/05/20 0437 11/06/20 0523 11/11/20 1340  HGB 10.7* 9.7* 8.2*   Lab Results  Component Value Date   NA 139 11/11/2020   K 3.3 (L) 11/11/2020   CL 102 11/11/2020   CO2 28 11/11/2020   BUN 18 11/11/2020   CREATININE 0.91 11/11/2020   Lab Results  Component Value Date   ALT 27 11/11/2020   AST 23  11/11/2020   ALKPHOS 85 11/11/2020   BILITOT 1.3 (H) 11/11/2020   No results for input(s): APTT, INR, PTT in the last 168 hours. Assessment/Plan:  80 y/o Caucasian male with a PMH of HTN, HLD, atrial fibrillation on Eliquis, Hx of DVT, COPD on 2L O2, Hx of CVA, GERD, anxiety, CAD, diastolic CHF, carotid artery stenosis, and IDA admitted for progressive shortness of breath  CHF Exacerbation/Acute on chronic respiratory failure - Cardiology consult appreciated, continue care per primary team. Work-up for possible NSTEMI given elevated troponin 620. Repeat troponin pending  Anemia/Heme positive stool - light brown stool on rectal exam which was guaiac positive. Hemoglobin dropped from 9.7 five days ago to 8.2  today. H&H stable with no overt hematochezia or melena.   Electrolyte derangement - repleted  Atrial fibrillation - Eliquis on hold  Thrombocytopenia - stable  Recommendations:  - Continue to monitor serial H&H. Transfuse for Hgb <7.0.  - No evidence of overt gastrointestinal hemorrhage. H&H stable without precipitous drop in hemoglobin.  - Continue to monitor for signs and symptoms of bleeding - Continue management of COPD/CHF exacerbation  - No plans for emergent endoscopic evaluation given stability of H&H and no signs of gastrointestinal bleeding - Heme positive stool is not an indicator of GI bleeding and is solely intended for use as modality of colon cancer screening. I do not see previous colonoscopy report on file - Consider colonoscopy when clinically feasible - Okay to start aspirin 81 mg daily as recommended by cardiology  - Continue to hold Eliquis for now - Following along with you for now  Thank you for the consult. Please call with questions or concerns.  Reeves Forth [facility] [phone] (Cell)

## 2020-11-11 NOTE — ED Provider Notes (Addendum)
[facility] Emergency Department Provider Note  Time seen: 2:59 PM  I have reviewed the triage vital signs and the nursing notes.   HISTORY  Chief Complaint Shortness of Breath   HPI [name] Browning is a 80 y.o. male with a past medical history of anxiety, CAD, COPD on 2 L of oxygen, prior CVA, CHF, hypertension, hyperlipidemia, presents to the emergency department for shortness of breath.  According to the patient he was recently discharged from the hospital 10/28 for shortness of breath due to CHF exacerbation.  Patient is normally on 2 L at baseline but states worsening shortness of breath really since this morning.  Oxygen saturations 88% on 2 L increased to 4 L.  Patient states chest tightness but denies any chest pain.   Past Medical History:  Diagnosis Date   Anginal pain ([place])    Anxiety    Aortic atherosclerosis ([place])    Atrial fibrillation ([place]) 01/2019   Bilateral carpal tunnel syndrome    CAD (coronary artery disease)    a.) LHC 2004 --> normal coronaries. b.) normal stress test in 2007 and 2011; c.) Lexiscan 05/20/2014 --> LVEF 55-65%; no significant stress induced ischemia/arrythmia. d.) CT chest 03/25/2019 --> coronaries carcified.   Carotid atherosclerosis, bilateral    Carpal tunnel syndrome, bilateral    Chronic anticoagulation    a.) ASA + apixaban   COPD (chronic obstructive pulmonary disease) (HCC)    CVA (cerebral vascular accident) ([place])    Degenerative disc disease, cervical    Diastolic dysfunction    a.) TTE 05/29/2014 --> LVEF 60-65%; G1DD.   DVT (deep venous thrombosis) (HCC)    GERD (gastroesophageal reflux disease)    History of 2019 novel coronavirus disease (COVID-19) 02/08/2019   HLD (hyperlipidemia)    Hypertension    Kidney stones    Osteoarthritis of right shoulder    Pneumonia    Respiratory failure, acute ([place]) 09/20/2020   a.) severe respiratory distress 1 hour after urological surgery. CXR (+) for acute pulmonary  edema. Transferred to ICU and placed on NIPPV. Questionable aspiration PNA. (+) A.fib with RVR. Improved with ABX, diuresis, and amiodarone.   Skin cancer of face    a.) RIGHT ear and RIGHT forehead; excised.   Syncope    TIA (transient ischemic attack) 2016   Valvular regurgitation    a.) TTE 05/26/2014 --> LVEF 60-65%; trivial MR, mild TR; no AR or PR. b.) TTE 04/20/2015 --> LVEF 55-60%; trivial MR and PR; no AR or TR.    Patient Active Problem List   Diagnosis Date Noted   Dysphagia    Lactic acidosis    Atrial fibrillation ([place])    Septic shock ([place]) 10/21/2020   Acute on chronic respiratory failure with hypoxia ([place]) 10/20/2020   Community acquired pneumonia 10/20/2020   Hypotension 10/20/2020   DOE (dyspnea on exertion) 10/06/2020   Chronic respiratory failure with hypoxia ([place]) 10/06/2020   Malnutrition of moderate degree 09/21/2020   Urinary tract obstruction by kidney stone 09/19/2020   AKI (acute kidney injury) ([place]) 09/19/2020   COPD exacerbation ([place]) 09/18/2020   Acute respiratory failure with hypoxia ([place]) 09/18/2020   AF (paroxysmal atrial fibrillation) ([place]) 62/37/6283   Umbilical hernia without obstruction and without gangrene    Pneumonia 03/25/2019   History of 2019 novel coronavirus disease (COVID-19) 03/06/2019   COVID-19 virus infection 02/08/2019   New onset atrial fibrillation ([place]) 02/08/2019   COPD, moderate ([place]) 09/14/2018   Degenerative tear of glenoid labrum of  right shoulder 08/24/2017   Injury of tendon of long head of right biceps 08/21/2017   Rotator cuff tendinitis, right 08/21/2017   Traumatic complete tear of right rotator cuff 08/21/2017   Chronic neck pain 03/09/2017   Degenerative disc disease, cervical 03/09/2017   Perennial allergic rhinitis 03/09/2017   Hyperglycemia 09/01/2015   Elevated rheumatoid factor 04/08/2015   Arthritis of both hands 04/01/2015   Bilateral hand pain 03/18/2015   Elevated alkaline phosphatase level  03/02/2015   Anxiety 02/16/2015   Intermittent chest pain 02/16/2015   History of TIA (transient ischemic attack) 05/18/2014   HTN (hypertension) 05/18/2014   Hyperlipidemia 05/18/2014   Status post carpal tunnel release 06/26/2013    Past Surgical History:  Procedure Laterality Date   CARDIAC CATHETERIZATION  2004   CARPAL TUNNEL RELEASE Right 06/11/2013   CYSTOSCOPY W/ RETROGRADES  10/16/2020   Procedure: CYSTOSCOPY WITH RETROGRADE PYELOGRAM;  Surgeon: Billey Co, MD;  Location: [facility] ORS;  Service: Urology;;   CYSTOSCOPY W/ URETERAL STENT PLACEMENT Left 09/20/2020   Procedure: CYSTOSCOPY WITH RETROGRADE PYELOGRAM/URETERAL STENT PLACEMENT;  Surgeon: Janith Lima, MD;  Location: [facility] ORS;  Service: Urology;  Laterality: Left;   CYSTOSCOPY/URETEROSCOPY/HOLMIUM LASER/STENT PLACEMENT Left 10/16/2020   Procedure: CYSTOSCOPY/URETEROSCOPY/HOLMIUM LASER/STENT PLACEMENT;  Surgeon: Billey Co, MD;  Location: [facility] ORS;  Service: Urology;  Laterality: Left;   ESOPHAGOGASTRODUODENOSCOPY N/A 11/06/2020   Procedure: ESOPHAGOGASTRODUODENOSCOPY (EGD);  Surgeon: Lucilla Lame, MD;  Location: [facility] ENDOSCOPY;  Service: Endoscopy;  Laterality: N/A;   SHOULDER ARTHROSCOPY WITH OPEN ROTATOR CUFF REPAIR Right 08/24/2017   Procedure: SHOULDER ARTHROSCOPY WITH OPEN ROTATOR CUFF REPAIR;  Surgeon: Corky Mull, MD;  Location: [facility] ORS;  Service: Orthopedics;  Laterality: Right;   SKIN CANCER EXCISION  12/01/2016   right ear    SKIN CANCER EXCISION     remove from the right side of the face    THROMBECTOMY Right 2004   leg   THYROIDECTOMY  1950   Not sure if total or partial thyroidectomy.    Prior to Admission medications   Medication Sig Start Date End Date Taking? Authorizing Provider  acetaminophen (TYLENOL) 500 MG tablet Take 2 tablets (1,000 mg total) by mouth every 6 (six) hours as needed for mild pain. 05/07/20   Olean Ree, MD  apixaban (ELIQUIS) 5 MG TABS tablet Take 1 tablet (5 mg  total) by mouth 2 (two) times daily. 09/28/20 10/28/20  Pokhrel, Corrie Mckusick, MD  aspirin EC 81 MG tablet Take 81 mg by mouth daily after supper.    [provider]  atorvastatin (LIPITOR) 40 MG tablet Take 40 mg by mouth daily after supper.    [provider]  cetirizine (ZYRTEC) 10 MG tablet Take 10 mg by mouth daily. 09/29/20   [provider]  famotidine (PEPCID) 20 MG tablet One after supper 10/06/20   Tanda Rockers, MD  Ferrous Gluconate (IRON) 240 (27 Fe) MG TABS Take 27 mg by mouth daily after supper. 09/21/18   [provider]  guaiFENesin-dextromethorphan (ROBITUSSIN DM) 100-10 MG/5ML syrup Take 5 mLs by mouth every 4 (four) hours as needed for cough. 09/28/20   Pokhrel, Corrie Mckusick, MD  montelukast (SINGULAIR) 10 MG tablet Take 10 mg by mouth at bedtime.    [provider]  pantoprazole (PROTONIX) 40 MG tablet Take 40 mg by mouth daily after supper. 12/10/18   [provider]  phenazopyridine (PYRIDIUM) 200 MG tablet Take 1 tablet (200 mg total) by mouth 3 (three) times daily as needed (  bladder pain). 09/28/20   Pokhrel, Corrie Mckusick, MD  polyethylene glycol (MIRALAX / GLYCOLAX) 17 g packet Take 17 g by mouth daily as needed for mild constipation or moderate constipation. Patient taking differently: Take 17 g by mouth daily. 09/28/20   Pokhrel, Corrie Mckusick, MD  SYMBICORT 160-4.5 MCG/ACT inhaler Inhale 2 puffs into the lungs 2 (two) times daily as needed (shortness of breath). 12/07/18   [provider]  tamsulosin (FLOMAX) 0.4 MG CAPS capsule TAKE 1 CAPSULE BY MOUTH EVERY DAY 11/09/20   Billey Co, MD    Allergies  Allergen Reactions   Hydromorphone     Hallucination Pt reports he doesn't know about this    Family History  Problem Relation Age of Onset   Hypertension Mother    Heart disease Mother    CAD Father    Heart attack Father     Social History Social History   Tobacco Use   Smoking status: Former    Packs/day: 1.00     Years: 46.00    Pack years: 46.00    Types: Cigarettes    Start date: 01/10/1957    Quit date: 02/11/2003    Years since quitting: 17.7   Smokeless tobacco: Former    Types: Snuff    Quit date: 02/11/2003  Vaping Use   Vaping Use: Never used  Substance Use Topics   Alcohol use: Not Currently    Alcohol/week: 7.0 standard drinks    Types: 7 Cans of beer per week   Drug use: No    Review of Systems Constitutional: Negative for fever. Cardiovascular: Negative for chest pain.  Positive for chest tightness Respiratory: Worsening shortness of breath since this morning. Gastrointestinal: Negative for abdominal pain, vomiting  Musculoskeletal: Mild lower extremity edema Neurological: Negative for headache All other ROS negative  ____________________________________________   PHYSICAL EXAM:  VITAL SIGNS: ED Triage Vitals  Enc Vitals Group     BP 11/11/20 1319 120/65     Pulse Rate 11/11/20 1319 89     Resp 11/11/20 1319 (!) 24     Temp 11/11/20 1319 98.3 F (36.8 C)     Temp Source 11/11/20 1319 Oral     SpO2 11/11/20 1319 99 %     Weight 11/11/20 1321 191 lb (86.6 kg)     Height 11/11/20 1321 6\' 2"  (1.88 m)     Head Circumference --      Peak Flow --      Pain Score 11/11/20 1321 0     Pain Loc --      Pain Edu? --      Excl. in [place]? --     Constitutional: Alert and oriented.  No acute distress. Eyes: Normal exam ENT      Head: Normocephalic and atraumatic.      Mouth/Throat: Mucous membranes are moist. Cardiovascular: Normal rate, regular rhythm. Respiratory: Increase respiratory rate around 30 breaths/min with mild increase in respiratory effort mild expiratory wheeze bilaterally. Gastrointestinal: Soft and nontender. No distention.   Musculoskeletal: Nontender with normal range of motion in all extremities.  Mild lower extremity edema bilaterally Neurologic:  Normal speech and language. No gross focal neurologic deficits  Skin:  Skin is warm, dry and intact.   Psychiatric: Mood and affect are normal. Speech and behavior are normal.   ____________________________________________    EKG  EKG viewed and interpreted by myself shows atrial fibrillation 107 bpm with a narrow QRS, normal axis, normal intervals, nonspecific but no concerning ST changes.  ____________________________________________    RADIOLOGY  Chest x-ray shows interstitial edema  ____________________________________________   INITIAL IMPRESSION / ASSESSMENT AND PLAN / ED COURSE  Pertinent labs & imaging results that were available during my care of the patient were reviewed by me and considered in my medical decision making (see chart for details).   Patient presents emergency department for worsening shortness of breath and chest tightness.  Patient has expiratory wheeze history of COPD given duo nebs and Solu-Medrol upon arrival.  Patient does have mild lower extremity edema was discharged from the hospital approximately 5 days ago for CHF exacerbation.  Patient's labs have resulted showing elevated troponin of 620, which appears to be significantly over his baseline.  We will cover with IV heparin for possible NSTEMI leading to CHF exacerbation.  Rectal examination shows light brown stool but it is guaiac positive.  Given the recent hemoglobin drop guaiac positive stool we will hold off on heparin for now.  The hospitalist is aware.  [name] Browning was evaluated in Emergency Department on 11/11/2020 for the symptoms described in the history of present illness. He was evaluated in the context of the global COVID-19 pandemic, which necessitated consideration that the patient might be at risk for infection with the SARS-CoV-2 virus that causes COVID-19. Institutional protocols and algorithms that pertain to the evaluation of patients at risk for COVID-19 are in a state of rapid change based on information released by regulatory bodies including the CDC and federal and state  organizations. These policies and algorithms were followed during the patient's care in the ED.  CRITICAL CARE Performed by: Harvest Dark   Total critical care time: 30 minutes  Critical care time was exclusive of separately billable procedures and treating other patients.  Critical care was necessary to treat or prevent imminent or life-threatening deterioration.  Critical care was time spent personally by me on the following activities: development of treatment plan with patient and/or surrogate as well as nursing, discussions with consultants, evaluation of patient's response to treatment, examination of patient, obtaining history from patient or surrogate, ordering and performing treatments and interventions, ordering and review of laboratory studies, ordering and review of radiographic studies, pulse oximetry and re-evaluation of patient's condition.  ____________________________________________   FINAL CLINICAL IMPRESSION(S) / ED DIAGNOSES  CHF exacerbation NSTEMI   Harvest Dark, MD 11/11/20 1511    Harvest Dark, MD 11/11/20 1535

## 2020-11-12 DIAGNOSIS — I5033 Acute on chronic diastolic (congestive) heart failure: Secondary | ICD-10-CM | POA: Diagnosis not present

## 2020-11-12 LAB — CBC
HCT: 25.9 % — ABNORMAL LOW (ref 39.0–52.0)
HCT: 25.7 % — ABNORMAL LOW (ref 39.0–52.0)
HCT: 26.2 % — ABNORMAL LOW (ref 39.0–52.0)
HCT: 25.5 % — ABNORMAL LOW (ref 39.0–52.0)
Hemoglobin: 8 g/dL — ABNORMAL LOW (ref 13.0–17.0)
Hemoglobin: 8.3 g/dL — ABNORMAL LOW (ref 13.0–17.0)
Hemoglobin: 8.3 g/dL — ABNORMAL LOW (ref 13.0–17.0)
Hemoglobin: 8 g/dL — ABNORMAL LOW (ref 13.0–17.0)
MCH: 29.2 pg (ref 26.0–34.0)
MCH: 30.9 pg (ref 26.0–34.0)
MCH: 30.4 pg (ref 26.0–34.0)
MCH: 29.5 pg (ref 26.0–34.0)
MCHC: 30.9 g/dL (ref 30.0–36.0)
MCHC: 32.3 g/dL (ref 30.0–36.0)
MCHC: 31.7 g/dL (ref 30.0–36.0)
MCHC: 31.4 g/dL (ref 30.0–36.0)
MCV: 94.5 fL (ref 80.0–100.0)
MCV: 95.5 fL (ref 80.0–100.0)
MCV: 96 fL (ref 80.0–100.0)
MCV: 94.1 fL (ref 80.0–100.0)
Platelets: 123 10*3/uL — ABNORMAL LOW (ref 150–400)
Platelets: 120 10*3/uL — ABNORMAL LOW (ref 150–400)
Platelets: 131 10*3/uL — ABNORMAL LOW (ref 150–400)
Platelets: 145 10*3/uL — ABNORMAL LOW (ref 150–400)
RBC: 2.74 MIL/uL — ABNORMAL LOW (ref 4.22–5.81)
RBC: 2.69 MIL/uL — ABNORMAL LOW (ref 4.22–5.81)
RBC: 2.73 MIL/uL — ABNORMAL LOW (ref 4.22–5.81)
RBC: 2.71 MIL/uL — ABNORMAL LOW (ref 4.22–5.81)
RDW: 15.1 % (ref 11.5–15.5)
RDW: 15.1 % (ref 11.5–15.5)
RDW: 15.2 % (ref 11.5–15.5)
RDW: 15.7 % — ABNORMAL HIGH (ref 11.5–15.5)
WBC: 5.3 10*3/uL (ref 4.0–10.5)
WBC: 6.7 10*3/uL (ref 4.0–10.5)
WBC: 9.4 10*3/uL (ref 4.0–10.5)
WBC: 11.1 10*3/uL — ABNORMAL HIGH (ref 4.0–10.5)
nRBC: 0 % (ref 0.0–0.2)
nRBC: 0 % (ref 0.0–0.2)
nRBC: 0 % (ref 0.0–0.2)
nRBC: 0 % (ref 0.0–0.2)

## 2020-11-12 LAB — BASIC METABOLIC PANEL
Anion gap: 9 (ref 5–15)
BUN: 19 mg/dL (ref 8–23)
CO2: 26 mmol/L (ref 22–32)
Calcium: 8.4 mg/dL — ABNORMAL LOW (ref 8.9–10.3)
Chloride: 103 mmol/L (ref 98–111)
Creatinine, Ser: 0.95 mg/dL (ref 0.61–1.24)
GFR, Estimated: >60 mL/min (ref >60)
Glucose, Bld: 213 mg/dL — ABNORMAL HIGH (ref 70–99)
Potassium: 4 mmol/L (ref 3.5–5.1)
Sodium: 138 mmol/L (ref 135–145)

## 2020-11-12 LAB — LIPID PANEL
Cholesterol: 141 mg/dL (ref 0–200)
HDL: 38 mg/dL — ABNORMAL LOW (ref >40)
LDL Cholesterol: 94 mg/dL (ref 0–99)
Total CHOL/HDL Ratio: 3.7 RATIO
Triglycerides: 47 mg/dL (ref <150)
VLDL: 9 mg/dL (ref 0–40)

## 2020-11-12 LAB — MAGNESIUM: Magnesium: 1.8 mg/dL (ref 1.7–2.4)

## 2020-11-12 MED ORDER — DILTIAZEM HCL 25 MG/5ML IV SOLN
INTRAVENOUS | Status: AC
  Administered 2020-11-12: 10 mg via INTRAVENOUS
  Filled 2020-11-12: qty 5

## 2020-11-12 MED ORDER — DILTIAZEM HCL 25 MG/5ML IV SOLN: 10.0000 mg | Freq: Once | INTRAVENOUS | Status: AC

## 2020-11-12 MED ORDER — IPRATROPIUM-ALBUTEROL 0.5-2.5 (3) MG/3ML IN SOLN
3.0000 mL | Freq: Four times a day (QID) | RESPIRATORY_TRACT | Status: DC
  Administered 2020-11-12 – 2020-11-19 (×27): 3 mL via RESPIRATORY_TRACT
  Filled 2020-11-12 (×27): qty 3

## 2020-11-12 MED ORDER — METHOCARBAMOL 500 MG PO TABS
500.0000 mg | ORAL_TABLET | Freq: Three times a day (TID) | ORAL | Status: DC
  Administered 2020-11-12 – 2020-11-19 (×19): 500 mg via ORAL
  Filled 2020-11-12 (×25): qty 1

## 2020-11-12 MED ORDER — METOPROLOL TARTRATE 5 MG/5ML IV SOLN
5.0000 mg | Freq: Once | INTRAVENOUS | Status: AC
Start: 2020-11-12 — End: 2020-11-12
  Administered 2020-11-12: 5 mg via INTRAVENOUS
  Filled 2020-11-12: qty 5

## 2020-11-12 MED ORDER — METOPROLOL TARTRATE 25 MG PO TABS
12.5000 mg | ORAL_TABLET | Freq: Two times a day (BID) | ORAL | Status: DC
  Administered 2020-11-12 – 2020-11-17 (×7): 12.5 mg via ORAL
  Filled 2020-11-12 (×7): qty 1

## 2020-11-12 MED ORDER — SODIUM CHLORIDE 0.9 % IV BOLUS
250.0000 mL | Freq: Once | INTRAVENOUS | Status: AC
  Administered 2020-11-12: 250 mL via INTRAVENOUS

## 2020-11-12 NOTE — Progress Notes (Signed)
GI PROGRESS NOTE    [name] Browning  FTD:[id number] DOB: Sep 19, 1940 DOA: 11/11/2020 PCP: [facility]  Brief Narrative:  This 80 years old male with PMH significant for diastolic CHF, hyperlipidemia, hypertension, DVT and A. fib on Eliquis, COPD on 2 L of supplemental oxygen at baseline, stroke, GERD, anxiety, CAD, carotid artery stenosis, history of syncope,  iron deficiency anemia presented to the ED with complaints of worsening shortness of breath. Patient was recently hospitalized from 10/25-10/28 for dysphagia.  Patient was seen by speech therapy with no signs of aspiration.  Gastroenterology was consulted and Patient underwent EGD with no significant findings.  Patient is found to have elevated troponins, drop in hemoglobin from 9.7-8.2 with stool positive occult blood. Patient is admitted for acute on chronic hypoxic respiratory failure secondary to acute on chronic diastolic CHF and found to have occult positive stool.  Assessment & Plan:   Principal Problem:   Acute on chronic diastolic CHF (congestive heart failure) (HCC) Active Problems:   HTN (hypertension)   COPD, moderate (HCC)   Acute on chronic respiratory failure with hypoxia (HCC)   Atrial fibrillation (HCC)   DVT (deep venous thrombosis) (HCC)   HLD (hyperlipidemia)   Stroke (HCC)   CAD (coronary artery disease)   NSTEMI (non-ST elevated myocardial infarction) (HCC)   Hypokalemia   Thrombocytopenia (HCC)   Iron deficiency anemia   GI bleeding  Acute on chronic hypoxic respiratory failure sec. to acute on chronic diastolic CHF: Patient presented with shortness of breath, BNP 220, JVD+ Orthopnea, pedal edema consistent with CHF exacerbation. Continue Lasix 20 mg twice daily, Monitor Daily weight, intake output charting, low-salt diet.  CAD / NSTEMI: Presented with elevated troponin, patient denies any chest pain. This could be demand ischemia in the setting of acute CHF. Will hold IV heparin given drop in  hemoglobin and occult positive stools. Trop  trending down. 665 > 632 > 493> Continue Lipitor and as needed nitroglycerin. Cardiology consulted, thinks  demand ischemia in the setting of anemia and volume overload from stopping his metoprolol and Lasix at discharge. Hold Eliquis, no indication for heparin. Continue aspirin,  resume metoprolol and Lasix 20 twice daily.  Essential hypertension Continue Lasix, IV hydralazine. Resume metoprolol.  COPD: Noted on exam.  Continue bronchodilators.  Atrial fibrillation: Hold Eliquis due to GI bleed.   Heart rate fluctuating, resume metoprolol 12.5 mg twice daily  Hx. Of DVT: Patient does have history of right leg DVT. Eliquis is on hold for positive GI bleed  Hyperlipidemia: Continue Lipitor  History of stroke: Resume aspirin , Hold Eliquis.  Thrombocytopenia: Chronic, platelet is stable  Iron deficiency anemia: Patient presented with dark-colored stools and stool for occult positive Hb  dropped from 9.7->8.2. GI consulted, no plan for any invasive intervention until actively bleeding. Continue Protonix 40 IV every 12 hours Transfuse if hemoglobin drops below 7. Moniter H&H.  DVT prophylaxis: SCDs Code Status: Full code. Family Communication: Wife at bed side. Disposition Plan:     Status is: Inpatient  Remains inpatient appropriate because: Acute on chronic hypoxic respiratory failure, acute on chronic diastolic CHF, acute blood loss anemia.  Consultants:  Cardiology Gastroenterology  Procedures: None Antimicrobials: None  Subjective: Patient was seen and examined at bedside.  Overnight events noted.   Patient is sitting comfortably on the bed,  states he is feeling better, denies any  N/V/D  Objective: Vitals:   11/12/20 0930 11/12/20 1230 11/12/20 1348 11/12/20 1508  BP: 113/69 111/69  120/66  Pulse: 80 88 (!) 133 94  Resp: (!) 24 18  15   Temp:      TempSrc:      SpO2: 100% 97%  99%  Weight:       Height:       No intake or output data in the 24 hours ending 11/12/20 1522 Filed Weights   11/11/20 1321  Weight: 86.6 kg    Examination:  General exam: Appears comfortable, not in any acute distress.  Deconditioned Respiratory system: Clear to auscultation bilaterally. Respiratory effort normal. Cardiovascular system: S1-S2 heard, regular rate and rhythm, no murmur. Gastrointestinal system: Abdomen is soft, nontender, nondistended, BS +. Central nervous system: Alert and oriented x 3. No focal neurological deficits. Extremities: pedal edema + , no cyanosis, no clubbing. Skin: No rashes, lesions or ulcers Psychiatry: Judgement and insight appear normal. Mood & affect appropriate.     Data Reviewed: I have personally reviewed following labs and imaging studies  CBC: Recent Labs  Lab 11/11/20 1340 11/11/20 1538 11/12/20 0435 11/12/20 0924 11/12/20 1443  WBC 9.3 9.2 5.3 6.7 9.4  HGB 8.2* 8.5* 8.0* 8.3* 8.3*  HCT 26.3* 26.8* 25.9* 25.7* 26.2*  MCV 95.6 95.4 94.5 95.5 96.0  PLT 119* 116* 123* 120* 322*   Basic Metabolic Panel: Recent Labs  Lab 11/06/20 0523 11/11/20 1340 11/11/20 1527 11/12/20 0435  NA 140 139  --  138  K 3.4* 3.3*  --  4.0  CL 111 102  --  103  CO2 26 28  --  26  GLUCOSE 106* 107*  --  213*  BUN 21 18  --  19  CREATININE 0.81 0.91  --  0.95  CALCIUM 7.5* 8.5*  --  8.4*  MG 1.7  --  2.0 1.8   GFR: Estimated Creatinine Clearance: 73.3 mL/min (by C-G formula based on SCr of 0.95 mg/dL). Liver Function Tests: Recent Labs  Lab 11/11/20 1340  AST 23  ALT 27  ALKPHOS 85  BILITOT 1.3*  PROT 5.7*  ALBUMIN 2.9*   No results for input(s): LIPASE, AMYLASE in the last 168 hours. No results for input(s): AMMONIA in the last 168 hours. Coagulation Profile: Recent Labs  Lab 11/11/20 1516  INR 1.5*   Cardiac Enzymes: No results for input(s): CKTOTAL, CKMB, CKMBINDEX, TROPONINI in the last 168 hours. BNP (last 3 results) No results for  input(s): PROBNP in the last 8760 hours. HbA1C: Recent Labs    11/11/20 1538  HGBA1C 6.0*   CBG: No results for input(s): GLUCAP in the last 168 hours. Lipid Profile: Recent Labs    11/12/20 0435  CHOL 141  HDL 38*  LDLCALC 94  TRIG 47  CHOLHDL 3.7   Thyroid Function Tests: No results for input(s): TSH, T4TOTAL, FREET4, T3FREE, THYROIDAB in the last 72 hours. Anemia Panel: No results for input(s): VITAMINB12, FOLATE, FERRITIN, TIBC, IRON, RETICCTPCT in the last 72 hours. Sepsis Labs: No results for input(s): PROCALCITON, LATICACIDVEN in the last 168 hours.  Recent Results (from the past 240 hour(s))  Blood culture (single)     Status: None   Collection Time: 11/03/20  1:26 PM   Specimen: BLOOD  Result Value Ref Range Status   Specimen Description BLOOD RIGHT [facility]  Final   Special Requests   Final    BOTTLES DRAWN AEROBIC AND ANAEROBIC Blood Culture adequate volume   Culture   Final    NO GROWTH 5 DAYS Performed at [facility], [address], [place], [place] [address]  Report Status 11/08/2020 FINAL  Final  Resp Panel by RT-PCR (Flu A&B, Covid) Nasopharyngeal Swab     Status: None   Collection Time: 11/03/20  1:26 PM   Specimen: Nasopharyngeal Swab; Nasopharyngeal(NP) swabs in vial transport medium  Result Value Ref Range Status   SARS Coronavirus 2 by RT PCR NEGATIVE NEGATIVE Final    Comment: (NOTE) SARS-CoV-2 target nucleic acids are NOT DETECTED.  The SARS-CoV-2 RNA is generally detectable in upper respiratory specimens during the acute phase of infection. The lowest concentration of SARS-CoV-2 viral copies this assay can detect is 138 copies/mL. A negative result does not preclude SARS-Cov-2 infection and should not be used as the sole basis for treatment or other patient management decisions. A negative result may occur with  improper specimen collection/handling, submission of specimen other than nasopharyngeal swab, presence of viral  mutation(s) within the areas targeted by this assay, and inadequate number of viral copies(<138 copies/mL). A negative result must be combined with clinical observations, patient history, and epidemiological information. The expected result is Negative.  Fact Sheet for Patients:  EntrepreneurPulse.com.au  Fact Sheet for Healthcare Providers:  IncredibleEmployment.be  This test is no t yet approved or cleared by the [place] FDA and  has been authorized for detection and/or diagnosis of SARS-CoV-2 by FDA under an Emergency Use Authorization (EUA). This EUA will remain  in effect (meaning this test can be used) for the duration of the COVID-19 declaration under Section 564(b)(1) of the Act, 21 U.S.C.section 360bbb-3(b)(1), unless the authorization is terminated  or revoked sooner.       Influenza A by PCR NEGATIVE NEGATIVE Final   Influenza B by PCR NEGATIVE NEGATIVE Final    Comment: (NOTE) The Xpert Xpress SARS-CoV-2/FLU/RSV plus assay is intended as an aid in the diagnosis of influenza from Nasopharyngeal swab specimens and should not be used as a sole basis for treatment. Nasal washings and aspirates are unacceptable for Xpert Xpress SARS-CoV-2/FLU/RSV testing.  Fact Sheet for Patients: EntrepreneurPulse.com.au  Fact Sheet for Healthcare Providers: IncredibleEmployment.be  This test is not yet approved or cleared by the [place] FDA and has been authorized for detection and/or diagnosis of SARS-CoV-2 by FDA under an Emergency Use Authorization (EUA). This EUA will remain in effect (meaning this test can be used) for the duration of the COVID-19 declaration under Section 564(b)(1) of the Act, 21 U.S.C. section 360bbb-3(b)(1), unless the authorization is terminated or revoked.  Performed at [facility], [address] [place], [place] [address]   Blood culture (single)      Status: None   Collection Time: 11/03/20  2:30 PM   Specimen: BLOOD  Result Value Ref Range Status   Specimen Description BLOOD LEFT ANTECUBITAL  Final   Special Requests   Final    BOTTLES DRAWN AEROBIC AND ANAEROBIC Blood Culture results may not be optimal due to an excessive volume of blood received in culture bottles   Culture   Final    NO GROWTH 5 DAYS Performed at [facility], [address], [place], [place] [address]    Report Status 11/08/2020 FINAL  Final  Urine Culture     Status: None   Collection Time: 11/03/20  8:26 PM   Specimen: Urine, Clean Catch  Result Value Ref Range Status   Specimen Description   Final    URINE, CLEAN CATCH Performed at [facility], [address] [place] [address], [place], [place] [address]    Special Requests   Final  NONE Performed at [facility], [address] [place], [place] [address]    Culture   Final    NO GROWTH Performed at [facility], [place] [address] [place], [place] [address]    Report Status 11/04/2020 FINAL  Final  Resp Panel by RT-PCR (Flu A&B, Covid) Nasopharyngeal Swab     Status: None   Collection Time: 11/11/20  1:40 PM   Specimen: Nasopharyngeal Swab; Nasopharyngeal(NP) swabs in vial transport medium  Result Value Ref Range Status   SARS Coronavirus 2 by RT PCR NEGATIVE NEGATIVE Final    Comment: (NOTE) SARS-CoV-2 target nucleic acids are NOT DETECTED.  The SARS-CoV-2 RNA is generally detectable in upper respiratory specimens during the acute phase of infection. The lowest concentration of SARS-CoV-2 viral copies this assay can detect is 138 copies/mL. A negative result does not preclude SARS-Cov-2 infection and should not be used as the sole basis for treatment or other patient management decisions. A negative result may occur with  improper specimen collection/handling, submission of specimen other than nasopharyngeal swab, presence of viral mutation(s) within the areas targeted by  this assay, and inadequate number of viral copies(<138 copies/mL). A negative result must be combined with clinical observations, patient history, and epidemiological information. The expected result is Negative.  Fact Sheet for Patients:  EntrepreneurPulse.com.au  Fact Sheet for Healthcare Providers:  IncredibleEmployment.be  This test is no t yet approved or cleared by the [place] FDA and  has been authorized for detection and/or diagnosis of SARS-CoV-2 by FDA under an Emergency Use Authorization (EUA). This EUA will remain  in effect (meaning this test can be used) for the duration of the COVID-19 declaration under Section 564(b)(1) of the Act, 21 U.S.C.section 360bbb-3(b)(1), unless the authorization is terminated  or revoked sooner.       Influenza A by PCR NEGATIVE NEGATIVE Final   Influenza B by PCR NEGATIVE NEGATIVE Final    Comment: (NOTE) The Xpert Xpress SARS-CoV-2/FLU/RSV plus assay is intended as an aid in the diagnosis of influenza from Nasopharyngeal swab specimens and should not be used as a sole basis for treatment. Nasal washings and aspirates are unacceptable for Xpert Xpress SARS-CoV-2/FLU/RSV testing.  Fact Sheet for Patients: EntrepreneurPulse.com.au  Fact Sheet for Healthcare Providers: IncredibleEmployment.be  This test is not yet approved or cleared by the [place] FDA and has been authorized for detection and/or diagnosis of SARS-CoV-2 by FDA under an Emergency Use Authorization (EUA). This EUA will remain in effect (meaning this test can be used) for the duration of the COVID-19 declaration under Section 564(b)(1) of the Act, 21 U.S.C. section 360bbb-3(b)(1), unless the authorization is terminated or revoked.  Performed at [facility], [address], [place],  [address]     Radiology Studies: US Venous Img Lower Bilateral (DVT)  Result Date:  11/11/2020 CLINICAL DATA:  Shortness of breath EXAM: Bilateral LOWER EXTREMITY VENOUS DOPPLER ULTRASOUND TECHNIQUE: Gray-scale sonography with compression, as well as color and duplex ultrasound, were performed to evaluate the deep venous system(s) from the level of the common femoral vein through the popliteal and proximal calf veins. COMPARISON:  None. FINDINGS: VENOUS Normal compressibility of the common femoral, superficial femoral, and popliteal veins, as well as the visualized calf veins. Visualized portions of profunda femoral vein and great saphenous vein unremarkable. No filling defects to suggest DVT on grayscale or color Doppler imaging. Doppler waveforms show normal direction of venous flow, normal respiratory plasticity and response to augmentation. Limited views of  the contralateral common femoral vein are unremarkable. OTHER None. Limitations: none IMPRESSION: Negative. Electronically Signed   By: Merilyn Baba M.D.   On: 11/11/2020 18:07   DG Chest Portable 1 View  Result Date: 11/11/2020 CLINICAL DATA:  Shortness of breath EXAM: PORTABLE CHEST 1 VIEW COMPARISON:  Previous studies including the examination of 10/29/2020 FINDINGS: Transverse diameter of heart is slightly increased. Thoracic aorta is tortuous and ectatic. There is prominence of interstitial markings in the parahilar regions with interval worsening. There is faint haziness in the lateral aspect of left mid and both lower lung fields. There is a small smooth marginated radiopacity, possibly in the lateral aspect of minor fissure in the right mid lung fields. There is pleural thickening in both apices, more so on the left side. There is no pneumothorax. Lateral CP angles are clear. IMPRESSION: There is increase in interstitial markings in the parahilar regions suggesting interstitial edema or interstitial pneumonitis. Part of this finding is probably underlying scarring. There is diffuse haziness in the lateral aspect of left mid and  both lower lung fields which may be due to pleural thickening or loculated pleural effusions or pneumonia. Follow-up chest radiographs and CT as clinically warranted should be considered. Electronically Signed   By: Elmer Picker M.D.   On: 11/11/2020 13:38    Scheduled Meds:  atorvastatin  40 mg Oral QPC supper   ferrous gluconate  324 mg Oral QPC supper   furosemide  20 mg Intravenous Q12H   ipratropium-albuterol  3 mL Nebulization Q4H   metoprolol tartrate  12.5 mg Oral BID   mometasone-formoterol  2 puff Inhalation BID   montelukast  10 mg Oral QHS   pantoprazole (PROTONIX) IV  40 mg Intravenous Q12H   tamsulosin  0.4 mg Oral Daily   Continuous Infusions:   LOS: 1 day    Time spent: 35 mins    Rebeccah Ivins, MD [facility]   If 7PM-7AM, please contact night-coverage

## 2020-11-12 NOTE — ED Notes (Signed)
Pt transferred to hospital bed, tolerated procedure. In NAD

## 2020-11-12 NOTE — ED Notes (Signed)
Pt sleeping at this time. Respirations even and unlabored, NAD noted

## 2020-11-12 NOTE — ED Notes (Signed)
Pt called reports sudden lt arm pain radiating to chest , Pain score 6/10. EKG done showed to ERMD.  Attending physician notified . Also made aware pt noted Afib RVR  HR=120-130's for past hr

## 2020-11-12 NOTE — ED Notes (Signed)
Patient eating lunch tray at this time °

## 2020-11-12 NOTE — Progress Notes (Signed)
[facility] Gastroenterology Inpatient Progress Note  Subjective: Patient seen for f/u anemia, heme positive stool. Patient denies overt bleeding, abdominal pain. Hgb stable around 8.3 . Patient with minimal SOB, but with some chest pain a few minutes ago requiring SL NTG.   Objective: Vital signs in last 24 hours: Temp:  [97.5 F (36.4 C)] 97.5 F (36.4 C) (11/03 0550) Pulse Rate:  [54-171] 133 (11/03 1348) Resp:  [13-28] 18 (11/03 1230) BP: (82-125)/(59-86) 111/69 (11/03 1230) SpO2:  [90 %-100 %] 97 % (11/03 1230) Blood pressure 111/69, pulse (!) 133, temperature (!) 97.5 F (36.4 C), temperature source Oral, resp. rate 18, height 6\' 2"  (1.88 m), weight 86.6 kg, SpO2 97 %.    Intake/Output from previous day: No intake/output data recorded.  Intake/Output this shift: No intake/output data recorded.   Gen: NAD. Appears comfortable.  HEENT: [place]/AT. PERRLA. Normal external ear exam.  Chest: CTA, no wheezes.  CV: RR nl S1, S2. No gallops.  Abd: soft, nt, nd. BS+  Ext: no edema. Pulses 2+  Neuro: Alert and oriented. Judgement appears normal. Nonfocal.   Lab Results: Results for orders placed or performed during the hospital encounter of 11/11/20 (from the past 24 hour(s))  Protime-INR     Status: Abnormal   Collection Time: 11/11/20  3:16 PM  Result Value Ref Range   Prothrombin Time 18.1 (H) 11.4 - 15.2 seconds   INR 1.5 (H) 0.8 - 1.2  APTT     Status: Abnormal   Collection Time: 11/11/20  3:16 PM  Result Value Ref Range   aPTT 44 (H) 24 - 36 seconds  Magnesium     Status: None   Collection Time: 11/11/20  3:27 PM  Result Value Ref Range   Magnesium 2.0 1.7 - 2.4 mg/dL  Troponin I (High Sensitivity)     Status: Abnormal   Collection Time: 11/11/20  3:27 PM  Result Value Ref Range   Troponin I (High Sensitivity) 665 (HH) <18 ng/L  Hemoglobin A1c     Status: Abnormal   Collection Time: 11/11/20  3:38 PM  Result Value Ref Range   Hgb A1c MFr Bld 6.0 (H) 4.8 -  5.6 %   Mean Plasma Glucose 125.5 mg/dL  CBC     Status: Abnormal   Collection Time: 11/11/20  3:38 PM  Result Value Ref Range   WBC 9.2 4.0 - 10.5 K/uL   RBC 2.81 (L) 4.22 - 5.81 MIL/uL   Hemoglobin 8.5 (L) 13.0 - 17.0 g/dL   HCT 26.8 (L) 39.0 - 52.0 %   MCV 95.4 80.0 - 100.0 fL   MCH 30.2 26.0 - 34.0 pg   MCHC 31.7 30.0 - 36.0 g/dL   RDW 15.3 11.5 - 15.5 %   Platelets 116 (L) 150 - 400 K/uL   nRBC 0.0 0.0 - 0.2 %  Type and screen [place]     Status: None   Collection Time: 11/11/20  3:40 PM  Result Value Ref Range   ABO/RH(D) O POS    Antibody Screen NEG    Sample Expiration      11/14/2020,2359 Performed at [facility], [address] [place], [place] [address]   Troponin I (High Sensitivity)     Status: Abnormal   Collection Time: 11/11/20  7:00 PM  Result Value Ref Range   Troponin I (High Sensitivity) 632 (HH) <18 ng/L  Troponin I (High Sensitivity)     Status: Abnormal   Collection Time: 11/11/20 10:11  PM  Result Value Ref Range   Troponin I (High Sensitivity) 493 (HH) <18 ng/L  CBC     Status: Abnormal   Collection Time: 11/12/20  4:35 AM  Result Value Ref Range   WBC 5.3 4.0 - 10.5 K/uL   RBC 2.74 (L) 4.22 - 5.81 MIL/uL   Hemoglobin 8.0 (L) 13.0 - 17.0 g/dL   HCT 25.9 (L) 39.0 - 52.0 %   MCV 94.5 80.0 - 100.0 fL   MCH 29.2 26.0 - 34.0 pg   MCHC 30.9 30.0 - 36.0 g/dL   RDW 15.1 11.5 - 15.5 %   Platelets 123 (L) 150 - 400 K/uL   nRBC 0.0 0.0 - 0.2 %  Lipid panel     Status: Abnormal   Collection Time: 11/12/20  4:35 AM  Result Value Ref Range   Cholesterol 141 0 - 200 mg/dL   Triglycerides 47 <150 mg/dL   HDL 38 (L) >40 mg/dL   Total CHOL/HDL Ratio 3.7 RATIO   VLDL 9 0 - 40 mg/dL   LDL Cholesterol 94 0 - 99 mg/dL  Basic metabolic panel     Status: Abnormal   Collection Time: 11/12/20  4:35 AM  Result Value Ref Range   Sodium 138 135 - 145 mmol/L   Potassium 4.0 3.5 - 5.1 mmol/L   Chloride 103 98 - 111 mmol/L   CO2  26 22 - 32 mmol/L   Glucose, Bld 213 (H) 70 - 99 mg/dL   BUN 19 8 - 23 mg/dL   Creatinine, Ser 0.95 0.61 - 1.24 mg/dL   Calcium 8.4 (L) 8.9 - 10.3 mg/dL   GFR, Estimated >60 >60 mL/min   Anion gap 9 5 - 15  Magnesium     Status: None   Collection Time: 11/12/20  4:35 AM  Result Value Ref Range   Magnesium 1.8 1.7 - 2.4 mg/dL  CBC     Status: Abnormal   Collection Time: 11/12/20  9:24 AM  Result Value Ref Range   WBC 6.7 4.0 - 10.5 K/uL   RBC 2.69 (L) 4.22 - 5.81 MIL/uL   Hemoglobin 8.3 (L) 13.0 - 17.0 g/dL   HCT 25.7 (L) 39.0 - 52.0 %   MCV 95.5 80.0 - 100.0 fL   MCH 30.9 26.0 - 34.0 pg   MCHC 32.3 30.0 - 36.0 g/dL   RDW 15.1 11.5 - 15.5 %   Platelets 120 (L) 150 - 400 K/uL   nRBC 0.0 0.0 - 0.2 %     Recent Labs    11/11/20 1538 11/12/20 0435 11/12/20 0924  WBC 9.2 5.3 6.7  HGB 8.5* 8.0* 8.3*  HCT 26.8* 25.9* 25.7*  PLT 116* 123* 120*   BMET Recent Labs    11/11/20 1340 11/12/20 0435  NA 139 138  K 3.3* 4.0  CL 102 103  CO2 28 26  GLUCOSE 107* 213*  BUN 18 19  CREATININE 0.91 0.95  CALCIUM 8.5* 8.4*   LFT Recent Labs    11/11/20 1340  PROT 5.7*  ALBUMIN 2.9*  AST 23  ALT 27  ALKPHOS 85  BILITOT 1.3*   PT/INR Recent Labs    11/11/20 1516  LABPROT 18.1*  INR 1.5*   Hepatitis Panel No results for input(s): HEPBSAG, HCVAB, HEPAIGM, HEPBIGM in the last 72 hours. C-Diff No results for input(s): CDIFFTOX in the last 72 hours. No results for input(s): CDIFFPCR in the last 72 hours.   Studies/Results: US Venous Img Lower Bilateral (DVT)  Result Date: 11/11/2020 CLINICAL DATA:  Shortness of breath EXAM: Bilateral LOWER EXTREMITY VENOUS DOPPLER ULTRASOUND TECHNIQUE: Gray-scale sonography with compression, as well as color and duplex ultrasound, were performed to evaluate the deep venous system(s) from the level of the common femoral vein through the popliteal and proximal calf veins. COMPARISON:  None. FINDINGS: VENOUS Normal compressibility of the  common femoral, superficial femoral, and popliteal veins, as well as the visualized calf veins. Visualized portions of profunda femoral vein and great saphenous vein unremarkable. No filling defects to suggest DVT on grayscale or color Doppler imaging. Doppler waveforms show normal direction of venous flow, normal respiratory plasticity and response to augmentation. Limited views of the contralateral common femoral vein are unremarkable. OTHER None. Limitations: none IMPRESSION: Negative. Electronically Signed   By: Merilyn Baba M.D.   On: 11/11/2020 18:07   DG Chest Portable 1 View  Result Date: 11/11/2020 CLINICAL DATA:  Shortness of breath EXAM: PORTABLE CHEST 1 VIEW COMPARISON:  Previous studies including the examination of 10/29/2020 FINDINGS: Transverse diameter of heart is slightly increased. Thoracic aorta is tortuous and ectatic. There is prominence of interstitial markings in the parahilar regions with interval worsening. There is faint haziness in the lateral aspect of left mid and both lower lung fields. There is a small smooth marginated radiopacity, possibly in the lateral aspect of minor fissure in the right mid lung fields. There is pleural thickening in both apices, more so on the left side. There is no pneumothorax. Lateral CP angles are clear. IMPRESSION: There is increase in interstitial markings in the parahilar regions suggesting interstitial edema or interstitial pneumonitis. Part of this finding is probably underlying scarring. There is diffuse haziness in the lateral aspect of left mid and both lower lung fields which may be due to pleural thickening or loculated pleural effusions or pneumonia. Follow-up chest radiographs and CT as clinically warranted should be considered. Electronically Signed   By: Elmer Picker M.D.   On: 11/11/2020 13:38    Scheduled Inpatient Medications:    atorvastatin  40 mg Oral QPC supper   ferrous gluconate  324 mg Oral QPC supper   furosemide   20 mg Intravenous Q12H   ipratropium-albuterol  3 mL Nebulization Q4H   metoprolol tartrate  12.5 mg Oral BID   mometasone-formoterol  2 puff Inhalation BID   montelukast  10 mg Oral QHS   pantoprazole (PROTONIX) IV  40 mg Intravenous Q12H   tamsulosin  0.4 mg Oral Daily    Continuous Inpatient Infusions:    PRN Inpatient Medications:  acetaminophen, albuterol, dextromethorphan-guaiFENesin, hydrALAZINE, nitroGLYCERIN, ondansetron (ZOFRAN) IV, phenazopyridine, polyethylene glycol  Miscellaneous: N/A  Assessment:  Anemia secondary to GI blood loss. Stable. Symptoms of angina. NTG given. Afib w/ RVR. HR 130's. On IV metoprolol. Cardiology following.  Plan:  Continue fluid and electrolyte management. Continue management of CHF, demand ischemia per cardiology. NO plans for endoluminal evaluation in the near future secondary to the above, unless overt gastrointestinal bleeding ensues. Will see again if clinical changes occur during this hospitalization that would warrant an urgent GI evaluation. GI sign off for now. Call us back if we can help.  Jestine Bicknell K. Alice Reichert, M.D. 11/12/2020, 2:54 PM

## 2020-11-12 NOTE — Consult Note (Signed)
[facility]  CARDIOLOGY CONSULT NOTE  Patient ID: [name] Browning MRN: [id number] DOB/AGE: 79/23/1942 79 y.o.  Admit date: 11/11/2020 Referring Physician Ivor Costa Primary Physician [place], [place] Primary Cardiologist Jordan Hawks Reason for Consultation Elevated troponin  HPI:  [name] Browning is a 80 year old male with history of CAD (nonobstructive calcified coronary disease), atrial fibrillation on Eliquis, HFpEF, hypertension, TIA in 2016, COPD on 2 L who was admitted to the hospital with shortness of breath.  Cardiology is consulted for evaluation of an elevated troponin.  Since he was discharged on Friday, and his medications were changed at discharge. His metoprolol XL 25 mg was stopped, though they restarted this because his HR was high at home. His lasix was also stopped. He has been having increased shortness of breath, and increased LE edema. He was having some black colored stools as well.   He is admitted to the hospital 2 times in the last month.  First admission was from 10/20/2020 to 10/31/2020 when he was admitted with respiratory failure due to COPD and pneumonia, as well as an obstructive left renal stone.  He developed hypotension and worsening respiratory distress requiring pressors and BiPAP.  Blood cultures grew positive for Enterococcus faecalis and staph hemolyticus.  He was treated with antibiotics ending 11/03/2020.  The patient was recently admitted to the hospital from 10/25-10/28 with complaints of dysphagia.  He was seen by gastroenterology who underwent an EGD with no significant findings.  Since that time he has had progressive worsening shortness of breath, eventually presented to the emergency department where he was found to have an oxygen saturation of 88% on his home 2 L of oxygen.  His labs are otherwise notable for a troponin of 620, BNP of 220, hemoglobin of 8.2 (9.7 on 11/06/2020).  Chest x-ray shows diffuse bilateral infiltrates which could be consistent with  edema but some overlying scar.  Interval history: - Says breathing is better today.  - Had an episode of arm pain radiating to shoulder. EKG showed lateral St depressions. Now resolved.   Review of systems complete and found to be negative unless listed above     Past Medical History:  Diagnosis Date   Anginal pain ([place])    Anxiety    Aortic atherosclerosis ([place])    Atrial fibrillation ([place]) 01/2019   Bilateral carpal tunnel syndrome    CAD (coronary artery disease)    a.) LHC 2004 --> normal coronaries. b.) normal stress test in 2007 and 2011; c.) Lexiscan 05/20/2014 --> LVEF 55-65%; no significant stress induced ischemia/arrythmia. d.) CT chest 03/25/2019 --> coronaries carcified.   Carotid atherosclerosis, bilateral    Carpal tunnel syndrome, bilateral    Chronic anticoagulation    a.) ASA + apixaban   COPD (chronic obstructive pulmonary disease) (HCC)    CVA (cerebral vascular accident) ([place])    Degenerative disc disease, cervical    Diastolic dysfunction    a.) TTE 05/29/2014 --> LVEF 60-65%; G1DD.   DVT (deep venous thrombosis) (HCC)    GERD (gastroesophageal reflux disease)    History of 2019 novel coronavirus disease (COVID-19) 02/08/2019   HLD (hyperlipidemia)    Hypertension    Kidney stones    Osteoarthritis of right shoulder    Pneumonia    Respiratory failure, acute ([place]) 09/20/2020   a.) severe respiratory distress 1 hour after urological surgery. CXR (+) for acute pulmonary edema. Transferred to ICU and placed on NIPPV. Questionable aspiration PNA. (+) A.fib with RVR. Improved with ABX, diuresis, and  amiodarone.   Skin cancer of face    a.) RIGHT ear and RIGHT forehead; excised.   Syncope    TIA (transient ischemic attack) 2016   Valvular regurgitation    a.) TTE 05/26/2014 --> LVEF 60-65%; trivial MR, mild TR; no AR or PR. b.) TTE 04/20/2015 --> LVEF 55-60%; trivial MR and PR; no AR or TR.    Past Surgical History:  Procedure Laterality Date   CARDIAC  CATHETERIZATION  2004   CARPAL TUNNEL RELEASE Right 06/11/2013   CYSTOSCOPY W/ RETROGRADES  10/16/2020   Procedure: CYSTOSCOPY WITH RETROGRADE PYELOGRAM;  Surgeon: Billey Co, MD;  Location: [facility] ORS;  Service: Urology;;   CYSTOSCOPY W/ URETERAL STENT PLACEMENT Left 09/20/2020   Procedure: CYSTOSCOPY WITH RETROGRADE PYELOGRAM/URETERAL STENT PLACEMENT;  Surgeon: Janith Lima, MD;  Location: [facility] ORS;  Service: Urology;  Laterality: Left;   CYSTOSCOPY/URETEROSCOPY/HOLMIUM LASER/STENT PLACEMENT Left 10/16/2020   Procedure: CYSTOSCOPY/URETEROSCOPY/HOLMIUM LASER/STENT PLACEMENT;  Surgeon: Billey Co, MD;  Location: [facility] ORS;  Service: Urology;  Laterality: Left;   ESOPHAGOGASTRODUODENOSCOPY N/A 11/06/2020   Procedure: ESOPHAGOGASTRODUODENOSCOPY (EGD);  Surgeon: Lucilla Lame, MD;  Location: [facility] ENDOSCOPY;  Service: Endoscopy;  Laterality: N/A;   SHOULDER ARTHROSCOPY WITH OPEN ROTATOR CUFF REPAIR Right 08/24/2017   Procedure: SHOULDER ARTHROSCOPY WITH OPEN ROTATOR CUFF REPAIR;  Surgeon: Corky Mull, MD;  Location: [facility] ORS;  Service: Orthopedics;  Laterality: Right;   SKIN CANCER EXCISION  12/01/2016   right ear    SKIN CANCER EXCISION     remove from the right side of the face    THROMBECTOMY Right 2004   leg   THYROIDECTOMY  1950   Not sure if total or partial thyroidectomy.    (Not in a hospital admission)  Social History   Socioeconomic History   Marital status: Legally Separated    Spouse name: Not on file   Number of children: Not on file   Years of education: Not on file   Highest education level: Not on file  Occupational History   Not on file  Tobacco Use   Smoking status: Former    Packs/day: 1.00    Years: 46.00    Pack years: 46.00    Types: Cigarettes    Start date: 01/10/1957    Quit date: 02/11/2003    Years since quitting: 17.7   Smokeless tobacco: Former    Types: Snuff    Quit date: 02/11/2003  Vaping Use   Vaping Use: Never used  Substance and Sexual  Activity   Alcohol use: Not Currently    Alcohol/week: 7.0 standard drinks    Types: 7 Cans of beer per week   Drug use: No   Sexual activity: Yes    Partners: Female  Other Topics Concern   Not on file  Social History Narrative   Live with grandaughter, April   Social Determinants of Health   Financial Resource Strain: Not on file  Food Insecurity: Not on file  Transportation Needs: Not on file  Physical Activity: Not on file  Stress: Not on file  Social Connections: Not on file  Intimate Partner Violence: Not on file    Family History  Problem Relation Age of Onset   Hypertension Mother    Heart disease Mother    CAD Father    Heart attack Father       Review of systems complete and found to be negative unless listed above      PHYSICAL EXAM  General: Well developed, well  nourished, in no acute distress HEENT:  Normocephalic and atramatic Neck:  No JVD.  Lungs: Clear bilaterally to auscultation and percussion. Heart: Irregularly irregular . Normal S1 and S2 without gallops or murmurs.  Abdomen: Bowel sounds are positive, abdomen soft and non-tender  Msk:  Back normal, normal gait. Normal strength and tone for age. Extremities: No clubbing, cyanosis or edema.   Neuro: Alert and oriented X 3. Psych:  Good affect, responds appropriately  Labs:   Lab Results  Component Value Date   WBC 6.7 11/12/2020   HGB 8.3 (L) 11/12/2020   HCT 25.7 (L) 11/12/2020   MCV 95.5 11/12/2020   PLT 120 (L) 11/12/2020    Recent Labs  Lab 11/11/20 1340 11/12/20 0435  NA 139 138  K 3.3* 4.0  CL 102 103  CO2 28 26  BUN 18 19  CREATININE 0.91 0.95  CALCIUM 8.5* 8.4*  PROT 5.7*  --   BILITOT 1.3*  --   ALKPHOS 85  --   ALT 27  --   AST 23  --   GLUCOSE 107* 213*    Lab Results  Component Value Date   CKTOTAL 44 01/26/2011   CKMB < 0.5 (L) 01/26/2011   TROPONINI <0.03 05/19/2014     Lab Results  Component Value Date   CHOL 141 11/12/2020   CHOL 193 12/07/2016    CHOL 164 06/08/2016   Lab Results  Component Value Date   HDL 38 (L) 11/12/2020   HDL 72 12/07/2016   HDL 63 06/08/2016   Lab Results  Component Value Date   LDLCALC 94 11/12/2020   LDLCALC 105 (H) 12/07/2016   LDLCALC 92 06/08/2016   Lab Results  Component Value Date   TRIG 47 11/12/2020   TRIG 78 03/25/2019   TRIG 74 12/07/2016   Lab Results  Component Value Date   CHOLHDL 3.7 11/12/2020   CHOLHDL 2.7 12/07/2016   CHOLHDL 2.6 06/08/2016   No results found for: LDLDIRECT    Radiology: DG Abdomen 1 View  Result Date: 10/20/2020 CLINICAL DATA:  Sepsis.  Evaluate for stent placement. EXAM: ABDOMEN - 1 VIEW COMPARISON:  Fluoroscopy 10/16/2020 FINDINGS: A left ureteral stent is present. The proximal pigtail projects to the left of L3. This is lower than on the previous fluoroscopic image. The distal pigtail projects over the perineum below the level of the symphysis pubis. This suggests inferior migration of the stent with the distal pigtail probably either in the urethra or possibly in a prolapsed bladder. Scattered gas and stool throughout the colon and small bowel. No small or large bowel distention. No radiopaque stones are identified. Degenerative changes in the spine and hips. IMPRESSION: Left ureteral stent is present. The stent appears to have migrated inferiorly with the proximal pigtail lower than on prior study and the distal pigtail projecting below the level of the symphysis pubis, possibly in the urethra or in a prolapsed bladder. Electronically Signed   By: Lucienne Capers M.D.   On: 10/20/2020 20:18   CT HEAD WO CONTRAST (5MM)  Result Date: 11/05/2020 CLINICAL DATA:  Transient ischemic attack (TIA) EXAM: CT HEAD WITHOUT CONTRAST TECHNIQUE: Contiguous axial images were obtained from the base of the skull through the vertex without intravenous contrast. COMPARISON:  None. FINDINGS: Brain: There is no acute intracranial hemorrhage, mass effect, or edema. Gray-white  differentiation is preserved. There is no extra-axial fluid collection. Ventricles and sulci are within normal limits in size and configuration. Vascular: There is atherosclerotic calcification  at the skull base. Skull: Calvarium is unremarkable. Sinuses/Orbits: No acute finding. Other: None. IMPRESSION: No acute intracranial abnormality. Electronically Signed   By: Macy Mis M.D.   On: 11/05/2020 14:20   CT ABDOMEN PELVIS W CONTRAST  Result Date: 11/03/2020 CLINICAL DATA:  Nausea and vomiting EXAM: CT ABDOMEN AND PELVIS WITH CONTRAST TECHNIQUE: Multidetector CT imaging of the abdomen and pelvis was performed using the standard protocol following bolus administration of intravenous contrast. CONTRAST:  166mL OMNIPAQUE IOHEXOL 300 MG/ML  SOLN COMPARISON:  CT abdomen and pelvis dated October 20, 2020 FINDINGS: Lower chest: Cardiomegaly. Emphysema. Solid pulmonary nodules of the left lower lobe. Largest measures up to 7 mm and is located on series 2, image 98. Hepatobiliary: No focal liver abnormality is seen. No gallstones, gallbladder wall thickening, or biliary dilatation. Pancreas: Unremarkable. No pancreatic ductal dilatation or surrounding inflammatory changes. Spleen: Normal in size without focal abnormality. Adrenals/Urinary Tract: Bilateral adrenal glands are unremarkable. No hydronephrosis. Bilateral nonobstructing stones, left greater than right. Bladder is unremarkable. Stomach/Bowel: Stomach is within normal limits. Appendix appears normal. Diverticulosis. No evidence of bowel wall thickening, distention, or inflammatory changes. Vascular/Lymphatic: Aortic atherosclerosis. No enlarged abdominal or pelvic lymph nodes. Reproductive: Prostatomegaly, measuring up to 5.8 cm. Other: Small right greater than left fat containing inguinal hernias. Musculoskeletal: Unchanged L4 compression deformity. No aggressive appearing osseous lesions. IMPRESSION: Diverticulosis with no evidence of diverticulitis. No  acute findings abdomen or pelvis. Solid pulmonary nodules of the left lower lobe, largest measures up to 7 mm. Non-contrast chest CT at 6-12 months is recommended. If the nodule is stable at time of repeat CT, then future CT at 18-24 months (from today's scan) is considered optional for low-risk patients, but is recommended for high-risk patients. This recommendation follows the consensus statement: Guidelines for Management of Incidental Pulmonary Nodules Detected on CT Images: From the Fleischner Society 2017; Radiology 2017; 284:228-243. Aortic Atherosclerosis (ICD10-I70.0) and Emphysema (ICD10-J43.9). Electronically Signed   By: Yetta Glassman M.D.   On: 11/03/2020 15:48   US Venous Img Lower Bilateral (DVT)  Result Date: 11/11/2020 CLINICAL DATA:  Shortness of breath EXAM: Bilateral LOWER EXTREMITY VENOUS DOPPLER ULTRASOUND TECHNIQUE: Gray-scale sonography with compression, as well as color and duplex ultrasound, were performed to evaluate the deep venous system(s) from the level of the common femoral vein through the popliteal and proximal calf veins. COMPARISON:  None. FINDINGS: VENOUS Normal compressibility of the common femoral, superficial femoral, and popliteal veins, as well as the visualized calf veins. Visualized portions of profunda femoral vein and great saphenous vein unremarkable. No filling defects to suggest DVT on grayscale or color Doppler imaging. Doppler waveforms show normal direction of venous flow, normal respiratory plasticity and response to augmentation. Limited views of the contralateral common femoral vein are unremarkable. OTHER None. Limitations: none IMPRESSION: Negative. Electronically Signed   By: Merilyn Baba M.D.   On: 11/11/2020 18:07   DG Chest Portable 1 View  Result Date: 11/11/2020 CLINICAL DATA:  Shortness of breath EXAM: PORTABLE CHEST 1 VIEW COMPARISON:  Previous studies including the examination of 10/29/2020 FINDINGS: Transverse diameter of heart is  slightly increased. Thoracic aorta is tortuous and ectatic. There is prominence of interstitial markings in the parahilar regions with interval worsening. There is faint haziness in the lateral aspect of left mid and both lower lung fields. There is a small smooth marginated radiopacity, possibly in the lateral aspect of minor fissure in the right mid lung fields. There is pleural thickening in both apices, more so  on the left side. There is no pneumothorax. Lateral CP angles are clear. IMPRESSION: There is increase in interstitial markings in the parahilar regions suggesting interstitial edema or interstitial pneumonitis. Part of this finding is probably underlying scarring. There is diffuse haziness in the lateral aspect of left mid and both lower lung fields which may be due to pleural thickening or loculated pleural effusions or pneumonia. Follow-up chest radiographs and CT as clinically warranted should be considered. Electronically Signed   By: Elmer Picker M.D.   On: 11/11/2020 13:38   DG Chest Port 1 View  Result Date: 10/29/2020 CLINICAL DATA:  Shortness of breath EXAM: PORTABLE CHEST 1 VIEW COMPARISON:  10/27/2020, 10/23/2020, 08/27/2017,, CT 09/19/2018 FINDINGS: Emphysematous disease. Mild reticular opacity at the bases likely due to fibrosis. Slight decreased interstitial opacity since 10/27/2020. No new confluent airspace disease, pleural effusion or pneumothorax. Apical pleural thickening. Stable cardiomediastinal silhouette with aortic atherosclerosis. IMPRESSION: 1. Underlying chronic lung disease/emphysema with overall decreased interstitial opacity since 10/27/2020 suggesting resolving superimposed edema or inflammatory process 2. No new confluent airspace disease Electronically Signed   By: Donavan Foil M.D.   On: 10/29/2020 15:39   DG Chest Port 1 View  Result Date: 10/27/2020 CLINICAL DATA:  Chronic hypoxemia, COPD presents with weakness and shortness of breath EXAM: PORTABLE  CHEST 1 VIEW COMPARISON:  Chest radiograph 10/23/2020 FINDINGS: The cardiomediastinal silhouette is stable. There are diffusely coarsened interstitial markings throughout both lungs, overall not significantly changed since 10/23/2020. There is no new or worsening focal airspace disease. There is no pleural effusion. There is no pneumothorax. There is no acute osseous abnormality. IMPRESSION: Unchanged coarsened interstitial markings throughout both lungs since 10/23/2020 though these are increased since 10/06/2020. Findings may reflect edema or infection superimposed on chronic changes. Electronically Signed   By: Valetta Mole M.D.   On: 10/27/2020 09:05   DG Chest Port 1 View  Result Date: 10/23/2020 CLINICAL DATA:  Respiratory failure, increasing shortness of breath EXAM: PORTABLE CHEST 1 VIEW COMPARISON:  10/20/2020 FINDINGS: Unchanged mildly enlarged cardiac contour. Redemonstrated diffuse airspace and interstitial opacities bilaterally, which appear largely unchanged compared to the prior exam. No definite pleural effusion. Aortic calcifications. No acute osseous abnormality. IMPRESSION: Unchanged bilateral pulmonary opacities, which could represent edema or infection. Electronically Signed   By: Merilyn Baba M.D.   On: 10/23/2020 17:49   DG Chest Port 1 View  Result Date: 10/20/2020 CLINICAL DATA:  Questionable sepsis.  Evaluate for abnormality. EXAM: PORTABLE CHEST 1 VIEW COMPARISON:  10/06/2020 FINDINGS: Mild cardiac enlargement. Diffuse airspace and interstitial infiltration throughout both lungs, progressing since prior study. This may represent edema or multifocal pneumonia. No pleural effusions. No pneumothorax. Mediastinal contours appear intact. Calcification of the aorta. Degenerative changes in the spine and shoulders. IMPRESSION: Increasing bilateral pulmonary infiltrates, likely edema or pneumonia. Electronically Signed   By: Lucienne Capers M.D.   On: 10/20/2020 19:08   DG OR UROLOGY  CYSTO IMAGE ([facility] ONLY)  Result Date: 10/16/2020 There is no interpretation for this exam.  This order is for images obtained during a surgical procedure.  Please See "Surgeries" Tab for more information regarding the procedure.   ECHOCARDIOGRAM COMPLETE  Result Date: 10/23/2020    ECHOCARDIOGRAM REPORT   Patient Name:   [name] Browning Date of Exam: 10/23/2020 Medical Rec #:  [id number]     Height:       74.0 in Accession #:    [id number]    Weight:  180.0 lb Date of Birth:  March 17, 1940     BSA:          2.078 m Patient Age:    2 years      BP:           133/80 mmHg Patient Gender: M             HR:           95 bpm. Exam Location:  [facility] Procedure: 2D Echo, Cardiac Doppler and Color Doppler Indications:     CHF-acute diastolic V56.43  History:         Patient has prior history of Echocardiogram examinations, most                  recent 04/20/2015. Angina, COPD and TIA; Signs/Symptoms:Syncope.  Sonographer:     Sherrie Sport Referring Phys:  [id number] [place] Diagnosing Phys: Ida Rogue MD  Sonographer Comments: Suboptimal apical window. IMPRESSIONS  1. Left ventricular ejection fraction, by estimation, is 60 to 65%. The left ventricle has normal function. The left ventricle has no regional wall motion abnormalities. Left ventricular diastolic parameters are indeterminate.  2. Right ventricular systolic function is normal. The right ventricular size is normal. There is mildly elevated pulmonary artery systolic pressure.  3. Rhythm is atrial fibrillation  4. Left atrial size was moderately dilated. FINDINGS  Left Ventricle: Left ventricular ejection fraction, by estimation, is 60 to 65%. The left ventricle has normal function. The left ventricle has no regional wall motion abnormalities. The left ventricular internal cavity size was normal in size. There is  no left ventricular hypertrophy. Left ventricular diastolic parameters are indeterminate. Right Ventricle: The right ventricular size is normal. No  increase in right ventricular wall thickness. Right ventricular systolic function is normal. There is mildly elevated pulmonary artery systolic pressure. The tricuspid regurgitant velocity is 2.95  m/s, and with an assumed right atrial pressure of 5 mmHg, the estimated right ventricular systolic pressure is 41.6 mmHg. Left Atrium: Left atrial size was moderately dilated. Right Atrium: Right atrial size was normal in size. Pericardium: There is no evidence of pericardial effusion. Mitral Valve: The mitral valve is normal in structure. There is mild thickening of the mitral valve leaflet(s). No evidence of mitral valve regurgitation. No evidence of mitral valve stenosis. Tricuspid Valve: The tricuspid valve is normal in structure. Tricuspid valve regurgitation is mild . No evidence of tricuspid stenosis. Aortic Valve: The aortic valve is normal in structure. Aortic valve regurgitation is not visualized. No aortic stenosis is present. Aortic valve mean gradient measures 2.0 mmHg. Aortic valve peak gradient measures 4.7 mmHg. Aortic valve area, by VTI measures 3.04 cm. Pulmonic Valve: The pulmonic valve was normal in structure. Pulmonic valve regurgitation is not visualized. No evidence of pulmonic stenosis. Aorta: The aortic root is normal in size and structure. Venous: The inferior vena cava is normal in size with greater than 50% respiratory variability, suggesting right atrial pressure of 3 mmHg. IAS/Shunts: No atrial level shunt detected by color flow Doppler.  LEFT VENTRICLE PLAX 2D LVIDd:         4.19 cm LVIDs:         2.93 cm LV PW:         1.33 cm LV IVS:        0.95 cm LVOT diam:     2.00 cm LV SV:         55 LV SV Index:   26 LVOT Area:  3.14 cm  RIGHT VENTRICLE RV Basal diam:  4.55 cm RV S prime:     7.51 cm/s TAPSE (M-mode): 3.8 cm LEFT ATRIUM              Index        RIGHT ATRIUM           Index LA diam:        3.50 cm  1.68 cm/m   RA Area:     26.60 cm LA Vol (A2C):   93.4 ml  44.94 ml/m  RA  Volume:   96.30 ml  46.34 ml/m LA Vol (A4C):   128.0 ml 61.59 ml/m LA Biplane Vol: 113.0 ml 54.37 ml/m  AORTIC VALVE                    PULMONIC VALVE AV Area (Vmax):    2.83 cm     PV Vmax:        0.96 m/s AV Area (Vmean):   2.84 cm     PV Peak grad:   3.6 mmHg AV Area (VTI):     3.04 cm     RVOT Peak grad: 4 mmHg AV Vmax:           108.00 cm/s AV Vmean:          70.100 cm/s AV VTI:            0.181 m AV Peak Grad:      4.7 mmHg AV Mean Grad:      2.0 mmHg LVOT Vmax:         97.30 cm/s LVOT Vmean:        63.300 cm/s LVOT VTI:          0.175 m LVOT/AV VTI ratio: 0.97  AORTA Ao Root diam: 3.30 cm MITRAL VALVE                TRICUSPID VALVE MV Area (PHT): 4.52 cm     TR Peak grad:   34.8 mmHg MV Decel Time: 168 msec     TR Vmax:        295.00 cm/s MV E velocity: 134.00 cm/s                             SHUNTS                             Systemic VTI:  0.18 m                             Systemic Diam: 2.00 cm Ida Rogue MD Electronically signed by Ida Rogue MD Signature Date/Time: 10/23/2020/5:30:24 PM    Final    CT Renal Stone Study  Result Date: 10/20/2020 CLINICAL DATA:  Intermittent weakness, feeling as if he would faint. EXAM: CT ABDOMEN AND PELVIS WITHOUT CONTRAST TECHNIQUE: Multidetector CT imaging of the abdomen and pelvis was performed following the standard protocol without IV contrast. COMPARISON:  September 18, 2020 FINDINGS: Lower chest: Moderate severity chronic appearing fibrotic changes are seen involving the bilateral lung bases. Small bilateral pleural effusions are noted. Hepatobiliary: No focal liver abnormality is seen. No gallstones, gallbladder wall thickening, or biliary dilatation. Pancreas: Unremarkable. No pancreatic ductal dilatation or surrounding inflammatory changes. Spleen: A punctate calcified granuloma is seen within an otherwise normal-appearing spleen. Adrenals/Urinary Tract: Adrenal glands are  unremarkable. Kidneys are normal in size, without focal lesions. A 2  mm nonobstructing renal stone is seen within the upper pole of the right kidney. Clusters of subcentimeter nonobstructing renal stones are seen within the mid and lower left kidney. A left-sided endo ureteral stent is in place. The proximal portion of the stent is seen within the proximal left ureter, just beyond the left UPJ. The distal end extends into the urinary bladder, continues through the prostate gland in serpentine fashion to the proximal to mid portion of the urethra (axial CT image 97, CT series 2). The urinary bladder is empty and subsequently limited in evaluation. Stomach/Bowel: Stomach is within normal limits. Appendix appears normal. No evidence of bowel dilatation. Numerous diverticula are seen within the descending and sigmoid colon. Vascular/Lymphatic: Aortic atherosclerosis. No enlarged abdominal or pelvic lymph nodes. Reproductive: The prostate gland is moderately enlarged. A moderate amount of prostate gland calcification is seen. Other: A 4.4 cm x 2.7 cm fat containing right inguinal hernia is seen. No abdominopelvic ascites. Musculoskeletal: Multilevel degenerative changes seen throughout the lumbar spine. IMPRESSION: 1. Left-sided endo ureteral stent which may be malpositioned, as described above. Confirmation of the desired positioning is recommended. 2. Bilateral nonobstructing renal stones. 3. Small bilateral pleural effusions. 4. Colonic diverticulosis. Aortic Atherosclerosis (ICD10-I70.0). Electronically Signed   By: Virgina Norfolk M.D.   On: 10/20/2020 21:54    EKG: A. fib, mild inferolateral ST depressions.  Echo 10/23/20-  1. Left ventricular ejection fraction, by estimation, is 60 to 65%. The  left ventricle has normal function. The left ventricle has no regional  wall motion abnormalities. Left ventricular diastolic parameters are  indeterminate.   2. Right ventricular systolic function is normal. The right ventricular  size is normal. There is mildly elevated  pulmonary artery systolic  pressure.   3. Rhythm is atrial fibrillation   4. Left atrial size was moderately dilated.  ASSESSMENT AND PLAN:  Ismar Yabut is a 80 year old male with history of CAD (nonobstructive calcified coronary disease), atrial fibrillation on Eliquis, HFpEF, hypertension, TIA in 2016, COPD on 2 L who was admitted to the hospital with shortness of breath.  Cardiology is consulted for evaluation of an elevated troponin.  # Elevated troponin #History of atrial fibrillation #Respiratory failure The patient is presenting with several days of progressive shortness of breath, on top of a baseline history of COPD requiring 2 L of oxygen.  His BNP is 220 which is not substantially above his baseline.  In regards to his troponin, he has anemia compared to his baseline with a hemoglobin of 8.2; his hemoglobin on 11/03/2020 was 12.9. Ultimately I do not think his troponin elevation is d/t a plaque rupture event and is more likely from demand related to anemia, and volume overload from stopping his home metoprolol and lasix at discharge. He did have chest pain overnight with lateral ST depressions, but will monitor for now given decreasing troponin and low Hgb (baseline 12, now 8) - Hold home eliquis while bleeding; no indication for heparin. - Recommend aspirin 81 mg daily  - Resume metoprolol 12.5 mg BID - Maintain on telemetry - Lasix 20 mg IV BID.  - Hold on repeat echocardiogram for now  Signed: Andrez Grime MD 11/12/2020, 1:07 PM

## 2020-11-13 DIAGNOSIS — I5033 Acute on chronic diastolic (congestive) heart failure: Secondary | ICD-10-CM | POA: Diagnosis not present

## 2020-11-13 LAB — CBC
HCT: 25.1 % — ABNORMAL LOW (ref 39.0–52.0)
HCT: 26.1 % — ABNORMAL LOW (ref 39.0–52.0)
Hemoglobin: 7.7 g/dL — ABNORMAL LOW (ref 13.0–17.0)
Hemoglobin: 8.3 g/dL — ABNORMAL LOW (ref 13.0–17.0)
MCH: 29.2 pg (ref 26.0–34.0)
MCH: 29.9 pg (ref 26.0–34.0)
MCHC: 30.7 g/dL (ref 30.0–36.0)
MCHC: 31.8 g/dL (ref 30.0–36.0)
MCV: 95.1 fL (ref 80.0–100.0)
MCV: 93.9 fL (ref 80.0–100.0)
Platelets: 158 10*3/uL (ref 150–400)
Platelets: 163 10*3/uL (ref 150–400)
RBC: 2.64 MIL/uL — ABNORMAL LOW (ref 4.22–5.81)
RBC: 2.78 MIL/uL — ABNORMAL LOW (ref 4.22–5.81)
RDW: 15.9 % — ABNORMAL HIGH (ref 11.5–15.5)
RDW: 15.7 % — ABNORMAL HIGH (ref 11.5–15.5)
WBC: 9.3 10*3/uL (ref 4.0–10.5)
WBC: 9.4 10*3/uL (ref 4.0–10.5)
nRBC: 0 % (ref 0.0–0.2)
nRBC: 0 % (ref 0.0–0.2)

## 2020-11-13 LAB — BASIC METABOLIC PANEL
Anion gap: 9 (ref 5–15)
BUN: 27 mg/dL — ABNORMAL HIGH (ref 8–23)
CO2: 28 mmol/L (ref 22–32)
Calcium: 8.6 mg/dL — ABNORMAL LOW (ref 8.9–10.3)
Chloride: 99 mmol/L (ref 98–111)
Creatinine, Ser: 1.11 mg/dL (ref 0.61–1.24)
GFR, Estimated: >60 mL/min (ref >60)
Glucose, Bld: 125 mg/dL — ABNORMAL HIGH (ref 70–99)
Potassium: 4.1 mmol/L (ref 3.5–5.1)
Sodium: 136 mmol/L (ref 135–145)

## 2020-11-13 MED ORDER — SODIUM CHLORIDE 0.9 % IV SOLN: INTRAVENOUS | Status: DC

## 2020-11-13 MED ORDER — PEG 3350-KCL-NA BICARB-NACL 420 G PO SOLR
4000.0000 mL | Freq: Once | ORAL | Status: AC
  Administered 2020-11-13: 4000 mL via ORAL
  Filled 2020-11-13: qty 4000

## 2020-11-13 MED ORDER — BISACODYL 10 MG RE SUPP
10.0000 mg | Freq: Once | RECTAL | Status: AC
  Administered 2020-11-13: 10 mg via RECTAL
  Filled 2020-11-13: qty 1

## 2020-11-13 NOTE — Progress Notes (Signed)
[facility] Cardiology  CARDIOLOGY CONSULT NOTE  Patient ID: SHAMARION COOTS MRN: [id number] DOB/AGE: [birth year] 80 y.o.  Admit date: 11/11/2020 Referring Physician Ivor Costa Primary Physician [place], [place] Primary Cardiologist Jordan Hawks Reason for Consultation Elevated troponin  HPI:  Shaine Newmark is a 80 year old male with history of CAD (nonobstructive calcified coronary disease), atrial fibrillation on Eliquis, HFpEF, hypertension, TIA in 2016, COPD on 2 L who was admitted to the hospital with shortness of breath.  Cardiology is consulted for evaluation of an elevated troponin.  Since he was discharged on Friday, and his medications were changed at discharge. His metoprolol XL 25 mg was stopped, though they restarted this because his HR was high at home. His lasix was also stopped. He has been having increased shortness of breath, and increased LE edema. He was having some black colored stools as well.   He is admitted to the hospital 2 times in the last month.  First admission was from 10/20/2020 to 10/31/2020 when he was admitted with respiratory failure due to COPD and pneumonia, as well as an obstructive left renal stone.  He developed hypotension and worsening respiratory distress requiring pressors and BiPAP.  Blood cultures grew positive for Enterococcus faecalis and staph hemolyticus.  He was treated with antibiotics ending 11/03/2020.  The patient was recently admitted to the hospital from 10/25-10/28 with complaints of dysphagia.  He was seen by gastroenterology who underwent an EGD with no significant findings.  Since that time he has had progressive worsening shortness of breath, eventually presented to the emergency department where he was found to have an oxygen saturation of 88% on his home 2 L of oxygen.  His labs are otherwise notable for a troponin of 620, BNP of 220, hemoglobin of 8.2 (9.7 on 11/06/2020).  Chest x-ray shows diffuse bilateral infiltrates which could be consistent with  edema but some overlying scar.  Interval history: - No acute events. - HR improved today. - Respiratory status is back to baseline  Review of systems complete and found to be negative unless listed above     Past Medical History:  Diagnosis Date   Anginal pain ([place])    Anxiety    Aortic atherosclerosis (HCC)    Atrial fibrillation ([place]) 01/2019   Bilateral carpal tunnel syndrome    CAD (coronary artery disease)    a.) LHC 2004 --> normal coronaries. b.) normal stress test in 2007 and 2011; c.) Lexiscan 05/20/2014 --> LVEF 55-65%; no significant stress induced ischemia/arrythmia. d.) CT chest 03/25/2019 --> coronaries carcified.   Carotid atherosclerosis, bilateral    Carpal tunnel syndrome, bilateral    Chronic anticoagulation    a.) ASA + apixaban   COPD (chronic obstructive pulmonary disease) (HCC)    CVA (cerebral vascular accident) ([place])    Degenerative disc disease, cervical    Diastolic dysfunction    a.) TTE 05/29/2014 --> LVEF 60-65%; G1DD.   DVT (deep venous thrombosis) (HCC)    GERD (gastroesophageal reflux disease)    History of 2019 novel coronavirus disease (COVID-19) 02/08/2019   HLD (hyperlipidemia)    Hypertension    Kidney stones    Osteoarthritis of right shoulder    Pneumonia    Respiratory failure, acute ([place]) 09/20/2020   a.) severe respiratory distress 1 hour after urological surgery. CXR (+) for acute pulmonary edema. Transferred to ICU and placed on NIPPV. Questionable aspiration PNA. (+) A.fib with RVR. Improved with ABX, diuresis, and amiodarone.   Skin cancer of face  a.) RIGHT ear and RIGHT forehead; excised.   Syncope    TIA (transient ischemic attack) 2016   Valvular regurgitation    a.) TTE 05/26/2014 --> LVEF 60-65%; trivial MR, mild TR; no AR or PR. b.) TTE 04/20/2015 --> LVEF 55-60%; trivial MR and PR; no AR or TR.    Past Surgical History:  Procedure Laterality Date   CARDIAC CATHETERIZATION  2004   CARPAL TUNNEL RELEASE Right  06/11/2013   CYSTOSCOPY W/ RETROGRADES  10/16/2020   Procedure: CYSTOSCOPY WITH RETROGRADE PYELOGRAM;  Surgeon: Billey Co, MD;  Location: [facility] ORS;  Service: Urology;;   CYSTOSCOPY W/ URETERAL STENT PLACEMENT Left 09/20/2020   Procedure: CYSTOSCOPY WITH RETROGRADE PYELOGRAM/URETERAL STENT PLACEMENT;  Surgeon: Janith Lima, MD;  Location: [facility] ORS;  Service: Urology;  Laterality: Left;   CYSTOSCOPY/URETEROSCOPY/HOLMIUM LASER/STENT PLACEMENT Left 10/16/2020   Procedure: CYSTOSCOPY/URETEROSCOPY/HOLMIUM LASER/STENT PLACEMENT;  Surgeon: Billey Co, MD;  Location: [facility] ORS;  Service: Urology;  Laterality: Left;   ESOPHAGOGASTRODUODENOSCOPY N/A 11/06/2020   Procedure: ESOPHAGOGASTRODUODENOSCOPY (EGD);  Surgeon: Lucilla Lame, MD;  Location: [facility];  Service: Endoscopy;  Laterality: N/A;   SHOULDER ARTHROSCOPY WITH OPEN ROTATOR CUFF REPAIR Right 08/24/2017   Procedure: SHOULDER ARTHROSCOPY WITH OPEN ROTATOR CUFF REPAIR;  Surgeon: Corky Mull, MD;  Location: [facility] ORS;  Service: Orthopedics;  Laterality: Right;   SKIN CANCER EXCISION  12/01/2016   right ear    SKIN CANCER EXCISION     remove from the right side of the face    THROMBECTOMY Right 2004   leg   THYROIDECTOMY  1950   Not sure if total or partial thyroidectomy.    Medications Prior to Admission  Medication Sig Dispense Refill Last Dose   acetaminophen (TYLENOL) 500 MG tablet Take 2 tablets (1,000 mg total) by mouth every 6 (six) hours as needed for mild pain.      apixaban (ELIQUIS) 5 MG TABS tablet Take 1 tablet (5 mg total) by mouth 2 (two) times daily. 60 tablet 2    aspirin EC 81 MG tablet Take 81 mg by mouth daily after supper.      atorvastatin (LIPITOR) 40 MG tablet Take 40 mg by mouth daily after supper.      cetirizine (ZYRTEC) 10 MG tablet Take 10 mg by mouth daily.      famotidine (PEPCID) 20 MG tablet One after supper 30 tablet 11    Ferrous Gluconate (IRON) 240 (27 Fe) MG TABS Take 27 mg by mouth daily  after supper.      guaiFENesin-dextromethorphan (ROBITUSSIN DM) 100-10 MG/5ML syrup Take 5 mLs by mouth every 4 (four) hours as needed for cough. 118 mL 0    montelukast (SINGULAIR) 10 MG tablet Take 10 mg by mouth at bedtime.      pantoprazole (PROTONIX) 40 MG tablet Take 40 mg by mouth daily after supper.      phenazopyridine (PYRIDIUM) 200 MG tablet Take 1 tablet (200 mg total) by mouth 3 (three) times daily as needed (bladder pain). 10 tablet 0    polyethylene glycol (MIRALAX / GLYCOLAX) 17 g packet Take 17 g by mouth daily as needed for mild constipation or moderate constipation. (Patient taking differently: Take 17 g by mouth daily.) 14 each 0    SYMBICORT 160-4.5 MCG/ACT inhaler Inhale 2 puffs into the lungs 2 (two) times daily as needed (shortness of breath).      tamsulosin (FLOMAX) 0.4 MG CAPS capsule TAKE 1 CAPSULE BY MOUTH EVERY DAY 14 capsule 0  Social History   Socioeconomic History   Marital status: Legally Separated    Spouse name: Not on file   Number of children: Not on file   Years of education: Not on file   Highest education level: Not on file  Occupational History   Not on file  Tobacco Use   Smoking status: Former    Packs/day: 1.00    Years: 46.00    Pack years: 46.00    Types: Cigarettes    Start date: 01/10/1957    Quit date: 02/11/2003    Years since quitting: 17.7   Smokeless tobacco: Former    Types: Snuff    Quit date: 02/11/2003  Vaping Use   Vaping Use: Never used  Substance and Sexual Activity   Alcohol use: Not Currently    Alcohol/week: 7.0 standard drinks    Types: 7 Cans of beer per week   Drug use: No   Sexual activity: Yes    Partners: Female  Other Topics Concern   Not on file  Social History Narrative   Live with grandaughter, April   Social Determinants of Health   Financial Resource Strain: Not on file  Food Insecurity: Not on file  Transportation Needs: Not on file  Physical Activity: Not on file  Stress: Not on file   Social Connections: Not on file  Intimate Partner Violence: Not on file    Family History  Problem Relation Age of Onset   Hypertension Mother    Heart disease Mother    CAD Father    Heart attack Father       Review of systems complete and found to be negative unless listed above      PHYSICAL EXAM  General: Well developed, well nourished, in no acute distress HEENT:  Normocephalic and atramatic Neck:  No JVD.  Lungs: Clear bilaterally to auscultation and percussion. Heart: Irregularly irregular . Normal S1 and S2 without gallops or murmurs.  Abdomen: Bowel sounds are positive, abdomen soft and non-tender  Msk:  Back normal, normal gait. Normal strength and tone for age. Extremities: No clubbing, cyanosis or edema.   Neuro: Alert and oriented X 3. Psych:  Good affect, responds appropriately  Labs:   Lab Results  Component Value Date   WBC 9.3 11/13/2020   HGB 7.7 (L) 11/13/2020   HCT 25.1 (L) 11/13/2020   MCV 95.1 11/13/2020   PLT 158 11/13/2020    Recent Labs  Lab 11/11/20 1340 11/12/20 0435  NA 139 138  K 3.3* 4.0  CL 102 103  CO2 28 26  BUN 18 19  CREATININE 0.91 0.95  CALCIUM 8.5* 8.4*  PROT 5.7*  --   BILITOT 1.3*  --   ALKPHOS 85  --   ALT 27  --   AST 23  --   GLUCOSE 107* 213*    Lab Results  Component Value Date   CKTOTAL 44 01/26/2011   CKMB < 0.5 (L) 01/26/2011   TROPONINI <0.03 05/19/2014     Lab Results  Component Value Date   CHOL 141 11/12/2020   CHOL 193 12/07/2016   CHOL 164 06/08/2016   Lab Results  Component Value Date   HDL 38 (L) 11/12/2020   HDL 72 12/07/2016   HDL 63 06/08/2016   Lab Results  Component Value Date   LDLCALC 94 11/12/2020   LDLCALC 105 (H) 12/07/2016   LDLCALC 92 06/08/2016   Lab Results  Component Value Date   TRIG 47 11/12/2020  TRIG 78 03/25/2019   TRIG 74 12/07/2016   Lab Results  Component Value Date   CHOLHDL 3.7 11/12/2020   CHOLHDL 2.7 12/07/2016   CHOLHDL 2.6 06/08/2016    No results found for: LDLDIRECT    Radiology: DG Abdomen 1 View  Result Date: 10/20/2020 CLINICAL DATA:  Sepsis.  Evaluate for stent placement. EXAM: ABDOMEN - 1 VIEW COMPARISON:  Fluoroscopy 10/16/2020 FINDINGS: A left ureteral stent is present. The proximal pigtail projects to the left of L3. This is lower than on the previous fluoroscopic image. The distal pigtail projects over the perineum below the level of the symphysis pubis. This suggests inferior migration of the stent with the distal pigtail probably either in the urethra or possibly in a prolapsed bladder. Scattered gas and stool throughout the colon and small bowel. No small or large bowel distention. No radiopaque stones are identified. Degenerative changes in the spine and hips. IMPRESSION: Left ureteral stent is present. The stent appears to have migrated inferiorly with the proximal pigtail lower than on prior study and the distal pigtail projecting below the level of the symphysis pubis, possibly in the urethra or in a prolapsed bladder. Electronically Signed   By: Lucienne Capers M.D.   On: 10/20/2020 20:18   CT HEAD WO CONTRAST (5MM)  Result Date: 11/05/2020 CLINICAL DATA:  Transient ischemic attack (TIA) EXAM: CT HEAD WITHOUT CONTRAST TECHNIQUE: Contiguous axial images were obtained from the base of the skull through the vertex without intravenous contrast. COMPARISON:  None. FINDINGS: Brain: There is no acute intracranial hemorrhage, mass effect, or edema. Gray-white differentiation is preserved. There is no extra-axial fluid collection. Ventricles and sulci are within normal limits in size and configuration. Vascular: There is atherosclerotic calcification at the skull base. Skull: Calvarium is unremarkable. Sinuses/Orbits: No acute finding. Other: None. IMPRESSION: No acute intracranial abnormality. Electronically Signed   By: Macy Mis M.D.   On: 11/05/2020 14:20   CT ABDOMEN PELVIS W CONTRAST  Result Date:  11/03/2020 CLINICAL DATA:  Nausea and vomiting EXAM: CT ABDOMEN AND PELVIS WITH CONTRAST TECHNIQUE: Multidetector CT imaging of the abdomen and pelvis was performed using the standard protocol following bolus administration of intravenous contrast. CONTRAST:  156mL OMNIPAQUE IOHEXOL 300 MG/ML  SOLN COMPARISON:  CT abdomen and pelvis dated October 20, 2020 FINDINGS: Lower chest: Cardiomegaly. Emphysema. Solid pulmonary nodules of the left lower lobe. Largest measures up to 7 mm and is located on series 2, image 98. Hepatobiliary: No focal liver abnormality is seen. No gallstones, gallbladder wall thickening, or biliary dilatation. Pancreas: Unremarkable. No pancreatic ductal dilatation or surrounding inflammatory changes. Spleen: Normal in size without focal abnormality. Adrenals/Urinary Tract: Bilateral adrenal glands are unremarkable. No hydronephrosis. Bilateral nonobstructing stones, left greater than right. Bladder is unremarkable. Stomach/Bowel: Stomach is within normal limits. Appendix appears normal. Diverticulosis. No evidence of bowel wall thickening, distention, or inflammatory changes. Vascular/Lymphatic: Aortic atherosclerosis. No enlarged abdominal or pelvic lymph nodes. Reproductive: Prostatomegaly, measuring up to 5.8 cm. Other: Small right greater than left fat containing inguinal hernias. Musculoskeletal: Unchanged L4 compression deformity. No aggressive appearing osseous lesions. IMPRESSION: Diverticulosis with no evidence of diverticulitis. No acute findings abdomen or pelvis. Solid pulmonary nodules of the left lower lobe, largest measures up to 7 mm. Non-contrast chest CT at 6-12 months is recommended. If the nodule is stable at time of repeat CT, then future CT at 18-24 months (from today's scan) is considered optional for low-risk patients, but is recommended for high-risk patients. This recommendation follows the  consensus statement: Guidelines for Management of Incidental Pulmonary Nodules  Detected on CT Images: From the Fleischner Society 2017; Radiology 2017; 284:228-243. Aortic Atherosclerosis (ICD10-I70.0) and Emphysema (ICD10-J43.9). Electronically Signed   By: Yetta Glassman M.D.   On: 11/03/2020 15:48   US Venous Img Lower Bilateral (DVT)  Result Date: 11/11/2020 CLINICAL DATA:  Shortness of breath EXAM: Bilateral LOWER EXTREMITY VENOUS DOPPLER ULTRASOUND TECHNIQUE: Gray-scale sonography with compression, as well as color and duplex ultrasound, were performed to evaluate the deep venous system(s) from the level of the common femoral vein through the popliteal and proximal calf veins. COMPARISON:  None. FINDINGS: VENOUS Normal compressibility of the common femoral, superficial femoral, and popliteal veins, as well as the visualized calf veins. Visualized portions of profunda femoral vein and great saphenous vein unremarkable. No filling defects to suggest DVT on grayscale or color Doppler imaging. Doppler waveforms show normal direction of venous flow, normal respiratory plasticity and response to augmentation. Limited views of the contralateral common femoral vein are unremarkable. OTHER None. Limitations: none IMPRESSION: Negative. Electronically Signed   By: Merilyn Baba M.D.   On: 11/11/2020 18:07   DG Chest Portable 1 View  Result Date: 11/11/2020 CLINICAL DATA:  Shortness of breath EXAM: PORTABLE CHEST 1 VIEW COMPARISON:  Previous studies including the examination of 10/29/2020 FINDINGS: Transverse diameter of heart is slightly increased. Thoracic aorta is tortuous and ectatic. There is prominence of interstitial markings in the parahilar regions with interval worsening. There is faint haziness in the lateral aspect of left mid and both lower lung fields. There is a small smooth marginated radiopacity, possibly in the lateral aspect of minor fissure in the right mid lung fields. There is pleural thickening in both apices, more so on the left side. There is no pneumothorax.  Lateral CP angles are clear. IMPRESSION: There is increase in interstitial markings in the parahilar regions suggesting interstitial edema or interstitial pneumonitis. Part of this finding is probably underlying scarring. There is diffuse haziness in the lateral aspect of left mid and both lower lung fields which may be due to pleural thickening or loculated pleural effusions or pneumonia. Follow-up chest radiographs and CT as clinically warranted should be considered. Electronically Signed   By: Elmer Picker M.D.   On: 11/11/2020 13:38   DG Chest Port 1 View  Result Date: 10/29/2020 CLINICAL DATA:  Shortness of breath EXAM: PORTABLE CHEST 1 VIEW COMPARISON:  10/27/2020, 10/23/2020, 08/27/2017,, CT 09/19/2018 FINDINGS: Emphysematous disease. Mild reticular opacity at the bases likely due to fibrosis. Slight decreased interstitial opacity since 10/27/2020. No new confluent airspace disease, pleural effusion or pneumothorax. Apical pleural thickening. Stable cardiomediastinal silhouette with aortic atherosclerosis. IMPRESSION: 1. Underlying chronic lung disease/emphysema with overall decreased interstitial opacity since 10/27/2020 suggesting resolving superimposed edema or inflammatory process 2. No new confluent airspace disease Electronically Signed   By: Donavan Foil M.D.   On: 10/29/2020 15:39   DG Chest Port 1 View  Result Date: 10/27/2020 CLINICAL DATA:  Chronic hypoxemia, COPD presents with weakness and shortness of breath EXAM: PORTABLE CHEST 1 VIEW COMPARISON:  Chest radiograph 10/23/2020 FINDINGS: The cardiomediastinal silhouette is stable. There are diffusely coarsened interstitial markings throughout both lungs, overall not significantly changed since 10/23/2020. There is no new or worsening focal airspace disease. There is no pleural effusion. There is no pneumothorax. There is no acute osseous abnormality. IMPRESSION: Unchanged coarsened interstitial markings throughout both lungs since  10/23/2020 though these are increased since 10/06/2020. Findings may reflect edema or infection superimposed on  chronic changes. Electronically Signed   By: Valetta Mole M.D.   On: 10/27/2020 09:05   DG Chest Port 1 View  Result Date: 10/23/2020 CLINICAL DATA:  Respiratory failure, increasing shortness of breath EXAM: PORTABLE CHEST 1 VIEW COMPARISON:  10/20/2020 FINDINGS: Unchanged mildly enlarged cardiac contour. Redemonstrated diffuse airspace and interstitial opacities bilaterally, which appear largely unchanged compared to the prior exam. No definite pleural effusion. Aortic calcifications. No acute osseous abnormality. IMPRESSION: Unchanged bilateral pulmonary opacities, which could represent edema or infection. Electronically Signed   By: Merilyn Baba M.D.   On: 10/23/2020 17:49   DG Chest Port 1 View  Result Date: 10/20/2020 CLINICAL DATA:  Questionable sepsis.  Evaluate for abnormality. EXAM: PORTABLE CHEST 1 VIEW COMPARISON:  10/06/2020 FINDINGS: Mild cardiac enlargement. Diffuse airspace and interstitial infiltration throughout both lungs, progressing since prior study. This may represent edema or multifocal pneumonia. No pleural effusions. No pneumothorax. Mediastinal contours appear intact. Calcification of the aorta. Degenerative changes in the spine and shoulders. IMPRESSION: Increasing bilateral pulmonary infiltrates, likely edema or pneumonia. Electronically Signed   By: Lucienne Capers M.D.   On: 10/20/2020 19:08   DG OR UROLOGY CYSTO IMAGE ([facility] ONLY)  Result Date: 10/16/2020 There is no interpretation for this exam.  This order is for images obtained during a surgical procedure.  Please See "Surgeries" Tab for more information regarding the procedure.   ECHOCARDIOGRAM COMPLETE  Result Date: 10/23/2020    ECHOCARDIOGRAM REPORT   Patient Name:   KOLYN ROZARIO Date of Exam: 10/23/2020 Medical Rec #:  [id number]     Height:       74.0 in Accession #:    [id number]    Weight:        180.0 lb Date of Birth:  [birth year]     BSA:          2.078 m Patient Age:    43 years      BP:           133/80 mmHg Patient Gender: M             HR:           95 bpm. Exam Location:  [facility] Procedure: 2D Echo, Cardiac Doppler and Color Doppler Indications:     CHF-acute diastolic F81.82  History:         Patient has prior history of Echocardiogram examinations, most                  recent 04/20/2015. Angina, COPD and TIA; Signs/Symptoms:Syncope.  Sonographer:     Sherrie Sport Referring Phys:  [id number] [place] Diagnosing Phys: Ida Rogue MD  Sonographer Comments: Suboptimal apical window. IMPRESSIONS  1. Left ventricular ejection fraction, by estimation, is 60 to 65%. The left ventricle has normal function. The left ventricle has no regional wall motion abnormalities. Left ventricular diastolic parameters are indeterminate.  2. Right ventricular systolic function is normal. The right ventricular size is normal. There is mildly elevated pulmonary artery systolic pressure.  3. Rhythm is atrial fibrillation  4. Left atrial size was moderately dilated. FINDINGS  Left Ventricle: Left ventricular ejection fraction, by estimation, is 60 to 65%. The left ventricle has normal function. The left ventricle has no regional wall motion abnormalities. The left ventricular internal cavity size was normal in size. There is  no left ventricular hypertrophy. Left ventricular diastolic parameters are indeterminate. Right Ventricle: The right ventricular size is normal. No increase in right ventricular wall thickness. Right ventricular  systolic function is normal. There is mildly elevated pulmonary artery systolic pressure. The tricuspid regurgitant velocity is 2.95  m/s, and with an assumed right atrial pressure of 5 mmHg, the estimated right ventricular systolic pressure is 56.3 mmHg. Left Atrium: Left atrial size was moderately dilated. Right Atrium: Right atrial size was normal in size. Pericardium: There is no evidence of  pericardial effusion. Mitral Valve: The mitral valve is normal in structure. There is mild thickening of the mitral valve leaflet(s). No evidence of mitral valve regurgitation. No evidence of mitral valve stenosis. Tricuspid Valve: The tricuspid valve is normal in structure. Tricuspid valve regurgitation is mild . No evidence of tricuspid stenosis. Aortic Valve: The aortic valve is normal in structure. Aortic valve regurgitation is not visualized. No aortic stenosis is present. Aortic valve mean gradient measures 2.0 mmHg. Aortic valve peak gradient measures 4.7 mmHg. Aortic valve area, by VTI measures 3.04 cm. Pulmonic Valve: The pulmonic valve was normal in structure. Pulmonic valve regurgitation is not visualized. No evidence of pulmonic stenosis. Aorta: The aortic root is normal in size and structure. Venous: The inferior vena cava is normal in size with greater than 50% respiratory variability, suggesting right atrial pressure of 3 mmHg. IAS/Shunts: No atrial level shunt detected by color flow Doppler.  LEFT VENTRICLE PLAX 2D LVIDd:         4.19 cm LVIDs:         2.93 cm LV PW:         1.33 cm LV IVS:        0.95 cm LVOT diam:     2.00 cm LV SV:         55 LV SV Index:   26 LVOT Area:     3.14 cm  RIGHT VENTRICLE RV Basal diam:  4.55 cm RV S prime:     7.51 cm/s TAPSE (M-mode): 3.8 cm LEFT ATRIUM              Index        RIGHT ATRIUM           Index LA diam:        3.50 cm  1.68 cm/m   RA Area:     26.60 cm LA Vol (A2C):   93.4 ml  44.94 ml/m  RA Volume:   96.30 ml  46.34 ml/m LA Vol (A4C):   128.0 ml 61.59 ml/m LA Biplane Vol: 113.0 ml 54.37 ml/m  AORTIC VALVE                    PULMONIC VALVE AV Area (Vmax):    2.83 cm     PV Vmax:        0.96 m/s AV Area (Vmean):   2.84 cm     PV Peak grad:   3.6 mmHg AV Area (VTI):     3.04 cm     RVOT Peak grad: 4 mmHg AV Vmax:           108.00 cm/s AV Vmean:          70.100 cm/s AV VTI:            0.181 m AV Peak Grad:      4.7 mmHg AV Mean Grad:      2.0 mmHg  LVOT Vmax:         97.30 cm/s LVOT Vmean:        63.300 cm/s LVOT VTI:  0.175 m LVOT/AV VTI ratio: 0.97  AORTA Ao Root diam: 3.30 cm MITRAL VALVE                TRICUSPID VALVE MV Area (PHT): 4.52 cm     TR Peak grad:   34.8 mmHg MV Decel Time: 168 msec     TR Vmax:        295.00 cm/s MV E velocity: 134.00 cm/s                             SHUNTS                             Systemic VTI:  0.18 m                             Systemic Diam: 2.00 cm Ida Rogue MD Electronically signed by Ida Rogue MD Signature Date/Time: 10/23/2020/5:30:24 PM    Final    CT Renal Stone Study  Result Date: 10/20/2020 CLINICAL DATA:  Intermittent weakness, feeling as if he would faint. EXAM: CT ABDOMEN AND PELVIS WITHOUT CONTRAST TECHNIQUE: Multidetector CT imaging of the abdomen and pelvis was performed following the standard protocol without IV contrast. COMPARISON:  September 18, 2020 FINDINGS: Lower chest: Moderate severity chronic appearing fibrotic changes are seen involving the bilateral lung bases. Small bilateral pleural effusions are noted. Hepatobiliary: No focal liver abnormality is seen. No gallstones, gallbladder wall thickening, or biliary dilatation. Pancreas: Unremarkable. No pancreatic ductal dilatation or surrounding inflammatory changes. Spleen: A punctate calcified granuloma is seen within an otherwise normal-appearing spleen. Adrenals/Urinary Tract: Adrenal glands are unremarkable. Kidneys are normal in size, without focal lesions. A 2 mm nonobstructing renal stone is seen within the upper pole of the right kidney. Clusters of subcentimeter nonobstructing renal stones are seen within the mid and lower left kidney. A left-sided endo ureteral stent is in place. The proximal portion of the stent is seen within the proximal left ureter, just beyond the left UPJ. The distal end extends into the urinary bladder, continues through the prostate gland in serpentine fashion to the proximal to mid portion  of the urethra (axial CT image 97, CT series 2). The urinary bladder is empty and subsequently limited in evaluation. Stomach/Bowel: Stomach is within normal limits. Appendix appears normal. No evidence of bowel dilatation. Numerous diverticula are seen within the descending and sigmoid colon. Vascular/Lymphatic: Aortic atherosclerosis. No enlarged abdominal or pelvic lymph nodes. Reproductive: The prostate gland is moderately enlarged. A moderate amount of prostate gland calcification is seen. Other: A 4.4 cm x 2.7 cm fat containing right inguinal hernia is seen. No abdominopelvic ascites. Musculoskeletal: Multilevel degenerative changes seen throughout the lumbar spine. IMPRESSION: 1. Left-sided endo ureteral stent which may be malpositioned, as described above. Confirmation of the desired positioning is recommended. 2. Bilateral nonobstructing renal stones. 3. Small bilateral pleural effusions. 4. Colonic diverticulosis. Aortic Atherosclerosis (ICD10-I70.0). Electronically Signed   By: Virgina Norfolk M.D.   On: 10/20/2020 21:54    EKG: A. fib, mild inferolateral ST depressions.  Echo 10/23/20-  1. Left ventricular ejection fraction, by estimation, is 60 to 65%. The  left ventricle has normal function. The left ventricle has no regional  wall motion abnormalities. Left ventricular diastolic parameters are  indeterminate.   2. Right ventricular systolic function is normal. The right ventricular  size is normal.  There is mildly elevated pulmonary artery systolic  pressure.   3. Rhythm is atrial fibrillation   4. Left atrial size was moderately dilated.  ASSESSMENT AND PLAN:  Ramondo Dietze is a 80 year old male with history of CAD (nonobstructive calcified coronary disease), atrial fibrillation on Eliquis, HFpEF, hypertension, TIA in 2016, COPD on 2 L who was admitted to the hospital with shortness of breath.  Cardiology is consulted for evaluation of an elevated troponin.  # Elevated  troponin #History of atrial fibrillation #Respiratory failure The patient is presenting with several days of progressive shortness of breath, on top of a baseline history of COPD requiring 2 L of oxygen.  His BNP is 220 which is not substantially above his baseline.  In regards to his troponin, he has anemia compared to his baseline with a hemoglobin of 8.2; his hemoglobin on 11/03/2020 was 12.9. Ultimately I do not think his troponin elevation is d/t a plaque rupture event and is more likely from demand related to anemia, and volume overload from stopping his home metoprolol and lasix at discharge. He did have chest pain overnight with lateral ST depressions, but will monitor for now given decreasing troponin and low Hgb. - Hold home eliquis while bleeding; no indication for heparin. - Recommend aspirin 81 mg daily  - metoprolol 12.5 mg BID - Maintain on telemetry - Lasix 20 mg IV BID. Repeat BMP daily please. I have ordered for this morning. - Hold on repeat echocardiogram for now  Signed: Andrez Grime MD 11/13/2020, 7:56 AM

## 2020-11-13 NOTE — Consult Note (Signed)
[facility] Inpatient Progress Note  Subjective: Patient seen for GI bleed.Generalized abdomina bloating, self reports melena with last BM 4-5 days ago,feels the urge, but only passing flatus. Consumed a full breakfast this morning and has no  N/V.  Exam today with mild abdominal distention and mild diffuse tenderness.He is NAD.  Mild lightheaded when up to [facility]. Mild DOE on supplemental O2 therapy. No CP. Recent A/P CT results without acute findings. Recent EGD was normal on 11/06/20. No hx of colonoscopy. The patient and his DTR Baker Janus are requesting colonoscopy before discharge when clinically feasible.    Objective: Vital signs in last 24 hours: Temp:  [97.6 F (36.4 C)-98.1 F (36.7 C)] 98.1 F (36.7 C) (11/04 0819) Pulse Rate:  [81-133] 87 (11/04 0819) Resp:  [15-20] 18 (11/04 0819) BP: (100-129)/(61-74) 109/71 (11/04 0819) SpO2:  [95 %-100 %] 96 % (11/04 0819) Weight:  [86.4 kg-86.5 kg] 86.4 kg (11/04 0223) Blood pressure 109/71, pulse 87, temperature 98.1 F (36.7 C), temperature source Oral, resp. rate 18, height 6\' 2"  (1.88 m), weight 86.4 kg, SpO2 96 %.    Intake/Output from previous day: 11/03 0701 - 11/04 0700 In: 360 [P.O.:360] Out: 500 [Urine:500]  Intake/Output this shift: Total I/O In: 240 [P.O.:240] Out: 925 [Urine:925]   Gen: NAD. Appears comfortable.   HEENT: [place]/AT. PERRLA. Normal external ear exam.  Chest: CTA, no wheezes. Resp regular and non-labored on supplemental O2 therapy.   CV: IRR nl S1, S2. No gallops. A fib- controlled VR 87, BP 109/71  Abd: soft, mild tenderness- generalized, mild distended, active bowel sounds x4   Ext: no edema. Pulses 2+  Neuro: Alert and oriented. Judgement appears normal. Non focal.   Lab Results: Results for orders placed or performed during the hospital encounter of 11/11/20 (from the past 24 hour(s))  CBC     Status: Abnormal   Collection Time: 11/12/20  2:43 PM  Result Value Ref Range   WBC  9.4 4.0 - 10.5 K/uL   RBC 2.73 (L) 4.22 - 5.81 MIL/uL   Hemoglobin 8.3 (L) 13.0 - 17.0 g/dL   HCT 26.2 (L) 39.0 - 52.0 %   MCV 96.0 80.0 - 100.0 fL   MCH 30.4 26.0 - 34.0 pg   MCHC 31.7 30.0 - 36.0 g/dL   RDW 15.2 11.5 - 15.5 %   Platelets 131 (L) 150 - 400 K/uL   nRBC 0.0 0.0 - 0.2 %  CBC     Status: Abnormal   Collection Time: 11/12/20  9:29 PM  Result Value Ref Range   WBC 11.1 (H) 4.0 - 10.5 K/uL   RBC 2.71 (L) 4.22 - 5.81 MIL/uL   Hemoglobin 8.0 (L) 13.0 - 17.0 g/dL   HCT 25.5 (L) 39.0 - 52.0 %   MCV 94.1 80.0 - 100.0 fL   MCH 29.5 26.0 - 34.0 pg   MCHC 31.4 30.0 - 36.0 g/dL   RDW 15.7 (H) 11.5 - 15.5 %   Platelets 145 (L) 150 - 400 K/uL   nRBC 0.0 0.0 - 0.2 %  CBC     Status: Abnormal   Collection Time: 11/13/20  5:59 AM  Result Value Ref Range   WBC 9.3 4.0 - 10.5 K/uL   RBC 2.64 (L) 4.22 - 5.81 MIL/uL   Hemoglobin 7.7 (L) 13.0 - 17.0 g/dL   HCT 25.1 (L) 39.0 - 52.0 %   MCV 95.1 80.0 - 100.0 fL   MCH 29.2 26.0 - 34.0 pg   MCHC  30.7 30.0 - 36.0 g/dL   RDW 15.9 (H) 11.5 - 15.5 %   Platelets 158 150 - 400 K/uL   nRBC 0.0 0.0 - 0.2 %     Recent Labs    11/12/20 1443 11/12/20 2129 11/13/20 0559  WBC 9.4 11.1* 9.3  HGB 8.3* 8.0* 7.7*  HCT 26.2* 25.5* 25.1*  PLT 131* 145* 158   BMET Recent Labs    11/11/20 1340 11/12/20 0435  NA 139 138  K 3.3* 4.0  CL 102 103  CO2 28 26  GLUCOSE 107* 213*  BUN 18 19  CREATININE 0.91 0.95  CALCIUM 8.5* 8.4*   LFT Recent Labs    11/11/20 1340  PROT 5.7*  ALBUMIN 2.9*  AST 23  ALT 27  ALKPHOS 85  BILITOT 1.3*   PT/INR Recent Labs    11/11/20 1516  LABPROT 18.1*  INR 1.5*   Hepatitis Panel No results for input(s): HEPBSAG, HCVAB, HEPAIGM, HEPBIGM in the last 72 hours. C-Diff No results for input(s): CDIFFTOX in the last 72 hours. No results for input(s): CDIFFPCR in the last 72 hours.   Studies/Results: US Venous Img Lower Bilateral (DVT)  Result Date: 11/11/2020 CLINICAL DATA:  Shortness of  breath EXAM: Bilateral LOWER EXTREMITY VENOUS DOPPLER ULTRASOUND TECHNIQUE: Gray-scale sonography with compression, as well as color and duplex ultrasound, were performed to evaluate the deep venous system(s) from the level of the common femoral vein through the popliteal and proximal calf veins. COMPARISON:  None. FINDINGS: VENOUS Normal compressibility of the common femoral, superficial femoral, and popliteal veins, as well as the visualized calf veins. Visualized portions of profunda femoral vein and great saphenous vein unremarkable. No filling defects to suggest DVT on grayscale or color Doppler imaging. Doppler waveforms show normal direction of venous flow, normal respiratory plasticity and response to augmentation. Limited views of the contralateral common femoral vein are unremarkable. OTHER None. Limitations: none IMPRESSION: Negative. Electronically Signed   By: Merilyn Baba M.D.   On: 11/11/2020 18:07   DG Chest Portable 1 View  Result Date: 11/11/2020 CLINICAL DATA:  Shortness of breath EXAM: PORTABLE CHEST 1 VIEW COMPARISON:  Previous studies including the examination of 10/29/2020 FINDINGS: Transverse diameter of heart is slightly increased. Thoracic aorta is tortuous and ectatic. There is prominence of interstitial markings in the parahilar regions with interval worsening. There is faint haziness in the lateral aspect of left mid and both lower lung fields. There is a small smooth marginated radiopacity, possibly in the lateral aspect of minor fissure in the right mid lung fields. There is pleural thickening in both apices, more so on the left side. There is no pneumothorax. Lateral CP angles are clear. IMPRESSION: There is increase in interstitial markings in the parahilar regions suggesting interstitial edema or interstitial pneumonitis. Part of this finding is probably underlying scarring. There is diffuse haziness in the lateral aspect of left mid and both lower lung fields which may be due  to pleural thickening or loculated pleural effusions or pneumonia. Follow-up chest radiographs and CT as clinically warranted should be considered. Electronically Signed   By: Elmer Picker M.D.   On: 11/11/2020 13:38      EXAM: Results: 11/03/2020 15:48 CLINICAL DATA:  Nausea and vomiting CT ABDOMEN AND PELVIS WITH CONTRAST FINDINGS:Lower chest: Cardiomegaly. Emphysema. Solid pulmonary nodules of the left lower lobe. Largest measures up to 7 mm and is located on series 2, image 98. Hepatobiliary: No focal liver abnormality is seen. No gallstones, gallbladder wall thickening,  or biliary dilatation. Pancreas: Unremarkable. No pancreatic ductal dilatation or surrounding inflammatory changes. Spleen: Normal in size without focal abnormality. Adrenals/Urinary Tract: Bilateral adrenal glands are unremarkable. No hydronephrosis. Bilateral non obstructing stones, left greater than right. Bladder is unremarkable. Stomach/Bowel: Stomach is within normal limits. Appendix appears normal. Diverticulosis. No evidence of bowel wall thickening, distention, or inflammatory changes. Vascular/Lymphatic: Aortic atherosclerosis. No enlarged abdominal or pelvic lymph nodes. Reproductive: Prostatomegaly, measuring up to 5.8 cm. Other: Small right greater than left fat containing inguinal hernias. Musculoskeletal: Unchanged L4 compression deformity. No aggressive appearing osseous lesions.IMPRESSION: Diverticulosis with no evidence of diverticulitis. No acute findings abdomen or pelvis.Solid pulmonary nodules of the left lower lobe, largest measures up to 7 mm. Non-contrast chest CT at 6-12 months is recommended. If the nodule is stable at time of repeat CT, then future CT at 18-24 months (from today's scan) is considered optional for low-risk patients, but is recommended for high-risk patients. This recommendation follows the consensus statement: Guidelines for Management of Incidental Pulmonary  Nodules Detected on CT Images: From the Fleischner Society 2017; Radiology 2017; 284:228-243.   Aortic Atherosclerosis (ICD10-I70.0) and Emphysema (ICD10-J43.9).  Electronically Signed By: Yetta Glassman M.D.     Scheduled Inpatient Medications:    atorvastatin  40 mg Oral QPC supper   ferrous gluconate  324 mg Oral QPC supper   furosemide  20 mg Intravenous Q12H   ipratropium-albuterol  3 mL Nebulization Q6H   methocarbamol  500 mg Oral TID   metoprolol tartrate  12.5 mg Oral BID   mometasone-formoterol  2 puff Inhalation BID   montelukast  10 mg Oral QHS   pantoprazole (PROTONIX) IV  40 mg Intravenous Q12H   tamsulosin  0.4 mg Oral Daily      PRN Inpatient Medications:  acetaminophen, albuterol, dextromethorphan-guaiFENesin, hydrALAZINE, nitroGLYCERIN, ondansetron (ZOFRAN) IV, phenazopyridine, polyethylene glycol  Miscellaneous: NA  Assessment:  Anemia secondary to GI blood loss. Self reports melena and hemoccult positive. Drifting Hbg from 12.9 to 7.7 without active GI bleeding during this admission. EGD was normal.  Generalized abdomina bloating, last BM 4-5 days ago,feels the urge, but only passing flatus. Consumed a full breakfast this morning and has no  N/V.  Exam today with mild abdominal distention and mild diffuse tenderness. Recent A/P CT results without acute findings.  Afib w/RVR. HR now in the 80's on therapy and Cardiology is following.  Plan:  Continue fluid and electrolyte management Recent EGD was normal on 11/06/20. Abd/pelvis CT without acute findings. No hx of colonoscopy. Pt and family requesting colonoscopy when clinically feasible before discharge.  Eliquis on hold. Will hold lunch and make further recommendations based on luminal evaluation timing. Dr. Alice Reichert is following and will determine timing of colonoscopy   Gershon Mussel, ANP @KM @, 10:46 AM

## 2020-11-13 NOTE — Progress Notes (Signed)
GI PROGRESS NOTE    [name] Browning  CBJ:[id number] DOB: [birth year] DOA: 11/11/2020 PCP: [facility]  Brief Narrative:  This 80 years old male with PMH significant for diastolic CHF, hyperlipidemia, hypertension, DVT and A. fib on Eliquis, COPD on 2 L of supplemental oxygen at baseline, stroke, GERD, anxiety, CAD, carotid artery stenosis, history of syncope,  iron deficiency anemia presented to the ED with complaints of worsening shortness of breath. Patient was recently hospitalized from 10/25-10/28 for dysphagia.  Patient was seen by speech therapy with no signs of aspiration.  Gastroenterology was consulted and Patient underwent EGD with no significant findings.  Patient is found to have elevated troponins, drop in hemoglobin from 9.7-8.2 with stool positive occult blood. Patient is admitted for acute on chronic hypoxic respiratory failure secondary to acute on chronic diastolic CHF and found to have occult positive stool. Elevated troponin secondary to demand ischemia not active coronary event.  GI is planning to schedule colonoscopy given drop in hemoglobin.  Assessment & Plan:   Principal Problem:   Acute on chronic diastolic CHF (congestive heart failure) (HCC) Active Problems:   HTN (hypertension)   COPD, moderate (HCC)   Acute on chronic respiratory failure with hypoxia (HCC)   Atrial fibrillation (HCC)   DVT (deep venous thrombosis) (HCC)   HLD (hyperlipidemia)   Stroke (HCC)   CAD (coronary artery disease)   NSTEMI (non-ST elevated myocardial infarction) (HCC)   Hypokalemia   Thrombocytopenia (HCC)   Iron deficiency anemia   GI bleeding  Acute on chronic hypoxic respiratory failure sec. to acute on chronic diastolic CHF: Patient presented with shortness of breath, BNP 220, JVD+ , Orthopnea, pedal edema consistent with CHF exacerbation. Continue Lasix 20 mg twice daily, Monitor Daily weight, intake output charting, low-salt diet.   Intake/Output Summary (Last 24  hours) at 11/13/2020 1356 Last data filed at 11/13/2020 1045 Gross per 24 hour  Intake 600 ml  Output 1425 ml  Net -825 ml     CAD / NSTEMI: Presented with elevated troponin, patient denies any chest pain. This could be demand ischemia in the setting of acute CHF. Will hold IV heparin given drop in hemoglobin and occult positive stools. Trop  trending down. 665 > 632 > 493> Continue Lipitor and as needed nitroglycerin. Cardiology consulted, thinks  its demand ischemia in the setting of anemia and volume overload from stopping his metoprolol and Lasix at discharge. Hold Eliquis, no indication for heparin. Continue aspirin,  resume metoprolol and Lasix 20 twice daily.  Essential hypertension Continue Lasix, IV hydralazine. Resume metoprolol.  COPD: Stable on exam.  Continue bronchodilators.  Atrial fibrillation: Hold Eliquis due to GI bleed.   Heart rate fluctuating, resume metoprolol 12.5 mg twice daily. Heart rate now reasonably controlled.  Hx. Of DVT: Patient does have history of right leg DVT. Eliquis is on hold for positive GI bleed.  Hyperlipidemia: Continue Lipitor  History of stroke: Resume aspirin , Hold Eliquis.  Thrombocytopenia: Chronic, platelet is stable  Iron deficiency anemia: Patient presented with dark-colored stools and stool for occult positive Hb  dropped from 9.7->8.2> 8.0> 7.7> 8.3 Continue Protonix 40 IV every 12 hours GI consulted, No plan for any invasive intervention until actively bleeding. GI is now considering colonoscopy before discharge.  DVT prophylaxis: SCDs Code Status: Full code. Family Communication: Wife at bed side. Disposition Plan:     Status is: Inpatient  Remains inpatient appropriate because: Acute on chronic hypoxic respiratory failure, acute on chronic diastolic CHF, acute  blood loss anemia.  Consultants:  Cardiology Gastroenterology  Procedures: None Antimicrobials: None  Subjective: Patient was seen and  examined at bedside.  Overnight events noted.   Patient states he is feeling better, denies any  N/V/D. Patient was seen in the morning sitting on the bedside commode trying to have a bowel movement.   Denies any blood in the stool.  Objective: Vitals:   11/13/20 0323 11/13/20 0819 11/13/20 1132 11/13/20 1354  BP: 100/61 109/71 (!) 100/59   Pulse:  87 89   Resp: 20 18 18    Temp: 97.6 F (36.4 C) 98.1 F (36.7 C) 98 F (36.7 C)   TempSrc: Oral Oral    SpO2: 96% 96% 96% 95%  Weight:      Height:        Intake/Output Summary (Last 24 hours) at 11/13/2020 1356 Last data filed at 11/13/2020 1045 Gross per 24 hour  Intake 600 ml  Output 1425 ml  Net -825 ml   Filed Weights   11/11/20 1321 11/12/20 1538 11/13/20 0223  Weight: 86.6 kg 86.5 kg 86.4 kg    Examination:  General exam: Appears comfortable, not in any acute distress.  Deconditioned Respiratory system: Clear to auscultation bilaterally. Respiratory effort normal. RR 14 Cardiovascular system: S1-S2 heard, Irregular rhythm, no murmur. Gastrointestinal system: Abdomen is soft, nontender, nondistended, BS +. Central nervous system: Alert and oriented x 3. No focal neurological deficits. Extremities: pedal edema + , no cyanosis, no clubbing. Skin: No rashes, lesions or ulcers Psychiatry: Judgement and insight appear normal. Mood & affect appropriate.     Data Reviewed: I have personally reviewed following labs and imaging studies  CBC: Recent Labs  Lab 11/12/20 0924 11/12/20 1443 11/12/20 2129 11/13/20 0559 11/13/20 0928  WBC 6.7 9.4 11.1* 9.3 9.4  HGB 8.3* 8.3* 8.0* 7.7* 8.3*  HCT 25.7* 26.2* 25.5* 25.1* 26.1*  MCV 95.5 96.0 94.1 95.1 93.9  PLT 120* 131* 145* 158 623   Basic Metabolic Panel: Recent Labs  Lab 11/11/20 1340 11/11/20 1527 11/12/20 0435 11/13/20 0928  NA 139  --  138 136  K 3.3*  --  4.0 4.1  CL 102  --  103 99  CO2 28  --  26 28  GLUCOSE 107*  --  213* 125*  BUN 18  --  19 27*   CREATININE 0.91  --  0.95 1.11  CALCIUM 8.5*  --  8.4* 8.6*  MG  --  2.0 1.8  --    GFR: Estimated Creatinine Clearance: 62.7 mL/min (by C-G formula based on SCr of 1.11 mg/dL). Liver Function Tests: Recent Labs  Lab 11/11/20 1340  AST 23  ALT 27  ALKPHOS 85  BILITOT 1.3*  PROT 5.7*  ALBUMIN 2.9*   No results for input(s): LIPASE, AMYLASE in the last 168 hours. No results for input(s): AMMONIA in the last 168 hours. Coagulation Profile: Recent Labs  Lab 11/11/20 1516  INR 1.5*   Cardiac Enzymes: No results for input(s): CKTOTAL, CKMB, CKMBINDEX, TROPONINI in the last 168 hours. BNP (last 3 results) No results for input(s): PROBNP in the last 8760 hours. HbA1C: Recent Labs    11/11/20 1538  HGBA1C 6.0*   CBG: No results for input(s): GLUCAP in the last 168 hours. Lipid Profile: Recent Labs    11/12/20 0435  CHOL 141  HDL 38*  LDLCALC 94  TRIG 47  CHOLHDL 3.7   Thyroid Function Tests: No results for input(s): TSH, T4TOTAL, FREET4, T3FREE, THYROIDAB in the  last 72 hours. Anemia Panel: No results for input(s): VITAMINB12, FOLATE, FERRITIN, TIBC, IRON, RETICCTPCT in the last 72 hours. Sepsis Labs: No results for input(s): PROCALCITON, LATICACIDVEN in the last 168 hours.  Recent Results (from the past 240 hour(s))  Blood culture (single)     Status: None   Collection Time: 11/03/20  2:30 PM   Specimen: BLOOD  Result Value Ref Range Status   Specimen Description BLOOD LEFT ANTECUBITAL  Final   Special Requests   Final    BOTTLES DRAWN AEROBIC AND ANAEROBIC Blood Culture results may not be optimal due to an excessive volume of blood received in culture bottles   Culture   Final    NO GROWTH 5 DAYS Performed at [facility], [address], [place], [place] [address]    Report Status 11/08/2020 FINAL  Final  Urine Culture     Status: None   Collection Time: 11/03/20  8:26 PM   Specimen: Urine, Clean Catch  Result Value Ref Range Status    Specimen Description   Final    URINE, CLEAN CATCH Performed at [facility], [address], [place], [place] [address]    Special Requests   Final    NONE Performed at [facility], [address], [place], [place] [address]    Culture   Final    NO GROWTH Performed at [facility], [place] [address] [place], [place] [address]    Report Status 11/04/2020 FINAL  Final  Resp Panel by RT-PCR (Flu A&B, Covid) Nasopharyngeal Swab     Status: None   Collection Time: 11/11/20  1:40 PM   Specimen: Nasopharyngeal Swab; Nasopharyngeal(NP) swabs in vial transport medium  Result Value Ref Range Status   SARS Coronavirus 2 by RT PCR NEGATIVE NEGATIVE Final    Comment: (NOTE) SARS-CoV-2 target nucleic acids are NOT DETECTED.  The SARS-CoV-2 RNA is generally detectable in upper respiratory specimens during the acute phase of infection. The lowest concentration of SARS-CoV-2 viral copies this assay can detect is 138 copies/mL. A negative result does not preclude SARS-Cov-2 infection and should not be used as the sole basis for treatment or other patient management decisions. A negative result may occur with  improper specimen collection/handling, submission of specimen other than nasopharyngeal swab, presence of viral mutation(s) within the areas targeted by this assay, and inadequate number of viral copies(<138 copies/mL). A negative result must be combined with clinical observations, patient history, and epidemiological information. The expected result is Negative.  Fact Sheet for Patients:  EntrepreneurPulse.com.au  Fact Sheet for Healthcare Providers:  IncredibleEmployment.be  This test is no t yet approved or cleared by the [place] FDA and  has been authorized for detection and/or diagnosis of SARS-CoV-2 by FDA under an Emergency Use Authorization (EUA). This EUA will remain  in effect (meaning this test can be used)  for the duration of the COVID-19 declaration under Section 564(b)(1) of the Act, 21 U.S.C.section 360bbb-3(b)(1), unless the authorization is terminated  or revoked sooner.       Influenza A by PCR NEGATIVE NEGATIVE Final   Influenza B by PCR NEGATIVE NEGATIVE Final    Comment: (NOTE) The Xpert Xpress SARS-CoV-2/FLU/RSV plus assay is intended as an aid in the diagnosis of influenza from Nasopharyngeal swab specimens and should not be used as a sole basis for treatment. Nasal washings and aspirates are unacceptable for Xpert Xpress SARS-CoV-2/FLU/RSV testing.  Fact Sheet for Patients: EntrepreneurPulse.com.au  Fact Sheet for Healthcare Providers:  IncredibleEmployment.be  This test is not yet approved or cleared by the [place] and has been authorized for detection and/or diagnosis of SARS-CoV-2 by FDA under an Emergency Use Authorization (EUA). This EUA will remain in effect (meaning this test can be used) for the duration of the COVID-19 declaration under Section 564(b)(1) of the Act, 21 U.S.C. section 360bbb-3(b)(1), unless the authorization is terminated or revoked.  Performed at [facility], [address] [place], [place] [address]     Radiology Studies: US Venous Img Lower Bilateral (DVT)  Result Date: 11/11/2020 CLINICAL DATA:  Shortness of breath EXAM: Bilateral LOWER EXTREMITY VENOUS DOPPLER ULTRASOUND TECHNIQUE: Gray-scale sonography with compression, as well as color and duplex ultrasound, were performed to evaluate the deep venous system(s) from the level of the common femoral vein through the popliteal and proximal calf veins. COMPARISON:  None. FINDINGS: VENOUS Normal compressibility of the common femoral, superficial femoral, and popliteal veins, as well as the visualized calf veins. Visualized portions of profunda femoral vein and great saphenous vein unremarkable. No filling defects to suggest DVT on  grayscale or color Doppler imaging. Doppler waveforms show normal direction of venous flow, normal respiratory plasticity and response to augmentation. Limited views of the contralateral common femoral vein are unremarkable. OTHER None. Limitations: none IMPRESSION: Negative. Electronically Signed   By: Merilyn Baba M.D.   On: 11/11/2020 18:07    Scheduled Meds:  atorvastatin  40 mg Oral QPC supper   ferrous gluconate  324 mg Oral QPC supper   furosemide  20 mg Intravenous Q12H   ipratropium-albuterol  3 mL Nebulization Q6H   methocarbamol  500 mg Oral TID   metoprolol tartrate  12.5 mg Oral BID   mometasone-formoterol  2 puff Inhalation BID   montelukast  10 mg Oral QHS   pantoprazole (PROTONIX) IV  40 mg Intravenous Q12H   tamsulosin  0.4 mg Oral Daily   Continuous Infusions:   LOS: 2 days    Time spent: 25 mins    Shawna Clamp, MD [facility]   If 7PM-7AM, please contact night-coverage

## 2020-11-13 NOTE — Care Management Important Message (Signed)
Important Message  Patient Details  Name: [name] Browning MRN: [id number] Date of Birth: 12-Jul-1940   Medicare Important Message Given:  Yes     Dannette Barbara 11/13/2020, 1:27 PM

## 2020-11-14 ENCOUNTER — Encounter: Payer: Self-pay | Admitting: Anesthesiology

## 2020-11-14 ENCOUNTER — Inpatient Hospital Stay: Payer: Medicare HMO

## 2020-11-14 ENCOUNTER — Encounter
Admission: EM | Disposition: A | Discharge status: Home / Self Care | Payer: Self-pay | Source: Home / Self Care | Attending: Internal Medicine

## 2020-11-14 ENCOUNTER — Encounter: Payer: Self-pay | Admitting: Family Medicine

## 2020-11-14 DIAGNOSIS — I5033 Acute on chronic diastolic (congestive) heart failure: Secondary | ICD-10-CM

## 2020-11-14 DIAGNOSIS — R570 Cardiogenic shock: Secondary | ICD-10-CM

## 2020-11-14 DIAGNOSIS — I2109 ST elevation (STEMI) myocardial infarction involving other coronary artery of anterior wall: Secondary | ICD-10-CM

## 2020-11-14 DIAGNOSIS — R195 Other fecal abnormalities: Secondary | ICD-10-CM

## 2020-11-14 DIAGNOSIS — R578 Other shock: Secondary | ICD-10-CM

## 2020-11-14 DIAGNOSIS — I25110 Atherosclerotic heart disease of native coronary artery with unstable angina pectoris: Secondary | ICD-10-CM | POA: Diagnosis not present

## 2020-11-14 HISTORY — PX: CENTRAL LINE INSERTION: CATH118232

## 2020-11-14 HISTORY — PX: LEFT HEART CATH AND CORONARY ANGIOGRAPHY: CATH118249

## 2020-11-14 HISTORY — PX: CORONARY/GRAFT ACUTE MI REVASCULARIZATION: CATH118305

## 2020-11-14 LAB — CBC WITH DIFFERENTIAL/PLATELET
Abs Immature Granulocytes: 0.13 10*3/uL — ABNORMAL HIGH (ref 0.00–0.07)
Basophils Absolute: 0.1 10*3/uL (ref 0.0–0.1)
Basophils Relative: 0 %
Eosinophils Absolute: 0 10*3/uL (ref 0.0–0.5)
Eosinophils Relative: 0 %
HCT: 30 % — ABNORMAL LOW (ref 39.0–52.0)
Hemoglobin: 9.3 g/dL — ABNORMAL LOW (ref 13.0–17.0)
Immature Granulocytes: 1 %
Lymphocytes Relative: 4 %
Lymphs Abs: 0.8 10*3/uL (ref 0.7–4.0)
MCH: 29.6 pg (ref 26.0–34.0)
MCHC: 31 g/dL (ref 30.0–36.0)
MCV: 95.5 fL (ref 80.0–100.0)
Monocytes Absolute: 0.7 10*3/uL (ref 0.1–1.0)
Monocytes Relative: 4 %
Neutro Abs: 17.7 10*3/uL — ABNORMAL HIGH (ref 1.7–7.7)
Neutrophils Relative %: 91 %
Platelets: 208 10*3/uL (ref 150–400)
RBC: 3.14 MIL/uL — ABNORMAL LOW (ref 4.22–5.81)
RDW: 16 % — ABNORMAL HIGH (ref 11.5–15.5)
WBC: 19.3 10*3/uL — ABNORMAL HIGH (ref 4.0–10.5)
nRBC: 0.1 % (ref 0.0–0.2)

## 2020-11-14 LAB — MAGNESIUM: Magnesium: 1.8 mg/dL (ref 1.7–2.4)

## 2020-11-14 LAB — POCT ACTIVATED CLOTTING TIME
Activated Clotting Time: 196 seconds
Activated Clotting Time: 219 seconds

## 2020-11-14 LAB — GLUCOSE, CAPILLARY: Glucose-Capillary: 100 mg/dL — ABNORMAL HIGH (ref 70–99)

## 2020-11-14 LAB — IRON AND TIBC
Iron: 35 ug/dL — ABNORMAL LOW (ref 45–182)
Saturation Ratios: 17 % — ABNORMAL LOW (ref 17.9–39.5)
TIBC: 203 ug/dL — ABNORMAL LOW (ref 250–450)
UIBC: 168 ug/dL

## 2020-11-14 LAB — CBC
HCT: 31.4 % — ABNORMAL LOW (ref 39.0–52.0)
HCT: 34.1 % — ABNORMAL LOW (ref 39.0–52.0)
Hemoglobin: 9.5 g/dL — ABNORMAL LOW (ref 13.0–17.0)
Hemoglobin: 10.6 g/dL — ABNORMAL LOW (ref 13.0–17.0)
MCH: 29.3 pg (ref 26.0–34.0)
MCH: 28.8 pg (ref 26.0–34.0)
MCHC: 30.3 g/dL (ref 30.0–36.0)
MCHC: 31.1 g/dL (ref 30.0–36.0)
MCV: 96.9 fL (ref 80.0–100.0)
MCV: 92.7 fL (ref 80.0–100.0)
Platelets: 198 10*3/uL (ref 150–400)
Platelets: 206 10*3/uL (ref 150–400)
RBC: 3.24 MIL/uL — ABNORMAL LOW (ref 4.22–5.81)
RBC: 3.68 MIL/uL — ABNORMAL LOW (ref 4.22–5.81)
RDW: 15.9 % — ABNORMAL HIGH (ref 11.5–15.5)
RDW: 16.1 % — ABNORMAL HIGH (ref 11.5–15.5)
WBC: 17.2 10*3/uL — ABNORMAL HIGH (ref 4.0–10.5)
WBC: 23.1 10*3/uL — ABNORMAL HIGH (ref 4.0–10.5)
nRBC: 0.1 % (ref 0.0–0.2)
nRBC: 0 % (ref 0.0–0.2)

## 2020-11-14 LAB — BLOOD GAS, ARTERIAL
Acid-base deficit: 3.1 mmol/L — ABNORMAL HIGH (ref 0.0–2.0)
Bicarbonate: 21.1 mmol/L (ref 20.0–28.0)
FIO2: 0.33
O2 Saturation: 91.4 %
Patient temperature: 37
pCO2 arterial: 34 mmHg (ref 32.0–48.0)
pH, Arterial: 7.4 (ref 7.350–7.450)
pO2, Arterial: 62 mmHg — ABNORMAL LOW (ref 83.0–108.0)

## 2020-11-14 LAB — LACTIC ACID, PLASMA
Lactic Acid, Venous: 7.1 mmol/L (ref 0.5–1.9)
Lactic Acid, Venous: 4.1 mmol/L (ref 0.5–1.9)
Lactic Acid, Venous: 3.2 mmol/L (ref 0.5–1.9)
Lactic Acid, Venous: 2.9 mmol/L (ref 0.5–1.9)

## 2020-11-14 LAB — FOLATE: Folate: 22.3 ng/mL (ref >5.9)

## 2020-11-14 LAB — COMPREHENSIVE METABOLIC PANEL
ALT: 170 U/L — ABNORMAL HIGH (ref 0–44)
AST: 156 U/L — ABNORMAL HIGH (ref 15–41)
Albumin: 2.5 g/dL — ABNORMAL LOW (ref 3.5–5.0)
Alkaline Phosphatase: 90 U/L (ref 38–126)
Anion gap: 12 (ref 5–15)
BUN: 33 mg/dL — ABNORMAL HIGH (ref 8–23)
CO2: 27 mmol/L (ref 22–32)
Calcium: 8.3 mg/dL — ABNORMAL LOW (ref 8.9–10.3)
Chloride: 99 mmol/L (ref 98–111)
Creatinine, Ser: 1.49 mg/dL — ABNORMAL HIGH (ref 0.61–1.24)
GFR, Estimated: 47 mL/min — ABNORMAL LOW (ref >60)
Glucose, Bld: 85 mg/dL (ref 70–99)
Potassium: 4.1 mmol/L (ref 3.5–5.1)
Sodium: 138 mmol/L (ref 135–145)
Total Bilirubin: 1.6 mg/dL — ABNORMAL HIGH (ref 0.3–1.2)
Total Protein: 5.1 g/dL — ABNORMAL LOW (ref 6.5–8.1)

## 2020-11-14 LAB — BASIC METABOLIC PANEL
Anion gap: 15 (ref 5–15)
BUN: 35 mg/dL — ABNORMAL HIGH (ref 8–23)
CO2: 25 mmol/L (ref 22–32)
Calcium: 8.5 mg/dL — ABNORMAL LOW (ref 8.9–10.3)
Chloride: 100 mmol/L (ref 98–111)
Creatinine, Ser: 1.93 mg/dL — ABNORMAL HIGH (ref 0.61–1.24)
GFR, Estimated: 35 mL/min — ABNORMAL LOW (ref >60)
Glucose, Bld: 84 mg/dL (ref 70–99)
Potassium: 4.4 mmol/L (ref 3.5–5.1)
Sodium: 140 mmol/L (ref 135–145)

## 2020-11-14 LAB — VITAMIN B12: Vitamin B-12: 1193 pg/mL — ABNORMAL HIGH (ref 180–914)

## 2020-11-14 LAB — APTT: aPTT: 31 seconds (ref 24–36)

## 2020-11-14 LAB — TROPONIN I (HIGH SENSITIVITY)
Troponin I (High Sensitivity): 1581 ng/L (ref <18)
Troponin I (High Sensitivity): 5132 ng/L (ref <18)
Troponin I (High Sensitivity): 15951 ng/L (ref <18)

## 2020-11-14 LAB — CREATININE, SERUM
Creatinine, Ser: 1.99 mg/dL — ABNORMAL HIGH (ref 0.61–1.24)
GFR, Estimated: 34 mL/min — ABNORMAL LOW (ref >60)

## 2020-11-14 LAB — PHOSPHORUS: Phosphorus: 3.2 mg/dL (ref 2.5–4.6)

## 2020-11-14 LAB — FERRITIN: Ferritin: >7500 ng/mL — ABNORMAL HIGH (ref 24–336)

## 2020-11-14 SURGERY — COLONOSCOPY
Anesthesia: General

## 2020-11-14 SURGERY — CORONARY/GRAFT ACUTE MI REVASCULARIZATION
Anesthesia: Moderate Sedation

## 2020-11-14 MED ORDER — HEPARIN (PORCINE) IN NACL 1000-0.9 UT/500ML-% IV SOLN
INTRAVENOUS | Status: AC
  Filled 2020-11-14: qty 1000

## 2020-11-14 MED ORDER — HEPARIN SODIUM (PORCINE) 1000 UNIT/ML IJ SOLN
INTRAMUSCULAR | Status: AC
  Filled 2020-11-14: qty 1

## 2020-11-14 MED ORDER — VERAPAMIL HCL 2.5 MG/ML IV SOLN
INTRAVENOUS | Status: DC | PRN
  Administered 2020-11-14: 2.5 mg via INTRA_ARTERIAL

## 2020-11-14 MED ORDER — CHLORHEXIDINE GLUCONATE CLOTH 2 % EX PADS
6.0000 | MEDICATED_PAD | Freq: Every day | CUTANEOUS | Status: DC
  Administered 2020-11-14 – 2020-12-02 (×17): 6 via TOPICAL

## 2020-11-14 MED ORDER — SODIUM CHLORIDE 0.9 % IV SOLN
12.5000 mg | Freq: Four times a day (QID) | INTRAVENOUS | Status: DC | PRN
  Administered 2020-11-14: 12.5 mg via INTRAVENOUS
  Filled 2020-11-14 (×3): qty 0.5

## 2020-11-14 MED ORDER — SODIUM CHLORIDE 0.9 % IV SOLN: INTRAVENOUS | Status: AC

## 2020-11-14 MED ORDER — SODIUM CHLORIDE 0.9 % IV SOLN: 250.0000 mL | INTRAVENOUS | Status: DC

## 2020-11-14 MED ORDER — ALBUMIN HUMAN 25 % IV SOLN
25.0000 g | Freq: Once | INTRAVENOUS | Status: AC
  Administered 2020-11-14: 25 g via INTRAVENOUS
  Filled 2020-11-14: qty 100

## 2020-11-14 MED ORDER — DOPAMINE-DEXTROSE 3.2-5 MG/ML-% IV SOLN
0.0000 ug/kg/min | INTRAVENOUS | Status: DC
Start: 2020-11-14 — End: 2020-11-16

## 2020-11-14 MED ORDER — MAGNESIUM SULFATE 2 GM/50ML IV SOLN
2.0000 g | Freq: Once | INTRAVENOUS | Status: AC
  Administered 2020-11-14: 2 g via INTRAVENOUS
  Filled 2020-11-14: qty 50

## 2020-11-14 MED ORDER — SODIUM CHLORIDE 0.9 % IV BOLUS: 250.0000 mL | Freq: Once | INTRAVENOUS | Status: DC

## 2020-11-14 MED ORDER — IOHEXOL 350 MG/ML SOLN
INTRAVENOUS | Status: DC | PRN
  Administered 2020-11-14: 180 mL via INTRACARDIAC

## 2020-11-14 MED ORDER — VERAPAMIL HCL 2.5 MG/ML IV SOLN
INTRAVENOUS | Status: AC
  Filled 2020-11-14: qty 2

## 2020-11-14 MED ORDER — SODIUM CHLORIDE 0.9% FLUSH
10.0000 mL | Freq: Two times a day (BID) | INTRAVENOUS | Status: DC
  Administered 2020-11-14 – 2020-11-22 (×15): 10 mL
  Administered 2020-11-23: 30 mL
  Administered 2020-11-23: 10 mL
  Administered 2020-11-24: 30 mL
  Administered 2020-11-24 – 2020-11-28 (×8): 10 mL
  Administered 2020-11-28: 30 mL
  Administered 2020-11-29 – 2020-12-02 (×7): 10 mL

## 2020-11-14 MED ORDER — HEPARIN (PORCINE) IN NACL 2000-0.9 UNIT/L-% IV SOLN
INTRAVENOUS | Status: DC | PRN
  Administered 2020-11-14: 1000 mL via INTRACARDIAC

## 2020-11-14 MED ORDER — ONDANSETRON HCL 4 MG/2ML IJ SOLN
4.0000 mg | Freq: Four times a day (QID) | INTRAMUSCULAR | Status: DC | PRN
  Administered 2020-11-14 – 2020-11-17 (×6): 4 mg via INTRAVENOUS
  Filled 2020-11-14 (×6): qty 2

## 2020-11-14 MED ORDER — HEPARIN (PORCINE) IN NACL 1000-0.9 UT/500ML-% IV SOLN
INTRAVENOUS | Status: AC
  Filled 2020-11-14: qty 500

## 2020-11-14 MED ORDER — ATROPINE SULFATE 1 MG/10ML IJ SOSY: 1.0000 mg | PREFILLED_SYRINGE | INTRAMUSCULAR | Status: DC | PRN

## 2020-11-14 MED ORDER — HEPARIN (PORCINE) 25000 UT/250ML-% IV SOLN
1200.0000 [IU]/h | INTRAVENOUS | Status: DC
  Administered 2020-11-14 – 2020-11-16 (×3): 1200 [IU]/h via INTRAVENOUS
  Filled 2020-11-14 (×3): qty 250

## 2020-11-14 MED ORDER — HEPARIN SODIUM (PORCINE) 1000 UNIT/ML IJ SOLN
INTRAMUSCULAR | Status: DC | PRN
  Administered 2020-11-14: 3000 [IU] via INTRAVENOUS
  Administered 2020-11-14: 6000 [IU] via INTRAVENOUS
  Administered 2020-11-14 (×2): 3000 [IU] via INTRAVENOUS

## 2020-11-14 MED ORDER — ASPIRIN EC 81 MG PO TBEC
81.0000 mg | DELAYED_RELEASE_TABLET | Freq: Every day | ORAL | Status: DC
  Administered 2020-11-14 – 2020-12-02 (×19): 81 mg via ORAL
  Filled 2020-11-14 (×19): qty 1

## 2020-11-14 MED ORDER — NOREPINEPHRINE 16 MG/250ML-% IV SOLN
0.0000 ug/min | INTRAVENOUS | Status: DC
  Administered 2020-11-14: 6 ug/min via INTRAVENOUS
  Administered 2020-11-16: 9 ug/min via INTRAVENOUS
  Administered 2020-11-17: 8 ug/min via INTRAVENOUS
  Administered 2020-11-17: 2 ug/min via INTRAVENOUS
  Filled 2020-11-14 (×3): qty 250

## 2020-11-14 MED ORDER — NOREPINEPHRINE 4 MG/250ML-% IV SOLN: 0.0000 ug/min | INTRAVENOUS | Status: DC

## 2020-11-14 MED ORDER — SODIUM CHLORIDE 0.9% IV SOLUTION: Freq: Once | INTRAVENOUS | Status: DC

## 2020-11-14 MED ORDER — SODIUM CHLORIDE 0.9 % IV SOLN: 250.0000 mL | INTRAVENOUS | Status: DC | PRN

## 2020-11-14 MED ORDER — NOREPINEPHRINE 4 MG/250ML-% IV SOLN
INTRAVENOUS | Status: AC
  Administered 2020-11-14: 2 ug/min via INTRAVENOUS
  Filled 2020-11-14: qty 250

## 2020-11-14 MED ORDER — SODIUM CHLORIDE 0.9 % IV BOLUS
250.0000 mL | Freq: Once | INTRAVENOUS | Status: AC
  Administered 2020-11-14: 250 mL via INTRAVENOUS

## 2020-11-14 MED ORDER — SODIUM CHLORIDE 0.9% FLUSH
3.0000 mL | Freq: Two times a day (BID) | INTRAVENOUS | Status: DC
  Administered 2020-11-16 – 2020-12-02 (×30): 3 mL via INTRAVENOUS

## 2020-11-14 MED ORDER — NOREPINEPHRINE 16 MG/250ML-% IV SOLN
0.0000 ug/min | INTRAVENOUS | Status: DC
Start: 2020-11-14 — End: 2020-11-14

## 2020-11-14 MED ORDER — SODIUM CHLORIDE 0.9% FLUSH: 3.0000 mL | INTRAVENOUS | Status: DC | PRN

## 2020-11-14 MED ORDER — DOPAMINE-DEXTROSE 3.2-5 MG/ML-% IV SOLN
INTRAVENOUS | Status: AC
  Administered 2020-11-14: 10 ug/kg/min via INTRAVENOUS
  Filled 2020-11-14: qty 250

## 2020-11-14 MED ORDER — SODIUM CHLORIDE 0.9% FLUSH: 10.0000 mL | INTRAVENOUS | Status: DC | PRN

## 2020-11-14 MED ORDER — LIDOCAINE HCL 1 % IJ SOLN
INTRAMUSCULAR | Status: AC
  Filled 2020-11-14: qty 20

## 2020-11-14 MED ORDER — SODIUM CHLORIDE 0.9 % IV BOLUS
500.0000 mL | Freq: Once | INTRAVENOUS | Status: AC
  Administered 2020-11-14: 500 mL via INTRAVENOUS

## 2020-11-14 SURGICAL SUPPLY — 22 items
CATH INFINITI JR4 5F (CATHETERS) ×3 IMPLANT
CATH LAUNCHER 6FR AL.75 (CATHETERS) ×3 IMPLANT
CATH VISTA GUIDE 6FR 3DRC (CATHETERS) ×3 IMPLANT
CATH VISTA GUIDE 6FR JR4 (CATHETERS) ×3 IMPLANT
CATH VISTA GUIDE 6FR MPA1 (CATHETERS) ×3 IMPLANT
CATH VISTA GUIDE 6FR XBLAD3.5 (CATHETERS) ×3 IMPLANT
COVER PROBE U/S 5X48 (MISCELLANEOUS) ×3 IMPLANT
DEVICE RAD TR BAND REGULAR (VASCULAR PRODUCTS) ×3 IMPLANT
DRAPE BRACHIAL (DRAPES) ×3 IMPLANT
GLIDESHEATH SLEND SS 6F .021 (SHEATH) ×3 IMPLANT
GUIDEWIRE INQWIRE 1.5J.035X260 (WIRE) ×2 IMPLANT
INQWIRE 1.5J .035X260CM (WIRE) ×3
KIT CV MULTILUMEN 7FR 20 (SET/KITS/TRAYS/PACK) ×3
KIT CV MULTILUMEN 7FR 20 SUB (SET/KITS/TRAYS/PACK) ×2 IMPLANT
KIT ENCORE 26 ADVANTAGE (KITS) ×3 IMPLANT
KIT SYRINGE INJ CVI SPIKEX1 (MISCELLANEOUS) ×3 IMPLANT
PACK CARDIAC CATH (CUSTOM PROCEDURE TRAY) ×3 IMPLANT
PROTECTION STATION PRESSURIZED (MISCELLANEOUS) ×3
SET ATX SIMPLICITY (MISCELLANEOUS) ×3 IMPLANT
STATION PROTECTION PRESSURIZED (MISCELLANEOUS) ×2 IMPLANT
TUBING CIL FLEX 10 FLL-RA (TUBING) ×3 IMPLANT
WIRE ASAHI PROWATER 180CM (WIRE) ×3 IMPLANT

## 2020-11-14 NOTE — Progress Notes (Signed)
Cross Cover Patient undergoing golytely prep for colonoscopy , patient with probable vagal event with hypotension bradycardia, paleness and decreased sats.  Improving now with 500 cc bolus and bipap . However, given co morbidities will transfer to stepdown for closer monitoring

## 2020-11-14 NOTE — Progress Notes (Addendum)
[name] Darby, MD [address]  [place]  [place], [place] [address]  Main: [phone]  Fax: [phone] Pager: [phone]   Subjective: Patient is moved to ICU overnight secondary to acute STEMI.  Patient was originally posted for colonoscopy today.  He drank GoLytely prep for colonoscopy, around 6:30 AM today, he developed hypotension, bradycardia as well as hypoxia, moved to stepdown, he further deteriorated with worsening shortness of breath on BiPAP, EKG changes and code STEMI was initiated.  Patient underwent cardiac cath, found to have severe single-vessel coronary artery disease with 95% ostial RCA, preserved EF 50 to 55% with no obvious wall motion abnormalities  When I saw the patient in ICU, he was able to hold conversation in short sentences, currently on high flow nasal cannula.  Patient said he was able to drink the bowel prep and he is never going to drink bowel prep again.  He also told me that his bowel movements were dark brown.  He denied seeing fresh blood or dark blood.  He denies any abdominal pain, nausea or vomiting.  His family was bedside.  Patient is currently being treated for cardiogenic shock with dopamine and Levophed   Objective: Vital signs in last 24 hours: Vitals:   11/14/20 1445 11/14/20 1500 11/14/20 1530 11/14/20 1545  BP: (!) 88/75 (!) 84/72 (!) 93/57 96/69  Pulse: 89 88 76 81  Resp: 15 19 (!) 27 (!) 24  Temp:      TempSrc:      SpO2: 95% 94% 93% 93%  Weight:      Height:       Weight change:   Intake/Output Summary (Last 24 hours) at 11/14/2020 1558 Last data filed at 11/14/2020 1038 Gross per 24 hour  Intake 2600 ml  Output 2526 ml  Net 74 ml     Exam: Heart:: Regular rate and rhythm or S1S2 present Lungs: rales bilateral Abdomen: soft, nontender, normal bowel sounds   Lab Results: CBC Latest Ref Rng & Units 11/14/2020 11/14/2020 11/14/2020  WBC 4.0 - 10.5 K/uL 23.1(H) 19.3(H) 17.2(H)  Hemoglobin 13.0 - 17.0 g/dL  10.6(L) 9.3(L) 9.5(L)  Hematocrit 39.0 - 52.0 % 34.1(L) 30.0(L) 31.4(L)  Platelets 150 - 400 K/uL 206 208 198   CMP Latest Ref Rng & Units 11/14/2020 11/14/2020 11/14/2020  Glucose 70 - 99 mg/dL - 84 85  BUN 8 - 23 mg/dL - 35(H) 33(H)  Creatinine 0.61 - 1.24 mg/dL 1.99(H) 1.93(H) 1.49(H)  Sodium 135 - 145 mmol/L - 140 138  Potassium 3.5 - 5.1 mmol/L - 4.4 4.1  Chloride 98 - 111 mmol/L - 100 99  CO2 22 - 32 mmol/L - 25 27  Calcium 8.9 - 10.3 mg/dL - 8.5(L) 8.3(L)  Total Protein 6.5 - 8.1 g/dL - - 5.1(L)  Total Bilirubin 0.3 - 1.2 mg/dL - - 1.6(H)  Alkaline Phos 38 - 126 U/L - - 90  AST 15 - 41 U/L - - 156(H)  ALT 0 - 44 U/L - - 170(H)    Micro Results: Recent Results (from the past 240 hour(s))  Resp Panel by RT-PCR (Flu A&B, Covid) Nasopharyngeal Swab     Status: None   Collection Time: 11/11/20  1:40 PM   Specimen: Nasopharyngeal Swab; Nasopharyngeal(NP) swabs in vial transport medium  Result Value Ref Range Status   SARS Coronavirus 2 by RT PCR NEGATIVE NEGATIVE Final    Comment: (NOTE) SARS-CoV-2 target nucleic acids are NOT DETECTED.  The SARS-CoV-2 RNA is generally detectable  in upper respiratory specimens during the acute phase of infection. The lowest concentration of SARS-CoV-2 viral copies this assay can detect is 138 copies/mL. A negative result does not preclude SARS-Cov-2 infection and should not be used as the sole basis for treatment or other patient management decisions. A negative result may occur with  improper specimen collection/handling, submission of specimen other than nasopharyngeal swab, presence of viral mutation(s) within the areas targeted by this assay, and inadequate number of viral copies(<138 copies/mL). A negative result must be combined with clinical observations, patient history, and epidemiological information. The expected result is Negative.  Fact Sheet for Patients:  EntrepreneurPulse.com.au  Fact Sheet for Healthcare  Providers:  IncredibleEmployment.be  This test is no t yet approved or cleared by the [place] FDA and  has been authorized for detection and/or diagnosis of SARS-CoV-2 by FDA under an Emergency Use Authorization (EUA). This EUA will remain  in effect (meaning this test can be used) for the duration of the COVID-19 declaration under Section 564(b)(1) of the Act, 21 U.S.C.section 360bbb-3(b)(1), unless the authorization is terminated  or revoked sooner.       Influenza A by PCR NEGATIVE NEGATIVE Final   Influenza B by PCR NEGATIVE NEGATIVE Final    Comment: (NOTE) The Xpert Xpress SARS-CoV-2/FLU/RSV plus assay is intended as an aid in the diagnosis of influenza from Nasopharyngeal swab specimens and should not be used as a sole basis for treatment. Nasal washings and aspirates are unacceptable for Xpert Xpress SARS-CoV-2/FLU/RSV testing.  Fact Sheet for Patients: EntrepreneurPulse.com.au  Fact Sheet for Healthcare Providers: IncredibleEmployment.be  This test is not yet approved or cleared by the [place] FDA and has been authorized for detection and/or diagnosis of SARS-CoV-2 by FDA under an Emergency Use Authorization (EUA). This EUA will remain in effect (meaning this test can be used) for the duration of the COVID-19 declaration under Section 564(b)(1) of the Act, 21 U.S.C. section 360bbb-3(b)(1), unless the authorization is terminated or revoked.  Performed at [facility], [address] [place], [place] [address]    Studies/Results: CARDIAC CATHETERIZATION  Result Date: 11/14/2020   Ost RCA lesion is 95% stenosed.   Prox RCA to Dist RCA lesion is 100% stenosed. -Very minimal flow.  After initial angiography, it disappeared to progressed to the ostium.   Left-to-right collaterals via septal perforators fill the PDA system.   Mid LM to Dist LM lesion is 20% stenosed.   Ost LAD to Prox LAD lesion  is 25% stenosed.  Relatively small caliber vessel that wraps the apex.   Diminutive LCx with moderate sized OM1, mild disease.   The left ventricular systolic function is normal. No obvious regional wall motion normalities.   The left ventricular ejection fraction is 55-65% by visual estimate.   LV end diastolic pressure is moderately elevated.  18 mmHg   There is no aortic valve stenosis. SUMMARY Cardiogenic shock-SCAI scale A-B => not favorable for mechanical support due to ongoing anemia, lack of active anginal symptoms and preserved EF with minimally elevated LVEDP. Severe single-vessel CAD with 95% ostial RCA and 1% flush occlusion of the proximal RCA- Unable to engage with guide catheter to wire the vessel. Does not correlate with anterior elevations and no wall motion normalities Minimal diffuse LAD disease with left-to-right collaterals filling the PDA and part of the PL system. Preserved LVEF of 50 to 55% with no obvious wall motion abnormalities. Successful Right IJ Triple-Lumen Catheter placed RECOMMENDATIONS Would not treat with antiplatelet agent  given total occluded vessel and need for DOAC for A. fib.  Hold off on anticoagulation until hemodynamically stable Continue to titrate Levophed and dopamine for blood pressure support Transfuse to keep hemoglobin greater than 10 Consider possibility of RV involvement as potential cause of shock. Glenetta Hew, MD  DG Chest Port 1 View  Result Date: 11/14/2020 CLINICAL DATA:  Central line placement. Hypoxia. Congestive heart failure. EXAM: PORTABLE CHEST 1 VIEW COMPARISON:  Prior today FINDINGS: A new right jugular central venous catheter is seen with tip overlying the proximal right atrium. No evidence of pneumothorax. Mild cardiomegaly stable. Diffuse interstitial infiltrates remains stable. Loculated fluid within the left major fissure is stable, and mild atelectasis or scarring is again seen in the right upper lobe. IMPRESSION: New right jugular central  venous catheter tip overlies the proximal right atrium. No evidence of pneumothorax. Stable cardiomegaly and diffuse interstitial infiltrates/edema. Stable small loculated left pleural effusion, and right upper lobe atelectasis versus scarring. Electronically Signed   By: Marlaine Hind M.D.   On: 11/14/2020 14:11   DG Chest Port 1 View  Result Date: 11/14/2020 CLINICAL DATA:  Hypoxia EXAM: PORTABLE CHEST 1 VIEW COMPARISON:  11/11/2020 chest radiograph. FINDINGS: Stable cardiomediastinal silhouette with mild cardiomegaly. No pneumothorax. Focal opacity overlying the peripheral mid to upper left lung appears more discrete. No right pleural effusion. Similar mild pulmonary edema. Stable small focal lung opacity in the peripheral upper right lung. IMPRESSION: 1. Similar mild congestive heart failure. 2. Focal opacity overlying the peripheral mid to upper left lung appears more discrete. Stable smaller focal peripheral upper right lung opacity. These may represent loculated pleural effusions and/or pulmonary nodules. Suggest further evaluation with noncontrast chest CT. Electronically Signed   By: Ilona Sorrel M.D.   On: 11/14/2020 07:26   Medications: I have reviewed the patient's current medications. Prior to Admission:  Medications Prior to Admission  Medication Sig Dispense Refill Last Dose   acetaminophen (TYLENOL) 500 MG tablet Take 2 tablets (1,000 mg total) by mouth every 6 (six) hours as needed for mild pain.      apixaban (ELIQUIS) 5 MG TABS tablet Take 1 tablet (5 mg total) by mouth 2 (two) times daily. 60 tablet 2    aspirin EC 81 MG tablet Take 81 mg by mouth daily after supper.      atorvastatin (LIPITOR) 40 MG tablet Take 40 mg by mouth daily after supper.      cetirizine (ZYRTEC) 10 MG tablet Take 10 mg by mouth daily.      famotidine (PEPCID) 20 MG tablet One after supper 30 tablet 11    Ferrous Gluconate (IRON) 240 (27 Fe) MG TABS Take 27 mg by mouth daily after supper.       guaiFENesin-dextromethorphan (ROBITUSSIN DM) 100-10 MG/5ML syrup Take 5 mLs by mouth every 4 (four) hours as needed for cough. 118 mL 0    montelukast (SINGULAIR) 10 MG tablet Take 10 mg by mouth at bedtime.      pantoprazole (PROTONIX) 40 MG tablet Take 40 mg by mouth daily after supper.      phenazopyridine (PYRIDIUM) 200 MG tablet Take 1 tablet (200 mg total) by mouth 3 (three) times daily as needed (bladder pain). 10 tablet 0    polyethylene glycol (MIRALAX / GLYCOLAX) 17 g packet Take 17 g by mouth daily as needed for mild constipation or moderate constipation. (Patient taking differently: Take 17 g by mouth daily.) 14 each 0    SYMBICORT 160-4.5 MCG/ACT inhaler Inhale  2 puffs into the lungs 2 (two) times daily as needed (shortness of breath).      tamsulosin (FLOMAX) 0.4 MG CAPS capsule TAKE 1 CAPSULE BY MOUTH EVERY DAY 14 capsule 0    Scheduled:  sodium chloride   Intravenous Once   atorvastatin  40 mg Oral QPC supper   Chlorhexidine Gluconate Cloth  6 each Topical Daily   ferrous gluconate  324 mg Oral QPC supper   furosemide  20 mg Intravenous Q12H   ipratropium-albuterol  3 mL Nebulization Q6H   methocarbamol  500 mg Oral TID   metoprolol tartrate  12.5 mg Oral BID   mometasone-formoterol  2 puff Inhalation BID   montelukast  10 mg Oral QHS   pantoprazole (PROTONIX) IV  40 mg Intravenous Q12H   sodium chloride flush  10-40 mL Intracatheter Q12H   sodium chloride flush  3 mL Intravenous Q12H   tamsulosin  0.4 mg Oral Daily   Continuous:  sodium chloride     sodium chloride     sodium chloride     DOPamine 10 mcg/kg/min (11/14/20 1229)   norepinephrine (LEVOPHED) Adult infusion 3 mcg/min (11/14/20 1531)   sodium chloride     YPP:JKDTOI chloride, acetaminophen, albuterol, atropine, dextromethorphan-guaiFENesin, hydrALAZINE, nitroGLYCERIN, ondansetron (ZOFRAN) IV, phenazopyridine, polyethylene glycol, sodium chloride flush, sodium chloride flush Anti-infectives (From admission,  onward)    None      Scheduled Meds:  sodium chloride   Intravenous Once   atorvastatin  40 mg Oral QPC supper   Chlorhexidine Gluconate Cloth  6 each Topical Daily   ferrous gluconate  324 mg Oral QPC supper   furosemide  20 mg Intravenous Q12H   ipratropium-albuterol  3 mL Nebulization Q6H   methocarbamol  500 mg Oral TID   metoprolol tartrate  12.5 mg Oral BID   mometasone-formoterol  2 puff Inhalation BID   montelukast  10 mg Oral QHS   pantoprazole (PROTONIX) IV  40 mg Intravenous Q12H   sodium chloride flush  10-40 mL Intracatheter Q12H   sodium chloride flush  3 mL Intravenous Q12H   tamsulosin  0.4 mg Oral Daily   Continuous Infusions:  sodium chloride     sodium chloride     sodium chloride     DOPamine 10 mcg/kg/min (11/14/20 1229)   norepinephrine (LEVOPHED) Adult infusion 3 mcg/min (11/14/20 1531)   sodium chloride     PRN Meds:.sodium chloride, acetaminophen, albuterol, atropine, dextromethorphan-guaiFENesin, hydrALAZINE, nitroGLYCERIN, ondansetron (ZOFRAN) IV, phenazopyridine, polyethylene glycol, sodium chloride flush, sodium chloride flush   Assessment: Principal Problem:   Acute on chronic diastolic CHF (congestive heart failure) (HCC) Active Problems:   HTN (hypertension)   COPD, moderate (HCC)   Acute on chronic respiratory failure with hypoxia (HCC)   Atrial fibrillation (HCC)   DVT (deep venous thrombosis) (HCC)   HLD (hyperlipidemia)   Stroke (HCC)   CAD (coronary artery disease)   NSTEMI (non-ST elevated myocardial infarction) (HCC)   Hypokalemia   Thrombocytopenia (HCC)   Iron deficiency anemia   GI bleeding   Acute ST elevation myocardial infarction (STEMI) of anterior wall ([place])   Cardiogenic shock ([place])   Hemorrhagic shock [facility])  [name] Browning is a 80 year old male with history of hypertension, hyperlipidemia, history of A. fib on Eliquis, history of DVT, COPD on 2 L home oxygen, history of CVA, GERD, diastolic heart failure, carotid artery  stenosis, coronary artery disease is admitted on 11/11/2020 secondary to progressively worsening shortness of breath, increased respiratory effort and dark stools.  Patient had significantly elevated troponins on admission which were thought to be secondary to demand ischemia in setting of anemia.  GI was consulted for anemia as well as heme positive stool.  Patient's lowest hemoglobin was 7.7 on 11/4, dropped from 8.2 on 11/2 which is at the time of admission.  His hemoglobin was 9.7 on 10/28.  Patient received blood transfusion and responded appropriately, his hemoglobin is 10.6 today.  Patient had normal BUN/creatinine at the time of admission.  No known history of iron deficiency anemia Patient underwent EGD for dysphagia on 11/06/2020 which was unremarkable  Planned colonoscopy for today has been canceled as patient developed STEMI early this morning, and moved to ICU.  He underwent cardiac catheterization which revealed severe single-vessel coronary artery disease with 95% ostial RCA, this lesion was not amenable for PCI, preserved EF 50 to 55% with no obvious wall motion abnormalities.  Patient is currently being treated for cardiogenic shock  Plan: Normocytic anemia, FOBT positive with no evidence of active GI bleed Patient is no longer experiencing previously reported dark stools after the bowel prep Likely etiology small bowel AVMs or erosions and possibly a very slow GI bleed if any Will defer any endoscopic procedures at this time Given his history of significant coronary artery disease and history of A. fib, okay to start aspirin 81 mg daily as well as therapeutic heparin from GI standpoint Restart diet as tolerated per ICU protocol Patient already received blood transfusions during this admission, therefore iron panel would not be reliable.  His anemia is likely secondary to anemia of chronic disease or occult GI bleed If patient develops any signs or symptoms suggestive of active GI bleed,  we will readdress the role of endoscopic evaluation at that time  Cardiogenic shock secondary to severe coronary artery disease Currently on dopamine and norepinephrine drips Cardiology on board  GI will follow along with you    LOS: 3 days   [name] Browning 11/14/2020, 3:58 PM

## 2020-11-14 NOTE — Consult Note (Addendum)
Cardiology Consultation:   Patient ID: [name] Browning MRN: [id number]; DOB: 09/24/1940  Admit date: 11/11/2020 Date of Consult: 11/14/2020  PCP:  [name] Browning   [facility] Providers -> N/A Cardiologist: Dr. Ubaldo Browning        Patient Profile:   [name] Browning is a 80 y.o. male with a hx of nonobstructive coronary artery disease, A. fib on Eliquis, HFpEF, HTN, TIA in 2016 as well as COPD on 2 L oxygen at home who is being seen 11/14/2020 for the evaluation of [place] MI with BORDERLINE SHOCK At the request of Dr [name] Browning, Dr. Humphrey Browning, & Dr. Clayborn Browning.  History of Present Illness:   [name] Browning was admitted to the hospital with dyspnea and elevated troponin.  This is actually his third hospitalization in the last 2 months he was admitted from 10/11-22/2022 with respiratory due to COPD and pneumonia as well as potentially obstructive left renal stone.  He developed respiratory distress and hypotension requiring BiPAP.  Was treated with antibiotics for Enterococcus faecalis.  He was then readmitted on 10/25 with dysphagia.  Underwent an EGD that was nonrevealing.  He was discharged on the 28th only to be readmitted with melanotic stools and increased dyspnea on 11/4.  He was found to be anemic with hemoglobin of 7.7 and has been transfused with most recent blood counts being stable at roughly 9.5 and 9.3.  Chest x-ray also shows bilateral infiltrates potentially pneumonia versus scar.  He was transferred to the ICU on the morning of 11/14/2020 for worsening CHF exacerbation and found to have converted from atrial fibrillation to sinus bradycardia now with acute anterior ST elevations on EKG.  He is in borderline shock with blood pressures in the mid 90s on 72mcg/kg/hr of dopamine, with initial blood pressures in the 70s.  He is on BiPAP and quite nauseated.  He was complaining of chest pain and nausea..  Code STEMI was called, I came in to discuss the evaluate patient and discussed plan of care  with with Dr. Mortimer Browning and Dr. Humphrey Browning.  Past Medical History:  Diagnosis Date   Anginal pain ([place])    Anxiety    Aortic atherosclerosis ([place])    Atrial fibrillation ([place]) 01/2019   Bilateral carpal tunnel syndrome    CAD (coronary artery disease)    a.) LHC 2004 --> normal coronaries. b.) normal stress test in 2007 and 2011; c.) Lexiscan 05/20/2014 --> LVEF 55-65%; no significant stress induced ischemia/arrythmia. d.) CT chest 03/25/2019 --> coronaries carcified.   Carotid atherosclerosis, bilateral    Carpal tunnel syndrome, bilateral    Chronic anticoagulation    a.) ASA + apixaban   COPD (chronic obstructive pulmonary disease) (HCC)    CVA (cerebral vascular accident) ([place])    Degenerative disc disease, cervical    Diastolic dysfunction    a.) TTE 05/29/2014 --> LVEF 60-65%; G1DD.   DVT (deep venous thrombosis) (HCC)    GERD (gastroesophageal reflux disease)    History of 2019 novel coronavirus disease (COVID-19) 02/08/2019   HLD (hyperlipidemia)    Hypertension    Kidney stones    Osteoarthritis of right shoulder    Pneumonia    Respiratory failure, acute ([place]) 09/20/2020   a.) severe respiratory distress 1 hour after urological surgery. CXR (+) for acute pulmonary edema. Transferred to ICU and placed on NIPPV. Questionable aspiration PNA. (+) A.fib with RVR. Improved with ABX, diuresis, and amiodarone.   Skin cancer of face    a.) RIGHT ear and  RIGHT forehead; excised.   Syncope    TIA (transient ischemic attack) 2016   Valvular regurgitation    a.) TTE 05/26/2014 --> LVEF 60-65%; trivial MR, mild TR; no AR or PR. b.) TTE 04/20/2015 --> LVEF 55-60%; trivial MR and PR; no AR or TR.    Past Surgical History:  Procedure Laterality Date   CARDIAC CATHETERIZATION  2004   CARPAL TUNNEL RELEASE Right 06/11/2013   CYSTOSCOPY W/ RETROGRADES  10/16/2020   Procedure: CYSTOSCOPY WITH RETROGRADE PYELOGRAM;  Surgeon: Billey Co, MD;  Location: [facility] ORS;  Service: Urology;;    CYSTOSCOPY W/ URETERAL STENT PLACEMENT Left 09/20/2020   Procedure: CYSTOSCOPY WITH RETROGRADE PYELOGRAM/URETERAL STENT PLACEMENT;  Surgeon: Janith Lima, MD;  Location: [facility] ORS;  Service: Urology;  Laterality: Left;   CYSTOSCOPY/URETEROSCOPY/HOLMIUM LASER/STENT PLACEMENT Left 10/16/2020   Procedure: CYSTOSCOPY/URETEROSCOPY/HOLMIUM LASER/STENT PLACEMENT;  Surgeon: Billey Co, MD;  Location: [facility] ORS;  Service: Urology;  Laterality: Left;   ESOPHAGOGASTRODUODENOSCOPY N/A 11/06/2020   Procedure: ESOPHAGOGASTRODUODENOSCOPY (EGD);  Surgeon: Lucilla Lame, MD;  Location: [facility] ENDOSCOPY;  Service: Endoscopy;  Laterality: N/A;   SHOULDER ARTHROSCOPY WITH OPEN ROTATOR CUFF REPAIR Right 08/24/2017   Procedure: SHOULDER ARTHROSCOPY WITH OPEN ROTATOR CUFF REPAIR;  Surgeon: Corky Mull, MD;  Location: [facility] ORS;  Service: Orthopedics;  Laterality: Right;   SKIN CANCER EXCISION  12/01/2016   right ear    SKIN CANCER EXCISION     remove from the right side of the face    THROMBECTOMY Right 2004   leg   THYROIDECTOMY  1950   Not sure if total or partial thyroidectomy.     Home Medications:  Prior to Admission medications   Medication Sig Start Date End Date Taking? Authorizing Provider  acetaminophen (TYLENOL) 500 MG tablet Take 2 tablets (1,000 mg total) by mouth every 6 (six) hours as needed for mild pain. 05/07/20   Olean Ree, MD  apixaban (ELIQUIS) 5 MG TABS tablet Take 1 tablet (5 mg total) by mouth 2 (two) times daily. 09/28/20 10/28/20  Pokhrel, Corrie Mckusick, MD  aspirin EC 81 MG tablet Take 81 mg by mouth daily after supper.    [provider]  atorvastatin (LIPITOR) 40 MG tablet Take 40 mg by mouth daily after supper.    [provider]  cetirizine (ZYRTEC) 10 MG tablet Take 10 mg by mouth daily. 09/29/20   [provider]  famotidine (PEPCID) 20 MG tablet One after supper 10/06/20   Tanda Rockers, MD  Ferrous Gluconate (IRON) 240 (27 Fe) MG TABS Take 27 mg by mouth  daily after supper. 09/21/18   [provider]  guaiFENesin-dextromethorphan (ROBITUSSIN DM) 100-10 MG/5ML syrup Take 5 mLs by mouth every 4 (four) hours as needed for cough. 09/28/20   Pokhrel, Corrie Mckusick, MD  montelukast (SINGULAIR) 10 MG tablet Take 10 mg by mouth at bedtime.    [provider]  pantoprazole (PROTONIX) 40 MG tablet Take 40 mg by mouth daily after supper. 12/10/18   [provider]  phenazopyridine (PYRIDIUM) 200 MG tablet Take 1 tablet (200 mg total) by mouth 3 (three) times daily as needed (bladder pain). 09/28/20   Pokhrel, Corrie Mckusick, MD  polyethylene glycol (MIRALAX / GLYCOLAX) 17 g packet Take 17 g by mouth daily as needed for mild constipation or moderate constipation. Patient taking differently: Take 17 g by mouth daily. 09/28/20   Pokhrel, Corrie Mckusick, MD  SYMBICORT 160-4.5 MCG/ACT inhaler Inhale 2 puffs into the lungs 2 (two) times daily as needed (shortness  of breath). 12/07/18   [provider]  tamsulosin (FLOMAX) 0.4 MG CAPS capsule TAKE 1 CAPSULE BY MOUTH EVERY DAY 11/09/20   Billey Co, MD    Inpatient Medications: Scheduled Meds:  [MAR Hold] sodium chloride   Intravenous Once   [MAR Hold] atorvastatin  40 mg Oral QPC supper   [MAR Hold] Chlorhexidine Gluconate Cloth  6 each Topical Daily   [MAR Hold] ferrous gluconate  324 mg Oral QPC supper   [MAR Hold] furosemide  20 mg Intravenous Q12H   [MAR Hold] ipratropium-albuterol  3 mL Nebulization Q6H   [MAR Hold] methocarbamol  500 mg Oral TID   [MAR Hold] metoprolol tartrate  12.5 mg Oral BID   [MAR Hold] mometasone-formoterol  2 puff Inhalation BID   [MAR Hold] montelukast  10 mg Oral QHS   [MAR Hold] pantoprazole (PROTONIX) IV  40 mg Intravenous Q12H   [MAR Hold] tamsulosin  0.4 mg Oral Daily   Continuous Infusions:  sodium chloride     sodium chloride     [MAR Hold] DOPamine 10 mcg/kg/min (11/14/20 1009)   [MAR Hold] norepinephrine (LEVOPHED) Adult infusion 2 mcg/min (11/14/20  1035)   [MAR Hold] sodium chloride     PRN Meds: [MAR Hold] acetaminophen, [MAR Hold] albuterol, [MAR Hold] atropine, [MAR Hold] dextromethorphan-guaiFENesin, [MAR Hold] hydrALAZINE, [MAR Hold] nitroGLYCERIN, [MAR Hold] ondansetron (ZOFRAN) IV, [MAR Hold] phenazopyridine, [MAR Hold] polyethylene glycol  Allergies:    Allergies  Allergen Reactions   Hydromorphone     Hallucination Pt reports he doesn't know about this    Social History:   Social History   Socioeconomic History   Marital status: Legally Separated    Spouse name: Not on file   Number of children: Not on file   Years of education: Not on file   Highest education level: Not on file  Occupational History   Not on file  Tobacco Use   Smoking status: Former    Packs/day: 1.00    Years: 46.00    Pack years: 46.00    Types: Cigarettes    Start date: 01/10/1957    Quit date: 02/11/2003    Years since quitting: 17.7   Smokeless tobacco: Former    Types: Snuff    Quit date: 02/11/2003  Vaping Use   Vaping Use: Never used  Substance and Sexual Activity   Alcohol use: Not Currently    Alcohol/week: 7.0 standard drinks    Types: 7 Cans of beer per week   Drug use: No   Sexual activity: Yes    Partners: Female  Other Topics Concern   Not on file  Social History Narrative   Live with grandaughter, April   Social Determinants of Health   Financial Resource Strain: Not on file  Food Insecurity: Not on file  Transportation Needs: Not on file  Physical Activity: Not on file  Stress: Not on file  Social Connections: Not on file  Intimate Partner Violence: Not on file    Family History:    Family History  Problem Relation Age of Onset   Hypertension Mother    Heart disease Mother    CAD Father    Heart attack Father      ROS:  Please see the history of present illness.  Transferred from ICU with hypotension, nausea, chest pain and dyspnea and GI bleed  All other ROS reviewed and negative.     Physical  Exam/Data:   Vitals:   11/14/20 0945 11/14/20 1000 11/14/20 1015  11/14/20 1038  BP: (!) 74/44 (!) 92/48 (!) 96/36 (!) 91/48  Pulse: (!) 54 (!) 58 (!) 56 (!) 55  Resp: 17 15 20  (!) 25  Temp:    98.9 F (37.2 C)  TempSrc:    Axillary  SpO2: 96% 100% 98%   Weight:      Height:        Intake/Output Summary (Last 24 hours) at 11/14/2020 1052 Last data filed at 11/14/2020 1038 Gross per 24 hour  Intake 2840 ml  Output 3226 ml  Net -386 ml   Last 3 Weights 11/13/2020 11/12/2020 11/11/2020  Weight (lbs) 190 lb 7.6 oz 190 lb 9.6 oz 191 lb  Weight (kg) 86.4 kg 86.456 kg 86.637 kg     Body mass index is 24.46 kg/m.  General: Thin frail elderly gentleman in moderate distress with BiPAP in place.  Notably nauseated HEENT: Edentulous Neck: no difficult to assess for JVD because of mechanicals BiPAP support. Vascular: Reduced to palpable pulses Cardiac: Bradycardic.  RRR.[place] Kitchen  No obvious M/R/G Lungs:  diffuse coarse bilateral breath sounds. Abd: soft, nontender, no hepatomegaly;  Ext: no edema Musculoskeletal:  No deformities, BUE and BLE strength normal and equal Skin: warm and dry    EKG:  The EKG was personally reviewed and demonstrates: Sinus bradycardia, rate 56, appears to have first-degree block.  Significant 3 to 4 mm ST elevations in V1 through V3, less prominent in V4 with ST depressions in 1 and aVL. ->  Anterior STEMI Telemetry:  Telemetry was personally reviewed and demonstrates: Sinus bradycardia with PVCs so this could  Relevant CV Studies: 2D Echo 10/23/2020: EF 60 to 65%.  No R WMA.  Mildly elevated PAP.  Moderate LA dilation.  A. fib. Remainder of the findings noted in history below.  Apparently had a catheterization back in 2004.  Laboratory Data:  High Sensitivity Troponin:   Recent Labs  Lab 11/11/20 1340 11/11/20 1527 11/11/20 1900 11/11/20 2211 11/14/20 0945  TROPONINIHS 620* 665* 632* 493* 1,581*     Chemistry Recent Labs  Lab 11/11/20 1527  11/12/20 0435 11/13/20 0928 11/14/20 0710 11/14/20 0945  NA  --  138 136 138 140  K  --  4.0 4.1 4.1 4.4  CL  --  103 99 99 100  CO2  --  26 28 27 25   GLUCOSE  --  213* 125* 85 84  BUN  --  19 27* 33* 35*  CREATININE  --  0.95 1.11 1.49* 1.93*  CALCIUM  --  8.4* 8.6* 8.3* 8.5*  MG 2.0 1.8  --  1.8  --   GFRNONAA  --  >60 >60 47* 35*  ANIONGAP  --  9 9 12 15     Recent Labs  Lab 11/11/20 1340 11/14/20 0710  PROT 5.7* 5.1*  ALBUMIN 2.9* 2.5*  AST 23 156*  ALT 27 170*  ALKPHOS 85 90  BILITOT 1.3* 1.6*   Lipids  Recent Labs  Lab 11/12/20 0435  CHOL 141  TRIG 47  HDL 38*  LDLCALC 94  CHOLHDL 3.7    Hematology Recent Labs  Lab 11/13/20 0928 11/14/20 0710 11/14/20 0945  WBC 9.4 17.2* 19.3*  RBC 2.78* 3.24* 3.14*  HGB 8.3* 9.5* 9.3*  HCT 26.1* 31.4* 30.0*  MCV 93.9 96.9 95.5  MCH 29.9 29.3 29.6  MCHC 31.8 30.3 31.0  RDW 15.7* 15.9* 16.0*  PLT 163 198 208   Thyroid No results for input(s): TSH, FREET4 in the last 168 hours.  BNP  Recent Labs  Lab 11/11/20 1340  BNP 220.2*    DDimer No results for input(s): DDIMER in the last 168 hours.   Radiology/Studies:  US Venous Img Lower Bilateral (DVT)  Result Date: 11/11/2020 CLINICAL DATA:  Shortness of breath EXAM: Bilateral LOWER EXTREMITY VENOUS DOPPLER ULTRASOUND TECHNIQUE: Gray-scale sonography with compression, as well as color and duplex ultrasound, were performed to evaluate the deep venous system(s) from the level of the common femoral vein through the popliteal and proximal calf veins. COMPARISON:  None. FINDINGS: VENOUS Normal compressibility of the common femoral, superficial femoral, and popliteal veins, as well as the visualized calf veins. Visualized portions of profunda femoral vein and great saphenous vein unremarkable. No filling defects to suggest DVT on grayscale or color Doppler imaging. Doppler waveforms show normal direction of venous flow, normal respiratory plasticity and response to  augmentation. Limited views of the contralateral common femoral vein are unremarkable. OTHER None. Limitations: none IMPRESSION: Negative. Electronically Signed   By: Merilyn Baba M.D.   On: 11/11/2020 18:07   DG Chest Port 1 View  Result Date: 11/14/2020 CLINICAL DATA:  Hypoxia EXAM: PORTABLE CHEST 1 VIEW COMPARISON:  11/11/2020 chest radiograph. FINDINGS: Stable cardiomediastinal silhouette with mild cardiomegaly. No pneumothorax. Focal opacity overlying the peripheral mid to upper left lung appears more discrete. No right pleural effusion. Similar mild pulmonary edema. Stable small focal lung opacity in the peripheral upper right lung. IMPRESSION: 1. Similar mild congestive heart failure. 2. Focal opacity overlying the peripheral mid to upper left lung appears more discrete. Stable smaller focal peripheral upper right lung opacity. These may represent loculated pleural effusions and/or pulmonary nodules. Suggest further evaluation with noncontrast chest CT. Electronically Signed   By: Ilona Sorrel M.D.   On: 11/14/2020 07:26   DG Chest Portable 1 View  Result Date: 11/11/2020 CLINICAL DATA:  Shortness of breath EXAM: PORTABLE CHEST 1 VIEW COMPARISON:  Previous studies including the examination of 10/29/2020 FINDINGS: Transverse diameter of heart is slightly increased. Thoracic aorta is tortuous and ectatic. There is prominence of interstitial markings in the parahilar regions with interval worsening. There is faint haziness in the lateral aspect of left mid and both lower lung fields. There is a small smooth marginated radiopacity, possibly in the lateral aspect of minor fissure in the right mid lung fields. There is pleural thickening in both apices, more so on the left side. There is no pneumothorax. Lateral CP angles are clear. IMPRESSION: There is increase in interstitial markings in the parahilar regions suggesting interstitial edema or interstitial pneumonitis. Part of this finding is probably  underlying scarring. There is diffuse haziness in the lateral aspect of left mid and both lower lung fields which may be due to pleural thickening or loculated pleural effusions or pneumonia. Follow-up chest radiographs and CT as clinically warranted should be considered. Electronically Signed   By: Elmer Picker M.D.   On: 11/11/2020 13:38     Assessment and Plan:   Acute anterior STEMI Borderline cardiogenic shock -> class B Acute GI bleed with symptomatic anemia A. fib-PAF on anticoagulation COPD   Plan is emergent catheterization   Based on the EKG findings, code STEMI was called.  This was not a case of him going directly to the Cath Lab without answering questions.  I discussed the case with Dr. Mortimer Browning from critical care who was in the process of having a conversation with the patient's family.  This is very difficult decision because this gentleman has presented with a GI  bleed, is acutely ill and now has anterior MI.  We discussed most aggressive versus most conservative approach: Most aggressive would be intubation, Impella LAD PCI transferred to [facility] where we would then have to be continuously monitoring his hemoglobin levels while on anticoagulation for both the Impella and the new stent.  Most conservative would be medical management with blood transfusion, hemodynamic and respiratory support.  After some discussion we decided that the best course of action would be to avoid mechanical support which would require additional anticoagulation.  The plan is to prepare for 1 more unit transfusion, emergent catheterization with hopeful PCI of the LAD and then stabilization here at [facility] to avoid transfer, provided that his hemoglobin levels remained stable and blood pressures remained stable.  I did inform the patient's family that there is a high likelihood of him potentially requiring intubation and there is a high likelihood of adverse outcomes with either cardiac arrest, MI, acute  bleed in the process of PCI.   Glenetta Hew, MD   TIMI Risk Score for ST  Elevation MI:   The patient's TIMI risk score is 9, which indicates a 35.9% risk of all cause mortality at 30 days.   New York Heart Association (NYHA) Functional Class NYHA Class III  CHA2DS2-VASc Score = 7   This indicates a 11.2% annual risk of stroke. The patient's score is based upon: CHF History: 1 HTN History: 1 Diabetes History: 0 Stroke History: 2 Vascular Disease History: 1 Age Score: 2 Gender Score: 0        For questions or updates, please contact [facility] Please consult [email or web address] for contact info under    Signed, Glenetta Hew, MD  11/14/2020 10:52 AM

## 2020-11-14 NOTE — Progress Notes (Signed)
Notified daughter, Dierdre Forth of patients change in status and moving to ICU

## 2020-11-14 NOTE — Consult Note (Signed)
ANTICOAGULATION CONSULT NOTE - Initial Consult  Pharmacy Consult for Heparin infusion Indication: chest pain/ACS  Allergies  Allergen Reactions   Hydromorphone     Hallucination Pt reports he doesn't know about this    Patient Measurements: Height: 6\' 2"  (188 cm) Weight: 86.4 kg (190 lb 7.6 oz) IBW/kg (Calculated) : 82.2 Heparin Dosing Weight: 86.4 kg   Vital Signs: Temp: 98.8 F (37.1 C) (11/05 1345) Temp Source: Axillary (11/05 1345) BP: 96/69 (11/05 1545) Pulse Rate: 81 (11/05 1545)  Labs: Recent Labs    11/11/20 2211 11/12/20 0435 11/14/20 0710 11/14/20 0945 11/14/20 1342  HGB  --    < > 9.5* 9.3* 10.6*  HCT  --    < > 31.4* 30.0* 34.1*  PLT  --    < > 198 208 206  APTT  --   --   --  31  --   CREATININE  --    < > 1.49* 1.93* 1.99*  TROPONINIHS 493*  --   --  1,581* 5,132*   < > = values in this interval not displayed.    Estimated Creatinine Clearance: 35 mL/min (A) (by C-G formula based on SCr of 1.99 mg/dL (H)).   Medical History: Past Medical History:  Diagnosis Date   Anginal pain ([place])    Anxiety    Aortic atherosclerosis ([place])    Atrial fibrillation ([place]) 01/2019   Bilateral carpal tunnel syndrome    CAD (coronary artery disease)    a.) LHC 2004 --> normal coronaries. b.) normal stress test in 2007 and 2011; c.) Lexiscan 05/20/2014 --> LVEF 55-65%; no significant stress induced ischemia/arrythmia. d.) CT chest 03/25/2019 --> coronaries carcified.   Carotid atherosclerosis, bilateral    Carpal tunnel syndrome, bilateral    Chronic anticoagulation    a.) ASA + apixaban   COPD (chronic obstructive pulmonary disease) (HCC)    CVA (cerebral vascular accident) ([place])    Degenerative disc disease, cervical    Diastolic dysfunction    a.) TTE 05/29/2014 --> LVEF 60-65%; G1DD.   DVT (deep venous thrombosis) (HCC)    GERD (gastroesophageal reflux disease)    History of 2019 novel coronavirus disease (COVID-19) 02/08/2019   HLD (hyperlipidemia)     Hypertension    Kidney stones    Osteoarthritis of right shoulder    Pneumonia    Respiratory failure, acute ([place]) 09/20/2020   a.) severe respiratory distress 1 hour after urological surgery. CXR (+) for acute pulmonary edema. Transferred to ICU and placed on NIPPV. Questionable aspiration PNA. (+) A.fib with RVR. Improved with ABX, diuresis, and amiodarone.   Skin cancer of face    a.) RIGHT ear and RIGHT forehead; excised.   Syncope    TIA (transient ischemic attack) 2016   Valvular regurgitation    a.) TTE 05/26/2014 --> LVEF 60-65%; trivial MR, mild TR; no AR or PR. b.) TTE 04/20/2015 --> LVEF 55-60%; trivial MR and PR; no AR or TR.    Medications:  Eliquis 5 mg BID -- last dose > 72 hours Heparin 18,000 units total during PCI 11/14/20 between 1100-1200  Assessment: 80 year old male with PMH of Afib/DVT on eliquis PTA initially presented to ED 11/11/20 with SOB and dark stool. Eliquis has been held due to GIB. 11/14/20 patient admitted to ICU for STEMI/cardiogenic shock. S/p emergent PCI. Hbg stabilized. Cleared by GI to initiate systemic anticoagulation. Pharmacy has been consulted for heparin infusion  Goal of Therapy:  Heparin level 0.3-0.7 units/ml aPTT 66-102 seconds Monitor  platelets by anticoagulation protocol: Yes   Plan:  Start heparin infusion at 1200 units/hr w/o bolus due to recent heparin given during PCI Will Check anti-Xa and aPTT level in 8 hours to verify correlation due to PTA DOAC although last dose ~ 72 hours ago.  Continue to monitor H&H and platelets  Dorothe Pea, PharmD, BCPS Clinical Pharmacist   11/14/2020,5:02 PM

## 2020-11-14 NOTE — Progress Notes (Signed)
PROGRESS NOTE  [name] Browning ATF:[id number] DOB: [birth year] DOA: 11/11/2020 PCP: [facility]  HPI/Recap of past 24 hours: **Brief Narrative:  This 80 years old male with PMH significant for diastolic CHF, hyperlipidemia, hypertension, DVT and A. fib on Eliquis, COPD on 2 L of supplemental oxygen at baseline, stroke, GERD, anxiety, CAD, carotid artery stenosis, history of syncope,  iron deficiency anemia presented to the ED with complaints of worsening shortness of breath. Patient was recently hospitalized from 10/25-10/28 for dysphagia.  Patient was seen by speech therapy with no signs of aspiration.  Gastroenterology was consulted and Patient underwent EGD with no significant findings.  Patient is found to have elevated troponins, drop in hemoglobin from 9.7-8.2 with stool positive occult blood. Patient is admitted for acute on chronic hypoxic respiratory failure secondary to acute on chronic diastolic CHF and found to have occult positive stool. Elevated troponin secondary to demand ischemia not active coronary event.  GI is planning to schedule colonoscopy given drop in hemoglobin.  Assessment/Plan: Principal Problem:   Acute on chronic diastolic CHF (congestive heart failure) (HCC) Active Problems:   HTN (hypertension)   COPD, moderate (HCC)   Acute on chronic respiratory failure with hypoxia (HCC)   Atrial fibrillation (HCC)   DVT (deep venous thrombosis) (HCC)   HLD (hyperlipidemia)   Stroke (HCC)   CAD (coronary artery disease)   NSTEMI (non-ST elevated myocardial infarction) (HCC)   Hypokalemia   Thrombocytopenia (HCC)   Iron deficiency anemia   GI bleeding Acute on chronic hypoxic respiratory failure sec. to acute on chronic diastolic CHF: Patient presented with shortness of breath, BNP 220, JVD+ , Orthopnea, pedal edema consistent with CHF exacerbation. Continue Lasix 20 mg twice daily, Monitor Daily weight, intake output charting, low-salt diet.     Intake/Output  Summary (Last 24 hours) at 11/13/2020 1356 Last data filed at 11/13/2020 1045    Gross per 24 hour  Intake 600 ml  Output 1425 ml  Net -825 ml      CAD / NSTEMI: Is converted to STEMI this morning Presented with elevated troponin, patient denies any chest pain. This could be demand ischemia in the setting of acute CHF. Will hold IV heparin given drop in hemoglobin and occult positive stools. Trop  trending down. 665 > 632 > 493> Continue Lipitor and as needed nitroglycerin. Cardiology consulted, thinks  its demand ischemia in the setting of anemia and volume overload from stopping his metoprolol and Lasix at discharge. Hold Eliquis, no indication for heparin. Continue aspirin,  resume metoprolol and Lasix 20 twice daily.   Essential hypertension Continue Lasix, IV hydralazine. Resume metoprolol.   COPD: Stable on exam.  Continue bronchodilators.   Atrial fibrillation: Hold Eliquis due to GI bleed.   Heart rate fluctuating, resume metoprolol 12.5 mg twice daily. Heart rate now reasonably controlled.   Hx. Of DVT: Patient does have history of right leg DVT. Eliquis is on hold for positive GI bleed.   Hyperlipidemia: Continue Lipitor   History of stroke: Resume aspirin , Hold Eliquis.   Thrombocytopenia: Chronic, platelet is stable   Iron deficiency anemia: Patient presented with dark-colored stools and stool for occult positive Hb  dropped from 9.7->8.2> 8.0> 7.7> 8.3 Continue Protonix 40 IV every 12 hours GI consulted, No plan for any invasive intervention until actively bleeding. GI is now considering colonoscopy before discharge.  Code Status: Full code  Severity of Illness: The appropriate patient status for this patient is INPATIENT. Inpatient status is judged to  be reasonable and necessary in order to provide the required intensity of service to ensure the patient's safety. The patient's presenting symptoms, physical exam findings, and initial radiographic and  laboratory data in the context of their chronic comorbidities is felt to place them at high risk for further clinical deterioration. Furthermore, it is not anticipated that the patient will be medically stable for discharge from the hospital within 2 midnights of admission.   * I certify that at the point of admission it is my clinical judgment that the patient will require inpatient hospital care spanning beyond 2 midnights from the point of admission due to high intensity of service, high risk for further deterioration and high frequency of surveillance required.*   Family Communication: Daughter and granddaughter at bedside  Disposition Plan: Permanent Status is: Inpatient   Dispo: The patient is from: Home              Anticipated d/c is to:               Anticipated d/c date is:               Patient currently not medically stable for discharge  Consultants: Critical care Cardiology  Procedures: Cardiac cath  Antimicrobials:     DVT prophylaxis:      Objective: Vitals:   11/14/20 0622 11/14/20 0744 11/14/20 0812 11/14/20 0849  BP: (!) 91/56 (!) 90/57 (!) 75/51   Pulse: 64 61 (!) 55   Resp: (!) 32 (!) 21 (!) 30   Temp:  97.8 F (36.6 C) 98.8 F (37.1 C)   TempSrc:  Axillary Axillary   SpO2: 95% 98% 98% 96%  Weight:      Height:        Intake/Output Summary (Last 24 hours) at 11/14/2020 0856 Last data filed at 11/13/2020 2232 Gross per 24 hour  Intake 2640 ml  Output 3226 ml  Net -586 ml   Filed Weights   11/11/20 1321 11/12/20 1538 11/13/20 0223  Weight: 86.6 kg 86.5 kg 86.4 kg   Body mass index is 24.46 kg/m.  Exam:  General: 80 y.o. year-old male well developed well nourished in no acute distress.  Alert and oriented x3. Cardiovascular: Regular rate and rhythm with no rubs or gallops.  No thyromegaly or JVD noted.   Respiratory: Clear to auscultation with no wheezes or rales. Good inspiratory effort. Abdomen: Soft nontender nondistended with normal  bowel sounds x4 quadrants. Musculoskeletal: No lower extremity edema. 2/4 pulses in all 4 extremities. Skin: No ulcerative lesions noted or rashes, Psychiatry: Mood is appropriate for condition and setting Neurology:    Data Reviewed: CBC: Recent Labs  Lab 11/12/20 1443 11/12/20 2129 11/13/20 0559 11/13/20 0928 11/14/20 0710  WBC 9.4 11.1* 9.3 9.4 17.2*  HGB 8.3* 8.0* 7.7* 8.3* 9.5*  HCT 26.2* 25.5* 25.1* 26.1* 31.4*  MCV 96.0 94.1 95.1 93.9 96.9  PLT 131* 145* 158 163 256   Basic Metabolic Panel: Recent Labs  Lab 11/11/20 1340 11/11/20 1527 11/12/20 0435 11/13/20 0928 11/14/20 0710  NA 139  --  138 136 138  K 3.3*  --  4.0 4.1 4.1  CL 102  --  103 99 99  CO2 28  --  26 28 27   GLUCOSE 107*  --  213* 125* 85  BUN 18  --  19 27* 33*  CREATININE 0.91  --  0.95 1.11 1.49*  CALCIUM 8.5*  --  8.4* 8.6* 8.3*  MG  --  2.0  1.8  --  1.8  PHOS  --   --   --   --  3.2   GFR: Estimated Creatinine Clearance: 46.7 mL/min (A) (by C-G formula based on SCr of 1.49 mg/dL (H)). Liver Function Tests: Recent Labs  Lab 11/11/20 1340 11/14/20 0710  AST 23 156*  ALT 27 170*  ALKPHOS 85 90  BILITOT 1.3* 1.6*  PROT 5.7* 5.1*  ALBUMIN 2.9* 2.5*   No results for input(s): LIPASE, AMYLASE in the last 168 hours. No results for input(s): AMMONIA in the last 168 hours. Coagulation Profile: Recent Labs  Lab 11/11/20 1516  INR 1.5*   Cardiac Enzymes: No results for input(s): CKTOTAL, CKMB, CKMBINDEX, TROPONINI in the last 168 hours. BNP (last 3 results) No results for input(s): PROBNP in the last 8760 hours. HbA1C: Recent Labs    11/11/20 1538  HGBA1C 6.0*   CBG: Recent Labs  Lab 11/14/20 0611  GLUCAP 100*   Lipid Profile: Recent Labs    11/12/20 0435  CHOL 141  HDL 38*  LDLCALC 94  TRIG 47  CHOLHDL 3.7   Thyroid Function Tests: No results for input(s): TSH, T4TOTAL, FREET4, T3FREE, THYROIDAB in the last 72 hours. Anemia Panel: No results for input(s):  VITAMINB12, FOLATE, FERRITIN, TIBC, IRON, RETICCTPCT in the last 72 hours. Urine analysis:    Component Value Date/Time   COLORURINE YELLOW (A) 11/03/2020 2026   APPEARANCEUR HAZY (A) 11/03/2020 2026   APPEARANCEUR Cloudy (A) 10/08/2020 1545   LABSPEC >1.046 (H) 11/03/2020 2026   LABSPEC 1.014 08/10/2013 1825   PHURINE 5.0 11/03/2020 2026   GLUCOSEU NEGATIVE 11/03/2020 2026   GLUCOSEU Negative 08/10/2013 1825   HGBUR LARGE (A) 11/03/2020 2026   BILIRUBINUR NEGATIVE 11/03/2020 2026   BILIRUBINUR Negative 10/08/2020 1545   BILIRUBINUR 1+ 08/10/2013 [place] 11/03/2020 2026   PROTEINUR 30 (A) 11/03/2020 2026   NITRITE NEGATIVE 11/03/2020 2026   LEUKOCYTESUR NEGATIVE 11/03/2020 2026   LEUKOCYTESUR Negative 08/10/2013 1825   Sepsis Labs: @LABRCNTIP (VXYIAXKPVVZSM:2,LMBEMLJQGBE:0)  ) Recent Results (from the past 240 hour(s))  Resp Panel by RT-PCR (Flu A&B, Covid) Nasopharyngeal Swab     Status: None   Collection Time: 11/11/20  1:40 PM   Specimen: Nasopharyngeal Swab; Nasopharyngeal(NP) swabs in vial transport medium  Result Value Ref Range Status   SARS Coronavirus 2 by RT PCR NEGATIVE NEGATIVE Final    Comment: (NOTE) SARS-CoV-2 target nucleic acids are NOT DETECTED.  The SARS-CoV-2 RNA is generally detectable in upper respiratory specimens during the acute phase of infection. The lowest concentration of SARS-CoV-2 viral copies this assay can detect is 138 copies/mL. A negative result does not preclude SARS-Cov-2 infection and should not be used as the sole basis for treatment or other patient management decisions. A negative result may occur with  improper specimen collection/handling, submission of specimen other than nasopharyngeal swab, presence of viral mutation(s) within the areas targeted by this assay, and inadequate number of viral copies(<138 copies/mL). A negative result must be combined with clinical observations, patient history, and  epidemiological information. The expected result is Negative.  Fact Sheet for Patients:  EntrepreneurPulse.com.au  Fact Sheet for Healthcare Providers:  IncredibleEmployment.be  This test is no t yet approved or cleared by the [place] FDA and  has been authorized for detection and/or diagnosis of SARS-CoV-2 by FDA under an Emergency Use Authorization (EUA). This EUA will remain  in effect (meaning this test can be used) for the duration of the COVID-19 declaration under Section  564(b)(1) of the Act, 21 U.S.C.section 360bbb-3(b)(1), unless the authorization is terminated  or revoked sooner.       Influenza A by PCR NEGATIVE NEGATIVE Final   Influenza B by PCR NEGATIVE NEGATIVE Final    Comment: (NOTE) The Xpert Xpress SARS-CoV-2/FLU/RSV plus assay is intended as an aid in the diagnosis of influenza from Nasopharyngeal swab specimens and should not be used as a sole basis for treatment. Nasal washings and aspirates are unacceptable for Xpert Xpress SARS-CoV-2/FLU/RSV testing.  Fact Sheet for Patients: EntrepreneurPulse.com.au  Fact Sheet for Healthcare Providers: IncredibleEmployment.be  This test is not yet approved or cleared by the [place] FDA and has been authorized for detection and/or diagnosis of SARS-CoV-2 by FDA under an Emergency Use Authorization (EUA). This EUA will remain in effect (meaning this test can be used) for the duration of the COVID-19 declaration under Section 564(b)(1) of the Act, 21 U.S.C. section 360bbb-3(b)(1), unless the authorization is terminated or revoked.  Performed at [facility], [address] [place], [place] [address]       Studies: [facility] Chest [facility] 1 View  Result Date: 11/14/2020 CLINICAL DATA:  Hypoxia EXAM: PORTABLE CHEST 1 VIEW COMPARISON:  11/11/2020 chest radiograph. FINDINGS: Stable cardiomediastinal silhouette with mild  cardiomegaly. No pneumothorax. Focal opacity overlying the peripheral mid to upper left lung appears more discrete. No right pleural effusion. Similar mild pulmonary edema. Stable small focal lung opacity in the peripheral upper right lung. IMPRESSION: 1. Similar mild congestive heart failure. 2. Focal opacity overlying the peripheral mid to upper left lung appears more discrete. Stable smaller focal peripheral upper right lung opacity. These may represent loculated pleural effusions and/or pulmonary nodules. Suggest further evaluation with noncontrast chest CT. Electronically Signed   By: Ilona Sorrel M.D.   On: 11/14/2020 07:26    Scheduled Meds:  atorvastatin  40 mg Oral QPC supper   Chlorhexidine Gluconate Cloth  6 each Topical Daily   ferrous gluconate  324 mg Oral QPC supper   furosemide  20 mg Intravenous Q12H   ipratropium-albuterol  3 mL Nebulization Q6H   methocarbamol  500 mg Oral TID   metoprolol tartrate  12.5 mg Oral BID   mometasone-formoterol  2 puff Inhalation BID   montelukast  10 mg Oral QHS   pantoprazole (PROTONIX) IV  40 mg Intravenous Q12H   tamsulosin  0.4 mg Oral Daily    Continuous Infusions:  sodium chloride       LOS: 3 days     Cristal Deer, MD [facility]  To reach me or the doctor on call, go to: [email or web address] Password [facility]  11/14/2020, 8:56 AM

## 2020-11-14 NOTE — Consult Note (Signed)
NAME:  [name] Browning, MRN:  [id number], DOB:  1940/07/28, LOS: 3 ADMISSION DATE:  11/11/2020 CHIEF COMPLAINT:  acute resp distress  BRIEF SYNOPSIS 80 yo WM admitted for SOB, acute sCHF exacerbation Placed on biPAP CODE STEMI CALLED 11/5 ST elevations V2,V3  History of Present Illness:   80 year old male with history of CAD (nonobstructive calcified coronary disease), atrial fibrillation on Eliquis, HFpEF, hypertension, TIA in 2016, COPD on 2 L who was admitted to the hospital with shortness of breath.  Cardiology is consulted for evaluation of an elevated troponin.   Since he was discharged on Friday, and his medications were changed at discharge. His metoprolol XL 25 mg was stopped, though they restarted this because his HR was high at home. His lasix was also stopped. He has been having increased shortness of breath, and increased LE edema. He was having some black colored stools as well.    He is admitted to the hospital 2 times in the last month.   First admission was from 10/20/2020 to 10/31/2020 when he was admitted with respiratory failure due to COPD and pneumonia, as well as an obstructive left renal stone.  He developed hypotension and worsening respiratory distress requiring pressors and BiPAP.    Blood cultures grew positive for Enterococcus faecalis and staph hemolyticus.   He was treated with antibiotics ending 11/03/2020.  The patient was recently admitted to the hospital from 10/25-10/28 with complaints of dysphagia.    He was seen by gastroenterology who underwent an EGD with no significant findings.  Since that time he has had progressive worsening shortness of breath, eventually presented to the emergency department where he was found to have an oxygen saturation of 88% on his home 2 L of oxygen.  His labs are otherwise notable for a troponin of 620, BNP of 220, hemoglobin of 8.2 (9.7 on 11/06/2020).  Chest x-ray shows diffuse bilateral infiltrates which could be consistent  with edema but some overlying scar.  RE-admitted 11/4 for acute SOB and DOE Dx of acutue Schf exacerbation now with acute STEMI  Pertinent  Medical History  COPD [place] Hospital Events: Including procedures, antibiotic start and stop dates in addition to other pertinent events   11/5 acute SOB and DOE on biPAP CODE STEMI      Antimicrobials:   Antibiotics Given (last 72 hours)     None          Objective   Blood pressure (!) 74/46, pulse (!) 56, temperature 98.8 F (37.1 C), temperature source Axillary, resp. rate 17, height 6\' 2"  (1.88 m), weight 86.4 kg, SpO2 98 %.    FiO2 (%):  [45 %-80 %] 45 %   Intake/Output Summary (Last 24 hours) at 11/14/2020 0946 Last data filed at 11/13/2020 2232 Gross per 24 hour  Intake 2640 ml  Output 3226 ml  Net -586 ml   Filed Weights   11/11/20 1321 11/12/20 1538 11/13/20 0223  Weight: 86.6 kg 86.5 kg 86.4 kg      REVIEW OF SYSTEMS  PATIENT IS UNABLE TO PROVIDE COMPLETE REVIEW OF SYSTEMS DUE TO SEVERE CRITICAL ILLNESS +SOB and RESP DISTRESS on BIPAP   PHYSICAL EXAMINATION:  GENERAL:critically ill appearing, +resp distress EYES: Pupils equal, round, reactive to light.  No scleral icterus.  MOUTH: Moist mucosal membrane. On biPAP NECK: Supple.  PULMONARY: +rhonchi,  CARDIOVASCULAR: S1 and S2.  No murmurs  GASTROINTESTINAL: Soft, nontender, -distended. Positive bowel sounds.  MUSCULOSKELETAL: +edema.  NEUROLOGIC: lethargic but arousable  SKIN:intact,warm,dry     Labs/imaging that I havepersonally reviewed  (right click and "Reselect all SmartList Selections" daily)        ASSESSMENT AND PLAN SYNOPSIS  80 yo WM with multiple medical issues and multiple admissions for acute SOB and acute sCHF exacerbation with acute STEMI   Severe ACUTE Hypoxic and Hypercapnic Respiratory Failure Place on biPAP High risk for cardiac arrest and death High risk for intubation FiO2 (%):  [45  %-80 %] 45 %   SEVERE COPD EXACERBATION -continue IV steroids as prescribed -continue NEB THERAPY as prescribed  CARDIAC FAILURE-acute systolic dysfunction -oxygen as needed -Lasix as tolerated -follow up cardiac enzymes as indicated CODE STEMI CALLED   CARDIAC ICU monitoring   ACUTE KIDNEY INJURY/Renal Failure -continue Foley Catheter-assess need -Avoid nephrotoxic agents -Follow urine output, BMP -Ensure adequate renal perfusion, optimize oxygenation -Renal dose medications   Intake/Output Summary (Last 24 hours) at 11/14/2020 0946 Last data filed at 11/13/2020 2232 Gross per 24 hour  Intake 2640 ml  Output 3226 ml  Net -586 ml     NEUROLOGY Acute toxic metabolic encephalopathy arousable Avoid sedatives   CARDIOGENIC SHOCK SOURCE-ISCHEMIC CARDIOMYOPATHY SEVERE BRADYARRTHMIA ON DOPAMINE -use vasopressors to keep MAP>65 as needed -follow ABG and LA  ENDO - ICU hypoglycemic\Hyperglycemia protocol -check FSBS per protocol   GI-h/o GIB GI PROPHYLAXIS as indicated  NUTRITIONAL STATUS DIET-->NPO Constipation protocol as indicated   ELECTROLYTES -follow labs as needed -replace as needed -pharmacy consultation and following   ACUTE ANEMIA- TRANSFUSE AS NEEDED CONSIDER TRANSFUSION  IF HGB<7 DVT PRX with TED/SCD's ONLY     Best practice (right click and "Reselect all SmartList Selections" daily)  Diet: NPO Foley:  Yes, and it is still needed Mobility:  bed rest  Code Status:  FULL Disposition:ICU  Labs   CBC: Recent Labs  Lab 11/12/20 1443 11/12/20 2129 11/13/20 0559 11/13/20 0928 11/14/20 0710  WBC 9.4 11.1* 9.3 9.4 17.2*  HGB 8.3* 8.0* 7.7* 8.3* 9.5*  HCT 26.2* 25.5* 25.1* 26.1* 31.4*  MCV 96.0 94.1 95.1 93.9 96.9  PLT 131* 145* 158 163 850    Basic Metabolic Panel: Recent Labs  Lab 11/11/20 1340 11/11/20 1527 11/12/20 0435 11/13/20 0928 11/14/20 0710  NA 139  --  138 136 138  K 3.3*  --  4.0 4.1 4.1  CL 102  --  103 99  99  CO2 28  --  26 28 27   GLUCOSE 107*  --  213* 125* 85  BUN 18  --  19 27* 33*  CREATININE 0.91  --  0.95 1.11 1.49*  CALCIUM 8.5*  --  8.4* 8.6* 8.3*  MG  --  2.0 1.8  --  1.8  PHOS  --   --   --   --  3.2   GFR: Estimated Creatinine Clearance: 46.7 mL/min (A) (by C-G formula based on SCr of 1.49 mg/dL (H)). Recent Labs  Lab 11/12/20 2129 11/13/20 0559 11/13/20 0928 11/14/20 0710  WBC 11.1* 9.3 9.4 17.2*    Liver Function Tests: Recent Labs  Lab 11/11/20 1340 11/14/20 0710  AST 23 156*  ALT 27 170*  ALKPHOS 85 90  BILITOT 1.3* 1.6*  PROT 5.7* 5.1*  ALBUMIN 2.9* 2.5*   No results for input(s): LIPASE, AMYLASE in the last 168 hours. No results for input(s): AMMONIA in the last 168 hours.  ABG    Component Value Date/Time   PHART 7.39 09/20/2020 1758   PCO2ART 43 09/20/2020 1758   PO2ART  61 (L) 09/20/2020 1758   HCO3 26.0 09/20/2020 1758   O2SAT 90.7 09/20/2020 1758     Coagulation Profile: Recent Labs  Lab 11/11/20 1516  INR 1.5*    Cardiac Enzymes: No results for input(s): CKTOTAL, CKMB, CKMBINDEX, TROPONINI in the last 168 hours.  HbA1C: Hgb A1c MFr Bld  Date/Time Value Ref Range Status  11/11/2020 03:38 PM 6.0 (H) 4.8 - 5.6 % Final    Comment:    (NOTE) Pre diabetes:          5.7%-6.4%  Diabetes:              >6.4%  Glycemic control for   <7.0% adults with diabetes   02/09/2019 06:40 AM 5.5 4.8 - 5.6 % Final    Comment:    (NOTE) Pre diabetes:          5.7%-6.4% Diabetes:              >6.4% Glycemic control for   <7.0% adults with diabetes     CBG: Recent Labs  Lab 11/14/20 0611  GLUCAP 100*     Past Medical History:  He,  has a past medical history of Anginal pain ([place]), Anxiety, Aortic atherosclerosis ([place]), Atrial fibrillation ([place]) (01/2019), Bilateral carpal tunnel syndrome, CAD (coronary artery disease), Carotid atherosclerosis, bilateral, Carpal tunnel syndrome, bilateral, Chronic anticoagulation, COPD (chronic  obstructive pulmonary disease) ([place]), CVA (cerebral vascular accident) ([place]), Degenerative disc disease, cervical, Diastolic dysfunction, DVT (deep venous thrombosis) ([place]), GERD (gastroesophageal reflux disease), History of 2019 novel coronavirus disease (COVID-19) (02/08/2019), HLD (hyperlipidemia), Hypertension, Kidney stones, Osteoarthritis of right shoulder, Pneumonia, Respiratory failure, acute ([place]) (09/20/2020), Skin cancer of face, Syncope, TIA (transient ischemic attack) (2016), and Valvular regurgitation.   Surgical History:   Past Surgical History:  Procedure Laterality Date   CARDIAC CATHETERIZATION  2004   CARPAL TUNNEL RELEASE Right 06/11/2013   CYSTOSCOPY W/ RETROGRADES  10/16/2020   Procedure: CYSTOSCOPY WITH RETROGRADE PYELOGRAM;  Surgeon: Billey Co, MD;  Location: [facility] ORS;  Service: Urology;;   CYSTOSCOPY W/ URETERAL STENT PLACEMENT Left 09/20/2020   Procedure: CYSTOSCOPY WITH RETROGRADE PYELOGRAM/URETERAL STENT PLACEMENT;  Surgeon: Janith Lima, MD;  Location: [facility] ORS;  Service: Urology;  Laterality: Left;   CYSTOSCOPY/URETEROSCOPY/HOLMIUM LASER/STENT PLACEMENT Left 10/16/2020   Procedure: CYSTOSCOPY/URETEROSCOPY/HOLMIUM LASER/STENT PLACEMENT;  Surgeon: Billey Co, MD;  Location: [facility] ORS;  Service: Urology;  Laterality: Left;   ESOPHAGOGASTRODUODENOSCOPY N/A 11/06/2020   Procedure: ESOPHAGOGASTRODUODENOSCOPY (EGD);  Surgeon: Lucilla Lame, MD;  [facility];  Service: Endoscopy;  Laterality: N/A;   SHOULDER ARTHROSCOPY WITH OPEN ROTATOR CUFF REPAIR Right 08/24/2017   Procedure: SHOULDER ARTHROSCOPY WITH OPEN ROTATOR CUFF REPAIR;  Surgeon: Corky Mull, MD;  Location: [facility] ORS;  Service: Orthopedics;  Laterality: Right;   SKIN CANCER EXCISION  12/01/2016   right ear    SKIN CANCER EXCISION     remove from the right side of the face    THROMBECTOMY Right 2004   leg   THYROIDECTOMY  1950   Not sure if total or partial thyroidectomy.     Social  History:   reports that he quit smoking about 17 years ago. His smoking use included cigarettes. He started smoking about 63 years ago. He has a 46.00 pack-year smoking history. He quit smokeless tobacco use about 17 years ago.  His smokeless tobacco use included snuff. He reports that he does not currently use alcohol after a past usage of about 7.0 standard drinks per week. He reports that  he does not use drugs.   Family History:  His family history includes CAD in his father; Heart attack in his father; Heart disease in his mother; Hypertension in his mother.   Allergies Allergies  Allergen Reactions   Hydromorphone     Hallucination Pt reports he doesn't know about this     Home Medications  Prior to Admission medications   Medication Sig Start Date End Date Taking? Authorizing Provider  acetaminophen (TYLENOL) 500 MG tablet Take 2 tablets (1,000 mg total) by mouth every 6 (six) hours as needed for mild pain. 05/07/20   Olean Ree, MD  apixaban (ELIQUIS) 5 MG TABS tablet Take 1 tablet (5 mg total) by mouth 2 (two) times daily. 09/28/20 10/28/20  Pokhrel, Corrie Mckusick, MD  aspirin EC 81 MG tablet Take 81 mg by mouth daily after supper.    [provider]  atorvastatin (LIPITOR) 40 MG tablet Take 40 mg by mouth daily after supper.    [provider]  cetirizine (ZYRTEC) 10 MG tablet Take 10 mg by mouth daily. 09/29/20   [provider]  famotidine (PEPCID) 20 MG tablet One after supper 10/06/20   Tanda Rockers, MD  Ferrous Gluconate (IRON) 240 (27 Fe) MG TABS Take 27 mg by mouth daily after supper. 09/21/18   [provider]  guaiFENesin-dextromethorphan (ROBITUSSIN DM) 100-10 MG/5ML syrup Take 5 mLs by mouth every 4 (four) hours as needed for cough. 09/28/20   Pokhrel, Corrie Mckusick, MD  montelukast (SINGULAIR) 10 MG tablet Take 10 mg by mouth at bedtime.    [provider]  pantoprazole (PROTONIX) 40 MG tablet Take 40 mg by mouth daily after supper.  12/10/18   [provider]  phenazopyridine (PYRIDIUM) 200 MG tablet Take 1 tablet (200 mg total) by mouth 3 (three) times daily as needed (bladder pain). 09/28/20   Pokhrel, Corrie Mckusick, MD  polyethylene glycol (MIRALAX / GLYCOLAX) 17 g packet Take 17 g by mouth daily as needed for mild constipation or moderate constipation. Patient taking differently: Take 17 g by mouth daily. 09/28/20   Pokhrel, Corrie Mckusick, MD  SYMBICORT 160-4.5 MCG/ACT inhaler Inhale 2 puffs into the lungs 2 (two) times daily as needed (shortness of breath). 12/07/18   [provider]  tamsulosin (FLOMAX) 0.4 MG CAPS capsule TAKE 1 CAPSULE BY MOUTH EVERY DAY 11/09/20   Billey Co, MD       DVT/GI PRX  assessed I Assessed the need for Labs I Assessed the need for Foley I Assessed the need for Central Venous Line Family Discussion when available I Assessed the need for Mobilization I made an Assessment of medications to be adjusted accordingly Safety Risk assessment completed  CASE DISCUSSED IN MULTIDISCIPLINARY ROUNDS WITH ICU TEAM     Critical Care Time devoted to patient care services described in this note is 65  minutes.   Critical care was necessary to treat /prevent imminent and life-threatening deterioration.   PATIENT WITH VERY POOR PROGNOSIS I ANTICIPATE PROLONGED ICU LOS  Patient is critically ill. Patient with Multiorgan failure and at high risk for cardiac arrest and death.    Corrin Parker, M.D.  [facility]  Medical Director [place] Director [facility] Cardio-Pulmonary Department

## 2020-11-14 NOTE — Progress Notes (Signed)
Patient called out from room for help.  He was found across his bed with his O2 off, extremely weak and grey in color.  Pulse ox 49%. Patient was placed on nonrebreather, RT called and Provider called, both came to bedside. Pulse ox was slow to come up, bipap was ordered.  HR was from 40-110's, resp in the 30's, became hypotensive.  See new orders. Transfer orders to stepdown placed.

## 2020-11-14 NOTE — Anesthesia Preprocedure Evaluation (Deleted)
Anesthesia Evaluation  Patient identified by MRN, date of birth, ID band Patient awake    Reviewed: Allergy & Precautions, NPO status , Patient's Chart, lab work & pertinent test results  History of Anesthesia Complications Negative for: history of anesthetic complications  Airway Mallampati: I   Neck ROM: Full    Dental  (+) Edentulous Upper, Edentulous Lower   Pulmonary pneumonia, COPD (on 2L continuously), former smoker,     + decreased breath sounds      Cardiovascular hypertension, + CAD  Normal cardiovascular exam+ dysrhythmias (a fib on Eliquis)  Rhythm:Regular Rate:Normal  Hx DVT  ECG 11/03/20:  Atrial fibrillation Right axis deviation Low voltage, precordial leads Nonspecific T abnormalities, lateral leads   Neuro/Psych PSYCHIATRIC DISORDERS Anxiety TIA Neuromuscular disease CVA    GI/Hepatic GERD  ,  Endo/Other  negative endocrine ROS  Renal/GU Renal disease (nephrolithiasis)     Musculoskeletal  (+) Arthritis ,   Abdominal   Peds  Hematology negative hematology ROS (+)   Anesthesia Other Findings Case cx due to worsening clinical status with acute MI and emergent heart cath  Reproductive/Obstetrics                             Anesthesia Physical Anesthesia Plan Anesthesia Quick Evaluation

## 2020-11-14 NOTE — Progress Notes (Signed)
Called to provide support for family of patient. Multiple family members presented bedside. Patient taken to cath lab, additional family members present and at windows on first floor waiting on update.

## 2020-11-14 NOTE — Progress Notes (Signed)
Upon initial assessment, pt noted to have what appeared to be ST elevation.  EKG performed, MD notified.  Pt denies chest pain, endorsed shortness of breath.  After MD examination, Code STEMI activated.  Multiple peripheral IV s initiated, bolus administered and peripheral vasopressors started to address hypotension.  Family informed of change in patient status, MD discussed POC with patient family.  Pt had single episode of nausea/emesis.  Given zofran.  Blood transfusion initiated.  Pt escorted to CCL by RN, RT, and CN.  Blood transfusion to continue.

## 2020-11-14 NOTE — Progress Notes (Signed)
GOALS OF CARE DISCUSSION  The Clinical status was relayed to family in detail. Family at bedside, Daughters and grand daughters Updated and notified of patients medical condition.   Patient with increased WOB and using accessory muscles to breathe Explained to family course of therapy and the modalities  ACTIVE STEMI [place]    Patient with Progressive multiorgan failure with a very high probablity of a very minimal chance of meaningful recovery despite all aggressive and optimal medical therapy.  PATIENT REMAINS FULL CODE HIGH RISK FOR CARDIAC ARREST AND INTUBATION  Family understands the situation.  Family are satisfied with Plan of action and management. All questions answered  Additional CC time 25 mins   Thais Silberstein Patricia Pesa, M.D.  [facility]  Medical Director [place] Director [facility] Cardio-Pulmonary Department

## 2020-11-14 NOTE — Progress Notes (Signed)
SUBJECTIVE: I was called to see the patient because of anteroseptal injury pattern on EKG.  2 EKG done November 3.  Patient had chest pain this morning also and was moved from telemetry to ICU 3.  Currently not having chest pain but feels short of breath and is hypotensive.   Vitals:   11/14/20 0744 11/14/20 0812 11/14/20 0849 11/14/20 0900  BP: (!) 90/57 (!) 75/51  (!) 74/46  Pulse: 61 (!) 55  (!) 56  Resp: (!) 21 (!) 30  17  Temp: 97.8 F (36.6 C) 98.8 F (37.1 C)    TempSrc: Axillary Axillary    SpO2: 98% 98% 96% 98%  Weight:      Height:        Intake/Output Summary (Last 24 hours) at 11/14/2020 1006 Last data filed at 11/13/2020 2232 Gross per 24 hour  Intake 2640 ml  Output 3226 ml  Net -586 ml    LABS: Basic Metabolic Panel: Recent Labs    11/12/20 0435 11/13/20 0928 11/14/20 0710  NA 138 136 138  K 4.0 4.1 4.1  CL 103 99 99  CO2 26 28 27   GLUCOSE 213* 125* 85  BUN 19 27* 33*  CREATININE 0.95 1.11 1.49*  CALCIUM 8.4* 8.6* 8.3*  MG 1.8  --  1.8  PHOS  --   --  3.2   Liver Function Tests: Recent Labs    11/11/20 1340 11/14/20 0710  AST 23 156*  ALT 27 170*  ALKPHOS 85 90  BILITOT 1.3* 1.6*  PROT 5.7* 5.1*  ALBUMIN 2.9* 2.5*   No results for input(s): LIPASE, AMYLASE in the last 72 hours. CBC: Recent Labs    11/13/20 0928 11/14/20 0710  WBC 9.4 17.2*  HGB 8.3* 9.5*  HCT 26.1* 31.4*  MCV 93.9 96.9  PLT 163 198   Cardiac Enzymes: No results for input(s): CKTOTAL, CKMB, CKMBINDEX, TROPONINI in the last 72 hours. BNP: Invalid input(s): POCBNP D-Dimer: No results for input(s): DDIMER in the last 72 hours. Hemoglobin A1C: Recent Labs    11/11/20 1538  HGBA1C 6.0*   Fasting Lipid Panel: Recent Labs    11/12/20 0435  CHOL 141  HDL 38*  LDLCALC 94  TRIG 47  CHOLHDL 3.7   Thyroid Function Tests: No results for input(s): TSH, T4TOTAL, T3FREE, THYROIDAB in the last 72 hours.  Invalid input(s): FREET3 Anemia Panel: No results for  input(s): VITAMINB12, FOLATE, FERRITIN, TIBC, IRON, RETICCTPCT in the last 72 hours.   PHYSICAL EXAM General: Well developed, well nourished, in no acute distress HEENT:  Normocephalic and atramatic Neck:  No JVD.  Lungs: Clear bilaterally to auscultation and percussion. Heart: HRRR . Normal S1 and S2 without gallops or murmurs.  Abdomen: Bowel sounds are positive, abdomen soft and non-tender  Msk:  Back normal, normal gait. Normal strength and tone for age. Extremities: No clubbing, cyanosis or edema.   Neuro: Alert and oriented X 3. Psych:  Good affect, responds appropriately  TELEMETRY: Sinus rhythm with acute injury pattern and anteroseptal wall with follow-up EKG repeated again just few minutes ago shows persistent ST elevation.  Compared to EKG on 11/3 2022 is a significant change as at that time there was just ST depression in inferolateral leads.  ASSESSMENT AND PLAN: Acute MI with injury pattern and anteroseptal leads with hypotension blood pressure 92/48 with 5 mics of dopamine with heart rate 56.  Apparently family wants everything done thus I called Dr. Ellyn Hack who is on-call for STEMI.  Dr. Ellyn Hack  has arrived and he is talking to the family right now.  Patient is having heme positive stool and was supposed to have colonoscopy today and [facility] last night.  He has been seen by GI.  He was on Eliquis for history of A. fib and TIA.  His hemoglobin currently is around 9.  Since he may be having GI bleed consideration for making patient DNR versus doing PCI will be a decision made between family and Dr. Ellyn Hack.  We will continue to observe the patient closely.  Principal Problem:   Acute on chronic diastolic CHF (congestive heart failure) (HCC) Active Problems:   HTN (hypertension)   COPD, moderate (HCC)   Acute on chronic respiratory failure with hypoxia (HCC)   Atrial fibrillation (HCC)   DVT (deep venous thrombosis) (HCC)   HLD (hyperlipidemia)   Stroke (HCC)   CAD  (coronary artery disease)   NSTEMI (non-ST elevated myocardial infarction) (HCC)   Hypokalemia   Thrombocytopenia (HCC)   Iron deficiency anemia   GI bleeding    Kodie Pick A, MD, [facility] 11/14/2020 10:06 AM

## 2020-11-15 ENCOUNTER — Inpatient Hospital Stay
Admit: 2020-11-15 | Discharge: 2020-11-15 | Disposition: A | Discharge status: Home / Self Care | Payer: Medicare HMO | Attending: Cardiology | Admitting: Cardiology

## 2020-11-15 ENCOUNTER — Encounter: Payer: Self-pay | Admitting: Family Medicine

## 2020-11-15 ENCOUNTER — Inpatient Hospital Stay: Payer: Medicare HMO

## 2020-11-15 DIAGNOSIS — I5033 Acute on chronic diastolic (congestive) heart failure: Secondary | ICD-10-CM | POA: Diagnosis not present

## 2020-11-15 LAB — ECHOCARDIOGRAM COMPLETE
AR max vel: 1.65 cm2
AV Peak grad: 5.5 mmHg
Ao pk vel: 1.17 m/s
Area-P 1/2: 3.76 cm2
Height: 74 in
S' Lateral: 2.69 cm
Weight: 3047.64 oz

## 2020-11-15 LAB — TYPE AND SCREEN
ABO/RH(D): O POS
Antibody Screen: NEGATIVE
Unit division: 0
Unit division: 0
Unit division: 0
Unit division: 0

## 2020-11-15 LAB — BPAM RBC
Blood Product Expiration Date: 202212132359
Blood Product Expiration Date: 202212132359
Blood Product Expiration Date: 202212132359
Blood Product Expiration Date: 202212132359
ISSUE DATE / TIME: 202211051034
ISSUE DATE / TIME: 202211051034
Unit Type and Rh: 5100
Unit Type and Rh: 5100
Unit Type and Rh: 5100
Unit Type and Rh: 5100

## 2020-11-15 LAB — CBC
HCT: 33.4 % — ABNORMAL LOW (ref 39.0–52.0)
Hemoglobin: 10.5 g/dL — ABNORMAL LOW (ref 13.0–17.0)
MCH: 28.6 pg (ref 26.0–34.0)
MCHC: 31.4 g/dL (ref 30.0–36.0)
MCV: 91 fL (ref 80.0–100.0)
Platelets: 234 10*3/uL (ref 150–400)
RBC: 3.67 MIL/uL — ABNORMAL LOW (ref 4.22–5.81)
RDW: 16.5 % — ABNORMAL HIGH (ref 11.5–15.5)
WBC: 16.1 10*3/uL — ABNORMAL HIGH (ref 4.0–10.5)
nRBC: 0 % (ref 0.0–0.2)

## 2020-11-15 LAB — RENAL FUNCTION PANEL
Albumin: 3 g/dL — ABNORMAL LOW (ref 3.5–5.0)
Anion gap: 9 (ref 5–15)
BUN: 45 mg/dL — ABNORMAL HIGH (ref 8–23)
CO2: 27 mmol/L (ref 22–32)
Calcium: 7.9 mg/dL — ABNORMAL LOW (ref 8.9–10.3)
Chloride: 102 mmol/L (ref 98–111)
Creatinine, Ser: 2.17 mg/dL — ABNORMAL HIGH (ref 0.61–1.24)
GFR, Estimated: 30 mL/min — ABNORMAL LOW (ref >60)
Glucose, Bld: 145 mg/dL — ABNORMAL HIGH (ref 70–99)
Phosphorus: 5.1 mg/dL — ABNORMAL HIGH (ref 2.5–4.6)
Potassium: 4.3 mmol/L (ref 3.5–5.1)
Sodium: 138 mmol/L (ref 135–145)

## 2020-11-15 LAB — BLOOD GAS, VENOUS
Acid-Base Excess: 3.2 mmol/L — ABNORMAL HIGH (ref 0.0–2.0)
Bicarbonate: 27.9 mmol/L (ref 20.0–28.0)
FIO2: 45
O2 Saturation: 83.9 %
Patient temperature: 37
pCO2, Ven: 42 mmHg — ABNORMAL LOW (ref 44.0–60.0)
pH, Ven: 7.43 (ref 7.250–7.430)
pO2, Ven: 47 mmHg — ABNORMAL HIGH (ref 32.0–45.0)

## 2020-11-15 LAB — HEPARIN LEVEL (UNFRACTIONATED)
Heparin Unfractionated: 0.34 IU/mL (ref 0.30–0.70)
Heparin Unfractionated: 0.35 IU/mL (ref 0.30–0.70)

## 2020-11-15 LAB — APTT: aPTT: 70 seconds — ABNORMAL HIGH (ref 24–36)

## 2020-11-15 LAB — TROPONIN I (HIGH SENSITIVITY)
Troponin I (High Sensitivity): >24000 ng/L (ref <18)
Troponin I (High Sensitivity): >24000 ng/L (ref <18)

## 2020-11-15 LAB — MAGNESIUM: Magnesium: 2.5 mg/dL — ABNORMAL HIGH (ref 1.7–2.4)

## 2020-11-15 LAB — LACTIC ACID, PLASMA: Lactic Acid, Venous: 1.9 mmol/L (ref 0.5–1.9)

## 2020-11-15 MED ORDER — LACTATED RINGERS IV BOLUS
500.0000 mL | Freq: Once | INTRAVENOUS | Status: AC
  Administered 2020-11-15: 500 mL via INTRAVENOUS

## 2020-11-15 NOTE — Progress Notes (Signed)
*  PRELIMINARY RESULTS* Echocardiogram 2D Echocardiogram has been performed.  [name] Browning 11/15/2020, 10:18 AM

## 2020-11-15 NOTE — Progress Notes (Signed)
SUBJECTIVE: Patient denies any chest pain   Vitals:   11/15/20 0800 11/15/20 0821 11/15/20 0900 11/15/20 1000  BP: 117/73  116/71 111/68  Pulse:   90 74  Resp: (!) 26  (!) 21 19  Temp: 98.5 F (36.9 C)     TempSrc: Oral     SpO2:  95% 99% 99%  Weight:      Height:        Intake/Output Summary (Last 24 hours) at 11/15/2020 1118 Last data filed at 11/15/2020 1000 Gross per 24 hour  Intake 1538.16 ml  Output 935 ml  Net 603.16 ml    LABS: Basic Metabolic Panel: Recent Labs    11/14/20 0710 11/14/20 0945 11/14/20 1342 11/15/20 0401  NA 138 140  --  138  K 4.1 4.4  --  4.3  CL 99 100  --  102  CO2 27 25  --  27  GLUCOSE 85 84  --  145*  BUN 33* 35*  --  45*  CREATININE 1.49* 1.93* 1.99* 2.17*  CALCIUM 8.3* 8.5*  --  7.9*  MG 1.8  --   --  2.5*  PHOS 3.2  --   --  5.1*   Liver Function Tests: Recent Labs    11/14/20 0710 11/15/20 0401  AST 156*  --   ALT 170*  --   ALKPHOS 90  --   BILITOT 1.6*  --   PROT 5.1*  --   ALBUMIN 2.5* 3.0*   No results for input(s): LIPASE, AMYLASE in the last 72 hours. CBC: Recent Labs    11/14/20 0945 11/14/20 1342 11/15/20 0401  WBC 19.3* 23.1* 16.1*  NEUTROABS 17.7*  --   --   HGB 9.3* 10.6* 10.5*  HCT 30.0* 34.1* 33.4*  MCV 95.5 92.7 91.0  PLT 208 206 234   Cardiac Enzymes: No results for input(s): CKTOTAL, CKMB, CKMBINDEX, TROPONINI in the last 72 hours. BNP: Invalid input(s): POCBNP D-Dimer: No results for input(s): DDIMER in the last 72 hours. Hemoglobin A1C: No results for input(s): HGBA1C in the last 72 hours. Fasting Lipid Panel: No results for input(s): CHOL, HDL, LDLCALC, TRIG, CHOLHDL, LDLDIRECT in the last 72 hours. Thyroid Function Tests: No results for input(s): TSH, T4TOTAL, T3FREE, THYROIDAB in the last 72 hours.  Invalid input(s): FREET3 Anemia Panel: Recent Labs    11/14/20 1616  VITAMINB12 1,193*  FOLATE 22.3  FERRITIN >7,500*  TIBC 203*  IRON 35*     PHYSICAL EXAM General: Well  developed, well nourished, in no acute distress HEENT:  Normocephalic and atramatic Neck:  No JVD.  Lungs: Clear bilaterally to auscultation and percussion. Heart: HRRR . Normal S1 and S2 without gallops or murmurs.  Abdomen: Bowel sounds are positive, abdomen soft and non-tender  Msk:  Back normal, normal gait. Normal strength and tone for age. Extremities: No clubbing, cyanosis or edema.   Neuro: Alert and oriented X 3. Psych:  Good affect, responds appropriately  TELEMETRY: Sinus rhythm  ASSESSMENT AND PLAN: Status post coronary vasospasm leading to ST elevation in lead V1 to V4 yesterday most likely related to hypotension.  Cardiac catheterization reveals no significant LAD lesion with collaterals from occluded proximal to mid RCA with LAD supplying distal RCA.  Wire across chronically occluded RCA with did not advance thus only cardiac catheterization was done and no PCI was done yesterday.  GI doctor recommended that we could start aspirin and heparin because she did not feel that patient was actively bleeding.  Aspirin  and heparin was started yesterday and hemoglobin is still around 10.  Echocardiogram this morning showed left LVEF 55% with some septal hypokinesis.  Patient denies any chest pain and EKG this morning shows nonspecific ST-T changes in the anterior leads.  Continue heparin and aspirin and advise getting colonoscopy and if there is no active bleeding may consider adding Brilinta.  Principal Problem:   Acute on chronic diastolic CHF (congestive heart failure) (HCC) Active Problems:   HTN (hypertension)   COPD, moderate (HCC)   Acute on chronic respiratory failure with hypoxia (HCC)   Atrial fibrillation (HCC)   DVT (deep venous thrombosis) (HCC)   HLD (hyperlipidemia)   Stroke (HCC)   CAD (coronary artery disease)   NSTEMI (non-ST elevated myocardial infarction) (HCC)   Hypokalemia   Thrombocytopenia (HCC)   Iron deficiency anemia   GI bleeding   Acute ST elevation  myocardial infarction (STEMI) of anterior wall ([place])   Cardiogenic shock ([place])   Hemorrhagic shock (HCC)    Brylea Pita A, MD, [facility] 11/15/2020 11:18 AM

## 2020-11-15 NOTE — Progress Notes (Signed)
GOALS OF CARE DISCUSSION  The Clinical status was relayed to family in detail. Son at bedside  Updated and notified of patients medical condition. Patient with increased WOB and using accessory muscles to breathe Explained to family course of therapy and the modalities  Acute STEMI Acute COPD exacerbation and Renal failure   Patient with Progressive multiorgan failure with a very high probablity of a very minimal chance of meaningful recovery despite all aggressive and optimal medical therapy.  PATIENT REMAINS FULL CODE  Family understands the situation.  Family are satisfied with Plan of action and management. All questions answered  Additional CC time 35 mins   Sequoia Mincey Patricia Pesa, M.D.  [facility]  Medical Director [place] Director [facility] Cardio-Pulmonary Department

## 2020-11-15 NOTE — Progress Notes (Signed)
NAME:  [name] Browning, MRN:  [id number], DOB:  1940/10/19, LOS: 4 ADMISSION DATE:  11/11/2020  BRIEF SYNOPSIS 80 yo WM admitted for SOB, acute sCHF exacerbation Placed on biPAP CODE STEMI CALLED 11/5 ST elevations V2,V3 S/p CATH-NO intervention,+demand ischemia    Significant Hospital Events: Including procedures, antibiotic start and stop dates in addition to other pertinent events   11/5 acute SOB and DOE on biPAP CODE STEMI 11/5 s/p CATH chronic RCA occlusion, demand ischemia causing MI 11/6 remains in cardiogenic shock, severe hypoixa     Interim History / Subjective:  Remains hypoxic on high flow [place] Lethargic but arousable On pressors for cardiogenic shock High risk for intubation High risk for cardiac arrest/death     Objective   Blood pressure 124/80, pulse 66, temperature 98.3 F (36.8 C), resp. rate (!) 21, height 6\' 2"  (1.88 m), weight 86.4 kg, SpO2 100 %. CVP:  [9 mmHg-33 mmHg] 12 mmHg  FiO2 (%):  [35 %-50 %] 45 %   Intake/Output Summary (Last 24 hours) at 11/15/2020 0735 Last data filed at 11/15/2020 0556 Gross per 24 hour  Intake 1898.98 ml  Output 660 ml  Net 1238.98 ml   Filed Weights   11/12/20 1538 11/13/20 0223 11/15/20 0500  Weight: 86.5 kg 86.4 kg 86.4 kg     Review of Systems:  Weakness Malaise Fatigue SOB Other:  All other systems negative  PHYSICAL EXAMINATION:  GENERAL:critically ill appearing, +resp distress EYES: Pupils equal, round, reactive to light.  No scleral icterus.  MOUTH: Moist mucosal membrane. NECK: Supple.  PULMONARY: +rhonchi,  CARDIOVASCULAR: S1 and S2.  No murmurs  GASTROINTESTINAL: Soft, nontender, -distended. Positive bowel sounds.  MUSCULOSKELETAL: + edema.  NEUROLOGIC: lethargic SKIN:intact,warm,dry      Labs/imaging that I havepersonally reviewed  (right click and "Reselect all SmartList Selections" daily)        ASSESSMENT AND PLAN SYNOPSIS 80 yo WM with multiple medical issues and multiple  admissions for acute SOB and acute sCHF exacerbation with acute STEMI    Severe ACUTE Hypoxic and Hypercapnic Respiratory Failure High risk for intubation and cardiac arrest FiO2 (%):  [35 %-50 %] 45 %  SEVERE COPD EXACERBATION -continue IV steroids as prescribed -continue NEB THERAPY as prescribed  CARDIAC FAILURE-ACUTE STEMI DEMAND ISCHEMIA -oxygen as needed -Lasix as tolerated -follow up cardiac enzymes as indicated   CARDIAC ICU monitoring   ACUTE KIDNEY INJURY/Renal Failure -continue Foley Catheter-assess need -Avoid nephrotoxic agents -Follow urine output, BMP -Ensure adequate renal perfusion, optimize oxygenation -Renal dose medications   Intake/Output Summary (Last 24 hours) at 11/15/2020 0735 Last data filed at 11/15/2020 0556 Gross per 24 hour  Intake 1898.98 ml  Output 660 ml  Net 1238.98 ml   BMP Latest Ref Rng & Units 11/15/2020 11/14/2020 11/14/2020  Glucose 70 - 99 mg/dL 145(H) - 84  BUN 8 - 23 mg/dL 45(H) - 35(H)  Creatinine 0.61 - 1.24 mg/dL 2.17(H) 1.99(H) 1.93(H)  BUN/Creat Ratio 10 - 24 - - -  Sodium 135 - 145 mmol/L 138 - 140  Potassium 3.5 - 5.1 mmol/L 4.3 - 4.4  Chloride 98 - 111 mmol/L 102 - 100  CO2 22 - 32 mmol/L 27 - 25  Calcium 8.9 - 10.3 mg/dL 7.9(L) - 8.5(L)      CARDIOGENIC  SHOCK SOURCE-ISCHEMIC CARDIOMYOPATHY -use vasopressors to keep MAP>65 as needed -follow ABG and LA as needed  ENDO - ICU hypoglycemic\Hyperglycemia protocol -check FSBS per protocol   GI GI PROPHYLAXIS as indicated  NUTRITIONAL  STATUS DIET-->TF's as tolerated Constipation protocol as indicated   ELECTROLYTES -follow labs as needed -replace as needed -pharmacy consultation and following     Best practice (right click and "Reselect all SmartList Selections" daily)  Diet:  as tolerated Glucose control:  SSI Yes Central venous access:  Yes, and it is still needed Arterial line:  Yes, and it is still needed Foley:  Yes, and it is still  needed Mobility:  bed rest  Code Status:  FULL CODE Disposition: ICU  Labs   CBC: Recent Labs  Lab 11/13/20 0928 11/14/20 0710 11/14/20 0945 11/14/20 1342 11/15/20 0401  WBC 9.4 17.2* 19.3* 23.1* 16.1*  NEUTROABS  --   --  17.7*  --   --   HGB 8.3* 9.5* 9.3* 10.6* 10.5*  HCT 26.1* 31.4* 30.0* 34.1* 33.4*  MCV 93.9 96.9 95.5 92.7 91.0  PLT 163 198 208 206 258    Basic Metabolic Panel: Recent Labs  Lab 11/11/20 1527 11/12/20 0435 11/13/20 0928 11/14/20 0710 11/14/20 0945 11/14/20 1342 11/15/20 0401  NA  --  138 136 138 140  --  138  K  --  4.0 4.1 4.1 4.4  --  4.3  CL  --  103 99 99 100  --  102  CO2  --  26 28 27 25   --  27  GLUCOSE  --  213* 125* 85 84  --  145*  BUN  --  19 27* 33* 35*  --  45*  CREATININE  --  0.95 1.11 1.49* 1.93* 1.99* 2.17*  CALCIUM  --  8.4* 8.6* 8.3* 8.5*  --  7.9*  MG 2.0 1.8  --  1.8  --   --  2.5*  PHOS  --   --   --  3.2  --   --  5.1*   GFR: Estimated Creatinine Clearance: 32.1 mL/min (A) (by C-G formula based on SCr of 2.17 mg/dL (H)). Recent Labs  Lab 11/14/20 0710 11/14/20 0945 11/14/20 0950 11/14/20 1342 11/14/20 1616 11/14/20 1837 11/15/20 0401  WBC 17.2* 19.3*  --  23.1*  --   --  16.1*  LATICACIDVEN  --   --    < > 4.1* 3.2* 2.9* 1.9   < > = values in this interval not displayed.    Liver Function Tests: Recent Labs  Lab 11/11/20 1340 11/14/20 0710 11/15/20 0401  AST 23 156*  --   ALT 27 170*  --   ALKPHOS 85 90  --   BILITOT 1.3* 1.6*  --   PROT 5.7* 5.1*  --   ALBUMIN 2.9* 2.5* 3.0*   No results for input(s): LIPASE, AMYLASE in the last 168 hours. No results for input(s): AMMONIA in the last 168 hours.  ABG    Component Value Date/Time   PHART 7.40 11/14/2020 2038   PCO2ART 34 11/14/2020 2038   PO2ART 62 (L) 11/14/2020 2038   HCO3 21.1 11/14/2020 2038   ACIDBASEDEF 3.1 (H) 11/14/2020 2038   O2SAT 91.4 11/14/2020 2038     Coagulation Profile: Recent Labs  Lab 11/11/20 1516  INR 1.5*     Cardiac Enzymes: No results for input(s): CKTOTAL, CKMB, CKMBINDEX, TROPONINI in the last 168 hours.  HbA1C: Hgb A1c MFr Bld  Date/Time Value Ref Range Status  11/11/2020 03:38 PM 6.0 (H) 4.8 - 5.6 % Final    Comment:    (NOTE) Pre diabetes:          5.7%-6.4%  Diabetes:              >  6.4%  Glycemic control for   <7.0% adults with diabetes   02/09/2019 06:40 AM 5.5 4.8 - 5.6 % Final    Comment:    (NOTE) Pre diabetes:          5.7%-6.4% Diabetes:              >6.4% Glycemic control for   <7.0% adults with diabetes     CBG: Recent Labs  Lab 11/14/20 0611  GLUCAP 100*    Allergies Allergies  Allergen Reactions   Hydromorphone     Hallucination Pt reports he doesn't know about this       DVT/GI PRX  assessed I Assessed the need for Labs I Assessed the need for Foley I Assessed the need for Central Venous Line Family Discussion when available I Assessed the need for Mobilization I made an Assessment of medications to be adjusted accordingly Safety Risk assessment completed  CASE DISCUSSED IN MULTIDISCIPLINARY ROUNDS WITH ICU TEAM     Critical Care Time devoted to patient care services described in this note is 55 minutes.  Critical care was necessary to treat or prevent imminent or life-threatening deterioration.   PATIENT WITH VERY POOR PROGNOSIS I ANTICIPATE PROLONGED ICU LOS  Patient with Multiorgan failure and at high risk for cardiac arrest and death.    Corrin Parker, M.D.  [facility]  Medical Director [place] Director [facility] Cardio-Pulmonary Department

## 2020-11-15 NOTE — Consult Note (Signed)
ANTICOAGULATION CONSULT NOTE - Initial Consult  Pharmacy Consult for Heparin infusion Indication: chest pain/ACS  Allergies  Allergen Reactions   Hydromorphone     Hallucination Pt reports he doesn't know about this    Patient Measurements: Height: 6\' 2"  (188 cm) Weight: 86.4 kg (190 lb 7.6 oz) IBW/kg (Calculated) : 82.2 Heparin Dosing Weight: 86.4 kg   Vital Signs: Temp: 98.6 F (37 C) (11/06 0000) BP: 101/62 (11/06 0300) Pulse Rate: 66 (11/06 0100)  Labs: Recent Labs    11/14/20 0710 11/14/20 0710 11/14/20 0945 11/14/20 1342 11/14/20 1837 11/15/20 0016 11/15/20 0127  HGB 9.5*  --  9.3* 10.6*  --   --   --   HCT 31.4*  --  30.0* 34.1*  --   --   --   PLT 198  --  208 206  --   --   --   APTT  --   --  31  --   --   --  70*  HEPARINUNFRC  --   --   --   --   --   --  0.34  CREATININE 1.49*  --  1.93* 1.99*  --   --   --   TROPONINIHS  --    < > 1,581* 5,132* 15,951* >24,000*  --    < > = values in this interval not displayed.     Estimated Creatinine Clearance: 35 mL/min (A) (by C-G formula based on SCr of 1.99 mg/dL (H)).   Medical History: Past Medical History:  Diagnosis Date   Anginal pain ([place])    Anxiety    Aortic atherosclerosis ([place])    Atrial fibrillation ([place]) 01/2019   Bilateral carpal tunnel syndrome    CAD (coronary artery disease)    a.) LHC 2004 --> normal coronaries. b.) normal stress test in 2007 and 2011; c.) Lexiscan 05/20/2014 --> LVEF 55-65%; no significant stress induced ischemia/arrythmia. d.) CT chest 03/25/2019 --> coronaries carcified.   Carotid atherosclerosis, bilateral    Carpal tunnel syndrome, bilateral    Chronic anticoagulation    a.) ASA + apixaban   COPD (chronic obstructive pulmonary disease) (HCC)    CVA (cerebral vascular accident) ([place])    Degenerative disc disease, cervical    Diastolic dysfunction    a.) TTE 05/29/2014 --> LVEF 60-65%; G1DD.   DVT (deep venous thrombosis) (HCC)    GERD (gastroesophageal reflux  disease)    History of 2019 novel coronavirus disease (COVID-19) 02/08/2019   HLD (hyperlipidemia)    Hypertension    Kidney stones    Osteoarthritis of right shoulder    Pneumonia    Respiratory failure, acute ([place]) 09/20/2020   a.) severe respiratory distress 1 hour after urological surgery. CXR (+) for acute pulmonary edema. Transferred to ICU and placed on NIPPV. Questionable aspiration PNA. (+) A.fib with RVR. Improved with ABX, diuresis, and amiodarone.   Skin cancer of face    a.) RIGHT ear and RIGHT forehead; excised.   Syncope    TIA (transient ischemic attack) 2016   Valvular regurgitation    a.) TTE 05/26/2014 --> LVEF 60-65%; trivial MR, mild TR; no AR or PR. b.) TTE 04/20/2015 --> LVEF 55-60%; trivial MR and PR; no AR or TR.    Medications:  Eliquis 5 mg BID -- last dose > 72 hours Heparin 18,000 units total during PCI 11/14/20 between 1100-1200  Assessment: 80 year old male with PMH of Afib/DVT on eliquis PTA initially presented to ED 11/11/20 with SOB  and dark stool. Eliquis has been held due to GIB. 11/14/20 patient admitted to ICU for STEMI/cardiogenic shock. S/p emergent PCI. Hbg stabilized. Cleared by GI to initiate systemic anticoagulation. Pharmacy has been consulted for heparin infusion  Goal of Therapy:  Heparin level 0.3-0.7 units/ml aPTT 66-102 seconds Monitor platelets by anticoagulation protocol: Yes  1106 0127 HL 0.34, aPTT 70 therapeutic x 1   Plan:  Continue heparin infusion at 1200 units/hr HL & aPTT are correlating.  Will follow HL.  Recheck HL in 8 hours to confirm CBC daily while on heparin  Renda Rolls, PharmD, [facility] 11/15/2020 3:31 AM

## 2020-11-15 NOTE — Consult Note (Signed)
ANTICOAGULATION CONSULT NOTE  Pharmacy Consult for Heparin infusion Indication: chest pain/ACS  Patient Measurements: Height: 6\' 2"  (188 cm) Weight: 86.4 kg (190 lb 7.6 oz) IBW/kg (Calculated) : 82.2 Heparin Dosing Weight: 86.4 kg   Labs: Recent Labs    11/14/20 0945 11/14/20 1342 11/14/20 1837 11/15/20 0016 11/15/20 0127 11/15/20 0401 11/15/20 0914  HGB 9.3* 10.6*  --   --   --  10.5*  --   HCT 30.0* 34.1*  --   --   --  33.4*  --   PLT 208 206  --   --   --  234  --   APTT 31  --   --   --  70*  --   --   HEPARINUNFRC  --   --   --   --  0.34  --  0.35  CREATININE 1.93* 1.99*  --   --   --  2.17*  --   TROPONINIHS 1,581* 5,132* 15,951* >24,000*  --  >24,000*  --      Estimated Creatinine Clearance: 32.1 mL/min (A) (by C-G formula based on SCr of 2.17 mg/dL (H)).   Medical History: Past Medical History:  Diagnosis Date   Anginal pain ([place])    Anxiety    Aortic atherosclerosis ([place])    Atrial fibrillation ([place]) 01/2019   Bilateral carpal tunnel syndrome    CAD (coronary artery disease)    a.) LHC 2004 --> normal coronaries. b.) normal stress test in 2007 and 2011; c.) Lexiscan 05/20/2014 --> LVEF 55-65%; no significant stress induced ischemia/arrythmia. d.) CT chest 03/25/2019 --> coronaries carcified.   Carotid atherosclerosis, bilateral    Carpal tunnel syndrome, bilateral    Chronic anticoagulation    a.) ASA + apixaban   COPD (chronic obstructive pulmonary disease) (HCC)    CVA (cerebral vascular accident) ([place])    Degenerative disc disease, cervical    Diastolic dysfunction    a.) TTE 05/29/2014 --> LVEF 60-65%; G1DD.   DVT (deep venous thrombosis) (HCC)    GERD (gastroesophageal reflux disease)    History of 2019 novel coronavirus disease (COVID-19) 02/08/2019   HLD (hyperlipidemia)    Hypertension    Kidney stones    Osteoarthritis of right shoulder    Pneumonia    Respiratory failure, acute ([place]) 09/20/2020   a.) severe respiratory distress 1 hour  after urological surgery. CXR (+) for acute pulmonary edema. Transferred to ICU and placed on NIPPV. Questionable aspiration PNA. (+) A.fib with RVR. Improved with ABX, diuresis, and amiodarone.   Skin cancer of face    a.) RIGHT ear and RIGHT forehead; excised.   Syncope    TIA (transient ischemic attack) 2016   Valvular regurgitation    a.) TTE 05/26/2014 --> LVEF 60-65%; trivial MR, mild TR; no AR or PR. b.) TTE 04/20/2015 --> LVEF 55-60%; trivial MR and PR; no AR or TR.    Medications:  Eliquis 5 mg BID -- last dose > 72 hours Heparin 18,000 units total during PCI 11/14/20 between 1100-1200  Assessment: 80 year old male with PMH of Afib/DVT on eliquis PTA initially presented to ED 11/11/20 with SOB and dark stool. Eliquis has been held due to GIB. 11/14/20 patient admitted to ICU for STEMI/cardiogenic shock. S/p emergent PCI. Hbg stabilized. Cleared by GI to initiate systemic anticoagulation. Pharmacy has been consulted for heparin infusion  Goal of Therapy:  Heparin level 0.3-0.7 units/ml aPTT 66-102 seconds Monitor platelets by anticoagulation protocol: Yes  1106 0127 HL 0.34,  aPTT 70 therapeutic x 1 1106 0914 HL 0.35, therapeutic x 2   Plan:  --Heparin level is therapeutic x 2 --Continue heparin infusion at 1200 units/hr --Re-check HL tomorrow AM --CBC daily while on heparin  Benita Gutter  11/15/2020 10:17 AM

## 2020-11-15 NOTE — Progress Notes (Signed)
Cephas Darby, MD [address]  [place]  [place], [place] [address]  Main: [phone]  Fax: [phone] Pager: [phone]   Subjective: Patient is currently off high flow nasal cannula.  He reports nausea and nonbloody nonbilious emesis, decreased appetite.  Patient denies abdominal pain.  He did not have any bowel movements since arrival to ICU.  Patient is on heparin drip as well as Levophed.  Off dopamine and has been hemodynamically stable.  Objective: Vital signs in last 24 hours: Vitals:   11/15/20 1000 11/15/20 1100 11/15/20 1200 11/15/20 1457  BP: 111/68 93/66 103/72   Pulse: 74 (!) 39 72   Resp: 19 20 (!) 24   Temp:      TempSrc:      SpO2: 99% 98% 100% 96%  Weight:      Height:       Weight change:   Intake/Output Summary (Last 24 hours) at 11/15/2020 1711 Last data filed at 11/15/2020 1000 Gross per 24 hour  Intake 1098.16 ml  Output 935 ml  Net 163.16 ml     Exam: Heart:: Regular rate and rhythm or S1S2 present Lungs: rales bilateral Abdomen: soft, nontender, normal bowel sounds   Lab Results: CBC Latest Ref Rng & Units 11/15/2020 11/14/2020 11/14/2020  WBC 4.0 - 10.5 K/uL 16.1(H) 23.1(H) 19.3(H)  Hemoglobin 13.0 - 17.0 g/dL 10.5(L) 10.6(L) 9.3(L)  Hematocrit 39.0 - 52.0 % 33.4(L) 34.1(L) 30.0(L)  Platelets 150 - 400 K/uL 234 206 208   CMP Latest Ref Rng & Units 11/15/2020 11/14/2020 11/14/2020  Glucose 70 - 99 mg/dL 145(H) - 84  BUN 8 - 23 mg/dL 45(H) - 35(H)  Creatinine 0.61 - 1.24 mg/dL 2.17(H) 1.99(H) 1.93(H)  Sodium 135 - 145 mmol/L 138 - 140  Potassium 3.5 - 5.1 mmol/L 4.3 - 4.4  Chloride 98 - 111 mmol/L 102 - 100  CO2 22 - 32 mmol/L 27 - 25  Calcium 8.9 - 10.3 mg/dL 7.9(L) - 8.5(L)  Total Protein 6.5 - 8.1 g/dL - - -  Total Bilirubin 0.3 - 1.2 mg/dL - - -  Alkaline Phos 38 - 126 U/L - - -  AST 15 - 41 U/L - - -  ALT 0 - 44 U/L - - -    Micro Results: Recent Results (from the past 240 hour(s))  Resp Panel by RT-PCR (Flu  A&B, Covid) Nasopharyngeal Swab     Status: None   Collection Time: 11/11/20  1:40 PM   Specimen: Nasopharyngeal Swab; Nasopharyngeal(NP) swabs in vial transport medium  Result Value Ref Range Status   SARS Coronavirus 2 by RT PCR NEGATIVE NEGATIVE Final    Comment: (NOTE) SARS-CoV-2 target nucleic acids are NOT DETECTED.  The SARS-CoV-2 RNA is generally detectable in upper respiratory specimens during the acute phase of infection. The lowest concentration of SARS-CoV-2 viral copies this assay can detect is 138 copies/mL. A negative result does not preclude SARS-Cov-2 infection and should not be used as the sole basis for treatment or other patient management decisions. A negative result may occur with  improper specimen collection/handling, submission of specimen other than nasopharyngeal swab, presence of viral mutation(s) within the areas targeted by this assay, and inadequate number of viral copies(<138 copies/mL). A negative result must be combined with clinical observations, patient history, and epidemiological information. The expected result is Negative.  Fact Sheet for Patients:  EntrepreneurPulse.com.au  Fact Sheet for Healthcare Providers:  IncredibleEmployment.be  This test is no t yet approved or cleared by  the [employer] and  has been authorized for detection and/or diagnosis of SARS-CoV-2 by FDA under an Emergency Use Authorization (EUA). This EUA will remain  in effect (meaning this test can be used) for the duration of the COVID-19 declaration under Section 564(b)(1) of the Act, 21 U.S.C.section 360bbb-3(b)(1), unless the authorization is terminated  or revoked sooner.       Influenza A by PCR NEGATIVE NEGATIVE Final   Influenza B by PCR NEGATIVE NEGATIVE Final    Comment: (NOTE) The Xpert Xpress SARS-CoV-2/FLU/RSV plus assay is intended as an aid in the diagnosis of influenza from Nasopharyngeal swab specimens  and should not be used as a sole basis for treatment. Nasal washings and aspirates are unacceptable for Xpert Xpress SARS-CoV-2/FLU/RSV testing.  Fact Sheet for Patients: EntrepreneurPulse.com.au  Fact Sheet for Healthcare Providers: IncredibleEmployment.be  This test is not yet approved or cleared by the [place] FDA and has been authorized for detection and/or diagnosis of SARS-CoV-2 by FDA under an Emergency Use Authorization (EUA). This EUA will remain in effect (meaning this test can be used) for the duration of the COVID-19 declaration under Section 564(b)(1) of the Act, 21 U.S.C. section 360bbb-3(b)(1), unless the authorization is terminated or revoked.  Performed at [facility], [address] [place], [place] [address]    Studies/Results: CARDIAC CATHETERIZATION  Result Date: 11/14/2020   Ost RCA lesion is 95% stenosed.   Prox RCA to Dist RCA lesion is 100% stenosed. -Very minimal flow.  After initial angiography, it disappeared to progressed to the ostium.   Left-to-right collaterals via septal perforators fill the PDA system.   Mid LM to Dist LM lesion is 20% stenosed.   Ost LAD to Prox LAD lesion is 25% stenosed.  Relatively small caliber vessel that wraps the apex.   Diminutive LCx with moderate sized OM1, mild disease.   The left ventricular systolic function is normal. No obvious regional wall motion normalities.   The left ventricular ejection fraction is 55-65% by visual estimate.   LV end diastolic pressure is moderately elevated.  18 mmHg   There is no aortic valve stenosis. SUMMARY Cardiogenic shock-SCAI scale A-B => not favorable for mechanical support due to ongoing anemia, lack of active anginal symptoms and preserved EF with minimally elevated LVEDP. Severe single-vessel CAD with 95% ostial RCA and 1% flush occlusion of the proximal RCA- Unable to engage with guide catheter to wire the vessel. Does not correlate  with anterior elevations and no wall motion normalities Minimal diffuse LAD disease with left-to-right collaterals filling the PDA and part of the PL system. Preserved LVEF of 50 to 55% with no obvious wall motion abnormalities. Successful Right IJ Triple-Lumen Catheter placed RECOMMENDATIONS Would not treat with antiplatelet agent given total occluded vessel and need for DOAC for A. fib.  Hold off on anticoagulation until hemodynamically stable Continue to titrate Levophed and dopamine for blood pressure support Transfuse to keep hemoglobin greater than 10 Consider possibility of RV involvement as potential cause of shock. Glenetta Hew, MD  DG Chest Port 1 View  Result Date: 11/15/2020 CLINICAL DATA:  Acute respiratory failure with hypoxia EXAM: PORTABLE CHEST 1 VIEW COMPARISON:  Chest radiograph from one day prior. FINDINGS: Right internal jugular central venous catheter terminates at the cavoatrial junction. Pacer pads overlie the left chest. Stable cardiomediastinal silhouette with mild cardiomegaly. No pneumothorax. No pleural effusion. Mild pulmonary edema, similar. Mild to moderate bibasilar atelectasis, worsened. IMPRESSION: 1. Stable mild congestive heart failure. 2. Mild  to moderate bibasilar atelectasis, worsened. Electronically Signed   By: Ilona Sorrel M.D.   On: 11/15/2020 07:08   DG Chest Port 1 View  Result Date: 11/14/2020 CLINICAL DATA:  Central line placement. Hypoxia. Congestive heart failure. EXAM: PORTABLE CHEST 1 VIEW COMPARISON:  Prior today FINDINGS: A new right jugular central venous catheter is seen with tip overlying the proximal right atrium. No evidence of pneumothorax. Mild cardiomegaly stable. Diffuse interstitial infiltrates remains stable. Loculated fluid within the left major fissure is stable, and mild atelectasis or scarring is again seen in the right upper lobe. IMPRESSION: New right jugular central venous catheter tip overlies the proximal right atrium. No evidence of  pneumothorax. Stable cardiomegaly and diffuse interstitial infiltrates/edema. Stable small loculated left pleural effusion, and right upper lobe atelectasis versus scarring. Electronically Signed   By: Marlaine Hind M.D.   On: 11/14/2020 14:11   DG Chest Port 1 View  Result Date: 11/14/2020 CLINICAL DATA:  Hypoxia EXAM: PORTABLE CHEST 1 VIEW COMPARISON:  11/11/2020 chest radiograph. FINDINGS: Stable cardiomediastinal silhouette with mild cardiomegaly. No pneumothorax. Focal opacity overlying the peripheral mid to upper left lung appears more discrete. No right pleural effusion. Similar mild pulmonary edema. Stable small focal lung opacity in the peripheral upper right lung. IMPRESSION: 1. Similar mild congestive heart failure. 2. Focal opacity overlying the peripheral mid to upper left lung appears more discrete. Stable smaller focal peripheral upper right lung opacity. These may represent loculated pleural effusions and/or pulmonary nodules. Suggest further evaluation with noncontrast chest CT. Electronically Signed   By: Ilona Sorrel M.D.   On: 11/14/2020 07:26   ECHOCARDIOGRAM COMPLETE  Result Date: 11/15/2020    ECHOCARDIOGRAM REPORT   Patient Name:   AMOL DOMANSKI Date of Exam: 11/15/2020 Medical Rec #:  [id number]     Height:       74.0 in Accession #:    [id number]    Weight:       190.5 lb Date of Birth:  [birth year]     BSA:          2.129 m Patient Age:    15 years      BP:           105/60 mmHg Patient Gender: M             HR:           81 bpm. Exam Location:  [facility] Procedure: 2D Echo Indications:     Myocardial Infarct I21.9  History:         Patient has prior history of Echocardiogram examinations, most                  recent 10/23/2020.  Sonographer:     Kathlen Brunswick RDCS Referring Phys:  [place] Diagnosing Phys: [place]  1. Left ventricular ejection fraction, by estimation, is 60 to 65%. The left ventricle has normal function. The left ventricle demonstrates  regional wall motion abnormalities (see scoring diagram/findings for description). Left ventricular diastolic parameters are consistent with Grade I diastolic dysfunction (impaired relaxation).  2. Right ventricular systolic function is severely reduced. The right ventricular size is severely enlarged.  3. Left atrial size was severely dilated.  4. Right atrial size was severely dilated.  5. The mitral valve is normal in structure. Mild mitral valve regurgitation. No evidence of mitral stenosis. Moderate mitral annular calcification.  6. The aortic valve is normal in structure. Aortic valve regurgitation is not visualized. Mild  aortic valve sclerosis is present, with no evidence of aortic valve stenosis.  7. The inferior vena cava is normal in size with greater than 50% respiratory variability, suggesting right atrial pressure of 3 mmHg. FINDINGS  Left Ventricle: Left ventricular ejection fraction, by estimation, is 60 to 65%. The left ventricle has normal function. The left ventricle demonstrates regional wall motion abnormalities. The left ventricular internal cavity size was normal in size. There is no left ventricular hypertrophy. Left ventricular diastolic parameters are consistent with Grade I diastolic dysfunction (impaired relaxation). Right Ventricle: The right ventricular size is severely enlarged. No increase in right ventricular wall thickness. Right ventricular systolic function is severely reduced. Left Atrium: Left atrial size was severely dilated. Right Atrium: Right atrial size was severely dilated. Pericardium: There is no evidence of pericardial effusion. Mitral Valve: The mitral valve is normal in structure. Moderate mitral annular calcification. Mild mitral valve regurgitation. No evidence of mitral valve stenosis. Tricuspid Valve: The tricuspid valve is normal in structure. Tricuspid valve regurgitation is mild . No evidence of tricuspid stenosis. Aortic Valve: The aortic valve is normal in  structure. Aortic valve regurgitation is not visualized. Mild aortic valve sclerosis is present, with no evidence of aortic valve stenosis. Aortic valve peak gradient measures 5.5 mmHg. Pulmonic Valve: The pulmonic valve was normal in structure. Pulmonic valve regurgitation is not visualized. No evidence of pulmonic stenosis. Aorta: The aortic root is normal in size and structure. Venous: The inferior vena cava is normal in size with greater than 50% respiratory variability, suggesting right atrial pressure of 3 mmHg. IAS/Shunts: No atrial level shunt detected by color flow Doppler.  LEFT VENTRICLE PLAX 2D LVIDd:         3.74 cm   Diastology LVIDs:         2.69 cm   LV e' medial:    5.77 cm/s LV PW:         1.26 cm   LV E/e' medial:  20.8 LV IVS:        1.18 cm   LV e' lateral:   7.83 cm/s LVOT diam:     1.80 cm   LV E/e' lateral: 15.3 LV SV:         29 LV SV Index:   14 LVOT Area:     2.54 cm  RIGHT VENTRICLE RV Basal diam:  4.32 cm RV S prime:     4.79 cm/s TAPSE (M-mode): 1.1 cm LEFT ATRIUM             Index        RIGHT ATRIUM           Index LA diam:        3.90 cm 1.83 cm/m   RA Area:     18.80 cm LA Vol (A2C):   42.4 ml 19.92 ml/m  RA Volume:   56.50 ml  26.54 ml/m LA Vol (A4C):   36.0 ml 16.91 ml/m LA Biplane Vol: 40.7 ml 19.12 ml/m  AORTIC VALVE                 PULMONIC VALVE AV Area (Vmax): 1.65 cm     PV Vmax:       0.94 m/s AV Vmax:        117.00 cm/s  PV Peak grad:  3.5 mmHg AV Peak Grad:   5.5 mmHg LVOT Vmax:      75.80 cm/s LVOT Vmean:     44.200 cm/s LVOT VTI:  0.113 m  AORTA Ao Root diam: 3.20 cm Ao Asc diam:  2.70 cm MITRAL VALVE                TRICUSPID VALVE MV Area (PHT): 3.76 cm     TV Peak grad:   17.4 mmHg MV Decel Time: 202 msec     TV Vmax:        2.08 m/s MV E velocity: 120.00 cm/s                             SHUNTS                             Systemic VTI:  0.11 m                             Systemic Diam: 1.80 cm Neoma Laming Electronically signed by Neoma Laming Signature  Date/Time: 11/15/2020/10:38:00 AM    Final    Medications: I have reviewed the patient's current medications. Prior to Admission:  Medications Prior to Admission  Medication Sig Dispense Refill Last Dose   acetaminophen (TYLENOL) 500 MG tablet Take 2 tablets (1,000 mg total) by mouth every 6 (six) hours as needed for mild pain.      apixaban (ELIQUIS) 5 MG TABS tablet Take 1 tablet (5 mg total) by mouth 2 (two) times daily. 60 tablet 2    aspirin EC 81 MG tablet Take 81 mg by mouth daily after supper.      atorvastatin (LIPITOR) 40 MG tablet Take 40 mg by mouth daily after supper.      cetirizine (ZYRTEC) 10 MG tablet Take 10 mg by mouth daily.      famotidine (PEPCID) 20 MG tablet One after supper 30 tablet 11    Ferrous Gluconate (IRON) 240 (27 Fe) MG TABS Take 27 mg by mouth daily after supper.      guaiFENesin-dextromethorphan (ROBITUSSIN DM) 100-10 MG/5ML syrup Take 5 mLs by mouth every 4 (four) hours as needed for cough. 118 mL 0    montelukast (SINGULAIR) 10 MG tablet Take 10 mg by mouth at bedtime.      pantoprazole (PROTONIX) 40 MG tablet Take 40 mg by mouth daily after supper.      phenazopyridine (PYRIDIUM) 200 MG tablet Take 1 tablet (200 mg total) by mouth 3 (three) times daily as needed (bladder pain). 10 tablet 0    polyethylene glycol (MIRALAX / GLYCOLAX) 17 g packet Take 17 g by mouth daily as needed for mild constipation or moderate constipation. (Patient taking differently: Take 17 g by mouth daily.) 14 each 0    SYMBICORT 160-4.5 MCG/ACT inhaler Inhale 2 puffs into the lungs 2 (two) times daily as needed (shortness of breath).      tamsulosin (FLOMAX) 0.4 MG CAPS capsule TAKE 1 CAPSULE BY MOUTH EVERY DAY 14 capsule 0    Scheduled:  sodium chloride   Intravenous Once   aspirin EC  81 mg Oral Daily   atorvastatin  40 mg Oral QPC supper   Chlorhexidine Gluconate Cloth  6 each Topical Daily   ferrous gluconate  324 mg Oral QPC supper   ipratropium-albuterol  3 mL Nebulization  Q6H   methocarbamol  500 mg Oral TID   metoprolol tartrate  12.5 mg Oral BID   mometasone-formoterol  2 puff Inhalation BID  montelukast  10 mg Oral QHS   pantoprazole (PROTONIX) IV  40 mg Intravenous Q12H   sodium chloride flush  10-40 mL Intracatheter Q12H   sodium chloride flush  3 mL Intravenous Q12H   tamsulosin  0.4 mg Oral Daily   Continuous:  sodium chloride     sodium chloride     DOPamine Stopped (11/15/20 0600)   heparin 1,200 Units/hr (11/15/20 1317)   norepinephrine (LEVOPHED) Adult infusion 8 mcg/min (11/15/20 0600)   promethazine (PHENERGAN) injection (IM or IVPB) Stopped (11/15/20 0508)   VXB:LTJQZE chloride, acetaminophen, albuterol, atropine, dextromethorphan-guaiFENesin, hydrALAZINE, nitroGLYCERIN, ondansetron (ZOFRAN) IV, phenazopyridine, polyethylene glycol, promethazine (PHENERGAN) injection (IM or IVPB), sodium chloride flush, sodium chloride flush Anti-infectives (From admission, onward)    None      Scheduled Meds:  sodium chloride   Intravenous Once   aspirin EC  81 mg Oral Daily   atorvastatin  40 mg Oral QPC supper   Chlorhexidine Gluconate Cloth  6 each Topical Daily   ferrous gluconate  324 mg Oral QPC supper   ipratropium-albuterol  3 mL Nebulization Q6H   methocarbamol  500 mg Oral TID   metoprolol tartrate  12.5 mg Oral BID   mometasone-formoterol  2 puff Inhalation BID   montelukast  10 mg Oral QHS   pantoprazole (PROTONIX) IV  40 mg Intravenous Q12H   sodium chloride flush  10-40 mL Intracatheter Q12H   sodium chloride flush  3 mL Intravenous Q12H   tamsulosin  0.4 mg Oral Daily   Continuous Infusions:  sodium chloride     sodium chloride     DOPamine Stopped (11/15/20 0600)   heparin 1,200 Units/hr (11/15/20 1317)   norepinephrine (LEVOPHED) Adult infusion 8 mcg/min (11/15/20 0600)   promethazine (PHENERGAN) injection (IM or IVPB) Stopped (11/15/20 0508)   PRN Meds:.sodium chloride, acetaminophen, albuterol, atropine,  dextromethorphan-guaiFENesin, hydrALAZINE, nitroGLYCERIN, ondansetron (ZOFRAN) IV, phenazopyridine, polyethylene glycol, promethazine (PHENERGAN) injection (IM or IVPB), sodium chloride flush, sodium chloride flush   Assessment: Principal Problem:   Acute on chronic diastolic CHF (congestive heart failure) (HCC) Active Problems:   HTN (hypertension)   COPD, moderate (HCC)   Acute on chronic respiratory failure with hypoxia (HCC)   Atrial fibrillation (HCC)   DVT (deep venous thrombosis) (HCC)   HLD (hyperlipidemia)   Stroke (HCC)   CAD (coronary artery disease)   NSTEMI (non-ST elevated myocardial infarction) ([place])   Hypokalemia   Thrombocytopenia (HCC)   Iron deficiency anemia   GI bleeding   Acute ST elevation myocardial infarction (STEMI) of anterior wall ([place])   Cardiogenic shock ([place])   Hemorrhagic shock [facility])  Mr. Bonito is a 80 year old male with history of hypertension, hyperlipidemia, history of A. fib on Eliquis, history of DVT, COPD on 2 L home oxygen, history of CVA, GERD, diastolic heart failure, carotid artery stenosis, coronary artery disease is admitted on 11/11/2020 secondary to progressively worsening shortness of breath, increased respiratory effort and dark stools.  Patient had significantly elevated troponins on admission which were thought to be secondary to demand ischemia in setting of anemia.  GI was consulted for anemia as well as heme positive stool.  Patient's lowest hemoglobin was 7.7 on 11/4, dropped from 8.2 on 11/2 which is at the time of admission.  His hemoglobin was 9.7 on 10/28.  Patient received blood transfusion and responded appropriately, his hemoglobin is 10.6 today.  Patient had normal BUN/creatinine at the time of admission.  No known history of iron deficiency anemia Patient underwent EGD for dysphagia  on 11/06/2020 which was unremarkable  Planned colonoscopy on 11/14/20 has been canceled as patient developed STEMI on 11/5 AM, and moved to ICU.  He  underwent cardiac catheterization which revealed severe single-vessel coronary artery disease with 95% ostial RCA, this lesion was not amenable for PCI, preserved EF 50 to 55% with no obvious wall motion abnormalities.  Patient is currently being treated for cardiogenic shock  Plan: Normocytic anemia, FOBT positive with no evidence of active GI bleed Patient's hemoglobin has been maintained above 8, he did not require any blood transfusions within last 24 hours, 10.5 today Patient is no longer experiencing previously reported dark stools after the bowel prep Likely etiology small bowel AVMs or erosions and possibly a very slow GI bleed if any Will defer any endoscopic procedures at this time as patient is slowly recovering from cardiogenic shock.  Still on Levophed Given his history of significant coronary artery disease and history of A. fib, okay to start aspirin 81 mg daily as well as therapeutic heparin from GI standpoint Restart diet as tolerated per ICU protocol Recommend Phenergan 12.5 to 25 mg before each meal as needed for nausea Patient already received blood transfusions during this admission, therefore iron panel would not be reliable.  His anemia is likely secondary to anemia of chronic disease or occult GI bleed If patient develops any signs or symptoms suggestive of active GI bleed, we will readdress the role of endoscopic evaluation at that time  Cardiogenic shock secondary to severe coronary artery disease and right heart strain Dopamine drip has been discontinued and currently on norepinephrine drip Cardiology on board Continue aspirin 81 mg daily On heparin drip  GI will follow along with you.  Dr. Alice Reichert will cover from tomorrow    LOS: 4 days   Jalyiah Shelley 11/15/2020, 5:11 PM

## 2020-11-16 ENCOUNTER — Encounter: Payer: Self-pay | Admitting: Cardiology

## 2020-11-16 ENCOUNTER — Ambulatory Visit: Payer: Medicare HMO | Admitting: Internal Medicine

## 2020-11-16 DIAGNOSIS — I2109 ST elevation (STEMI) myocardial infarction involving other coronary artery of anterior wall: Secondary | ICD-10-CM

## 2020-11-16 LAB — HEPARIN LEVEL (UNFRACTIONATED): Heparin Unfractionated: 0.34 IU/mL (ref 0.30–0.70)

## 2020-11-16 LAB — PREPARE RBC (CROSSMATCH)

## 2020-11-16 LAB — RENAL FUNCTION PANEL
Albumin: 3.1 g/dL — ABNORMAL LOW (ref 3.5–5.0)
Anion gap: 9 (ref 5–15)
BUN: 44 mg/dL — ABNORMAL HIGH (ref 8–23)
CO2: 28 mmol/L (ref 22–32)
Calcium: 8.1 mg/dL — ABNORMAL LOW (ref 8.9–10.3)
Chloride: 98 mmol/L (ref 98–111)
Creatinine, Ser: 2.01 mg/dL — ABNORMAL HIGH (ref 0.61–1.24)
GFR, Estimated: 33 mL/min — ABNORMAL LOW (ref >60)
Glucose, Bld: 133 mg/dL — ABNORMAL HIGH (ref 70–99)
Phosphorus: 4.5 mg/dL (ref 2.5–4.6)
Potassium: 4 mmol/L (ref 3.5–5.1)
Sodium: 135 mmol/L (ref 135–145)

## 2020-11-16 LAB — CBC
HCT: 30 % — ABNORMAL LOW (ref 39.0–52.0)
Hemoglobin: 9.4 g/dL — ABNORMAL LOW (ref 13.0–17.0)
MCH: 28.8 pg (ref 26.0–34.0)
MCHC: 31.3 g/dL (ref 30.0–36.0)
MCV: 92 fL (ref 80.0–100.0)
Platelets: 189 10*3/uL (ref 150–400)
RBC: 3.26 MIL/uL — ABNORMAL LOW (ref 4.22–5.81)
RDW: 16.3 % — ABNORMAL HIGH (ref 11.5–15.5)
WBC: 12.7 10*3/uL — ABNORMAL HIGH (ref 4.0–10.5)
nRBC: 0.6 % — ABNORMAL HIGH (ref 0.0–0.2)

## 2020-11-16 LAB — POCT ACTIVATED CLOTTING TIME
Activated Clotting Time: 225 seconds
Activated Clotting Time: 260 seconds

## 2020-11-16 LAB — MAGNESIUM: Magnesium: 2.3 mg/dL (ref 1.7–2.4)

## 2020-11-16 MED ORDER — ADULT MULTIVITAMIN W/MINERALS CH
1.0000 | ORAL_TABLET | Freq: Every day | ORAL | Status: DC
  Administered 2020-11-17 – 2020-12-02 (×16): 1 via ORAL
  Filled 2020-11-16 (×16): qty 1

## 2020-11-16 MED ORDER — ENSURE ENLIVE PO LIQD
237.0000 mL | Freq: Three times a day (TID) | ORAL | Status: DC
  Administered 2020-11-16 – 2020-11-17 (×5): 237 mL via ORAL

## 2020-11-16 MED ORDER — LABETALOL HCL 5 MG/ML IV SOLN
INTRAVENOUS | Status: AC
  Filled 2020-11-16: qty 4

## 2020-11-16 NOTE — TOC Initial Note (Signed)
Transition of Care [facility]) - Initial/Assessment Note    Patient Details  Name: [name] Browning MRN: [id number] Date of Birth: Dec 07, 1940  Transition of Care [facility]) CM/SW Contact:    Ova Freshwater Phone Number: [phone] 11/16/2020, 1:40 PM  Clinical Narrative:                  Patient presents to [facility] from home due to increased SOB since discharge from hospital on 11/06/2020.  Patient lives with his granddaughter [name] Browning [phone].  CSW spoke with patient's daughter [name] Browning [phone], for collateral information.  Ms. Dara Lords stated the patient is mainly independent at home with all ADLs, except driving.  Ms. Dara Lords stated either she or her daughter [name] transport the patient to his doctor's appointments.  CSW spoke about role of TOC in patient care, explained SNF and home health, processes and difference.  Ms. Dara Lords stated they are not interested in SNF placement regardless of recommendation. She stated she would speak with her daughter [name], about home health.  Ms. Dara Lords does not think the patient will need home health and stated they have a many health care providers in their family including physical therapists and nurses and she believes they will manage with the patient's care at home.  Ms. Dara Lords stated she will update this CSW on the conversation with her daughter about home health. TOC will continue to follow.  Expected Discharge Plan: Home/Self Care Barriers to Discharge: Continued Medical Work up   Patient Goals and CMS Choice Patient states their goals for this hospitalization and ongoing recovery are:: Patient wants to return home, regadless of recommendations.      Expected Discharge Plan and Services Expected Discharge Plan: Home/Self Care In-house Referral: Clinical Social Work     Living arrangements for the past 2 months: Mobile Home                                      Prior Living Arrangements/Services Living arrangements  for the past 2 months: Mobile Home Lives with:: Other (Comment) (Lives with granddaughter [name]) Patient language and need for interpreter reviewed:: Yes Do you feel safe going back to the place where you live?: Yes      Need for Family Participation in Patient Care: Yes (Comment) Care giver support system in place?: Yes (comment)   Criminal Activity/Legal Involvement Pertinent to Current Situation/Hospitalization: No - Comment as needed  Activities of Daily Living Home Assistive Devices/Equipment: None ADL Screening (condition at time of admission) Patient's cognitive ability adequate to safely complete daily activities?: No Is the patient deaf or have difficulty hearing?: Yes Does the patient have difficulty seeing, even when wearing glasses/contacts?: Yes Does the patient have difficulty concentrating, remembering, or making decisions?: No Patient able to express need for assistance with ADLs?: Yes Does the patient have difficulty dressing or bathing?: No Independently performs ADLs?: Yes (appropriate for developmental age) Does the patient have difficulty walking or climbing stairs?: No Weakness of Legs: Both Weakness of Arms/Hands: Both  Permission Sought/Granted Permission sought to share information with : Facility Sport and exercise psychologist    Share Information with NAME: [name] Browning (Daughter)   [phone] (Mobile)           Emotional Assessment Appearance:: Appears stated age Attitude/Demeanor/Rapport: Engaged Affect (typically observed): Stable Orientation: : Oriented to Self, Oriented to Place, Oriented to  Time, Oriented to Situation Alcohol / Substance Use: Not  Applicable Psych Involvement: No (comment)  Admission diagnosis:  SOB (shortness of breath) [R06.02] DVT (deep venous thrombosis) (HCC) [I82.409] NSTEMI (non-ST elevated myocardial infarction) ([place]) [I21.4] Rectal bleed [K62.5] Acute on chronic diastolic CHF (congestive heart failure) (HCC)  [I50.33] Acute on chronic congestive heart failure, unspecified heart failure type [facility]) [I50.9] Patient Active Problem List   Diagnosis Date Noted   Acute ST elevation myocardial infarction (STEMI) of anterior wall ([place])    Cardiogenic shock ([place])    Hemorrhagic shock (HCC)    Acute on chronic diastolic CHF (congestive heart failure) ([place]) 11/11/2020   GI bleeding 11/11/2020   DVT (deep venous thrombosis) (HCC)    HLD (hyperlipidemia)    Stroke (HCC)    CAD (coronary artery disease)    NSTEMI (non-ST elevated myocardial infarction) (HCC)    Hypokalemia    Thrombocytopenia (HCC)    Iron deficiency anemia    Dysphagia    Lactic acidosis    Atrial fibrillation (HCC)    Septic shock ([place]) 10/21/2020   Acute on chronic respiratory failure with hypoxia ([place]) 10/20/2020   Community acquired pneumonia 10/20/2020   Hypotension 10/20/2020   DOE (dyspnea on exertion) 10/06/2020   Chronic respiratory failure with hypoxia ([place]) 10/06/2020   Malnutrition of moderate degree 09/21/2020   Urinary tract obstruction by kidney stone 09/19/2020   AKI (acute kidney injury) ([place]) 09/19/2020   COPD exacerbation ([place]) 09/18/2020   Acute respiratory failure with hypoxia ([place]) 09/18/2020   AF (paroxysmal atrial fibrillation) ([place]) 29/52/8413   Umbilical hernia without obstruction and without gangrene    Pneumonia 03/25/2019   History of 2019 novel coronavirus disease (COVID-19) 03/06/2019   COVID-19 virus infection 02/08/2019   New onset atrial fibrillation ([place]) 02/08/2019   COPD, moderate ([place]) 09/14/2018   Degenerative tear of glenoid labrum of right shoulder 08/24/2017   Injury of tendon of long head of right biceps 08/21/2017   Rotator cuff tendinitis, right 08/21/2017   Traumatic complete tear of right rotator cuff 08/21/2017   Chronic neck pain 03/09/2017   Degenerative disc disease, cervical 03/09/2017   Perennial allergic rhinitis 03/09/2017   Hyperglycemia 09/01/2015   Elevated  rheumatoid factor 04/08/2015   Arthritis of both hands 04/01/2015   Bilateral hand pain 03/18/2015   Elevated alkaline phosphatase level 03/02/2015   Anxiety 02/16/2015   Intermittent chest pain 02/16/2015   History of TIA (transient ischemic attack) 05/18/2014   HTN (hypertension) 05/18/2014   Hyperlipidemia 05/18/2014   Status post carpal tunnel release 06/26/2013   PCP:  [place] Pharmacy:   [id number] - [place], [place] - [address] [place] [address] Phone: [phone] Fax: [phone]     Social Determinants of Health (SDOH) Interventions    Readmission Risk Interventions No flowsheet data found.

## 2020-11-16 NOTE — Consult Note (Signed)
ANTICOAGULATION CONSULT NOTE  Pharmacy Consult for Heparin infusion Indication: chest pain/ACS  Patient Measurements: Height: 6\' 2"  (188 cm) Weight: 89.5 kg (197 lb 5 oz) IBW/kg (Calculated) : 82.2 Heparin Dosing Weight: 86.4 kg   Labs: Recent Labs    11/14/20 0945 11/14/20 1342 11/14/20 1837 11/15/20 0016 11/15/20 0127 11/15/20 0401 11/15/20 0914 11/16/20 0510  HGB 9.3* 10.6*  --   --   --  10.5*  --  9.4*  HCT 30.0* 34.1*  --   --   --  33.4*  --  30.0*  PLT 208 206  --   --   --  234  --  189  APTT 31  --   --   --  70*  --   --   --   HEPARINUNFRC  --   --   --   --  0.34  --  0.35 0.34  CREATININE 1.93* 1.99*  --   --   --  2.17*  --  2.01*  TROPONINIHS 1,581* 5,132* 15,951* >24,000*  --  >24,000*  --   --      Estimated Creatinine Clearance: 34.6 mL/min (A) (by C-G formula based on SCr of 2.01 mg/dL (H)).   Medical History: Past Medical History:  Diagnosis Date   Anginal pain ([place])    Anxiety    Aortic atherosclerosis ([place])    Atrial fibrillation ([place]) 01/2019   Bilateral carpal tunnel syndrome    CAD (coronary artery disease)    a.) LHC 2004 --> normal coronaries. b.) normal stress test in 2007 and 2011; c.) Lexiscan 05/20/2014 --> LVEF 55-65%; no significant stress induced ischemia/arrythmia. d.) CT chest 03/25/2019 --> coronaries carcified.   Carotid atherosclerosis, bilateral    Carpal tunnel syndrome, bilateral    Chronic anticoagulation    a.) ASA + apixaban   COPD (chronic obstructive pulmonary disease) (HCC)    CVA (cerebral vascular accident) ([place])    Degenerative disc disease, cervical    Diastolic dysfunction    a.) TTE 05/29/2014 --> LVEF 60-65%; G1DD.   DVT (deep venous thrombosis) (HCC)    GERD (gastroesophageal reflux disease)    History of 2019 novel coronavirus disease (COVID-19) 02/08/2019   HLD (hyperlipidemia)    Hypertension    Kidney stones    Osteoarthritis of right shoulder    Pneumonia    Respiratory failure, acute ([place])  09/20/2020   a.) severe respiratory distress 1 hour after urological surgery. CXR (+) for acute pulmonary edema. Transferred to ICU and placed on NIPPV. Questionable aspiration PNA. (+) A.fib with RVR. Improved with ABX, diuresis, and amiodarone.   Skin cancer of face    a.) RIGHT ear and RIGHT forehead; excised.   Syncope    TIA (transient ischemic attack) 2016   Valvular regurgitation    a.) TTE 05/26/2014 --> LVEF 60-65%; trivial MR, mild TR; no AR or PR. b.) TTE 04/20/2015 --> LVEF 55-60%; trivial MR and PR; no AR or TR.    Medications:  Eliquis 5 mg BID -- last dose > 72 hours Heparin 18,000 units total during PCI 11/14/20 between 1100-1200  Assessment: 80 year old male with PMH of Afib/DVT on eliquis PTA initially presented to ED 11/11/20 with SOB and dark stool. Eliquis has been held due to GIB. 11/14/20 patient admitted to ICU for STEMI/cardiogenic shock. S/p emergent PCI. Hbg stabilized. Cleared by GI to initiate systemic anticoagulation. Pharmacy has been consulted for heparin infusion  Goal of Therapy:  Heparin level 0.3-0.7 units/ml aPTT  66-102 seconds Monitor platelets by anticoagulation protocol: Yes  1106 0127 HL 0.34, aPTT 70 therapeutic x 1 1106 0914 HL 0.35, therapeutic x 2 1107 0510 HL 0.34, therapeutic x 3   Plan:  --Heparin level is therapeutic x 3 --Continue heparin infusion at 1200 units/hr --Re-check HL tomorrow AM --CBC daily while on heparin  Renda Rolls, PharmD, [facility] 11/16/2020 7:02 AM

## 2020-11-16 NOTE — Progress Notes (Signed)
NAME:  [name] Browning, MRN:  [id number], DOB:  Aug 17, 1940, LOS: 5 ADMISSION DATE:  11/11/2020  80 yo WM admitted 11/2 for SOB, acute sCHF exacerbation, Placed on biPAP. CODE STEMI CALLED 11/5 ST elevations V2,V3 S/p CATH-NO intervention,+demand ischemia and transferred to ICU  History of Present Illness:  [name] Browning is a 80 year old male with history of CAD (nonobstructive calcified coronary disease), atrial fibrillation on Eliquis, HFpEF, hypertension, TIA in 2016, COPD on 2 L who was admitted to the hospital with shortness of breath.  Cardiology is consulted for evaluation of an elevated troponin. Since he was discharged on 10/28, and his medications were changed at discharge. His metoprolol XL 25 mg was stopped, though they restarted this because his HR was high at home. His lasix was also stopped. He has been having increased shortness of breath, and increased LE edema. He was having some black colored stools as well.    He is admitted to the hospital 2 times in the last month.   First admission was from 10/20/2020 to 10/31/2020 when he was admitted with respiratory failure due to COPD and pneumonia, as well as an obstructive left renal stone.  He developed hypotension and worsening respiratory distress requiring pressors and BiPAP.    Blood cultures grew positive for Enterococcus faecalis and staph hemolyticus.  He was treated with antibiotics ending 11/03/2020.  The patient was recently admitted to the hospital from 10/25-10/28 with complaints of dysphagia.     He was seen by gastroenterology who underwent an EGD with no significant findings.  Since that time he has had progressive worsening shortness of breath, eventually presented to the emergency department where he was found to have an oxygen saturation of 88% on his home 2 L of oxygen.  His labs are otherwise notable for a troponin of 620, BNP of 220, hemoglobin of 8.2 (9.7 on 11/06/2020).  Chest x-ray shows diffuse bilateral infiltrates which  could be consistent with edema but some overlying scar.   Pertinent  Medical History  COPD Ischemic Cardiomyopathy  Significant Hospital Events: Including procedures, antibiotic start and stop dates in addition to other pertinent events   11/5 acute SOB and DOE on biPAP CODE STEMI 11/5 s/p CATH chronic RCA occlusion, demand ischemia causing MI 11/6 remains in cardiogenic shock, severe hypoxia 11/7 remains on low-dose levophed and continuous heparin infusion  Interim History / Subjective:  No acute events overnight.  Patient seen sitting up in bed, on levophed and heparin infusions. Denies pain, sob, or abdominal pain. + nausea some "spit-up" but no vomiting with decreased appetite on clears. +bilateral LE edema which is not normal for patient. Reassured on current status and goals today explained for pressor weaning.   Objective   Blood pressure 114/74, pulse (!) 58, temperature 98.4 F (36.9 C), temperature source Oral, resp. rate 17, height 6\' 2"  (1.88 m), weight 89.5 kg, SpO2 97 %. CVP:  [15 mmHg-27 mmHg] 17 mmHg  FiO2 (%):  [45 %] 45 %   Intake/Output Summary (Last 24 hours) at 11/16/2020 0833 Last data filed at 11/16/2020 0600 Gross per 24 hour  Intake 1331.9 ml  Output 900 ml  Net 431.9 ml   Filed Weights   11/13/20 0223 11/15/20 0500 11/16/20 0500  Weight: 86.4 kg 86.4 kg 89.5 kg     Review of Systems: Review of Systems  Constitutional:  Positive for malaise/fatigue. Negative for chills and fever.  HENT:  Negative for congestion and sore throat.   Respiratory:  Negative for  cough, sputum production and shortness of breath.   Cardiovascular:  Positive for leg swelling. Negative for chest pain, palpitations and orthopnea.  Gastrointestinal:  Positive for nausea. Negative for abdominal pain, blood in stool, constipation, diarrhea and vomiting.  Genitourinary: Negative.   Musculoskeletal: Negative.   Skin: Negative.   Neurological:  Positive for weakness and headaches.  Negative for dizziness, tingling and sensory change.   PHYSICAL EXAMINATION:  GENERAL:critically ill appearing EYES: Pupils equal, round, reactive to light.  No scleral icterus.  MOUTH: Moist mucosal membrane. NECK: Supple.  PULMONARY:  +crackles bilaterally to lung ascultation no wheezing no sob CARDIOVASCULAR: S1 and S2.  No murmurs  GASTROINTESTINAL: Soft, nontender, non-distended. Positive bowel sounds.  MUSCULOSKELETAL: + edema.  NEUROLOGIC: lethargic SKIN: intact,warm,dry  Labs/imaging that I havepersonally reviewed  (right click and "Reselect all SmartList Selections" daily)  CXR 11/6: Stable CHF, mild-mod bibasilar atelectasis worse than CXR 11/5 CXR 11/5: Stable cardiomegaly and diffuse interstitial infiltrates/edema. Stable small loculated left pleural effusion, and right upper lobe atelectasis versus scarring.   ASSESSMENT AND PLAN  80 yo WM admitted 11/2 for SOB, acute sCHF exacerbation, Placed on biPAP. CODE STEMI CALLED 11/5 ST elevations V2,V3 S/p CATH 11/5 -NO intervention,+demand ischemia and transferred to ICU   ACUTE Hypoxic and Hypercapnic Respiratory Failure - stable SEVERE COPD EXACERBATION - stable - Oxygen supplementation prn to keep O2 sats > 88%; BiPAP if needed    - Currently tolerating 2lpm nasal cannula - continue IV steroids as prescribed - continue NEB THERAPY as prescribed - High risk for intubation and cardiac arrest  CARDIAC Acute Anterior STEMI - RV infarct with occluded RCA and patent LAD s/p cath 11/5 no intervention Demand ischemia Ischemic Cardiomyopathy Acute on chronic diastolic CHF Echo 77/4: LVEF 60-65%, LV regional wall motion abnormalities, Grade 1 diastolic dysfunction, RV systolic function severely reduced with RV severe enlargement, LA + RA severely dilated - Cardiology recs noted; appreciate input - Continuous Heparin infusion - Lasix as tolerated - follow up cardiac enzymes as indicated  CARDIOGENIC  SHOCK - Use  vasopressors to keep MAP>65 as needed; wean as tol - Follow ABG and LA as needed - Albumin 25% 25mg  IV x1 dose  Anemia, acute on chronic Iron deficiency versus ? Acute blood loss secondary to GI bleed Presented with dark-colored stools and stool occult positive, noted drop in hgb which normalized s/p pRBC transfusion.  - GI following, appreciate input. Plans for further endoscopy on hold given acute cardiac  - Monitor H&H q24h, monitor for any s/s bleeding - Transfuse pRBC for hgb < 7.0 - Protonix 40 IV every 12 hours  ACUTE KIDNEY INJURY/Renal Failure -continue Foley Catheter-assess need -Avoid nephrotoxic agents -Follow urine output, BMP -Ensure adequate renal perfusion, optimize oxygenation -Renal dose medications  Intake/Output Summary (Last 24 hours) at 11/16/2020 1287 Last data filed at 11/16/2020 0600 Gross per 24 hour  Intake 1331.9 ml  Output 900 ml  Net 431.9 ml   ENDO - ICU hypoglycemic\Hyperglycemia protocol -check FSBS per protocol  NUTRITIONAL STATUS DIET-->TF's as tolerated Constipation protocol as indicated  ELECTROLYTES -follow labs as needed -replace as needed -pharmacy consultation and following  Best practice (right click and "Reselect all SmartList Selections" daily)  Diet:  as tolerated Glucose control:  SSI Yes Central venous access:  Yes, and it is still needed Arterial line:  Yes, and it is still needed Foley:  Yes, and it is still needed Mobility:  bed rest  Code Status:  FULL CODE Disposition: ICU  Labs  CBC: Recent Labs  Lab 11/14/20 0710 11/14/20 0945 11/14/20 1342 11/15/20 0401 11/16/20 0510  WBC 17.2* 19.3* 23.1* 16.1* 12.7*  NEUTROABS  --  17.7*  --   --   --   HGB 9.5* 9.3* 10.6* 10.5* 9.4*  HCT 31.4* 30.0* 34.1* 33.4* 30.0*  MCV 96.9 95.5 92.7 91.0 92.0  PLT 198 208 206 234 767    Basic Metabolic Panel: Recent Labs  Lab 11/11/20 1527 11/12/20 0435 11/13/20 0928 11/14/20 0710 11/14/20 0945 11/14/20 1342  11/15/20 0401 11/16/20 0510  NA  --  138 136 138 140  --  138 135  K  --  4.0 4.1 4.1 4.4  --  4.3 4.0  CL  --  103 99 99 100  --  102 98  CO2  --  26 28 27 25   --  27 28  GLUCOSE  --  213* 125* 85 84  --  145* 133*  BUN  --  19 27* 33* 35*  --  45* 44*  CREATININE  --  0.95 1.11 1.49* 1.93* 1.99* 2.17* 2.01*  CALCIUM  --  8.4* 8.6* 8.3* 8.5*  --  7.9* 8.1*  MG 2.0 1.8  --  1.8  --   --  2.5* 2.3  PHOS  --   --   --  3.2  --   --  5.1* 4.5   GFR: Estimated Creatinine Clearance: 34.6 mL/min (A) (by C-G formula based on SCr of 2.01 mg/dL (H)). Recent Labs  Lab 11/14/20 0945 11/14/20 0950 11/14/20 1342 11/14/20 1616 11/14/20 1837 11/15/20 0401 11/16/20 0510  WBC 19.3*  --  23.1*  --   --  16.1* 12.7*  LATICACIDVEN  --    < > 4.1* 3.2* 2.9* 1.9  --    < > = values in this interval not displayed.    Liver Function Tests: Recent Labs  Lab 11/11/20 1340 11/14/20 0710 11/15/20 0401 11/16/20 0510  AST 23 156*  --   --   ALT 27 170*  --   --   ALKPHOS 85 90  --   --   BILITOT 1.3* 1.6*  --   --   PROT 5.7* 5.1*  --   --   ALBUMIN 2.9* 2.5* 3.0* 3.1*   No results for input(s): LIPASE, AMYLASE in the last 168 hours. No results for input(s): AMMONIA in the last 168 hours.  ABG    Component Value Date/Time   PHART 7.40 11/14/2020 2038   PCO2ART 34 11/14/2020 2038   PO2ART 62 (L) 11/14/2020 2038   HCO3 27.9 11/15/2020 1230   ACIDBASEDEF 3.1 (H) 11/14/2020 2038   O2SAT 83.9 11/15/2020 1230     Coagulation Profile: Recent Labs  Lab 11/11/20 1516  INR 1.5*    Cardiac Enzymes: No results for input(s): CKTOTAL, CKMB, CKMBINDEX, TROPONINI in the last 168 hours.  HbA1C: Hgb A1c MFr Bld  Date/Time Value Ref Range Status  11/11/2020 03:38 PM 6.0 (H) 4.8 - 5.6 % Final    Comment:    (NOTE) Pre diabetes:          5.7%-6.4%  Diabetes:              >6.4%  Glycemic control for   <7.0% adults with diabetes   02/09/2019 06:40 AM 5.5 4.8 - 5.6 % Final    Comment:     (NOTE) Pre diabetes:          5.7%-6.4% Diabetes:              >  6.4% Glycemic control for   <7.0% adults with diabetes     CBG: Recent Labs  Lab 11/14/20 0611  GLUCAP 100*    Allergies Allergies  Allergen Reactions   Hydromorphone     Hallucination Pt reports he doesn't know about this       Enid Cutter, PA-[facility] [place] [facility]

## 2020-11-16 NOTE — Telephone Encounter (Signed)
Pt was re-admitted to [facility] on 11/11/20.

## 2020-11-16 NOTE — Telephone Encounter (Signed)
Pt currently admitted at [facility] as of 11/16/20 10:28 AM

## 2020-11-16 NOTE — Progress Notes (Deleted)
[name] Browning, male    DOB: [birth year],  MRN: [id number]   Brief patient profile:  15 yowm quit 2005  referred to pulmonary clinic in [facility]  10/06/2020 by Dr Carola Frost NP    Baseline able to do yardwork pushing weed eating s needing any kind on inhaler but maybe once or twice a week needed inhaler then admitted with covid Jan 2021 and d/c did not need 02 but then readmitted on 03/25/19 with CAP and worse  then p last admit for abdominal pain:  Admit date: 09/18/2020 Discharge date: 09/28/2020  Recommendations for Outpatient Follow-Up:    Follow up with your primary care provider in one week.  Check CBC, BMP, magnesium in the next visit Patient has required oxygen on discharge.  Wean as possible as outpatient.     Discharge Diagnosis:    Active Problems:   HTN (hypertension)   Hyperlipidemia   COPD exacerbation (HCC)   Acute respiratory failure with hypoxia (HCC)   AF (paroxysmal atrial fibrillation) (HCC)   Urinary tract obstruction by kidney stone   AKI (acute kidney injury) ([place])   Malnutrition of moderate degree       History of Present Illness:    [name] Browning is a 80 y.o. male with medical history significant for COPD, paroxysmal atrial fibrillation on Eliquis, TIA, hyperlipidemia, anxiety and recent umbilical hernia repair who presented to hospital with concerns of increasing shortness of breath and left-sided abdominal pain.  Patient was started on steroids for COPD exacerbation.  CT scan also showed left kidney stone with hydronephrosis.  Urology placed left ureteral stent on 9/11. Post procedure, he was tachypneic, hypoxic and hypotensive requiring ICU/SD and BiPAP.  Subsequently he was in the medical service.     Hospital Course:    Following conditions were addressed during hospitalization as listed below,   Acute hypoxemic respiratory failure secondary to COPD exacerbation and pneumonia.  Patient had post op atelectasis with interstitial edema requiring BiPAP  and diuresis with lasix.  Patient will continue 2 L of oxygen on discharge.  Received IV diuretics and IV steroids.  Patient will be discharged home on prednisone taper.  Chest x-ray on 09/27/2020 showed improved aeration of both lungs.  Patient will continue oral Lasix on discharge.  He was advised to avoid overexertion.   Community-acquired pneumonia,  Patient was given Rocephin and Zithromax, has completed course of antibiotic.   Recommended chest x-ray repeat in 4 to 6 weeks to ensure resolution of infiltrate.   Obstructing left proximal ureter stone with left-sided hydronephrosis Acute kidney injury secondary to obstruction.  CKD stage II S/p left ureteral stent by urology on 09/20/20.  Renal function at baseline.  Urine culture no growth.  Patient will need to follow-up with [facility] as outpatient.    Paroxysmal atrial fibrillation. Continue Eliquis.  Rate controlled  Hypotension with a history of essential hypertension. Improved at this time.  Dose of amlodipine has been decreased.  Resume losartan on discharge.     Constipation.  Resolved with bowel regimen   AKI most likely due to dehydration.Improved with IV hydration.  Creatinine of 1.1 on discharge.        History of Present Illness  10/06/2020  Pulmonary/ 1st office eval/ [name] Browning / [employer]  Chief Complaint  Patient presents with   Consult    Pts states here for lungs  Dyspnea:  200 ft to mb slt uphill stop half way on on 2lpm POC Cough: none now  Sleep: bed is flat with lots of pillows x 30 degrees  SABA use: last used last week  Rec Please remember to go to the lab and x-ray department  for your tests - we will call you with the results when they are available. Prednisone 10 mg take  4 each am x 2 days,   2 each am x 2 days,  1 each am x 2 days and stop  Change protonix (pantoprazole) where you Take 30-60 min before first meal of the day and add pepcid 20 mg after supper  Avoid mint, menthol chocolate  or late meals - largest meal should be lunch ideally  Make sure you check your oxygen saturation  at your highest level of activity  to be sure it stays over 90% and adjust  02 flow upward to maintain this level if needed but remember to turn it back to previous settings when you stop (to conserve your supply).  Call me with discrepancies on your medications asap  - if come to hospital bring them with you  Return next available with all medications with you - if condition is getting worse on above plan, go back to ER    11/16/2020  f/u ov/Deyona Soza/ [facility] re: ***   maint on ***  No chief complaint on file.   Dyspnea:  *** Cough: *** Sleeping: *** SABA use: *** 02: *** Covid status:   ***   No obvious day to day or daytime variability or assoc excess/ purulent sputum or mucus plugs or hemoptysis or cp or chest tightness, subjective wheeze or overt sinus or hb symptoms.   *** without nocturnal  or early am exacerbation  of respiratory  c/o's or need for noct saba. Also denies any obvious fluctuation of symptoms with weather or environmental changes or other aggravating or alleviating factors except as outlined above   No unusual exposure hx or h/o childhood pna/ asthma or knowledge of premature birth.  Current Allergies, Complete Past Medical History, Past Surgical History, Family History, and Social History were reviewed in [employer] record.  ROS  The following are not active complaints unless bolded Hoarseness, sore throat, dysphagia, dental problems, itching, sneezing,  nasal congestion or discharge of excess mucus or purulent secretions, ear ache,   fever, chills, sweats, unintended wt loss or wt gain, classically pleuritic or exertional cp,  orthopnea pnd or arm/hand swelling  or leg swelling, presyncope, palpitations, abdominal pain, anorexia, nausea, vomiting, diarrhea  or change in bowel habits or change in bladder habits, change in stools or change in  urine, dysuria, hematuria,  rash, arthralgias, visual complaints, headache, numbness, weakness or ataxia or problems with walking or coordination,  change in mood or  memory.        No outpatient medications have been marked as taking for the 11/16/20 encounter (Appointment) with Tanda Rockers, MD.             Past Medical History:  Diagnosis Date   Anxiety    Aortic atherosclerosis [facility])    Atrial fibrillation ([place]) 01/2019   Bilateral carpal tunnel syndrome    CAD (coronary artery disease)    Carpal tunnel syndrome, bilateral    Chronic anticoagulation    COPD (chronic obstructive pulmonary disease) ([place])    COVID-19 virus infection 02/08/2019   Degenerative disc disease, cervical    DVT (deep venous thrombosis) (HCC)    GERD (gastroesophageal reflux disease)    HLD (hyperlipidemia)    Hypertension  Kidney stones    Leaky heart valve    Normal coronary arteries    a. by cardiac cath in 2004; b. normal stress test in 2007 and 2011; c. Lexiscan 05/20/2014: no significant ischemia, EF 55-65%, no EKG changes, low risk study    Osteoarthritis of right shoulder    Pneumonia    Skin cancer    skin cancer on right ear and right forehead removed   Syncope    TIA (transient ischemic attack) 2016      Objective:      Wt Readings from Last 3 Encounters:  11/15/20 190 lb 7.6 oz (86.4 kg)  11/06/20 190 lb (86.2 kg)  10/20/20 180 lb (81.6 kg)      Vital signs reviewed  11/16/2020  - Note at rest 02 sats  ***% on ***   General appearance:    ***        Assessment

## 2020-11-16 NOTE — Progress Notes (Signed)
SUBJECTIVE: Patient very comfortable   Vitals:   11/16/20 0323 11/16/20 0400 11/16/20 0500 11/16/20 0600  BP:  103/63 114/78 114/74  Pulse:  63 65 (!) 58  Resp:  18 19 17   Temp: 98.4 F (36.9 C)     TempSrc: Oral     SpO2:  97% 100% 97%  Weight:   89.5 kg   Height:        Intake/Output Summary (Last 24 hours) at 11/16/2020 0742 Last data filed at 11/16/2020 0600 Gross per 24 hour  Intake 1331.9 ml  Output 900 ml  Net 431.9 ml    LABS: Basic Metabolic Panel: Recent Labs    11/15/20 0401 11/16/20 0510  NA 138 135  K 4.3 4.0  CL 102 98  CO2 27 28  GLUCOSE 145* 133*  BUN 45* 44*  CREATININE 2.17* 2.01*  CALCIUM 7.9* 8.1*  MG 2.5* 2.3  PHOS 5.1* 4.5   Liver Function Tests: Recent Labs    11/14/20 0710 11/15/20 0401 11/16/20 0510  AST 156*  --   --   ALT 170*  --   --   ALKPHOS 90  --   --   BILITOT 1.6*  --   --   PROT 5.1*  --   --   ALBUMIN 2.5* 3.0* 3.1*   No results for input(s): LIPASE, AMYLASE in the last 72 hours. CBC: Recent Labs    11/14/20 0945 11/14/20 1342 11/15/20 0401 11/16/20 0510  WBC 19.3*   < > 16.1* 12.7*  NEUTROABS 17.7*  --   --   --   HGB 9.3*   < > 10.5* 9.4*  HCT 30.0*   < > 33.4* 30.0*  MCV 95.5   < > 91.0 92.0  PLT 208   < > 234 189   < > = values in this interval not displayed.   Cardiac Enzymes: No results for input(s): CKTOTAL, CKMB, CKMBINDEX, TROPONINI in the last 72 hours. BNP: Invalid input(s): POCBNP D-Dimer: No results for input(s): DDIMER in the last 72 hours. Hemoglobin A1C: No results for input(s): HGBA1C in the last 72 hours. Fasting Lipid Panel: No results for input(s): CHOL, HDL, LDLCALC, TRIG, CHOLHDL, LDLDIRECT in the last 72 hours. Thyroid Function Tests: No results for input(s): TSH, T4TOTAL, T3FREE, THYROIDAB in the last 72 hours.  Invalid input(s): FREET3 Anemia Panel: Recent Labs    11/14/20 1616  VITAMINB12 1,193*  FOLATE 22.3  FERRITIN >7,500*  TIBC 203*  IRON 35*     PHYSICAL  EXAM General: Well developed, well nourished, in no acute distress HEENT:  Normocephalic and atramatic Neck:  No JVD.  Lungs: Clear bilaterally to auscultation and percussion. Heart: HRRR . Normal S1 and S2 without gallops or murmurs.  Abdomen: Bowel sounds are positive, abdomen soft and non-tender  Msk:  Back normal, normal gait. Normal strength and tone for age. Extremities: No clubbing, cyanosis or edema.   Neuro: Alert and oriented X 3. Psych:  Good affect, responds appropriately  TELEMETRY: Sinus rhythm  ASSESSMENT AND PLAN: Status post RV infarct with occluded RCA and patent left anterior descending artery.  Patient hemodynamically stable.  Continue heparin for another day.  Hemoglobin remained stable.  Principal Problem:   Acute on chronic diastolic CHF (congestive heart failure) (HCC) Active Problems:   HTN (hypertension)   COPD, moderate (HCC)   Acute on chronic respiratory failure with hypoxia (HCC)   Atrial fibrillation (HCC)   DVT (deep venous thrombosis) (HCC)   HLD (hyperlipidemia)  Stroke [facility])   CAD (coronary artery disease)   NSTEMI (non-ST elevated myocardial infarction) (HCC)   Hypokalemia   Thrombocytopenia (HCC)   Iron deficiency anemia   GI bleeding   Acute ST elevation myocardial infarction (STEMI) of anterior wall (HCC)   Cardiogenic shock ([place])   Hemorrhagic shock (HCC)    Emmette Katt A, MD, [facility] 11/16/2020 7:42 AM

## 2020-11-17 ENCOUNTER — Encounter: Payer: Self-pay | Admitting: Registered Nurse

## 2020-11-17 DIAGNOSIS — I2109 ST elevation (STEMI) myocardial infarction involving other coronary artery of anterior wall: Secondary | ICD-10-CM | POA: Diagnosis not present

## 2020-11-17 DIAGNOSIS — J9621 Acute and chronic respiratory failure with hypoxia: Secondary | ICD-10-CM

## 2020-11-17 DIAGNOSIS — R570 Cardiogenic shock: Secondary | ICD-10-CM

## 2020-11-17 LAB — CBC
HCT: 27.6 % — ABNORMAL LOW (ref 39.0–52.0)
HCT: 30.9 % — ABNORMAL LOW (ref 39.0–52.0)
Hemoglobin: 8.6 g/dL — ABNORMAL LOW (ref 13.0–17.0)
Hemoglobin: 9.9 g/dL — ABNORMAL LOW (ref 13.0–17.0)
MCH: 28.6 pg (ref 26.0–34.0)
MCH: 29.4 pg (ref 26.0–34.0)
MCHC: 31.2 g/dL (ref 30.0–36.0)
MCHC: 32 g/dL (ref 30.0–36.0)
MCV: 91.7 fL (ref 80.0–100.0)
MCV: 91.7 fL (ref 80.0–100.0)
Platelets: 141 10*3/uL — ABNORMAL LOW (ref 150–400)
Platelets: 133 10*3/uL — ABNORMAL LOW (ref 150–400)
RBC: 3.01 MIL/uL — ABNORMAL LOW (ref 4.22–5.81)
RBC: 3.37 MIL/uL — ABNORMAL LOW (ref 4.22–5.81)
RDW: 16.3 % — ABNORMAL HIGH (ref 11.5–15.5)
RDW: 15.7 % — ABNORMAL HIGH (ref 11.5–15.5)
WBC: 9 10*3/uL (ref 4.0–10.5)
WBC: 9.4 10*3/uL (ref 4.0–10.5)
nRBC: 1.5 % — ABNORMAL HIGH (ref 0.0–0.2)
nRBC: 1.3 % — ABNORMAL HIGH (ref 0.0–0.2)

## 2020-11-17 LAB — PREPARE RBC (CROSSMATCH)

## 2020-11-17 LAB — RENAL FUNCTION PANEL
Albumin: 2.8 g/dL — ABNORMAL LOW (ref 3.5–5.0)
Anion gap: 8 (ref 5–15)
BUN: 44 mg/dL — ABNORMAL HIGH (ref 8–23)
CO2: 28 mmol/L (ref 22–32)
Calcium: 8.2 mg/dL — ABNORMAL LOW (ref 8.9–10.3)
Chloride: 96 mmol/L — ABNORMAL LOW (ref 98–111)
Creatinine, Ser: 1.67 mg/dL — ABNORMAL HIGH (ref 0.61–1.24)
GFR, Estimated: 41 mL/min — ABNORMAL LOW (ref >60)
Glucose, Bld: 114 mg/dL — ABNORMAL HIGH (ref 70–99)
Phosphorus: 4.1 mg/dL (ref 2.5–4.6)
Potassium: 3.8 mmol/L (ref 3.5–5.1)
Sodium: 132 mmol/L — ABNORMAL LOW (ref 135–145)

## 2020-11-17 LAB — HEPARIN LEVEL (UNFRACTIONATED): Heparin Unfractionated: 0.3 IU/mL (ref 0.30–0.70)

## 2020-11-17 MED ORDER — DOPAMINE-DEXTROSE 3.2-5 MG/ML-% IV SOLN: 0.0000 ug/kg/min | INTRAVENOUS | Status: DC

## 2020-11-17 MED ORDER — ALBUMIN HUMAN 25 % IV SOLN
25.0000 g | Freq: Once | INTRAVENOUS | Status: AC
  Administered 2020-11-17: 25 g via INTRAVENOUS
  Filled 2020-11-17: qty 100

## 2020-11-17 MED ORDER — SODIUM CHLORIDE 0.9% IV SOLUTION: Freq: Once | INTRAVENOUS | Status: AC

## 2020-11-17 MED ORDER — APIXABAN 5 MG PO TABS
5.0000 mg | ORAL_TABLET | Freq: Two times a day (BID) | ORAL | Status: DC
  Administered 2020-11-17 – 2020-12-02 (×31): 5 mg via ORAL
  Filled 2020-11-17 (×31): qty 1

## 2020-11-17 MED ORDER — MIDODRINE HCL 5 MG PO TABS
5.0000 mg | ORAL_TABLET | Freq: Three times a day (TID) | ORAL | Status: DC
  Administered 2020-11-17 – 2020-11-18 (×2): 5 mg via ORAL
  Filled 2020-11-17 (×2): qty 1

## 2020-11-17 MED ORDER — DOPAMINE-DEXTROSE 3.2-5 MG/ML-% IV SOLN
0.0000 ug/kg/min | INTRAVENOUS | Status: DC
  Administered 2020-11-17: 5 ug/kg/min via INTRAVENOUS
  Filled 2020-11-17: qty 250

## 2020-11-17 NOTE — Progress Notes (Signed)
NAME:  [name] Browning, MRN:  [id number], DOB:  08/22/1940, LOS: 60 ADMISSION DATE:  11/11/2020  80 yo WM admitted 11/2 for SOB, acute sCHF exacerbation, Placed on biPAP. CODE STEMI CALLED 11/5 ST elevations V2,V3 S/p CATH-NO intervention,+demand ischemia and transferred to ICU  History of Present Illness:  [name] Browning is a 80 year old Browning with history of CAD (nonobstructive calcified coronary disease), atrial fibrillation on Eliquis, HFpEF, hypertension, TIA in 2016, COPD on 2 L who was admitted to the hospital with shortness of breath.  Cardiology is consulted for evaluation of an elevated troponin. Since he was discharged on 10/28, and his medications were changed at discharge. His metoprolol XL 25 mg was stopped, though they restarted this because his HR was high at home. His lasix was also stopped. He has been having increased shortness of breath, and increased LE edema. He was having some black colored stools as well.    He is admitted to the hospital 2 times in the last month.   First admission was from 10/20/2020 to 10/31/2020 when he was admitted with respiratory failure due to COPD and pneumonia, as well as an obstructive left renal stone.  He developed hypotension and worsening respiratory distress requiring pressors and BiPAP.    Blood cultures grew positive for Enterococcus faecalis and staph hemolyticus.  He was treated with antibiotics ending 11/03/2020.  The patient was recently admitted to the hospital from 10/25-10/28 with complaints of dysphagia.     He was seen by gastroenterology who underwent an EGD with no significant findings.  Since that time he has had progressive worsening shortness of breath, eventually presented to the emergency department where he was found to have an oxygen saturation of 88% on his home 2 L of oxygen.  His labs are otherwise notable for a troponin of 620, BNP of 220, hemoglobin of 8.2 (9.7 on 11/06/2020).  Chest x-ray shows diffuse bilateral infiltrates which  could be consistent with edema but some overlying scar.   Pertinent  Medical History  COPD Ischemic Cardiomyopathy  Significant Hospital Events: Including procedures, antibiotic start and stop dates in addition to other pertinent events   11/5 acute SOB and DOE on biPAP CODE STEMI 11/5 s/p CATH chronic RCA occlusion, demand ischemia causing MI 11/6 remains in cardiogenic shock, severe hypoxia 11/7 remains on low-dose levophed and continuous heparin infusion 11/8 off pressors this am but restarted after patient given metoprolol, off heparin gtt  Interim History / Subjective:  No acute events overnight.  Sitting up in bed, still with mild nausea no vomiting. Rare central chest discomfort described as "stinging" x2 times today. Denies cough, sob, palpitations, abdominal pain.  Objective   Blood pressure 94/63, pulse 76, temperature 98.2 F (36.8 C), temperature source Oral, resp. rate 19, height 6\' 2"  (1.88 m), weight 87.2 kg, SpO2 96 %. CVP:  [14 mmHg-30 mmHg] 19 mmHg      Intake/Output Summary (Last 24 hours) at 11/17/2020 0746 Last data filed at 11/17/2020 0300 Gross per 24 hour  Intake 350.25 ml  Output 700 ml  Net -349.75 ml    Filed Weights   11/15/20 0500 11/16/20 0500 11/17/20 0500  Weight: 86.4 kg 89.5 kg 87.2 kg     Review of Systems: Review of Systems  Constitutional:  Positive for malaise/fatigue. Negative for chills and fever.  HENT:  Negative for congestion and sore throat.   Respiratory:  Negative for cough, sputum production and shortness of breath.   Cardiovascular:  Positive for leg swelling.  Negative for chest pain, palpitations and orthopnea.  Gastrointestinal:  Positive for nausea. Negative for abdominal pain, blood in stool, constipation, diarrhea and vomiting.  Genitourinary: Negative.   Musculoskeletal: Negative.   Skin: Negative.   Neurological:  Positive for weakness and headaches. Negative for dizziness, tingling and sensory change.   PHYSICAL  EXAMINATION:  GENERAL: comfortable, in no acute distress EYES: Pupils equal, round, reactive to light.  No scleral icterus.  MOUTH: Moist mucosal membrane. NECK: Supple.  PULMONARY:  +crackles bilaterally to lung ascultation no wheezing no sob CARDIOVASCULAR: S1 and S2.  No murmurs  GASTROINTESTINAL: Soft, nontender, non-distended. Positive bowel sounds.  MUSCULOSKELETAL: + edema b/l LE.  NEUROLOGIC: lethargic SKIN: intact,warm,dry  Labs/imaging that I havepersonally reviewed  (right click and "Reselect all SmartList Selections" daily)  CXR 11/6: Stable CHF, mild-mod bibasilar atelectasis worse than CXR 11/5 CXR 11/5: Stable cardiomegaly and diffuse interstitial infiltrates/edema. Stable small loculated left pleural effusion, and right upper lobe atelectasis versus scarring.   ASSESSMENT AND PLAN  80 yo WM admitted 11/2 for SOB, acute sCHF exacerbation, Placed on biPAP. CODE STEMI CALLED 11/5 ST elevations V2,V3 S/p CATH 11/5 -NO intervention,+demand ischemia and transferred to ICU   ACUTE Hypoxic and Hypercapnic Respiratory Failure - stable SEVERE COPD EXACERBATION - stable - Oxygen supplementation prn to keep O2 sats > 88%; BiPAP if needed    - Currently tolerating 2lpm nasal cannula - continue IV steroids as prescribed - continue NEB THERAPY as prescribed  CARDIAC Acute Anterior STEMI - RV infarct with occluded RCA and patent LAD s/p cath 11/5 no intervention Demand ischemia Ischemic Cardiomyopathy Acute on chronic diastolic CHF Echo 78/2: LVEF 60-65%, LV regional wall motion abnormalities, Grade 1 diastolic dysfunction, RV systolic function severely reduced with RV severe enlargement, LA + RA severely dilated - Cardiology recs noted; appreciate input - Continuous Heparin infusion - off today - Restart Eliquis - Lasix as tolerated - follow up cardiac enzymes as indicated  CARDIOGENIC  SHOCK - Use vasopressors to keep MAP>65 as needed; wean as tol - Goal wean off levophed  today - DC Metoprolol - Added midodrine 5mg  TID - Albumin 25% 25mg  IV x1 dose   Anemia, acute on chronic Iron deficiency versus ? Acute blood loss secondary to GI bleed Presented with dark-colored stools and stool occult positive, noted drop in hgb which normalized s/p pRBC transfusion.  - GI following, appreciate input. Plans for further endoscopy on hold given acute cardiac  - Monitor H&H q24h, monitor for any s/s bleeding - Transfuse pRBC for hgb < 7.0 - Protonix 40 IV every 12 hours  ACUTE KIDNEY INJURY/Renal Failure -continue Foley Catheter-assess need -Avoid nephrotoxic agents -Follow urine output, BMP -Ensure adequate renal perfusion, optimize oxygenation -Renal dose medications  Intake/Output Summary (Last 24 hours) at 11/17/2020 0746 Last data filed at 11/17/2020 0300 Gross per 24 hour  Intake 350.25 ml  Output 700 ml  Net -349.75 ml    ENDO - ICU hypoglycemic\Hyperglycemia protocol -check FSBS per protocol  NUTRITIONAL STATUS DIET-->as tolerated Constipation protocol as indicated  ELECTROLYTES -follow labs as needed -replace as needed -pharmacy consultation and following  Best practice (right click and "Reselect all SmartList Selections" daily)  Diet:  as tolerated Glucose control:  SSI Yes Central venous access:  N/A Arterial line:  N/A Foley:  Yes, and it is still needed Mobility:  bed rest  Code Status:  FULL CODE Disposition: ICU transfer to Stepdown, to [facility] tomorrow if remains stable off pressors  Labs   CBC:  Recent Labs  Lab 11/14/20 0945 11/14/20 1342 11/15/20 0401 11/16/20 0510 11/17/20 0514  WBC 19.3* 23.1* 16.1* 12.7* 9.0  NEUTROABS 17.7*  --   --   --   --   HGB 9.3* 10.6* 10.5* 9.4* 8.6*  HCT 30.0* 34.1* 33.4* 30.0* 27.6*  MCV 95.5 92.7 91.0 92.0 91.7  PLT 208 206 234 189 141*     Basic Metabolic Panel: Recent Labs  Lab 11/11/20 1527 11/12/20 0435 11/13/20 0928 11/14/20 0710 11/14/20 0945 11/14/20 1342 11/15/20 0401  11/16/20 0510 11/17/20 0514  NA  --  138   < > 138 140  --  138 135 132*  K  --  4.0   < > 4.1 4.4  --  4.3 4.0 3.8  CL  --  103   < > 99 100  --  102 98 96*  CO2  --  26   < > 27 25  --  27 28 28   GLUCOSE  --  213*   < > 85 84  --  145* 133* 114*  BUN  --  19   < > 33* 35*  --  45* 44* 44*  CREATININE  --  0.95   < > 1.49* 1.93* 1.99* 2.17* 2.01* 1.67*  CALCIUM  --  8.4*   < > 8.3* 8.5*  --  7.9* 8.1* 8.2*  MG 2.0 1.8  --  1.8  --   --  2.5* 2.3  --   PHOS  --   --   --  3.2  --   --  5.1* 4.5 4.1   < > = values in this interval not displayed.    GFR: Estimated Creatinine Clearance: 41.7 mL/min (A) (by C-G formula based on SCr of 1.67 mg/dL (H)). Recent Labs  Lab 11/14/20 1342 11/14/20 1616 11/14/20 1837 11/15/20 0401 11/16/20 0510 11/17/20 0514  WBC 23.1*  --   --  16.1* 12.7* 9.0  LATICACIDVEN 4.1* 3.2* 2.9* 1.9  --   --      Liver Function Tests: Recent Labs  Lab 11/11/20 1340 11/14/20 0710 11/15/20 0401 11/16/20 0510 11/17/20 0514  AST 23 156*  --   --   --   ALT 27 170*  --   --   --   ALKPHOS 85 90  --   --   --   BILITOT 1.3* 1.6*  --   --   --   PROT 5.7* 5.1*  --   --   --   ALBUMIN 2.9* 2.5* 3.0* 3.1* 2.8*    No results for input(s): LIPASE, AMYLASE in the last 168 hours. No results for input(s): AMMONIA in the last 168 hours.  ABG    Component Value Date/Time   PHART 7.40 11/14/2020 2038   PCO2ART 34 11/14/2020 2038   PO2ART 62 (L) 11/14/2020 2038   HCO3 27.9 11/15/2020 1230   ACIDBASEDEF 3.1 (H) 11/14/2020 2038   O2SAT 83.9 11/15/2020 1230      Coagulation Profile: Recent Labs  Lab 11/11/20 1516  INR 1.5*     Cardiac Enzymes: No results for input(s): CKTOTAL, CKMB, CKMBINDEX, TROPONINI in the last 168 hours.  HbA1C: Hgb A1c MFr Bld  Date/Time Value Ref Range Status  11/11/2020 03:38 PM 6.0 (H) 4.8 - 5.6 % Final    Comment:    (NOTE) Pre diabetes:          5.7%-6.4%  Diabetes:              >  6.4%  Glycemic control for    <7.0% adults with diabetes   02/09/2019 06:40 AM 5.5 4.8 - 5.6 % Final    Comment:    (NOTE) Pre diabetes:          5.7%-6.4% Diabetes:              >6.4% Glycemic control for   <7.0% adults with diabetes     CBG: Recent Labs  Lab 11/14/20 0611  GLUCAP 100*     Allergies Allergies  Allergen Reactions   Hydromorphone     Hallucination Pt reports he doesn't know about this        Enid Cutter, PA-S [place] [facility]

## 2020-11-17 NOTE — Consult Note (Signed)
ANTICOAGULATION CONSULT NOTE  Pharmacy Consult for Heparin infusion Indication: chest pain/ACS  Patient Measurements: Height: 6\' 2"  (188 cm) Weight: 87.2 kg (192 lb 3.9 oz) IBW/kg (Calculated) : 82.2 Heparin Dosing Weight: 86.4 kg   Labs: Recent Labs    11/14/20 0945 11/14/20 1342 11/14/20 1342 11/14/20 1837 11/15/20 0016 11/15/20 0127 11/15/20 0401 11/15/20 0914 11/16/20 0510 11/17/20 0514  HGB 9.3* 10.6*  --   --   --   --  10.5*  --  9.4* 8.6*  HCT 30.0* 34.1*  --   --   --   --  33.4*  --  30.0* 27.6*  PLT 208 206  --   --   --   --  234  --  189 141*  APTT 31  --   --   --   --  70*  --   --   --   --   HEPARINUNFRC  --   --    < >  --   --  0.34  --  0.35 0.34 0.30  CREATININE 1.93* 1.99*  --   --   --   --  2.17*  --  2.01*  --   TROPONINIHS 1,581* 5,132*  --  15,951* >24,000*  --  >24,000*  --   --   --    < > = values in this interval not displayed.     Estimated Creatinine Clearance: 34.6 mL/min (A) (by C-G formula based on SCr of 2.01 mg/dL (H)).   Medical History: Past Medical History:  Diagnosis Date   Anginal pain ([place])    Anxiety    Aortic atherosclerosis ([place])    Atrial fibrillation ([place]) 01/2019   Bilateral carpal tunnel syndrome    CAD (coronary artery disease)    a.) LHC 2004 --> normal coronaries. b.) normal stress test in 2007 and 2011; c.) Lexiscan 05/20/2014 --> LVEF 55-65%; no significant stress induced ischemia/arrythmia. d.) CT chest 03/25/2019 --> coronaries carcified.   Carotid atherosclerosis, bilateral    Carpal tunnel syndrome, bilateral    Chronic anticoagulation    a.) ASA + apixaban   COPD (chronic obstructive pulmonary disease) (HCC)    CVA (cerebral vascular accident) ([place])    Degenerative disc disease, cervical    Diastolic dysfunction    a.) TTE 05/29/2014 --> LVEF 60-65%; G1DD.   DVT (deep venous thrombosis) (HCC)    GERD (gastroesophageal reflux disease)    History of 2019 novel coronavirus disease (COVID-19) 02/08/2019    HLD (hyperlipidemia)    Hypertension    Kidney stones    Osteoarthritis of right shoulder    Pneumonia    Respiratory failure, acute ([place]) 09/20/2020   a.) severe respiratory distress 1 hour after urological surgery. CXR (+) for acute pulmonary edema. Transferred to ICU and placed on NIPPV. Questionable aspiration PNA. (+) A.fib with RVR. Improved with ABX, diuresis, and amiodarone.   Skin cancer of face    a.) RIGHT ear and RIGHT forehead; excised.   Syncope    TIA (transient ischemic attack) 2016   Valvular regurgitation    a.) TTE 05/26/2014 --> LVEF 60-65%; trivial MR, mild TR; no AR or PR. b.) TTE 04/20/2015 --> LVEF 55-60%; trivial MR and PR; no AR or TR.    Medications:  Eliquis 5 mg BID -- last dose > 72 hours Heparin 18,000 units total during PCI 11/14/20 between 1100-1200  Assessment: 80 year old male with PMH of Afib/DVT on eliquis PTA initially presented to ED  11/11/20 with SOB and dark stool. Eliquis has been held due to GIB. 11/14/20 patient admitted to ICU for STEMI/cardiogenic shock. S/p emergent PCI. Hbg stabilized. Cleared by GI to initiate systemic anticoagulation. Pharmacy has been consulted for heparin infusion  Goal of Therapy:  Heparin level 0.3-0.7 units/ml aPTT 66-102 seconds Monitor platelets by anticoagulation protocol: Yes  1106 0127 HL 0.34, aPTT 70 therapeutic x 1 1106 0914 HL 0.35, therapeutic x 2 1107 0510 HL 0.34, therapeutic x 3 1108 0514 HL 0.30, therapeutic x $    Plan:  11/8: HL @ 2446 = 0.3 Will continue pt on current rate and recheck HL on 11/9 with AM Labs.   [name] Browning D 11/17/2020 5:54 AM

## 2020-11-17 NOTE — Progress Notes (Signed)
SUBJECTIVE: Patient had bradycardia but feeling fine   Vitals:   11/17/20 0400 11/17/20 0500 11/17/20 0600 11/17/20 0800  BP: (!) 105/59 93/65 94/63    Pulse: 64 69 76   Resp: 16 16 19    Temp: 98.2 F (36.8 C)   98.1 F (36.7 C)  TempSrc: Oral   Oral  SpO2: 97% 96% 96%   Weight:  87.2 kg    Height:        Intake/Output Summary (Last 24 hours) at 11/17/2020 0946 Last data filed at 11/17/2020 0300 Gross per 24 hour  Intake 350.25 ml  Output 700 ml  Net -349.75 ml    LABS: Basic Metabolic Panel: Recent Labs    11/15/20 0401 11/16/20 0510 11/17/20 0514  NA 138 135 132*  K 4.3 4.0 3.8  CL 102 98 96*  CO2 27 28 28   GLUCOSE 145* 133* 114*  BUN 45* 44* 44*  CREATININE 2.17* 2.01* 1.67*  CALCIUM 7.9* 8.1* 8.2*  MG 2.5* 2.3  --   PHOS 5.1* 4.5 4.1   Liver Function Tests: Recent Labs    11/16/20 0510 11/17/20 0514  ALBUMIN 3.1* 2.8*   No results for input(s): LIPASE, AMYLASE in the last 72 hours. CBC: Recent Labs    11/16/20 0510 11/17/20 0514  WBC 12.7* 9.0  HGB 9.4* 8.6*  HCT 30.0* 27.6*  MCV 92.0 91.7  PLT 189 141*   Cardiac Enzymes: No results for input(s): CKTOTAL, CKMB, CKMBINDEX, TROPONINI in the last 72 hours. BNP: Invalid input(s): POCBNP D-Dimer: No results for input(s): DDIMER in the last 72 hours. Hemoglobin A1C: No results for input(s): HGBA1C in the last 72 hours. Fasting Lipid Panel: No results for input(s): CHOL, HDL, LDLCALC, TRIG, CHOLHDL, LDLDIRECT in the last 72 hours. Thyroid Function Tests: No results for input(s): TSH, T4TOTAL, T3FREE, THYROIDAB in the last 72 hours.  Invalid input(s): FREET3 Anemia Panel: Recent Labs    11/14/20 1616  VITAMINB12 1,193*  FOLATE 22.3  FERRITIN >7,500*  TIBC 203*  IRON 35*     PHYSICAL EXAM General: Well developed, well nourished, in no acute distress HEENT:  Normocephalic and atramatic Neck:  No JVD.  Lungs: Clear bilaterally to auscultation and percussion. Heart: HRRR . Normal S1  and S2 without gallops or murmurs.  Abdomen: Bowel sounds are positive, abdomen soft and non-tender  Msk:  Back normal, normal gait. Normal strength and tone for age. Extremities: No clubbing, cyanosis or edema.   Neuro: Alert and oriented X 3. Psych:  Good affect, responds appropriately  TELEMETRY: Sinus bradycardia  ASSESSMENT AND PLAN: Status post RV infarct with hypotensive episode.  LAD was patent but RCA was occluded with collaterals to the LAD.  Continue monitoring the patient with intermittently requiring pressors.  Patient's LV ejection fraction was 55 to 60% but RV was dilated and hypokinetic.  Patient is gradually improving.  Principal Problem:   Acute on chronic diastolic CHF (congestive heart failure) (HCC) Active Problems:   HTN (hypertension)   COPD, moderate (HCC)   Acute on chronic respiratory failure with hypoxia (HCC)   Atrial fibrillation (HCC)   DVT (deep venous thrombosis) (HCC)   HLD (hyperlipidemia)   Stroke (HCC)   CAD (coronary artery disease)   NSTEMI (non-ST elevated myocardial infarction) (HCC)   Hypokalemia   Thrombocytopenia (HCC)   Iron deficiency anemia   GI bleeding   Acute ST elevation myocardial infarction (STEMI) of anterior wall (HCC)   Cardiogenic shock ([place])   Hemorrhagic shock ([place])  Dionisio David, MD, [facility] 11/17/2020 9:46 AM

## 2020-11-18 ENCOUNTER — Inpatient Hospital Stay: Payer: Medicare HMO

## 2020-11-18 DIAGNOSIS — I5033 Acute on chronic diastolic (congestive) heart failure: Secondary | ICD-10-CM | POA: Diagnosis not present

## 2020-11-18 LAB — BASIC METABOLIC PANEL
Anion gap: 9 (ref 5–15)
BUN: 56 mg/dL — ABNORMAL HIGH (ref 8–23)
CO2: 28 mmol/L (ref 22–32)
Calcium: 8.6 mg/dL — ABNORMAL LOW (ref 8.9–10.3)
Chloride: 96 mmol/L — ABNORMAL LOW (ref 98–111)
Creatinine, Ser: 2.28 mg/dL — ABNORMAL HIGH (ref 0.61–1.24)
GFR, Estimated: 28 mL/min — ABNORMAL LOW (ref >60)
Glucose, Bld: 120 mg/dL — ABNORMAL HIGH (ref 70–99)
Potassium: 4.1 mmol/L (ref 3.5–5.1)
Sodium: 133 mmol/L — ABNORMAL LOW (ref 135–145)

## 2020-11-18 LAB — BPAM RBC
Blood Product Expiration Date: 202212132359
ISSUE DATE / TIME: 202211081718
Unit Type and Rh: 5100

## 2020-11-18 LAB — TYPE AND SCREEN
ABO/RH(D): O POS
Antibody Screen: NEGATIVE
Unit division: 0

## 2020-11-18 LAB — CBC
HCT: 30.6 % — ABNORMAL LOW (ref 39.0–52.0)
Hemoglobin: 9.9 g/dL — ABNORMAL LOW (ref 13.0–17.0)
MCH: 28.9 pg (ref 26.0–34.0)
MCHC: 32.4 g/dL (ref 30.0–36.0)
MCV: 89.5 fL (ref 80.0–100.0)
Platelets: 140 10*3/uL — ABNORMAL LOW (ref 150–400)
RBC: 3.42 MIL/uL — ABNORMAL LOW (ref 4.22–5.81)
RDW: 15.9 % — ABNORMAL HIGH (ref 11.5–15.5)
WBC: 8.6 10*3/uL (ref 4.0–10.5)
nRBC: 1.5 % — ABNORMAL HIGH (ref 0.0–0.2)

## 2020-11-18 MED ORDER — LACTATED RINGERS IV SOLN: INTRAVENOUS | Status: AC

## 2020-11-18 MED ORDER — PROSOURCE PLUS PO LIQD
30.0000 mL | Freq: Three times a day (TID) | ORAL | Status: DC
  Administered 2020-11-19 – 2020-11-28 (×17): 30 mL via ORAL
  Filled 2020-11-18 (×34): qty 30

## 2020-11-18 MED ORDER — SODIUM CHLORIDE 0.9 % IV SOLN
12.5000 mg | Freq: Three times a day (TID) | INTRAVENOUS | Status: DC
  Administered 2020-11-18 – 2020-11-19 (×2): 12.5 mg via INTRAVENOUS
  Filled 2020-11-18 (×2): qty 12.5
  Filled 2020-11-18 (×2): qty 0.5

## 2020-11-18 MED ORDER — BOOST / RESOURCE BREEZE PO LIQD CUSTOM
1.0000 | Freq: Three times a day (TID) | ORAL | Status: DC
  Administered 2020-11-19 – 2020-11-29 (×20): 1 via ORAL

## 2020-11-18 MED ORDER — MIDODRINE HCL 5 MG PO TABS
10.0000 mg | ORAL_TABLET | Freq: Three times a day (TID) | ORAL | Status: DC
  Administered 2020-11-18 – 2020-11-23 (×16): 10 mg via ORAL
  Filled 2020-11-18 (×16): qty 2

## 2020-11-18 NOTE — Progress Notes (Signed)
NAME:  [name] Browning, MRN:  [id number], DOB:  May 22, 1940, LOS: 7 ADMISSION DATE:  11/11/2020  80 yo WM admitted 11/2 for SOB, acute sCHF exacerbation, Placed on biPAP. CODE STEMI CALLED 11/5 ST elevations V2,V3 S/p CATH-NO intervention,+demand ischemia and transferred to ICU  History of Present Illness:  [name] Browning is a 80 year old male with history of CAD (nonobstructive calcified coronary disease), atrial fibrillation on Eliquis, HFpEF, hypertension, TIA in 2016, COPD on 2 L who was admitted to the hospital with shortness of breath.  Cardiology is consulted for evaluation of an elevated troponin. Since he was discharged on 10/28, and his medications were changed at discharge. His metoprolol XL 25 mg was stopped, though they restarted this because his HR was high at home. His lasix was also stopped. He has been having increased shortness of breath, and increased LE edema. He was having some black colored stools as well.    He is admitted to the hospital 2 times in the last month.   First admission was from 10/20/2020 to 10/31/2020 when he was admitted with respiratory failure due to COPD and pneumonia, as well as an obstructive left renal stone.  He developed hypotension and worsening respiratory distress requiring pressors and BiPAP.    Blood cultures grew positive for Enterococcus faecalis and staph hemolyticus.  He was treated with antibiotics ending 11/03/2020.  The patient was recently admitted to the hospital from 10/25-10/28 with complaints of dysphagia.     He was seen by gastroenterology who underwent an EGD with no significant findings.  Since that time he has had progressive worsening shortness of breath, eventually presented to the emergency department where he was found to have an oxygen saturation of 88% on his home 2 L of oxygen.  His labs are otherwise notable for a troponin of 620, BNP of 220, hemoglobin of 8.2 (9.7 on 11/06/2020).  Chest x-ray shows diffuse bilateral infiltrates which  could be consistent with edema but some overlying scar.   Pertinent  Medical History  COPD Ischemic Cardiomyopathy  Significant Hospital Events: Including procedures, antibiotic start and stop dates in addition to other pertinent events   11/5 acute SOB and DOE on biPAP CODE STEMI 11/5 s/p CATH chronic RCA occlusion, demand ischemia causing MI 11/6 remains in cardiogenic shock, severe hypoxia 11/7 remains on low-dose levophed and continuous heparin infusion 11/8 off pressors this am but restarted (levophed switched to dopamine) after patient became hypotensive after given metoprolol, off heparin gtt 11/9  Interim History / Subjective:  No acute events overnight.    Objective   Blood pressure 124/67, pulse 63, temperature 98 F (36.7 C), resp. rate (!) 21, height 6\' 2"  (1.88 m), weight 87.2 kg, SpO2 94 %. CVP:  [2 mmHg-45 mmHg] 23 mmHg      Intake/Output Summary (Last 24 hours) at 11/18/2020 0736 Last data filed at 11/18/2020 0230 Gross per 24 hour  Intake 591.71 ml  Output 1050 ml  Net -458.29 ml    Filed Weights   11/15/20 0500 11/16/20 0500 11/17/20 0500  Weight: 86.4 kg 89.5 kg 87.2 kg     Review of Systems: Review of Systems  Constitutional:  Positive for malaise/fatigue. Negative for chills and fever.  HENT:  Negative for congestion and sore throat.   Respiratory:  Negative for cough, sputum production and shortness of breath.   Cardiovascular:  Negative for chest pain, palpitations, orthopnea and leg swelling.  Gastrointestinal:  Positive for nausea. Negative for abdominal pain, blood in stool, constipation,  diarrhea and vomiting.       Decreased appetite  Genitourinary: Negative.   Musculoskeletal: Negative.   Skin: Negative.   Neurological:  Positive for weakness. Negative for dizziness, tingling, sensory change and headaches.   PHYSICAL EXAMINATION:  GENERAL: comfortable, in no acute distress EYES: Pupils equal, round, reactive to light.  No scleral  icterus.  MOUTH: Moist mucosal membrane. NECK: Supple.  PULMONARY:  +clear bilaterally to lung ascultation no wheezing no sob CARDIOVASCULAR: S1 and S2.  No murmurs  GASTROINTESTINAL: Soft, nontender, non-distended. Positive bowel sounds.  MUSCULOSKELETAL: + edema b/l LE, improved since 11/8.  NEUROLOGIC: alert, awake, moving all extremities, following commands SKIN: intact,warm,dry  Labs/imaging that I havepersonally reviewed  (right click and "Reselect all SmartList Selections" daily)  CXR 11/6: Stable CHF, mild-mod bibasilar atelectasis worse than CXR 11/5 CXR 11/5: Stable cardiomegaly and diffuse interstitial infiltrates/edema. Stable small loculated left pleural effusion, and right upper lobe atelectasis versus scarring.   ASSESSMENT AND PLAN  80 yo WM admitted 11/2 for SOB, acute sCHF exacerbation, Placed on biPAP. CODE STEMI CALLED 11/5 ST elevations V2,V3 S/p CATH 11/5 -NO intervention,+demand ischemia and transferred to ICU   ACUTE Hypoxic and Hypercapnic Respiratory Failure - stable SEVERE COPD EXACERBATION - stable - Oxygen supplementation prn to keep O2 sats > 88%; BiPAP if needed    - Currently tolerating 2lpm nasal cannula - continue IV steroids as prescribed - continue NEB THERAPY as prescribed  CARDIAC Acute Anterior STEMI - RV infarct with occluded RCA and patent LAD s/p cath 11/5 no intervention Demand ischemia Ischemic Cardiomyopathy Acute on chronic diastolic CHF Echo 37/1: LVEF 60-65%, LV regional wall motion abnormalities, Grade 1 diastolic dysfunction, RV systolic function severely reduced with RV severe enlargement, LA + RA severely dilated - Cardiology recs noted; appreciate input - Eliquis restarted 11/8 - Lasix as tolerated - follow up cardiac enzymes as indicated  CARDIOGENIC  SHOCK-resolved - Use vasopressors to keep MAP>65 as needed; wean as tol  - Levophed switched to dopamine 11/8; wean dopamine as tol to DC - Midodrine 5mg  TID - increased to  10mg  TID 11/9  Anemia, acute on chronic Iron deficiency versus ? Acute blood loss secondary to GI bleed Presented with dark-colored stools and stool occult positive, noted drop in hgb which normalized s/p pRBC transfusion.  - GI following, appreciate input. Plans for further endoscopy on hold given acute cardiac  - Monitor H&H q24h, monitor for any s/s bleeding - Transfuse pRBC for hgb < 7.0    - s/p 1uPRBC 11/8 - Protonix 40 IV every 12 hours  ACUTE KIDNEY INJURY/Renal Failure -continue Foley Catheter-assess need -Avoid nephrotoxic agents -Follow urine output, BMP -Ensure adequate renal perfusion, optimize oxygenation -Renal dose medications  Intake/Output Summary (Last 24 hours) at 11/18/2020 0736 Last data filed at 11/18/2020 0230 Gross per 24 hour  Intake 591.71 ml  Output 1050 ml  Net -458.29 ml    ENDO - ICU hypoglycemic\Hyperglycemia protocol -check FSBS per protocol  NUTRITIONAL STATUS Nausea with decreased appetite DIET-->as tolerated KUB today Continue Ensure supplements  Antiemetics prn Constipation protocol as indicated PT/OT eval and treat; OOB as tol  ELECTROLYTES -follow labs as needed -replace as needed -pharmacy consultation and following  Best practice (right click and "Reselect all SmartList Selections" daily)  Diet:  as tolerated Glucose control:  SSI Yes Central venous access:  N/A Arterial line:  N/A Foley:  Yes, and it is still needed Mobility:  OOB  Code Status:  FULL CODE Disposition: Stepdown  Labs   CBC: Recent Labs  Lab 11/14/20 0945 11/14/20 1342 11/15/20 0401 11/16/20 0510 11/17/20 0514 11/17/20 2315 11/18/20 0506  WBC 19.3*   < > 16.1* 12.7* 9.0 9.4 8.6  NEUTROABS 17.7*  --   --   --   --   --   --   HGB 9.3*   < > 10.5* 9.4* 8.6* 9.9* 9.9*  HCT 30.0*   < > 33.4* 30.0* 27.6* 30.9* 30.6*  MCV 95.5   < > 91.0 92.0 91.7 91.7 89.5  PLT 208   < > 234 189 141* 133* 140*   < > = values in this interval not displayed.    Basic Metabolic Panel: Recent Labs  Lab 11/11/20 1527 11/12/20 0435 11/13/20 0928 11/14/20 0710 11/14/20 0945 11/14/20 1342 11/15/20 0401 11/16/20 0510 11/17/20 0514 11/18/20 0506  NA  --  138   < > 138 140  --  138 135 132* 133*  K  --  4.0   < > 4.1 4.4  --  4.3 4.0 3.8 4.1  CL  --  103   < > 99 100  --  102 98 96* 96*  CO2  --  26   < > 27 25  --  27 28 28 28   GLUCOSE  --  213*   < > 85 84  --  145* 133* 114* 120*  BUN  --  19   < > 33* 35*  --  45* 44* 44* 56*  CREATININE  --  0.95   < > 1.49* 1.93* 1.99* 2.17* 2.01* 1.67* 2.28*  CALCIUM  --  8.4*   < > 8.3* 8.5*  --  7.9* 8.1* 8.2* 8.6*  MG 2.0 1.8  --  1.8  --   --  2.5* 2.3  --   --   PHOS  --   --   --  3.2  --   --  5.1* 4.5 4.1  --    < > = values in this interval not displayed.   GFR: Estimated Creatinine Clearance: 30.5 mL/min (A) (by C-G formula based on SCr of 2.28 mg/dL (H)). Recent Labs  Lab 11/14/20 1342 11/14/20 1616 11/14/20 1837 11/15/20 0401 11/16/20 0510 11/17/20 0514 11/17/20 2315 11/18/20 0506  WBC 23.1*  --   --  16.1* 12.7* 9.0 9.4 8.6  LATICACIDVEN 4.1* 3.2* 2.9* 1.9  --   --   --   --    Liver Function Tests: Recent Labs  Lab 11/11/20 1340 11/14/20 0710 11/15/20 0401 11/16/20 0510 11/17/20 0514  AST 23 156*  --   --   --   ALT 27 170*  --   --   --   ALKPHOS 85 90  --   --   --   BILITOT 1.3* 1.6*  --   --   --   PROT 5.7* 5.1*  --   --   --   ALBUMIN 2.9* 2.5* 3.0* 3.1* 2.8*    No results for input(s): LIPASE, AMYLASE in the last 168 hours. No results for input(s): AMMONIA in the last 168 hours.  ABG    Component Value Date/Time   PHART 7.40 11/14/2020 2038   PCO2ART 34 11/14/2020 2038   PO2ART 62 (L) 11/14/2020 2038   HCO3 27.9 11/15/2020 1230   ACIDBASEDEF 3.1 (H) 11/14/2020 2038   O2SAT 83.9 11/15/2020 1230      Coagulation Profile: Recent Labs  Lab 11/11/20 1516  INR 1.5*  Cardiac Enzymes: No results for input(s): CKTOTAL, CKMB, CKMBINDEX,  TROPONINI in the last 168 hours.  HbA1C: Hgb A1c MFr Bld  Date/Time Value Ref Range Status  11/11/2020 03:38 PM 6.0 (H) 4.8 - 5.6 % Final    Comment:    (NOTE) Pre diabetes:          5.7%-6.4%  Diabetes:              >6.4%  Glycemic control for   <7.0% adults with diabetes   02/09/2019 06:40 AM 5.5 4.8 - 5.6 % Final    Comment:    (NOTE) Pre diabetes:          5.7%-6.4% Diabetes:              >6.4% Glycemic control for   <7.0% adults with diabetes     Will sign off at this time. Please call with any questions  Corrin Parker, M.D.  [facility]  Medical Director [place] Director [facility] Cardio-Pulmonary Department

## 2020-11-18 NOTE — Progress Notes (Signed)
PROGRESS NOTE    [name] Browning  WUJ:[id number] DOB: [birth year] DOA: 11/11/2020 PCP: [place]  [id number]   Assessment & Plan:   Principal Problem:   Acute on chronic diastolic CHF (congestive heart failure) (HCC) Active Problems:   HTN (hypertension)   COPD, moderate (HCC)   Acute on chronic respiratory failure with hypoxia (HCC)   Atrial fibrillation (HCC)   DVT (deep venous thrombosis) (HCC)   HLD (hyperlipidemia)   Stroke (HCC)   CAD (coronary artery disease)   NSTEMI (non-ST elevated myocardial infarction) (HCC)   Hypokalemia   Thrombocytopenia (HCC)   Iron deficiency anemia   GI bleeding   Acute ST elevation myocardial infarction (STEMI) of anterior wall (HCC)   Cardiogenic shock ([place])   Hemorrhagic shock [facility])   [name] Browning is a 80 year old male with history of CAD (nonobstructive calcified coronary disease), atrial fibrillation on Eliquis, HFpEF, hypertension, TIA in 2016, COPD on 2 L who was admitted to the hospital on 11/2 with shortness of breath.  He is admitted to the hospital 2 times in the last month.   First admission was from 10/20/2020 to 10/31/2020 when he was admitted with respiratory failure due to COPD and pneumonia, as well as an obstructive left renal stone.  He developed hypotension and worsening respiratory distress requiring pressors and BiPAP.     Blood cultures grew positive for Enterococcus faecalis and staph hemolyticus.  He was treated with antibiotics ending 11/03/2020.  The patient was recently admitted to the hospital from 10/25-10/28 with complaints of dysphagia.     He was seen by gastroenterology who underwent an EGD with no significant findings.  Since that time he has had progressive worsening shortness of breath, eventually presented to the emergency department where he was found to have an oxygen saturation of 88% on his home 2 L of oxygen.    ACUTE on chronic hypoxic respiratory failure COPD on 2L at baseline --Continue  supplemental O2 to keep sats between 88-92%   Acute Anterior STEMI  Hx of Ischemic Cardiomyopathy - cardiac catheterization showed no significant obstruction in the LAD with occluded RCA and collaterals from LAD to RCA.  It appeared that RCA was chronically occluded.  --s/p heparin gtt Plan: --cont ASA and statin --hold beta blocker due to hypotension  Acute on chronic diastolic CHF Echo 95/6: LVEF 60-65%, LV regional wall motion abnormalities, Grade 1 diastolic dysfunction, RV systolic function severely reduced with RV severe enlargement, LA + RA severely dilated --hold beta blocker due to hypotension   CARDIOGENIC  SHOCK-resolved Hypotension --weaned off Levophed and dopamine as of morning of 11/9 --cont midodrine 10 mg TID   Anemia, acute on chronic Iron deficiency versus ? Acute blood loss secondary to GI bleed Presented with dark-colored stools and stool occult positive, noted drop in hgb which normalized s/p pRBC transfusion.  - GI following --s/p 2u pRBC Plan: --no plan for endoscopic procedures at this time, per GI - Protonix 40 IV every 12 hours   ACUTE KIDNEY INJURY --LR@100  for 10 hours --d/c Foley today    DVT prophylaxis: [id number] Code Status: Full code  Family Communication:  Level of care: stepdown Dispo:   The patient is from: home Anticipated d/c is to: undetermined Anticipated d/c date is: 2-3 days Patient currently is not medically ready to d/c due to: hypotension, AKI not improved   Subjective and Interval History:  Pt reported nausea and poor oral intake.  No abdominal pain.     Objective: Vitals:  11/18/20 1645 11/18/20 1700 11/18/20 1715 11/18/20 1730  BP: 109/63 (!) 89/55 (!) 106/59 95/60  Pulse: 71 (!) 49 60 76  Resp: (!) 23 (!) 23 (!) 25 (!) 21  Temp:      TempSrc:      SpO2: 93% 94% 94% 95%  Weight:      Height:        Intake/Output Summary (Last 24 hours) at 11/18/2020 1812 Last data filed at 11/18/2020 1143 Gross per 24 hour   Intake 448.67 ml  Output 625 ml  Net -176.33 ml   Filed Weights   11/15/20 0500 11/16/20 0500 11/17/20 0500  Weight: 86.4 kg 89.5 kg 87.2 kg    Examination:   Constitutional: NAD, AAOx3 HEENT: conjunctivae and lids normal, EOMI CV: No cyanosis.   RESP: normal respiratory effort, crackles at bases, on 2L Extremities: trace pitting edema in BLE SKIN: warm, dry Neuro: II - XII grossly intact.   Psych: Normal mood and affect.  Appropriate judgement and reason   Data Reviewed: I have personally reviewed following labs and imaging studies  CBC: Recent Labs  Lab 11/14/20 0945 11/14/20 1342 11/15/20 0401 11/16/20 0510 11/17/20 0514 11/17/20 2315 11/18/20 0506  WBC 19.3*   < > 16.1* 12.7* 9.0 9.4 8.6  NEUTROABS 17.7*  --   --   --   --   --   --   HGB 9.3*   < > 10.5* 9.4* 8.6* 9.9* 9.9*  HCT 30.0*   < > 33.4* 30.0* 27.6* 30.9* 30.6*  MCV 95.5   < > 91.0 92.0 91.7 91.7 89.5  PLT 208   < > 234 189 141* 133* 140*   < > = values in this interval not displayed.   Basic Metabolic Panel: Recent Labs  Lab 11/12/20 0435 11/13/20 0928 11/14/20 0710 11/14/20 0945 11/14/20 1342 11/15/20 0401 11/16/20 0510 11/17/20 0514 11/18/20 0506  NA 138   < > 138 140  --  138 135 132* 133*  K 4.0   < > 4.1 4.4  --  4.3 4.0 3.8 4.1  CL 103   < > 99 100  --  102 98 96* 96*  CO2 26   < > 27 25  --  27 28 28 28   GLUCOSE 213*   < > 85 84  --  145* 133* 114* 120*  BUN 19   < > 33* 35*  --  45* 44* 44* 56*  CREATININE 0.95   < > 1.49* 1.93* 1.99* 2.17* 2.01* 1.67* 2.28*  CALCIUM 8.4*   < > 8.3* 8.5*  --  7.9* 8.1* 8.2* 8.6*  MG 1.8  --  1.8  --   --  2.5* 2.3  --   --   PHOS  --   --  3.2  --   --  5.1* 4.5 4.1  --    < > = values in this interval not displayed.   GFR: Estimated Creatinine Clearance: 30.5 mL/min (A) (by C-G formula based on SCr of 2.28 mg/dL (H)). Liver Function Tests: Recent Labs  Lab 11/14/20 0710 11/15/20 0401 11/16/20 0510 11/17/20 0514  AST 156*  --   --   --    ALT 170*  --   --   --   ALKPHOS 90  --   --   --   BILITOT 1.6*  --   --   --   PROT 5.1*  --   --   --  ALBUMIN 2.5* 3.0* 3.1* 2.8*   No results for input(s): LIPASE, AMYLASE in the last 168 hours. No results for input(s): AMMONIA in the last 168 hours. Coagulation Profile: No results for input(s): INR, PROTIME in the last 168 hours. Cardiac Enzymes: No results for input(s): CKTOTAL, CKMB, CKMBINDEX, TROPONINI in the last 168 hours. BNP (last 3 results) No results for input(s): PROBNP in the last 8760 hours. HbA1C: No results for input(s): HGBA1C in the last 72 hours. CBG: Recent Labs  Lab 11/14/20 0611  GLUCAP 100*   Lipid Profile: No results for input(s): CHOL, HDL, LDLCALC, TRIG, CHOLHDL, LDLDIRECT in the last 72 hours. Thyroid Function Tests: No results for input(s): TSH, T4TOTAL, FREET4, T3FREE, THYROIDAB in the last 72 hours. Anemia Panel: No results for input(s): VITAMINB12, FOLATE, FERRITIN, TIBC, IRON, RETICCTPCT in the last 72 hours. Sepsis Labs: Recent Labs  Lab 11/14/20 1342 11/14/20 1616 11/14/20 1837 11/15/20 0401  LATICACIDVEN 4.1* 3.2* 2.9* 1.9    Recent Results (from the past 240 hour(s))  Resp Panel by RT-PCR (Flu A&B, Covid) Nasopharyngeal Swab     Status: None   Collection Time: 11/11/20  1:40 PM   Specimen: Nasopharyngeal Swab; Nasopharyngeal(NP) swabs in vial transport medium  Result Value Ref Range Status   SARS Coronavirus 2 by RT PCR NEGATIVE NEGATIVE Final    Comment: (NOTE) SARS-CoV-2 target nucleic acids are NOT DETECTED.  The SARS-CoV-2 RNA is generally detectable in upper respiratory specimens during the acute phase of infection. The lowest concentration of SARS-CoV-2 viral copies this assay can detect is 138 copies/mL. A negative result does not preclude SARS-Cov-2 infection and should not be used as the sole basis for treatment or other patient management decisions. A negative result may occur with  improper specimen  collection/handling, submission of specimen other than nasopharyngeal swab, presence of viral mutation(s) within the areas targeted by this assay, and inadequate number of viral copies(<138 copies/mL). A negative result must be combined with clinical observations, patient history, and epidemiological information. The expected result is Negative.  Fact Sheet for Patients:  EntrepreneurPulse.com.au  Fact Sheet for Healthcare Providers:  IncredibleEmployment.be  This test is no t yet approved or cleared by the [place] FDA and  has been authorized for detection and/or diagnosis of SARS-CoV-2 by FDA under an Emergency Use Authorization (EUA). This EUA will remain  in effect (meaning this test can be used) for the duration of the COVID-19 declaration under Section 564(b)(1) of the Act, 21 U.S.C.section 360bbb-3(b)(1), unless the authorization is terminated  or revoked sooner.       Influenza A by PCR NEGATIVE NEGATIVE Final   Influenza B by PCR NEGATIVE NEGATIVE Final    Comment: (NOTE) The Xpert Xpress SARS-CoV-2/FLU/RSV plus assay is intended as an aid in the diagnosis of influenza from Nasopharyngeal swab specimens and should not be used as a sole basis for treatment. Nasal washings and aspirates are unacceptable for Xpert Xpress SARS-CoV-2/FLU/RSV testing.  Fact Sheet for Patients: EntrepreneurPulse.com.au  Fact Sheet for Healthcare Providers: IncredibleEmployment.be  This test is not yet approved or cleared by the [place] FDA and has been authorized for detection and/or diagnosis of SARS-CoV-2 by FDA under an Emergency Use Authorization (EUA). This EUA will remain in effect (meaning this test can be used) for the duration of the COVID-19 declaration under Section 564(b)(1) of the Act, 21 U.S.C. section 360bbb-3(b)(1), unless the authorization is terminated or revoked.  Performed at [facility], [address] [place], [place] [address]  Radiology Studies: DG Abd 1 View  Result Date: 11/18/2020 CLINICAL DATA:  Nausea and vomiting. EXAM: ABDOMEN - 1 VIEW COMPARISON:  10/20/2020. FINDINGS: Air is seen in the stomach. Gas in nondistended small bowel and colon. Gas in the rectum. Possible left renal stone. Coarsened basilar pulmonary markings. IMPRESSION: 1. No acute findings. 2. Possible left renal stone. 3. Coarsened basilar pulmonary markings. Electronically Signed   By: Lorin Picket M.D.   On: 11/18/2020 10:35     Scheduled Meds:  (feeding supplement) PROSource Plus  30 mL Oral TID BM   sodium chloride   Intravenous Once   apixaban  5 mg Oral BID   aspirin EC  81 mg Oral Daily   atorvastatin  40 mg Oral QPC supper   Chlorhexidine Gluconate Cloth  6 each Topical Daily   feeding supplement  1 Container Oral TID BM   ferrous gluconate  324 mg Oral QPC supper   ipratropium-albuterol  3 mL Nebulization Q6H   methocarbamol  500 mg Oral TID   midodrine  10 mg Oral TID WC   mometasone-formoterol  2 puff Inhalation BID   multivitamin with minerals  1 tablet Oral Daily   pantoprazole (PROTONIX) IV  40 mg Intravenous Q12H   sodium chloride flush  10-40 mL Intracatheter Q12H   sodium chloride flush  3 mL Intravenous Q12H   tamsulosin  0.4 mg Oral Daily   Continuous Infusions:  sodium chloride     sodium chloride     DOPamine Stopped (11/18/20 1040)   lactated ringers 100 mL/hr at 11/18/20 1219   norepinephrine (LEVOPHED) Adult infusion Stopped (11/17/20 2319)   promethazine (PHENERGAN) injection (IM or IVPB) Stopped (11/15/20 0508)   promethazine (PHENERGAN) injection (IM or IVPB) 12.5 mg (11/18/20 1623)     LOS: 7 days     Enzo Bi, MD [facility] If 7PM-7AM, please contact night-coverage 11/18/2020, 6:12 PM

## 2020-11-18 NOTE — Progress Notes (Signed)
SUBJECTIVE: Patient is feeling better today with no chest pain or shortness of breath   Vitals:   11/18/20 0700 11/18/20 0723 11/18/20 0800 11/18/20 0900  BP: 111/64  113/69 121/67  Pulse: (!) 54  64 (!) 58  Resp: 17  18 15   Temp:      TempSrc:      SpO2: 97% 94% 96% 96%  Weight:      Height:        Intake/Output Summary (Last 24 hours) at 11/18/2020 0915 Last data filed at 11/18/2020 0900 Gross per 24 hour  Intake 591.71 ml  Output 1275 ml  Net -683.29 ml    LABS: Basic Metabolic Panel: Recent Labs    11/16/20 0510 11/17/20 0514 11/18/20 0506  NA 135 132* 133*  K 4.0 3.8 4.1  CL 98 96* 96*  CO2 28 28 28   GLUCOSE 133* 114* 120*  BUN 44* 44* 56*  CREATININE 2.01* 1.67* 2.28*  CALCIUM 8.1* 8.2* 8.6*  MG 2.3  --   --   PHOS 4.5 4.1  --    Liver Function Tests: Recent Labs    11/16/20 0510 11/17/20 0514  ALBUMIN 3.1* 2.8*   No results for input(s): LIPASE, AMYLASE in the last 72 hours. CBC: Recent Labs    11/17/20 2315 11/18/20 0506  WBC 9.4 8.6  HGB 9.9* 9.9*  HCT 30.9* 30.6*  MCV 91.7 89.5  PLT 133* 140*   Cardiac Enzymes: No results for input(s): CKTOTAL, CKMB, CKMBINDEX, TROPONINI in the last 72 hours. BNP: Invalid input(s): POCBNP D-Dimer: No results for input(s): DDIMER in the last 72 hours. Hemoglobin A1C: No results for input(s): HGBA1C in the last 72 hours. Fasting Lipid Panel: No results for input(s): CHOL, HDL, LDLCALC, TRIG, CHOLHDL, LDLDIRECT in the last 72 hours. Thyroid Function Tests: No results for input(s): TSH, T4TOTAL, T3FREE, THYROIDAB in the last 72 hours.  Invalid input(s): FREET3 Anemia Panel: No results for input(s): VITAMINB12, FOLATE, FERRITIN, TIBC, IRON, RETICCTPCT in the last 72 hours.   PHYSICAL EXAM General: Well developed, well nourished, in no acute distress HEENT:  Normocephalic and atramatic Neck:  No JVD.  Lungs: Clear bilaterally to auscultation and percussion. Heart: HRRR . Normal S1 and S2 without  gallops or murmurs.  Abdomen: Bowel sounds are positive, abdomen soft and non-tender  Msk:  Back normal, normal gait. Normal strength and tone for age. Extremities: No clubbing, cyanosis or edema.   Neuro: Alert and oriented X 3. Psych:  Good affect, responds appropriately  TELEMETRY: Sinus rhythm 60 bpm  ASSESSMENT AND PLAN: Status post ST elevation in the anterior leads with cardiac catheterization revealing no significant obstruction in the LAD with occluded RCA and collaterals from LAD to RCA.  It appeared that RCA was chronically occluded.  Patient has remained hypotensive because of possible RV infarct with troponin as high as 15,000.  Patient is no longer on pressors but blood pressure is around 24-40 systolic with heart rate 60.  Patient is improving gradually.  Continue medical therapy including aspirin and IV heparin.  He had questionable GI bleed and is waiting for possible colonoscopy by gastroenterologist.  Principal Problem:   Acute on chronic diastolic CHF (congestive heart failure) (HCC) Active Problems:   HTN (hypertension)   COPD, moderate (HCC)   Acute on chronic respiratory failure with hypoxia (HCC)   Atrial fibrillation (HCC)   DVT (deep venous thrombosis) (HCC)   HLD (hyperlipidemia)   Stroke ([place])   CAD (coronary artery disease)   NSTEMI (  non-ST elevated myocardial infarction) (HCC)   Hypokalemia   Thrombocytopenia (HCC)   Iron deficiency anemia   GI bleeding   Acute ST elevation myocardial infarction (STEMI) of anterior wall (HCC)   Cardiogenic shock ([place])   Hemorrhagic shock (HCC)    Marshon Bangs A, MD, [facility] 11/18/2020 9:15 AM

## 2020-11-18 NOTE — Progress Notes (Signed)
Initial Nutrition Assessment  DOCUMENTATION CODES:   Not applicable  INTERVENTION:   -MVI with minerals daily -D/c Ensure Enlive -Boost Breeze po TID, each supplement provides 250 kcal and 9 grams of protein  -30 ml Prosource Plus TID, each supplement provides 100 kcals and 15 grams protein -Downgrade diet to dysphagia 2 (mechanical soft) for ease of intake secondary to lack of teeth  NUTRITION DIAGNOSIS:   Inadequate oral intake related to nausea, decreased appetite as evidenced by per patient/family report.  GOAL:   Patient will meet greater than or equal to 90% of their needs  MONITOR:   PO intake, Supplement acceptance, Labs, Weight trends, Skin, I & O's  REASON FOR ASSESSMENT:   Rounds    ASSESSMENT:   [name] Browning is a 80 y.o. male with medical history significant of dCHF,  HLD, HLD, DVT and A fib on Eliquis, COPD on 2 L oxygen, stroke, TIA, GERD, anxiety, CAD, carotid artery stenosis, kidney stone, syncope, iron deficiency anemia, who presents with shortness of breath.  Pt admitted with acute on chronic respiratory failure with hypoxia due to acute on chronic CHF.   Reviewed I/O's: -458 ml x 24 hours and -787 ml since admission  UOP: 1.1 L x 24 hours  Case discussed with MD, RN, and during ICU rounds. Pt just came off pressors and is following commands.   Per RN, pt with poor oral intake secondary to nausea and spitting up. Plan to increase nausea medications as well as obtain PT/OT evaluations.   Spoke with pt at bedside, who awakened easily to voice and touch. Pt reports feeling better but reports "food makes me sick to my stomach". He has been experiencing this problem since one week PTA. Observed breakfast tray, which was untouched. He has Ensure ordered, but refusing stating it makes him "sick to my stomach". He was sipping orange juice, which he is tolerating well.   Reviewed wt hx; wt has been stable over the past 6 months.   Discussed importance of  good meal and supplement intake to promote healing. Pt also without teeth and expresses difficulty chewing food. Pt amenable to [employer] and diet downgrade for ease of intake.   Labs reviewed: Na: 133.   NUTRITION - FOCUSED PHYSICAL EXAM:  Flowsheet Row Most Recent Value  Orbital Region No depletion  Upper Arm Region No depletion  Thoracic and Lumbar Region No depletion  Buccal Region No depletion  Temple Region Mild depletion  Clavicle Bone Region No depletion  Clavicle and Acromion Bone Region No depletion  Scapular Bone Region No depletion  Dorsal Hand No depletion  Patellar Region No depletion  Anterior Thigh Region No depletion  Posterior Calf Region No depletion  Edema (RD Assessment) Mild  Hair Reviewed  Eyes Reviewed  Mouth Reviewed  Skin Reviewed  Nails Reviewed       Diet Order:   Diet Order             DIET DYS 2 Room service appropriate? Yes; Fluid consistency: Thin  Diet effective now                   EDUCATION NEEDS:   Education needs have been addressed  Skin:  Skin Assessment: Skin Integrity Issues: Skin Integrity Issues:: Incisions Incisions: closed perineum  Last BM:  11/14/20  Height:   Ht Readings from Last 1 Encounters:  11/12/20 6\' 2"  (1.88 m)    Weight:   Wt Readings from Last 1 Encounters:  11/17/20  87.2 kg    Ideal Body Weight:  86.4 kg  BMI:  Body mass index is 24.68 kg/m.  Estimated Nutritional Needs:   Kcal:  2150-2350  Protein:  110-125 grams  Fluid:  > 2 L    Loistine Chance, RD, LDN, [place] Registered Dietitian II Certified Diabetes Care and Education Specialist Please refer to [facility] for RD and/or RD on-call/weekend/after hours pager

## 2020-11-19 DIAGNOSIS — I5033 Acute on chronic diastolic (congestive) heart failure: Secondary | ICD-10-CM | POA: Diagnosis not present

## 2020-11-19 LAB — BASIC METABOLIC PANEL
Anion gap: 8 (ref 5–15)
BUN: 50 mg/dL — ABNORMAL HIGH (ref 8–23)
CO2: 28 mmol/L (ref 22–32)
Calcium: 8.5 mg/dL — ABNORMAL LOW (ref 8.9–10.3)
Chloride: 95 mmol/L — ABNORMAL LOW (ref 98–111)
Creatinine, Ser: 1.88 mg/dL — ABNORMAL HIGH (ref 0.61–1.24)
GFR, Estimated: 36 mL/min — ABNORMAL LOW (ref >60)
Glucose, Bld: 117 mg/dL — ABNORMAL HIGH (ref 70–99)
Potassium: 4.3 mmol/L (ref 3.5–5.1)
Sodium: 131 mmol/L — ABNORMAL LOW (ref 135–145)

## 2020-11-19 LAB — MAGNESIUM: Magnesium: 2.6 mg/dL — ABNORMAL HIGH (ref 1.7–2.4)

## 2020-11-19 LAB — CBC
HCT: 30.2 % — ABNORMAL LOW (ref 39.0–52.0)
Hemoglobin: 9.9 g/dL — ABNORMAL LOW (ref 13.0–17.0)
MCH: 29.8 pg (ref 26.0–34.0)
MCHC: 32.8 g/dL (ref 30.0–36.0)
MCV: 91 fL (ref 80.0–100.0)
Platelets: 117 10*3/uL — ABNORMAL LOW (ref 150–400)
RBC: 3.32 MIL/uL — ABNORMAL LOW (ref 4.22–5.81)
RDW: 15.9 % — ABNORMAL HIGH (ref 11.5–15.5)
WBC: 8.9 10*3/uL (ref 4.0–10.5)
nRBC: 1.1 % — ABNORMAL HIGH (ref 0.0–0.2)

## 2020-11-19 MED ORDER — GUAIFENESIN ER 600 MG PO TB12
1200.0000 mg | ORAL_TABLET | Freq: Two times a day (BID) | ORAL | Status: DC
  Administered 2020-11-19 – 2020-11-21 (×4): 1200 mg via ORAL
  Filled 2020-11-19 (×4): qty 2

## 2020-11-19 MED ORDER — SODIUM CHLORIDE 0.9 % IV SOLN
12.5000 mg | Freq: Three times a day (TID) | INTRAVENOUS | Status: DC | PRN
  Filled 2020-11-19 (×3): qty 0.5

## 2020-11-19 MED ORDER — IPRATROPIUM-ALBUTEROL 0.5-2.5 (3) MG/3ML IN SOLN
3.0000 mL | Freq: Three times a day (TID) | RESPIRATORY_TRACT | Status: DC
  Administered 2020-11-19 – 2020-11-20 (×2): 3 mL via RESPIRATORY_TRACT
  Filled 2020-11-19 (×2): qty 3

## 2020-11-19 NOTE — TOC Initial Note (Addendum)
Transition of Care [facility]) - Initial/Assessment Note    Patient Details  Name: [name] Browning MRN: [id number] Date of Birth: 12-10-1940  Transition of Care [facility]) CM/SW Contact:    Kerin Salen, RN Phone Number: 11/19/2020, 2:25 PM  Clinical Narrative: Received message from the RN that patient's daughter requested call from [facility]. Attempted to call Baker Janus, no answer left voice message to return call.  2:34pm: Daughter Baker Janus returned call says she does not want patient to go to SNF, prefers to take him home she is a NA and her daughter is a Marine scientist also other nurses in the family and they are there 24hrs. However would like a rollator and HHPT when medically stable for discharge. Attending, RN and PT notified or request. 3:22pm Cindie from [facility] [phone] consents to provide HHPT for patient when discharged.                Expected Discharge Plan: Home/Self Care Barriers to Discharge: Continued Medical Work up   Patient Goals and CMS Choice Patient states their goals for this hospitalization and ongoing recovery are:: Patient wants to return home, regadless of recommendations.      Expected Discharge Plan and Services Expected Discharge Plan: Home/Self Care In-house Referral: Clinical Social Work     Living arrangements for the past 2 months: Mobile Home                                      Prior Living Arrangements/Services Living arrangements for the past 2 months: Mobile Home Lives with:: Other (Comment) (Lives with granddaughter April) Patient language and need for interpreter reviewed:: Yes Do you feel safe going back to the place where you live?: Yes      Need for Family Participation in Patient Care: Yes (Comment) Care giver support system in place?: Yes (comment)   Criminal Activity/Legal Involvement Pertinent to Current Situation/Hospitalization: No - Comment as needed  Activities of Daily Living Home Assistive Devices/Equipment: None ADL Screening  (condition at time of admission) Patient's cognitive ability adequate to safely complete daily activities?: No Is the patient deaf or have difficulty hearing?: Yes Does the patient have difficulty seeing, even when wearing glasses/contacts?: Yes Does the patient have difficulty concentrating, remembering, or making decisions?: No Patient able to express need for assistance with ADLs?: Yes Does the patient have difficulty dressing or bathing?: No Independently performs ADLs?: Yes (appropriate for developmental age) Does the patient have difficulty walking or climbing stairs?: No Weakness of Legs: Both Weakness of Arms/Hands: Both  Permission Sought/Granted Permission sought to share information with : Facility Sport and exercise psychologist    Share Information with NAME: Dierdre Forth (Daughter)   [phone] (Mobile)           Emotional Assessment Appearance:: Appears stated age Attitude/Demeanor/Rapport: Engaged Affect (typically observed): Stable Orientation: : Oriented to Self, Oriented to Place, Oriented to  Time, Oriented to Situation Alcohol / Substance Use: Not Applicable Psych Involvement: No (comment)  Admission diagnosis:  SOB (shortness of breath) [R06.02] DVT (deep venous thrombosis) (HCC) [I82.409] NSTEMI (non-ST elevated myocardial infarction) (HCC) [I21.4] Rectal bleed [K62.5] Acute on chronic diastolic CHF (congestive heart failure) (HCC) [I50.33] Acute on chronic congestive heart failure, unspecified heart failure type (HCC) [I50.9] Patient Active Problem List   Diagnosis Date Noted   Acute ST elevation myocardial infarction (STEMI) of anterior wall (HCC)    Cardiogenic shock (HCC)    Hemorrhagic shock (HCC)  Acute on chronic diastolic CHF (congestive heart failure) ([place]) 11/11/2020   GI bleeding 11/11/2020   DVT (deep venous thrombosis) (HCC)    HLD (hyperlipidemia)    Stroke [facility])    CAD (coronary artery disease)    NSTEMI (non-ST elevated myocardial  infarction) (HCC)    Hypokalemia    Thrombocytopenia (HCC)    Iron deficiency anemia    Dysphagia    Lactic acidosis    Atrial fibrillation (HCC)    Septic shock ([place]) 10/21/2020   Acute on chronic respiratory failure with hypoxia ([place]) 10/20/2020   Community acquired pneumonia 10/20/2020   Hypotension 10/20/2020   DOE (dyspnea on exertion) 10/06/2020   Chronic respiratory failure with hypoxia ([place]) 10/06/2020   Malnutrition of moderate degree 09/21/2020   Urinary tract obstruction by kidney stone 09/19/2020   AKI (acute kidney injury) ([place]) 09/19/2020   COPD exacerbation ([place]) 09/18/2020   Acute respiratory failure with hypoxia ([place]) 09/18/2020   AF (paroxysmal atrial fibrillation) ([place]) 28/31/5176   Umbilical hernia without obstruction and without gangrene    Pneumonia 03/25/2019   History of 2019 novel coronavirus disease (COVID-19) 03/06/2019   COVID-19 virus infection 02/08/2019   New onset atrial fibrillation ([place]) 02/08/2019   COPD, moderate ([place]) 09/14/2018   Degenerative tear of glenoid labrum of right shoulder 08/24/2017   Injury of tendon of long head of right biceps 08/21/2017   Rotator cuff tendinitis, right 08/21/2017   Traumatic complete tear of right rotator cuff 08/21/2017   Chronic neck pain 03/09/2017   Degenerative disc disease, cervical 03/09/2017   Perennial allergic rhinitis 03/09/2017   Hyperglycemia 09/01/2015   Elevated rheumatoid factor 04/08/2015   Arthritis of both hands 04/01/2015   Bilateral hand pain 03/18/2015   Elevated alkaline phosphatase level 03/02/2015   Anxiety 02/16/2015   Intermittent chest pain 02/16/2015   History of TIA (transient ischemic attack) 05/18/2014   HTN (hypertension) 05/18/2014   Hyperlipidemia 05/18/2014   Status post carpal tunnel release 06/26/2013   PCP:  [place] Pharmacy:   [id number] - [place], [place] - [address] [place] [address] Phone: [phone] Fax:  [phone]     Social Determinants of Health (SDOH) Interventions    Readmission Risk Interventions No flowsheet data found.

## 2020-11-19 NOTE — Progress Notes (Signed)
PROGRESS NOTE    [name] Browning  CXK:[id number] DOB: 1940/02/02 DOA: 11/11/2020 PCP: [place], [place]  [id number]/[id number]   Assessment & Plan:   Principal Problem:   Acute on chronic diastolic CHF (congestive heart failure) (HCC) Active Problems:   HTN (hypertension)   COPD, moderate (HCC)   Acute on chronic respiratory failure with hypoxia (HCC)   Atrial fibrillation (HCC)   DVT (deep venous thrombosis) (HCC)   HLD (hyperlipidemia)   Stroke (HCC)   CAD (coronary artery disease)   NSTEMI (non-ST elevated myocardial infarction) (HCC)   Hypokalemia   Thrombocytopenia (HCC)   Iron deficiency anemia   GI bleeding   Acute ST elevation myocardial infarction (STEMI) of anterior wall (HCC)   Cardiogenic shock ([place])   Hemorrhagic shock [facility])   [name] Browning is a 80 year old male with history of CAD (nonobstructive calcified coronary disease), atrial fibrillation on Eliquis, HFpEF, hypertension, TIA in 2016, COPD on 2 L who was admitted to the hospital on 11/2 with shortness of breath.  He is admitted to the hospital 2 times in the last month.   First admission was from 10/20/2020 to 10/31/2020 when he was admitted with respiratory failure due to COPD and pneumonia, as well as an obstructive left renal stone.  He developed hypotension and worsening respiratory distress requiring pressors and BiPAP.     Blood cultures grew positive for Enterococcus faecalis and staph hemolyticus.  He was treated with antibiotics ending 11/03/2020.  The patient was recently admitted to the hospital from 10/25-10/28 with complaints of dysphagia.     He was seen by gastroenterology who underwent an EGD with no significant findings.  Since that time he has had progressive worsening shortness of breath, eventually presented to the emergency department where he was found to have an oxygen saturation of 88% on his home 2 L of oxygen.    ACUTE on chronic hypoxic respiratory failure on 2L at baseline --currently back  on home 2L --Continue supplemental O2 to keep sats between 88-92%   Acute Anterior STEMI  Hx of Ischemic Cardiomyopathy - CODE STEMI CALLED 11/5 with ST elevations V2,V3.  cardiac catheterization showed no significant obstruction in the LAD with occluded RCA and collaterals from LAD to RCA.  It appeared that RCA was chronically occluded. No intervention. --s/p heparin gtt Plan: --cont ASA and statin --cont to hold beta blocker due to hypotension --If chest pain, SL nitro and ECG and contact oncall cardiology Dr. Nehemiah Massed  Acute on chronic diastolic CHF Echo 97/0: LVEF 60-65%, LV regional wall motion abnormalities, Grade 1 diastolic dysfunction, RV systolic function severely reduced with RV severe enlargement, LA + RA severely dilated --s/p IV lasix diuresis   CARDIOGENIC SHOCK, resolved Hypotension, improved --weaned off Levophed and dopamine as of morning of 11/9 --cont midodrine 10 mg TID   Anemia, acute on chronic Iron deficiency versus ? Acute blood loss secondary to GI bleed Presented with dark-colored stools and stool occult positive, noted drop in hgb which normalized s/p pRBC transfusion.  - GI following --s/p 2u pRBC Plan: --no plan for endoscopic procedures at this time, per GI - Protonix 40 IV every 12 hours   ACUTE KIDNEY INJURY --dehydration, contrast.  Cr improved with gentle IVF hydration --encourage oral hydration  COPD --cont daily bronchodilators   DVT prophylaxis: YO:VZCHYIF Code Status: Full code  Family Communication:  Level of care: stepdown Dispo:   The patient is from: home Anticipated d/c is to: home with [facility] (pt and family refused SNF rehab)  Anticipated d/c date is: 2-3 days Patient currently is not medically ready to d/c due to: still having chest pain, with recent STEMI   Subjective and Interval History:  Pt was sleepy this morning, which daughter thought was due to the scheduled phenergan (ordered to help with nausea and oral intake).   Still not eating much.    Later in mid-afternoon, pt had sudden onset chest pain, which resolved with SL nitro.  STAT ECG showed no ACS-related changes.     Objective: Vitals:   11/19/20 1530 11/19/20 1600 11/19/20 1630 11/19/20 1700  BP: (!) 108/52 91/71 109/70 99/82  Pulse: 74 86 73 80  Resp: (!) 28 (!) 22 (!) 22 (!) 23  Temp:  98.5 F (36.9 C)    TempSrc:  Oral    SpO2: 97% 97% 97% 96%  Weight:      Height:        Intake/Output Summary (Last 24 hours) at 11/19/2020 1841 Last data filed at 11/19/2020 1700 Gross per 24 hour  Intake 1888.33 ml  Output 750 ml  Net 1138.33 ml   Filed Weights   11/16/20 0500 11/17/20 0500 11/19/20 0500  Weight: 89.5 kg 87.2 kg 94.5 kg    Examination:   Constitutional: NAD, sleepy, but arousable, oriented HEENT: conjunctivae and lids normal, EOMI CV: No cyanosis.   RESP: normal respiratory effort, on 2L Extremities: No effusions, edema in BLE.  Right arm swollen. SKIN: warm, dry Neuro: II - XII grossly intact.   Psych: subdued mood and affect.    Data Reviewed: I have personally reviewed following labs and imaging studies  CBC: Recent Labs  Lab 11/14/20 0945 11/14/20 1342 11/16/20 0510 11/17/20 0514 11/17/20 2315 11/18/20 0506 11/19/20 0503  WBC 19.3*   < > 12.7* 9.0 9.4 8.6 8.9  NEUTROABS 17.7*  --   --   --   --   --   --   HGB 9.3*   < > 9.4* 8.6* 9.9* 9.9* 9.9*  HCT 30.0*   < > 30.0* 27.6* 30.9* 30.6* 30.2*  MCV 95.5   < > 92.0 91.7 91.7 89.5 91.0  PLT 208   < > 189 141* 133* 140* 117*   < > = values in this interval not displayed.   Basic Metabolic Panel: Recent Labs  Lab 11/14/20 0710 11/14/20 0945 11/15/20 0401 11/16/20 0510 11/17/20 0514 11/18/20 0506 11/19/20 0503  NA 138   < > 138 135 132* 133* 131*  K 4.1   < > 4.3 4.0 3.8 4.1 4.3  CL 99   < > 102 98 96* 96* 95*  CO2 27   < > 27 28 28 28 28   GLUCOSE 85   < > 145* 133* 114* 120* 117*  BUN 33*   < > 45* 44* 44* 56* 50*  CREATININE 1.49*   < > 2.17*  2.01* 1.67* 2.28* 1.88*  CALCIUM 8.3*   < > 7.9* 8.1* 8.2* 8.6* 8.5*  MG 1.8  --  2.5* 2.3  --   --  2.6*  PHOS 3.2  --  5.1* 4.5 4.1  --   --    < > = values in this interval not displayed.   GFR: Estimated Creatinine Clearance: 37 mL/min (A) (by C-G formula based on SCr of 1.88 mg/dL (H)). Liver Function Tests: Recent Labs  Lab 11/14/20 0710 11/15/20 0401 11/16/20 0510 11/17/20 0514  AST 156*  --   --   --   ALT 170*  --   --   --  ALKPHOS 90  --   --   --   BILITOT 1.6*  --   --   --   PROT 5.1*  --   --   --   ALBUMIN 2.5* 3.0* 3.1* 2.8*   No results for input(s): LIPASE, AMYLASE in the last 168 hours. No results for input(s): AMMONIA in the last 168 hours. Coagulation Profile: No results for input(s): INR, PROTIME in the last 168 hours. Cardiac Enzymes: No results for input(s): CKTOTAL, CKMB, CKMBINDEX, TROPONINI in the last 168 hours. BNP (last 3 results) No results for input(s): PROBNP in the last 8760 hours. HbA1C: No results for input(s): HGBA1C in the last 72 hours. CBG: Recent Labs  Lab 11/14/20 0611  GLUCAP 100*   Lipid Profile: No results for input(s): CHOL, HDL, LDLCALC, TRIG, CHOLHDL, LDLDIRECT in the last 72 hours. Thyroid Function Tests: No results for input(s): TSH, T4TOTAL, FREET4, T3FREE, THYROIDAB in the last 72 hours. Anemia Panel: No results for input(s): VITAMINB12, FOLATE, FERRITIN, TIBC, IRON, RETICCTPCT in the last 72 hours. Sepsis Labs: Recent Labs  Lab 11/14/20 1342 11/14/20 1616 11/14/20 1837 11/15/20 0401  LATICACIDVEN 4.1* 3.2* 2.9* 1.9    Recent Results (from the past 240 hour(s))  Resp Panel by RT-PCR (Flu A&B, Covid) Nasopharyngeal Swab     Status: None   Collection Time: 11/11/20  1:40 PM   Specimen: Nasopharyngeal Swab; Nasopharyngeal(NP) swabs in vial transport medium  Result Value Ref Range Status   SARS Coronavirus 2 by RT PCR NEGATIVE NEGATIVE Final    Comment: (NOTE) SARS-CoV-2 target nucleic acids are NOT  DETECTED.  The SARS-CoV-2 RNA is generally detectable in upper respiratory specimens during the acute phase of infection. The lowest concentration of SARS-CoV-2 viral copies this assay can detect is 138 copies/mL. A negative result does not preclude SARS-Cov-2 infection and should not be used as the sole basis for treatment or other patient management decisions. A negative result may occur with  improper specimen collection/handling, submission of specimen other than nasopharyngeal swab, presence of viral mutation(s) within the areas targeted by this assay, and inadequate number of viral copies(<138 copies/mL). A negative result must be combined with clinical observations, patient history, and epidemiological information. The expected result is Negative.  Fact Sheet for Patients:  EntrepreneurPulse.com.au  Fact Sheet for Healthcare Providers:  IncredibleEmployment.be  This test is no t yet approved or cleared by the [place] FDA and  has been authorized for detection and/or diagnosis of SARS-CoV-2 by FDA under an Emergency Use Authorization (EUA). This EUA will remain  in effect (meaning this test can be used) for the duration of the COVID-19 declaration under Section 564(b)(1) of the Act, 21 U.S.C.section 360bbb-3(b)(1), unless the authorization is terminated  or revoked sooner.       Influenza A by PCR NEGATIVE NEGATIVE Final   Influenza B by PCR NEGATIVE NEGATIVE Final    Comment: (NOTE) The Xpert Xpress SARS-CoV-2/FLU/RSV plus assay is intended as an aid in the diagnosis of influenza from Nasopharyngeal swab specimens and should not be used as a sole basis for treatment. Nasal washings and aspirates are unacceptable for Xpert Xpress SARS-CoV-2/FLU/RSV testing.  Fact Sheet for Patients: EntrepreneurPulse.com.au  Fact Sheet for Healthcare Providers: IncredibleEmployment.be  This test is not yet  approved or cleared by the [place] FDA and has been authorized for detection and/or diagnosis of SARS-CoV-2 by FDA under an Emergency Use Authorization (EUA). This EUA will remain in effect (meaning this test can be used) for the  duration of the COVID-19 declaration under Section 564(b)(1) of the Act, 21 U.S.C. section 360bbb-3(b)(1), unless the authorization is terminated or revoked.  Performed at [facility], [address] [place], [place] [address]       Radiology Studies: DG Abd 1 View  Result Date: 11/18/2020 CLINICAL DATA:  Nausea and vomiting. EXAM: ABDOMEN - 1 VIEW COMPARISON:  10/20/2020. FINDINGS: Air is seen in the stomach. Gas in nondistended small bowel and colon. Gas in the rectum. Possible left renal stone. Coarsened basilar pulmonary markings. IMPRESSION: 1. No acute findings. 2. Possible left renal stone. 3. Coarsened basilar pulmonary markings. Electronically Signed   By: Lorin Picket M.D.   On: 11/18/2020 10:35     Scheduled Meds:  (feeding supplement) PROSource Plus  30 mL Oral TID BM   sodium chloride   Intravenous Once   apixaban  5 mg Oral BID   aspirin EC  81 mg Oral Daily   atorvastatin  40 mg Oral QPC supper   Chlorhexidine Gluconate Cloth  6 each Topical Daily   feeding supplement  1 Container Oral TID BM   ferrous gluconate  324 mg Oral QPC supper   ipratropium-albuterol  3 mL Nebulization TID   methocarbamol  500 mg Oral TID   midodrine  10 mg Oral TID WC   mometasone-formoterol  2 puff Inhalation BID   multivitamin with minerals  1 tablet Oral Daily   pantoprazole (PROTONIX) IV  40 mg Intravenous Q12H   sodium chloride flush  10-40 mL Intracatheter Q12H   sodium chloride flush  3 mL Intravenous Q12H   tamsulosin  0.4 mg Oral Daily   Continuous Infusions:  sodium chloride     sodium chloride     promethazine (PHENERGAN) injection (IM or IVPB)       LOS: 8 days     Enzo Bi, MD [facility] If 7PM-7AM, please  contact night-coverage 11/19/2020, 6:41 PM

## 2020-11-19 NOTE — Progress Notes (Signed)
SUBJECTIVE: [name] is a 80 year old male with history of CAD (nonobstructive calcified coronary disease), atrial fibrillation on Eliquis, HFpEF, hypertension, TIA in 2016, COPD on 2 L who was admitted to the hospital on 11/2 with shortness of breath.  Cardiac catheterization on 11/14/20 showed no significant obstruction in the LAD with occluded RCA and collaterals from LAD to RCA.  It appeared that RCA was chronically occluded.   Currently denies chest pain. Intermittent shortness of breath.    Vitals:   11/19/20 0300 11/19/20 0400 11/19/20 0500 11/19/20 0600  BP: 110/65 (!) 104/57 108/66 106/67  Pulse: 84 (!) 59 (!) 58 68  Resp: (!) 27 (!) 25 17 18   Temp:  97.8 F (36.6 C)    TempSrc:  Oral    SpO2: 92% 95% 94% 95%  Weight:   94.5 kg   Height:        Intake/Output Summary (Last 24 hours) at 11/19/2020 0845 Last data filed at 11/19/2020 3419 Gross per 24 hour  Intake 1327 ml  Output 925 ml  Net 402 ml    LABS: Basic Metabolic Panel: Recent Labs    11/17/20 0514 11/18/20 0506 11/19/20 0503  NA 132* 133* 131*  K 3.8 4.1 4.3  CL 96* 96* 95*  CO2 28 28 28   GLUCOSE 114* 120* 117*  BUN 44* 56* 50*  CREATININE 1.67* 2.28* 1.88*  CALCIUM 8.2* 8.6* 8.5*  MG  --   --  2.6*  PHOS 4.1  --   --    Liver Function Tests: Recent Labs    11/17/20 0514  ALBUMIN 2.8*   No results for input(s): LIPASE, AMYLASE in the last 72 hours. CBC: Recent Labs    11/18/20 0506 11/19/20 0503  WBC 8.6 8.9  HGB 9.9* 9.9*  HCT 30.6* 30.2*  MCV 89.5 91.0  PLT 140* 117*   Cardiac Enzymes: No results for input(s): CKTOTAL, CKMB, CKMBINDEX, TROPONINI in the last 72 hours. BNP: Invalid input(s): POCBNP D-Dimer: No results for input(s): DDIMER in the last 72 hours. Hemoglobin A1C: No results for input(s): HGBA1C in the last 72 hours. Fasting Lipid Panel: No results for input(s): CHOL, HDL, LDLCALC, TRIG, CHOLHDL, LDLDIRECT in the last 72 hours. Thyroid Function Tests: No results for  input(s): TSH, T4TOTAL, T3FREE, THYROIDAB in the last 72 hours.  Invalid input(s): FREET3 Anemia Panel: No results for input(s): VITAMINB12, FOLATE, FERRITIN, TIBC, IRON, RETICCTPCT in the last 72 hours.   PHYSICAL EXAM General: Well developed, well nourished, in no acute distress HEENT:  Normocephalic and atramatic Neck:  No JVD.  Lungs: Clear bilaterally to auscultation and percussion. Heart: HRRR . Normal S1 and S2 without gallops or murmurs.  Abdomen: Bowel sounds are positive, abdomen soft and non-tender  Msk:  Back normal, normal gait. Normal strength and tone for age. Extremities: No clubbing, cyanosis or edema.   Neuro: Alert and oriented X 3. Psych:  Good affect, responds appropriately  TELEMETRY: sinus rhythm, HR 76  ASSESSMENT AND PLAN: Status post ST elevation in the anterior leads with cardiac catheterization revealing no significant obstruction in the LAD with occluded RCA and collaterals from LAD to RCA.  It appeared that RCA was chronically occluded. Patient blood pressure improved today. Patient is improving gradually.  Continue medical therapy including aspirin and IV heparin.  Principal Problem:   Acute on chronic diastolic CHF (congestive heart failure) (HCC) Active Problems:   HTN (hypertension)   COPD, moderate (HCC)   Acute on chronic respiratory failure with hypoxia ([place])  Atrial fibrillation (HCC)   DVT (deep venous thrombosis) (HCC)   HLD (hyperlipidemia)   Stroke [facility])   CAD (coronary artery disease)   NSTEMI (non-ST elevated myocardial infarction) (HCC)   Hypokalemia   Thrombocytopenia (HCC)   Iron deficiency anemia   GI bleeding   Acute ST elevation myocardial infarction (STEMI) of anterior wall (HCC)   Cardiogenic shock (HCC)   Hemorrhagic shock ([place])    Dorianna Mckiver, FNP-C 11/19/2020 8:45 AM

## 2020-11-19 NOTE — Evaluation (Signed)
Physical Therapy Evaluation Patient Details Name: [name] Browning MRN: [id number] DOB: 12-27-1940 Today's Date: 11/19/2020  History of Present Illness  [name] Browning is a 80 y.o. male with a past medical history of anxiety, CAD, COPD on 2 L of oxygen, prior CVA, CHF, hypertension, hyperlipidemia, presents to the emergency department for shortness of breath.  According to the patient he was recently discharged from the hospital 10/28 for shortness of breath due to CHF exacerbation. ICU stay since 11/05 for treatment of hypotension, hypoxemia, STEMI,  possible GI bleed.   Clinical Impression  Pt admitted with above diagnosis. Pt received upright in bed with daughter present agreeable to PT services. Reports same home lay out, PLOF (indep with AD in household with ADL's/IADL's), DME, and available assistance without difficulty. Currently on 2L/min with SPO2 > 90%. Pt does require minA for RLE management and modA at torso with bed maximally elevated to sit EOB with cuing for sequencing and hand placement. Pt slow and requires extra time to complete due to weakness. Does desat to 88% with transfer requiring titration to 4L/min of O2 to maintain > 90%. Mild dizziness is reported with sitting up with slumped posture with difficulty keeping head extended to neutral positioning requiring frequent VC's to correct. As pt fatigues pt will flex neck due to difficulty keeping head upright. Requires minA to maintain static sitting balance and minguard to stand to RW and step transfer to [facility]. BLE's buckle 2-3 times with standing requiring inA from PT to assist. Shakiness in LE's noted with ehavy UE reliance on RW for support. Possible vasovagal or orthostatic response as pt endorses increased dizziness with decreased in HR to mid 40's but after sitting 2-3 min HR does return to mid 70's. Pt alert and able to hold conversation despite decrease. RN notified and stayed in room for remainder of mobility. Unable to void B/B so pt stood  to RW and pstep transfers again to recliner with minguard. Pt returned to 2L/min with SPO2 > 90%. BP assessed at 112/62 mm Hg post session. Pt will require STR due to deficits in upright tolerance to activity, increased assist for bed mobility and weakness and poor endurance in LE's with standing and transfers. Pt with frequent admissions and is below baseline mobility. Pt currently with functional limitations due to the deficits listed below (see PT Problem List). Pt will benefit from skilled PT to increase their independence and safety with mobility to allow discharge to the venue listed below.      Recommendations for follow up therapy are one component of a multi-disciplinary discharge planning process, led by the attending physician.  Recommendations may be updated based on patient status, additional functional criteria and insurance authorization.  Follow Up Recommendations Skilled nursing-short term rehab (<3 hours/day)    Assistance Recommended at Discharge Intermittent Supervision/Assistance  Functional Status Assessment Patient has had a recent decline in their functional status and demonstrates the ability to make significant improvements in function in a reasonable and predictable amount of time.  Equipment Recommendations  Other (comment) (tbd by next venue of care)    Recommendations for Other Services       Precautions / Restrictions Precautions Precautions: Fall Restrictions Weight Bearing Restrictions: No      Mobility  Bed Mobility Overal bed mobility: Needs Assistance Bed Mobility: Supine to Sit     Supine to sit: Min assist;HOB elevated     General bed mobility comments: Requires minA for LE management to EOB (R primarily) and modA at torso  to sit at EOB with HOB max elevated. Patient Response: Cooperative  Transfers Overall transfer level: Needs assistance Equipment used: Rolling walker (2 wheels)   Sit to Stand: Min guard           General transfer  comment: LE's buckle initially. Difficulty standing upright from LE weakness    Ambulation/Gait Ambulation/Gait assistance: Min guard Gait Distance (Feet): 3 Feet Assistive device: Rolling walker (2 wheels) Gait Pattern/deviations: Step-to pattern;Trunk flexed;Narrow base of support       General Gait Details: Short bouts stepping from bed to [facility] then BSC to recliner. Endorses dizziness with activity.  Stairs            Wheelchair Mobility    Modified Rankin (Stroke Patients Only)       Balance Overall balance assessment: Needs assistance Sitting-balance support: Bilateral upper extremity supported;Feet supported Sitting balance-Leahy Scale: Poor Sitting balance - Comments: Does rely on minguard for static sitting balance with UE support on bed.   Standing balance support: During functional activity;Bilateral upper extremity supported Standing balance-Leahy Scale: Poor Standing balance comment: Heavy reliance on RW for support.                             Pertinent Vitals/Pain Pain Assessment: No/denies pain    Home Living Family/patient expects to be discharged to:: Private residence Living Arrangements: Children;Spouse/significant other Available Help at Discharge: Family;Available 24 hours/day Type of Home: Mobile home Home Access: Stairs to enter Entrance Stairs-Rails: Right;Left;Can reach both Entrance Stairs-Number of Steps: 3   Home Layout: One level Home Equipment: Shower seat;Cane - single point Additional Comments: does not use AD at baseline, Home O2 2-3L. Has RW from previous admission    Prior Function Prior Level of Function : Independent/Modified Independent;Needs assist       Physical Assist : ADLs (physical)   ADLs (physical): IADLs Mobility Comments: Has been deconditioned from previous hospitalizations       Hand Dominance   Dominant Hand: Right    Extremity/Trunk Assessment   Upper Extremity Assessment Upper  Extremity Assessment: Generalized weakness    Lower Extremity Assessment Lower Extremity Assessment: Generalized weakness    Cervical / Trunk Assessment Cervical / Trunk Assessment: Normal  Communication   Communication: No difficulties  Cognition Arousal/Alertness: Awake/alert Behavior During Therapy: WFL for tasks assessed/performed Overall Cognitive Status: Within Functional Limits for tasks assessed                                          General Comments General comments (skin integrity, edema, etc.): SPO2 does decrease to 88%. Relied on 4 L/min [place] to maintain sats > 90%    Exercises General Exercises - Lower Extremity Ankle Circles/Pumps: AROM;Both;10 reps Hip Flexion/Marching: AROM;Seated;Both;10 reps Other Exercises Other Exercises: Role of PT in acute setting, D/c recs, safe use of DME   Assessment/Plan    PT Assessment Patient needs continued PT services  PT Problem List Decreased strength;Decreased mobility;Decreased safety awareness;Decreased activity tolerance;Decreased balance;Cardiopulmonary status limiting activity       PT Treatment Interventions DME instruction;Therapeutic activities;Gait training;Therapeutic exercise;Patient/family education;Stair training;Balance training;Functional mobility training;Neuromuscular re-education    PT Goals (Current goals can be found in the Care Plan section)  Acute Rehab PT Goals Patient Stated Goal: to go home PT Goal Formulation: With patient/family Time For Goal Achievement: 12/03/20 Potential to Achieve Goals:  Fair    Frequency Min 2X/week   Barriers to discharge        Co-evaluation               AM-PAC PT "6 Clicks" Mobility  Outcome Measure Help needed turning from your back to your side while in a flat bed without using bedrails?: A Lot Help needed moving from lying on your back to sitting on the side of a flat bed without using bedrails?: A Lot Help needed moving to and from a  bed to a chair (including a wheelchair)?: A Little Help needed standing up from a chair using your arms (e.g., wheelchair or bedside chair)?: A Little Help needed to walk in hospital room?: A Little Help needed climbing 3-5 steps with a railing? : A Lot 6 Click Score: 15    End of Session Equipment Utilized During Treatment: Gait belt;Oxygen Activity Tolerance: Patient limited by fatigue Patient left: in chair;with family/visitor present;with call bell/phone within reach Nurse Communication: Mobility status PT Visit Diagnosis: Unsteadiness on feet (R26.81);Other abnormalities of gait and mobility (R26.89);Muscle weakness (generalized) (M62.81);Difficulty in walking, not elsewhere classified (R26.2)    Time: 9678-9381 PT Time Calculation (min) (ACUTE ONLY): 30 min   Charges:   PT Evaluation $PT Eval Moderate Complexity: 1 Mod PT Treatments $Therapeutic Activity: 8-22 mins       Zionna Homewood M. Fairly IV, PT, DPT Physical Therapist- [facility]  11/19/2020, 11:00 AM

## 2020-11-19 NOTE — Progress Notes (Signed)
Pt reporting severe crushing chest pain. SL Nitro given, STAT ECG done and MD notified. BP WNL at this time. HR going into 30's at times and continues to be irregular and in Afib. Awaiting new orders at this time. Will continue to monitor.

## 2020-11-20 DIAGNOSIS — I5033 Acute on chronic diastolic (congestive) heart failure: Secondary | ICD-10-CM | POA: Diagnosis not present

## 2020-11-20 LAB — URINALYSIS, ROUTINE W REFLEX MICROSCOPIC
Bilirubin Urine: NEGATIVE
Glucose, UA: NEGATIVE mg/dL
Ketones, ur: NEGATIVE mg/dL
Nitrite: NEGATIVE
Protein, ur: 100 mg/dL — AB
RBC / HPF: >50 RBC/hpf — ABNORMAL HIGH (ref 0–5)
Specific Gravity, Urine: 1.018 (ref 1.005–1.030)
WBC, UA: >50 WBC/hpf — ABNORMAL HIGH (ref 0–5)
pH: 5 (ref 5.0–8.0)

## 2020-11-20 LAB — CBC
HCT: 30.4 % — ABNORMAL LOW (ref 39.0–52.0)
Hemoglobin: 9.5 g/dL — ABNORMAL LOW (ref 13.0–17.0)
MCH: 28.7 pg (ref 26.0–34.0)
MCHC: 31.3 g/dL (ref 30.0–36.0)
MCV: 91.8 fL (ref 80.0–100.0)
Platelets: 114 10*3/uL — ABNORMAL LOW (ref 150–400)
RBC: 3.31 MIL/uL — ABNORMAL LOW (ref 4.22–5.81)
RDW: 16.2 % — ABNORMAL HIGH (ref 11.5–15.5)
WBC: 12.1 10*3/uL — ABNORMAL HIGH (ref 4.0–10.5)
nRBC: 0.2 % (ref 0.0–0.2)

## 2020-11-20 LAB — TROPONIN I (HIGH SENSITIVITY)
Troponin I (High Sensitivity): 3376 ng/L (ref <18)
Troponin I (High Sensitivity): 2737 ng/L (ref <18)

## 2020-11-20 LAB — BASIC METABOLIC PANEL
Anion gap: 7 (ref 5–15)
BUN: 51 mg/dL — ABNORMAL HIGH (ref 8–23)
CO2: 29 mmol/L (ref 22–32)
Calcium: 8.5 mg/dL — ABNORMAL LOW (ref 8.9–10.3)
Chloride: 95 mmol/L — ABNORMAL LOW (ref 98–111)
Creatinine, Ser: 1.58 mg/dL — ABNORMAL HIGH (ref 0.61–1.24)
GFR, Estimated: 44 mL/min — ABNORMAL LOW (ref >60)
Glucose, Bld: 133 mg/dL — ABNORMAL HIGH (ref 70–99)
Potassium: 5.4 mmol/L — ABNORMAL HIGH (ref 3.5–5.1)
Sodium: 131 mmol/L — ABNORMAL LOW (ref 135–145)

## 2020-11-20 LAB — GLUCOSE, CAPILLARY: Glucose-Capillary: 111 mg/dL — ABNORMAL HIGH (ref 70–99)

## 2020-11-20 LAB — MAGNESIUM: Magnesium: 2.2 mg/dL (ref 1.7–2.4)

## 2020-11-20 MED ORDER — SODIUM CHLORIDE 0.9 % IV SOLN
1.0000 g | Freq: Two times a day (BID) | INTRAVENOUS | Status: DC
  Administered 2020-11-20: 1 g via INTRAVENOUS
  Filled 2020-11-20: qty 10
  Filled 2020-11-20: qty 1

## 2020-11-20 MED ORDER — ISOSORBIDE MONONITRATE ER 30 MG PO TB24
15.0000 mg | ORAL_TABLET | Freq: Every day | ORAL | Status: DC
  Administered 2020-11-20 – 2020-12-02 (×13): 15 mg via ORAL
  Filled 2020-11-20 (×13): qty 1

## 2020-11-20 MED ORDER — SODIUM CHLORIDE 0.9 % IV BOLUS
500.0000 mL | Freq: Once | INTRAVENOUS | Status: AC
  Administered 2020-11-20: 500 mL via INTRAVENOUS

## 2020-11-20 MED ORDER — PHENAZOPYRIDINE HCL 100 MG PO TABS
100.0000 mg | ORAL_TABLET | Freq: Three times a day (TID) | ORAL | Status: DC
  Administered 2020-11-20 (×2): 100 mg via ORAL
  Filled 2020-11-20 (×4): qty 1

## 2020-11-20 MED ORDER — PANTOPRAZOLE SODIUM 40 MG PO TBEC
40.0000 mg | DELAYED_RELEASE_TABLET | Freq: Two times a day (BID) | ORAL | Status: DC
  Administered 2020-11-20 – 2020-12-02 (×24): 40 mg via ORAL
  Filled 2020-11-20 (×24): qty 1

## 2020-11-20 MED ORDER — SODIUM CHLORIDE 0.9 % IV SOLN
1.0000 g | INTRAVENOUS | Status: DC
  Administered 2020-11-21 – 2020-11-22 (×2): 1 g via INTRAVENOUS
  Filled 2020-11-20: qty 1
  Filled 2020-11-20 (×3): qty 10

## 2020-11-20 MED ORDER — ATROPINE SULFATE 1 MG/10ML IJ SOSY
PREFILLED_SYRINGE | INTRAMUSCULAR | Status: AC
  Filled 2020-11-20: qty 10

## 2020-11-20 NOTE — Progress Notes (Signed)
Physical Therapy Treatment Patient Details Name: [name] Browning MRN: [id number] DOB: 1940-06-10 Today's Date: 11/20/2020   History of Present Illness [name] Browning is a 80 y.o. male with a past medical history of anxiety, CAD, COPD on 2 L of oxygen, prior CVA, CHF, hypertension, hyperlipidemia, presents to the emergency department for shortness of breath.  According to the patient he was recently discharged from the hospital 10/28 for shortness of breath due to CHF exacerbation. ICU stay since 11/05 for treatment of hypotension, hypoxemia, STEMI,  possible GI bleed.    PT Comments    Pt received sleeping in bed agreeable to PT services. Daughter in room. HR trending in 70's to 80's prior to mobility and SPO2 > 90% on 4 L via [place]. Requiring HOB elevated, able to transfer with very little minA to EOB for LE management. Still endorsing mild dizziness in sitting requiring minguard for safety. Pt very deconditioned with slumped posture requiring frequent cuing to maintain head up to engage core and paraspinal musculature. Difficult to maintain as pt would return to head down in less than a in. Reports needing to have BM, so with minguard pt stood to RW and amb 2' to [facility] Decreased eccentric control with descent requiring heavy UE support on RW and then [facility] to assist in LE's. Pt maintains slumped posture with BUE reliance on RW to attempt to stay upright during toileting. Pt attempts to perform perihygiene but does end up requiring RN and PT assist. minA to stand to RW for RN to perform perihygiene. Maintained standing close to 2 min with HR reaching 101 BPM with just maintaining static standing. Pt seated in recliner post stand. Pt appears to just be significantly fatigued and deconditioned with transferring to [facility] and does report he would have difficulty in attempting further mobility. Vitals do remain stable throughout session which is an improvement from previous session. Although pt is requiring very minimal  physical assist, pt is significantly deconditioned and will benefit from STR prior to transitioning home to improve deficits in strength and endurance with OOB mobility.   Recommendations for follow up therapy are one component of a multi-disciplinary discharge planning process, led by the attending physician.  Recommendations may be updated based on patient status, additional functional criteria and insurance authorization.  Follow Up Recommendations  Skilled nursing-short term rehab (<3 hours/day)     Assistance Recommended at Discharge Intermittent Supervision/Assistance  Equipment Recommendations  Other (comment) (next venue of care)    Recommendations for Other Services       Precautions / Restrictions Precautions Precautions: Fall Restrictions Weight Bearing Restrictions: No     Mobility  Bed Mobility Overal bed mobility: Needs Assistance Bed Mobility: Supine to Sit     Supine to sit: Min assist     General bed mobility comments: Placed in recliner post session Patient Response: Cooperative  Transfers Overall transfer level: Needs assistance Equipment used: Rolling walker (2 wheels) Transfers: Sit to/from Stand Sit to Stand: Min guard           General transfer comment: Difficulty staying upright. Heavy UE reliance on RW and difficulty keeping head up    Ambulation/Gait Ambulation/Gait assistance: Min guard Gait Distance (Feet): 2 Feet Assistive device: Rolling walker (2 wheels) Gait Pattern/deviations: Step-to pattern;Trunk flexed;Narrow base of support       General Gait Details: Amb to [facility]   Stairs             Wheelchair Mobility    Modified Rankin (Stroke Patients Only)  Balance Overall balance assessment: Needs assistance Sitting-balance support: Bilateral upper extremity supported;Feet supported Sitting balance-Leahy Scale: Poor Sitting balance - Comments: Does rely on minguard for static sitting balance with UE support on  bed. Postural control:  (anterior trunk lean) Standing balance support: During functional activity;Bilateral upper extremity supported;Reliant on assistive device for balance Standing balance-Leahy Scale: Poor Standing balance comment: Heavy reliance on RW for support. Difficulty maintaining head elevated to scan environment                            Cognition Arousal/Alertness: Awake/alert Behavior During Therapy: WFL for tasks assessed/performed Overall Cognitive Status: Within Functional Limits for tasks assessed                                          Exercises Other Exercises Other Exercises: assist pt in tolieting, pursed lip breathing    General Comments General comments (skin integrity, edema, etc.): Maintains HR trending from 74-101 BPM with all activity with SPO@ > 90% throughout      Pertinent Vitals/Pain Pain Assessment: No/denies pain    Home Living                          Prior Function            PT Goals (current goals can now be found in the care plan section) Acute Rehab PT Goals Patient Stated Goal: to go home PT Goal Formulation: With patient/family Time For Goal Achievement: 12/03/20 Potential to Achieve Goals: Fair Progress towards PT goals: Progressing toward goals    Frequency    Min 2X/week      PT Plan Current plan remains appropriate    Co-evaluation              AM-PAC PT "6 Clicks" Mobility   Outcome Measure  Help needed turning from your back to your side while in a flat bed without using bedrails?: A Little Help needed moving from lying on your back to sitting on the side of a flat bed without using bedrails?: A Lot Help needed moving to and from a bed to a chair (including a wheelchair)?: A Little Help needed standing up from a chair using your arms (e.g., wheelchair or bedside chair)?: A Little Help needed to walk in hospital room?: A Lot Help needed climbing 3-5 steps with a  railing? : A Lot 6 Click Score: 15    End of Session Equipment Utilized During Treatment: Gait belt;Oxygen Activity Tolerance: Patient limited by fatigue Patient left: in chair;with family/visitor present;with call bell/phone within reach Nurse Communication: Mobility status PT Visit Diagnosis: Unsteadiness on feet (R26.81);Other abnormalities of gait and mobility (R26.89);Muscle weakness (generalized) (M62.81);Difficulty in walking, not elsewhere classified (R26.2)     Time: 9628-3662 PT Time Calculation (min) (ACUTE ONLY): 50 min  Charges:  $Therapeutic Activity: 38-52 mins                     Tamre Cass M. Fairly IV, PT, DPT Physical Therapist- [facility]  11/20/2020, 3:25 PM

## 2020-11-20 NOTE — Progress Notes (Signed)
Family at bedside, brought patient [name] Browning for dinner.

## 2020-11-20 NOTE — Progress Notes (Signed)
[facility] Cardiology [facility] Encounter Note  Patient: [name] Browning / Admit Date: 11/11/2020 / Date of Encounter: 11/20/2020, 2:50 PM   Subjective: Improved since patient but still having multiple issues.  The patient has had known coronary artery disease with angina exacerbated by recent infection and some chronic hypoxia requiring oxygenation.  The patient has had episodes every morning of some anginal symptoms will and some bradycardia typically surrounding going to the bathroom.  It appears that he may be getting vagal as well with some bradycardia into the 20 bpm range but no evidence of syncope just dizziness.  Cardiac catheterization showing minimal atherosclerosis of left anterior descending artery and circumflex with occluded right coronary artery with collateralization which appears to be old and not amenable to PCI and/or stent placement.  Therefore medication management is appropriate  Patient has been relatively hypotensive and therefore cannot use antianginal medications to the fullest amount  Review of Systems: Positive for: Shortness of breath chest pain Negative for: Vision change, hearing change, syncope, dizziness, nausea, vomiting,diarrhea, bloody stool, stomach pain, cough, congestion, diaphoresis, urinary frequency, urinary pain,skin lesions, skin rashes Others previously listed  Objective: Telemetry: Atrial fibrillation with controlled ventricular rate Physical Exam: Blood pressure 108/67, pulse 85, temperature 98.1 F (36.7 C), temperature source Oral, resp. rate (!) 21, height 6\' 2"  (1.88 m), weight 94.4 kg, SpO2 96 %. Body mass index is 26.72 kg/m. General: Well developed, well nourished, in no acute distress. Head: Normocephalic, atraumatic, sclera non-icteric, no xanthomas, nares are without discharge. Neck: No apparent masses Lungs: Normal respirations with no wheezes, no rhonchi, no rales , no crackles   Heart: irregular rate and rhythm, normal S1 S2, no  murmur, no rub, no gallop, PMI is normal size and placement, carotid upstroke normal without bruit, jugular venous pressure normal Abdomen: Soft, non-tender, non-distended with normoactive bowel sounds. No hepatosplenomegaly. Abdominal aorta is normal size without bruit Extremities: Trace edema, no clubbing, no cyanosis, no ulcers,  Peripheral: 2+ radial, 2+ femoral, 2+ dorsal pedal pulses Neuro: Alert and oriented. Moves all extremities spontaneously. Psych:  Responds to questions appropriately with a normal affect.   Intake/Output Summary (Last 24 hours) at 11/20/2020 1450 Last data filed at 11/20/2020 1200 Gross per 24 hour  Intake 853 ml  Output 1045 ml  Net -192 ml    Inpatient Medications:  . (feeding supplement) PROSource Plus  30 mL Oral TID BM  . sodium chloride   Intravenous Once  . apixaban  5 mg Oral BID  . aspirin EC  81 mg Oral Daily  . atorvastatin  40 mg Oral QPC supper  . Chlorhexidine Gluconate Cloth  6 each Topical Daily  . feeding supplement  1 Container Oral TID BM  . ferrous gluconate  324 mg Oral QPC supper  . guaiFENesin  1,200 mg Oral BID  . midodrine  10 mg Oral TID WC  . mometasone-formoterol  2 puff Inhalation BID  . multivitamin with minerals  1 tablet Oral Daily  . pantoprazole (PROTONIX) IV  40 mg Intravenous Q12H  . phenazopyridine  100 mg Oral Q8H  . sodium chloride flush  10-40 mL Intracatheter Q12H  . sodium chloride flush  3 mL Intravenous Q12H  . tamsulosin  0.4 mg Oral Daily   Infusions:  . sodium chloride    . sodium chloride    . [START ON 11/21/2020] cefTRIAXone (ROCEPHIN)  IV    . promethazine (PHENERGAN) injection (IM or IVPB)      Labs: Recent Labs  11/19/20 0503 11/20/20 0432  NA 131* 131*  K 4.3 5.4*  CL 95* 95*  CO2 28 29  GLUCOSE 117* 133*  BUN 50* 51*  CREATININE 1.88* 1.58*  CALCIUM 8.5* 8.5*  MG 2.6* 2.2   No results for input(s): AST, ALT, ALKPHOS, BILITOT, PROT, ALBUMIN in the last 72 hours. Recent Labs     11/19/20 0503 11/20/20 0432  WBC 8.9 12.1*  HGB 9.9* 9.5*  HCT 30.2* 30.4*  MCV 91.0 91.8  PLT 117* 114*   No results for input(s): CKTOTAL, CKMB, TROPONINI in the last 72 hours. Invalid input(s): POCBNP No results for input(s): HGBA1C in the last 72 hours.   Weights: Filed Weights   11/17/20 0500 11/19/20 0500 11/20/20 0437  Weight: 87.2 kg 94.5 kg 94.4 kg     Radiology/Studies:  DG Abd 1 View  Result Date: 11/18/2020 CLINICAL DATA:  Nausea and vomiting. EXAM: ABDOMEN - 1 VIEW COMPARISON:  10/20/2020. FINDINGS: Air is seen in the stomach. Gas in nondistended small bowel and colon. Gas in the rectum. Possible left renal stone. Coarsened basilar pulmonary markings. IMPRESSION: 1. No acute findings. 2. Possible left renal stone. 3. Coarsened basilar pulmonary markings. Electronically Signed   By: Lorin Picket M.D.   On: 11/18/2020 10:35   CT HEAD WO CONTRAST (5MM)  Result Date: 11/05/2020 CLINICAL DATA:  Transient ischemic attack (TIA) EXAM: CT HEAD WITHOUT CONTRAST TECHNIQUE: Contiguous axial images were obtained from the base of the skull through the vertex without intravenous contrast. COMPARISON:  None. FINDINGS: Brain: There is no acute intracranial hemorrhage, mass effect, or edema. Gray-white differentiation is preserved. There is no extra-axial fluid collection. Ventricles and sulci are within normal limits in size and configuration. Vascular: There is atherosclerotic calcification at the skull base. Skull: Calvarium is unremarkable. Sinuses/Orbits: No acute finding. Other: None. IMPRESSION: No acute intracranial abnormality. Electronically Signed   By: Macy Mis M.D.   On: 11/05/2020 14:20   CT ABDOMEN PELVIS W CONTRAST  Result Date: 11/03/2020 CLINICAL DATA:  Nausea and vomiting EXAM: CT ABDOMEN AND PELVIS WITH CONTRAST TECHNIQUE: Multidetector CT imaging of the abdomen and pelvis was performed using the standard protocol following bolus administration of  intravenous contrast. CONTRAST:  122mL OMNIPAQUE IOHEXOL 300 MG/ML  SOLN COMPARISON:  CT abdomen and pelvis dated October 20, 2020 FINDINGS: Lower chest: Cardiomegaly. Emphysema. Solid pulmonary nodules of the left lower lobe. Largest measures up to 7 mm and is located on series 2, image 98. Hepatobiliary: No focal liver abnormality is seen. No gallstones, gallbladder wall thickening, or biliary dilatation. Pancreas: Unremarkable. No pancreatic ductal dilatation or surrounding inflammatory changes. Spleen: Normal in size without focal abnormality. Adrenals/Urinary Tract: Bilateral adrenal glands are unremarkable. No hydronephrosis. Bilateral nonobstructing stones, left greater than right. Bladder is unremarkable. Stomach/Bowel: Stomach is within normal limits. Appendix appears normal. Diverticulosis. No evidence of bowel wall thickening, distention, or inflammatory changes. Vascular/Lymphatic: Aortic atherosclerosis. No enlarged abdominal or pelvic lymph nodes. Reproductive: Prostatomegaly, measuring up to 5.8 cm. Other: Small right greater than left fat containing inguinal hernias. Musculoskeletal: Unchanged L4 compression deformity. No aggressive appearing osseous lesions. IMPRESSION: Diverticulosis with no evidence of diverticulitis. No acute findings abdomen or pelvis. Solid pulmonary nodules of the left lower lobe, largest measures up to 7 mm. Non-contrast chest CT at 6-12 months is recommended. If the nodule is stable at time of repeat CT, then future CT at 18-24 months (from today's scan) is considered optional for low-risk patients, but is recommended for high-risk patients. This  recommendation follows the consensus statement: Guidelines for Management of Incidental Pulmonary Nodules Detected on CT Images: From the Fleischner Society 2017; Radiology 2017; 284:228-243. Aortic Atherosclerosis (ICD10-I70.0) and Emphysema (ICD10-J43.9). Electronically Signed   By: Yetta Glassman M.D.   On: 11/03/2020 15:48    CARDIAC CATHETERIZATION  Result Date: 11/14/2020 .  Ost RCA lesion is 95% stenosed. .  Prox RCA to Dist RCA lesion is 100% stenosed. -Very minimal flow.  After initial angiography, it disappeared to progressed to the ostium. .  Left-to-right collaterals via septal perforators fill the PDA system. .  Mid LM to Dist LM lesion is 20% stenosed. Colon Flattery LAD to Prox LAD lesion is 25% stenosed.  Relatively small caliber vessel that wraps the apex. .  Diminutive LCx with moderate sized OM1, mild disease. .  The left ventricular systolic function is normal. No obvious regional wall motion normalities. .  The left ventricular ejection fraction is 55-65% by visual estimate. .  LV end diastolic pressure is moderately elevated.  18 mmHg .  There is no aortic valve stenosis. SUMMARY Cardiogenic shock-SCAI scale A-B => not favorable for mechanical support due to ongoing anemia, lack of active anginal symptoms and preserved EF with minimally elevated LVEDP. Severe single-vessel CAD with 95% ostial RCA and 1% flush occlusion of the proximal RCA- Unable to engage with guide catheter to wire the vessel. Does not correlate with anterior elevations and no wall motion normalities Minimal diffuse LAD disease with left-to-right collaterals filling the PDA and part of the PL system. Preserved LVEF of 50 to 55% with no obvious wall motion abnormalities. Successful Right IJ Triple-Lumen Catheter placed RECOMMENDATIONS Would not treat with antiplatelet agent given total occluded vessel and need for DOAC for A. fib.  Hold off on anticoagulation until hemodynamically stable Continue to titrate Levophed and dopamine for blood pressure support Transfuse to keep hemoglobin greater than 10 Consider possibility of RV involvement as potential cause of shock. Glenetta Hew, MD  US Venous Img Lower Bilateral (DVT)  Result Date: 11/11/2020 CLINICAL DATA:  Shortness of breath EXAM: Bilateral LOWER EXTREMITY VENOUS DOPPLER ULTRASOUND TECHNIQUE:  Gray-scale sonography with compression, as well as color and duplex ultrasound, were performed to evaluate the deep venous system(s) from the level of the common femoral vein through the popliteal and proximal calf veins. COMPARISON:  None. FINDINGS: VENOUS Normal compressibility of the common femoral, superficial femoral, and popliteal veins, as well as the visualized calf veins. Visualized portions of profunda femoral vein and great saphenous vein unremarkable. No filling defects to suggest DVT on grayscale or color Doppler imaging. Doppler waveforms show normal direction of venous flow, normal respiratory plasticity and response to augmentation. Limited views of the contralateral common femoral vein are unremarkable. OTHER None. Limitations: none IMPRESSION: Negative. Electronically Signed   By: Merilyn Baba M.D.   On: 11/11/2020 18:07   DG Chest Port 1 View  Result Date: 11/15/2020 CLINICAL DATA:  Acute respiratory failure with hypoxia EXAM: PORTABLE CHEST 1 VIEW COMPARISON:  Chest radiograph from one day prior. FINDINGS: Right internal jugular central venous catheter terminates at the cavoatrial junction. Pacer pads overlie the left chest. Stable cardiomediastinal silhouette with mild cardiomegaly. No pneumothorax. No pleural effusion. Mild pulmonary edema, similar. Mild to moderate bibasilar atelectasis, worsened. IMPRESSION: 1. Stable mild congestive heart failure. 2. Mild to moderate bibasilar atelectasis, worsened. Electronically Signed   By: Ilona Sorrel M.D.   On: 11/15/2020 07:08   DG Chest Port 1 View  Result Date: 11/14/2020 CLINICAL  DATA:  Central line placement. Hypoxia. Congestive heart failure. EXAM: PORTABLE CHEST 1 VIEW COMPARISON:  Prior today FINDINGS: A new right jugular central venous catheter is seen with tip overlying the proximal right atrium. No evidence of pneumothorax. Mild cardiomegaly stable. Diffuse interstitial infiltrates remains stable. Loculated fluid within the left  major fissure is stable, and mild atelectasis or scarring is again seen in the right upper lobe. IMPRESSION: New right jugular central venous catheter tip overlies the proximal right atrium. No evidence of pneumothorax. Stable cardiomegaly and diffuse interstitial infiltrates/edema. Stable small loculated left pleural effusion, and right upper lobe atelectasis versus scarring. Electronically Signed   By: Marlaine Hind M.D.   On: 11/14/2020 14:11   DG Chest Port 1 View  Result Date: 11/14/2020 CLINICAL DATA:  Hypoxia EXAM: PORTABLE CHEST 1 VIEW COMPARISON:  11/11/2020 chest radiograph. FINDINGS: Stable cardiomediastinal silhouette with mild cardiomegaly. No pneumothorax. Focal opacity overlying the peripheral mid to upper left lung appears more discrete. No right pleural effusion. Similar mild pulmonary edema. Stable small focal lung opacity in the peripheral upper right lung. IMPRESSION: 1. Similar mild congestive heart failure. 2. Focal opacity overlying the peripheral mid to upper left lung appears more discrete. Stable smaller focal peripheral upper right lung opacity. These may represent loculated pleural effusions and/or pulmonary nodules. Suggest further evaluation with noncontrast chest CT. Electronically Signed   By: Ilona Sorrel M.D.   On: 11/14/2020 07:26   DG Chest Portable 1 View  Result Date: 11/11/2020 CLINICAL DATA:  Shortness of breath EXAM: PORTABLE CHEST 1 VIEW COMPARISON:  Previous studies including the examination of 10/29/2020 FINDINGS: Transverse diameter of heart is slightly increased. Thoracic aorta is tortuous and ectatic. There is prominence of interstitial markings in the parahilar regions with interval worsening. There is faint haziness in the lateral aspect of left mid and both lower lung fields. There is a small smooth marginated radiopacity, possibly in the lateral aspect of minor fissure in the right mid lung fields. There is pleural thickening in both apices, more so on the  left side. There is no pneumothorax. Lateral CP angles are clear. IMPRESSION: There is increase in interstitial markings in the parahilar regions suggesting interstitial edema or interstitial pneumonitis. Part of this finding is probably underlying scarring. There is diffuse haziness in the lateral aspect of left mid and both lower lung fields which may be due to pleural thickening or loculated pleural effusions or pneumonia. Follow-up chest radiographs and CT as clinically warranted should be considered. Electronically Signed   By: Elmer Picker M.D.   On: 11/11/2020 13:38   DG Chest Port 1 View  Result Date: 10/29/2020 CLINICAL DATA:  Shortness of breath EXAM: PORTABLE CHEST 1 VIEW COMPARISON:  10/27/2020, 10/23/2020, 08/27/2017,, CT 09/19/2018 FINDINGS: Emphysematous disease. Mild reticular opacity at the bases likely due to fibrosis. Slight decreased interstitial opacity since 10/27/2020. No new confluent airspace disease, pleural effusion or pneumothorax. Apical pleural thickening. Stable cardiomediastinal silhouette with aortic atherosclerosis. IMPRESSION: 1. Underlying chronic lung disease/emphysema with overall decreased interstitial opacity since 10/27/2020 suggesting resolving superimposed edema or inflammatory process 2. No new confluent airspace disease Electronically Signed   By: Donavan Foil M.D.   On: 10/29/2020 15:39   DG Chest Port 1 View  Result Date: 10/27/2020 CLINICAL DATA:  Chronic hypoxemia, COPD presents with weakness and shortness of breath EXAM: PORTABLE CHEST 1 VIEW COMPARISON:  Chest radiograph 10/23/2020 FINDINGS: The cardiomediastinal silhouette is stable. There are diffusely coarsened interstitial markings throughout both lungs, overall not significantly  changed since 10/23/2020. There is no new or worsening focal airspace disease. There is no pleural effusion. There is no pneumothorax. There is no acute osseous abnormality. IMPRESSION: Unchanged coarsened interstitial  markings throughout both lungs since 10/23/2020 though these are increased since 10/06/2020. Findings may reflect edema or infection superimposed on chronic changes. Electronically Signed   By: Valetta Mole M.D.   On: 10/27/2020 09:05   DG Chest Port 1 View  Result Date: 10/23/2020 CLINICAL DATA:  Respiratory failure, increasing shortness of breath EXAM: PORTABLE CHEST 1 VIEW COMPARISON:  10/20/2020 FINDINGS: Unchanged mildly enlarged cardiac contour. Redemonstrated diffuse airspace and interstitial opacities bilaterally, which appear largely unchanged compared to the prior exam. No definite pleural effusion. Aortic calcifications. No acute osseous abnormality. IMPRESSION: Unchanged bilateral pulmonary opacities, which could represent edema or infection. Electronically Signed   By: Merilyn Baba M.D.   On: 10/23/2020 17:49   ECHOCARDIOGRAM COMPLETE  Result Date: 11/15/2020    ECHOCARDIOGRAM REPORT   Patient Name:   ADRIANE GUGLIELMO Date of Exam: 11/15/2020 Medical Rec #:  [id number]     Height:       74.0 in Accession #:    [id number]    Weight:       190.5 lb Date of Birth:  10-04-1940     BSA:          2.129 m Patient Age:    56 years      BP:           105/60 mmHg Patient Gender: M             HR:           81 bpm. Exam Location:  [facility] Procedure: 2D Echo Indications:     Myocardial Infarct I21.9  History:         Patient has prior history of Echocardiogram examinations, most                  recent 10/23/2020.  Sonographer:     Kathlen Brunswick RDCS Referring Phys:  [place] Diagnosing Phys: [place]  1. Left ventricular ejection fraction, by estimation, is 60 to 65%. The left ventricle has normal function. The left ventricle demonstrates regional wall motion abnormalities (see scoring diagram/findings for description). Left ventricular diastolic parameters are consistent with Grade I diastolic dysfunction (impaired relaxation).  2. Right ventricular systolic function is severely  reduced. The right ventricular size is severely enlarged.  3. Left atrial size was severely dilated.  4. Right atrial size was severely dilated.  5. The mitral valve is normal in structure. Mild mitral valve regurgitation. No evidence of mitral stenosis. Moderate mitral annular calcification.  6. The aortic valve is normal in structure. Aortic valve regurgitation is not visualized. Mild aortic valve sclerosis is present, with no evidence of aortic valve stenosis.  7. The inferior vena cava is normal in size with greater than 50% respiratory variability, suggesting right atrial pressure of 3 mmHg. FINDINGS  Left Ventricle: Left ventricular ejection fraction, by estimation, is 60 to 65%. The left ventricle has normal function. The left ventricle demonstrates regional wall motion abnormalities. The left ventricular internal cavity size was normal in size. There is no left ventricular hypertrophy. Left ventricular diastolic parameters are consistent with Grade I diastolic dysfunction (impaired relaxation). Right Ventricle: The right ventricular size is severely enlarged. No increase in right ventricular wall thickness. Right ventricular systolic function is severely reduced. Left Atrium: Left atrial size was severely dilated.  Right Atrium: Right atrial size was severely dilated. Pericardium: There is no evidence of pericardial effusion. Mitral Valve: The mitral valve is normal in structure. Moderate mitral annular calcification. Mild mitral valve regurgitation. No evidence of mitral valve stenosis. Tricuspid Valve: The tricuspid valve is normal in structure. Tricuspid valve regurgitation is mild . No evidence of tricuspid stenosis. Aortic Valve: The aortic valve is normal in structure. Aortic valve regurgitation is not visualized. Mild aortic valve sclerosis is present, with no evidence of aortic valve stenosis. Aortic valve peak gradient measures 5.5 mmHg. Pulmonic Valve: The pulmonic valve was normal in structure.  Pulmonic valve regurgitation is not visualized. No evidence of pulmonic stenosis. Aorta: The aortic root is normal in size and structure. Venous: The inferior vena cava is normal in size with greater than 50% respiratory variability, suggesting right atrial pressure of 3 mmHg. IAS/Shunts: No atrial level shunt detected by color flow Doppler.  LEFT VENTRICLE PLAX 2D LVIDd:         3.74 cm   Diastology LVIDs:         2.69 cm   LV e' medial:    5.77 cm/s LV PW:         1.26 cm   LV E/e' medial:  20.8 LV IVS:        1.18 cm   LV e' lateral:   7.83 cm/s LVOT diam:     1.80 cm   LV E/e' lateral: 15.3 LV SV:         29 LV SV Index:   14 LVOT Area:     2.54 cm  RIGHT VENTRICLE RV Basal diam:  4.32 cm RV S prime:     4.79 cm/s TAPSE (M-mode): 1.1 cm LEFT ATRIUM             Index        RIGHT ATRIUM           Index LA diam:        3.90 cm 1.83 cm/m   RA Area:     18.80 cm LA Vol (A2C):   42.4 ml 19.92 ml/m  RA Volume:   56.50 ml  26.54 ml/m LA Vol (A4C):   36.0 ml 16.91 ml/m LA Biplane Vol: 40.7 ml 19.12 ml/m  AORTIC VALVE                 PULMONIC VALVE AV Area (Vmax): 1.65 cm     PV Vmax:       0.94 m/s AV Vmax:        117.00 cm/s  PV Peak grad:  3.5 mmHg AV Peak Grad:   5.5 mmHg LVOT Vmax:      75.80 cm/s LVOT Vmean:     44.200 cm/s LVOT VTI:       0.113 m  AORTA Ao Root diam: 3.20 cm Ao Asc diam:  2.70 cm MITRAL VALVE                TRICUSPID VALVE MV Area (PHT): 3.76 cm     TV Peak grad:   17.4 mmHg MV Decel Time: 202 msec     TV Vmax:        2.08 m/s MV E velocity: 120.00 cm/s                             SHUNTS  Systemic VTI:  0.11 m                             Systemic Diam: 1.80 cm Neoma Laming Electronically signed by Neoma Laming Signature Date/Time: 11/15/2020/10:38:00 AM    Final    ECHOCARDIOGRAM COMPLETE  Result Date: 10/23/2020    ECHOCARDIOGRAM REPORT   Patient Name:   WLLIAM GROSSO Date of Exam: 10/23/2020 Medical Rec #:  [id number]     Height:       74.0 in Accession #:     [id number]    Weight:       180.0 lb Date of Birth:  [birth year]     BSA:          2.078 m Patient Age:    70 years      BP:           133/80 mmHg Patient Gender: M             HR:           95 bpm. Exam Location:  [facility] Procedure: 2D Echo, Cardiac Doppler and Color Doppler Indications:     CHF-acute diastolic X93.71  History:         Patient has prior history of Echocardiogram examinations, most                  recent 04/20/2015. Angina, COPD and TIA; Signs/Symptoms:Syncope.  Sonographer:     Sherrie Sport Referring Phys:  [id number] [place] Diagnosing Phys: Ida Rogue MD  Sonographer Comments: Suboptimal apical window. IMPRESSIONS  1. Left ventricular ejection fraction, by estimation, is 60 to 65%. The left ventricle has normal function. The left ventricle has no regional wall motion abnormalities. Left ventricular diastolic parameters are indeterminate.  2. Right ventricular systolic function is normal. The right ventricular size is normal. There is mildly elevated pulmonary artery systolic pressure.  3. Rhythm is atrial fibrillation  4. Left atrial size was moderately dilated. FINDINGS  Left Ventricle: Left ventricular ejection fraction, by estimation, is 60 to 65%. The left ventricle has normal function. The left ventricle has no regional wall motion abnormalities. The left ventricular internal cavity size was normal in size. There is  no left ventricular hypertrophy. Left ventricular diastolic parameters are indeterminate. Right Ventricle: The right ventricular size is normal. No increase in right ventricular wall thickness. Right ventricular systolic function is normal. There is mildly elevated pulmonary artery systolic pressure. The tricuspid regurgitant velocity is 2.95  m/s, and with an assumed right atrial pressure of 5 mmHg, the estimated right ventricular systolic pressure is 81.0 mmHg. Left Atrium: Left atrial size was moderately dilated. Right Atrium: Right atrial size was normal in size.  Pericardium: There is no evidence of pericardial effusion. Mitral Valve: The mitral valve is normal in structure. There is mild thickening of the mitral valve leaflet(s). No evidence of mitral valve regurgitation. No evidence of mitral valve stenosis. Tricuspid Valve: The tricuspid valve is normal in structure. Tricuspid valve regurgitation is mild . No evidence of tricuspid stenosis. Aortic Valve: The aortic valve is normal in structure. Aortic valve regurgitation is not visualized. No aortic stenosis is present. Aortic valve mean gradient measures 2.0 mmHg. Aortic valve peak gradient measures 4.7 mmHg. Aortic valve area, by VTI measures 3.04 cm. Pulmonic Valve: The pulmonic valve was normal in structure. Pulmonic valve regurgitation is not visualized. No evidence of pulmonic stenosis. Aorta: The aortic root is normal in  size and structure. Venous: The inferior vena cava is normal in size with greater than 50% respiratory variability, suggesting right atrial pressure of 3 mmHg. IAS/Shunts: No atrial level shunt detected by color flow Doppler.  LEFT VENTRICLE PLAX 2D LVIDd:         4.19 cm LVIDs:         2.93 cm LV PW:         1.33 cm LV IVS:        0.95 cm LVOT diam:     2.00 cm LV SV:         55 LV SV Index:   26 LVOT Area:     3.14 cm  RIGHT VENTRICLE RV Basal diam:  4.55 cm RV S prime:     7.51 cm/s TAPSE (M-mode): 3.8 cm LEFT ATRIUM              Index        RIGHT ATRIUM           Index LA diam:        3.50 cm  1.68 cm/m   RA Area:     26.60 cm LA Vol (A2C):   93.4 ml  44.94 ml/m  RA Volume:   96.30 ml  46.34 ml/m LA Vol (A4C):   128.0 ml 61.59 ml/m LA Biplane Vol: 113.0 ml 54.37 ml/m  AORTIC VALVE                    PULMONIC VALVE AV Area (Vmax):    2.83 cm     PV Vmax:        0.96 m/s AV Area (Vmean):   2.84 cm     PV Peak grad:   3.6 mmHg AV Area (VTI):     3.04 cm     RVOT Peak grad: 4 mmHg AV Vmax:           108.00 cm/s AV Vmean:          70.100 cm/s AV VTI:            0.181 m AV Peak Grad:       4.7 mmHg AV Mean Grad:      2.0 mmHg LVOT Vmax:         97.30 cm/s LVOT Vmean:        63.300 cm/s LVOT VTI:          0.175 m LVOT/AV VTI ratio: 0.97  AORTA Ao Root diam: 3.30 cm MITRAL VALVE                TRICUSPID VALVE MV Area (PHT): 4.52 cm     TR Peak grad:   34.8 mmHg MV Decel Time: 168 msec     TR Vmax:        295.00 cm/s MV E velocity: 134.00 cm/s                             SHUNTS                             Systemic VTI:  0.18 m                             Systemic Diam: 2.00 cm Ida Rogue MD Electronically signed by Ida Rogue MD Signature Date/Time: 10/23/2020/5:30:24 PM    Final  Assessment and Recommendation  80 y.o. male with known coronary artery disease with occluded right coronary artery with collateralization and not amenable to PCI and stent placement with some hypotension infection and non-ST elevation myocardial infarction having continued episodes of chest discomfort and bradycardia with chronic nonvalvular atrial fibrillation dating continued medication management 1.  Patient is continuing midodrine for hypotension but will consider the possibility of addition of isosorbide mononitrate 30 mg each day for further treatment of anginal symptoms if they recur to improve collateralization and treatment of possible stable ischemia to the inferior wall of the myocardium 2.  Avoid any beta-blocker or heart rate regulating medication management due to concerns of mild sick sinus syndrome not symptomatic at this time mainly in the setting of vagal maneuvers including urination and defecation 3.  Continuation of anticoagulation for further risk reduction and stroke with atrial fibrillation 4.  Continuation of supportive care for infection and hypoxia as per medicine 5.  Continue aspirin for further risk reduction and myocardial infarction and/or coronary artery disease with single antiplatelet therapy due to concerns of bleeding complications with concomitant use of  anticoagulation 6.  High intensity cholesterol therapy 7.  No further cardiac diagnostics necessary at this time while continuing to treat above  Signed, Serafina Royals M.D. FACC

## 2020-11-20 NOTE — Progress Notes (Signed)
SUBJECTIVE: [name] Browning is a 80 year old male with history of CAD (nonobstructive calcified coronary disease), atrial fibrillation on Eliquis, HFpEF, hypertension, TIA in 2016, COPD on 2 L who was admitted to the hospital on 11/2 with shortness of breath.   Cardiac catheterization on 11/14/20 showed no significant obstruction in the LAD with occluded RCA and collaterals from LAD to RCA.  It appeared that RCA was chronically occluded.   Patient had an episode of chest pain this morning  while out of bed using bedside commode. EKG unchanged. HR dropped to 20's. Received fluids with improvement.   Currently denies chest pain. Intermittent shortness of breath.    Vitals:   11/20/20 0700 11/20/20 0800 11/20/20 0900 11/20/20 1000  BP: 104/68 103/62 105/67 112/78  Pulse: 92 84 (!) 57 82  Resp: 19 18 (!) 26 (!) 23  Temp:  98 F (36.7 C)    TempSrc:  Oral    SpO2: 95% 94% 97% 96%  Weight:      Height:        Intake/Output Summary (Last 24 hours) at 11/20/2020 1149 Last data filed at 11/20/2020 1000 Gross per 24 hour  Intake 753 ml  Output 925 ml  Net -172 ml    LABS: Basic Metabolic Panel: Recent Labs    11/19/20 0503 11/20/20 0432  NA 131* 131*  K 4.3 5.4*  CL 95* 95*  CO2 28 29  GLUCOSE 117* 133*  BUN 50* 51*  CREATININE 1.88* 1.58*  CALCIUM 8.5* 8.5*  MG 2.6* 2.2   Liver Function Tests: No results for input(s): AST, ALT, ALKPHOS, BILITOT, PROT, ALBUMIN in the last 72 hours. No results for input(s): LIPASE, AMYLASE in the last 72 hours. CBC: Recent Labs    11/19/20 0503 11/20/20 0432  WBC 8.9 12.1*  HGB 9.9* 9.5*  HCT 30.2* 30.4*  MCV 91.0 91.8  PLT 117* 114*   Cardiac Enzymes: No results for input(s): CKTOTAL, CKMB, CKMBINDEX, TROPONINI in the last 72 hours. BNP: Invalid input(s): POCBNP D-Dimer: No results for input(s): DDIMER in the last 72 hours. Hemoglobin A1C: No results for input(s): HGBA1C in the last 72 hours. Fasting Lipid Panel: No results for  input(s): CHOL, HDL, LDLCALC, TRIG, CHOLHDL, LDLDIRECT in the last 72 hours. Thyroid Function Tests: No results for input(s): TSH, T4TOTAL, T3FREE, THYROIDAB in the last 72 hours.  Invalid input(s): FREET3 Anemia Panel: No results for input(s): VITAMINB12, FOLATE, FERRITIN, TIBC, IRON, RETICCTPCT in the last 72 hours.   PHYSICAL EXAM General: Well developed, well nourished, in no acute distress HEENT:  Normocephalic and atramatic Neck:  No JVD.  Lungs: Clear bilaterally to auscultation and percussion. Heart: HRRR . Normal S1 and S2 without gallops or murmurs.  Abdomen: Bowel sounds are positive, abdomen soft and non-tender  Msk:  Back normal, normal gait. Normal strength and tone for age. Extremities: No clubbing, cyanosis or edema.   Neuro: Alert and oriented X 3. Psych:  Good affect, responds appropriately  TELEMETRY: atrial fibrillation, HR 78 bpm  ASSESSMENT AND PLAN: Patient resting in bed, asymptomatic. Vitals stable. Plan to work with PT this afternoon. Will continue to observe.  Principal Problem:   Acute on chronic diastolic CHF (congestive heart failure) (HCC) Active Problems:   HTN (hypertension)   COPD, moderate (HCC)   Acute on chronic respiratory failure with hypoxia (HCC)   Atrial fibrillation (HCC)   DVT (deep venous thrombosis) (HCC)   HLD (hyperlipidemia)   Stroke [facility])   CAD (coronary artery disease)   NSTEMI (  non-ST elevated myocardial infarction) (HCC)   Hypokalemia   Thrombocytopenia (HCC)   Iron deficiency anemia   GI bleeding   Acute ST elevation myocardial infarction (STEMI) of anterior wall (HCC)   Cardiogenic shock (HCC)   Hemorrhagic shock ([place])    [name] Gibbard, FNP-C 11/20/2020 11:49 AM

## 2020-11-20 NOTE — Progress Notes (Addendum)
PROGRESS NOTE    [name] Browning  ZOX:[id number] DOB: [birth year] DOA: 11/11/2020 PCP: [place]  [id number]   Assessment & Plan:   Principal Problem:   Acute on chronic diastolic CHF (congestive heart failure) (HCC) Active Problems:   HTN (hypertension)   COPD, moderate (HCC)   Acute on chronic respiratory failure with hypoxia (HCC)   Atrial fibrillation (HCC)   DVT (deep venous thrombosis) (HCC)   HLD (hyperlipidemia)   Stroke (HCC)   CAD (coronary artery disease)   NSTEMI (non-ST elevated myocardial infarction) (HCC)   Hypokalemia   Thrombocytopenia (HCC)   Iron deficiency anemia   GI bleeding   Acute ST elevation myocardial infarction (STEMI) of anterior wall (HCC)   Cardiogenic shock ([place])   Hemorrhagic shock [facility])   Mr Watling is a 80 year old male with history of CAD (nonobstructive calcified coronary disease), atrial fibrillation on Eliquis, HFpEF, hypertension, TIA in 2016, COPD on 2 L who was admitted to the hospital on 11/2 with shortness of breath.  He is admitted to the hospital 2 times in the last month.   First admission was from 10/20/2020 to 10/31/2020 when he was admitted with respiratory failure due to COPD and pneumonia, as well as an obstructive left renal stone.  He developed hypotension and worsening respiratory distress requiring pressors and BiPAP.     Blood cultures grew positive for Enterococcus faecalis and staph hemolyticus.  He was treated with antibiotics ending 11/03/2020.  The patient was recently admitted to the hospital from 10/25-10/28 with complaints of dysphagia.     He was seen by gastroenterology who underwent an EGD with no significant findings.  Since that time he has had progressive worsening shortness of breath, eventually presented to the emergency department where he was found to have an oxygen saturation of 88% on his home 2 L of oxygen.    Recurrent chest pain, likely anginal  --brief episodes, responsive to SL  nitro --start Imdur 15 mg by cardiology  Bradycardia, intermittent --mild sick sinus syndrome not symptomatic at this time mainly in the setting of vagal maneuvers including urination and defecation. --avoid beta-blocker or other rate control agents  ACUTE on chronic hypoxic respiratory failure on 2L at baseline --2-4L requirement --Continue supplemental O2 to keep sats between 88-92%   Acute Anterior STEMI  Hx of Ischemic Cardiomyopathy - CODE STEMI CALLED 11/5 with ST elevations V2,V3.  cardiac catheterization showed no significant obstruction in the LAD with occluded RCA and collaterals from LAD to RCA.  It appeared that RCA was chronically occluded. No intervention. --s/p heparin gtt Plan: --cont ASA and statin --cont to hold beta blocker due to hypotension and intermittent bradycardia --start Imdur 15 mg by cardiology  Acute on chronic diastolic CHF Echo 81/1: LVEF 60-65%, LV regional wall motion abnormalities, Grade 1 diastolic dysfunction, RV systolic function severely reduced with RV severe enlargement, LA + RA severely dilated --s/p IV lasix diuresis   CARDIOGENIC SHOCK, resolved Hypotension, improved --weaned off Levophed and dopamine as of morning of 11/9 --cont midodrine 10 mg TID   Anemia, acute on chronic Iron deficiency versus ? Acute blood loss secondary to GI bleed Presented with dark-colored stools and stool occult positive, noted drop in hgb which normalized s/p pRBC transfusion.  - GI following --s/p 2u pRBC Plan: --no plan for endoscopic procedures at this time, per GI --switch from IV to oral protonix   ACUTE KIDNEY INJURY --dehydration, contrast.  Cr improved with gentle IVF hydration --encourage oral hydration  COPD --  cont daily bronchodilators  Dysuria --started on ceftriaxone today by night team --add on urine cx   DVT prophylaxis: VZ:CHYIFOY Code Status: Full code  Family Communication:  Level of care: stepdown Dispo:   The patient is  from: home Anticipated d/c is to: home with [facility] (pt and family refused SNF rehab)  Anticipated d/c date is: 1-2 days Patient currently is not medically ready to d/c due to: still having chest pain, with recent STEMI   Subjective and Interval History:  Pt had another chest pain event with brief bradycardia this morning while having a BM.  Chest pain lasted about 5 min and then resolved.  Also complained of dysuria which pt said had resolved.    Dyspne improved.  Nausea improved and pt reported eating a bit more.  [facility] cardiology requested to take over seeing the pt today.   Objective: Vitals:   11/20/20 1000 11/20/20 1100 11/20/20 1200 11/20/20 1300  BP: 112/78 113/61 108/70 108/67  Pulse: 82 89 82 85  Resp: (!) 23 (!) 23 (!) 21 (!) 21  Temp:   98.1 F (36.7 C)   TempSrc:   Oral   SpO2: 96% 97% 97% 96%  Weight:      Height:        Intake/Output Summary (Last 24 hours) at 11/20/2020 1811 Last data filed at 11/20/2020 1200 Gross per 24 hour  Intake 613 ml  Output 795 ml  Net -182 ml   Filed Weights   11/17/20 0500 11/19/20 0500 11/20/20 0437  Weight: 87.2 kg 94.5 kg 94.4 kg    Examination:   Constitutional: NAD, AAOx3 HEENT: conjunctivae and lids normal, EOMI CV: No cyanosis.   RESP: normal respiratory effort, crackles at bilateral bases, on 2L Extremities: 2+ pitting edema in BLE SKIN: warm, dry Neuro: II - XII grossly intact.   Psych: subdued mood and affect.  Appropriate judgement and reason   Data Reviewed: I have personally reviewed following labs and imaging studies  CBC: Recent Labs  Lab 11/14/20 0945 11/14/20 1342 11/17/20 0514 11/17/20 2315 11/18/20 0506 11/19/20 0503 11/20/20 0432  WBC 19.3*   < > 9.0 9.4 8.6 8.9 12.1*  NEUTROABS 17.7*  --   --   --   --   --   --   HGB 9.3*   < > 8.6* 9.9* 9.9* 9.9* 9.5*  HCT 30.0*   < > 27.6* 30.9* 30.6* 30.2* 30.4*  MCV 95.5   < > 91.7 91.7 89.5 91.0 91.8  PLT 208   < > 141* 133* 140* 117* 114*   < > =  values in this interval not displayed.   Basic Metabolic Panel: Recent Labs  Lab 11/14/20 0710 11/14/20 0945 11/15/20 0401 11/16/20 0510 11/17/20 0514 11/18/20 0506 11/19/20 0503 11/20/20 0432  NA 138   < > 138 135 132* 133* 131* 131*  K 4.1   < > 4.3 4.0 3.8 4.1 4.3 5.4*  CL 99   < > 102 98 96* 96* 95* 95*  CO2 27   < > 27 28 28 28 28 29   GLUCOSE 85   < > 145* 133* 114* 120* 117* 133*  BUN 33*   < > 45* 44* 44* 56* 50* 51*  CREATININE 1.49*   < > 2.17* 2.01* 1.67* 2.28* 1.88* 1.58*  CALCIUM 8.3*   < > 7.9* 8.1* 8.2* 8.6* 8.5* 8.5*  MG 1.8  --  2.5* 2.3  --   --  2.6* 2.2  PHOS 3.2  --  5.1* 4.5 4.1  --   --   --    < > = values in this interval not displayed.   GFR: Estimated Creatinine Clearance: 44.1 mL/min (A) (by C-G formula based on SCr of 1.58 mg/dL (H)). Liver Function Tests: Recent Labs  Lab 11/14/20 0710 11/15/20 0401 11/16/20 0510 11/17/20 0514  AST 156*  --   --   --   ALT 170*  --   --   --   ALKPHOS 90  --   --   --   BILITOT 1.6*  --   --   --   PROT 5.1*  --   --   --   ALBUMIN 2.5* 3.0* 3.1* 2.8*   No results for input(s): LIPASE, AMYLASE in the last 168 hours. No results for input(s): AMMONIA in the last 168 hours. Coagulation Profile: No results for input(s): INR, PROTIME in the last 168 hours. Cardiac Enzymes: No results for input(s): CKTOTAL, CKMB, CKMBINDEX, TROPONINI in the last 168 hours. BNP (last 3 results) No results for input(s): PROBNP in the last 8760 hours. HbA1C: No results for input(s): HGBA1C in the last 72 hours. CBG: Recent Labs  Lab 11/14/20 0611 11/20/20 0608  GLUCAP 100* 111*   Lipid Profile: No results for input(s): CHOL, HDL, LDLCALC, TRIG, CHOLHDL, LDLDIRECT in the last 72 hours. Thyroid Function Tests: No results for input(s): TSH, T4TOTAL, FREET4, T3FREE, THYROIDAB in the last 72 hours. Anemia Panel: No results for input(s): VITAMINB12, FOLATE, FERRITIN, TIBC, IRON, RETICCTPCT in the last 72 hours. Sepsis  Labs: Recent Labs  Lab 11/14/20 1342 11/14/20 1616 11/14/20 1837 11/15/20 0401  LATICACIDVEN 4.1* 3.2* 2.9* 1.9    Recent Results (from the past 240 hour(s))  Resp Panel by RT-PCR (Flu A&B, Covid) Nasopharyngeal Swab     Status: None   Collection Time: 11/11/20  1:40 PM   Specimen: Nasopharyngeal Swab; Nasopharyngeal(NP) swabs in vial transport medium  Result Value Ref Range Status   SARS Coronavirus 2 by RT PCR NEGATIVE NEGATIVE Final    Comment: (NOTE) SARS-CoV-2 target nucleic acids are NOT DETECTED.  The SARS-CoV-2 RNA is generally detectable in upper respiratory specimens during the acute phase of infection. The lowest concentration of SARS-CoV-2 viral copies this assay can detect is 138 copies/mL. A negative result does not preclude SARS-Cov-2 infection and should not be used as the sole basis for treatment or other patient management decisions. A negative result may occur with  improper specimen collection/handling, submission of specimen other than nasopharyngeal swab, presence of viral mutation(s) within the areas targeted by this assay, and inadequate number of viral copies(<138 copies/mL). A negative result must be combined with clinical observations, patient history, and epidemiological information. The expected result is Negative.  Fact Sheet for Patients:  EntrepreneurPulse.com.au  Fact Sheet for Healthcare Providers:  IncredibleEmployment.be  This test is no t yet approved or cleared by the [place] FDA and  has been authorized for detection and/or diagnosis of SARS-CoV-2 by FDA under an Emergency Use Authorization (EUA). This EUA will remain  in effect (meaning this test can be used) for the duration of the COVID-19 declaration under Section 564(b)(1) of the Act, 21 U.S.C.section 360bbb-3(b)(1), unless the authorization is terminated  or revoked sooner.       Influenza A by PCR NEGATIVE NEGATIVE Final   Influenza  B by PCR NEGATIVE NEGATIVE Final    Comment: (NOTE) The Xpert Xpress SARS-CoV-2/FLU/RSV plus assay is intended as an aid in the diagnosis of influenza  from Nasopharyngeal swab specimens and should not be used as a sole basis for treatment. Nasal washings and aspirates are unacceptable for Xpert Xpress SARS-CoV-2/FLU/RSV testing.  Fact Sheet for Patients: EntrepreneurPulse.com.au  Fact Sheet for Healthcare Providers: IncredibleEmployment.be  This test is not yet approved or cleared by the [place] FDA and has been authorized for detection and/or diagnosis of SARS-CoV-2 by FDA under an Emergency Use Authorization (EUA). This EUA will remain in effect (meaning this test can be used) for the duration of the COVID-19 declaration under Section 564(b)(1) of the Act, 21 U.S.C. section 360bbb-3(b)(1), unless the authorization is terminated or revoked.  Performed at [facility], [address] [place], [place] [address]       Radiology Studies: No results found.   Scheduled Meds:  (feeding supplement) PROSource Plus  30 mL Oral TID BM   sodium chloride   Intravenous Once   apixaban  5 mg Oral BID   aspirin EC  81 mg Oral Daily   atorvastatin  40 mg Oral QPC supper   Chlorhexidine Gluconate Cloth  6 each Topical Daily   feeding supplement  1 Container Oral TID BM   ferrous gluconate  324 mg Oral QPC supper   guaiFENesin  1,200 mg Oral BID   isosorbide mononitrate  15 mg Oral Daily   midodrine  10 mg Oral TID WC   mometasone-formoterol  2 puff Inhalation BID   multivitamin with minerals  1 tablet Oral Daily   pantoprazole (PROTONIX) IV  40 mg Intravenous Q12H   phenazopyridine  100 mg Oral Q8H   sodium chloride flush  10-40 mL Intracatheter Q12H   sodium chloride flush  3 mL Intravenous Q12H   tamsulosin  0.4 mg Oral Daily   Continuous Infusions:  sodium chloride     sodium chloride     [START ON 11/21/2020] cefTRIAXone  (ROCEPHIN)  IV     promethazine (PHENERGAN) injection (IM or IVPB)       LOS: 9 days     Enzo Bi, MD [facility] If 7PM-7AM, please contact night-coverage 11/20/2020, 6:11 PM

## 2020-11-20 NOTE — TOC Progression Note (Signed)
Transition of Care [facility]) - Progression Note    Patient Details  Name: [name] Browning MRN: [id number] Date of Birth: 12-18-1940  Transition of Care [facility]) CM/SW [place], [place] Phone Number: [phone] 11/20/2020, 1:33 PM  Clinical Narrative:     CSW contacted Cindy with [facility] confirming they will accept patient with PT/OT/RN and social work home health services.  Jenny Reichmann confirmed they will accept the patient.  CSW contacted Attending and requested home health orders for PT/OT/RN/ SW, patient and family have refused SNF placement.  Expected Discharge Plan: Home/Self Care Barriers to Discharge: Continued Medical Work up  Expected Discharge Plan and Services Expected Discharge Plan: Home/Self Care In-house Referral: Clinical Social Work     Living arrangements for the past 2 months: Mobile Home                                       Social Determinants of Health (SDOH) Interventions    Readmission Risk Interventions No flowsheet data found.

## 2020-11-20 NOTE — Plan of Care (Signed)
Neuro: alert and oriented, tolerates moving-very weak, PT should make priority Resp: tolerating 4L [place], increased from baseline due to lower O2 saturations CV: afebrile, vital signs fairly stable, remains in Afib-rate controlled GIGU: external catheter applied-tolerating well, BM x 1, tolerating PO well, no nausea Skin:moisture associated rash-groin/scrotum otherwise intact Social: Daughter and granddaughter visited this AM, very supportive, looking forward to bringing him home as soon as possible, all questions and concerns addressed  Events: Worked with PT this afternoon, tolerated sitting in the chair well, no bradycardic/chest pain event occurred.   Problem: Education: Goal: Knowledge of General Education information will improve Description: Including pain rating scale, medication(s)/side effects and non-pharmacologic comfort measures Outcome: Progressing   Problem: Health Behavior/Discharge Planning: Goal: Ability to manage health-related needs will improve Outcome: Progressing   Problem: Clinical Measurements: Goal: Ability to maintain clinical measurements within normal limits will improve Outcome: Progressing Goal: Will remain free from infection Outcome: Progressing Goal: Diagnostic test results will improve Outcome: Progressing Goal: Respiratory complications will improve Outcome: Progressing Goal: Cardiovascular complication will be avoided Outcome: Progressing   Problem: Activity: Goal: Risk for activity intolerance will decrease Outcome: Progressing   Problem: Nutrition: Goal: Adequate nutrition will be maintained Outcome: Progressing   Problem: Coping: Goal: Level of anxiety will decrease Outcome: Progressing   Problem: Elimination: Goal: Will not experience complications related to bowel motility Outcome: Progressing Goal: Will not experience complications related to urinary retention Outcome: Progressing   Problem: Pain Managment: Goal: General  experience of comfort will improve Outcome: Progressing   Problem: Safety: Goal: Ability to remain free from injury will improve Outcome: Progressing   Problem: Skin Integrity: Goal: Risk for impaired skin integrity will decrease Outcome: Progressing

## 2020-11-20 NOTE — Progress Notes (Signed)
PHARMACY NOTE:  ANTIMICROBIAL RENAL DOSAGE ADJUSTMENT  Current antimicrobial regimen includes a mismatch between antimicrobial dosage and estimated renal function.  As per policy approved by the Pharmacy & Therapeutics and Medical Executive Committees, the antimicrobial dosage will be adjusted accordingly.  Current antimicrobial dosage:  ceftriaxone 1 g q12h  Indication: UTI  Renal Function:  Estimated Creatinine Clearance: 44.1 mL/min (A) (by C-G formula based on SCr of 1.58 mg/dL (H)).     Antimicrobial dosage has been changed to:  ceftriaxone 1 g    Thank you for allowing pharmacy to be a part of this patient's care.  Tawnya Crook, PharmD, BCPS Clinical Pharmacist 11/20/2020 1:52 PM

## 2020-11-20 NOTE — Progress Notes (Signed)
Patient up to bedside commode for BM. While on [facility], patient reported chest pain. Alver Fisher, RN and [place], NT assisted patient back to bed. Patient HR bradycardic in the 20's. Patient had this same type of episode on the day shift prior. Provider notified and came to bedside. EKG done and troponin sent to lab. 500 mL fluid bolus ordered and given to patient. Patient's symptoms resolved and stabilized. Patient resting comfortably in bed. Will continue to monitor.

## 2020-11-20 NOTE — Progress Notes (Signed)
Cross /cover Overnight event.    Patient complained of burning with urination and rdness periurethral area. UA shows many bacteria and white cells. No nitrates. Likely infection, but given te redness and red cells could also be reaction to material in foley. Started on antibiotics, and pyridium (noted white count also elevated this morning and blood cultures ordered) Patient with severe bradycardia, non sustained this am after bowel movement and burp.  This vagal stimulation and bradycardia is similar to events precipitating last STEMI even.  EKG currently similar to prior. A fib, No STEMI. Serial troponins ordered Also given 500 cc NS given recent labs 5.4, creatinine improving but still higher than baseline

## 2020-11-21 DIAGNOSIS — I5033 Acute on chronic diastolic (congestive) heart failure: Secondary | ICD-10-CM | POA: Diagnosis not present

## 2020-11-21 LAB — CBC
HCT: 30.1 % — ABNORMAL LOW (ref 39.0–52.0)
Hemoglobin: 9.4 g/dL — ABNORMAL LOW (ref 13.0–17.0)
MCH: 29.3 pg (ref 26.0–34.0)
MCHC: 31.2 g/dL (ref 30.0–36.0)
MCV: 93.8 fL (ref 80.0–100.0)
Platelets: 120 10*3/uL — ABNORMAL LOW (ref 150–400)
RBC: 3.21 MIL/uL — ABNORMAL LOW (ref 4.22–5.81)
RDW: 16.2 % — ABNORMAL HIGH (ref 11.5–15.5)
WBC: 11.8 10*3/uL — ABNORMAL HIGH (ref 4.0–10.5)
nRBC: 0 % (ref 0.0–0.2)

## 2020-11-21 LAB — BASIC METABOLIC PANEL
Anion gap: 8 (ref 5–15)
BUN: 41 mg/dL — ABNORMAL HIGH (ref 8–23)
CO2: 27 mmol/L (ref 22–32)
Calcium: 8.6 mg/dL — ABNORMAL LOW (ref 8.9–10.3)
Chloride: 97 mmol/L — ABNORMAL LOW (ref 98–111)
Creatinine, Ser: 1.48 mg/dL — ABNORMAL HIGH (ref 0.61–1.24)
GFR, Estimated: 48 mL/min — ABNORMAL LOW (ref >60)
Glucose, Bld: 185 mg/dL — ABNORMAL HIGH (ref 70–99)
Potassium: 5.1 mmol/L (ref 3.5–5.1)
Sodium: 132 mmol/L — ABNORMAL LOW (ref 135–145)

## 2020-11-21 LAB — MAGNESIUM: Magnesium: 2.3 mg/dL (ref 1.7–2.4)

## 2020-11-21 NOTE — Progress Notes (Signed)
[facility] Cardiology [facility] Encounter Note  Patient: [name] Browning / Admit Date: 11/11/2020 / Date of Encounter: 11/21/2020, 6:51 AM   Subjective: 11/11.  Improved since patient but still having multiple issues.  The patient has had known coronary artery disease with angina exacerbated by recent infection and some chronic hypoxia requiring oxygenation.  The patient has had episodes every morning of some anginal symptoms will and some bradycardia typically surrounding going to the bathroom.  It appears that he may be getting vagal as well with some bradycardia into the 20 bpm range but no evidence of syncope just dizziness.  11/12.  Patient has slept overnight with no evidence of chest discomfort and feelingWell this morning with no evidence of other significant cardiovascular symptoms.  Patient appears to have had reasonable blood pressure control with addition of isosorbide for chest comfort.  There has been no evidence of telemetry changes consistent with sick sinus syndrome at this time  Cardiac catheterization showing minimal atherosclerosis of left anterior descending artery and circumflex with occluded right coronary artery with collateralization which appears to be old and not amenable to PCI and/or stent placement.  Therefore medication management is appropriate  Patient has been relatively hypotensive and therefore cannot use antianginal medications to the fullest amount but will continue to work in the appropriate direction  Review of Systems: Positive for: Shortness of breath chest pain Negative for: Vision change, hearing change, syncope, dizziness, nausea, vomiting,diarrhea, bloody stool, stomach pain, cough, congestion, diaphoresis, urinary frequency, urinary pain,skin lesions, skin rashes Others previously listed  Objective: Telemetry: Atrial fibrillation with controlled ventricular rate Physical Exam: Blood pressure 116/62, pulse 72, temperature 98.1 F (36.7 C),  temperature source Oral, resp. rate 20, height 6\' 2"  (1.88 m), weight 94.2 kg, SpO2 95 %. Body mass index is 26.66 kg/m. General: Well developed, well nourished, in no acute distress. Head: Normocephalic, atraumatic, sclera non-icteric, no xanthomas, nares are without discharge. Neck: No apparent masses Lungs: Normal respirations with no wheezes, no rhonchi, no rales , no crackles   Heart: irregular rate and rhythm, normal S1 S2, no murmur, no rub, no gallop, PMI is normal size and placement, carotid upstroke normal without bruit, jugular venous pressure normal Abdomen: Soft, non-tender, non-distended with normoactive bowel sounds. No hepatosplenomegaly. Abdominal aorta is normal size without bruit Extremities: Trace edema, no clubbing, no cyanosis, no ulcers,  Peripheral: 2+ radial, 2+ femoral, 2+ dorsal pedal pulses Neuro: Alert and oriented. Moves all extremities spontaneously. Psych:  Responds to questions appropriately with a normal affect.   Intake/Output Summary (Last 24 hours) at 11/21/2020 0651 Last data filed at 11/21/2020 0600 Gross per 24 hour  Intake 853 ml  Output 625 ml  Net 228 ml     Inpatient Medications:  . (feeding supplement) PROSource Plus  30 mL Oral TID BM  . sodium chloride   Intravenous Once  . apixaban  5 mg Oral BID  . aspirin EC  81 mg Oral Daily  . atorvastatin  40 mg Oral QPC supper  . Chlorhexidine Gluconate Cloth  6 each Topical Daily  . feeding supplement  1 Container Oral TID BM  . ferrous gluconate  324 mg Oral QPC supper  . guaiFENesin  1,200 mg Oral BID  . isosorbide mononitrate  15 mg Oral Daily  . midodrine  10 mg Oral TID WC  . mometasone-formoterol  2 puff Inhalation BID  . multivitamin with minerals  1 tablet Oral Daily  . pantoprazole  40 mg Oral BID  .  sodium chloride flush  10-40 mL Intracatheter Q12H  . sodium chloride flush  3 mL Intravenous Q12H  . tamsulosin  0.4 mg Oral Daily   Infusions:  . sodium chloride    . sodium  chloride    . cefTRIAXone (ROCEPHIN)  IV    . promethazine (PHENERGAN) injection (IM or IVPB)      Labs: Recent Labs    11/20/20 0432 11/21/20 0423  NA 131* 132*  K 5.4* 5.1  CL 95* 97*  CO2 29 27  GLUCOSE 133* 185*  BUN 51* 41*  CREATININE 1.58* 1.48*  CALCIUM 8.5* 8.6*  MG 2.2 2.3    No results for input(s): AST, ALT, ALKPHOS, BILITOT, PROT, ALBUMIN in the last 72 hours. Recent Labs    11/20/20 0432 11/21/20 0423  WBC 12.1* 11.8*  HGB 9.5* 9.4*  HCT 30.4* 30.1*  MCV 91.8 93.8  PLT 114* 120*    No results for input(s): CKTOTAL, CKMB, TROPONINI in the last 72 hours. Invalid input(s): POCBNP No results for input(s): HGBA1C in the last 72 hours.   Weights: Filed Weights   11/19/20 0500 11/20/20 0437 11/21/20 0208  Weight: 94.5 kg 94.4 kg 94.2 kg     Radiology/Studies:  DG Abd 1 View  Result Date: 11/18/2020 CLINICAL DATA:  Nausea and vomiting. EXAM: ABDOMEN - 1 VIEW COMPARISON:  10/20/2020. FINDINGS: Air is seen in the stomach. Gas in nondistended small bowel and colon. Gas in the rectum. Possible left renal stone. Coarsened basilar pulmonary markings. IMPRESSION: 1. No acute findings. 2. Possible left renal stone. 3. Coarsened basilar pulmonary markings. Electronically Signed   By: Lorin Picket M.D.   On: 11/18/2020 10:35   CT HEAD WO CONTRAST (5MM)  Result Date: 11/05/2020 CLINICAL DATA:  Transient ischemic attack (TIA) EXAM: CT HEAD WITHOUT CONTRAST TECHNIQUE: Contiguous axial images were obtained from the base of the skull through the vertex without intravenous contrast. COMPARISON:  None. FINDINGS: Brain: There is no acute intracranial hemorrhage, mass effect, or edema. Gray-white differentiation is preserved. There is no extra-axial fluid collection. Ventricles and sulci are within normal limits in size and configuration. Vascular: There is atherosclerotic calcification at the skull base. Skull: Calvarium is unremarkable. Sinuses/Orbits: No acute finding.  Other: None. IMPRESSION: No acute intracranial abnormality. Electronically Signed   By: Macy Mis M.D.   On: 11/05/2020 14:20   CT ABDOMEN PELVIS W CONTRAST  Result Date: 11/03/2020 CLINICAL DATA:  Nausea and vomiting EXAM: CT ABDOMEN AND PELVIS WITH CONTRAST TECHNIQUE: Multidetector CT imaging of the abdomen and pelvis was performed using the standard protocol following bolus administration of intravenous contrast. CONTRAST:  132mL OMNIPAQUE IOHEXOL 300 MG/ML  SOLN COMPARISON:  CT abdomen and pelvis dated October 20, 2020 FINDINGS: Lower chest: Cardiomegaly. Emphysema. Solid pulmonary nodules of the left lower lobe. Largest measures up to 7 mm and is located on series 2, image 98. Hepatobiliary: No focal liver abnormality is seen. No gallstones, gallbladder wall thickening, or biliary dilatation. Pancreas: Unremarkable. No pancreatic ductal dilatation or surrounding inflammatory changes. Spleen: Normal in size without focal abnormality. Adrenals/Urinary Tract: Bilateral adrenal glands are unremarkable. No hydronephrosis. Bilateral nonobstructing stones, left greater than right. Bladder is unremarkable. Stomach/Bowel: Stomach is within normal limits. Appendix appears normal. Diverticulosis. No evidence of bowel wall thickening, distention, or inflammatory changes. Vascular/Lymphatic: Aortic atherosclerosis. No enlarged abdominal or pelvic lymph nodes. Reproductive: Prostatomegaly, measuring up to 5.8 cm. Other: Small right greater than left fat containing inguinal hernias. Musculoskeletal: Unchanged L4 compression deformity. No aggressive  appearing osseous lesions. IMPRESSION: Diverticulosis with no evidence of diverticulitis. No acute findings abdomen or pelvis. Solid pulmonary nodules of the left lower lobe, largest measures up to 7 mm. Non-contrast chest CT at 6-12 months is recommended. If the nodule is stable at time of repeat CT, then future CT at 18-24 months (from today's scan) is considered  optional for low-risk patients, but is recommended for high-risk patients. This recommendation follows the consensus statement: Guidelines for Management of Incidental Pulmonary Nodules Detected on CT Images: From the Fleischner Society 2017; Radiology 2017; 284:228-243. Aortic Atherosclerosis (ICD10-I70.0) and Emphysema (ICD10-J43.9). Electronically Signed   By: Yetta Glassman M.D.   On: 11/03/2020 15:48   CARDIAC CATHETERIZATION  Result Date: 11/14/2020 .  Ost RCA lesion is 95% stenosed. .  Prox RCA to Dist RCA lesion is 100% stenosed. -Very minimal flow.  After initial angiography, it disappeared to progressed to the ostium. .  Left-to-right collaterals via septal perforators fill the PDA system. .  Mid LM to Dist LM lesion is 20% stenosed. Colon Flattery LAD to Prox LAD lesion is 25% stenosed.  Relatively small caliber vessel that wraps the apex. .  Diminutive LCx with moderate sized OM1, mild disease. .  The left ventricular systolic function is normal. No obvious regional wall motion normalities. .  The left ventricular ejection fraction is 55-65% by visual estimate. .  LV end diastolic pressure is moderately elevated.  18 mmHg .  There is no aortic valve stenosis. SUMMARY Cardiogenic shock-SCAI scale A-B => not favorable for mechanical support due to ongoing anemia, lack of active anginal symptoms and preserved EF with minimally elevated LVEDP. Severe single-vessel CAD with 95% ostial RCA and 1% flush occlusion of the proximal RCA- Unable to engage with guide catheter to wire the vessel. Does not correlate with anterior elevations and no wall motion normalities Minimal diffuse LAD disease with left-to-right collaterals filling the PDA and part of the PL system. Preserved LVEF of 50 to 55% with no obvious wall motion abnormalities. Successful Right IJ Triple-Lumen Catheter placed RECOMMENDATIONS Would not treat with antiplatelet agent given total occluded vessel and need for DOAC for A. fib.  Hold off on  anticoagulation until hemodynamically stable Continue to titrate Levophed and dopamine for blood pressure support Transfuse to keep hemoglobin greater than 10 Consider possibility of RV involvement as potential cause of shock. Glenetta Hew, MD  US Venous Img Lower Bilateral (DVT)  Result Date: 11/11/2020 CLINICAL DATA:  Shortness of breath EXAM: Bilateral LOWER EXTREMITY VENOUS DOPPLER ULTRASOUND TECHNIQUE: Gray-scale sonography with compression, as well as color and duplex ultrasound, were performed to evaluate the deep venous system(s) from the level of the common femoral vein through the popliteal and proximal calf veins. COMPARISON:  None. FINDINGS: VENOUS Normal compressibility of the common femoral, superficial femoral, and popliteal veins, as well as the visualized calf veins. Visualized portions of profunda femoral vein and great saphenous vein unremarkable. No filling defects to suggest DVT on grayscale or color Doppler imaging. Doppler waveforms show normal direction of venous flow, normal respiratory plasticity and response to augmentation. Limited views of the contralateral common femoral vein are unremarkable. OTHER None. Limitations: none IMPRESSION: Negative. Electronically Signed   By: Merilyn Baba M.D.   On: 11/11/2020 18:07   DG Chest Port 1 View  Result Date: 11/15/2020 CLINICAL DATA:  Acute respiratory failure with hypoxia EXAM: PORTABLE CHEST 1 VIEW COMPARISON:  Chest radiograph from one day prior. FINDINGS: Right internal jugular central venous catheter terminates at  the cavoatrial junction. Pacer pads overlie the left chest. Stable cardiomediastinal silhouette with mild cardiomegaly. No pneumothorax. No pleural effusion. Mild pulmonary edema, similar. Mild to moderate bibasilar atelectasis, worsened. IMPRESSION: 1. Stable mild congestive heart failure. 2. Mild to moderate bibasilar atelectasis, worsened. Electronically Signed   By: Ilona Sorrel M.D.   On: 11/15/2020 07:08   DG  Chest Port 1 View  Result Date: 11/14/2020 CLINICAL DATA:  Central line placement. Hypoxia. Congestive heart failure. EXAM: PORTABLE CHEST 1 VIEW COMPARISON:  Prior today FINDINGS: A new right jugular central venous catheter is seen with tip overlying the proximal right atrium. No evidence of pneumothorax. Mild cardiomegaly stable. Diffuse interstitial infiltrates remains stable. Loculated fluid within the left major fissure is stable, and mild atelectasis or scarring is again seen in the right upper lobe. IMPRESSION: New right jugular central venous catheter tip overlies the proximal right atrium. No evidence of pneumothorax. Stable cardiomegaly and diffuse interstitial infiltrates/edema. Stable small loculated left pleural effusion, and right upper lobe atelectasis versus scarring. Electronically Signed   By: Marlaine Hind M.D.   On: 11/14/2020 14:11   DG Chest Port 1 View  Result Date: 11/14/2020 CLINICAL DATA:  Hypoxia EXAM: PORTABLE CHEST 1 VIEW COMPARISON:  11/11/2020 chest radiograph. FINDINGS: Stable cardiomediastinal silhouette with mild cardiomegaly. No pneumothorax. Focal opacity overlying the peripheral mid to upper left lung appears more discrete. No right pleural effusion. Similar mild pulmonary edema. Stable small focal lung opacity in the peripheral upper right lung. IMPRESSION: 1. Similar mild congestive heart failure. 2. Focal opacity overlying the peripheral mid to upper left lung appears more discrete. Stable smaller focal peripheral upper right lung opacity. These may represent loculated pleural effusions and/or pulmonary nodules. Suggest further evaluation with noncontrast chest CT. Electronically Signed   By: Ilona Sorrel M.D.   On: 11/14/2020 07:26   DG Chest Portable 1 View  Result Date: 11/11/2020 CLINICAL DATA:  Shortness of breath EXAM: PORTABLE CHEST 1 VIEW COMPARISON:  Previous studies including the examination of 10/29/2020 FINDINGS: Transverse diameter of heart is slightly  increased. Thoracic aorta is tortuous and ectatic. There is prominence of interstitial markings in the parahilar regions with interval worsening. There is faint haziness in the lateral aspect of left mid and both lower lung fields. There is a small smooth marginated radiopacity, possibly in the lateral aspect of minor fissure in the right mid lung fields. There is pleural thickening in both apices, more so on the left side. There is no pneumothorax. Lateral CP angles are clear. IMPRESSION: There is increase in interstitial markings in the parahilar regions suggesting interstitial edema or interstitial pneumonitis. Part of this finding is probably underlying scarring. There is diffuse haziness in the lateral aspect of left mid and both lower lung fields which may be due to pleural thickening or loculated pleural effusions or pneumonia. Follow-up chest radiographs and CT as clinically warranted should be considered. Electronically Signed   By: Elmer Picker M.D.   On: 11/11/2020 13:38   DG Chest Port 1 View  Result Date: 10/29/2020 CLINICAL DATA:  Shortness of breath EXAM: PORTABLE CHEST 1 VIEW COMPARISON:  10/27/2020, 10/23/2020, 08/27/2017,, CT 09/19/2018 FINDINGS: Emphysematous disease. Mild reticular opacity at the bases likely due to fibrosis. Slight decreased interstitial opacity since 10/27/2020. No new confluent airspace disease, pleural effusion or pneumothorax. Apical pleural thickening. Stable cardiomediastinal silhouette with aortic atherosclerosis. IMPRESSION: 1. Underlying chronic lung disease/emphysema with overall decreased interstitial opacity since 10/27/2020 suggesting resolving superimposed edema or inflammatory process 2. No  new confluent airspace disease Electronically Signed   By: Donavan Foil M.D.   On: 10/29/2020 15:39   DG Chest Port 1 View  Result Date: 10/27/2020 CLINICAL DATA:  Chronic hypoxemia, COPD presents with weakness and shortness of breath EXAM: PORTABLE CHEST 1  VIEW COMPARISON:  Chest radiograph 10/23/2020 FINDINGS: The cardiomediastinal silhouette is stable. There are diffusely coarsened interstitial markings throughout both lungs, overall not significantly changed since 10/23/2020. There is no new or worsening focal airspace disease. There is no pleural effusion. There is no pneumothorax. There is no acute osseous abnormality. IMPRESSION: Unchanged coarsened interstitial markings throughout both lungs since 10/23/2020 though these are increased since 10/06/2020. Findings may reflect edema or infection superimposed on chronic changes. Electronically Signed   By: Valetta Mole M.D.   On: 10/27/2020 09:05   DG Chest Port 1 View  Result Date: 10/23/2020 CLINICAL DATA:  Respiratory failure, increasing shortness of breath EXAM: PORTABLE CHEST 1 VIEW COMPARISON:  10/20/2020 FINDINGS: Unchanged mildly enlarged cardiac contour. Redemonstrated diffuse airspace and interstitial opacities bilaterally, which appear largely unchanged compared to the prior exam. No definite pleural effusion. Aortic calcifications. No acute osseous abnormality. IMPRESSION: Unchanged bilateral pulmonary opacities, which could represent edema or infection. Electronically Signed   By: Merilyn Baba M.D.   On: 10/23/2020 17:49   ECHOCARDIOGRAM COMPLETE  Result Date: 11/15/2020    ECHOCARDIOGRAM REPORT   Patient Name:   LEJEND DALBY Date of Exam: 11/15/2020 Medical Rec #:  [id number]     Height:       74.0 in Accession #:    [id number]    Weight:       190.5 lb Date of Birth:  Jul 11, 1940     BSA:          2.129 m Patient Age:    80 years      BP:           105/60 mmHg Patient Gender: M             HR:           81 bpm. Exam Location:  [facility] Procedure: 2D Echo Indications:     Myocardial Infarct I21.9  History:         Patient has prior history of Echocardiogram examinations, most                  recent 10/23/2020.  Sonographer:     Kathlen Brunswick RDCS Referring Phys:  [place] Diagnosing  Phys: [place]  1. Left ventricular ejection fraction, by estimation, is 60 to 65%. The left ventricle has normal function. The left ventricle demonstrates regional wall motion abnormalities (see scoring diagram/findings for description). Left ventricular diastolic parameters are consistent with Grade I diastolic dysfunction (impaired relaxation).  2. Right ventricular systolic function is severely reduced. The right ventricular size is severely enlarged.  3. Left atrial size was severely dilated.  4. Right atrial size was severely dilated.  5. The mitral valve is normal in structure. Mild mitral valve regurgitation. No evidence of mitral stenosis. Moderate mitral annular calcification.  6. The aortic valve is normal in structure. Aortic valve regurgitation is not visualized. Mild aortic valve sclerosis is present, with no evidence of aortic valve stenosis.  7. The inferior vena cava is normal in size with greater than 50% respiratory variability, suggesting right atrial pressure of 3 mmHg. FINDINGS  Left Ventricle: Left ventricular ejection fraction, by estimation, is 60 to 65%. The left ventricle has  normal function. The left ventricle demonstrates regional wall motion abnormalities. The left ventricular internal cavity size was normal in size. There is no left ventricular hypertrophy. Left ventricular diastolic parameters are consistent with Grade I diastolic dysfunction (impaired relaxation). Right Ventricle: The right ventricular size is severely enlarged. No increase in right ventricular wall thickness. Right ventricular systolic function is severely reduced. Left Atrium: Left atrial size was severely dilated. Right Atrium: Right atrial size was severely dilated. Pericardium: There is no evidence of pericardial effusion. Mitral Valve: The mitral valve is normal in structure. Moderate mitral annular calcification. Mild mitral valve regurgitation. No evidence of mitral valve stenosis. Tricuspid  Valve: The tricuspid valve is normal in structure. Tricuspid valve regurgitation is mild . No evidence of tricuspid stenosis. Aortic Valve: The aortic valve is normal in structure. Aortic valve regurgitation is not visualized. Mild aortic valve sclerosis is present, with no evidence of aortic valve stenosis. Aortic valve peak gradient measures 5.5 mmHg. Pulmonic Valve: The pulmonic valve was normal in structure. Pulmonic valve regurgitation is not visualized. No evidence of pulmonic stenosis. Aorta: The aortic root is normal in size and structure. Venous: The inferior vena cava is normal in size with greater than 50% respiratory variability, suggesting right atrial pressure of 3 mmHg. IAS/Shunts: No atrial level shunt detected by color flow Doppler.  LEFT VENTRICLE PLAX 2D LVIDd:         3.74 cm   Diastology LVIDs:         2.69 cm   LV e' medial:    5.77 cm/s LV PW:         1.26 cm   LV E/e' medial:  20.8 LV IVS:        1.18 cm   LV e' lateral:   7.83 cm/s LVOT diam:     1.80 cm   LV E/e' lateral: 15.3 LV SV:         29 LV SV Index:   14 LVOT Area:     2.54 cm  RIGHT VENTRICLE RV Basal diam:  4.32 cm RV S prime:     4.79 cm/s TAPSE (M-mode): 1.1 cm LEFT ATRIUM             Index        RIGHT ATRIUM           Index LA diam:        3.90 cm 1.83 cm/m   RA Area:     18.80 cm LA Vol (A2C):   42.4 ml 19.92 ml/m  RA Volume:   56.50 ml  26.54 ml/m LA Vol (A4C):   36.0 ml 16.91 ml/m LA Biplane Vol: 40.7 ml 19.12 ml/m  AORTIC VALVE                 PULMONIC VALVE AV Area (Vmax): 1.65 cm     PV Vmax:       0.94 m/s AV Vmax:        117.00 cm/s  PV Peak grad:  3.5 mmHg AV Peak Grad:   5.5 mmHg LVOT Vmax:      75.80 cm/s LVOT Vmean:     44.200 cm/s LVOT VTI:       0.113 m  AORTA Ao Root diam: 3.20 cm Ao Asc diam:  2.70 cm MITRAL VALVE                TRICUSPID VALVE MV Area (PHT): 3.76 cm     TV Peak grad:   17.4 mmHg  MV Decel Time: 202 msec     TV Vmax:        2.08 m/s MV E velocity: 120.00 cm/s                              SHUNTS                             Systemic VTI:  0.11 m                             Systemic Diam: 1.80 cm Neoma Laming Electronically signed by Neoma Laming Signature Date/Time: 11/15/2020/10:38:00 AM    Final    ECHOCARDIOGRAM COMPLETE  Result Date: 10/23/2020    ECHOCARDIOGRAM REPORT   Patient Name:   VERLIE HELLENBRAND Date of Exam: 10/23/2020 Medical Rec #:  [id number]     Height:       74.0 in Accession #:    [id number]    Weight:       180.0 lb Date of Birth:  18-Sep-1940     BSA:          2.078 m Patient Age:    6 years      BP:           133/80 mmHg Patient Gender: M             HR:           95 bpm. Exam Location:  [facility] Procedure: 2D Echo, Cardiac Doppler and Color Doppler Indications:     CHF-acute diastolic J62.83  History:         Patient has prior history of Echocardiogram examinations, most                  recent 04/20/2015. Angina, COPD and TIA; Signs/Symptoms:Syncope.  Sonographer:     Sherrie Sport Referring Phys:  [id number] [place] Diagnosing Phys: Ida Rogue MD  Sonographer Comments: Suboptimal apical window. IMPRESSIONS  1. Left ventricular ejection fraction, by estimation, is 60 to 65%. The left ventricle has normal function. The left ventricle has no regional wall motion abnormalities. Left ventricular diastolic parameters are indeterminate.  2. Right ventricular systolic function is normal. The right ventricular size is normal. There is mildly elevated pulmonary artery systolic pressure.  3. Rhythm is atrial fibrillation  4. Left atrial size was moderately dilated. FINDINGS  Left Ventricle: Left ventricular ejection fraction, by estimation, is 60 to 65%. The left ventricle has normal function. The left ventricle has no regional wall motion abnormalities. The left ventricular internal cavity size was normal in size. There is  no left ventricular hypertrophy. Left ventricular diastolic parameters are indeterminate. Right Ventricle: The right ventricular size is normal. No increase in right  ventricular wall thickness. Right ventricular systolic function is normal. There is mildly elevated pulmonary artery systolic pressure. The tricuspid regurgitant velocity is 2.95  m/s, and with an assumed right atrial pressure of 5 mmHg, the estimated right ventricular systolic pressure is 07.3 mmHg. Left Atrium: Left atrial size was moderately dilated. Right Atrium: Right atrial size was normal in size. Pericardium: There is no evidence of pericardial effusion. Mitral Valve: The mitral valve is normal in structure. There is mild thickening of the mitral valve leaflet(s). No evidence of mitral valve regurgitation. No evidence of mitral valve stenosis. Tricuspid Valve: The tricuspid valve is normal  in structure. Tricuspid valve regurgitation is mild . No evidence of tricuspid stenosis. Aortic Valve: The aortic valve is normal in structure. Aortic valve regurgitation is not visualized. No aortic stenosis is present. Aortic valve mean gradient measures 2.0 mmHg. Aortic valve peak gradient measures 4.7 mmHg. Aortic valve area, by VTI measures 3.04 cm. Pulmonic Valve: The pulmonic valve was normal in structure. Pulmonic valve regurgitation is not visualized. No evidence of pulmonic stenosis. Aorta: The aortic root is normal in size and structure. Venous: The inferior vena cava is normal in size with greater than 50% respiratory variability, suggesting right atrial pressure of 3 mmHg. IAS/Shunts: No atrial level shunt detected by color flow Doppler.  LEFT VENTRICLE PLAX 2D LVIDd:         4.19 cm LVIDs:         2.93 cm LV PW:         1.33 cm LV IVS:        0.95 cm LVOT diam:     2.00 cm LV SV:         55 LV SV Index:   26 LVOT Area:     3.14 cm  RIGHT VENTRICLE RV Basal diam:  4.55 cm RV S prime:     7.51 cm/s TAPSE (M-mode): 3.8 cm LEFT ATRIUM              Index        RIGHT ATRIUM           Index LA diam:        3.50 cm  1.68 cm/m   RA Area:     26.60 cm LA Vol (A2C):   93.4 ml  44.94 ml/m  RA Volume:   96.30 ml   46.34 ml/m LA Vol (A4C):   128.0 ml 61.59 ml/m LA Biplane Vol: 113.0 ml 54.37 ml/m  AORTIC VALVE                    PULMONIC VALVE AV Area (Vmax):    2.83 cm     PV Vmax:        0.96 m/s AV Area (Vmean):   2.84 cm     PV Peak grad:   3.6 mmHg AV Area (VTI):     3.04 cm     RVOT Peak grad: 4 mmHg AV Vmax:           108.00 cm/s AV Vmean:          70.100 cm/s AV VTI:            0.181 m AV Peak Grad:      4.7 mmHg AV Mean Grad:      2.0 mmHg LVOT Vmax:         97.30 cm/s LVOT Vmean:        63.300 cm/s LVOT VTI:          0.175 m LVOT/AV VTI ratio: 0.97  AORTA Ao Root diam: 3.30 cm MITRAL VALVE                TRICUSPID VALVE MV Area (PHT): 4.52 cm     TR Peak grad:   34.8 mmHg MV Decel Time: 168 msec     TR Vmax:        295.00 cm/s MV E velocity: 134.00 cm/s                             SHUNTS  Systemic VTI:  0.18 m                             Systemic Diam: 2.00 cm Ida Rogue MD Electronically signed by Ida Rogue MD Signature Date/Time: 10/23/2020/5:30:24 PM    Final      Assessment and Recommendation  80 y.o. male with known coronary artery disease with occluded right coronary artery with collateralization and not amenable to PCI and stent placement with some hypotension infection and non-ST elevation myocardial infarction having continued episodes of chest discomfort and bradycardia with chronic nonvalvular atrial fibrillation dating continued medication management 1.  Patient is continuing midodrine for hypotension but will consider the possibility of addition of isosorbide mononitrate 30 mg each day for further treatment of anginal symptoms if they recur to improve collateralization and treatment of possible stable ischemia to the inferior wall of the myocardium.  We will follow closely with ambulation today and potentially other medication management evaluation 2.  Avoid any beta-blocker or heart rate regulating medication management due to concerns of mild sick sinus  syndrome not symptomatic at this time mainly in the setting of vagal maneuvers including urination and defecation 3.  Continuation of anticoagulation for further risk reduction and stroke with atrial fibrillation 4.  Continuation of supportive care for infection and hypoxia as per medicine 5.  Continue aspirin for further risk reduction and myocardial infarction and/or coronary artery disease with single antiplatelet therapy due to concerns of bleeding complications with concomitant use of anticoagulation 6.  High intensity cholesterol therapy 7.  No further cardiac diagnostics necessary at this time while continuing to treat above 8.  When patient improved with overall cardiac condition will consider the possibility of discharge to home with follow-up next week  Signed, Serafina Royals M.D. FACC

## 2020-11-21 NOTE — Progress Notes (Signed)
PROGRESS NOTE    [name] Browning  PPJ:[id number] DOB: 10/03/1940 DOA: 11/11/2020 PCP: [place]  [id number]   Assessment & Plan:   Principal Problem:   Acute on chronic diastolic CHF (congestive heart failure) (HCC) Active Problems:   HTN (hypertension)   COPD, moderate (HCC)   Acute on chronic respiratory failure with hypoxia (HCC)   Atrial fibrillation (HCC)   DVT (deep venous thrombosis) (HCC)   HLD (hyperlipidemia)   Stroke (HCC)   CAD (coronary artery disease)   NSTEMI (non-ST elevated myocardial infarction) (HCC)   Hypokalemia   Thrombocytopenia (HCC)   Iron deficiency anemia   GI bleeding   Acute ST elevation myocardial infarction (STEMI) of anterior wall (HCC)   Cardiogenic shock ([place])   Hemorrhagic shock [facility])   [name] is a 80 year old male with history of CAD (nonobstructive calcified coronary disease), atrial fibrillation on Eliquis, HFpEF, hypertension, TIA in 2016, COPD on 2 L who was admitted to the hospital on 11/2 with shortness of breath.  He is admitted to the hospital 2 times in the last month.   First admission was from 10/20/2020 to 10/31/2020 when he was admitted with respiratory failure due to COPD and pneumonia, as well as an obstructive left renal stone.  He developed hypotension and worsening respiratory distress requiring pressors and BiPAP.     Blood cultures grew positive for Enterococcus faecalis and staph hemolyticus.  He was treated with antibiotics ending 11/03/2020.  The patient was recently admitted to the hospital from 10/25-10/28 with complaints of dysphagia.     He was seen by gastroenterology who underwent an EGD with no significant findings.  Since that time he has had progressive worsening shortness of breath, eventually presented to the emergency department where he was found to have an oxygen saturation of 88% on his home 2 L of oxygen.    Recurrent chest pain, likely anginal  --brief episodes, responsive to SL  nitro --started Imdur 15 mg by cardiology --cont Imdur  Bradycardia, intermittent --mild sick sinus syndrome not symptomatic at this time mainly in the setting of vagal maneuvers including urination and defecation. --avoid beta-blocker or other rate control agents  ACUTE on chronic hypoxic respiratory failure on 2L at baseline --2-4L requirement --Continue supplemental O2 to keep sats between 88-92%   Acute Anterior STEMI  Hx of Ischemic Cardiomyopathy - CODE STEMI CALLED 11/5 with ST elevations V2,V3.  cardiac catheterization showed no significant obstruction in the LAD with occluded RCA and collaterals from LAD to RCA.  It appeared that RCA was chronically occluded. No intervention. --s/p heparin gtt Plan: --cont ASA and statin --cont to hold beta blocker due to hypotension and intermittent bradycardia --cont Imdur 15 mg daily (new)  Acute on chronic diastolic CHF Echo 58/0: LVEF 60-65%, LV regional wall motion abnormalities, Grade 1 diastolic dysfunction, RV systolic function severely reduced with RV severe enlargement, LA + RA severely dilated --s/p IV lasix diuresis   CARDIOGENIC SHOCK, resolved Hypotension, improved --weaned off Levophed and dopamine as of morning of 11/9 --cont midodrine 10 mg TID   Anemia, acute on chronic Iron deficiency versus ? Acute blood loss secondary to GI bleed Presented with dark-colored stools and stool occult positive, noted drop in hgb which normalized s/p pRBC transfusion.  - GI following --s/p 2u pRBC Plan: --no plan for endoscopic procedures at this time, per GI --cont oral PPI   ACUTE KIDNEY INJURY --dehydration, contrast.  Cr improved with gentle IVF hydration --encourage oral hydration  COPD --cont  daily bronchodilators  Dysuria --started on ceftriaxone today by night team.  Urine cx added on later. --cont ceftriaxone pending urine cx results   DVT prophylaxis: OM:VEHMCNO Code Status: Full code  Family Communication:   Level of care: stepdown Dispo:   The patient is from: home Anticipated d/c is to: home with [facility] (pt and family refused SNF rehab)  Anticipated d/c date is: 1-2 days Patient currently is not medically ready to d/c due to: monitor for chest pain, with recent STEMI   Subjective and Interval History:  No more chest pain for the past day.  Pt reported nausea and appetite improved.   Objective: Vitals:   11/21/20 0207 11/21/20 0208 11/21/20 0900 11/21/20 1501  BP: 116/62  122/66 101/60  Pulse: 72   73  Resp: 20  20 18   Temp: 98.1 F (36.7 C)  98.2 F (36.8 C) 97.8 F (36.6 C)  TempSrc: Oral  Oral Oral  SpO2: 95%  96% 96%  Weight:  94.2 kg    Height:  6\' 2"  (1.88 m)      Intake/Output Summary (Last 24 hours) at 11/21/2020 1654 Last data filed at 11/21/2020 1523 Gross per 24 hour  Intake 1294.71 ml  Output 1330 ml  Net -35.29 ml   Filed Weights   11/19/20 0500 11/20/20 0437 11/21/20 0208  Weight: 94.5 kg 94.4 kg 94.2 kg    Examination:   Constitutional: NAD, sleeping, but arousable, oriented HEENT: conjunctivae and lids normal, EOMI CV: No cyanosis.   RESP: normal respiratory effort Extremities: 2+ pitting edema in BLE SKIN: warm, dry Neuro: II - XII grossly intact.     Data Reviewed: I have personally reviewed following labs and imaging studies  CBC: Recent Labs  Lab 11/17/20 2315 11/18/20 0506 11/19/20 0503 11/20/20 0432 11/21/20 0423  WBC 9.4 8.6 8.9 12.1* 11.8*  HGB 9.9* 9.9* 9.9* 9.5* 9.4*  HCT 30.9* 30.6* 30.2* 30.4* 30.1*  MCV 91.7 89.5 91.0 91.8 93.8  PLT 133* 140* 117* 114* 709*   Basic Metabolic Panel: Recent Labs  Lab 11/15/20 0401 11/16/20 0510 11/17/20 0514 11/18/20 0506 11/19/20 0503 11/20/20 0432 11/21/20 0423  NA 138 135 132* 133* 131* 131* 132*  K 4.3 4.0 3.8 4.1 4.3 5.4* 5.1  CL 102 98 96* 96* 95* 95* 97*  CO2 27 28 28 28 28 29 27   GLUCOSE 145* 133* 114* 120* 117* 133* 185*  BUN 45* 44* 44* 56* 50* 51* 41*  CREATININE  2.17* 2.01* 1.67* 2.28* 1.88* 1.58* 1.48*  CALCIUM 7.9* 8.1* 8.2* 8.6* 8.5* 8.5* 8.6*  MG 2.5* 2.3  --   --  2.6* 2.2 2.3  PHOS 5.1* 4.5 4.1  --   --   --   --    GFR: Estimated Creatinine Clearance: 47.1 mL/min (A) (by C-G formula based on SCr of 1.48 mg/dL (H)). Liver Function Tests: Recent Labs  Lab 11/15/20 0401 11/16/20 0510 11/17/20 0514  ALBUMIN 3.0* 3.1* 2.8*   No results for input(s): LIPASE, AMYLASE in the last 168 hours. No results for input(s): AMMONIA in the last 168 hours. Coagulation Profile: No results for input(s): INR, PROTIME in the last 168 hours. Cardiac Enzymes: No results for input(s): CKTOTAL, CKMB, CKMBINDEX, TROPONINI in the last 168 hours. BNP (last 3 results) No results for input(s): PROBNP in the last 8760 hours. HbA1C: No results for input(s): HGBA1C in the last 72 hours. CBG: Recent Labs  Lab 11/20/20 0608  GLUCAP 111*   Lipid Profile: No results for  input(s): CHOL, HDL, LDLCALC, TRIG, CHOLHDL, LDLDIRECT in the last 72 hours. Thyroid Function Tests: No results for input(s): TSH, T4TOTAL, FREET4, T3FREE, THYROIDAB in the last 72 hours. Anemia Panel: No results for input(s): VITAMINB12, FOLATE, FERRITIN, TIBC, IRON, RETICCTPCT in the last 72 hours. Sepsis Labs: Recent Labs  Lab 11/14/20 1837 11/15/20 0401  LATICACIDVEN 2.9* 1.9    Recent Results (from the past 240 hour(s))  CULTURE, BLOOD (ROUTINE X 2) w Reflex to ID Panel     Status: None (Preliminary result)   Collection Time: 11/20/20  8:44 AM   Specimen: BLOOD  Result Value Ref Range Status   Specimen Description BLOOD BLOOD RIGHT WRIST  Final   Special Requests   Final    BOTTLES DRAWN AEROBIC AND ANAEROBIC Blood Culture adequate volume   Culture   Final    NO GROWTH < 24 HOURS Performed at [facility], [address] [place], [place] [address]    Report Status PENDING  Incomplete  CULTURE, BLOOD (ROUTINE X 2) w Reflex to ID Panel     Status: None (Preliminary  result)   Collection Time: 11/20/20  8:44 AM   Specimen: BLOOD  Result Value Ref Range Status   Specimen Description BLOOD BLOOD LEFT HAND  Final   Special Requests   Final    BOTTLES DRAWN AEROBIC AND ANAEROBIC Blood Culture adequate volume   Culture   Final    NO GROWTH < 24 HOURS Performed at [facility], [address], [place], [place] [address]    Report Status PENDING  Incomplete      Radiology Studies: No results found.   Scheduled Meds:  (feeding supplement) PROSource Plus  30 mL Oral TID BM   sodium chloride   Intravenous Once   apixaban  5 mg Oral BID   aspirin EC  81 mg Oral Daily   atorvastatin  40 mg Oral QPC supper   Chlorhexidine Gluconate Cloth  6 each Topical Daily   feeding supplement  1 Container Oral TID BM   ferrous gluconate  324 mg Oral QPC supper   guaiFENesin  1,200 mg Oral BID   isosorbide mononitrate  15 mg Oral Daily   midodrine  10 mg Oral TID WC   mometasone-formoterol  2 puff Inhalation BID   multivitamin with minerals  1 tablet Oral Daily   pantoprazole  40 mg Oral BID   sodium chloride flush  10-40 mL Intracatheter Q12H   sodium chloride flush  3 mL Intravenous Q12H   tamsulosin  0.4 mg Oral Daily   Continuous Infusions:  sodium chloride     sodium chloride     cefTRIAXone (ROCEPHIN)  IV Stopped (11/21/20 1124)   promethazine (PHENERGAN) injection (IM or IVPB)       LOS: 10 days     Enzo Bi, MD [facility] If 7PM-7AM, please contact night-coverage 11/21/2020, 4:54 PM

## 2020-11-21 NOTE — Progress Notes (Signed)
Physical Therapy Treatment Patient Details Name: [name] Browning MRN: [id number] DOB: Feb 11, 1940 Today's Date: 11/21/2020   History of Present Illness [name] Browning is a 80 y.o. male with a past medical history of anxiety, CAD, COPD on 2 L of oxygen, prior CVA, CHF, hypertension, hyperlipidemia, presents to the emergency department for shortness of breath.  According to the patient he was recently discharged from the hospital 10/28 for shortness of breath due to CHF exacerbation. ICU stay since 11/05 for treatment of hypotension, hypoxemia, STEMI,  possible GI bleed.    PT Comments    Pt was long sitting in bed upon arriving. On 4 L HFNC (green tubing) with sao2 92% and HR around 88 bpm. He agrees to session and is cooperative throughout. He is oriented x 4 but author questions if he understands full extent of current situation. He presents with severe scrotal and BLE edema. Required mod assist to exit bed with increased time and vcs. Stood to [employer] with min assist from slightly elevated bed height. HR quickly elevates with very minimal activity. Requires prolonged rest between any/all activity. Took a few steps to recliner but Pryor Curia elected not to ambulate greater distances due to cardiac response. Acute PT will continue to follow and progress pt as able. Author highly recommends SNF at [place] to improve safety and activity tolerance prior to going home. Pt is planning to DC straight home to granddaughters house.     Recommendations for follow up therapy are one component of a multi-disciplinary discharge planning process, led by the attending physician.  Recommendations may be updated based on patient status, additional functional criteria and insurance authorization.  Follow Up Recommendations  Skilled nursing-short term rehab (<3 hours/day) (pt does not want to go to rehab at [place]. He plans to go to [facility]. PT highly recommends rehab to improve activity tolerance prior to returning home.)      Assistance Recommended at Discharge Frequent or constant Supervision/Assistance  Equipment Recommendations  Other (comment) (defer to next level of care)       Precautions / Restrictions Precautions Precautions: Fall Precaution Comments: VRE Restrictions Weight Bearing Restrictions: No     Mobility  Bed Mobility Overal bed mobility: Needs Assistance Bed Mobility: Supine to Sit     Supine to sit: Min assist;Mod assist;HOB elevated     General bed mobility comments: pt required increased time and min-mod assist of one. increased time to perform with vcs for step by step progresion. Pt is slow moving. Noteable scotal edema and BLEs edema. HR quickly elevated to 120 with minimal activity. sat EOB x several minutes prior to standing. On 4 L HFNC (green tubing)    Transfers Overall transfer level: Needs assistance Equipment used: Rolling walker (2 wheels) Transfers: Sit to/from Stand Sit to Stand: Min assist           General transfer comment: min assist to stand from lowest bed height. vcs for improved technique    Ambulation/Gait Ambulation/Gait assistance: Min guard Gait Distance (Feet): 5 Feet Assistive device: Rolling walker (2 wheels) Gait Pattern/deviations: Step-to pattern;Trunk flexed;Narrow base of support Gait velocity: decreased     General Gait Details: Pt ambulated to recliner from EOB. he is ery deconditioned. HR elevated quickly but did resolve with prolonged seated rest.    Balance Overall balance assessment: Needs assistance Sitting-balance support: Bilateral upper extremity supported;Feet supported Sitting balance-Leahy Scale: Fair Sitting balance - Comments: close SBA with occasional CGA for additional safety   Standing balance support: During functional  activity;Bilateral upper extremity supported;Reliant on assistive device for balance Standing balance-Leahy Scale: Poor Standing balance comment: Heavy reliance on RW for support. Difficulty  maintaining head elevated to scan environment       Cognition Arousal/Alertness: Awake/alert Behavior During Therapy: WFL for tasks assessed/performed Overall Cognitive Status: Within Functional Limits for tasks assessed       General Comments: A&Ox4. Pleasant throughout.               Pertinent Vitals/Pain Pain Assessment: No/denies pain Pain Score: 0-No pain Pain Intervention(s): Limited activity within patient's tolerance;Monitored during session;Premedicated before session;Repositioned     PT Goals (current goals can now be found in the care plan section) Acute Rehab PT Goals Patient Stated Goal: to go home Progress towards PT goals: Progressing toward goals (limited by cardio-respiratory response to minimal activity)    Frequency    Min 2X/week      PT Plan Current plan remains appropriate       AM-PAC PT "6 Clicks" Mobility   Outcome Measure  Help needed turning from your back to your side while in a flat bed without using bedrails?: A Little Help needed moving from lying on your back to sitting on the side of a flat bed without using bedrails?: A Lot Help needed moving to and from a bed to a chair (including a wheelchair)?: A Little Help needed standing up from a chair using your arms (e.g., wheelchair or bedside chair)?: A Little Help needed to walk in hospital room?: A Lot Help needed climbing 3-5 steps with a railing? : Total 6 Click Score: 14    End of Session Equipment Utilized During Treatment: Oxygen (4 L HFNC) Activity Tolerance: Patient limited by fatigue Patient left: in chair;with family/visitor present;with call bell/phone within reach Nurse Communication: Mobility status PT Visit Diagnosis: Unsteadiness on feet (R26.81);Other abnormalities of gait and mobility (R26.89);Muscle weakness (generalized) (M62.81);Difficulty in walking, not elsewhere classified (R26.2)     Time: 1210-1226 PT Time Calculation (min) (ACUTE ONLY): 16  min  Charges:  $Therapeutic Activity: 8-22 mins                    Julaine Fusi PTA 11/21/20, 12:41 PM

## 2020-11-21 NOTE — Progress Notes (Signed)
Mobility Specialist - Progress Note   11/21/20 1246  Mobility  Activity Refused mobility  Mobility performed by Mobility specialist    Pt in session with PT upon arrival, will attempt session another date/time to allow for recovery.    Kathee Delton Mobility Specialist 11/21/20, 12:48 PM

## 2020-11-22 DIAGNOSIS — I5033 Acute on chronic diastolic (congestive) heart failure: Secondary | ICD-10-CM | POA: Diagnosis not present

## 2020-11-22 LAB — MAGNESIUM: Magnesium: 2.1 mg/dL (ref 1.7–2.4)

## 2020-11-22 LAB — CBC
HCT: 29.3 % — ABNORMAL LOW (ref 39.0–52.0)
Hemoglobin: 9.2 g/dL — ABNORMAL LOW (ref 13.0–17.0)
MCH: 28.5 pg (ref 26.0–34.0)
MCHC: 31.4 g/dL (ref 30.0–36.0)
MCV: 90.7 fL (ref 80.0–100.0)
Platelets: 148 10*3/uL — ABNORMAL LOW (ref 150–400)
RBC: 3.23 MIL/uL — ABNORMAL LOW (ref 4.22–5.81)
RDW: 16 % — ABNORMAL HIGH (ref 11.5–15.5)
WBC: 9.3 10*3/uL (ref 4.0–10.5)
nRBC: 0 % (ref 0.0–0.2)

## 2020-11-22 LAB — BASIC METABOLIC PANEL
Anion gap: 7 (ref 5–15)
BUN: 37 mg/dL — ABNORMAL HIGH (ref 8–23)
CO2: 28 mmol/L (ref 22–32)
Calcium: 8.6 mg/dL — ABNORMAL LOW (ref 8.9–10.3)
Chloride: 98 mmol/L (ref 98–111)
Creatinine, Ser: 1.24 mg/dL (ref 0.61–1.24)
GFR, Estimated: 59 mL/min — ABNORMAL LOW (ref >60)
Glucose, Bld: 137 mg/dL — ABNORMAL HIGH (ref 70–99)
Potassium: 4.6 mmol/L (ref 3.5–5.1)
Sodium: 133 mmol/L — ABNORMAL LOW (ref 135–145)

## 2020-11-22 MED ORDER — GUAIFENESIN 100 MG/5ML PO LIQD
30.0000 mL | Freq: Four times a day (QID) | ORAL | Status: DC
  Filled 2020-11-22 (×4): qty 30

## 2020-11-22 MED ORDER — GUAIFENESIN 100 MG/5ML PO LIQD
15.0000 mL | Freq: Four times a day (QID) | ORAL | Status: DC
  Administered 2020-11-22 – 2020-11-27 (×17): 15 mL via ORAL
  Filled 2020-11-22 (×5): qty 20
  Filled 2020-11-22 (×2): qty 15
  Filled 2020-11-22 (×8): qty 20
  Filled 2020-11-22: qty 15
  Filled 2020-11-22 (×5): qty 20
  Filled 2020-11-22: qty 15
  Filled 2020-11-22 (×4): qty 20
  Filled 2020-11-22: qty 15
  Filled 2020-11-22: qty 20
  Filled 2020-11-22: qty 15
  Filled 2020-11-22 (×2): qty 20
  Filled 2020-11-22: qty 15
  Filled 2020-11-22 (×2): qty 20
  Filled 2020-11-22: qty 15
  Filled 2020-11-22 (×13): qty 20
  Filled 2020-11-22: qty 15
  Filled 2020-11-22: qty 20

## 2020-11-22 MED ORDER — FUROSEMIDE 10 MG/ML IJ SOLN
40.0000 mg | Freq: Once | INTRAMUSCULAR | Status: AC
  Administered 2020-11-22: 40 mg via INTRAVENOUS
  Filled 2020-11-22: qty 4

## 2020-11-22 NOTE — Progress Notes (Addendum)
PROGRESS NOTE    [name] Browning  VOH:[id number] DOB: 17-Mar-1940 DOA: 11/11/2020 PCP: [place]  [id number]   Assessment & Plan:   Principal Problem:   Acute on chronic diastolic CHF (congestive heart failure) (HCC) Active Problems:   HTN (hypertension)   COPD, moderate (HCC)   Acute on chronic respiratory failure with hypoxia (HCC)   Atrial fibrillation (HCC)   DVT (deep venous thrombosis) (HCC)   HLD (hyperlipidemia)   Stroke (HCC)   CAD (coronary artery disease)   NSTEMI (non-ST elevated myocardial infarction) (HCC)   Hypokalemia   Thrombocytopenia (HCC)   Iron deficiency anemia   GI bleeding   Acute ST elevation myocardial infarction (STEMI) of anterior wall (HCC)   Cardiogenic shock ([place])   Hemorrhagic shock [facility])   [name] Browning is a 80 year old male with history of CAD (nonobstructive calcified coronary disease), atrial fibrillation on Eliquis, HFpEF, hypertension, TIA in 2016, COPD on 2 L who was admitted to the hospital on 11/2 with shortness of breath.  He is admitted to the hospital 2 times in the last month.   First admission was from 10/20/2020 to 10/31/2020 when he was admitted with respiratory failure due to COPD and pneumonia, as well as an obstructive left renal stone.  He developed hypotension and worsening respiratory distress requiring pressors and BiPAP.     Blood cultures grew positive for Enterococcus faecalis and staph hemolyticus.  He was treated with antibiotics ending 11/03/2020.  The patient was recently admitted to the hospital from 10/25-10/28 with complaints of dysphagia.     He was seen by gastroenterology who underwent an EGD with no significant findings.  Since that time he has had progressive worsening shortness of breath, eventually presented to the emergency department where he was found to have an oxygen saturation of 88% on his home 2 L of oxygen.    Recurrent chest pain, likely anginal, resolved --brief episodes, responsive to SL  nitro.  started on Imdur 15 mg by cardiology.  No chest pain for the past 2 days. --cont Imdur 15 mg daily  Bradycardia, intermittent --mild sick sinus syndrome not symptomatic at this time mainly in the setting of vagal maneuvers including urination and defecation. --avoid beta-blocker or other rate control agents  ACUTE on chronic hypoxic respiratory failure on 2L at baseline --2-4L requirement --Continue supplemental O2 to keep sats between 88-92%   Acute Anterior STEMI  Hx of Ischemic Cardiomyopathy - CODE STEMI CALLED 11/5 with ST elevations V2,V3.  cardiac catheterization showed no significant obstruction in the LAD with occluded RCA and collaterals from LAD to RCA.  It appeared that RCA was chronically occluded. No intervention. --s/p heparin gtt Plan: --cont ASA and statin --cont to hold beta blocker due to hypotension and intermittent bradycardia --cont Imdur 15 mg daily (new)  Acute on chronic diastolic CHF Acute on chronic right heart failure Echo 11/6: LVEF 60-65%, LV regional wall motion abnormalities, Grade 1 diastolic dysfunction, RV systolic function severely reduced with RV severe enlargement, LA + RA severely dilated --developing pitting edema and scrotal swelling Plan: --IV lasix 40 mg x2 today --Strict I/O   CARDIOGENIC SHOCK, resolved Hypotension, improved --weaned off Levophed and dopamine as of morning of 11/9 --cont midodrine for now while on IV diuresis    Anemia, acute on chronic Iron deficiency versus ? Acute blood loss secondary to GI bleed Presented with dark-colored stools and stool occult positive, noted drop in hgb which normalized s/p pRBC transfusion.  - GI following --s/p 2u  pRBC Plan: --no plan for endoscopic procedures at this time, per GI --cont oral PPI   ACUTE KIDNEY INJURY --dehydration, contrast.  Cr improved with gentle IVF hydration. --monitor Cr while diuresing  COPD --cont daily bronchodilators  Dysuria --started on  ceftriaxone today by night team.  Urine cx added on later, pos for E coli --cont ceftriaxone, day 3 of 3   DVT prophylaxis: TZ:GYFVCBS Code Status: Full code  Family Communication:  Level of care: stepdown Dispo:   The patient is from: home Anticipated d/c is to: home with [facility] (pt and family refused SNF rehab)  Anticipated d/c date is: 1-2 days Patient currently is not medically ready to d/c due to: IV lasix   Subjective and Interval History:  No more chest pain.  Pt reported breathing at baseline, however, swelling noted to be increased, in his BLE and penile/scrotal area.  Pt reported eating like a horse now, ate a cheeseburger last night.   Objective: Vitals:   11/22/20 0422 11/22/20 0625 11/22/20 0747 11/22/20 1102  BP: 111/67  (!) 115/58 114/65  Pulse: 61  70 77  Resp: 20  20 18   Temp: 97.9 F (36.6 C)  98 F (36.7 C) 97.6 F (36.4 C)  TempSrc:    Oral  SpO2: 96%  98% 94%  Weight:  94.1 kg    Height:        Intake/Output Summary (Last 24 hours) at 11/22/2020 1506 Last data filed at 11/22/2020 1433 Gross per 24 hour  Intake 954.71 ml  Output 1100 ml  Net -145.29 ml   Filed Weights   11/20/20 0437 11/21/20 0208 11/22/20 0625  Weight: 94.4 kg 94.2 kg 94.1 kg    Examination:   Constitutional: NAD, AAOx3 HEENT: conjunctivae and lids normal, EOMI CV: No cyanosis.   RESP: normal respiratory effort, on 2L Extremities: 2+ pitting edema in BLE SKIN: warm, dry Neuro: II - XII grossly intact.   Psych: Normal mood and affect.  Appropriate judgement and reason   Data Reviewed: I have personally reviewed following labs and imaging studies  CBC: Recent Labs  Lab 11/18/20 0506 11/19/20 0503 11/20/20 0432 11/21/20 0423 11/22/20 0637  WBC 8.6 8.9 12.1* 11.8* 9.3  HGB 9.9* 9.9* 9.5* 9.4* 9.2*  HCT 30.6* 30.2* 30.4* 30.1* 29.3*  MCV 89.5 91.0 91.8 93.8 90.7  PLT 140* 117* 114* 120* 496*   Basic Metabolic Panel: Recent Labs  Lab 11/16/20 0510  11/17/20 0514 11/18/20 0506 11/19/20 0503 11/20/20 0432 11/21/20 0423 11/22/20 0637  NA 135 132* 133* 131* 131* 132* 133*  K 4.0 3.8 4.1 4.3 5.4* 5.1 4.6  CL 98 96* 96* 95* 95* 97* 98  CO2 28 28 28 28 29 27 28   GLUCOSE 133* 114* 120* 117* 133* 185* 137*  BUN 44* 44* 56* 50* 51* 41* 37*  CREATININE 2.01* 1.67* 2.28* 1.88* 1.58* 1.48* 1.24  CALCIUM 8.1* 8.2* 8.6* 8.5* 8.5* 8.6* 8.6*  MG 2.3  --   --  2.6* 2.2 2.3 2.1  PHOS 4.5 4.1  --   --   --   --   --    GFR: Estimated Creatinine Clearance: 56.2 mL/min (by C-G formula based on SCr of 1.24 mg/dL). Liver Function Tests: Recent Labs  Lab 11/16/20 0510 11/17/20 0514  ALBUMIN 3.1* 2.8*   No results for input(s): LIPASE, AMYLASE in the last 168 hours. No results for input(s): AMMONIA in the last 168 hours. Coagulation Profile: No results for input(s): INR, PROTIME in the last  168 hours. Cardiac Enzymes: No results for input(s): CKTOTAL, CKMB, CKMBINDEX, TROPONINI in the last 168 hours. BNP (last 3 results) No results for input(s): PROBNP in the last 8760 hours. HbA1C: No results for input(s): HGBA1C in the last 72 hours. CBG: Recent Labs  Lab 11/20/20 0608  GLUCAP 111*   Lipid Profile: No results for input(s): CHOL, HDL, LDLCALC, TRIG, CHOLHDL, LDLDIRECT in the last 72 hours. Thyroid Function Tests: No results for input(s): TSH, T4TOTAL, FREET4, T3FREE, THYROIDAB in the last 72 hours. Anemia Panel: No results for input(s): VITAMINB12, FOLATE, FERRITIN, TIBC, IRON, RETICCTPCT in the last 72 hours. Sepsis Labs: No results for input(s): PROCALCITON, LATICACIDVEN in the last 168 hours.   Recent Results (from the past 240 hour(s))  CULTURE, BLOOD (ROUTINE X 2) w Reflex to ID Panel     Status: None (Preliminary result)   Collection Time: 11/20/20  8:44 AM   Specimen: BLOOD  Result Value Ref Range Status   Specimen Description BLOOD BLOOD RIGHT WRIST  Final   Special Requests   Final    BOTTLES DRAWN AEROBIC AND  ANAEROBIC Blood Culture adequate volume   Culture   Final    NO GROWTH 2 DAYS Performed at [facility], [address] [place], [place] [address]    Report Status PENDING  Incomplete  CULTURE, BLOOD (ROUTINE X 2) w Reflex to ID Panel     Status: None (Preliminary result)   Collection Time: 11/20/20  8:44 AM   Specimen: BLOOD  Result Value Ref Range Status   Specimen Description BLOOD BLOOD LEFT HAND  Final   Special Requests   Final    BOTTLES DRAWN AEROBIC AND ANAEROBIC Blood Culture adequate volume   Culture   Final    NO GROWTH 2 DAYS Performed at [facility], [address] [place], [place] [address]    Report Status PENDING  Incomplete  Urine Culture     Status: Abnormal (Preliminary result)   Collection Time: 11/20/20  2:09 PM   Specimen: Urine, Random  Result Value Ref Range Status   Specimen Description   Final    URINE, RANDOM Performed at [facility], [address], [place], [place] [address]    Special Requests   Final    NONE Performed at [facility], [address] [place], [place] [address]    Culture (A)  Final    >=100,000 COLONIES/mL ESCHERICHIA COLI SUSCEPTIBILITIES TO FOLLOW Performed at [facility], [place] [address] [place],  [address]    Report Status PENDING  Incomplete      Radiology Studies: No results found.   Scheduled Meds:  (feeding supplement) PROSource Plus  30 mL Oral TID BM   sodium chloride   Intravenous Once   apixaban  5 mg Oral BID   aspirin EC  81 mg Oral Daily   atorvastatin  40 mg Oral QPC supper   Chlorhexidine Gluconate Cloth  6 each Topical Daily   feeding supplement  1 Container Oral TID BM   ferrous gluconate  324 mg Oral QPC supper   guaiFENesin  15 mL Oral QID   isosorbide mononitrate  15 mg Oral Daily   midodrine  10 mg Oral TID WC   mometasone-formoterol  2 puff Inhalation BID   multivitamin with minerals  1 tablet Oral Daily   pantoprazole  40 mg Oral  BID   sodium chloride flush  10-40 mL Intracatheter Q12H   sodium chloride flush  3 mL Intravenous Q12H   tamsulosin  0.4 mg Oral Daily   Continuous Infusions:  sodium chloride     cefTRIAXone (ROCEPHIN)  IV 1 g (11/22/20 1001)   promethazine (PHENERGAN) injection (IM or IVPB)       LOS: 11 days     Enzo Bi, MD [facility] If 7PM-7AM, please contact night-coverage 11/22/2020, 3:06 PM

## 2020-11-22 NOTE — Progress Notes (Signed)
[facility] Encounter Note  Patient: [name] Browning / Admit Date: 11/11/2020 / Date of Encounter: 11/22/2020, 12:00 PM   Subjective: 11/11.  Improved since patient but still having multiple issues.  The patient has had known coronary artery disease with angina exacerbated by recent infection and some chronic hypoxia requiring oxygenation.  The patient has had episodes every morning of some anginal symptoms will and some bradycardia typically surrounding going to the bathroom.  It appears that he may be getting vagal as well with some bradycardia into the 20 bpm range but no evidence of syncope just dizziness.  11/12.  Patient has slept overnight with no evidence of chest discomfort and feelingWell this morning with no evidence of other significant cardiovascular symptoms.  Patient appears to have had reasonable blood pressure control with addition of isosorbide for chest comfort.  There has been no evidence of telemetry changes consistent with sick sinus syndrome at this time    11/13.  The patient has had improvements of his blood pressure heart rate and chest pain.  There is been no evidence of significant chest pain since addition of nitrates.  The patient though has significant lower extremity and scrotal edema of unknown etiology although may be secondary to continued multiple factors.  Cardiac catheterization showing minimal atherosclerosis of left anterior descending artery and circumflex with occluded right coronary artery with collateralization which appears to be old and not amenable to PCI and/or stent placement.  Therefore medication management is appropriate  Patient has been relatively hypotensive and therefore cannot use antianginal medications to the fullest amount but will continue to work in the appropriate direction  Review of Systems: Positive for: Shortness of breath chest pain Negative for: Vision change, hearing change, syncope, dizziness, nausea,  vomiting,diarrhea, bloody stool, stomach pain, cough, congestion, diaphoresis, urinary frequency, urinary pain,skin lesions, skin rashes Others previously listed  Objective: Telemetry: Atrial fibrillation with controlled ventricular rate Physical Exam: Blood pressure 114/65, pulse 77, temperature 97.6 F (36.4 C), temperature source Oral, resp. rate 18, height 6\' 2"  (1.88 m), weight 94.1 kg, SpO2 94 %. Body mass index is 26.64 kg/m. General: Well developed, well nourished, in no acute distress. Head: Normocephalic, atraumatic, sclera non-icteric, no xanthomas, nares are without discharge. Neck: No apparent masses Lungs: Normal respirations with no wheezes, no rhonchi, no rales , no crackles   Heart: irregular rate and rhythm, normal S1 S2, no murmur, no rub, no gallop, PMI is normal size and placement, carotid upstroke normal without bruit, jugular venous pressure normal Abdomen: Soft, non-tender, non-distended with normoactive bowel sounds. No hepatosplenomegaly. Abdominal aorta is normal size without bruit Extremities: 1+ edema, no clubbing, no cyanosis, no ulcers,  Peripheral: 2+ radial, 2+ femoral, 2+ dorsal pedal pulses Neuro: Alert and oriented. Moves all extremities spontaneously. Psych:  Responds to questions appropriately with a normal affect.   Intake/Output Summary (Last 24 hours) at 11/22/2020 1200 Last data filed at 11/22/2020 0900 Gross per 24 hour  Intake 594.71 ml  Output 550 ml  Net 44.71 ml     Inpatient Medications:  . (feeding supplement) PROSource Plus  30 mL Oral TID BM  . sodium chloride   Intravenous Once  . apixaban  5 mg Oral BID  . aspirin EC  81 mg Oral Daily  . atorvastatin  40 mg Oral QPC supper  . Chlorhexidine Gluconate Cloth  6 each Topical Daily  . feeding supplement  1 Container Oral TID BM  . ferrous gluconate  324 mg Oral QPC  supper  . guaiFENesin  15 mL Oral QID  . isosorbide mononitrate  15 mg Oral Daily  . midodrine  10 mg Oral TID WC   . mometasone-formoterol  2 puff Inhalation BID  . multivitamin with minerals  1 tablet Oral Daily  . pantoprazole  40 mg Oral BID  . sodium chloride flush  10-40 mL Intracatheter Q12H  . sodium chloride flush  3 mL Intravenous Q12H  . tamsulosin  0.4 mg Oral Daily   Infusions:  . sodium chloride    . cefTRIAXone (ROCEPHIN)  IV 1 g (11/22/20 1001)  . promethazine (PHENERGAN) injection (IM or IVPB)      Labs: Recent Labs    11/21/20 0423 11/22/20 0637  NA 132* 133*  K 5.1 4.6  CL 97* 98  CO2 27 28  GLUCOSE 185* 137*  BUN 41* 37*  CREATININE 1.48* 1.24  CALCIUM 8.6* 8.6*  MG 2.3 2.1    No results for input(s): AST, ALT, ALKPHOS, BILITOT, PROT, ALBUMIN in the last 72 hours. Recent Labs    11/21/20 0423 11/22/20 0637  WBC 11.8* 9.3  HGB 9.4* 9.2*  HCT 30.1* 29.3*  MCV 93.8 90.7  PLT 120* 148*    No results for input(s): CKTOTAL, CKMB, TROPONINI in the last 72 hours. Invalid input(s): POCBNP No results for input(s): HGBA1C in the last 72 hours.   Weights: Filed Weights   11/20/20 0437 11/21/20 0208 11/22/20 0625  Weight: 94.4 kg 94.2 kg 94.1 kg     Radiology/Studies:  DG Abd 1 View  Result Date: 11/18/2020 CLINICAL DATA:  Nausea and vomiting. EXAM: ABDOMEN - 1 VIEW COMPARISON:  10/20/2020. FINDINGS: Air is seen in the stomach. Gas in nondistended small bowel and colon. Gas in the rectum. Possible left renal stone. Coarsened basilar pulmonary markings. IMPRESSION: 1. No acute findings. 2. Possible left renal stone. 3. Coarsened basilar pulmonary markings. Electronically Signed   By: Lorin Picket M.D.   On: 11/18/2020 10:35   CT HEAD WO CONTRAST (5MM)  Result Date: 11/05/2020 CLINICAL DATA:  Transient ischemic attack (TIA) EXAM: CT HEAD WITHOUT CONTRAST TECHNIQUE: Contiguous axial images were obtained from the base of the skull through the vertex without intravenous contrast. COMPARISON:  None. FINDINGS: Brain: There is no acute intracranial hemorrhage, mass  effect, or edema. Gray-white differentiation is preserved. There is no extra-axial fluid collection. Ventricles and sulci are within normal limits in size and configuration. Vascular: There is atherosclerotic calcification at the skull base. Skull: Calvarium is unremarkable. Sinuses/Orbits: No acute finding. Other: None. IMPRESSION: No acute intracranial abnormality. Electronically Signed   By: Macy Mis M.D.   On: 11/05/2020 14:20   CT ABDOMEN PELVIS W CONTRAST  Result Date: 11/03/2020 CLINICAL DATA:  Nausea and vomiting EXAM: CT ABDOMEN AND PELVIS WITH CONTRAST TECHNIQUE: Multidetector CT imaging of the abdomen and pelvis was performed using the standard protocol following bolus administration of intravenous contrast. CONTRAST:  148mL OMNIPAQUE IOHEXOL 300 MG/ML  SOLN COMPARISON:  CT abdomen and pelvis dated October 20, 2020 FINDINGS: Lower chest: Cardiomegaly. Emphysema. Solid pulmonary nodules of the left lower lobe. Largest measures up to 7 mm and is located on series 2, image 98. Hepatobiliary: No focal liver abnormality is seen. No gallstones, gallbladder wall thickening, or biliary dilatation. Pancreas: Unremarkable. No pancreatic ductal dilatation or surrounding inflammatory changes. Spleen: Normal in size without focal abnormality. Adrenals/Urinary Tract: Bilateral adrenal glands are unremarkable. No hydronephrosis. Bilateral nonobstructing stones, left greater than right. Bladder is unremarkable. Stomach/Bowel: Stomach is  within normal limits. Appendix appears normal. Diverticulosis. No evidence of bowel wall thickening, distention, or inflammatory changes. Vascular/Lymphatic: Aortic atherosclerosis. No enlarged abdominal or pelvic lymph nodes. Reproductive: Prostatomegaly, measuring up to 5.8 cm. Other: Small right greater than left fat containing inguinal hernias. Musculoskeletal: Unchanged L4 compression deformity. No aggressive appearing osseous lesions. IMPRESSION: Diverticulosis with no  evidence of diverticulitis. No acute findings abdomen or pelvis. Solid pulmonary nodules of the left lower lobe, largest measures up to 7 mm. Non-contrast chest CT at 6-12 months is recommended. If the nodule is stable at time of repeat CT, then future CT at 18-24 months (from today's scan) is considered optional for low-risk patients, but is recommended for high-risk patients. This recommendation follows the consensus statement: Guidelines for Management of Incidental Pulmonary Nodules Detected on CT Images: From the Fleischner Society 2017; Radiology 2017; 284:228-243. Aortic Atherosclerosis (ICD10-I70.0) and Emphysema (ICD10-J43.9). Electronically Signed   By: Yetta Glassman M.D.   On: 11/03/2020 15:48   CARDIAC CATHETERIZATION  Result Date: 11/14/2020 .  Ost RCA lesion is 95% stenosed. .  Prox RCA to Dist RCA lesion is 100% stenosed. -Very minimal flow.  After initial angiography, it disappeared to progressed to the ostium. .  Left-to-right collaterals via septal perforators fill the PDA system. .  Mid LM to Dist LM lesion is 20% stenosed. Colon Flattery LAD to Prox LAD lesion is 25% stenosed.  Relatively small caliber vessel that wraps the apex. .  Diminutive LCx with moderate sized OM1, mild disease. .  The left ventricular systolic function is normal. No obvious regional wall motion normalities. .  The left ventricular ejection fraction is 55-65% by visual estimate. .  LV end diastolic pressure is moderately elevated.  18 mmHg .  There is no aortic valve stenosis. SUMMARY Cardiogenic shock-SCAI scale A-B => not favorable for mechanical support due to ongoing anemia, lack of active anginal symptoms and preserved EF with minimally elevated LVEDP. Severe single-vessel CAD with 95% ostial RCA and 1% flush occlusion of the proximal RCA- Unable to engage with guide catheter to wire the vessel. Does not correlate with anterior elevations and no wall motion normalities Minimal diffuse LAD disease with left-to-right  collaterals filling the PDA and part of the PL system. Preserved LVEF of 50 to 55% with no obvious wall motion abnormalities. Successful Right IJ Triple-Lumen Catheter placed RECOMMENDATIONS Would not treat with antiplatelet agent given total occluded vessel and need for DOAC for A. fib.  Hold off on anticoagulation until hemodynamically stable Continue to titrate Levophed and dopamine for blood pressure support Transfuse to keep hemoglobin greater than 10 Consider possibility of RV involvement as potential cause of shock. Glenetta Hew, MD  US Venous Img Lower Bilateral (DVT)  Result Date: 11/11/2020 CLINICAL DATA:  Shortness of breath EXAM: Bilateral LOWER EXTREMITY VENOUS DOPPLER ULTRASOUND TECHNIQUE: Gray-scale sonography with compression, as well as color and duplex ultrasound, were performed to evaluate the deep venous system(s) from the level of the common femoral vein through the popliteal and proximal calf veins. COMPARISON:  None. FINDINGS: VENOUS Normal compressibility of the common femoral, superficial femoral, and popliteal veins, as well as the visualized calf veins. Visualized portions of profunda femoral vein and great saphenous vein unremarkable. No filling defects to suggest DVT on grayscale or color Doppler imaging. Doppler waveforms show normal direction of venous flow, normal respiratory plasticity and response to augmentation. Limited views of the contralateral common femoral vein are unremarkable. OTHER None. Limitations: none IMPRESSION: Negative. Electronically Signed  By: Merilyn Baba M.D.   On: 11/11/2020 18:07   DG Chest Port 1 View  Result Date: 11/15/2020 CLINICAL DATA:  Acute respiratory failure with hypoxia EXAM: PORTABLE CHEST 1 VIEW COMPARISON:  Chest radiograph from one day prior. FINDINGS: Right internal jugular central venous catheter terminates at the cavoatrial junction. Pacer pads overlie the left chest. Stable cardiomediastinal silhouette with mild cardiomegaly. No  pneumothorax. No pleural effusion. Mild pulmonary edema, similar. Mild to moderate bibasilar atelectasis, worsened. IMPRESSION: 1. Stable mild congestive heart failure. 2. Mild to moderate bibasilar atelectasis, worsened. Electronically Signed   By: Ilona Sorrel M.D.   On: 11/15/2020 07:08   DG Chest Port 1 View  Result Date: 11/14/2020 CLINICAL DATA:  Central line placement. Hypoxia. Congestive heart failure. EXAM: PORTABLE CHEST 1 VIEW COMPARISON:  Prior today FINDINGS: A new right jugular central venous catheter is seen with tip overlying the proximal right atrium. No evidence of pneumothorax. Mild cardiomegaly stable. Diffuse interstitial infiltrates remains stable. Loculated fluid within the left major fissure is stable, and mild atelectasis or scarring is again seen in the right upper lobe. IMPRESSION: New right jugular central venous catheter tip overlies the proximal right atrium. No evidence of pneumothorax. Stable cardiomegaly and diffuse interstitial infiltrates/edema. Stable small loculated left pleural effusion, and right upper lobe atelectasis versus scarring. Electronically Signed   By: Marlaine Hind M.D.   On: 11/14/2020 14:11   DG Chest Port 1 View  Result Date: 11/14/2020 CLINICAL DATA:  Hypoxia EXAM: PORTABLE CHEST 1 VIEW COMPARISON:  11/11/2020 chest radiograph. FINDINGS: Stable cardiomediastinal silhouette with mild cardiomegaly. No pneumothorax. Focal opacity overlying the peripheral mid to upper left lung appears more discrete. No right pleural effusion. Similar mild pulmonary edema. Stable small focal lung opacity in the peripheral upper right lung. IMPRESSION: 1. Similar mild congestive heart failure. 2. Focal opacity overlying the peripheral mid to upper left lung appears more discrete. Stable smaller focal peripheral upper right lung opacity. These may represent loculated pleural effusions and/or pulmonary nodules. Suggest further evaluation with noncontrast chest CT. Electronically  Signed   By: Ilona Sorrel M.D.   On: 11/14/2020 07:26   DG Chest Portable 1 View  Result Date: 11/11/2020 CLINICAL DATA:  Shortness of breath EXAM: PORTABLE CHEST 1 VIEW COMPARISON:  Previous studies including the examination of 10/29/2020 FINDINGS: Transverse diameter of heart is slightly increased. Thoracic aorta is tortuous and ectatic. There is prominence of interstitial markings in the parahilar regions with interval worsening. There is faint haziness in the lateral aspect of left mid and both lower lung fields. There is a small smooth marginated radiopacity, possibly in the lateral aspect of minor fissure in the right mid lung fields. There is pleural thickening in both apices, more so on the left side. There is no pneumothorax. Lateral CP angles are clear. IMPRESSION: There is increase in interstitial markings in the parahilar regions suggesting interstitial edema or interstitial pneumonitis. Part of this finding is probably underlying scarring. There is diffuse haziness in the lateral aspect of left mid and both lower lung fields which may be due to pleural thickening or loculated pleural effusions or pneumonia. Follow-up chest radiographs and CT as clinically warranted should be considered. Electronically Signed   By: Elmer Picker M.D.   On: 11/11/2020 13:38   DG Chest Port 1 View  Result Date: 10/29/2020 CLINICAL DATA:  Shortness of breath EXAM: PORTABLE CHEST 1 VIEW COMPARISON:  10/27/2020, 10/23/2020, 08/27/2017,, CT 09/19/2018 FINDINGS: Emphysematous disease. Mild reticular opacity at the  bases likely due to fibrosis. Slight decreased interstitial opacity since 10/27/2020. No new confluent airspace disease, pleural effusion or pneumothorax. Apical pleural thickening. Stable cardiomediastinal silhouette with aortic atherosclerosis. IMPRESSION: 1. Underlying chronic lung disease/emphysema with overall decreased interstitial opacity since 10/27/2020 suggesting resolving superimposed edema  or inflammatory process 2. No new confluent airspace disease Electronically Signed   By: Donavan Foil M.D.   On: 10/29/2020 15:39   DG Chest Port 1 View  Result Date: 10/27/2020 CLINICAL DATA:  Chronic hypoxemia, COPD presents with weakness and shortness of breath EXAM: PORTABLE CHEST 1 VIEW COMPARISON:  Chest radiograph 10/23/2020 FINDINGS: The cardiomediastinal silhouette is stable. There are diffusely coarsened interstitial markings throughout both lungs, overall not significantly changed since 10/23/2020. There is no new or worsening focal airspace disease. There is no pleural effusion. There is no pneumothorax. There is no acute osseous abnormality. IMPRESSION: Unchanged coarsened interstitial markings throughout both lungs since 10/23/2020 though these are increased since 10/06/2020. Findings may reflect edema or infection superimposed on chronic changes. Electronically Signed   By: Valetta Mole M.D.   On: 10/27/2020 09:05   DG Chest Port 1 View  Result Date: 10/23/2020 CLINICAL DATA:  Respiratory failure, increasing shortness of breath EXAM: PORTABLE CHEST 1 VIEW COMPARISON:  10/20/2020 FINDINGS: Unchanged mildly enlarged cardiac contour. Redemonstrated diffuse airspace and interstitial opacities bilaterally, which appear largely unchanged compared to the prior exam. No definite pleural effusion. Aortic calcifications. No acute osseous abnormality. IMPRESSION: Unchanged bilateral pulmonary opacities, which could represent edema or infection. Electronically Signed   By: Merilyn Baba M.D.   On: 10/23/2020 17:49   ECHOCARDIOGRAM COMPLETE  Result Date: 11/15/2020    ECHOCARDIOGRAM REPORT   Patient Name:   AC COLAN Date of Exam: 11/15/2020 Medical Rec #:  [id number]     Height:       74.0 in Accession #:    [id number]    Weight:       190.5 lb Date of Birth:  25-Aug-1940     BSA:          2.129 m Patient Age:    80 years      BP:           105/60 mmHg Patient Gender: M             HR:            81 bpm. Exam Location:  [facility] Procedure: 2D Echo Indications:     Myocardial Infarct I21.9  History:         Patient has prior history of Echocardiogram examinations, most                  recent 10/23/2020.  Sonographer:     Kathlen Brunswick RDCS Referring Phys:  [place] Diagnosing Phys: [place]  1. Left ventricular ejection fraction, by estimation, is 60 to 65%. The left ventricle has normal function. The left ventricle demonstrates regional wall motion abnormalities (see scoring diagram/findings for description). Left ventricular diastolic parameters are consistent with Grade I diastolic dysfunction (impaired relaxation).  2. Right ventricular systolic function is severely reduced. The right ventricular size is severely enlarged.  3. Left atrial size was severely dilated.  4. Right atrial size was severely dilated.  5. The mitral valve is normal in structure. Mild mitral valve regurgitation. No evidence of mitral stenosis. Moderate mitral annular calcification.  6. The aortic valve is normal in structure. Aortic valve regurgitation is not visualized. Mild aortic valve  sclerosis is present, with no evidence of aortic valve stenosis.  7. The inferior vena cava is normal in size with greater than 50% respiratory variability, suggesting right atrial pressure of 3 mmHg. FINDINGS  Left Ventricle: Left ventricular ejection fraction, by estimation, is 60 to 65%. The left ventricle has normal function. The left ventricle demonstrates regional wall motion abnormalities. The left ventricular internal cavity size was normal in size. There is no left ventricular hypertrophy. Left ventricular diastolic parameters are consistent with Grade I diastolic dysfunction (impaired relaxation). Right Ventricle: The right ventricular size is severely enlarged. No increase in right ventricular wall thickness. Right ventricular systolic function is severely reduced. Left Atrium: Left atrial size was severely  dilated. Right Atrium: Right atrial size was severely dilated. Pericardium: There is no evidence of pericardial effusion. Mitral Valve: The mitral valve is normal in structure. Moderate mitral annular calcification. Mild mitral valve regurgitation. No evidence of mitral valve stenosis. Tricuspid Valve: The tricuspid valve is normal in structure. Tricuspid valve regurgitation is mild . No evidence of tricuspid stenosis. Aortic Valve: The aortic valve is normal in structure. Aortic valve regurgitation is not visualized. Mild aortic valve sclerosis is present, with no evidence of aortic valve stenosis. Aortic valve peak gradient measures 5.5 mmHg. Pulmonic Valve: The pulmonic valve was normal in structure. Pulmonic valve regurgitation is not visualized. No evidence of pulmonic stenosis. Aorta: The aortic root is normal in size and structure. Venous: The inferior vena cava is normal in size with greater than 50% respiratory variability, suggesting right atrial pressure of 3 mmHg. IAS/Shunts: No atrial level shunt detected by color flow Doppler.  LEFT VENTRICLE PLAX 2D LVIDd:         3.74 cm   Diastology LVIDs:         2.69 cm   LV e' medial:    5.77 cm/s LV PW:         1.26 cm   LV E/e' medial:  20.8 LV IVS:        1.18 cm   LV e' lateral:   7.83 cm/s LVOT diam:     1.80 cm   LV E/e' lateral: 15.3 LV SV:         29 LV SV Index:   14 LVOT Area:     2.54 cm  RIGHT VENTRICLE RV Basal diam:  4.32 cm RV S prime:     4.79 cm/s TAPSE (M-mode): 1.1 cm LEFT ATRIUM             Index        RIGHT ATRIUM           Index LA diam:        3.90 cm 1.83 cm/m   RA Area:     18.80 cm LA Vol (A2C):   42.4 ml 19.92 ml/m  RA Volume:   56.50 ml  26.54 ml/m LA Vol (A4C):   36.0 ml 16.91 ml/m LA Biplane Vol: 40.7 ml 19.12 ml/m  AORTIC VALVE                 PULMONIC VALVE AV Area (Vmax): 1.65 cm     PV Vmax:       0.94 m/s AV Vmax:        117.00 cm/s  PV Peak grad:  3.5 mmHg AV Peak Grad:   5.5 mmHg LVOT Vmax:      75.80 cm/s LVOT Vmean:      44.200 cm/s LVOT VTI:  0.113 m  AORTA Ao Root diam: 3.20 cm Ao Asc diam:  2.70 cm MITRAL VALVE                TRICUSPID VALVE MV Area (PHT): 3.76 cm     TV Peak grad:   17.4 mmHg MV Decel Time: 202 msec     TV Vmax:        2.08 m/s MV E velocity: 120.00 cm/s                             SHUNTS                             Systemic VTI:  0.11 m                             Systemic Diam: 1.80 cm Neoma Laming Electronically signed by Neoma Laming Signature Date/Time: 11/15/2020/10:38:00 AM    Final    ECHOCARDIOGRAM COMPLETE  Result Date: 10/23/2020    ECHOCARDIOGRAM REPORT   Patient Name:   RAMESH MOAN Date of Exam: 10/23/2020 Medical Rec #:  [id number]     Height:       74.0 in Accession #:    [id number]    Weight:       180.0 lb Date of Birth:  [birth year]     BSA:          2.078 m Patient Age:    40 years      BP:           133/80 mmHg Patient Gender: M             HR:           95 bpm. Exam Location:  [facility] Procedure: 2D Echo, Cardiac Doppler and Color Doppler Indications:     CHF-acute diastolic Y40.34  History:         Patient has prior history of Echocardiogram examinations, most                  recent 04/20/2015. Angina, COPD and TIA; Signs/Symptoms:Syncope.  Sonographer:     Sherrie Sport Referring Phys:  [id number] [place] Diagnosing Phys: Ida Rogue MD  Sonographer Comments: Suboptimal apical window. IMPRESSIONS  1. Left ventricular ejection fraction, by estimation, is 60 to 65%. The left ventricle has normal function. The left ventricle has no regional wall motion abnormalities. Left ventricular diastolic parameters are indeterminate.  2. Right ventricular systolic function is normal. The right ventricular size is normal. There is mildly elevated pulmonary artery systolic pressure.  3. Rhythm is atrial fibrillation  4. Left atrial size was moderately dilated. FINDINGS  Left Ventricle: Left ventricular ejection fraction, by estimation, is 60 to 65%. The left ventricle has normal function. The  left ventricle has no regional wall motion abnormalities. The left ventricular internal cavity size was normal in size. There is  no left ventricular hypertrophy. Left ventricular diastolic parameters are indeterminate. Right Ventricle: The right ventricular size is normal. No increase in right ventricular wall thickness. Right ventricular systolic function is normal. There is mildly elevated pulmonary artery systolic pressure. The tricuspid regurgitant velocity is 2.95  m/s, and with an assumed right atrial pressure of 5 mmHg, the estimated right ventricular systolic pressure is 38.7 mmHg. Left Atrium: Left atrial size was moderately dilated. Right Atrium: Right atrial size  was normal in size. Pericardium: There is no evidence of pericardial effusion. Mitral Valve: The mitral valve is normal in structure. There is mild thickening of the mitral valve leaflet(s). No evidence of mitral valve regurgitation. No evidence of mitral valve stenosis. Tricuspid Valve: The tricuspid valve is normal in structure. Tricuspid valve regurgitation is mild . No evidence of tricuspid stenosis. Aortic Valve: The aortic valve is normal in structure. Aortic valve regurgitation is not visualized. No aortic stenosis is present. Aortic valve mean gradient measures 2.0 mmHg. Aortic valve peak gradient measures 4.7 mmHg. Aortic valve area, by VTI measures 3.04 cm. Pulmonic Valve: The pulmonic valve was normal in structure. Pulmonic valve regurgitation is not visualized. No evidence of pulmonic stenosis. Aorta: The aortic root is normal in size and structure. Venous: The inferior vena cava is normal in size with greater than 50% respiratory variability, suggesting right atrial pressure of 3 mmHg. IAS/Shunts: No atrial level shunt detected by color flow Doppler.  LEFT VENTRICLE PLAX 2D LVIDd:         4.19 cm LVIDs:         2.93 cm LV PW:         1.33 cm LV IVS:        0.95 cm LVOT diam:     2.00 cm LV SV:         55 LV SV Index:   26 LVOT  Area:     3.14 cm  RIGHT VENTRICLE RV Basal diam:  4.55 cm RV S prime:     7.51 cm/s TAPSE (M-mode): 3.8 cm LEFT ATRIUM              Index        RIGHT ATRIUM           Index LA diam:        3.50 cm  1.68 cm/m   RA Area:     26.60 cm LA Vol (A2C):   93.4 ml  44.94 ml/m  RA Volume:   96.30 ml  46.34 ml/m LA Vol (A4C):   128.0 ml 61.59 ml/m LA Biplane Vol: 113.0 ml 54.37 ml/m  AORTIC VALVE                    PULMONIC VALVE AV Area (Vmax):    2.83 cm     PV Vmax:        0.96 m/s AV Area (Vmean):   2.84 cm     PV Peak grad:   3.6 mmHg AV Area (VTI):     3.04 cm     RVOT Peak grad: 4 mmHg AV Vmax:           108.00 cm/s AV Vmean:          70.100 cm/s AV VTI:            0.181 m AV Peak Grad:      4.7 mmHg AV Mean Grad:      2.0 mmHg LVOT Vmax:         97.30 cm/s LVOT Vmean:        63.300 cm/s LVOT VTI:          0.175 m LVOT/AV VTI ratio: 0.97  AORTA Ao Root diam: 3.30 cm MITRAL VALVE                TRICUSPID VALVE MV Area (PHT): 4.52 cm     TR Peak grad:   34.8 mmHg MV Decel Time: 168 msec  TR Vmax:        295.00 cm/s MV E velocity: 134.00 cm/s                             SHUNTS                             Systemic VTI:  0.18 m                             Systemic Diam: 2.00 cm Ida Rogue MD Electronically signed by Ida Rogue MD Signature Date/Time: 10/23/2020/5:30:24 PM    Final      Assessment and Recommendation  80 y.o. male with known coronary artery disease with occluded right coronary artery with collateralization and not amenable to PCI and stent placement with some hypotension infection and non-ST elevation myocardial infarction having continued episodes of chest discomfort and bradycardia with chronic nonvalvular atrial fibrillation dating continued medication management 1.  Okay for discontinuation of midodrine with improved blood pressure and less chest pain.   2.  Avoid any beta-blocker or heart rate regulating medication management due to concerns of mild sick sinus syndrome not  symptomatic at this time mainly in the setting of vagal maneuvers including urination and defecation 3.  Continuation of anticoagulation for further risk reduction and stroke with atrial fibrillation 4.  Continuation of supportive care for infection and hypoxia as per medicine 5.  Continue aspirin for further risk reduction and myocardial infarction and/or coronary artery disease with single antiplatelet therapy due to concerns of bleeding complications with concomitant use of anticoagulation 6.  High intensity cholesterol therapy 7.  No further cardiac diagnostics necessary at this time while continuing to treat above 8.   Intravenous Lasix for concerns of lower extremity edema and scrotal edema with continued hypoxia and diastolic dysfunction congestive heart failure watching closely for improvements and need for adjustments of medication management  Signed, Serafina Royals M.D. FACC

## 2020-11-22 NOTE — Plan of Care (Signed)
Patient slept well, has a small BM, penis swelling, elevated the affected area .VSS   Problem: Education: Goal: Knowledge of General Education information will improve Description: Including pain rating scale, medication(s)/side effects and non-pharmacologic comfort measures Outcome: Progressing   Problem: Health Behavior/Discharge Planning: Goal: Ability to manage health-related needs will improve Outcome: Progressing   Problem: Clinical Measurements: Goal: Ability to maintain clinical measurements within normal limits will improve Outcome: Progressing Goal: Will remain free from infection Outcome: Progressing Goal: Diagnostic test results will improve Outcome: Progressing Goal: Respiratory complications will improve Outcome: Progressing Goal: Cardiovascular complication will be avoided Outcome: Progressing

## 2020-11-23 DIAGNOSIS — I5033 Acute on chronic diastolic (congestive) heart failure: Secondary | ICD-10-CM | POA: Diagnosis not present

## 2020-11-23 LAB — BASIC METABOLIC PANEL
Anion gap: 7 (ref 5–15)
BUN: 36 mg/dL — ABNORMAL HIGH (ref 8–23)
CO2: 29 mmol/L (ref 22–32)
Calcium: 8.4 mg/dL — ABNORMAL LOW (ref 8.9–10.3)
Chloride: 97 mmol/L — ABNORMAL LOW (ref 98–111)
Creatinine, Ser: 1.23 mg/dL (ref 0.61–1.24)
GFR, Estimated: 60 mL/min — ABNORMAL LOW (ref >60)
Glucose, Bld: 109 mg/dL — ABNORMAL HIGH (ref 70–99)
Potassium: 4.3 mmol/L (ref 3.5–5.1)
Sodium: 133 mmol/L — ABNORMAL LOW (ref 135–145)

## 2020-11-23 LAB — URINE CULTURE: Culture: >=100000 — AB

## 2020-11-23 LAB — CBC
HCT: 29.9 % — ABNORMAL LOW (ref 39.0–52.0)
Hemoglobin: 9.3 g/dL — ABNORMAL LOW (ref 13.0–17.0)
MCH: 28.6 pg (ref 26.0–34.0)
MCHC: 31.1 g/dL (ref 30.0–36.0)
MCV: 92 fL (ref 80.0–100.0)
Platelets: 191 10*3/uL (ref 150–400)
RBC: 3.25 MIL/uL — ABNORMAL LOW (ref 4.22–5.81)
RDW: 15.9 % — ABNORMAL HIGH (ref 11.5–15.5)
WBC: 8.7 10*3/uL (ref 4.0–10.5)
nRBC: 0 % (ref 0.0–0.2)

## 2020-11-23 LAB — MAGNESIUM: Magnesium: 1.8 mg/dL (ref 1.7–2.4)

## 2020-11-23 MED ORDER — FUROSEMIDE 10 MG/ML IJ SOLN
40.0000 mg | Freq: Once | INTRAMUSCULAR | Status: AC
  Administered 2020-11-23: 40 mg via INTRAVENOUS
  Filled 2020-11-23: qty 4

## 2020-11-23 MED ORDER — FUROSEMIDE 10 MG/ML IJ SOLN: 40.0000 mg | Freq: Once | INTRAMUSCULAR | Status: DC

## 2020-11-23 NOTE — Consult Note (Signed)
   Heart Failure Nurse Navigator Note   HfpEF 60 to 65%.  Wall motion abnormality.  Grade 1 diastolic dysfunction.  Right ventricular systolic function is severely reduced.  He initially presented on November 11, 2020 with complaints of shortness of breath and dark stool.  Chest x-ray revealed interstitial edema.  Was transferred back to the ICU after code STEMI was called for ST elevation in V2 and V3.  He underwent emergent catheterization.  Comorbidities:  Hypertension COPD Atrial fibrillation Hyperlipidemia CVA Coronary artery disease Iron deficiency anemia  Medications:  Apixaban 5 mg 2 times a day Aspirin 81 mg daily Atorvastatin 40 mg daily Isosorbide mononitrate 15 mg daily Midodrine 10 mg 3 times a day with meals Tamsulosin 0.4 mg daily  Labs:  Sodium 133, potassium 4.3, chloride 97, CO2 29, BUN 36, creatinine 1.23, magnesium 1.8 Weight is 90.9 kg Blood pressure 115/65 Intake 1190 mL Output 1601 mL   Initial meeting with patient today he was lying in bed in no acute distress.  States that sometimes he lives with granddaughter and ex-wife.  He states that most of his meals come from "mom's kitchen. " He states that he likes to put a small amount of salt on some of his food, explained the relationship between sodium intake and fluids.  Asked that he remove the saltshaker from the table.  Talked about the importance of daily weight and the routine of emptying the bladder and then weighing himself with same amount of cloths or in the nude daily.  And to report a 2 pound weight gain overnight of 5 pounds within the week.  Also discussed the importance of sticking with 64 ounce fluid restriction and how going over that defeats the purpose of taking the diuretic.  Discussed follow-up in the outpatient heart failure clinic, he has an appointment on November 28 at 10 AM.  He has a 4% history of no-show, 3 out of 73 appointments  Had no further questions.  Pricilla Riffle  RN [facility]

## 2020-11-23 NOTE — Progress Notes (Signed)
[facility] Encounter Note  Patient: [name] Browning / Admit Date: 11/11/2020 / Date of Encounter: 11/23/2020, 1:43 PM   Subjective: 11/11.  Improved since patient but still having multiple issues.  The patient has had known coronary artery disease with angina exacerbated by recent infection and some chronic hypoxia requiring oxygenation.  The patient has had episodes every morning of some anginal symptoms will and some bradycardia typically surrounding going to the bathroom.  It appears that he may be getting vagal as well with some bradycardia into the 20 bpm range but no evidence of syncope just dizziness.  11/12.  Patient has slept overnight with no evidence of chest discomfort and feelingWell this morning with no evidence of other significant cardiovascular symptoms.  Patient appears to have had reasonable blood pressure control with addition of isosorbide for chest comfort.  There has been no evidence of telemetry changes consistent with sick sinus syndrome at this time    11/13.  The patient has had improvements of his blood pressure heart rate and chest pain.  There is been no evidence of significant chest pain since addition of nitrates.  The patient though has significant lower extremity and scrotal edema of unknown etiology although may be secondary to continued multiple factors.  11/14.  Patient still has not had any chest pain over the last 48 hours even with ambulation.  Oxygenation continues to be relatively stable on supplemental oxygen.  The patient does have continued significant lower extremity edema for which she does not have any ulceration but is concerning.  Glomerular filtration rate is 60 with a hemoglobin of 9.3 and the urine output total of 770.  We have discussed the possibility of intravenous Lasix to help with treatment and the patient is agreeable to see if this improves is urine output  Cardiac catheterization showing minimal atherosclerosis of left  anterior descending artery and circumflex with occluded right coronary artery with collateralization which appears to be old and not amenable to PCI and/or stent placement.  Therefore medication management is appropriate  Patient has been relatively hypotensive and therefore cannot use antianginal medications to the fullest amount but will continue to work in the appropriate direction  Review of Systems: Positive for: Shortness of breath chest pain Negative for: Vision change, hearing change, syncope, dizziness, nausea, vomiting,diarrhea, bloody stool, stomach pain, cough, congestion, diaphoresis, urinary frequency, urinary pain,skin lesions, skin rashes Others previously listed  Objective: Telemetry: Atrial fibrillation with controlled ventricular rate Physical Exam: Blood pressure 115/65, pulse 69, temperature 98.7 F (37.1 C), resp. rate 17, height 6\' 2"  (1.88 m), weight 90.9 kg, SpO2 94 %. Body mass index is 25.72 kg/m. General: Well developed, well nourished, in no acute distress. Head: Normocephalic, atraumatic, sclera non-icteric, no xanthomas, nares are without discharge. Neck: No apparent masses Lungs: Normal respirations with no wheezes, no rhonchi, no rales , no crackles   Heart: irregular rate and rhythm, normal S1 S2, no murmur, no rub, no gallop, PMI is normal size and placement, carotid upstroke normal without bruit, jugular venous pressure normal Abdomen: Soft, non-tender, non-distended with normoactive bowel sounds. No hepatosplenomegaly. Abdominal aorta is normal size without bruit Extremities: 1+ edema, no clubbing, no cyanosis, no ulcers,  Peripheral: 2+ radial, 2+ femoral, 2+ dorsal pedal pulses Neuro: Alert and oriented. Moves all extremities spontaneously. Psych:  Responds to questions appropriately with a normal affect.   Intake/Output Summary (Last 24 hours) at 11/23/2020 1343 Last data filed at 11/23/2020 1100 Gross per 24 hour  Intake  580 ml  Output 1202 ml   Net -622 ml     Inpatient Medications:  . (feeding supplement) PROSource Plus  30 mL Oral TID BM  . sodium chloride   Intravenous Once  . apixaban  5 mg Oral BID  . aspirin EC  81 mg Oral Daily  . atorvastatin  40 mg Oral QPC supper  . Chlorhexidine Gluconate Cloth  6 each Topical Daily  . feeding supplement  1 Container Oral TID BM  . ferrous gluconate  324 mg Oral QPC supper  . furosemide  40 mg Intravenous Once  . guaiFENesin  15 mL Oral QID  . isosorbide mononitrate  15 mg Oral Daily  . midodrine  10 mg Oral TID WC  . mometasone-formoterol  2 puff Inhalation BID  . multivitamin with minerals  1 tablet Oral Daily  . pantoprazole  40 mg Oral BID  . sodium chloride flush  10-40 mL Intracatheter Q12H  . sodium chloride flush  3 mL Intravenous Q12H  . tamsulosin  0.4 mg Oral Daily   Infusions:  . sodium chloride    . promethazine (PHENERGAN) injection (IM or IVPB)      Labs: Recent Labs    11/22/20 0637 11/23/20 0344  NA 133* 133*  K 4.6 4.3  CL 98 97*  CO2 28 29  GLUCOSE 137* 109*  BUN 37* 36*  CREATININE 1.24 1.23  CALCIUM 8.6* 8.4*  MG 2.1 1.8    No results for input(s): AST, ALT, ALKPHOS, BILITOT, PROT, ALBUMIN in the last 72 hours. Recent Labs    11/22/20 0637 11/23/20 0344  WBC 9.3 8.7  HGB 9.2* 9.3*  HCT 29.3* 29.9*  MCV 90.7 92.0  PLT 148* 191    No results for input(s): CKTOTAL, CKMB, TROPONINI in the last 72 hours. Invalid input(s): POCBNP No results for input(s): HGBA1C in the last 72 hours.   Weights: Filed Weights   11/21/20 0208 11/22/20 0625 11/23/20 0204  Weight: 94.2 kg 94.1 kg 90.9 kg     Radiology/Studies:  DG Abd 1 View  Result Date: 11/18/2020 CLINICAL DATA:  Nausea and vomiting. EXAM: ABDOMEN - 1 VIEW COMPARISON:  10/20/2020. FINDINGS: Air is seen in the stomach. Gas in nondistended small bowel and colon. Gas in the rectum. Possible left renal stone. Coarsened basilar pulmonary markings. IMPRESSION: 1. No acute findings. 2.  Possible left renal stone. 3. Coarsened basilar pulmonary markings. Electronically Signed   By: Lorin Picket M.D.   On: 11/18/2020 10:35   CT HEAD WO CONTRAST (5MM)  Result Date: 11/05/2020 CLINICAL DATA:  Transient ischemic attack (TIA) EXAM: CT HEAD WITHOUT CONTRAST TECHNIQUE: Contiguous axial images were obtained from the base of the skull through the vertex without intravenous contrast. COMPARISON:  None. FINDINGS: Brain: There is no acute intracranial hemorrhage, mass effect, or edema. Gray-white differentiation is preserved. There is no extra-axial fluid collection. Ventricles and sulci are within normal limits in size and configuration. Vascular: There is atherosclerotic calcification at the skull base. Skull: Calvarium is unremarkable. Sinuses/Orbits: No acute finding. Other: None. IMPRESSION: No acute intracranial abnormality. Electronically Signed   By: Macy Mis M.D.   On: 11/05/2020 14:20   CT ABDOMEN PELVIS W CONTRAST  Result Date: 11/03/2020 CLINICAL DATA:  Nausea and vomiting EXAM: CT ABDOMEN AND PELVIS WITH CONTRAST TECHNIQUE: Multidetector CT imaging of the abdomen and pelvis was performed using the standard protocol following bolus administration of intravenous contrast. CONTRAST:  144mL OMNIPAQUE IOHEXOL 300 MG/ML  SOLN COMPARISON:  CT abdomen and pelvis dated October 20, 2020 FINDINGS: Lower chest: Cardiomegaly. Emphysema. Solid pulmonary nodules of the left lower lobe. Largest measures up to 7 mm and is located on series 2, image 98. Hepatobiliary: No focal liver abnormality is seen. No gallstones, gallbladder wall thickening, or biliary dilatation. Pancreas: Unremarkable. No pancreatic ductal dilatation or surrounding inflammatory changes. Spleen: Normal in size without focal abnormality. Adrenals/Urinary Tract: Bilateral adrenal glands are unremarkable. No hydronephrosis. Bilateral nonobstructing stones, left greater than right. Bladder is unremarkable. Stomach/Bowel: Stomach  is within normal limits. Appendix appears normal. Diverticulosis. No evidence of bowel wall thickening, distention, or inflammatory changes. Vascular/Lymphatic: Aortic atherosclerosis. No enlarged abdominal or pelvic lymph nodes. Reproductive: Prostatomegaly, measuring up to 5.8 cm. Other: Small right greater than left fat containing inguinal hernias. Musculoskeletal: Unchanged L4 compression deformity. No aggressive appearing osseous lesions. IMPRESSION: Diverticulosis with no evidence of diverticulitis. No acute findings abdomen or pelvis. Solid pulmonary nodules of the left lower lobe, largest measures up to 7 mm. Non-contrast chest CT at 6-12 months is recommended. If the nodule is stable at time of repeat CT, then future CT at 18-24 months (from today's scan) is considered optional for low-risk patients, but is recommended for high-risk patients. This recommendation follows the consensus statement: Guidelines for Management of Incidental Pulmonary Nodules Detected on CT Images: From the Fleischner Society 2017; Radiology 2017; 284:228-243. Aortic Atherosclerosis (ICD10-I70.0) and Emphysema (ICD10-J43.9). Electronically Signed   By: Yetta Glassman M.D.   On: 11/03/2020 15:48   CARDIAC CATHETERIZATION  Result Date: 11/14/2020 .  Ost RCA lesion is 95% stenosed. .  Prox RCA to Dist RCA lesion is 100% stenosed. -Very minimal flow.  After initial angiography, it disappeared to progressed to the ostium. .  Left-to-right collaterals via septal perforators fill the PDA system. .  Mid LM to Dist LM lesion is 20% stenosed. Colon Flattery LAD to Prox LAD lesion is 25% stenosed.  Relatively small caliber vessel that wraps the apex. .  Diminutive LCx with moderate sized OM1, mild disease. .  The left ventricular systolic function is normal. No obvious regional wall motion normalities. .  The left ventricular ejection fraction is 55-65% by visual estimate. .  LV end diastolic pressure is moderately elevated.  18 mmHg .  There  is no aortic valve stenosis. SUMMARY Cardiogenic shock-SCAI scale A-B => not favorable for mechanical support due to ongoing anemia, lack of active anginal symptoms and preserved EF with minimally elevated LVEDP. Severe single-vessel CAD with 95% ostial RCA and 1% flush occlusion of the proximal RCA- Unable to engage with guide catheter to wire the vessel. Does not correlate with anterior elevations and no wall motion normalities Minimal diffuse LAD disease with left-to-right collaterals filling the PDA and part of the PL system. Preserved LVEF of 50 to 55% with no obvious wall motion abnormalities. Successful Right IJ Triple-Lumen Catheter placed RECOMMENDATIONS Would not treat with antiplatelet agent given total occluded vessel and need for DOAC for A. fib.  Hold off on anticoagulation until hemodynamically stable Continue to titrate Levophed and dopamine for blood pressure support Transfuse to keep hemoglobin greater than 10 Consider possibility of RV involvement as potential cause of shock. Glenetta Hew, MD  US Venous Img Lower Bilateral (DVT)  Result Date: 11/11/2020 CLINICAL DATA:  Shortness of breath EXAM: Bilateral LOWER EXTREMITY VENOUS DOPPLER ULTRASOUND TECHNIQUE: Gray-scale sonography with compression, as well as color and duplex ultrasound, were performed to evaluate the deep venous system(s) from the level of the common femoral  vein through the popliteal and proximal calf veins. COMPARISON:  None. FINDINGS: VENOUS Normal compressibility of the common femoral, superficial femoral, and popliteal veins, as well as the visualized calf veins. Visualized portions of profunda femoral vein and great saphenous vein unremarkable. No filling defects to suggest DVT on grayscale or color Doppler imaging. Doppler waveforms show normal direction of venous flow, normal respiratory plasticity and response to augmentation. Limited views of the contralateral common femoral vein are unremarkable. OTHER None.  Limitations: none IMPRESSION: Negative. Electronically Signed   By: Merilyn Baba M.D.   On: 11/11/2020 18:07   DG Chest Port 1 View  Result Date: 11/15/2020 CLINICAL DATA:  Acute respiratory failure with hypoxia EXAM: PORTABLE CHEST 1 VIEW COMPARISON:  Chest radiograph from one day prior. FINDINGS: Right internal jugular central venous catheter terminates at the cavoatrial junction. Pacer pads overlie the left chest. Stable cardiomediastinal silhouette with mild cardiomegaly. No pneumothorax. No pleural effusion. Mild pulmonary edema, similar. Mild to moderate bibasilar atelectasis, worsened. IMPRESSION: 1. Stable mild congestive heart failure. 2. Mild to moderate bibasilar atelectasis, worsened. Electronically Signed   By: Ilona Sorrel M.D.   On: 11/15/2020 07:08   DG Chest Port 1 View  Result Date: 11/14/2020 CLINICAL DATA:  Central line placement. Hypoxia. Congestive heart failure. EXAM: PORTABLE CHEST 1 VIEW COMPARISON:  Prior today FINDINGS: A new right jugular central venous catheter is seen with tip overlying the proximal right atrium. No evidence of pneumothorax. Mild cardiomegaly stable. Diffuse interstitial infiltrates remains stable. Loculated fluid within the left major fissure is stable, and mild atelectasis or scarring is again seen in the right upper lobe. IMPRESSION: New right jugular central venous catheter tip overlies the proximal right atrium. No evidence of pneumothorax. Stable cardiomegaly and diffuse interstitial infiltrates/edema. Stable small loculated left pleural effusion, and right upper lobe atelectasis versus scarring. Electronically Signed   By: Marlaine Hind M.D.   On: 11/14/2020 14:11   DG Chest Port 1 View  Result Date: 11/14/2020 CLINICAL DATA:  Hypoxia EXAM: PORTABLE CHEST 1 VIEW COMPARISON:  11/11/2020 chest radiograph. FINDINGS: Stable cardiomediastinal silhouette with mild cardiomegaly. No pneumothorax. Focal opacity overlying the peripheral mid to upper left lung  appears more discrete. No right pleural effusion. Similar mild pulmonary edema. Stable small focal lung opacity in the peripheral upper right lung. IMPRESSION: 1. Similar mild congestive heart failure. 2. Focal opacity overlying the peripheral mid to upper left lung appears more discrete. Stable smaller focal peripheral upper right lung opacity. These may represent loculated pleural effusions and/or pulmonary nodules. Suggest further evaluation with noncontrast chest CT. Electronically Signed   By: Ilona Sorrel M.D.   On: 11/14/2020 07:26   DG Chest Portable 1 View  Result Date: 11/11/2020 CLINICAL DATA:  Shortness of breath EXAM: PORTABLE CHEST 1 VIEW COMPARISON:  Previous studies including the examination of 10/29/2020 FINDINGS: Transverse diameter of heart is slightly increased. Thoracic aorta is tortuous and ectatic. There is prominence of interstitial markings in the parahilar regions with interval worsening. There is faint haziness in the lateral aspect of left mid and both lower lung fields. There is a small smooth marginated radiopacity, possibly in the lateral aspect of minor fissure in the right mid lung fields. There is pleural thickening in both apices, more so on the left side. There is no pneumothorax. Lateral CP angles are clear. IMPRESSION: There is increase in interstitial markings in the parahilar regions suggesting interstitial edema or interstitial pneumonitis. Part of this finding is probably underlying scarring. There is  diffuse haziness in the lateral aspect of left mid and both lower lung fields which may be due to pleural thickening or loculated pleural effusions or pneumonia. Follow-up chest radiographs and CT as clinically warranted should be considered. Electronically Signed   By: Elmer Picker M.D.   On: 11/11/2020 13:38   DG Chest Port 1 View  Result Date: 10/29/2020 CLINICAL DATA:  Shortness of breath EXAM: PORTABLE CHEST 1 VIEW COMPARISON:  10/27/2020, 10/23/2020,  08/27/2017,, CT 09/19/2018 FINDINGS: Emphysematous disease. Mild reticular opacity at the bases likely due to fibrosis. Slight decreased interstitial opacity since 10/27/2020. No new confluent airspace disease, pleural effusion or pneumothorax. Apical pleural thickening. Stable cardiomediastinal silhouette with aortic atherosclerosis. IMPRESSION: 1. Underlying chronic lung disease/emphysema with overall decreased interstitial opacity since 10/27/2020 suggesting resolving superimposed edema or inflammatory process 2. No new confluent airspace disease Electronically Signed   By: Donavan Foil M.D.   On: 10/29/2020 15:39   DG Chest Port 1 View  Result Date: 10/27/2020 CLINICAL DATA:  Chronic hypoxemia, COPD presents with weakness and shortness of breath EXAM: PORTABLE CHEST 1 VIEW COMPARISON:  Chest radiograph 10/23/2020 FINDINGS: The cardiomediastinal silhouette is stable. There are diffusely coarsened interstitial markings throughout both lungs, overall not significantly changed since 10/23/2020. There is no new or worsening focal airspace disease. There is no pleural effusion. There is no pneumothorax. There is no acute osseous abnormality. IMPRESSION: Unchanged coarsened interstitial markings throughout both lungs since 10/23/2020 though these are increased since 10/06/2020. Findings may reflect edema or infection superimposed on chronic changes. Electronically Signed   By: Valetta Mole M.D.   On: 10/27/2020 09:05   ECHOCARDIOGRAM COMPLETE  Result Date: 11/15/2020    ECHOCARDIOGRAM REPORT   Patient Name:   [name] Browning Date of Exam: 11/15/2020 Medical Rec #:  [id number]     Height:       74.0 in Accession #:    [id number]    Weight:       190.5 lb Date of Birth:  03/24/1940     BSA:          2.129 m Patient Age:    4 years      BP:           105/60 mmHg Patient Gender: M             HR:           81 bpm. Exam Location:  [facility] Procedure: 2D Echo Indications:     Myocardial Infarct I21.9  History:          Patient has prior history of Echocardiogram examinations, most                  recent 10/23/2020.  Sonographer:     Kathlen Brunswick RDCS Referring Phys:  [place] Diagnosing Phys: [place]  1. Left ventricular ejection fraction, by estimation, is 60 to 65%. The left ventricle has normal function. The left ventricle demonstrates regional wall motion abnormalities (see scoring diagram/findings for description). Left ventricular diastolic parameters are consistent with Grade I diastolic dysfunction (impaired relaxation).  2. Right ventricular systolic function is severely reduced. The right ventricular size is severely enlarged.  3. Left atrial size was severely dilated.  4. Right atrial size was severely dilated.  5. The mitral valve is normal in structure. Mild mitral valve regurgitation. No evidence of mitral stenosis. Moderate mitral annular calcification.  6. The aortic valve is normal in structure. Aortic valve regurgitation is not  visualized. Mild aortic valve sclerosis is present, with no evidence of aortic valve stenosis.  7. The inferior vena cava is normal in size with greater than 50% respiratory variability, suggesting right atrial pressure of 3 mmHg. FINDINGS  Left Ventricle: Left ventricular ejection fraction, by estimation, is 60 to 65%. The left ventricle has normal function. The left ventricle demonstrates regional wall motion abnormalities. The left ventricular internal cavity size was normal in size. There is no left ventricular hypertrophy. Left ventricular diastolic parameters are consistent with Grade I diastolic dysfunction (impaired relaxation). Right Ventricle: The right ventricular size is severely enlarged. No increase in right ventricular wall thickness. Right ventricular systolic function is severely reduced. Left Atrium: Left atrial size was severely dilated. Right Atrium: Right atrial size was severely dilated. Pericardium: There is no evidence of  pericardial effusion. Mitral Valve: The mitral valve is normal in structure. Moderate mitral annular calcification. Mild mitral valve regurgitation. No evidence of mitral valve stenosis. Tricuspid Valve: The tricuspid valve is normal in structure. Tricuspid valve regurgitation is mild . No evidence of tricuspid stenosis. Aortic Valve: The aortic valve is normal in structure. Aortic valve regurgitation is not visualized. Mild aortic valve sclerosis is present, with no evidence of aortic valve stenosis. Aortic valve peak gradient measures 5.5 mmHg. Pulmonic Valve: The pulmonic valve was normal in structure. Pulmonic valve regurgitation is not visualized. No evidence of pulmonic stenosis. Aorta: The aortic root is normal in size and structure. Venous: The inferior vena cava is normal in size with greater than 50% respiratory variability, suggesting right atrial pressure of 3 mmHg. IAS/Shunts: No atrial level shunt detected by color flow Doppler.  LEFT VENTRICLE PLAX 2D LVIDd:         3.74 cm   Diastology LVIDs:         2.69 cm   LV e' medial:    5.77 cm/s LV PW:         1.26 cm   LV E/e' medial:  20.8 LV IVS:        1.18 cm   LV e' lateral:   7.83 cm/s LVOT diam:     1.80 cm   LV E/e' lateral: 15.3 LV SV:         29 LV SV Index:   14 LVOT Area:     2.54 cm  RIGHT VENTRICLE RV Basal diam:  4.32 cm RV S prime:     4.79 cm/s TAPSE (M-mode): 1.1 cm LEFT ATRIUM             Index        RIGHT ATRIUM           Index LA diam:        3.90 cm 1.83 cm/m   RA Area:     18.80 cm LA Vol (A2C):   42.4 ml 19.92 ml/m  RA Volume:   56.50 ml  26.54 ml/m LA Vol (A4C):   36.0 ml 16.91 ml/m LA Biplane Vol: 40.7 ml 19.12 ml/m  AORTIC VALVE                 PULMONIC VALVE AV Area (Vmax): 1.65 cm     PV Vmax:       0.94 m/s AV Vmax:        117.00 cm/s  PV Peak grad:  3.5 mmHg AV Peak Grad:   5.5 mmHg LVOT Vmax:      75.80 cm/s LVOT Vmean:     44.200 cm/s LVOT VTI:  0.113 m  AORTA Ao Root diam: 3.20 cm Ao Asc diam:  2.70 cm MITRAL  VALVE                TRICUSPID VALVE MV Area (PHT): 3.76 cm     TV Peak grad:   17.4 mmHg MV Decel Time: 202 msec     TV Vmax:        2.08 m/s MV E velocity: 120.00 cm/s                             SHUNTS                             Systemic VTI:  0.11 m                             Systemic Diam: 1.80 cm Neoma Laming Electronically signed by Neoma Laming Signature Date/Time: 11/15/2020/10:38:00 AM    Final      Assessment and Recommendation  80 y.o. male with known coronary artery disease with occluded right coronary artery with collateralization and not amenable to PCI and stent placement with some hypotension infection and non-ST elevation myocardial infarction having continued episodes of chest discomfort and bradycardia with chronic nonvalvular atrial fibrillation dating continued medication management 1.  Okay for discontinuation of midodrine with improved blood pressure and less chest pain.   2.  Avoid any beta-blocker or heart rate regulating medication management due to concerns of mild sick sinus syndrome not symptomatic at this time mainly in the setting of vagal maneuvers including urination and defecation 3.  Continuation of anticoagulation for further risk reduction and stroke with atrial fibrillation 4.  Continuation of supportive care for infection and hypoxia as per medicine 5.  Continue aspirin for further risk reduction and myocardial infarction and/or coronary artery disease with single antiplatelet therapy due to concerns of bleeding complications with concomitant use of anticoagulation 6.  High intensity cholesterol therapy 7.  No further cardiac diagnostics necessary at this time while continuing to treat above 8.   Intravenous Lasix for concerns of lower extremity edema and scrotal edema with continued hypoxia and diastolic dysfunction congestive heart failure watching closely for improvements and need for adjustments of medication management  Signed, Serafina Royals M.D. FACC

## 2020-11-23 NOTE — Progress Notes (Signed)
PROGRESS NOTE    [name] Browning  KGU:[id number] DOB: [birth year] DOA: 11/11/2020 PCP: [place]  [id number]   Assessment & Plan:   Principal Problem:   Acute on chronic diastolic CHF (congestive heart failure) (HCC) Active Problems:   HTN (hypertension)   COPD, moderate (HCC)   Acute on chronic respiratory failure with hypoxia (HCC)   Atrial fibrillation (HCC)   DVT (deep venous thrombosis) (HCC)   HLD (hyperlipidemia)   Stroke (HCC)   CAD (coronary artery disease)   NSTEMI (non-ST elevated myocardial infarction) (HCC)   Hypokalemia   Thrombocytopenia (HCC)   Iron deficiency anemia   GI bleeding   Acute ST elevation myocardial infarction (STEMI) of anterior wall (HCC)   Cardiogenic shock ([place])   Hemorrhagic shock [facility])   [name] Browning is a 80 year old male with history of CAD (nonobstructive calcified coronary disease), atrial fibrillation on Eliquis, HFpEF, hypertension, TIA in 2016, COPD on 2 L who was admitted to the hospital on 11/2 with shortness of breath.  He is admitted to the hospital 2 times in the last month.   First admission was from 10/20/2020 to 10/31/2020 when he was admitted with respiratory failure due to COPD and pneumonia, as well as an obstructive left renal stone.  He developed hypotension and worsening respiratory distress requiring pressors and BiPAP.     Blood cultures grew positive for Enterococcus faecalis and staph hemolyticus.  He was treated with antibiotics ending 11/03/2020.  The patient was recently admitted to the hospital from 10/25-10/28 with complaints of dysphagia.     He was seen by gastroenterology who underwent an EGD with no significant findings.  Since that time he has had progressive worsening shortness of breath, eventually presented to the emergency department where he was found to have an oxygen saturation of 88% on his home 2 L of oxygen.    Recurrent chest pain, likely anginal, resolved --brief episodes, responsive to SL  nitro.  started on Imdur 15 mg by cardiology.  No chest pain since Imdur started. --cont Imdur 15 mg daily  Bradycardia, intermittent --mild sick sinus syndrome not symptomatic at this time mainly in the setting of vagal maneuvers including urination and defecation. --avoid beta-blocker or other rate control agents  ACUTE on chronic hypoxic respiratory failure on 2L at baseline --2-4L requirement --Continue supplemental O2 to keep sats between 88-92%   Acute Anterior STEMI  Hx of Ischemic Cardiomyopathy - CODE STEMI CALLED 11/5 with ST elevations V2,V3.  cardiac catheterization showed no significant obstruction in the LAD with occluded RCA and collaterals from LAD to RCA.  It appeared that RCA was chronically occluded. No intervention. --s/p heparin gtt Plan: --cont ASA and statin --cont to hold beta blocker due to hypotension and intermittent bradycardia --cont Imdur 15 mg daily (new)  Acute on chronic diastolic CHF Acute on chronic right heart failure Echo 11/6: LVEF 60-65%, LV regional wall motion abnormalities, Grade 1 diastolic dysfunction, RV systolic function severely reduced with RV severe enlargement, LA + RA severely dilated --developing pitting edema and scrotal swelling --started IV diuresis on 11/13 Plan: --cont IV lasix 40 mg BID today --Strict I/O   CARDIOGENIC SHOCK, resolved Hypotension, improved --weaned off Levophed and dopamine as of morning of 11/9 --cont midodrine while diuresing   Anemia, acute on chronic Iron deficiency versus ? Acute blood loss secondary to GI bleed Presented with dark-colored stools and stool occult positive, noted drop in hgb which normalized s/p pRBC transfusion.  - GI following --s/p 2u pRBC  Plan: --no plan for endoscopic procedures at this time, per GI --cont oral PPI   ACUTE KIDNEY INJURY --dehydration, contrast.  Cr improved with gentle IVF hydration. --monitor Cr while diuresing  COPD --cont daily  bronchodilators  Dysuria --started on ceftriaxone today by night team.  Urine cx added on later, pos for E coli --received 3 days of ceftriaxone.   DVT prophylaxis: [id number] Code Status: Full code  Family Communication:  Level of care: stepdown Dispo:   The patient is from: home Anticipated d/c is to: home with [facility] (pt and family refused SNF rehab)  Anticipated d/c date is: 2-3 days Patient currently is not medically ready to d/c due to: IV lasix   Subjective and Interval History:  Pt reported making good amount of urine.  Eating "like a hog" now.  No chest pain.     Objective: Vitals:   11/23/20 0204 11/23/20 0412 11/23/20 0737 11/23/20 1529  BP:  109/60 115/65 106/60  Pulse:  70 69 87  Resp:  18 17 20   Temp:  (!) 97.4 F (36.3 C) 98.7 F (37.1 C) 98.7 F (37.1 C)  TempSrc:      SpO2:  95% 94% 94%  Weight: 90.9 kg     Height:        Intake/Output Summary (Last 24 hours) at 11/23/2020 1609 Last data filed at 11/23/2020 1500 Gross per 24 hour  Intake 580 ml  Output 1403 ml  Net -823 ml   Filed Weights   11/21/20 0208 11/22/20 0625 11/23/20 0204  Weight: 94.2 kg 94.1 kg 90.9 kg    Examination:   Constitutional: NAD, sleeping but easily awoken, AAOx3 HEENT: conjunctivae and lids normal, EOMI CV: No cyanosis.   RESP: normal respiratory effort, on 2L Extremities: 2+ pitting edema in BLE SKIN: warm, dry Neuro: II - XII grossly intact.   Psych: Normal mood and affect.  Appropriate judgement and reason   Data Reviewed: I have personally reviewed following labs and imaging studies  CBC: Recent Labs  Lab 11/19/20 0503 11/20/20 0432 11/21/20 0423 11/22/20 0637 11/23/20 0344  WBC 8.9 12.1* 11.8* 9.3 8.7  HGB 9.9* 9.5* 9.4* 9.2* 9.3*  HCT 30.2* 30.4* 30.1* 29.3* 29.9*  MCV 91.0 91.8 93.8 90.7 92.0  PLT 117* 114* 120* 148* 259   Basic Metabolic Panel: Recent Labs  Lab 11/17/20 0514 11/18/20 0506 11/19/20 0503 11/20/20 0432 11/21/20 0423  11/22/20 0637 11/23/20 0344  NA 132*   < > 131* 131* 132* 133* 133*  K 3.8   < > 4.3 5.4* 5.1 4.6 4.3  CL 96*   < > 95* 95* 97* 98 97*  CO2 28   < > 28 29 27 28 29   GLUCOSE 114*   < > 117* 133* 185* 137* 109*  BUN 44*   < > 50* 51* 41* 37* 36*  CREATININE 1.67*   < > 1.88* 1.58* 1.48* 1.24 1.23  CALCIUM 8.2*   < > 8.5* 8.5* 8.6* 8.6* 8.4*  MG  --   --  2.6* 2.2 2.3 2.1 1.8  PHOS 4.1  --   --   --   --   --   --    < > = values in this interval not displayed.   GFR: Estimated Creatinine Clearance: 56.6 mL/min (by C-G formula based on SCr of 1.23 mg/dL). Liver Function Tests: Recent Labs  Lab 11/17/20 0514  ALBUMIN 2.8*   No results for input(s): LIPASE, AMYLASE in the last 168 hours. No  results for input(s): AMMONIA in the last 168 hours. Coagulation Profile: No results for input(s): INR, PROTIME in the last 168 hours. Cardiac Enzymes: No results for input(s): CKTOTAL, CKMB, CKMBINDEX, TROPONINI in the last 168 hours. BNP (last 3 results) No results for input(s): PROBNP in the last 8760 hours. HbA1C: No results for input(s): HGBA1C in the last 72 hours. CBG: Recent Labs  Lab 11/20/20 0608  GLUCAP 111*   Lipid Profile: No results for input(s): CHOL, HDL, LDLCALC, TRIG, CHOLHDL, LDLDIRECT in the last 72 hours. Thyroid Function Tests: No results for input(s): TSH, T4TOTAL, FREET4, T3FREE, THYROIDAB in the last 72 hours. Anemia Panel: No results for input(s): VITAMINB12, FOLATE, FERRITIN, TIBC, IRON, RETICCTPCT in the last 72 hours. Sepsis Labs: No results for input(s): PROCALCITON, LATICACIDVEN in the last 168 hours.   Recent Results (from the past 240 hour(s))  CULTURE, BLOOD (ROUTINE X 2) w Reflex to ID Panel     Status: None (Preliminary result)   Collection Time: 11/20/20  8:44 AM   Specimen: BLOOD  Result Value Ref Range Status   Specimen Description BLOOD BLOOD RIGHT WRIST  Final   Special Requests   Final    BOTTLES DRAWN AEROBIC AND ANAEROBIC Blood Culture  adequate volume   Culture   Final    NO GROWTH 3 DAYS Performed at [facility], [address], [place], [place] [address]    Report Status PENDING  Incomplete  CULTURE, BLOOD (ROUTINE X 2) w Reflex to ID Panel     Status: None (Preliminary result)   Collection Time: 11/20/20  8:44 AM   Specimen: BLOOD  Result Value Ref Range Status   Specimen Description BLOOD BLOOD LEFT HAND  Final   Special Requests   Final    BOTTLES DRAWN AEROBIC AND ANAEROBIC Blood Culture adequate volume   Culture   Final    NO GROWTH 3 DAYS Performed at [facility], [address], [place], [place] [address]    Report Status PENDING  Incomplete  Urine Culture     Status: Abnormal   Collection Time: 11/20/20  2:09 PM   Specimen: Urine, Random  Result Value Ref Range Status   Specimen Description   Final    URINE, RANDOM Performed at [facility], [address] [place], [place] [address]    Special Requests   Final    NONE Performed at [facility], [address] [place], [place] [address]    Culture (A)  Final    >=100,000 COLONIES/mL ESCHERICHIA COLI Confirmed Extended Spectrum Beta-Lactamase Producer (ESBL).  In bloodstream infections from ESBL organisms, carbapenems are preferred over piperacillin/tazobactam. They are shown to have a lower risk of mortality.    Report Status 11/23/2020 FINAL  Final   Organism ID, Bacteria ESCHERICHIA COLI (A)  Final      Susceptibility   Escherichia coli - MIC*    AMPICILLIN >=32 RESISTANT Resistant     CEFAZOLIN >=64 RESISTANT Resistant     CEFEPIME 16 RESISTANT Resistant     CEFTRIAXONE >=64 RESISTANT Resistant     CIPROFLOXACIN >=4 RESISTANT Resistant     GENTAMICIN <=1 SENSITIVE Sensitive     IMIPENEM <=0.25 SENSITIVE Sensitive     NITROFURANTOIN <=16 SENSITIVE Sensitive     TRIMETH/SULFA >=320 RESISTANT Resistant     AMPICILLIN/SULBACTAM 4 SENSITIVE Sensitive     PIP/TAZO <=4 SENSITIVE Sensitive     *  >=100,000 COLONIES/mL ESCHERICHIA COLI      Radiology Studies: No  results found.   Scheduled Meds:  (feeding supplement) PROSource Plus  30 mL Oral TID BM   sodium chloride   Intravenous Once   apixaban  5 mg Oral BID   aspirin EC  81 mg Oral Daily   atorvastatin  40 mg Oral QPC supper   Chlorhexidine Gluconate Cloth  6 each Topical Daily   feeding supplement  1 Container Oral TID BM   ferrous gluconate  324 mg Oral QPC supper   furosemide  40 mg Intravenous Once   guaiFENesin  15 mL Oral QID   isosorbide mononitrate  15 mg Oral Daily   mometasone-formoterol  2 puff Inhalation BID   multivitamin with minerals  1 tablet Oral Daily   pantoprazole  40 mg Oral BID   sodium chloride flush  10-40 mL Intracatheter Q12H   sodium chloride flush  3 mL Intravenous Q12H   tamsulosin  0.4 mg Oral Daily   Continuous Infusions:  sodium chloride     promethazine (PHENERGAN) injection (IM or IVPB)       LOS: 12 days     Enzo Bi, MD [facility] If 7PM-7AM, please contact night-coverage 11/23/2020, 4:09 PM

## 2020-11-24 ENCOUNTER — Ambulatory Visit: Payer: Medicare HMO | Admitting: Family

## 2020-11-24 DIAGNOSIS — I5033 Acute on chronic diastolic (congestive) heart failure: Secondary | ICD-10-CM | POA: Diagnosis not present

## 2020-11-24 LAB — BASIC METABOLIC PANEL
Anion gap: 7 (ref 5–15)
BUN: 30 mg/dL — ABNORMAL HIGH (ref 8–23)
CO2: 32 mmol/L (ref 22–32)
Calcium: 8.3 mg/dL — ABNORMAL LOW (ref 8.9–10.3)
Chloride: 96 mmol/L — ABNORMAL LOW (ref 98–111)
Creatinine, Ser: 1.2 mg/dL (ref 0.61–1.24)
GFR, Estimated: >60 mL/min (ref >60)
Glucose, Bld: 117 mg/dL — ABNORMAL HIGH (ref 70–99)
Potassium: 3.6 mmol/L (ref 3.5–5.1)
Sodium: 135 mmol/L (ref 135–145)

## 2020-11-24 LAB — CBC
HCT: 27.4 % — ABNORMAL LOW (ref 39.0–52.0)
Hemoglobin: 8.4 g/dL — ABNORMAL LOW (ref 13.0–17.0)
MCH: 28 pg (ref 26.0–34.0)
MCHC: 30.7 g/dL (ref 30.0–36.0)
MCV: 91.3 fL (ref 80.0–100.0)
Platelets: 215 10*3/uL (ref 150–400)
RBC: 3 MIL/uL — ABNORMAL LOW (ref 4.22–5.81)
RDW: 15.9 % — ABNORMAL HIGH (ref 11.5–15.5)
WBC: 9.2 10*3/uL (ref 4.0–10.5)
nRBC: 0 % (ref 0.0–0.2)

## 2020-11-24 LAB — MAGNESIUM: Magnesium: 1.6 mg/dL — ABNORMAL LOW (ref 1.7–2.4)

## 2020-11-24 MED ORDER — FUROSEMIDE 10 MG/ML IJ SOLN
40.0000 mg | Freq: Four times a day (QID) | INTRAMUSCULAR | Status: AC
  Administered 2020-11-24 (×2): 40 mg via INTRAVENOUS
  Filled 2020-11-24 (×2): qty 4

## 2020-11-24 NOTE — Progress Notes (Signed)
  Heart Failure Nurse Navigator Note  Met with patient he was sitting up in the chair at bedside, had just finished breakfast.  Discussed with him the importance of standing for weights in place of bed weights.  He voices understanding.  Also secure chatted with Dr Billie Ruddy, questioning if patient would not benefit from Teds hose to mobilize the fluid in his lower legs.  She recommended ACE wraps.  Spoke with nurse, Meryl Crutch.  Also re enforced low sodium diet and and removing alt shaker from the table.  Patient had no other questions or concerns.  Pricilla Riffle RN [facility]

## 2020-11-24 NOTE — Progress Notes (Signed)
[facility] Cardiology [facility] Encounter Note  Patient: [name] Browning / Admit Date: 11/11/2020 / Date of Encounter: 11/24/2020, 8:22 AM   Subjective: 11/11.  Improved since patient but still having multiple issues.  The patient has had known coronary artery disease with angina exacerbated by recent infection and some chronic hypoxia requiring oxygenation.  The patient has had episodes every morning of some anginal symptoms will and some bradycardia typically surrounding going to the bathroom.  It appears that he may be getting vagal as well with some bradycardia into the 20 bpm range but no evidence of syncope just dizziness.  11/12.  Patient has slept overnight with no evidence of chest discomfort and feelingWell this morning with no evidence of other significant cardiovascular symptoms.  Patient appears to have had reasonable blood pressure control with addition of isosorbide for chest comfort.  There has been no evidence of telemetry changes consistent with sick sinus syndrome at this time    11/13.  The patient has had improvements of his blood pressure heart rate and chest pain.  There is been no evidence of significant chest pain since addition of nitrates.  The patient though has significant lower extremity and scrotal edema of unknown etiology although may be secondary to continued multiple factors.  11/14.  Patient still has not had any chest pain over the last 48 hours even with ambulation.  Oxygenation continues to be relatively stable on supplemental oxygen.  The patient does have continued significant lower extremity edema for which she does not have any ulceration but is concerning.  Glomerular filtration rate is 60 with a hemoglobin of 9.3 and the urine output total of 770.  We have discussed the possibility of intravenous Lasix to help with treatment and the patient is agreeable to see if this improves is urine output  11/15.  Patient has some nonspecific episodes of abdominal  discomfort and just does not feel well with continued lower extremity edema and some shortness of breath.  No evidence of chest pain overnight and blood pressure is stable now discontinuation of midodrine.  The patient has had reasonable urine output overnight with extra liter and diuresis.  Cardiac catheterization showing minimal atherosclerosis of left anterior descending artery and circumflex with occluded right coronary artery with collateralization which appears to be old and not amenable to PCI and/or stent placement.  Therefore medication management is appropriate  Patient has been relatively hypotensive and therefore cannot use antianginal medications to the fullest amount but will continue to work in the appropriate direction  Review of Systems: Positive for: Shortness of breath chest pain Negative for: Vision change, hearing change, syncope, dizziness, nausea, vomiting,diarrhea, bloody stool, stomach pain, cough, congestion, diaphoresis, urinary frequency, urinary pain,skin lesions, skin rashes Others previously listed  Objective: Telemetry: Atrial fibrillation with controlled ventricular rate Physical Exam: Blood pressure (!) 102/57, pulse 71, temperature 98.7 F (37.1 C), resp. rate 17, height 6\' 2"  (1.88 m), weight 91.9 kg, SpO2 95 %. Body mass index is 26.01 kg/m. General: Well developed, well nourished, in no acute distress. Head: Normocephalic, atraumatic, sclera non-icteric, no xanthomas, nares are without discharge. Neck: No apparent masses Lungs: Normal respirations with no wheezes, no rhonchi, no rales , no crackles   Heart: irregular rate and rhythm, normal S1 S2, no murmur, no rub, no gallop, PMI is normal size and placement, carotid upstroke normal without bruit, jugular venous pressure normal Abdomen: Soft, non-tender, non-distended with normoactive bowel sounds. No hepatosplenomegaly. Abdominal aorta is normal size without bruit Extremities:  1+ edema, no clubbing, no  cyanosis, no ulcers,  Peripheral: 2+ radial, 2+ femoral, 2+ dorsal pedal pulses Neuro: Alert and oriented. Moves all extremities spontaneously. Psych:  Responds to questions appropriately with a normal affect.   Intake/Output Summary (Last 24 hours) at 11/24/2020 7253 Last data filed at 11/23/2020 2000 Gross per 24 hour  Intake 1200 ml  Output 2103 ml  Net -903 ml     Inpatient Medications:  . (feeding supplement) PROSource Plus  30 mL Oral TID BM  . sodium chloride   Intravenous Once  . apixaban  5 mg Oral BID  . aspirin EC  81 mg Oral Daily  . atorvastatin  40 mg Oral QPC supper  . Chlorhexidine Gluconate Cloth  6 each Topical Daily  . feeding supplement  1 Container Oral TID BM  . ferrous gluconate  324 mg Oral QPC supper  . guaiFENesin  15 mL Oral QID  . isosorbide mononitrate  15 mg Oral Daily  . mometasone-formoterol  2 puff Inhalation BID  . multivitamin with minerals  1 tablet Oral Daily  . pantoprazole  40 mg Oral BID  . sodium chloride flush  10-40 mL Intracatheter Q12H  . sodium chloride flush  3 mL Intravenous Q12H  . tamsulosin  0.4 mg Oral Daily   Infusions:  . sodium chloride    . promethazine (PHENERGAN) injection (IM or IVPB)      Labs: Recent Labs    11/23/20 0344 11/24/20 0458  NA 133* 135  K 4.3 3.6  CL 97* 96*  CO2 29 32  GLUCOSE 109* 117*  BUN 36* 30*  CREATININE 1.23 1.20  CALCIUM 8.4* 8.3*  MG 1.8 1.6*    No results for input(s): AST, ALT, ALKPHOS, BILITOT, PROT, ALBUMIN in the last 72 hours. Recent Labs    11/23/20 0344 11/24/20 0458  WBC 8.7 9.2  HGB 9.3* 8.4*  HCT 29.9* 27.4*  MCV 92.0 91.3  PLT 191 215    No results for input(s): CKTOTAL, CKMB, TROPONINI in the last 72 hours. Invalid input(s): POCBNP No results for input(s): HGBA1C in the last 72 hours.   Weights: Filed Weights   11/22/20 0625 11/23/20 0204 11/24/20 0440  Weight: 94.1 kg 90.9 kg 91.9 kg     Radiology/Studies:  DG Abd 1 View  Result Date:  11/18/2020 CLINICAL DATA:  Nausea and vomiting. EXAM: ABDOMEN - 1 VIEW COMPARISON:  10/20/2020. FINDINGS: Air is seen in the stomach. Gas in nondistended small bowel and colon. Gas in the rectum. Possible left renal stone. Coarsened basilar pulmonary markings. IMPRESSION: 1. No acute findings. 2. Possible left renal stone. 3. Coarsened basilar pulmonary markings. Electronically Signed   By: Lorin Picket M.D.   On: 11/18/2020 10:35   CT HEAD WO CONTRAST (5MM)  Result Date: 11/05/2020 CLINICAL DATA:  Transient ischemic attack (TIA) EXAM: CT HEAD WITHOUT CONTRAST TECHNIQUE: Contiguous axial images were obtained from the base of the skull through the vertex without intravenous contrast. COMPARISON:  None. FINDINGS: Brain: There is no acute intracranial hemorrhage, mass effect, or edema. Gray-white differentiation is preserved. There is no extra-axial fluid collection. Ventricles and sulci are within normal limits in size and configuration. Vascular: There is atherosclerotic calcification at the skull base. Skull: Calvarium is unremarkable. Sinuses/Orbits: No acute finding. Other: None. IMPRESSION: No acute intracranial abnormality. Electronically Signed   By: Macy Mis M.D.   On: 11/05/2020 14:20   CT ABDOMEN PELVIS W CONTRAST  Result Date: 11/03/2020 CLINICAL DATA:  Nausea  and vomiting EXAM: CT ABDOMEN AND PELVIS WITH CONTRAST TECHNIQUE: Multidetector CT imaging of the abdomen and pelvis was performed using the standard protocol following bolus administration of intravenous contrast. CONTRAST:  18mL OMNIPAQUE IOHEXOL 300 MG/ML  SOLN COMPARISON:  CT abdomen and pelvis dated October 20, 2020 FINDINGS: Lower chest: Cardiomegaly. Emphysema. Solid pulmonary nodules of the left lower lobe. Largest measures up to 7 mm and is located on series 2, image 98. Hepatobiliary: No focal liver abnormality is seen. No gallstones, gallbladder wall thickening, or biliary dilatation. Pancreas: Unremarkable. No  pancreatic ductal dilatation or surrounding inflammatory changes. Spleen: Normal in size without focal abnormality. Adrenals/Urinary Tract: Bilateral adrenal glands are unremarkable. No hydronephrosis. Bilateral nonobstructing stones, left greater than right. Bladder is unremarkable. Stomach/Bowel: Stomach is within normal limits. Appendix appears normal. Diverticulosis. No evidence of bowel wall thickening, distention, or inflammatory changes. Vascular/Lymphatic: Aortic atherosclerosis. No enlarged abdominal or pelvic lymph nodes. Reproductive: Prostatomegaly, measuring up to 5.8 cm. Other: Small right greater than left fat containing inguinal hernias. Musculoskeletal: Unchanged L4 compression deformity. No aggressive appearing osseous lesions. IMPRESSION: Diverticulosis with no evidence of diverticulitis. No acute findings abdomen or pelvis. Solid pulmonary nodules of the left lower lobe, largest measures up to 7 mm. Non-contrast chest CT at 6-12 months is recommended. If the nodule is stable at time of repeat CT, then future CT at 18-24 months (from today's scan) is considered optional for low-risk patients, but is recommended for high-risk patients. This recommendation follows the consensus statement: Guidelines for Management of Incidental Pulmonary Nodules Detected on CT Images: From the Fleischner Society 2017; Radiology 2017; 284:228-243. Aortic Atherosclerosis (ICD10-I70.0) and Emphysema (ICD10-J43.9). Electronically Signed   By: Yetta Glassman M.D.   On: 11/03/2020 15:48   CARDIAC CATHETERIZATION  Result Date: 11/14/2020 .  Ost RCA lesion is 95% stenosed. .  Prox RCA to Dist RCA lesion is 100% stenosed. -Very minimal flow.  After initial angiography, it disappeared to progressed to the ostium. .  Left-to-right collaterals via septal perforators fill the PDA system. .  Mid LM to Dist LM lesion is 20% stenosed. Colon Flattery LAD to Prox LAD lesion is 25% stenosed.  Relatively small caliber vessel that wraps  the apex. .  Diminutive LCx with moderate sized OM1, mild disease. .  The left ventricular systolic function is normal. No obvious regional wall motion normalities. .  The left ventricular ejection fraction is 55-65% by visual estimate. .  LV end diastolic pressure is moderately elevated.  18 mmHg .  There is no aortic valve stenosis. SUMMARY Cardiogenic shock-SCAI scale A-B => not favorable for mechanical support due to ongoing anemia, lack of active anginal symptoms and preserved EF with minimally elevated LVEDP. Severe single-vessel CAD with 95% ostial RCA and 1% flush occlusion of the proximal RCA- Unable to engage with guide catheter to wire the vessel. Does not correlate with anterior elevations and no wall motion normalities Minimal diffuse LAD disease with left-to-right collaterals filling the PDA and part of the PL system. Preserved LVEF of 50 to 55% with no obvious wall motion abnormalities. Successful Right IJ Triple-Lumen Catheter placed RECOMMENDATIONS Would not treat with antiplatelet agent given total occluded vessel and need for DOAC for A. fib.  Hold off on anticoagulation until hemodynamically stable Continue to titrate Levophed and dopamine for blood pressure support Transfuse to keep hemoglobin greater than 10 Consider possibility of RV involvement as potential cause of shock. Glenetta Hew, MD  US Venous Img Lower Bilateral (DVT)  Result Date:  11/11/2020 CLINICAL DATA:  Shortness of breath EXAM: Bilateral LOWER EXTREMITY VENOUS DOPPLER ULTRASOUND TECHNIQUE: Gray-scale sonography with compression, as well as color and duplex ultrasound, were performed to evaluate the deep venous system(s) from the level of the common femoral vein through the popliteal and proximal calf veins. COMPARISON:  None. FINDINGS: VENOUS Normal compressibility of the common femoral, superficial femoral, and popliteal veins, as well as the visualized calf veins. Visualized portions of profunda femoral vein and great  saphenous vein unremarkable. No filling defects to suggest DVT on grayscale or color Doppler imaging. Doppler waveforms show normal direction of venous flow, normal respiratory plasticity and response to augmentation. Limited views of the contralateral common femoral vein are unremarkable. OTHER None. Limitations: none IMPRESSION: Negative. Electronically Signed   By: Merilyn Baba M.D.   On: 11/11/2020 18:07   DG Chest Port 1 View  Result Date: 11/15/2020 CLINICAL DATA:  Acute respiratory failure with hypoxia EXAM: PORTABLE CHEST 1 VIEW COMPARISON:  Chest radiograph from one day prior. FINDINGS: Right internal jugular central venous catheter terminates at the cavoatrial junction. Pacer pads overlie the left chest. Stable cardiomediastinal silhouette with mild cardiomegaly. No pneumothorax. No pleural effusion. Mild pulmonary edema, similar. Mild to moderate bibasilar atelectasis, worsened. IMPRESSION: 1. Stable mild congestive heart failure. 2. Mild to moderate bibasilar atelectasis, worsened. Electronically Signed   By: Ilona Sorrel M.D.   On: 11/15/2020 07:08   DG Chest Port 1 View  Result Date: 11/14/2020 CLINICAL DATA:  Central line placement. Hypoxia. Congestive heart failure. EXAM: PORTABLE CHEST 1 VIEW COMPARISON:  Prior today FINDINGS: A new right jugular central venous catheter is seen with tip overlying the proximal right atrium. No evidence of pneumothorax. Mild cardiomegaly stable. Diffuse interstitial infiltrates remains stable. Loculated fluid within the left major fissure is stable, and mild atelectasis or scarring is again seen in the right upper lobe. IMPRESSION: New right jugular central venous catheter tip overlies the proximal right atrium. No evidence of pneumothorax. Stable cardiomegaly and diffuse interstitial infiltrates/edema. Stable small loculated left pleural effusion, and right upper lobe atelectasis versus scarring. Electronically Signed   By: Marlaine Hind M.D.   On: 11/14/2020  14:11   DG Chest Port 1 View  Result Date: 11/14/2020 CLINICAL DATA:  Hypoxia EXAM: PORTABLE CHEST 1 VIEW COMPARISON:  11/11/2020 chest radiograph. FINDINGS: Stable cardiomediastinal silhouette with mild cardiomegaly. No pneumothorax. Focal opacity overlying the peripheral mid to upper left lung appears more discrete. No right pleural effusion. Similar mild pulmonary edema. Stable small focal lung opacity in the peripheral upper right lung. IMPRESSION: 1. Similar mild congestive heart failure. 2. Focal opacity overlying the peripheral mid to upper left lung appears more discrete. Stable smaller focal peripheral upper right lung opacity. These may represent loculated pleural effusions and/or pulmonary nodules. Suggest further evaluation with noncontrast chest CT. Electronically Signed   By: Ilona Sorrel M.D.   On: 11/14/2020 07:26   DG Chest Portable 1 View  Result Date: 11/11/2020 CLINICAL DATA:  Shortness of breath EXAM: PORTABLE CHEST 1 VIEW COMPARISON:  Previous studies including the examination of 10/29/2020 FINDINGS: Transverse diameter of heart is slightly increased. Thoracic aorta is tortuous and ectatic. There is prominence of interstitial markings in the parahilar regions with interval worsening. There is faint haziness in the lateral aspect of left mid and both lower lung fields. There is a small smooth marginated radiopacity, possibly in the lateral aspect of minor fissure in the right mid lung fields. There is pleural thickening in both apices, more  so on the left side. There is no pneumothorax. Lateral CP angles are clear. IMPRESSION: There is increase in interstitial markings in the parahilar regions suggesting interstitial edema or interstitial pneumonitis. Part of this finding is probably underlying scarring. There is diffuse haziness in the lateral aspect of left mid and both lower lung fields which may be due to pleural thickening or loculated pleural effusions or pneumonia. Follow-up chest  radiographs and CT as clinically warranted should be considered. Electronically Signed   By: Elmer Picker M.D.   On: 11/11/2020 13:38   DG Chest Port 1 View  Result Date: 10/29/2020 CLINICAL DATA:  Shortness of breath EXAM: PORTABLE CHEST 1 VIEW COMPARISON:  10/27/2020, 10/23/2020, 08/27/2017,, CT 09/19/2018 FINDINGS: Emphysematous disease. Mild reticular opacity at the bases likely due to fibrosis. Slight decreased interstitial opacity since 10/27/2020. No new confluent airspace disease, pleural effusion or pneumothorax. Apical pleural thickening. Stable cardiomediastinal silhouette with aortic atherosclerosis. IMPRESSION: 1. Underlying chronic lung disease/emphysema with overall decreased interstitial opacity since 10/27/2020 suggesting resolving superimposed edema or inflammatory process 2. No new confluent airspace disease Electronically Signed   By: Donavan Foil M.D.   On: 10/29/2020 15:39   DG Chest Port 1 View  Result Date: 10/27/2020 CLINICAL DATA:  Chronic hypoxemia, COPD presents with weakness and shortness of breath EXAM: PORTABLE CHEST 1 VIEW COMPARISON:  Chest radiograph 10/23/2020 FINDINGS: The cardiomediastinal silhouette is stable. There are diffusely coarsened interstitial markings throughout both lungs, overall not significantly changed since 10/23/2020. There is no new or worsening focal airspace disease. There is no pleural effusion. There is no pneumothorax. There is no acute osseous abnormality. IMPRESSION: Unchanged coarsened interstitial markings throughout both lungs since 10/23/2020 though these are increased since 10/06/2020. Findings may reflect edema or infection superimposed on chronic changes. Electronically Signed   By: Valetta Mole M.D.   On: 10/27/2020 09:05   ECHOCARDIOGRAM COMPLETE  Result Date: 11/15/2020    ECHOCARDIOGRAM REPORT   Patient Name:   KENRICK PORE Date of Exam: 11/15/2020 Medical Rec #:  [id number]     Height:       74.0 in Accession #:     [id number]    Weight:       190.5 lb Date of Birth:  1940-02-06     BSA:          2.129 m Patient Age:    79 years      BP:           105/60 mmHg Patient Gender: M             HR:           81 bpm. Exam Location:  [facility] Procedure: 2D Echo Indications:     Myocardial Infarct I21.9  History:         Patient has prior history of Echocardiogram examinations, most                  recent 10/23/2020.  Sonographer:     Kathlen Brunswick RDCS Referring Phys:  [place] Diagnosing Phys: [place]  1. Left ventricular ejection fraction, by estimation, is 60 to 65%. The left ventricle has normal function. The left ventricle demonstrates regional wall motion abnormalities (see scoring diagram/findings for description). Left ventricular diastolic parameters are consistent with Grade I diastolic dysfunction (impaired relaxation).  2. Right ventricular systolic function is severely reduced. The right ventricular size is severely enlarged.  3. Left atrial size was severely dilated.  4. Right  atrial size was severely dilated.  5. The mitral valve is normal in structure. Mild mitral valve regurgitation. No evidence of mitral stenosis. Moderate mitral annular calcification.  6. The aortic valve is normal in structure. Aortic valve regurgitation is not visualized. Mild aortic valve sclerosis is present, with no evidence of aortic valve stenosis.  7. The inferior vena cava is normal in size with greater than 50% respiratory variability, suggesting right atrial pressure of 3 mmHg. FINDINGS  Left Ventricle: Left ventricular ejection fraction, by estimation, is 60 to 65%. The left ventricle has normal function. The left ventricle demonstrates regional wall motion abnormalities. The left ventricular internal cavity size was normal in size. There is no left ventricular hypertrophy. Left ventricular diastolic parameters are consistent with Grade I diastolic dysfunction (impaired relaxation). Right Ventricle: The right  ventricular size is severely enlarged. No increase in right ventricular wall thickness. Right ventricular systolic function is severely reduced. Left Atrium: Left atrial size was severely dilated. Right Atrium: Right atrial size was severely dilated. Pericardium: There is no evidence of pericardial effusion. Mitral Valve: The mitral valve is normal in structure. Moderate mitral annular calcification. Mild mitral valve regurgitation. No evidence of mitral valve stenosis. Tricuspid Valve: The tricuspid valve is normal in structure. Tricuspid valve regurgitation is mild . No evidence of tricuspid stenosis. Aortic Valve: The aortic valve is normal in structure. Aortic valve regurgitation is not visualized. Mild aortic valve sclerosis is present, with no evidence of aortic valve stenosis. Aortic valve peak gradient measures 5.5 mmHg. Pulmonic Valve: The pulmonic valve was normal in structure. Pulmonic valve regurgitation is not visualized. No evidence of pulmonic stenosis. Aorta: The aortic root is normal in size and structure. Venous: The inferior vena cava is normal in size with greater than 50% respiratory variability, suggesting right atrial pressure of 3 mmHg. IAS/Shunts: No atrial level shunt detected by color flow Doppler.  LEFT VENTRICLE PLAX 2D LVIDd:         3.74 cm   Diastology LVIDs:         2.69 cm   LV e' medial:    5.77 cm/s LV PW:         1.26 cm   LV E/e' medial:  20.8 LV IVS:        1.18 cm   LV e' lateral:   7.83 cm/s LVOT diam:     1.80 cm   LV E/e' lateral: 15.3 LV SV:         29 LV SV Index:   14 LVOT Area:     2.54 cm  RIGHT VENTRICLE RV Basal diam:  4.32 cm RV S prime:     4.79 cm/s TAPSE (M-mode): 1.1 cm LEFT ATRIUM             Index        RIGHT ATRIUM           Index LA diam:        3.90 cm 1.83 cm/m   RA Area:     18.80 cm LA Vol (A2C):   42.4 ml 19.92 ml/m  RA Volume:   56.50 ml  26.54 ml/m LA Vol (A4C):   36.0 ml 16.91 ml/m LA Biplane Vol: 40.7 ml 19.12 ml/m  AORTIC VALVE                  PULMONIC VALVE AV Area (Vmax): 1.65 cm     PV Vmax:       0.94 m/s AV Vmax:  117.00 cm/s  PV Peak grad:  3.5 mmHg AV Peak Grad:   5.5 mmHg LVOT Vmax:      75.80 cm/s LVOT Vmean:     44.200 cm/s LVOT VTI:       0.113 m  AORTA Ao Root diam: 3.20 cm Ao Asc diam:  2.70 cm MITRAL VALVE                TRICUSPID VALVE MV Area (PHT): 3.76 cm     TV Peak grad:   17.4 mmHg MV Decel Time: 202 msec     TV Vmax:        2.08 m/s MV E velocity: 120.00 cm/s                             SHUNTS                             Systemic VTI:  0.11 m                             Systemic Diam: 1.80 cm Neoma Laming Electronically signed by Neoma Laming Signature Date/Time: 11/15/2020/10:38:00 AM    Final      Assessment and Recommendation  80 y.o. male with known coronary artery disease with occluded right coronary artery with collateralization and not amenable to PCI and stent placement with some hypotension infection and non-ST elevation myocardial infarction having continued episodes of chest discomfort and bradycardia with chronic nonvalvular atrial fibrillation dating continued medication management 1.  Continue to watch closely for hypotension and no additional medication management today 2.  Avoid any beta-blocker or heart rate regulating medication management due to concerns of mild sick sinus syndrome not symptomatic at this time mainly in the setting of vagal maneuvers including urination and defecation 3.  Continuation of anticoagulation for further risk reduction and stroke with atrial fibrillation 4.  Continuation of supportive care for infection and hypoxia as per medicine 5.  Continue aspirin for further risk reduction and myocardial infarction and/or coronary artery disease with single antiplatelet therapy due to concerns of bleeding complications with concomitant use of anticoagulation 6.  High intensity cholesterol therapy 7.  No further cardiac diagnostics necessary at this time while continuing to treat  above 8.   I May continue intravenous Lasix which has significantly helped his diuresis although he continues to have significant lower extremity edema unchanged from before.  Continuation of ambulation to follow for need for adjustments of medication management  Signed, Serafina Royals M.D. FACC

## 2020-11-24 NOTE — Progress Notes (Signed)
Nutrition Follow-up  DOCUMENTATION CODES:   Not applicable  INTERVENTION:   -D/c 30 ml Prosource Plus -D/c Boost Breeze -Continue MVI with minerals daily  NUTRITION DIAGNOSIS:   Inadequate oral intake related to nausea, decreased appetite as evidenced by per patient/family report.  Ongoing  GOAL:   Patient will meet greater than or equal to 90% of their needs  Progressing   MONITOR:   PO intake, Supplement acceptance, Labs, Weight trends, Skin, I & O's  REASON FOR ASSESSMENT:   Rounds    ASSESSMENT:   [name] Browning is a 80 y.o. male with medical history significant of dCHF,  HLD, HLD, DVT and A fib on Eliquis, COPD on 2 L oxygen, stroke, TIA, GERD, anxiety, CAD, carotid artery stenosis, kidney stone, syncope, iron deficiency anemia, who presents with shortness of breath.  Reviewed I/O's: -903 ml x 24 hours and -1.7 L since admission  UOP: 2.1 L x 24 hours  Pt's intake has improved greatly since admission. Noted PO's 50-100%. Pt is refusing oral nutrition supplements. Will d/c due to improved intake.   Labs reviewed.   Diet Order:   Diet Order             DIET DYS 2 Room service appropriate? Yes; Fluid consistency: Thin  Diet effective now                   EDUCATION NEEDS:   Education needs have been addressed  Skin:  Skin Assessment: Skin Integrity Issues: Skin Integrity Issues:: Incisions Incisions: closed perineum  Last BM:  11/22/20  Height:   Ht Readings from Last 1 Encounters:  11/21/20 6\' 2"  (1.88 m)    Weight:   Wt Readings from Last 1 Encounters:  11/24/20 91.9 kg    Ideal Body Weight:  86.4 kg  BMI:  Body mass index is 26.01 kg/m.  Estimated Nutritional Needs:   Kcal:  2150-2350  Protein:  110-125 grams  Fluid:  > 2 L    Loistine Chance, RD, LDN, [place] Registered Dietitian II Certified Diabetes Care and Education Specialist Please refer to [facility] for RD and/or RD on-call/weekend/after hours pager

## 2020-11-24 NOTE — Progress Notes (Signed)
PROGRESS NOTE    [name] Browning  TMH:[id number] DOB: 06-25-1940 DOA: 11/11/2020 PCP: [place]  [id number]/[id number]-AA   Assessment & Plan:   Principal Problem:   Acute on chronic diastolic CHF (congestive heart failure) (HCC) Active Problems:   HTN (hypertension)   COPD, moderate (HCC)   Acute on chronic respiratory failure with hypoxia (HCC)   Atrial fibrillation (HCC)   DVT (deep venous thrombosis) (HCC)   HLD (hyperlipidemia)   Stroke (HCC)   CAD (coronary artery disease)   NSTEMI (non-ST elevated myocardial infarction) (HCC)   Hypokalemia   Thrombocytopenia (HCC)   Iron deficiency anemia   GI bleeding   Acute ST elevation myocardial infarction (STEMI) of anterior wall (HCC)   Cardiogenic shock ([place])   Hemorrhagic shock [facility])   [name] Browning is a 80 year old male with history of CAD (nonobstructive calcified coronary disease), atrial fibrillation on Eliquis, HFpEF, hypertension, TIA in 2016, COPD on 2 L who was admitted to the hospital on 11/2 with shortness of breath.  He is admitted to the hospital 2 times in the last month.   First admission was from 10/20/2020 to 10/31/2020 when he was admitted with respiratory failure due to COPD and pneumonia, as well as an obstructive left renal stone.  He developed hypotension and worsening respiratory distress requiring pressors and BiPAP.     Blood cultures grew positive for Enterococcus faecalis and staph hemolyticus.  He was treated with antibiotics ending 11/03/2020.  The patient was recently admitted to the hospital from 10/25-10/28 with complaints of dysphagia.     He was seen by gastroenterology who underwent an EGD with no significant findings.  Since that time he has had progressive worsening shortness of breath, eventually presented to the emergency department where he was found to have an oxygen saturation of 88% on his home 2 L of oxygen.    Recurrent chest pain, likely anginal, resolved --brief episodes, responsive to SL  nitro.  started on Imdur 15 mg by cardiology.  No chest pain since Imdur started. --cont Imdur 15 mg daily  Bradycardia, intermittent --mild sick sinus syndrome not symptomatic at this time mainly in the setting of vagal maneuvers including urination and defecation. --avoid beta-blocker, per cardiology  ACUTE on chronic hypoxic respiratory failure on 2L at baseline --back down to baseline 2L --Continue supplemental O2 to keep sats between 88-92%   Acute Anterior STEMI  Hx of Ischemic Cardiomyopathy - CODE STEMI CALLED 11/5 with ST elevations V2,V3.  cardiac catheterization showed no significant obstruction in the LAD with occluded RCA and collaterals from LAD to RCA.  It appeared that RCA was chronically occluded. No intervention. --s/p heparin gtt Plan: --cont ASA and statin --cont to hold beta blocker due to hypotension and intermittent bradycardia --cont Imdur 15 mg daily (new)  Acute on chronic diastolic CHF Acute on chronic right heart failure Echo 11/6: LVEF 60-65%, LV regional wall motion abnormalities, Grade 1 diastolic dysfunction, RV systolic function severely reduced with RV severe enlargement, LA + RA severely dilated --developing 2-3+ pitting edema and scrotal swelling. --started IV diuresis BID on 11/13 Plan: --cont IV lasix 40 mg BID today --Strict I/O --ACE wrap BLE   CARDIOGENIC SHOCK, resolved Hypotension, improved --weaned off Levophed and dopamine as of morning of 11/9 --cont midodrine while diuresing   Anemia, acute on chronic Iron deficiency versus ? Acute blood loss secondary to GI bleed Presented with dark-colored stools and stool occult positive, noted drop in hgb which normalized s/p pRBC transfusion.  -  GI following --s/p 2u pRBC Plan: --no plan for endoscopic procedures at this time, per GI --cont oral PPI   ACUTE KIDNEY INJURY --dehydration, contrast.  Cr improved with gentle IVF hydration. --monitor Cr while diuresing  COPD --cont daily  bronchodilators  Dysuria --started on ceftriaxone today by night team.  Urine cx added on later, pos for E coli --received 3 days of ceftriaxone.   DVT prophylaxis: DX:AJOINOM Code Status: Full code  Family Communication:  Level of care: stepdown Dispo:   The patient is from: home Anticipated d/c is to: home with [facility] (pt and family refused SNF rehab)  Anticipated d/c date is: 2-3 days Patient currently is not medically ready to d/c due to: IV lasix   Subjective and Interval History:  Pt reported feeling better.  Worked with PT today.  Diuresing well, however, leg swelling appeared about the same.  RN asked to wrap the legs with ACE wrap.   Objective: Vitals:   11/24/20 0138 11/24/20 0440 11/24/20 0738 11/24/20 1151  BP: (!) 119/54 (!) 107/55 (!) 102/57 (!) 102/55  Pulse: 73 74 71 71  Resp:  18 17 20   Temp: 98.7 F (37.1 C) 98.1 F (36.7 C) 98.7 F (37.1 C) 98.6 F (37 C)  TempSrc: Oral Oral    SpO2: (!) 84% 93% 95%   Weight:  91.9 kg    Height:        Intake/Output Summary (Last 24 hours) at 11/24/2020 1646 Last data filed at 11/24/2020 1100 Gross per 24 hour  Intake 960 ml  Output 2001 ml  Net -1041 ml   Filed Weights   11/22/20 0625 11/23/20 0204 11/24/20 0440  Weight: 94.1 kg 90.9 kg 91.9 kg    Examination:   Constitutional: NAD, AAOx3 HEENT: conjunctivae and lids normal, EOMI CV: No cyanosis.   RESP: normal respiratory effort, on 2L Extremities: 2+ pitting edema in BLE SKIN: warm, dry Neuro: II - XII grossly intact.   Psych: Normal mood and affect.  Appropriate judgement and reason   Data Reviewed: I have personally reviewed following labs and imaging studies  CBC: Recent Labs  Lab 11/20/20 0432 11/21/20 0423 11/22/20 0637 11/23/20 0344 11/24/20 0458  WBC 12.1* 11.8* 9.3 8.7 9.2  HGB 9.5* 9.4* 9.2* 9.3* 8.4*  HCT 30.4* 30.1* 29.3* 29.9* 27.4*  MCV 91.8 93.8 90.7 92.0 91.3  PLT 114* 120* 148* 191 767   Basic Metabolic Panel: Recent  Labs  Lab 11/20/20 0432 11/21/20 0423 11/22/20 0637 11/23/20 0344 11/24/20 0458  NA 131* 132* 133* 133* 135  K 5.4* 5.1 4.6 4.3 3.6  CL 95* 97* 98 97* 96*  CO2 29 27 28 29  32  GLUCOSE 133* 185* 137* 109* 117*  BUN 51* 41* 37* 36* 30*  CREATININE 1.58* 1.48* 1.24 1.23 1.20  CALCIUM 8.5* 8.6* 8.6* 8.4* 8.3*  MG 2.2 2.3 2.1 1.8 1.6*   GFR: Estimated Creatinine Clearance: 58 mL/min (by C-G formula based on SCr of 1.2 mg/dL). Liver Function Tests: No results for input(s): AST, ALT, ALKPHOS, BILITOT, PROT, ALBUMIN in the last 168 hours.  No results for input(s): LIPASE, AMYLASE in the last 168 hours. No results for input(s): AMMONIA in the last 168 hours. Coagulation Profile: No results for input(s): INR, PROTIME in the last 168 hours. Cardiac Enzymes: No results for input(s): CKTOTAL, CKMB, CKMBINDEX, TROPONINI in the last 168 hours. BNP (last 3 results) No results for input(s): PROBNP in the last 8760 hours. HbA1C: No results for input(s): HGBA1C in the  last 72 hours. CBG: Recent Labs  Lab 11/20/20 0608  GLUCAP 111*   Lipid Profile: No results for input(s): CHOL, HDL, LDLCALC, TRIG, CHOLHDL, LDLDIRECT in the last 72 hours. Thyroid Function Tests: No results for input(s): TSH, T4TOTAL, FREET4, T3FREE, THYROIDAB in the last 72 hours. Anemia Panel: No results for input(s): VITAMINB12, FOLATE, FERRITIN, TIBC, IRON, RETICCTPCT in the last 72 hours. Sepsis Labs: No results for input(s): PROCALCITON, LATICACIDVEN in the last 168 hours.   Recent Results (from the past 240 hour(s))  CULTURE, BLOOD (ROUTINE X 2) w Reflex to ID Panel     Status: None (Preliminary result)   Collection Time: 11/20/20  8:44 AM   Specimen: BLOOD  Result Value Ref Range Status   Specimen Description BLOOD BLOOD RIGHT WRIST  Final   Special Requests   Final    BOTTLES DRAWN AEROBIC AND ANAEROBIC Blood Culture adequate volume   Culture   Final    NO GROWTH 4 DAYS Performed at [facility], [address], [place], [place] [address]    Report Status PENDING  Incomplete  CULTURE, BLOOD (ROUTINE X 2) w Reflex to ID Panel     Status: None (Preliminary result)   Collection Time: 11/20/20  8:44 AM   Specimen: BLOOD  Result Value Ref Range Status   Specimen Description BLOOD BLOOD LEFT HAND  Final   Special Requests   Final    BOTTLES DRAWN AEROBIC AND ANAEROBIC Blood Culture adequate volume   Culture   Final    NO GROWTH 4 DAYS Performed at [facility], [address], [place], [place] [address]    Report Status PENDING  Incomplete  Urine Culture     Status: Abnormal   Collection Time: 11/20/20  2:09 PM   Specimen: Urine, Random  Result Value Ref Range Status   Specimen Description   Final    URINE, RANDOM Performed at [facility], [address], [place], [place] [address]    Special Requests   Final    NONE Performed at [facility], [address] [place], [place] [address]    Culture (A)  Final    >=100,000 COLONIES/mL ESCHERICHIA COLI Confirmed Extended Spectrum Beta-Lactamase Producer (ESBL).  In bloodstream infections from ESBL organisms, carbapenems are preferred over piperacillin/tazobactam. They are shown to have a lower risk of mortality.    Report Status 11/23/2020 FINAL  Final   Organism ID, Bacteria ESCHERICHIA COLI (A)  Final      Susceptibility   Escherichia coli - MIC*    AMPICILLIN >=32 RESISTANT Resistant     CEFAZOLIN >=64 RESISTANT Resistant     CEFEPIME 16 RESISTANT Resistant     CEFTRIAXONE >=64 RESISTANT Resistant     CIPROFLOXACIN >=4 RESISTANT Resistant     GENTAMICIN <=1 SENSITIVE Sensitive     IMIPENEM <=0.25 SENSITIVE Sensitive     NITROFURANTOIN <=16 SENSITIVE Sensitive     TRIMETH/SULFA >=320 RESISTANT Resistant     AMPICILLIN/SULBACTAM 4 SENSITIVE Sensitive     PIP/TAZO <=4 SENSITIVE Sensitive     * >=100,000 COLONIES/mL ESCHERICHIA COLI      Radiology Studies: No results  found.   Scheduled Meds:  (feeding supplement) PROSource Plus  30 mL Oral TID BM   sodium chloride   Intravenous Once   apixaban  5 mg Oral BID   aspirin EC  81 mg Oral Daily   atorvastatin  40 mg Oral QPC supper   Chlorhexidine Gluconate Cloth  6 each  Topical Daily   feeding supplement  1 Container Oral TID BM   ferrous gluconate  324 mg Oral QPC supper   furosemide  40 mg Intravenous Q6H   guaiFENesin  15 mL Oral QID   isosorbide mononitrate  15 mg Oral Daily   mometasone-formoterol  2 puff Inhalation BID   multivitamin with minerals  1 tablet Oral Daily   pantoprazole  40 mg Oral BID   sodium chloride flush  10-40 mL Intracatheter Q12H   sodium chloride flush  3 mL Intravenous Q12H   tamsulosin  0.4 mg Oral Daily   Continuous Infusions:  sodium chloride     promethazine (PHENERGAN) injection (IM or IVPB)       LOS: 13 days     Enzo Bi, MD [facility] If 7PM-7AM, please contact night-coverage 11/24/2020, 4:46 PM

## 2020-11-24 NOTE — Progress Notes (Signed)
Physical Therapy Treatment Patient Details Name: [name] Browning MRN: [id number] DOB: November 13, 1940 Today's Date: 11/24/2020   History of Present Illness [name] Browning is a 80 y.o. male with a past medical history of anxiety, CAD, COPD on 2 L of oxygen, prior CVA, CHF, hypertension, hyperlipidemia, presents to the emergency department for shortness of breath.  According to the patient he was recently discharged from the hospital 10/28 for shortness of breath due to CHF exacerbation. ICU stay since 11/05 for treatment of hypotension, hypoxemia, STEMI,  possible GI bleed.    PT Comments    Pt in recliner at entry, O2 donned, agreeable to session. Pt able to perform transfers and AMB without physical assistance. Pt AMB in room with assist due to excessive obstacles and lines/leads, no LOB, maintains sats, but legs tolerates less than household distance AMB, although his tolerance is improving. Per MD, pt continues to refuse STR/SNF at DC. Plan is for return to GDTR's home at DC. Pt hypotensive, but not orthostatic. Will continue to follow.   Recommendations for follow up therapy are one component of a multi-disciplinary discharge planning process, led by the attending physician.  Recommendations may be updated based on patient status, additional functional criteria and insurance authorization.  Follow Up Recommendations  Home health PT (pt refusing STR at this time.)     Assistance Recommended at Discharge Frequent or constant Supervision/Assistance  Equipment Recommendations  Rolling walker (2 wheels)    Recommendations for Other Services       Precautions / Restrictions Precautions Precautions: Fall     Mobility  Bed Mobility                    Transfers Overall transfer level: Needs assistance Equipment used: Rolling walker (2 wheels) Transfers: Sit to/from Stand Sit to Stand: Supervision                Ambulation/Gait Ambulation/Gait assistance: Min guard Gait  Distance (Feet): 72 Feet Assistive device: Rolling walker (2 wheels)   Gait velocity: increased     General Gait Details: legs fatigue quickly pt unsure if he'll make it back to recliner.   Stairs             Wheelchair Mobility    Modified Rankin (Stroke Patients Only)       Balance                                            Cognition                                                Exercises Other Exercises Other Exercises: 5xSTS from chair    General Comments        Pertinent Vitals/Pain      Home Living                          Prior Function            PT Goals (current goals can now be found in the care plan section) Acute Rehab PT Goals Patient Stated Goal: to go home PT Goal Formulation: With patient/family Time For Goal Achievement: 12/03/20 Potential to Achieve Goals: Fair Progress towards PT goals:  Progressing toward goals    Frequency    Min 2X/week      PT Plan Discharge plan needs to be updated;Equipment recommendations need to be updated    Co-evaluation              AM-PAC PT "6 Clicks" Mobility   Outcome Measure  Help needed turning from your back to your side while in a flat bed without using bedrails?: A Little Help needed moving from lying on your back to sitting on the side of a flat bed without using bedrails?: A Lot Help needed moving to and from a bed to a chair (including a wheelchair)?: A Little Help needed standing up from a chair using your arms (e.g., wheelchair or bedside chair)?: A Little Help needed to walk in hospital room?: A Little Help needed climbing 3-5 steps with a railing? : A Little 6 Click Score: 17    End of Session Equipment Utilized During Treatment: Oxygen;Gait belt Activity Tolerance: Patient limited by fatigue;Patient tolerated treatment well;No increased pain Patient left: in chair;with call bell/phone within reach   PT Visit  Diagnosis: Unsteadiness on feet (R26.81);Other abnormalities of gait and mobility (R26.89);Muscle weakness (generalized) (M62.81);Difficulty in walking, not elsewhere classified (R26.2)     Time: 5396-7289 PT Time Calculation (min) (ACUTE ONLY): 18 min  Charges:  $Therapeutic Exercise: 8-22 mins                    4:55 PM, 11/24/20 [name], PT, DPT Physical Therapist - [facility]  [phone] ([place])     Gwyndolyn Guilford C 11/24/2020, 4:53 PM

## 2020-11-25 DIAGNOSIS — I5033 Acute on chronic diastolic (congestive) heart failure: Secondary | ICD-10-CM | POA: Diagnosis not present

## 2020-11-25 LAB — CULTURE, BLOOD (ROUTINE X 2)
Culture: NO GROWTH
Culture: NO GROWTH
Special Requests: ADEQUATE
Special Requests: ADEQUATE

## 2020-11-25 LAB — CBC
HCT: 27.5 % — ABNORMAL LOW (ref 39.0–52.0)
Hemoglobin: 8.6 g/dL — ABNORMAL LOW (ref 13.0–17.0)
MCH: 28.9 pg (ref 26.0–34.0)
MCHC: 31.3 g/dL (ref 30.0–36.0)
MCV: 92.3 fL (ref 80.0–100.0)
Platelets: 234 10*3/uL (ref 150–400)
RBC: 2.98 MIL/uL — ABNORMAL LOW (ref 4.22–5.81)
RDW: 15.9 % — ABNORMAL HIGH (ref 11.5–15.5)
WBC: 9.4 10*3/uL (ref 4.0–10.5)
nRBC: 0 % (ref 0.0–0.2)

## 2020-11-25 LAB — BASIC METABOLIC PANEL
Anion gap: 6 (ref 5–15)
BUN: 29 mg/dL — ABNORMAL HIGH (ref 8–23)
CO2: 34 mmol/L — ABNORMAL HIGH (ref 22–32)
Calcium: 8.4 mg/dL — ABNORMAL LOW (ref 8.9–10.3)
Chloride: 94 mmol/L — ABNORMAL LOW (ref 98–111)
Creatinine, Ser: 1.25 mg/dL — ABNORMAL HIGH (ref 0.61–1.24)
GFR, Estimated: 59 mL/min — ABNORMAL LOW (ref >60)
Glucose, Bld: 119 mg/dL — ABNORMAL HIGH (ref 70–99)
Potassium: 3.6 mmol/L (ref 3.5–5.1)
Sodium: 134 mmol/L — ABNORMAL LOW (ref 135–145)

## 2020-11-25 LAB — MAGNESIUM: Magnesium: 1.5 mg/dL — ABNORMAL LOW (ref 1.7–2.4)

## 2020-11-25 MED ORDER — SENNOSIDES-DOCUSATE SODIUM 8.6-50 MG PO TABS
1.0000 | ORAL_TABLET | Freq: Two times a day (BID) | ORAL | Status: DC
  Administered 2020-11-25 – 2020-12-01 (×13): 1 via ORAL
  Filled 2020-11-25 (×14): qty 1

## 2020-11-25 MED ORDER — POLYETHYLENE GLYCOL 3350 17 G PO PACK
17.0000 g | PACK | Freq: Every day | ORAL | Status: DC
  Administered 2020-11-25 – 2020-11-29 (×5): 17 g via ORAL
  Filled 2020-11-25 (×7): qty 1

## 2020-11-25 MED ORDER — POTASSIUM CHLORIDE CRYS ER 20 MEQ PO TBCR
40.0000 meq | EXTENDED_RELEASE_TABLET | Freq: Once | ORAL | Status: AC
  Administered 2020-11-25: 40 meq via ORAL
  Filled 2020-11-25: qty 2

## 2020-11-25 MED ORDER — MAGNESIUM SULFATE 2 GM/50ML IV SOLN
2.0000 g | Freq: Once | INTRAVENOUS | Status: AC
  Administered 2020-11-25: 2 g via INTRAVENOUS
  Filled 2020-11-25: qty 50

## 2020-11-25 MED ORDER — LACTULOSE 10 GM/15ML PO SOLN: 20.0000 g | Freq: Two times a day (BID) | ORAL | Status: DC | PRN

## 2020-11-25 NOTE — Telephone Encounter (Signed)
We will need to update pt's medications once he has been released.  This triage message has been open for over a month & pt has been hospitalized since 11/11/20

## 2020-11-25 NOTE — Progress Notes (Addendum)
PROGRESS NOTE    [name] Browning   HEN:[id number]  DOB: [birth year]  PCP: [place]    DOA: 11/11/2020 LOS: 71    Brief Narrative / Hospital Course to Date:   [name] Browning is a 80 year old male with history of CAD (nonobstructive calcified coronary disease), atrial fibrillation on Eliquis, HFpEF, hypertension, TIA in 2016, COPD on 2 L who was admitted to the hospital on 11/2 with shortness of breath.   He has been admitted to the hospital 2 times in the last month.   First admission from 10/20/2020 to 10/31/2020 for respiratory failure due to COPD and pneumonia, as well as an obstructive left renal stone.  He developed hypotension and worsening respiratory distress requiring pressors and BiPAP.  Blood cultures grew Enterococcus faecalis and Staph hemolyticus, treated with antibiotics ending 11/03/2020.  Then he was readmitted from 10/25-10/28 with complaints of dysphagia.  He was seen by gastroenterology, underwent an EGD with no significant findings.  Since that time, he has had progressive worsening shortness of breath, eventually presented to the emergency department where he was found to have an oxygen saturation of 88% on his home 2 L of oxygen.   Assessment & Plan   Principal Problem:   Acute on chronic diastolic CHF (congestive heart failure) (HCC) Active Problems:   HTN (hypertension)   COPD, moderate (HCC)   Acute on chronic respiratory failure with hypoxia (HCC)   Atrial fibrillation (HCC)   DVT (deep venous thrombosis) (HCC)   HLD (hyperlipidemia)   Stroke (HCC)   CAD (coronary artery disease)   NSTEMI (non-ST elevated myocardial infarction) (HCC)   Hypokalemia   Thrombocytopenia (HCC)   Iron deficiency anemia   GI bleeding   Acute ST elevation myocardial infarction (STEMI) of anterior wall (HCC)   Cardiogenic shock (HCC)   Hemorrhagic shock (HCC)   Recurrent chest pain, likely anginal - resolved Brief episodes, responsive to SL nitro.   Started on Imdur 15 mg  by cardiology.   No chest pain since Imdur started. --Continue Imdur 15 mg daily   Bradycardia, intermittent - Due to mild sick sinus syndrome. Not symptomatic at this time. Episodes occur mainly in the setting of vagal maneuvers including urination and defecation. --avoid beta-blocker, per cardiology   Acute on chronic hypoxic respiratory failure - on 2 L/min at baseline.  Now stable, back on baseline oxygen. --Supplemental O2 to keep sats between 88-92%   Acute Anterior STEMI  Hx of Ischemic Cardiomyopathy CODE STEMI was called on 11/5 with ST elevations V2,V3.   Cardiac catheterization showed no significant obstruction in the LAD with occluded RCA and collaterals from LAD to RCA.  It appeared that RCA was chronically occluded. No intervention. Treated with heparin gtt, now off. --Continue ASA and statin --Hold beta blocker due to hypotension and intermittent bradycardia --Continue Imdur 15 mg daily (new)   Acute on chronic diastolic CHF Acute on chronic right heart failure Echo 11/6: LVEF 60-65%, LV regional wall motion abnormalities, Grade 1 diastolic dysfunction, RV systolic function severely reduced with RV severe enlargement, LA + RA severely dilated Pt developed 2-3+ pitting edema of BLE's and scrotal swelling. Started IV diuresis on 11/13 --Last given Lasix 40 mg BID 11/15 --Strict I/O & daily weights --ACE wraps to BLE's --Cardiology following  Hypomagnesemia -replacing from Mg 1.5, 2 g IV mag sulfate ordered.   CARDIOGENIC SHOCK, resolved Hypotension, improved Weaned off Levophed and dopamine as of morning of 11/9 Was on midodrine while diuresing, now stopped per cardiology  Anemia, acute on chronic Iron deficiency versus ? Acute blood loss secondary to GI bleed Presented with dark-colored stools and stool occult positive, drop in hgb which improved after pRBC transfusion.  --GI consulted -- no plan for endoscopy at this time --s/p 2 units pRBC's --cont oral  PPI   ACUTE KIDNEY INJURY - multifactorial due to dehydration, IV contrast.   Renal function improved with gentle IVF hydration. --monitor renal function with diuresis   COPD - stable, no wheezing, on baseline 2 L/min O2 Continue bronchodilators   Dysuria - started on ceftriaxone by night team.  Urine cx grew E coli.   Completed 3 days of ceftriaxone.    Patient BMI: Body mass index is 25.9 kg/m.   DVT prophylaxis: Place and maintain sequential compression device Start: 11/11/20 1656 apixaban (ELIQUIS) tablet 5 mg   Diet:  Diet Orders (From admission, onward)     Start     Ordered   11/18/20 1203  DIET DYS 2 Room service appropriate? Yes; Fluid consistency: Thin  Diet effective now       Question Answer Comment  Room service appropriate? Yes   Fluid consistency: Thin      11/18/20 1203              Code Status: Full Code   Subjective 11/25/20    Patient sitting up in recliner eating lunch when seen today.  He reports overall feeling well.  Confirms he uses 2 L/min oxygen at home.  Denies chest pain, shortness of breath or any other acute complaints.  Says he is feeling better.   Disposition Plan & Communication   Status is: Inpatient  Remains inpatient appropriate because: May require further IV diuresis, closely monitoring volume status.  DC pending cardiology clearance.    Consults, Procedures, Significant Events   Consultants:  Cardiology  Procedures:  None  Antimicrobials:  Anti-infectives (From admission, onward)    Start     Dose/Rate Route Frequency Ordered Stop   11/21/20 1000  cefTRIAXone (ROCEPHIN) 1 g in sodium chloride 0.9 % 100 mL IVPB  Status:  Discontinued        1 g 200 mL/hr over 30 Minutes Intravenous Every 24 hours 11/20/20 1351 11/22/20 1908   11/20/20 1000  cefTRIAXone (ROCEPHIN) 1 g in sodium chloride 0.9 % 100 mL IVPB  Status:  Discontinued        1 g 200 mL/hr over 30 Minutes Intravenous Every 12 hours 11/20/20 0614 11/20/20  1351         Micro    Objective   Vitals:   11/25/20 0325 11/25/20 0732 11/25/20 0854 11/25/20 1106  BP: 100/61 (!) 96/58 105/71 (!) 95/58  Pulse:  80 71 81  Resp: 19 18  18   Temp: 97.9 F (36.6 C) 98 F (36.7 C)  98 F (36.7 C)  TempSrc: Oral     SpO2: 94% 93%  96%  Weight: 91.5 kg     Height:        Intake/Output Summary (Last 24 hours) at 11/25/2020 1326 Last data filed at 11/25/2020 1100 Gross per 24 hour  Intake 960 ml  Output 2150 ml  Net -1190 ml   Filed Weights   11/23/20 0204 11/24/20 0440 11/25/20 0325  Weight: 90.9 kg 91.9 kg 91.5 kg    Physical Exam:  General exam: Sitting up in recliner, awake, alert, no acute distress HEENT: moist mucus membranes, hearing grossly normal  Respiratory system: CTAB, no wheezes, rales or rhonchi, normal respiratory  effort, on 2 L/min nasal cannula oxygen. Cardiovascular system: normal S1/S2, RRR, Ace wraps on bilateral lower extremities with proximal pitting edema.   Gastrointestinal system: Distended, NT, +bowel sounds. Central nervous system: A&O x3. no gross focal neurologic deficits, normal speech Psychiatry: normal mood, congruent affect, judgement and insight appear normal  Labs   Data Reviewed: I have personally reviewed following labs and imaging studies  CBC: Recent Labs  Lab 11/21/20 0423 11/22/20 0637 11/23/20 0344 11/24/20 0458 11/25/20 0330  WBC 11.8* 9.3 8.7 9.2 9.4  HGB 9.4* 9.2* 9.3* 8.4* 8.6*  HCT 30.1* 29.3* 29.9* 27.4* 27.5*  MCV 93.8 90.7 92.0 91.3 92.3  PLT 120* 148* 191 215 409   Basic Metabolic Panel: Recent Labs  Lab 11/21/20 0423 11/22/20 0637 11/23/20 0344 11/24/20 0458 11/25/20 0330  NA 132* 133* 133* 135 134*  K 5.1 4.6 4.3 3.6 3.6  CL 97* 98 97* 96* 94*  CO2 27 28 29  32 34*  GLUCOSE 185* 137* 109* 117* 119*  BUN 41* 37* 36* 30* 29*  CREATININE 1.48* 1.24 1.23 1.20 1.25*  CALCIUM 8.6* 8.6* 8.4* 8.3* 8.4*  MG 2.3 2.1 1.8 1.6* 1.5*   GFR: Estimated Creatinine  Clearance: 55.7 mL/min (A) (by C-G formula based on SCr of 1.25 mg/dL (H)). Liver Function Tests: No results for input(s): AST, ALT, ALKPHOS, BILITOT, PROT, ALBUMIN in the last 168 hours. No results for input(s): LIPASE, AMYLASE in the last 168 hours. No results for input(s): AMMONIA in the last 168 hours. Coagulation Profile: No results for input(s): INR, PROTIME in the last 168 hours. Cardiac Enzymes: No results for input(s): CKTOTAL, CKMB, CKMBINDEX, TROPONINI in the last 168 hours. BNP (last 3 results) No results for input(s): PROBNP in the last 8760 hours. HbA1C: No results for input(s): HGBA1C in the last 72 hours. CBG: Recent Labs  Lab 11/20/20 0608  GLUCAP 111*   Lipid Profile: No results for input(s): CHOL, HDL, LDLCALC, TRIG, CHOLHDL, LDLDIRECT in the last 72 hours. Thyroid Function Tests: No results for input(s): TSH, T4TOTAL, FREET4, T3FREE, THYROIDAB in the last 72 hours. Anemia Panel: No results for input(s): VITAMINB12, FOLATE, FERRITIN, TIBC, IRON, RETICCTPCT in the last 72 hours. Sepsis Labs: No results for input(s): PROCALCITON, LATICACIDVEN in the last 168 hours.  Recent Results (from the past 240 hour(s))  CULTURE, BLOOD (ROUTINE X 2) w Reflex to ID Panel     Status: None   Collection Time: 11/20/20  8:44 AM   Specimen: BLOOD  Result Value Ref Range Status   Specimen Description BLOOD BLOOD RIGHT WRIST  Final   Special Requests   Final    BOTTLES DRAWN AEROBIC AND ANAEROBIC Blood Culture adequate volume   Culture   Final    NO GROWTH 5 DAYS Performed at [facility], [address] [place], [place] [address]    Report Status 11/25/2020 FINAL  Final  CULTURE, BLOOD (ROUTINE X 2) w Reflex to ID Panel     Status: None   Collection Time: 11/20/20  8:44 AM   Specimen: BLOOD  Result Value Ref Range Status   Specimen Description BLOOD BLOOD LEFT HAND  Final   Special Requests   Final    BOTTLES DRAWN AEROBIC AND ANAEROBIC Blood Culture adequate  volume   Culture   Final    NO GROWTH 5 DAYS Performed at [facility], [address], [place],  [address]    Report Status 11/25/2020 FINAL  Final  Urine Culture  Status: Abnormal   Collection Time: 11/20/20  2:09 PM   Specimen: Urine, Random  Result Value Ref Range Status   Specimen Description   Final    URINE, RANDOM Performed at [facility], [address], [place], [place] [address]    Special Requests   Final    NONE Performed at [facility], [address] [place], [place] [address]    Culture (A)  Final    >=100,000 COLONIES/mL ESCHERICHIA COLI Confirmed Extended Spectrum Beta-Lactamase Producer (ESBL).  In bloodstream infections from ESBL organisms, carbapenems are preferred over piperacillin/tazobactam. They are shown to have a lower risk of mortality.    Report Status 11/23/2020 FINAL  Final   Organism ID, Bacteria ESCHERICHIA COLI (A)  Final      Susceptibility   Escherichia coli - MIC*    AMPICILLIN >=32 RESISTANT Resistant     CEFAZOLIN >=64 RESISTANT Resistant     CEFEPIME 16 RESISTANT Resistant     CEFTRIAXONE >=64 RESISTANT Resistant     CIPROFLOXACIN >=4 RESISTANT Resistant     GENTAMICIN <=1 SENSITIVE Sensitive     IMIPENEM <=0.25 SENSITIVE Sensitive     NITROFURANTOIN <=16 SENSITIVE Sensitive     TRIMETH/SULFA >=320 RESISTANT Resistant     AMPICILLIN/SULBACTAM 4 SENSITIVE Sensitive     PIP/TAZO <=4 SENSITIVE Sensitive     * >=100,000 COLONIES/mL ESCHERICHIA COLI      Imaging Studies   No results found.   Medications   Scheduled Meds:  (feeding supplement) PROSource Plus  30 mL Oral TID BM   sodium chloride   Intravenous Once   apixaban  5 mg Oral BID   aspirin EC  81 mg Oral Daily   atorvastatin  40 mg Oral QPC supper   Chlorhexidine Gluconate Cloth  6 each Topical Daily   feeding supplement  1 Container Oral TID BM   ferrous gluconate  324 mg Oral QPC supper   guaiFENesin  15 mL Oral QID    isosorbide mononitrate  15 mg Oral Daily   mometasone-formoterol  2 puff Inhalation BID   multivitamin with minerals  1 tablet Oral Daily   pantoprazole  40 mg Oral BID   sodium chloride flush  10-40 mL Intracatheter Q12H   sodium chloride flush  3 mL Intravenous Q12H   tamsulosin  0.4 mg Oral Daily   Continuous Infusions:  sodium chloride     promethazine (PHENERGAN) injection (IM or IVPB)         LOS: 14 days    Time spent: 30 minutes    Ezekiel Slocumb, DO [facility]  11/25/2020, 1:26 PM      If 7PM-7AM, please contact night-coverage. How to contact the [facility] Attending or Consulting provider [place] or covering provider during after hours [place], for this patient?    Check the care team in [facility] and look for a) attending/consulting [facility] provider listed and b) the [facility] team listed Log into [email or web address] and use [place]'s universal password to access. If you do not have the password, please contact the hospital operator. Locate the [facility] provider you are looking for under [facility] and page to a number that you can be directly reached. If you still have difficulty reaching the provider, please page the [facility] (Director on Call) for the Hospitalists listed on amion for assistance.

## 2020-11-26 DIAGNOSIS — I5033 Acute on chronic diastolic (congestive) heart failure: Secondary | ICD-10-CM | POA: Diagnosis not present

## 2020-11-26 LAB — CBC
HCT: 29.8 % — ABNORMAL LOW (ref 39.0–52.0)
Hemoglobin: 9.3 g/dL — ABNORMAL LOW (ref 13.0–17.0)
MCH: 29 pg (ref 26.0–34.0)
MCHC: 31.2 g/dL (ref 30.0–36.0)
MCV: 92.8 fL (ref 80.0–100.0)
Platelets: 266 10*3/uL (ref 150–400)
RBC: 3.21 MIL/uL — ABNORMAL LOW (ref 4.22–5.81)
RDW: 16.1 % — ABNORMAL HIGH (ref 11.5–15.5)
WBC: 11.5 10*3/uL — ABNORMAL HIGH (ref 4.0–10.5)
nRBC: 0 % (ref 0.0–0.2)

## 2020-11-26 LAB — BASIC METABOLIC PANEL
Anion gap: 7 (ref 5–15)
BUN: 30 mg/dL — ABNORMAL HIGH (ref 8–23)
CO2: 31 mmol/L (ref 22–32)
Calcium: 8.7 mg/dL — ABNORMAL LOW (ref 8.9–10.3)
Chloride: 96 mmol/L — ABNORMAL LOW (ref 98–111)
Creatinine, Ser: 1.04 mg/dL (ref 0.61–1.24)
GFR, Estimated: >60 mL/min (ref >60)
Glucose, Bld: 150 mg/dL — ABNORMAL HIGH (ref 70–99)
Potassium: 3.8 mmol/L (ref 3.5–5.1)
Sodium: 134 mmol/L — ABNORMAL LOW (ref 135–145)

## 2020-11-26 LAB — MAGNESIUM: Magnesium: 1.8 mg/dL (ref 1.7–2.4)

## 2020-11-26 MED ORDER — POTASSIUM CHLORIDE CRYS ER 20 MEQ PO TBCR
40.0000 meq | EXTENDED_RELEASE_TABLET | Freq: Once | ORAL | Status: AC
  Administered 2020-11-26: 14:00:00 40 meq via ORAL
  Filled 2020-11-26: qty 2

## 2020-11-26 MED ORDER — FUROSEMIDE 10 MG/ML IJ SOLN
40.0000 mg | Freq: Four times a day (QID) | INTRAMUSCULAR | Status: AC
  Administered 2020-11-26 (×2): 40 mg via INTRAVENOUS
  Filled 2020-11-26 (×2): qty 4

## 2020-11-26 MED ORDER — MAGNESIUM SULFATE 2 GM/50ML IV SOLN
2.0000 g | Freq: Once | INTRAVENOUS | Status: AC
  Administered 2020-11-26: 14:00:00 2 g via INTRAVENOUS
  Filled 2020-11-26: qty 50

## 2020-11-26 NOTE — Progress Notes (Signed)
PROGRESS NOTE    [name] Browning   KYH:[id number]  DOB: 04/04/1940  PCP: [place]    DOA: 11/11/2020 LOS: 26    Brief Narrative / Hospital Course to Date:   [name] Browning is a 80 year old male with history of CAD (nonobstructive calcified coronary disease), atrial fibrillation on Eliquis, HFpEF, hypertension, TIA in 2016, COPD on 2 L who was admitted to the hospital on 11/2 with shortness of breath.   He has been admitted to the hospital 2 times in the last month.   First admission from 10/20/2020 to 10/31/2020 for respiratory failure due to COPD and pneumonia, as well as an obstructive left renal stone.  He developed hypotension and worsening respiratory distress requiring pressors and BiPAP.  Blood cultures grew Enterococcus faecalis and Staph hemolyticus, treated with antibiotics ending 11/03/2020.  Then he was readmitted from 10/25-10/28 with complaints of dysphagia.  He was seen by gastroenterology, underwent an EGD with no significant findings.  Since that time, he has had progressive worsening shortness of breath, eventually presented to the emergency department where he was found to have an oxygen saturation of 88% on his home 2 L of oxygen.   Assessment & Plan   Principal Problem:   Acute on chronic diastolic CHF (congestive heart failure) (HCC) Active Problems:   HTN (hypertension)   COPD, moderate (HCC)   Acute on chronic respiratory failure with hypoxia (HCC)   Atrial fibrillation (HCC)   DVT (deep venous thrombosis) (HCC)   HLD (hyperlipidemia)   Stroke (HCC)   CAD (coronary artery disease)   NSTEMI (non-ST elevated myocardial infarction) (HCC)   Hypokalemia   Thrombocytopenia (HCC)   Iron deficiency anemia   GI bleeding   Acute ST elevation myocardial infarction (STEMI) of anterior wall (HCC)   Cardiogenic shock (HCC)   Hemorrhagic shock (HCC)   Acute on chronic diastolic CHF Acute on chronic right heart failure Echo 11/6: LVEF 60-65%, LV regional wall  motion abnormalities, Grade 1 diastolic dysfunction, RV systolic function severely reduced with RV severe enlargement, LA + RA severely dilated Pt developed 2-3+ pitting edema of BLE's and scrotal swelling. Started IV diuresis on 11/13 Net IO Since Admission: -3,126.65 mL [11/26/20 1437] --Cardiology following, Dr. Erin Fulling - see recommendations --Lasix 40 mg IV q6h x 2 today and reassess --May require couple more days diuresis - still having orthopnea, persistent pitting edema, no change in weight.   --Strict I/O & daily weights --ACE wraps to BLE's --Elevate lower extremities  Recurrent chest pain, likely anginal - resolved Brief episodes, responsive to SL nitro.   Started on Imdur 15 mg by cardiology.   No chest pain since Imdur started. --Continue Imdur 15 mg daily   Bradycardia, intermittent - Due to mild sick sinus syndrome. Not symptomatic at this time. Episodes occur mainly in the setting of vagal maneuvers including urination and defecation. --avoid beta-blocker, per cardiology   Acute on chronic hypoxic respiratory failure - on 2 L/min at baseline.  Now stable, back on baseline oxygen. --Supplemental O2 to keep sats between 88-92%   Acute Anterior STEMI  Hx of Ischemic Cardiomyopathy CODE STEMI was called on 11/5 with ST elevations V2,V3.   Cardiac catheterization showed no significant obstruction in the LAD with occluded RCA and collaterals from LAD to RCA.  It appeared that RCA was chronically occluded. No intervention. Treated with heparin gtt, now off. --Continue ASA and statin --Hold beta blocker due to hypotension and intermittent bradycardia --Continue Imdur 15 mg daily (new)  Hypomagnesemia -replacing from Mg 1.5, 2 g IV mag sulfate ordered.   CARDIOGENIC SHOCK, resolved Hypotension, improved Weaned off Levophed and dopamine as of morning of 11/9 Was on midodrine while diuresing, now stopped per cardiology   Anemia, acute on chronic Iron deficiency versus  ? Acute blood loss secondary to GI bleed Presented with dark-colored stools and stool occult positive, drop in hgb which improved after pRBC transfusion.  --GI consulted -- no plan for endoscopy at this time --s/p 2 units pRBC's --cont oral PPI   ACUTE KIDNEY INJURY - multifactorial due to dehydration, IV contrast.   Renal function improved with gentle IVF hydration. --monitor renal function with diuresis   COPD - stable, no wheezing, on baseline 2 L/min O2 Continue bronchodilators   Dysuria - started on ceftriaxone by night team.  Urine cx grew E coli.   Completed 3 days of ceftriaxone.    Patient BMI: Body mass index is 25.93 kg/m.   DVT prophylaxis: Place and maintain sequential compression device Start: 11/11/20 1656 apixaban (ELIQUIS) tablet 5 mg   Diet:  Diet Orders (From admission, onward)     Start     Ordered   11/18/20 1203  DIET DYS 2 Room service appropriate? Yes; Fluid consistency: Thin  Diet effective now       Question Answer Comment  Room service appropriate? Yes   Fluid consistency: Thin      11/18/20 1203              Code Status: Full Code   Subjective 11/26/20    Patient up sitting in recliner when seen today.  He reports feeling much better today.  He does report that he feels "smothered" when he tries to lay flat.  Wants to get up walking more often.  Asked if PT will be around today.  He denies other acute complaints.   Disposition Plan & Communication   Status is: Inpatient  Remains inpatient appropriate because: May require further IV diuresis, closely monitoring volume status.  DC pending cardiology clearance.  Family communication: Daughter, [name] Browning, updated by phone this afternoon 11/13  Consults, Procedures, Significant Events   Consultants:  Cardiology, Dr. Nehemiah Massed  Procedures:  None  Antimicrobials:  Anti-infectives (From admission, onward)    Start     Dose/Rate Route Frequency Ordered Stop   11/21/20 1000  cefTRIAXone  (ROCEPHIN) 1 g in sodium chloride 0.9 % 100 mL IVPB  Status:  Discontinued        1 g 200 mL/hr over 30 Minutes Intravenous Every 24 hours 11/20/20 1351 11/22/20 1908   11/20/20 1000  cefTRIAXone (ROCEPHIN) 1 g in sodium chloride 0.9 % 100 mL IVPB  Status:  Discontinued        1 g 200 mL/hr over 30 Minutes Intravenous Every 12 hours 11/20/20 0614 11/20/20 1351         Micro    Objective   Vitals:   11/26/20 0400 11/26/20 0500 11/26/20 0734 11/26/20 1145  BP: 110/68  110/64 104/64  Pulse: 89   81  Resp: 17   20  Temp: 98 F (36.7 C)  97.6 F (36.4 C) (!) 97 F (36.1 C)  TempSrc: Oral  Oral   SpO2: 95%   93%  Weight:  91.6 kg    Height:        Intake/Output Summary (Last 24 hours) at 11/26/2020 1436 Last data filed at 11/26/2020 1216 Gross per 24 hour  Intake 1560 ml  Output 1200 ml  Net  360 ml   Filed Weights   11/24/20 0440 11/25/20 0325 11/26/20 0500  Weight: 91.9 kg 91.5 kg 91.6 kg    Physical Exam:  General exam: Sitting up in recliner, awake, alert, no acute distress HEENT: moist mucus membranes, hearing grossly normal  Respiratory system: CTAB, no wheezes, rales or rhonchi, normal respiratory effort, on 2 L/min nasal cannula oxygen. Cardiovascular system: normal S1/S2, RRR, still with 2-3+ lower extremity pitting edema bilaterally, Ace wraps in place to the distal lower extremities.   Gastrointestinal system: Distended but nontender abdomen Central nervous system: A&O x3. no gross focal neurologic deficits, normal speech Psychiatry: normal mood, congruent affect, judgement and insight appear normal  Labs   Data Reviewed: I have personally reviewed following labs and imaging studies  CBC: Recent Labs  Lab 11/22/20 0637 11/23/20 0344 11/24/20 0458 11/25/20 0330 11/26/20 0929  WBC 9.3 8.7 9.2 9.4 11.5*  HGB 9.2* 9.3* 8.4* 8.6* 9.3*  HCT 29.3* 29.9* 27.4* 27.5* 29.8*  MCV 90.7 92.0 91.3 92.3 92.8  PLT 148* 191 215 234 956   Basic Metabolic  Panel: Recent Labs  Lab 11/22/20 0637 11/23/20 0344 11/24/20 0458 11/25/20 0330 11/26/20 0929  NA 133* 133* 135 134* 134*  K 4.6 4.3 3.6 3.6 3.8  CL 98 97* 96* 94* 96*  CO2 28 29 32 34* 31  GLUCOSE 137* 109* 117* 119* 150*  BUN 37* 36* 30* 29* 30*  CREATININE 1.24 1.23 1.20 1.25* 1.04  CALCIUM 8.6* 8.4* 8.3* 8.4* 8.7*  MG 2.1 1.8 1.6* 1.5* 1.8   GFR: Estimated Creatinine Clearance: 67 mL/min (by C-G formula based on SCr of 1.04 mg/dL). Liver Function Tests: No results for input(s): AST, ALT, ALKPHOS, BILITOT, PROT, ALBUMIN in the last 168 hours. No results for input(s): LIPASE, AMYLASE in the last 168 hours. No results for input(s): AMMONIA in the last 168 hours. Coagulation Profile: No results for input(s): INR, PROTIME in the last 168 hours. Cardiac Enzymes: No results for input(s): CKTOTAL, CKMB, CKMBINDEX, TROPONINI in the last 168 hours. BNP (last 3 results) No results for input(s): PROBNP in the last 8760 hours. HbA1C: No results for input(s): HGBA1C in the last 72 hours. CBG: Recent Labs  Lab 11/20/20 0608  GLUCAP 111*   Lipid Profile: No results for input(s): CHOL, HDL, LDLCALC, TRIG, CHOLHDL, LDLDIRECT in the last 72 hours. Thyroid Function Tests: No results for input(s): TSH, T4TOTAL, FREET4, T3FREE, THYROIDAB in the last 72 hours. Anemia Panel: No results for input(s): VITAMINB12, FOLATE, FERRITIN, TIBC, IRON, RETICCTPCT in the last 72 hours. Sepsis Labs: No results for input(s): PROCALCITON, LATICACIDVEN in the last 168 hours.  Recent Results (from the past 240 hour(s))  CULTURE, BLOOD (ROUTINE X 2) w Reflex to ID Panel     Status: None   Collection Time: 11/20/20  8:44 AM   Specimen: BLOOD  Result Value Ref Range Status   Specimen Description BLOOD BLOOD RIGHT WRIST  Final   Special Requests   Final    BOTTLES DRAWN AEROBIC AND ANAEROBIC Blood Culture adequate volume   Culture   Final    NO GROWTH 5 DAYS Performed at [facility], [address] [place],  [address]    Report Status 11/25/2020 FINAL  Final  CULTURE, BLOOD (ROUTINE X 2) w Reflex to ID Panel     Status: None   Collection Time: 11/20/20  8:44 AM   Specimen: BLOOD  Result Value Ref Range Status   Specimen Description BLOOD BLOOD LEFT HAND  Final   Special Requests   Final    BOTTLES DRAWN AEROBIC AND ANAEROBIC Blood Culture adequate volume   Culture   Final    NO GROWTH 5 DAYS Performed at [facility], [address] [place], [place] [address]    Report Status 11/25/2020 FINAL  Final  Urine Culture     Status: Abnormal   Collection Time: 11/20/20  2:09 PM   Specimen: Urine, Random  Result Value Ref Range Status   Specimen Description   Final    URINE, RANDOM Performed at [facility], [address], [place], [place] [address]    Special Requests   Final    NONE Performed at [facility], [address] [place], [place] [address]    Culture (A)  Final    >=100,000 COLONIES/mL ESCHERICHIA COLI Confirmed Extended Spectrum Beta-Lactamase Producer (ESBL).  In bloodstream infections from ESBL organisms, carbapenems are preferred over piperacillin/tazobactam. They are shown to have a lower risk of mortality.    Report Status 11/23/2020 FINAL  Final   Organism ID, Bacteria ESCHERICHIA COLI (A)  Final      Susceptibility   Escherichia coli - MIC*    AMPICILLIN >=32 RESISTANT Resistant     CEFAZOLIN >=64 RESISTANT Resistant     CEFEPIME 16 RESISTANT Resistant     CEFTRIAXONE >=64 RESISTANT Resistant     CIPROFLOXACIN >=4 RESISTANT Resistant     GENTAMICIN <=1 SENSITIVE Sensitive     IMIPENEM <=0.25 SENSITIVE Sensitive     NITROFURANTOIN <=16 SENSITIVE Sensitive     TRIMETH/SULFA >=320 RESISTANT Resistant     AMPICILLIN/SULBACTAM 4 SENSITIVE Sensitive     PIP/TAZO <=4 SENSITIVE Sensitive     * >=100,000 COLONIES/mL ESCHERICHIA COLI      Imaging Studies   No results found.   Medications   Scheduled  Meds:  (feeding supplement) PROSource Plus  30 mL Oral TID BM   sodium chloride   Intravenous Once   apixaban  5 mg Oral BID   aspirin EC  81 mg Oral Daily   atorvastatin  40 mg Oral QPC supper   Chlorhexidine Gluconate Cloth  6 each Topical Daily   feeding supplement  1 Container Oral TID BM   ferrous gluconate  324 mg Oral QPC supper   guaiFENesin  15 mL Oral QID   isosorbide mononitrate  15 mg Oral Daily   mometasone-formoterol  2 puff Inhalation BID   multivitamin with minerals  1 tablet Oral Daily   pantoprazole  40 mg Oral BID   polyethylene glycol  17 g Oral Daily   senna-docusate  1 tablet Oral BID   sodium chloride flush  10-40 mL Intracatheter Q12H   sodium chloride flush  3 mL Intravenous Q12H   tamsulosin  0.4 mg Oral Daily   Continuous Infusions:  sodium chloride     magnesium sulfate bolus IVPB 2 g (11/26/20 1409)   promethazine (PHENERGAN) injection (IM or IVPB)         LOS: 15 days    Time spent: 25 minutes    Ezekiel Slocumb, DO [facility]  11/26/2020, 2:36 PM      If 7PM-7AM, please contact night-coverage. How to contact the [facility] Attending or Consulting provider [place] or covering provider during after hours [place], for this patient?    Check the care team in [facility] and look for a) attending/consulting [facility] provider listed and b) the [facility] team listed Log into [email or web address] and  use [place]'s universal password to access. If you do not have the password, please contact the hospital operator. Locate the [facility] provider you are looking for under [facility] and page to a number that you can be directly reached. If you still have difficulty reaching the provider, please page the [facility] (Director on Call) for the Hospitalists listed on amion for assistance.

## 2020-11-26 NOTE — Progress Notes (Signed)
Physical Therapy Treatment Patient Details Name: [name] Browning MRN: [id number] DOB: 1940/06/12 Today's Date: 11/26/2020   History of Present Illness [name] Browning is a 80 y.o. male with a past medical history of anxiety, CAD, COPD on 2 L of oxygen, prior CVA, CHF, hypertension, hyperlipidemia, presents to the emergency department for shortness of breath.  According to the patient he was recently discharged from the hospital 10/28 for shortness of breath due to CHF exacerbation. ICU stay since 11/05 for treatment of hypotension, hypoxemia, STEMI,  possible GI bleed.    PT Comments    Pt tired on arrival but eager to do a longer bout of ambulation if possible.  He did ultimately walk >100 ft but was clearly fatigued with the effort.  On 2L t/o the effort with sats staying >94%, HR remaining stable in the 60-80 range and no dizziness/lightheadedness (BP 114/64 in sitting).  Pt is still not near his baseline and will require HHPT/RW; he assures me his family (daughter, [name]) will be able to be around and assist until he is more mobile and independent.  Pt reliant on walker but no LOBs or overt safety issues.  Good overall effort and motivation with prolonged activity/ambulation.    Recommendations for follow up therapy are one component of a multi-disciplinary discharge planning process, led by the attending physician.  Recommendations may be updated based on patient status, additional functional criteria and insurance authorization.  Follow Up Recommendations  Home health PT     Assistance Recommended at Discharge Frequent or constant Supervision/Assistance  Equipment Recommendations  Rolling walker (2 wheels)    Recommendations for Other Services       Precautions / Restrictions Precautions Precautions: Fall Restrictions Weight Bearing Restrictions: No     Mobility  Bed Mobility Overal bed mobility: Independent Bed Mobility: Supine to Sit;Sit to Supine     Supine to sit:  Supervision Sit to supine: Supervision   General bed mobility comments: Pt reports b/l LEs feel heavy, but he was able to get LEs out/in bed w/o assist    Transfers Overall transfer level: Modified independent   Transfers: Sit to/from Stand Sit to Stand: Supervision           General transfer comment: Pt able to rise with light UE use and good confidence/balance    Ambulation/Gait Ambulation/Gait assistance: Supervision Gait Distance (Feet): 125 Feet Assistive device: Rolling walker (2 wheels)         General Gait Details: Pt was determinted to get around the pod loop and did so.  He was clearly faitgued with the effort, but vitals were appropriate t/o the effort and he needed only one very brief standing rest break.  Pt had no LOBs but was reliant on UEs/walker and reports that he likely could not have done a whole lot more due to fatigue.   Stairs             Wheelchair Mobility    Modified Rankin (Stroke Patients Only)       Balance Overall balance assessment: Modified Independent   Sitting balance-Leahy Scale: Good     Standing balance support: Bilateral upper extremity supported Standing balance-Leahy Scale: Fair                              Cognition Arousal/Alertness: Awake/alert Behavior During Therapy: WFL for tasks assessed/performed Overall Cognitive Status: Within Functional Limits for tasks assessed  Exercises      General Comments        Pertinent Vitals/Pain Pain Assessment: Faces Faces Pain Scale: Hurts a little bit Pain Location: b/l LE swelling/tightness    Home Living                          Prior Function            PT Goals (current goals can now be found in the care plan section) Progress towards PT goals: Progressing toward goals    Frequency    Min 2X/week      PT Plan Current plan remains appropriate    Co-evaluation               AM-PAC PT "6 Clicks" Mobility   Outcome Measure  Help needed turning from your back to your side while in a flat bed without using bedrails?: None Help needed moving from lying on your back to sitting on the side of a flat bed without using bedrails?: None Help needed moving to and from a bed to a chair (including a wheelchair)?: A Little Help needed standing up from a chair using your arms (e.g., wheelchair or bedside chair)?: None Help needed to walk in hospital room?: A Little Help needed climbing 3-5 steps with a railing? : A Lot 6 Click Score: 20    End of Session Equipment Utilized During Treatment: Oxygen;Gait belt (2L) Activity Tolerance: Patient limited by fatigue;Patient tolerated treatment well;No increased pain Patient left: with bed alarm set;with call bell/phone within reach   PT Visit Diagnosis: Unsteadiness on feet (R26.81);Other abnormalities of gait and mobility (R26.89);Muscle weakness (generalized) (M62.81);Difficulty in walking, not elsewhere classified (R26.2)     Time: 6195-0932 PT Time Calculation (min) (ACUTE ONLY): 31 min  Charges:  $Gait Training: 8-22 mins $Therapeutic Activity: 8-22 mins                     Kreg Shropshire, DPT 11/26/2020, 5:49 PM

## 2020-11-27 DIAGNOSIS — I5033 Acute on chronic diastolic (congestive) heart failure: Secondary | ICD-10-CM | POA: Diagnosis not present

## 2020-11-27 LAB — BASIC METABOLIC PANEL
Anion gap: 9 (ref 5–15)
BUN: 32 mg/dL — ABNORMAL HIGH (ref 8–23)
CO2: 31 mmol/L (ref 22–32)
Calcium: 8.8 mg/dL — ABNORMAL LOW (ref 8.9–10.3)
Chloride: 97 mmol/L — ABNORMAL LOW (ref 98–111)
Creatinine, Ser: 1.06 mg/dL (ref 0.61–1.24)
GFR, Estimated: >60 mL/min (ref >60)
Glucose, Bld: 125 mg/dL — ABNORMAL HIGH (ref 70–99)
Potassium: 4 mmol/L (ref 3.5–5.1)
Sodium: 137 mmol/L (ref 135–145)

## 2020-11-27 LAB — HEMOGLOBIN AND HEMATOCRIT, BLOOD
HCT: 30.1 % — ABNORMAL LOW (ref 39.0–52.0)
Hemoglobin: 9.2 g/dL — ABNORMAL LOW (ref 13.0–17.0)

## 2020-11-27 LAB — MAGNESIUM: Magnesium: 2.2 mg/dL (ref 1.7–2.4)

## 2020-11-27 LAB — TROPONIN I (HIGH SENSITIVITY)
Troponin I (High Sensitivity): 131 ng/L (ref <18)
Troponin I (High Sensitivity): 122 ng/L (ref <18)

## 2020-11-27 MED ORDER — ALBUMIN HUMAN 25 % IV SOLN
12.5000 g | Freq: Once | INTRAVENOUS | Status: AC
  Administered 2020-11-27: 12.5 g via INTRAVENOUS
  Filled 2020-11-27: qty 50

## 2020-11-27 MED ORDER — FUROSEMIDE 10 MG/ML IJ SOLN
40.0000 mg | Freq: Two times a day (BID) | INTRAMUSCULAR | Status: DC
  Administered 2020-11-27 – 2020-12-01 (×8): 40 mg via INTRAVENOUS
  Filled 2020-11-27 (×8): qty 4

## 2020-11-27 NOTE — Care Management Important Message (Signed)
Important Message  Patient Details  Name: [name] Browning MRN: [id number] Date of Birth: October 29, 1940   Medicare Important Message Given:  Yes  Reviewed Medicare IM with patient via room phone due to isolation status.  Confirmed he still has copy of Medicare IM in his room/at home so an additional copy not needed at this time.     Dannette Barbara 11/27/2020, 2:16 PM

## 2020-11-27 NOTE — Progress Notes (Signed)
PROGRESS NOTE    [name] Browning   BLT:[id number]  DOB: [birth year]  PCP: [place] [place]    DOA: 11/11/2020 LOS: 31    Brief Narrative / Hospital Course to Date:   [name] Browning is a 80 year old male with history of CAD (nonobstructive calcified coronary disease), atrial fibrillation on Eliquis, HFpEF, hypertension, TIA in 2016, COPD on 2 L who was admitted to the hospital on 11/2 with shortness of breath.   He has been admitted to the hospital 2 times in the last month.   First admission from 10/20/2020 to 10/31/2020 for respiratory failure due to COPD and pneumonia, as well as an obstructive left renal stone.  He developed hypotension and worsening respiratory distress requiring pressors and BiPAP.  Blood cultures grew Enterococcus faecalis and Staph hemolyticus, treated with antibiotics ending 11/03/2020.  Then he was readmitted from 10/25-10/28 with complaints of dysphagia.  He was seen by gastroenterology, underwent an EGD with no significant findings.  Since that time, he has had progressive worsening shortness of breath, eventually presented to the emergency department where he was found to have an oxygen saturation of 88% on his home 2 L of oxygen.   Assessment & Plan   Principal Problem:   Acute on chronic diastolic CHF (congestive heart failure) (HCC) Active Problems:   HTN (hypertension)   COPD, moderate (HCC)   Acute on chronic respiratory failure with hypoxia (HCC)   Atrial fibrillation (HCC)   DVT (deep venous thrombosis) (HCC)   HLD (hyperlipidemia)   Stroke (HCC)   CAD (coronary artery disease)   NSTEMI (non-ST elevated myocardial infarction) (HCC)   Hypokalemia   Thrombocytopenia (HCC)   Iron deficiency anemia   GI bleeding   Acute ST elevation myocardial infarction (STEMI) of anterior wall (HCC)   Cardiogenic shock (HCC)   Hemorrhagic shock (HCC)   Acute on chronic diastolic CHF Acute on chronic right heart failure Echo 11/6: LVEF 60-65%, LV regional wall  motion abnormalities, Grade 1 diastolic dysfunction, RV systolic function severely reduced with RV severe enlargement, LA + RA severely dilated Pt developed 2-3+ pitting edema of BLE's and scrotal swelling. Started IV diuresis on 11/13 Net IO Since Admission: -4,871.57 mL [11/27/20 1227] 11/18: good UO yesterday with 2 doses IV Lasix, edema persistent, wt down about 0.8 kg --Cardiology following, Dr. Erin Fulling - see recommendations --Will continue Lasix 40 mg IV BID --Strict I/O & daily weights --ACE wraps to BLE's --Elevate lower extremities  Recurrent chest pain, likely anginal - resolved Brief episodes, responsive to SL nitro.   Started on Imdur 15 mg by cardiology.   No chest pain since Imdur started. --Continue Imdur 15 mg daily   Bradycardia, intermittent - Due to mild sick sinus syndrome. Not symptomatic at this time. Episodes occur mainly in the setting of vagal maneuvers including urination and defecation. --avoid beta-blocker, per cardiology   Acute on chronic hypoxic respiratory failure - on 2 L/min at baseline.  Now stable, back on baseline oxygen. --Supplemental O2 to keep sats between 88-92%   Acute Anterior STEMI  Hx of Ischemic Cardiomyopathy CODE STEMI was called on 11/5 with ST elevations V2,V3.   Cardiac catheterization showed no significant obstruction in the LAD with occluded RCA and collaterals from LAD to RCA.  It appeared that RCA was chronically occluded. No intervention. Treated with heparin gtt, now off. --Continue ASA and statin --Hold beta blocker due to hypotension and intermittent bradycardia --Continue Imdur 15 mg daily (new)   Hypomagnesemia -replacing from Mg 1.5,  2 g IV mag sulfate ordered.   CARDIOGENIC SHOCK, resolved Hypotension, improved Weaned off Levophed and dopamine as of morning of 11/9 Was on midodrine while diuresing, now stopped per cardiology   Anemia, acute on chronic Iron deficiency versus ? Acute blood loss secondary to GI  bleed Presented with dark-colored stools and stool occult positive, drop in hgb which improved after pRBC transfusion.  --GI consulted -- no plan for endoscopy at this time --s/p 2 units pRBC's --cont oral PPI   ACUTE KIDNEY INJURY - multifactorial due to dehydration, IV contrast.   Renal function improved with gentle IVF hydration. --monitor renal function with diuresis   COPD - stable, no wheezing, on baseline 2 L/min O2 Continue bronchodilators   Dysuria - started on ceftriaxone by night team.  Urine cx grew E coli.   Completed 3 days of ceftriaxone.    Patient BMI: Body mass index is 25.7 kg/m.   DVT prophylaxis: Place and maintain sequential compression device Start: 11/11/20 1656 apixaban (ELIQUIS) tablet 5 mg   Diet:  Diet Orders (From admission, onward)     Start     Ordered   11/18/20 1203  DIET DYS 2 Room service appropriate? Yes; Fluid consistency: Thin  Diet effective now       Question Answer Comment  Room service appropriate? Yes   Fluid consistency: Thin      11/18/20 1203              Code Status: Full Code   Subjective 11/27/20    Patient up sitting in recliner when seen today.  Overnight the Ace wraps were painful, felt too tight, so they were removed.  He reports overall feeling well today.  No SOB, CP, N/V or other complaints.  Hopes to go home in next few days.   Disposition Plan & Communication   Status is: Inpatient  Remains inpatient appropriate because: May require further IV diuresis, closely monitoring volume status.  DC pending cardiology clearance and optimized volume status.  Family communication: Daughter, Baker Janus, updated by phone on afternoon of 11/13  Consults, Procedures, Significant Events   Consultants:  Cardiology, Dr. Nehemiah Massed  Procedures:  None  Antimicrobials:  Anti-infectives (From admission, onward)    Start     Dose/Rate Route Frequency Ordered Stop   11/21/20 1000  cefTRIAXone (ROCEPHIN) 1 g in sodium chloride  0.9 % 100 mL IVPB  Status:  Discontinued        1 g 200 mL/hr over 30 Minutes Intravenous Every 24 hours 11/20/20 1351 11/22/20 1908   11/20/20 1000  cefTRIAXone (ROCEPHIN) 1 g in sodium chloride 0.9 % 100 mL IVPB  Status:  Discontinued        1 g 200 mL/hr over 30 Minutes Intravenous Every 12 hours 11/20/20 0614 11/20/20 1351         Micro    Objective   Vitals:   11/27/20 0458 11/27/20 0727 11/27/20 1013 11/27/20 1122  BP: 110/64 119/61  109/69  Pulse: 73 78  79  Resp: 18 18  18   Temp: 97.8 F (36.6 C) (!) 97.5 F (36.4 C)  98 F (36.7 C)  TempSrc:      SpO2: 98% 95%  96%  Weight:   90.8 kg   Height:        Intake/Output Summary (Last 24 hours) at 11/27/2020 1227 Last data filed at 11/27/2020 1100 Gross per 24 hour  Intake 1005.08 ml  Output 1850 ml  Net -844.92 ml   Danley Danker  Weights   11/25/20 0325 11/26/20 0500 11/27/20 1013  Weight: 91.5 kg 91.6 kg 90.8 kg    Physical Exam:  General exam: Sitting up in recliner, awake, alert, no acute distress Respiratory system: CTAB, normal respiratory effort, on 2 L/min nasal cannula oxygen. Cardiovascular system: normal S1/S2, RRR, 3+ lower extremity pitting edema bilaterally, Ace wraps have been removed, legs elevated on chair.   Gastrointestinal system: Distended but nontender abdomen Central nervous system: A&O x3. no gross focal neurologic deficits, normal speech Psychiatry: normal mood, congruent affect, judgement and insight appear normal  Labs   Data Reviewed: I have personally reviewed following labs and imaging studies  CBC: Recent Labs  Lab 11/22/20 0637 11/23/20 0344 11/24/20 0458 11/25/20 0330 11/26/20 0929 11/27/20 0811  WBC 9.3 8.7 9.2 9.4 11.5*  --   HGB 9.2* 9.3* 8.4* 8.6* 9.3* 9.2*  HCT 29.3* 29.9* 27.4* 27.5* 29.8* 30.1*  MCV 90.7 92.0 91.3 92.3 92.8  --   PLT 148* 191 215 234 266  --    Basic Metabolic Panel: Recent Labs  Lab 11/23/20 0344 11/24/20 0458 11/25/20 0330 11/26/20 0929  11/27/20 0811  NA 133* 135 134* 134* 137  K 4.3 3.6 3.6 3.8 4.0  CL 97* 96* 94* 96* 97*  CO2 29 32 34* 31 31  GLUCOSE 109* 117* 119* 150* 125*  BUN 36* 30* 29* 30* 32*  CREATININE 1.23 1.20 1.25* 1.04 1.06  CALCIUM 8.4* 8.3* 8.4* 8.7* 8.8*  MG 1.8 1.6* 1.5* 1.8 2.2   GFR: Estimated Creatinine Clearance: 65.7 mL/min (by C-G formula based on SCr of 1.06 mg/dL). Liver Function Tests: No results for input(s): AST, ALT, ALKPHOS, BILITOT, PROT, ALBUMIN in the last 168 hours. No results for input(s): LIPASE, AMYLASE in the last 168 hours. No results for input(s): AMMONIA in the last 168 hours. Coagulation Profile: No results for input(s): INR, PROTIME in the last 168 hours. Cardiac Enzymes: No results for input(s): CKTOTAL, CKMB, CKMBINDEX, TROPONINI in the last 168 hours. BNP (last 3 results) No results for input(s): PROBNP in the last 8760 hours. HbA1C: No results for input(s): HGBA1C in the last 72 hours. CBG: No results for input(s): GLUCAP in the last 168 hours.  Lipid Profile: No results for input(s): CHOL, HDL, LDLCALC, TRIG, CHOLHDL, LDLDIRECT in the last 72 hours. Thyroid Function Tests: No results for input(s): TSH, T4TOTAL, FREET4, T3FREE, THYROIDAB in the last 72 hours. Anemia Panel: No results for input(s): VITAMINB12, FOLATE, FERRITIN, TIBC, IRON, RETICCTPCT in the last 72 hours. Sepsis Labs: No results for input(s): PROCALCITON, LATICACIDVEN in the last 168 hours.  Recent Results (from the past 240 hour(s))  CULTURE, BLOOD (ROUTINE X 2) w Reflex to ID Panel     Status: None   Collection Time: 11/20/20  8:44 AM   Specimen: BLOOD  Result Value Ref Range Status   Specimen Description BLOOD BLOOD RIGHT WRIST  Final   Special Requests   Final    BOTTLES DRAWN AEROBIC AND ANAEROBIC Blood Culture adequate volume   Culture   Final    NO GROWTH 5 DAYS Performed at [facility], [address] [place], [place] [address]    Report Status 11/25/2020 FINAL   Final  CULTURE, BLOOD (ROUTINE X 2) w Reflex to ID Panel     Status: None   Collection Time: 11/20/20  8:44 AM   Specimen: BLOOD  Result Value Ref Range Status   Specimen Description BLOOD BLOOD LEFT HAND  Final   Special Requests  Final    BOTTLES DRAWN AEROBIC AND ANAEROBIC Blood Culture adequate volume   Culture   Final    NO GROWTH 5 DAYS Performed at [facility], [address] [place], [place] [address]    Report Status 11/25/2020 FINAL  Final  Urine Culture     Status: Abnormal   Collection Time: 11/20/20  2:09 PM   Specimen: Urine, Random  Result Value Ref Range Status   Specimen Description   Final    URINE, RANDOM Performed at [facility], [address], [place], [place] [address]    Special Requests   Final    NONE Performed at [facility], [address] [place],  [address]    Culture (A)  Final    >=100,000 COLONIES/mL ESCHERICHIA COLI Confirmed Extended Spectrum Beta-Lactamase Producer (ESBL).  In bloodstream infections from ESBL organisms, carbapenems are preferred over piperacillin/tazobactam. They are shown to have a lower risk of mortality.    Report Status 11/23/2020 FINAL  Final   Organism ID, Bacteria ESCHERICHIA COLI (A)  Final      Susceptibility   Escherichia coli - MIC*    AMPICILLIN >=32 RESISTANT Resistant     CEFAZOLIN >=64 RESISTANT Resistant     CEFEPIME 16 RESISTANT Resistant     CEFTRIAXONE >=64 RESISTANT Resistant     CIPROFLOXACIN >=4 RESISTANT Resistant     GENTAMICIN <=1 SENSITIVE Sensitive     IMIPENEM <=0.25 SENSITIVE Sensitive     NITROFURANTOIN <=16 SENSITIVE Sensitive     TRIMETH/SULFA >=320 RESISTANT Resistant     AMPICILLIN/SULBACTAM 4 SENSITIVE Sensitive     PIP/TAZO <=4 SENSITIVE Sensitive     * >=100,000 COLONIES/mL ESCHERICHIA COLI      Imaging Studies   No results found.   Medications   Scheduled Meds:  (feeding supplement) PROSource Plus  30 mL Oral TID BM   sodium  chloride   Intravenous Once   apixaban  5 mg Oral BID   aspirin EC  81 mg Oral Daily   atorvastatin  40 mg Oral QPC supper   Chlorhexidine Gluconate Cloth  6 each Topical Daily   feeding supplement  1 Container Oral TID BM   ferrous gluconate  324 mg Oral QPC supper   guaiFENesin  15 mL Oral QID   isosorbide mononitrate  15 mg Oral Daily   mometasone-formoterol  2 puff Inhalation BID   multivitamin with minerals  1 tablet Oral Daily   pantoprazole  40 mg Oral BID   polyethylene glycol  17 g Oral Daily   senna-docusate  1 tablet Oral BID   sodium chloride flush  10-40 mL Intracatheter Q12H   sodium chloride flush  3 mL Intravenous Q12H   tamsulosin  0.4 mg Oral Daily   Continuous Infusions:  sodium chloride     promethazine (PHENERGAN) injection (IM or IVPB)         LOS: 16 days    Time spent: 25 minutes with > 50% spent at bedside and in coordination of care     Ezekiel Slocumb, DO [facility]  11/27/2020, 12:27 PM      If 7PM-7AM, please contact night-coverage. How to contact the [facility] Attending or Consulting provider [place] or covering provider during after hours [place], for this patient?    Check the care team in [facility] and look for a) attending/consulting [facility] provider listed and b) the [facility] team listed Log into [email or web address] and use [place]'s universal password  to access. If you do not have the password, please contact the hospital operator. Locate the [facility] provider you are looking for under [facility] and page to a number that you can be directly reached. If you still have difficulty reaching the provider, please page the [facility] (Director on Call) for the Hospitalists listed on amion for assistance.

## 2020-11-27 NOTE — Progress Notes (Addendum)
PT Cancellation Note  Patient Details Name: [name] Browning MRN: [id number] DOB: 02/28/1940   Cancelled Treatment:    Reason Eval/Treat Not Completed: Patient declined, no reason specified. Second attempt made. Pt reports requesting to hold for another time. States he had an episode of chest tightness. Unable to contact attending RN but pt reports RN is aware. PT to message RN. Will continue to follow.  Per RN via messenger, awaiting labs due to reports of SOB and chest tightness. Will re-attempt when medically appropriate.   [name]. Fairly IV, PT, DPT Physical Therapist- [facility]  11/27/2020, 3:44 PM

## 2020-11-27 NOTE — Progress Notes (Signed)
Received call from granddaughter saying "My grandfather is calling me crying saying the wraps on his legs are too tight" and demanding "whatever it is" to be removed. Upon entering the pts room, he is calm and pleasant, sitting at the side of the bed. When I asked about the wraps causing pain, the pt says "yeah, they are a little uncomfortable." At this time I removed the ACE wraps to alleviate the discomfort. Will reapply as tolerated by the patient.

## 2020-11-27 NOTE — Progress Notes (Signed)
PT Cancellation Note  Patient Details Name: [name] Browning MRN: [id number] DOB: [birth year]   Cancelled Treatment:    Reason Eval/Treat Not Completed: Patient declined, no reason specified. Pt requesting he would like to eat before participating in PT. Will re-attempt as able.   [name]. Fairly IV, PT, DPT Physical Therapist- [facility]  11/27/2020, 1:50 PM

## 2020-11-28 DIAGNOSIS — I5033 Acute on chronic diastolic (congestive) heart failure: Secondary | ICD-10-CM | POA: Diagnosis not present

## 2020-11-28 LAB — BASIC METABOLIC PANEL
Anion gap: 6 (ref 5–15)
BUN: 30 mg/dL — ABNORMAL HIGH (ref 8–23)
CO2: 33 mmol/L — ABNORMAL HIGH (ref 22–32)
Calcium: 8.4 mg/dL — ABNORMAL LOW (ref 8.9–10.3)
Chloride: 96 mmol/L — ABNORMAL LOW (ref 98–111)
Creatinine, Ser: 0.9 mg/dL (ref 0.61–1.24)
GFR, Estimated: >60 mL/min (ref >60)
Glucose, Bld: 109 mg/dL — ABNORMAL HIGH (ref 70–99)
Potassium: 3.5 mmol/L (ref 3.5–5.1)
Sodium: 135 mmol/L (ref 135–145)

## 2020-11-28 LAB — MAGNESIUM: Magnesium: 2 mg/dL (ref 1.7–2.4)

## 2020-11-28 MED ORDER — ALBUMIN HUMAN 5 % IV SOLN: 12.5000 g | Freq: Once | INTRAVENOUS | Status: DC

## 2020-11-28 MED ORDER — POTASSIUM CHLORIDE CRYS ER 20 MEQ PO TBCR
40.0000 meq | EXTENDED_RELEASE_TABLET | ORAL | Status: AC
  Administered 2020-11-28 (×2): 40 meq via ORAL
  Filled 2020-11-28: qty 4
  Filled 2020-11-28: qty 2

## 2020-11-28 NOTE — Plan of Care (Signed)

## 2020-11-28 NOTE — Progress Notes (Signed)
PROGRESS NOTE    [name] Browning   ANV:[id number]  DOB: Dec 19, 1940  PCP: [place] [place]    DOA: 11/11/2020 LOS: 41    Brief Narrative / Hospital Course to Date:   [name] Browning is a 80 year old male with history of CAD (nonobstructive calcified coronary disease), atrial fibrillation on Eliquis, HFpEF, hypertension, TIA in 2016, COPD on 2 L who was admitted to the hospital on 11/2 with shortness of breath.   He has been admitted to the hospital 2 times in the last month.   First admission from 10/20/2020 to 10/31/2020 for respiratory failure due to COPD and pneumonia, as well as an obstructive left renal stone.  He developed hypotension and worsening respiratory distress requiring pressors and BiPAP.  Blood cultures grew Enterococcus faecalis and Staph hemolyticus, treated with antibiotics ending 11/03/2020.  Then he was readmitted from 10/25-10/28 with complaints of dysphagia.  He was seen by gastroenterology, underwent an EGD with no significant findings.  Since that time, he has had progressive worsening shortness of breath, eventually presented to the emergency department where he was found to have an oxygen saturation of 88% on his home 2 L of oxygen.   Assessment & Plan   Principal Problem:   Acute on chronic diastolic CHF (congestive heart failure) (HCC) Active Problems:   HTN (hypertension)   COPD, moderate (HCC)   Acute on chronic respiratory failure with hypoxia (HCC)   Atrial fibrillation (HCC)   DVT (deep venous thrombosis) (HCC)   HLD (hyperlipidemia)   Stroke (HCC)   CAD (coronary artery disease)   NSTEMI (non-ST elevated myocardial infarction) (HCC)   Hypokalemia   Thrombocytopenia (HCC)   Iron deficiency anemia   GI bleeding   Acute ST elevation myocardial infarction (STEMI) of anterior wall (HCC)   Cardiogenic shock (HCC)   Hemorrhagic shock (HCC)   Acute on chronic diastolic CHF Acute on chronic right heart failure Echo 11/6: LVEF 60-65%, LV regional wall  motion abnormalities, Grade 1 diastolic dysfunction, RV systolic function severely reduced with RV severe enlargement, LA + RA severely dilated Pt developed 2-3+ pitting edema of BLE's and scrotal swelling. Started IV diuresis on 11/13 Net IO Since Admission: -7,098.57 mL [11/28/20 1408] 11/18: good UO yesterday with 2 doses IV Lasix, edema persistent, wt down about 0.8 kg 11/19: Continue has good urine output, IV albumin given with second dose of Lasix yesterday due to soft BP --Cardiology following, Dr. Erin Fulling - see recommendations --Will continue Lasix 40 mg IV BID --Give IV albumin if BP is soft prior to Lasix --Strict I/O & daily weights -- TED hose on BLE's --Elevate lower extremities  Recurrent chest pain, likely anginal - resolved Brief episodes, responsive to SL nitro.   Started on Imdur 15 mg by cardiology.   No chest pain since Imdur started. --Continue Imdur 15 mg daily   Bradycardia, intermittent - Due to mild sick sinus syndrome. Not symptomatic at this time. Episodes occur mainly in the setting of vagal maneuvers including urination and defecation. --avoid beta-blocker, per cardiology   Acute on chronic hypoxic respiratory failure - on 2 L/min at baseline.  Now stable, back on baseline oxygen. --Supplemental O2 to keep sats between 88-92%   Acute Anterior STEMI  Hx of Ischemic Cardiomyopathy CODE STEMI was called on 11/5 with ST elevations V2,V3.   Cardiac catheterization showed no significant obstruction in the LAD with occluded RCA and collaterals from LAD to RCA.  It appeared that RCA was chronically occluded. No intervention. Treated with  heparin gtt, now off. --Continue ASA and statin --Hold beta blocker due to hypotension and intermittent bradycardia --Continue Imdur 15 mg daily (new)   Hypomagnesemia -replacing from Mg 1.5, 2 g IV mag sulfate ordered.   CARDIOGENIC SHOCK, resolved Hypotension, improved Weaned off Levophed and dopamine as of morning of  11/9 Was on midodrine while diuresing, now stopped per cardiology   Anemia, acute on chronic Iron deficiency versus ? Acute blood loss secondary to GI bleed Presented with dark-colored stools and stool occult positive, drop in hgb which improved after pRBC transfusion.  --GI consulted -- no plan for endoscopy at this time --s/p 2 units pRBC's --cont oral PPI   ACUTE KIDNEY INJURY - multifactorial due to dehydration, IV contrast.   Renal function improved with gentle IVF hydration. --monitor renal function with diuresis   COPD - stable, no wheezing, on baseline 2 L/min O2 Continue bronchodilators   Dysuria - started on ceftriaxone by night team.  Urine cx grew E coli.   Completed 3 days of ceftriaxone.    Patient BMI: Body mass index is 25.34 kg/m.   DVT prophylaxis: Place and maintain sequential compression device Start: 11/11/20 1656 apixaban (ELIQUIS) tablet 5 mg   Diet:  Diet Orders (From admission, onward)     Start     Ordered   11/18/20 1203  DIET DYS 2 Room service appropriate? Yes; Fluid consistency: Thin  Diet effective now       Question Answer Comment  Room service appropriate? Yes   Fluid consistency: Thin      11/18/20 1203              Code Status: Full Code   Subjective 11/28/20    Patient is awake resting in bed when seen today.  He has an emesis bag beside him and reports feeling nauseous since yesterday evening, on and off.  Has not actually vomited.  States the TED hose on his legs feel comfortable at this time.  Denies any fevers chills, no diarrhea but is having bowel movements.  Denies chest pain or shortness of breath.   Disposition Plan & Communication   Status is: Inpatient  Remains inpatient appropriate because: Requires further IV diuresis, discharge home pending optimized volume status and cardiology clearance.  Family communication: Daughter, Baker Janus, updated by phone on afternoon of 11/13  Consults, Procedures, Significant Events    Consultants:  Cardiology, Dr. Nehemiah Massed  Procedures:  None  Antimicrobials:  Anti-infectives (From admission, onward)    Start     Dose/Rate Route Frequency Ordered Stop   11/21/20 1000  cefTRIAXone (ROCEPHIN) 1 g in sodium chloride 0.9 % 100 mL IVPB  Status:  Discontinued        1 g 200 mL/hr over 30 Minutes Intravenous Every 24 hours 11/20/20 1351 11/22/20 1908   11/20/20 1000  cefTRIAXone (ROCEPHIN) 1 g in sodium chloride 0.9 % 100 mL IVPB  Status:  Discontinued        1 g 200 mL/hr over 30 Minutes Intravenous Every 12 hours 11/20/20 0614 11/20/20 1351         Micro    Objective   Vitals:   11/28/20 0029 11/28/20 0417 11/28/20 0734 11/28/20 1143  BP: 109/71 (!) 101/58 (!) 99/52 (!) 97/52  Pulse: 83 75 70 77  Resp: 20 19 19 18   Temp: 98.9 F (37.2 C) 98.9 F (37.2 C) 98.4 F (36.9 C) 98.6 F (37 C)  TempSrc:    Oral  SpO2: 95% 95% 97% 96%  Weight:  89.5 kg    Height:        Intake/Output Summary (Last 24 hours) at 11/28/2020 1408 Last data filed at 11/28/2020 1336 Gross per 24 hour  Intake 1123 ml  Output 3350 ml  Net -2227 ml   Filed Weights   11/26/20 0500 11/27/20 1013 11/28/20 0417  Weight: 91.6 kg 90.8 kg 89.5 kg    Physical Exam:  General exam: Awake, resting in bed, no acute distress Respiratory system: Lungs clear bilaterally, on 2 L/min nasal cannula oxygen, normal respiratory effort. Cardiovascular system: Regular rate and rhythm, 2+ lower extremity pitting edema bilaterally (improving),  Extremities: TED hose on bilateral legs, improved lower extremity edema 1-2+ pitting Gastrointestinal system: Nontender abdomen, present bowel sounds Central nervous system: A&O x3. no gross focal neurologic deficits, normal speech Psychiatry: normal mood, congruent affect, judgement and insight appear normal  Labs   Data Reviewed: I have personally reviewed following labs and imaging studies  CBC: Recent Labs  Lab 11/22/20 0637 11/23/20 0344  11/24/20 0458 11/25/20 0330 11/26/20 0929 11/27/20 0811  WBC 9.3 8.7 9.2 9.4 11.5*  --   HGB 9.2* 9.3* 8.4* 8.6* 9.3* 9.2*  HCT 29.3* 29.9* 27.4* 27.5* 29.8* 30.1*  MCV 90.7 92.0 91.3 92.3 92.8  --   PLT 148* 191 215 234 266  --    Basic Metabolic Panel: Recent Labs  Lab 11/24/20 0458 11/25/20 0330 11/26/20 0929 11/27/20 0811 11/28/20 0548  NA 135 134* 134* 137 135  K 3.6 3.6 3.8 4.0 3.5  CL 96* 94* 96* 97* 96*  CO2 32 34* 31 31 33*  GLUCOSE 117* 119* 150* 125* 109*  BUN 30* 29* 30* 32* 30*  CREATININE 1.20 1.25* 1.04 1.06 0.90  CALCIUM 8.3* 8.4* 8.7* 8.8* 8.4*  MG 1.6* 1.5* 1.8 2.2 2.0   GFR: Estimated Creatinine Clearance: 77.4 mL/min (by C-G formula based on SCr of 0.9 mg/dL). Liver Function Tests: No results for input(s): AST, ALT, ALKPHOS, BILITOT, PROT, ALBUMIN in the last 168 hours. No results for input(s): LIPASE, AMYLASE in the last 168 hours. No results for input(s): AMMONIA in the last 168 hours. Coagulation Profile: No results for input(s): INR, PROTIME in the last 168 hours. Cardiac Enzymes: No results for input(s): CKTOTAL, CKMB, CKMBINDEX, TROPONINI in the last 168 hours. BNP (last 3 results) No results for input(s): PROBNP in the last 8760 hours. HbA1C: No results for input(s): HGBA1C in the last 72 hours. CBG: No results for input(s): GLUCAP in the last 168 hours.  Lipid Profile: No results for input(s): CHOL, HDL, LDLCALC, TRIG, CHOLHDL, LDLDIRECT in the last 72 hours. Thyroid Function Tests: No results for input(s): TSH, T4TOTAL, FREET4, T3FREE, THYROIDAB in the last 72 hours. Anemia Panel: No results for input(s): VITAMINB12, FOLATE, FERRITIN, TIBC, IRON, RETICCTPCT in the last 72 hours. Sepsis Labs: No results for input(s): PROCALCITON, LATICACIDVEN in the last 168 hours.  Recent Results (from the past 240 hour(s))  CULTURE, BLOOD (ROUTINE X 2) w Reflex to ID Panel     Status: None   Collection Time: 11/20/20  8:44 AM   Specimen: BLOOD   Result Value Ref Range Status   Specimen Description BLOOD BLOOD RIGHT WRIST  Final   Special Requests   Final    BOTTLES DRAWN AEROBIC AND ANAEROBIC Blood Culture adequate volume   Culture   Final    NO GROWTH 5 DAYS Performed at [facility], [address], [place], [place] [address]    Report Status 11/25/2020  FINAL  Final  CULTURE, BLOOD (ROUTINE X 2) w Reflex to ID Panel     Status: None   Collection Time: 11/20/20  8:44 AM   Specimen: BLOOD  Result Value Ref Range Status   Specimen Description BLOOD BLOOD LEFT HAND  Final   Special Requests   Final    BOTTLES DRAWN AEROBIC AND ANAEROBIC Blood Culture adequate volume   Culture   Final    NO GROWTH 5 DAYS Performed at [facility], [address], [place], [place] [address]    Report Status 11/25/2020 FINAL  Final  Urine Culture     Status: Abnormal   Collection Time: 11/20/20  2:09 PM   Specimen: Urine, Random  Result Value Ref Range Status   Specimen Description   Final    URINE, RANDOM Performed at [facility], [address], [place], [place] [address]    Special Requests   Final    NONE Performed at [facility], [address] [place],  [address]    Culture (A)  Final    >=100,000 COLONIES/mL ESCHERICHIA COLI Confirmed Extended Spectrum Beta-Lactamase Producer (ESBL).  In bloodstream infections from ESBL organisms, carbapenems are preferred over piperacillin/tazobactam. They are shown to have a lower risk of mortality.    Report Status 11/23/2020 FINAL  Final   Organism ID, Bacteria ESCHERICHIA COLI (A)  Final      Susceptibility   Escherichia coli - MIC*    AMPICILLIN >=32 RESISTANT Resistant     CEFAZOLIN >=64 RESISTANT Resistant     CEFEPIME 16 RESISTANT Resistant     CEFTRIAXONE >=64 RESISTANT Resistant     CIPROFLOXACIN >=4 RESISTANT Resistant     GENTAMICIN <=1 SENSITIVE Sensitive     IMIPENEM <=0.25 SENSITIVE Sensitive     NITROFURANTOIN <=16  SENSITIVE Sensitive     TRIMETH/SULFA >=320 RESISTANT Resistant     AMPICILLIN/SULBACTAM 4 SENSITIVE Sensitive     PIP/TAZO <=4 SENSITIVE Sensitive     * >=100,000 COLONIES/mL ESCHERICHIA COLI      Imaging Studies   No results found.   Medications   Scheduled Meds:  (feeding supplement) PROSource Plus  30 mL Oral TID BM   sodium chloride   Intravenous Once   apixaban  5 mg Oral BID   aspirin EC  81 mg Oral Daily   atorvastatin  40 mg Oral QPC supper   Chlorhexidine Gluconate Cloth  6 each Topical Daily   feeding supplement  1 Container Oral TID BM   ferrous gluconate  324 mg Oral QPC supper   furosemide  40 mg Intravenous BID   guaiFENesin  15 mL Oral QID   isosorbide mononitrate  15 mg Oral Daily   mometasone-formoterol  2 puff Inhalation BID   multivitamin with minerals  1 tablet Oral Daily   pantoprazole  40 mg Oral BID   polyethylene glycol  17 g Oral Daily   senna-docusate  1 tablet Oral BID   sodium chloride flush  10-40 mL Intracatheter Q12H   sodium chloride flush  3 mL Intravenous Q12H   tamsulosin  0.4 mg Oral Daily   Continuous Infusions:  sodium chloride     promethazine (PHENERGAN) injection (IM or IVPB)         LOS: 17 days    Time spent: 25 minutes with > 50% spent at bedside and in coordination of care     Ezekiel Slocumb, DO [facility]  11/28/2020, 2:08 PM  If 7PM-7AM, please contact night-coverage. How to contact the [facility] Attending or Consulting provider [place] or covering provider during after hours [place], for this patient?    Check the care team in [facility] and look for a) attending/consulting [facility] provider listed and b) the [facility] team listed Log into [email or web address] and use [place]'s universal password to access. If you do not have the password, please contact the hospital operator. Locate the [facility] provider you are looking for under [facility] and page to a number that you can be directly reached. If you still have  difficulty reaching the provider, please page the [facility] (Director on Call) for the Hospitalists listed on amion for assistance.

## 2020-11-29 DIAGNOSIS — I5033 Acute on chronic diastolic (congestive) heart failure: Secondary | ICD-10-CM | POA: Diagnosis not present

## 2020-11-29 LAB — BASIC METABOLIC PANEL
Anion gap: 7 (ref 5–15)
BUN: 29 mg/dL — ABNORMAL HIGH (ref 8–23)
CO2: 34 mmol/L — ABNORMAL HIGH (ref 22–32)
Calcium: 8.8 mg/dL — ABNORMAL LOW (ref 8.9–10.3)
Chloride: 95 mmol/L — ABNORMAL LOW (ref 98–111)
Creatinine, Ser: 1.04 mg/dL (ref 0.61–1.24)
GFR, Estimated: >60 mL/min (ref >60)
Glucose, Bld: 109 mg/dL — ABNORMAL HIGH (ref 70–99)
Potassium: 3.9 mmol/L (ref 3.5–5.1)
Sodium: 136 mmol/L (ref 135–145)

## 2020-11-29 MED ORDER — POTASSIUM CHLORIDE CRYS ER 20 MEQ PO TBCR: 40.0000 meq | EXTENDED_RELEASE_TABLET | Freq: Two times a day (BID) | ORAL | Status: DC

## 2020-11-29 MED ORDER — TRAZODONE HCL 50 MG PO TABS
25.0000 mg | ORAL_TABLET | Freq: Every evening | ORAL | Status: DC | PRN
  Administered 2020-11-29: 25 mg via ORAL
  Filled 2020-11-29: qty 1

## 2020-11-29 MED ORDER — POTASSIUM CHLORIDE CRYS ER 20 MEQ PO TBCR
40.0000 meq | EXTENDED_RELEASE_TABLET | Freq: Two times a day (BID) | ORAL | Status: AC
  Administered 2020-11-29 (×2): 40 meq via ORAL
  Filled 2020-11-29 (×2): qty 4

## 2020-11-29 NOTE — Progress Notes (Signed)
Patient complained of insomnia. Attending notified and new order given.

## 2020-11-29 NOTE — Plan of Care (Signed)

## 2020-11-29 NOTE — Progress Notes (Signed)
PROGRESS NOTE    [name] Browning   GYF:[id number]  DOB: Oct 09, 1940  PCP: [place]    DOA: 11/11/2020 LOS: 40    Brief Narrative / Hospital Course to Date:   [name] Browning is a 80 year old male with history of CAD (nonobstructive calcified coronary disease), atrial fibrillation on Eliquis, HFpEF, hypertension, TIA in 2016, COPD on 2 L who was admitted to the hospital on 11/2 with shortness of breath.   He has been admitted to the hospital 2 times in the last month.   First admission from 10/20/2020 to 10/31/2020 for respiratory failure due to COPD and pneumonia, as well as an obstructive left renal stone.  He developed hypotension and worsening respiratory distress requiring pressors and BiPAP.  Blood cultures grew Enterococcus faecalis and Staph hemolyticus, treated with antibiotics ending 11/03/2020.  Then he was readmitted from 10/25-10/28 with complaints of dysphagia.  He was seen by gastroenterology, underwent an EGD with no significant findings.  Since that time, he has had progressive worsening shortness of breath, eventually presented to the emergency department where he was found to have an oxygen saturation of 88% on his home 2 L of oxygen.   Assessment & Plan   Principal Problem:   Acute on chronic diastolic CHF (congestive heart failure) (HCC) Active Problems:   HTN (hypertension)   COPD, moderate (HCC)   Acute on chronic respiratory failure with hypoxia (HCC)   Atrial fibrillation (HCC)   DVT (deep venous thrombosis) (HCC)   HLD (hyperlipidemia)   Stroke (HCC)   CAD (coronary artery disease)   NSTEMI (non-ST elevated myocardial infarction) (HCC)   Hypokalemia   Thrombocytopenia (HCC)   Iron deficiency anemia   GI bleeding   Acute ST elevation myocardial infarction (STEMI) of anterior wall (HCC)   Cardiogenic shock (HCC)   Hemorrhagic shock (HCC)   Acute on chronic diastolic CHF Acute on chronic right heart failure Echo 11/6: LVEF 60-65%, LV regional wall  motion abnormalities, Grade 1 diastolic dysfunction, RV systolic function severely reduced with RV severe enlargement, LA + RA severely dilated Pt developed 2-3+ pitting edema of BLE's and scrotal swelling. Started IV diuresis on 11/13 Net IO Since Admission: -10,868.57 mL [11/29/20 1617] 11/20: stable renal function, continues to have very good UO, put out 4350 cc yesterday --Cardiology following, Dr. Erin Fulling - see recommendations --Continue Lasix 40 mg IV BID --Give IV albumin if BP is soft prior to Lasix --Strict I/O & daily weights -- TED hose on BLE's --Elevate lower extremities  Recurrent chest pain, likely anginal - resolved Brief episodes, responsive to SL nitro.   Started on Imdur 15 mg by cardiology.   No chest pain since Imdur started. --Continue Imdur 15 mg daily   Bradycardia, intermittent - Due to mild sick sinus syndrome. Not symptomatic at this time. Episodes occur mainly in the setting of vagal maneuvers including urination and defecation. --avoid beta-blocker, per cardiology   Acute on chronic hypoxic respiratory failure - on 2 L/min at baseline.  Now stable, back on baseline oxygen. --Supplemental O2 to keep sats between 88-92%   Acute Anterior STEMI  Hx of Ischemic Cardiomyopathy CODE STEMI was called on 11/5 with ST elevations V2,V3.   Cardiac catheterization showed no significant obstruction in the LAD with occluded RCA and collaterals from LAD to RCA.  It appeared that RCA was chronically occluded. No intervention. Treated with heparin gtt, now off. --Continue ASA and statin --Hold beta blocker due to hypotension and intermittent bradycardia --Continue Imdur 15 mg  daily (new)   Hypomagnesemia -replacing from Mg 1.5, 2 g IV mag sulfate ordered.   CARDIOGENIC SHOCK, resolved Hypotension, improved Weaned off Levophed and dopamine as of morning of 11/9 Was on midodrine while diuresing, now stopped per cardiology   Anemia, acute on chronic Iron  deficiency versus ? Acute blood loss secondary to GI bleed Presented with dark-colored stools and stool occult positive, drop in hgb which improved after pRBC transfusion.  --GI consulted -- no plan for endoscopy at this time --s/p 2 units pRBC's --cont oral PPI   ACUTE KIDNEY INJURY - multifactorial due to dehydration, IV contrast.   Renal function improved with gentle IVF hydration. --monitor renal function with diuresis   COPD - stable, no wheezing, on baseline 2 L/min O2 Continue bronchodilators   Dysuria - started on ceftriaxone by night team.  Urine cx grew E coli.   Completed 3 days of ceftriaxone.    Patient BMI: Body mass index is 25.06 kg/m.   DVT prophylaxis: Place and maintain sequential compression device Start: 11/11/20 1656 apixaban (ELIQUIS) tablet 5 mg   Diet:  Diet Orders (From admission, onward)     Start     Ordered   11/18/20 1203  DIET DYS 2 Room service appropriate? Yes; Fluid consistency: Thin  Diet effective now       Question Answer Comment  Room service appropriate? Yes   Fluid consistency: Thin      11/18/20 1203              Code Status: Full Code   Subjective 11/29/20    Patient reports feeling well overall.  TED hose felt too tight, so the staff rolled them down which he states helped.  He reports good urine output. No acute complaints or acute events reported.    Disposition Plan & Communication   Status is: Inpatient  Remains inpatient appropriate because: Requires further IV diuresis, discharge home pending optimized volume status and cardiology clearance.  Family communication: Daughter, Baker Janus, updated by phone on afternoon of 11/13  Consults, Procedures, Significant Events   Consultants:  Cardiology, Dr. Nehemiah Massed  Procedures:  None  Antimicrobials:  Anti-infectives (From admission, onward)    Start     Dose/Rate Route Frequency Ordered Stop   11/21/20 1000  cefTRIAXone (ROCEPHIN) 1 g in sodium chloride 0.9 % 100 mL  IVPB  Status:  Discontinued        1 g 200 mL/hr over 30 Minutes Intravenous Every 24 hours 11/20/20 1351 11/22/20 1908   11/20/20 1000  cefTRIAXone (ROCEPHIN) 1 g in sodium chloride 0.9 % 100 mL IVPB  Status:  Discontinued        1 g 200 mL/hr over 30 Minutes Intravenous Every 12 hours 11/20/20 0614 11/20/20 1351         Micro    Objective   Vitals:   11/29/20 0521 11/29/20 0808 11/29/20 1125 11/29/20 1520  BP: (!) 108/58 117/71 120/77 (!) 119/56  Pulse: 74 74 77 67  Resp: 16 18 18 19   Temp: 97.8 F (36.6 C) 98.2 F (36.8 C) 98.3 F (36.8 C) 98.4 F (36.9 C)  TempSrc:   Oral Oral  SpO2: 95% 96% 96% 99%  Weight:      Height:        Intake/Output Summary (Last 24 hours) at 11/29/2020 1617 Last data filed at 11/29/2020 1521 Gross per 24 hour  Intake 730 ml  Output 4500 ml  Net -3770 ml   Filed Weights   11/27/20  1013 11/28/20 0417 11/29/20 0100  Weight: 90.8 kg 89.5 kg 88.5 kg    Physical Exam:  General exam: Awake, resting in bed, no acute distress Respiratory system: on 2.5 L/min nasal cannula oxygen, normal respiratory effort. Cardiovascular system: Regular rate and rhythm, 2-3+ lower extremity pitting edema bilaterally (persistent)  Extremities: TED hose on bilateral legs rolled down, pitting edema slightly worse Central nervous system: A&O x3. no gross focal neurologic deficits, normal speech Psychiatry: normal mood, congruent affect, judgement and insight appear normal  Labs   Data Reviewed: I have personally reviewed following labs and imaging studies  CBC: Recent Labs  Lab 11/23/20 0344 11/24/20 0458 11/25/20 0330 11/26/20 0929 11/27/20 0811  WBC 8.7 9.2 9.4 11.5*  --   HGB 9.3* 8.4* 8.6* 9.3* 9.2*  HCT 29.9* 27.4* 27.5* 29.8* 30.1*  MCV 92.0 91.3 92.3 92.8  --   PLT 191 215 234 266  --    Basic Metabolic Panel: Recent Labs  Lab 11/24/20 0458 11/25/20 0330 11/26/20 0929 11/27/20 0811 11/28/20 0548 11/29/20 0830  NA 135 134* 134* 137  135 136  K 3.6 3.6 3.8 4.0 3.5 3.9  CL 96* 94* 96* 97* 96* 95*  CO2 32 34* 31 31 33* 34*  GLUCOSE 117* 119* 150* 125* 109* 109*  BUN 30* 29* 30* 32* 30* 29*  CREATININE 1.20 1.25* 1.04 1.06 0.90 1.04  CALCIUM 8.3* 8.4* 8.7* 8.8* 8.4* 8.8*  MG 1.6* 1.5* 1.8 2.2 2.0  --    GFR: Estimated Creatinine Clearance: 67 mL/min (by C-G formula based on SCr of 1.04 mg/dL). Liver Function Tests: No results for input(s): AST, ALT, ALKPHOS, BILITOT, PROT, ALBUMIN in the last 168 hours. No results for input(s): LIPASE, AMYLASE in the last 168 hours. No results for input(s): AMMONIA in the last 168 hours. Coagulation Profile: No results for input(s): INR, PROTIME in the last 168 hours. Cardiac Enzymes: No results for input(s): CKTOTAL, CKMB, CKMBINDEX, TROPONINI in the last 168 hours. BNP (last 3 results) No results for input(s): PROBNP in the last 8760 hours. HbA1C: No results for input(s): HGBA1C in the last 72 hours. CBG: No results for input(s): GLUCAP in the last 168 hours.  Lipid Profile: No results for input(s): CHOL, HDL, LDLCALC, TRIG, CHOLHDL, LDLDIRECT in the last 72 hours. Thyroid Function Tests: No results for input(s): TSH, T4TOTAL, FREET4, T3FREE, THYROIDAB in the last 72 hours. Anemia Panel: No results for input(s): VITAMINB12, FOLATE, FERRITIN, TIBC, IRON, RETICCTPCT in the last 72 hours. Sepsis Labs: No results for input(s): PROCALCITON, LATICACIDVEN in the last 168 hours.  Recent Results (from the past 240 hour(s))  CULTURE, BLOOD (ROUTINE X 2) w Reflex to ID Panel     Status: None   Collection Time: 11/20/20  8:44 AM   Specimen: BLOOD  Result Value Ref Range Status   Specimen Description BLOOD BLOOD RIGHT WRIST  Final   Special Requests   Final    BOTTLES DRAWN AEROBIC AND ANAEROBIC Blood Culture adequate volume   Culture   Final    NO GROWTH 5 DAYS Performed at [facility], [address] [place], [place] [address]    Report Status 11/25/2020 FINAL   Final  CULTURE, BLOOD (ROUTINE X 2) w Reflex to ID Panel     Status: None   Collection Time: 11/20/20  8:44 AM   Specimen: BLOOD  Result Value Ref Range Status   Specimen Description BLOOD BLOOD LEFT HAND  Final   Special Requests   Final  BOTTLES DRAWN AEROBIC AND ANAEROBIC Blood Culture adequate volume   Culture   Final    NO GROWTH 5 DAYS Performed at [facility], [address] [place], [place] [address]    Report Status 11/25/2020 FINAL  Final  Urine Culture     Status: Abnormal   Collection Time: 11/20/20  2:09 PM   Specimen: Urine, Random  Result Value Ref Range Status   Specimen Description   Final    URINE, RANDOM Performed at [facility], [address] [place], [place] [address]    Special Requests   Final    NONE Performed at [facility], [address] [place],  [address]    Culture (A)  Final    >=100,000 COLONIES/mL ESCHERICHIA COLI Confirmed Extended Spectrum Beta-Lactamase Producer (ESBL).  In bloodstream infections from ESBL organisms, carbapenems are preferred over piperacillin/tazobactam. They are shown to have a lower risk of mortality.    Report Status 11/23/2020 FINAL  Final   Organism ID, Bacteria ESCHERICHIA COLI (A)  Final      Susceptibility   Escherichia coli - MIC*    AMPICILLIN >=32 RESISTANT Resistant     CEFAZOLIN >=64 RESISTANT Resistant     CEFEPIME 16 RESISTANT Resistant     CEFTRIAXONE >=64 RESISTANT Resistant     CIPROFLOXACIN >=4 RESISTANT Resistant     GENTAMICIN <=1 SENSITIVE Sensitive     IMIPENEM <=0.25 SENSITIVE Sensitive     NITROFURANTOIN <=16 SENSITIVE Sensitive     TRIMETH/SULFA >=320 RESISTANT Resistant     AMPICILLIN/SULBACTAM 4 SENSITIVE Sensitive     PIP/TAZO <=4 SENSITIVE Sensitive     * >=100,000 COLONIES/mL ESCHERICHIA COLI      Imaging Studies   No results found.   Medications   Scheduled Meds:  (feeding supplement) PROSource Plus  30 mL Oral TID BM   sodium  chloride   Intravenous Once   apixaban  5 mg Oral BID   aspirin EC  81 mg Oral Daily   atorvastatin  40 mg Oral QPC supper   Chlorhexidine Gluconate Cloth  6 each Topical Daily   feeding supplement  1 Container Oral TID BM   ferrous gluconate  324 mg Oral QPC supper   furosemide  40 mg Intravenous BID   guaiFENesin  15 mL Oral QID   isosorbide mononitrate  15 mg Oral Daily   mometasone-formoterol  2 puff Inhalation BID   multivitamin with minerals  1 tablet Oral Daily   pantoprazole  40 mg Oral BID   polyethylene glycol  17 g Oral Daily   senna-docusate  1 tablet Oral BID   sodium chloride flush  10-40 mL Intracatheter Q12H   sodium chloride flush  3 mL Intravenous Q12H   tamsulosin  0.4 mg Oral Daily   Continuous Infusions:  sodium chloride     promethazine (PHENERGAN) injection (IM or IVPB)         LOS: 18 days    Time spent: 25 minutes with > 50% spent at bedside and in coordination of care     Ezekiel Slocumb, DO [facility]  11/29/2020, 4:17 PM      If 7PM-7AM, please contact night-coverage. How to contact the [facility] Attending or Consulting provider [place] or covering provider during after hours [place], for this patient?    Check the care team in [facility] and look for a) attending/consulting [facility] provider listed and b) the [facility] team listed Log into [email or web address] and use  [place] universal password to access. If you do not have the password, please contact the hospital operator. Locate the [facility] provider you are looking for under [facility] and page to a number that you can be directly reached. If you still have difficulty reaching the provider, please page the [facility] (Director on Call) for the Hospitalists listed on amion for assistance.

## 2020-11-30 DIAGNOSIS — I5033 Acute on chronic diastolic (congestive) heart failure: Secondary | ICD-10-CM | POA: Diagnosis not present

## 2020-11-30 LAB — BASIC METABOLIC PANEL
Anion gap: 7 (ref 5–15)
BUN: 23 mg/dL (ref 8–23)
CO2: 34 mmol/L — ABNORMAL HIGH (ref 22–32)
Calcium: 8.8 mg/dL — ABNORMAL LOW (ref 8.9–10.3)
Chloride: 94 mmol/L — ABNORMAL LOW (ref 98–111)
Creatinine, Ser: 0.95 mg/dL (ref 0.61–1.24)
GFR, Estimated: >60 mL/min (ref >60)
Glucose, Bld: 113 mg/dL — ABNORMAL HIGH (ref 70–99)
Potassium: 3.8 mmol/L (ref 3.5–5.1)
Sodium: 135 mmol/L (ref 135–145)

## 2020-11-30 MED ORDER — POTASSIUM CHLORIDE CRYS ER 20 MEQ PO TBCR
40.0000 meq | EXTENDED_RELEASE_TABLET | Freq: Every day | ORAL | Status: AC
  Administered 2020-11-30 – 2020-12-01 (×2): 40 meq via ORAL
  Filled 2020-11-30 (×2): qty 4

## 2020-11-30 MED ORDER — AMOXICILLIN-POT CLAVULANATE 875-125 MG PO TABS
1.0000 | ORAL_TABLET | Freq: Two times a day (BID) | ORAL | Status: AC
  Administered 2020-11-30 – 2020-12-02 (×5): 1 via ORAL
  Filled 2020-11-30 (×5): qty 1

## 2020-11-30 MED ORDER — TRAZODONE HCL 50 MG PO TABS
25.0000 mg | ORAL_TABLET | Freq: Every day | ORAL | Status: DC
  Administered 2020-11-30 – 2020-12-01 (×2): 25 mg via ORAL
  Filled 2020-11-30 (×2): qty 1

## 2020-11-30 NOTE — Progress Notes (Signed)
Physical Therapy Treatment Patient Details Name: [name] Browning MRN: [id number] DOB: Dec 27, 1940 Today's Date: 11/30/2020   History of Present Illness [name] Browning is a 80 y.o. male with a past medical history of anxiety, CAD, COPD on 2 L of oxygen, prior CVA, CHF, hypertension, hyperlipidemia, presents to the emergency department for shortness of breath.  According to the patient he was recently discharged from the hospital 10/28 for shortness of breath due to CHF exacerbation. ICU stay since 11/05 for treatment of hypotension, hypoxemia, STEMI,  possible GI bleed.    PT Comments    OOB and completes lap on small pod with RW and supervision.  Overall does well but fatigue is noted from distance.  Remains in recliner after session with needs met.  Recommendations for follow up therapy are one component of a multi-disciplinary discharge planning process, led by the attending physician.  Recommendations may be updated based on patient status, additional functional criteria and insurance authorization.  Follow Up Recommendations  Home health PT     Assistance Recommended at Discharge Frequent or constant Supervision/Assistance  Equipment Recommendations  Rolling walker (2 wheels)    Recommendations for Other Services       Precautions / Restrictions Precautions Precautions: Fall Restrictions Weight Bearing Restrictions: No     Mobility  Bed Mobility Overal bed mobility: Independent                  Transfers Overall transfer level: Modified independent Equipment used: Rolling walker (2 wheels) Transfers: Sit to/from Stand                  Ambulation/Gait Ambulation/Gait assistance: Supervision Gait Distance (Feet): 125 Feet Assistive device: Rolling walker (2 wheels) Gait Pattern/deviations: Step-to pattern;Trunk flexed;Narrow base of support Gait velocity: increased     General Gait Details: completed loop with some fatigue.  frequent standing rests where he  flex/extends B knees   Stairs             Wheelchair Mobility    Modified Rankin (Stroke Patients Only)       Balance Overall balance assessment: Modified Independent                                          Cognition Arousal/Alertness: Awake/alert Behavior During Therapy: WFL for tasks assessed/performed Overall Cognitive Status: Within Functional Limits for tasks assessed                                          Exercises Other Exercises Other Exercises: demo and education for HEP sitting    General Comments        Pertinent Vitals/Pain Pain Assessment: No/denies pain    Home Living                          Prior Function            PT Goals (current goals can now be found in the care plan section) Progress towards PT goals: Progressing toward goals    Frequency    Min 2X/week      PT Plan Current plan remains appropriate    Co-evaluation              AM-PAC PT "6 Clicks" Mobility  Outcome Measure  Help needed turning from your back to your side while in a flat bed without using bedrails?: None Help needed moving from lying on your back to sitting on the side of a flat bed without using bedrails?: None Help needed moving to and from a bed to a chair (including a wheelchair)?: A Little Help needed standing up from a chair using your arms (e.g., wheelchair or bedside chair)?: None Help needed to walk in hospital room?: A Little Help needed climbing 3-5 steps with a railing? : A Lot 6 Click Score: 20    End of Session Equipment Utilized During Treatment: Oxygen;Gait belt Activity Tolerance: Patient tolerated treatment well;Patient limited by fatigue Patient left: in chair;with chair alarm set;with call bell/phone within reach Nurse Communication: Mobility status PT Visit Diagnosis: Unsteadiness on feet (R26.81);Other abnormalities of gait and mobility (R26.89);Muscle weakness (generalized)  (M62.81);Difficulty in walking, not elsewhere classified (R26.2)     Time: 4935-5217 PT Time Calculation (min) (ACUTE ONLY): 16 min  Charges:  $Gait Training: 8-22 mins                    Chesley Noon, PTA 11/30/20, 9:38 AM

## 2020-11-30 NOTE — Plan of Care (Signed)

## 2020-11-30 NOTE — Progress Notes (Signed)
PROGRESS NOTE    [name] Browning   QPR:[id number]  DOB: 10-16-1940  PCP: [place]    DOA: 11/11/2020 LOS: 85    Brief Narrative / Hospital Course to Date:   [name] Browning is a 80 year old male with history of CAD (nonobstructive calcified coronary disease), atrial fibrillation on Eliquis, HFpEF, hypertension, TIA in 2016, COPD on 2 L who was admitted to the hospital on 11/2 with shortness of breath.   He has been admitted to the hospital 2 times in the last month.   First admission from 10/20/2020 to 10/31/2020 for respiratory failure due to COPD and pneumonia, as well as an obstructive left renal stone.  He developed hypotension and worsening respiratory distress requiring pressors and BiPAP.  Blood cultures grew Enterococcus faecalis and Staph hemolyticus, treated with antibiotics ending 11/03/2020.  Then he was readmitted from 10/25-10/28 with complaints of dysphagia.  He was seen by gastroenterology, underwent an EGD with no significant findings.  Since that time, he has had progressive worsening shortness of breath, eventually presented to the emergency department where he was found to have an oxygen saturation of 88% on his home 2 L of oxygen.   Assessment & Plan   Principal Problem:   Acute on chronic diastolic CHF (congestive heart failure) (HCC) Active Problems:   HTN (hypertension)   COPD, moderate (HCC)   Acute on chronic respiratory failure with hypoxia (HCC)   Atrial fibrillation (HCC)   DVT (deep venous thrombosis) (HCC)   HLD (hyperlipidemia)   Stroke (HCC)   CAD (coronary artery disease)   NSTEMI (non-ST elevated myocardial infarction) (HCC)   Hypokalemia   Thrombocytopenia (HCC)   Iron deficiency anemia   GI bleeding   Acute ST elevation myocardial infarction (STEMI) of anterior wall (HCC)   Cardiogenic shock (HCC)   Hemorrhagic shock (HCC)   Acute on chronic diastolic CHF Acute on chronic right heart failure Echo 11/6: LVEF 60-65%, LV regional wall  motion abnormalities, Grade 1 diastolic dysfunction, RV systolic function severely reduced with RV severe enlargement, LA + RA severely dilated Pt developed 2-3+ pitting edema of BLE's and scrotal swelling. Started IV diuresis on 11/13 Net IO Since Admission: -13,998.57 mL [11/30/20 1421] 11/20: stable renal function, continues to have very good UO, put out 4350 cc yesterday 11/21: Patient put out 5 L yesterday, still diuresing very well with stable renal function --Cardiology following, Dr. Erin Browning - see recommendations --Continue Lasix 40 mg IV BID --Give IV albumin if BP is soft prior to Lasix --Renal function stable, likely continue IV diuresis until objective evidence of dry, do not anticipate full resolution of his peripheral edema --Strict I/O & daily weights -- TED hose on BLE's --Elevate lower extremities  Recurrent chest pain, likely anginal - resolved Brief episodes, responsive to SL nitro.   Started on Imdur 15 mg by cardiology.   No chest pain since Imdur started. --Continue Imdur 15 mg daily   Bradycardia, intermittent - Due to mild sick sinus syndrome. Not symptomatic at this time. Episodes occur mainly in the setting of vagal maneuvers including urination and defecation. --avoid beta-blocker, per cardiology   Acute on chronic hypoxic respiratory failure - on 2 L/min at baseline.  Now stable, back on baseline oxygen. --Supplemental O2 to keep sats between 88-92%   Acute Anterior STEMI  Hx of Ischemic Cardiomyopathy CODE STEMI was called on 11/5 with ST elevations V2,V3.   Cardiac catheterization showed no significant obstruction in the LAD with occluded RCA and collaterals from  LAD to RCA.  It appeared that RCA was chronically occluded. No intervention. Treated with heparin gtt, now off. --Continue ASA and statin --Hold beta blocker due to hypotension and intermittent bradycardia --Continue Imdur 15 mg daily (new)   Hypomagnesemia -resolved after replacement.    --Monitor replace as needed  CARDIOGENIC SHOCK, resolved Hypotension, improved Weaned off Levophed and dopamine as of morning of 11/9 Was on midodrine while diuresing, now stopped per cardiology   Anemia, acute on chronic Iron deficiency versus ? Acute blood loss secondary to GI bleed Presented with dark-colored stools and stool occult positive, drop in hgb which improved after pRBC transfusion.  --GI consulted -- no plan for endoscopy at this time --s/p 2 units pRBC's --cont oral PPI   ACUTE KIDNEY INJURY -resolved.  Multifactorial due to dehydration, IV contrast.   Renal function improved with gentle IVF hydration. --monitor renal function with diuresis   COPD - stable, no wheezing, on baseline 2 L/min O2 Continue bronchodilators   Dysuria - started on ceftriaxone by night team.  Urine cx grew E coli.   Completed 3 days of ceftriaxone. --11/21: Dysuria reported.  No fevers or chills.  No leukocytosis. --Started Augmentin based on culture from earlier this admission  BPH -continue home Flomax --1121: Patient reports difficulty initiating stream, stream stopping and starting, sensation of incomplete bladder emptying --Monitor with bladder scans and check postvoid residuals    Patient BMI: Body mass index is 24.48 kg/m.   DVT prophylaxis: Place and maintain sequential compression device Start: 11/11/20 1656 apixaban (ELIQUIS) tablet 5 mg   Diet:  Diet Orders (From admission, onward)     Start     Ordered   11/30/20 0849  DIET DYS 2 Room service appropriate? Yes; Fluid consistency: Thin; Fluid restriction: 1500 mL Fluid  Diet effective now       Question Answer Comment  Room service appropriate? Yes   Fluid consistency: Thin   Fluid restriction: 1500 mL Fluid      11/30/20 0848              Code Status: Full Code   Subjective 11/30/20    Patient reports having some dysuria and his urine stream is difficult to initiate and starts and stops once is following.   Denies fevers or chills.  Reports history of kidney stones in the past requiring surgery.  Dates he did not sleep well but was given some trazodone which helped and asked to have this again tonight.  No other acute complaints.  He was talking to his daughter on the phone so I updated her during the encounter.   Disposition Plan & Communication   Status is: Inpatient  Remains inpatient appropriate because: Requires further IV diuresis, discharge home pending optimized volume status and cardiology clearance.  Family communication: Daughter, Baker Janus, updated by phone during encounter today 11/21  Consults, Procedures, Significant Events   Consultants:  Cardiology, Dr. Nehemiah Massed  Procedures:  None  Antimicrobials:  Anti-infectives (From admission, onward)    Start     Dose/Rate Route Frequency Ordered Stop   11/30/20 1130  amoxicillin-clavulanate (AUGMENTIN) 875-125 MG per tablet 1 tablet        1 tablet Oral Every 12 hours 11/30/20 1032 12/02/20 2159   11/21/20 1000  cefTRIAXone (ROCEPHIN) 1 g in sodium chloride 0.9 % 100 mL IVPB  Status:  Discontinued        1 g 200 mL/hr over 30 Minutes Intravenous Every 24 hours 11/20/20 1351 11/22/20 1908  11/20/20 1000  cefTRIAXone (ROCEPHIN) 1 g in sodium chloride 0.9 % 100 mL IVPB  Status:  Discontinued        1 g 200 mL/hr over 30 Minutes Intravenous Every 12 hours 11/20/20 0614 11/20/20 1351         Micro    Objective   Vitals:   11/30/20 0436 11/30/20 0443 11/30/20 0735 11/30/20 1059  BP: 106/63  116/64 102/67  Pulse: 76  87 85  Resp: 20  18 18   Temp: 98.1 F (36.7 C)  98.6 F (37 C) 98 F (36.7 C)  TempSrc:      SpO2: 95%  96% 99%  Weight:  86.5 kg    Height:        Intake/Output Summary (Last 24 hours) at 11/30/2020 1421 Last data filed at 11/30/2020 1308 Gross per 24 hour  Intake 1110 ml  Output 4600 ml  Net -3490 ml   Filed Weights   11/28/20 0417 11/29/20 0100 11/30/20 0443  Weight: 89.5 kg 88.5 kg 86.5 kg     Physical Exam:  General exam: Awake and alert, sitting in recliner, no acute distress Respiratory system: Lungs clear bilaterally, on 2 L/min nasal cannula O2, normal respiratory effort. Cardiovascular system: Regular rate and rhythm, improved but persistent lower extremity pitting edema with TED hose in place Gastrointestinal abdomen soft and nontender Central nervous system: A&O x3. no gross focal neurologic deficits, normal speech Psychiatry: normal mood, congruent affect, judgement and insight appear normal  Labs   Data Reviewed: I have personally reviewed following labs and imaging studies  CBC: Recent Labs  Lab 11/24/20 0458 11/25/20 0330 11/26/20 0929 11/27/20 0811  WBC 9.2 9.4 11.5*  --   HGB 8.4* 8.6* 9.3* 9.2*  HCT 27.4* 27.5* 29.8* 30.1*  MCV 91.3 92.3 92.8  --   PLT 215 234 266  --    Basic Metabolic Panel: Recent Labs  Lab 11/24/20 0458 11/25/20 0330 11/26/20 0929 11/27/20 0811 11/28/20 0548 11/29/20 0830 11/30/20 0755  NA 135 134* 134* 137 135 136 135  K 3.6 3.6 3.8 4.0 3.5 3.9 3.8  CL 96* 94* 96* 97* 96* 95* 94*  CO2 32 34* 31 31 33* 34* 34*  GLUCOSE 117* 119* 150* 125* 109* 109* 113*  BUN 30* 29* 30* 32* 30* 29* 23  CREATININE 1.20 1.25* 1.04 1.06 0.90 1.04 0.95  CALCIUM 8.3* 8.4* 8.7* 8.8* 8.4* 8.8* 8.8*  MG 1.6* 1.5* 1.8 2.2 2.0  --   --    GFR: Estimated Creatinine Clearance: 73.3 mL/min (by C-G formula based on SCr of 0.95 mg/dL). Liver Function Tests: No results for input(s): AST, ALT, ALKPHOS, BILITOT, PROT, ALBUMIN in the last 168 hours. No results for input(s): LIPASE, AMYLASE in the last 168 hours. No results for input(s): AMMONIA in the last 168 hours. Coagulation Profile: No results for input(s): INR, PROTIME in the last 168 hours. Cardiac Enzymes: No results for input(s): CKTOTAL, CKMB, CKMBINDEX, TROPONINI in the last 168 hours. BNP (last 3 results) No results for input(s): PROBNP in the last 8760 hours. HbA1C: No results  for input(s): HGBA1C in the last 72 hours. CBG: No results for input(s): GLUCAP in the last 168 hours.  Lipid Profile: No results for input(s): CHOL, HDL, LDLCALC, TRIG, CHOLHDL, LDLDIRECT in the last 72 hours. Thyroid Function Tests: No results for input(s): TSH, T4TOTAL, FREET4, T3FREE, THYROIDAB in the last 72 hours. Anemia Panel: No results for input(s): VITAMINB12, FOLATE, FERRITIN, TIBC, IRON, RETICCTPCT in the last 72  hours. Sepsis Labs: No results for input(s): PROCALCITON, LATICACIDVEN in the last 168 hours.  No results found for this or any previous visit (from the past 240 hour(s)).     Imaging Studies   No results found.   Medications   Scheduled Meds:  (feeding supplement) PROSource Plus  30 mL Oral TID BM   sodium chloride   Intravenous Once   amoxicillin-clavulanate  1 tablet Oral Q12H   apixaban  5 mg Oral BID   aspirin EC  81 mg Oral Daily   atorvastatin  40 mg Oral QPC supper   Chlorhexidine Gluconate Cloth  6 each Topical Daily   feeding supplement  1 Container Oral TID BM   ferrous gluconate  324 mg Oral QPC supper   furosemide  40 mg Intravenous BID   guaiFENesin  15 mL Oral QID   isosorbide mononitrate  15 mg Oral Daily   mometasone-formoterol  2 puff Inhalation BID   multivitamin with minerals  1 tablet Oral Daily   pantoprazole  40 mg Oral BID   polyethylene glycol  17 g Oral Daily   potassium chloride  40 mEq Oral Q breakfast   senna-docusate  1 tablet Oral BID   sodium chloride flush  10-40 mL Intracatheter Q12H   sodium chloride flush  3 mL Intravenous Q12H   tamsulosin  0.4 mg Oral Daily   traZODone  25 mg Oral QHS   Continuous Infusions:  sodium chloride     promethazine (PHENERGAN) injection (IM or IVPB)         LOS: 19 days    Time spent: 30 minutes with > 50% spent at bedside and in coordination of care     Ezekiel Slocumb, DO [facility]  11/30/2020, 2:21 PM      If 7PM-7AM, please contact  night-coverage. How to contact the [facility] Attending or Consulting provider [place] or covering provider during after hours [place], for this patient?    Check the care team in [facility] and look for a) attending/consulting [facility] provider listed and b) the [facility] team listed Log into [email or web address] and use [place] universal password to access. If you do not have the password, please contact the hospital operator. Locate the [facility] provider you are looking for under [facility] and page to a number that you can be directly reached. If you still have difficulty reaching the provider, please page the [facility] (Director on Call) for the Hospitalists listed on amion for assistance.

## 2020-12-01 DIAGNOSIS — I5033 Acute on chronic diastolic (congestive) heart failure: Secondary | ICD-10-CM | POA: Diagnosis not present

## 2020-12-01 LAB — BASIC METABOLIC PANEL
Anion gap: 5 (ref 5–15)
BUN: 26 mg/dL — ABNORMAL HIGH (ref 8–23)
CO2: 33 mmol/L — ABNORMAL HIGH (ref 22–32)
Calcium: 8.5 mg/dL — ABNORMAL LOW (ref 8.9–10.3)
Chloride: 96 mmol/L — ABNORMAL LOW (ref 98–111)
Creatinine, Ser: 1.01 mg/dL (ref 0.61–1.24)
GFR, Estimated: >60 mL/min (ref >60)
Glucose, Bld: 117 mg/dL — ABNORMAL HIGH (ref 70–99)
Potassium: 4 mmol/L (ref 3.5–5.1)
Sodium: 134 mmol/L — ABNORMAL LOW (ref 135–145)

## 2020-12-01 LAB — MAGNESIUM: Magnesium: 1.9 mg/dL (ref 1.7–2.4)

## 2020-12-01 LAB — HEMOGLOBIN AND HEMATOCRIT, BLOOD
HCT: 26.5 % — ABNORMAL LOW (ref 39.0–52.0)
Hemoglobin: 8.2 g/dL — ABNORMAL LOW (ref 13.0–17.0)

## 2020-12-01 MED ORDER — FUROSEMIDE 40 MG PO TABS
80.0000 mg | ORAL_TABLET | Freq: Two times a day (BID) | ORAL | Status: DC
  Administered 2020-12-01 – 2020-12-02 (×2): 80 mg via ORAL
  Filled 2020-12-01 (×2): qty 2

## 2020-12-01 NOTE — Progress Notes (Signed)
Nutrition Follow-up  DOCUMENTATION CODES:   Not applicable  INTERVENTION:   -Continue dysphagia 2 diet with 1.5 L fluid restriction -Continue MVI with minerals daily  NUTRITION DIAGNOSIS:   Inadequate oral intake related to nausea, decreased appetite as evidenced by per patient/family report.  Progressing   GOAL:   Patient will meet greater than or equal to 90% of their needs  Progressing   MONITOR:   PO intake, Supplement acceptance, Labs, Weight trends, Skin, I & O's  REASON FOR ASSESSMENT:   Rounds    ASSESSMENT:   [name] Browning is a 80 y.o. male with medical history significant of dCHF,  HLD, HLD, DVT and A fib on Eliquis, COPD on 2 L oxygen, stroke, TIA, GERD, anxiety, CAD, carotid artery stenosis, kidney stone, syncope, iron deficiency anemia, who presents with shortness of breath.  Reviewed I/O's: -1.9 L x 24 hours and -14.7 L since 11/17/20  UOP: 3 L x 24 hours   Spoke with pt over phone, who reports feeling very well today. He shares that he has a good appetite and appreciates the dysphagia 2 diet consistency. He has consumed all of his breakfast this morning. Noted meal completions 70-100%.   Noted 7.1% wt loss over the past week, secondary to diuresis (-14.7 L since 11/17/20). Per MD notes, awaiting optimization of diuresis prior to discharge.   Discussed importance of good meal intake to promote healing.   Medications reviewed and include lasix and senokot.   Labs reviewed: Na: 134.    Diet Order:   Diet Order             DIET DYS 2 Room service appropriate? Yes; Fluid consistency: Thin; Fluid restriction: 1500 mL Fluid  Diet effective now                   EDUCATION NEEDS:   Education needs have been addressed  Skin:  Skin Assessment: Skin Integrity Issues: Skin Integrity Issues:: Incisions Incisions: closed perineum  Last BM:  11/30/20  Height:   Ht Readings from Last 1 Encounters:  11/21/20 6\' 2"  (1.88 m)    Weight:   Wt  Readings from Last 1 Encounters:  12/01/20 85.4 kg    Ideal Body Weight:  86.4 kg  BMI:  Body mass index is 24.17 kg/m.  Estimated Nutritional Needs:   Kcal:  2150-2350  Protein:  110-125 grams  Fluid:  > 2 L    Loistine Chance, RD, LDN, [place] Registered Dietitian II Certified Diabetes Care and Education Specialist Please refer to [facility] for RD and/or RD on-call/weekend/after hours pager

## 2020-12-01 NOTE — Progress Notes (Signed)
PROGRESS NOTE    [name] Browning   KCL:[id number]  DOB: [birth year]  PCP: [facility]    DOA: 11/11/2020 LOS: 56    Brief Narrative / Hospital Course to Date:   [name] Browning is a 80 year old male with history of CAD (nonobstructive calcified coronary disease), atrial fibrillation on Eliquis, HFpEF, hypertension, TIA in 2016, COPD on 2 L who was admitted to the hospital on 11/2 with shortness of breath.   He has been admitted to the hospital 2 times in the last month.   First admission from 10/20/2020 to 10/31/2020 for respiratory failure due to COPD and pneumonia, as well as an obstructive left renal stone.  He developed hypotension and worsening respiratory distress requiring pressors and BiPAP.  Blood cultures grew Enterococcus faecalis and Staph hemolyticus, treated with antibiotics ending 11/03/2020.  Then he was readmitted from 10/25-10/28 with complaints of dysphagia.  He was seen by gastroenterology, underwent an EGD with no significant findings.  Since that time, he has had progressive worsening shortness of breath, eventually presented to the emergency department where he was found to have an oxygen saturation of 88% on his home 2 L of oxygen.   Assessment & Plan   Principal Problem:   Acute on chronic diastolic CHF (congestive heart failure) (HCC) Active Problems:   HTN (hypertension)   COPD, moderate (HCC)   Acute on chronic respiratory failure with hypoxia (HCC)   Atrial fibrillation (HCC)   DVT (deep venous thrombosis) (HCC)   HLD (hyperlipidemia)   Stroke (HCC)   CAD (coronary artery disease)   NSTEMI (non-ST elevated myocardial infarction) (HCC)   Hypokalemia   Thrombocytopenia (HCC)   Iron deficiency anemia   GI bleeding   Acute ST elevation myocardial infarction (STEMI) of anterior wall (HCC)   Cardiogenic shock (HCC)   Hemorrhagic shock (HCC)   Acute on chronic diastolic CHF Acute on chronic right heart failure Echo 11/6: LVEF 60-65%, LV regional wall  motion abnormalities, Grade 1 diastolic dysfunction, RV systolic function severely reduced with RV severe enlargement, LA + RA severely dilated Pt developed 2-3+ pitting edema of BLE's and scrotal swelling. Started IV diuresis on 11/13 Net IO Since Admission: -16,368.57 mL [12/01/20 1407] 11/20: stable renal function, continues to have very good UO, put out 4350 cc yesterday 11/21: Patient put out 5 L yesterday, still diuresing very well with stable renal function 11/22: 3 L urine output yesterday, stable renal function, lower extremity edema much better. Treated for several days with Lasix IV 40 mg twice daily. --Cardiology was following, Dr. Erin Fulling  --Transition to oral Lasix 80 mg BID, equivalent to IV dose until objective evidence of optimized volume status, do not anticipate full resolution of his peripheral edema --Renal function stable, monitor --Strict I/O & daily weights -- TED hose on BLE's --Elevate lower extremities  Recurrent chest pain, likely anginal - resolved Brief episodes, responsive to SL nitro.   Started on Imdur 15 mg by cardiology.   No chest pain since Imdur started. --Continue Imdur 15 mg daily   Bradycardia, intermittent - Due to mild sick sinus syndrome. Not symptomatic at this time. Episodes occur mainly in the setting of vagal maneuvers including urination and defecation. --avoid beta-blocker, per cardiology   Acute on chronic hypoxic respiratory failure - on 2 L/min at baseline.  Now stable, back on baseline oxygen. --Supplemental O2 to keep sats between 88-92%   Acute Anterior STEMI  Hx of Ischemic Cardiomyopathy CODE STEMI was called on 11/5 with ST elevations  V2,V3.   Cardiac catheterization showed no significant obstruction in the LAD with occluded RCA and collaterals from LAD to RCA.  It appeared that RCA was chronically occluded. No intervention. Treated with heparin gtt, now off. --Continue ASA and statin --Hold beta blocker due to  hypotension and intermittent bradycardia --Continue Imdur 15 mg daily (new)   Hypomagnesemia -resolved after replacement.   --Monitor replace as needed  CARDIOGENIC SHOCK, resolved Hypotension, improved Weaned off Levophed and dopamine as of morning of 11/9 Was on midodrine while diuresing, now stopped per cardiology   Anemia, acute on chronic Iron deficiency versus ? Acute blood loss secondary to GI bleed Presented with dark-colored stools and stool occult positive, drop in hgb which improved after pRBC transfusion.  --GI consulted -- no plan for endoscopy at this time --s/p 2 units pRBC's --cont oral PPI   ACUTE KIDNEY INJURY -resolved.  Multifactorial due to dehydration, IV contrast.   Renal function improved with gentle IVF hydration. --monitor renal function with diuresis   COPD - stable, no wheezing, on baseline 2 L/min O2 Continue bronchodilators   Dysuria - started on ceftriaxone by night team.  Urine cx grew E coli.   Completed 3 days of ceftriaxone. --11/21: Dysuria reported.  No fevers or chills.  No leukocytosis.  No hematuria. --Started Augmentin based on culture from earlier this admission  BPH -continue home Flomax --11/21: Patient reports difficulty initiating stream, stream stopping and starting, sensation of incomplete bladder emptying --Monitor with bladder scans and check postvoid residuals    Patient BMI: Body mass index is 24.17 kg/m.   DVT prophylaxis: Place and maintain sequential compression device Start: 11/11/20 1656 apixaban (ELIQUIS) tablet 5 mg   Diet:  Diet Orders (From admission, onward)     Start     Ordered   11/30/20 0849  DIET DYS 2 Room service appropriate? Yes; Fluid consistency: Thin; Fluid restriction: 1500 mL Fluid  Diet effective now       Question Answer Comment  Room service appropriate? Yes   Fluid consistency: Thin   Fluid restriction: 1500 mL Fluid      11/30/20 0848              Code Status: Full Code    Subjective 12/01/20    Patient seen up in recliner after ambulating 2 laps around the unit with PT this morning.  Reports he got quite fatigued during the second lap.  PT reported his O2 sat dropped to 81% on his usual 2 L but quickly recovered into the 90s at rest.  Patient reports feeling more weak than at baseline but overall felt well.  Says still has intermittent dysuria with voiding no fevers chills or hematuria seen.  He denies any other acute complaints.  No acute events reported.   Disposition Plan & Communication   Status is: Inpatient  Remains inpatient appropriate because: Remains volume overloaded, closely monitor volume status with transition to oral diuretic today.  Possible discharge tomorrow.  Family communication: Daughter, Baker Janus, updated by phone during encounter 11/21  Consults, Procedures, Significant Events   Consultants:  Cardiology, Dr. Nehemiah Massed  Procedures:  None  Antimicrobials:  Anti-infectives (From admission, onward)    Start     Dose/Rate Route Frequency Ordered Stop   11/30/20 1130  amoxicillin-clavulanate (AUGMENTIN) 875-125 MG per tablet 1 tablet        1 tablet Oral Every 12 hours 11/30/20 1032 12/02/20 2159   11/21/20 1000  cefTRIAXone (ROCEPHIN) 1 g in sodium chloride 0.9 %  100 mL IVPB  Status:  Discontinued        1 g 200 mL/hr over 30 Minutes Intravenous Every 24 hours 11/20/20 1351 11/22/20 1908   11/20/20 1000  cefTRIAXone (ROCEPHIN) 1 g in sodium chloride 0.9 % 100 mL IVPB  Status:  Discontinued        1 g 200 mL/hr over 30 Minutes Intravenous Every 12 hours 11/20/20 0614 11/20/20 1351         Micro    Objective   Vitals:   12/01/20 0436 12/01/20 0555 12/01/20 0741 12/01/20 1127  BP: 117/63  108/67 105/67  Pulse: 80  74 86  Resp: 14     Temp: 98.2 F (36.8 C)  98 F (36.7 C) 98.2 F (36.8 C)  TempSrc:   Oral Oral  SpO2: 95%  93% 96%  Weight:  85.4 kg    Height:        Intake/Output Summary (Last 24 hours) at  12/01/2020 1407 Last data filed at 12/01/2020 4098 Gross per 24 hour  Intake 480 ml  Output 2850 ml  Net -2370 ml   Filed Weights   11/29/20 0100 11/30/20 0443 12/01/20 0555  Weight: 88.5 kg 86.5 kg 85.4 kg    Physical Exam:  General exam: Sitting up in recliner, awake and alert, no acute distress, in good spirits Respiratory system: CTA B, on 2 L/min nasal cannula O2, normal respiratory effort at rest. Cardiovascular system: Regular rate and rhythm, significant improvement in lower extremity edema with TED hose in place Gastrointestinal abdomen soft and nontender Central nervous system: A&O x3. no gross focal neurologic deficits, normal speech Psychiatry: normal mood, congruent affect, judgement and insight appear normal  Labs   Data Reviewed: I have personally reviewed following labs and imaging studies  CBC: Recent Labs  Lab 11/25/20 0330 11/26/20 0929 11/27/20 0811 12/01/20 0300  WBC 9.4 11.5*  --   --   HGB 8.6* 9.3* 9.2* 8.2*  HCT 27.5* 29.8* 30.1* 26.5*  MCV 92.3 92.8  --   --   PLT 234 266  --   --    Basic Metabolic Panel: Recent Labs  Lab 11/25/20 0330 11/26/20 0929 11/27/20 0811 11/28/20 0548 11/29/20 0830 11/30/20 0755 12/01/20 0300  NA 134* 134* 137 135 136 135 134*  K 3.6 3.8 4.0 3.5 3.9 3.8 4.0  CL 94* 96* 97* 96* 95* 94* 96*  CO2 34* 31 31 33* 34* 34* 33*  GLUCOSE 119* 150* 125* 109* 109* 113* 117*  BUN 29* 30* 32* 30* 29* 23 26*  CREATININE 1.25* 1.04 1.06 0.90 1.04 0.95 1.01  CALCIUM 8.4* 8.7* 8.8* 8.4* 8.8* 8.8* 8.5*  MG 1.5* 1.8 2.2 2.0  --   --  1.9   GFR: Estimated Creatinine Clearance: 69 mL/min (by C-G formula based on SCr of 1.01 mg/dL). Liver Function Tests: No results for input(s): AST, ALT, ALKPHOS, BILITOT, PROT, ALBUMIN in the last 168 hours. No results for input(s): LIPASE, AMYLASE in the last 168 hours. No results for input(s): AMMONIA in the last 168 hours. Coagulation Profile: No results for input(s): INR, PROTIME in  the last 168 hours. Cardiac Enzymes: No results for input(s): CKTOTAL, CKMB, CKMBINDEX, TROPONINI in the last 168 hours. BNP (last 3 results) No results for input(s): PROBNP in the last 8760 hours. HbA1C: No results for input(s): HGBA1C in the last 72 hours. CBG: No results for input(s): GLUCAP in the last 168 hours.  Lipid Profile: No results for input(s): CHOL, HDL, LDLCALC,  TRIG, CHOLHDL, LDLDIRECT in the last 72 hours. Thyroid Function Tests: No results for input(s): TSH, T4TOTAL, FREET4, T3FREE, THYROIDAB in the last 72 hours. Anemia Panel: No results for input(s): VITAMINB12, FOLATE, FERRITIN, TIBC, IRON, RETICCTPCT in the last 72 hours. Sepsis Labs: No results for input(s): PROCALCITON, LATICACIDVEN in the last 168 hours.  No results found for this or any previous visit (from the past 240 hour(s)).     Imaging Studies   No results found.   Medications   Scheduled Meds:  sodium chloride   Intravenous Once   amoxicillin-clavulanate  1 tablet Oral Q12H   apixaban  5 mg Oral BID   aspirin EC  81 mg Oral Daily   atorvastatin  40 mg Oral QPC supper   Chlorhexidine Gluconate Cloth  6 each Topical Daily   ferrous gluconate  324 mg Oral QPC supper   furosemide  80 mg Oral BID   guaiFENesin  15 mL Oral QID   isosorbide mononitrate  15 mg Oral Daily   mometasone-formoterol  2 puff Inhalation BID   multivitamin with minerals  1 tablet Oral Daily   pantoprazole  40 mg Oral BID   polyethylene glycol  17 g Oral Daily   senna-docusate  1 tablet Oral BID   sodium chloride flush  10-40 mL Intracatheter Q12H   sodium chloride flush  3 mL Intravenous Q12H   tamsulosin  0.4 mg Oral Daily   traZODone  25 mg Oral QHS   Continuous Infusions:  sodium chloride     promethazine (PHENERGAN) injection (IM or IVPB)         LOS: 20 days    Time spent: 30 minutes with > 50% spent at bedside and in coordination of care     Ezekiel Slocumb, DO [facility]  12/01/2020, 2:07 PM      If 7PM-7AM, please contact night-coverage. How to contact the [facility] Attending or Consulting provider [place] or covering provider during after hours [place], for this patient?    Check the care team in [facility] and look for a) attending/consulting [facility] provider listed and b) the [facility] team listed Log into [email or web address] and use [place]'s universal password to access. If you do not have the password, please contact the hospital operator. Locate the [facility] provider you are looking for under [facility] and page to a number that you can be directly reached. If you still have difficulty reaching the provider, please page the [facility] (Director on Call) for the Hospitalists listed on amion for assistance.

## 2020-12-01 NOTE — Progress Notes (Signed)
Heart Failure Nurse Navigator Note   Met with patient today, he was sitting up in the chair at bedside.  He states that he feels good but he still  feels he is not 100%.  By teach back method discussed daily weights, what to report to physician, signs and symptoms to report.  He states that he has a scale.  Still needs some re enforcement but explained to the patient it takes time.  Pricilla Riffle RN [facility]

## 2020-12-01 NOTE — Progress Notes (Signed)
Physical Therapy Treatment Patient Details Name: [name] Browning MRN: [id number] DOB: [birth year] Today's Date: 12/01/2020   History of Present Illness [name] Browning is a 80 y.o. male with a past medical history of anxiety, CAD, COPD on 2 L of oxygen, prior CVA, CHF, hypertension, hyperlipidemia, presents to the emergency department for shortness of breath.  According to the patient he was recently discharged from the hospital 10/28 for shortness of breath due to CHF exacerbation. ICU stay since 11/05 for treatment of hypotension, hypoxemia, STEMI,  possible GI bleed.    PT Comments    Pt is making good progress towards goals with ability to increased ambulation distance this session. Pt does fatigue with increased endurance. All mobility performed on 2L of O2 with sats decreasing with exertion. Requires cues for PLB to improve sats once seated. Good endurance with seated there-ex. Will continue to progress as able.    Recommendations for follow up therapy are one component of a multi-disciplinary discharge planning process, led by the attending physician.  Recommendations may be updated based on patient status, additional functional criteria and insurance authorization.  Follow Up Recommendations  Home health PT     Assistance Recommended at Discharge Frequent or constant Supervision/Assistance  Equipment Recommendations  Rolling walker (2 wheels)    Recommendations for Other Services       Precautions / Restrictions Precautions Precautions: Fall Restrictions Weight Bearing Restrictions: No     Mobility  Bed Mobility               General bed mobility comments: not performed as received in recliner    Transfers Overall transfer level: Modified independent Equipment used: Rolling walker (2 wheels) Transfers: Sit to/from Stand Sit to Stand: Modified independent (Device/Increase time)           General transfer comment: safe technique with no cues for hand placement     Ambulation/Gait Ambulation/Gait assistance: Supervision Gait Distance (Feet): 220 Feet Assistive device: Rolling walker (2 wheels) Gait Pattern/deviations: Step-through pattern       General Gait Details: ambulated 2 laps around small nursing station. Fatigues with increased distance. All mobility performed on 2L of O2 with sats decreasing to 81%. Once seated, cues for PLB with sats improving to 91%.   Stairs             Wheelchair Mobility    Modified Rankin (Stroke Patients Only)       Balance Overall balance assessment: Needs assistance Sitting-balance support: Bilateral upper extremity supported;Feet supported Sitting balance-Leahy Scale: Good     Standing balance support: Bilateral upper extremity supported Standing balance-Leahy Scale: Fair                              Cognition Arousal/Alertness: Awake/alert Behavior During Therapy: WFL for tasks assessed/performed Overall Cognitive Status: Within Functional Limits for tasks assessed                                 General Comments: A&Ox4. Pleasant throughout.        Exercises Other Exercises Other Exercises: seated ther-ex performed on B LE including toe raises, calf raises, alt marching, and LAQ. 15 reps performed with supervision. Safe technique    General Comments        Pertinent Vitals/Pain Pain Assessment: No/denies pain    Home Living  Prior Function            PT Goals (current goals can now be found in the care plan section) Acute Rehab PT Goals Patient Stated Goal: to go home PT Goal Formulation: With patient/family Time For Goal Achievement: 12/03/20 Potential to Achieve Goals: Fair Progress towards PT goals: Progressing toward goals    Frequency    Min 2X/week      PT Plan Current plan remains appropriate    Co-evaluation              AM-PAC PT "6 Clicks" Mobility   Outcome Measure  Help  needed turning from your back to your side while in a flat bed without using bedrails?: None Help needed moving from lying on your back to sitting on the side of a flat bed without using bedrails?: None Help needed moving to and from a bed to a chair (including a wheelchair)?: A Little Help needed standing up from a chair using your arms (e.g., wheelchair or bedside chair)?: A Little Help needed to walk in hospital room?: A Little Help needed climbing 3-5 steps with a railing? : A Lot 6 Click Score: 19    End of Session Equipment Utilized During Treatment: Oxygen;Gait belt Activity Tolerance: Patient tolerated treatment well Patient left: in chair;with chair alarm set Nurse Communication: Mobility status PT Visit Diagnosis: Unsteadiness on feet (R26.81);Other abnormalities of gait and mobility (R26.89);Muscle weakness (generalized) (M62.81);Difficulty in walking, not elsewhere classified (R26.2)     Time: 7169-6789 PT Time Calculation (min) (ACUTE ONLY): 23 min  Charges:  $Gait Training: 8-22 mins $Therapeutic Exercise: 8-22 mins                     Greggory Stallion, PT, DPT [phone]    Shakena Callari 12/01/2020, 2:39 PM

## 2020-12-02 LAB — HEMOGLOBIN AND HEMATOCRIT, BLOOD
HCT: 26.4 % — ABNORMAL LOW (ref 39.0–52.0)
Hemoglobin: 8.3 g/dL — ABNORMAL LOW (ref 13.0–17.0)

## 2020-12-02 LAB — BASIC METABOLIC PANEL
Anion gap: 5 (ref 5–15)
BUN: 23 mg/dL (ref 8–23)
CO2: 34 mmol/L — ABNORMAL HIGH (ref 22–32)
Calcium: 8.7 mg/dL — ABNORMAL LOW (ref 8.9–10.3)
Chloride: 97 mmol/L — ABNORMAL LOW (ref 98–111)
Creatinine, Ser: 1.04 mg/dL (ref 0.61–1.24)
GFR, Estimated: >60 mL/min (ref >60)
Glucose, Bld: 110 mg/dL — ABNORMAL HIGH (ref 70–99)
Potassium: 3.8 mmol/L (ref 3.5–5.1)
Sodium: 136 mmol/L (ref 135–145)

## 2020-12-02 LAB — MAGNESIUM: Magnesium: 1.8 mg/dL (ref 1.7–2.4)

## 2020-12-02 MED ORDER — DOCUSATE SODIUM 100 MG PO CAPS: 100.0000 mg | ORAL_CAPSULE | Freq: Two times a day (BID) | ORAL | 2 refills | Status: DC

## 2020-12-02 MED ORDER — ISOSORBIDE MONONITRATE ER 30 MG PO TB24: 15.0000 mg | ORAL_TABLET | Freq: Every day | ORAL | 0 refills | Status: DC

## 2020-12-02 MED ORDER — PANTOPRAZOLE SODIUM 40 MG PO TBEC: 40.0000 mg | DELAYED_RELEASE_TABLET | Freq: Two times a day (BID) | ORAL | 0 refills | Status: DC

## 2020-12-02 MED ORDER — FUROSEMIDE 40 MG PO TABS: 40.0000 mg | ORAL_TABLET | Freq: Two times a day (BID) | ORAL | 0 refills | Status: DC

## 2020-12-02 MED ORDER — FUROSEMIDE 80 MG PO TABS: 40.0000 mg | ORAL_TABLET | Freq: Two times a day (BID) | ORAL | 0 refills | Status: DC

## 2020-12-02 MED ORDER — LACTULOSE 10 GM/15ML PO SOLN: 20.0000 g | Freq: Two times a day (BID) | ORAL | 0 refills | Status: DC | PRN

## 2020-12-02 NOTE — Progress Notes (Signed)
This RN provided discharge instructions and teaching to the patient. The patient verbalized and demonstrated understanding of the provided instructions. All outstanding questions resolved. R IJ removed per order. Cannula intact. Pt tolerated well. L arm PIV removed. Cannula intact. Pt tolerated well. All belongings packed and in tow. Daleen Snook, NT transported patient to private vehicle via wheelchair at time of departure.

## 2020-12-02 NOTE — Care Management Important Message (Signed)
Important Message  Patient Details  Name: [name] Browning MRN: [id number] Date of Birth: [birth year]   Medicare Important Message Given:  Yes  Reviewed Medicare IM with patient via room phone due to isolation status.  Confirmed he still has copy of Medicare IM in his room/at home so an additional copy not needed at this time.     Dannette Barbara 12/02/2020, 11:53 AM

## 2020-12-02 NOTE — Progress Notes (Signed)
Physical Therapy Treatment Patient Details Name: [name] Browning MRN: [id number] DOB: 03/08/1940 Today's Date: 12/02/2020   History of Present Illness [name] Browning is a 80 y.o. male with a past medical history of anxiety, CAD, COPD on 2 L of oxygen, prior CVA, CHF, hypertension, hyperlipidemia, presents to the emergency department for shortness of breath.  According to the patient he was recently discharged from the hospital 10/28 for shortness of breath due to CHF exacerbation. ICU stay since 11/05 for treatment of hypotension, hypoxemia, STEMI,  possible GI bleed.    PT Comments    Patient received in bed, he is agreeable to PT session. Hopes to go home today, but no discharge order/summer in chart. Patient is independent with bed mobility, transfers with supervision. He ambulated 200 feet on 2 liters O2 and RW. Patient fatigued with this and O2 saturations drop to low 80%s on 2 liters. He will benefit from continued skilled  PT to improve activity tolerance and strength for safety at home.        Recommendations for follow up therapy are one component of a multi-disciplinary discharge planning process, led by the attending physician.  Recommendations may be updated based on patient status, additional functional criteria and insurance authorization.  Follow Up Recommendations  Home health PT     Assistance Recommended at Discharge Frequent or constant Supervision/Assistance  Equipment Recommendations  Rolling walker (2 wheels)    Recommendations for Other Services       Precautions / Restrictions Precautions Precautions: Fall Restrictions Weight Bearing Restrictions: No     Mobility  Bed Mobility Overal bed mobility: Independent Bed Mobility: Supine to Sit     Supine to sit: Independent          Transfers Overall transfer level: Modified independent Equipment used: Rolling walker (2 wheels) Transfers: Sit to/from Stand Sit to Stand: Modified independent (Device/Increase  time)           General transfer comment: safe technique with no cues for hand placement    Ambulation/Gait Ambulation/Gait assistance: Min guard Gait Distance (Feet): 200 Feet Assistive device: Rolling walker (2 wheels) Gait Pattern/deviations: Step-through pattern Gait velocity: decreased     General Gait Details: ambulated with rw, required a few brief standing rest breaks due to LE fatigue and tightness.  O2 sats decrease to 80% on 2 liters. Increased O2 to 5 liters and he quickly returned to >90%. Left patient in recliner on 2 liters.   Stairs             Wheelchair Mobility    Modified Rankin (Stroke Patients Only)       Balance Overall balance assessment: Modified Independent Sitting-balance support: Feet supported Sitting balance-Leahy Scale: Good     Standing balance support: Bilateral upper extremity supported;During functional activity;Reliant on assistive device for balance Standing balance-Leahy Scale: Fair Standing balance comment: Heavy reliance on RW for support.                            Cognition Arousal/Alertness: Awake/alert Behavior During Therapy: WFL for tasks assessed/performed Overall Cognitive Status: Within Functional Limits for tasks assessed                                 General Comments: A&Ox4. Pleasant throughout.        Exercises      General Comments  Pertinent Vitals/Pain Pain Assessment: No/denies pain Pain Location: B LE fatigue/heaviness Pain Descriptors / Indicators: Tightness Pain Intervention(s): Monitored during session    Home Living                          Prior Function            PT Goals (current goals can now be found in the care plan section) Acute Rehab PT Goals Patient Stated Goal: to go home PT Goal Formulation: With patient Time For Goal Achievement: 12/03/20 Potential to Achieve Goals: Good Progress towards PT goals: Progressing toward  goals    Frequency    Min 2X/week      PT Plan Current plan remains appropriate    Co-evaluation              AM-PAC PT "6 Clicks" Mobility   Outcome Measure  Help needed turning from your back to your side while in a flat bed without using bedrails?: None Help needed moving from lying on your back to sitting on the side of a flat bed without using bedrails?: None Help needed moving to and from a bed to a chair (including a wheelchair)?: A Little Help needed standing up from a chair using your arms (e.g., wheelchair or bedside chair)?: A Little Help needed to walk in hospital room?: A Little Help needed climbing 3-5 steps with a railing? : A Lot 6 Click Score: 19    End of Session Equipment Utilized During Treatment: Gait belt;Oxygen Activity Tolerance: Patient limited by fatigue Patient left: in chair;with chair alarm set Nurse Communication: Mobility status PT Visit Diagnosis: Unsteadiness on feet (R26.81);Other abnormalities of gait and mobility (R26.89);Muscle weakness (generalized) (M62.81);Difficulty in walking, not elsewhere classified (R26.2)     Time: 1100-1118 PT Time Calculation (min) (ACUTE ONLY): 18 min  Charges:  $Gait Training: 8-22 mins                     [name] Browning, PT, GCS 12/02/20,11:47 AM

## 2020-12-02 NOTE — Plan of Care (Signed)
  Problem: Education: Goal: Knowledge of General Education information will improve Description: Including pain rating scale, medication(s)/side effects and non-pharmacologic comfort measures 12/02/2020 1223 by Cristela Blue, RN Outcome: Adequate for Discharge 12/02/2020 1223 by Cristela Blue, RN Outcome: Progressing   Problem: Health Behavior/Discharge Planning: Goal: Ability to manage health-related needs will improve 12/02/2020 1223 by Cristela Blue, RN Outcome: Adequate for Discharge 12/02/2020 1223 by Cristela Blue, RN Outcome: Progressing   Problem: Clinical Measurements: Goal: Ability to maintain clinical measurements within normal limits will improve 12/02/2020 1223 by Cristela Blue, RN Outcome: Adequate for Discharge 12/02/2020 1223 by Cristela Blue, RN Outcome: Progressing Goal: Will remain free from infection 12/02/2020 1223 by Cristela Blue, RN Outcome: Adequate for Discharge 12/02/2020 1223 by Cristela Blue, RN Outcome: Progressing Goal: Diagnostic test results will improve 12/02/2020 1223 by Cristela Blue, RN Outcome: Adequate for Discharge 12/02/2020 1223 by Cristela Blue, RN Outcome: Progressing Goal: Respiratory complications will improve 12/02/2020 1223 by Cristela Blue, RN Outcome: Adequate for Discharge 12/02/2020 1223 by Cristela Blue, RN Outcome: Progressing Goal: Cardiovascular complication will be avoided 12/02/2020 1223 by Cristela Blue, RN Outcome: Adequate for Discharge 12/02/2020 1223 by Cristela Blue, RN Outcome: Progressing   Problem: Activity: Goal: Risk for activity intolerance will decrease 12/02/2020 1223 by Cristela Blue, RN Outcome: Adequate for Discharge 12/02/2020 1223 by Cristela Blue, RN Outcome: Progressing   Problem: Nutrition: Goal: Adequate nutrition will be maintained 12/02/2020 1223 by Cristela Blue, RN Outcome: Adequate for Discharge 12/02/2020 1223 by Cristela Blue, RN Outcome: Progressing    Problem: Coping: Goal: Level of anxiety will decrease 12/02/2020 1223 by Cristela Blue, RN Outcome: Adequate for Discharge 12/02/2020 1223 by Cristela Blue, RN Outcome: Progressing   Problem: Elimination: Goal: Will not experience complications related to bowel motility 12/02/2020 1223 by Cristela Blue, RN Outcome: Adequate for Discharge 12/02/2020 1223 by Cristela Blue, RN Outcome: Progressing Goal: Will not experience complications related to urinary retention 12/02/2020 1223 by Cristela Blue, RN Outcome: Adequate for Discharge 12/02/2020 1223 by Cristela Blue, RN Outcome: Progressing   Problem: Pain Managment: Goal: General experience of comfort will improve 12/02/2020 1223 by Cristela Blue, RN Outcome: Adequate for Discharge 12/02/2020 1223 by Cristela Blue, RN Outcome: Progressing   Problem: Safety: Goal: Ability to remain free from injury will improve 12/02/2020 1223 by Cristela Blue, RN Outcome: Adequate for Discharge 12/02/2020 1223 by Cristela Blue, RN Outcome: Progressing   Problem: Skin Integrity: Goal: Risk for impaired skin integrity will decrease 12/02/2020 1223 by Cristela Blue, RN Outcome: Adequate for Discharge 12/02/2020 1223 by Cristela Blue, RN Outcome: Progressing

## 2020-12-04 DIAGNOSIS — N200 Calculus of kidney: Secondary | ICD-10-CM | POA: Diagnosis not present

## 2020-12-04 DIAGNOSIS — Z20822 Contact with and (suspected) exposure to covid-19: Secondary | ICD-10-CM | POA: Insufficient documentation

## 2020-12-04 DIAGNOSIS — Z5321 Procedure and treatment not carried out due to patient leaving prior to being seen by health care provider: Secondary | ICD-10-CM | POA: Diagnosis not present

## 2020-12-04 DIAGNOSIS — I959 Hypotension, unspecified: Secondary | ICD-10-CM | POA: Diagnosis not present

## 2020-12-04 DIAGNOSIS — R509 Fever, unspecified: Secondary | ICD-10-CM | POA: Diagnosis not present

## 2020-12-04 DIAGNOSIS — R0689 Other abnormalities of breathing: Secondary | ICD-10-CM | POA: Diagnosis not present

## 2020-12-04 DIAGNOSIS — R0902 Hypoxemia: Secondary | ICD-10-CM | POA: Diagnosis not present

## 2020-12-04 DIAGNOSIS — R112 Nausea with vomiting, unspecified: Secondary | ICD-10-CM | POA: Diagnosis not present

## 2020-12-04 DIAGNOSIS — R531 Weakness: Secondary | ICD-10-CM | POA: Insufficient documentation

## 2020-12-04 DIAGNOSIS — R1111 Vomiting without nausea: Secondary | ICD-10-CM | POA: Diagnosis not present

## 2020-12-04 NOTE — Progress Notes (Signed)
Patient ID: [name] Browning, male    DOB: 1940-07-25, 80 y.o.   MRN: [id number]  HPI  [name] Browning is a 80 y/o male with a history of carotid disease, CAD, hyperlipidemia, HTN, CKD, stroke, anxiety, atrial fibrillation, COPD, DVT, GERD, previous tobacco use and chronic heart failure.   Echo report from 11/15/20 reviewed and showed an EF of 60-65% along with severe LAE and mild [name].   LHC done 11/14/20 showed: Ost RCA lesion is 95% stenosed.   Prox RCA to Dist RCA lesion is 100% stenosed. -Very minimal flow.  After initial angiography, it disappeared to progressed to the ostium.   Left-to-right collaterals via septal perforators fill the PDA system.   Mid LM to Dist LM lesion is 20% stenosed.   Ost LAD to Prox LAD lesion is 25% stenosed.  Relatively small caliber vessel that wraps the apex.   Diminutive LCx with moderate sized OM1, mild disease.   The left ventricular systolic function is normal. No obvious regional wall motion normalities.   The left ventricular ejection fraction is 55-65% by visual estimate.   LV end diastolic pressure is moderately elevated.  18 mmHg   There is no aortic valve stenosis.  Admitted 11/11/20 due to shortness of breath due to acute on chronic HF. Initially treated with IV lasix with transition to oral diuretics. Elevated troponin. Eliquis held due to GIB. GI and cardiology consults obtained. Started on IV protonix. Developed cardiogenic shock and treated with levophed. Renal function worsened due to dehydration and contrast dye but improved with IV hydration. Antibiotics started due to UTI. Discharged after 21 days. Admitted twice in October.   He presents today for his initial visit with a chief complaint of moderate shortness of breath with very little exertion. He describes this as chronic in nature having been present for several years although he does feel like it has worsened over the last few days. He has associated fatigue, productive cough (with blood tinged  sputum), wheezing, intermittent chest pain, pedal edema, palpitations, abdominal distention, light-headedness, anxiety and difficulty sleeping along with this. He denies any abdominal pain or weight gain.   Both patient and his granddaughter admit that patient tends to feel quite anxious and acknowledge that his anxiety is making his shortness of breath worse. He has an appointment with PCP later today and they are going to discuss his anxiety with them and see if anything can be prescribed.   Saw pulmonology a few months ago and continues to cough up blood tinged sputum. He says that he doesn't want to see that same pulmonologist and granddaughter says that she will call [facility] and get him established over there. Granddaughter has been giving patient OTC potassium because his last potassium level was low.   Past Medical History:  Diagnosis Date   Anginal pain ([place])    Anxiety    Aortic atherosclerosis ([place])    Atrial fibrillation ([place]) 01/2019   Bilateral carpal tunnel syndrome    CAD (coronary artery disease)    a.) LHC 2004 --> normal coronaries. b.) normal stress test in 2007 and 2011; c.) Lexiscan 05/20/2014 --> LVEF 55-65%; no significant stress induced ischemia/arrythmia. d.) CT chest 03/25/2019 --> coronaries carcified.   Carotid atherosclerosis, bilateral    Carpal tunnel syndrome, bilateral    Chronic anticoagulation    a.) ASA + apixaban   COPD (chronic obstructive pulmonary disease) (HCC)    CVA (cerebral vascular accident) ([place])    Degenerative disc disease, cervical  Diastolic dysfunction    a.) TTE 05/29/2014 --> LVEF 60-65%; G1DD.   DVT (deep venous thrombosis) (HCC)    GERD (gastroesophageal reflux disease)    History of 2019 novel coronavirus disease (COVID-19) 02/08/2019   HLD (hyperlipidemia)    Hypertension    Kidney stones    Osteoarthritis of right shoulder    Pneumonia    Respiratory failure, acute ([place]) 09/20/2020   a.) severe respiratory distress 1  hour after urological surgery. CXR (+) for acute pulmonary edema. Transferred to ICU and placed on NIPPV. Questionable aspiration PNA. (+) A.fib with RVR. Improved with ABX, diuresis, and amiodarone.   Skin cancer of face    a.) RIGHT ear and RIGHT forehead; excised.   Syncope    TIA (transient ischemic attack) 2016   Valvular regurgitation    a.) TTE 05/26/2014 --> LVEF 60-65%; trivial [name], mild TR; no AR or PR. b.) TTE 04/20/2015 --> LVEF 55-60%; trivial [name] and PR; no AR or TR.   Past Surgical History:  Procedure Laterality Date   CARDIAC CATHETERIZATION  2004   CARPAL TUNNEL RELEASE Right 06/11/2013   CENTRAL LINE INSERTION  11/14/2020   Procedure: CENTRAL LINE INSERTION;  Surgeon: Leonie Man, MD;  Location: [facility] [facility];  Service: Cardiovascular;;   CORONARY/GRAFT ACUTE MI REVASCULARIZATION N/A 11/14/2020   Procedure: Coronary/Graft Acute MI Revascularization;  Surgeon: Leonie Man, MD;  Location: [facility];  Service: Cardiovascular;  Laterality: N/A;   CYSTOSCOPY W/ RETROGRADES  10/16/2020   Procedure: CYSTOSCOPY WITH RETROGRADE PYELOGRAM;  Surgeon: Billey Co, MD;  Location: [facility];  Service: Urology;;   CYSTOSCOPY W/ URETERAL STENT PLACEMENT Left 09/20/2020   Procedure: CYSTOSCOPY WITH RETROGRADE PYELOGRAM/URETERAL STENT PLACEMENT;  Surgeon: Janith Lima, MD;  Location: [facility];  Service: Urology;  Laterality: Left;   CYSTOSCOPY/URETEROSCOPY/HOLMIUM LASER/STENT PLACEMENT Left 10/16/2020   Procedure: CYSTOSCOPY/URETEROSCOPY/HOLMIUM LASER/STENT PLACEMENT;  Surgeon: Billey Co, MD;  Location: [facility];  Service: Urology;  Laterality: Left;   ESOPHAGOGASTRODUODENOSCOPY N/A 11/06/2020   Procedure: ESOPHAGOGASTRODUODENOSCOPY (EGD);  Surgeon: Lucilla Lame, MD;  Location: [facility];  Service: Endoscopy;  Laterality: N/A;   LEFT HEART CATH AND CORONARY ANGIOGRAPHY N/A 11/14/2020   Procedure: LEFT HEART CATH AND CORONARY ANGIOGRAPHY;  Surgeon:  Leonie Man, MD;  Location: [place] [place] [facility];  Service: Cardiovascular;  Laterality: N/A;   SHOULDER ARTHROSCOPY WITH OPEN ROTATOR CUFF REPAIR Right 08/24/2017   Procedure: SHOULDER ARTHROSCOPY WITH OPEN ROTATOR CUFF REPAIR;  Surgeon: Corky Mull, MD;  Location: [facility];  Service: Orthopedics;  Laterality: Right;   SKIN CANCER EXCISION  12/01/2016   right ear    SKIN CANCER EXCISION     remove from the right side of the face    THROMBECTOMY Right 2004   leg   THYROIDECTOMY  1950   Not sure if total or partial thyroidectomy.   Family History  Problem Relation Age of Onset   Hypertension Mother    Heart disease Mother    CAD Father    Heart attack Father    Social History   Tobacco Use   Smoking status: Former    Packs/day: 1.00    Years: 46.00    Pack years: 46.00    Types: Cigarettes    Start date: 01/10/1957    Quit date: 02/11/2003    Years since quitting: 17.8   Smokeless tobacco: Former    Types: Snuff    Quit date: 02/11/2003  Substance Use Topics  Alcohol use: Not Currently    Alcohol/week: 7.0 standard drinks    Types: 7 Cans of beer per week   Allergies  Allergen Reactions   Hydromorphone     Hallucination Pt reports he doesn't know about this   Prior to Admission medications   Medication Sig Start Date End Date Taking? Authorizing Provider  acetaminophen (TYLENOL) 500 MG tablet Take 2 tablets (1,000 mg total) by mouth every 6 (six) hours as needed for mild pain. 05/07/20  Yes Piscoya, Jacqulyn Bath, MD  apixaban (ELIQUIS) 5 MG TABS tablet Take 1 tablet (5 mg total) by mouth 2 (two) times daily. 09/28/20 12/07/20 Yes Pokhrel, Laxman, MD  aspirin EC 81 MG tablet Take 81 mg by mouth daily after supper.   Yes [provider]  atorvastatin (LIPITOR) 40 MG tablet Take 40 mg by mouth daily after supper.   Yes [provider]  cetirizine (ZYRTEC) 10 MG tablet Take 10 mg by mouth daily. 09/29/20  Yes [provider]  docusate sodium (COLACE) 100  MG capsule Take 1 capsule (100 mg total) by mouth 2 (two) times daily. 12/02/20 12/02/21 Yes Lavina Hamman, MD  famotidine (PEPCID) 20 MG tablet One after supper 10/06/20  Yes Tanda Rockers, MD  Ferrous Gluconate (IRON) 240 (27 Fe) MG TABS Take 27 mg by mouth daily after supper. 09/21/18  Yes [provider]  furosemide (LASIX) 40 MG tablet Take 1 tablet (40 mg total) by mouth 2 (two) times daily. Take additional 40 mg daily for weight gain of 3lbs in 1 day or 5lbs in 2 days 12/02/20  Yes Lavina Hamman, MD  guaiFENesin-dextromethorphan [facility] DM) 100-10 MG/5ML syrup Take 5 mLs by mouth every 4 (four) hours as needed for cough. 09/28/20  Yes Pokhrel, Laxman, MD  isosorbide mononitrate (IMDUR) 30 MG 24 hr tablet Take 0.5 tablets (15 mg total) by mouth daily. 12/03/20  Yes Lavina Hamman, MD  lactulose (CHRONULAC) 10 GM/15ML solution Take 30 mLs (20 g total) by mouth 2 (two) times daily as needed for moderate constipation. 12/02/20  Yes Lavina Hamman, MD  montelukast (SINGULAIR) 10 MG tablet Take 10 mg by mouth at bedtime.   Yes [provider]  pantoprazole (PROTONIX) 40 MG tablet Take 1 tablet (40 mg total) by mouth 2 (two) times daily. 12/02/20  Yes Lavina Hamman, MD  phenazopyridine (PYRIDIUM) 200 MG tablet Take 1 tablet (200 mg total) by mouth 3 (three) times daily as needed (bladder pain). 09/28/20  Yes Pokhrel, Laxman, MD  polyethylene glycol (MIRALAX / GLYCOLAX) 17 g packet Take 17 g by mouth daily as needed for mild constipation or moderate constipation. Patient taking differently: Take 17 g by mouth daily. 09/28/20  Yes Pokhrel, Corrie Mckusick, MD  SYMBICORT 160-4.5 MCG/ACT inhaler Inhale 2 puffs into the lungs 2 (two) times daily as needed (shortness of breath). 12/07/18  Yes [provider]  tamsulosin (FLOMAX) 0.4 MG CAPS capsule TAKE 1 CAPSULE BY MOUTH EVERY DAY 11/09/20  Yes Billey Co, MD    Review of Systems  Constitutional:  Positive for appetite  change and fatigue (easily).  HENT:  Negative for congestion, rhinorrhea and sore throat.   Eyes: Negative.   Respiratory:  Positive for cough (productive with slight blod tinged), shortness of breath (easily) and wheezing.   Cardiovascular:  Positive for chest pain (at times), palpitations and leg swelling.  Gastrointestinal:  Positive for abdominal distention. Negative for abdominal pain.  Endocrine: Negative.   Genitourinary: Negative.  Musculoskeletal:  Negative for back pain and neck pain.  Skin: Negative.   Allergic/Immunologic: Negative.   Neurological:  Positive for light-headedness. Negative for dizziness.  Hematological:  Negative for adenopathy. Bruises/bleeds easily.  Psychiatric/Behavioral:  Positive for sleep disturbance (sleeping in recliner). Negative for dysphoric mood. The patient is nervous/anxious.    Vitals:   12/07/20 1012  BP: 106/62  Pulse: 95  Resp: 18  SpO2: (!) 85%  Weight: 187 lb 6 oz (85 kg)  Height: 6\' 2"  (1.88 m)   Wt Readings from Last 3 Encounters:  12/07/20 187 lb 6 oz (85 kg)  12/02/20 184 lb 4.8 oz (83.6 kg)  11/06/20 190 lb (86.2 kg)   Lab Results  Component Value Date   CREATININE 0.95 12/05/2020   CREATININE 1.04 12/02/2020   CREATININE 1.01 12/01/2020   Physical Exam Vitals and nursing note reviewed. Exam conducted with a chaperone present (granddaughter ([name] Browning)).  Constitutional:      Appearance: Normal appearance.  HENT:     Head: Normocephalic and atraumatic.  Cardiovascular:     Rate and Rhythm: Normal rate. Rhythm irregular.  Pulmonary:     Effort: Prolonged expiration present.     Breath sounds: Examination of the right-lower field reveals rales. Examination of the left-lower field reveals rales. Rales present. No wheezing or rhonchi.  Abdominal:     General: There is distension.  Musculoskeletal:        General: Tenderness (to palpation) present.     Cervical back: Normal range of motion and neck supple.     Right  lower leg: Edema (2+ pitting) present.     Left lower leg: Edema (2+ pitting) present.  Skin:    General: Skin is warm and dry.  Neurological:     General: No focal deficit present.     Mental Status: He is alert and oriented to person, place, and time.  Psychiatric:        Mood and Affect: Mood is anxious.        Behavior: Behavior normal.    Assessment & Plan:  1: Acute on Chronic heart failure with preserved ejection fraction with structural changes (LAE)- - NYHA class III - fluid overloaded today with pedal edema, abdominal distention, weight gain and worsening shortness of breath - will send for 80mg  IV lasix/ 14meq PO potassium today - will also change diuretic to torsemide; advised that starting tomorrow he will take 2 torsemide daily and stop furosemide; can take additional 40mg  torsemide if needed for above weight gain or worsening edema - also advised to stop taking the OTC potassium and begin potassium 37meq daily - will check labs at next visit in 2 days - they are going to discuss with PCP something for his anxiety as they recognize that his anxiety makes his breathing worse - BNP 11/11/20 was 220.2  2: HTN- - BP looks good today (106/62) - saw PCP (Delcambre) & has f/u either later today or tomorrow - BMP 12/02/20 reviewed and showed sodium 136, potassium 3.8, creatinine 1.04 and GFR >60  3: COPD- - saw pulmonology [name] Browning) 10/06/20 but doesn't want to return to him - discussed other options and the granddaughter says that they will call [facility] to get established with pulmonology there  - wearing oxygen at 4L now and previously was only wearing it at 2L; occasionally has to increase it to 5L  4: Atrial fibrillation- - saw cardiology ([name] Browning) 05/04/20; returns 12/16/20 - coughing up blood tinged sputum, advised to call  cardiology to discuss eliquis usage if this persists   5: Lymphedema- - stage 2 - wearing compression socks daily - elevating legs during  the day but edema persists - limited in his ability to exercise due to symptoms - changing diuretic per above - consider compression boots if edema persists   Patient did not bring his medications nor a list. Each medication was verbally reviewed with the patient and he was encouraged to bring the bottles to every visit to confirm accuracy of list.   Return in 2 days or sooner for any questions/problems before then.

## 2020-12-04 NOTE — ED Triage Notes (Signed)
FIRST NURSE NOTE:  Pt arrived via [facility] from home, recently DC'd from hospital for CHF.  RR 35, 94% 3L, CBG 197 118/61  hx of afib rate 80

## 2020-12-05 ENCOUNTER — Emergency Department
Admission: EM | Admit: 2020-12-05 | Discharge: 2020-12-05 | Disposition: A | Discharge status: Home / Self Care | Payer: Medicare HMO | Attending: Emergency Medicine | Admitting: Emergency Medicine

## 2020-12-05 ENCOUNTER — Other Ambulatory Visit: Payer: Self-pay

## 2020-12-05 DIAGNOSIS — R531 Weakness: Secondary | ICD-10-CM | POA: Diagnosis not present

## 2020-12-05 DIAGNOSIS — R0602 Shortness of breath: Secondary | ICD-10-CM | POA: Diagnosis not present

## 2020-12-05 LAB — CBC
HCT: 28.6 % — ABNORMAL LOW (ref 39.0–52.0)
Hemoglobin: 8.6 g/dL — ABNORMAL LOW (ref 13.0–17.0)
MCH: 27.9 pg (ref 26.0–34.0)
MCHC: 30.1 g/dL (ref 30.0–36.0)
MCV: 92.9 fL (ref 80.0–100.0)
Platelets: 244 10*3/uL (ref 150–400)
RBC: 3.08 MIL/uL — ABNORMAL LOW (ref 4.22–5.81)
RDW: 16.3 % — ABNORMAL HIGH (ref 11.5–15.5)
WBC: 12.6 10*3/uL — ABNORMAL HIGH (ref 4.0–10.5)
nRBC: 0 % (ref 0.0–0.2)

## 2020-12-05 LAB — BASIC METABOLIC PANEL
Anion gap: 9 (ref 5–15)
BUN: 21 mg/dL (ref 8–23)
CO2: 31 mmol/L (ref 22–32)
Calcium: 8.6 mg/dL — ABNORMAL LOW (ref 8.9–10.3)
Chloride: 97 mmol/L — ABNORMAL LOW (ref 98–111)
Creatinine, Ser: 0.95 mg/dL (ref 0.61–1.24)
GFR, Estimated: >60 mL/min (ref >60)
Glucose, Bld: 133 mg/dL — ABNORMAL HIGH (ref 70–99)
Potassium: 3.1 mmol/L — ABNORMAL LOW (ref 3.5–5.1)
Sodium: 137 mmol/L (ref 135–145)

## 2020-12-05 LAB — URINALYSIS, ROUTINE W REFLEX MICROSCOPIC
Bilirubin Urine: NEGATIVE
Glucose, UA: NEGATIVE mg/dL
Ketones, ur: NEGATIVE mg/dL
Nitrite: NEGATIVE
Protein, ur: 100 mg/dL — AB
Specific Gravity, Urine: 1.02 (ref 1.005–1.030)
WBC, UA: >50 WBC/hpf — ABNORMAL HIGH (ref 0–5)
pH: 6 (ref 5.0–8.0)

## 2020-12-05 LAB — RESP PANEL BY RT-PCR (FLU A&B, COVID) ARPGX2
Influenza A by PCR: NEGATIVE
Influenza B by PCR: NEGATIVE
SARS Coronavirus 2 by RT PCR: NEGATIVE

## 2020-12-05 NOTE — ED Triage Notes (Signed)
Pt c/o generalized weakness. Pt had episode of n/v. Pt was discharged a few days ago. Pt did have sick contact. Pt has not had the best PO intake.

## 2020-12-06 NOTE — Discharge Summary (Signed)
Physician Discharge Summary   Patient name: [name] Browning  Admit date:     11/11/2020  Discharge date: 12/02/2020   Discharge Physician: Berle Mull   PCP: [place]   Recommendations at discharge: maintain follow up as recommended  Discharge Diagnoses Principal Problem:   Acute on chronic diastolic CHF (congestive heart failure) ([place]) Active Problems:   HTN (hypertension)   COPD, moderate (HCC)   Acute on chronic respiratory failure with hypoxia (HCC)   Atrial fibrillation (HCC)   DVT (deep venous thrombosis) (HCC)   HLD (hyperlipidemia)   Stroke (HCC)   CAD (coronary artery disease)   NSTEMI (non-ST elevated myocardial infarction) ([place])   Hypokalemia   Thrombocytopenia (HCC)   Iron deficiency anemia   GI bleeding   Acute ST elevation myocardial infarction (STEMI) of anterior wall (HCC)   Cardiogenic shock ([place])   Hemorrhagic shock [facility])  Hospital Course   Acute on chronic diastolic CHF Acute on chronic right heart failure Echo 11/6: LVEF 60-65%, LV regional wall motion abnormalities, Grade 1 diastolic dysfunction, RV systolic function severely reduced with RV severe enlargement, LA + RA severely dilated Pt developed 2-3+ pitting edema of BLE's and scrotal swelling. Started IV diuresis on 11/13 Treated for several days with Lasix IV 40 mg twice daily. --Cardiology was following, Dr. Erin Fulling  --Transition to oral Lasix 80 mg BID,  --Renal function stable, monitor   Recurrent chest pain, likely anginal - resolved Brief episodes, responsive to SL nitro.   Started on Imdur 15 mg by cardiology.   No chest pain since Imdur started. --Continue Imdur 15 mg daily   Bradycardia, intermittent - Due to mild sick sinus syndrome. Not symptomatic at this time. Episodes occur mainly in the setting of vagal maneuvers including urination and defecation. --avoid beta-blocker, per cardiology   Acute on chronic hypoxic respiratory failure - on 2 L/min at baseline.  Now  stable, back on baseline oxygen. --Supplemental O2 to keep sats between 88-92%   Acute Anterior STEMI  Hx of Ischemic Cardiomyopathy CODE STEMI was called on 11/5 with ST elevations V2,V3.   Cardiac catheterization showed no significant obstruction in the LAD with occluded RCA and collaterals from LAD to RCA.  It appeared that RCA was chronically occluded. No intervention. Treated with heparin gtt, now off. --Continue ASA and statin --Hold beta blocker due to hypotension and intermittent bradycardia --Continue Imdur 15 mg daily (new)   Hypomagnesemia -resolved after replacement.   --Monitor replace as needed   CARDIOGENIC SHOCK, resolved Hypotension, improved Weaned off Levophed and dopamine as of morning of 11/9 Was on midodrine while diuresing, now stopped per cardiology   Anemia, acute on chronic Iron deficiency versus ? Acute blood loss secondary to GI bleed Presented with dark-colored stools and stool occult positive, drop in hgb which improved after pRBC transfusion.  --GI consulted -- no plan for endoscopy at this time --s/p 2 units pRBC's --cont oral PPI   ACUTE KIDNEY INJURY -resolved.  Multifactorial due to dehydration, IV contrast.   Renal function improved with gentle IVF hydration. --monitor renal function with diuresis   COPD - stable, no wheezing, on baseline 2 L/min O2 Continue bronchodilators   Dysuria - started on ceftriaxone by night team.  Urine cx grew E coli.   Completed 3 days of ceftriaxone. --11/21: Dysuria reported.  No fevers or chills.  No leukocytosis.  No hematuria. --Started Augmentin based on culture from earlier this admission   BPH -continue home Flomax   Procedures performed:  none   Condition at discharge: good  Exam General: Appear in mild distress, no Rash; Oral Mucosa Clear, moist. no Abnormal Neck Mass Or lumps, Conjunctiva normal  Cardiovascular: S1 and S2 Present, no Murmur, Respiratory: good respiratory effort, Bilateral Air  entry present and CTA, no Crackles, no wheezes Abdomen: Bowel Sound present, Soft and no tenderness Extremities: no Pedal edema Neurology: alert and oriented to time, place, and person affect appropriate. no new focal deficit Gait not checked due to patient safety concerns    Disposition: Home  Discharge time: greater than 30 minutes.  Follow-up [place]. Schedule an appointment as soon as possible for a visit in 1 week(s).   Why: @ Contact information: [address] [place] [address] [phone]         Corey Skains, MD. Schedule an appointment as soon as possible for a visit on 12/16/2020.   Specialty: Cardiology Why: @ 2pm Patient will see the PA. Contact information: [address] [facility] [place] [address] [phone]                 Allergies as of 12/02/2020       Reactions   Hydromorphone    Hallucination Pt reports he doesn't know about this        Medication List     TAKE these medications    acetaminophen 500 MG tablet Commonly known as: TYLENOL Take 2 tablets (1,000 mg total) by mouth every 6 (six) hours as needed for mild pain.   apixaban 5 MG Tabs tablet Commonly known as: ELIQUIS Take 1 tablet (5 mg total) by mouth 2 (two) times daily.   aspirin EC 81 MG tablet Take 81 mg by mouth daily after supper.   atorvastatin 40 MG tablet Commonly known as: LIPITOR Take 40 mg by mouth daily after supper.   cetirizine 10 MG tablet Commonly known as: ZYRTEC Take 10 mg by mouth daily.   docusate sodium 100 MG capsule Commonly known as: Colace Take 1 capsule (100 mg total) by mouth 2 (two) times daily.   famotidine 20 MG tablet Commonly known as: Pepcid One after supper   furosemide 40 MG tablet Commonly known as: LASIX Take 1 tablet (40 mg total) by mouth 2 (two) times daily. Take additional 40 mg daily for weight gain of 3lbs in 1 day or 5lbs in 2 days    guaiFENesin-dextromethorphan 100-10 MG/5ML syrup Commonly known as: ROBITUSSIN DM Take 5 mLs by mouth every 4 (four) hours as needed for cough.   Iron 240 (27 Fe) MG Tabs Take 27 mg by mouth daily after supper.   isosorbide mononitrate 30 MG 24 hr tablet Commonly known as: IMDUR Take 0.5 tablets (15 mg total) by mouth daily.   lactulose 10 GM/15ML solution Commonly known as: CHRONULAC Take 30 mLs (20 g total) by mouth 2 (two) times daily as needed for moderate constipation.   montelukast 10 MG tablet Commonly known as: SINGULAIR Take 10 mg by mouth at bedtime.   pantoprazole 40 MG tablet Commonly known as: PROTONIX Take 1 tablet (40 mg total) by mouth 2 (two) times daily. What changed: when to take this   phenazopyridine 200 MG tablet Commonly known as: PYRIDIUM Take 1 tablet (200 mg total) by mouth 3 (three) times daily as needed (bladder pain).   polyethylene glycol 17 g packet Commonly known as: MIRALAX / GLYCOLAX Take 17 g by mouth daily as needed for  mild constipation or moderate constipation. What changed: when to take this   Symbicort 160-4.5 MCG/ACT inhaler Generic drug: budesonide-formoterol Inhale 2 puffs into the lungs 2 (two) times daily as needed (shortness of breath).   tamsulosin 0.4 MG Caps capsule Commonly known as: FLOMAX TAKE 1 CAPSULE BY MOUTH EVERY DAY               Discharge Care Instructions  (From admission, onward)           Start     Ordered   12/02/20 0000  Leave dressing on - Keep it clean, dry, and intact until clinic visit        12/02/20 1134            DG Abd 1 View  Result Date: 11/18/2020 CLINICAL DATA:  Nausea and vomiting. EXAM: ABDOMEN - 1 VIEW COMPARISON:  10/20/2020. FINDINGS: Air is seen in the stomach. Gas in nondistended small bowel and colon. Gas in the rectum. Possible left renal stone. Coarsened basilar pulmonary markings. IMPRESSION: 1. No acute findings. 2. Possible left renal stone. 3. Coarsened  basilar pulmonary markings. Electronically Signed   By: Lorin Picket M.D.   On: 11/18/2020 10:35   CARDIAC CATHETERIZATION  Result Date: 11/14/2020   Ost RCA lesion is 95% stenosed.   Prox RCA to Dist RCA lesion is 100% stenosed. -Very minimal flow.  After initial angiography, it disappeared to progressed to the ostium.   Left-to-right collaterals via septal perforators fill the PDA system.   Mid LM to Dist LM lesion is 20% stenosed.   Ost LAD to Prox LAD lesion is 25% stenosed.  Relatively small caliber vessel that wraps the apex.   Diminutive LCx with moderate sized OM1, mild disease.   The left ventricular systolic function is normal. No obvious regional wall motion normalities.   The left ventricular ejection fraction is 55-65% by visual estimate.   LV end diastolic pressure is moderately elevated.  18 mmHg   There is no aortic valve stenosis. SUMMARY Cardiogenic shock-SCAI scale A-B => not favorable for mechanical support due to ongoing anemia, lack of active anginal symptoms and preserved EF with minimally elevated LVEDP. Severe single-vessel CAD with 95% ostial RCA and 1% flush occlusion of the proximal RCA- Unable to engage with guide catheter to wire the vessel. Does not correlate with anterior elevations and no wall motion normalities Minimal diffuse LAD disease with left-to-right collaterals filling the PDA and part of the PL system. Preserved LVEF of 50 to 55% with no obvious wall motion abnormalities. Successful Right IJ Triple-Lumen Catheter placed RECOMMENDATIONS Would not treat with antiplatelet agent given total occluded vessel and need for DOAC for A. fib.  Hold off on anticoagulation until hemodynamically stable Continue to titrate Levophed and dopamine for blood pressure support Transfuse to keep hemoglobin greater than 10 Consider possibility of RV involvement as potential cause of shock. Glenetta Hew, MD  US Venous Img Lower Bilateral (DVT)  Result Date: 11/11/2020 CLINICAL DATA:   Shortness of breath EXAM: Bilateral LOWER EXTREMITY VENOUS DOPPLER ULTRASOUND TECHNIQUE: Gray-scale sonography with compression, as well as color and duplex ultrasound, were performed to evaluate the deep venous system(s) from the level of the common femoral vein through the popliteal and proximal calf veins. COMPARISON:  None. FINDINGS: VENOUS Normal compressibility of the common femoral, superficial femoral, and popliteal veins, as well as the visualized calf veins. Visualized portions of profunda femoral vein and great saphenous vein unremarkable. No filling defects to suggest DVT on  grayscale or color Doppler imaging. Doppler waveforms show normal direction of venous flow, normal respiratory plasticity and response to augmentation. Limited views of the contralateral common femoral vein are unremarkable. OTHER None. Limitations: none IMPRESSION: Negative. Electronically Signed   By: Merilyn Baba M.D.   On: 11/11/2020 18:07   DG Chest Port 1 View  Result Date: 11/15/2020 CLINICAL DATA:  Acute respiratory failure with hypoxia EXAM: PORTABLE CHEST 1 VIEW COMPARISON:  Chest radiograph from one day prior. FINDINGS: Right internal jugular central venous catheter terminates at the cavoatrial junction. Pacer pads overlie the left chest. Stable cardiomediastinal silhouette with mild cardiomegaly. No pneumothorax. No pleural effusion. Mild pulmonary edema, similar. Mild to moderate bibasilar atelectasis, worsened. IMPRESSION: 1. Stable mild congestive heart failure. 2. Mild to moderate bibasilar atelectasis, worsened. Electronically Signed   By: Ilona Sorrel M.D.   On: 11/15/2020 07:08   DG Chest Port 1 View  Result Date: 11/14/2020 CLINICAL DATA:  Central line placement. Hypoxia. Congestive heart failure. EXAM: PORTABLE CHEST 1 VIEW COMPARISON:  Prior today FINDINGS: A new right jugular central venous catheter is seen with tip overlying the proximal right atrium. No evidence of pneumothorax. Mild cardiomegaly  stable. Diffuse interstitial infiltrates remains stable. Loculated fluid within the left major fissure is stable, and mild atelectasis or scarring is again seen in the right upper lobe. IMPRESSION: New right jugular central venous catheter tip overlies the proximal right atrium. No evidence of pneumothorax. Stable cardiomegaly and diffuse interstitial infiltrates/edema. Stable small loculated left pleural effusion, and right upper lobe atelectasis versus scarring. Electronically Signed   By: Marlaine Hind M.D.   On: 11/14/2020 14:11   DG Chest Port 1 View  Result Date: 11/14/2020 CLINICAL DATA:  Hypoxia EXAM: PORTABLE CHEST 1 VIEW COMPARISON:  11/11/2020 chest radiograph. FINDINGS: Stable cardiomediastinal silhouette with mild cardiomegaly. No pneumothorax. Focal opacity overlying the peripheral mid to upper left lung appears more discrete. No right pleural effusion. Similar mild pulmonary edema. Stable small focal lung opacity in the peripheral upper right lung. IMPRESSION: 1. Similar mild congestive heart failure. 2. Focal opacity overlying the peripheral mid to upper left lung appears more discrete. Stable smaller focal peripheral upper right lung opacity. These may represent loculated pleural effusions and/or pulmonary nodules. Suggest further evaluation with noncontrast chest CT. Electronically Signed   By: Ilona Sorrel M.D.   On: 11/14/2020 07:26   DG Chest Portable 1 View  Result Date: 11/11/2020 CLINICAL DATA:  Shortness of breath EXAM: PORTABLE CHEST 1 VIEW COMPARISON:  Previous studies including the examination of 10/29/2020 FINDINGS: Transverse diameter of heart is slightly increased. Thoracic aorta is tortuous and ectatic. There is prominence of interstitial markings in the parahilar regions with interval worsening. There is faint haziness in the lateral aspect of left mid and both lower lung fields. There is a small smooth marginated radiopacity, possibly in the lateral aspect of minor fissure in  the right mid lung fields. There is pleural thickening in both apices, more so on the left side. There is no pneumothorax. Lateral CP angles are clear. IMPRESSION: There is increase in interstitial markings in the parahilar regions suggesting interstitial edema or interstitial pneumonitis. Part of this finding is probably underlying scarring. There is diffuse haziness in the lateral aspect of left mid and both lower lung fields which may be due to pleural thickening or loculated pleural effusions or pneumonia. Follow-up chest radiographs and CT as clinically warranted should be considered. Electronically Signed   By: Prudy Feeler.D.  On: 11/11/2020 13:38   ECHOCARDIOGRAM COMPLETE  Result Date: 11/15/2020    ECHOCARDIOGRAM REPORT   Patient Name:   [name] Browning Date of Exam: 11/15/2020 Medical Rec #:  [id number]     Height:       74.0 in Accession #:    [id number]    Weight:       190.5 lb Date of Birth:  May 19, 1940     BSA:          2.129 m Patient Age:    80 years      BP:           105/60 mmHg Patient Gender: M             HR:           81 bpm. Exam Location:  [facility] Procedure: 2D Echo Indications:     Myocardial Infarct I21.9  History:         Patient has prior history of Echocardiogram examinations, most                  recent 10/23/2020.  Sonographer:     Kathlen Brunswick RDCS Referring Phys:  [place] Diagnosing Phys: [place]  1. Left ventricular ejection fraction, by estimation, is 60 to 65%. The left ventricle has normal function. The left ventricle demonstrates regional wall motion abnormalities (see scoring diagram/findings for description). Left ventricular diastolic parameters are consistent with Grade I diastolic dysfunction (impaired relaxation).  2. Right ventricular systolic function is severely reduced. The right ventricular size is severely enlarged.  3. Left atrial size was severely dilated.  4. Right atrial size was severely dilated.  5. The mitral valve is  normal in structure. Mild mitral valve regurgitation. No evidence of mitral stenosis. Moderate mitral annular calcification.  6. The aortic valve is normal in structure. Aortic valve regurgitation is not visualized. Mild aortic valve sclerosis is present, with no evidence of aortic valve stenosis.  7. The inferior vena cava is normal in size with greater than 50% respiratory variability, suggesting right atrial pressure of 3 mmHg. FINDINGS  Left Ventricle: Left ventricular ejection fraction, by estimation, is 60 to 65%. The left ventricle has normal function. The left ventricle demonstrates regional wall motion abnormalities. The left ventricular internal cavity size was normal in size. There is no left ventricular hypertrophy. Left ventricular diastolic parameters are consistent with Grade I diastolic dysfunction (impaired relaxation). Right Ventricle: The right ventricular size is severely enlarged. No increase in right ventricular wall thickness. Right ventricular systolic function is severely reduced. Left Atrium: Left atrial size was severely dilated. Right Atrium: Right atrial size was severely dilated. Pericardium: There is no evidence of pericardial effusion. Mitral Valve: The mitral valve is normal in structure. Moderate mitral annular calcification. Mild mitral valve regurgitation. No evidence of mitral valve stenosis. Tricuspid Valve: The tricuspid valve is normal in structure. Tricuspid valve regurgitation is mild . No evidence of tricuspid stenosis. Aortic Valve: The aortic valve is normal in structure. Aortic valve regurgitation is not visualized. Mild aortic valve sclerosis is present, with no evidence of aortic valve stenosis. Aortic valve peak gradient measures 5.5 mmHg. Pulmonic Valve: The pulmonic valve was normal in structure. Pulmonic valve regurgitation is not visualized. No evidence of pulmonic stenosis. Aorta: The aortic root is normal in size and structure. Venous: The inferior vena cava is  normal in size with greater than 50% respiratory variability, suggesting right atrial pressure of 3 mmHg. IAS/Shunts: No atrial  level shunt detected by color flow Doppler.  LEFT VENTRICLE PLAX 2D LVIDd:         3.74 cm   Diastology LVIDs:         2.69 cm   LV e' medial:    5.77 cm/s LV PW:         1.26 cm   LV E/e' medial:  20.8 LV IVS:        1.18 cm   LV e' lateral:   7.83 cm/s LVOT diam:     1.80 cm   LV E/e' lateral: 15.3 LV SV:         29 LV SV Index:   14 LVOT Area:     2.54 cm  RIGHT VENTRICLE RV Basal diam:  4.32 cm RV S prime:     4.79 cm/s TAPSE (M-mode): 1.1 cm LEFT ATRIUM             Index        RIGHT ATRIUM           Index LA diam:        3.90 cm 1.83 cm/m   RA Area:     18.80 cm LA Vol (A2C):   42.4 ml 19.92 ml/m  RA Volume:   56.50 ml  26.54 ml/m LA Vol (A4C):   36.0 ml 16.91 ml/m LA Biplane Vol: 40.7 ml 19.12 ml/m  AORTIC VALVE                 PULMONIC VALVE AV Area (Vmax): 1.65 cm     PV Vmax:       0.94 m/s AV Vmax:        117.00 cm/s  PV Peak grad:  3.5 mmHg AV Peak Grad:   5.5 mmHg LVOT Vmax:      75.80 cm/s LVOT Vmean:     44.200 cm/s LVOT VTI:       0.113 m  AORTA Ao Root diam: 3.20 cm Ao Asc diam:  2.70 cm MITRAL VALVE                TRICUSPID VALVE MV Area (PHT): 3.76 cm     TV Peak grad:   17.4 mmHg MV Decel Time: 202 msec     TV Vmax:        2.08 m/s MV E velocity: 120.00 cm/s                             SHUNTS                             Systemic VTI:  0.11 m                             Systemic Diam: 1.80 cm Neoma Laming Electronically signed by Neoma Laming Signature Date/Time: 11/15/2020/10:38:00 AM    Final    Results for orders placed or performed during the hospital encounter of 11/11/20  Resp Panel by RT-PCR (Flu A&B, Covid) Nasopharyngeal Swab     Status: None   Collection Time: 11/11/20  1:40 PM   Specimen: Nasopharyngeal Swab; Nasopharyngeal(NP) swabs in vial transport medium  Result Value Ref Range Status   SARS Coronavirus 2 by RT PCR NEGATIVE NEGATIVE Final     Comment: (NOTE) SARS-CoV-2 target nucleic acids are NOT DETECTED.  The SARS-CoV-2 RNA is generally  detectable in upper respiratory specimens during the acute phase of infection. The lowest concentration of SARS-CoV-2 viral copies this assay can detect is 138 copies/mL. A negative result does not preclude SARS-Cov-2 infection and should not be used as the sole basis for treatment or other patient management decisions. A negative result may occur with  improper specimen collection/handling, submission of specimen other than nasopharyngeal swab, presence of viral mutation(s) within the areas targeted by this assay, and inadequate number of viral copies(<138 copies/mL). A negative result must be combined with clinical observations, patient history, and epidemiological information. The expected result is Negative.  Fact Sheet for Patients:  EntrepreneurPulse.com.au  Fact Sheet for Healthcare Providers:  IncredibleEmployment.be  This test is no t yet approved or cleared by the [place] FDA and  has been authorized for detection and/or diagnosis of SARS-CoV-2 by FDA under an Emergency Use Authorization (EUA). This EUA will remain  in effect (meaning this test can be used) for the duration of the COVID-19 declaration under Section 564(b)(1) of the Act, 21 U.S.C.section 360bbb-3(b)(1), unless the authorization is terminated  or revoked sooner.       Influenza A by PCR NEGATIVE NEGATIVE Final   Influenza B by PCR NEGATIVE NEGATIVE Final    Comment: (NOTE) The Xpert Xpress SARS-CoV-2/FLU/RSV plus assay is intended as an aid in the diagnosis of influenza from Nasopharyngeal swab specimens and should not be used as a sole basis for treatment. Nasal washings and aspirates are unacceptable for Xpert Xpress SARS-CoV-2/FLU/RSV testing.  Fact Sheet for Patients: EntrepreneurPulse.com.au  Fact Sheet for Healthcare  Providers: IncredibleEmployment.be  This test is not yet approved or cleared by the [place] FDA and has been authorized for detection and/or diagnosis of SARS-CoV-2 by FDA under an Emergency Use Authorization (EUA). This EUA will remain in effect (meaning this test can be used) for the duration of the COVID-19 declaration under Section 564(b)(1) of the Act, 21 U.S.C. section 360bbb-3(b)(1), unless the authorization is terminated or revoked.  Performed at [facility], [address] [place], [place] [address]   CULTURE, BLOOD (ROUTINE X 2) w Reflex to ID Panel     Status: None   Collection Time: 11/20/20  8:44 AM   Specimen: BLOOD  Result Value Ref Range Status   Specimen Description BLOOD BLOOD RIGHT WRIST  Final   Special Requests   Final    BOTTLES DRAWN AEROBIC AND ANAEROBIC Blood Culture adequate volume   Culture   Final    NO GROWTH 5 DAYS Performed at [facility], [address] [place], [place] [address]    Report Status 11/25/2020 FINAL  Final  CULTURE, BLOOD (ROUTINE X 2) w Reflex to ID Panel     Status: None   Collection Time: 11/20/20  8:44 AM   Specimen: BLOOD  Result Value Ref Range Status   Specimen Description BLOOD BLOOD LEFT HAND  Final   Special Requests   Final    BOTTLES DRAWN AEROBIC AND ANAEROBIC Blood Culture adequate volume   Culture   Final    NO GROWTH 5 DAYS Performed at [facility], [address] [place],  [address]    Report Status 11/25/2020 FINAL  Final  Urine Culture     Status: Abnormal   Collection Time: 11/20/20  2:09 PM   Specimen: Urine, Random  Result Value Ref Range Status   Specimen Description   Final    URINE, RANDOM Performed at [facility], [address],  [place], [place] [address]    Special Requests   Final    NONE Performed at [facility], [address] [place], [place] [address]    Culture (A)  Final    >=100,000 COLONIES/mL  ESCHERICHIA COLI Confirmed Extended Spectrum Beta-Lactamase Producer (ESBL).  In bloodstream infections from ESBL organisms, carbapenems are preferred over piperacillin/tazobactam. They are shown to have a lower risk of mortality.    Report Status 11/23/2020 FINAL  Final   Organism ID, Bacteria ESCHERICHIA COLI (A)  Final      Susceptibility   Escherichia coli - MIC*    AMPICILLIN >=32 RESISTANT Resistant     CEFAZOLIN >=64 RESISTANT Resistant     CEFEPIME 16 RESISTANT Resistant     CEFTRIAXONE >=64 RESISTANT Resistant     CIPROFLOXACIN >=4 RESISTANT Resistant     GENTAMICIN <=1 SENSITIVE Sensitive     IMIPENEM <=0.25 SENSITIVE Sensitive     NITROFURANTOIN <=16 SENSITIVE Sensitive     TRIMETH/SULFA >=320 RESISTANT Resistant     AMPICILLIN/SULBACTAM 4 SENSITIVE Sensitive     PIP/TAZO <=4 SENSITIVE Sensitive     * >=100,000 COLONIES/mL ESCHERICHIA COLI    Signed:  Berle Mull MD.  [facility] 12/06/2020, 7:30 AM

## 2020-12-07 ENCOUNTER — Other Ambulatory Visit: Payer: Self-pay

## 2020-12-07 ENCOUNTER — Ambulatory Visit (HOSPITAL_BASED_OUTPATIENT_CLINIC_OR_DEPARTMENT_OTHER): Payer: Medicare HMO | Admitting: Family

## 2020-12-07 ENCOUNTER — Other Ambulatory Visit: Payer: Self-pay | Admitting: Family

## 2020-12-07 ENCOUNTER — Telehealth: Payer: Self-pay | Admitting: Family

## 2020-12-07 ENCOUNTER — Ambulatory Visit
Admission: RE | Admit: 2020-12-07 | Discharge: 2020-12-07 | Disposition: A | Discharge status: Home / Self Care | Payer: Medicare HMO | Source: Ambulatory Visit | Attending: Family | Admitting: Family

## 2020-12-07 ENCOUNTER — Encounter: Payer: Self-pay | Admitting: Family

## 2020-12-07 VITALS — BP 106/62 | HR 95 | Resp 18 | Ht 74.0 in | Wt 187.4 lb

## 2020-12-07 DIAGNOSIS — F419 Anxiety disorder, unspecified: Secondary | ICD-10-CM | POA: Insufficient documentation

## 2020-12-07 DIAGNOSIS — I130 Hypertensive heart and chronic kidney disease with heart failure and stage 1 through stage 4 chronic kidney disease, or unspecified chronic kidney disease: Secondary | ICD-10-CM | POA: Insufficient documentation

## 2020-12-07 DIAGNOSIS — R042 Hemoptysis: Secondary | ICD-10-CM | POA: Insufficient documentation

## 2020-12-07 DIAGNOSIS — Z9981 Dependence on supplemental oxygen: Secondary | ICD-10-CM | POA: Insufficient documentation

## 2020-12-07 DIAGNOSIS — R42 Dizziness and giddiness: Secondary | ICD-10-CM | POA: Insufficient documentation

## 2020-12-07 DIAGNOSIS — R002 Palpitations: Secondary | ICD-10-CM | POA: Insufficient documentation

## 2020-12-07 DIAGNOSIS — I10 Essential (primary) hypertension: Secondary | ICD-10-CM | POA: Diagnosis not present

## 2020-12-07 DIAGNOSIS — Z86718 Personal history of other venous thrombosis and embolism: Secondary | ICD-10-CM | POA: Insufficient documentation

## 2020-12-07 DIAGNOSIS — I4891 Unspecified atrial fibrillation: Secondary | ICD-10-CM | POA: Insufficient documentation

## 2020-12-07 DIAGNOSIS — I2510 Atherosclerotic heart disease of native coronary artery without angina pectoris: Secondary | ICD-10-CM | POA: Insufficient documentation

## 2020-12-07 DIAGNOSIS — J449 Chronic obstructive pulmonary disease, unspecified: Secondary | ICD-10-CM | POA: Insufficient documentation

## 2020-12-07 DIAGNOSIS — N189 Chronic kidney disease, unspecified: Secondary | ICD-10-CM | POA: Insufficient documentation

## 2020-12-07 DIAGNOSIS — I5033 Acute on chronic diastolic (congestive) heart failure: Secondary | ICD-10-CM | POA: Insufficient documentation

## 2020-12-07 DIAGNOSIS — Z8673 Personal history of transient ischemic attack (TIA), and cerebral infarction without residual deficits: Secondary | ICD-10-CM | POA: Insufficient documentation

## 2020-12-07 DIAGNOSIS — Z7901 Long term (current) use of anticoagulants: Secondary | ICD-10-CM | POA: Insufficient documentation

## 2020-12-07 DIAGNOSIS — I890 Lymphedema, not elsewhere classified: Secondary | ICD-10-CM | POA: Insufficient documentation

## 2020-12-07 DIAGNOSIS — I779 Disorder of arteries and arterioles, unspecified: Secondary | ICD-10-CM | POA: Insufficient documentation

## 2020-12-07 DIAGNOSIS — K219 Gastro-esophageal reflux disease without esophagitis: Secondary | ICD-10-CM | POA: Insufficient documentation

## 2020-12-07 DIAGNOSIS — R058 Other specified cough: Secondary | ICD-10-CM | POA: Insufficient documentation

## 2020-12-07 DIAGNOSIS — Z87891 Personal history of nicotine dependence: Secondary | ICD-10-CM | POA: Insufficient documentation

## 2020-12-07 DIAGNOSIS — R140 Abdominal distension (gaseous): Secondary | ICD-10-CM | POA: Insufficient documentation

## 2020-12-07 DIAGNOSIS — Z79899 Other long term (current) drug therapy: Secondary | ICD-10-CM | POA: Insufficient documentation

## 2020-12-07 DIAGNOSIS — G479 Sleep disorder, unspecified: Secondary | ICD-10-CM | POA: Insufficient documentation

## 2020-12-07 DIAGNOSIS — I4811 Longstanding persistent atrial fibrillation: Secondary | ICD-10-CM

## 2020-12-07 DIAGNOSIS — E785 Hyperlipidemia, unspecified: Secondary | ICD-10-CM | POA: Insufficient documentation

## 2020-12-07 LAB — BASIC METABOLIC PANEL
Anion gap: 8 (ref 5–15)
BUN: 24 mg/dL — ABNORMAL HIGH (ref 8–23)
CO2: 31 mmol/L (ref 22–32)
Calcium: 9 mg/dL (ref 8.9–10.3)
Chloride: 96 mmol/L — ABNORMAL LOW (ref 98–111)
Creatinine, Ser: 1.12 mg/dL (ref 0.61–1.24)
GFR, Estimated: >60 mL/min (ref >60)
Glucose, Bld: 111 mg/dL — ABNORMAL HIGH (ref 70–99)
Potassium: 5 mmol/L (ref 3.5–5.1)
Sodium: 135 mmol/L (ref 135–145)

## 2020-12-07 LAB — BRAIN NATRIURETIC PEPTIDE: B Natriuretic Peptide: 574.3 pg/mL — ABNORMAL HIGH (ref 0.0–100.0)

## 2020-12-07 MED ORDER — TORSEMIDE 20 MG PO TABS: 40.0000 mg | ORAL_TABLET | Freq: Every day | ORAL | 3 refills | Status: DC

## 2020-12-07 MED ORDER — POTASSIUM CHLORIDE CRYS ER 20 MEQ PO TBCR: 40.0000 meq | EXTENDED_RELEASE_TABLET | Freq: Once | ORAL | Status: AC

## 2020-12-07 MED ORDER — POTASSIUM CHLORIDE CRYS ER 20 MEQ PO TBCR: 20.0000 meq | EXTENDED_RELEASE_TABLET | Freq: Every day | ORAL | 5 refills | Status: DC

## 2020-12-07 MED ORDER — FUROSEMIDE 10 MG/ML IJ SOLN
INTRAMUSCULAR | Status: AC
  Administered 2020-12-07: 11:00:00 80 mg via INTRAVENOUS
  Filled 2020-12-07: qty 8

## 2020-12-07 MED ORDER — FUROSEMIDE 10 MG/ML IJ SOLN: 80.0000 mg | Freq: Once | INTRAMUSCULAR | Status: AC

## 2020-12-07 MED ORDER — POTASSIUM CHLORIDE CRYS ER 20 MEQ PO TBCR
EXTENDED_RELEASE_TABLET | ORAL | Status: AC
  Administered 2020-12-07: 11:00:00 40 meq via ORAL
  Filled 2020-12-07: qty 2

## 2020-12-07 NOTE — Patient Instructions (Addendum)
Continue weighing daily and call for an overnight weight gain of > 2 pounds or a weekly weight gain of >5 pounds.    Call [facility] pulmonology at [phone]   Start taking torsemide tomorrow as 2 tablets every morning. Will also start potassium 1 tablet every morning.

## 2020-12-07 NOTE — Telephone Encounter (Signed)
Spoke with granddaughter regarding lab results obtained earlier today when he received 80mg  IV lasix and 110meq oral potassium.   Since potassium level is 5.0, instructed her to not give him anymore OTC potassium nor start the prescription potassium that was sent in earlier today. Will recheck labs in 2 days before starting anymore potassium supplementation. She verbalized understanding.

## 2020-12-08 DIAGNOSIS — J439 Emphysema, unspecified: Secondary | ICD-10-CM | POA: Diagnosis not present

## 2020-12-08 DIAGNOSIS — N2889 Other specified disorders of kidney and ureter: Secondary | ICD-10-CM | POA: Diagnosis not present

## 2020-12-08 DIAGNOSIS — J449 Chronic obstructive pulmonary disease, unspecified: Secondary | ICD-10-CM | POA: Diagnosis not present

## 2020-12-08 DIAGNOSIS — I482 Chronic atrial fibrillation, unspecified: Secondary | ICD-10-CM | POA: Diagnosis not present

## 2020-12-08 DIAGNOSIS — I5033 Acute on chronic diastolic (congestive) heart failure: Secondary | ICD-10-CM | POA: Diagnosis not present

## 2020-12-08 DIAGNOSIS — R0602 Shortness of breath: Secondary | ICD-10-CM | POA: Diagnosis not present

## 2020-12-08 DIAGNOSIS — J9601 Acute respiratory failure with hypoxia: Secondary | ICD-10-CM | POA: Diagnosis not present

## 2020-12-08 DIAGNOSIS — E8770 Fluid overload, unspecified: Secondary | ICD-10-CM | POA: Diagnosis not present

## 2020-12-08 DIAGNOSIS — R030 Elevated blood-pressure reading, without diagnosis of hypertension: Secondary | ICD-10-CM | POA: Diagnosis not present

## 2020-12-08 DIAGNOSIS — I081 Rheumatic disorders of both mitral and tricuspid valves: Secondary | ICD-10-CM | POA: Diagnosis not present

## 2020-12-08 DIAGNOSIS — R109 Unspecified abdominal pain: Secondary | ICD-10-CM | POA: Diagnosis not present

## 2020-12-08 DIAGNOSIS — J9621 Acute and chronic respiratory failure with hypoxia: Secondary | ICD-10-CM | POA: Diagnosis not present

## 2020-12-08 DIAGNOSIS — R9389 Abnormal findings on diagnostic imaging of other specified body structures: Secondary | ICD-10-CM | POA: Diagnosis not present

## 2020-12-08 DIAGNOSIS — A498 Other bacterial infections of unspecified site: Secondary | ICD-10-CM | POA: Diagnosis not present

## 2020-12-08 DIAGNOSIS — J189 Pneumonia, unspecified organism: Secondary | ICD-10-CM | POA: Diagnosis not present

## 2020-12-08 DIAGNOSIS — I509 Heart failure, unspecified: Secondary | ICD-10-CM | POA: Diagnosis not present

## 2020-12-08 DIAGNOSIS — R079 Chest pain, unspecified: Secondary | ICD-10-CM | POA: Diagnosis not present

## 2020-12-08 DIAGNOSIS — D649 Anemia, unspecified: Secondary | ICD-10-CM | POA: Diagnosis not present

## 2020-12-08 DIAGNOSIS — I083 Combined rheumatic disorders of mitral, aortic and tricuspid valves: Secondary | ICD-10-CM | POA: Diagnosis not present

## 2020-12-08 DIAGNOSIS — Z1612 Extended spectrum beta lactamase (ESBL) resistance: Secondary | ICD-10-CM | POA: Diagnosis not present

## 2020-12-08 DIAGNOSIS — J42 Unspecified chronic bronchitis: Secondary | ICD-10-CM | POA: Diagnosis not present

## 2020-12-08 DIAGNOSIS — R0902 Hypoxemia: Secondary | ICD-10-CM | POA: Diagnosis not present

## 2020-12-08 DIAGNOSIS — R9431 Abnormal electrocardiogram [ECG] [EKG]: Secondary | ICD-10-CM | POA: Diagnosis not present

## 2020-12-08 DIAGNOSIS — I2510 Atherosclerotic heart disease of native coronary artery without angina pectoris: Secondary | ICD-10-CM | POA: Diagnosis not present

## 2020-12-08 DIAGNOSIS — E782 Mixed hyperlipidemia: Secondary | ICD-10-CM | POA: Diagnosis not present

## 2020-12-08 DIAGNOSIS — Z20822 Contact with and (suspected) exposure to covid-19: Secondary | ICD-10-CM | POA: Diagnosis not present

## 2020-12-08 DIAGNOSIS — Z9981 Dependence on supplemental oxygen: Secondary | ICD-10-CM | POA: Diagnosis not present

## 2020-12-08 DIAGNOSIS — J8410 Pulmonary fibrosis, unspecified: Secondary | ICD-10-CM | POA: Diagnosis not present

## 2020-12-08 DIAGNOSIS — Z7901 Long term (current) use of anticoagulants: Secondary | ICD-10-CM | POA: Diagnosis not present

## 2020-12-08 NOTE — Progress Notes (Deleted)
Patient ID: [name] Browning, male    DOB: October 18, 1940, 80 y.o.   MRN: [id number]  HPI  [name] Browning is a 80 y/o male with a history of carotid disease, CAD, hyperlipidemia, HTN, CKD, stroke, anxiety, atrial fibrillation, COPD, DVT, GERD, previous tobacco use and chronic heart failure.   Echo report from 11/15/20 reviewed and showed an EF of 60-65% along with severe LAE and mild [name].   LHC done 11/14/20 showed: Ost RCA lesion is 95% stenosed.   Prox RCA to Dist RCA lesion is 100% stenosed. -Very minimal flow.  After initial angiography, it disappeared to progressed to the ostium.   Left-to-right collaterals via septal perforators fill the PDA system.   Mid LM to Dist LM lesion is 20% stenosed.   Ost LAD to Prox LAD lesion is 25% stenosed.  Relatively small caliber vessel that wraps the apex.   Diminutive LCx with moderate sized OM1, mild disease.   The left ventricular systolic function is normal. No obvious regional wall motion normalities.   The left ventricular ejection fraction is 55-65% by visual estimate.   LV end diastolic pressure is moderately elevated.  18 mmHg   There is no aortic valve stenosis.  Admitted 11/11/20 due to shortness of breath due to acute on chronic HF. Initially treated with IV lasix with transition to oral diuretics. Elevated troponin. Eliquis held due to GIB. GI and cardiology consults obtained. Started on IV protonix. Developed cardiogenic shock and treated with levophed. Renal function worsened due to dehydration and contrast dye but improved with IV hydration. Antibiotics started due to UTI. Discharged after 21 days. Admitted twice in October.   He presents today for a follow-up visit with a chief complaint of   Received 80mg  IV lasix/ 4meq PO potassium 2 days ago.   Saw pulmonology a few months ago and continues to cough up blood tinged sputum. He says that he doesn't want to see that same pulmonologist and granddaughter says that she will call [facility] and  get him established over there. Granddaughter has been giving patient OTC potassium because his last potassium level was low.   Past Medical History:  Diagnosis Date   Anginal pain ([place])    Anxiety    Aortic atherosclerosis ([place])    Atrial fibrillation ([place]) 01/2019   Bilateral carpal tunnel syndrome    CAD (coronary artery disease)    a.) LHC 2004 --> normal coronaries. b.) normal stress test in 2007 and 2011; c.) Lexiscan 05/20/2014 --> LVEF 55-65%; no significant stress induced ischemia/arrythmia. d.) CT chest 03/25/2019 --> coronaries carcified.   Carotid atherosclerosis, bilateral    Carpal tunnel syndrome, bilateral    CHF (congestive heart failure) (HCC)    Chronic anticoagulation    a.) ASA + apixaban   COPD (chronic obstructive pulmonary disease) (HCC)    CVA (cerebral vascular accident) ([place])    Degenerative disc disease, cervical    Diastolic dysfunction    a.) TTE 05/29/2014 --> LVEF 60-65%; G1DD.   DVT (deep venous thrombosis) (HCC)    GERD (gastroesophageal reflux disease)    History of 2019 novel coronavirus disease (COVID-19) 02/08/2019   HLD (hyperlipidemia)    Hypertension    Kidney stones    Osteoarthritis of right shoulder    Pneumonia    Respiratory failure, acute ([place]) 09/20/2020   a.) severe respiratory distress 1 hour after urological surgery. CXR (+) for acute pulmonary edema. Transferred to ICU and placed on NIPPV. Questionable aspiration PNA. (+) A.fib with  RVR. Improved with ABX, diuresis, and amiodarone.   Skin cancer of face    a.) RIGHT ear and RIGHT forehead; excised.   Syncope    TIA (transient ischemic attack) 2016   Valvular regurgitation    a.) TTE 05/26/2014 --> LVEF 60-65%; trivial [name], mild TR; no AR or PR. b.) TTE 04/20/2015 --> LVEF 55-60%; trivial [name] and PR; no AR or TR.   Past Surgical History:  Procedure Laterality Date   CARDIAC CATHETERIZATION  2004   CARPAL TUNNEL RELEASE Right 06/11/2013   CENTRAL LINE INSERTION  11/14/2020    Procedure: CENTRAL LINE INSERTION;  Surgeon: Leonie Man, MD;  Location: [place] [facility];  Service: Cardiovascular;;   CORONARY/GRAFT ACUTE MI REVASCULARIZATION N/A 11/14/2020   Procedure: Coronary/Graft Acute MI Revascularization;  Surgeon: Leonie Man, MD;  Location: [place] [facility];  Service: Cardiovascular;  Laterality: N/A;   CYSTOSCOPY W/ RETROGRADES  10/16/2020   Procedure: CYSTOSCOPY WITH RETROGRADE PYELOGRAM;  Surgeon: Billey Co, MD;  Location: [facility];  Service: Urology;;   CYSTOSCOPY W/ URETERAL STENT PLACEMENT Left 09/20/2020   Procedure: CYSTOSCOPY WITH RETROGRADE PYELOGRAM/URETERAL STENT PLACEMENT;  Surgeon: Janith Lima, MD;  Location: [facility];  Service: Urology;  Laterality: Left;   CYSTOSCOPY/URETEROSCOPY/HOLMIUM LASER/STENT PLACEMENT Left 10/16/2020   Procedure: CYSTOSCOPY/URETEROSCOPY/HOLMIUM LASER/STENT PLACEMENT;  Surgeon: Billey Co, MD;  Location: [facility];  Service: Urology;  Laterality: Left;   ESOPHAGOGASTRODUODENOSCOPY N/A 11/06/2020   Procedure: ESOPHAGOGASTRODUODENOSCOPY (EGD);  Surgeon: Lucilla Lame, MD;  Location: [facility];  Service: Endoscopy;  Laterality: N/A;   LEFT HEART CATH AND CORONARY ANGIOGRAPHY N/A 11/14/2020   Procedure: LEFT HEART CATH AND CORONARY ANGIOGRAPHY;  Surgeon: Leonie Man, MD;  Location: [place] [facility];  Service: Cardiovascular;  Laterality: N/A;   SHOULDER ARTHROSCOPY WITH OPEN ROTATOR CUFF REPAIR Right 08/24/2017   Procedure: SHOULDER ARTHROSCOPY WITH OPEN ROTATOR CUFF REPAIR;  Surgeon: Corky Mull, MD;  Location: [facility];  Service: Orthopedics;  Laterality: Right;   SKIN CANCER EXCISION  12/01/2016   right ear    SKIN CANCER EXCISION     remove from the right side of the face    THROMBECTOMY Right 2004   leg   THYROIDECTOMY  1950   Not sure if total or partial thyroidectomy.   Family History  Problem Relation Age of Onset   Hypertension Mother    Heart disease Mother    CAD Father     Heart attack Father    Social History   Tobacco Use   Smoking status: Former    Packs/day: 1.00    Years: 46.00    Pack years: 46.00    Types: Cigarettes    Start date: 01/10/1957    Quit date: 02/11/2003    Years since quitting: 17.8   Smokeless tobacco: Former    Types: Snuff    Quit date: 02/11/2003  Substance Use Topics   Alcohol use: Not Currently    Alcohol/week: 7.0 standard drinks    Types: 7 Cans of beer per week   Allergies  Allergen Reactions   Hydromorphone     Hallucination Pt reports he doesn't know about this     Review of Systems  Constitutional:  Positive for appetite change and fatigue (easily).  HENT:  Negative for congestion, rhinorrhea and sore throat.   Eyes: Negative.   Respiratory:  Positive for cough (productive with slight blod tinged), shortness of breath (easily) and wheezing.   Cardiovascular:  Positive for chest  pain (at times), palpitations and leg swelling.  Gastrointestinal:  Positive for abdominal distention. Negative for abdominal pain.  Endocrine: Negative.   Genitourinary: Negative.   Musculoskeletal:  Negative for back pain and neck pain.  Skin: Negative.   Allergic/Immunologic: Negative.   Neurological:  Positive for light-headedness. Negative for dizziness.  Hematological:  Negative for adenopathy. Bruises/bleeds easily.  Psychiatric/Behavioral:  Positive for sleep disturbance (sleeping in recliner). Negative for dysphoric mood. The patient is nervous/anxious.      Physical Exam Vitals and nursing note reviewed. Exam conducted with a chaperone present (granddaughter ([name] Browning)).  Constitutional:      Appearance: Normal appearance.  HENT:     Head: Normocephalic and atraumatic.  Cardiovascular:     Rate and Rhythm: Normal rate. Rhythm irregular.  Pulmonary:     Effort: Prolonged expiration present.     Breath sounds: Examination of the right-lower field reveals rales. Examination of the left-lower field reveals rales. Rales  present. No wheezing or rhonchi.  Abdominal:     General: There is distension.  Musculoskeletal:        General: Tenderness (to palpation) present.     Cervical back: Normal range of motion and neck supple.     Right lower leg: Edema (2+ pitting) present.     Left lower leg: Edema (2+ pitting) present.  Skin:    General: Skin is warm and dry.  Neurological:     General: No focal deficit present.     Mental Status: He is alert and oriented to person, place, and time.  Psychiatric:        Mood and Affect: Mood is anxious.        Behavior: Behavior normal.    Assessment & Plan:  1: Acute on Chronic heart failure with preserved ejection fraction with structural changes (LAE)- - NYHA class III - fluid overloaded today with pedal edema, abdominal distention, weight gain and worsening shortness of breath - weighing daily; reminded to call for an overnight weight gain of > 2 pounds or a weekly weight gain of > 5 pounds - weight 187.6 from last visit here 2 days ago - received 80mg  IV lasix/ 64meq PO potassium 2 days ago as well as changed his diuretic to torsemide - will draw BMP today - they are going to discuss with PCP something for his anxiety as they recognize that his anxiety makes his breathing worse - BNP 12/07/20 was 574.3 which is elevated from previous result 3 weeks ago  2: HTN- - BP  - saw PCP ([place]) & has f/u either later today or tomorrow - BMP 12/07/20 reviewed and showed sodium 135, potassium 5.0, creatinine 1.12 and GFR >60  3: COPD- - saw pulmonology Melvyn Novas) 10/06/20 but doesn't want to return to him - discussed other options and the granddaughter says that they will call [facility] to get established with pulmonology there  - wearing oxygen at 4L now and previously was only wearing it at 2L; occasionally has to increase it to 5L  4: Atrial fibrillation- - saw cardiology ([facility]) 05/04/20; returns 12/16/20 - coughing up blood tinged sputum, advised to  call cardiology to discuss eliquis usage if this persists   5: Lymphedema- - stage 2 - wearing compression socks daily - elevating legs during the day but edema persists - limited in his ability to exercise due to symptoms - changing diuretic per above - consider compression boots if edema persists   Patient did not bring his medications nor a list.  Each medication was verbally reviewed with the patient and he was encouraged to bring the bottles to every visit to confirm accuracy of list.

## 2020-12-09 ENCOUNTER — Telehealth: Payer: Self-pay | Admitting: Family

## 2020-12-09 ENCOUNTER — Ambulatory Visit: Payer: Medicare HMO | Admitting: Family

## 2020-12-09 NOTE — Telephone Encounter (Signed)
LVM with patient after he missed his follow up appointment for today and for a redraw on labs after we sent patient for IV lasix and blood work on Monday!  Bela Nyborg, NT

## 2020-12-14 ENCOUNTER — Encounter (HOSPITAL_COMMUNITY): Payer: Self-pay | Admitting: Radiology

## 2020-12-18 ENCOUNTER — Telehealth: Payer: Self-pay | Admitting: Family

## 2020-12-18 ENCOUNTER — Encounter: Payer: Self-pay | Admitting: Family

## 2020-12-18 ENCOUNTER — Other Ambulatory Visit: Payer: Self-pay

## 2020-12-18 ENCOUNTER — Ambulatory Visit: Payer: Medicare HMO | Attending: Family | Admitting: Family

## 2020-12-18 VITALS — BP 109/56 | HR 86 | Resp 16 | Ht 74.0 in | Wt 173.5 lb

## 2020-12-18 DIAGNOSIS — Z8673 Personal history of transient ischemic attack (TIA), and cerebral infarction without residual deficits: Secondary | ICD-10-CM | POA: Diagnosis not present

## 2020-12-18 DIAGNOSIS — N189 Chronic kidney disease, unspecified: Secondary | ICD-10-CM | POA: Diagnosis not present

## 2020-12-18 DIAGNOSIS — J449 Chronic obstructive pulmonary disease, unspecified: Secondary | ICD-10-CM

## 2020-12-18 DIAGNOSIS — I10 Essential (primary) hypertension: Secondary | ICD-10-CM | POA: Diagnosis not present

## 2020-12-18 DIAGNOSIS — Z87891 Personal history of nicotine dependence: Secondary | ICD-10-CM | POA: Diagnosis not present

## 2020-12-18 DIAGNOSIS — I5032 Chronic diastolic (congestive) heart failure: Secondary | ICD-10-CM | POA: Insufficient documentation

## 2020-12-18 DIAGNOSIS — I2510 Atherosclerotic heart disease of native coronary artery without angina pectoris: Secondary | ICD-10-CM | POA: Diagnosis not present

## 2020-12-18 DIAGNOSIS — I4811 Longstanding persistent atrial fibrillation: Secondary | ICD-10-CM | POA: Diagnosis not present

## 2020-12-18 DIAGNOSIS — I130 Hypertensive heart and chronic kidney disease with heart failure and stage 1 through stage 4 chronic kidney disease, or unspecified chronic kidney disease: Secondary | ICD-10-CM | POA: Diagnosis not present

## 2020-12-18 DIAGNOSIS — Z9981 Dependence on supplemental oxygen: Secondary | ICD-10-CM | POA: Diagnosis not present

## 2020-12-18 DIAGNOSIS — Z86718 Personal history of other venous thrombosis and embolism: Secondary | ICD-10-CM | POA: Insufficient documentation

## 2020-12-18 DIAGNOSIS — I890 Lymphedema, not elsewhere classified: Secondary | ICD-10-CM

## 2020-12-18 DIAGNOSIS — I4891 Unspecified atrial fibrillation: Secondary | ICD-10-CM | POA: Insufficient documentation

## 2020-12-18 DIAGNOSIS — K219 Gastro-esophageal reflux disease without esophagitis: Secondary | ICD-10-CM | POA: Diagnosis not present

## 2020-12-18 LAB — BASIC METABOLIC PANEL
Anion gap: 9 (ref 5–15)
BUN: 20 mg/dL (ref 8–23)
CO2: 30 mmol/L (ref 22–32)
Calcium: 9.6 mg/dL (ref 8.9–10.3)
Chloride: 98 mmol/L (ref 98–111)
Creatinine, Ser: 1.12 mg/dL (ref 0.61–1.24)
GFR, Estimated: >60 mL/min (ref >60)
Glucose, Bld: 134 mg/dL — ABNORMAL HIGH (ref 70–99)
Potassium: 3.9 mmol/L (ref 3.5–5.1)
Sodium: 137 mmol/L (ref 135–145)

## 2020-12-18 MED ORDER — POTASSIUM CHLORIDE CRYS ER 20 MEQ PO TBCR: 20.0000 meq | EXTENDED_RELEASE_TABLET | Freq: Every day | ORAL | 5 refills | Status: DC

## 2020-12-18 NOTE — Telephone Encounter (Signed)
Spoke with patient's daughter [name] Browning about lab work obtained earlier today. Potassium and renal function are normal. However, patient is taking 120mg  furosemide daily and is not taking any potassium supplementation.   Will start potassium 84meq once daily and will recheck labs at next visit.

## 2020-12-18 NOTE — Patient Instructions (Signed)
Continue weighing daily and call for an overnight weight gain of 3 pounds or more or a weekly weight gain of more than 5 pounds.  °

## 2020-12-18 NOTE — Progress Notes (Signed)
Patient ID: [name] Browning, male    DOB: 06/09/1940, 80 y.o.   MRN: [id number]  HPI  Mr Woon is a 80 y/o male with a history of carotid disease, CAD, hyperlipidemia, HTN, CKD, stroke, anxiety, atrial fibrillation, COPD, DVT, GERD, previous tobacco use and chronic heart failure.   Echo report from 11/15/20 reviewed and showed an EF of 60-65% along with severe LAE and mild MR.   LHC done 11/14/20 showed: Ost RCA lesion is 95% stenosed.   Prox RCA to Dist RCA lesion is 100% stenosed. -Very minimal flow.  After initial angiography, it disappeared to progressed to the ostium.   Left-to-right collaterals via septal perforators fill the PDA system.   Mid LM to Dist LM lesion is 20% stenosed.   Ost LAD to Prox LAD lesion is 25% stenosed.  Relatively small caliber vessel that wraps the apex.   Diminutive LCx with moderate sized OM1, mild disease.   The left ventricular systolic function is normal. No obvious regional wall motion normalities.   The left ventricular ejection fraction is 55-65% by visual estimate.   LV end diastolic pressure is moderately elevated.  18 mmHg   There is no aortic valve stenosis.  Recent admission at [facility]. Admitted 11/11/20 due to shortness of breath due to acute on chronic HF. Initially treated with IV lasix with transition to oral diuretics. Elevated troponin. Eliquis held due to GIB. GI and cardiology consults obtained. Started on IV protonix. Developed cardiogenic shock and treated with levophed. Renal function worsened due to dehydration and contrast dye but improved with IV hydration. Antibiotics started due to UTI. Discharged after 21 days. Admitted twice in October.   He presents today for a follow-up visit with a chief complaint of moderate fatigue upon minimal exertion. He describes this as chronic in nature having been present for several years. He has associated shortness of breath (improving), chest pain, light-headedness and chronic difficulty sleeping. He  denies any abdominal distention, palpitations, pedal edema, chest pain, wheezing, cough or weight gain.   Says that he's had a recent admission of [facility] and spent >1 week admitted and feels much better since that admission.   Currently not taking any potassium supplements.   Past Medical History:  Diagnosis Date   Anginal pain ([place])    Anxiety    Aortic atherosclerosis ([place])    Atrial fibrillation ([place]) 01/2019   Bilateral carpal tunnel syndrome    CAD (coronary artery disease)    a.) LHC 2004 --> normal coronaries. b.) normal stress test in 2007 and 2011; c.) Lexiscan 05/20/2014 --> LVEF 55-65%; no significant stress induced ischemia/arrythmia. d.) CT chest 03/25/2019 --> coronaries carcified.   Carotid atherosclerosis, bilateral    Carpal tunnel syndrome, bilateral    CHF (congestive heart failure) (HCC)    Chronic anticoagulation    a.) ASA + apixaban   COPD (chronic obstructive pulmonary disease) (HCC)    CVA (cerebral vascular accident) ([place])    Degenerative disc disease, cervical    Diastolic dysfunction    a.) TTE 05/29/2014 --> LVEF 60-65%; G1DD.   DVT (deep venous thrombosis) (HCC)    GERD (gastroesophageal reflux disease)    History of 2019 novel coronavirus disease (COVID-19) 02/08/2019   HLD (hyperlipidemia)    Hypertension    Kidney stones    Osteoarthritis of right shoulder    Pneumonia    Respiratory failure, acute ([place]) 09/20/2020   a.) severe respiratory distress 1 hour after urological surgery. CXR (+) for  acute pulmonary edema. Transferred to ICU and placed on NIPPV. Questionable aspiration PNA. (+) A.fib with RVR. Improved with ABX, diuresis, and amiodarone.   Skin cancer of face    a.) RIGHT ear and RIGHT forehead; excised.   Syncope    TIA (transient ischemic attack) 2016   Valvular regurgitation    a.) TTE 05/26/2014 --> LVEF 60-65%; trivial MR, mild TR; no AR or PR. b.) TTE 04/20/2015 --> LVEF 55-60%; trivial MR and PR; no AR or TR.   Past Surgical  History:  Procedure Laterality Date   CARDIAC CATHETERIZATION  2004   CARPAL TUNNEL RELEASE Right 06/11/2013   CENTRAL LINE INSERTION  11/14/2020   Procedure: CENTRAL LINE INSERTION;  Surgeon: Leonie Man, MD;  Location: [place] [facility];  Service: Cardiovascular;;   CORONARY/GRAFT ACUTE MI REVASCULARIZATION N/A 11/14/2020   Procedure: Coronary/Graft Acute MI Revascularization;  Surgeon: Leonie Man, MD;  Location: [place] [facility];  Service: Cardiovascular;  Laterality: N/A;   CYSTOSCOPY W/ RETROGRADES  10/16/2020   Procedure: CYSTOSCOPY WITH RETROGRADE PYELOGRAM;  Surgeon: Billey Co, MD;  Location: [facility];  Service: Urology;;   CYSTOSCOPY W/ URETERAL STENT PLACEMENT Left 09/20/2020   Procedure: CYSTOSCOPY WITH RETROGRADE PYELOGRAM/URETERAL STENT PLACEMENT;  Surgeon: Janith Lima, MD;  Location: [facility];  Service: Urology;  Laterality: Left;   CYSTOSCOPY/URETEROSCOPY/HOLMIUM LASER/STENT PLACEMENT Left 10/16/2020   Procedure: CYSTOSCOPY/URETEROSCOPY/HOLMIUM LASER/STENT PLACEMENT;  Surgeon: Billey Co, MD;  Location: [facility];  Service: Urology;  Laterality: Left;   ESOPHAGOGASTRODUODENOSCOPY N/A 11/06/2020   Procedure: ESOPHAGOGASTRODUODENOSCOPY (EGD);  Surgeon: Lucilla Lame, MD;  Location: [facility];  Service: Endoscopy;  Laterality: N/A;   LEFT HEART CATH AND CORONARY ANGIOGRAPHY N/A 11/14/2020   Procedure: LEFT HEART CATH AND CORONARY ANGIOGRAPHY;  Surgeon: Leonie Man, MD;  Location: [place] [facility];  Service: Cardiovascular;  Laterality: N/A;   SHOULDER ARTHROSCOPY WITH OPEN ROTATOR CUFF REPAIR Right 08/24/2017   Procedure: SHOULDER ARTHROSCOPY WITH OPEN ROTATOR CUFF REPAIR;  Surgeon: Corky Mull, MD;  Location: [facility];  Service: Orthopedics;  Laterality: Right;   SKIN CANCER EXCISION  12/01/2016   right ear    SKIN CANCER EXCISION     remove from the right side of the face    THROMBECTOMY Right 2004   leg   THYROIDECTOMY  1950   Not  sure if total or partial thyroidectomy.   Family History  Problem Relation Age of Onset   Hypertension Mother    Heart disease Mother    CAD Father    Heart attack Father    Social History   Tobacco Use   Smoking status: Former    Packs/day: 1.00    Years: 46.00    Pack years: 46.00    Types: Cigarettes    Start date: 01/10/1957    Quit date: 02/11/2003    Years since quitting: 17.8   Smokeless tobacco: Former    Types: Snuff    Quit date: 02/11/2003  Substance Use Topics   Alcohol use: Not Currently    Alcohol/week: 7.0 standard drinks    Types: 7 Cans of beer per week   Allergies  Allergen Reactions   Hydromorphone     Hallucination Pt reports he doesn't know about this   Prior to Admission medications   Medication Sig Start Date End Date Taking? Authorizing Provider  acetaminophen (TYLENOL) 500 MG tablet Take 2 tablets (1,000 mg total) by mouth every 6 (six) hours as needed for mild pain.  05/07/20  Yes Piscoya, Jacqulyn Bath, MD  albuterol (VENTOLIN HFA) 108 (90 Base) MCG/ACT inhaler Inhale into the lungs every 6 (six) hours as needed for wheezing or shortness of breath.   Yes [provider]  aspirin EC 81 MG tablet Take 81 mg by mouth daily after supper.   Yes [provider]  atorvastatin (LIPITOR) 40 MG tablet Take 40 mg by mouth daily after supper.   Yes [provider]  cetirizine (ZYRTEC) 10 MG tablet Take 10 mg by mouth daily. 09/29/20  Yes [provider]  docusate sodium (COLACE) 100 MG capsule Take 1 capsule (100 mg total) by mouth 2 (two) times daily. 12/02/20 12/02/21 Yes Lavina Hamman, MD  famotidine (PEPCID) 20 MG tablet One after supper 10/06/20  Yes Tanda Rockers, MD  Ferrous Gluconate (IRON) 240 (27 Fe) MG TABS Take 27 mg by mouth daily after supper. 09/21/18  Yes [provider]  furosemide (LASIX) 40 MG tablet Take 40 mg by mouth 2 (two) times daily. 2 tablets in the morning, and one tablet in the afternoon   Yes  [provider]  lactulose (CHRONULAC) 10 GM/15ML solution Take 30 mLs (20 g total) by mouth 2 (two) times daily as needed for moderate constipation. 12/02/20  Yes Lavina Hamman, MD  metoprolol tartrate (LOPRESSOR) 25 MG tablet Take 25 mg by mouth 2 (two) times daily.   Yes [provider]  montelukast (SINGULAIR) 10 MG tablet Take 10 mg by mouth at bedtime.   Yes [provider]  pantoprazole (PROTONIX) 40 MG tablet Take 1 tablet (40 mg total) by mouth 2 (two) times daily. 12/02/20  Yes Lavina Hamman, MD  phenazopyridine (PYRIDIUM) 200 MG tablet Take 1 tablet (200 mg total) by mouth 3 (three) times daily as needed (bladder pain). 09/28/20  Yes Pokhrel, Laxman, MD  tamsulosin (FLOMAX) 0.4 MG CAPS capsule TAKE 1 CAPSULE BY MOUTH EVERY DAY 11/09/20  Yes Billey Co, MD  umeclidinium-vilanterol (ANORO ELLIPTA) 62.5-25 MCG/ACT AEPB Inhale 1 puff into the lungs daily.   Yes [provider]  apixaban (ELIQUIS) 5 MG TABS tablet Take 1 tablet (5 mg total) by mouth 2 (two) times daily. Patient not taking: Reported on 12/18/2020 09/28/20 12/07/20  Flora Lipps, MD  isosorbide mononitrate (IMDUR) 30 MG 24 hr tablet Take 0.5 tablets (15 mg total) by mouth daily. Patient not taking: Reported on 12/18/2020 12/03/20   Lavina Hamman, MD  polyethylene glycol (MIRALAX / GLYCOLAX) 17 g packet Take 17 g by mouth daily as needed for mild constipation or moderate constipation. Patient not taking: Reported on 12/18/2020 09/28/20   Pokhrel, Corrie Mckusick, MD  potassium chloride SA (KLOR-CON M) 20 MEQ tablet Take 1 tablet (20 mEq total) by mouth daily. 12/18/20   Alisa Graff, FNP  SYMBICORT 160-4.5 MCG/ACT inhaler Inhale 2 puffs into the lungs 2 (two) times daily as needed (shortness of breath). Patient not taking: Reported on 12/18/2020 12/07/18   [provider]   Review of Systems  Constitutional:  Positive for appetite change and fatigue (easily).  HENT:  Negative for  congestion, rhinorrhea and sore throat.   Eyes: Negative.   Respiratory:  Positive for cough (productive) and shortness of breath (improving). Negative for wheezing.   Cardiovascular:  Negative for chest pain, palpitations and leg swelling.  Gastrointestinal:  Negative for abdominal distention and abdominal pain.  Endocrine: Negative.   Genitourinary: Negative.   Musculoskeletal:  Negative for back pain and neck pain.  Skin:  Negative.   Allergic/Immunologic: Negative.   Neurological:  Positive for light-headedness (with sudden position changes). Negative for dizziness.  Hematological:  Negative for adenopathy. Bruises/bleeds easily.  Psychiatric/Behavioral:  Positive for sleep disturbance (sleeping in recliner). Negative for dysphoric mood. The patient is not nervous/anxious.    Vitals:   12/18/20 1344  BP: (!) 109/56  Pulse: 86  Resp: 16  SpO2: 99%  Weight: 173 lb 8 oz (78.7 kg)  Height: 6\' 2"  (1.88 m)   Wt Readings from Last 3 Encounters:  12/18/20 173 lb 8 oz (78.7 kg)  12/07/20 187 lb 6 oz (85 kg)  12/02/20 184 lb 4.8 oz (83.6 kg)   Lab Results  Component Value Date   CREATININE 1.12 12/07/2020   CREATININE 0.95 12/05/2020   CREATININE 1.04 12/02/2020   Physical Exam Vitals and nursing note reviewed. Exam conducted with a chaperone present (daughter Baker Janus)).  Constitutional:      Appearance: Normal appearance.  HENT:     Head: Normocephalic and atraumatic.  Cardiovascular:     Rate and Rhythm: Normal rate. Rhythm irregular.  Pulmonary:     Breath sounds: No wheezing, rhonchi or rales.  Abdominal:     General: There is no distension.     Palpations: Abdomen is soft.  Musculoskeletal:        General: No tenderness.     Cervical back: Normal range of motion and neck supple.     Right lower leg: No edema.     Left lower leg: No edema.  Skin:    General: Skin is warm and dry.  Neurological:     General: No focal deficit present.     Mental Status: He is alert  and oriented to person, place, and time.  Psychiatric:        Mood and Affect: Mood is not anxious.        Behavior: Behavior normal.   Assessment & Plan:  1: Chronic heart failure with preserved ejection fraction with structural changes (LAE)- - NYHA class III - euvolemic today - weighing daily; reminded to call for an overnight weight gain of > 2 pounds or a weekly weight gain of > 5 pounds - weight down 14 pounds from last visit here 2 weeks ago - not adding salt to his food and trying to read food labels for sodium content - BP may not allow for the addition of GDMT of entresto and SGLT2 - BNP 12/07/20 was 574.3   2: HTN- - BP looks good (109/56) - saw PCP ([facility]) - BMP 12/07/20 reviewed and showed sodium 135, potassium 5.0, creatinine 1.12 and GFR >60 - will recheck labs today and then decide about whether potassium needs resumed  3: COPD- - saw pulmonology Melvyn Novas) 10/06/20 but doesn't want to return to him - discussed other options and the granddaughter says that they will call [facility] to get established with pulmonology there  - wearing oxygen at 4L now   4: Atrial fibrillation- - saw cardiology ([facility]ath) 05/04/20 but is planning on getting established back with Dr. Humphrey Rolls as he doesn't want to go to [facility] to see Dr. Ubaldo Glassing - no longer taking apixaban as he was coughing up blood which has since resolved  5: Lymphedema- - resolved   Medication list reviewed.   Return in 3 weeks or sooner for any questions/problems before then.

## 2020-12-24 ENCOUNTER — Other Ambulatory Visit: Payer: Self-pay

## 2020-12-24 ENCOUNTER — Other Ambulatory Visit: Payer: Self-pay | Admitting: Urology

## 2020-12-24 ENCOUNTER — Ambulatory Visit
Admission: RE | Admit: 2020-12-24 | Discharge: 2020-12-24 | Disposition: A | Discharge status: Home / Self Care | Payer: Medicare HMO | Source: Ambulatory Visit | Attending: Family | Admitting: Family

## 2020-12-24 ENCOUNTER — Encounter: Payer: Self-pay | Admitting: Family

## 2020-12-24 ENCOUNTER — Other Ambulatory Visit: Payer: Self-pay | Admitting: Family

## 2020-12-24 ENCOUNTER — Ambulatory Visit (HOSPITAL_BASED_OUTPATIENT_CLINIC_OR_DEPARTMENT_OTHER): Payer: Medicare HMO | Admitting: Family

## 2020-12-24 VITALS — BP 112/56 | HR 75 | Resp 16 | Ht 74.0 in | Wt 174.2 lb

## 2020-12-24 DIAGNOSIS — I5033 Acute on chronic diastolic (congestive) heart failure: Secondary | ICD-10-CM | POA: Insufficient documentation

## 2020-12-24 DIAGNOSIS — I10 Essential (primary) hypertension: Secondary | ICD-10-CM

## 2020-12-24 DIAGNOSIS — Z9981 Dependence on supplemental oxygen: Secondary | ICD-10-CM | POA: Insufficient documentation

## 2020-12-24 DIAGNOSIS — J449 Chronic obstructive pulmonary disease, unspecified: Secondary | ICD-10-CM | POA: Insufficient documentation

## 2020-12-24 DIAGNOSIS — R310 Gross hematuria: Secondary | ICD-10-CM

## 2020-12-24 DIAGNOSIS — I4811 Longstanding persistent atrial fibrillation: Secondary | ICD-10-CM | POA: Diagnosis not present

## 2020-12-24 DIAGNOSIS — N189 Chronic kidney disease, unspecified: Secondary | ICD-10-CM | POA: Insufficient documentation

## 2020-12-24 DIAGNOSIS — E785 Hyperlipidemia, unspecified: Secondary | ICD-10-CM | POA: Insufficient documentation

## 2020-12-24 DIAGNOSIS — Z87891 Personal history of nicotine dependence: Secondary | ICD-10-CM | POA: Insufficient documentation

## 2020-12-24 DIAGNOSIS — Z8673 Personal history of transient ischemic attack (TIA), and cerebral infarction without residual deficits: Secondary | ICD-10-CM | POA: Insufficient documentation

## 2020-12-24 DIAGNOSIS — I130 Hypertensive heart and chronic kidney disease with heart failure and stage 1 through stage 4 chronic kidney disease, or unspecified chronic kidney disease: Secondary | ICD-10-CM | POA: Insufficient documentation

## 2020-12-24 DIAGNOSIS — K219 Gastro-esophageal reflux disease without esophagitis: Secondary | ICD-10-CM | POA: Insufficient documentation

## 2020-12-24 DIAGNOSIS — I4891 Unspecified atrial fibrillation: Secondary | ICD-10-CM | POA: Insufficient documentation

## 2020-12-24 DIAGNOSIS — N201 Calculus of ureter: Secondary | ICD-10-CM

## 2020-12-24 DIAGNOSIS — F419 Anxiety disorder, unspecified: Secondary | ICD-10-CM | POA: Insufficient documentation

## 2020-12-24 DIAGNOSIS — Z86718 Personal history of other venous thrombosis and embolism: Secondary | ICD-10-CM | POA: Insufficient documentation

## 2020-12-24 DIAGNOSIS — Z79899 Other long term (current) drug therapy: Secondary | ICD-10-CM | POA: Insufficient documentation

## 2020-12-24 LAB — BASIC METABOLIC PANEL
Anion gap: 7 (ref 5–15)
BUN: 24 mg/dL — ABNORMAL HIGH (ref 8–23)
CO2: 32 mmol/L (ref 22–32)
Calcium: 9.1 mg/dL (ref 8.9–10.3)
Chloride: 99 mmol/L (ref 98–111)
Creatinine, Ser: 0.89 mg/dL (ref 0.61–1.24)
GFR, Estimated: >60 mL/min (ref >60)
Glucose, Bld: 110 mg/dL — ABNORMAL HIGH (ref 70–99)
Potassium: 3.8 mmol/L (ref 3.5–5.1)
Sodium: 138 mmol/L (ref 135–145)

## 2020-12-24 LAB — BRAIN NATRIURETIC PEPTIDE: B Natriuretic Peptide: 549.3 pg/mL — ABNORMAL HIGH (ref 0.0–100.0)

## 2020-12-24 MED ORDER — FUROSEMIDE 10 MG/ML IJ SOLN: 80.0000 mg | Freq: Once | INTRAMUSCULAR | Status: AC

## 2020-12-24 MED ORDER — FUROSEMIDE 10 MG/ML IJ SOLN
INTRAMUSCULAR | Status: AC
  Administered 2020-12-24: 80 mg via INTRAVENOUS
  Filled 2020-12-24: qty 8

## 2020-12-24 MED ORDER — POTASSIUM CHLORIDE CRYS ER 20 MEQ PO TBCR: 40.0000 meq | EXTENDED_RELEASE_TABLET | Freq: Once | ORAL | Status: AC

## 2020-12-24 MED ORDER — POTASSIUM CHLORIDE CRYS ER 20 MEQ PO TBCR
EXTENDED_RELEASE_TABLET | ORAL | Status: AC
  Administered 2020-12-24: 40 meq via ORAL
  Filled 2020-12-24: qty 2

## 2020-12-24 NOTE — Patient Instructions (Signed)
Continue weighing daily and call for an overnight weight gain of 3 pounds or more or a weekly weight gain of more than 5 pounds.  °

## 2020-12-24 NOTE — Progress Notes (Signed)
Patient ID: [name] Browning, male    DOB: [birth year], 80 y.o.   MRN: [id number]  HPI  Mr Lingelbach is a 80 y/o male with a history of carotid disease, CAD, hyperlipidemia, HTN, CKD, stroke, anxiety, atrial fibrillation, COPD, DVT, GERD, previous tobacco use and chronic heart failure.   Echo report from 11/15/20 reviewed and showed an EF of 60-65% along with severe LAE and mild MR.   LHC done 11/14/20 showed: Ost RCA lesion is 95% stenosed.   Prox RCA to Dist RCA lesion is 100% stenosed. -Very minimal flow.  After initial angiography, it disappeared to progressed to the ostium.   Left-to-right collaterals via septal perforators fill the PDA system.   Mid LM to Dist LM lesion is 20% stenosed.   Ost LAD to Prox LAD lesion is 25% stenosed.  Relatively small caliber vessel that wraps the apex.   Diminutive LCx with moderate sized OM1, mild disease.   The left ventricular systolic function is normal. No obvious regional wall motion normalities.   The left ventricular ejection fraction is 55-65% by visual estimate.   LV end diastolic pressure is moderately elevated.  18 mmHg   There is no aortic valve stenosis.  Recent admission at [facility]. Admitted 11/11/20 due to shortness of breath due to acute on chronic HF. Initially treated with IV lasix with transition to oral diuretics. Elevated troponin. Eliquis held due to GIB. GI and cardiology consults obtained. Started on IV protonix. Developed cardiogenic shock and treated with levophed. Renal function worsened due to dehydration and contrast dye but improved with IV hydration. Antibiotics started due to UTI. Discharged after 21 days. Admitted twice in October.   He presents today for a follow-up visit with a chief complaint of moderate fatigue with little exertion. He says that this is unchanged. He has associated cough, head congestion, shortness of breath (better), wheezing, palpitations, difficulty sleeping and weight gain along with this. He denies any  dizziness, abdominal distention, pedal edema or chest pain  Received 80mg  IV lasix/ 58meq PO potassium yesterday. Says that he feels like he's breathing better since getting the IV lasix but still feels congested. Said that last night when he took his oxygen off to go to the bathroom, his oxygen level dropped into the 70's. Recovered quickly after putting his oxygen back on.   By our scale, he's gained 3 pounds overnight.   Past Medical History:  Diagnosis Date   Anginal pain ([place])    Anxiety    Aortic atherosclerosis ([place])    Atrial fibrillation ([place]) 01/2019   Bilateral carpal tunnel syndrome    CAD (coronary artery disease)    a.) LHC 2004 --> normal coronaries. b.) normal stress test in 2007 and 2011; c.) Lexiscan 05/20/2014 --> LVEF 55-65%; no significant stress induced ischemia/arrythmia. d.) CT chest 03/25/2019 --> coronaries carcified.   Carotid atherosclerosis, bilateral    Carpal tunnel syndrome, bilateral    CHF (congestive heart failure) (HCC)    Chronic anticoagulation    a.) ASA + apixaban   COPD (chronic obstructive pulmonary disease) (HCC)    CVA (cerebral vascular accident) ([place])    Degenerative disc disease, cervical    Diastolic dysfunction    a.) TTE 05/29/2014 --> LVEF 60-65%; G1DD.   DVT (deep venous thrombosis) (HCC)    GERD (gastroesophageal reflux disease)    History of 2019 novel coronavirus disease (COVID-19) 02/08/2019   HLD (hyperlipidemia)    Hypertension    Kidney stones    Osteoarthritis  of right shoulder    Pneumonia    Respiratory failure, acute ([place]) 09/20/2020   a.) severe respiratory distress 1 hour after urological surgery. CXR (+) for acute pulmonary edema. Transferred to ICU and placed on NIPPV. Questionable aspiration PNA. (+) A.fib with RVR. Improved with ABX, diuresis, and amiodarone.   Skin cancer of face    a.) RIGHT ear and RIGHT forehead; excised.   Syncope    TIA (transient ischemic attack) 2016   Valvular regurgitation    a.) TTE  05/26/2014 --> LVEF 60-65%; trivial MR, mild TR; no AR or PR. b.) TTE 04/20/2015 --> LVEF 55-60%; trivial MR and PR; no AR or TR.   Past Surgical History:  Procedure Laterality Date   CARDIAC CATHETERIZATION  2004   CARPAL TUNNEL RELEASE Right 06/11/2013   CENTRAL LINE INSERTION  11/14/2020   Procedure: CENTRAL LINE INSERTION;  Surgeon: Leonie Man, MD;  Location: [place] [facility];  Service: Cardiovascular;;   CORONARY/GRAFT ACUTE MI REVASCULARIZATION N/A 11/14/2020   Procedure: Coronary/Graft Acute MI Revascularization;  Surgeon: Leonie Man, MD;  Location: [place] [facility];  Service: Cardiovascular;  Laterality: N/A;   CYSTOSCOPY W/ RETROGRADES  10/16/2020   Procedure: CYSTOSCOPY WITH RETROGRADE PYELOGRAM;  Surgeon: Billey Co, MD;  Location: [facility];  Service: Urology;;   CYSTOSCOPY W/ URETERAL STENT PLACEMENT Left 09/20/2020   Procedure: CYSTOSCOPY WITH RETROGRADE PYELOGRAM/URETERAL STENT PLACEMENT;  Surgeon: Janith Lima, MD;  Location: [facility];  Service: Urology;  Laterality: Left;   CYSTOSCOPY/URETEROSCOPY/HOLMIUM LASER/STENT PLACEMENT Left 10/16/2020   Procedure: CYSTOSCOPY/URETEROSCOPY/HOLMIUM LASER/STENT PLACEMENT;  Surgeon: Billey Co, MD;  Location: [facility];  Service: Urology;  Laterality: Left;   ESOPHAGOGASTRODUODENOSCOPY N/A 11/06/2020   Procedure: ESOPHAGOGASTRODUODENOSCOPY (EGD);  Surgeon: Lucilla Lame, MD;  Location: [facility];  Service: Endoscopy;  Laterality: N/A;   LEFT HEART CATH AND CORONARY ANGIOGRAPHY N/A 11/14/2020   Procedure: LEFT HEART CATH AND CORONARY ANGIOGRAPHY;  Surgeon: Leonie Man, MD;  Location: [place] [facility];  Service: Cardiovascular;  Laterality: N/A;   SHOULDER ARTHROSCOPY WITH OPEN ROTATOR CUFF REPAIR Right 08/24/2017   Procedure: SHOULDER ARTHROSCOPY WITH OPEN ROTATOR CUFF REPAIR;  Surgeon: Corky Mull, MD;  Location: [facility];  Service: Orthopedics;  Laterality: Right;   SKIN CANCER EXCISION  12/01/2016    right ear    SKIN CANCER EXCISION     remove from the right side of the face    THROMBECTOMY Right 2004   leg   THYROIDECTOMY  1950   Not sure if total or partial thyroidectomy.   Family History  Problem Relation Age of Onset   Hypertension Mother    Heart disease Mother    CAD Father    Heart attack Father    Social History   Tobacco Use   Smoking status: Former    Packs/day: 1.00    Years: 46.00    Pack years: 46.00    Types: Cigarettes    Start date: 01/10/1957    Quit date: 02/11/2003    Years since quitting: 17.8   Smokeless tobacco: Former    Types: Snuff    Quit date: 02/11/2003  Substance Use Topics   Alcohol use: Not Currently    Alcohol/week: 7.0 standard drinks    Types: 7 Cans of beer per week   Allergies  Allergen Reactions   Hydromorphone     Hallucination Pt reports he doesn't know about this   Prior to Admission medications   Medication Sig Start  Date End Date Taking? Authorizing Provider  acetaminophen (TYLENOL) 500 MG tablet Take 2 tablets (1,000 mg total) by mouth every 6 (six) hours as needed for mild pain. 05/07/20  Yes Piscoya, Jacqulyn Bath, MD  albuterol (VENTOLIN HFA) 108 (90 Base) MCG/ACT inhaler Inhale into the lungs every 6 (six) hours as needed for wheezing or shortness of breath.   Yes [provider]  apixaban (ELIQUIS) 5 MG TABS tablet Take 1 tablet (5 mg total) by mouth 2 (two) times daily. 09/28/20  Yes Pokhrel, Laxman, MD  aspirin EC 81 MG tablet Take 81 mg by mouth daily after supper.   Yes [provider]  atorvastatin (LIPITOR) 40 MG tablet Take 40 mg by mouth daily after supper.   Yes [provider]  cetirizine (ZYRTEC) 10 MG tablet Take 10 mg by mouth daily. 09/29/20  Yes [provider]  docusate sodium (COLACE) 100 MG capsule Take 1 capsule (100 mg total) by mouth 2 (two) times daily. 12/02/20 12/02/21 Yes Lavina Hamman, MD  famotidine (PEPCID) 20 MG tablet One after supper 10/06/20  Yes Tanda Rockers, MD   Ferrous Gluconate (IRON) 240 (27 Fe) MG TABS Take 27 mg by mouth daily after supper. 09/21/18  Yes [provider]  furosemide (LASIX) 40 MG tablet Take 40 mg by mouth 2 (two) times daily. 2 tablets in the morning, and one tablet in the afternoon   Yes [provider]  isosorbide mononitrate (IMDUR) 30 MG 24 hr tablet Take 0.5 tablets (15 mg total) by mouth daily. 12/03/20  Yes Lavina Hamman, MD  lactulose (CHRONULAC) 10 GM/15ML solution Take 30 mLs (20 g total) by mouth 2 (two) times daily as needed for moderate constipation. 12/02/20  Yes Lavina Hamman, MD  metoprolol tartrate (LOPRESSOR) 25 MG tablet Take 25 mg by mouth 2 (two) times daily.   Yes [provider]  montelukast (SINGULAIR) 10 MG tablet Take 10 mg by mouth at bedtime.   Yes [provider]  pantoprazole (PROTONIX) 40 MG tablet Take 1 tablet (40 mg total) by mouth 2 (two) times daily. 12/02/20  Yes Lavina Hamman, MD  phenazopyridine (PYRIDIUM) 200 MG tablet Take 1 tablet (200 mg total) by mouth 3 (three) times daily as needed (bladder pain). 09/28/20  Yes Pokhrel, Laxman, MD  polyethylene glycol (MIRALAX / GLYCOLAX) 17 g packet Take 17 g by mouth daily as needed for mild constipation or moderate constipation. 09/28/20  Yes Pokhrel, Laxman, MD  potassium chloride SA (KLOR-CON M) 20 MEQ tablet Take 1 tablet (20 mEq total) by mouth daily. 12/18/20  Yes Alisa Graff, FNP  SYMBICORT 160-4.5 MCG/ACT inhaler Inhale 2 puffs into the lungs 2 (two) times daily as needed (shortness of breath). 12/07/18  Yes [provider]  tamsulosin (FLOMAX) 0.4 MG CAPS capsule TAKE 1 CAPSULE BY MOUTH EVERY DAY 11/09/20  Yes Billey Co, MD  umeclidinium-vilanterol (ANORO ELLIPTA) 62.5-25 MCG/ACT AEPB Inhale 1 puff into the lungs daily.   Yes [provider]    Review of Systems  Constitutional:  Positive for appetite change and fatigue (easily).  HENT:  Positive for congestion. Negative for  rhinorrhea and sore throat.   Eyes: Negative.   Respiratory:  Positive for cough (productive), shortness of breath ("better") and wheezing. Negative for chest tightness.   Cardiovascular:  Positive for palpitations. Negative for chest pain and leg swelling.  Gastrointestinal:  Negative for abdominal distention and abdominal pain.  Endocrine: Negative.   Genitourinary: Negative.  Musculoskeletal:  Negative for back pain and neck pain.  Skin: Negative.   Allergic/Immunologic: Negative.   Neurological:  Negative for dizziness and light-headedness.  Hematological:  Negative for adenopathy. Bruises/bleeds easily.  Psychiatric/Behavioral:  Positive for sleep disturbance (sleeping in recliner). Negative for dysphoric mood. The patient is not nervous/anxious.    Vitals:   12/25/20 0849  BP: 138/63  Pulse: 82  Resp: 18  SpO2: 100%  Weight: 178 lb (80.7 kg)  Height: 6\' 2"  (1.88 m)   Wt Readings from Last 3 Encounters:  12/25/20 178 lb (80.7 kg)  12/24/20 174 lb 4 oz (79 kg)  12/18/20 173 lb 8 oz (78.7 kg)   Lab Results  Component Value Date   CREATININE 0.89 12/24/2020   CREATININE 1.12 12/18/2020   CREATININE 1.12 12/07/2020    Physical Exam Vitals and nursing note reviewed. Exam conducted with a chaperone present (son-in-law).  Constitutional:      Appearance: Normal appearance.  HENT:     Head: Normocephalic and atraumatic.  Cardiovascular:     Rate and Rhythm: Normal rate. Rhythm irregular.  Pulmonary:     Breath sounds: Rales (LLL) present. No wheezing or rhonchi.  Abdominal:     General: There is no distension.     Palpations: Abdomen is soft.  Musculoskeletal:     Cervical back: Normal range of motion and neck supple.     Right lower leg: Tenderness present. Edema (1+ pitting) present.     Left lower leg: Edema (trace pitting) present.  Skin:    General: Skin is warm and dry.  Neurological:     General: No focal deficit present.     Mental Status: He is alert and  oriented to person, place, and time.  Psychiatric:        Mood and Affect: Mood is not anxious.        Behavior: Behavior normal.   Assessment & Plan:  1: Acute on Chronic heart failure with preserved ejection fraction with structural changes (LAE)- - NYHA class III - fluid overloaded today with pedal edema, rales and overnight weight gain - weighing daily; reminded to call for an overnight weight gain of > 2 pounds or a weekly weight gain of > 5 pounds - weight up 3.6 pounds from last visit here yesterday - received 80mg  IV lasix/ 59meq PO potassium yesterday & feels like his shortness of breath is better; discussed doing IV lasix again or using metolazone - he would like to do the IV lasix again since he feels better and then next week discuss metolazone - will send for 80mg  IV lasix / 40 meq PO potassium again today - not adding salt to his food and trying to read food labels for sodium content - consider adding entresto at next visit  - BNP 12/24/20 was 549.3   2: HTN- - BP looks good (138/63) - saw PCP (Betances) - BMP 12/24/20 reviewed and showed sodium 138, potassium 3.8, creatinine 0.89 and GFR >60  3: COPD- - saw pulmonology Melvyn Novas) 10/06/20 but doesn't want to return to him - need to f/u to see if pulmonology appt has been scheduled - wearing oxygen at 4L now   4: Atrial fibrillation- - saw cardiology (Fath) 05/04/20 but is planning on getting established back with Dr. Humphrey Rolls as he doesn't want to go to [facility] to see Dr. Ubaldo Glassing - no longer taking apixaban as he was coughing up blood which has since resolved   Medication list reviewed.  Return in 3 days.

## 2020-12-24 NOTE — Progress Notes (Signed)
Patient ID: [name] Browning, male    DOB: 1940/06/01, 80 y.o.   MRN: [id number]  HPI  [name] Browning is a 80 y/o male with a history of carotid disease, CAD, hyperlipidemia, HTN, CKD, stroke, anxiety, atrial fibrillation, COPD, DVT, GERD, previous tobacco use and chronic heart failure.   Echo report from 11/15/20 reviewed and showed an EF of 60-65% along with severe LAE and mild [name].   LHC done 11/14/20 showed: Ost RCA lesion is 95% stenosed.   Prox RCA to Dist RCA lesion is 100% stenosed. -Very minimal flow.  After initial angiography, it disappeared to progressed to the ostium.   Left-to-right collaterals via septal perforators fill the PDA system.   Mid LM to Dist LM lesion is 20% stenosed.   Ost LAD to Prox LAD lesion is 25% stenosed.  Relatively small caliber vessel that wraps the apex.   Diminutive LCx with moderate sized OM1, mild disease.   The left ventricular systolic function is normal. No obvious regional wall motion normalities.   The left ventricular ejection fraction is 55-65% by visual estimate.   LV end diastolic pressure is moderately elevated.  18 mmHg   There is no aortic valve stenosis.  Recent admission at [facility]. Admitted 11/11/20 due to shortness of breath due to acute on chronic HF. Initially treated with IV lasix with transition to oral diuretics. Elevated troponin. Eliquis held due to GIB. GI and cardiology consults obtained. Started on IV protonix. Developed cardiogenic shock and treated with levophed. Renal function worsened due to dehydration and contrast dye but improved with IV hydration. Antibiotics started due to UTI. Discharged after 21 days. Admitted twice in October.   [name] Browning presents today for an urgent visit with a chief complaint of worsening shortness of breath. [name] Browning says that [name] Browning's been short of breath for months and was doing well after recent discharge until yesterday when his breathing worsening. [name] Browning has associated fatigue, cough, palpitations, light-headedness,  difficulty sleeping (due to SOB) and 3 pound weight gain at home. Denies any abdominal distention, pedal edema, chest pain or wheezing.   No change in his diet and doesn't think [name] Browning's eaten anything salty. Taking furosemide 120mg  daily  Past Medical History:  Diagnosis Date   Anginal pain ([place])    Anxiety    Aortic atherosclerosis (HCC)    Atrial fibrillation ([place]) 01/2019   Bilateral carpal tunnel syndrome    CAD (coronary artery disease)    a.) LHC 2004 --> normal coronaries. b.) normal stress test in 2007 and 2011; c.) Lexiscan 05/20/2014 --> LVEF 55-65%; no significant stress induced ischemia/arrythmia. d.) CT chest 03/25/2019 --> coronaries carcified.   Carotid atherosclerosis, bilateral    Carpal tunnel syndrome, bilateral    CHF (congestive heart failure) (HCC)    Chronic anticoagulation    a.) ASA + apixaban   COPD (chronic obstructive pulmonary disease) (HCC)    CVA (cerebral vascular accident) ([place])    Degenerative disc disease, cervical    Diastolic dysfunction    a.) TTE 05/29/2014 --> LVEF 60-65%; G1DD.   DVT (deep venous thrombosis) (HCC)    GERD (gastroesophageal reflux disease)    History of 2019 novel coronavirus disease (COVID-19) 02/08/2019   HLD (hyperlipidemia)    Hypertension    Kidney stones    Osteoarthritis of right shoulder    Pneumonia    Respiratory failure, acute ([place]) 09/20/2020   a.) severe respiratory distress 1 hour after urological surgery. CXR (+) for acute pulmonary edema. Transferred to ICU  and placed on NIPPV. Questionable aspiration PNA. (+) A.fib with RVR. Improved with ABX, diuresis, and amiodarone.   Skin cancer of face    a.) RIGHT ear and RIGHT forehead; excised.   Syncope    TIA (transient ischemic attack) 2016   Valvular regurgitation    a.) TTE 05/26/2014 --> LVEF 60-65%; trivial [name], mild TR; no AR or PR. b.) TTE 04/20/2015 --> LVEF 55-60%; trivial [name] and PR; no AR or TR.   Past Surgical History:  Procedure Laterality Date    CARDIAC CATHETERIZATION  2004   CARPAL TUNNEL RELEASE Right 06/11/2013   CENTRAL LINE INSERTION  11/14/2020   Procedure: CENTRAL LINE INSERTION;  Surgeon: Leonie Man, MD;  Location: [place] [facility];  Service: Cardiovascular;;   CORONARY/GRAFT ACUTE MI REVASCULARIZATION N/A 11/14/2020   Procedure: Coronary/Graft Acute MI Revascularization;  Surgeon: Leonie Man, MD;  Location: [facility];  Service: Cardiovascular;  Laterality: N/A;   CYSTOSCOPY W/ RETROGRADES  10/16/2020   Procedure: CYSTOSCOPY WITH RETROGRADE PYELOGRAM;  Surgeon: Billey Co, MD;  Location: [facility];  Service: Urology;;   CYSTOSCOPY W/ URETERAL STENT PLACEMENT Left 09/20/2020   Procedure: CYSTOSCOPY WITH RETROGRADE PYELOGRAM/URETERAL STENT PLACEMENT;  Surgeon: Janith Lima, MD;  Location: [facility];  Service: Urology;  Laterality: Left;   CYSTOSCOPY/URETEROSCOPY/HOLMIUM LASER/STENT PLACEMENT Left 10/16/2020   Procedure: CYSTOSCOPY/URETEROSCOPY/HOLMIUM LASER/STENT PLACEMENT;  Surgeon: Billey Co, MD;  Location: [facility];  Service: Urology;  Laterality: Left;   ESOPHAGOGASTRODUODENOSCOPY N/A 11/06/2020   Procedure: ESOPHAGOGASTRODUODENOSCOPY (EGD);  Surgeon: Lucilla Lame, MD;  Location: [facility];  Service: Endoscopy;  Laterality: N/A;   LEFT HEART CATH AND CORONARY ANGIOGRAPHY N/A 11/14/2020   Procedure: LEFT HEART CATH AND CORONARY ANGIOGRAPHY;  Surgeon: Leonie Man, MD;  Location: [place] [facility];  Service: Cardiovascular;  Laterality: N/A;   SHOULDER ARTHROSCOPY WITH OPEN ROTATOR CUFF REPAIR Right 08/24/2017   Procedure: SHOULDER ARTHROSCOPY WITH OPEN ROTATOR CUFF REPAIR;  Surgeon: Corky Mull, MD;  Location: [facility];  Service: Orthopedics;  Laterality: Right;   SKIN CANCER EXCISION  12/01/2016   right ear    SKIN CANCER EXCISION     remove from the right side of the face    THROMBECTOMY Right 2004   leg   THYROIDECTOMY  1950   Not sure if total or partial thyroidectomy.    Family History  Problem Relation Age of Onset   Hypertension Mother    Heart disease Mother    CAD Father    Heart attack Father    Social History   Tobacco Use   Smoking status: Former    Packs/day: 1.00    Years: 46.00    Pack years: 46.00    Types: Cigarettes    Start date: 01/10/1957    Quit date: 02/11/2003    Years since quitting: 17.8   Smokeless tobacco: Former    Types: Snuff    Quit date: 02/11/2003  Substance Use Topics   Alcohol use: Not Currently    Alcohol/week: 7.0 standard drinks    Types: 7 Cans of beer per week   Allergies  Allergen Reactions   Hydromorphone     Hallucination Pt reports [name] Browning doesn't know about this   Prior to Admission medications   Medication Sig Start Date End Date Taking? Authorizing Provider  acetaminophen (TYLENOL) 500 MG tablet Take 2 tablets (1,000 mg total) by mouth every 6 (six) hours as needed for mild pain. 05/07/20  Yes Olean Ree, MD  albuterol (VENTOLIN HFA) 108 (90 Base) MCG/ACT inhaler Inhale into the lungs every 6 (six) hours as needed for wheezing or shortness of breath.   Yes [provider]  apixaban (ELIQUIS) 5 MG TABS tablet Take 1 tablet (5 mg total) by mouth 2 (two) times daily. 09/28/20  Yes Pokhrel, Laxman, MD  aspirin EC 81 MG tablet Take 81 mg by mouth daily after supper.   Yes [provider]  atorvastatin (LIPITOR) 40 MG tablet Take 40 mg by mouth daily after supper.   Yes [provider]  cetirizine (ZYRTEC) 10 MG tablet Take 10 mg by mouth daily. 09/29/20  Yes [provider]  docusate sodium (COLACE) 100 MG capsule Take 1 capsule (100 mg total) by mouth 2 (two) times daily. 12/02/20 12/02/21 Yes Lavina Hamman, MD  famotidine (PEPCID) 20 MG tablet One after supper 10/06/20  Yes Tanda Rockers, MD  Ferrous Gluconate (IRON) 240 (27 Fe) MG TABS Take 27 mg by mouth daily after supper. 09/21/18  Yes [provider]  furosemide (LASIX) 40 MG tablet Take 40 mg by mouth 2  (two) times daily. 2 tablets in the morning, and one tablet in the afternoon   Yes [provider]  isosorbide mononitrate (IMDUR) 30 MG 24 hr tablet Take 0.5 tablets (15 mg total) by mouth daily. 12/03/20  Yes Lavina Hamman, MD  lactulose (CHRONULAC) 10 GM/15ML solution Take 30 mLs (20 g total) by mouth 2 (two) times daily as needed for moderate constipation. 12/02/20  Yes Lavina Hamman, MD  metoprolol tartrate (LOPRESSOR) 25 MG tablet Take 25 mg by mouth 2 (two) times daily.   Yes [provider]  montelukast (SINGULAIR) 10 MG tablet Take 10 mg by mouth at bedtime.   Yes [provider]  pantoprazole (PROTONIX) 40 MG tablet Take 1 tablet (40 mg total) by mouth 2 (two) times daily. 12/02/20  Yes Lavina Hamman, MD  phenazopyridine (PYRIDIUM) 200 MG tablet Take 1 tablet (200 mg total) by mouth 3 (three) times daily as needed (bladder pain). 09/28/20  Yes Pokhrel, Laxman, MD  polyethylene glycol (MIRALAX / GLYCOLAX) 17 g packet Take 17 g by mouth daily as needed for mild constipation or moderate constipation. 09/28/20  Yes Pokhrel, Laxman, MD  potassium chloride SA (KLOR-CON M) 20 MEQ tablet Take 1 tablet (20 mEq total) by mouth daily. 12/18/20  Yes Alisa Graff, FNP  SYMBICORT 160-4.5 MCG/ACT inhaler Inhale 2 puffs into the lungs 2 (two) times daily as needed (shortness of breath). 12/07/18  Yes [provider]  tamsulosin (FLOMAX) 0.4 MG CAPS capsule TAKE 1 CAPSULE BY MOUTH EVERY DAY 11/09/20  Yes Billey Co, MD  umeclidinium-vilanterol (ANORO ELLIPTA) 62.5-25 MCG/ACT AEPB Inhale 1 puff into the lungs daily.   Yes [provider]   Review of Systems  Constitutional:  Positive for appetite change and fatigue (easily).  HENT:  Positive for congestion. Negative for rhinorrhea and sore throat.   Eyes: Negative.   Respiratory:  Positive for cough (productive) and shortness of breath (worsening). Negative for chest tightness and wheezing.    Cardiovascular:  Positive for palpitations. Negative for chest pain and leg swelling.  Gastrointestinal:  Negative for abdominal distention and abdominal pain.  Endocrine: Negative.   Genitourinary: Negative.   Musculoskeletal:  Negative for back pain and neck pain.  Skin: Negative.   Allergic/Immunologic: Negative.   Neurological:  Positive for light-headedness (with sudden position changes). Negative for dizziness.  Hematological:  Negative for  adenopathy. Bruises/bleeds easily.  Psychiatric/Behavioral:  Positive for sleep disturbance (sleeping in recliner). Negative for dysphoric mood. The patient is not nervous/anxious.    Vitals:   12/24/20 1308  BP: (!) 112/56  Pulse: 75  Resp: 16  SpO2: 92%  Weight: 174 lb 4 oz (79 kg)  Height: 6\' 2"  (1.88 m)   Wt Readings from Last 3 Encounters:  12/24/20 174 lb 4 oz (79 kg)  12/18/20 173 lb 8 oz (78.7 kg)  12/07/20 187 lb 6 oz (85 kg)   Lab Results  Component Value Date   CREATININE 1.12 12/18/2020   CREATININE 1.12 12/07/2020   CREATININE 0.95 12/05/2020   Physical Exam Vitals and nursing note reviewed. Exam conducted with a chaperone present (grandson).  Constitutional:      Appearance: Normal appearance.  HENT:     Head: Normocephalic and atraumatic.  Cardiovascular:     Rate and Rhythm: Normal rate. Rhythm irregular.  Pulmonary:     Breath sounds: Examination of the right-upper field reveals wheezing. Examination of the right-lower field reveals wheezing. Examination of the left-lower field reveals rales. Wheezing and rales present. No rhonchi.  Abdominal:     General: There is no distension.     Palpations: Abdomen is soft.  Musculoskeletal:     Cervical back: Normal range of motion and neck supple.     Right lower leg: Tenderness present. Edema (1+ pitting) present.     Left lower leg: Edema (trace pitting) present.  Skin:    General: Skin is warm and dry.  Neurological:     General: No focal deficit present.      Mental Status: [name] Browning is alert and oriented to person, place, and time.  Psychiatric:        Mood and Affect: Mood is not anxious.        Behavior: Behavior normal.   Assessment & Plan:  1: Acute on Chronic heart failure with preserved ejection fraction with structural changes (LAE)- - NYHA class III - fluid overloaded today with pedal edema, rales and worsening SOB - will send for 80mg  IV lasix and 72meq PO potassium today - BMP/BNP to be drawn - may benefit from weekly metolazone - weighing daily with reported 3 pound weight gain; reminded to call for an overnight weight gain of > 2 pounds or a weekly weight gain of > 5 pounds - weight stable from last visit here 1 week ago - not adding salt to his food and trying to read food labels for sodium content - BP may not allow for the addition of GDMT of entresto and SGLT2 - BNP 12/07/20 was 574.3   2: HTN- - BP looks good  (112/56) - saw PCP ([place]) - BMP 12/18/20 reviewed and showed sodium 137, potassium 3.9, creatinine 1.12 and GFR >60 - potassium resumed at last visit  3: COPD- - saw pulmonology Melvyn Novas) 10/06/20 but doesn't want to return to him - need to f/u to see if pulmonology appt has been scheduled - wearing oxygen at 4L now   4: Atrial fibrillation- - saw cardiology ([place]) 05/04/20 but is planning on getting established back with Dr. Humphrey Rolls as [name] Browning doesn't want to go to [facility] to see Dr. Ubaldo Glassing - no longer taking apixaban as [name] Browning was coughing up blood which has since resolved   Medication list reviewed.   Return tomorrow. Should respiratory status worsen, [name] Browning should present to the ED.

## 2020-12-25 ENCOUNTER — Other Ambulatory Visit: Payer: Self-pay | Admitting: Family

## 2020-12-25 ENCOUNTER — Encounter: Payer: Self-pay | Admitting: Family

## 2020-12-25 ENCOUNTER — Ambulatory Visit (HOSPITAL_BASED_OUTPATIENT_CLINIC_OR_DEPARTMENT_OTHER): Payer: Medicare HMO | Admitting: Family

## 2020-12-25 ENCOUNTER — Ambulatory Visit
Admission: RE | Admit: 2020-12-25 | Discharge: 2020-12-25 | Disposition: A | Discharge status: Home / Self Care | Payer: Medicare HMO | Source: Ambulatory Visit | Attending: Family | Admitting: Family

## 2020-12-25 VITALS — BP 138/63 | HR 82 | Resp 18 | Ht 74.0 in | Wt 178.0 lb

## 2020-12-25 DIAGNOSIS — E860 Dehydration: Secondary | ICD-10-CM | POA: Insufficient documentation

## 2020-12-25 DIAGNOSIS — I2510 Atherosclerotic heart disease of native coronary artery without angina pectoris: Secondary | ICD-10-CM | POA: Insufficient documentation

## 2020-12-25 DIAGNOSIS — Z8616 Personal history of COVID-19: Secondary | ICD-10-CM | POA: Insufficient documentation

## 2020-12-25 DIAGNOSIS — I5033 Acute on chronic diastolic (congestive) heart failure: Secondary | ICD-10-CM

## 2020-12-25 DIAGNOSIS — Z86718 Personal history of other venous thrombosis and embolism: Secondary | ICD-10-CM | POA: Insufficient documentation

## 2020-12-25 DIAGNOSIS — G479 Sleep disorder, unspecified: Secondary | ICD-10-CM | POA: Insufficient documentation

## 2020-12-25 DIAGNOSIS — R002 Palpitations: Secondary | ICD-10-CM | POA: Insufficient documentation

## 2020-12-25 DIAGNOSIS — I4811 Longstanding persistent atrial fibrillation: Secondary | ICD-10-CM | POA: Diagnosis not present

## 2020-12-25 DIAGNOSIS — Z9981 Dependence on supplemental oxygen: Secondary | ICD-10-CM | POA: Insufficient documentation

## 2020-12-25 DIAGNOSIS — Z79899 Other long term (current) drug therapy: Secondary | ICD-10-CM | POA: Insufficient documentation

## 2020-12-25 DIAGNOSIS — J449 Chronic obstructive pulmonary disease, unspecified: Secondary | ICD-10-CM | POA: Insufficient documentation

## 2020-12-25 DIAGNOSIS — I130 Hypertensive heart and chronic kidney disease with heart failure and stage 1 through stage 4 chronic kidney disease, or unspecified chronic kidney disease: Secondary | ICD-10-CM | POA: Insufficient documentation

## 2020-12-25 DIAGNOSIS — Z87891 Personal history of nicotine dependence: Secondary | ICD-10-CM | POA: Insufficient documentation

## 2020-12-25 DIAGNOSIS — I10 Essential (primary) hypertension: Secondary | ICD-10-CM | POA: Diagnosis not present

## 2020-12-25 DIAGNOSIS — N189 Chronic kidney disease, unspecified: Secondary | ICD-10-CM | POA: Insufficient documentation

## 2020-12-25 DIAGNOSIS — Z8673 Personal history of transient ischemic attack (TIA), and cerebral infarction without residual deficits: Secondary | ICD-10-CM | POA: Insufficient documentation

## 2020-12-25 DIAGNOSIS — E785 Hyperlipidemia, unspecified: Secondary | ICD-10-CM | POA: Insufficient documentation

## 2020-12-25 DIAGNOSIS — Z09 Encounter for follow-up examination after completed treatment for conditions other than malignant neoplasm: Secondary | ICD-10-CM | POA: Insufficient documentation

## 2020-12-25 DIAGNOSIS — K219 Gastro-esophageal reflux disease without esophagitis: Secondary | ICD-10-CM | POA: Insufficient documentation

## 2020-12-25 LAB — BASIC METABOLIC PANEL
Anion gap: 7 (ref 5–15)
BUN: 19 mg/dL (ref 8–23)
CO2: 31 mmol/L (ref 22–32)
Calcium: 9.3 mg/dL (ref 8.9–10.3)
Chloride: 100 mmol/L (ref 98–111)
Creatinine, Ser: 1.12 mg/dL (ref 0.61–1.24)
GFR, Estimated: >60 mL/min (ref >60)
Glucose, Bld: 110 mg/dL — ABNORMAL HIGH (ref 70–99)
Potassium: 4.1 mmol/L (ref 3.5–5.1)
Sodium: 138 mmol/L (ref 135–145)

## 2020-12-25 LAB — BRAIN NATRIURETIC PEPTIDE: B Natriuretic Peptide: 394.2 pg/mL — ABNORMAL HIGH (ref 0.0–100.0)

## 2020-12-25 MED ORDER — POTASSIUM CHLORIDE CRYS ER 20 MEQ PO TBCR
EXTENDED_RELEASE_TABLET | ORAL | Status: AC
  Administered 2020-12-25: 40 meq via ORAL
  Filled 2020-12-25: qty 2

## 2020-12-25 MED ORDER — FUROSEMIDE 10 MG/ML IJ SOLN
INTRAMUSCULAR | Status: AC
  Administered 2020-12-25: 80 mg via INTRAVENOUS
  Filled 2020-12-25: qty 8

## 2020-12-25 MED ORDER — SODIUM CHLORIDE FLUSH 0.9 % IV SOLN
INTRAVENOUS | Status: AC
  Filled 2020-12-25: qty 10

## 2020-12-25 MED ORDER — POTASSIUM CHLORIDE CRYS ER 20 MEQ PO TBCR: 40.0000 meq | EXTENDED_RELEASE_TABLET | Freq: Once | ORAL | Status: AC

## 2020-12-25 MED ORDER — FUROSEMIDE 10 MG/ML IJ SOLN: 80.0000 mg | Freq: Once | INTRAMUSCULAR | Status: AC

## 2020-12-25 NOTE — Patient Instructions (Signed)
Continue weighing daily and call for an overnight weight gain of 3 pounds or more or a weekly weight gain of more than 5 pounds.  °

## 2020-12-27 NOTE — Progress Notes (Signed)
Patient ID: [name] Browning, male    DOB: 10-12-1940, 80 y.o.   MRN: [id number]  HPI  [name] is a 80 y/o male with a history of carotid disease, CAD, hyperlipidemia, HTN, CKD, stroke, anxiety, atrial fibrillation, COPD, DVT, GERD, previous tobacco use and chronic heart failure.   Echo report from 11/15/20 reviewed and showed an EF of 60-65% along with severe LAE and mild MR.   LHC done 11/14/20 showed: Ost RCA lesion is 95% stenosed.   Prox RCA to Dist RCA lesion is 100% stenosed. -Very minimal flow.  After initial angiography, it disappeared to progressed to the ostium.   Left-to-right collaterals via septal perforators fill the PDA system.   Mid LM to Dist LM lesion is 20% stenosed.   Ost LAD to Prox LAD lesion is 25% stenosed.  Relatively small caliber vessel that wraps the apex.   Diminutive LCx with moderate sized OM1, mild disease.   The left ventricular systolic function is normal. No obvious regional wall motion normalities.   The left ventricular ejection fraction is 55-65% by visual estimate.   LV end diastolic pressure is moderately elevated.  18 mmHg   There is no aortic valve stenosis.  Recent admission at [facility] Admitted 11/11/20 due to shortness of breath due to acute on chronic HF. Initially treated with IV lasix with transition to oral diuretics. Elevated troponin. Eliquis held due to GIB. GI and cardiology consults obtained. Started on IV protonix. Developed cardiogenic shock and treated with levophed. Renal function worsened due to dehydration and contrast dye but improved with IV hydration. Antibiotics started due to UTI. Discharged after 21 days. Admitted twice in October.   He presents today for a follow-up visit with a chief complaint of moderate fatigue with minimal exertion. He describes this as chronic in nature having been present for several years. He has associated productive cough, shortness of breath (varies), wheezing, easy bruising and difficulty sleeping along  with this. He denies any abdominal distention, palpitations, pedal edema, chest pain, dizziness or weight gain.   Received 80mg  IV lasix/ 84meq PO potassium twice last week and feels better. Admits that he isn't sure if his shortness of breath is related to his COPD or his HF.   Knows that he needs to see pulmonology but doesn't want to see the one he saw most recently [name] Browning) and would like to try someone new. Has cardiology f/u scheduled.    Past Medical History:  Diagnosis Date   Anginal pain ([place])    Anxiety    Aortic atherosclerosis ([place])    Atrial fibrillation ([place]) 01/2019   Bilateral carpal tunnel syndrome    CAD (coronary artery disease)    a.) LHC 2004 --> normal coronaries. b.) normal stress test in 2007 and 2011; c.) Lexiscan 05/20/2014 --> LVEF 55-65%; no significant stress induced ischemia/arrythmia. d.) CT chest 03/25/2019 --> coronaries carcified.   Carotid atherosclerosis, bilateral    Carpal tunnel syndrome, bilateral    CHF (congestive heart failure) (HCC)    Chronic anticoagulation    a.) ASA + apixaban   COPD (chronic obstructive pulmonary disease) (HCC)    CVA (cerebral vascular accident) ([place])    Degenerative disc disease, cervical    Diastolic dysfunction    a.) TTE 05/29/2014 --> LVEF 60-65%; G1DD.   DVT (deep venous thrombosis) (HCC)    GERD (gastroesophageal reflux disease)    History of 2019 novel coronavirus disease (COVID-19) 02/08/2019   HLD (hyperlipidemia)    Hypertension  Kidney stones    Osteoarthritis of right shoulder    Pneumonia    Respiratory failure, acute ([place]) 09/20/2020   a.) severe respiratory distress 1 hour after urological surgery. CXR (+) for acute pulmonary edema. Transferred to ICU and placed on NIPPV. Questionable aspiration PNA. (+) A.fib with RVR. Improved with ABX, diuresis, and amiodarone.   Skin cancer of face    a.) RIGHT ear and RIGHT forehead; excised.   Syncope    TIA (transient ischemic attack) 2016   Valvular  regurgitation    a.) TTE 05/26/2014 --> LVEF 60-65%; trivial MR, mild TR; no AR or PR. b.) TTE 04/20/2015 --> LVEF 55-60%; trivial MR and PR; no AR or TR.   Past Surgical History:  Procedure Laterality Date   CARDIAC CATHETERIZATION  2004   CARPAL TUNNEL RELEASE Right 06/11/2013   CENTRAL LINE INSERTION  11/14/2020   Procedure: CENTRAL LINE INSERTION;  Surgeon: Leonie Man, MD;  Location: [place] [facility];  Service: Cardiovascular;;   CORONARY/GRAFT ACUTE MI REVASCULARIZATION N/A 11/14/2020   Procedure: Coronary/Graft Acute MI Revascularization;  Surgeon: Leonie Man, MD;  Location: [place] [facility];  Service: Cardiovascular;  Laterality: N/A;   CYSTOSCOPY W/ RETROGRADES  10/16/2020   Procedure: CYSTOSCOPY WITH RETROGRADE PYELOGRAM;  Surgeon: Billey Co, MD;  Location: [facility];  Service: Urology;;   CYSTOSCOPY W/ URETERAL STENT PLACEMENT Left 09/20/2020   Procedure: CYSTOSCOPY WITH RETROGRADE PYELOGRAM/URETERAL STENT PLACEMENT;  Surgeon: Janith Lima, MD;  Location: [facility];  Service: Urology;  Laterality: Left;   CYSTOSCOPY/URETEROSCOPY/HOLMIUM LASER/STENT PLACEMENT Left 10/16/2020   Procedure: CYSTOSCOPY/URETEROSCOPY/HOLMIUM LASER/STENT PLACEMENT;  Surgeon: Billey Co, MD;  Location: [facility];  Service: Urology;  Laterality: Left;   ESOPHAGOGASTRODUODENOSCOPY N/A 11/06/2020   Procedure: ESOPHAGOGASTRODUODENOSCOPY (EGD);  Surgeon: Lucilla Lame, MD;  Location[facility];  Service: Endoscopy;  Laterality: N/A;   LEFT HEART CATH AND CORONARY ANGIOGRAPHY N/A 11/14/2020   Procedure: LEFT HEART CATH AND CORONARY ANGIOGRAPHY;  Surgeon: Leonie Man, MD;  Location: [place] [facility];  Service: Cardiovascular;  Laterality: N/A;   SHOULDER ARTHROSCOPY WITH OPEN ROTATOR CUFF REPAIR Right 08/24/2017   Procedure: SHOULDER ARTHROSCOPY WITH OPEN ROTATOR CUFF REPAIR;  Surgeon: Corky Mull, MD;  Location: [facility];  Service: Orthopedics;  Laterality: Right;   SKIN  CANCER EXCISION  12/01/2016   right ear    SKIN CANCER EXCISION     remove from the right side of the face    THROMBECTOMY Right 2004   leg   THYROIDECTOMY  1950   Not sure if total or partial thyroidectomy.   Family History  Problem Relation Age of Onset   Hypertension Mother    Heart disease Mother    CAD Father    Heart attack Father    Social History   Tobacco Use   Smoking status: Former    Packs/day: 1.00    Years: 46.00    Pack years: 46.00    Types: Cigarettes    Start date: 01/10/1957    Quit date: 02/11/2003    Years since quitting: 17.8   Smokeless tobacco: Former    Types: Snuff    Quit date: 02/11/2003  Substance Use Topics   Alcohol use: Not Currently    Alcohol/week: 7.0 standard drinks    Types: 7 Cans of beer per week   Allergies  Allergen Reactions   Hydromorphone     Hallucination Pt reports he doesn't know about this   Prior to Admission  medications   Medication Sig Start Date End Date Taking? Authorizing Provider  acetaminophen (TYLENOL) 500 MG tablet Take 2 tablets (1,000 mg total) by mouth every 6 (six) hours as needed for mild pain. 05/07/20  Yes Piscoya, Jacqulyn Bath, MD  albuterol (VENTOLIN HFA) 108 (90 Base) MCG/ACT inhaler Inhale into the lungs every 6 (six) hours as needed for wheezing or shortness of breath.   Yes [provider]  apixaban (ELIQUIS) 5 MG TABS tablet Take 1 tablet (5 mg total) by mouth 2 (two) times daily. 09/28/20  Yes Pokhrel, Laxman, MD  aspirin EC 81 MG tablet Take 81 mg by mouth daily after supper.   Yes [provider]  atorvastatin (LIPITOR) 40 MG tablet Take 40 mg by mouth daily after supper.   Yes [provider]  cetirizine (ZYRTEC) 10 MG tablet Take 10 mg by mouth daily. 09/29/20  Yes [provider]  docusate sodium (COLACE) 100 MG capsule Take 1 capsule (100 mg total) by mouth 2 (two) times daily. 12/02/20 12/02/21 Yes Lavina Hamman, MD  famotidine (PEPCID) 20 MG tablet One after supper  10/06/20  Yes Tanda Rockers, MD  Ferrous Gluconate (IRON) 240 (27 Fe) MG TABS Take 27 mg by mouth daily after supper. 09/21/18  Yes [provider]  furosemide (LASIX) 40 MG tablet Take 40 mg by mouth 2 (two) times daily. 2 tablets in the morning, and one tablet in the afternoon   Yes [provider]  isosorbide mononitrate (IMDUR) 30 MG 24 hr tablet Take 0.5 tablets (15 mg total) by mouth daily. 12/03/20  Yes Lavina Hamman, MD  lactulose (CHRONULAC) 10 GM/15ML solution Take 30 mLs (20 g total) by mouth 2 (two) times daily as needed for moderate constipation. 12/02/20  Yes Lavina Hamman, MD  metoprolol tartrate (LOPRESSOR) 25 MG tablet Take 25 mg by mouth 2 (two) times daily.   Yes [provider]  montelukast (SINGULAIR) 10 MG tablet Take 10 mg by mouth at bedtime.   Yes [provider]  pantoprazole (PROTONIX) 40 MG tablet Take 1 tablet (40 mg total) by mouth 2 (two) times daily. 12/02/20  Yes Lavina Hamman, MD  phenazopyridine (PYRIDIUM) 200 MG tablet Take 1 tablet (200 mg total) by mouth 3 (three) times daily as needed (bladder pain). 09/28/20  Yes Pokhrel, Laxman, MD  polyethylene glycol (MIRALAX / GLYCOLAX) 17 g packet Take 17 g by mouth daily as needed for mild constipation or moderate constipation. 09/28/20  Yes Pokhrel, Laxman, MD  potassium chloride SA (KLOR-CON M) 20 MEQ tablet Take 1 tablet (20 mEq total) by mouth daily. 12/18/20  Yes Alisa Graff, FNP  SYMBICORT 160-4.5 MCG/ACT inhaler Inhale 2 puffs into the lungs 2 (two) times daily as needed (shortness of breath). 12/07/18  Yes [provider]  umeclidinium-vilanterol (ANORO ELLIPTA) 62.5-25 MCG/ACT AEPB Inhale 1 puff into the lungs daily.   Yes [provider]  tamsulosin (FLOMAX) 0.4 MG CAPS capsule TAKE 1 CAPSULE BY MOUTH EVERY DAY 11/09/20   Billey Co, MD    Review of Systems  Constitutional:  Positive for fatigue (easily). Negative for appetite change.  HENT:   Positive for congestion. Negative for rhinorrhea and sore throat.   Eyes: Negative.   Respiratory:  Positive for cough (productive), shortness of breath ("better") and wheezing. Negative for chest tightness.   Cardiovascular:  Negative for chest pain, palpitations and leg swelling.  Gastrointestinal:  Negative for abdominal distention and abdominal pain.  Endocrine: Negative.  Genitourinary: Negative.   Musculoskeletal:  Negative for back pain and neck pain.  Skin: Negative.   Allergic/Immunologic: Negative.   Neurological:  Negative for dizziness and light-headedness.  Hematological:  Negative for adenopathy. Bruises/bleeds easily.  Psychiatric/Behavioral:  Positive for sleep disturbance (sleeping in recliner). Negative for dysphoric mood. The patient is not nervous/anxious.    Vitals:   12/28/20 0933  BP: 117/72  Pulse: 82  Resp: 16  SpO2: 90%  Weight: 174 lb (78.9 kg)  Height: 6\' 2"  (1.88 m)   Wt Readings from Last 3 Encounters:  12/28/20 174 lb (78.9 kg)  12/25/20 178 lb (80.7 kg)  12/24/20 174 lb 4 oz (79 kg)   Lab Results  Component Value Date   CREATININE 1.12 12/25/2020   CREATININE 0.89 12/24/2020   CREATININE 1.12 12/18/2020   Physical Exam Vitals and nursing note reviewed. Exam conducted with a chaperone present (son-in-law).  Constitutional:      Appearance: Normal appearance.  HENT:     Head: Normocephalic and atraumatic.  Cardiovascular:     Rate and Rhythm: Normal rate. Rhythm irregular.  Pulmonary:     Breath sounds: No wheezing, rhonchi or rales.  Abdominal:     General: There is no distension.     Palpations: Abdomen is soft.  Musculoskeletal:     Cervical back: Normal range of motion and neck supple.     Right lower leg: Tenderness present. Edema (1+ pitting) present.     Left lower leg: Edema (trace pitting) present.  Skin:    General: Skin is warm and dry.  Neurological:     General: No focal deficit present.     Mental Status: He is alert  and oriented to person, place, and time.  Psychiatric:        Mood and Affect: Mood is not anxious.        Behavior: Behavior normal.   Assessment & Plan:  1: Chronic heart failure with preserved ejection fraction with structural changes (LAE)- - NYHA class III - euvolemic today - weighing daily; reminded to call for an overnight weight gain of > 2 pounds or a weekly weight gain of > 5 pounds - weight down 4 pounds from last visit here 3 days ago - will get BMP/ BNP today - will add metolazone 2.5mg  once/ week; on that day, he's to take an additional potassium tablet - will check BMP at that visit - not adding salt to his food and trying to read food labels for sodium content - consider adding entresto at next visit although he does mention that he's concerned about taking so many medications - BNP 12/25/20 was 394.2   2: HTN- - BP looks good (117/72) - sees PCP ([facility]) but has to schedule a f/u - BMP 12/25/20 reviewed and showed sodium 138, potassium 4.1, creatinine 1.12 and GFR >60  3: COPD- - saw pulmonology [name] Browning) 10/06/20 but doesn't want to return to him - message sent to pulmonology to see if he can change providers and they will contact him; if he can't change providers, he wanted the number to a completely different group - wearing oxygen at 4L around the clock  4: Atrial fibrillation- - saw cardiology ([facility]) 05/04/20 and has f/u scheduled with [name] Klein, MD - no longer taking apixaban as he was coughing up blood which has since resolved   Medication list reviewed.   Return in 2 weeks or sooner for any questions/problems before then

## 2020-12-28 ENCOUNTER — Telehealth: Payer: Self-pay | Admitting: Internal Medicine

## 2020-12-28 ENCOUNTER — Ambulatory Visit: Payer: Medicare HMO | Attending: Family | Admitting: Family

## 2020-12-28 ENCOUNTER — Other Ambulatory Visit
Admission: RE | Admit: 2020-12-28 | Discharge: 2020-12-28 | Disposition: A | Discharge status: Home / Self Care | Payer: Medicare HMO | Source: Ambulatory Visit | Attending: Family | Admitting: Family

## 2020-12-28 ENCOUNTER — Encounter: Payer: Self-pay | Admitting: Family

## 2020-12-28 ENCOUNTER — Other Ambulatory Visit: Payer: Self-pay

## 2020-12-28 VITALS — BP 117/72 | HR 82 | Resp 16 | Ht 74.0 in | Wt 174.0 lb

## 2020-12-28 DIAGNOSIS — I5032 Chronic diastolic (congestive) heart failure: Secondary | ICD-10-CM | POA: Insufficient documentation

## 2020-12-28 DIAGNOSIS — F419 Anxiety disorder, unspecified: Secondary | ICD-10-CM | POA: Insufficient documentation

## 2020-12-28 DIAGNOSIS — Z86718 Personal history of other venous thrombosis and embolism: Secondary | ICD-10-CM | POA: Diagnosis not present

## 2020-12-28 DIAGNOSIS — Z8673 Personal history of transient ischemic attack (TIA), and cerebral infarction without residual deficits: Secondary | ICD-10-CM | POA: Insufficient documentation

## 2020-12-28 DIAGNOSIS — I130 Hypertensive heart and chronic kidney disease with heart failure and stage 1 through stage 4 chronic kidney disease, or unspecified chronic kidney disease: Secondary | ICD-10-CM | POA: Diagnosis not present

## 2020-12-28 DIAGNOSIS — E785 Hyperlipidemia, unspecified: Secondary | ICD-10-CM | POA: Insufficient documentation

## 2020-12-28 DIAGNOSIS — Z9981 Dependence on supplemental oxygen: Secondary | ICD-10-CM | POA: Diagnosis not present

## 2020-12-28 DIAGNOSIS — J449 Chronic obstructive pulmonary disease, unspecified: Secondary | ICD-10-CM | POA: Diagnosis not present

## 2020-12-28 DIAGNOSIS — I10 Essential (primary) hypertension: Secondary | ICD-10-CM | POA: Diagnosis not present

## 2020-12-28 DIAGNOSIS — I4811 Longstanding persistent atrial fibrillation: Secondary | ICD-10-CM | POA: Diagnosis not present

## 2020-12-28 DIAGNOSIS — K219 Gastro-esophageal reflux disease without esophagitis: Secondary | ICD-10-CM | POA: Insufficient documentation

## 2020-12-28 DIAGNOSIS — I4891 Unspecified atrial fibrillation: Secondary | ICD-10-CM | POA: Insufficient documentation

## 2020-12-28 DIAGNOSIS — Z7901 Long term (current) use of anticoagulants: Secondary | ICD-10-CM | POA: Diagnosis not present

## 2020-12-28 LAB — BASIC METABOLIC PANEL
Anion gap: 8 (ref 5–15)
BUN: 15 mg/dL (ref 8–23)
CO2: 34 mmol/L — ABNORMAL HIGH (ref 22–32)
Calcium: 9.3 mg/dL (ref 8.9–10.3)
Chloride: 96 mmol/L — ABNORMAL LOW (ref 98–111)
Creatinine, Ser: 1.07 mg/dL (ref 0.61–1.24)
GFR, Estimated: >60 mL/min (ref >60)
Glucose, Bld: 98 mg/dL (ref 70–99)
Potassium: 3.7 mmol/L (ref 3.5–5.1)
Sodium: 138 mmol/L (ref 135–145)

## 2020-12-28 LAB — BRAIN NATRIURETIC PEPTIDE: B Natriuretic Peptide: 511.4 pg/mL — ABNORMAL HIGH (ref 0.0–100.0)

## 2020-12-28 MED ORDER — METOLAZONE 2.5 MG PO TABS: 2.5000 mg | ORAL_TABLET | ORAL | 5 refills | Status: DC

## 2020-12-28 NOTE — Telephone Encounter (Signed)
Seems okay.  Can also offer Dr. Mortimer Fries who saw him in the hospital as well either or is okay.

## 2020-12-28 NOTE — Telephone Encounter (Signed)
Spoke to April(DPR) and relayed below message.  April would like to speak with patient to see if he would like to see Dr. Mortimer Fries since he seen him during recent admission. She will call back with update.

## 2020-12-28 NOTE — Telephone Encounter (Signed)
Will await Dr. Wert's response.  °

## 2020-12-28 NOTE — Telephone Encounter (Signed)
Spoke to patient's daughter, Baker Janus.  Unable to speak with Baker Janus regarding patient's care, as she is not listed on DPR. Baker Janus requested that I contact April(DPR).  April stated that patient would like to switch from Dr. Melvyn Novas to Dr. Patsey [name]. She is aware of office protocol.   Dr. Patsey [name] and Dr. Melvyn Novas, please advise. thanks

## 2020-12-28 NOTE — Patient Instructions (Addendum)
Continue weighing daily and call for an overnight weight gain of 3 pounds or more or a weekly weight gain of more than 5 pounds.     Take metolazone 2.5mg  once a week. You will take it 1/2 hour before your morning furosemide. On the day you take this, you need to take an additional potassium tablet.    If you need a different lung doctor, you can call [facility] at [phone]

## 2020-12-29 DIAGNOSIS — J449 Chronic obstructive pulmonary disease, unspecified: Secondary | ICD-10-CM | POA: Diagnosis not present

## 2020-12-29 DIAGNOSIS — J9811 Atelectasis: Secondary | ICD-10-CM | POA: Diagnosis not present

## 2020-12-29 DIAGNOSIS — Z6823 Body mass index (BMI) 23.0-23.9, adult: Secondary | ICD-10-CM | POA: Diagnosis not present

## 2020-12-29 DIAGNOSIS — J9611 Chronic respiratory failure with hypoxia: Secondary | ICD-10-CM | POA: Diagnosis not present

## 2020-12-29 DIAGNOSIS — R0989 Other specified symptoms and signs involving the circulatory and respiratory systems: Secondary | ICD-10-CM | POA: Diagnosis not present

## 2020-12-29 DIAGNOSIS — J90 Pleural effusion, not elsewhere classified: Secondary | ICD-10-CM | POA: Diagnosis not present

## 2020-12-29 DIAGNOSIS — J189 Pneumonia, unspecified organism: Secondary | ICD-10-CM | POA: Diagnosis not present

## 2020-12-29 DIAGNOSIS — R918 Other nonspecific abnormal finding of lung field: Secondary | ICD-10-CM | POA: Diagnosis not present

## 2020-12-29 DIAGNOSIS — I509 Heart failure, unspecified: Secondary | ICD-10-CM | POA: Diagnosis not present

## 2020-12-29 DIAGNOSIS — Z9981 Dependence on supplemental oxygen: Secondary | ICD-10-CM | POA: Diagnosis not present

## 2020-12-30 NOTE — Telephone Encounter (Signed)
Lm for patient's granddaughter, April(DPR)

## 2020-12-31 NOTE — Telephone Encounter (Signed)
Lm x2 for April(DPR). Will close encounter per office protocol.

## 2021-01-04 DIAGNOSIS — R0602 Shortness of breath: Secondary | ICD-10-CM | POA: Diagnosis not present

## 2021-01-08 ENCOUNTER — Ambulatory Visit: Payer: Medicare HMO | Admitting: Family

## 2021-01-08 DIAGNOSIS — R0602 Shortness of breath: Secondary | ICD-10-CM | POA: Diagnosis not present

## 2021-01-08 DIAGNOSIS — I4891 Unspecified atrial fibrillation: Secondary | ICD-10-CM | POA: Diagnosis not present

## 2021-01-08 DIAGNOSIS — K219 Gastro-esophageal reflux disease without esophagitis: Secondary | ICD-10-CM | POA: Diagnosis not present

## 2021-01-08 DIAGNOSIS — E782 Mixed hyperlipidemia: Secondary | ICD-10-CM | POA: Diagnosis not present

## 2021-01-08 DIAGNOSIS — I10 Essential (primary) hypertension: Secondary | ICD-10-CM | POA: Diagnosis not present

## 2021-01-08 DIAGNOSIS — I509 Heart failure, unspecified: Secondary | ICD-10-CM | POA: Diagnosis not present

## 2021-01-09 ENCOUNTER — Emergency Department
Admission: EM | Admit: 2021-01-09 | Discharge: 2021-01-09 | Disposition: A | Discharge status: Home / Self Care | Payer: Medicare HMO | Attending: Emergency Medicine | Admitting: Emergency Medicine

## 2021-01-09 ENCOUNTER — Other Ambulatory Visit: Payer: Self-pay

## 2021-01-09 DIAGNOSIS — J449 Chronic obstructive pulmonary disease, unspecified: Secondary | ICD-10-CM | POA: Diagnosis not present

## 2021-01-09 DIAGNOSIS — Z79899 Other long term (current) drug therapy: Secondary | ICD-10-CM | POA: Diagnosis not present

## 2021-01-09 DIAGNOSIS — Z85828 Personal history of other malignant neoplasm of skin: Secondary | ICD-10-CM | POA: Insufficient documentation

## 2021-01-09 DIAGNOSIS — Z87891 Personal history of nicotine dependence: Secondary | ICD-10-CM | POA: Insufficient documentation

## 2021-01-09 DIAGNOSIS — I110 Hypertensive heart disease with heart failure: Secondary | ICD-10-CM | POA: Insufficient documentation

## 2021-01-09 DIAGNOSIS — I2510 Atherosclerotic heart disease of native coronary artery without angina pectoris: Secondary | ICD-10-CM | POA: Diagnosis not present

## 2021-01-09 DIAGNOSIS — I509 Heart failure, unspecified: Secondary | ICD-10-CM | POA: Diagnosis not present

## 2021-01-09 DIAGNOSIS — Z7982 Long term (current) use of aspirin: Secondary | ICD-10-CM | POA: Diagnosis not present

## 2021-01-09 DIAGNOSIS — R0902 Hypoxemia: Secondary | ICD-10-CM | POA: Diagnosis not present

## 2021-01-09 DIAGNOSIS — Z7901 Long term (current) use of anticoagulants: Secondary | ICD-10-CM | POA: Insufficient documentation

## 2021-01-09 DIAGNOSIS — R040 Epistaxis: Secondary | ICD-10-CM | POA: Insufficient documentation

## 2021-01-09 NOTE — ED Provider Notes (Signed)
[facility] Emergency Department Provider Note    ____________________________________________   None    (approximate)  I have reviewed the triage vital signs and the nursing notes.   HISTORY  Chief Complaint Epistaxis   HPI [name] Browning is a 80 y.o. male, history of CAD, COPD, CHF, presents emergency department for evaluation of nosebleed.  Patient states his nose began bleeding earlier this morning and continued on his way to the emergency department.  After triage, his nosebleed spontaneously ceased with direct pressure only.  He does not have any other active symptoms at this time.  Denies chest pain, shortness of breath, lightheadedness/dizziness, abdominal pain, headache, vision loss, congestion, or urinary symptoms.   History limited by: No limitations  Past Medical History:  Diagnosis Date   Anginal pain ([place])    Anxiety    Aortic atherosclerosis ([place])    Atrial fibrillation ([place]) 01/2019   Bilateral carpal tunnel syndrome    CAD (coronary artery disease)    a.) LHC 2004 --> normal coronaries. b.) normal stress test in 2007 and 2011; c.) Lexiscan 05/20/2014 --> LVEF 55-65%; no significant stress induced ischemia/arrythmia. d.) CT chest 03/25/2019 --> coronaries carcified.   Carotid atherosclerosis, bilateral    Carpal tunnel syndrome, bilateral    CHF (congestive heart failure) (HCC)    Chronic anticoagulation    a.) ASA + apixaban   COPD (chronic obstructive pulmonary disease) (HCC)    CVA (cerebral vascular accident) ([place])    Degenerative disc disease, cervical    Diastolic dysfunction    a.) TTE 05/29/2014 --> LVEF 60-65%; G1DD.   DVT (deep venous thrombosis) (HCC)    GERD (gastroesophageal reflux disease)    History of 2019 novel coronavirus disease (COVID-19) 02/08/2019   HLD (hyperlipidemia)    Hypertension    Kidney stones    Osteoarthritis of right shoulder    Pneumonia    Respiratory failure, acute ([place]) 09/20/2020   a.)  severe respiratory distress 1 hour after urological surgery. CXR (+) for acute pulmonary edema. Transferred to ICU and placed on NIPPV. Questionable aspiration PNA. (+) A.fib with RVR. Improved with ABX, diuresis, and amiodarone.   Skin cancer of face    a.) RIGHT ear and RIGHT forehead; excised.   Syncope    TIA (transient ischemic attack) 2016   Valvular regurgitation    a.) TTE 05/26/2014 --> LVEF 60-65%; trivial MR, mild TR; no AR or PR. b.) TTE 04/20/2015 --> LVEF 55-60%; trivial MR and PR; no AR or TR.    Patient Active Problem List   Diagnosis Date Noted   Acute ST elevation myocardial infarction (STEMI) of anterior wall (HCC)    Cardiogenic shock (HCC)    Hemorrhagic shock (HCC)    Acute on chronic diastolic CHF (congestive heart failure) ([place]) 11/11/2020   GI bleeding 11/11/2020   DVT (deep venous thrombosis) (HCC)    HLD (hyperlipidemia)    Stroke [facility])    CAD (coronary artery disease)    NSTEMI (non-ST elevated myocardial infarction) (HCC)    Hypokalemia    Thrombocytopenia (HCC)    Iron deficiency anemia    Dysphagia    Lactic acidosis    Atrial fibrillation ([place])    Septic shock ([place]) 10/21/2020   Acute on chronic respiratory failure with hypoxia ([place]) 10/20/2020   Community acquired pneumonia 10/20/2020   Hypotension 10/20/2020   DOE (dyspnea on exertion) 10/06/2020   Chronic respiratory failure with hypoxia ([place]) 10/06/2020   Malnutrition of moderate degree 09/21/2020  Urinary tract obstruction by kidney stone 09/19/2020   AKI (acute kidney injury) ([place]) 09/19/2020   COPD exacerbation ([place]) 09/18/2020   Acute respiratory failure with hypoxia ([place]) 09/18/2020   AF (paroxysmal atrial fibrillation) ([place]) 12/45/8099   Umbilical hernia without obstruction and without gangrene    Pneumonia 03/25/2019   History of 2019 novel coronavirus disease (COVID-19) 03/06/2019   COVID-19 virus infection 02/08/2019   New onset atrial fibrillation ([place]) 02/08/2019   COPD,  moderate ([place]) 09/14/2018   Degenerative tear of glenoid labrum of right shoulder 08/24/2017   Injury of tendon of long head of right biceps 08/21/2017   Rotator cuff tendinitis, right 08/21/2017   Traumatic complete tear of right rotator cuff 08/21/2017   Chronic neck pain 03/09/2017   Degenerative disc disease, cervical 03/09/2017   Perennial allergic rhinitis 03/09/2017   Hyperglycemia 09/01/2015   Elevated rheumatoid factor 04/08/2015   Arthritis of both hands 04/01/2015   Bilateral hand pain 03/18/2015   Elevated alkaline phosphatase level 03/02/2015   Anxiety 02/16/2015   Intermittent chest pain 02/16/2015   History of TIA (transient ischemic attack) 05/18/2014   HTN (hypertension) 05/18/2014   Hyperlipidemia 05/18/2014   Status post carpal tunnel release 06/26/2013    Past Surgical History:  Procedure Laterality Date   CARDIAC CATHETERIZATION  2004   CARPAL TUNNEL RELEASE Right 06/11/2013   CENTRAL LINE INSERTION  11/14/2020   Procedure: CENTRAL LINE INSERTION;  Surgeon: Leonie Man, MD;  Location: [facility];  Service: Cardiovascular;;   CORONARY/GRAFT ACUTE MI REVASCULARIZATION N/A 11/14/2020   Procedure: Coronary/Graft Acute MI Revascularization;  Surgeon: Leonie Man, MD;  Location: [facility];  Service: Cardiovascular;  Laterality: N/A;   CYSTOSCOPY W/ RETROGRADES  10/16/2020   Procedure: CYSTOSCOPY WITH RETROGRADE PYELOGRAM;  Surgeon: Billey Co, MD;  Location: [facility];  Service: Urology;;   CYSTOSCOPY W/ URETERAL STENT PLACEMENT Left 09/20/2020   Procedure: CYSTOSCOPY WITH RETROGRADE PYELOGRAM/URETERAL STENT PLACEMENT;  Surgeon: Janith Lima, MD;  Location: [facility];  Service: Urology;  Laterality: Left;   CYSTOSCOPY/URETEROSCOPY/HOLMIUM LASER/STENT PLACEMENT Left 10/16/2020   Procedure: CYSTOSCOPY/URETEROSCOPY/HOLMIUM LASER/STENT PLACEMENT;  Surgeon: Billey Co, MD;  Location: [facility];  Service: Urology;  Laterality: Left;    ESOPHAGOGASTRODUODENOSCOPY N/A 11/06/2020   Procedure: ESOPHAGOGASTRODUODENOSCOPY (EGD);  Surgeon: Lucilla Lame, MD;  Location: [facility];  Service: Endoscopy;  Laterality: N/A;   LEFT HEART CATH AND CORONARY ANGIOGRAPHY N/A 11/14/2020   Procedure: LEFT HEART CATH AND CORONARY ANGIOGRAPHY;  Surgeon: Leonie Man, MD;  Location: [facility];  Service: Cardiovascular;  Laterality: N/A;   SHOULDER ARTHROSCOPY WITH OPEN ROTATOR CUFF REPAIR Right 08/24/2017   Procedure: SHOULDER ARTHROSCOPY WITH OPEN ROTATOR CUFF REPAIR;  Surgeon: Corky Mull, MD;  Location: [facility];  Service: Orthopedics;  Laterality: Right;   SKIN CANCER EXCISION  12/01/2016   right ear    SKIN CANCER EXCISION     remove from the right side of the face    THROMBECTOMY Right 2004   leg   THYROIDECTOMY  1950   Not sure if total or partial thyroidectomy.    Prior to Admission medications   Medication Sig Start Date End Date Taking? Authorizing Provider  acetaminophen (TYLENOL) 500 MG tablet Take 2 tablets (1,000 mg total) by mouth every 6 (six) hours as needed for mild pain. 05/07/20   Piscoya, Jacqulyn Bath, MD  albuterol (VENTOLIN HFA) 108 (90 Base) MCG/ACT inhaler Inhale into the lungs every 6 (six) hours as needed  for wheezing or shortness of breath.    [provider]  apixaban (ELIQUIS) 5 MG TABS tablet Take 1 tablet (5 mg total) by mouth 2 (two) times daily. 09/28/20   Pokhrel, Corrie Mckusick, MD  aspirin EC 81 MG tablet Take 81 mg by mouth daily after supper.    [provider]  atorvastatin (LIPITOR) 40 MG tablet Take 40 mg by mouth daily after supper.    [provider]  cetirizine (ZYRTEC) 10 MG tablet Take 10 mg by mouth daily. 09/29/20   [provider]  docusate sodium (COLACE) 100 MG capsule Take 1 capsule (100 mg total) by mouth 2 (two) times daily. 12/02/20 12/02/21  Lavina Hamman, MD  famotidine (PEPCID) 20 MG tablet One after supper 10/06/20   Tanda Rockers, MD  Ferrous  Gluconate (IRON) 240 (27 Fe) MG TABS Take 27 mg by mouth daily after supper. 09/21/18   [provider]  furosemide (LASIX) 40 MG tablet Take 40 mg by mouth 2 (two) times daily. 2 tablets in the morning, and one tablet in the afternoon    [provider]  isosorbide mononitrate (IMDUR) 30 MG 24 hr tablet Take 0.5 tablets (15 mg total) by mouth daily. 12/03/20   Lavina Hamman, MD  lactulose (CHRONULAC) 10 GM/15ML solution Take 30 mLs (20 g total) by mouth 2 (two) times daily as needed for moderate constipation. 12/02/20   Lavina Hamman, MD  metolazone (ZAROXOLYN) 2.5 MG tablet Take 1 tablet (2.5 mg total) by mouth once a week. Take it 1/2 hour before morning furosemide Take 1 extra potassium on the day you take this 12/28/20 03/28/21  Alisa Graff, FNP  metoprolol tartrate (LOPRESSOR) 25 MG tablet Take 25 mg by mouth 2 (two) times daily.    [provider]  montelukast (SINGULAIR) 10 MG tablet Take 10 mg by mouth at bedtime.    [provider]  pantoprazole (PROTONIX) 40 MG tablet Take 1 tablet (40 mg total) by mouth 2 (two) times daily. 12/02/20   Lavina Hamman, MD  phenazopyridine (PYRIDIUM) 200 MG tablet Take 1 tablet (200 mg total) by mouth 3 (three) times daily as needed (bladder pain). 09/28/20   Pokhrel, Corrie Mckusick, MD  polyethylene glycol (MIRALAX / GLYCOLAX) 17 g packet Take 17 g by mouth daily as needed for mild constipation or moderate constipation. 09/28/20   Pokhrel, Corrie Mckusick, MD  potassium chloride SA (KLOR-CON M) 20 MEQ tablet Take 1 tablet (20 mEq total) by mouth daily. 12/18/20   Alisa Graff, FNP  SYMBICORT 160-4.5 MCG/ACT inhaler Inhale 2 puffs into the lungs 2 (two) times daily as needed (shortness of breath). 12/07/18   [provider]  tamsulosin (FLOMAX) 0.4 MG CAPS capsule TAKE 1 CAPSULE BY MOUTH EVERY DAY 11/09/20   Billey Co, MD  umeclidinium-vilanterol [facility] ELLIPTA) 62.5-25 MCG/ACT AEPB Inhale 1 puff into the lungs daily.     [provider]    Allergies Hydromorphone  Family History  Problem Relation Age of Onset   Hypertension Mother    Heart disease Mother    CAD Father    Heart attack Father     Social History Social History   Tobacco Use   Smoking status: Former    Packs/day: 1.00    Years: 46.00    Pack years: 46.00    Types: Cigarettes    Start date: 01/10/1957    Quit date: 02/11/2003    Years since quitting: 17.9   Smokeless  tobacco: Former    Types: Snuff    Quit date: 02/11/2003  Vaping Use   Vaping Use: Never used  Substance Use Topics   Alcohol use: Not Currently    Alcohol/week: 7.0 standard drinks    Types: 7 Cans of beer per week   Drug use: No    Review of Systems Review of Systems  Constitutional:  Negative for chills and fever.  HENT:  Positive for nosebleeds. Negative for ear pain and sore throat.   Eyes:  Negative for blurred vision.  Respiratory:  Negative for sputum production and shortness of breath.   Cardiovascular:  Negative for chest pain and leg swelling.  Gastrointestinal:  Negative for abdominal pain and vomiting.  Genitourinary:  Negative for dysuria, flank pain and hematuria.  Musculoskeletal:  Negative for myalgias.  Skin:  Negative for rash.  Neurological:  Negative for headaches.    10-point ROS otherwise negative. ____________________________________________   PHYSICAL EXAM:  VITAL SIGNS: ED Triage Vitals  Enc Vitals Group     BP 01/09/21 1335 139/81     Pulse Rate 01/09/21 1335 71     Resp 01/09/21 1335 20     Temp 01/09/21 1335 97.7 F (36.5 C)     Temp Source 01/09/21 1335 Oral     SpO2 01/09/21 1335 100 %     Weight 01/09/21 1336 170 lb (77.1 kg)     Height 01/09/21 1336 6\' 2"  (1.88 m)     Head Circumference --      Peak Flow --      Pain Score 01/09/21 1334 0     Pain Loc --      Pain Edu? --      Excl. in [place]? --     Physical Exam Constitutional:      General: He is not in acute distress.    Appearance: Normal  appearance. He is not ill-appearing.  HENT:     Head: Normocephalic.     Nose: Nose normal.     Comments: Dried blood in the anterior aspect of the left nare.  No active bleeding at this time.  No blood in the back of the throat.    Mouth/Throat:     Mouth: Mucous membranes are moist.     Pharynx: Oropharynx is clear.  Eyes:     Conjunctiva/sclera: Conjunctivae normal.     Pupils: Pupils are equal, round, and reactive to light.  Cardiovascular:     Rate and Rhythm: Normal rate and regular rhythm.  Pulmonary:     Effort: Pulmonary effort is normal. No respiratory distress.     Breath sounds: Normal breath sounds. No wheezing, rhonchi or rales.  Abdominal:     General: Abdomen is flat. There is no distension.     Palpations: Abdomen is soft.  Musculoskeletal:        General: Normal range of motion.     Cervical back: Normal range of motion and neck supple.  Skin:    General: Skin is warm and dry.  Neurological:     General: No focal deficit present.     Mental Status: He is alert. Mental status is at baseline.  Psychiatric:        Mood and Affect: Mood normal.        Behavior: Behavior normal.        Thought Content: Thought content normal.        Judgment: Judgment normal.     ____________________________________________    LABS  (  all labs ordered are listed, but only abnormal results are displayed)  Labs Reviewed - No data to display   ____________________________________________   EKG Not applicable.   ____________________________________________    RADIOLOGY I personally viewed and evaluated these images as part of my medical decision making, as well as reviewing the written report by the radiologist.  ED Provider Interpretation: Not applicable.  No results found.  ____________________________________________   PROCEDURES  Procedures   Medications - No data to display  Critical Care performed: No   ____________________________________________   INITIAL IMPRESSION / ASSESSMENT AND PLAN / ED COURSE  Pertinent labs & imaging results that were available during my care of the patient were reviewed by me and considered in my medical decision making (see chart for details).        AARIK BLANK is a 80 y.o. male, history of CAD, COPD, CHF, presents emergency department for evaluation of nosebleed.  Patient states his nose began bleeding earlier this morning and continued on his way to the emergency department.  After triage, his nosebleed spontaneously ceased with direct pressure only.  He does not have any other active symptoms at this time.  Denies chest pain, shortness of breath, lightheadedness/dizziness, abdominal pain, headache, vision loss, congestion, or urinary symptoms.  Appears well.  No active bleeding at this time.  Patient does not feel that he needs any further work-up.  We will discharge him from waiting room with instructions to pick up Neosporin/bacitracin and oxymetazoline.  Patient was provided with educational material, anticipatory guidance, and encouraged to follow-up with [place] at any time if he begins to experience any new or worsening symptoms.        ____________________________________________   FINAL CLINICAL IMPRESSION(S) / ED DIAGNOSES  Final diagnoses:  Left-sided epistaxis     NEW MEDICATIONS STARTED DURING THIS VISIT:  ED Discharge Orders     None        Note:  This document was prepared using Dragon voice recognition software and may include unintentional dictation errors.    Teodoro Spray, [place] 01/09/21 1348    Lavonia Drafts, MD 01/09/21 1420

## 2021-01-09 NOTE — ED Notes (Signed)
First Nurse Note:  Patient coming [facility] from home for intermittent epistaxis for the past 3 days. Patient seen yesterday at PCP for same and told to go to ED for recurrent bleeding. Not bleeding on EMS arrival. Patient's EMS vitals: 142/81, HR 64 Bpm, 95% on RA.

## 2021-01-09 NOTE — ED Triage Notes (Signed)
Pt states he started with a nose bleed 3 days ago- pt states that it would clot but then the clot would come loose and he would bleed again- bleeding is under control now, but pt states he can still feel a little trickle

## 2021-01-09 NOTE — ED Notes (Signed)
Patient seen by MD Tamala Julian. MD reviewed patient's discharge information with patient. Patient verbalized understanding to MD. Patient stable, no bleeding or complaints at time of discharge.

## 2021-01-09 NOTE — Discharge Instructions (Addendum)
-  Apply bacitracin/neosporin to the inside of your nose to prevent drying and re-bleeding -If rebleeding occurs, use Afrin (oxymetazoline), apply direct pressure, and lean forward for 10 minutes. -Please return to the emergency department at any time if you begin to experience any new or worsening symptoms

## 2021-01-12 DIAGNOSIS — I509 Heart failure, unspecified: Secondary | ICD-10-CM | POA: Diagnosis not present

## 2021-01-12 DIAGNOSIS — R0989 Other specified symptoms and signs involving the circulatory and respiratory systems: Secondary | ICD-10-CM | POA: Diagnosis not present

## 2021-01-12 DIAGNOSIS — J189 Pneumonia, unspecified organism: Secondary | ICD-10-CM | POA: Diagnosis not present

## 2021-01-12 DIAGNOSIS — Z6823 Body mass index (BMI) 23.0-23.9, adult: Secondary | ICD-10-CM | POA: Diagnosis not present

## 2021-01-18 ENCOUNTER — Ambulatory Visit: Payer: Medicare HMO | Admitting: Family

## 2021-01-18 DIAGNOSIS — I509 Heart failure, unspecified: Secondary | ICD-10-CM | POA: Diagnosis not present

## 2021-01-21 DIAGNOSIS — I10 Essential (primary) hypertension: Secondary | ICD-10-CM | POA: Diagnosis not present

## 2021-01-21 DIAGNOSIS — I509 Heart failure, unspecified: Secondary | ICD-10-CM | POA: Diagnosis not present

## 2021-01-21 DIAGNOSIS — I340 Nonrheumatic mitral (valve) insufficiency: Secondary | ICD-10-CM | POA: Diagnosis not present

## 2021-01-21 DIAGNOSIS — I4891 Unspecified atrial fibrillation: Secondary | ICD-10-CM | POA: Diagnosis not present

## 2021-01-21 DIAGNOSIS — R0602 Shortness of breath: Secondary | ICD-10-CM | POA: Diagnosis not present

## 2021-01-21 DIAGNOSIS — K219 Gastro-esophageal reflux disease without esophagitis: Secondary | ICD-10-CM | POA: Diagnosis not present

## 2021-01-21 DIAGNOSIS — E782 Mixed hyperlipidemia: Secondary | ICD-10-CM | POA: Diagnosis not present

## 2021-01-21 DIAGNOSIS — J449 Chronic obstructive pulmonary disease, unspecified: Secondary | ICD-10-CM | POA: Diagnosis not present

## 2021-01-22 DIAGNOSIS — R0602 Shortness of breath: Secondary | ICD-10-CM | POA: Diagnosis not present

## 2021-01-22 DIAGNOSIS — I509 Heart failure, unspecified: Secondary | ICD-10-CM | POA: Diagnosis not present

## 2021-01-22 DIAGNOSIS — I10 Essential (primary) hypertension: Secondary | ICD-10-CM | POA: Diagnosis not present

## 2021-01-22 DIAGNOSIS — I340 Nonrheumatic mitral (valve) insufficiency: Secondary | ICD-10-CM | POA: Diagnosis not present

## 2021-01-22 DIAGNOSIS — I4891 Unspecified atrial fibrillation: Secondary | ICD-10-CM | POA: Diagnosis not present

## 2021-01-22 DIAGNOSIS — E782 Mixed hyperlipidemia: Secondary | ICD-10-CM | POA: Diagnosis not present

## 2021-01-22 DIAGNOSIS — J449 Chronic obstructive pulmonary disease, unspecified: Secondary | ICD-10-CM | POA: Diagnosis not present

## 2021-01-22 DIAGNOSIS — K219 Gastro-esophageal reflux disease without esophagitis: Secondary | ICD-10-CM | POA: Diagnosis not present

## 2021-01-25 NOTE — Progress Notes (Signed)
Patient ID: [name] Browning, male    DOB: 10/17/1940, 81 y.o.   MRN: [id number]  HPI  Mr Meacham is a 81 y/o male with a history of carotid disease, CAD, hyperlipidemia, HTN, CKD, stroke, anxiety, atrial fibrillation, COPD, DVT, GERD, previous tobacco use and chronic heart failure.   Echo report from 11/15/20 reviewed and showed an EF of 60-65% along with severe LAE and mild MR.   LHC done 11/14/20 showed: Ost RCA lesion is 95% stenosed.   Prox RCA to Dist RCA lesion is 100% stenosed. -Very minimal flow.  After initial angiography, it disappeared to progressed to the ostium.   Left-to-right collaterals via septal perforators fill the PDA system.   Mid LM to Dist LM lesion is 20% stenosed.   Ost LAD to Prox LAD lesion is 25% stenosed.  Relatively small caliber vessel that wraps the apex.   Diminutive LCx with moderate sized OM1, mild disease.   The left ventricular systolic function is normal. No obvious regional wall motion normalities.   The left ventricular ejection fraction is 55-65% by visual estimate.   LV end diastolic pressure is moderately elevated.  18 mmHg   There is no aortic valve stenosis.  Was in the ED 01/09/21 due to left-sided epistaxis. While being triaged, nosebleed stopped with pressure and he was released. Recent admission at [facility]. Admitted 11/11/20 due to shortness of breath due to acute on chronic HF. Initially treated with IV lasix with transition to oral diuretics. Elevated troponin. Eliquis held due to GIB. GI and cardiology consults obtained. Started on IV protonix. Developed cardiogenic shock and treated with levophed. Renal function worsened due to dehydration and contrast dye but improved with IV hydration. Antibiotics started due to UTI. Discharged after 21 days. Admitted twice in October.   He presents today for a follow-up visit with a chief complaint of moderate fatigue with little exertion. Says that this has been chronic in nature having been present for  several years. He has associated cough, shortness of breath, palpitations, intermittent dizziness & difficulty sleeping along with this. He denies any abdominal distention, pedal edema, chest pain, wheezing or weight gain.   Taking the metolazone once/ week and notices that he feels more dizzy when standing on that day. Has been taking an extra potassium tablet on that day (Tuesday)  Past Medical History:  Diagnosis Date   Anginal pain ([place])    Anxiety    Aortic atherosclerosis (HCC)    Atrial fibrillation ([place]) 01/2019   Bilateral carpal tunnel syndrome    CAD (coronary artery disease)    a.) LHC 2004 --> normal coronaries. b.) normal stress test in 2007 and 2011; c.) Lexiscan 05/20/2014 --> LVEF 55-65%; no significant stress induced ischemia/arrythmia. d.) CT chest 03/25/2019 --> coronaries carcified.   Carotid atherosclerosis, bilateral    Carpal tunnel syndrome, bilateral    CHF (congestive heart failure) (HCC)    Chronic anticoagulation    a.) ASA + apixaban   COPD (chronic obstructive pulmonary disease) (HCC)    CVA (cerebral vascular accident) ([place])    Degenerative disc disease, cervical    Diastolic dysfunction    a.) TTE 05/29/2014 --> LVEF 60-65%; G1DD.   DVT (deep venous thrombosis) (HCC)    GERD (gastroesophageal reflux disease)    History of 2019 novel coronavirus disease (COVID-19) 02/08/2019   HLD (hyperlipidemia)    Hypertension    Kidney stones    Osteoarthritis of right shoulder    Pneumonia    Respiratory  failure, acute ([place]) 09/20/2020   a.) severe respiratory distress 1 hour after urological surgery. CXR (+) for acute pulmonary edema. Transferred to ICU and placed on NIPPV. Questionable aspiration PNA. (+) A.fib with RVR. Improved with ABX, diuresis, and amiodarone.   Skin cancer of face    a.) RIGHT ear and RIGHT forehead; excised.   Syncope    TIA (transient ischemic attack) 2016   Valvular regurgitation    a.) TTE 05/26/2014 --> LVEF 60-65%; trivial MR,  mild TR; no AR or PR. b.) TTE 04/20/2015 --> LVEF 55-60%; trivial MR and PR; no AR or TR.   Past Surgical History:  Procedure Laterality Date   CARDIAC CATHETERIZATION  2004   CARPAL TUNNEL RELEASE Right 06/11/2013   CENTRAL LINE INSERTION  11/14/2020   Procedure: CENTRAL LINE INSERTION;  Surgeon: Leonie Man, MD;  Location: [place] [facility];  Service: Cardiovascular;;   CORONARY/GRAFT ACUTE MI REVASCULARIZATION N/A 11/14/2020   Procedure: Coronary/Graft Acute MI Revascularization;  Surgeon: Leonie Man, MD;  Location: [place] [facility];  Service: Cardiovascular;  Laterality: N/A;   CYSTOSCOPY W/ RETROGRADES  10/16/2020   Procedure: CYSTOSCOPY WITH RETROGRADE PYELOGRAM;  Surgeon: Billey Co, MD;  Location: [facility];  Service: Urology;;   CYSTOSCOPY W/ URETERAL STENT PLACEMENT Left 09/20/2020   Procedure: CYSTOSCOPY WITH RETROGRADE PYELOGRAM/URETERAL STENT PLACEMENT;  Surgeon: Janith Lima, MD;  Location: [facility];  Service: Urology;  Laterality: Left;   CYSTOSCOPY/URETEROSCOPY/HOLMIUM LASER/STENT PLACEMENT Left 10/16/2020   Procedure: CYSTOSCOPY/URETEROSCOPY/HOLMIUM LASER/STENT PLACEMENT;  Surgeon: Billey Co, MD;  Location: [facility];  Service: Urology;  Laterality: Left;   ESOPHAGOGASTRODUODENOSCOPY N/A 11/06/2020   Procedure: ESOPHAGOGASTRODUODENOSCOPY (EGD);  Surgeon: Lucilla Lame, MD;  Location: [facility];  Service: Endoscopy;  Laterality: N/A;   LEFT HEART CATH AND CORONARY ANGIOGRAPHY N/A 11/14/2020   Procedure: LEFT HEART CATH AND CORONARY ANGIOGRAPHY;  Surgeon: Leonie Man, MD;  Location: [place] [facility];  Service: Cardiovascular;  Laterality: N/A;   SHOULDER ARTHROSCOPY WITH OPEN ROTATOR CUFF REPAIR Right 08/24/2017   Procedure: SHOULDER ARTHROSCOPY WITH OPEN ROTATOR CUFF REPAIR;  Surgeon: Corky Mull, MD;  Location: [facility];  Service: Orthopedics;  Laterality: Right;   SKIN CANCER EXCISION  12/01/2016   right ear    SKIN CANCER EXCISION      remove from the right side of the face    THROMBECTOMY Right 2004   leg   THYROIDECTOMY  1950   Not sure if total or partial thyroidectomy.   Family History  Problem Relation Age of Onset   Hypertension Mother    Heart disease Mother    CAD Father    Heart attack Father    Social History   Tobacco Use   Smoking status: Former    Packs/day: 1.00    Years: 46.00    Pack years: 46.00    Types: Cigarettes    Start date: 01/10/1957    Quit date: 02/11/2003    Years since quitting: 17.9   Smokeless tobacco: Former    Types: Snuff    Quit date: 02/11/2003  Substance Use Topics   Alcohol use: Not Currently    Alcohol/week: 7.0 standard drinks    Types: 7 Cans of beer per week   Allergies  Allergen Reactions   Hydromorphone     Hallucination Pt reports he doesn't know about this   Prior to Admission medications   Medication Sig Start Date End Date Taking? Authorizing Provider  acetaminophen (TYLENOL) 500 MG  tablet Take 2 tablets (1,000 mg total) by mouth every 6 (six) hours as needed for mild pain. 05/07/20  Yes Piscoya, Jacqulyn Bath, MD  albuterol (VENTOLIN HFA) 108 (90 Base) MCG/ACT inhaler Inhale into the lungs every 6 (six) hours as needed for wheezing or shortness of breath.   Yes [provider]  apixaban (ELIQUIS) 5 MG TABS tablet Take 1 tablet (5 mg total) by mouth 2 (two) times daily. 09/28/20  Yes Pokhrel, Laxman, MD  aspirin EC 81 MG tablet Take 81 mg by mouth daily after supper.   Yes [provider]  atorvastatin (LIPITOR) 40 MG tablet Take 40 mg by mouth daily after supper.   Yes [provider]  cetirizine (ZYRTEC) 10 MG tablet Take 10 mg by mouth daily. 09/29/20  Yes [provider]  docusate sodium (COLACE) 100 MG capsule Take 1 capsule (100 mg total) by mouth 2 (two) times daily. 12/02/20 12/02/21 Yes Lavina Hamman, MD  famotidine (PEPCID) 20 MG tablet One after supper 10/06/20  Yes Tanda Rockers, MD  Ferrous Gluconate (IRON) 240 (27 Fe)  MG TABS Take 27 mg by mouth daily after supper. 09/21/18  Yes [provider]  furosemide (LASIX) 40 MG tablet Take 40 mg by mouth 2 (two) times daily. 2 tablets in the morning, and one tablet in the afternoon   Yes [provider]  isosorbide mononitrate (IMDUR) 30 MG 24 hr tablet Take 0.5 tablets (15 mg total) by mouth daily. 12/03/20  Yes Lavina Hamman, MD  lactulose (CHRONULAC) 10 GM/15ML solution Take 30 mLs (20 g total) by mouth 2 (two) times daily as needed for moderate constipation. 12/02/20  Yes Lavina Hamman, MD  metolazone (ZAROXOLYN) 2.5 MG tablet Take 1 tablet (2.5 mg total) by mouth once a week. Take it 1/2 hour before morning furosemide Take 1 extra potassium on the day you take this 12/28/20 03/28/21 Yes Darylene Price A, FNP  metoprolol tartrate (LOPRESSOR) 25 MG tablet Take 25 mg by mouth 2 (two) times daily.   Yes [provider]  montelukast (SINGULAIR) 10 MG tablet Take 10 mg by mouth at bedtime.   Yes [provider]  pantoprazole (PROTONIX) 40 MG tablet Take 1 tablet (40 mg total) by mouth 2 (two) times daily. 12/02/20  Yes Lavina Hamman, MD  phenazopyridine (PYRIDIUM) 200 MG tablet Take 1 tablet (200 mg total) by mouth 3 (three) times daily as needed (bladder pain). 09/28/20  Yes Pokhrel, Laxman, MD  polyethylene glycol (MIRALAX / GLYCOLAX) 17 g packet Take 17 g by mouth daily as needed for mild constipation or moderate constipation. 09/28/20  Yes Pokhrel, Laxman, MD  potassium chloride SA (KLOR-CON M) 20 MEQ tablet Take 1 tablet (20 mEq total) by mouth daily. 12/18/20  Yes Alisa Graff, FNP  SYMBICORT 160-4.5 MCG/ACT inhaler Inhale 2 puffs into the lungs 2 (two) times daily as needed (shortness of breath). 12/07/18  Yes [provider]  tamsulosin (FLOMAX) 0.4 MG CAPS capsule TAKE 1 CAPSULE BY MOUTH EVERY DAY 11/09/20  Yes Billey Co, MD  umeclidinium-vilanterol (ANORO ELLIPTA) 62.5-25 MCG/ACT AEPB Inhale 1 puff into the  lungs daily.   Yes [provider]   Review of Systems  Constitutional:  Positive for fatigue (easily). Negative for appetite change.  HENT:  Negative for congestion, rhinorrhea and sore throat.   Eyes: Negative.   Respiratory:  Positive for cough (productive) and shortness of breath. Negative for chest tightness and wheezing.   Cardiovascular:  Positive for palpitations (at times). Negative for chest pain and leg swelling.  Gastrointestinal:  Negative for abdominal distention and abdominal pain.  Endocrine: Negative.   Genitourinary: Negative.   Musculoskeletal:  Negative for back pain and neck pain.  Skin: Negative.   Allergic/Immunologic: Negative.   Neurological:  Positive for dizziness (when standing). Negative for light-headedness.  Hematological:  Negative for adenopathy. Bruises/bleeds easily.  Psychiatric/Behavioral:  Positive for sleep disturbance (sleeping in recliner). Negative for dysphoric mood. The patient is not nervous/anxious.    Vitals:   01/27/21 0910  BP: 107/63  Pulse: 67  Resp: 16  SpO2: 100%  Weight: 174 lb (78.9 kg)  Height: 6\' 2"  (1.88 m)   Wt Readings from Last 3 Encounters:  01/27/21 174 lb (78.9 kg)  01/09/21 170 lb (77.1 kg)  12/28/20 174 lb (78.9 kg)   Lab Results  Component Value Date   CREATININE 1.07 12/28/2020   CREATININE 1.12 12/25/2020   CREATININE 0.89 12/24/2020   Physical Exam Vitals and nursing note reviewed.  Constitutional:      Appearance: Normal appearance.  HENT:     Head: Normocephalic and atraumatic.  Cardiovascular:     Rate and Rhythm: Normal rate. Rhythm irregular.  Pulmonary:     Breath sounds: No wheezing, rhonchi or rales.  Abdominal:     General: There is no distension.     Palpations: Abdomen is soft.  Musculoskeletal:     Cervical back: Normal range of motion and neck supple.     Right lower leg: Tenderness present. Edema (trace pitting) present.     Left lower leg: Edema (trace pitting) present.   Skin:    General: Skin is warm and dry.  Neurological:     General: No focal deficit present.     Mental Status: He is alert and oriented to person, place, and time.  Psychiatric:        Mood and Affect: Mood is not anxious.        Behavior: Behavior normal.   Assessment & Plan:  1: Chronic heart failure with preserved ejection fraction with structural changes (LAE)- - NYHA class III - euvolemic today - weighing daily; reminded to call for an overnight weight gain of > 2 pounds or a weekly weight gain of > 5 pounds - weight stable from last visit here 1 month ago - will check BMP today since metolazone 2.5mg  weekly was added at last visit; does take an additional potassium tablet on the day that he takes the metolazone - not adding salt to his food and trying to read food labels for sodium content - consider adding entresto although current BP may not tolerate; decreasing furosemide per below - BNP 12/28/20 was 511.4  - PharmD reconciled medications with the patient  2: HTN- - BP looks good (107/63); will decrease his furosemide to just in the morning and drop the evening dose; instructed that he could take the evening dose for above weight gain, swelling or shortness of breath - sees PCP ([facility]) but has to schedule a f/u - BMP 12/28/20 reviewed and showed sodium 138, potassium 3.7, creatinine 1.07 and GFR >60  3: COPD- - to see pulmonology ([place]) 02/18/21 - wearing oxygen at 4L around the clock  4: Atrial fibrillation- - saw cardiology ([facility]) 05/04/20 and has f/u scheduled with Stark Klein, MD - no longer taking apixaban as he was coughing up blood which has since resolved   Medication list reviewed.   Return in 1 month  sooner if needed.

## 2021-01-26 ENCOUNTER — Encounter: Payer: Self-pay | Admitting: Cardiology

## 2021-01-27 ENCOUNTER — Other Ambulatory Visit
Admission: RE | Admit: 2021-01-27 | Discharge: 2021-01-27 | Disposition: A | Discharge status: Home / Self Care | Payer: Medicare HMO | Source: Ambulatory Visit | Attending: Family | Admitting: Family

## 2021-01-27 ENCOUNTER — Encounter: Payer: Self-pay | Admitting: Family

## 2021-01-27 ENCOUNTER — Other Ambulatory Visit: Payer: Self-pay

## 2021-01-27 ENCOUNTER — Telehealth: Payer: Self-pay | Admitting: Family

## 2021-01-27 ENCOUNTER — Encounter: Payer: Self-pay | Admitting: Pharmacist

## 2021-01-27 ENCOUNTER — Ambulatory Visit (HOSPITAL_BASED_OUTPATIENT_CLINIC_OR_DEPARTMENT_OTHER): Payer: Medicare HMO | Admitting: Family

## 2021-01-27 VITALS — BP 107/63 | HR 67 | Resp 16 | Ht 74.0 in | Wt 174.0 lb

## 2021-01-27 DIAGNOSIS — I509 Heart failure, unspecified: Secondary | ICD-10-CM | POA: Diagnosis not present

## 2021-01-27 DIAGNOSIS — I2510 Atherosclerotic heart disease of native coronary artery without angina pectoris: Secondary | ICD-10-CM | POA: Insufficient documentation

## 2021-01-27 DIAGNOSIS — K219 Gastro-esophageal reflux disease without esophagitis: Secondary | ICD-10-CM | POA: Insufficient documentation

## 2021-01-27 DIAGNOSIS — E785 Hyperlipidemia, unspecified: Secondary | ICD-10-CM | POA: Insufficient documentation

## 2021-01-27 DIAGNOSIS — Z87891 Personal history of nicotine dependence: Secondary | ICD-10-CM | POA: Insufficient documentation

## 2021-01-27 DIAGNOSIS — I10 Essential (primary) hypertension: Secondary | ICD-10-CM

## 2021-01-27 DIAGNOSIS — Z8616 Personal history of COVID-19: Secondary | ICD-10-CM | POA: Insufficient documentation

## 2021-01-27 DIAGNOSIS — G479 Sleep disorder, unspecified: Secondary | ICD-10-CM | POA: Diagnosis not present

## 2021-01-27 DIAGNOSIS — I4891 Unspecified atrial fibrillation: Secondary | ICD-10-CM | POA: Insufficient documentation

## 2021-01-27 DIAGNOSIS — N189 Chronic kidney disease, unspecified: Secondary | ICD-10-CM | POA: Insufficient documentation

## 2021-01-27 DIAGNOSIS — J189 Pneumonia, unspecified organism: Secondary | ICD-10-CM | POA: Diagnosis not present

## 2021-01-27 DIAGNOSIS — Z8673 Personal history of transient ischemic attack (TIA), and cerebral infarction without residual deficits: Secondary | ICD-10-CM | POA: Insufficient documentation

## 2021-01-27 DIAGNOSIS — F419 Anxiety disorder, unspecified: Secondary | ICD-10-CM | POA: Insufficient documentation

## 2021-01-27 DIAGNOSIS — I130 Hypertensive heart and chronic kidney disease with heart failure and stage 1 through stage 4 chronic kidney disease, or unspecified chronic kidney disease: Secondary | ICD-10-CM | POA: Insufficient documentation

## 2021-01-27 DIAGNOSIS — Z09 Encounter for follow-up examination after completed treatment for conditions other than malignant neoplasm: Secondary | ICD-10-CM | POA: Diagnosis not present

## 2021-01-27 DIAGNOSIS — R5383 Other fatigue: Secondary | ICD-10-CM | POA: Diagnosis not present

## 2021-01-27 DIAGNOSIS — J449 Chronic obstructive pulmonary disease, unspecified: Secondary | ICD-10-CM

## 2021-01-27 DIAGNOSIS — Z9981 Dependence on supplemental oxygen: Secondary | ICD-10-CM | POA: Diagnosis not present

## 2021-01-27 DIAGNOSIS — R002 Palpitations: Secondary | ICD-10-CM | POA: Insufficient documentation

## 2021-01-27 DIAGNOSIS — I4811 Longstanding persistent atrial fibrillation: Secondary | ICD-10-CM

## 2021-01-27 DIAGNOSIS — Z86718 Personal history of other venous thrombosis and embolism: Secondary | ICD-10-CM | POA: Insufficient documentation

## 2021-01-27 DIAGNOSIS — Z79899 Other long term (current) drug therapy: Secondary | ICD-10-CM | POA: Insufficient documentation

## 2021-01-27 DIAGNOSIS — I5032 Chronic diastolic (congestive) heart failure: Secondary | ICD-10-CM

## 2021-01-27 LAB — BASIC METABOLIC PANEL
Anion gap: 8 (ref 5–15)
BUN: 32 mg/dL — ABNORMAL HIGH (ref 8–23)
CO2: 36 mmol/L — ABNORMAL HIGH (ref 22–32)
Calcium: 9.5 mg/dL (ref 8.9–10.3)
Chloride: 93 mmol/L — ABNORMAL LOW (ref 98–111)
Creatinine, Ser: 1.23 mg/dL (ref 0.61–1.24)
GFR, Estimated: 60 mL/min — ABNORMAL LOW (ref >60)
Glucose, Bld: 93 mg/dL (ref 70–99)
Potassium: 3 mmol/L — ABNORMAL LOW (ref 3.5–5.1)
Sodium: 137 mmol/L (ref 135–145)

## 2021-01-27 NOTE — Patient Instructions (Addendum)
Continue weighing daily and call for an overnight weight gain of 3 pounds or more or a weekly weight gain of more than 5 pounds.    Continue taking furosemide (lasix) in the morning but do not take any in the evening. However, you can an additional lasix tablet if you need it for above weight gain, swelling or shortness of breath.

## 2021-01-27 NOTE — Telephone Encounter (Signed)
Spoke with patient's daughter, Baker Janus, regarding lab results obtained earlier today. Advised that his renal function looked good but his potassium level was on the low side at 3.0.   He currently takes 55meq daily with an additional 90meq on the days that he takes the metolazone (on Tuesdays). Claretta Fraise that he needs to take an additional 3 potassium tablets (total 99meq) today and then resume his regular potassium dosing per above.   Will recheck his labs at next visit. Baker Janus verbalized understanding.

## 2021-01-27 NOTE — Progress Notes (Signed)
[facility] - PHARMACIST COUNSELING NOTE  *HFpEF*  ACE/ARB/ARNI: N/A Beta Blocker: Metoprolol tartrate 25 mg twice daily Aldosterone Antagonist:  n/a Diuretic: Furosemide 40 mg in AM and 20mg  in PM  plus metolazone 2.5mg  1x/week (Tuesdays) SGLT2i:  n/a  Adherence Assessment  Do you ever forget to take your medication? [] Yes [x] No  Do you ever skip doses due to side effects? [] Yes [x] No  Do you have trouble affording your medicines? [] Yes [x] No  Are you ever unable to pick up your medication due to transportation difficulties? [] Yes [x] No  Do you ever stop taking your medications because you don't believe they are helping? [] Yes [x] No  Do you check your weight daily? [x] Yes [] No   Adherence strategy: pill box  Barriers to obtaining medications: none  Diet: tries to keep low sodium diet. Avoid adding salt to cooked food.  Vital signs: HR 67, BP 107/63, weight (pounds) 174 lb (stable)  ECHO: Date 11/15/20, EF 60-65%, The left ventricle  demonstrates regional wall motion abnormalities. The left ventricular  internal cavity size was normal in size.   Cath: Date 11/14/20, LV  ejection fraction is 55-65% by visual estimate.  BMP Latest Ref Rng & Units 12/28/2020 12/25/2020 12/24/2020  Glucose 70 - 99 mg/dL 98 110(H) 110(H)  BUN 8 - 23 mg/dL 15 19 24(H)  Creatinine 0.61 - 1.24 mg/dL 1.07 1.12 0.89  BUN/Creat Ratio 10 - 24 - - -  Sodium 135 - 145 mmol/L 138 138 138  Potassium 3.5 - 5.1 mmol/L 3.7 4.1 3.8  Chloride 98 - 111 mmol/L 96(L) 100 99  CO2 22 - 32 mmol/L 34(H) 31 32  Calcium 8.9 - 10.3 mg/dL 9.3 9.3 9.1    Past Medical History:  Diagnosis Date   Anginal pain (HCC)    Anxiety    Aortic atherosclerosis (HCC)    Atrial fibrillation ([place]) 01/2019   Bilateral carpal tunnel syndrome    CAD (coronary artery disease)    a.) LHC 2004 --> normal coronaries. b.) normal stress test in 2007 and 2011; c.) Lexiscan 05/20/2014 -->  LVEF 55-65%; no significant stress induced ischemia/arrythmia. d.) CT chest 03/25/2019 --> coronaries carcified.   Carotid atherosclerosis, bilateral    Carpal tunnel syndrome, bilateral    CHF (congestive heart failure) (HCC)    Chronic anticoagulation    a.) ASA + apixaban   COPD (chronic obstructive pulmonary disease) (HCC)    CVA (cerebral vascular accident) ([place])    Degenerative disc disease, cervical    Diastolic dysfunction    a.) TTE 05/29/2014 --> LVEF 60-65%; G1DD.   DVT (deep venous thrombosis) (HCC)    GERD (gastroesophageal reflux disease)    History of 2019 novel coronavirus disease (COVID-19) 02/08/2019   HLD (hyperlipidemia)    Hypertension    Kidney stones    Osteoarthritis of right shoulder    Pneumonia    Respiratory failure, acute ([place]) 09/20/2020   a.) severe respiratory distress 1 hour after urological surgery. CXR (+) for acute pulmonary edema. Transferred to ICU and placed on NIPPV. Questionable aspiration PNA. (+) A.fib with RVR. Improved with ABX, diuresis, and amiodarone.   Skin cancer of face    a.) RIGHT ear and RIGHT forehead; excised.   Syncope    TIA (transient ischemic attack) 2016   Valvular regurgitation    a.) TTE 05/26/2014 --> LVEF 60-65%; trivial MR, mild TR; no AR or PR. b.) TTE 04/20/2015 --> LVEF 55-60%; trivial MR and PR; no AR or  TR.    ASSESSMENT 81 year old male who presents to the HF clinic for follow up. PMH relevant for HFpEF with structural changes, HTN, COPD, CAD, and A.Fib. Patient presents to clinic complaining of increased fatigue and SOB. Reports his swelling is all gone, sputum is clear, and BP sometime lower at home. Increased dizziness and SOB noted upon standing, and resolved after sitting for few minutes. Also noted patient is taking 2nd Lasix dose around 8pm and sleeping 3-4 hrs per night.  Recent ED Visit (past 6 months): Date - 01/09/21, CC - Epitaxis and 12/08/20, [facility] - chest pain & SOB  PLAN  - Decrease diuretics to  Furosemide 40mg  daily - Continue metolazone 2.5mg  every Tuesday ONLY - Use compression stockings during the day -Consider adding low dose losartan once stable (if able to tolerate)  Time spent: 15 minutes  Ryer Asato Rodriguez-Guzman PharmD, BCPS 01/27/2021 7:23 AM   Current Outpatient Medications:    acetaminophen (TYLENOL) 500 MG tablet, Take 2 tablets (1,000 mg total) by mouth every 6 (six) hours as needed for mild pain., Disp: , Rfl:    albuterol (VENTOLIN HFA) 108 (90 Base) MCG/ACT inhaler, Inhale into the lungs every 6 (six) hours as needed for wheezing or shortness of breath., Disp: , Rfl:    apixaban (ELIQUIS) 5 MG TABS tablet, Take 1 tablet (5 mg total) by mouth 2 (two) times daily., Disp: 60 tablet, Rfl: 2   aspirin EC 81 MG tablet, Take 81 mg by mouth daily after supper., Disp: , Rfl:    atorvastatin (LIPITOR) 40 MG tablet, Take 40 mg by mouth daily after supper., Disp: , Rfl:    cetirizine (ZYRTEC) 10 MG tablet, Take 10 mg by mouth daily., Disp: , Rfl:    docusate sodium (COLACE) 100 MG capsule, Take 1 capsule (100 mg total) by mouth 2 (two) times daily., Disp: 60 capsule, Rfl: 2   famotidine (PEPCID) 20 MG tablet, One after supper, Disp: 30 tablet, Rfl: 11   Ferrous Gluconate (IRON) 240 (27 Fe) MG TABS, Take 27 mg by mouth daily after supper., Disp: , Rfl:    furosemide (LASIX) 40 MG tablet, Take 40 mg by mouth 2 (two) times daily. 2 tablets in the morning, and one tablet in the afternoon, Disp: , Rfl:    isosorbide mononitrate (IMDUR) 30 MG 24 hr tablet, Take 0.5 tablets (15 mg total) by mouth daily., Disp: 30 tablet, Rfl: 0   lactulose (CHRONULAC) 10 GM/15ML solution, Take 30 mLs (20 g total) by mouth 2 (two) times daily as needed for moderate constipation., Disp: 236 mL, Rfl: 0   metolazone (ZAROXOLYN) 2.5 MG tablet, Take 1 tablet (2.5 mg total) by mouth once a week. Take it 1/2 hour before morning furosemide Take 1 extra potassium on the day you take this, Disp: 4 tablet, Rfl: 5    metoprolol tartrate (LOPRESSOR) 25 MG tablet, Take 25 mg by mouth 2 (two) times daily., Disp: , Rfl:    montelukast (SINGULAIR) 10 MG tablet, Take 10 mg by mouth at bedtime., Disp: , Rfl:    pantoprazole (PROTONIX) 40 MG tablet, Take 1 tablet (40 mg total) by mouth 2 (two) times daily., Disp: 60 tablet, Rfl: 0   phenazopyridine (PYRIDIUM) 200 MG tablet, Take 1 tablet (200 mg total) by mouth 3 (three) times daily as needed (bladder pain)., Disp: 10 tablet, Rfl: 0   polyethylene glycol (MIRALAX / GLYCOLAX) 17 g packet, Take 17 g by mouth daily as needed for mild constipation or moderate  constipation., Disp: 14 each, Rfl: 0   potassium chloride SA (KLOR-CON M) 20 MEQ tablet, Take 1 tablet (20 mEq total) by mouth daily., Disp: 30 tablet, Rfl: 5   SYMBICORT 160-4.5 MCG/ACT inhaler, Inhale 2 puffs into the lungs 2 (two) times daily as needed (shortness of breath)., Disp: , Rfl:    tamsulosin (FLOMAX) 0.4 MG CAPS capsule, TAKE 1 CAPSULE BY MOUTH EVERY DAY, Disp: 14 capsule, Rfl: 0   umeclidinium-vilanterol (ANORO ELLIPTA) 62.5-25 MCG/ACT AEPB, Inhale 1 puff into the lungs daily., Disp: , Rfl:    COUNSELING POINTS/CLINICAL PEARLS   DRUGS TO CAUTION IN HEART FAILURE  Drug or Class Mechanism  Analgesics NSAIDs COX-2 inhibitors Glucocorticoids  Sodium and water retention, increased systemic vascular resistance, decreased response to diuretics   Diabetes Medications Metformin Thiazolidinediones Rosiglitazone (Avandia) Pioglitazone (Actos) DPP4 Inhibitors Saxagliptin (Onglyza) Sitagliptin (Januvia)   Lactic acidosis Possible calcium channel blockade   Unknown  Antiarrhythmics Class I  Flecainide Disopyramide Class III Sotalol Other Dronedarone  Negative inotrope, proarrhythmic   Proarrhythmic, beta blockade  Negative inotrope  Antihypertensives Alpha Blockers Doxazosin Calcium Channel Blockers Diltiazem Verapamil Nifedipine Central Alpha Adrenergics Moxonidine Peripheral  Vasodilators Minoxidil  Increases renin and aldosterone  Negative inotrope    Possible sympathetic withdrawal  Unknown  Anti-infective Itraconazole Amphotericin B  Negative inotrope Unknown  Hematologic Anagrelide Cilostazol   Possible inhibition of PD IV Inhibition of PD III causing arrhythmias  Neurologic/Psychiatric Stimulants Anti-Seizure Drugs Carbamazepine Pregabalin Antidepressants Tricyclics Citalopram Parkinsons Bromocriptine Pergolide Pramipexole Antipsychotics Clozapine Antimigraine Ergotamine Methysergide Appetite suppressants Bipolar Lithium  Peripheral alpha and beta agonist activity  Negative inotrope and chronotrope Calcium channel blockade  Negative inotrope, proarrhythmic Dose-dependent QT prolongation  Excessive serotonin activity/valvular damage Excessive serotonin activity/valvular damage Unknown  IgE mediated hypersensitivy, calcium channel blockade  Excessive serotonin activity/valvular damage Excessive serotonin activity/valvular damage Valvular damage  Direct myofibrillar degeneration, adrenergic stimulation  Antimalarials Chloroquine Hydroxychloroquine Intracellular inhibition of lysosomal enzymes  Urologic Agents Alpha Blockers Doxazosin Prazosin Tamsulosin Terazosin  Increased renin and aldosterone  Adapted from Page Carleene Overlie, et al. Drugs That May Cause or Exacerbate Heart Failure: A Scientific Statement from the American Heart  Association. Circulation 2016; 134:e32-e69. DOI: 10.1161/CIR.0000000000000426   MEDICATION ADHERENCES TIPS AND STRATEGIES Taking medication as prescribed improves patient outcomes in heart failure (reduces hospitalizations, improves symptoms, increases survival) Side effects of medications can be managed by decreasing doses, switching agents, stopping drugs, or adding additional therapy. Please let someone in the [facility] know if you have having bothersome side effects so we can  modify your regimen. Do not alter your medication regimen without talking to [place].  Medication reminders can help patients remember to take drugs on time. If you are missing or forgetting doses you can try linking behaviors, using pill boxes, or an electronic reminder like an alarm on your phone or an app. Some people can also get automated phone calls as medication reminders.

## 2021-02-04 DIAGNOSIS — R0602 Shortness of breath: Secondary | ICD-10-CM | POA: Diagnosis not present

## 2021-02-05 DIAGNOSIS — R300 Dysuria: Secondary | ICD-10-CM | POA: Diagnosis not present

## 2021-02-05 DIAGNOSIS — J432 Centrilobular emphysema: Secondary | ICD-10-CM | POA: Diagnosis not present

## 2021-02-05 DIAGNOSIS — J181 Lobar pneumonia, unspecified organism: Secondary | ICD-10-CM | POA: Diagnosis not present

## 2021-02-05 DIAGNOSIS — R918 Other nonspecific abnormal finding of lung field: Secondary | ICD-10-CM | POA: Diagnosis not present

## 2021-02-05 DIAGNOSIS — J984 Other disorders of lung: Secondary | ICD-10-CM | POA: Diagnosis not present

## 2021-02-05 DIAGNOSIS — Z6823 Body mass index (BMI) 23.0-23.9, adult: Secondary | ICD-10-CM | POA: Diagnosis not present

## 2021-02-05 DIAGNOSIS — R222 Localized swelling, mass and lump, trunk: Secondary | ICD-10-CM | POA: Diagnosis not present

## 2021-02-05 DIAGNOSIS — J8410 Pulmonary fibrosis, unspecified: Secondary | ICD-10-CM | POA: Diagnosis not present

## 2021-02-05 DIAGNOSIS — R188 Other ascites: Secondary | ICD-10-CM | POA: Diagnosis not present

## 2021-02-05 DIAGNOSIS — I2510 Atherosclerotic heart disease of native coronary artery without angina pectoris: Secondary | ICD-10-CM | POA: Diagnosis not present

## 2021-02-05 DIAGNOSIS — I2584 Coronary atherosclerosis due to calcified coronary lesion: Secondary | ICD-10-CM | POA: Diagnosis not present

## 2021-02-11 DIAGNOSIS — E785 Hyperlipidemia, unspecified: Secondary | ICD-10-CM | POA: Diagnosis not present

## 2021-02-11 DIAGNOSIS — I509 Heart failure, unspecified: Secondary | ICD-10-CM | POA: Diagnosis not present

## 2021-02-11 DIAGNOSIS — Z6823 Body mass index (BMI) 23.0-23.9, adult: Secondary | ICD-10-CM | POA: Diagnosis not present

## 2021-02-11 DIAGNOSIS — D500 Iron deficiency anemia secondary to blood loss (chronic): Secondary | ICD-10-CM | POA: Diagnosis not present

## 2021-02-11 DIAGNOSIS — E782 Mixed hyperlipidemia: Secondary | ICD-10-CM | POA: Diagnosis not present

## 2021-02-11 DIAGNOSIS — I10 Essential (primary) hypertension: Secondary | ICD-10-CM | POA: Diagnosis not present

## 2021-02-11 DIAGNOSIS — J449 Chronic obstructive pulmonary disease, unspecified: Secondary | ICD-10-CM | POA: Diagnosis not present

## 2021-02-11 DIAGNOSIS — N390 Urinary tract infection, site not specified: Secondary | ICD-10-CM | POA: Diagnosis not present

## 2021-02-12 DIAGNOSIS — I4891 Unspecified atrial fibrillation: Secondary | ICD-10-CM | POA: Diagnosis not present

## 2021-02-12 DIAGNOSIS — K219 Gastro-esophageal reflux disease without esophagitis: Secondary | ICD-10-CM | POA: Diagnosis not present

## 2021-02-12 DIAGNOSIS — E782 Mixed hyperlipidemia: Secondary | ICD-10-CM | POA: Diagnosis not present

## 2021-02-12 DIAGNOSIS — J449 Chronic obstructive pulmonary disease, unspecified: Secondary | ICD-10-CM | POA: Diagnosis not present

## 2021-02-12 DIAGNOSIS — I10 Essential (primary) hypertension: Secondary | ICD-10-CM | POA: Diagnosis not present

## 2021-02-12 DIAGNOSIS — I340 Nonrheumatic mitral (valve) insufficiency: Secondary | ICD-10-CM | POA: Diagnosis not present

## 2021-02-13 ENCOUNTER — Other Ambulatory Visit: Payer: Self-pay | Admitting: Family

## 2021-02-15 DIAGNOSIS — Z6823 Body mass index (BMI) 23.0-23.9, adult: Secondary | ICD-10-CM | POA: Diagnosis not present

## 2021-02-15 DIAGNOSIS — J181 Lobar pneumonia, unspecified organism: Secondary | ICD-10-CM | POA: Diagnosis not present

## 2021-02-15 DIAGNOSIS — N390 Urinary tract infection, site not specified: Secondary | ICD-10-CM | POA: Diagnosis not present

## 2021-02-17 ENCOUNTER — Ambulatory Visit (INDEPENDENT_AMBULATORY_CARE_PROVIDER_SITE_OTHER): Payer: Medicare HMO | Admitting: Internal Medicine

## 2021-02-17 ENCOUNTER — Encounter: Payer: Self-pay | Admitting: Internal Medicine

## 2021-02-17 ENCOUNTER — Other Ambulatory Visit: Payer: Self-pay

## 2021-02-17 VITALS — BP 122/82 | HR 61 | Temp 97.8°F | Ht 74.0 in | Wt 184.4 lb

## 2021-02-17 DIAGNOSIS — J9611 Chronic respiratory failure with hypoxia: Secondary | ICD-10-CM

## 2021-02-17 DIAGNOSIS — J432 Centrilobular emphysema: Secondary | ICD-10-CM | POA: Diagnosis not present

## 2021-02-17 DIAGNOSIS — J441 Chronic obstructive pulmonary disease with (acute) exacerbation: Secondary | ICD-10-CM

## 2021-02-17 DIAGNOSIS — J449 Chronic obstructive pulmonary disease, unspecified: Secondary | ICD-10-CM | POA: Diagnosis not present

## 2021-02-17 DIAGNOSIS — R0689 Other abnormalities of breathing: Secondary | ICD-10-CM | POA: Diagnosis not present

## 2021-02-17 MED ORDER — SPIRIVA RESPIMAT 2.5 MCG/ACT IN AERS: 2.0000 | INHALATION_SPRAY | Freq: Every day | RESPIRATORY_TRACT | 0 refills | Status: DC

## 2021-02-17 MED ORDER — BREZTRI AEROSPHERE 160-9-4.8 MCG/ACT IN AERO: 2.0000 | INHALATION_SPRAY | Freq: Two times a day (BID) | RESPIRATORY_TRACT | 0 refills | Status: DC

## 2021-02-17 MED ORDER — SPIRIVA RESPIMAT 2.5 MCG/ACT IN AERS: 2.0000 | INHALATION_SPRAY | Freq: Every day | RESPIRATORY_TRACT | 8 refills | Status: DC

## 2021-02-17 MED ORDER — BREZTRI AEROSPHERE 160-9-4.8 MCG/ACT IN AERO: 2.0000 | INHALATION_SPRAY | Freq: Two times a day (BID) | RESPIRATORY_TRACT | 8 refills | Status: DC

## 2021-02-17 NOTE — Progress Notes (Signed)
[name] Browning, male    DOB: 10-Sep-1940,  MRN: [id number]   SYNOPSIS 81 yowm quit 2005  referred to pulmonary clinic in [place]  02/17/2021 by Dr Carola Frost NP    Baseline able to do yardwork pushing weed eating s needing any kind on inhaler but maybe once or twice a week needed inhaler then admitted with covid Jan 2021 and d/c did not need 02 but then readmitted on 03/25/19 with CAP and worse  then  last admit for abdominal pain:     HOSPITALIZATION 11/2020 81 year old male with history of CAD (nonobstructive calcified coronary disease), atrial fibrillation on Eliquis, HFpEF, hypertension, TIA in 2016, COPD on 2 L who was admitted to the hospital with shortness of breath.  Cardiology is consulted for evaluation of an elevated troponin. Since he was discharged on 10/28, and his medications were changed at discharge. His metoprolol XL 25 mg was stopped, though they restarted this because his HR was high at home. His lasix was also stopped. He has been having increased shortness of breath, and increased LE edema. He was having some black colored stools as well.    He is admitted to the hospital 2 times in the last month.   First admission was from 10/20/2020 to 10/31/2020 when he was admitted with respiratory failure due to COPD and pneumonia, as well as an obstructive left renal stone.  He developed hypotension and worsening respiratory distress requiring pressors and BiPAP.     Blood cultures grew positive for Enterococcus faecalis and staph hemolyticus.  He was treated with antibiotics ending 11/03/2020.  The patient was recently admitted to the hospital from 10/25-10/28 with complaints of dysphagia.     He was seen by gastroenterology who underwent an EGD with no significant findings.  Since that time he has had progressive worsening shortness of breath, eventually presented to the emergency department where he was found to have an oxygen saturation of 88% on his home 2 L of oxygen.  His labs are  otherwise notable for a troponin of 620, BNP of 220, hemoglobin of 8.2 (9.7 on 11/06/2020).  Chest x-ray shows diffuse bilateral infiltrates which could be consistent with edema but some overlying scar.   Pertinent  Medical History  COPD Ischemic Cardiomyopathy   Significant Hospital Events: Including procedures, antibiotic start and stop dates in addition to other pertinent events   11/5 acute SOB and DOE on biPAP CODE STEMI 11/5 s/p CATH chronic RCA occlusion, demand ischemia causing MI 11/6 remains in cardiogenic shock, severe hypoxia 11/7 remains on low-dose levophed and continuous heparin infusion 11/8 off pressors this am but restarted (levophed switched to dopamine) after patient became hypotensive after given metoprolol, off heparin gtt   CC Follow COPD  History of Present Illness  Severe SOB and DOE  No exacerbation at this time No evidence of heart failure at this time No evidence or signs of infection at this time No respiratory distress No fevers, chills, nausea, vomiting, diarrhea No evidence of lower extremity edema No evidence hemoptysis  Patient has progressive resp disease    Past Medical History:  Diagnosis Date   Anginal pain ([place])    Anxiety    Aortic atherosclerosis ([place])    Atrial fibrillation ([place]) 01/2019   Bilateral carpal tunnel syndrome    CAD (coronary artery disease)    a.) LHC 2004 --> normal coronaries. b.) normal stress test in 2007 and 2011; c.) Lexiscan 05/20/2014 --> LVEF 55-65%; no significant stress induced ischemia/arrythmia. d.)  CT chest 03/25/2019 --> coronaries carcified.   Carotid atherosclerosis, bilateral    Carpal tunnel syndrome, bilateral    CHF (congestive heart failure) (HCC)    Chronic anticoagulation    a.) ASA + apixaban   COPD (chronic obstructive pulmonary disease) (HCC)    CVA (cerebral vascular accident) ([place])    Degenerative disc disease, cervical    Diastolic dysfunction    a.) TTE 05/29/2014 --> LVEF 60-65%;  G1DD.   DVT (deep venous thrombosis) (HCC)    GERD (gastroesophageal reflux disease)    History of 2019 novel coronavirus disease (COVID-19) 02/08/2019   HLD (hyperlipidemia)    Hypertension    Kidney stones    Osteoarthritis of right shoulder    Pneumonia    Respiratory failure, acute ([place]) 09/20/2020   a.) severe respiratory distress 1 hour after urological surgery. CXR (+) for acute pulmonary edema. Transferred to ICU and placed on NIPPV. Questionable aspiration PNA. (+) A.fib with RVR. Improved with ABX, diuresis, and amiodarone.   Skin cancer of face    a.) RIGHT ear and RIGHT forehead; excised.   Syncope    TIA (transient ischemic attack) 2016   Valvular regurgitation    a.) TTE 05/26/2014 --> LVEF 60-65%; trivial MR, mild TR; no AR or PR. b.) TTE 04/20/2015 --> LVEF 55-60%; trivial MR and PR; no AR or TR.    Outpatient Medications Prior to Visit  Medication Sig Dispense Refill   acetaminophen (TYLENOL) 500 MG tablet Take 2 tablets (1,000 mg total) by mouth every 6 (six) hours as needed for mild pain.     amLODipine (NORVASC) 5 MG tablet Take 1 tablet (5 mg total) by mouth daily after supper. 30 tablet 2   apixaban (ELIQUIS) 5 MG TABS tablet Take 1 tablet (5 mg total) by mouth 2 (two) times daily. 60 tablet 2   aspirin EC 81 MG tablet Take 81 mg by mouth daily after supper.     atorvastatin (LIPITOR) 40 MG tablet Take 40 mg by mouth daily after supper.     Ferrous Gluconate (IRON) 240 (27 Fe) MG TABS Take 27 mg by mouth daily after supper.     furosemide (LASIX) 20 MG tablet Take 1 tablet (20 mg total) by mouth daily after breakfast. 30 tablet 2   guaiFENesin-dextromethorphan (ROBITUSSIN DM) 100-10 MG/5ML syrup Take 5 mLs by mouth every 4 (four) hours as needed for cough. 118 mL 0   losartan (COZAAR) 100 MG tablet Take 100 mg by mouth daily after supper.     montelukast (SINGULAIR) 10 MG tablet Take 10 mg by mouth at bedtime.     pantoprazole (PROTONIX) 40 MG tablet Take 40 mg by  mouth daily after supper.     phenazopyridine (PYRIDIUM) 200 MG tablet Take 1 tablet (200 mg total) by mouth 3 (three) times daily as needed (bladder pain). 10 tablet 0   polyethylene glycol (MIRALAX / GLYCOLAX) 17 g packet Take 17 g by mouth daily as needed for mild constipation or moderate constipation. (Patient taking differently: Take 17 g by mouth daily.) 14 each 0                  No facility-administered medications prior to visit.    Review of Systems:  Gen:  Denies  fever, sweats, chills weight loss  HEENT: Denies blurred vision, double vision, ear pain, eye pain, hearing loss, nose bleeds, sore throat Cardiac:  No dizziness, chest pain or heaviness, chest tightness,edema, No JVD Resp:   +  cough, +sputum production, +shortness of breath,+wheezing, -hemoptysis,  Other:  All other systems negative   Objective:     BP 122/82 (BP Location: Left Arm, Patient Position: Sitting, Cuff Size: Normal)    Pulse 61    Temp 97.8 F (36.6 C) (Oral)    Ht 6\' 2"  (1.88 m)    Wt 184 lb 6.4 oz (83.6 kg)    SpO2 92%    BMI 23.68 kg/m   SpO2: 92 % O2 Type: Continuous O2 O2 Flow Rate (L/min): 3 L/min   Physical Examination:   General Appearance: No distress  EYES PERRLA, EOM intact.   NECK Supple, No JVD Pulmonary: normal breath sounds, + Last chest wheezing.  CardiovascularNormal S1,S2.  No m/r/g.    ALL OTHER ROS ARE NEGATIVE    Labs ordered/ reviewed:      Chemistry      Component Value Date/Time   NA 137 01/27/2021 0948   NA 143 06/01/2015 1049   NA 144 08/12/2013 0546   K 3.0 (L) 01/27/2021 0948   K 4.5 08/12/2013 0546   CL 93 (L) 01/27/2021 0948   CL 113 (H) 08/12/2013 0546   CO2 36 (H) 01/27/2021 0948   CO2 26 08/12/2013 0546   BUN 32 (H) 01/27/2021 0948   BUN 16 06/01/2015 1049   BUN 25 (H) 08/12/2013 0546   CREATININE 1.23 01/27/2021 0948   CREATININE 1.00 06/08/2016 1044      Component Value Date/Time   CALCIUM 9.5 01/27/2021 0948   CALCIUM 8.3 (L)  08/12/2013 0546   ALKPHOS 90 11/14/2020 0710   ALKPHOS 112 08/10/2013 1703   AST 156 (H) 11/14/2020 0710   AST 27 08/10/2013 1703   ALT 170 (H) 11/14/2020 0710   ALT 22 08/10/2013 1703   BILITOT 1.6 (H) 11/14/2020 0710   BILITOT 0.5 06/01/2015 1049   BILITOT 0.7 08/10/2013 1703        Lab Results  Component Value Date   WBC 12.6 (H) 12/05/2020   HGB 8.6 (L) 12/05/2020   HCT 28.6 (L) 12/05/2020   MCV 92.9 12/05/2020   PLT 244 12/05/2020      EOS                                                     0.2                             02/17/2021   No results found for: DDIMER    Lab Results  Component Value Date   TSH 4.277 10/06/2020     BNP   02/17/2021   = 176     Lab Results  Component Value Date   ESRSEDRATE 8 10/06/2020          Assessment   81 year old white male with COPD with several bouts of COVID-19 infection over the last 6 to 8 months with progressive respiratory insufficiency and progressive hypoxic respiratory failure with grade 1 diastolic dysfunction  COPD progressed to end-stage Recommend starting Breztri inhaler Recommend starting Spiriva Respimat 2.5 okay Patient is intolerant of albuterol Patient does have some wheezing will give prednisone 20 mg daily for 10 days  Chronic hypoxic respiratory failure Continue oxygen as prescribed Patient uses and benefits from oxygen therapy He needs this for survival  Respiratory insufficiency Patient may need alternative therapy with nebulized medication    MEDICATION ADJUSTMENTS/LABS AND TESTS ORDERED:  START BREZTRI CONTINUE OXYGEN AS PRESCRIBED RECOMMEND PULMONARY REHAB  CURRENT MEDICATIONS REVIEWED AT LENGTH WITH PATIENT TODAY   Patient  satisfied with Plan of action and management. All questions answered  Follow up 3 months  Total Time Spent  45 mins   Maretta Bees Patricia Pesa, M.D.  [facility]  Medical Director [place] Director [facility]  Cardio-Pulmonary Department

## 2021-02-17 NOTE — Patient Instructions (Addendum)
START BREZTRI CONTINUE OXYGEN AS PRESCRIBED RECOMMEND PULMONARY REHAB

## 2021-02-18 ENCOUNTER — Ambulatory Visit: Payer: Medicare HMO | Admitting: Internal Medicine

## 2021-02-26 DIAGNOSIS — K219 Gastro-esophageal reflux disease without esophagitis: Secondary | ICD-10-CM | POA: Diagnosis not present

## 2021-02-26 DIAGNOSIS — I340 Nonrheumatic mitral (valve) insufficiency: Secondary | ICD-10-CM | POA: Diagnosis not present

## 2021-02-26 DIAGNOSIS — J449 Chronic obstructive pulmonary disease, unspecified: Secondary | ICD-10-CM | POA: Diagnosis not present

## 2021-02-26 DIAGNOSIS — I10 Essential (primary) hypertension: Secondary | ICD-10-CM | POA: Diagnosis not present

## 2021-02-26 DIAGNOSIS — R0602 Shortness of breath: Secondary | ICD-10-CM | POA: Diagnosis not present

## 2021-02-26 DIAGNOSIS — I4891 Unspecified atrial fibrillation: Secondary | ICD-10-CM | POA: Diagnosis not present

## 2021-02-26 DIAGNOSIS — E782 Mixed hyperlipidemia: Secondary | ICD-10-CM | POA: Diagnosis not present

## 2021-02-27 NOTE — Progress Notes (Signed)
Patient ID: [name] Browning, male    DOB: 1940/03/25, 81 y.o.   MRN: [id number]  HPI  [name] Browning is a 81 y/o male with a history of carotid disease, CAD, hyperlipidemia, HTN, CKD, stroke, anxiety, atrial fibrillation, COPD, DVT, GERD, previous tobacco use and chronic heart failure.   Echo report from 11/15/20 reviewed and showed an EF of 60-65% along with severe LAE and mild [name].   LHC done 11/14/20 showed: Ost RCA lesion is 95% stenosed.   Prox RCA to Dist RCA lesion is 100% stenosed. -Very minimal flow.  After initial angiography, it disappeared to progressed to the ostium.   Left-to-right collaterals via septal perforators fill the PDA system.   Mid LM to Dist LM lesion is 20% stenosed.   Ost LAD to Prox LAD lesion is 25% stenosed.  Relatively small caliber vessel that wraps the apex.   Diminutive LCx with moderate sized OM1, mild disease.   The left ventricular systolic function is normal. No obvious regional wall motion normalities.   The left ventricular ejection fraction is 55-65% by visual estimate.   LV end diastolic pressure is moderately elevated.  18 mmHg   There is no aortic valve stenosis.  Was in the ED 01/09/21 due to left-sided epistaxis. While being triaged, nosebleed stopped with pressure and he was released. Recent admission at [facility]. Admitted 11/11/20 due to shortness of breath due to acute on chronic HF. Initially treated with IV lasix with transition to oral diuretics. Elevated troponin. Eliquis held due to GIB. GI and cardiology consults obtained. Started on IV protonix. Developed cardiogenic shock and treated with levophed. Renal function worsened due to dehydration and contrast dye but improved with IV hydration. Antibiotics started due to UTI. Discharged after 21 days. Admitted twice in October.   He presents today for a follow-up visit with a chief complaint of moderate shortness of breath with minimal exertion. He describes this as chronic in nature having been present  for several years although feels like it's been worsening over the last few weeks. He has associated fatigue, decrease appetite (due to loss of taste), cough, intermittent chest pain, palpitations, dizziness, weakness, easy bruising, chronic difficulty sleeping, vomiting and gradual weight gain along with this. He denies any abdominal distention, pedal edema or wheezing.   Saw cardiology Humphrey Rolls) on 02/18/21 and is having an xray (chest, patient thinks) today with f/u in his office later this week. Did a home covid test ~ 2 weeks ago and reports it as a negative test.   We decreased his daily furosemide to once daily at last visit with PRN PM dose if needed. He says that he hasn't had to take any PM doses of furosemide since he was last here. Continues on weekly metolazone with additional potassium.    Past Medical History:  Diagnosis Date   Anginal pain ([place])    Anxiety    Aortic atherosclerosis ([place])    Atrial fibrillation ([place]) 01/2019   Bilateral carpal tunnel syndrome    CAD (coronary artery disease)    a.) LHC 2004 --> normal coronaries. b.) normal stress test in 2007 and 2011; c.) Lexiscan 05/20/2014 --> LVEF 55-65%; no significant stress induced ischemia/arrythmia. d.) CT chest 03/25/2019 --> coronaries carcified.   Carotid atherosclerosis, bilateral    Carpal tunnel syndrome, bilateral    CHF (congestive heart failure) (HCC)    Chronic anticoagulation    a.) ASA + apixaban   COPD (chronic obstructive pulmonary disease) ([place])    CVA (  cerebral vascular accident) ([place])    Degenerative disc disease, cervical    Diastolic dysfunction    a.) TTE 05/29/2014 --> LVEF 60-65%; G1DD.   DVT (deep venous thrombosis) (HCC)    GERD (gastroesophageal reflux disease)    History of 2019 novel coronavirus disease (COVID-19) 02/08/2019   HLD (hyperlipidemia)    Hypertension    Kidney stones    Osteoarthritis of right shoulder    Pneumonia    Respiratory failure, acute ([place]) 09/20/2020   a.) severe  respiratory distress 1 hour after urological surgery. CXR (+) for acute pulmonary edema. Transferred to ICU and placed on NIPPV. Questionable aspiration PNA. (+) A.fib with RVR. Improved with ABX, diuresis, and amiodarone.   Skin cancer of face    a.) RIGHT ear and RIGHT forehead; excised.   Syncope    TIA (transient ischemic attack) 2016   Valvular regurgitation    a.) TTE 05/26/2014 --> LVEF 60-65%; trivial [name], mild TR; no AR or PR. b.) TTE 04/20/2015 --> LVEF 55-60%; trivial [name] and PR; no AR or TR.   Past Surgical History:  Procedure Laterality Date   CARDIAC CATHETERIZATION  2004   CARPAL TUNNEL RELEASE Right 06/11/2013   CENTRAL LINE INSERTION  11/14/2020   Procedure: CENTRAL LINE INSERTION;  Surgeon: Leonie Man, MD;  Location: [facility];  Service: Cardiovascular;;   CORONARY/GRAFT ACUTE MI REVASCULARIZATION N/A 11/14/2020   Procedure: Coronary/Graft Acute MI Revascularization;  Surgeon: Leonie Man, MD;  Location: [facility];  Service: Cardiovascular;  Laterality: N/A;   CYSTOSCOPY W/ RETROGRADES  10/16/2020   Procedure: CYSTOSCOPY WITH RETROGRADE PYELOGRAM;  Surgeon: Billey Co, MD;  Location: [facility];  Service: Urology;;   CYSTOSCOPY W/ URETERAL STENT PLACEMENT Left 09/20/2020   Procedure: CYSTOSCOPY WITH RETROGRADE PYELOGRAM/URETERAL STENT PLACEMENT;  Surgeon: Janith Lima, MD;  Location: [facility];  Service: Urology;  Laterality: Left;   CYSTOSCOPY/URETEROSCOPY/HOLMIUM LASER/STENT PLACEMENT Left 10/16/2020   Procedure: CYSTOSCOPY/URETEROSCOPY/HOLMIUM LASER/STENT PLACEMENT;  Surgeon: Billey Co, MD;  Location: [facility];  Service: Urology;  Laterality: Left;   ESOPHAGOGASTRODUODENOSCOPY N/A 11/06/2020   Procedure: ESOPHAGOGASTRODUODENOSCOPY (EGD);  Surgeon: Lucilla Lame, MD;  Location: [facility];  Service: Endoscopy;  Laterality: N/A;   LEFT HEART CATH AND CORONARY ANGIOGRAPHY N/A 11/14/2020   Procedure: LEFT HEART CATH AND CORONARY  ANGIOGRAPHY;  Surgeon: Leonie Man, MD;  Location: [facility];  Service: Cardiovascular;  Laterality: N/A;   SHOULDER ARTHROSCOPY WITH OPEN ROTATOR CUFF REPAIR Right 08/24/2017   Procedure: SHOULDER ARTHROSCOPY WITH OPEN ROTATOR CUFF REPAIR;  Surgeon: Corky Mull, MD;  Location: [facility];  Service: Orthopedics;  Laterality: Right;   SKIN CANCER EXCISION  12/01/2016   right ear    SKIN CANCER EXCISION     remove from the right side of the face    THROMBECTOMY Right 2004   leg   THYROIDECTOMY  1950   Not sure if total or partial thyroidectomy.   Family History  Problem Relation Age of Onset   Hypertension Mother    Heart disease Mother    CAD Father    Heart attack Father    Social History   Tobacco Use   Smoking status: Former    Packs/day: 1.00    Years: 46.00    Pack years: 46.00    Types: Cigarettes    Start date: 01/10/1957    Quit date: 02/11/2003    Years since quitting: 18.0   Smokeless tobacco: Former  Types: Snuff    Quit date: 02/11/2003  Substance Use Topics   Alcohol use: Not Currently    Alcohol/week: 7.0 standard drinks    Types: 7 Cans of beer per week   Allergies  Allergen Reactions   Hydromorphone     Hallucination Pt reports he doesn't know about this   Prior to Admission medications   Medication Sig Start Date End Date Taking? Authorizing Provider  acetaminophen (TYLENOL) 500 MG tablet Take 2 tablets (1,000 mg total) by mouth every 6 (six) hours as needed for mild pain. 05/07/20  Yes Piscoya, Jacqulyn Bath, MD  albuterol (VENTOLIN HFA) 108 (90 Base) MCG/ACT inhaler Inhale into the lungs every 6 (six) hours as needed for wheezing or shortness of breath.   Yes [provider]  apixaban (ELIQUIS) 5 MG TABS tablet Take 1 tablet (5 mg total) by mouth 2 (two) times daily. 09/28/20  Yes Pokhrel, Laxman, MD  aspirin EC 81 MG tablet Take 81 mg by mouth daily after supper.   Yes [provider]  atorvastatin (LIPITOR) 40 MG tablet Take 40  mg by mouth daily after supper.   Yes [provider]  Budeson-Glycopyrrol-Formoterol (BREZTRI AEROSPHERE) 160-9-4.8 MCG/ACT AERO Inhale 2 puffs into the lungs in the morning and at bedtime. 02/17/21  Yes Kasa, Maretta Bees, MD  Budeson-Glycopyrrol-Formoterol (BREZTRI AEROSPHERE) 160-9-4.8 MCG/ACT AERO Inhale 2 puffs into the lungs in the morning and at bedtime. 02/17/21  Yes Flora Lipps, MD  cetirizine (ZYRTEC) 10 MG tablet Take 10 mg by mouth daily. 09/29/20  Yes [provider]  docusate sodium (COLACE) 100 MG capsule Take 1 capsule (100 mg total) by mouth 2 (two) times daily. 12/02/20 12/02/21 Yes Lavina Hamman, MD  famotidine (PEPCID) 20 MG tablet One after supper 10/06/20  Yes Tanda Rockers, MD  Ferrous Gluconate (IRON) 240 (27 Fe) MG TABS Take 27 mg by mouth daily after supper. 09/21/18  Yes [provider]  furosemide (LASIX) 40 MG tablet Take 40 mg by mouth daily.   Yes [provider]  isosorbide mononitrate (IMDUR) 30 MG 24 hr tablet Take 0.5 tablets (15 mg total) by mouth daily. 12/03/20  Yes Lavina Hamman, MD  lactulose (CHRONULAC) 10 GM/15ML solution Take 30 mLs (20 g total) by mouth 2 (two) times daily as needed for moderate constipation. 12/02/20  Yes Lavina Hamman, MD  metolazone (ZAROXOLYN) 2.5 MG tablet Take 1 tablet (2.5 mg total) by mouth once a week. Take it 1/2 hour before morning furosemide Take 1 extra potassium on the day you take this 12/28/20 03/28/21 Yes Darylene Price A, FNP  metoprolol tartrate (LOPRESSOR) 25 MG tablet Take 25 mg by mouth 2 (two) times daily.   Yes [provider]  montelukast (SINGULAIR) 10 MG tablet Take 10 mg by mouth at bedtime.   Yes [provider]  pantoprazole (PROTONIX) 40 MG tablet Take 1 tablet (40 mg total) by mouth 2 (two) times daily. 12/02/20  Yes Lavina Hamman, MD  phenazopyridine (PYRIDIUM) 200 MG tablet Take 1 tablet (200 mg total) by mouth 3 (three) times daily as needed (bladder pain).  09/28/20  Yes Pokhrel, Laxman, MD  polyethylene glycol (MIRALAX / GLYCOLAX) 17 g packet Take 17 g by mouth daily as needed for mild constipation or moderate constipation. 09/28/20  Yes Pokhrel, Laxman, MD  potassium chloride SA (KLOR-CON M) 20 MEQ tablet Take 1 tablet (20 mEq total) by mouth daily. 12/18/20  Yes Alisa Graff, FNP  SYMBICORT 160-4.5 MCG/ACT inhaler  Inhale 2 puffs into the lungs 2 (two) times daily as needed (shortness of breath). 12/07/18  Yes [provider]  tamsulosin (FLOMAX) 0.4 MG CAPS capsule TAKE 1 CAPSULE BY MOUTH EVERY DAY 11/09/20  Yes Billey Co, MD  umeclidinium-vilanterol (ANORO ELLIPTA) 62.5-25 MCG/ACT AEPB Inhale 1 puff into the lungs daily.   Yes [provider]  amoxicillin (AMOXIL) 250 MG/5 ML SUSP Take 10 mg/kg by mouth every 8 (eight) hours. Patient not taking: Reported on 03/01/2021    [provider]    Review of Systems  Constitutional:  Positive for appetite change (decreased appetite due to no taste) and fatigue (easily).  HENT:  Negative for congestion, rhinorrhea and sore throat.   Eyes: Negative.   Respiratory:  Positive for cough (productive) and shortness of breath (worsening). Negative for chest tightness and wheezing.   Cardiovascular:  Positive for chest pain and palpitations (at times). Negative for leg swelling.  Gastrointestinal:  Positive for vomiting (a few times this week). Negative for abdominal distention and abdominal pain.  Endocrine: Negative.   Genitourinary: Negative.   Musculoskeletal:  Negative for back pain and neck pain.  Skin: Negative.   Allergic/Immunologic: Negative.   Neurological:  Positive for dizziness (when standing) and weakness. Negative for light-headedness.  Hematological:  Negative for adenopathy. Bruises/bleeds easily.  Psychiatric/Behavioral:  Positive for sleep disturbance (sleeping in recliner). Negative for dysphoric mood. The patient is not nervous/anxious.    Vitals:    03/01/21 1038  BP: 112/68  Pulse: (!) 54  Resp: 16  SpO2: 94%  Weight: 185 lb 3 oz (84 kg)  Height: 6\' 2"  (1.88 m)   Wt Readings from Last 3 Encounters:  03/01/21 185 lb 3 oz (84 kg)  02/17/21 184 lb 6.4 oz (83.6 kg)  01/27/21 174 lb (78.9 kg)   Lab Results  Component Value Date   CREATININE 1.23 01/27/2021   CREATININE 1.07 12/28/2020   CREATININE 1.12 12/25/2020   Physical Exam Vitals and nursing note reviewed.  Constitutional:      Appearance: Normal appearance.  HENT:     Head: Normocephalic and atraumatic.  Cardiovascular:     Rate and Rhythm: Bradycardia present. Rhythm irregular.  Pulmonary:     Breath sounds: No wheezing, rhonchi or rales.  Abdominal:     General: There is no distension.     Palpations: Abdomen is soft.  Musculoskeletal:     Cervical back: Normal range of motion and neck supple.     Right lower leg: Tenderness present. Edema (trace pitting) present.     Left lower leg: Edema (trace pitting) present.  Skin:    General: Skin is warm and dry.  Neurological:     General: No focal deficit present.     Mental Status: He is alert and oriented to person, place, and time.  Psychiatric:        Mood and Affect: Mood is not anxious.        Behavior: Behavior normal.   Assessment & Plan:  1: Chronic heart failure with preserved ejection fraction with structural changes (LAE)- - NYHA class III - euvolemic today - weighing daily; reminded to call for an overnight weight gain of > 2 pounds or a weekly weight gain of > 5 pounds - weight up 11 pounds from last visit here 1 month ago (although we currently have different office scales); notes gradual rise at home - not adding salt to his food and trying to read food labels for sodium content;  reports decrease appetite due to decreased taste - consider adding entresto/ SGLT2 at future visits if BP allows - BNP 12/28/20 was 511.4   2: HTN- - BP looks good (112/68) - diuretic was decreased to AM with PM  PRN at last visit; he says that he hasn't taken any PM doses - taking metolazone weekly with extra potassium tablet - sees PCP ([place]) but has to schedule a f/u - BMP 01/27/21 reviewed and showed sodium 137, potassium 3.0, creatinine 1.23 and GFR 60 - will recheck BMP today  3: COPD- - saw pulmonology (Kasa) 02/17/21 - wearing oxygen at 4L around the clock  4: Atrial fibrillation- - saw cardiology Stark Klein) 02/18/21 and returns 03/05/21; says that he's going to an xray today   Medication list reviewed.   Return in 3 months, sooner if needed.

## 2021-03-01 ENCOUNTER — Ambulatory Visit (HOSPITAL_BASED_OUTPATIENT_CLINIC_OR_DEPARTMENT_OTHER): Payer: Medicare HMO | Admitting: Family

## 2021-03-01 ENCOUNTER — Other Ambulatory Visit: Payer: Self-pay

## 2021-03-01 ENCOUNTER — Encounter: Payer: Self-pay | Admitting: Family

## 2021-03-01 ENCOUNTER — Other Ambulatory Visit
Admission: RE | Admit: 2021-03-01 | Discharge: 2021-03-01 | Disposition: A | Discharge status: Home / Self Care | Payer: Medicare HMO | Source: Ambulatory Visit | Attending: Family | Admitting: Family

## 2021-03-01 VITALS — BP 112/68 | HR 54 | Resp 16 | Ht 74.0 in | Wt 185.2 lb

## 2021-03-01 DIAGNOSIS — N189 Chronic kidney disease, unspecified: Secondary | ICD-10-CM | POA: Insufficient documentation

## 2021-03-01 DIAGNOSIS — J449 Chronic obstructive pulmonary disease, unspecified: Secondary | ICD-10-CM

## 2021-03-01 DIAGNOSIS — Z87891 Personal history of nicotine dependence: Secondary | ICD-10-CM | POA: Insufficient documentation

## 2021-03-01 DIAGNOSIS — F419 Anxiety disorder, unspecified: Secondary | ICD-10-CM | POA: Insufficient documentation

## 2021-03-01 DIAGNOSIS — I5032 Chronic diastolic (congestive) heart failure: Secondary | ICD-10-CM | POA: Insufficient documentation

## 2021-03-01 DIAGNOSIS — I4891 Unspecified atrial fibrillation: Secondary | ICD-10-CM | POA: Insufficient documentation

## 2021-03-01 DIAGNOSIS — I4811 Longstanding persistent atrial fibrillation: Secondary | ICD-10-CM | POA: Diagnosis not present

## 2021-03-01 DIAGNOSIS — Z8673 Personal history of transient ischemic attack (TIA), and cerebral infarction without residual deficits: Secondary | ICD-10-CM | POA: Insufficient documentation

## 2021-03-01 DIAGNOSIS — I82409 Acute embolism and thrombosis of unspecified deep veins of unspecified lower extremity: Secondary | ICD-10-CM | POA: Insufficient documentation

## 2021-03-01 DIAGNOSIS — E785 Hyperlipidemia, unspecified: Secondary | ICD-10-CM | POA: Insufficient documentation

## 2021-03-01 DIAGNOSIS — I2510 Atherosclerotic heart disease of native coronary artery without angina pectoris: Secondary | ICD-10-CM | POA: Insufficient documentation

## 2021-03-01 DIAGNOSIS — I130 Hypertensive heart and chronic kidney disease with heart failure and stage 1 through stage 4 chronic kidney disease, or unspecified chronic kidney disease: Secondary | ICD-10-CM | POA: Insufficient documentation

## 2021-03-01 DIAGNOSIS — Z9981 Dependence on supplemental oxygen: Secondary | ICD-10-CM | POA: Insufficient documentation

## 2021-03-01 DIAGNOSIS — K219 Gastro-esophageal reflux disease without esophagitis: Secondary | ICD-10-CM | POA: Insufficient documentation

## 2021-03-01 DIAGNOSIS — I10 Essential (primary) hypertension: Secondary | ICD-10-CM | POA: Diagnosis not present

## 2021-03-01 DIAGNOSIS — R0602 Shortness of breath: Secondary | ICD-10-CM | POA: Diagnosis not present

## 2021-03-01 LAB — BASIC METABOLIC PANEL
Anion gap: 14 (ref 5–15)
BUN: 20 mg/dL (ref 8–23)
CO2: 29 mmol/L (ref 22–32)
Calcium: 9.2 mg/dL (ref 8.9–10.3)
Chloride: 97 mmol/L — ABNORMAL LOW (ref 98–111)
Creatinine, Ser: 1.37 mg/dL — ABNORMAL HIGH (ref 0.61–1.24)
GFR, Estimated: 52 mL/min — ABNORMAL LOW (ref >60)
Glucose, Bld: 111 mg/dL — ABNORMAL HIGH (ref 70–99)
Potassium: 3.7 mmol/L (ref 3.5–5.1)
Sodium: 140 mmol/L (ref 135–145)

## 2021-03-01 NOTE — Patient Instructions (Signed)
Continue weighing daily and call for an overnight weight gain of 3 pounds or more or a weekly weight gain of more than 5 pounds.  °

## 2021-03-02 ENCOUNTER — Other Ambulatory Visit: Payer: Self-pay | Admitting: Family

## 2021-03-02 ENCOUNTER — Telehealth: Payer: Self-pay

## 2021-03-02 MED ORDER — FUROSEMIDE 40 MG PO TABS: 80.0000 mg | ORAL_TABLET | Freq: Every day | ORAL | 0 refills | Status: DC

## 2021-03-02 NOTE — Telephone Encounter (Addendum)
Spoke with granddaughter, April. April stated the patient is currently taking 80 MG furosemide in the AM, and 40 MG in the PM, per [facility]. Updated provider on medication dosage and provider stated patient should stop taking the 40 MG PM dose and keep taking 80 MG in the AM. Patient should call if he notices any weight gain, swelling, or shortness of breath. Pt's granddaughter verbalized understanding and stated back medication changes. She had no further questions at this time. Georg Ruddle, RN ----- Message from Alisa Graff, [place] sent at 03/02/2021  8:53 AM EST ----- Labs indicate you could be getting a little dry from your fluid pill. Please decrease your fluid pill to 1/2 tablet daily but if your weight goes up or swelling worsens, you can take the other 1/2 tablet if you need it.

## 2021-03-02 NOTE — Telephone Encounter (Deleted)
-----   Message from Alisa Graff,  sent at 03/02/2021  8:53 AM EST ----- Labs indicate you could be getting a little dry from your fluid pill. Please decrease your fluid pill to 1/2 tablet daily but if your weight goes up or swelling worsens, you can take the other 1/2 tablet if you need it.

## 2021-03-04 ENCOUNTER — Ambulatory Visit (INDEPENDENT_AMBULATORY_CARE_PROVIDER_SITE_OTHER): Payer: Medicare HMO | Admitting: Urology

## 2021-03-04 ENCOUNTER — Other Ambulatory Visit: Payer: Self-pay

## 2021-03-04 ENCOUNTER — Encounter: Payer: Self-pay | Admitting: Urology

## 2021-03-04 VITALS — BP 135/89 | HR 90 | Ht 74.0 in

## 2021-03-04 DIAGNOSIS — R300 Dysuria: Secondary | ICD-10-CM | POA: Diagnosis not present

## 2021-03-04 DIAGNOSIS — N390 Urinary tract infection, site not specified: Secondary | ICD-10-CM | POA: Diagnosis not present

## 2021-03-04 DIAGNOSIS — N200 Calculus of kidney: Secondary | ICD-10-CM

## 2021-03-04 DIAGNOSIS — R918 Other nonspecific abnormal finding of lung field: Secondary | ICD-10-CM | POA: Diagnosis not present

## 2021-03-04 LAB — URINALYSIS, COMPLETE
Bilirubin, UA: NEGATIVE
Ketones, UA: NEGATIVE
Nitrite, UA: POSITIVE — AB
Protein,UA: NEGATIVE
Specific Gravity, UA: 1.015 (ref 1.005–1.030)
Urobilinogen, Ur: 0.2 mg/dL (ref 0.2–1.0)
pH, UA: 7 (ref 5.0–7.5)

## 2021-03-04 LAB — MICROSCOPIC EXAMINATION: RBC, Urine: NONE SEEN /hpf (ref 0–2)

## 2021-03-04 LAB — BLADDER SCAN AMB NON-IMAGING: Scan Result: 190

## 2021-03-04 MED ORDER — NITROFURANTOIN MONOHYD MACRO 100 MG PO CAPS: 100.0000 mg | ORAL_CAPSULE | Freq: Two times a day (BID) | ORAL | 0 refills | Status: AC

## 2021-03-05 DIAGNOSIS — I10 Essential (primary) hypertension: Secondary | ICD-10-CM | POA: Diagnosis not present

## 2021-03-05 DIAGNOSIS — J449 Chronic obstructive pulmonary disease, unspecified: Secondary | ICD-10-CM | POA: Diagnosis not present

## 2021-03-05 DIAGNOSIS — I4891 Unspecified atrial fibrillation: Secondary | ICD-10-CM | POA: Diagnosis not present

## 2021-03-05 DIAGNOSIS — I340 Nonrheumatic mitral (valve) insufficiency: Secondary | ICD-10-CM | POA: Diagnosis not present

## 2021-03-05 DIAGNOSIS — E782 Mixed hyperlipidemia: Secondary | ICD-10-CM | POA: Diagnosis not present

## 2021-03-05 NOTE — Progress Notes (Signed)
° °  03/04/2021 10:09 AM   [name] Browning 11/12/1940 [id number]  Reason for visit: UTI symptoms, history of nephrolithiasis  HPI: Comorbid 81 year old male with history of an infected stone and stent placement who underwent definitive ureteroscopy in October 2022, and stent was left on a Dangler and subsequently removed.  He was admitted for pneumonia/UTI in November 2022, and CT showed no hydronephrosis or ureteral stones.  I personally viewed and interpreted those images.  Urine culture ultimately grew resistant E. coli.  He reports about a month of urinary symptoms of urgency, frequency, and dysuria.  He denies any fevers, chills, flank pain, or gross hematuria.  Urinalysis today appears grossly infected with 11-30 WBCs, 0 RBCs, many bacteria, nitrite positive, 1+ leukocytes.  Will send for culture and atypicals.  Based on his prior resistant E. coli susceptibilities, I recommended 2 weeks of nitrofurantoin 100 mg twice daily and will follow-up cultures.  Nitrofurantoin 100 mg twice daily x14 days RTC 6 weeks symptom check, repeat PVR   Billey Co, MD  [facility] [address], [place], [place] [address] [phone]

## 2021-03-07 DIAGNOSIS — R0602 Shortness of breath: Secondary | ICD-10-CM | POA: Diagnosis not present

## 2021-03-07 LAB — CULTURE, URINE COMPREHENSIVE

## 2021-03-10 LAB — MYCOPLASMA / UREAPLASMA CULTURE

## 2021-03-12 DIAGNOSIS — R112 Nausea with vomiting, unspecified: Secondary | ICD-10-CM | POA: Diagnosis not present

## 2021-03-12 DIAGNOSIS — R0602 Shortness of breath: Secondary | ICD-10-CM | POA: Diagnosis not present

## 2021-03-12 DIAGNOSIS — J449 Chronic obstructive pulmonary disease, unspecified: Secondary | ICD-10-CM | POA: Diagnosis not present

## 2021-03-12 DIAGNOSIS — N189 Chronic kidney disease, unspecified: Secondary | ICD-10-CM | POA: Diagnosis not present

## 2021-03-12 DIAGNOSIS — I081 Rheumatic disorders of both mitral and tricuspid valves: Secondary | ICD-10-CM | POA: Diagnosis not present

## 2021-03-12 DIAGNOSIS — I130 Hypertensive heart and chronic kidney disease with heart failure and stage 1 through stage 4 chronic kidney disease, or unspecified chronic kidney disease: Secondary | ICD-10-CM | POA: Diagnosis not present

## 2021-03-12 DIAGNOSIS — B9620 Unspecified Escherichia coli [E. coli] as the cause of diseases classified elsewhere: Secondary | ICD-10-CM | POA: Diagnosis not present

## 2021-03-12 DIAGNOSIS — I4811 Longstanding persistent atrial fibrillation: Secondary | ICD-10-CM | POA: Diagnosis not present

## 2021-03-12 DIAGNOSIS — I482 Chronic atrial fibrillation, unspecified: Secondary | ICD-10-CM | POA: Diagnosis not present

## 2021-03-12 DIAGNOSIS — R0789 Other chest pain: Secondary | ICD-10-CM | POA: Diagnosis not present

## 2021-03-12 DIAGNOSIS — E8770 Fluid overload, unspecified: Secondary | ICD-10-CM | POA: Diagnosis not present

## 2021-03-12 DIAGNOSIS — N390 Urinary tract infection, site not specified: Secondary | ICD-10-CM | POA: Diagnosis not present

## 2021-03-12 DIAGNOSIS — N400 Enlarged prostate without lower urinary tract symptoms: Secondary | ICD-10-CM | POA: Diagnosis not present

## 2021-03-12 DIAGNOSIS — I480 Paroxysmal atrial fibrillation: Secondary | ICD-10-CM | POA: Diagnosis not present

## 2021-03-12 DIAGNOSIS — J9611 Chronic respiratory failure with hypoxia: Secondary | ICD-10-CM | POA: Diagnosis not present

## 2021-03-12 DIAGNOSIS — I2583 Coronary atherosclerosis due to lipid rich plaque: Secondary | ICD-10-CM | POA: Diagnosis not present

## 2021-03-12 DIAGNOSIS — Z20822 Contact with and (suspected) exposure to covid-19: Secondary | ICD-10-CM | POA: Diagnosis not present

## 2021-03-12 DIAGNOSIS — J811 Chronic pulmonary edema: Secondary | ICD-10-CM | POA: Diagnosis not present

## 2021-03-12 DIAGNOSIS — I2510 Atherosclerotic heart disease of native coronary artery without angina pectoris: Secondary | ICD-10-CM | POA: Diagnosis not present

## 2021-03-12 DIAGNOSIS — I2582 Chronic total occlusion of coronary artery: Secondary | ICD-10-CM | POA: Diagnosis not present

## 2021-03-12 DIAGNOSIS — I5033 Acute on chronic diastolic (congestive) heart failure: Secondary | ICD-10-CM | POA: Diagnosis not present

## 2021-03-12 DIAGNOSIS — J8410 Pulmonary fibrosis, unspecified: Secondary | ICD-10-CM | POA: Diagnosis not present

## 2021-03-12 DIAGNOSIS — I4891 Unspecified atrial fibrillation: Secondary | ICD-10-CM | POA: Diagnosis not present

## 2021-03-12 DIAGNOSIS — Z87891 Personal history of nicotine dependence: Secondary | ICD-10-CM | POA: Diagnosis not present

## 2021-03-12 DIAGNOSIS — R0600 Dyspnea, unspecified: Secondary | ICD-10-CM | POA: Diagnosis not present

## 2021-03-12 DIAGNOSIS — J9621 Acute and chronic respiratory failure with hypoxia: Secondary | ICD-10-CM | POA: Diagnosis not present

## 2021-03-12 DIAGNOSIS — R531 Weakness: Secondary | ICD-10-CM | POA: Diagnosis not present

## 2021-03-12 DIAGNOSIS — I5032 Chronic diastolic (congestive) heart failure: Secondary | ICD-10-CM | POA: Diagnosis not present

## 2021-03-12 DIAGNOSIS — R8281 Pyuria: Secondary | ICD-10-CM | POA: Diagnosis not present

## 2021-03-12 DIAGNOSIS — I5030 Unspecified diastolic (congestive) heart failure: Secondary | ICD-10-CM | POA: Diagnosis not present

## 2021-03-12 DIAGNOSIS — N3090 Cystitis, unspecified without hematuria: Secondary | ICD-10-CM | POA: Diagnosis not present

## 2021-03-13 DIAGNOSIS — N400 Enlarged prostate without lower urinary tract symptoms: Secondary | ICD-10-CM | POA: Insufficient documentation

## 2021-03-13 DIAGNOSIS — K219 Gastro-esophageal reflux disease without esophagitis: Secondary | ICD-10-CM | POA: Insufficient documentation

## 2021-03-13 DIAGNOSIS — I5030 Unspecified diastolic (congestive) heart failure: Secondary | ICD-10-CM | POA: Insufficient documentation

## 2021-03-13 DIAGNOSIS — N189 Chronic kidney disease, unspecified: Secondary | ICD-10-CM | POA: Insufficient documentation

## 2021-03-17 DIAGNOSIS — R0600 Dyspnea, unspecified: Secondary | ICD-10-CM | POA: Diagnosis not present

## 2021-03-18 DIAGNOSIS — R0600 Dyspnea, unspecified: Secondary | ICD-10-CM | POA: Diagnosis not present

## 2021-03-19 DIAGNOSIS — N189 Chronic kidney disease, unspecified: Secondary | ICD-10-CM | POA: Diagnosis not present

## 2021-03-19 DIAGNOSIS — J449 Chronic obstructive pulmonary disease, unspecified: Secondary | ICD-10-CM | POA: Diagnosis not present

## 2021-03-19 DIAGNOSIS — E8770 Fluid overload, unspecified: Secondary | ICD-10-CM | POA: Diagnosis not present

## 2021-03-19 DIAGNOSIS — I2510 Atherosclerotic heart disease of native coronary artery without angina pectoris: Secondary | ICD-10-CM | POA: Diagnosis not present

## 2021-03-19 DIAGNOSIS — I5033 Acute on chronic diastolic (congestive) heart failure: Secondary | ICD-10-CM | POA: Diagnosis not present

## 2021-03-19 DIAGNOSIS — I4811 Longstanding persistent atrial fibrillation: Secondary | ICD-10-CM | POA: Diagnosis not present

## 2021-03-20 DIAGNOSIS — I5032 Chronic diastolic (congestive) heart failure: Secondary | ICD-10-CM | POA: Diagnosis not present

## 2021-03-20 DIAGNOSIS — Z955 Presence of coronary angioplasty implant and graft: Secondary | ICD-10-CM | POA: Insufficient documentation

## 2021-03-20 DIAGNOSIS — I480 Paroxysmal atrial fibrillation: Secondary | ICD-10-CM | POA: Diagnosis not present

## 2021-03-20 DIAGNOSIS — I2510 Atherosclerotic heart disease of native coronary artery without angina pectoris: Secondary | ICD-10-CM | POA: Diagnosis not present

## 2021-03-24 ENCOUNTER — Emergency Department: Payer: Medicare HMO

## 2021-03-24 ENCOUNTER — Emergency Department
Admission: EM | Admit: 2021-03-24 | Discharge: 2021-03-25 | Disposition: A | Discharge status: Home / Self Care | Payer: Medicare HMO | Attending: Emergency Medicine | Admitting: Emergency Medicine

## 2021-03-24 ENCOUNTER — Other Ambulatory Visit: Payer: Self-pay

## 2021-03-24 ENCOUNTER — Encounter: Payer: Self-pay | Admitting: Radiology

## 2021-03-24 DIAGNOSIS — Z955 Presence of coronary angioplasty implant and graft: Secondary | ICD-10-CM | POA: Insufficient documentation

## 2021-03-24 DIAGNOSIS — R0600 Dyspnea, unspecified: Secondary | ICD-10-CM | POA: Diagnosis not present

## 2021-03-24 DIAGNOSIS — J90 Pleural effusion, not elsewhere classified: Secondary | ICD-10-CM | POA: Diagnosis not present

## 2021-03-24 DIAGNOSIS — R918 Other nonspecific abnormal finding of lung field: Secondary | ICD-10-CM | POA: Diagnosis not present

## 2021-03-24 DIAGNOSIS — I959 Hypotension, unspecified: Secondary | ICD-10-CM | POA: Diagnosis not present

## 2021-03-24 DIAGNOSIS — J449 Chronic obstructive pulmonary disease, unspecified: Secondary | ICD-10-CM | POA: Diagnosis not present

## 2021-03-24 DIAGNOSIS — J984 Other disorders of lung: Secondary | ICD-10-CM | POA: Diagnosis not present

## 2021-03-24 DIAGNOSIS — I110 Hypertensive heart disease with heart failure: Secondary | ICD-10-CM | POA: Insufficient documentation

## 2021-03-24 DIAGNOSIS — I509 Heart failure, unspecified: Secondary | ICD-10-CM | POA: Diagnosis not present

## 2021-03-24 DIAGNOSIS — R0789 Other chest pain: Secondary | ICD-10-CM | POA: Diagnosis not present

## 2021-03-24 DIAGNOSIS — I3139 Other pericardial effusion (noninflammatory): Secondary | ICD-10-CM | POA: Diagnosis not present

## 2021-03-24 DIAGNOSIS — I2510 Atherosclerotic heart disease of native coronary artery without angina pectoris: Secondary | ICD-10-CM | POA: Insufficient documentation

## 2021-03-24 DIAGNOSIS — I4891 Unspecified atrial fibrillation: Secondary | ICD-10-CM | POA: Diagnosis not present

## 2021-03-24 DIAGNOSIS — J189 Pneumonia, unspecified organism: Secondary | ICD-10-CM | POA: Insufficient documentation

## 2021-03-24 DIAGNOSIS — J439 Emphysema, unspecified: Secondary | ICD-10-CM | POA: Diagnosis not present

## 2021-03-24 DIAGNOSIS — R0689 Other abnormalities of breathing: Secondary | ICD-10-CM | POA: Diagnosis not present

## 2021-03-24 DIAGNOSIS — R0602 Shortness of breath: Secondary | ICD-10-CM | POA: Diagnosis not present

## 2021-03-24 LAB — BASIC METABOLIC PANEL
Anion gap: 9 (ref 5–15)
BUN: 21 mg/dL (ref 8–23)
CO2: 34 mmol/L — ABNORMAL HIGH (ref 22–32)
Calcium: 9.1 mg/dL (ref 8.9–10.3)
Chloride: 94 mmol/L — ABNORMAL LOW (ref 98–111)
Creatinine, Ser: 1.36 mg/dL — ABNORMAL HIGH (ref 0.61–1.24)
GFR, Estimated: 53 mL/min — ABNORMAL LOW (ref >60)
Glucose, Bld: 112 mg/dL — ABNORMAL HIGH (ref 70–99)
Potassium: 4.1 mmol/L (ref 3.5–5.1)
Sodium: 137 mmol/L (ref 135–145)

## 2021-03-24 LAB — CBC
HCT: 30.4 % — ABNORMAL LOW (ref 39.0–52.0)
Hemoglobin: 9.3 g/dL — ABNORMAL LOW (ref 13.0–17.0)
MCH: 28.2 pg (ref 26.0–34.0)
MCHC: 30.6 g/dL (ref 30.0–36.0)
MCV: 92.1 fL (ref 80.0–100.0)
Platelets: 208 10*3/uL (ref 150–400)
RBC: 3.3 MIL/uL — ABNORMAL LOW (ref 4.22–5.81)
RDW: 18.5 % — ABNORMAL HIGH (ref 11.5–15.5)
WBC: 8.6 10*3/uL (ref 4.0–10.5)
nRBC: 0 % (ref 0.0–0.2)

## 2021-03-24 LAB — PROTIME-INR
INR: 1.1 (ref 0.8–1.2)
Prothrombin Time: 14.6 seconds (ref 11.4–15.2)

## 2021-03-24 LAB — TROPONIN I (HIGH SENSITIVITY)
Troponin I (High Sensitivity): 104 ng/L (ref <18)
Troponin I (High Sensitivity): 104 ng/L (ref <18)

## 2021-03-24 MED ORDER — AZITHROMYCIN 500 MG PO TABS
500.0000 mg | ORAL_TABLET | Freq: Once | ORAL | Status: AC
Start: 2021-03-24 — End: 2021-03-24
  Administered 2021-03-24: 500 mg via ORAL
  Filled 2021-03-24: qty 1

## 2021-03-24 MED ORDER — ONDANSETRON HCL 4 MG/2ML IJ SOLN
4.0000 mg | Freq: Once | INTRAMUSCULAR | Status: DC
  Filled 2021-03-24: qty 2

## 2021-03-24 MED ORDER — CEPHALEXIN 500 MG PO CAPS: 500.0000 mg | ORAL_CAPSULE | Freq: Two times a day (BID) | ORAL | 0 refills | Status: AC

## 2021-03-24 MED ORDER — FENTANYL CITRATE PF 50 MCG/ML IJ SOSY
50.0000 ug | PREFILLED_SYRINGE | Freq: Once | INTRAMUSCULAR | Status: DC
  Filled 2021-03-24: qty 1

## 2021-03-24 MED ORDER — AZITHROMYCIN 250 MG PO TABS: 250.0000 mg | ORAL_TABLET | Freq: Every day | ORAL | 0 refills | Status: DC

## 2021-03-24 NOTE — Discharge Instructions (Addendum)
Please follow-up with your doctor over the next 1 to 2 days for recheck/reevaluation.  Please complete your course of antibiotics as prescribed.  Return to the emergency department for any return of/worsening chest pain, any trouble breathing, or any other symptom personally concerning to yourself. ?

## 2021-03-24 NOTE — ED Triage Notes (Signed)
Pt with chest pressure that started today. Pt recently had 3 stents placed on Thursday. Pt on home O2 3L since February when he had a MI and pneumonia. PT pursed lipped breathing on arrival. Transition o2 from ems to our oxygen dropped to 76%. ?

## 2021-03-24 NOTE — ED Provider Notes (Signed)
? ?[facility] ?Provider Note ? ? ? Event Date/Time  ? First MD Initiated Contact with Patient 03/24/21 1900   ?  (approximate) ? ?History  ? ?Chief Complaint: Chest Pain ? ?HPI ? ?[name] Browning is a 81 y.o. male with a past medical history of angina, CAD status post multiple stents including stent placement this past Friday per patient, CHF, COPD on 3 L 24/7, hypertension/hyperlipidemia, presents to the emergency department for chest discomfort and shortness of breath.  According to the patient since yesterday he has been experiencing chest tightness and intermittent sharp pains to his chest along with shortness of breath.  Patient states he has been nauseated at times as well but denies any nausea currently.  States his shortness of breath is chronic he wears 3 L of oxygen sometimes 4 L at home.  Currently satting upper 90s on 3 L. ? ?Physical Exam  ? ?Triage Vital Signs: ?ED Triage Vitals  ?Enc Vitals Group  ?   BP 03/24/21 1825 129/78  ?   Pulse Rate 03/24/21 1825 71  ?   Resp 03/24/21 1825 20  ?   Temp 03/24/21 1825 98 ?F (36.7 ?C)  ?   Temp Source 03/24/21 1825 Oral  ?   SpO2 03/24/21 1821 97 %  ?   Weight 03/24/21 1826 185 lb (83.9 kg)  ?   Height 03/24/21 1826 '6\' 2"'$  (1.88 m)  ?   Head Circumference --   ?   Peak Flow --   ?   Pain Score --   ?   Pain Loc --   ?   Pain Edu? --   ?   Excl. in [place]? --   ? ? ?Most recent vital signs: ?Vitals:  ? 03/24/21 1821 03/24/21 1825  ?BP:  129/78  ?Pulse:  71  ?Resp:  20  ?Temp:  98 ?F (36.7 ?C)  ?SpO2: 97% 96%  ? ? ?General: Awake, no distress.  ?CV:  Good peripheral perfusion.  Regular rate and rhythm  ?Resp:  Normal effort.  Equal breath sounds bilaterally.  ?Abd:  No distention.  Soft, nontender.  No rebound or guarding. ?Other:  No lower extremity edema ? ? ?ED Results / Procedures / Treatments  ? ?EKG ? ?EKG viewed and interpreted by myself shows atrial fibrillation at 76 bpm with a narrow QRS, left axis deviation, largely normal intervals with  nonspecific ST changes. ? ?RADIOLOGY ? ?I have reviewed the chest x-ray images appears to have some haziness bilaterally. ?Radiology is read the chest x-ray has loculated bilateral pleural effusions.  Possible interstitial edema. ?CT scan read as new lower lobe nodularity concerning for pneumonia.  Bilateral pleural effusions similar to prior study. ? ? ?MEDICATIONS ORDERED IN ED: ?Medications - No data to display ? ? ?IMPRESSION / MDM / ASSESSMENT AND PLAN / ED COURSE  ?I reviewed the triage vital signs and the nursing notes. ? ?Patient presents to the emergency department for chest tightness and shortness of breath.  I reviewed the patient's recent admission to [facility].  Patient was admitted 03/13/2021 and discharged 03/20/2021 after an admission for dyspnea, CHF exacerbation, atrial fibrillation.  During the admission patient had what appears to be cardiac catheterization I believe on 10 March with PCI placement to the RCA, CTA and LAD.  Given the patient's significant cardiac history we will check labs including cardiac enzymes.  We will obtain a chest x-ray and continue to closely monitor.  Patient describes her chest pain  as sudden and sharp lasting several seconds and then going away but continues to have chest tightness and shortness of breath in between episodes. ? ?Patient's work-up is overall reassuring.  Given the chest x-ray findings I did proceed with CT imaging of the chest.  The pleural effusions appear stable from prior studies but the patient does have new nodular densities in lower lobes concerning for possible pneumonia.  Given the patient's chest tightness we will cover with antibiotics.  Reassuringly patient's troponin although elevated is unchanged after several hours.  Troponin would be expected to be elevated following his recent heart catheterization 5 days ago.  Patient denies any chest pain currently.  He states he feels like he is ready to go home.  Given the patient's history and  recent cardiac stents I did consider admission to the hospital for this patient however given his reassuring work-up physical exam labs and CT imaging are believe the patient will be safe for discharge home on a course of antibiotics and PCP follow-up.  Patient agreeable to plan of care.  Discussed return precautions.  Also discussed the patient's care with the patient's granddaughter over the phone. ? ?FINAL CLINICAL IMPRESSION(S) / ED DIAGNOSES  ? ?Chest pain ?Dyspnea ?Community-acquired pneumonia ? ? ? ?Note:  This document was prepared using Dragon voice recognition software and may include unintentional dictation errors. ?  Harvest Dark, MD ?03/24/21 2331 ? ?

## 2021-03-26 ENCOUNTER — Ambulatory Visit: Payer: Medicare HMO | Admitting: Family

## 2021-03-26 DIAGNOSIS — R079 Chest pain, unspecified: Secondary | ICD-10-CM | POA: Diagnosis not present

## 2021-03-26 DIAGNOSIS — J441 Chronic obstructive pulmonary disease with (acute) exacerbation: Secondary | ICD-10-CM | POA: Diagnosis not present

## 2021-03-26 DIAGNOSIS — R778 Other specified abnormalities of plasma proteins: Secondary | ICD-10-CM | POA: Diagnosis not present

## 2021-03-26 DIAGNOSIS — R0789 Other chest pain: Secondary | ICD-10-CM | POA: Diagnosis not present

## 2021-03-26 DIAGNOSIS — N401 Enlarged prostate with lower urinary tract symptoms: Secondary | ICD-10-CM | POA: Diagnosis not present

## 2021-03-26 DIAGNOSIS — R0602 Shortness of breath: Secondary | ICD-10-CM | POA: Diagnosis not present

## 2021-03-26 DIAGNOSIS — R300 Dysuria: Secondary | ICD-10-CM | POA: Diagnosis not present

## 2021-03-26 DIAGNOSIS — I2510 Atherosclerotic heart disease of native coronary artery without angina pectoris: Secondary | ICD-10-CM | POA: Diagnosis not present

## 2021-03-26 DIAGNOSIS — I4891 Unspecified atrial fibrillation: Secondary | ICD-10-CM | POA: Diagnosis not present

## 2021-03-26 DIAGNOSIS — I130 Hypertensive heart and chronic kidney disease with heart failure and stage 1 through stage 4 chronic kidney disease, or unspecified chronic kidney disease: Secondary | ICD-10-CM | POA: Diagnosis not present

## 2021-03-26 DIAGNOSIS — I4821 Permanent atrial fibrillation: Secondary | ICD-10-CM | POA: Diagnosis not present

## 2021-03-26 DIAGNOSIS — I2582 Chronic total occlusion of coronary artery: Secondary | ICD-10-CM | POA: Diagnosis not present

## 2021-03-26 DIAGNOSIS — R072 Precordial pain: Secondary | ICD-10-CM | POA: Diagnosis not present

## 2021-03-26 DIAGNOSIS — J9620 Acute and chronic respiratory failure, unspecified whether with hypoxia or hypercapnia: Secondary | ICD-10-CM | POA: Diagnosis not present

## 2021-03-26 DIAGNOSIS — R791 Abnormal coagulation profile: Secondary | ICD-10-CM | POA: Diagnosis not present

## 2021-03-26 DIAGNOSIS — Z20822 Contact with and (suspected) exposure to covid-19: Secondary | ICD-10-CM | POA: Diagnosis not present

## 2021-03-26 DIAGNOSIS — N189 Chronic kidney disease, unspecified: Secondary | ICD-10-CM | POA: Diagnosis not present

## 2021-03-26 DIAGNOSIS — J9621 Acute and chronic respiratory failure with hypoxia: Secondary | ICD-10-CM | POA: Diagnosis not present

## 2021-03-26 DIAGNOSIS — I5032 Chronic diastolic (congestive) heart failure: Secondary | ICD-10-CM | POA: Diagnosis not present

## 2021-03-26 DIAGNOSIS — N289 Disorder of kidney and ureter, unspecified: Secondary | ICD-10-CM | POA: Diagnosis not present

## 2021-03-26 DIAGNOSIS — N3001 Acute cystitis with hematuria: Secondary | ICD-10-CM | POA: Diagnosis not present

## 2021-03-26 DIAGNOSIS — D631 Anemia in chronic kidney disease: Secondary | ICD-10-CM | POA: Diagnosis not present

## 2021-03-26 DIAGNOSIS — N390 Urinary tract infection, site not specified: Secondary | ICD-10-CM | POA: Diagnosis not present

## 2021-03-26 DIAGNOSIS — J439 Emphysema, unspecified: Secondary | ICD-10-CM | POA: Diagnosis not present

## 2021-04-04 DIAGNOSIS — J449 Chronic obstructive pulmonary disease, unspecified: Secondary | ICD-10-CM | POA: Diagnosis not present

## 2021-04-06 ENCOUNTER — Encounter: Payer: Self-pay | Admitting: Family

## 2021-04-06 ENCOUNTER — Ambulatory Visit: Payer: Medicare HMO | Attending: Family | Admitting: Family

## 2021-04-06 ENCOUNTER — Other Ambulatory Visit: Payer: Self-pay

## 2021-04-06 VITALS — BP 118/78 | HR 76 | Resp 16 | Ht 74.0 in | Wt 183.0 lb

## 2021-04-06 DIAGNOSIS — Z86718 Personal history of other venous thrombosis and embolism: Secondary | ICD-10-CM | POA: Diagnosis not present

## 2021-04-06 DIAGNOSIS — I4811 Longstanding persistent atrial fibrillation: Secondary | ICD-10-CM | POA: Diagnosis not present

## 2021-04-06 DIAGNOSIS — I130 Hypertensive heart and chronic kidney disease with heart failure and stage 1 through stage 4 chronic kidney disease, or unspecified chronic kidney disease: Secondary | ICD-10-CM | POA: Diagnosis not present

## 2021-04-06 DIAGNOSIS — J449 Chronic obstructive pulmonary disease, unspecified: Secondary | ICD-10-CM | POA: Insufficient documentation

## 2021-04-06 DIAGNOSIS — Z79899 Other long term (current) drug therapy: Secondary | ICD-10-CM | POA: Insufficient documentation

## 2021-04-06 DIAGNOSIS — I4891 Unspecified atrial fibrillation: Secondary | ICD-10-CM | POA: Diagnosis not present

## 2021-04-06 DIAGNOSIS — R059 Cough, unspecified: Secondary | ICD-10-CM | POA: Diagnosis not present

## 2021-04-06 DIAGNOSIS — I779 Disorder of arteries and arterioles, unspecified: Secondary | ICD-10-CM | POA: Insufficient documentation

## 2021-04-06 DIAGNOSIS — E785 Hyperlipidemia, unspecified: Secondary | ICD-10-CM | POA: Insufficient documentation

## 2021-04-06 DIAGNOSIS — F419 Anxiety disorder, unspecified: Secondary | ICD-10-CM | POA: Diagnosis not present

## 2021-04-06 DIAGNOSIS — I5032 Chronic diastolic (congestive) heart failure: Secondary | ICD-10-CM | POA: Insufficient documentation

## 2021-04-06 DIAGNOSIS — K219 Gastro-esophageal reflux disease without esophagitis: Secondary | ICD-10-CM | POA: Diagnosis not present

## 2021-04-06 DIAGNOSIS — I10 Essential (primary) hypertension: Secondary | ICD-10-CM

## 2021-04-06 DIAGNOSIS — Z8673 Personal history of transient ischemic attack (TIA), and cerebral infarction without residual deficits: Secondary | ICD-10-CM | POA: Diagnosis not present

## 2021-04-06 DIAGNOSIS — Z9981 Dependence on supplemental oxygen: Secondary | ICD-10-CM | POA: Diagnosis not present

## 2021-04-06 DIAGNOSIS — I2510 Atherosclerotic heart disease of native coronary artery without angina pectoris: Secondary | ICD-10-CM | POA: Insufficient documentation

## 2021-04-06 DIAGNOSIS — N189 Chronic kidney disease, unspecified: Secondary | ICD-10-CM | POA: Diagnosis not present

## 2021-04-06 NOTE — Progress Notes (Signed)
Patient ID: [name] Browning, male    DOB: [birth year], 81 y.o.   MRN: [id number]  HPI  Mr Cardin is a 81 y/o male with a history of carotid disease, CAD, hyperlipidemia, HTN, CKD, stroke, anxiety, atrial fibrillation, COPD, DVT, GERD, previous tobacco use and chronic heart failure.   Echo report from 03/17/2021 reviewed and showed an EF of 60-65%. Echo report from 11/15/20 reviewed and showed an EF of 60-65% along with severe LAE and mild MR.   LHC done 11/14/20 showed: Ost RCA lesion is 95% stenosed.   Prox RCA to Dist RCA lesion is 100% stenosed. -Very minimal flow.  After initial angiography, it disappeared to progressed to the ostium.   Left-to-right collaterals via septal perforators fill the PDA system.   Mid LM to Dist LM lesion is 20% stenosed.   Ost LAD to Prox LAD lesion is 25% stenosed.  Relatively small caliber vessel that wraps the apex.   Diminutive LCx with moderate sized OM1, mild disease.   The left ventricular systolic function is normal. No obvious regional wall motion normalities.   The left ventricular ejection fraction is 55-65% by visual estimate.   LV end diastolic pressure is moderately elevated.  18 mmHg   There is no aortic valve stenosis.  Admitted 3/17-3/20/23 for COPD exacerbation and PNA, eventually discharged home. In the ED overnight 3/15-3/16/23 for CAP, given abx and discharged home. Admitted 3/4-3/11/23 with acute on chronic respiratory failure, COPD and HF exacerbation, discharged home. Was in the ED 01/09/21 due to left-sided epistaxis. While being triaged, nosebleed stopped with pressure and he was released. Recent admission at [facility]. Admitted 11/11/20 due to shortness of breath due to acute on chronic HF. Initially treated with IV lasix with transition to oral diuretics. Elevated troponin. Eliquis held due to GIB. GI and cardiology consults obtained. Started on IV protonix. Developed cardiogenic shock and treated with levophed. Renal function worsened due to  dehydration and contrast dye but improved with IV hydration. Antibiotics started due to UTI. Discharged after 21 days. Admitted twice in October.   He presents today for a follow-up visit with a chief complaint of moderate shortness of breath with minimal exertion. He describes this as chronic in nature having been present for several years. Somewhat improved since his hospital discharge a week ago. He has associated fatigue, cough, intermittent chest pain, palpitations, dizziness, weakness, easy bruising, and chronic difficulty sleeping. He denies any abdominal distention, pedal edema or wheezing.   He is weighing daily and his appetite has increased.   Past Medical History:  Diagnosis Date   Anginal pain ([place])    Anxiety    Aortic atherosclerosis ([place])    Atrial fibrillation ([place]) 01/2019   Bilateral carpal tunnel syndrome    CAD (coronary artery disease)    a.) LHC 2004 --> normal coronaries. b.) normal stress test in 2007 and 2011; c.) Lexiscan 05/20/2014 --> LVEF 55-65%; no significant stress induced ischemia/arrythmia. d.) CT chest 03/25/2019 --> coronaries carcified.   Carotid atherosclerosis, bilateral    Carpal tunnel syndrome, bilateral    CHF (congestive heart failure) (HCC)    Chronic anticoagulation    a.) ASA + apixaban   COPD (chronic obstructive pulmonary disease) (HCC)    CVA (cerebral vascular accident) ([place])    Degenerative disc disease, cervical    Diastolic dysfunction    a.) TTE 05/29/2014 --> LVEF 60-65%; G1DD.   DVT (deep venous thrombosis) (HCC)    GERD (gastroesophageal reflux disease)  History of 2019 novel coronavirus disease (COVID-19) 02/08/2019   HLD (hyperlipidemia)    Hypertension    Kidney stones    Osteoarthritis of right shoulder    Pneumonia    Respiratory failure, acute ([place]) 09/20/2020   a.) severe respiratory distress 1 hour after urological surgery. CXR (+) for acute pulmonary edema. Transferred to ICU and placed on NIPPV. Questionable  aspiration PNA. (+) A.fib with RVR. Improved with ABX, diuresis, and amiodarone.   Skin cancer of face    a.) RIGHT ear and RIGHT forehead; excised.   Syncope    TIA (transient ischemic attack) 2016   Valvular regurgitation    a.) TTE 05/26/2014 --> LVEF 60-65%; trivial MR, mild TR; no AR or PR. b.) TTE 04/20/2015 --> LVEF 55-60%; trivial MR and PR; no AR or TR.   Past Surgical History:  Procedure Laterality Date   CARDIAC CATHETERIZATION  2004   CARPAL TUNNEL RELEASE Right 06/11/2013   CENTRAL LINE INSERTION  11/14/2020   Procedure: CENTRAL LINE INSERTION;  Surgeon: Leonie Man, MD;  Location: [place] [facility];  Service: Cardiovascular;;   CORONARY/GRAFT ACUTE MI REVASCULARIZATION N/A 11/14/2020   Procedure: Coronary/Graft Acute MI Revascularization;  Surgeon: Leonie Man, MD;  Location: [place] [facility];  Service: Cardiovascular;  Laterality: N/A;   CYSTOSCOPY W/ RETROGRADES  10/16/2020   Procedure: CYSTOSCOPY WITH RETROGRADE PYELOGRAM;  Surgeon: Billey Co, MD;  Location: [facility];  Service: Urology;;   CYSTOSCOPY W/ URETERAL STENT PLACEMENT Left 09/20/2020   Procedure: CYSTOSCOPY WITH RETROGRADE PYELOGRAM/URETERAL STENT PLACEMENT;  Surgeon: Janith Lima, MD;  Location: [facility];  Service: Urology;  Laterality: Left;   CYSTOSCOPY/URETEROSCOPY/HOLMIUM LASER/STENT PLACEMENT Left 10/16/2020   Procedure: CYSTOSCOPY/URETEROSCOPY/HOLMIUM LASER/STENT PLACEMENT;  Surgeon: Billey Co, MD;  Location: [facility];  Service: Urology;  Laterality: Left;   ESOPHAGOGASTRODUODENOSCOPY N/A 11/06/2020   Procedure: ESOPHAGOGASTRODUODENOSCOPY (EGD);  Surgeon: Lucilla Lame, MD;  Location: [facility];  Service: Endoscopy;  Laterality: N/A;   LEFT HEART CATH AND CORONARY ANGIOGRAPHY N/A 11/14/2020   Procedure: LEFT HEART CATH AND CORONARY ANGIOGRAPHY;  Surgeon: Leonie Man, MD;  Location: [place] [facility];  Service: Cardiovascular;  Laterality: N/A;   SHOULDER ARTHROSCOPY  WITH OPEN ROTATOR CUFF REPAIR Right 08/24/2017   Procedure: SHOULDER ARTHROSCOPY WITH OPEN ROTATOR CUFF REPAIR;  Surgeon: Corky Mull, MD;  Location: [facility];  Service: Orthopedics;  Laterality: Right;   SKIN CANCER EXCISION  12/01/2016   right ear    SKIN CANCER EXCISION     remove from the right side of the face    THROMBECTOMY Right 2004   leg   THYROIDECTOMY  1950   Not sure if total or partial thyroidectomy.   Family History  Problem Relation Age of Onset   Hypertension Mother    Heart disease Mother    CAD Father    Heart attack Father    Social History   Tobacco Use   Smoking status: Former    Packs/day: 1.00    Years: 46.00    Pack years: 46.00    Types: Cigarettes    Start date: 01/10/1957    Quit date: 02/11/2003    Years since quitting: 18.1   Smokeless tobacco: Former    Types: Snuff    Quit date: 02/11/2003  Substance Use Topics   Alcohol use: Not Currently    Alcohol/week: 7.0 standard drinks    Types: 7 Cans of beer per week   Allergies  Allergen Reactions  Hydromorphone     Hallucination Pt reports he doesn't know about this   Prior to Admission medications   Medication Sig Start Date End Date Taking? Authorizing Provider  acetaminophen (TYLENOL) 500 MG tablet Take 2 tablets (1,000 mg total) by mouth every 6 (six) hours as needed for mild pain. 05/07/20  Yes Piscoya, Jacqulyn Bath, MD  albuterol (VENTOLIN HFA) 108 (90 Base) MCG/ACT inhaler Inhale into the lungs every 6 (six) hours as needed for wheezing or shortness of breath.   Yes [provider]  apixaban (ELIQUIS) 5 MG TABS tablet Take 1 tablet (5 mg total) by mouth 2 (two) times daily. 09/28/20  Yes Pokhrel, Laxman, MD  aspirin EC 81 MG tablet Take 81 mg by mouth daily after supper.   Yes [provider]  atorvastatin (LIPITOR) 40 MG tablet Take 40 mg by mouth daily after supper.   Yes [provider]  Budeson-Glycopyrrol-Formoterol (BREZTRI AEROSPHERE) 160-9-4.8 MCG/ACT AERO Inhale  2 puffs into the lungs in the morning and at bedtime. 02/17/21  Yes Kasa, Maretta Bees, MD  Budeson-Glycopyrrol-Formoterol (BREZTRI AEROSPHERE) 160-9-4.8 MCG/ACT AERO Inhale 2 puffs into the lungs in the morning and at bedtime. 02/17/21  Yes Flora Lipps, MD  cetirizine (ZYRTEC) 10 MG tablet Take 10 mg by mouth daily. 09/29/20  Yes [provider]  docusate sodium (COLACE) 100 MG capsule Take 1 capsule (100 mg total) by mouth 2 (two) times daily. 12/02/20 12/02/21 Yes Lavina Hamman, MD  famotidine (PEPCID) 20 MG tablet One after supper 10/06/20  Yes Tanda Rockers, MD  Ferrous Gluconate (IRON) 240 (27 Fe) MG TABS Take 27 mg by mouth daily after supper. 09/21/18  Yes [provider]  furosemide (LASIX) 40 MG tablet Take 40 mg by mouth daily.   Yes [provider]  isosorbide mononitrate (IMDUR) 30 MG 24 hr tablet Take 0.5 tablets (15 mg total) by mouth daily. 12/03/20  Yes Lavina Hamman, MD  lactulose (CHRONULAC) 10 GM/15ML solution Take 30 mLs (20 g total) by mouth 2 (two) times daily as needed for moderate constipation. 12/02/20  Yes Lavina Hamman, MD  metolazone (ZAROXOLYN) 2.5 MG tablet Take 1 tablet (2.5 mg total) by mouth once a week. Take it 1/2 hour before morning furosemide Take 1 extra potassium on the day you take this 12/28/20 03/28/21 Yes Darylene Price A, FNP  metoprolol tartrate (LOPRESSOR) 25 MG tablet Take 25 mg by mouth 2 (two) times daily.   Yes [provider]  montelukast (SINGULAIR) 10 MG tablet Take 10 mg by mouth at bedtime.   Yes [provider]  pantoprazole (PROTONIX) 40 MG tablet Take 1 tablet (40 mg total) by mouth 2 (two) times daily. 12/02/20  Yes Lavina Hamman, MD  phenazopyridine (PYRIDIUM) 200 MG tablet Take 1 tablet (200 mg total) by mouth 3 (three) times daily as needed (bladder pain). 09/28/20  Yes Pokhrel, Laxman, MD  polyethylene glycol (MIRALAX / GLYCOLAX) 17 g packet Take 17 g by mouth daily as needed for mild constipation or  moderate constipation. 09/28/20  Yes Pokhrel, Laxman, MD  potassium chloride SA (KLOR-CON M) 20 MEQ tablet Take 1 tablet (20 mEq total) by mouth daily. 12/18/20  Yes Alisa Graff, FNP  SYMBICORT 160-4.5 MCG/ACT inhaler Inhale 2 puffs into the lungs 2 (two) times daily as needed (shortness of breath). 12/07/18  Yes [provider]  tamsulosin (FLOMAX) 0.4 MG CAPS capsule TAKE 1 CAPSULE BY MOUTH EVERY DAY 11/09/20  Yes Billey Co, MD  umeclidinium-vilanterol (ANORO ELLIPTA) 62.5-25 MCG/ACT AEPB Inhale 1 puff into the lungs daily.   Yes [provider]  amoxicillin (AMOXIL) 250 MG/5 ML SUSP Take 10 mg/kg by mouth every 8 (eight) hours. Patient not taking: Reported on 03/01/2021    [provider]    Review of Systems  Constitutional:  Positive for fatigue (easily).  HENT:  Negative for congestion, rhinorrhea and sore throat.   Eyes: Negative.   Respiratory:  Positive for cough (productive) and shortness of breath. Negative for chest tightness and wheezing.   Cardiovascular:  Positive for chest pain and palpitations (at times). Negative for leg swelling.  Gastrointestinal:  Negative for abdominal distention, abdominal pain and vomiting.  Endocrine: Negative.   Genitourinary: Negative.   Musculoskeletal:  Negative for back pain and neck pain.  Skin: Negative.   Allergic/Immunologic: Negative.   Neurological:  Positive for dizziness (when standing) and weakness. Negative for light-headedness.  Hematological:  Negative for adenopathy. Bruises/bleeds easily.  Psychiatric/Behavioral:  Positive for sleep disturbance (sleeping in recliner). Negative for dysphoric mood. The patient is not nervous/anxious.    Vitals:   04/06/21 1254  BP: 118/78  Pulse: 76  Resp: 16  SpO2: 92%  Weight: 183 lb (83 kg)  Height: '6\' 2"'$  (1.88 m)   Wt Readings from Last 3 Encounters:  04/06/21 183 lb (83 kg)  03/24/21 185 lb (83.9 kg)  03/01/21 185 lb 3 oz (84 kg)   Lab Results   Component Value Date   CREATININE 1.36 (H) 03/24/2021   CREATININE 1.37 (H) 03/01/2021   CREATININE 1.23 01/27/2021   Physical Exam Vitals and nursing note reviewed. Exam conducted with a chaperone present (granddaughter).  Constitutional:      General: He is not in acute distress.    Appearance: Normal appearance. He is ill-appearing. He is not toxic-appearing.  HENT:     Head: Normocephalic and atraumatic.  Cardiovascular:     Rate and Rhythm: Normal rate. Rhythm irregular.  Pulmonary:     Breath sounds: Rhonchi present. No wheezing or rales.  Abdominal:     General: There is no distension.     Palpations: Abdomen is soft.  Musculoskeletal:     Cervical back: Normal range of motion and neck supple.     Right lower leg: Tenderness present. No edema.     Left lower leg: No edema.  Skin:    General: Skin is warm and dry.  Neurological:     General: No focal deficit present.     Mental Status: He is alert and oriented to person, place, and time.  Psychiatric:        Mood and Affect: Mood is not anxious.        Behavior: Behavior normal.   Assessment & Plan:  1: Chronic heart failure with preserved ejection fraction with structural changes (LAE)- - NYHA class III - euvolemic today - weighing daily; reminded to call for an overnight weight gain of > 2 pounds or a weekly weight gain of > 5 pounds - weight 183, down 18 lbs since last visit - not adding salt to his food and trying to read food labels for sodium content; reports decrease appetite due to decreased taste - consider adding entresto/ SGLT2 at future visits if BP allows - will see cardiology Clydene Fake) 04/20/21 - BNP 12/28/20 was 511.4  - discharged without [facility] services, although he may have been offered and declined - encouraged him to call PCP for f/u as [facility] services are likely waranted -  he inquired about a hospital bed, deferred to PCP   2: HTN- - BP 118/78 - taking metolazone weekly with extra potassium tablet -  sees PCP (Riggins) but has to schedule a f/u - BMP 03/29/21 reviewed and showed sodium 140, potassium 4.4, creatinine 0.93 and GFR 60  3: COPD- - saw pulmonology (Kasa) 02/17/21; returns 06/08/21 - wearing oxygen at 4L around the clock  4: Atrial fibrillation- - saw cardiology Stark Klein) 02/2021 - irregular rhythm noted   Medication list reviewed.   Return in 3 months, sooner if needed.

## 2021-04-06 NOTE — Patient Instructions (Signed)
Call your Primary MD today and let them know you are out of the hospital and need to be seen. ? ?Let them also know that you need a hospital bed. ? ?Continue to watch your salt and fluid intake. ? ?Return in 3 months.  ? ?Glad you are feeling a little better! ?

## 2021-04-07 ENCOUNTER — Ambulatory Visit: Payer: Medicare HMO | Admitting: Family

## 2021-04-09 DIAGNOSIS — E782 Mixed hyperlipidemia: Secondary | ICD-10-CM | POA: Diagnosis not present

## 2021-04-09 DIAGNOSIS — R0602 Shortness of breath: Secondary | ICD-10-CM | POA: Diagnosis not present

## 2021-04-09 DIAGNOSIS — J449 Chronic obstructive pulmonary disease, unspecified: Secondary | ICD-10-CM | POA: Diagnosis not present

## 2021-04-09 DIAGNOSIS — I4891 Unspecified atrial fibrillation: Secondary | ICD-10-CM | POA: Diagnosis not present

## 2021-04-09 DIAGNOSIS — I10 Essential (primary) hypertension: Secondary | ICD-10-CM | POA: Diagnosis not present

## 2021-04-09 DIAGNOSIS — Z9861 Coronary angioplasty status: Secondary | ICD-10-CM | POA: Diagnosis not present

## 2021-04-09 DIAGNOSIS — K219 Gastro-esophageal reflux disease without esophagitis: Secondary | ICD-10-CM | POA: Diagnosis not present

## 2021-04-13 DIAGNOSIS — B9689 Other specified bacterial agents as the cause of diseases classified elsewhere: Secondary | ICD-10-CM | POA: Diagnosis not present

## 2021-04-13 DIAGNOSIS — Z9981 Dependence on supplemental oxygen: Secondary | ICD-10-CM | POA: Diagnosis not present

## 2021-04-13 DIAGNOSIS — Z6824 Body mass index (BMI) 24.0-24.9, adult: Secondary | ICD-10-CM | POA: Diagnosis not present

## 2021-04-13 DIAGNOSIS — E782 Mixed hyperlipidemia: Secondary | ICD-10-CM | POA: Diagnosis not present

## 2021-04-13 DIAGNOSIS — I10 Essential (primary) hypertension: Secondary | ICD-10-CM | POA: Diagnosis not present

## 2021-04-13 DIAGNOSIS — N390 Urinary tract infection, site not specified: Secondary | ICD-10-CM | POA: Diagnosis not present

## 2021-04-13 DIAGNOSIS — J329 Chronic sinusitis, unspecified: Secondary | ICD-10-CM | POA: Diagnosis not present

## 2021-04-13 DIAGNOSIS — I5032 Chronic diastolic (congestive) heart failure: Secondary | ICD-10-CM | POA: Diagnosis not present

## 2021-04-13 DIAGNOSIS — R0902 Hypoxemia: Secondary | ICD-10-CM | POA: Diagnosis not present

## 2021-04-13 DIAGNOSIS — J449 Chronic obstructive pulmonary disease, unspecified: Secondary | ICD-10-CM | POA: Diagnosis not present

## 2021-04-14 DIAGNOSIS — J479 Bronchiectasis, uncomplicated: Secondary | ICD-10-CM | POA: Diagnosis not present

## 2021-04-15 ENCOUNTER — Other Ambulatory Visit: Payer: Self-pay | Admitting: Urology

## 2021-04-15 ENCOUNTER — Encounter: Payer: Self-pay | Admitting: Urology

## 2021-04-15 ENCOUNTER — Ambulatory Visit (INDEPENDENT_AMBULATORY_CARE_PROVIDER_SITE_OTHER): Payer: Medicare HMO | Admitting: Urology

## 2021-04-15 VITALS — BP 117/66 | HR 81 | Ht 74.0 in | Wt 183.0 lb

## 2021-04-15 DIAGNOSIS — N200 Calculus of kidney: Secondary | ICD-10-CM

## 2021-04-15 DIAGNOSIS — N390 Urinary tract infection, site not specified: Secondary | ICD-10-CM

## 2021-04-15 LAB — BLADDER SCAN AMB NON-IMAGING

## 2021-04-15 MED ORDER — FOSFOMYCIN TROMETHAMINE 3 G PO PACK: 3.0000 g | PACK | ORAL | 0 refills | Status: AC

## 2021-04-15 NOTE — Telephone Encounter (Signed)
Fosfomycin 15g will cost pt $400, please advise on alternative. ?

## 2021-04-15 NOTE — Telephone Encounter (Signed)
[place] coupon faxed to pharmacy on behalf of patient. Called [facility] on provider line making them aware.  ?

## 2021-04-15 NOTE — Progress Notes (Signed)
? ?  04/15/2021 ?11:46 AM  ? ?[name] Browning ?[birth year] ?[id number] ? ?Reason for visit: UTI symptoms, history of nephrolithiasis ? ?HPI: ?Comorbid 81 year old male with history of an infected stone and stent placement who underwent definitive ureteroscopy in October 2022, and stent was left on a Dangler and subsequently removed.  He was admitted for pneumonia/UTI in November 2022, and CT showed no hydronephrosis or ureteral stones.  I personally viewed and interpreted those images.  Urine culture ultimately grew resistant E. coli. ? ?At our last visit on 03/04/2021 he was having about a month of urinary urgency, frequency, and dysuria, urinalysis appeared infected and ultimately grew resistant E. coli, and he was treated with 2 weeks of nitrofurantoin 100 mg twice daily.  He reports this initially improved his symptoms, but his symptoms recurred.  He denies any fevers or chills. ? ?He was hospitalized at [facility] in mid March 2023 for a CHF exacerbation, and urinalysis at that time grew 10k resistant E. coli that was susceptible only to ertapenem, gentamicin, imipenem, nitrofurantoin, Zosyn, and tobramycin.  He reportedly was treated with 3 doses of fosfomycin that again initially improved his symptoms, but they recurred after stopping antibiotics. ? ?He was unable to void for urinalysis today, and PVR is normal at 60 mL.  We discussed my concerns for persistent UTI with incomplete treatment, and discussed options including referral to infectious disease for consideration of IV antibiotics, versus trial of fosfomycin with 5 doses spaced over a 2-week course.  He opts for a repeat trial of the fosfomycin.  Return precautions were discussed extensively. ? ?Fosfomycin 3 g every 48 hours for 5 doses ?RTC 4 to 6 weeks symptom check and UA ?Consider referral to infectious disease if persistent UTI despite above treatment and consideration of IV antibiotics ? ?Billey Co, MD ? ?[facility] ?[address] ?[place], [place] [address] ?[phone] ? ? ?

## 2021-04-19 ENCOUNTER — Other Ambulatory Visit: Payer: Self-pay

## 2021-04-19 ENCOUNTER — Emergency Department: Payer: Medicare HMO

## 2021-04-19 ENCOUNTER — Telehealth: Payer: Self-pay | Admitting: Urology

## 2021-04-19 ENCOUNTER — Other Ambulatory Visit: Payer: Self-pay | Admitting: Family

## 2021-04-19 DIAGNOSIS — Z79899 Other long term (current) drug therapy: Secondary | ICD-10-CM | POA: Diagnosis not present

## 2021-04-19 DIAGNOSIS — E8770 Fluid overload, unspecified: Secondary | ICD-10-CM | POA: Diagnosis not present

## 2021-04-19 DIAGNOSIS — Z8616 Personal history of COVID-19: Secondary | ICD-10-CM | POA: Insufficient documentation

## 2021-04-19 DIAGNOSIS — R55 Syncope and collapse: Secondary | ICD-10-CM | POA: Diagnosis not present

## 2021-04-19 DIAGNOSIS — D649 Anemia, unspecified: Secondary | ICD-10-CM | POA: Diagnosis not present

## 2021-04-19 DIAGNOSIS — R944 Abnormal results of kidney function studies: Secondary | ICD-10-CM | POA: Diagnosis not present

## 2021-04-19 DIAGNOSIS — I509 Heart failure, unspecified: Secondary | ICD-10-CM | POA: Diagnosis not present

## 2021-04-19 DIAGNOSIS — Z85828 Personal history of other malignant neoplasm of skin: Secondary | ICD-10-CM | POA: Insufficient documentation

## 2021-04-19 DIAGNOSIS — R069 Unspecified abnormalities of breathing: Secondary | ICD-10-CM | POA: Diagnosis not present

## 2021-04-19 DIAGNOSIS — Z7901 Long term (current) use of anticoagulants: Secondary | ICD-10-CM | POA: Diagnosis not present

## 2021-04-19 DIAGNOSIS — Z7902 Long term (current) use of antithrombotics/antiplatelets: Secondary | ICD-10-CM | POA: Diagnosis not present

## 2021-04-19 DIAGNOSIS — I5033 Acute on chronic diastolic (congestive) heart failure: Secondary | ICD-10-CM | POA: Insufficient documentation

## 2021-04-19 DIAGNOSIS — R531 Weakness: Secondary | ICD-10-CM | POA: Diagnosis not present

## 2021-04-19 DIAGNOSIS — Z7982 Long term (current) use of aspirin: Secondary | ICD-10-CM | POA: Insufficient documentation

## 2021-04-19 DIAGNOSIS — J449 Chronic obstructive pulmonary disease, unspecified: Secondary | ICD-10-CM | POA: Diagnosis not present

## 2021-04-19 DIAGNOSIS — R0689 Other abnormalities of breathing: Secondary | ICD-10-CM | POA: Diagnosis not present

## 2021-04-19 DIAGNOSIS — I110 Hypertensive heart disease with heart failure: Secondary | ICD-10-CM | POA: Diagnosis not present

## 2021-04-19 DIAGNOSIS — N5089 Other specified disorders of the male genital organs: Secondary | ICD-10-CM | POA: Diagnosis not present

## 2021-04-19 DIAGNOSIS — R0902 Hypoxemia: Secondary | ICD-10-CM | POA: Diagnosis not present

## 2021-04-19 DIAGNOSIS — I861 Scrotal varices: Secondary | ICD-10-CM | POA: Diagnosis not present

## 2021-04-19 DIAGNOSIS — I2510 Atherosclerotic heart disease of native coronary artery without angina pectoris: Secondary | ICD-10-CM | POA: Diagnosis not present

## 2021-04-19 DIAGNOSIS — N50819 Testicular pain, unspecified: Secondary | ICD-10-CM | POA: Diagnosis not present

## 2021-04-19 DIAGNOSIS — R0602 Shortness of breath: Secondary | ICD-10-CM | POA: Diagnosis not present

## 2021-04-19 DIAGNOSIS — N433 Hydrocele, unspecified: Secondary | ICD-10-CM | POA: Diagnosis not present

## 2021-04-19 LAB — CBC
HCT: 32.4 % — ABNORMAL LOW (ref 39.0–52.0)
Hemoglobin: 9.6 g/dL — ABNORMAL LOW (ref 13.0–17.0)
MCH: 28.2 pg (ref 26.0–34.0)
MCHC: 29.6 g/dL — ABNORMAL LOW (ref 30.0–36.0)
MCV: 95.3 fL (ref 80.0–100.0)
Platelets: 172 10*3/uL (ref 150–400)
RBC: 3.4 MIL/uL — ABNORMAL LOW (ref 4.22–5.81)
RDW: 15.8 % — ABNORMAL HIGH (ref 11.5–15.5)
WBC: 6.5 10*3/uL (ref 4.0–10.5)
nRBC: 0 % (ref 0.0–0.2)

## 2021-04-19 LAB — BASIC METABOLIC PANEL
Anion gap: 9 (ref 5–15)
BUN: 20 mg/dL (ref 8–23)
CO2: 27 mmol/L (ref 22–32)
Calcium: 9.1 mg/dL (ref 8.9–10.3)
Chloride: 105 mmol/L (ref 98–111)
Creatinine, Ser: 1.25 mg/dL — ABNORMAL HIGH (ref 0.61–1.24)
GFR, Estimated: 59 mL/min — ABNORMAL LOW (ref >60)
Glucose, Bld: 99 mg/dL (ref 70–99)
Potassium: 4.5 mmol/L (ref 3.5–5.1)
Sodium: 141 mmol/L (ref 135–145)

## 2021-04-19 LAB — TROPONIN I (HIGH SENSITIVITY)
Troponin I (High Sensitivity): 27 ng/L — ABNORMAL HIGH (ref <18)
Troponin I (High Sensitivity): 29 ng/L — ABNORMAL HIGH (ref <18)

## 2021-04-19 MED ORDER — TORSEMIDE 20 MG PO TABS: 60.0000 mg | ORAL_TABLET | Freq: Every day | ORAL | 3 refills | Status: DC

## 2021-04-19 NOTE — ED Notes (Signed)
Pt to BR for urine specimen; odorous concentrated sample obtained ?

## 2021-04-19 NOTE — ED Triage Notes (Signed)
Pt presents via EMS c/o SOB x3 months. Reports wears 4-5 L of 02 at baseline at home. Reports increasing SOB at home over the last 3 months. Also reports groin pain x3 months.  ?

## 2021-04-19 NOTE — Telephone Encounter (Signed)
Pt's daughter, Baker Janus, called to find out if there is any other meds we can give him, due to the following being too expensive.  fosfomycin (MONUROL) 3 g PACK ? ?

## 2021-04-19 NOTE — Telephone Encounter (Signed)
Called pt's daughter, informed her that there is no alternative for pt per Dr. Diamantina Providence. Advised daughter that I faxed over a goodrx coupon to the pharmacy on Thursday that should make the RX much more affordable. Daughter voiced understanding.  ? ?[facility] to confirm [facility]. No answer as they are at lunch. Left VM.  ?

## 2021-04-20 ENCOUNTER — Other Ambulatory Visit: Payer: Self-pay

## 2021-04-20 ENCOUNTER — Emergency Department
Admission: EM | Admit: 2021-04-20 | Discharge: 2021-04-20 | Disposition: A | Discharge status: Home / Self Care | Payer: Medicare HMO | Attending: Emergency Medicine | Admitting: Emergency Medicine

## 2021-04-20 ENCOUNTER — Emergency Department: Payer: Medicare HMO

## 2021-04-20 DIAGNOSIS — N433 Hydrocele, unspecified: Secondary | ICD-10-CM | POA: Diagnosis not present

## 2021-04-20 DIAGNOSIS — I861 Scrotal varices: Secondary | ICD-10-CM | POA: Diagnosis not present

## 2021-04-20 DIAGNOSIS — I509 Heart failure, unspecified: Secondary | ICD-10-CM

## 2021-04-20 DIAGNOSIS — R1030 Lower abdominal pain, unspecified: Secondary | ICD-10-CM

## 2021-04-20 DIAGNOSIS — I110 Hypertensive heart disease with heart failure: Secondary | ICD-10-CM | POA: Diagnosis not present

## 2021-04-20 MED ORDER — FUROSEMIDE 10 MG/ML IJ SOLN
40.0000 mg | Freq: Once | INTRAMUSCULAR | Status: AC
  Administered 2021-04-20: 40 mg via INTRAVENOUS
  Filled 2021-04-20: qty 4

## 2021-04-20 MED ORDER — FOSFOMYCIN TROMETHAMINE 3 G PO PACK
3.0000 g | PACK | Freq: Once | ORAL | Status: AC
  Administered 2021-04-20: 3 g via ORAL
  Filled 2021-04-20: qty 3

## 2021-04-20 NOTE — Discharge Instructions (Signed)
Continue all medication as directed by your doctor.  Start Torsemide instead of Furosemide as directed by the heart failure clinic.  You were given a dose of antibiotic (Fosfomycin) tonight.  You will not need another dose for 48 hours.  Return to the ER for worsening symptoms, persistent vomiting, difficulty breathing or other concerns. ?

## 2021-04-20 NOTE — ED Provider Notes (Signed)
? ?[facility] ?Provider Note ? ? ? Event Date/Time  ? First MD Initiated Contact with Patient 04/20/21 0002   ?  (approximate) ? ? ?History  ? ?Shortness of Breath ? ? ?HPI ? ?[name] Browning is a 81 y.o. male brought to the ED via EMS from home with a chief complaint of shortness of breath x3 months.  Patient with a history of atrial fibrillation on anticoagulation, CHF, COPD on 4-5 L of nasal cannula oxygen at baseline.  Recent hospitalization 3/17-3/20/2023 at [facility] for CHF exacerbation.  Furosemide was switched to Torsemide yesterday by CHF clinic.  Patient states torsemide is too expensive for him to afford.  Also complains of testicular pain and swelling.  Denies fever, chills, cough, chest pain, abdominal pain, nausea, vomiting or hematuria. ?  ? ? ?Past Medical History  ? ?Past Medical History:  ?Diagnosis Date  ?? Anginal pain ([place])   ?? Anxiety   ?? Aortic atherosclerosis ([place])   ?? Atrial fibrillation ([place]) 01/2019  ?? Bilateral carpal tunnel syndrome   ?? CAD (coronary artery disease)   ? a.) LHC 2004 --> normal coronaries. b.) normal stress test in 2007 and 2011; c.) Lexiscan 05/20/2014 --> LVEF 55-65%; no significant stress induced ischemia/arrythmia. d.) CT chest 03/25/2019 --> coronaries carcified.  ?? Carotid atherosclerosis, bilateral   ?? Carpal tunnel syndrome, bilateral   ?? CHF (congestive heart failure) ([place])   ?? Chronic anticoagulation   ? a.) ASA + apixaban  ?? COPD (chronic obstructive pulmonary disease) ([place])   ?? CVA (cerebral vascular accident) ([place])   ?? Degenerative disc disease, cervical   ?? Diastolic dysfunction   ? a.) TTE 05/29/2014 --> LVEF 60-65%; G1DD.  ?? DVT (deep venous thrombosis) ([place])   ?? GERD (gastroesophageal reflux disease)   ?? History of 2019 novel coronavirus disease (COVID-19) 02/08/2019  ?? HLD (hyperlipidemia)   ?? Hypertension   ?? Kidney stones   ?? Osteoarthritis of right shoulder   ?? Pneumonia   ?? Respiratory failure, acute ([place])  09/20/2020  ? a.) severe respiratory distress 1 hour after urological surgery. CXR (+) for acute pulmonary edema. Transferred to ICU and placed on NIPPV. Questionable aspiration PNA. (+) A.fib with RVR. Improved with ABX, diuresis, and amiodarone.  ?? Skin cancer of face   ? a.) RIGHT ear and RIGHT forehead; excised.  ?? Syncope   ?? TIA (transient ischemic attack) 2016  ?? Valvular regurgitation   ? a.) TTE 05/26/2014 --> LVEF 60-65%; trivial MR, mild TR; no AR or PR. b.) TTE 04/20/2015 --> LVEF 55-60%; trivial MR and PR; no AR or TR.  ? ? ? ?Active Problem List  ? ?Patient Active Problem List  ? Diagnosis Date Noted  ?? Acute ST elevation myocardial infarction (STEMI) of anterior wall (HCC)   ?? Cardiogenic shock ([place])   ?? Hemorrhagic shock ([place])   ?? Acute on chronic diastolic CHF (congestive heart failure) ([place]) 11/11/2020  ?? GI bleeding 11/11/2020  ?? DVT (deep venous thrombosis) ([place])   ?? HLD (hyperlipidemia)   ?? Stroke [facility])   ?? CAD (coronary artery disease)   ?? NSTEMI (non-ST elevated myocardial infarction) ([place])   ?? Hypokalemia   ?? Thrombocytopenia ([place])   ?? Iron deficiency anemia   ?? Dysphagia   ?? Lactic acidosis   ?? Atrial fibrillation ([place])   ?? Septic shock ([place]) 10/21/2020  ?? Acute on chronic respiratory failure with hypoxia ([place]) 10/20/2020  ?? Community acquired pneumonia 10/20/2020  ?? Hypotension 10/20/2020  ??  DOE (dyspnea on exertion) 10/06/2020  ?? Chronic respiratory failure with hypoxia ([place]) 10/06/2020  ?? Malnutrition of moderate degree 09/21/2020  ?? Urinary tract obstruction by kidney stone 09/19/2020  ?? AKI (acute kidney injury) ([place]) 09/19/2020  ?? COPD exacerbation ([place]) 09/18/2020  ?? Acute respiratory failure with hypoxia (HCC) 09/18/2020  ?? AF (paroxysmal atrial fibrillation) ([place]) 09/18/2020  ?? Umbilical hernia without obstruction and without gangrene   ?? Pneumonia 03/25/2019  ?? History of 2019 novel coronavirus disease (COVID-19) 03/06/2019  ?? COVID-19 virus  infection 02/08/2019  ?? New onset atrial fibrillation ([place]) 02/08/2019  ?? COPD, moderate ([place]) 09/14/2018  ?? Degenerative tear of glenoid labrum of right shoulder 08/24/2017  ?? Injury of tendon of long head of right biceps 08/21/2017  ?? Rotator cuff tendinitis, right 08/21/2017  ?? Traumatic complete tear of right rotator cuff 08/21/2017  ?? Chronic neck pain 03/09/2017  ?? Degenerative disc disease, cervical 03/09/2017  ?? Perennial allergic rhinitis 03/09/2017  ?? Hyperglycemia 09/01/2015  ?? Elevated rheumatoid factor 04/08/2015  ?? Arthritis of both hands 04/01/2015  ?? Bilateral hand pain 03/18/2015  ?? Elevated alkaline phosphatase level 03/02/2015  ?? Anxiety 02/16/2015  ?? Intermittent chest pain 02/16/2015  ?? History of TIA (transient ischemic attack) 05/18/2014  ?? HTN (hypertension) 05/18/2014  ?? Hyperlipidemia 05/18/2014  ?? Status post carpal tunnel release 06/26/2013  ? ? ? ?Past Surgical History  ? ?Past Surgical History:  ?Procedure Laterality Date  ?? CARDIAC CATHETERIZATION  2004  ?? CARPAL TUNNEL RELEASE Right 06/11/2013  ?? CENTRAL LINE INSERTION  11/14/2020  ? Procedure: CENTRAL LINE INSERTION;  Surgeon: Leonie Man, MD;  Location: [place] [facility];  Service: Cardiovascular;;  ?? CORONARY/GRAFT ACUTE MI REVASCULARIZATION N/A 11/14/2020  ? Procedure: Coronary/Graft Acute MI Revascularization;  Surgeon: Leonie Man, MD;  Location: [place] [facility];  Service: Cardiovascular;  Laterality: N/A;  ?? CYSTOSCOPY W/ RETROGRADES  10/16/2020  ? Procedure: CYSTOSCOPY WITH RETROGRADE PYELOGRAM;  Surgeon: Billey Co, MD;  Location: [facility] OR[facility];  Service: Urology;;  ?? CYSTOSCOPY W/ URETERAL STENT PLACEMENT Left 09/20/2020  ? Procedure: CYSTOSCOPY WITH RETROGRADE PYELOGRAM/URETERAL STENT PLACEMENT;  Surgeon: Janith Lima, MD;  Location: [facility] OR[facility];  Service: Urology;  Laterality: Left;  ?? CYSTOSCOPY/URETEROSCOPY/HOLMIUM LASER/STENT PLACEMENT Left 10/16/2020  ? Procedure:  CYSTOSCOPY/URETEROSCOPY/HOLMIUM LASER/STENT PLACEMENT;  Surgeon: Billey Co, MD;  Location: [facility];  Service: Urology;  Laterality: Left;  ?? ESOPHAGOGASTRODUODENOSCOPY N/A 11/06/2020  ? Procedure: ESOPHAGOGASTRODUODENOSCOPY (EGD);  Surgeon: Lucilla Lame, MD;  Location: [facility] ENDOSCOPY;  Service: Endoscopy;  Laterality: N/A;  ?? LEFT HEART CATH AND CORONARY ANGIOGRAPHY N/A 11/14/2020  ? Procedure: LEFT HEART CATH AND CORONARY ANGIOGRAPHY;  Surgeon: Leonie Man, MD;  Location: [place] [facility];  Service: Cardiovascular;  Laterality: N/A;  ?? SHOULDER ARTHROSCOPY WITH OPEN ROTATOR CUFF REPAIR Right 08/24/2017  ? Procedure: SHOULDER ARTHROSCOPY WITH OPEN ROTATOR CUFF REPAIR;  Surgeon: Corky Mull, MD;  Location: [facility] ORS;  Service: Orthopedics;  Laterality: Right;  ?? SKIN CANCER EXCISION  12/01/2016  ? right ear   ?? SKIN CANCER EXCISION    ? remove from the right side of the face   ?? THROMBECTOMY Right 2004  ? leg  ?? THYROIDECTOMY  1950  ? Not sure if total or partial thyroidectomy.  ? ? ? ?Home Medications  ? ?Prior to Admission medications   ?Medication Sig Start Date End Date Taking? Authorizing Provider  ?acetaminophen (TYLENOL) 500 MG tablet Take 2 tablets (1,000 mg total) by mouth  every 6 (six) hours as needed for mild pain. 05/07/20  Yes Piscoya, Jacqulyn Bath, MD  ?albuterol (VENTOLIN HFA) 108 (90 Base) MCG/ACT inhaler Inhale into the lungs every 6 (six) hours as needed for wheezing or shortness of breath.   Yes [provider]  ?amLODipine (NORVASC) 5 MG tablet Take 5 mg by mouth daily. 01/08/21  Yes [provider]  ?amoxicillin (AMOXIL) 500 MG tablet Take 500 mg by mouth every 8 (eight) hours. 04/13/21  Yes [provider]  ?aspirin EC 81 MG tablet Take 81 mg by mouth daily after supper.   Yes [provider]  ?atorvastatin (LIPITOR) 40 MG tablet Take 40 mg by mouth daily after supper.   Yes [provider]  ?Budeson-Glycopyrrol-Formoterol (BREZTRI  AEROSPHERE) 160-9-4.8 MCG/ACT AERO Inhale 2 puffs into the lungs in the morning and at bedtime. 02/17/21  Yes Flora Lipps, MD  ?cetirizine (ZYRTEC) 10 MG tablet Take 10 mg by mouth daily. 09/29/20  Yes [provider]  ?cl

## 2021-04-24 NOTE — Progress Notes (Signed)
? Patient ID: [name] Browning, male    DOB: 03-03-1940, 81 y.o.   MRN: [id number] ? ?HPI ? ?Mr Wafer is a 81 y/o male with a history of carotid disease, CAD, hyperlipidemia, HTN, CKD, stroke, anxiety, atrial fibrillation, COPD, DVT, GERD, previous tobacco use and chronic heart failure.  ? ?Echo report from 03/17/2021 reviewed and showed an EF of 60-65%. Echo report from 11/15/20 reviewed and showed an EF of 60-65% along with severe LAE and mild MR.  ? ?LHC done 11/14/20 showed: ?Ost RCA lesion is 95% stenosed. ?  Prox RCA to Dist RCA lesion is 100% stenosed. -Very minimal flow.  After initial angiography, it disappeared to progressed to the ostium. ?  Left-to-right collaterals via septal perforators fill the PDA system. ?  Mid LM to Dist LM lesion is 20% stenosed. ?  Ost LAD to Prox LAD lesion is 25% stenosed.  Relatively small caliber vessel that wraps the apex. ?  Diminutive LCx with moderate sized OM1, mild disease. ?  The left ventricular systolic function is normal. No obvious regional wall motion normalities. ?  The left ventricular ejection fraction is 55-65% by visual estimate. ?  LV end diastolic pressure is moderately elevated.  18 mmHg ?  There is no aortic valve stenosis. ? ?Was in the ED 04/20/21 due to acute on chronic HF. IV lasix given with improvement of symptoms and he was released. Admitted 3/17-3/20/23 for COPD exacerbation and PNA, eventually discharged home. In the ED overnight 3/15-3/16/23 for CAP, given abx and discharged home. Admitted 3/4-3/11/23 with acute on chronic respiratory failure, COPD and HF exacerbation, discharged home. Was in the ED 01/09/21 due to left-sided epistaxis. While being triaged, nosebleed stopped with pressure and he was released. Recent admission at [facility]. Admitted 11/11/20 due to shortness of breath due to acute on chronic HF. Initially treated with IV lasix with transition to oral diuretics. Elevated troponin. Eliquis held due to GIB. GI and cardiology consults  obtained. Started on IV protonix. Developed cardiogenic shock and treated with levophed. Renal function worsened due to dehydration and contrast dye but improved with IV hydration. Antibiotics started due to UTI. Discharged after 21 days. Admitted twice in October.  ? ?He presents today for a follow-up visit with a chief complaint of moderate fatigue with minimal exertion. He describes this as chronic in nature having been present for several years. He has associated decreased appetite, cough, shortness of breath, palpitations, difficulty sleeping, weakness and intermittent dizziness. He denies any abdominal distention, pedal edema, chest pain or wheezing.  ? ?Feels like his heart is fine but that his lungs are worsening. Says that his oxygen level is dropping quickly at home even when wearing his oxygen. Says that he doesn't have to walk very far and it drops into the 70's even on 5L but then quickly recovers into the 90's after sitting for just a few minutes and taking some deep breaths.  ? ?No longer has any scales so hasn't been weighing himself daily.  ? ?Past Medical History:  ?Diagnosis Date  ? Anginal pain ([place])   ? Anxiety   ? Aortic atherosclerosis ([place])   ? Atrial fibrillation ([place]) 01/2019  ? Bilateral carpal tunnel syndrome   ? CAD (coronary artery disease)   ? a.) LHC 2004 --> normal coronaries. b.) normal stress test in 2007 and 2011; c.) Lexiscan 05/20/2014 --> LVEF 55-65%; no significant stress induced ischemia/arrythmia. d.) CT chest 03/25/2019 --> coronaries carcified.  ? Carotid atherosclerosis, bilateral   ?  Carpal tunnel syndrome, bilateral   ? CHF (congestive heart failure) ([place])   ? Chronic anticoagulation   ? a.) ASA + apixaban  ? COPD (chronic obstructive pulmonary disease) ([place])   ? CVA (cerebral vascular accident) [facility])   ? Degenerative disc disease, cervical   ? Diastolic dysfunction   ? a.) TTE 05/29/2014 --> LVEF 60-65%; G1DD.  ? DVT (deep venous thrombosis) ([place])   ? GERD  (gastroesophageal reflux disease)   ? History of 2019 novel coronavirus disease (COVID-19) 02/08/2019  ? HLD (hyperlipidemia)   ? Hypertension   ? Kidney stones   ? Osteoarthritis of right shoulder   ? Pneumonia   ? Respiratory failure, acute ([place]) 09/20/2020  ? a.) severe respiratory distress 1 hour after urological surgery. CXR (+) for acute pulmonary edema. Transferred to ICU and placed on NIPPV. Questionable aspiration PNA. (+) A.fib with RVR. Improved with ABX, diuresis, and amiodarone.  ? Skin cancer of face   ? a.) RIGHT ear and RIGHT forehead; excised.  ? Syncope   ? TIA (transient ischemic attack) 2016  ? Valvular regurgitation   ? a.) TTE 05/26/2014 --> LVEF 60-65%; trivial MR, mild TR; no AR or PR. b.) TTE 04/20/2015 --> LVEF 55-60%; trivial MR and PR; no AR or TR.  ? ?Past Surgical History:  ?Procedure Laterality Date  ? CARDIAC CATHETERIZATION  2004  ? CARPAL TUNNEL RELEASE Right 06/11/2013  ? CENTRAL LINE INSERTION  11/14/2020  ? Procedure: CENTRAL LINE INSERTION;  Surgeon: Leonie Man, MD;  Location: [place] [facility];  Service: Cardiovascular;;  ? CORONARY/GRAFT ACUTE MI REVASCULARIZATION N/A 11/14/2020  ? Procedure: Coronary/Graft Acute MI Revascularization;  Surgeon: Leonie Man, MD;  Location: [place] [facility];  Service: Cardiovascular;  Laterality: N/A;  ? CYSTOSCOPY W/ RETROGRADES  10/16/2020  ? Procedure: CYSTOSCOPY WITH RETROGRADE PYELOGRAM;  Surgeon: Billey Co, MD;  Location: [facility];  Service: Urology;;  ? CYSTOSCOPY W/ URETERAL STENT PLACEMENT Left 09/20/2020  ? Procedure: CYSTOSCOPY WITH RETROGRADE PYELOGRAM/URETERAL STENT PLACEMENT;  Surgeon: Janith Lima, MD;  Location: [facility] OR[facility];  Service: Urology;  Laterality: Left;  ? CYSTOSCOPY/URETEROSCOPY/HOLMIUM LASER/STENT PLACEMENT Left 10/16/2020  ? Procedure: CYSTOSCOPY/URETEROSCOPY/HOLMIUM LASER/STENT PLACEMENT;  Surgeon: Billey Co, MD;  Location: [facility];  Service: Urology;  Laterality: Left;  ?  ESOPHAGOGASTRODUODENOSCOPY N/A 11/06/2020  ? Procedure: ESOPHAGOGASTRODUODENOSCOPY (EGD);  Surgeon: Lucilla Lame, MD;  Location: [facility] ENDOSCOPY;  Service: Endoscopy;  Laterality: N/A;  ? LEFT HEART CATH AND CORONARY ANGIOGRAPHY N/A 11/14/2020  ? Procedure: LEFT HEART CATH AND CORONARY ANGIOGRAPHY;  Surgeon: Leonie Man, MD;  Location: [place] [facility];  Service: Cardiovascular;  Laterality: N/A;  ? SHOULDER ARTHROSCOPY WITH OPEN ROTATOR CUFF REPAIR Right 08/24/2017  ? Procedure: SHOULDER ARTHROSCOPY WITH OPEN ROTATOR CUFF REPAIR;  Surgeon: Corky Mull, MD;  Location: [facility] OR[facility];  Service: Orthopedics;  Laterality: Right;  ? SKIN CANCER EXCISION  12/01/2016  ? right ear   ? SKIN CANCER EXCISION    ? remove from the right side of the face   ? THROMBECTOMY Right 2004  ? leg  ? THYROIDECTOMY  1950  ? Not sure if total or partial thyroidectomy.  ? ?Family History  ?Problem Relation Age of Onset  ? Hypertension Mother   ? Heart disease Mother   ? CAD Father   ? Heart attack Father   ? ?Social History  ? ?Tobacco Use  ? Smoking status: Former  ?  Packs/day: 1.00  ?  Years: 46.00  ?  Pack years: 46.00  ?  Types: Cigarettes  ?  Start date: 01/10/1957  ?  Quit date: 02/11/2003  ?  Years since quitting: 18.2  ?  Passive exposure: Past  ? Smokeless tobacco: Former  ?  Types: Snuff  ?  Quit date: 02/11/2003  ?Substance Use Topics  ? Alcohol use: Not Currently  ?  Alcohol/week: 7.0 standard drinks  ?  Types: 7 Cans of beer per week  ? ?Allergies  ?Allergen Reactions  ? Hydromorphone   ?  Hallucination ?Pt reports he doesn't know about this  ? ?Prior to Admission medications   ?Medication Sig Start Date End Date Taking? Authorizing Provider  ?acetaminophen (TYLENOL) 500 MG tablet Take 2 tablets (1,000 mg total) by mouth every 6 (six) hours as needed for mild pain. 05/07/20  Yes Piscoya, Jacqulyn Bath, MD  ?albuterol (VENTOLIN HFA) 108 (90 Base) MCG/ACT inhaler Inhale into the lungs every 6 (six) hours as needed for wheezing or shortness of  breath.   Yes [provider]  ?amLODipine (NORVASC) 5 MG tablet Take 5 mg by mouth daily. 01/08/21  Yes [provider]  ?amoxicillin (AMOXIL) 500 MG tablet Take 500 mg by mouth every 8 (eight) hours. 04/13/21  Yes Provi

## 2021-04-26 ENCOUNTER — Encounter: Payer: Self-pay | Admitting: Family

## 2021-04-26 ENCOUNTER — Ambulatory Visit: Payer: Medicare HMO | Attending: Family | Admitting: Family

## 2021-04-26 VITALS — BP 130/67 | HR 75 | Resp 18 | Ht 74.0 in | Wt 186.0 lb

## 2021-04-26 DIAGNOSIS — I10 Essential (primary) hypertension: Secondary | ICD-10-CM

## 2021-04-26 DIAGNOSIS — I5032 Chronic diastolic (congestive) heart failure: Secondary | ICD-10-CM

## 2021-04-26 DIAGNOSIS — J440 Chronic obstructive pulmonary disease with acute lower respiratory infection: Secondary | ICD-10-CM | POA: Diagnosis not present

## 2021-04-26 DIAGNOSIS — Z9981 Dependence on supplemental oxygen: Secondary | ICD-10-CM | POA: Insufficient documentation

## 2021-04-26 DIAGNOSIS — J441 Chronic obstructive pulmonary disease with (acute) exacerbation: Secondary | ICD-10-CM | POA: Diagnosis not present

## 2021-04-26 DIAGNOSIS — I4891 Unspecified atrial fibrillation: Secondary | ICD-10-CM | POA: Insufficient documentation

## 2021-04-26 DIAGNOSIS — Z8673 Personal history of transient ischemic attack (TIA), and cerebral infarction without residual deficits: Secondary | ICD-10-CM | POA: Insufficient documentation

## 2021-04-26 DIAGNOSIS — N200 Calculus of kidney: Secondary | ICD-10-CM

## 2021-04-26 DIAGNOSIS — Z87891 Personal history of nicotine dependence: Secondary | ICD-10-CM | POA: Diagnosis not present

## 2021-04-26 DIAGNOSIS — K219 Gastro-esophageal reflux disease without esophagitis: Secondary | ICD-10-CM | POA: Insufficient documentation

## 2021-04-26 DIAGNOSIS — E785 Hyperlipidemia, unspecified: Secondary | ICD-10-CM | POA: Diagnosis not present

## 2021-04-26 DIAGNOSIS — N189 Chronic kidney disease, unspecified: Secondary | ICD-10-CM | POA: Insufficient documentation

## 2021-04-26 DIAGNOSIS — I4811 Longstanding persistent atrial fibrillation: Secondary | ICD-10-CM | POA: Diagnosis not present

## 2021-04-26 DIAGNOSIS — I2510 Atherosclerotic heart disease of native coronary artery without angina pectoris: Secondary | ICD-10-CM | POA: Insufficient documentation

## 2021-04-26 DIAGNOSIS — J449 Chronic obstructive pulmonary disease, unspecified: Secondary | ICD-10-CM | POA: Diagnosis not present

## 2021-04-26 DIAGNOSIS — I130 Hypertensive heart and chronic kidney disease with heart failure and stage 1 through stage 4 chronic kidney disease, or unspecified chronic kidney disease: Secondary | ICD-10-CM | POA: Diagnosis not present

## 2021-04-26 NOTE — Patient Instructions (Addendum)
Resume weighing daily and call for an overnight weight gain of 3 pounds or more or a weekly weight gain of more than 5 pounds.   If you have voicemail, please make sure your mailbox is cleaned out so that we may leave a message and please make sure to listen to any voicemails.     

## 2021-05-05 DIAGNOSIS — J449 Chronic obstructive pulmonary disease, unspecified: Secondary | ICD-10-CM | POA: Diagnosis not present

## 2021-05-06 DIAGNOSIS — R072 Precordial pain: Secondary | ICD-10-CM | POA: Diagnosis not present

## 2021-05-13 DIAGNOSIS — R079 Chest pain, unspecified: Secondary | ICD-10-CM | POA: Diagnosis not present

## 2021-05-13 DIAGNOSIS — K219 Gastro-esophageal reflux disease without esophagitis: Secondary | ICD-10-CM | POA: Diagnosis not present

## 2021-05-13 DIAGNOSIS — J449 Chronic obstructive pulmonary disease, unspecified: Secondary | ICD-10-CM | POA: Diagnosis not present

## 2021-05-13 DIAGNOSIS — I340 Nonrheumatic mitral (valve) insufficiency: Secondary | ICD-10-CM | POA: Diagnosis not present

## 2021-05-13 DIAGNOSIS — E782 Mixed hyperlipidemia: Secondary | ICD-10-CM | POA: Diagnosis not present

## 2021-05-13 DIAGNOSIS — J159 Unspecified bacterial pneumonia: Secondary | ICD-10-CM | POA: Diagnosis not present

## 2021-05-13 DIAGNOSIS — I10 Essential (primary) hypertension: Secondary | ICD-10-CM | POA: Diagnosis not present

## 2021-05-13 DIAGNOSIS — I4891 Unspecified atrial fibrillation: Secondary | ICD-10-CM | POA: Diagnosis not present

## 2021-05-14 DIAGNOSIS — J479 Bronchiectasis, uncomplicated: Secondary | ICD-10-CM | POA: Diagnosis not present

## 2021-05-20 ENCOUNTER — Ambulatory Visit (INDEPENDENT_AMBULATORY_CARE_PROVIDER_SITE_OTHER): Payer: Medicare HMO | Admitting: Urology

## 2021-05-20 ENCOUNTER — Telehealth: Payer: Self-pay | Admitting: Urology

## 2021-05-20 ENCOUNTER — Encounter: Payer: Self-pay | Admitting: Urology

## 2021-05-20 VITALS — BP 122/54 | HR 82 | Ht 74.0 in | Wt 184.0 lb

## 2021-05-20 DIAGNOSIS — Z87442 Personal history of urinary calculi: Secondary | ICD-10-CM | POA: Diagnosis not present

## 2021-05-20 DIAGNOSIS — N200 Calculus of kidney: Secondary | ICD-10-CM | POA: Diagnosis not present

## 2021-05-20 DIAGNOSIS — N390 Urinary tract infection, site not specified: Secondary | ICD-10-CM | POA: Diagnosis not present

## 2021-05-20 LAB — URINALYSIS, COMPLETE
Bilirubin, UA: NEGATIVE
Ketones, UA: NEGATIVE
Nitrite, UA: POSITIVE — AB
Specific Gravity, UA: 1.015 (ref 1.005–1.030)
Urobilinogen, Ur: 1 mg/dL (ref 0.2–1.0)
pH, UA: 6.5 (ref 5.0–7.5)

## 2021-05-20 LAB — MICROSCOPIC EXAMINATION: WBC, UA: >30 /hpf — AB (ref 0–5)

## 2021-05-20 LAB — BLADDER SCAN AMB NON-IMAGING: Scan Result: 15

## 2021-05-20 NOTE — Progress Notes (Signed)
? ?  05/20/2021 ?12:21 PM  ? ?[name] Browning ?1940/10/03 ?[id number] ? ?Reason for visit: Recurrent UTI, UTI symptoms, history of nephrolithiasis ? ?HPI: ?Comorbid 81 year old male with history of a left-sided infected stone and stent placement who underwent definitive ureteroscopy in October 2022, and stent was left on a Dangler and subsequently removed.  There were no bladder abnormalities at that time.  He was admitted for pneumonia/UTI in November 2022, and CT showed no hydronephrosis or ureteral stones, small left lower pole stone fragments.  I personally viewed and interpreted those images.  Urine culture ultimately grew resistant E. coli. ? ?In February 2023 he reported about a month of urinary urgency, frequency, dysuria, urinalysis ultimately grew resistant E. coli, and he was treated with 2 weeks of nitrofurantoin 100 mg twice daily.  This initially improved his urinary symptoms, but symptoms recurred within a few weeks of stopping antibiotics. ? ?He was hospitalized at [facility] in mid March 2023 for a CHF exacerbation, and urinalysis at that time grew 10k resistant E. coli that was susceptible only to ertapenem, gentamicin, imipenem, nitrofurantoin, Zosyn, and tobramycin.  He reportedly was treated with 3 doses of fosfomycin that again initially improved his symptoms, but they recurred after stopping antibiotics. ? ?At our last visit in April 2023, he reported recurrence of his urinary symptoms consistent with infection, and we opted for a trial of fosfomycin with 5 doses spaced over 2-week course.  He was unable to void for urine sample at that time, however bladder scan was normal at 60 mL.  Again, he had symptom improvement while on the fosfomycin, but symptoms recurred after stopping antibiotics. ? ?Urinalysis today again appears infected with greater than 30 WBCs, 0-2 RBCs, moderate bacteria, nitrite positive, 3+ leukocytes.  Will send for culture and atypicals.  Bladder scan today normal at 15 mL, and he  continues to empty well. ? ?I had a frank conversation with the patient that he has had multidrug-resistant bacteria, and likely requires a course of IV antibiotics with better tissue penetration compared to the nitrofurantoin and fosfomycin he has been on previously.  We also discussed considering repeating an imaging study, but imaging in October was reassuring regarding no obstruction or ureteral stones.  He also is on oxygen and has a number of comorbidities that would make any further surgeries high risk unless absolutely necessary. ? ?-Referral placed to infectious disease to consider longer course of IV antibiotics with better tissue penetration based on his prior MDR E. Coli ?-RTC 3 months with urology symptom check ? ?Billey Co, MD ? ?[facility] ?[address], [address] ?[place], [place] [address] ?[phone] ? ?

## 2021-05-20 NOTE — Telephone Encounter (Signed)
Spoke with daughter and she will call primary care. ? ? ?

## 2021-05-20 NOTE — Telephone Encounter (Signed)
Pt's daughter called office and said the med list he was sent home with are incorrect and wants someone to give her a call. ?

## 2021-05-23 LAB — CULTURE, URINE COMPREHENSIVE

## 2021-05-25 ENCOUNTER — Ambulatory Visit: Payer: Medicare HMO | Admitting: Family

## 2021-05-31 ENCOUNTER — Other Ambulatory Visit: Payer: Self-pay | Admitting: Family

## 2021-05-31 DIAGNOSIS — R079 Chest pain, unspecified: Secondary | ICD-10-CM | POA: Diagnosis not present

## 2021-05-31 DIAGNOSIS — E782 Mixed hyperlipidemia: Secondary | ICD-10-CM | POA: Diagnosis not present

## 2021-05-31 DIAGNOSIS — Z9861 Coronary angioplasty status: Secondary | ICD-10-CM | POA: Diagnosis not present

## 2021-05-31 DIAGNOSIS — I340 Nonrheumatic mitral (valve) insufficiency: Secondary | ICD-10-CM | POA: Diagnosis not present

## 2021-05-31 DIAGNOSIS — R0602 Shortness of breath: Secondary | ICD-10-CM | POA: Diagnosis not present

## 2021-05-31 DIAGNOSIS — I4891 Unspecified atrial fibrillation: Secondary | ICD-10-CM | POA: Diagnosis not present

## 2021-05-31 DIAGNOSIS — K219 Gastro-esophageal reflux disease without esophagitis: Secondary | ICD-10-CM | POA: Diagnosis not present

## 2021-05-31 DIAGNOSIS — J449 Chronic obstructive pulmonary disease, unspecified: Secondary | ICD-10-CM | POA: Diagnosis not present

## 2021-05-31 DIAGNOSIS — I10 Essential (primary) hypertension: Secondary | ICD-10-CM | POA: Diagnosis not present

## 2021-06-03 ENCOUNTER — Ambulatory Visit: Payer: Medicare HMO | Attending: Infectious Diseases | Admitting: Infectious Diseases

## 2021-06-03 ENCOUNTER — Encounter: Payer: Self-pay | Admitting: Infectious Diseases

## 2021-06-03 VITALS — BP 101/63 | HR 77 | Temp 98.4°F

## 2021-06-03 DIAGNOSIS — I509 Heart failure, unspecified: Secondary | ICD-10-CM | POA: Diagnosis not present

## 2021-06-03 DIAGNOSIS — I110 Hypertensive heart disease with heart failure: Secondary | ICD-10-CM | POA: Insufficient documentation

## 2021-06-03 DIAGNOSIS — B9620 Unspecified Escherichia coli [E. coli] as the cause of diseases classified elsewhere: Secondary | ICD-10-CM | POA: Insufficient documentation

## 2021-06-03 DIAGNOSIS — N401 Enlarged prostate with lower urinary tract symptoms: Secondary | ICD-10-CM | POA: Insufficient documentation

## 2021-06-03 DIAGNOSIS — I4891 Unspecified atrial fibrillation: Secondary | ICD-10-CM | POA: Diagnosis not present

## 2021-06-03 DIAGNOSIS — I2510 Atherosclerotic heart disease of native coronary artery without angina pectoris: Secondary | ICD-10-CM | POA: Insufficient documentation

## 2021-06-03 DIAGNOSIS — A498 Other bacterial infections of unspecified site: Secondary | ICD-10-CM

## 2021-06-03 DIAGNOSIS — N390 Urinary tract infection, site not specified: Secondary | ICD-10-CM

## 2021-06-03 DIAGNOSIS — Z8616 Personal history of COVID-19: Secondary | ICD-10-CM | POA: Insufficient documentation

## 2021-06-03 DIAGNOSIS — J449 Chronic obstructive pulmonary disease, unspecified: Secondary | ICD-10-CM | POA: Diagnosis not present

## 2021-06-03 DIAGNOSIS — Z955 Presence of coronary angioplasty implant and graft: Secondary | ICD-10-CM | POA: Insufficient documentation

## 2021-06-03 DIAGNOSIS — Z9981 Dependence on supplemental oxygen: Secondary | ICD-10-CM | POA: Diagnosis not present

## 2021-06-03 DIAGNOSIS — Z7902 Long term (current) use of antithrombotics/antiplatelets: Secondary | ICD-10-CM | POA: Diagnosis not present

## 2021-06-03 DIAGNOSIS — E785 Hyperlipidemia, unspecified: Secondary | ICD-10-CM | POA: Insufficient documentation

## 2021-06-03 DIAGNOSIS — Z1612 Extended spectrum beta lactamase (ESBL) resistance: Secondary | ICD-10-CM | POA: Diagnosis not present

## 2021-06-03 MED ORDER — ERTAPENEM SODIUM 1 G IJ SOLR: 1.0000 g | INTRAMUSCULAR | Status: AC

## 2021-06-03 NOTE — Progress Notes (Signed)
NAME: MARCELINO CAMPOS  DOB: April 22, 1940  MRN: [id number]  Date/Time: 06/03/2021 9:02 AM  REQUESTING PROVIDER; Dr.Sninsky Subjective:  REASON FOR CONSULT: ESBL e.coli UTi ? NASH BOLLS is a 81 y.o. with a history of COPD, CHF, Afib, CAD s/p stent, HTN, HLD, renal stone, BPH Referred to me for ESBL ecoli in the urine with LUTS of frequency, inability to pass urine freely, but no hematuria, dysuria, abdominal pain, fever or flank pain Pt was in admitted in sept  2022 for abdominal pain and sob and found to have left proximal ureteral stone with left hydronephosis. He underwent stent placement on 09/20/21. He underwent on 10/16/20 Cystoscopy, left ureteroscopy, laser lithotripsy, left retrograde pyelogram with intraoperative interpretation, left ureteral stent placement with dangler by Dr.Sninsky, and it was removed on 10/22/20. He saw Dr.Sninsky on 03/04/20 and and was treated with nitrofurantion for 10 days for ESBL e.coliu On 04/15/21 he was given fosfomycin 3 doses He still complains of frequency, difficulty in passing urine So referred to me Last culture from 05/20/21 ESBL e.coli Pt has home oxygen He lives in a trailer with his wife who is not well  Past Medical History:  Diagnosis Date   Anginal pain ([place])    Anxiety    Aortic atherosclerosis ([place])    Atrial fibrillation ([place]) 01/2019   Bilateral carpal tunnel syndrome    CAD (coronary artery disease)    a.) LHC 2004 --> normal coronaries. b.) normal stress test in 2007 and 2011; c.) Lexiscan 05/20/2014 --> LVEF 55-65%; no significant stress induced ischemia/arrythmia. d.) CT chest 03/25/2019 --> coronaries carcified.   Carotid atherosclerosis, bilateral    Carpal tunnel syndrome, bilateral    CHF (congestive heart failure) (HCC)    Chronic anticoagulation    a.) ASA + apixaban   COPD (chronic obstructive pulmonary disease) (HCC)    CVA (cerebral vascular accident) ([place])    Degenerative disc disease, cervical    Diastolic dysfunction     a.) TTE 05/29/2014 --> LVEF 60-65%; G1DD.   DVT (deep venous thrombosis) (HCC)    GERD (gastroesophageal reflux disease)    History of 2019 novel coronavirus disease (COVID-19) 02/08/2019   HLD (hyperlipidemia)    Hypertension    Kidney stones    Osteoarthritis of right shoulder    Pneumonia    Respiratory failure, acute ([place]) 09/20/2020   a.) severe respiratory distress 1 hour after urological surgery. CXR (+) for acute pulmonary edema. Transferred to ICU and placed on NIPPV. Questionable aspiration PNA. (+) A.fib with RVR. Improved with ABX, diuresis, and amiodarone.   Skin cancer of face    a.) RIGHT ear and RIGHT forehead; excised.   Syncope    TIA (transient ischemic attack) 2016   Valvular regurgitation    a.) TTE 05/26/2014 --> LVEF 60-65%; trivial MR, mild TR; no AR or PR. b.) TTE 04/20/2015 --> LVEF 55-60%; trivial MR and PR; no AR or TR.    Past Surgical History:  Procedure Laterality Date   CARDIAC CATHETERIZATION  2004   CARPAL TUNNEL RELEASE Right 06/11/2013   CENTRAL LINE INSERTION  11/14/2020   Procedure: CENTRAL LINE INSERTION;  Surgeon: Leonie Man, MD;  Location: [place] [facility];  Service: Cardiovascular;;   CORONARY/GRAFT ACUTE MI REVASCULARIZATION N/A 11/14/2020   Procedure: Coronary/Graft Acute MI Revascularization;  Surgeon: Leonie Man, MD;  Location: [place] [facility];  Service: Cardiovascular;  Laterality: N/A;   CYSTOSCOPY W/ RETROGRADES  10/16/2020   Procedure: CYSTOSCOPY WITH RETROGRADE PYELOGRAM;  Surgeon: Billey Co, MD;  Location: [facility];  Service: Urology;;   CYSTOSCOPY W/ URETERAL STENT PLACEMENT Left 09/20/2020   Procedure: CYSTOSCOPY WITH RETROGRADE PYELOGRAM/URETERAL STENT PLACEMENT;  Surgeon: Janith Lima, MD;  Location: [facility];  Service: Urology;  Laterality: Left;   CYSTOSCOPY/URETEROSCOPY/HOLMIUM LASER/STENT PLACEMENT Left 10/16/2020   Procedure: CYSTOSCOPY/URETEROSCOPY/HOLMIUM LASER/STENT PLACEMENT;  Surgeon: Billey Co, MD;  Location: [facility];  Service: Urology;  Laterality: Left;   ESOPHAGOGASTRODUODENOSCOPY N/A 11/06/2020   Procedure: ESOPHAGOGASTRODUODENOSCOPY (EGD);  Surgeon: Lucilla Lame, MD;  Location: [facility];  Service: Endoscopy;  Laterality: N/A;   LEFT HEART CATH AND CORONARY ANGIOGRAPHY N/A 11/14/2020   Procedure: LEFT HEART CATH AND CORONARY ANGIOGRAPHY;  Surgeon: Leonie Man, MD;  Location: [place] [facility];  Service: Cardiovascular;  Laterality: N/A;   SHOULDER ARTHROSCOPY WITH OPEN ROTATOR CUFF REPAIR Right 08/24/2017   Procedure: SHOULDER ARTHROSCOPY WITH OPEN ROTATOR CUFF REPAIR;  Surgeon: Corky Mull, MD;  Location: [facility];  Service: Orthopedics;  Laterality: Right;   SKIN CANCER EXCISION  12/01/2016   right ear    SKIN CANCER EXCISION     remove from the right side of the face    THROMBECTOMY Right 2004   leg   THYROIDECTOMY  1950   Not sure if total or partial thyroidectomy.    Social History   Socioeconomic History   Marital status: Legally Separated    Spouse name: Not on file   Number of children: Not on file   Years of education: Not on file   Highest education level: Not on file  Occupational History   Not on file  Tobacco Use   Smoking status: Former    Packs/day: 1.00    Years: 46.00    Pack years: 46.00    Types: Cigarettes    Start date: 01/10/1957    Quit date: 02/11/2003    Years since quitting: 18.3    Passive exposure: Past   Smokeless tobacco: Former    Types: Snuff    Quit date: 02/11/2003  Vaping Use   Vaping Use: Never used  Substance and Sexual Activity   Alcohol use: Not Currently    Alcohol/week: 7.0 standard drinks    Types: 7 Cans of beer per week   Drug use: No   Sexual activity: Yes    Partners: Female  Other Topics Concern   Not on file  Social History Narrative   Live with grandaughter, April   Social Determinants of Health   Financial Resource Strain: Not on file  Food Insecurity: Not on file  Transportation  Needs: Not on file  Physical Activity: Not on file  Stress: Not on file  Social Connections: Not on file  Intimate Partner Violence: Not on file    Family History  Problem Relation Age of Onset   Hypertension Mother    Heart disease Mother    CAD Father    Heart attack Father    Allergies  Allergen Reactions   Hydromorphone     Hallucination Pt reports he doesn't know about this   I? Current Outpatient Medications  Medication Sig Dispense Refill   acetaminophen (TYLENOL) 500 MG tablet Take 2 tablets (1,000 mg total) by mouth every 6 (six) hours as needed for mild pain.     albuterol (VENTOLIN HFA) 108 (90 Base) MCG/ACT inhaler Inhale into the lungs every 6 (six) hours as needed for wheezing or shortness of breath.     amLODipine (NORVASC) 5 MG tablet  Take 5 mg by mouth daily.     aspirin EC 81 MG tablet Take 81 mg by mouth daily after supper.     atorvastatin (LIPITOR) 40 MG tablet Take 40 mg by mouth daily after supper.     Budeson-Glycopyrrol-Formoterol (BREZTRI AEROSPHERE) 160-9-4.8 MCG/ACT AERO Inhale 2 puffs into the lungs in the morning and at bedtime. 5.9 g 0   cetirizine (ZYRTEC) 10 MG tablet Take 10 mg by mouth daily.     clopidogrel (PLAVIX) 75 MG tablet Take 75 mg by mouth daily.     digoxin (LANOXIN) 0.125 MG tablet Take 125 mcg by mouth daily.     docusate sodium (COLACE) 100 MG capsule Take 1 capsule (100 mg total) by mouth 2 (two) times daily. 60 capsule 2   famotidine (PEPCID) 20 MG tablet One after supper 30 tablet 11   ferrous gluconate (FERGON) 240 (27 FE) MG tablet Take 240 mg by mouth 3 (three) times daily with meals.     fluticasone (FLONASE) 50 MCG/ACT nasal spray Place 1 spray into both nostrils daily.     isosorbide mononitrate (IMDUR) 30 MG 24 hr tablet Take 0.5 tablets (15 mg total) by mouth daily. 30 tablet 0   lactulose (CHRONULAC) 10 GM/15ML solution Take 30 mLs (20 g total) by mouth 2 (two) times daily as needed for moderate constipation. 236 mL 0    losartan (COZAAR) 100 MG tablet Take 100 mg by mouth daily.     magnesium oxide (MAG-OX) 400 MG tablet Take 400 mg by mouth daily.     metolazone (ZAROXOLYN) 2.5 MG tablet Take 1 tablet (2.5 mg total) by mouth once a week. Take it 1/2 hour before morning furosemide Take 1 extra potassium on the day you take this 4 tablet 5   metoprolol tartrate (LOPRESSOR) 25 MG tablet Take 25 mg by mouth 2 (two) times daily.     montelukast (SINGULAIR) 10 MG tablet Take 10 mg by mouth at bedtime.     nitroGLYCERIN (NITROSTAT) 0.4 MG SL tablet Place under the tongue.     pantoprazole (PROTONIX) 40 MG tablet Take 1 tablet (40 mg total) by mouth 2 (two) times daily. 60 tablet 0   phenazopyridine (PYRIDIUM) 200 MG tablet Take 1 tablet (200 mg total) by mouth 3 (three) times daily as needed (bladder pain). 10 tablet 0   polyethylene glycol (MIRALAX / GLYCOLAX) 17 g packet Take 17 g by mouth daily as needed for mild constipation or moderate constipation. 14 each 0   potassium chloride SA (KLOR-CON M) 20 MEQ tablet Take 1 tablet (20 mEq total) by mouth daily. 30 tablet 5   SPIRIVA RESPIMAT 2.5 MCG/ACT AERS SMARTSIG:2 Puff(s) Via Inhaler Daily     tamsulosin (FLOMAX) 0.4 MG CAPS capsule TAKE 1 CAPSULE BY MOUTH EVERY DAY 14 capsule 0   torsemide (DEMADEX) 20 MG tablet Take 60 mg by mouth daily.     torsemide (DEMADEX) 20 MG tablet Take 3 tablets (60 mg total) by mouth daily. This replaces your furosemide 90 tablet 3   torsemide (DEMADEX) 20 MG tablet Take 3 tablets (60 mg total) by mouth daily. 270 tablet 3   TRELEGY ELLIPTA 200-62.5-25 MCG/ACT AEPB Inhale 1 puff into the lungs daily.     amoxicillin (AMOXIL) 500 MG tablet Take 500 mg by mouth every 8 (eight) hours.     No current facility-administered medications for this visit.     Abtx:  Anti-infectives (From admission, onward)    None  REVIEW OF SYSTEMS:  Const: negative fever, negative chills, negative weight loss Eyes: negative diplopia or visual  changes, negative eye pain ENT: negative coryza, negative sore throat Resp:  cough, , dyspnea on nsal oxygen Cards: negative for chest pain, palpitations, lower extremity edema GU: ++ frequency, difficulty in passing urine no dysuria and hematuria GI: Negative for abdominal pain, diarrhea, bleeding, constipation Skin: negative for rash and pruritus Heme: negative for easy bruising and gum/nose bleeding MS: general fatigue  Neurolo:negative for headaches, dizziness, vertigo, memory problems  Psych: negative for feelings of anxiety, depression  Endocrine: negative for thyroid, diabetes Allergy/Immunology- negative for any medication or food allergies ? Pertinent Positives include : Objective:  VITALS:  BP 101/63   Pulse 77   Temp 98.4 F (36.9 C) (Temporal)   PHYSICAL EXAM:  General: Alert, cooperative, some respiratory distress on nasal cannula oxygen Head: Normocephalic, without obvious abnormality, atraumatic. Eyes: Conjunctivae clear, anicteric sclerae. Pupils are equal ENT Nares normal. No drainage or sinus tenderness. Edentulous Neck: Supple, symmetrical, no adenopathy, thyroid: non tender no carotid bruit and no JVD. Back: No CVA tenderness. Lungs: Bilateral air entry.   Heart: Irregular well controlled Abdomen: Not examined as he is in the wheelchair  extremities: atraumatic, no cyanosis. No edema. No clubbing Skin: No rashes or lesions. Or bruising Lymph: Cervical, supraclavicular normal. Neurologic: Grossly non-focal Pertinent Labs Creatinine on 04/19/2021 is 1.25  Microbiology: 05/20/2021 urine culture ESBL E. coli 03/04/2021 urine culture ESBL E. coli '11 8 2 '$ urine culture ESBL E. coli.  ? Impression/Recommendation 81 year old f male presenting with LUTS. Increased frequency and difficulty passing urine BPH on Flomax ESBL E. coli in the urine culture since November 2022. Had received p.o. nitrofurantoin and Fosamax with minimal relief He does not have any  systemic illness He has history of renal stone and stent placement and removal of stent in the past 6 months Because of persistent LUTS we will treat him with IV or ertapenem for 7 days.  Patient lives in a trailer.  His wife is not well.  So he it would be better for him to get daily IV antibiotic at the day surgery.    CAD status post stent on Plavix aspirin  COPD on home oxygen A-fib  ___________________________________________________ Discussed with patient in detail.   Discussed with case surgery. They will contact him for starting IV antibiotics through a peripheral line Note:  This document was prepared using Dragon voice recognition software and may include unintentional dictation errors.

## 2021-06-03 NOTE — Patient Instructions (Signed)
You have ESBl e.coli in the urine since 3 months and you have symptoms of frequency and difficulty passing urine. Will arrange for daily iv antibiotic at the day surgery in [facility] for 7 days- will let you know of the start date

## 2021-06-04 DIAGNOSIS — J449 Chronic obstructive pulmonary disease, unspecified: Secondary | ICD-10-CM | POA: Diagnosis not present

## 2021-06-07 ENCOUNTER — Inpatient Hospital Stay: Admission: RE | Admit: 2021-06-07 | Payer: Medicare HMO | Source: Ambulatory Visit

## 2021-06-07 ENCOUNTER — Other Ambulatory Visit: Payer: Self-pay | Admitting: Infectious Diseases

## 2021-06-07 DIAGNOSIS — A498 Other bacterial infections of unspecified site: Secondary | ICD-10-CM

## 2021-06-08 ENCOUNTER — Ambulatory Visit (INDEPENDENT_AMBULATORY_CARE_PROVIDER_SITE_OTHER): Payer: Medicare HMO | Admitting: Internal Medicine

## 2021-06-08 ENCOUNTER — Ambulatory Visit
Admission: RE | Admit: 2021-06-08 | Discharge: 2021-06-08 | Disposition: A | Discharge status: Home / Self Care | Payer: Medicare HMO | Source: Ambulatory Visit | Attending: Family | Admitting: Family

## 2021-06-08 ENCOUNTER — Encounter: Payer: Self-pay | Admitting: Internal Medicine

## 2021-06-08 VITALS — BP 109/67 | HR 73 | Temp 97.8°F | Resp 18 | Ht 74.0 in | Wt 178.0 lb

## 2021-06-08 VITALS — BP 110/60 | HR 71 | Temp 97.7°F | Ht 74.0 in | Wt 178.4 lb

## 2021-06-08 DIAGNOSIS — A499 Bacterial infection, unspecified: Secondary | ICD-10-CM | POA: Diagnosis not present

## 2021-06-08 DIAGNOSIS — R0689 Other abnormalities of breathing: Secondary | ICD-10-CM | POA: Diagnosis not present

## 2021-06-08 DIAGNOSIS — J449 Chronic obstructive pulmonary disease, unspecified: Secondary | ICD-10-CM

## 2021-06-08 DIAGNOSIS — I519 Heart disease, unspecified: Secondary | ICD-10-CM | POA: Diagnosis not present

## 2021-06-08 DIAGNOSIS — Z1612 Extended spectrum beta lactamase (ESBL) resistance: Secondary | ICD-10-CM | POA: Insufficient documentation

## 2021-06-08 DIAGNOSIS — J9611 Chronic respiratory failure with hypoxia: Secondary | ICD-10-CM | POA: Diagnosis not present

## 2021-06-08 DIAGNOSIS — J440 Chronic obstructive pulmonary disease with acute lower respiratory infection: Secondary | ICD-10-CM

## 2021-06-08 MED ORDER — ROFLUMILAST 500 MCG PO TABS: 500.0000 ug | ORAL_TABLET | Freq: Every day | ORAL | 6 refills | Status: DC

## 2021-06-08 MED ORDER — SODIUM CHLORIDE 0.9 % IV SOLN
1.0000 g | INTRAVENOUS | Status: DC
  Administered 2021-06-08: 1000 mg via INTRAVENOUS
  Filled 2021-06-08 (×2): qty 1

## 2021-06-08 NOTE — Patient Instructions (Addendum)
Continue OXYGEN AS PRESCRIBED  CONTINUE BREZTRI AS PRESCRIBED  START  DALIRESP 500 MG ONCE PER DAY FOR CHEST CONGESTION AND COUGH

## 2021-06-08 NOTE — Progress Notes (Signed)
[name] Browning, male    DOB: 12-25-1940,  MRN: [id number]   SYNOPSIS Baseline able to do yardwork pushing weed eating s needing any kind on inhaler but maybe once or twice a week needed inhaler then admitted with covid Jan 2021 and d/c did not need 02 but then readmitted on 03/25/19 with CAP and worse  then  last admit for abdominal pain:    PREVIOUS HISTORY HOSPITALIZATION 11/2020 81 year old male with history of CAD (nonobstructive calcified coronary disease), atrial fibrillation on Eliquis, HFpEF, hypertension, TIA in 2016, COPD on 2 L who was admitted to the hospital with shortness of breath.  Cardiology is consulted for evaluation of an elevated troponin. Since he was discharged on 10/28, and his medications were changed at discharge. His metoprolol XL 25 mg was stopped, though they restarted this because his HR was high at home. His lasix was also stopped. He has been having increased shortness of breath, and increased LE edema. He was having some black colored stools as well.    PREVIOUS ADMISSIONS First admission was from 10/20/2020 to 10/31/2020 when he was admitted with respiratory failure due to COPD and pneumonia, as well as an obstructive left renal stone.  He developed hypotension and worsening respiratory distress requiring pressors and BiPAP.     Blood cultures grew positive for Enterococcus faecalis and staph hemolyticus.  He was treated with antibiotics ending 11/03/2020.  The patient was recently admitted to the hospital from 10/25-10/28 with complaints of dysphagia.     He was seen by gastroenterology who underwent an EGD with no significant findings.  Since that time he has had progressive worsening shortness of breath, eventually presented to the emergency department where he was found to have an oxygen saturation of 88% on his home 2 L of oxygen.  His labs are otherwise notable for a troponin of 620, BNP of 220, hemoglobin of 8.2 (9.7 on 11/06/2020).  Chest x-ray shows diffuse  bilateral infiltrates which could be consistent with edema but some overlying scar.   Pertinent  Medical History  COPD Ischemic Cardiomyopathy   Significant Hospital Events: Including procedures, antibiotic start and stop dates in addition to other pertinent events   11/5 acute SOB and DOE on biPAP CODE STEMI 11/5 s/p CATH chronic RCA occlusion, demand ischemia causing MI 11/6 remains in cardiogenic shock, severe hypoxia 11/7 remains on low-dose levophed and continuous heparin infusion 11/8 off pressors this am but restarted (levophed switched to dopamine) after patient became hypotensive after given metoprolol, off heparin gtt   CC Follow up COPD  HPI Significant shortness of breath and dyspnea on exertion Has chronic productive cough and chest congestion over the last 8 months Several hospitalizations over the last 1 year   No exacerbation at this time No evidence of heart failure at this time No evidence or signs of infection at this time No respiratory distress No fevers, chills, nausea, vomiting, diarrhea No evidence of lower extremity edema No evidence hemoptysis  Patient has progressive respiratory disease  dyspnea on exertion  Severe chronic hypoxic respiratory failure Currently on 5 L nasal cannula oxygen therapy  Past Medical History:  Diagnosis Date   Anginal pain ([place])    Anxiety    Aortic atherosclerosis ([facility])    Atrial fibrillation ([place]) 01/2019   Bilateral carpal tunnel syndrome    CAD (coronary artery disease)    a.) LHC 2004 --> normal coronaries. b.) normal stress test in 2007 and 2011; c.) Lexiscan 05/20/2014 --> LVEF 55-65%; no  significant stress induced ischemia/arrythmia. d.) CT chest 03/25/2019 --> coronaries carcified.   Carotid atherosclerosis, bilateral    Carpal tunnel syndrome, bilateral    CHF (congestive heart failure) (HCC)    Chronic anticoagulation    a.) ASA + apixaban   COPD (chronic obstructive pulmonary disease) (HCC)    CVA  (cerebral vascular accident) ([place])    Degenerative disc disease, cervical    Diastolic dysfunction    a.) TTE 05/29/2014 --> LVEF 60-65%; G1DD.   DVT (deep venous thrombosis) (HCC)    GERD (gastroesophageal reflux disease)    History of 2019 novel coronavirus disease (COVID-19) 02/08/2019   HLD (hyperlipidemia)    Hypertension    Kidney stones    Osteoarthritis of right shoulder    Pneumonia    Respiratory failure, acute ([place]) 09/20/2020   a.) severe respiratory distress 1 hour after urological surgery. CXR (+) for acute pulmonary edema. Transferred to ICU and placed on NIPPV. Questionable aspiration PNA. (+) A.fib with RVR. Improved with ABX, diuresis, and amiodarone.   Skin cancer of face    a.) RIGHT ear and RIGHT forehead; excised.   Syncope    TIA (transient ischemic attack) 2016   Valvular regurgitation    a.) TTE 05/26/2014 --> LVEF 60-65%; trivial MR, mild TR; no AR or PR. b.) TTE 04/20/2015 --> LVEF 55-60%; trivial MR and PR; no AR or TR.    Outpatient Medications Prior to Visit  Medication Sig Dispense Refill   acetaminophen (TYLENOL) 500 MG tablet Take 2 tablets (1,000 mg total) by mouth every 6 (six) hours as needed for mild pain.     amLODipine (NORVASC) 5 MG tablet Take 1 tablet (5 mg total) by mouth daily after supper. 30 tablet 2   apixaban (ELIQUIS) 5 MG TABS tablet Take 1 tablet (5 mg total) by mouth 2 (two) times daily. 60 tablet 2   aspirin EC 81 MG tablet Take 81 mg by mouth daily after supper.     atorvastatin (LIPITOR) 40 MG tablet Take 40 mg by mouth daily after supper.     Ferrous Gluconate (IRON) 240 (27 Fe) MG TABS Take 27 mg by mouth daily after supper.     furosemide (LASIX) 20 MG tablet Take 1 tablet (20 mg total) by mouth daily after breakfast. 30 tablet 2   guaiFENesin-dextromethorphan (ROBITUSSIN DM) 100-10 MG/5ML syrup Take 5 mLs by mouth every 4 (four) hours as needed for cough. 118 mL 0   losartan (COZAAR) 100 MG tablet Take 100 mg by mouth daily  after supper.     montelukast (SINGULAIR) 10 MG tablet Take 10 mg by mouth at bedtime.     pantoprazole (PROTONIX) 40 MG tablet Take 40 mg by mouth daily after supper.     phenazopyridine (PYRIDIUM) 200 MG tablet Take 1 tablet (200 mg total) by mouth 3 (three) times daily as needed (bladder pain). 10 tablet 0   polyethylene glycol (MIRALAX / GLYCOLAX) 17 g packet Take 17 g by mouth daily as needed for mild constipation or moderate constipation. (Patient taking differently: Take 17 g by mouth daily.) 14 each 0                  No facility-administered medications prior to visit.       Review of Systems: Gen:  Denies  fever, sweats, chills weight loss  HEENT: Denies blurred vision, double vision, ear pain, eye pain, hearing loss, nose bleeds, sore throat Cardiac:  No dizziness, chest pain or heaviness, chest  tightness,edema, No JVD Resp: +cough, +sputum production, +shortness of breath,+wheezing, -hemoptysis,   Other:  All other systems negative  BP 110/60 (BP Location: Left Arm, Cuff Size: Normal)   Pulse 71   Temp 97.7 F (36.5 C) (Temporal)   Ht '6\' 2"'$  (1.88 m)   Wt 178 lb 6.4 oz (80.9 kg)   SpO2 92%   BMI 22.91 kg/m   Physical Examination:   General Appearance: looks chronically ill  EYES PERRLA, EOM intact.   NECK Supple, No JVD Pulmonary: normal breath sounds, No wheezing.  CardiovascularNormal S1,S2.  No m/r/g.   ALL OTHER ROS ARE NEGATIVE     Labs ordered/ reviewed:      Chemistry      Component Value Date/Time   NA 141 04/19/2021 2025   NA 143 06/01/2015 1049   NA 144 08/12/2013 0546   K 4.5 04/19/2021 2025   K 4.5 08/12/2013 0546   CL 105 04/19/2021 2025   CL 113 (H) 08/12/2013 0546   CO2 27 04/19/2021 2025   CO2 26 08/12/2013 0546   BUN 20 04/19/2021 2025   BUN 16 06/01/2015 1049   BUN 25 (H) 08/12/2013 0546   CREATININE 1.25 (H) 04/19/2021 2025   CREATININE 1.00 06/08/2016 1044      Component Value Date/Time   CALCIUM 9.1 04/19/2021 2025    CALCIUM 8.3 (L) 08/12/2013 0546   ALKPHOS 90 11/14/2020 0710   ALKPHOS 112 08/10/2013 1703   AST 156 (H) 11/14/2020 0710   AST 27 08/10/2013 1703   ALT 170 (H) 11/14/2020 0710   ALT 22 08/10/2013 1703   BILITOT 1.6 (H) 11/14/2020 0710   BILITOT 0.5 06/01/2015 1049   BILITOT 0.7 08/10/2013 1703            Assessment   81 year old white male with COPD with several bouts of COVID-19 infection over the last 8 to 12 months with progressive respiratory insufficiency and progressive hypoxic respiratory failure with grade 1 diastolic dysfunction    COPD has progressed to end-stage with previous infections and hospitalizations Patient was started on Breztri inhaler previous office visit back in February 2023 Patient is intolerant of albuterol Patient was given prednisone 20 mg daily at last OV  Chronic hypoxic respiratory failure Continue oxygen as prescribed Patient uses and benefits from oxygen therapy He needs this for survival  Respiratory insufficiency Patient may need alternative therapy with nebulized medication  COPD chronic bronchitis with emphysema We will start Daliresp (ROFLUMALIST) and assess respiratory symptoms   MEDICATION ADJUSTMENTS/LABS AND TESTS ORDERED: CONTINUE BREZTRI CONTINUE OXYGEN AS PRESCRIBED START DALIRESP RECOMMEND PULMONARY REHAB   CURRENT MEDICATIONS REVIEWED AT LENGTH WITH PATIENT TODAY   Patient  satisfied with Plan of action and management. All questions answered  Follow up  6 months  Total Time Spent  25 mins   Maretta Bees Patricia Pesa, M.D.  [facility]  Medical Director [place] Director [facility] Cardio-Pulmonary Department

## 2021-06-09 ENCOUNTER — Ambulatory Visit
Admission: RE | Admit: 2021-06-09 | Discharge: 2021-06-09 | Disposition: A | Discharge status: Home / Self Care | Payer: Medicare HMO | Source: Ambulatory Visit | Attending: Family | Admitting: Family

## 2021-06-09 DIAGNOSIS — Z1612 Extended spectrum beta lactamase (ESBL) resistance: Secondary | ICD-10-CM | POA: Diagnosis not present

## 2021-06-09 DIAGNOSIS — N390 Urinary tract infection, site not specified: Secondary | ICD-10-CM | POA: Diagnosis not present

## 2021-06-09 DIAGNOSIS — A498 Other bacterial infections of unspecified site: Secondary | ICD-10-CM | POA: Diagnosis not present

## 2021-06-09 DIAGNOSIS — N401 Enlarged prostate with lower urinary tract symptoms: Secondary | ICD-10-CM | POA: Diagnosis not present

## 2021-06-09 MED ORDER — SODIUM CHLORIDE 0.9 % IV SOLN
1.0000 g | Freq: Once | INTRAVENOUS | Status: AC
  Administered 2021-06-09: 1000 mg via INTRAVENOUS
  Filled 2021-06-09: qty 1

## 2021-06-10 ENCOUNTER — Ambulatory Visit
Admission: RE | Admit: 2021-06-10 | Discharge: 2021-06-10 | Disposition: A | Discharge status: Home / Self Care | Payer: Medicare HMO | Source: Ambulatory Visit | Attending: Family | Admitting: Family

## 2021-06-10 DIAGNOSIS — A499 Bacterial infection, unspecified: Secondary | ICD-10-CM | POA: Diagnosis not present

## 2021-06-10 DIAGNOSIS — Z1612 Extended spectrum beta lactamase (ESBL) resistance: Secondary | ICD-10-CM | POA: Diagnosis not present

## 2021-06-10 MED ORDER — SODIUM CHLORIDE 0.9 % IV SOLN
1.0000 g | Freq: Once | INTRAVENOUS | Status: AC
  Administered 2021-06-10: 1000 mg via INTRAVENOUS
  Filled 2021-06-10: qty 1

## 2021-06-11 ENCOUNTER — Ambulatory Visit
Admission: RE | Admit: 2021-06-11 | Discharge: 2021-06-11 | Disposition: A | Discharge status: Home / Self Care | Payer: Medicare HMO | Source: Ambulatory Visit | Attending: Infectious Diseases | Admitting: Infectious Diseases

## 2021-06-11 DIAGNOSIS — Z1612 Extended spectrum beta lactamase (ESBL) resistance: Secondary | ICD-10-CM | POA: Diagnosis not present

## 2021-06-11 DIAGNOSIS — A499 Bacterial infection, unspecified: Secondary | ICD-10-CM

## 2021-06-11 LAB — CBC
HCT: 33.8 % — ABNORMAL LOW (ref 39.0–52.0)
Hemoglobin: 10.6 g/dL — ABNORMAL LOW (ref 13.0–17.0)
MCH: 27.7 pg (ref 26.0–34.0)
MCHC: 31.4 g/dL (ref 30.0–36.0)
MCV: 88.5 fL (ref 80.0–100.0)
Platelets: 242 10*3/uL (ref 150–400)
RBC: 3.82 MIL/uL — ABNORMAL LOW (ref 4.22–5.81)
RDW: 14.5 % (ref 11.5–15.5)
WBC: 8.1 10*3/uL (ref 4.0–10.5)
nRBC: 0 % (ref 0.0–0.2)

## 2021-06-11 LAB — BASIC METABOLIC PANEL
Anion gap: 10 (ref 5–15)
BUN: 22 mg/dL (ref 8–23)
CO2: 29 mmol/L (ref 22–32)
Calcium: 9.6 mg/dL (ref 8.9–10.3)
Chloride: 100 mmol/L (ref 98–111)
Creatinine, Ser: 1.35 mg/dL — ABNORMAL HIGH (ref 0.61–1.24)
GFR, Estimated: 53 mL/min — ABNORMAL LOW (ref >60)
Glucose, Bld: 107 mg/dL — ABNORMAL HIGH (ref 70–99)
Potassium: 3.8 mmol/L (ref 3.5–5.1)
Sodium: 139 mmol/L (ref 135–145)

## 2021-06-11 MED ORDER — SODIUM CHLORIDE 0.9 % IV SOLN
1.0000 g | Freq: Once | INTRAVENOUS | Status: AC
  Administered 2021-06-11: 1000 mg via INTRAVENOUS
  Filled 2021-06-11: qty 1

## 2021-06-12 ENCOUNTER — Ambulatory Visit
Admission: RE | Admit: 2021-06-12 | Discharge: 2021-06-12 | Disposition: A | Discharge status: Home / Self Care | Payer: Medicare HMO | Source: Ambulatory Visit | Attending: Infectious Diseases | Admitting: Infectious Diseases

## 2021-06-12 ENCOUNTER — Emergency Department
Admission: EM | Admit: 2021-06-12 | Discharge status: Absent without leave | Payer: Medicare HMO | Source: Home / Self Care

## 2021-06-12 DIAGNOSIS — N390 Urinary tract infection, site not specified: Secondary | ICD-10-CM | POA: Insufficient documentation

## 2021-06-12 DIAGNOSIS — Z1612 Extended spectrum beta lactamase (ESBL) resistance: Secondary | ICD-10-CM | POA: Insufficient documentation

## 2021-06-12 DIAGNOSIS — A498 Other bacterial infections of unspecified site: Secondary | ICD-10-CM | POA: Insufficient documentation

## 2021-06-12 DIAGNOSIS — N401 Enlarged prostate with lower urinary tract symptoms: Secondary | ICD-10-CM | POA: Insufficient documentation

## 2021-06-12 MED ORDER — SODIUM CHLORIDE 0.9 % IV SOLN
1.0000 g | Freq: Once | INTRAVENOUS | Status: AC
  Administered 2021-06-12: 1000 mg via INTRAVENOUS
  Filled 2021-06-12: qty 1

## 2021-06-12 NOTE — Progress Notes (Signed)
Medical Day note: Pt rec'd to PACU for Invanz infusion. IV started, medication verified, vital signs stable, pt cont on home O2.  Tolerated procedure well; will return tomorrow for treatment #6 / 7.

## 2021-06-13 ENCOUNTER — Ambulatory Visit
Admission: RE | Admit: 2021-06-13 | Discharge: 2021-06-13 | Disposition: A | Discharge status: Home / Self Care | Payer: Medicare HMO | Source: Ambulatory Visit | Attending: Infectious Diseases | Admitting: Infectious Diseases

## 2021-06-13 DIAGNOSIS — N390 Urinary tract infection, site not specified: Secondary | ICD-10-CM | POA: Insufficient documentation

## 2021-06-13 MED ORDER — SODIUM CHLORIDE 0.9 % IV SOLN
1.0000 g | Freq: Once | INTRAVENOUS | Status: AC
  Administered 2021-06-13: 1000 mg via INTRAVENOUS
  Filled 2021-06-13: qty 1

## 2021-06-14 ENCOUNTER — Ambulatory Visit
Admission: RE | Admit: 2021-06-14 | Discharge: 2021-06-14 | Disposition: A | Discharge status: Home / Self Care | Payer: Medicare HMO | Source: Ambulatory Visit | Attending: Family | Admitting: Family

## 2021-06-14 DIAGNOSIS — A498 Other bacterial infections of unspecified site: Secondary | ICD-10-CM | POA: Insufficient documentation

## 2021-06-14 DIAGNOSIS — N401 Enlarged prostate with lower urinary tract symptoms: Secondary | ICD-10-CM | POA: Insufficient documentation

## 2021-06-14 DIAGNOSIS — J479 Bronchiectasis, uncomplicated: Secondary | ICD-10-CM | POA: Diagnosis not present

## 2021-06-14 DIAGNOSIS — Z1612 Extended spectrum beta lactamase (ESBL) resistance: Secondary | ICD-10-CM | POA: Diagnosis not present

## 2021-06-14 DIAGNOSIS — N390 Urinary tract infection, site not specified: Secondary | ICD-10-CM | POA: Diagnosis not present

## 2021-06-14 MED ORDER — SODIUM CHLORIDE 0.9 % IV SOLN
1.0000 g | Freq: Once | INTRAVENOUS | Status: AC
  Administered 2021-06-14: 1000 mg via INTRAVENOUS
  Filled 2021-06-14: qty 1

## 2021-06-15 ENCOUNTER — Ambulatory Visit: Payer: Medicare HMO

## 2021-06-16 ENCOUNTER — Ambulatory Visit: Payer: Medicare HMO

## 2021-06-18 ENCOUNTER — Ambulatory Visit: Payer: Medicare HMO

## 2021-06-29 DIAGNOSIS — J449 Chronic obstructive pulmonary disease, unspecified: Secondary | ICD-10-CM | POA: Diagnosis not present

## 2021-06-29 DIAGNOSIS — I10 Essential (primary) hypertension: Secondary | ICD-10-CM | POA: Diagnosis not present

## 2021-06-29 DIAGNOSIS — I2510 Atherosclerotic heart disease of native coronary artery without angina pectoris: Secondary | ICD-10-CM | POA: Diagnosis not present

## 2021-06-29 DIAGNOSIS — I4891 Unspecified atrial fibrillation: Secondary | ICD-10-CM | POA: Diagnosis not present

## 2021-06-29 DIAGNOSIS — Z9861 Coronary angioplasty status: Secondary | ICD-10-CM | POA: Diagnosis not present

## 2021-06-29 DIAGNOSIS — I340 Nonrheumatic mitral (valve) insufficiency: Secondary | ICD-10-CM | POA: Diagnosis not present

## 2021-06-29 DIAGNOSIS — K219 Gastro-esophageal reflux disease without esophagitis: Secondary | ICD-10-CM | POA: Diagnosis not present

## 2021-06-29 DIAGNOSIS — E782 Mixed hyperlipidemia: Secondary | ICD-10-CM | POA: Diagnosis not present

## 2021-07-05 DIAGNOSIS — J449 Chronic obstructive pulmonary disease, unspecified: Secondary | ICD-10-CM | POA: Diagnosis not present

## 2021-07-06 ENCOUNTER — Ambulatory Visit: Payer: Medicare HMO | Admitting: Family

## 2021-07-08 ENCOUNTER — Telehealth: Payer: Self-pay | Admitting: Urology

## 2021-07-08 NOTE — Telephone Encounter (Signed)
Called pt, he states that he has completed 7 IV treatments but is still having dysuria, has to sit to void, and has a hard time starting urinary stream. He questions what the next steps are. Please advise.

## 2021-07-08 NOTE — Telephone Encounter (Signed)
Pt LMOM and said IV meds we gave him are not working.

## 2021-07-09 ENCOUNTER — Other Ambulatory Visit: Payer: Self-pay | Admitting: Internal Medicine

## 2021-07-09 NOTE — Telephone Encounter (Signed)
He was on IV antibiotics by infectious disease, so he may need to discuss with them if they need a longer course.  He has follow-up with [place] next week and we can discuss further and see how he is doing at that time   Nickolas Madrid, MD  07/09/2021

## 2021-07-12 ENCOUNTER — Encounter: Payer: Self-pay | Admitting: Physician Assistant

## 2021-07-12 ENCOUNTER — Ambulatory Visit (INDEPENDENT_AMBULATORY_CARE_PROVIDER_SITE_OTHER): Payer: Medicare HMO | Admitting: Physician Assistant

## 2021-07-12 VITALS — BP 91/51 | HR 90 | Temp 97.7°F | Ht 74.0 in | Wt 180.0 lb

## 2021-07-12 DIAGNOSIS — N390 Urinary tract infection, site not specified: Secondary | ICD-10-CM | POA: Diagnosis not present

## 2021-07-12 LAB — URINALYSIS, COMPLETE
Bilirubin, UA: NEGATIVE
Ketones, UA: NEGATIVE
Nitrite, UA: POSITIVE — AB
Specific Gravity, UA: 1.015 (ref 1.005–1.030)
Urobilinogen, Ur: 1 mg/dL (ref 0.2–1.0)
pH, UA: 7 (ref 5.0–7.5)

## 2021-07-12 LAB — MICROSCOPIC EXAMINATION: WBC, UA: >30 /hpf — AB (ref 0–5)

## 2021-07-12 LAB — BLADDER SCAN AMB NON-IMAGING

## 2021-07-12 NOTE — Progress Notes (Signed)
07/12/2021 2:05 PM   [name] Browning [birth year] [id number]  CC: Chief Complaint  Patient presents with   Recurrent UTI    Difficulty emptying   HPI: [name] Browning is a 81 y.o. comorbid male with a urologic history of nephrolithiasis and more recently recurrent MDR E. coli UTIs requiring 7 days of IV ertapenem per Dr. Delaine Lame who presents today for evaluation of possible recurrent UTI.   Today he reports he completed IV ertapenem approximately 1 month ago.  He states his urinary symptoms never improved on the medication.  He continues to report dysuria, hesitancy/straining, and today also reports some soreness at the head of his penis and low back soreness.  He denies fever, chills, nausea, or vomiting.  He has been taking Azo at home to help with his symptoms, so he is unsure if he has had any gross hematuria.  In-office UA today positive for trace glucose, trace intact blood, 1+ protein, nitrites, and 3+ leukocyte esterase; urine microscopy with >30 WBCs/HPF, 3-10 RBCs/HPF, and any bacteria. PVR 53m.  PMH: Past Medical History:  Diagnosis Date   Anginal pain ([facility]    Anxiety    Aortic atherosclerosis ([place]    Atrial fibrillation ([facility] 01/2019   Bilateral carpal tunnel syndrome    CAD (coronary artery disease)    a.) LHC 2004 --> normal coronaries. b.) normal stress test in 2007 and 2011; c.) Lexiscan 05/20/2014 --> LVEF 55-65%; no significant stress induced ischemia/arrythmia. d.) CT chest 03/25/2019 --> coronaries carcified.   Carotid atherosclerosis, bilateral    Carpal tunnel syndrome, bilateral    CHF (congestive heart failure) (HCC)    Chronic anticoagulation    a.) ASA + apixaban   COPD (chronic obstructive pulmonary disease) (HCC)    CVA (cerebral vascular accident) ([facility]    Degenerative disc disease, cervical    Diastolic dysfunction    a.) TTE 05/29/2014 --> LVEF 60-65%; G1DD.   DVT (deep venous thrombosis) (HCC)    GERD (gastroesophageal reflux disease)     History of 2019 novel coronavirus disease (COVID-19) 02/08/2019   HLD (hyperlipidemia)    Hypertension    Kidney stones    Osteoarthritis of right shoulder    Pneumonia    Respiratory failure, acute ([place] 09/20/2020   a.) severe respiratory distress 1 hour after urological surgery. CXR (+) for acute pulmonary edema. Transferred to ICU and placed on NIPPV. Questionable aspiration PNA. (+) A.fib with RVR. Improved with ABX, diuresis, and amiodarone.   Skin cancer of face    a.) RIGHT ear and RIGHT forehead; excised.   Syncope    TIA (transient ischemic attack) 2016   Valvular regurgitation    a.) TTE 05/26/2014 --> LVEF 60-65%; trivial MR, mild TR; no AR or PR. b.) TTE 04/20/2015 --> LVEF 55-60%; trivial MR and PR; no AR or TR.    Surgical History: Past Surgical History:  Procedure Laterality Date   CARDIAC CATHETERIZATION  2004   CARPAL TUNNEL RELEASE Right 06/11/2013   CENTRAL LINE INSERTION  11/14/2020   Procedure: CENTRAL LINE INSERTION;  Surgeon: HLeonie Man MD;  [facility];  Service: Cardiovascular;;   CORONARY/GRAFT ACUTE MI REVASCULARIZATION N/A 11/14/2020   Procedure: Coronary/Graft Acute MI Revascularization;  Surgeon: HLeonie Man MD;  [facility];  Service: Cardiovascular;  Laterality: N/A;   CYSTOSCOPY W/ RETROGRADES  10/16/2020   Procedure: CYSTOSCOPY WITH RETROGRADE PYELOGRAM;  Surgeon: SBilley Co MD;  Location: [facility];  Service: Urology;;  CYSTOSCOPY W/ URETERAL STENT PLACEMENT Left 09/20/2020   Procedure: CYSTOSCOPY WITH RETROGRADE PYELOGRAM/URETERAL STENT PLACEMENT;  Surgeon: Janith Lima, MD;  Location: [facility];  Service: Urology;  Laterality: Left;   CYSTOSCOPY/URETEROSCOPY/HOLMIUM LASER/STENT PLACEMENT Left 10/16/2020   Procedure: CYSTOSCOPY/URETEROSCOPY/HOLMIUM LASER/STENT PLACEMENT;  Surgeon: Billey Co, MD;  Location: [facility];  Service: Urology;  Laterality: Left;   ESOPHAGOGASTRODUODENOSCOPY N/A  11/06/2020   Procedure: ESOPHAGOGASTRODUODENOSCOPY (EGD);  Surgeon: Lucilla Lame, MD;  [facility];  Service: Endoscopy;  Laterality: N/A;   LEFT HEART CATH AND CORONARY ANGIOGRAPHY N/A 11/14/2020   Procedure: LEFT HEART CATH AND CORONARY ANGIOGRAPHY;  Surgeon: Leonie Man, MD;  Location: [facility];  Service: Cardiovascular;  Laterality: N/A;   SHOULDER ARTHROSCOPY WITH OPEN ROTATOR CUFF REPAIR Right 08/24/2017   Procedure: SHOULDER ARTHROSCOPY WITH OPEN ROTATOR CUFF REPAIR;  Surgeon: Corky Mull, MD;  Location: [facility];  Service: Orthopedics;  Laterality: Right;   SKIN CANCER EXCISION  12/01/2016   right ear    SKIN CANCER EXCISION     remove from the right side of the face    THROMBECTOMY Right 2004   leg   THYROIDECTOMY  1950   Not sure if total or partial thyroidectomy.    Home Medications:  Allergies as of 07/12/2021       Reactions   Hydromorphone    Hallucination Pt reports he doesn't know about this        Medication List        Accurate as of July 12, 2021  2:05 PM. If you have any questions, ask your nurse or doctor.          acetaminophen 500 MG tablet Commonly known as: TYLENOL Take 2 tablets (1,000 mg total) by mouth every 6 (six) hours as needed for mild pain.   albuterol 108 (90 Base) MCG/ACT inhaler Commonly known as: VENTOLIN HFA Inhale into the lungs every 6 (six) hours as needed for wheezing or shortness of breath.   amLODipine 5 MG tablet Commonly known as: NORVASC Take 5 mg by mouth daily.   aspirin EC 81 MG tablet Take 81 mg by mouth daily after supper.   atorvastatin 40 MG tablet Commonly known as: LIPITOR Take 40 mg by mouth daily after supper.   Breztri Aerosphere 160-9-4.8 MCG/ACT Aero Generic drug: Budeson-Glycopyrrol-Formoterol Inhale 2 puffs into the lungs in the morning and at bedtime.   cetirizine 10 MG tablet Commonly known as: ZYRTEC Take 10 mg by mouth daily.   clopidogrel 75 MG  tablet Commonly known as: PLAVIX Take 75 mg by mouth daily.   digoxin 0.125 MG tablet Commonly known as: LANOXIN Take 125 mcg by mouth daily.   docusate sodium 100 MG capsule Commonly known as: Colace Take 1 capsule (100 mg total) by mouth 2 (two) times daily.   famotidine 20 MG tablet Commonly known as: Pepcid One after supper   ferrous gluconate 240 (27 FE) MG tablet Commonly known as: FERGON Take 240 mg by mouth 3 (three) times daily with meals.   fluticasone 50 MCG/ACT nasal spray Commonly known as: FLONASE Place 1 spray into both nostrils daily.   isosorbide mononitrate 30 MG 24 hr tablet Commonly known as: IMDUR Take 0.5 tablets (15 mg total) by mouth daily.   lactulose 10 GM/15ML solution Commonly known as: CHRONULAC Take 30 mLs (20 g total) by mouth 2 (two) times daily as needed for moderate constipation.   losartan 100 MG tablet Commonly known as: COZAAR Take  100 mg by mouth daily.   magnesium oxide 400 MG tablet Commonly known as: MAG-OX Take 400 mg by mouth daily.   metolazone 2.5 MG tablet Commonly known as: ZAROXOLYN Take 1 tablet (2.5 mg total) by mouth once a week. Take it 1/2 hour before morning furosemide Take 1 extra potassium on the day you take this   metoprolol tartrate 25 MG tablet Commonly known as: LOPRESSOR Take 25 mg by mouth 2 (two) times daily.   montelukast 10 MG tablet Commonly known as: SINGULAIR Take 10 mg by mouth at bedtime.   nitroGLYCERIN 0.4 MG SL tablet Commonly known as: NITROSTAT Place under the tongue.   pantoprazole 40 MG tablet Commonly known as: PROTONIX Take 1 tablet (40 mg total) by mouth 2 (two) times daily.   phenazopyridine 200 MG tablet Commonly known as: PYRIDIUM Take 1 tablet (200 mg total) by mouth 3 (three) times daily as needed (bladder pain).   polyethylene glycol 17 g packet Commonly known as: MIRALAX / GLYCOLAX Take 17 g by mouth daily as needed for mild constipation or moderate constipation.    potassium chloride SA 20 MEQ tablet Commonly known as: KLOR-CON M Take 1 tablet (20 mEq total) by mouth daily.   roflumilast 500 MCG Tabs tablet Commonly known as: Daliresp Take 1 tablet (500 mcg total) by mouth daily.   tamsulosin 0.4 MG Caps capsule Commonly known as: FLOMAX TAKE 1 CAPSULE BY MOUTH EVERY DAY   torsemide 20 MG tablet Commonly known as: DEMADEX Take 60 mg by mouth daily.   torsemide 20 MG tablet Commonly known as: DEMADEX Take 3 tablets (60 mg total) by mouth daily.        Allergies:  Allergies  Allergen Reactions   Hydromorphone     Hallucination Pt reports he doesn't know about this    Family History: Family History  Problem Relation Age of Onset   Hypertension Mother    Heart disease Mother    CAD Father    Heart attack Father     Social History:   reports that he quit smoking about 18 years ago. His smoking use included cigarettes. He started smoking about 64 years ago. He has a 46.00 pack-year smoking history. He has been exposed to tobacco smoke. He quit smokeless tobacco use about 18 years ago.  His smokeless tobacco use included snuff. He reports that he does not currently use alcohol after a past usage of about 7.0 standard drinks of alcohol per week. He reports that he does not use drugs.  Physical Exam: BP (!) 91/51   Pulse 90   Temp 97.7 F (36.5 C)   Ht '6\' 2"'$  (1.88 m)   Wt 180 lb (81.6 kg)   SpO2 90% Comment: on oxygen-5 liters  BMI 23.11 kg/m   Constitutional:  Alert and oriented, no acute distress, nontoxic appearing HEENT: [place], AT Cardiovascular: No clubbing, cyanosis, or edema Respiratory: Normal respiratory effort, no increased work of breathing Skin: No rashes, bruises or suspicious lesions Neurologic: Grossly intact, no focal deficits, moving all 4 extremities Psychiatric: Normal mood and affect  Laboratory Data: Results for orders placed or performed in visit on 07/12/21  CULTURE, URINE COMPREHENSIVE   Specimen:  Urine   UR  Result Value Ref Range   Urine Culture, Comprehensive Final report (A)    Organism ID, Bacteria Escherichia coli (A)    ANTIMICROBIAL SUSCEPTIBILITY Comment   Microscopic Examination   Urine  Result Value Ref Range   WBC, UA >30 (A)  0 - 5 /hpf   RBC, Urine 3-10 (A) 0 - 2 /hpf   Epithelial Cells (non renal) 0-10 0 - 10 /hpf   Casts Present (A) None seen /lpf   Cast Type Hyaline casts N/A   Bacteria, UA Many (A) None seen/Few  Urinalysis, Complete  Result Value Ref Range   Specific Gravity, UA 1.015 1.005 - 1.030   pH, UA 7.0 5.0 - 7.5   Color, UA Orange Yellow   Appearance Ur Clear Clear   Leukocytes,UA 3+ (A) Negative   Protein,UA 1+ (A) Negative/Trace   Glucose, UA Trace (A) Negative   Ketones, UA Negative Negative   RBC, UA Trace (A) Negative   Bilirubin, UA Negative Negative   Urobilinogen, Ur 1.0 0.2 - 1.0 mg/dL   Nitrite, UA Positive (A) Negative   Microscopic Examination See below:   BLADDER SCAN AMB NON-IMAGING  Result Value Ref Range   Scan Result 59m    Assessment & Plan:   1. Recurrent UTI UA again grossly infected today with no symptomatic improvement with his recent course of IV antibiotics x7 days.  With recurrent MDR E. coli UTIs and acute worsening in obstructive urinary symptoms, I suspect this may represent chronic bacterial prostatitis that would require a longer course of antibiotic therapy, 4 to 6 weeks in duration.  I have reached out to Dr. RDelaine Lameto discuss.  We will repeat urine culture today and contact patient with results and plan.. - Urinalysis, Complete - BLADDER SCAN AMB NON-IMAGING - CULTURE, URINE COMPREHENSIVE   Return for Will call with results.  SDebroah Loop PA-C  [facility] [address] [place]ald[place] [phone]

## 2021-07-14 DIAGNOSIS — J479 Bronchiectasis, uncomplicated: Secondary | ICD-10-CM | POA: Diagnosis not present

## 2021-07-15 LAB — CULTURE, URINE COMPREHENSIVE

## 2021-07-16 ENCOUNTER — Telehealth: Payer: Self-pay | Admitting: *Deleted

## 2021-07-16 ENCOUNTER — Other Ambulatory Visit: Payer: Self-pay | Admitting: Physician Assistant

## 2021-07-16 DIAGNOSIS — R300 Dysuria: Secondary | ICD-10-CM

## 2021-07-16 NOTE — Telephone Encounter (Signed)
-----   Message from Debroah Loop, [place] sent at 07/16/2021  4:05 PM EDT ----- I've been in contact with Dr. Delaine Lame regarding this patient. Please have him increase his Flomax (tamsulosin) to twice daily. I'd like him to undergo a CT pelvis with contrast (I've placed orders for this), and the imaging department will call him to schedule. Will contact him with his results. He should contact us if he develops fever, chills, nausea, vomiting, or flank pain.

## 2021-07-16 NOTE — Telephone Encounter (Signed)
Spoke with patient and daughter about results, asked pt to increase Flomax and scheduling will be call to schedule CT scan. Pt, daughter and granddaughter wanting to know if he can try a different IV abx? I advised they would need to speak with ID concerning the abx. Pt and daughter don't think increasing the flomax will help?

## 2021-07-19 NOTE — Telephone Encounter (Signed)
I just spoke with granddaughter [name] via telephone. We discussed the need for a CT scan of the pelvis to evaluate for prostate abscess or stranding that would be concerning for infection. We have him scheduled for this scan tomorrow afternoon at 1:30 with arrival time 1:15. Granddaughter reports they will be able to make that appt.  We discussed that Dr. Delaine Lame and I will review his results and be in touch with next steps. He denies fevers. We discussed that he should proceed to the ED overnight if he becomes febrile. She expressed understanding.

## 2021-07-20 ENCOUNTER — Ambulatory Visit
Admission: RE | Admit: 2021-07-20 | Discharge: 2021-07-20 | Disposition: A | Discharge status: Home / Self Care | Payer: Medicare HMO | Source: Ambulatory Visit | Attending: Physician Assistant | Admitting: Physician Assistant

## 2021-07-20 DIAGNOSIS — S32009A Unspecified fracture of unspecified lumbar vertebra, initial encounter for closed fracture: Secondary | ICD-10-CM | POA: Diagnosis not present

## 2021-07-20 DIAGNOSIS — K4090 Unilateral inguinal hernia, without obstruction or gangrene, not specified as recurrent: Secondary | ICD-10-CM | POA: Insufficient documentation

## 2021-07-20 DIAGNOSIS — R300 Dysuria: Secondary | ICD-10-CM | POA: Insufficient documentation

## 2021-07-20 DIAGNOSIS — N401 Enlarged prostate with lower urinary tract symptoms: Secondary | ICD-10-CM | POA: Diagnosis not present

## 2021-07-20 DIAGNOSIS — K5730 Diverticulosis of large intestine without perforation or abscess without bleeding: Secondary | ICD-10-CM | POA: Insufficient documentation

## 2021-07-20 DIAGNOSIS — N400 Enlarged prostate without lower urinary tract symptoms: Secondary | ICD-10-CM | POA: Diagnosis not present

## 2021-07-20 MED ORDER — IOHEXOL 300 MG/ML  SOLN
100.0000 mL | Freq: Once | INTRAMUSCULAR | Status: AC | PRN
  Administered 2021-07-20: 100 mL via INTRAVENOUS

## 2021-07-22 ENCOUNTER — Other Ambulatory Visit: Payer: Self-pay | Admitting: *Deleted

## 2021-07-22 MED ORDER — AMOXICILLIN-POT CLAVULANATE 875-125 MG PO TABS
1.0000 | ORAL_TABLET | Freq: Two times a day (BID) | ORAL | 0 refills | Status: AC
Start: 2021-07-22 — End: 2021-07-29

## 2021-07-23 ENCOUNTER — Encounter: Payer: Self-pay | Admitting: Family

## 2021-07-23 ENCOUNTER — Ambulatory Visit: Payer: Medicare HMO | Attending: Family | Admitting: Family

## 2021-07-23 VITALS — BP 103/63 | HR 87 | Resp 18 | Ht 74.0 in | Wt 185.0 lb

## 2021-07-23 DIAGNOSIS — Z8673 Personal history of transient ischemic attack (TIA), and cerebral infarction without residual deficits: Secondary | ICD-10-CM | POA: Insufficient documentation

## 2021-07-23 DIAGNOSIS — Z7982 Long term (current) use of aspirin: Secondary | ICD-10-CM | POA: Diagnosis not present

## 2021-07-23 DIAGNOSIS — I5032 Chronic diastolic (congestive) heart failure: Secondary | ICD-10-CM | POA: Diagnosis not present

## 2021-07-23 DIAGNOSIS — G8929 Other chronic pain: Secondary | ICD-10-CM | POA: Insufficient documentation

## 2021-07-23 DIAGNOSIS — J440 Chronic obstructive pulmonary disease with acute lower respiratory infection: Secondary | ICD-10-CM | POA: Diagnosis not present

## 2021-07-23 DIAGNOSIS — J441 Chronic obstructive pulmonary disease with (acute) exacerbation: Secondary | ICD-10-CM | POA: Diagnosis not present

## 2021-07-23 DIAGNOSIS — N390 Urinary tract infection, site not specified: Secondary | ICD-10-CM

## 2021-07-23 DIAGNOSIS — Z87440 Personal history of urinary (tract) infections: Secondary | ICD-10-CM | POA: Insufficient documentation

## 2021-07-23 DIAGNOSIS — N189 Chronic kidney disease, unspecified: Secondary | ICD-10-CM | POA: Diagnosis not present

## 2021-07-23 DIAGNOSIS — Z9981 Dependence on supplemental oxygen: Secondary | ICD-10-CM | POA: Insufficient documentation

## 2021-07-23 DIAGNOSIS — Z87891 Personal history of nicotine dependence: Secondary | ICD-10-CM | POA: Insufficient documentation

## 2021-07-23 DIAGNOSIS — I130 Hypertensive heart and chronic kidney disease with heart failure and stage 1 through stage 4 chronic kidney disease, or unspecified chronic kidney disease: Secondary | ICD-10-CM | POA: Insufficient documentation

## 2021-07-23 DIAGNOSIS — I10 Essential (primary) hypertension: Secondary | ICD-10-CM | POA: Diagnosis not present

## 2021-07-23 DIAGNOSIS — Z79899 Other long term (current) drug therapy: Secondary | ICD-10-CM | POA: Diagnosis not present

## 2021-07-23 DIAGNOSIS — E785 Hyperlipidemia, unspecified: Secondary | ICD-10-CM | POA: Diagnosis not present

## 2021-07-23 DIAGNOSIS — I4811 Longstanding persistent atrial fibrillation: Secondary | ICD-10-CM

## 2021-07-23 DIAGNOSIS — Z86718 Personal history of other venous thrombosis and embolism: Secondary | ICD-10-CM | POA: Insufficient documentation

## 2021-07-23 DIAGNOSIS — I2510 Atherosclerotic heart disease of native coronary artery without angina pectoris: Secondary | ICD-10-CM | POA: Diagnosis not present

## 2021-07-23 DIAGNOSIS — Z7902 Long term (current) use of antithrombotics/antiplatelets: Secondary | ICD-10-CM | POA: Insufficient documentation

## 2021-07-23 DIAGNOSIS — J449 Chronic obstructive pulmonary disease, unspecified: Secondary | ICD-10-CM

## 2021-07-23 DIAGNOSIS — K219 Gastro-esophageal reflux disease without esophagitis: Secondary | ICD-10-CM | POA: Diagnosis not present

## 2021-07-23 MED ORDER — CLOPIDOGREL BISULFATE 75 MG PO TABS: 75.0000 mg | ORAL_TABLET | Freq: Every day | ORAL | 3 refills | Status: DC

## 2021-07-23 NOTE — Patient Instructions (Addendum)
Resume weighing daily and call for an overnight weight gain of 3 pounds or more or a weekly weight gain of more than 5 pounds.   If you have voicemail, please make sure your mailbox is cleaned out so that we may leave a message and please make sure to listen to any voicemails.    Have your granddaughter check your medication list we print out and let me know about any errors.

## 2021-07-23 NOTE — Progress Notes (Signed)
Patient ID: [name] Browning, male    DOB: 1940-11-29, 81 y.o.   MRN: [id number]  HPI  Mr Bulman is a 81 y/o male with a history of carotid disease, CAD, hyperlipidemia, HTN, CKD, stroke, anxiety, atrial fibrillation, COPD, DVT, GERD, previous tobacco use and chronic heart failure.   Echo report from 03/17/2021 reviewed and showed an EF of 60-65%. Echo report from 11/15/20 reviewed and showed an EF of 60-65% along with severe LAE and mild MR.   LHC done 11/14/20 showed: Ost RCA lesion is 95% stenosed.   Prox RCA to Dist RCA lesion is 100% stenosed. -Very minimal flow.  After initial angiography, it disappeared to progressed to the ostium.   Left-to-right collaterals via septal perforators fill the PDA system.   Mid LM to Dist LM lesion is 20% stenosed.   Ost LAD to Prox LAD lesion is 25% stenosed.  Relatively small caliber vessel that wraps the apex.   Diminutive LCx with moderate sized OM1, mild disease.   The left ventricular systolic function is normal. No obvious regional wall motion normalities.   The left ventricular ejection fraction is 55-65% by visual estimate.   LV end diastolic pressure is moderately elevated.  18 mmHg   There is no aortic valve stenosis.  Was in the ED 04/20/21 due to acute on chronic HF. IV lasix given with improvement of symptoms and he was released. Admitted 3/17-3/20/23 for COPD exacerbation and PNA, eventually discharged home. In the ED overnight 3/15-3/16/23 for CAP, given abx and discharged home. Admitted 3/4-3/11/23 with acute on chronic respiratory failure, COPD and HF exacerbation, discharged home.   He presents today for a follow-up visit with a chief complaint of moderate shortness of breath with very little exertion. Describes this as chronic in nature. He has associated decreased appetite, fatigue, cough, palpitations, occasional dizziness, dysuria, urinary burning, weakness, chronic pain and difficulty sleeping along with this. He denies any abdominal  distention, pedal edema, chest pain, wheezing or weight gain.   Biggest complaint he has is of his chronic UTI symptoms that can't get resolved. Recently had a pelvic CT and is currently scheduled for a cystoscopy.   Brought a medication list but has 2 different ones and he can't clarify because his granddaughter fixes his pill box for him.   Past Medical History:  Diagnosis Date   Anginal pain ([place])    Anxiety    Aortic atherosclerosis ([place])    Atrial fibrillation ([place]) 01/2019   Bilateral carpal tunnel syndrome    CAD (coronary artery disease)    a.) LHC 2004 --> normal coronaries. b.) normal stress test in 2007 and 2011; c.) Lexiscan 05/20/2014 --> LVEF 55-65%; no significant stress induced ischemia/arrythmia. d.) CT chest 03/25/2019 --> coronaries carcified.   Carotid atherosclerosis, bilateral    Carpal tunnel syndrome, bilateral    CHF (congestive heart failure) (HCC)    Chronic anticoagulation    a.) ASA + apixaban   COPD (chronic obstructive pulmonary disease) (HCC)    CVA (cerebral vascular accident) ([place])    Degenerative disc disease, cervical    Diastolic dysfunction    a.) TTE 05/29/2014 --> LVEF 60-65%; G1DD.   DVT (deep venous thrombosis) (HCC)    GERD (gastroesophageal reflux disease)    History of 2019 novel coronavirus disease (COVID-19) 02/08/2019   HLD (hyperlipidemia)    Hypertension    Kidney stones    Osteoarthritis of right shoulder    Pneumonia    Respiratory failure, acute ([place]) 09/20/2020  a.) severe respiratory distress 1 hour after urological surgery. CXR (+) for acute pulmonary edema. Transferred to ICU and placed on NIPPV. Questionable aspiration PNA. (+) A.fib with RVR. Improved with ABX, diuresis, and amiodarone.   Skin cancer of face    a.) RIGHT ear and RIGHT forehead; excised.   Syncope    TIA (transient ischemic attack) 2016   Valvular regurgitation    a.) TTE 05/26/2014 --> LVEF 60-65%; trivial MR, mild TR; no AR or PR. b.) TTE 04/20/2015  --> LVEF 55-60%; trivial MR and PR; no AR or TR.   Past Surgical History:  Procedure Laterality Date   CARDIAC CATHETERIZATION  2004   CARPAL TUNNEL RELEASE Right 06/11/2013   CENTRAL LINE INSERTION  11/14/2020   Procedure: CENTRAL LINE INSERTION;  Surgeon: Leonie Man, MD;  Location: [facility];  Service: Cardiovascular;;   CORONARY/GRAFT ACUTE MI REVASCULARIZATION N/A 11/14/2020   Procedure: Coronary/Graft Acute MI Revascularization;  Surgeon: Leonie Man, MD;  Location: [facility];  Service: Cardiovascular;  Laterality: N/A;   CYSTOSCOPY W/ RETROGRADES  10/16/2020   Procedure: CYSTOSCOPY WITH RETROGRADE PYELOGRAM;  Surgeon: Billey Co, MD;  Location: [facility];  Service: Urology;;   CYSTOSCOPY W/ URETERAL STENT PLACEMENT Left 09/20/2020   Procedure: CYSTOSCOPY WITH RETROGRADE PYELOGRAM/URETERAL STENT PLACEMENT;  Surgeon: Janith Lima, MD;  Location: [facility];  Service: Urology;  Laterality: Left;   CYSTOSCOPY/URETEROSCOPY/HOLMIUM LASER/STENT PLACEMENT Left 10/16/2020   Procedure: CYSTOSCOPY/URETEROSCOPY/HOLMIUM LASER/STENT PLACEMENT;  Surgeon: Billey Co, MD;  Location: [facility];  Service: Urology;  Laterality: Left;   ESOPHAGOGASTRODUODENOSCOPY N/A 11/06/2020   Procedure: ESOPHAGOGASTRODUODENOSCOPY (EGD);  Surgeon: Lucilla Lame, MD;  Location: [facility];  Service: Endoscopy;  Laterality: N/A;   LEFT HEART CATH AND CORONARY ANGIOGRAPHY N/A 11/14/2020   Procedure: LEFT HEART CATH AND CORONARY ANGIOGRAPHY;  Surgeon: Leonie Man, MD;  Location: [facility];  Service: Cardiovascular;  Laterality: N/A;   SHOULDER ARTHROSCOPY WITH OPEN ROTATOR CUFF REPAIR Right 08/24/2017   Procedure: SHOULDER ARTHROSCOPY WITH OPEN ROTATOR CUFF REPAIR;  Surgeon: Corky Mull, MD;  Location: [facility];  Service: Orthopedics;  Laterality: Right;   SKIN CANCER EXCISION  12/01/2016   right ear    SKIN CANCER EXCISION     remove from the right side of the face     THROMBECTOMY Right 2004   leg   THYROIDECTOMY  1950   Not sure if total or partial thyroidectomy.   Family History  Problem Relation Age of Onset   Hypertension Mother    Heart disease Mother    CAD Father    Heart attack Father    Social History   Tobacco Use   Smoking status: Former    Packs/day: 1.00    Years: 46.00    Total pack years: 46.00    Types: Cigarettes    Start date: 01/10/1957    Quit date: 02/11/2003    Years since quitting: 18.4    Passive exposure: Past   Smokeless tobacco: Former    Types: Snuff    Quit date: 02/11/2003  Substance Use Topics   Alcohol use: Not Currently    Alcohol/week: 7.0 standard drinks of alcohol    Types: 7 Cans of beer per week   Allergies  Allergen Reactions   Hydromorphone     Hallucination Pt reports he doesn't know about this   Prior to Admission medications   Medication Sig Start Date End Date Taking? Authorizing Provider  acetaminophen (  TYLENOL) 500 MG tablet Take 2 tablets (1,000 mg total) by mouth every 6 (six) hours as needed for mild pain. 05/07/20  Yes Piscoya, Jacqulyn Bath, MD  albuterol (VENTOLIN HFA) 108 (90 Base) MCG/ACT inhaler Inhale into the lungs every 6 (six) hours as needed for wheezing or shortness of breath.   Yes [provider]  amoxicillin-clavulanate (AUGMENTIN) 875-125 MG tablet Take 1 tablet by mouth every 12 (twelve) hours for 7 days. 07/22/21 07/29/21 Yes Billey Co, MD  aspirin EC 81 MG tablet Take 81 mg by mouth daily after supper.   Yes [provider]  atorvastatin (LIPITOR) 40 MG tablet Take 40 mg by mouth daily after supper.   Yes [provider]  cetirizine (ZYRTEC) 10 MG tablet Take 10 mg by mouth daily. 09/29/20  Yes [provider]  digoxin (LANOXIN) 0.125 MG tablet Take 125 mcg by mouth daily. 02/18/21  Yes [provider]  famotidine (PEPCID) 20 MG tablet One after supper 10/06/20  Yes Tanda Rockers, MD  ferrous gluconate (FERGON) 240 (27 FE) MG  tablet Take 240 mg by mouth 3 (three) times daily with meals.   Yes [provider]  fluticasone (FLONASE) 50 MCG/ACT nasal spray Place 1 spray into both nostrils daily. 03/20/21  Yes [provider]  isosorbide mononitrate (IMDUR) 30 MG 24 hr tablet Take 0.5 tablets (15 mg total) by mouth daily. 12/03/20  Yes Lavina Hamman, MD  losartan (COZAAR) 50 MG tablet Take 50 mg by mouth daily. 06/29/21  Yes [provider]  magnesium oxide (MAG-OX) 400 MG tablet Take 400 mg by mouth daily. 03/21/21 03/21/22 Yes [provider]  metoprolol tartrate (LOPRESSOR) 25 MG tablet Take 25 mg by mouth 2 (two) times daily.   Yes [provider]  montelukast (SINGULAIR) 10 MG tablet Take 10 mg by mouth at bedtime.   Yes [provider]  nitroGLYCERIN (NITROSTAT) 0.4 MG SL tablet Place under the tongue. 03/20/21  Yes [provider]  pantoprazole (PROTONIX) 40 MG tablet Take 1 tablet (40 mg total) by mouth 2 (two) times daily. 12/02/20  Yes Lavina Hamman, MD  phenazopyridine (PYRIDIUM) 200 MG tablet Take 1 tablet (200 mg total) by mouth 3 (three) times daily as needed (bladder pain). 09/28/20  Yes Pokhrel, Laxman, MD  potassium chloride SA (KLOR-CON M) 20 MEQ tablet Take 1 tablet (20 mEq total) by mouth daily. 12/18/20  Yes Jaylaa Gallion, Otila Kluver A, FNP  ranolazine (RANEXA) 1000 MG SR tablet Take 1,000 mg by mouth 2 (two) times daily. 05/31/21  Yes [provider]  tamsulosin (FLOMAX) 0.4 MG CAPS capsule TAKE 1 CAPSULE BY MOUTH EVERY DAY 11/09/20  Yes Billey Co, MD  torsemide (DEMADEX) 20 MG tablet Take 3 tablets (60 mg total) by mouth daily. 06/01/21  Yes Jerrad Mendibles A, FNP  Budeson-Glycopyrrol-Formoterol (BREZTRI AEROSPHERE) 160-9-4.8 MCG/ACT AERO Inhale 2 puffs into the lungs in the morning and at bedtime. 02/17/21   Flora Lipps, MD  clopidogrel (PLAVIX) 75 MG tablet Take 75 mg by mouth daily. Patient not taking: Reported on 07/23/2021 03/20/21    [provider]  docusate sodium (COLACE) 100 MG capsule Take 1 capsule (100 mg total) by mouth 2 (two) times daily. Patient not taking: Reported on 06/09/2021 12/02/20 12/02/21  Lavina Hamman, MD  lactulose (CHRONULAC) 10 GM/15ML solution Take 30 mLs (20 g total) by mouth 2 (two) times daily as needed for moderate constipation. Patient not taking: Reported on 06/09/2021 12/02/20   Posey Pronto,  Josetta Huddle, MD  polyethylene glycol (MIRALAX / GLYCOLAX) 17 g packet Take 17 g by mouth daily as needed for mild constipation or moderate constipation. Patient not taking: Reported on 06/09/2021 09/28/20   Pokhrel, Corrie Mckusick, MD  roflumilast (DALIRESP) 500 MCG TABS tablet Take 1 tablet (500 mcg total) by mouth daily. Patient not taking: Reported on 07/23/2021 06/08/21   Flora Lipps, MD   Review of Systems  Constitutional:  Positive for appetite change (fluctuates) and fatigue (easily).  HENT:  Positive for sneezing. Negative for congestion, rhinorrhea and sore throat.   Eyes: Negative.   Respiratory:  Positive for cough (productive) and shortness of breath (easily). Negative for chest tightness and wheezing.   Cardiovascular:  Positive for palpitations (at times). Negative for chest pain and leg swelling.  Gastrointestinal:  Negative for abdominal distention, abdominal pain and vomiting.  Endocrine: Negative.   Genitourinary:  Positive for dysuria and frequency. Negative for hematuria.  Musculoskeletal:  Positive for back pain (hurts when walking). Negative for arthralgias and neck pain.  Skin: Negative.   Allergic/Immunologic: Negative.   Neurological:  Positive for dizziness (sometimes) and weakness. Negative for light-headedness.  Hematological:  Negative for adenopathy. Bruises/bleeds easily.  Psychiatric/Behavioral:  Positive for sleep disturbance (sleeping in hospital bed). Negative for dysphoric mood. The patient is not nervous/anxious.    Vitals:   07/23/21 1002 07/23/21 1024  BP: 103/63    Pulse: 87   Resp: 18   SpO2: (!) 87% 94%  Weight: 185 lb (83.9 kg)   Height: '6\' 2"'$  (1.88 m)    Wt Readings from Last 3 Encounters:  07/23/21 185 lb (83.9 kg)  07/12/21 180 lb (81.6 kg)  06/08/21 178 lb 6.4 oz (80.9 kg)   Lab Results  Component Value Date   CREATININE 1.35 (H) 06/11/2021   CREATININE 1.25 (H) 04/19/2021   CREATININE 1.36 (H) 03/24/2021   Physical Exam Vitals and nursing note reviewed.  Constitutional:      General: He is not in acute distress.    Appearance: Normal appearance. He is ill-appearing. He is not toxic-appearing.  HENT:     Head: Normocephalic and atraumatic.  Cardiovascular:     Rate and Rhythm: Normal rate. Rhythm irregular.  Pulmonary:     Effort: No respiratory distress.     Breath sounds: No wheezing, rhonchi or rales.  Abdominal:     General: There is no distension.     Palpations: Abdomen is soft.     Tenderness: There is no abdominal tenderness.  Musculoskeletal:     Cervical back: Normal range of motion and neck supple.     Right lower leg: No tenderness. No edema.     Left lower leg: No tenderness. No edema.  Skin:    General: Skin is warm and dry.  Neurological:     General: No focal deficit present.     Mental Status: He is alert and oriented to person, place, and time.  Psychiatric:        Mood and Affect: Mood normal. Mood is not anxious.        Behavior: Behavior normal.    Assessment & Plan:  1: Chronic heart failure with preserved ejection fraction with structural changes (LAE)- - NYHA class III - euvolemic today - not weighing daily; encouraged to resume so that he can call for an overnight weight gain of > 2 pounds or a weekly weight gain of > 5 pounds - weight stable from last visit here 3 months ago - not  adding salt to his food and trying to read food labels for sodium content; reports decrease appetite due to decreased taste - current BP will not allow for entresto - having issues with recurrent UT'sI so would  not be a candidate for SGLT2 - BNP 12/28/20 was 511.4   2: HTN- - BP looks good (103/63) - sees PCP ([facility]) but has to schedule a f/u - BMP 06/11/21 reviewed and showed sodium 139, potassium 3.8, creatinine 1.35 and GFR 53  3: COPD- - saw pulmonology ([facility]) 06/08/21 - wearing oxygen at 5L around the clock - reports oxygen dropping quickly even with short exertion although quickly recovers  4: Atrial fibrillation- - saw cardiology Humphrey Rolls) 05/31/21 - irregular rhythm noted  5: Recurrent UTI's- - saw urology Rosita Kea) 07/12/21 - saw ID Delaine Lame) 06/03/21 - had pelvic CT done 07/20/21 to rule out prostate abscess   Patient brought 2 separate medication lists which are different. Instructed him to have his granddaughter review the list that we print out and let us know of any errors. This was put on his AVS.   Due to numerous other medical issues, offered to make f/u as PRN but he prefers to be seen yearly. Did advise him that he could call back at anytime to make a sooner appointment.

## 2021-07-28 ENCOUNTER — Ambulatory Visit: Payer: Medicare HMO | Admitting: Family

## 2021-07-29 ENCOUNTER — Other Ambulatory Visit: Payer: Medicare HMO | Admitting: Urology

## 2021-08-02 DIAGNOSIS — I4891 Unspecified atrial fibrillation: Secondary | ICD-10-CM | POA: Diagnosis not present

## 2021-08-02 DIAGNOSIS — K219 Gastro-esophageal reflux disease without esophagitis: Secondary | ICD-10-CM | POA: Diagnosis not present

## 2021-08-02 DIAGNOSIS — J449 Chronic obstructive pulmonary disease, unspecified: Secondary | ICD-10-CM | POA: Diagnosis not present

## 2021-08-02 DIAGNOSIS — I340 Nonrheumatic mitral (valve) insufficiency: Secondary | ICD-10-CM | POA: Diagnosis not present

## 2021-08-02 DIAGNOSIS — I10 Essential (primary) hypertension: Secondary | ICD-10-CM | POA: Diagnosis not present

## 2021-08-02 DIAGNOSIS — E782 Mixed hyperlipidemia: Secondary | ICD-10-CM | POA: Diagnosis not present

## 2021-08-06 ENCOUNTER — Other Ambulatory Visit: Payer: Self-pay

## 2021-08-06 ENCOUNTER — Encounter: Payer: Self-pay | Admitting: Emergency Medicine

## 2021-08-06 DIAGNOSIS — R339 Retention of urine, unspecified: Secondary | ICD-10-CM | POA: Diagnosis present

## 2021-08-06 DIAGNOSIS — K4090 Unilateral inguinal hernia, without obstruction or gangrene, not specified as recurrent: Secondary | ICD-10-CM | POA: Diagnosis not present

## 2021-08-06 DIAGNOSIS — R109 Unspecified abdominal pain: Secondary | ICD-10-CM | POA: Diagnosis not present

## 2021-08-06 DIAGNOSIS — I130 Hypertensive heart and chronic kidney disease with heart failure and stage 1 through stage 4 chronic kidney disease, or unspecified chronic kidney disease: Secondary | ICD-10-CM | POA: Diagnosis not present

## 2021-08-06 DIAGNOSIS — Z79899 Other long term (current) drug therapy: Secondary | ICD-10-CM | POA: Diagnosis not present

## 2021-08-06 DIAGNOSIS — N200 Calculus of kidney: Secondary | ICD-10-CM | POA: Diagnosis not present

## 2021-08-06 DIAGNOSIS — N189 Chronic kidney disease, unspecified: Secondary | ICD-10-CM | POA: Diagnosis not present

## 2021-08-06 DIAGNOSIS — Z7982 Long term (current) use of aspirin: Secondary | ICD-10-CM | POA: Insufficient documentation

## 2021-08-06 DIAGNOSIS — I509 Heart failure, unspecified: Secondary | ICD-10-CM | POA: Diagnosis not present

## 2021-08-06 DIAGNOSIS — N390 Urinary tract infection, site not specified: Secondary | ICD-10-CM | POA: Insufficient documentation

## 2021-08-06 DIAGNOSIS — D7389 Other diseases of spleen: Secondary | ICD-10-CM | POA: Diagnosis not present

## 2021-08-06 DIAGNOSIS — K5730 Diverticulosis of large intestine without perforation or abscess without bleeding: Secondary | ICD-10-CM | POA: Diagnosis not present

## 2021-08-06 DIAGNOSIS — Z85828 Personal history of other malignant neoplasm of skin: Secondary | ICD-10-CM | POA: Insufficient documentation

## 2021-08-06 DIAGNOSIS — Z7902 Long term (current) use of antithrombotics/antiplatelets: Secondary | ICD-10-CM | POA: Diagnosis not present

## 2021-08-06 DIAGNOSIS — J449 Chronic obstructive pulmonary disease, unspecified: Secondary | ICD-10-CM | POA: Insufficient documentation

## 2021-08-06 DIAGNOSIS — I2510 Atherosclerotic heart disease of native coronary artery without angina pectoris: Secondary | ICD-10-CM | POA: Insufficient documentation

## 2021-08-06 NOTE — ED Triage Notes (Signed)
Pt reports he is not able to empty his bladder, reports he urinates some drops. Pt wear 2L/[place] oxygen all the time due to COPD. Pt talks in complete sentences

## 2021-08-07 ENCOUNTER — Emergency Department: Payer: Medicare HMO

## 2021-08-07 ENCOUNTER — Emergency Department
Admission: EM | Admit: 2021-08-07 | Discharge: 2021-08-07 | Disposition: A | Discharge status: Home / Self Care | Payer: Medicare HMO | Attending: Emergency Medicine | Admitting: Emergency Medicine

## 2021-08-07 DIAGNOSIS — N390 Urinary tract infection, site not specified: Secondary | ICD-10-CM | POA: Diagnosis not present

## 2021-08-07 DIAGNOSIS — K4090 Unilateral inguinal hernia, without obstruction or gangrene, not specified as recurrent: Secondary | ICD-10-CM | POA: Diagnosis not present

## 2021-08-07 DIAGNOSIS — D7389 Other diseases of spleen: Secondary | ICD-10-CM | POA: Diagnosis not present

## 2021-08-07 DIAGNOSIS — N200 Calculus of kidney: Secondary | ICD-10-CM | POA: Diagnosis not present

## 2021-08-07 DIAGNOSIS — K5730 Diverticulosis of large intestine without perforation or abscess without bleeding: Secondary | ICD-10-CM | POA: Diagnosis not present

## 2021-08-07 LAB — CBC
HCT: 36 % — ABNORMAL LOW (ref 39.0–52.0)
Hemoglobin: 11.6 g/dL — ABNORMAL LOW (ref 13.0–17.0)
MCH: 29.4 pg (ref 26.0–34.0)
MCHC: 32.2 g/dL (ref 30.0–36.0)
MCV: 91.4 fL (ref 80.0–100.0)
Platelets: 251 10*3/uL (ref 150–400)
RBC: 3.94 MIL/uL — ABNORMAL LOW (ref 4.22–5.81)
RDW: 13.4 % (ref 11.5–15.5)
WBC: 9 10*3/uL (ref 4.0–10.5)
nRBC: 0 % (ref 0.0–0.2)

## 2021-08-07 LAB — BASIC METABOLIC PANEL
Anion gap: 9 (ref 5–15)
BUN: 26 mg/dL — ABNORMAL HIGH (ref 8–23)
CO2: 29 mmol/L (ref 22–32)
Calcium: 9.4 mg/dL (ref 8.9–10.3)
Chloride: 101 mmol/L (ref 98–111)
Creatinine, Ser: 1.49 mg/dL — ABNORMAL HIGH (ref 0.61–1.24)
GFR, Estimated: 47 mL/min — ABNORMAL LOW (ref >60)
Glucose, Bld: 110 mg/dL — ABNORMAL HIGH (ref 70–99)
Potassium: 3.5 mmol/L (ref 3.5–5.1)
Sodium: 139 mmol/L (ref 135–145)

## 2021-08-07 LAB — URINALYSIS, ROUTINE W REFLEX MICROSCOPIC
Specific Gravity, Urine: 1.012 (ref 1.005–1.030)
WBC, UA: >50 WBC/hpf — ABNORMAL HIGH (ref 0–5)

## 2021-08-07 MED ORDER — PIPERACILLIN-TAZOBACTAM 3.375 G IVPB 30 MIN
3.3750 g | Freq: Once | INTRAVENOUS | Status: AC
  Administered 2021-08-07: 3.375 g via INTRAVENOUS
  Filled 2021-08-07: qty 50

## 2021-08-07 MED ORDER — ACETAMINOPHEN 500 MG PO TABS
1000.0000 mg | ORAL_TABLET | Freq: Once | ORAL | Status: AC
  Administered 2021-08-07: 1000 mg via ORAL
  Filled 2021-08-07: qty 2

## 2021-08-07 MED ORDER — AMOXICILLIN-POT CLAVULANATE 875-125 MG PO TABS: 1.0000 | ORAL_TABLET | Freq: Two times a day (BID) | ORAL | 0 refills | Status: DC

## 2021-08-07 MED ORDER — ONDANSETRON 4 MG PO TBDP: 4.0000 mg | ORAL_TABLET | Freq: Four times a day (QID) | ORAL | 0 refills | Status: DC | PRN

## 2021-08-07 NOTE — ED Provider Notes (Signed)
[facility] Provider Note    Event Date/Time   First MD Initiated Contact with Patient 08/07/21 [phone]     (approximate)   History   Urinary Retention   HPI  [name] Browning is a 81 y.o. male with history of atrial fibrillation on apixaban, CHF, DVT, hypertension, hyperlipidemia, COPD on chronic oxygen who presents to the emergency department complaints of dysuria, urinary frequency and urgency over the past day.  Has had recurrent UTIs since having a Foley catheter placed earlier this year.  It appears multiple urine cultures have grown E. coli.  He denies any fever.  Has had some nausea but no vomiting today.  No diarrhea.  Is having some lower back pain and tells me that he thinks he has a history of kidney stones.   History provided by patient.    Past Medical History:  Diagnosis Date   Anginal pain ([place])    Anxiety    Aortic atherosclerosis ([place])    Atrial fibrillation ([place]) 01/2019   Bilateral carpal tunnel syndrome    CAD (coronary artery disease)    a.) LHC 2004 --> normal coronaries. b.) normal stress test in 2007 and 2011; c.) Lexiscan 05/20/2014 --> LVEF 55-65%; no significant stress induced ischemia/arrythmia. d.) CT chest 03/25/2019 --> coronaries carcified.   Carotid atherosclerosis, bilateral    Carpal tunnel syndrome, bilateral    CHF (congestive heart failure) (HCC)    Chronic anticoagulation    a.) ASA + apixaban   COPD (chronic obstructive pulmonary disease) (HCC)    CVA (cerebral vascular accident) ([place])    Degenerative disc disease, cervical    Diastolic dysfunction    a.) TTE 05/29/2014 --> LVEF 60-65%; G1DD.   DVT (deep venous thrombosis) (HCC)    GERD (gastroesophageal reflux disease)    History of 2019 novel coronavirus disease (COVID-19) 02/08/2019   HLD (hyperlipidemia)    Hypertension    Kidney stones    Osteoarthritis of right shoulder    Pneumonia    Respiratory failure, acute ([place]) 09/20/2020   a.) severe  respiratory distress 1 hour after urological surgery. CXR (+) for acute pulmonary edema. Transferred to ICU and placed on NIPPV. Questionable aspiration PNA. (+) A.fib with RVR. Improved with ABX, diuresis, and amiodarone.   Skin cancer of face    a.) RIGHT ear and RIGHT forehead; excised.   Syncope    TIA (transient ischemic attack) 2016   Valvular regurgitation    a.) TTE 05/26/2014 --> LVEF 60-65%; trivial MR, mild TR; no AR or PR. b.) TTE 04/20/2015 --> LVEF 55-60%; trivial MR and PR; no AR or TR.    Past Surgical History:  Procedure Laterality Date   CARDIAC CATHETERIZATION  2004   CARPAL TUNNEL RELEASE Right 06/11/2013   CENTRAL LINE INSERTION  11/14/2020   Procedure: CENTRAL LINE INSERTION;  Surgeon: Leonie Man, MD;  Location: [facility] [place] [facility];  Service: Cardiovascular;;   CORONARY/GRAFT ACUTE MI REVASCULARIZATION N/A 11/14/2020   Procedure: Coronary/Graft Acute MI Revascularization;  Surgeon: Leonie Man, MD;  Location: [place] [facility];  Service: Cardiovascular;  Laterality: N/A;   CYSTOSCOPY W/ RETROGRADES  10/16/2020   Procedure: CYSTOSCOPY WITH RETROGRADE PYELOGRAM;  Surgeon: Billey Co, MD;  Location: [facility];  Service: Urology;;   CYSTOSCOPY W/ URETERAL STENT PLACEMENT Left 09/20/2020   Procedure: CYSTOSCOPY WITH RETROGRADE PYELOGRAM/URETERAL STENT PLACEMENT;  Surgeon: Janith Lima, MD;  Location: [facility];  Service: Urology;  Laterality: Left;   CYSTOSCOPY/URETEROSCOPY/HOLMIUM LASER/STENT  PLACEMENT Left 10/16/2020   Procedure: CYSTOSCOPY/URETEROSCOPY/HOLMIUM LASER/STENT PLACEMENT;  Surgeon: Billey Co, MD;  Location: [facility];  Service: Urology;  Laterality: Left;   ESOPHAGOGASTRODUODENOSCOPY N/A 11/06/2020   Procedure: ESOPHAGOGASTRODUODENOSCOPY (EGD);  Surgeon: Lucilla Lame, MD;  Location: [facility];  Service: Endoscopy;  Laterality: N/A;   LEFT HEART CATH AND CORONARY ANGIOGRAPHY N/A 11/14/2020   Procedure: LEFT HEART CATH AND CORONARY  ANGIOGRAPHY;  Surgeon: Leonie Man, MD;  Location: [place] [facility];  Service: Cardiovascular;  Laterality: N/A;   SHOULDER ARTHROSCOPY WITH OPEN ROTATOR CUFF REPAIR Right 08/24/2017   Procedure: SHOULDER ARTHROSCOPY WITH OPEN ROTATOR CUFF REPAIR;  Surgeon: Corky Mull, MD;  Location: [facility];  Service: Orthopedics;  Laterality: Right;   SKIN CANCER EXCISION  12/01/2016   right ear    SKIN CANCER EXCISION     remove from the right side of the face    THROMBECTOMY Right 2004   leg   THYROIDECTOMY  1950   Not sure if total or partial thyroidectomy.    MEDICATIONS:  Prior to Admission medications   Medication Sig Start Date End Date Taking? Authorizing Provider  acetaminophen (TYLENOL) 500 MG tablet Take 2 tablets (1,000 mg total) by mouth every 6 (six) hours as needed for mild pain. 05/07/20   Olean Ree, MD  albuterol (VENTOLIN HFA) 108 (90 Base) MCG/ACT inhaler Inhale into the lungs every 6 (six) hours as needed for wheezing or shortness of breath.    [provider]  aspirin EC 81 MG tablet Take 81 mg by mouth daily after supper.    [provider]  atorvastatin (LIPITOR) 40 MG tablet Take 40 mg by mouth daily after supper.    [provider]  Budeson-Glycopyrrol-Formoterol (BREZTRI AEROSPHERE) 160-9-4.8 MCG/ACT AERO Inhale 2 puffs into the lungs in the morning and at bedtime. 02/17/21   Flora Lipps, MD  cetirizine (ZYRTEC) 10 MG tablet Take 10 mg by mouth daily. 09/29/20   [provider]  clopidogrel (PLAVIX) 75 MG tablet Take 1 tablet (75 mg total) by mouth daily. 07/23/21   Alisa Graff, FNP  digoxin (LANOXIN) 0.125 MG tablet Take 125 mcg by mouth daily. 02/18/21   [provider]  docusate sodium (COLACE) 100 MG capsule Take 1 capsule (100 mg total) by mouth 2 (two) times daily. Patient not taking: Reported on 06/09/2021 12/02/20 12/02/21  Lavina Hamman, MD  famotidine (PEPCID) 20 MG tablet One after supper 10/06/20   Tanda Rockers, MD  ferrous gluconate (FERGON) 240 (27 FE) MG tablet Take 240 mg by mouth 3 (three) times daily with meals.    [provider]  fluticasone (FLONASE) 50 MCG/ACT nasal spray Place 1 spray into both nostrils daily. 03/20/21   [provider]  isosorbide mononitrate (IMDUR) 30 MG 24 hr tablet Take 0.5 tablets (15 mg total) by mouth daily. 12/03/20   Lavina Hamman, MD  lactulose (CHRONULAC) 10 GM/15ML solution Take 30 mLs (20 g total) by mouth 2 (two) times daily as needed for moderate constipation. Patient not taking: Reported on 06/09/2021 12/02/20   Lavina Hamman, MD  losartan (COZAAR) 50 MG tablet Take 50 mg by mouth daily. 06/29/21   [provider]  magnesium oxide (MAG-OX) 400 MG tablet Take 400 mg by mouth daily. 03/21/21 03/21/22  [provider]  metolazone (ZAROXOLYN) 2.5 MG tablet Take 1 tablet (2.5 mg total) by mouth once a week. Take it 1/2 hour before morning furosemide Take 1 extra  potassium on the day you take this Patient not taking: Reported on 06/09/2021 12/28/20   Alisa Graff, FNP  metoprolol tartrate (LOPRESSOR) 25 MG tablet Take 25 mg by mouth 2 (two) times daily.    [provider]  montelukast (SINGULAIR) 10 MG tablet Take 10 mg by mouth at bedtime.    [provider]  nitroGLYCERIN (NITROSTAT) 0.4 MG SL tablet Place under the tongue. 03/20/21   [provider]  pantoprazole (PROTONIX) 40 MG tablet Take 1 tablet (40 mg total) by mouth 2 (two) times daily. 12/02/20   Lavina Hamman, MD  phenazopyridine (PYRIDIUM) 200 MG tablet Take 1 tablet (200 mg total) by mouth 3 (three) times daily as needed (bladder pain). 09/28/20   Pokhrel, Corrie Mckusick, MD  polyethylene glycol (MIRALAX / GLYCOLAX) 17 g packet Take 17 g by mouth daily as needed for mild constipation or moderate constipation. Patient not taking: Reported on 06/09/2021 09/28/20   Pokhrel, Corrie Mckusick, MD  potassium chloride SA (KLOR-CON M) 20 MEQ tablet Take 1  tablet (20 mEq total) by mouth daily. 12/18/20   Alisa Graff, FNP  ranolazine (RANEXA) 1000 MG SR tablet Take 1,000 mg by mouth 2 (two) times daily. 05/31/21   [provider]  roflumilast (DALIRESP) 500 MCG TABS tablet Take 1 tablet (500 mcg total) by mouth daily. Patient not taking: Reported on 07/23/2021 06/08/21   Flora Lipps, MD  tamsulosin (FLOMAX) 0.4 MG CAPS capsule TAKE 1 CAPSULE BY MOUTH EVERY DAY 11/09/20   Billey Co, MD  torsemide (DEMADEX) 20 MG tablet Take 3 tablets (60 mg total) by mouth daily. 06/01/21   Alisa Graff, FNP    Physical Exam   Triage Vital Signs: ED Triage Vitals  Enc Vitals Group     BP 08/06/21 2337 130/63     Pulse Rate 08/06/21 2337 82     Resp 08/06/21 2337 17     Temp 08/06/21 2337 97.7 F (36.5 C)     Temp Source 08/06/21 2337 Oral     SpO2 08/06/21 2337 94 %     Weight 08/06/21 2347 175 lb (79.4 kg)     Height 08/06/21 2347 '6\' 2"'$  (1.88 m)     Head Circumference --      Peak Flow --      Pain Score 08/06/21 2346 5     Pain Loc --      Pain Edu? --      Excl. in [place]? --     Most recent vital signs: Vitals:   08/06/21 2337 08/07/21 0251  BP: 130/63 125/62  Pulse: 82 86  Resp: 17 18  Temp: 97.7 F (36.5 C) 97.8 F (36.6 C)  SpO2: 94% 95%    CONSTITUTIONAL: Alert and oriented and responds appropriately to questions. Well-appearing; well-nourished, elderly HEAD: Normocephalic, atraumatic EYES: Conjunctivae clear, pupils appear equal, sclera nonicteric ENT: normal nose; moist mucous membranes NECK: Supple, normal ROM CARD: RRR; S1 and S2 appreciated; no murmurs, no clicks, no rubs, no gallops RESP: Normal chest excursion without splinting or tachypnea; breath sounds clear and equal bilaterally; no wheezes, no rhonchi, no rales, no hypoxia or respiratory distress, speaking full sentences ABD/GI: Normal bowel sounds; non-distended; soft, non-tender, no rebound, no guarding, no peritoneal signs BACK: The back appears  normal, no CVA tenderness EXT: Normal ROM in all joints; no deformity noted, no edema; no cyanosis SKIN: Normal color for age and race; warm; no rash on exposed skin NEURO: Moves all extremities equally,  normal speech PSYCH: The patient's mood and manner are appropriate.   ED Results / Procedures / Treatments   LABS: (all labs ordered are listed, but only abnormal results are displayed) Labs Reviewed  URINALYSIS, ROUTINE W REFLEX MICROSCOPIC - Abnormal; Notable for the following components:      Result Value   Color, Urine ORANGE (*)    APPearance TURBID (*)    Glucose, UA   (*)    Value: TEST NOT REPORTED DUE TO COLOR INTERFERENCE OF URINE PIGMENT   Hgb urine dipstick   (*)    Value: TEST NOT REPORTED DUE TO COLOR INTERFERENCE OF URINE PIGMENT   Bilirubin Urine   (*)    Value: TEST NOT REPORTED DUE TO COLOR INTERFERENCE OF URINE PIGMENT   Ketones, ur   (*)    Value: TEST NOT REPORTED DUE TO COLOR INTERFERENCE OF URINE PIGMENT   Protein, ur   (*)    Value: TEST NOT REPORTED DUE TO COLOR INTERFERENCE OF URINE PIGMENT   Nitrite   (*)    Value: TEST NOT REPORTED DUE TO COLOR INTERFERENCE OF URINE PIGMENT   Leukocytes,Ua   (*)    Value: TEST NOT REPORTED DUE TO COLOR INTERFERENCE OF URINE PIGMENT   WBC, UA >50 (*)    Bacteria, UA MANY (*)    All other components within normal limits  BASIC METABOLIC PANEL - Abnormal; Notable for the following components:   Glucose, Bld 110 (*)    BUN 26 (*)    Creatinine, Ser 1.49 (*)    GFR, Estimated 47 (*)    All other components within normal limits  CBC - Abnormal; Notable for the following components:   RBC 3.94 (*)    Hemoglobin 11.6 (*)    HCT 36.0 (*)    All other components within normal limits  URINE CULTURE     EKG:   RADIOLOGY: My personal review and interpretation of imaging: CT scan shows no kidney stone, hydronephrosis, pyelonephritis.  I have personally reviewed all radiology reports.   CT Renal Stone  Study  Result Date: 08/07/2021 CLINICAL DATA:  Flank pain EXAM: CT ABDOMEN AND PELVIS WITHOUT CONTRAST TECHNIQUE: Multidetector CT imaging of the abdomen and pelvis was performed following the standard protocol without IV contrast. RADIATION DOSE REDUCTION: This exam was performed according to the departmental dose-optimization program which includes automated exposure control, adjustment of the mA and/or kV according to patient size and/or use of iterative reconstruction technique. COMPARISON:  CT pelvis dated 07/20/2021. CT abdomen/pelvis dated 11/03/2020. FINDINGS: Lower chest: Emphysematous changes at the lung bases. Stable subpleural scarring inferiorly in the right middle lobe. Hepatobiliary: Unenhanced liver is unremarkable. Gallbladder is unremarkable. No intrahepatic or extrahepatic ductal dilatation. Pancreas: Within normal limits. Spleen: Calcified splenic granulomata. Adrenals/Urinary Tract: Adrenal glands are within normal limits. Bilateral renal parenchymal atrophy. Multiple nonobstructing left lower pole renal calculi measuring up to 11 mm (series 4/image 45). No ureteral calculi or hydronephrosis. Bladder is within normal limits. Stomach/Bowel: Stomach is within normal limits. No evidence of bowel obstruction. Normal appendix (series 4/image 58). Extensive left colonic diverticulosis, without convincing pericolonic inflammatory changes to suggest acute diverticulitis. Vascular/Lymphatic: No evidence of abdominal aortic aneurysm. Atherosclerotic calcifications of the abdominal aorta and branch vessels. No suspicious abdominopelvic lymphadenopathy. Reproductive: Prostatomegaly, suggesting BPH. Other: No abdominopelvic ascites. Tiny fat containing left inguinal hernia. Small fat containing right inguinal hernia. Musculoskeletal: Degenerative changes of the lumbar spine. IMPRESSION: Multiple nonobstructing left lower pole renal calculi measuring up to 11  mm. No ureteral calculi or hydronephrosis.  Prostatomegaly, suggesting BPH. Extensive left colonic diverticulosis, without evidence of diverticulitis. Electronically Signed   By: Julian Hy M.D.   On: 08/07/2021 01:24     PROCEDURES:  Critical Care performed: No     Procedures    IMPRESSION / MDM / ASSESSMENT AND PLAN / ED COURSE  I reviewed the triage vital signs and the nursing notes.    Patient here with complaints of dysuria, urinary frequency and urgency and lower back pain.  History of recurrent UTIs.  Multiple cultures have grown E. coli.     DIFFERENTIAL DIAGNOSIS (includes but not limited to):   UTI, pyelonephritis, kidney stone, doubt appendicitis, diverticulitis, colitis, bowel obstruction   Patient's presentation is most consistent with acute presentation with potential threat to life or bodily function.   PLAN: We will obtain CBC, BMP, urinalysis, urine culture, CT renal study.  We will give Tylenol.  States he drove himself to the emergency department.  He is afebrile and nontoxic-appearing here.   MEDICATIONS GIVEN IN ED: Medications  piperacillin-tazobactam (ZOSYN) IVPB 3.375 g (0 g Intravenous Stopped 08/07/21 0206)  acetaminophen (TYLENOL) tablet 1,000 mg (1,000 mg Oral Given 08/07/21 0136)     ED COURSE: Patient's labs show no leukocytosis, improved hemoglobin compared to previous.  Chronic kidney disease which is stable.  Normal electrolytes.  Urine does appear grossly infected today.  Culture pending.  CT scan reviewed and interpreted by myself and radiology and shows multiple stones within the left kidney but no ureterolithiasis, hydronephrosis.  No other acute abnormality seen.  He has diverticulosis without diverticulitis.  Normal-appearing appendix.  E. coli is resistant to multiple medications but sensitive to Zosyn which she has received here in the emergency department as well as Augmentin.  Given he is extremely well-appearing without signs or symptoms of sepsis, hemodynamically stable,  tolerating p.o., I feel it is reasonable to discharge him on a course of antibiotics as an outpatient.  Patient is comfortable with this plan as well.  Will discharge on Augmentin.  He has a PCP for close follow-up.   At this time, I do not feel there is any life-threatening condition present. I reviewed all nursing notes, vitals, pertinent previous records.  All lab and urine results, EKGs, imaging ordered have been independently reviewed and interpreted by myself.  I reviewed all available radiology reports from any imaging ordered this visit.  Based on my assessment, I feel the patient is safe to be discharged home without further emergent workup and can continue workup as an outpatient as needed. Discussed all findings, treatment plan as well as usual and customary return precautions.  They verbalize understanding and are comfortable with this plan.  Outpatient follow-up has been provided as needed.  All questions have been answered.    CONSULTS: Admission considered but given patient is well-appearing, nontoxic with reassuring vital signs, tolerating p.o. without acute complications from UTI feel he is safe for discharge with further outpatient management.   OUTSIDE RECORDS REVIEWED: Reviewed patient's last admission at [facility] on 03/26/2021.       FINAL CLINICAL IMPRESSION(S) / ED DIAGNOSES   Final diagnoses:  Acute UTI     Rx / DC Orders   ED Discharge Orders          Ordered    amoxicillin-clavulanate (AUGMENTIN) 875-125 MG tablet  2 times daily        08/07/21 0233    ondansetron (ZOFRAN-ODT) 4 MG disintegrating tablet  Every 6  hours PRN        08/07/21 0233             Note:  This document was prepared using Dragon voice recognition software and may include unintentional dictation errors.   Artemis Koller, Delice Bison, DO 08/07/21 [phone]

## 2021-08-07 NOTE — ED Notes (Signed)
Bladder scan 49m

## 2021-08-07 NOTE — Discharge Instructions (Addendum)
You may take Tylenol 1000 mg every 6 hours as needed for pain. °

## 2021-08-09 LAB — URINE CULTURE: Culture: >=100000 — AB

## 2021-08-14 DIAGNOSIS — J479 Bronchiectasis, uncomplicated: Secondary | ICD-10-CM | POA: Diagnosis not present

## 2021-08-19 ENCOUNTER — Ambulatory Visit: Payer: Medicare HMO | Admitting: Urology

## 2021-08-19 ENCOUNTER — Encounter: Payer: Self-pay | Admitting: Urology

## 2021-08-19 ENCOUNTER — Ambulatory Visit (INDEPENDENT_AMBULATORY_CARE_PROVIDER_SITE_OTHER): Payer: Medicare HMO | Admitting: Urology

## 2021-08-19 VITALS — BP 109/69 | HR 80 | Ht 74.0 in | Wt 173.0 lb

## 2021-08-19 DIAGNOSIS — R3912 Poor urinary stream: Secondary | ICD-10-CM

## 2021-08-19 DIAGNOSIS — R300 Dysuria: Secondary | ICD-10-CM

## 2021-08-19 DIAGNOSIS — N200 Calculus of kidney: Secondary | ICD-10-CM | POA: Diagnosis not present

## 2021-08-19 DIAGNOSIS — Z87440 Personal history of urinary (tract) infections: Secondary | ICD-10-CM | POA: Diagnosis not present

## 2021-08-19 LAB — URINALYSIS, COMPLETE
Bilirubin, UA: NEGATIVE
Glucose, UA: NEGATIVE
Ketones, UA: NEGATIVE
Nitrite, UA: NEGATIVE
Protein,UA: NEGATIVE
Specific Gravity, UA: 1.015 (ref 1.005–1.030)
Urobilinogen, Ur: 0.2 mg/dL (ref 0.2–1.0)
pH, UA: 7.5 (ref 5.0–7.5)

## 2021-08-19 LAB — MICROSCOPIC EXAMINATION: WBC, UA: >30 /hpf — AB (ref 0–5)

## 2021-08-19 MED ORDER — NITROFURANTOIN MONOHYD MACRO 100 MG PO CAPS: 100.0000 mg | ORAL_CAPSULE | Freq: Two times a day (BID) | ORAL | 0 refills | Status: DC

## 2021-08-19 NOTE — Progress Notes (Signed)
Cystoscopy Procedure Note:  Indication: Dysuria, weak stream, recurrent infections/chronic colonization  After informed consent and discussion of the procedure and its risks, WRANGLER PENNING was positioned and prepped in the standard fashion. Cystoscopy was performed with a flexible cystoscope. The urethra, bladder neck and entire bladder was visualized in a standard fashion. The prostate was moderate in size with a high bladder neck. The ureteral orifices were visualized in their normal location and orientation.  Urine was cloudy and was irrigated free.  No suspicious lesions, no abnormalities on retroflexion.  Imaging: I viewed the recent CT pelvis and recent CT stone protocol from July 2023, prostate measures 62 g, no hydronephrosis, non-obstructive left lower pole stones, normal-appearing bladder  Findings: Enlarged prostate, no bladder abnormalities  -----------------------------------------------------------------------------------------  Assessment and Plan: Very comorbid 81 year old male with A-fib on apixaban, CHF, history of DVT, hypertension, COPD on chronic oxygen who has had a complex urologic history over the last year.  Originally presented 09/18/2020 with a 8 mm left proximal ureteral stone and hydronephrosis, and ultimately underwent left ureteral stent placement with Dr. Abner Greenspan for refractory left renal colic.  He had not had any UTIs or significant urologic problems prior to that hospitalization.  He then underwent left ureteroscopy, laser lithotripsy, and stent placement with me on 10/16/2020 with dusting of all left-sided stones.  These were very hard and felt to be consistent with calcium oxalate monohydrate stones.  He has had numerous MDR UTIs and positive culture since that time and treated with a number of antibiotics both with our clinic, [facility], and with infectious disease.  He is a very difficult historian and is difficult to tell if he has had improvement on antibiotics, or if  this represents chronic colonization.  From reviewing my prior notes, it sounds like originally he had some improvement after 2 weeks of nitrofurantoin, but symptoms recurred a few days later.  Very difficult to establish if his symptoms are related to BPH, recurrent UTIs, prostatitis, or other unclear etiology.  We again reviewed options at length.  Unfortunately with his comorbidities he is very frail and is not a good surgical candidate to address either the nonobstructing left-sided lower pole stones, or BPH.  I think it is reasonable for one final effort with a month-long course of nitrofurantoin since he has had improvement on that medication previously to see if we can clear this persistent infection prior to considering surgery, as he has very high risk based on his CHF and COPD.  If persistent urinary symptoms of dysuria and lower abdominal pain despite 1 month course of nitrofurantoin, would consider HOLEP for removal of suspected chronically infected prostate tissue as well as relief of any outlet obstruction related to BPH.  We also could consider simultaneous left ureteroscopy and laser lithotripsy and stent placement of small left-sided nonobstructive stones, but this would increase the risk of potential postoperative complications or sepsis.  4 weeks nitrofurantoin RTC 6 weeks symptom check, if persistent symptoms likely pursue HOLEP  I spent 45 total minutes on the day of the encounter including pre-visit review of the medical record, face-to-face time with the patient, and post visit ordering of labs/imaging/tests.   Nickolas Madrid, MD 08/19/2021

## 2021-08-19 NOTE — Patient Instructions (Signed)

## 2021-08-23 ENCOUNTER — Ambulatory Visit (INDEPENDENT_AMBULATORY_CARE_PROVIDER_SITE_OTHER): Payer: Medicare HMO | Admitting: Urology

## 2021-08-23 VITALS — Temp 98.3°F

## 2021-08-23 DIAGNOSIS — R300 Dysuria: Secondary | ICD-10-CM

## 2021-08-23 DIAGNOSIS — R339 Retention of urine, unspecified: Secondary | ICD-10-CM

## 2021-08-23 DIAGNOSIS — Z87440 Personal history of urinary (tract) infections: Secondary | ICD-10-CM

## 2021-08-23 LAB — BLADDER SCAN AMB NON-IMAGING

## 2021-08-23 MED ORDER — AMOXICILLIN-POT CLAVULANATE 875-125 MG PO TABS: 1.0000 | ORAL_TABLET | Freq: Two times a day (BID) | ORAL | 0 refills | Status: DC

## 2021-08-23 NOTE — Progress Notes (Signed)
   08/23/2021 4:41 PM   [name] [birth year] [id number]  Reason for visit: Lower urinary tract symptoms, dysuria, nausea  HPI: Very comorbid 81 year old male with A-fib on apixaban, CHF, history of DVT, hypertension, COPD on chronic oxygen who has had a complex urologic history over the last year.  Originally presented 09/18/2020 with a 8 mm left proximal ureteral stone and hydronephrosis, and ultimately underwent left ureteral stent placement with Dr. Abner Greenspan for refractory left renal colic.  He had not had any UTIs or significant urologic problems prior to that hospitalization.  He then underwent left ureteroscopy, laser lithotripsy, and stent placement with me on 10/16/2020 with dusting of all left-sided stones.  These were very hard and felt to be consistent with calcium oxalate monohydrate stones.   He has had numerous MDR UTIs and positive culture since that time and treated with a number of antibiotics both with our clinic, [facility], and with infectious disease.  He is a very difficult historian and is difficult to tell if he has had improvement on antibiotics, or if this represents chronic colonization.  From reviewing my prior notes, it sounds like originally he had some improvement after 2 weeks of nitrofurantoin, but symptoms recurred a few days later.  On 08/19/2021 he underwent cystoscopy in clinic to evaluate for other possible etiologies of his dysuria and recurrent infections, and cystoscopy showed an enlarged prostate but was otherwise benign.  At that visit we recommended a 1 month course of nitrofurantoin for possible prostatitis, and consider HOLEP in the future if no improvement.  He was added on to clinic today for possible urinary retention with increased dysuria and nausea over the last few days.  PVR is normal at 48m.  I had a frank conversation with the patient and his family member that I do not have a great answer for his nausea and ongoing dysuria.  Based on his prior cultures,  I think it would not be unreasonable to add Augmentin to the nitrofurantoin if this is a possible prostatitis, but has been very challenging to elucidate the etiology of his symptoms.  I again personally viewed and interpreted the CT pelvis with contrast from 07/22/2021 and CT stone protocol dated 08/07/2021 that showed no hydronephrosis or stones, none distended bladder, no other abscess or other etiology of his pain.  Augmentin twice daily x14 days added to nitrofurantoin for possible prostatitis Keep 4-week follow-up for symptom check Consider HOLEP in the future in the setting of his ongoing infections/prostatitis/dysuria     [place] [place] [phone]

## 2021-08-26 ENCOUNTER — Other Ambulatory Visit: Payer: Self-pay | Admitting: Internal Medicine

## 2021-08-26 DIAGNOSIS — R079 Chest pain, unspecified: Secondary | ICD-10-CM

## 2021-09-14 DIAGNOSIS — J479 Bronchiectasis, uncomplicated: Secondary | ICD-10-CM | POA: Diagnosis not present

## 2021-09-15 ENCOUNTER — Other Ambulatory Visit: Payer: Self-pay | Admitting: Family

## 2021-09-20 ENCOUNTER — Other Ambulatory Visit: Payer: Self-pay | Admitting: Internal Medicine

## 2021-09-30 ENCOUNTER — Encounter: Payer: Self-pay | Admitting: Urology

## 2021-09-30 ENCOUNTER — Ambulatory Visit (INDEPENDENT_AMBULATORY_CARE_PROVIDER_SITE_OTHER): Payer: Medicare HMO | Admitting: Urology

## 2021-09-30 VITALS — BP 106/61 | HR 83 | Ht 74.0 in | Wt 175.0 lb

## 2021-09-30 DIAGNOSIS — R300 Dysuria: Secondary | ICD-10-CM

## 2021-09-30 DIAGNOSIS — R339 Retention of urine, unspecified: Secondary | ICD-10-CM | POA: Diagnosis not present

## 2021-09-30 LAB — BLADDER SCAN AMB NON-IMAGING: Scan Result: 5

## 2021-09-30 MED ORDER — NITROFURANTOIN MONOHYD MACRO 100 MG PO CAPS: 100.0000 mg | ORAL_CAPSULE | Freq: Two times a day (BID) | ORAL | 0 refills | Status: AC

## 2021-09-30 NOTE — Progress Notes (Signed)
   09/30/2021 1:09 PM   [name] [birth year] [id number]  Reason for visit: Follow up recurrent UTIs, LUTS, dysuria/nausea  HPI: Very comorbid 81 year old male with A-fib on apixaban, CHF, history of DVT, hypertension, COPD on chronic oxygen who has had a complex urologic history over the last year.  Originally presented 09/18/2020 with a 8 mm left proximal ureteral stone and hydronephrosis, and ultimately underwent left ureteral stent placement with Dr. Abner Greenspan for refractory left renal colic.  He had not had any UTIs or significant urologic problems prior to that hospitalization.  He then underwent left ureteroscopy, laser lithotripsy, and stent placement with me on 10/16/2020 with dusting of all left-sided stones.  These were very hard and felt to be consistent with calcium oxalate monohydrate stones.   He has had numerous MDR UTIs and positive culture since that time and treated with a number of antibiotics both with our clinic, [facility], and with infectious disease.  He is a very difficult historian and is difficult to tell if he has had improvement on antibiotics, or if this represents chronic colonization.  From reviewing my prior notes, it sounds like originally he had some improvement after 2 weeks of nitrofurantoin, but symptoms recurred a few days later.  He underwent cystoscopy on 08/19/2021 that showed an enlarged prostate(62 g on prior CT) but no other abnormalities.  We opted for a 1 month course of nitrofurantoin and Augmentin for possible prostatitis at that visit, in the setting of his high risk for any surgery with his comorbidities.  Unfortunately he discontinued those medications after a few days secondary to nausea.  It sounds like he is still having a fair amount of intermittent nausea, and he thinks this is worse when he eats certain foods, including ketchup.  Urinalysis today not surprisingly appears infected with greater than 30 WBCs, 3-10 RBCs, few bacteria, no yeast, dipstick with  color interference.  Will send for culture and atypicals.  I again stressed the importance of completing a full month of culture appropriate antibiotics prior to considering surgery with HOLEP secondary to his numerous comorbidities, high risk for any surgery, and risk for persistent incontinence after surgery, as well as ongoing symptoms if this is not secondary to prostate colonization.  His primary complaints today are ongoing dysuria, and urinary frequency, but he is on high-dose torsemide which likely is contributing to the urinary frequency.  -Nitrofurantoin 100 mg twice daily x30 days -Urine sent for atypical cultures and standard culture -RTC 1 month symptom check-consider HOLEP if unable to clear bacteria with month of nitrofurantoin, only other option would be a month-long course of IV antibiotics through infectious disease(previously failed a 7-day course of IV or ertapenem x7 days   Billey Co, MD  [facility] [address] [place], [place] [address] [phone]

## 2021-10-01 LAB — URINALYSIS, COMPLETE

## 2021-10-01 LAB — MICROSCOPIC EXAMINATION: WBC, UA: >30 /hpf — AB (ref 0–5)

## 2021-10-04 DIAGNOSIS — I2510 Atherosclerotic heart disease of native coronary artery without angina pectoris: Secondary | ICD-10-CM | POA: Diagnosis not present

## 2021-10-04 DIAGNOSIS — J449 Chronic obstructive pulmonary disease, unspecified: Secondary | ICD-10-CM | POA: Diagnosis not present

## 2021-10-04 DIAGNOSIS — I4891 Unspecified atrial fibrillation: Secondary | ICD-10-CM | POA: Diagnosis not present

## 2021-10-04 DIAGNOSIS — I10 Essential (primary) hypertension: Secondary | ICD-10-CM | POA: Diagnosis not present

## 2021-10-04 DIAGNOSIS — I340 Nonrheumatic mitral (valve) insufficiency: Secondary | ICD-10-CM | POA: Diagnosis not present

## 2021-10-04 DIAGNOSIS — E782 Mixed hyperlipidemia: Secondary | ICD-10-CM | POA: Diagnosis not present

## 2021-10-04 DIAGNOSIS — K219 Gastro-esophageal reflux disease without esophagitis: Secondary | ICD-10-CM | POA: Diagnosis not present

## 2021-10-04 LAB — CULTURE, URINE COMPREHENSIVE

## 2021-10-07 LAB — MYCOPLASMA / UREAPLASMA CULTURE
Mycoplasma hominis Culture: NEGATIVE
Ureaplasma urealyticum: NEGATIVE

## 2021-10-14 DIAGNOSIS — J479 Bronchiectasis, uncomplicated: Secondary | ICD-10-CM | POA: Diagnosis not present

## 2021-10-28 DIAGNOSIS — J159 Unspecified bacterial pneumonia: Secondary | ICD-10-CM | POA: Diagnosis not present

## 2021-11-02 ENCOUNTER — Ambulatory Visit: Payer: Medicare HMO

## 2021-11-02 ENCOUNTER — Other Ambulatory Visit: Payer: Self-pay | Admitting: *Deleted

## 2021-11-02 DIAGNOSIS — N390 Urinary tract infection, site not specified: Secondary | ICD-10-CM | POA: Diagnosis not present

## 2021-11-02 LAB — MICROSCOPIC EXAMINATION: WBC, UA: >30 /hpf — AB (ref 0–5)

## 2021-11-02 LAB — URINALYSIS, COMPLETE
Bilirubin, UA: NEGATIVE
Glucose, UA: NEGATIVE
Ketones, UA: NEGATIVE
Nitrite, UA: NEGATIVE
Protein,UA: NEGATIVE
Specific Gravity, UA: 1.015 (ref 1.005–1.030)
Urobilinogen, Ur: 0.2 mg/dL (ref 0.2–1.0)
pH, UA: 7 (ref 5.0–7.5)

## 2021-11-03 ENCOUNTER — Other Ambulatory Visit: Payer: Self-pay | Admitting: Family

## 2021-11-03 ENCOUNTER — Other Ambulatory Visit: Payer: Self-pay | Admitting: Urology

## 2021-11-03 LAB — CBC WITH DIFFERENTIAL
Basophils Absolute: 0 10*3/uL (ref 0.0–0.2)
Basos: 1 %
EOS (ABSOLUTE): 0.2 10*3/uL (ref 0.0–0.4)
Eos: 3 %
Hematocrit: 35.2 % — ABNORMAL LOW (ref 37.5–51.0)
Hemoglobin: 11.6 g/dL — ABNORMAL LOW (ref 13.0–17.7)
Immature Grans (Abs): 0 10*3/uL (ref 0.0–0.1)
Immature Granulocytes: 0 %
Lymphocytes Absolute: 1.1 10*3/uL (ref 0.7–3.1)
Lymphs: 18 %
MCH: 29.5 pg (ref 26.6–33.0)
MCHC: 33 g/dL (ref 31.5–35.7)
MCV: 90 fL (ref 79–97)
Monocytes Absolute: 0.6 10*3/uL (ref 0.1–0.9)
Monocytes: 10 %
Neutrophils Absolute: 4.5 10*3/uL (ref 1.4–7.0)
Neutrophils: 68 %
RBC: 3.93 x10E6/uL — ABNORMAL LOW (ref 4.14–5.80)
RDW: 12.7 % (ref 11.6–15.4)
WBC: 6.4 10*3/uL (ref 3.4–10.8)

## 2021-11-03 LAB — COMPREHENSIVE METABOLIC PANEL
ALT: 13 IU/L (ref 0–44)
AST: 24 IU/L (ref 0–40)
Albumin/Globulin Ratio: 1.7 (ref 1.2–2.2)
Albumin: 4.7 g/dL (ref 3.8–4.8)
Alkaline Phosphatase: 164 IU/L — ABNORMAL HIGH (ref 44–121)
BUN/Creatinine Ratio: 16 (ref 10–24)
BUN: 20 mg/dL (ref 8–27)
Bilirubin Total: 0.5 mg/dL (ref 0.0–1.2)
CO2: 27 mmol/L (ref 20–29)
Calcium: 9.6 mg/dL (ref 8.6–10.2)
Chloride: 101 mmol/L (ref 96–106)
Creatinine, Ser: 1.29 mg/dL — ABNORMAL HIGH (ref 0.76–1.27)
Globulin, Total: 2.8 g/dL (ref 1.5–4.5)
Glucose: 92 mg/dL (ref 70–99)
Potassium: 5 mmol/L (ref 3.5–5.2)
Sodium: 142 mmol/L (ref 134–144)
Total Protein: 7.5 g/dL (ref 6.0–8.5)
eGFR: 56 mL/min/{1.73_m2} — ABNORMAL LOW (ref >59)

## 2021-11-04 ENCOUNTER — Ambulatory Visit (INDEPENDENT_AMBULATORY_CARE_PROVIDER_SITE_OTHER): Payer: Medicare HMO | Admitting: Urology

## 2021-11-04 ENCOUNTER — Encounter: Payer: Self-pay | Admitting: Urology

## 2021-11-04 VITALS — BP 118/63 | HR 64 | Ht 74.0 in | Wt 176.4 lb

## 2021-11-04 DIAGNOSIS — N410 Acute prostatitis: Secondary | ICD-10-CM | POA: Diagnosis not present

## 2021-11-04 DIAGNOSIS — N390 Urinary tract infection, site not specified: Secondary | ICD-10-CM

## 2021-11-04 NOTE — Progress Notes (Addendum)
   11/04/2021 1:08 PM   [name] 1940/03/23 [id number]  Reason for visit: Follow up recurrent UTIs, LUTS, dysuria/nausea  HPI: Very comorbid 81 year old male with A-fib on apixaban, CHF, history of DVT, hypertension, COPD on chronic oxygen who has had a complex urologic history over the last year.  Originally presented 09/18/2020 with a 8 mm left proximal ureteral stone and hydronephrosis, and ultimately underwent left ureteral stent placement with Dr. Abner Greenspan for refractory left renal colic.  He had not had any UTIs or significant urologic problems prior to that hospitalization.  He then underwent left ureteroscopy, laser lithotripsy, and stent placement with me on 10/16/2020 with dusting of all left-sided stones.  These were very hard and felt to be consistent with calcium oxalate monohydrate stones.   He has had numerous MDR UTIs and positive culture since that time and treated with a number of antibiotics both with our clinic, [facility], and with infectious disease.  He is a very difficult historian and is difficult to tell if he has had improvement on antibiotics, or if this represents chronic colonization.  From reviewing my prior notes, it sounds like originally he had some improvement after 2 weeks of nitrofurantoin, but symptoms recurred a few days later.  He underwent cystoscopy on 08/19/2021 that showed an enlarged prostate(62 g on prior CT) but no other abnormalities.  We opted for a 1 month course of nitrofurantoin and Augmentin for possible prostatitis at that visit, in the setting of his high risk for any surgery with his comorbidities.  Unfortunately he discontinued those medications after a few days secondary to nausea.  We tried to change him to just a 1 month course of nitrofurantoin, but he reports even this caused severe nausea and vomiting and he discontinued that medication after just a few days.  He continues to report dysuria and urgency and urge incontinence, as well as nocturia  3-4 times at night.  Some of his frequency is explained by his high-dose diuretic, but ongoing dysuria and urge/urge incontinence consistent with infection.  I again stressed the importance of completing a full month of culture appropriate antibiotics prior to considering surgery with HOLEP secondary to his numerous comorbidities, high risk for any surgery, and risk for persistent incontinence after surgery, as well as ongoing symptoms if this is not secondary to prostate colonization.   I had a long phone conversation with Dr. Steva Ready with infectious disease today regarding antibiotic options.  She is very hesitant to move forward with 3 to 4 weeks of an IV antibiotic.  After a long discussion we ultimately opted for a trial of gentamicin bladder irrigations, and we will set that up in clinic.  If persistent infection despite bladder irrigations with gentamicin, may need to consider IV antibiotic.  Again, very hesitant to pursue any surgical intervention with HOLEP or ureteroscopy, as very high risk surgical candidate with high risk for incontinence, and no guarantee for improvement in his infections or dysuria.  -Will coordinate gentamicin bladder irrigations with PA -RTC 8 weeks symptom check and UA   Billey Co, MD  [facility] [address], [place], [place] [address] [phone]

## 2021-11-05 LAB — CULTURE, URINE COMPREHENSIVE

## 2021-11-09 ENCOUNTER — Ambulatory Visit (INDEPENDENT_AMBULATORY_CARE_PROVIDER_SITE_OTHER): Payer: Medicare HMO | Admitting: Urology

## 2021-11-09 ENCOUNTER — Encounter: Payer: Self-pay | Admitting: Urology

## 2021-11-09 VITALS — BP 131/73 | HR 0 | Ht 74.0 in | Wt 176.0 lb

## 2021-11-09 DIAGNOSIS — N390 Urinary tract infection, site not specified: Secondary | ICD-10-CM

## 2021-11-09 DIAGNOSIS — N410 Acute prostatitis: Secondary | ICD-10-CM

## 2021-11-09 MED ORDER — SODIUM CHLORIDE 0.9 % IV SOLN
80.0000 mg | Freq: Once | INTRAVENOUS | Status: AC
  Administered 2021-11-09: 80 mg

## 2021-11-09 NOTE — Progress Notes (Signed)
Bladder Instillation  Due to prostatitis patient is present today for a Bladder Instillation of gentamicin. Patient was cleaned and prepped in a sterile fashion with betadine and lidocaine 2% jelly was instilled into the urethra.  A 14 FR catheter was inserted, urine return was noted 100 ml, urine was yellow clear in color.  60 ml was instilled into the bladder. The catheter was then removed. Patient tolerated well, no complications were noted. Patient held in bladder for 30 minutes prior to procedure starting.   Performed by: [name], PA-C and Norton Blizzard, CMA  Follow up/ Additional notes: RTC Thursday for next injection

## 2021-11-10 NOTE — Progress Notes (Unsigned)
Bladder Instillation  Due to rUTI patient is present today for a Bladder Instillation of Gentamicin. Patient was cleaned and prepped in a sterile fashion with betadine and lidocaine 2% jelly was instilled into the urethra.  A 14 FR catheter was inserted, urine return was noted 100 ml, urine was yellow clear in color.  60 ml was instilled into the bladder. The catheter was then removed. Patient tolerated well, no complications were noted. Patient held in bladder for 30 minutes prior to procedure starting.   Performed by: [name], PA-C and Evelina Bucy, CMA   Follow up/ Additional notes: next week

## 2021-11-11 ENCOUNTER — Ambulatory Visit (INDEPENDENT_AMBULATORY_CARE_PROVIDER_SITE_OTHER): Payer: Medicare HMO | Admitting: Urology

## 2021-11-11 DIAGNOSIS — N390 Urinary tract infection, site not specified: Secondary | ICD-10-CM

## 2021-11-11 MED ORDER — GENTAMICIN SULFATE 40 MG/ML IJ SOLN
80.0000 mg | Freq: Once | INTRAMUSCULAR | Status: AC
  Administered 2021-11-11: 80 mg via INTRAMUSCULAR

## 2021-11-11 NOTE — Addendum Note (Signed)
Addended by: Evelina Bucy on: 11/11/2021 03:18 PM   Modules accepted: Orders

## 2021-11-14 DIAGNOSIS — J479 Bronchiectasis, uncomplicated: Secondary | ICD-10-CM | POA: Diagnosis not present

## 2021-11-15 ENCOUNTER — Encounter: Payer: Self-pay | Admitting: Physician Assistant

## 2021-11-15 ENCOUNTER — Ambulatory Visit (INDEPENDENT_AMBULATORY_CARE_PROVIDER_SITE_OTHER): Payer: Medicare HMO | Admitting: Physician Assistant

## 2021-11-15 VITALS — BP 149/69 | HR 59 | Ht 74.0 in | Wt 176.0 lb

## 2021-11-15 DIAGNOSIS — N390 Urinary tract infection, site not specified: Secondary | ICD-10-CM | POA: Diagnosis not present

## 2021-11-15 MED ORDER — SODIUM CHLORIDE 0.9 % IV SOLN
80.0000 mg | INTRAVENOUS | Status: AC
  Administered 2021-11-15: 80 mg

## 2021-11-15 NOTE — Progress Notes (Signed)
Bladder Instillation  Due to rUTI patient is present today for a Bladder Instillation of Gentamicin. Patient was cleaned and prepped in a sterile fashion with betadine and lidocaine 2% jelly was instilled into the urethra.  A 14FR catheter was inserted, urine return was noted 123m, urine was yellow in color.  60 ml was instilled into the bladder. The catheter was then removed. Patient tolerated well, no complications were noted. Patient held in bladder for 30 minutes prior to voiding.   Performed by: SDebroah Loop PA-C and TMardelle Matte CMA  Follow up/ Additional notes: 2 days

## 2021-11-16 NOTE — Progress Notes (Unsigned)
Bladder Instillation  Due to rUTI's patient is present today for a Bladder Instillation of gentamicin. Patient was cleaned and prepped in a sterile fashion with betadine and lidocaine 2% jelly was instilled into the urethra.  A 14 FR catheter was inserted, urine return was noted 200 ml, urine was yellow clear in color.  60 ml was instilled into the bladder. The catheter was then removed. Patient tolerated well, no complications were noted. Patient held in bladder for 30 minutes prior to procedure starting.   Performed by: [name], PA-C and Evelina Bucy, CMA   Follow up/ Additional notes: Friday for another gentamicin instillation

## 2021-11-17 ENCOUNTER — Ambulatory Visit (INDEPENDENT_AMBULATORY_CARE_PROVIDER_SITE_OTHER): Payer: Medicare HMO | Admitting: Urology

## 2021-11-17 DIAGNOSIS — N390 Urinary tract infection, site not specified: Secondary | ICD-10-CM

## 2021-11-17 MED ORDER — GENTAMICIN SULFATE 40 MG/ML IJ SOLN
80.0000 mg | Freq: Once | INTRAMUSCULAR | Status: AC
  Administered 2021-11-17: 80 mg

## 2021-11-19 ENCOUNTER — Ambulatory Visit (INDEPENDENT_AMBULATORY_CARE_PROVIDER_SITE_OTHER): Payer: Medicare HMO | Admitting: Urology

## 2021-11-19 DIAGNOSIS — N390 Urinary tract infection, site not specified: Secondary | ICD-10-CM | POA: Diagnosis not present

## 2021-11-19 MED ORDER — GENTAMICIN SULFATE 40 MG/ML IJ SOLN
80.0000 mg | Freq: Once | INTRAMUSCULAR | Status: AC
  Administered 2021-11-19: 80 mg via INTRAMUSCULAR

## 2021-11-19 NOTE — Progress Notes (Signed)
Bladder Instillation  Due to rUTI's patient is present today for a Bladder Instillation of gentamicin. Patient was cleaned and prepped in a sterile fashion with betadine and lidocaine 2% jelly was instilled into the urethra.  A 14 FR catheter was inserted, urine return was noted 150 ml, urine was yellow in color.  60 ml was instilled into the bladder. The catheter was then removed. Patient tolerated well, no complications were noted. Patient held in bladder for 30 minutes prior to procedure starting.   Performed by: [name], PA-C and Evelina Bucy, CMA   Follow up/ Additional notes: Patient states he is not finding any relief from having these bladder gentamicin installations.  He still continues to have intense bladder burning in the evening.  It is also come to our attention that the gentamicin will now be on backorder until December, so we have no more gentamicin to proceed with the installations at this time.  We will go ahead and recheck a urinalysis and a urine culture next week.

## 2021-11-24 ENCOUNTER — Other Ambulatory Visit: Payer: Medicare HMO

## 2021-11-24 DIAGNOSIS — N390 Urinary tract infection, site not specified: Secondary | ICD-10-CM

## 2021-11-24 LAB — URINALYSIS, COMPLETE
Bilirubin, UA: NEGATIVE
Glucose, UA: NEGATIVE
Ketones, UA: NEGATIVE
Nitrite, UA: POSITIVE — AB
Protein,UA: NEGATIVE
Specific Gravity, UA: 1.015 (ref 1.005–1.030)
Urobilinogen, Ur: 0.2 mg/dL (ref 0.2–1.0)
pH, UA: 5.5 (ref 5.0–7.5)

## 2021-11-24 LAB — MICROSCOPIC EXAMINATION: WBC, UA: >30 /hpf — AB (ref 0–5)

## 2021-11-30 ENCOUNTER — Telehealth: Payer: Self-pay

## 2021-11-30 LAB — CULTURE, URINE COMPREHENSIVE

## 2021-11-30 MED ORDER — AMOXICILLIN-POT CLAVULANATE 875-125 MG PO TABS: 1.0000 | ORAL_TABLET | Freq: Two times a day (BID) | ORAL | 0 refills | Status: AC

## 2021-11-30 NOTE — Telephone Encounter (Signed)
-----   Message from Nori Riis, PA-C sent at 11/30/2021  1:57 PM EST ----- Please let [name] Browning know that his urine culture is positive for infection.  I recommend that he start an antibiotic by mouth, but I understand that a lot of the antibiotics caused him to be nauseous.  Can he tolerate Augmentin or Fosfomycin?

## 2021-11-30 NOTE — Telephone Encounter (Signed)
Abx Rx sent to pts pharmacy, pt notified.

## 2021-11-30 NOTE — Addendum Note (Signed)
Addended by: Evelina Bucy on: 11/30/2021 03:22 PM   Modules accepted: Orders

## 2021-11-30 NOTE — Telephone Encounter (Signed)
Pt states he think he can tolerate either one.

## 2021-12-06 DIAGNOSIS — R0602 Shortness of breath: Secondary | ICD-10-CM | POA: Diagnosis not present

## 2021-12-06 DIAGNOSIS — I10 Essential (primary) hypertension: Secondary | ICD-10-CM | POA: Diagnosis not present

## 2021-12-06 DIAGNOSIS — I4891 Unspecified atrial fibrillation: Secondary | ICD-10-CM | POA: Diagnosis not present

## 2021-12-06 DIAGNOSIS — I2510 Atherosclerotic heart disease of native coronary artery without angina pectoris: Secondary | ICD-10-CM | POA: Diagnosis not present

## 2021-12-06 DIAGNOSIS — R079 Chest pain, unspecified: Secondary | ICD-10-CM | POA: Diagnosis not present

## 2021-12-06 DIAGNOSIS — I340 Nonrheumatic mitral (valve) insufficiency: Secondary | ICD-10-CM | POA: Diagnosis not present

## 2021-12-06 DIAGNOSIS — K219 Gastro-esophageal reflux disease without esophagitis: Secondary | ICD-10-CM | POA: Diagnosis not present

## 2021-12-06 DIAGNOSIS — Z9861 Coronary angioplasty status: Secondary | ICD-10-CM | POA: Diagnosis not present

## 2021-12-06 DIAGNOSIS — E782 Mixed hyperlipidemia: Secondary | ICD-10-CM | POA: Diagnosis not present

## 2021-12-09 ENCOUNTER — Ambulatory Visit: Payer: Medicare HMO | Admitting: Urology

## 2021-12-10 DIAGNOSIS — Z20818 Contact with and (suspected) exposure to other bacterial communicable diseases: Secondary | ICD-10-CM | POA: Diagnosis not present

## 2021-12-10 DIAGNOSIS — Z6822 Body mass index (BMI) 22.0-22.9, adult: Secondary | ICD-10-CM | POA: Diagnosis not present

## 2021-12-10 DIAGNOSIS — J441 Chronic obstructive pulmonary disease with (acute) exacerbation: Secondary | ICD-10-CM | POA: Diagnosis not present

## 2021-12-14 DIAGNOSIS — J479 Bronchiectasis, uncomplicated: Secondary | ICD-10-CM | POA: Diagnosis not present

## 2021-12-20 DIAGNOSIS — R079 Chest pain, unspecified: Secondary | ICD-10-CM | POA: Diagnosis not present

## 2021-12-29 ENCOUNTER — Encounter: Payer: Self-pay | Admitting: Urology

## 2021-12-29 ENCOUNTER — Ambulatory Visit (INDEPENDENT_AMBULATORY_CARE_PROVIDER_SITE_OTHER): Payer: Medicare HMO | Admitting: Urology

## 2021-12-29 VITALS — BP 131/64 | HR 85 | Ht 74.0 in | Wt 169.9 lb

## 2021-12-29 DIAGNOSIS — N390 Urinary tract infection, site not specified: Secondary | ICD-10-CM | POA: Diagnosis not present

## 2021-12-29 DIAGNOSIS — R300 Dysuria: Secondary | ICD-10-CM | POA: Diagnosis not present

## 2021-12-29 DIAGNOSIS — E782 Mixed hyperlipidemia: Secondary | ICD-10-CM | POA: Diagnosis not present

## 2021-12-29 DIAGNOSIS — I2510 Atherosclerotic heart disease of native coronary artery without angina pectoris: Secondary | ICD-10-CM | POA: Diagnosis not present

## 2021-12-29 DIAGNOSIS — R0602 Shortness of breath: Secondary | ICD-10-CM | POA: Diagnosis not present

## 2021-12-29 DIAGNOSIS — I10 Essential (primary) hypertension: Secondary | ICD-10-CM | POA: Diagnosis not present

## 2021-12-29 DIAGNOSIS — K219 Gastro-esophageal reflux disease without esophagitis: Secondary | ICD-10-CM | POA: Diagnosis not present

## 2021-12-29 DIAGNOSIS — I340 Nonrheumatic mitral (valve) insufficiency: Secondary | ICD-10-CM | POA: Diagnosis not present

## 2021-12-29 DIAGNOSIS — R079 Chest pain, unspecified: Secondary | ICD-10-CM | POA: Diagnosis not present

## 2021-12-29 DIAGNOSIS — I4891 Unspecified atrial fibrillation: Secondary | ICD-10-CM | POA: Diagnosis not present

## 2021-12-29 LAB — URINALYSIS, COMPLETE
Bilirubin, UA: NEGATIVE
Glucose, UA: NEGATIVE
Ketones, UA: NEGATIVE
Nitrite, UA: NEGATIVE
Protein,UA: NEGATIVE
Specific Gravity, UA: 1.015 (ref 1.005–1.030)
Urobilinogen, Ur: 0.2 mg/dL (ref 0.2–1.0)
pH, UA: 6.5 (ref 5.0–7.5)

## 2021-12-29 LAB — MICROSCOPIC EXAMINATION: WBC, UA: >30 /hpf — AB (ref 0–5)

## 2021-12-29 LAB — BLADDER SCAN AMB NON-IMAGING

## 2021-12-29 MED ORDER — METHENAMINE HIPPURATE 1 G PO TABS: 1.0000 g | ORAL_TABLET | Freq: Two times a day (BID) | ORAL | 3 refills | Status: DC

## 2021-12-29 NOTE — Progress Notes (Signed)
12/29/2021 12:45 PM   [name] Browning 1940-05-23 [id number]  Reason for visit: Follow up recurrent UTIs, LUTS, dysuria/nausea  HPI: Very comorbid 81 year old male with A-fib on apixaban, CHF, history of DVT, hypertension, COPD on chronic oxygen who has had a complex urologic history over the last year.  Originally presented 09/18/2020 with a 8 mm left proximal ureteral stone and hydronephrosis, and ultimately underwent left ureteral stent placement with Dr. Abner Browning for refractory left renal colic.  He had not had any UTIs or significant urologic problems prior to that hospitalization.  He then underwent left ureteroscopy, laser lithotripsy, and stent placement with me on 10/16/2020 with dusting of all left-sided stones.  These were very hard and felt to be consistent with calcium oxalate monohydrate stones.   He has had numerous MDR UTIs and positive culture since that time and treated with a number of antibiotics both with our clinic, [facility], and with infectious disease.  He is a very difficult historian and is difficult to tell if he has had improvement on antibiotics, or if this represents chronic colonization.  From reviewing my prior notes, it sounds like originally he had some improvement after 2 weeks of nitrofurantoin, but symptoms recurred a few days later.  He underwent cystoscopy on 08/19/2021 that showed an enlarged prostate(62 g on prior CT) but no other abnormalities.  We opted for a 1 month course of nitrofurantoin and Augmentin for possible prostatitis at that visit, in the setting of his high risk for any surgery with his comorbidities.  Unfortunately he discontinued those medications after a few days secondary to nausea.  We tried to change him to just a 1 month course of nitrofurantoin, but he reports even this caused severe nausea and vomiting and he discontinued that medication after just a few days.  He continues to report dysuria and urgency and urge incontinence, as well as nocturia  3-4 times at night.  Some of his frequency is explained by his high-dose diuretic, but ongoing dysuria and urge/urge incontinence consistent with infection.  I had discussed his case with Dr [name] Browning with infectious disease, and she was very hesitant to consider additional IV antibiotics,as he had no improvement previously after a week of IV.  She recommended a trial of gentamicin bladder irrigations.  He completed for gentamicin bladder irrigations with [name] Council, PA, with no improvement at all in his urinary symptoms, and urine culture remains positive for MDR E. coli.  Most recently she treated him with 2 weeks of culture appropriate Augmentin, again with no improvement in his symptoms, and urinalysis remains suspicious today for infection.  I had another long conversation with the patient and his daughter today about his challenging situation with persistently infected urine despite multiple courses of culture appropriate antibiotics, as well as challenges based on his resistance patterns, and inability to tolerate longer courses of antibiotics.  I am not sure I have an easy solution for his ongoing dysuria.We have tried oral, intravesical, and IV antibiotics at this point.  His primary complaint is ongoing dysuria.  We discussed trying any additional oral medications prior to proceeding with HOLEP, as I am not convinced that would resolve his infections and dysuria, and would be higher risk for surgery as well as incontinence postop.  They are in agreement that they would like to avoid surgery at the time being, especially with no guarantees that this would be helpful.  -Trial of Hiprex twice daily -Follow-up urine culture from today -RTC 1 month symptom check  [name] Browning, [facility] [address] [place], [place] [address] [phone]

## 2021-12-30 DIAGNOSIS — R079 Chest pain, unspecified: Secondary | ICD-10-CM | POA: Diagnosis not present

## 2021-12-30 DIAGNOSIS — I10 Essential (primary) hypertension: Secondary | ICD-10-CM | POA: Diagnosis not present

## 2021-12-30 DIAGNOSIS — I2510 Atherosclerotic heart disease of native coronary artery without angina pectoris: Secondary | ICD-10-CM | POA: Diagnosis not present

## 2021-12-30 DIAGNOSIS — I4891 Unspecified atrial fibrillation: Secondary | ICD-10-CM | POA: Diagnosis not present

## 2021-12-30 DIAGNOSIS — K219 Gastro-esophageal reflux disease without esophagitis: Secondary | ICD-10-CM | POA: Diagnosis not present

## 2021-12-30 DIAGNOSIS — R0602 Shortness of breath: Secondary | ICD-10-CM | POA: Diagnosis not present

## 2021-12-30 DIAGNOSIS — E782 Mixed hyperlipidemia: Secondary | ICD-10-CM | POA: Diagnosis not present

## 2021-12-30 DIAGNOSIS — I340 Nonrheumatic mitral (valve) insufficiency: Secondary | ICD-10-CM | POA: Diagnosis not present

## 2022-01-01 LAB — CULTURE, URINE COMPREHENSIVE

## 2022-01-02 ENCOUNTER — Other Ambulatory Visit: Payer: Self-pay | Admitting: Internal Medicine

## 2022-01-06 ENCOUNTER — Telehealth: Payer: Self-pay

## 2022-01-06 DIAGNOSIS — N390 Urinary tract infection, site not specified: Secondary | ICD-10-CM

## 2022-01-06 DIAGNOSIS — R110 Nausea: Secondary | ICD-10-CM

## 2022-01-06 MED ORDER — AMOXICILLIN-POT CLAVULANATE 875-125 MG PO TABS: 1.0000 | ORAL_TABLET | Freq: Two times a day (BID) | ORAL | 0 refills | Status: AC

## 2022-01-06 MED ORDER — ONDANSETRON HCL 4 MG PO TABS: 4.0000 mg | ORAL_TABLET | Freq: Four times a day (QID) | ORAL | 0 refills | Status: DC | PRN

## 2022-01-06 NOTE — Telephone Encounter (Signed)
-----   Message from Billey Co, MD sent at 01/06/2022  8:15 AM EST ----- Culture again shows bacteria. In addition to the [place] would like him to try and take 30 days of Augmentin 875-125 BID. He can take this with food and with a probiotic to try and prevent nausea he has had prior. Keeo follow up as scheduled  Nickolas Madrid, MD 01/06/2022

## 2022-01-06 NOTE — Telephone Encounter (Signed)
Called pt's granddaughter per DPR, informed her of the information below. Family member voiced understanding. RX sent. Granddaughter also requests RX for antiemetic medication, per Dr. Diamantina Providence verbal ok to send Zofran '4mg'$ , q6 hrs PRN, RX sent.

## 2022-01-14 DIAGNOSIS — J479 Bronchiectasis, uncomplicated: Secondary | ICD-10-CM | POA: Diagnosis not present

## 2022-01-20 ENCOUNTER — Ambulatory Visit: Payer: Self-pay | Admitting: Cardiovascular Disease

## 2022-01-20 DIAGNOSIS — R079 Chest pain, unspecified: Secondary | ICD-10-CM | POA: Insufficient documentation

## 2022-01-20 DIAGNOSIS — I340 Nonrheumatic mitral (valve) insufficiency: Secondary | ICD-10-CM | POA: Diagnosis not present

## 2022-01-20 DIAGNOSIS — I10 Essential (primary) hypertension: Secondary | ICD-10-CM | POA: Diagnosis not present

## 2022-01-20 DIAGNOSIS — R0602 Shortness of breath: Secondary | ICD-10-CM | POA: Diagnosis not present

## 2022-01-20 DIAGNOSIS — K219 Gastro-esophageal reflux disease without esophagitis: Secondary | ICD-10-CM | POA: Diagnosis not present

## 2022-01-20 DIAGNOSIS — I4891 Unspecified atrial fibrillation: Secondary | ICD-10-CM | POA: Diagnosis not present

## 2022-01-20 DIAGNOSIS — J449 Chronic obstructive pulmonary disease, unspecified: Secondary | ICD-10-CM | POA: Diagnosis not present

## 2022-01-20 DIAGNOSIS — I2510 Atherosclerotic heart disease of native coronary artery without angina pectoris: Secondary | ICD-10-CM | POA: Diagnosis not present

## 2022-01-20 DIAGNOSIS — E782 Mixed hyperlipidemia: Secondary | ICD-10-CM | POA: Diagnosis not present

## 2022-01-20 MED ORDER — SODIUM CHLORIDE 0.9% FLUSH
3.0000 mL | Freq: Two times a day (BID) | INTRAVENOUS | Status: DC
  Filled 2022-01-20: qty 3

## 2022-01-28 ENCOUNTER — Encounter
Admission: RE | Disposition: A | Discharge status: Home / Self Care | Payer: Self-pay | Source: Ambulatory Visit | Attending: Cardiovascular Disease

## 2022-01-28 ENCOUNTER — Ambulatory Visit
Admission: RE | Admit: 2022-01-28 | Discharge: 2022-01-28 | Disposition: A | Discharge status: Home / Self Care | Payer: Medicare HMO | Source: Ambulatory Visit | Attending: Cardiovascular Disease | Admitting: Cardiovascular Disease

## 2022-01-28 ENCOUNTER — Other Ambulatory Visit: Payer: Self-pay

## 2022-01-28 ENCOUNTER — Encounter: Payer: Self-pay | Admitting: Cardiovascular Disease

## 2022-01-28 DIAGNOSIS — R079 Chest pain, unspecified: Secondary | ICD-10-CM | POA: Insufficient documentation

## 2022-01-28 DIAGNOSIS — I2510 Atherosclerotic heart disease of native coronary artery without angina pectoris: Secondary | ICD-10-CM | POA: Insufficient documentation

## 2022-01-28 DIAGNOSIS — I209 Angina pectoris, unspecified: Secondary | ICD-10-CM | POA: Diagnosis not present

## 2022-01-28 HISTORY — PX: LEFT HEART CATH AND CORONARY ANGIOGRAPHY: CATH118249

## 2022-01-28 SURGERY — LEFT HEART CATH AND CORONARY ANGIOGRAPHY
Anesthesia: Moderate Sedation | Laterality: Left

## 2022-01-28 MED ORDER — SODIUM CHLORIDE 0.9 % WEIGHT BASED INFUSION
3.0000 mL/kg/h | INTRAVENOUS | Status: DC
  Administered 2022-01-28: 3 mL/kg/h via INTRAVENOUS

## 2022-01-28 MED ORDER — ASPIRIN 81 MG PO CHEW
CHEWABLE_TABLET | ORAL | Status: AC
  Filled 2022-01-28: qty 1

## 2022-01-28 MED ORDER — IOHEXOL 300 MG/ML  SOLN
INTRAMUSCULAR | Status: DC | PRN
  Administered 2022-01-28: 84 mg

## 2022-01-28 MED ORDER — FENTANYL CITRATE (PF) 100 MCG/2ML IJ SOLN
INTRAMUSCULAR | Status: AC
  Filled 2022-01-28: qty 2

## 2022-01-28 MED ORDER — SODIUM CHLORIDE 0.9 % IV SOLN: 250.0000 mL | INTRAVENOUS | Status: DC | PRN

## 2022-01-28 MED ORDER — SODIUM CHLORIDE 0.9 % WEIGHT BASED INFUSION: 1.0000 mL/kg/h | INTRAVENOUS | Status: DC

## 2022-01-28 MED ORDER — SODIUM CHLORIDE 0.9% FLUSH: 3.0000 mL | Freq: Two times a day (BID) | INTRAVENOUS | Status: DC

## 2022-01-28 MED ORDER — ONDANSETRON HCL 4 MG/2ML IJ SOLN: 4.0000 mg | Freq: Four times a day (QID) | INTRAMUSCULAR | Status: DC | PRN

## 2022-01-28 MED ORDER — FENTANYL CITRATE (PF) 100 MCG/2ML IJ SOLN
INTRAMUSCULAR | Status: DC | PRN
  Administered 2022-01-28: 50 ug via INTRAVENOUS

## 2022-01-28 MED ORDER — SODIUM CHLORIDE 0.9% FLUSH: 3.0000 mL | INTRAVENOUS | Status: DC | PRN

## 2022-01-28 MED ORDER — LABETALOL HCL 5 MG/ML IV SOLN: 10.0000 mg | INTRAVENOUS | Status: DC | PRN

## 2022-01-28 MED ORDER — MIDAZOLAM HCL 2 MG/2ML IJ SOLN
INTRAMUSCULAR | Status: AC
  Filled 2022-01-28: qty 2

## 2022-01-28 MED ORDER — ASPIRIN 81 MG PO CHEW
81.0000 mg | CHEWABLE_TABLET | ORAL | Status: AC
  Administered 2022-01-28: 81 mg via ORAL

## 2022-01-28 MED ORDER — HYDRALAZINE HCL 20 MG/ML IJ SOLN: 10.0000 mg | INTRAMUSCULAR | Status: DC | PRN

## 2022-01-28 MED ORDER — ACETAMINOPHEN 325 MG PO TABS: 650.0000 mg | ORAL_TABLET | ORAL | Status: DC | PRN

## 2022-01-28 MED ORDER — MIDAZOLAM HCL 2 MG/2ML IJ SOLN
INTRAMUSCULAR | Status: DC | PRN
  Administered 2022-01-28: 1 mg via INTRAVENOUS

## 2022-01-28 MED ORDER — HEPARIN (PORCINE) IN NACL 1000-0.9 UT/500ML-% IV SOLN
INTRAVENOUS | Status: DC | PRN
  Administered 2022-01-28 (×2): 500 mL

## 2022-01-28 SURGICAL SUPPLY — 14 items
CATH INFINITI 5 FR 3DRC (CATHETERS) IMPLANT
CATH INFINITI 5FR MULTPACK ANG (CATHETERS) IMPLANT
DEVICE CLOSURE MYNXGRIP 5F (Vascular Products) IMPLANT
DRAPE BRACHIAL (DRAPES) IMPLANT
KIT ENCORE 26 ADVANTAGE (KITS) IMPLANT
NDL PERC 18GX7CM (NEEDLE) IMPLANT
NEEDLE PERC 18GX7CM (NEEDLE) ×1 IMPLANT
PACK CARDIAC CATH (CUSTOM PROCEDURE TRAY) ×1 IMPLANT
PROTECTION STATION PRESSURIZED (MISCELLANEOUS) ×1
SET ATX SIMPLICITY (MISCELLANEOUS) IMPLANT
SHEATH AVANTI 5FR X 11CM (SHEATH) IMPLANT
STATION PROTECTION PRESSURIZED (MISCELLANEOUS) IMPLANT
TUBING CIL FLEX 10 FLL-RA (TUBING) IMPLANT
WIRE GUIDERIGHT .035X150 (WIRE) IMPLANT

## 2022-01-28 NOTE — Progress Notes (Signed)
Pt. HR down to low 30's multiple times. Dr. Humphrey Rolls phoned & over to see pt. Now. MD looked at pt. Med. List. MD told granddaughter to hold Metoprolol today & start 1/2 doses tomorrow. Pt. Asymptomatic with HR. No groin complications at present.

## 2022-02-01 DIAGNOSIS — I340 Nonrheumatic mitral (valve) insufficiency: Secondary | ICD-10-CM | POA: Diagnosis not present

## 2022-02-01 DIAGNOSIS — R0602 Shortness of breath: Secondary | ICD-10-CM | POA: Diagnosis not present

## 2022-02-01 DIAGNOSIS — I10 Essential (primary) hypertension: Secondary | ICD-10-CM | POA: Diagnosis not present

## 2022-02-01 DIAGNOSIS — K219 Gastro-esophageal reflux disease without esophagitis: Secondary | ICD-10-CM | POA: Diagnosis not present

## 2022-02-01 DIAGNOSIS — E782 Mixed hyperlipidemia: Secondary | ICD-10-CM | POA: Diagnosis not present

## 2022-02-01 DIAGNOSIS — I2510 Atherosclerotic heart disease of native coronary artery without angina pectoris: Secondary | ICD-10-CM | POA: Diagnosis not present

## 2022-02-01 DIAGNOSIS — I4891 Unspecified atrial fibrillation: Secondary | ICD-10-CM | POA: Diagnosis not present

## 2022-02-01 DIAGNOSIS — R079 Chest pain, unspecified: Secondary | ICD-10-CM | POA: Diagnosis not present

## 2022-02-02 ENCOUNTER — Ambulatory Visit (INDEPENDENT_AMBULATORY_CARE_PROVIDER_SITE_OTHER): Payer: Medicare HMO | Admitting: Urology

## 2022-02-02 ENCOUNTER — Encounter: Payer: Self-pay | Admitting: Urology

## 2022-02-02 VITALS — BP 118/49 | HR 63 | Ht 74.0 in | Wt 169.0 lb

## 2022-02-02 DIAGNOSIS — N3941 Urge incontinence: Secondary | ICD-10-CM

## 2022-02-02 DIAGNOSIS — R300 Dysuria: Secondary | ICD-10-CM | POA: Diagnosis not present

## 2022-02-02 DIAGNOSIS — R399 Unspecified symptoms and signs involving the genitourinary system: Secondary | ICD-10-CM

## 2022-02-02 MED ORDER — TAMSULOSIN HCL 0.4 MG PO CAPS: 0.4000 mg | ORAL_CAPSULE | Freq: Every day | ORAL | 11 refills | Status: DC

## 2022-02-02 MED ORDER — METHENAMINE HIPPURATE 1 G PO TABS: 1.0000 g | ORAL_TABLET | Freq: Two times a day (BID) | ORAL | 3 refills | Status: DC

## 2022-02-02 NOTE — Progress Notes (Signed)
02/02/2022 2:35 PM   [name] Browning 05-20-1940 [id number]  Reason for visit: Follow up recurrent UTIs, LUTS, dysuria/nausea  HPI: Very comorbid 82 year old male with A-fib on apixaban, CHF, history of DVT, hypertension, COPD on chronic oxygen who has had a complex urologic history over the last 1-2 years.  Originally presented 09/18/2020 with a 8 mm left proximal ureteral stone and hydronephrosis, and ultimately underwent left ureteral stent placement with Dr. Abner Greenspan for refractory left renal colic.  He had not had any UTIs or significant urologic problems prior to that hospitalization.  He then underwent left ureteroscopy, laser lithotripsy, and stent placement with me on 10/16/2020 with dusting of all left-sided stones.  These were very hard and felt to be consistent with calcium oxalate monohydrate stones.   He has had numerous MDR UTIs and positive culture since that time and treated with a number of antibiotics both with our clinic, [facility], and with infectious disease.  He is a very difficult historian and is difficult to tell if he has had improvement on antibiotics, or if this represents chronic colonization.  From reviewing my prior notes, it sounds like originally he had some improvement after 2 weeks of nitrofurantoin, but symptoms recurred a few days later.  He underwent cystoscopy on 08/19/2021 that showed an enlarged prostate(62 g on prior CT) but no other abnormalities.  PVRs have always been normal.  We opted for a 1 month course of nitrofurantoin and Augmentin for possible prostatitis at that visit, in the setting of his high risk for any surgery with his comorbidities.  Unfortunately he discontinued those medications after a few days secondary to nausea.  We tried to change him to just a 1 month course of nitrofurantoin, but he reports even this caused severe nausea and vomiting and he discontinued that medication after just a few days.  He also has failed a course of fosfomycin.  He  continued to report dysuria and urgency and urge incontinence, as well as nocturia 3-4 times at night.  Some of his frequency is explained by his high-dose diuretic, but ongoing dysuria and urge/urge incontinence consistent with infection.  I had discussed his case with Dr Steva Ready with infectious disease, and she was very hesitant to consider additional IV antibiotics,as he had no improvement previously after a week of IV ertapenum.  She recommended a trial of gentamicin bladder irrigations.  He completed 4 gentamicin bladder irrigations with [name], PA, with no improvement at all in his urinary symptoms, and urine culture remains positive for MDR E. coli.  Most recently she treated him with 2 weeks of culture appropriate Augmentin, again with no improvement in his symptoms, and urinalysis remains suspicious today for infection.  I had another long conversation with the patient and his daughter today about his challenging situation with persistently infected urine despite multiple courses of culture appropriate antibiotics, as well as challenges based on his resistance patterns, and inability to tolerate longer courses of antibiotics.  I am not sure I have an easy solution for his ongoing dysuria.We have tried oral, intravesical, and IV antibiotics at this point.  His primary complaint is ongoing dysuria.  We discussed trying any additional oral medications prior to proceeding with HOLEP, as I am not convinced that would resolve his infections and dysuria, and would be higher risk for surgery as well as incontinence postop.  They are in agreement that they would like to avoid surgery at the time being, especially with no guarantees that this would be  helpful.  At our last visit on 12/29/2021, he opted for 1 final trial of Augmentin x 4 weeks as well as addition of HIPREX.  Since that time, it sounds like he is actually done a little bit better.  He is not having any burning with urination or  dysuria at this time, and urgency/urge incontinence has improved.  He is only having nocturia twice per night.  He does report some weak stream and some sensation of not completely emptying.  I think it is worth a trial of adding Flomax, risks and benefits were discussed.  -Trial of Flomax -Continue HIPREX -RTC 9 months PVR    Billey Co, MD  [facility] [address] [place], [place] [address] [phone]

## 2022-02-14 DIAGNOSIS — J479 Bronchiectasis, uncomplicated: Secondary | ICD-10-CM | POA: Diagnosis not present

## 2022-02-17 ENCOUNTER — Other Ambulatory Visit: Payer: Self-pay | Admitting: Internal Medicine

## 2022-03-04 ENCOUNTER — Inpatient Hospital Stay
Admission: EM | Admit: 2022-03-04 | Discharge: 2022-03-07 | DRG: 571 | Disposition: A | Discharge status: Home / Self Care | Payer: Medicare HMO | Attending: Internal Medicine | Admitting: Internal Medicine

## 2022-03-04 ENCOUNTER — Emergency Department: Payer: Medicare HMO

## 2022-03-04 ENCOUNTER — Ambulatory Visit: Payer: Medicare HMO | Admitting: Cardiovascular Disease

## 2022-03-04 DIAGNOSIS — Z8619 Personal history of other infectious and parasitic diseases: Secondary | ICD-10-CM

## 2022-03-04 DIAGNOSIS — E890 Postprocedural hypothyroidism: Secondary | ICD-10-CM | POA: Diagnosis present

## 2022-03-04 DIAGNOSIS — I4891 Unspecified atrial fibrillation: Secondary | ICD-10-CM | POA: Diagnosis present

## 2022-03-04 DIAGNOSIS — N179 Acute kidney failure, unspecified: Secondary | ICD-10-CM | POA: Diagnosis present

## 2022-03-04 DIAGNOSIS — Z8673 Personal history of transient ischemic attack (TIA), and cerebral infarction without residual deficits: Secondary | ICD-10-CM

## 2022-03-04 DIAGNOSIS — G5603 Carpal tunnel syndrome, bilateral upper limbs: Secondary | ICD-10-CM | POA: Diagnosis present

## 2022-03-04 DIAGNOSIS — E785 Hyperlipidemia, unspecified: Secondary | ICD-10-CM | POA: Diagnosis present

## 2022-03-04 DIAGNOSIS — Z85828 Personal history of other malignant neoplasm of skin: Secondary | ICD-10-CM | POA: Diagnosis not present

## 2022-03-04 DIAGNOSIS — K219 Gastro-esophageal reflux disease without esophagitis: Secondary | ICD-10-CM | POA: Diagnosis present

## 2022-03-04 DIAGNOSIS — L03311 Cellulitis of abdominal wall: Secondary | ICD-10-CM | POA: Diagnosis not present

## 2022-03-04 DIAGNOSIS — I2510 Atherosclerotic heart disease of native coronary artery without angina pectoris: Secondary | ICD-10-CM | POA: Diagnosis present

## 2022-03-04 DIAGNOSIS — Z7901 Long term (current) use of anticoagulants: Secondary | ICD-10-CM

## 2022-03-04 DIAGNOSIS — Z8249 Family history of ischemic heart disease and other diseases of the circulatory system: Secondary | ICD-10-CM

## 2022-03-04 DIAGNOSIS — M19011 Primary osteoarthritis, right shoulder: Secondary | ICD-10-CM | POA: Diagnosis present

## 2022-03-04 DIAGNOSIS — Z885 Allergy status to narcotic agent status: Secondary | ICD-10-CM

## 2022-03-04 DIAGNOSIS — D649 Anemia, unspecified: Secondary | ICD-10-CM | POA: Diagnosis present

## 2022-03-04 DIAGNOSIS — Z7902 Long term (current) use of antithrombotics/antiplatelets: Secondary | ICD-10-CM

## 2022-03-04 DIAGNOSIS — E878 Other disorders of electrolyte and fluid balance, not elsewhere classified: Secondary | ICD-10-CM | POA: Diagnosis present

## 2022-03-04 DIAGNOSIS — I5032 Chronic diastolic (congestive) heart failure: Secondary | ICD-10-CM | POA: Diagnosis present

## 2022-03-04 DIAGNOSIS — Z87891 Personal history of nicotine dependence: Secondary | ICD-10-CM

## 2022-03-04 DIAGNOSIS — Z87440 Personal history of urinary (tract) infections: Secondary | ICD-10-CM

## 2022-03-04 DIAGNOSIS — Z7951 Long term (current) use of inhaled steroids: Secondary | ICD-10-CM

## 2022-03-04 DIAGNOSIS — Z7982 Long term (current) use of aspirin: Secondary | ICD-10-CM

## 2022-03-04 DIAGNOSIS — Z9981 Dependence on supplemental oxygen: Secondary | ICD-10-CM | POA: Diagnosis not present

## 2022-03-04 DIAGNOSIS — R229 Localized swelling, mass and lump, unspecified: Secondary | ICD-10-CM | POA: Diagnosis present

## 2022-03-04 DIAGNOSIS — I110 Hypertensive heart disease with heart failure: Secondary | ICD-10-CM | POA: Diagnosis present

## 2022-03-04 DIAGNOSIS — L02214 Cutaneous abscess of groin: Secondary | ICD-10-CM | POA: Diagnosis present

## 2022-03-04 DIAGNOSIS — L03314 Cellulitis of groin: Principal | ICD-10-CM | POA: Diagnosis present

## 2022-03-04 DIAGNOSIS — Z87442 Personal history of urinary calculi: Secondary | ICD-10-CM

## 2022-03-04 DIAGNOSIS — E876 Hypokalemia: Secondary | ICD-10-CM | POA: Diagnosis present

## 2022-03-04 DIAGNOSIS — L299 Pruritus, unspecified: Secondary | ICD-10-CM | POA: Diagnosis present

## 2022-03-04 DIAGNOSIS — J449 Chronic obstructive pulmonary disease, unspecified: Secondary | ICD-10-CM | POA: Diagnosis present

## 2022-03-04 DIAGNOSIS — Z8616 Personal history of COVID-19: Secondary | ICD-10-CM

## 2022-03-04 DIAGNOSIS — F419 Anxiety disorder, unspecified: Secondary | ICD-10-CM | POA: Diagnosis present

## 2022-03-04 DIAGNOSIS — Z8701 Personal history of pneumonia (recurrent): Secondary | ICD-10-CM

## 2022-03-04 DIAGNOSIS — Z79899 Other long term (current) drug therapy: Secondary | ICD-10-CM

## 2022-03-04 LAB — COMPREHENSIVE METABOLIC PANEL
ALT: 14 U/L (ref 0–44)
AST: 25 U/L (ref 15–41)
Albumin: 3.9 g/dL (ref 3.5–5.0)
Alkaline Phosphatase: 149 U/L — ABNORMAL HIGH (ref 38–126)
Anion gap: 16 — ABNORMAL HIGH (ref 5–15)
BUN: 26 mg/dL — ABNORMAL HIGH (ref 8–23)
CO2: 27 mmol/L (ref 22–32)
Calcium: 9.2 mg/dL (ref 8.9–10.3)
Chloride: 94 mmol/L — ABNORMAL LOW (ref 98–111)
Creatinine, Ser: 1.24 mg/dL (ref 0.61–1.24)
GFR, Estimated: 59 mL/min — ABNORMAL LOW (ref >60)
Glucose, Bld: 137 mg/dL — ABNORMAL HIGH (ref 70–99)
Potassium: 3.4 mmol/L — ABNORMAL LOW (ref 3.5–5.1)
Sodium: 137 mmol/L (ref 135–145)
Total Bilirubin: 1.2 mg/dL (ref 0.3–1.2)
Total Protein: 7.6 g/dL (ref 6.5–8.1)

## 2022-03-04 LAB — CBC
HCT: 37.8 % — ABNORMAL LOW (ref 39.0–52.0)
Hemoglobin: 12 g/dL — ABNORMAL LOW (ref 13.0–17.0)
MCH: 28.5 pg (ref 26.0–34.0)
MCHC: 31.7 g/dL (ref 30.0–36.0)
MCV: 89.8 fL (ref 80.0–100.0)
Platelets: 243 10*3/uL (ref 150–400)
RBC: 4.21 MIL/uL — ABNORMAL LOW (ref 4.22–5.81)
RDW: 13.2 % (ref 11.5–15.5)
WBC: 11.6 10*3/uL — ABNORMAL HIGH (ref 4.0–10.5)
nRBC: 0 % (ref 0.0–0.2)

## 2022-03-04 MED ORDER — IOHEXOL 300 MG/ML  SOLN
100.0000 mL | Freq: Once | INTRAMUSCULAR | Status: AC | PRN
  Administered 2022-03-04: 100 mL via INTRAVENOUS

## 2022-03-04 MED ORDER — VANCOMYCIN HCL 1750 MG/350ML IV SOLN
1750.0000 mg | Freq: Once | INTRAVENOUS | Status: AC
  Administered 2022-03-05: 1750 mg via INTRAVENOUS
  Filled 2022-03-04: qty 350

## 2022-03-04 MED ORDER — SODIUM CHLORIDE 0.9 % IV SOLN
1.0000 g | Freq: Once | INTRAVENOUS | Status: AC
  Administered 2022-03-04: 1 g via INTRAVENOUS
  Filled 2022-03-04: qty 10

## 2022-03-04 NOTE — ED Provider Notes (Signed)
[facility] Provider Note    Event Date/Time   First MD Initiated Contact with Patient 03/04/22 2155     (approximate)   History   Abscess   HPI  [name] Browning is a 82 y.o. male status post hernia repair presents to the ER for evaluation of left groin pain.  States he fell because of bite a few days ago progressively becoming worse severely painful tender with streaking erythema.  No pain or swelling in his scrotum or along his genitals.  No nausea or vomiting.  Has had some chills but no measured fever.  On antibiotics for history of ESBL E. coli recurrent UTIs.     Physical Exam   Triage Vital Signs: ED Triage Vitals  Enc Vitals Group     BP 03/04/22 1835 139/62     Pulse Rate 03/04/22 1835 74     Resp 03/04/22 1835 18     Temp 03/04/22 1835 (!) 97.5 F (36.4 C)     Temp Source 03/04/22 1835 Oral     SpO2 03/04/22 1835 100 %     Weight 03/04/22 1836 165 lb (74.8 kg)     Height 03/04/22 1836 '6\' 2"'$  (1.88 m)     Head Circumference --      Peak Flow --      Pain Score 03/04/22 1842 4     Pain Loc --      Pain Edu? --      Excl. in [place]? --     Most recent vital signs: Vitals:   03/04/22 2200 03/04/22 2236  BP: (!) 141/61   Pulse: (!) 56   Resp: 18   Temp:  97.6 F (36.4 C)  SpO2: 100%      Constitutional: Alert  Eyes: Conjunctivae are normal.  Head: Atraumatic. Nose: No congestion/rhinnorhea. Mouth/Throat: Mucous membranes are moist.   Neck: Painless ROM.  Cardiovascular:   Good peripheral circulation. Respiratory: Normal respiratory effort.  No retractions.  Gastrointestinal: Soft and nontender.  Sizable area of cellulitis and left groin along area overlying the inguinal canal.  Cellulitis abuts the scrotum but does not extend into the scrotum or the genitalia.  Has some lymphadenopathy no fluctuance.  It is fairly swollen.  No blistering or crepitus. Musculoskeletal:  no deformity Neurologic:  MAE spontaneously. No gross focal  neurologic deficits are appreciated.  Skin:  Skin is warm, dry and intact. No rash noted. Psychiatric: Mood and affect are normal. Speech and behavior are normal.    ED Results / Procedures / Treatments   Labs (all labs ordered are listed, but only abnormal results are displayed) Labs Reviewed  COMPREHENSIVE METABOLIC PANEL - Abnormal; Notable for the following components:      Result Value   Potassium 3.4 (*)    Chloride 94 (*)    Glucose, Bld 137 (*)    BUN 26 (*)    Alkaline Phosphatase 149 (*)    GFR, Estimated 59 (*)    Anion gap 16 (*)    All other components within normal limits  CBC - Abnormal; Notable for the following components:   WBC 11.6 (*)    RBC 4.21 (*)    Hemoglobin 12.0 (*)    HCT 37.8 (*)    All other components within normal limits  LACTIC ACID, PLASMA  LACTIC ACID, PLASMA     EKG     RADIOLOGY Please see ED Course for my review and interpretation.  I personally reviewed all  radiographic images ordered to evaluate for the above acute complaints and reviewed radiology reports and findings.  These findings were personally discussed with the patient.  Please see medical record for radiology report.    PROCEDURES:  Critical Care performed: No  Procedures   MEDICATIONS ORDERED IN ED: Medications  cefTRIAXone (ROCEPHIN) 1 g in sodium chloride 0.9 % 100 mL IVPB (has no administration in time range)  vancomycin (VANCOREADY) IVPB 1750 mg/350 mL (has no administration in time range)  iohexol (OMNIPAQUE) 300 MG/ML solution 100 mL (100 mLs Intravenous Contrast Given 03/04/22 2231)     IMPRESSION / MDM / ASSESSMENT AND PLAN / ED COURSE  I reviewed the triage vital signs and the nursing notes.                              Differential diagnosis includes, but is not limited to, abscess, cellulitis, hernia, NSTI, Fournier's  Patient presenting to the ER for evaluation of symptoms as described above.  Based on symptoms, risk factors and considered  above differential, this presenting complaint could reflect a potentially life-threatening illness therefore the patient will be placed on continuous pulse oximetry and telemetry for monitoring.  Laboratory evaluation will be sent to evaluate for the above complaints.  Patient with exam findings concerning for cellulitis possible abscess but no clear area of fluctuance in the muscle concern for underlying hernia given the location.  Does not have any scrotal involvement but has fairly extensive very tender area of cellulitis.  Will order CT   Clinical Course as of 03/04/22 2330  Fri Mar 04, 2022  2243 CT imaging my review and interpretation does not show any evidence of hernia location suspect cellulitis possible abscess will await formal radiology report. [PR]  2319 Given the patient's comorbidities and extent of cellulitis discussed option for antibiotics as an outpatient versus admission to the hospital for IV antibiotics.  Given his risk factors and the extent of cellulitis and location I have recommended admission and have ordered IV Rocephin as well as Vanco.  Patient is agreeable plan.  Will consult hospitalist for admission. [PR]    Clinical Course User Index [PR] Merlyn Lot, MD      FINAL CLINICAL IMPRESSION(S) / ED DIAGNOSES   Final diagnoses:  Cellulitis of groin     Rx / DC Orders   ED Discharge Orders     None        Note:  This document was prepared using Dragon voice recognition software and may include unintentional dictation errors.    Merlyn Lot, MD 03/04/22 [phone]

## 2022-03-04 NOTE — ED Triage Notes (Signed)
Pt presents tot eh ED via POV due to groin abscess. Pt states its been there for a couple of days. Pt states it started off as a bite and got worse overitme. Pt denies NVD. Pt wear O2 at baseline.

## 2022-03-04 NOTE — Progress Notes (Signed)
PHARMACY -  BRIEF ANTIBIOTIC NOTE   Pharmacy has received consult(s) for Vancomycin from an ED provider.  The patient's profile has been reviewed for ht/wt/allergies/indication/available labs.    One time order(s) placed for Vancomycin 1750 mg per pt wt: 74.8 kg.  Further antibiotics/pharmacy consults should be ordered by admitting physician if indicated.                       Thank you, Renda Rolls, PharmD, [facility] 03/04/2022 11:20 PM

## 2022-03-05 ENCOUNTER — Other Ambulatory Visit: Payer: Self-pay

## 2022-03-05 ENCOUNTER — Encounter: Payer: Self-pay | Admitting: Family Medicine

## 2022-03-05 DIAGNOSIS — Z8249 Family history of ischemic heart disease and other diseases of the circulatory system: Secondary | ICD-10-CM | POA: Diagnosis not present

## 2022-03-05 DIAGNOSIS — D649 Anemia, unspecified: Secondary | ICD-10-CM | POA: Diagnosis present

## 2022-03-05 DIAGNOSIS — E878 Other disorders of electrolyte and fluid balance, not elsewhere classified: Secondary | ICD-10-CM | POA: Diagnosis present

## 2022-03-05 DIAGNOSIS — J449 Chronic obstructive pulmonary disease, unspecified: Secondary | ICD-10-CM | POA: Insufficient documentation

## 2022-03-05 DIAGNOSIS — L02214 Cutaneous abscess of groin: Secondary | ICD-10-CM | POA: Diagnosis not present

## 2022-03-05 DIAGNOSIS — L03314 Cellulitis of groin: Secondary | ICD-10-CM | POA: Diagnosis present

## 2022-03-05 DIAGNOSIS — I5032 Chronic diastolic (congestive) heart failure: Secondary | ICD-10-CM | POA: Diagnosis present

## 2022-03-05 DIAGNOSIS — I2510 Atherosclerotic heart disease of native coronary artery without angina pectoris: Secondary | ICD-10-CM | POA: Diagnosis not present

## 2022-03-05 DIAGNOSIS — Z7901 Long term (current) use of anticoagulants: Secondary | ICD-10-CM | POA: Diagnosis not present

## 2022-03-05 DIAGNOSIS — Z87891 Personal history of nicotine dependence: Secondary | ICD-10-CM | POA: Diagnosis not present

## 2022-03-05 DIAGNOSIS — I4891 Unspecified atrial fibrillation: Secondary | ICD-10-CM | POA: Diagnosis present

## 2022-03-05 DIAGNOSIS — Z8616 Personal history of COVID-19: Secondary | ICD-10-CM | POA: Diagnosis not present

## 2022-03-05 DIAGNOSIS — Z85828 Personal history of other malignant neoplasm of skin: Secondary | ICD-10-CM | POA: Diagnosis not present

## 2022-03-05 DIAGNOSIS — G5603 Carpal tunnel syndrome, bilateral upper limbs: Secondary | ICD-10-CM | POA: Diagnosis present

## 2022-03-05 DIAGNOSIS — R229 Localized swelling, mass and lump, unspecified: Secondary | ICD-10-CM | POA: Diagnosis present

## 2022-03-05 DIAGNOSIS — E785 Hyperlipidemia, unspecified: Secondary | ICD-10-CM

## 2022-03-05 DIAGNOSIS — E876 Hypokalemia: Secondary | ICD-10-CM | POA: Diagnosis present

## 2022-03-05 DIAGNOSIS — Z8673 Personal history of transient ischemic attack (TIA), and cerebral infarction without residual deficits: Secondary | ICD-10-CM | POA: Diagnosis not present

## 2022-03-05 DIAGNOSIS — L299 Pruritus, unspecified: Secondary | ICD-10-CM | POA: Diagnosis present

## 2022-03-05 DIAGNOSIS — I110 Hypertensive heart disease with heart failure: Secondary | ICD-10-CM | POA: Diagnosis present

## 2022-03-05 DIAGNOSIS — Z9981 Dependence on supplemental oxygen: Secondary | ICD-10-CM | POA: Diagnosis not present

## 2022-03-05 DIAGNOSIS — E890 Postprocedural hypothyroidism: Secondary | ICD-10-CM | POA: Diagnosis present

## 2022-03-05 DIAGNOSIS — N179 Acute kidney failure, unspecified: Secondary | ICD-10-CM | POA: Diagnosis present

## 2022-03-05 DIAGNOSIS — Z8619 Personal history of other infectious and parasitic diseases: Secondary | ICD-10-CM | POA: Diagnosis not present

## 2022-03-05 DIAGNOSIS — M19011 Primary osteoarthritis, right shoulder: Secondary | ICD-10-CM | POA: Diagnosis present

## 2022-03-05 LAB — LACTIC ACID, PLASMA
Lactic Acid, Venous: 0.7 mmol/L (ref 0.5–1.9)
Lactic Acid, Venous: 0.9 mmol/L (ref 0.5–1.9)

## 2022-03-05 LAB — BASIC METABOLIC PANEL
Anion gap: 11 (ref 5–15)
BUN: 30 mg/dL — ABNORMAL HIGH (ref 8–23)
CO2: 28 mmol/L (ref 22–32)
Calcium: 8.5 mg/dL — ABNORMAL LOW (ref 8.9–10.3)
Chloride: 99 mmol/L (ref 98–111)
Creatinine, Ser: 1.21 mg/dL (ref 0.61–1.24)
GFR, Estimated: >60 mL/min (ref >60)
Glucose, Bld: 107 mg/dL — ABNORMAL HIGH (ref 70–99)
Potassium: 3.4 mmol/L — ABNORMAL LOW (ref 3.5–5.1)
Sodium: 138 mmol/L (ref 135–145)

## 2022-03-05 LAB — CBC
HCT: 33.4 % — ABNORMAL LOW (ref 39.0–52.0)
Hemoglobin: 10.7 g/dL — ABNORMAL LOW (ref 13.0–17.0)
MCH: 28.8 pg (ref 26.0–34.0)
MCHC: 32 g/dL (ref 30.0–36.0)
MCV: 90 fL (ref 80.0–100.0)
Platelets: 200 10*3/uL (ref 150–400)
RBC: 3.71 MIL/uL — ABNORMAL LOW (ref 4.22–5.81)
RDW: 13.3 % (ref 11.5–15.5)
WBC: 9.3 10*3/uL (ref 4.0–10.5)
nRBC: 0 % (ref 0.0–0.2)

## 2022-03-05 MED ORDER — POTASSIUM CHLORIDE CRYS ER 20 MEQ PO TBCR: 20.0000 meq | EXTENDED_RELEASE_TABLET | Freq: Every day | ORAL | Status: DC

## 2022-03-05 MED ORDER — METHENAMINE MANDELATE 0.5 G PO TABS
1.0000 g | ORAL_TABLET | Freq: Two times a day (BID) | ORAL | Status: DC
  Administered 2022-03-05 – 2022-03-07 (×5): 1 g via ORAL
  Filled 2022-03-05 (×4): qty 2
  Filled 2022-03-05: qty 1
  Filled 2022-03-05 (×2): qty 2

## 2022-03-05 MED ORDER — ACETAMINOPHEN 650 MG RE SUPP: 650.0000 mg | Freq: Four times a day (QID) | RECTAL | Status: DC | PRN

## 2022-03-05 MED ORDER — VANCOMYCIN HCL 1250 MG/250ML IV SOLN: 1250.0000 mg | INTRAVENOUS | Status: DC

## 2022-03-05 MED ORDER — PANTOPRAZOLE SODIUM 40 MG PO TBEC
40.0000 mg | DELAYED_RELEASE_TABLET | Freq: Two times a day (BID) | ORAL | Status: DC
  Administered 2022-03-05 – 2022-03-07 (×5): 40 mg via ORAL
  Filled 2022-03-05 (×6): qty 1

## 2022-03-05 MED ORDER — TAMSULOSIN HCL 0.4 MG PO CAPS
0.4000 mg | ORAL_CAPSULE | Freq: Every day | ORAL | Status: DC
  Administered 2022-03-05 – 2022-03-07 (×3): 0.4 mg via ORAL
  Filled 2022-03-05 (×3): qty 1

## 2022-03-05 MED ORDER — METOPROLOL SUCCINATE ER 50 MG PO TB24
50.0000 mg | ORAL_TABLET | Freq: Every day | ORAL | Status: DC
  Administered 2022-03-05: 50 mg via ORAL
  Filled 2022-03-05 (×2): qty 1

## 2022-03-05 MED ORDER — VANCOMYCIN HCL IN DEXTROSE 1-5 GM/200ML-% IV SOLN
1000.0000 mg | INTRAVENOUS | Status: DC
  Administered 2022-03-05: 1000 mg via INTRAVENOUS
  Filled 2022-03-05: qty 200

## 2022-03-05 MED ORDER — ISOSORBIDE MONONITRATE ER 60 MG PO TB24
30.0000 mg | ORAL_TABLET | Freq: Every day | ORAL | Status: DC
  Administered 2022-03-05: 30 mg via ORAL
  Filled 2022-03-05: qty 1

## 2022-03-05 MED ORDER — MAGNESIUM HYDROXIDE 400 MG/5ML PO SUSP: 30.0000 mL | Freq: Every day | ORAL | Status: DC | PRN

## 2022-03-05 MED ORDER — SODIUM CHLORIDE 0.9 % IV SOLN: INTRAVENOUS | Status: DC | PRN

## 2022-03-05 MED ORDER — DIPHENHYDRAMINE HCL 25 MG PO CAPS
25.0000 mg | ORAL_CAPSULE | Freq: Four times a day (QID) | ORAL | Status: DC | PRN
  Administered 2022-03-06: 25 mg via ORAL
  Filled 2022-03-05: qty 1

## 2022-03-05 MED ORDER — CHLORHEXIDINE GLUCONATE CLOTH 2 % EX PADS
6.0000 | MEDICATED_PAD | Freq: Every day | CUTANEOUS | Status: DC
  Administered 2022-03-05: 6 via TOPICAL

## 2022-03-05 MED ORDER — TRAZODONE HCL 50 MG PO TABS: 25.0000 mg | ORAL_TABLET | Freq: Every evening | ORAL | Status: DC | PRN

## 2022-03-05 MED ORDER — SODIUM CHLORIDE 0.9 % IV SOLN: 2.0000 g | Freq: Once | INTRAVENOUS | Status: DC

## 2022-03-05 MED ORDER — ONDANSETRON HCL 4 MG/2ML IJ SOLN
4.0000 mg | Freq: Four times a day (QID) | INTRAMUSCULAR | Status: DC | PRN
  Filled 2022-03-05: qty 2

## 2022-03-05 MED ORDER — ASPIRIN 81 MG PO TBEC
81.0000 mg | DELAYED_RELEASE_TABLET | Freq: Every day | ORAL | Status: DC
  Administered 2022-03-05 – 2022-03-06 (×2): 81 mg via ORAL
  Filled 2022-03-05 (×2): qty 1

## 2022-03-05 MED ORDER — ACETAMINOPHEN 325 MG PO TABS: 650.0000 mg | ORAL_TABLET | Freq: Four times a day (QID) | ORAL | Status: DC | PRN

## 2022-03-05 MED ORDER — ORAL CARE MOUTH RINSE: 15.0000 mL | OROMUCOSAL | Status: DC | PRN

## 2022-03-05 MED ORDER — LOSARTAN POTASSIUM 50 MG PO TABS
50.0000 mg | ORAL_TABLET | Freq: Every day | ORAL | Status: DC
  Administered 2022-03-05: 50 mg via ORAL
  Filled 2022-03-05: qty 1

## 2022-03-05 MED ORDER — FERROUS GLUCONATE 324 (38 FE) MG PO TABS
324.0000 mg | ORAL_TABLET | Freq: Three times a day (TID) | ORAL | Status: DC
  Administered 2022-03-05 – 2022-03-07 (×7): 324 mg via ORAL
  Filled 2022-03-05 (×8): qty 1

## 2022-03-05 MED ORDER — ONDANSETRON HCL 4 MG PO TABS: 4.0000 mg | ORAL_TABLET | Freq: Four times a day (QID) | ORAL | Status: DC | PRN

## 2022-03-05 MED ORDER — ALBUTEROL SULFATE (2.5 MG/3ML) 0.083% IN NEBU: 2.5000 mg | INHALATION_SOLUTION | Freq: Four times a day (QID) | RESPIRATORY_TRACT | Status: DC | PRN

## 2022-03-05 MED ORDER — SODIUM CHLORIDE 0.9 % IV SOLN: INTRAVENOUS | Status: DC

## 2022-03-05 MED ORDER — POTASSIUM CHLORIDE CRYS ER 20 MEQ PO TBCR
40.0000 meq | EXTENDED_RELEASE_TABLET | Freq: Once | ORAL | Status: AC
  Administered 2022-03-05: 40 meq via ORAL
  Filled 2022-03-05: qty 2

## 2022-03-05 MED ORDER — LORATADINE 10 MG PO TABS
10.0000 mg | ORAL_TABLET | Freq: Every day | ORAL | Status: DC
  Administered 2022-03-05 – 2022-03-07 (×3): 10 mg via ORAL
  Filled 2022-03-05 (×3): qty 1

## 2022-03-05 MED ORDER — FAMOTIDINE 20 MG PO TABS
20.0000 mg | ORAL_TABLET | Freq: Every day | ORAL | Status: DC
  Administered 2022-03-05 – 2022-03-06 (×2): 20 mg via ORAL
  Filled 2022-03-05 (×2): qty 1

## 2022-03-05 MED ORDER — PHENAZOPYRIDINE HCL 200 MG PO TABS: 200.0000 mg | ORAL_TABLET | Freq: Three times a day (TID) | ORAL | Status: DC | PRN

## 2022-03-05 MED ORDER — SODIUM CHLORIDE 0.9 % IV BOLUS
500.0000 mL | Freq: Once | INTRAVENOUS | Status: AC
  Administered 2022-03-05: 500 mL via INTRAVENOUS

## 2022-03-05 MED ORDER — ATORVASTATIN CALCIUM 20 MG PO TABS
40.0000 mg | ORAL_TABLET | Freq: Every day | ORAL | Status: DC
  Administered 2022-03-05 – 2022-03-06 (×2): 40 mg via ORAL
  Filled 2022-03-05 (×2): qty 2

## 2022-03-05 MED ORDER — DIGOXIN 125 MCG PO TABS: 125.0000 ug | ORAL_TABLET | Freq: Every day | ORAL | Status: DC

## 2022-03-05 MED ORDER — CLOPIDOGREL BISULFATE 75 MG PO TABS: 75.0000 mg | ORAL_TABLET | Freq: Every day | ORAL | Status: DC

## 2022-03-05 MED ORDER — MORPHINE SULFATE (PF) 2 MG/ML IV SOLN: 2.0000 mg | INTRAVENOUS | Status: DC | PRN

## 2022-03-05 MED ORDER — SODIUM CHLORIDE 0.9 % IV SOLN
2.0000 g | Freq: Two times a day (BID) | INTRAVENOUS | Status: DC
  Administered 2022-03-05 (×2): 2 g via INTRAVENOUS
  Filled 2022-03-05: qty 12.5
  Filled 2022-03-05: qty 2
  Filled 2022-03-05: qty 12.5

## 2022-03-05 MED ORDER — OXYCODONE HCL 5 MG PO TABS
5.0000 mg | ORAL_TABLET | ORAL | Status: DC | PRN
  Administered 2022-03-05 – 2022-03-06 (×2): 5 mg via ORAL
  Filled 2022-03-05 (×2): qty 1

## 2022-03-05 MED ORDER — MAGNESIUM OXIDE 400 MG PO TABS
400.0000 mg | ORAL_TABLET | Freq: Every day | ORAL | Status: DC
  Administered 2022-03-05 – 2022-03-07 (×3): 400 mg via ORAL
  Filled 2022-03-05 (×6): qty 1

## 2022-03-05 MED ORDER — BUDESON-GLYCOPYRROL-FORMOTEROL 160-9-4.8 MCG/ACT IN AERO: 2.0000 | INHALATION_SPRAY | Freq: Two times a day (BID) | RESPIRATORY_TRACT | Status: DC

## 2022-03-05 MED ORDER — ENOXAPARIN SODIUM 40 MG/0.4ML IJ SOSY
40.0000 mg | PREFILLED_SYRINGE | INTRAMUSCULAR | Status: DC
  Administered 2022-03-05 – 2022-03-07 (×3): 40 mg via SUBCUTANEOUS
  Filled 2022-03-05 (×3): qty 0.4

## 2022-03-05 MED ORDER — NITROGLYCERIN 0.4 MG SL SUBL: 0.4000 mg | SUBLINGUAL_TABLET | SUBLINGUAL | Status: DC | PRN

## 2022-03-05 MED ORDER — ACETAMINOPHEN 500 MG PO TABS
1000.0000 mg | ORAL_TABLET | Freq: Four times a day (QID) | ORAL | Status: DC
  Administered 2022-03-05 – 2022-03-07 (×9): 1000 mg via ORAL
  Filled 2022-03-05 (×10): qty 2

## 2022-03-05 MED ORDER — LIDOCAINE-EPINEPHRINE 1 %-1:100000 IJ SOLN
20.0000 mL | Freq: Once | INTRAMUSCULAR | Status: AC
  Administered 2022-03-05: 20 mL via INTRADERMAL
  Filled 2022-03-05: qty 20

## 2022-03-05 MED ORDER — ALBUTEROL SULFATE HFA 108 (90 BASE) MCG/ACT IN AERS: 2.0000 | INHALATION_SPRAY | Freq: Four times a day (QID) | RESPIRATORY_TRACT | Status: DC | PRN

## 2022-03-05 MED ORDER — VANCOMYCIN HCL IN DEXTROSE 1-5 GM/200ML-% IV SOLN: 1000.0000 mg | Freq: Once | INTRAVENOUS | Status: DC

## 2022-03-05 MED ORDER — ONDANSETRON 4 MG PO TBDP: 4.0000 mg | ORAL_TABLET | Freq: Four times a day (QID) | ORAL | Status: DC | PRN

## 2022-03-05 NOTE — Assessment & Plan Note (Signed)
-   We will continue statin therapy. 

## 2022-03-05 NOTE — Assessment & Plan Note (Signed)
-   We will continue his diuretic therapy as well as Cozaar.

## 2022-03-05 NOTE — Progress Notes (Signed)
Pharmacy Antibiotic Note  [name] Browning is a 82 y.o. male admitted on 03/04/2022 with cellulitis.  Pharmacy has been consulted for Cefepime & Vancomycin dosing for 7 days.  Plan: Cefepime 2 gm q12hr per indication & renal fxn.  Pt given Vancomycin 1750 mg once. Vancomycin 1000 mg IV Q 24 hrs. Goal AUC 400-550. Expected AUC: 465.1 Expected Css: 12.0 SCr used: 1.21, TBW 74 kg < IBW 82.2 kg  Pharmacy will continue to follow and will adjust abx dosing whenever warranted.  Temp (24hrs), Avg:97.6 F (36.4 C), Min:97.5 F (36.4 C), Max:97.7 F (36.5 C)   Recent Labs  Lab 03/04/22 1837 03/04/22 2356 03/05/22 0224 03/05/22 0417  WBC 11.6*  --   --  9.3  CREATININE 1.24  --   --  1.21  LATICACIDVEN  --  0.9 0.7  --      Estimated Creatinine Clearance: 51 mL/min (by C-G formula based on SCr of 1.21 mg/dL).    Allergies  Allergen Reactions   Hydromorphone     Hallucination Pt reports he doesn't know about this    Antimicrobials this admission: 2/23 Ceftriaxone >> x 1 dose 2/24 Vancomycin >> x 7 days 2/24 Cefepime >> x 7 days  Microbiology results: No lab cx currently order or pending at this time.  Thank you for allowing pharmacy to be a part of this patient's care.  Pearla Dubonnet, PharmD Clinical Pharmacist 03/05/2022 7:51 AM

## 2022-03-05 NOTE — Progress Notes (Signed)
BP low. Order recived for 500 ml bolus. Patient is itching all over. Order received for clairtin and benadryl

## 2022-03-05 NOTE — Assessment & Plan Note (Signed)
-   We will continue his inhalers. 

## 2022-03-05 NOTE — H&P (Addendum)
[place]   PATIENT NAME: [name] Browning    MR#:  [id number]  DATE OF BIRTH:  1940/02/18  DATE OF ADMISSION:  03/04/2022  PRIMARY CARE PHYSICIAN: [place]   Patient is coming from: Home  REQUESTING/REFERRING PHYSICIAN: Ward, Delice Bison, DO  CHIEF COMPLAINT:   Chief Complaint  Patient presents with   Abscess    HISTORY OF PRESENT ILLNESS:  [name] Browning is a 82 y.o. Caucasian male with medical history significant for coronary artery disease, diastolic CHF, CVA, COPD, hypertension, dyslipidemia, urolithiasis, osteoarthritis, TIA and anxiety, who presented to the ER with acute onset of Left groin swelling with associated redness, warmth and tenderness with pain that started on Sunday when he scratched his left groin.  He thought that he could have had an insect bite.  He denies any fever or chills.  No nausea or vomiting or abdominal pain.  No chest pain or dyspnea or cough or wheezing.  No dysuria, oliguria or hematuria or flank pain.  He denies any current drainage or throbbing pain.  ED Course: When he presented to the ER, vital signs revealed a pulse currently of 100% on 2 L O2 by nasal cannula with temperature of 97.5.  Labs revealed mild hypokalemia with potassium of 3.4 and hypochloremia with chloride of 94.  Blood glucose was 137 and BUN 26 with alk phos 149.  Lactic acid was 0.9 L 0.7.  CBC showed leukocytosis of 11.6 with mild anemia.  Imaging: Abdominal and pelvic CT scan revealed the following: 1. Moderate severity left groin cellulitis without evidence of an associated fluid collection or abscess. 2. Bilateral nonobstructing renal calculi. 3. Colonic diverticulosis. 4. Moderate to marked severity prostate gland enlargement. 5. Chronic compression fracture deformity of the L4 vertebral body. 6. Aortic atherosclerosis.  The patient was given IV Rocephin and vancomycin.  He will be admitted to a medical bed for further evaluation and management. PAST MEDICAL  HISTORY:   Past Medical History:  Diagnosis Date   Anginal pain ([place])    Anxiety    Aortic atherosclerosis ([place])    Atrial fibrillation ([place]) 01/2019   Bilateral carpal tunnel syndrome    CAD (coronary artery disease)    a.) LHC 2004 --> normal coronaries. b.) normal stress test in 2007 and 2011; c.) Lexiscan 05/20/2014 --> LVEF 55-65%; no significant stress induced ischemia/arrythmia. d.) CT chest 03/25/2019 --> coronaries carcified.   Carotid atherosclerosis, bilateral    Carpal tunnel syndrome, bilateral    CHF (congestive heart failure) (HCC)    Chronic anticoagulation    a.) ASA + apixaban   COPD (chronic obstructive pulmonary disease) (HCC)    CVA (cerebral vascular accident) ([place])    Degenerative disc disease, cervical    Diastolic dysfunction    a.) TTE 05/29/2014 --> LVEF 60-65%; G1DD.   DVT (deep venous thrombosis) (HCC)    GERD (gastroesophageal reflux disease)    History of 2019 novel coronavirus disease (COVID-19) 02/08/2019   HLD (hyperlipidemia)    Hypertension    Kidney stones    Osteoarthritis of right shoulder    Pneumonia    Respiratory failure, acute ([place]) 09/20/2020   a.) severe respiratory distress 1 hour after urological surgery. CXR (+) for acute pulmonary edema. Transferred to ICU and placed on NIPPV. Questionable aspiration PNA. (+) A.fib with RVR. Improved with ABX, diuresis, and amiodarone.   Skin cancer of face    a.) RIGHT ear and RIGHT forehead; excised.   Syncope  TIA (transient ischemic attack) 2016   Valvular regurgitation    a.) TTE 05/26/2014 --> LVEF 60-65%; trivial MR, mild TR; no AR or PR. b.) TTE 04/20/2015 --> LVEF 55-60%; trivial MR and PR; no AR or TR.    PAST SURGICAL HISTORY:   Past Surgical History:  Procedure Laterality Date   CARDIAC CATHETERIZATION  2004   CARPAL TUNNEL RELEASE Right 06/11/2013   CENTRAL LINE INSERTION  11/14/2020   Procedure: CENTRAL LINE INSERTION;  Surgeon: Leonie Man, MD;  Location: [place]  [facility];  Service: Cardiovascular;;   CORONARY/GRAFT ACUTE MI REVASCULARIZATION N/A 11/14/2020   Procedure: Coronary/Graft Acute MI Revascularization;  Surgeon: Leonie Man, MD;  Location: [facility];  Service: Cardiovascular;  Laterality: N/A;   CYSTOSCOPY W/ RETROGRADES  10/16/2020   Procedure: CYSTOSCOPY WITH RETROGRADE PYELOGRAM;  Surgeon: Billey Co, MD;  Location: [facility];  Service: Urology;;   CYSTOSCOPY W/ URETERAL STENT PLACEMENT Left 09/20/2020   Procedure: CYSTOSCOPY WITH RETROGRADE PYELOGRAM/URETERAL STENT PLACEMENT;  Surgeon: Janith Lima, MD;  Location: [facility];  Service: Urology;  Laterality: Left;   CYSTOSCOPY/URETEROSCOPY/HOLMIUM LASER/STENT PLACEMENT Left 10/16/2020   Procedure: CYSTOSCOPY/URETEROSCOPY/HOLMIUM LASER/STENT PLACEMENT;  Surgeon: Billey Co, MD;  Location: [facility];  Service: Urology;  Laterality: Left;   ESOPHAGOGASTRODUODENOSCOPY N/A 11/06/2020   Procedure: ESOPHAGOGASTRODUODENOSCOPY (EGD);  Surgeon: Lucilla Lame, MD;  [facility]: [facility];  Service: Endoscopy;  Laterality: N/A;   LEFT HEART CATH AND CORONARY ANGIOGRAPHY N/A 11/14/2020   Procedure: LEFT HEART CATH AND CORONARY ANGIOGRAPHY;  Surgeon: Leonie Man, MD;  Location: [place] [facility];  Service: Cardiovascular;  Laterality: N/A;   LEFT HEART CATH AND CORONARY ANGIOGRAPHY Left 01/28/2022   Procedure: LEFT HEART CATH AND CORONARY ANGIOGRAPHY;  Surgeon: Dionisio David, MD;  Location: [place] [facility];  Service: Cardiovascular;  Laterality: Left;   SHOULDER ARTHROSCOPY WITH OPEN ROTATOR CUFF REPAIR Right 08/24/2017   Procedure: SHOULDER ARTHROSCOPY WITH OPEN ROTATOR CUFF REPAIR;  Surgeon: Corky Mull, MD;  Location: [facility];  Service: Orthopedics;  Laterality: Right;   SKIN CANCER EXCISION  12/01/2016   right ear    SKIN CANCER EXCISION     remove from the right side of the face    THROMBECTOMY Right 2004   leg   THYROIDECTOMY  1950   Not sure if total or  partial thyroidectomy.    SOCIAL HISTORY:   Social History   Tobacco Use   Smoking status: Former    Packs/day: 1.00    Years: 46.00    Total pack years: 46.00    Types: Cigarettes    Start date: 01/10/1957    Quit date: 02/11/2003    Years since quitting: 19.0    Passive exposure: Past   Smokeless tobacco: Former    Types: Snuff    Quit date: 02/11/2003  Substance Use Topics   Alcohol use: Not Currently    Alcohol/week: 7.0 standard drinks of alcohol    Types: 7 Cans of beer per week    FAMILY HISTORY:   Family History  Problem Relation Age of Onset   Hypertension Mother    Heart disease Mother    CAD Father    Heart attack Father     DRUG ALLERGIES:   Allergies  Allergen Reactions   Hydromorphone     Hallucination Pt reports he doesn't know about this    REVIEW OF SYSTEMS:   ROS As per history of present illness. All pertinent systems  were reviewed above. Constitutional, HEENT, cardiovascular, respiratory, GI, GU, musculoskeletal, neuro, psychiatric, endocrine, integumentary and hematologic systems were reviewed and are otherwise negative/unremarkable except for positive findings mentioned above in the HPI.   MEDICATIONS AT HOME:   Prior to Admission medications   Medication Sig Start Date End Date Taking? Authorizing Provider  acetaminophen (TYLENOL) 500 MG tablet Take 2 tablets (1,000 mg total) by mouth every 6 (six) hours as needed for mild pain. 05/07/20   Olean Ree, MD  albuterol (VENTOLIN HFA) 108 (90 Base) MCG/ACT inhaler Inhale into the lungs every 6 (six) hours as needed for wheezing or shortness of breath.    [provider]  amoxicillin-clavulanate (AUGMENTIN) 875-125 MG tablet Take 1 tablet by mouth 2 (two) times daily.    [provider]  aspirin EC 81 MG tablet Take 81 mg by mouth daily after supper.    [provider]  atorvastatin (LIPITOR) 40 MG tablet Take 40 mg by mouth daily after supper.    [provider]  Budeson-Glycopyrrol-Formoterol (BREZTRI AEROSPHERE) 160-9-4.8 MCG/ACT AERO Inhale 2 puffs into the lungs in the morning and at bedtime. 02/17/21   Flora Lipps, MD  cetirizine (ZYRTEC) 10 MG tablet Take 10 mg by mouth daily. 09/29/20   [provider]  clopidogrel (PLAVIX) 75 MG tablet Take 1 tablet (75 mg total) by mouth daily. 07/23/21   Alisa Graff, FNP  digoxin (LANOXIN) 0.125 MG tablet Take 125 mcg by mouth daily. Patient not taking: Reported on 03/05/2022 02/18/21   [provider]  famotidine (PEPCID) 20 MG tablet TAKE 1 TABLET BY MOUTH AFTER SUPPER 09/20/21   Flora Lipps, MD  ferrous gluconate (FERGON) 240 (27 FE) MG tablet Take 240 mg by mouth 3 (three) times daily with meals.    [provider]  fluticasone (FLONASE) 50 MCG/ACT nasal spray Place 1 spray into both nostrils daily. 03/20/21   [provider]  isosorbide mononitrate (IMDUR) 30 MG 24 hr tablet Take 30 mg by mouth daily. 02/01/22   [provider]  KLOR-CON M20 20 MEQ tablet TAKE 1 TABLET BY MOUTH EVERY DAY 09/15/21   Darylene Price A, FNP  losartan (COZAAR) 50 MG tablet Take 50 mg by mouth daily. 06/29/21   [provider]  magnesium oxide (MAG-OX) 400 MG tablet Take 400 mg by mouth daily. 03/21/21 03/21/22  [provider]  methenamine (HIPREX) 1 g tablet Take 1 tablet (1 g total) by mouth 2 (two) times daily with a meal. 02/02/22   Billey Co, MD  metoprolol succinate (TOPROL-XL) 50 MG 24 hr tablet Take 50 mg by mouth daily. 08/18/21   [provider]  montelukast (SINGULAIR) 10 MG tablet Take 10 mg by mouth at bedtime.    [provider]  nitroGLYCERIN (NITROSTAT) 0.4 MG SL tablet Place under the tongue. 03/20/21   [provider]  ondansetron (ZOFRAN) 4 MG tablet Take 1 tablet (4 mg total) by mouth every 6 (six) hours as needed for nausea or vomiting. 01/06/22   Billey Co, MD  ondansetron (ZOFRAN-ODT) 4 MG disintegrating tablet Take  1 tablet (4 mg total) by mouth every 6 (six) hours as needed for nausea or vomiting. 08/07/21   Ward, Delice Bison, DO  pantoprazole (PROTONIX) 40 MG tablet TAKE 1 TABLET BY MOUTH TWICE A DAY 02/17/22   Flora Lipps, MD  phenazopyridine (PYRIDIUM) 200 MG tablet Take 1 tablet (200 mg total) by mouth 3 (three) times daily as needed (bladder pain). 09/28/20  Pokhrel, Laxman, MD  ranolazine (RANEXA) 1000 MG SR tablet Take 1,000 mg by mouth 2 (two) times daily. 05/31/21   [provider]  roflumilast (DALIRESP) 500 MCG TABS tablet TAKE 1 TABLET BY MOUTH DAILY 01/04/22   Flora Lipps, MD  tamsulosin (FLOMAX) 0.4 MG CAPS capsule Take 1 capsule (0.4 mg total) by mouth daily. 02/02/22   Billey Co, MD  torsemide (DEMADEX) 20 MG tablet Take 3 tablets (60 mg total) by mouth daily. 06/01/21   Alisa Graff, FNP      VITAL SIGNS:  Blood pressure 110/63, pulse (!) 51, temperature 97.7 F (36.5 C), temperature source Oral, resp. rate 20, height '6\' 2"'$  (1.88 m), weight 74 kg, SpO2 100 %.  PHYSICAL EXAMINATION:  Physical Exam  GENERAL:  82 y.o.-year-old Caucasian male patient lying in the bed with no acute distress.  EYES: Pupils equal, round, reactive to light and accommodation. No scleral icterus. Extraocular muscles intact.  HEENT: Head atraumatic, normocephalic. Oropharynx and nasopharynx clear.  NECK:  Supple, no jugular venous distention. No thyroid enlargement, no tenderness.  LUNGS: Normal breath sounds bilaterally, no wheezing, rales,rhonchi or crepitation. No use of accessory muscles of respiration.  CARDIOVASCULAR: Regular rate and rhythm, S1, S2 normal. No murmurs, rubs, or gallops.  ABDOMEN: Soft, nondistended, nontender. Bowel sounds present. No organomegaly or mass.  EXTREMITIES: No pedal edema, cyanosis, or clubbing.  NEUROLOGIC: Cranial nerves II through XII are intact. Muscle strength 5/5 in all extremities. Sensation intact. Gait not checked.  PSYCHIATRIC: The patient is alert and  oriented x 3.  Normal affect and good eye contact. SKIN: No obvious rash, lesion, or ulcer.  Left groin swelling with erythema, warmth and tenderness and central scab without fluctuation yet.   LABORATORY PANEL:   CBC Recent Labs  Lab 03/05/22 0417  WBC 9.3  HGB 10.7*  HCT 33.4*  PLT 200   ------------------------------------------------------------------------------------------------------------------  Chemistries  Recent Labs  Lab 03/04/22 1837 03/05/22 0417  NA 137 138  K 3.4* 3.4*  CL 94* 99  CO2 27 28  GLUCOSE 137* 107*  BUN 26* 30*  CREATININE 1.24 1.21  CALCIUM 9.2 8.5*  AST 25  --   ALT 14  --   ALKPHOS 149*  --   BILITOT 1.2  --    ------------------------------------------------------------------------------------------------------------------  Cardiac Enzymes No results for input(s): "TROPONINI" in the last 168 hours. ------------------------------------------------------------------------------------------------------------------  RADIOLOGY:  CT ABDOMEN PELVIS W CONTRAST  Result Date: 03/04/2022 CLINICAL DATA:  Patient with recent history of left groin abscess, presenting with left groin pain. EXAM: CT ABDOMEN AND PELVIS WITH CONTRAST TECHNIQUE: Multidetector CT imaging of the abdomen and pelvis was performed using the standard protocol following bolus administration of intravenous contrast. RADIATION DOSE REDUCTION: This exam was performed according to the departmental dose-optimization program which includes automated exposure control, adjustment of the mA and/or kV according to patient size and/or use of iterative reconstruction technique. CONTRAST:  151m OMNIPAQUE IOHEXOL 300 MG/ML  SOLN COMPARISON:  August 07, 2021 FINDINGS: Lower chest: Emphysematous lung disease is seen within the bilateral lung bases. Hepatobiliary: No focal liver abnormality is seen. No gallstones, gallbladder wall thickening, or biliary dilatation. Pancreas: Unremarkable. No  pancreatic ductal dilatation or surrounding inflammatory changes. Spleen: A punctate calcified granuloma is seen within the parenchyma of an otherwise normal-appearing spleen. Adrenals/Urinary Tract: Adrenal glands are unremarkable. Kidneys are normal in size, without obstructing renal calculi, focal lesion, or hydronephrosis. A 1 mm nonobstructing renal calculus is seen within the upper pole of the  right kidney. A 10 mm nonobstructing renal calculus is seen within the lower pole of the left kidney. Additional 2 mm and 3 mm nonobstructing left renal calculi are also noted. There is mild diffuse urinary bladder wall thickening. Stomach/Bowel: Stomach is within normal limits. Appendix appears normal. No evidence of bowel wall thickening, distention, or inflammatory changes. Noninflamed diverticula are seen throughout the descending and sigmoid colon. Vascular/Lymphatic: Aortic atherosclerosis. No enlarged abdominal or pelvic lymph nodes. Reproductive: There is moderate to marked severity prostate gland enlargement with moderate severity prostate gland calcification. Other: Moderate severity subcutaneous inflammatory fat stranding is seen within the region of the left groin. Associated cutaneous thickening is noted. There is no evidence of associated fluid collection or abscess. Stable bilateral fat containing inguinal hernias are noted. No abdominopelvic ascites. Musculoskeletal: A chronic compression fracture deformity is seen involving the L4 vertebral body. Multilevel degenerative changes seen throughout the lumbar spine. IMPRESSION: 1. Moderate severity left groin cellulitis without evidence of an associated fluid collection or abscess. 2. Bilateral nonobstructing renal calculi. 3. Colonic diverticulosis. 4. Moderate to marked severity prostate gland enlargement. 5. Chronic compression fracture deformity of the L4 vertebral body. 6. Aortic atherosclerosis. Aortic Atherosclerosis (ICD10-I70.0). Electronically  Signed   By: Virgina Norfolk M.D.   On: 03/04/2022 22:57      IMPRESSION AND PLAN:  Assessment and Plan: * Cellulitis of left groin - The patient be admitted to a medical bed. - Will continue antibiotic therapy with IV cefepime and vancomycin. - Pain management will be provided. - Warm compresses will be applied. - General surgery consult to be obtained. - I notified Dr. Dahlia Byes about the patient.  Chronic diastolic CHF (congestive heart failure) (HCC) - We will continue his diuretic therapy as well as Cozaar.  Chronic obstructive pulmonary disease (COPD) (HCC) - We will continue his inhalers.  Coronary artery disease - We will continue his as aspirin, Plavix, Cozaar, Ranexa and statin therapy,  Dyslipidemia - We will continue statin therapy.    DVT prophylaxis: Lovenox.  Advanced Care Planning:  Code Status: full code.  Family Communication:  The plan of care was discussed in details with the patient (and family). I answered all questions. The patient agreed to proceed with the above mentioned plan. Further management will depend upon hospital course. Disposition Plan: Back to previous home environment Consults called: General surgery All the records are reviewed and case discussed with ED provider.  Status is: Inpatient   At the time of the admission, it appears that the appropriate admission status for this patient is inpatient.  This is judged to be reasonable and necessary in order to provide the required intensity of service to ensure the patient's safety given the presenting symptoms, physical exam findings and initial radiographic and laboratory data in the context of comorbid conditions.  The patient requires inpatient status due to high intensity of service, high risk of further deterioration and high frequency of surveillance required.  I certify that at the time of admission, it is my clinical judgment that the patient will require inpatient hospital care  extending more than 2 midnights.                            Dispo: The patient is from: Home              Anticipated d/c is to: Home              Patient currently is not  medically stable to d/c.              Difficult to place patient: No  Christel Mormon M.D on 03/05/2022 at 6:01 AM  [facility]   From 7 PM-7 AM, contact night-coverage [email or web address]  CC: Primary care physician; [place]

## 2022-03-05 NOTE — Progress Notes (Signed)
  PROGRESS NOTE    [name] Browning  [id number] DOB: 16-May-1940 DOA: 03/04/2022 PCP: [place], [place]  201A/201A-AA  LOS: 0 days   Brief hospital course:   Assessment & Plan: [name] Browning is a 82 y.o. Caucasian male with medical history significant for coronary artery disease, diastolic CHF, CVA, COPD on 2L O2, hypertension, who presented to the ER with acute onset of Left groin swelling with associated redness, warmth and tenderness with pain that started on Sunday (5 days ago) when he scratched his left groin.    * Cellulitis of groin with complex left inguinal abscess  --started on IV cefepime and vancomycin. Plan: --bedside I/D by Dr. Dahlia Byes today, Wound irrigated and 1/2 inch iodoform packing placed.  --cont vanc and cefepime for now --f/u abscess cx --cont packing and dressing change  Chronic diastolic CHF (congestive heart failure) (HCC) - stable --hold home torsemide  HTN --BP has been low today while on home Toprol, losartan and Imdur Plan: --NS 500 ml bolus x1 --hold torsemid --hold home losartan and Imdur --cont home Toprol  Chronic obstructive pulmonary disease (COPD) (HCC) On 2L O2 at baseline --stable  Coronary artery disease - Home plavix on hold for surgical I/D --cont ASA and statin  Dyslipidemia - continue statin therapy.  Pruritus --pt reported itching all over, no specific rash --start Claritin --Benadryl PRN   DVT prophylaxis: Lovenox SQ Code Status: Full code  Family Communication:  Level of care: Med-Surg Dispo:   The patient is from: home Anticipated d/c is to: home Anticipated d/c date is: likely tomorrow Patient currently is not medically ready to d/c due to: Just had I/D today, per GenSurg, keep today, and may discharge tomorrow pending GenSurg clearance.   Subjective and Interval History:  Pt received bedside I/D with GenSurg today.  Denied pain.   Objective: Vitals:   03/05/22 0805 03/05/22 1519 03/05/22 1609 03/05/22  1804  BP: (!) 112/52 (!) 87/47 (!) 84/42 (!) 94/58  Pulse:  61    Resp:  18    Temp:  97.6 F (36.4 C)    TempSrc:  Oral    SpO2:  100%    Weight:      Height:        Intake/Output Summary (Last 24 hours) at 03/05/2022 1917 Last data filed at 03/05/2022 1805 Gross per 24 hour  Intake 1822.25 ml  Output 825 ml  Net 997.25 ml   Filed Weights   03/04/22 1836 03/05/22 0218  Weight: 74.8 kg 74 kg    Examination:   Constitutional: NAD, AAOx3 HEENT: conjunctivae and lids normal, EOMI CV: No cyanosis.   RESP: normal respiratory effort, on 2L Neuro: II - XII grossly intact.   Psych: Normal mood and affect.  Appropriate judgement and reason   Data Reviewed: I have personally reviewed labs and imaging studies  Time spent: 50 minutes  Enzo Bi, MD [facility] If 7PM-7AM, please contact night-coverage 03/05/2022, 7:17 PM

## 2022-03-05 NOTE — Assessment & Plan Note (Signed)
-   The patient be admitted to a medical bed. - Will continue antibiotic therapy with IV cefepime and vancomycin. - Pain management will be provided. - Warm compresses will be applied. - General surgery consult to be obtained. - I notified Dr. Dahlia Byes about the patient.

## 2022-03-05 NOTE — Assessment & Plan Note (Signed)
-   We will continue his as aspirin, Plavix, Cozaar, Ranexa and statin therapy,

## 2022-03-05 NOTE — Consult Note (Signed)
Patient ID: [name] Browning, male   DOB: 11/05/1940, 82 y.o.   MRN: [id number]  HPI [name] Browning is a 82 y.o. male  with medical history significant for coronary artery disease, diastolic CHF, CVA, COPD, hypertension, dyslipidemia, urolithiasis, osteoarthritis, TIA and anxiety, who presented to the ER with acute onset of Left groin pain and swelling with associated redness, warmth  that started on Sunday .  Fortunately pain is moderate and worsening with movement.  Denies any fevers and chills.  Does take daily Plavix for his history of coronary artery disease and CVA.  He did have a CT scan that have personally reviewed showing evidence of cellulitis with significant induration in the left inguinal region. Labs revealed mild hypokalemia with potassium of 3.4 and hypochloremia with chloride of 94. Blood glucose was 137 and BUN 26 with alk phos 149. Lactic acid was 0.9 L 0.7. CBC showed leukocytosis of 11.6 with mild anemia  No nausea or vomiting or abdominal pain.  No chest pain or dyspnea or cough or wheezing.  No dysuria, oliguria or hematuria or flank pain.  He denies any current drainage    HPI  Past Medical History:  Diagnosis Date   Anginal pain ([place])    Anxiety    Aortic atherosclerosis (HCC)    Atrial fibrillation ([place]) 01/2019   Bilateral carpal tunnel syndrome    CAD (coronary artery disease)    a.) LHC 2004 --> normal coronaries. b.) normal stress test in 2007 and 2011; c.) Lexiscan 05/20/2014 --> LVEF 55-65%; no significant stress induced ischemia/arrythmia. d.) CT chest 03/25/2019 --> coronaries carcified.   Carotid atherosclerosis, bilateral    Carpal tunnel syndrome, bilateral    CHF (congestive heart failure) (HCC)    Chronic anticoagulation    a.) ASA + apixaban   COPD (chronic obstructive pulmonary disease) (HCC)    CVA (cerebral vascular accident) ([place])    Degenerative disc disease, cervical    Diastolic dysfunction    a.) TTE 05/29/2014 --> LVEF 60-65%; G1DD.   DVT (deep  venous thrombosis) (HCC)    GERD (gastroesophageal reflux disease)    History of 2019 novel coronavirus disease (COVID-19) 02/08/2019   HLD (hyperlipidemia)    Hypertension    Kidney stones    Osteoarthritis of right shoulder    Pneumonia    Respiratory failure, acute ([place]) 09/20/2020   a.) severe respiratory distress 1 hour after urological surgery. CXR (+) for acute pulmonary edema. Transferred to ICU and placed on NIPPV. Questionable aspiration PNA. (+) A.fib with RVR. Improved with ABX, diuresis, and amiodarone.   Skin cancer of face    a.) RIGHT ear and RIGHT forehead; excised.   Syncope    TIA (transient ischemic attack) 2016   Valvular regurgitation    a.) TTE 05/26/2014 --> LVEF 60-65%; trivial MR, mild TR; no AR or PR. b.) TTE 04/20/2015 --> LVEF 55-60%; trivial MR and PR; no AR or TR.    Past Surgical History:  Procedure Laterality Date   CARDIAC CATHETERIZATION  2004   CARPAL TUNNEL RELEASE Right 06/11/2013   CENTRAL LINE INSERTION  11/14/2020   Procedure: CENTRAL LINE INSERTION;  Surgeon: Leonie Man, MD;  Location: [place] [facility];  Service: Cardiovascular;;   CORONARY/GRAFT ACUTE MI REVASCULARIZATION N/A 11/14/2020   Procedure: Coronary/Graft Acute MI Revascularization;  Surgeon: Leonie Man, MD;  Location: [place];  Service: Cardiovascular;  Laterality: N/A;   CYSTOSCOPY W/ RETROGRADES  10/16/2020   Procedure: CYSTOSCOPY WITH RETROGRADE PYELOGRAM;  Surgeon: Billey Co, MD;  Location: [facility];  Service: Urology;;   CYSTOSCOPY W/ URETERAL STENT PLACEMENT Left 09/20/2020   Procedure: CYSTOSCOPY WITH RETROGRADE PYELOGRAM/URETERAL STENT PLACEMENT;  Surgeon: Janith Lima, MD;  Location: [facility];  Service: Urology;  Laterality: Left;   CYSTOSCOPY/URETEROSCOPY/HOLMIUM LASER/STENT PLACEMENT Left 10/16/2020   Procedure: CYSTOSCOPY/URETEROSCOPY/HOLMIUM LASER/STENT PLACEMENT;  Surgeon: Billey Co, MD;  Location: [facility];  Service: Urology;   Laterality: Left;   ESOPHAGOGASTRODUODENOSCOPY N/A 11/06/2020   Procedure: ESOPHAGOGASTRODUODENOSCOPY (EGD);  Surgeon: Lucilla Lame, MD;  Location: [facility];  Service: Endoscopy;  Laterality: N/A;   LEFT HEART CATH AND CORONARY ANGIOGRAPHY N/A 11/14/2020   Procedure: LEFT HEART CATH AND CORONARY ANGIOGRAPHY;  Surgeon: Leonie Man, MD;  Location: [place] [facility];  Service: Cardiovascular;  Laterality: N/A;   LEFT HEART CATH AND CORONARY ANGIOGRAPHY Left 01/28/2022   Procedure: LEFT HEART CATH AND CORONARY ANGIOGRAPHY;  Surgeon: Dionisio David, MD;  Location: [place] [facility];  Service: Cardiovascular;  Laterality: Left;   SHOULDER ARTHROSCOPY WITH OPEN ROTATOR CUFF REPAIR Right 08/24/2017   Procedure: SHOULDER ARTHROSCOPY WITH OPEN ROTATOR CUFF REPAIR;  Surgeon: Corky Mull, MD;  Location: [facility];  Service: Orthopedics;  Laterality: Right;   SKIN CANCER EXCISION  12/01/2016   right ear    SKIN CANCER EXCISION     remove from the right side of the face    THROMBECTOMY Right 2004   leg   THYROIDECTOMY  1950   Not sure if total or partial thyroidectomy.    Family History  Problem Relation Age of Onset   Hypertension Mother    Heart disease Mother    CAD Father    Heart attack Father     Social History Social History   Tobacco Use   Smoking status: Former    Packs/day: 1.00    Years: 46.00    Total pack years: 46.00    Types: Cigarettes    Start date: 01/10/1957    Quit date: 02/11/2003    Years since quitting: 19.0    Passive exposure: Past   Smokeless tobacco: Former    Types: Snuff    Quit date: 02/11/2003  Vaping Use   Vaping Use: Never used  Substance Use Topics   Alcohol use: Not Currently    Alcohol/week: 7.0 standard drinks of alcohol    Types: 7 Cans of beer per week   Drug use: No    Allergies  Allergen Reactions   Hydromorphone     Hallucination Pt reports he doesn't know about this    Current Facility-Administered Medications   Medication Dose Route Frequency Provider Last Rate Last Admin   0.9 %  sodium chloride infusion   Intravenous PRN Enzo Bi, MD       acetaminophen (TYLENOL) tablet 1,000 mg  1,000 mg Oral Q6H Renee Erb F, MD   1,000 mg at 03/05/22 1041   albuterol (PROVENTIL) (2.5 MG/3ML) 0.083% nebulizer solution 2.5 mg  2.5 mg Nebulization Q6H PRN Renda Rolls, RPH       aspirin EC tablet 81 mg  81 mg Oral QPC supper Mansy, Jan A, MD       atorvastatin (LIPITOR) tablet 40 mg  40 mg Oral QPC supper Mansy, Jan A, MD       ceFEPIme (MAXIPIME) 2 g in sodium chloride 0.9 % 100 mL IVPB  2 g Intravenous Q12H Renda Rolls, RPH 200 mL/hr at 03/05/22 1046 Infusion Verify at 03/05/22 1046  Chlorhexidine Gluconate Cloth 2 % PADS 6 each  6 each Topical Q0600 Brannan Cassedy, [place] F, MD   6 each at 03/05/22 1052   enoxaparin (LOVENOX) injection 40 mg  40 mg Subcutaneous Q24H Mansy, Jan A, MD   40 mg at 03/05/22 1044   famotidine (PEPCID) tablet 20 mg  20 mg Oral QHS Mansy, Jan A, MD       ferrous gluconate (FERGON) tablet 324 mg  324 mg Oral TID WC Mansy, Jan A, MD   324 mg at 03/05/22 1043   isosorbide mononitrate (IMDUR) 24 hr tablet 30 mg  30 mg Oral Daily Mansy, Jan A, MD   30 mg at 03/05/22 1042   losartan (COZAAR) tablet 50 mg  50 mg Oral Daily Mansy, Jan A, MD   50 mg at 03/05/22 1041   magnesium hydroxide (MILK OF MAGNESIA) suspension 30 mL  30 mL Oral Daily PRN Mansy, Jan A, MD       magnesium oxide (MAG-OX) tablet 400 mg  400 mg Oral Daily Mansy, Jan A, MD   400 mg at 03/05/22 1042   methenamine (MANDELAMINE) tablet 1 g  1 g Oral BID WC Mansy, Jan A, MD   1 g at 03/05/22 0816   metoprolol succinate (TOPROL-XL) 24 hr tablet 50 mg  50 mg Oral Daily Mansy, Jan A, MD   50 mg at 03/05/22 1041   nitroGLYCERIN (NITROSTAT) SL tablet 0.4 mg  0.4 mg Sublingual Q5 min PRN Mansy, Jan A, MD       ondansetron [facility]) tablet 4 mg  4 mg Oral Q6H PRN Mansy, Jan A, MD       Or   ondansetron [facility]) injection 4 mg  4 mg  Intravenous Q6H PRN Mansy, Arvella Merles, MD       Oral care mouth rinse  15 mL Mouth Rinse PRN Enzo Bi, MD       oxyCODONE (Oxy IR/ROXICODONE) immediate release tablet 5 mg  5 mg Oral Q4H PRN Shevaun Lovan F, MD   5 mg at 03/05/22 1050   pantoprazole (PROTONIX) EC tablet 40 mg  40 mg Oral BID Mansy, Jan A, MD   40 mg at 03/05/22 1041   phenazopyridine (PYRIDIUM) tablet 200 mg  200 mg Oral TID PRN Mansy, Jan A, MD       tamsulosin [facility]) capsule 0.4 mg  0.4 mg Oral Daily Mansy, Jan A, MD   0.4 mg at 03/05/22 1042   traZODone (DESYREL) tablet 25 mg  25 mg Oral QHS PRN Mansy, Jan A, MD       [START ON 03/06/2022] vancomycin (VANCOCIN) IVPB 1000 mg/200 mL premix  1,000 mg Intravenous Q24H Nazari, Walid A, RPH       Facility-Administered Medications Ordered in Other Encounters  Medication Dose Route Frequency Provider Last Rate Last Admin   sodium chloride flush (NS) 0.9 % injection 3 mL  3 mL Intravenous Q12H Scoggins, Amber, NP         Review of Systems Full ROS  was asked and was negative except for the information on the HPI  Physical Exam Blood pressure (!) 112/52, pulse (!) 56, temperature (!) 97.5 F (36.4 C), temperature source Oral, resp. rate 18, height '6\' 2"'$  (1.88 m), weight 74 kg, SpO2 100 %. CONSTITUTIONAL: NAD. EYES: Pupils are equal, round, and reactive to light, Sclera are non-icteric. EARS, NOSE, MOUTH AND THROAT: The oropharynx is clear. The oral mucosa is pink and moist. Hearing is intact to voice. LYMPH  NODES:  Lymph nodes in the neck are normal. RESPIRATORY:  Lungs are clear. There is normal respiratory effort, with equal breath sounds bilaterally, and without pathologic use of accessory muscles. CARDIOVASCULAR: Heart is regular without murmurs, gallops, or rubs. GI: The abdomen is  soft, nontender, and nondistended. There are no palpable masses. There is no hepatosplenomegaly. There are normal bowel sounds in all quadrants. GU: Rectal deferred.   MUSCULOSKELETAL: Normal  muscle strength and tone. No cyanosis or edema.   SKIN: Evidence of cellulitis and induration and edema on the left inguinal region.  There is squeeze it tenderness to palpation.  There is no evidence of necrotizing infection.  NEUROLOGIC: Motor and sensation is grossly normal. Cranial nerves are grossly intact. PSYCH:  Oriented to person, place and time. Affect is normal.  Data Reviewed  I have personally reviewed the patient's imaging, laboratory findings and medical records.    Assessment/Plan 82 year old male with cellulitis and abscess of the left inguinal region.  Discussed with the patient in detail I do think that he needs a formal I&D.  He has been on Plavix but I think the benefits of source control Vida Rigger outweigh the potential risk of bleeding.  I have discussed with him in detail and we will hold off Plavix for today.  He is okay with me doing this at the bedside.  Procedure discussed with him in detail.  Risk, benefits and possible medications including but not limited to: Bleeding, infection, pain, recurrence.  He understands and wished to proceed. I Spent 75 minutes in this encounter including personally reviewing imaging studies, reviewing medical record, counseling the patient, placing orders and performing appropriate documentation   PROCEDURES 1.  Incision and drainage of complex left inguinal abscess 2.  Excisional debridement of the left inguinal region down to the fascia measuring 6 cm  ANESTHESIA: Lidocaine 1% with epinephrine 20cc  EBL: Minimal  FINDINGS: abscess  After informed consent was obtained the patient was prepped and draped in the usual sterile fashion.  Lidocaine 1% was injected over the area of interest.  15 blade knife used to create an elliptical incision .  The abscess was drained with about 5 cc of pus obtained.  We obtain appropriate cultures.  Using the knife on the back of the [place] I was able to perform an excisional debridement of the  subcutaneous tissue down to the fascia.  Appropriate hemostasis obtained with pressure. Wound irrigated and 1/2 inch iodoform packing placed. No complications. The  patient tolerated procedure well  Caroleen Hamman, MD FACS General Surgeon 03/05/2022, 1:35 PM

## 2022-03-05 NOTE — Progress Notes (Signed)
Pharmacy Antibiotic Note  [name] Browning is a 82 y.o. male admitted on 03/04/2022 with cellulitis.  Pharmacy has been consulted for Cefepime & Vancomycin dosing for 7 days.  Plan: Cefepime 2 gm q12hr per indication & renal fxn.  Pt given Vancomycin 1750 mg once. Vancomycin 1250 mg IV Q 24 hrs. Goal AUC 400-550. Expected AUC: 455.6 SCr used: 1.24, TBW 78.8 kg < IBW 82.2 kg  Pharmacy will continue to follow and will adjust abx dosing whenever warranted.  Temp (24hrs), Avg:97.6 F (36.4 C), Min:97.5 F (36.4 C), Max:97.6 F (36.4 C)   Recent Labs  Lab 03/04/22 1837 03/04/22 2356  WBC 11.6*  --   CREATININE 1.24  --   LATICACIDVEN  --  0.9    Estimated Creatinine Clearance: 50.3 mL/min (by C-G formula based on SCr of 1.24 mg/dL).    Allergies  Allergen Reactions   Hydromorphone     Hallucination Pt reports he doesn't know about this    Antimicrobials this admission: 2/23 Ceftriaxone >> x 1 dose 2/24 Vancomycin >> x 7 days 2/24 Cefepime >> x 7 days  Microbiology results: No lab cx currently order or pending at this time.  Thank you for allowing pharmacy to be a part of this patient's care.  Renda Rolls, PharmD, MBA 03/05/2022 2:25 AM

## 2022-03-06 DIAGNOSIS — L03314 Cellulitis of groin: Secondary | ICD-10-CM | POA: Diagnosis not present

## 2022-03-06 LAB — BASIC METABOLIC PANEL
Anion gap: 7 (ref 5–15)
BUN: 39 mg/dL — ABNORMAL HIGH (ref 8–23)
CO2: 24 mmol/L (ref 22–32)
Calcium: 8.5 mg/dL — ABNORMAL LOW (ref 8.9–10.3)
Chloride: 101 mmol/L (ref 98–111)
Creatinine, Ser: 1.57 mg/dL — ABNORMAL HIGH (ref 0.61–1.24)
GFR, Estimated: 44 mL/min — ABNORMAL LOW (ref >60)
Glucose, Bld: 116 mg/dL — ABNORMAL HIGH (ref 70–99)
Potassium: 4.2 mmol/L (ref 3.5–5.1)
Sodium: 132 mmol/L — ABNORMAL LOW (ref 135–145)

## 2022-03-06 LAB — CBC
HCT: 29 % — ABNORMAL LOW (ref 39.0–52.0)
Hemoglobin: 9.2 g/dL — ABNORMAL LOW (ref 13.0–17.0)
MCH: 28.5 pg (ref 26.0–34.0)
MCHC: 31.7 g/dL (ref 30.0–36.0)
MCV: 89.8 fL (ref 80.0–100.0)
Platelets: 182 10*3/uL (ref 150–400)
RBC: 3.23 MIL/uL — ABNORMAL LOW (ref 4.22–5.81)
RDW: 13.3 % (ref 11.5–15.5)
WBC: 7 10*3/uL (ref 4.0–10.5)
nRBC: 0 % (ref 0.0–0.2)

## 2022-03-06 LAB — MAGNESIUM: Magnesium: 2.1 mg/dL (ref 1.7–2.4)

## 2022-03-06 MED ORDER — DOXYCYCLINE HYCLATE 100 MG PO TABS
100.0000 mg | ORAL_TABLET | Freq: Two times a day (BID) | ORAL | Status: DC
  Administered 2022-03-06 – 2022-03-07 (×3): 100 mg via ORAL
  Filled 2022-03-06 (×3): qty 1

## 2022-03-06 MED ORDER — SODIUM CHLORIDE 0.9 % IV BOLUS
500.0000 mL | Freq: Once | INTRAVENOUS | Status: AC
  Administered 2022-03-06: 500 mL via INTRAVENOUS

## 2022-03-06 MED ORDER — CLOPIDOGREL BISULFATE 75 MG PO TABS
75.0000 mg | ORAL_TABLET | Freq: Every day | ORAL | Status: DC
  Administered 2022-03-07: 75 mg via ORAL
  Filled 2022-03-06: qty 1

## 2022-03-06 NOTE — Progress Notes (Signed)
       CROSS COVER NOTE  NAME: [name] Browning MRN: [id number] DOB : 01-23-1940    HPI/Events of Note   Nurse reported hypotension  Assessment and  Interventions   Assessment:  Plan: 2 500 ml ns boluses ordered       Kathlene Cote NP [facility]

## 2022-03-06 NOTE — Progress Notes (Signed)
   03/06/22 0546  Assess: MEWS Score  Temp (!) 97.4 F (36.3 C)  BP (!) 76/41  MAP (mmHg) (!) 50  Pulse Rate (!) 53  Resp 16  Level of Consciousness Alert  SpO2 99 %  O2 Device Nasal Cannula  Assess: MEWS Score  MEWS Temp 0  MEWS Systolic 2  MEWS Pulse 0  MEWS RR 0  MEWS LOC 0  MEWS Score 2  MEWS Score Color Yellow  Assess: if the MEWS score is Yellow or Red  Were vital signs taken at a resting state? Yes  Focused Assessment Change from prior assessment (see assessment flowsheet) (c/o dizziness)  Does the patient meet 2 or more of the SIRS criteria? No  MEWS guidelines implemented  Yes, yellow  Treat  MEWS Interventions Considered administering scheduled or prn medications/treatments as ordered  Take Vital Signs  Increase Vital Sign Frequency  Yellow: Q2hr x1, continue Q4hrs until patient remains green for 12hrs  Escalate  MEWS: Escalate Yellow: Discuss with charge nurse and consider notifying provider and/or RRT  Notify: Charge Nurse/RN  Name of Charge Nurse/RN Notified Almyra Free, RN  Provider Notification  Provider Name/Title B. Morrisone  Date Provider Notified 03/06/22  Time Provider Notified [phone]  Method of Notification Page  Notification Reason Change in status  Provider response See new orders  Date of Provider Response 03/06/22  Time of Provider Response 0555  Assess: SIRS CRITERIA  SIRS Temperature  0  SIRS Pulse 0  SIRS Respirations  0  SIRS WBC 0  SIRS Score Sum  0

## 2022-03-06 NOTE — Progress Notes (Signed)
[facility] [place] NAME: [name] Browning    MR#:  [id number]  DATE OF BIRTH:  [birth year]  SUBJECTIVE:  No new complaints spoke with family on the phone. Left groin area looks better No fever    VITALS:  Blood pressure 95/61, pulse 63, temperature (!) 97.4 F (36.3 C), resp. rate 20, height '6\' 2"'$  (1.88 m), weight 74 kg, SpO2 99 %.  PHYSICAL EXAMINATION:   GENERAL:  82 y.o.-year-old patient with no acute distress.  LUNGS: Normal breath sounds bilaterally, no wheezing CARDIOVASCULAR: S1, S2 normal. No murmur   ABDOMEN: Soft, nontender, nondistended. Bowel sounds present.  EXTREMITIES:left groin are stable dressing + NEUROLOGIC: nonfocal  patient is alert and awake SKIN: No obvious rash, lesion, or ulcer.   LABORATORY PANEL:  CBC Recent Labs  Lab 03/06/22 0359  WBC 7.0  HGB 9.2*  HCT 29.0*  PLT 182    Chemistries  Recent Labs  Lab 03/04/22 1837 03/05/22 0417 03/06/22 0359  NA 137   < > 132*  K 3.4*   < > 4.2  CL 94*   < > 101  CO2 27   < > 24  GLUCOSE 137*   < > 116*  BUN 26*   < > 39*  CREATININE 1.24   < > 1.57*  CALCIUM 9.2   < > 8.5*  MG  --   --  2.1  AST 25  --   --   ALT 14  --   --   ALKPHOS 149*  --   --   BILITOT 1.2  --   --    < > = values in this interval not displayed.   Cardiac Enzymes No results for input(s): "TROPONINI" in the last 168 hours. RADIOLOGY:  CT ABDOMEN PELVIS W CONTRAST  Result Date: 03/04/2022 CLINICAL DATA:  Patient with recent history of left groin abscess, presenting with left groin pain. EXAM: CT ABDOMEN AND PELVIS WITH CONTRAST TECHNIQUE: Multidetector CT imaging of the abdomen and pelvis was performed using the standard protocol following bolus administration of intravenous contrast. RADIATION DOSE REDUCTION: This exam was performed according to the departmental dose-optimization program which includes automated exposure control, adjustment of the mA and/or kV according to patient  size and/or use of iterative reconstruction technique. CONTRAST:  133m OMNIPAQUE IOHEXOL 300 MG/ML  SOLN COMPARISON:  August 07, 2021 FINDINGS: Lower chest: Emphysematous lung disease is seen within the bilateral lung bases. Hepatobiliary: No focal liver abnormality is seen. No gallstones, gallbladder wall thickening, or biliary dilatation. Pancreas: Unremarkable. No pancreatic ductal dilatation or surrounding inflammatory changes. Spleen: A punctate calcified granuloma is seen within the parenchyma of an otherwise normal-appearing spleen. Adrenals/Urinary Tract: Adrenal glands are unremarkable. Kidneys are normal in size, without obstructing renal calculi, focal lesion, or hydronephrosis. A 1 mm nonobstructing renal calculus is seen within the upper pole of the right kidney. A 10 mm nonobstructing renal calculus is seen within the lower pole of the left kidney. Additional 2 mm and 3 mm nonobstructing left renal calculi are also noted. There is mild diffuse urinary bladder wall thickening. Stomach/Bowel: Stomach is within normal limits. Appendix appears normal. No evidence of bowel wall thickening, distention, or inflammatory changes. Noninflamed diverticula are seen throughout the descending and sigmoid colon. Vascular/Lymphatic: Aortic atherosclerosis. No enlarged abdominal or pelvic lymph nodes. Reproductive: There is moderate to marked severity prostate gland enlargement with moderate severity prostate gland calcification. Other: Moderate severity subcutaneous inflammatory fat stranding is  seen within the region of the left groin. Associated cutaneous thickening is noted. There is no evidence of associated fluid collection or abscess. Stable bilateral fat containing inguinal hernias are noted. No abdominopelvic ascites. Musculoskeletal: A chronic compression fracture deformity is seen involving the L4 vertebral body. Multilevel degenerative changes seen throughout the lumbar spine. IMPRESSION: 1. Moderate  severity left groin cellulitis without evidence of an associated fluid collection or abscess. 2. Bilateral nonobstructing renal calculi. 3. Colonic diverticulosis. 4. Moderate to marked severity prostate gland enlargement. 5. Chronic compression fracture deformity of the L4 vertebral body. 6. Aortic atherosclerosis. Aortic Atherosclerosis (ICD10-I70.0). Electronically Signed   By: Virgina Norfolk M.D.   On: 03/04/2022 22:57    Assessment and Plan  [name] Browning is a 82 y.o. Caucasian male with medical history significant for coronary artery disease, diastolic CHF, CVA, COPD on 2L O2, hypertension, who presented to the ER with acute onset of Left groin swelling with associated redness, warmth and tenderness with pain that started on Sunday (5 days ago) when he scratched his left groin.    Cellulitis of groin with complex left inguinal abscess  --started on IV cefepime and vancomycin. --bedside I/D by Dr. Dahlia Byes today, Wound irrigated and 1/2 inch iodoform packing placed.   --change to po doxycycline --f/u abscess cx--GPC --cont packing and dressing change --ok to d/c per dr pabon--dressing instructions per surgery   Chronic diastolic CHF (congestive heart failure) ([place]) - stable --hold home torsemide   HTN --BP has been low today while on home Toprol, losartan  --pt grand-dter pt is not taking Imdur --hold torsemide --hold home losartan  and Toprol for today   Chronic obstructive pulmonary disease (COPD) ([place]) On 2L O2 at baseline --stable   Coronary artery disease - Home plavix on hold for surgical I/D--will resume from 2/26 --cont ASA and statin   Dyslipidemia - continue statin therapy.   Pruritus --pt reported itching all over, no specific rash --start Claritin --Benadryl PRN     DVT prophylaxis: Lovenox SQ Code Status: Full code  Family Communication: April--grand-dter Level of care: Med-Surg Dispo:   The patient is from: home Anticipated d/c is to: home likely in  am Monitoring pt's bp one more day       TOTAL TIME TAKING CARE OF THIS PATIENT: 35 minutes.  >50% time spent on counselling and coordination of care  Note: This dictation was prepared with Dragon dictation along with smaller phrase technology. Any transcriptional errors that result from this process are unintentional.  Fritzi Mandes M.D    [facility]   CC: Primary care physician; [place], [place]

## 2022-03-06 NOTE — Progress Notes (Signed)
BP 95/61 before ambulating to the bathroom. he said he was slightly dizzy when he sat up but it passed. He was fine going to the bathroom but said he was dizzy coming back. he was staggering. I sat him down then checked BP and got 104/58. He said he gets dizzy at home too. Dr Posey Pronto notified and will not plan to discharge the patient today

## 2022-03-06 NOTE — Progress Notes (Signed)
Pt seen and examined S/p I/D gram + from gram stain Exam. Cellulitis improving, still tender, no evidence of necrotizing infection   A/p daily packing  Will need a/bs We will follow as outpt

## 2022-03-06 NOTE — Discharge Instructions (Addendum)
Wound Packing Wound packing usually involves placing a moistened packing material into your wound and then covering it with an outer bandage (dressing). This helps support the healing of deep tissue and tissue under the skin. It also helps prevent bleeding, infection, and further injury. Wounds are packed until deep tissues heal. The time it takes for this to happen is different for everyone. Your health care provider will show you how to pack and dress your wound. Using gloves and a clean technique is important to avoid spreading germs into your wound. Supplies needed: Soap and water. Disposable gloves. Cleansing or wetting solution, such as saline, germ-free (sterile) water, or an antiseptic solution. Clean bowl. Clean packing material, such as gauze, gauze sponges, or rolled gauze. Clean paper towels. Outer dressing. This includes the cover dressing and tape, or a dressing with an adhesive border. Cotton-tipped swabs. Small plastic bag for trash. How to pack your wound Follow your health care provider's instructions on how often you need to change dressings and pack your wound. You will likely be asked to change your dressings 1 to 2 times a day. Preparing to change the wound packing If needed, take pain medicine 30 minutes before you pack your wound as told by your health care provider. Preparing the new packing material  Clean and disinfect your work surface or countertop. Set a plastic bag on or near your work surface. Wash your hands with soap and water for at least 20 seconds before you change the dressing. If soap and water are not available, use hand sanitizer. Put a clean paper towel on the counter. Put a clean bowl on the towel. Only touch the outside of the bowl when handling it. Pour the cleansing or wetting solution that your health care provider tells you to use into the bowl. Select and cut your packing material to fit the size of your wound. Avoid using multiple pieces of  packing material. Drop it into the bowl. Cut tape strips that you will use to seal the outer dressing, if needed. Put gauze pads for cleansing and cotton-tipped swabs on the clean paper towel. Removing the old packing material and dressing Put on a set of gloves. Gently remove the old dressing and packing material. Make sure to check how the drainage looks or if there is any odor. Clean or rinse (irrigate) the wound. Remove your gloves. Put the removed items, including gloves, into the plastic bag to throw away later. Wash your hands again with soap and water for at least 20 seconds. If soap and water are not available, use hand sanitizer. Applying the new packing material and dressing  Put on a new set of gloves. Squeeze the packing material in the bowl to release the extra liquid. The packing material should be moist, but not dripping wet. Gently place the packing material into the wound. Use a cotton-tipped swab to guide it into place, filling all of the space. Do not overpack the wound bed. Dry your gloved fingertips on the paper towel. Open up your outer dressing supplies and put them on a dry part of the paper towel. Keep them from getting wet. Place the outer dressing over the packed wound. Tape the edges of the outer dressing in place. Remove your gloves. Wash your hands again with soap and water for at least 20 seconds. If soap and water are not available, use hand sanitizer. Put the removed items, including gloves, into the plastic bag to throw away. Clean and disinfect your work  surface or countertop. General tips Follow your health care provider's instructions on how much to pack the wound. At first, you may need to pack it more fully to help stop bleeding. As the wound begins to heal inside, you will use less packing material and pack the wound loosely to allow the tissue to heal slowly from the inside out. Do not take baths, swim, or use a hot tub until your health care  provider approves. Ask your health care provider if you may take showers. You may only be allowed to take sponge baths. Keep the dressing clean and dry. Follow any other instructions given by your health care provider on how to aid healing. This may include applying warm or cold compresses, raising (elevating) the affected area, or wearing a compression dressing. Check your wound site every day for signs of infection. Check for: More redness, swelling, or pain. More fluid or blood. Warmth or hardness (induration). Pus or a bad smell. Protect your wound from the sun when you are outside for the first 6 months, or for as long as told by your health care provider. Cover up the scar area or apply sunscreen that has an SPF of at least 52. Keep all follow-up visits. This is important. Contact a health care provider if: Your pain is not controlled with pain medicine. You have more drainage, redness, swelling, or pain at your wound site. You have new rash, warmth, or induration around the wound. You have a fever or chills. Your wound becomes larger or deeper. Get help right away if: The tissue inside your wound changes color from pink to white, yellow, or black. You notice a bad smell or pus coming from the wound site. You are having trouble packing your wound. Your wound is bleeding, and the bleeding does not stop with gentle pressure. These symptoms may represent a serious problem that is an emergency. Do not wait to see if the symptoms will go away. Get medical help right away. Call your local emergency services (911 in the [place] Do not drive yourself to the hospital. Summary Wound packing usually involves placing a moistened packing material into your wound and then covering it with an outer bandage (dressing). Follow your health care provider's instructions on how often you need to change dressings and pack your wound. You will likely be asked to change dressings 1 to 2 times a day. When  packing your wound, it is important to use gloves to avoid spreading germs into the wound. Check your wound site every day for signs of infection. This information is not intended to replace advice given to you by your health care provider. Make sure you discuss any questions you have with your health care provider.   Keep log of BP at home and hold BP meds if SBP<120

## 2022-03-07 DIAGNOSIS — L03314 Cellulitis of groin: Secondary | ICD-10-CM | POA: Diagnosis not present

## 2022-03-07 MED ORDER — DOXYCYCLINE HYCLATE 100 MG PO TABS: 100.0000 mg | ORAL_TABLET | Freq: Two times a day (BID) | ORAL | 0 refills | Status: AC

## 2022-03-07 MED ORDER — CHLORHEXIDINE GLUCONATE CLOTH 2 % EX PADS
6.0000 | MEDICATED_PAD | Freq: Every day | CUTANEOUS | Status: DC
  Administered 2022-03-07: 6 via TOPICAL

## 2022-03-07 NOTE — Discharge Summary (Signed)
Physician Discharge Summary   Patient: [name] Browning MRN: [id number] DOB: 1940/01/22  Admit date:     03/04/2022  Discharge date: 03/07/22  Discharge Physician: Fritzi Mandes   PCP: [place]   Recommendations at discharge:   follow-up surgery Dr. Adora Fridge in one week you dressing changes as per instruction keep log of blood pressure at home. Hold blood pressure meds if systolic blood pressure less than 120. Follow-up PCP in 1 to 2 weeks  Discharge Diagnoses: Principal Problem:   Cellulitis of groin Active Problems:   Dyslipidemia   Coronary artery disease   Chronic obstructive pulmonary disease (COPD) (HCC)   Chronic diastolic CHF (congestive heart failure) ([place])   Abscess of left groin [name] Browning is a 82 y.o. Caucasian male with medical history significant for coronary artery disease, diastolic CHF, CVA, COPD on 2L O2, hypertension, who presented to the ER with acute onset of Left groin swelling with associated redness, warmth and tenderness with pain that started on 'Sunday (5 days ago) when he scratched his left groin.    Cellulitis of groin with complex left inguinal abscess  --started on IV cefepime and vancomycin. --bedside I/D by Dr. Pabon today, Wound irrigated and 1/2 inch iodoform packing placed.   --change to po doxycycline --f/u abscess cx-- staph aureus --cont packing and dressing change --ok to d/c per dr pabon--dressing instructions per surgery   Chronic diastolic CHF (congestive heart failure) (HCC) - stable --resume cardiac meds at d/c   HTN --BP has been low today while on home Toprol, losartan  --pt grand-dter pt is not taking Imdur --resumed bp meds with holding parameter   Chronic obstructive pulmonary disease (COPD) (HCC) On 2L O2 at baseline --stable   Coronary artery disease - Home plavix on hold for surgical I/D--will resume from 2/26 --cont ASA and statin   Dyslipidemia - continue statin therapy.   Overall improving. D/c home spoke  with dter [name] Browning on the phone     DVT prophylaxis: Lovenox SQ Code Status: Full code  Family Communication: [name] Browning-dter Level of care: Med-Surg Dispo:   The patient is from: home Anticipated d/c is to: home today       Consultants: gen surgery Procedures performed: I and D left inguinal abscess  Disposition: Home Diet recommendation:  Discharge Diet Orders (From admission, onward)     Start     Ordered   03/06/22 0000  Diet - low sodium heart healthy        02'$ /25/24 1011           Cardiac diet DISCHARGE MEDICATION: Allergies as of 03/07/2022       Reactions   Hydromorphone    Hallucination Pt reports he doesn't know about this        Medication List     STOP taking these medications    amoxicillin-clavulanate 875-125 MG tablet Commonly known as: AUGMENTIN   digoxin 0.125 MG tablet Commonly known as: LANOXIN   ondansetron 4 MG tablet Commonly known as: Zofran   phenazopyridine 200 MG tablet Commonly known as: PYRIDIUM       TAKE these medications    acetaminophen 500 MG tablet Commonly known as: TYLENOL Take 2 tablets (1,000 mg total) by mouth every 6 (six) hours as needed for mild pain.   albuterol 108 (90 Base) MCG/ACT inhaler Commonly known as: VENTOLIN HFA Inhale into the lungs every 6 (six) hours as needed for wheezing or shortness of breath.   aspirin EC 81 MG tablet  Take 81 mg by mouth daily after supper.   atorvastatin 40 MG tablet Commonly known as: LIPITOR Take 40 mg by mouth daily after supper.   Breztri Aerosphere 160-9-4.8 MCG/ACT Aero Generic drug: Budeson-Glycopyrrol-Formoterol Inhale 2 puffs into the lungs in the morning and at bedtime.   cetirizine 10 MG tablet Commonly known as: ZYRTEC Take 10 mg by mouth daily.   clopidogrel 75 MG tablet Commonly known as: PLAVIX Take 1 tablet (75 mg total) by mouth daily.   doxycycline 100 MG tablet Commonly known as: VIBRA-TABS Take 1 tablet (100 mg total) by mouth every 12  (twelve) hours for 7 days.   famotidine 20 MG tablet Commonly known as: PEPCID TAKE 1 TABLET BY MOUTH AFTER SUPPER   ferrous gluconate 240 (27 FE) MG tablet Commonly known as: FERGON Take 240 mg by mouth 3 (three) times daily with meals.   fluticasone 50 MCG/ACT nasal spray Commonly known as: FLONASE Place 1 spray into both nostrils daily.   Klor-Con M20 20 MEQ tablet Generic drug: potassium chloride SA TAKE 1 TABLET BY MOUTH EVERY DAY   losartan 50 MG tablet Commonly known as: COZAAR Take 50 mg by mouth daily. Notes to patient: Hold if SBP is <120   magnesium oxide 400 MG tablet Commonly known as: MAG-OX Take 400 mg by mouth daily.   methenamine 1 g tablet Commonly known as: Hiprex Take 1 tablet (1 g total) by mouth 2 (two) times daily with a meal.   metoprolol succinate 50 MG 24 hr tablet Commonly known as: TOPROL-XL Take 25 mg by mouth daily. Notes to patient: Hold if SBP <120   montelukast 10 MG tablet Commonly known as: SINGULAIR Take 10 mg by mouth at bedtime.   nitroGLYCERIN 0.4 MG SL tablet Commonly known as: NITROSTAT Place under the tongue.   ondansetron 4 MG disintegrating tablet Commonly known as: ZOFRAN-ODT Take 1 tablet (4 mg total) by mouth every 6 (six) hours as needed for nausea or vomiting.   pantoprazole 40 MG tablet Commonly known as: PROTONIX TAKE 1 TABLET BY MOUTH TWICE A DAY   tamsulosin 0.4 MG Caps capsule Commonly known as: FLOMAX Take 1 capsule (0.4 mg total) by mouth daily.   torsemide 20 MG tablet Commonly known as: DEMADEX Take 3 tablets (60 mg total) by mouth daily. Notes to patient: Start taking from 03/11/22               Discharge Care Instructions  (From admission, onward)           Start     Ordered   03/07/22 0000  Discharge wound care:       Comments: PEr Dr Pabon--03/07/22 1000    Wound care  Daily      Comments: Please pack wound w 1/2 inch iodoform packing and cover w gauze. Please teach family about  this.  03/06/22 2256   03/07/22 1050   03/06/22 0000  Discharge wound care:       Comments: Pack wound daily   03/06/22 1011            Follow-up Information     Tylene Fantasia, PA-C. Go on 03/15/2022.   Specialty: Physician Assistant Why: Go at 2:00pm. Contact information: [address] [place] [address] [place] Schedule an appointment as soon as possible for a visit in 1 week(s).   Why: BP check and hopsital f/u Contact information: [address] [place] [address] [phone]                 Filed Weights   03/04/22 1836 03/05/22 0218  Weight: 74.8 kg 74 kg     Condition at discharge: fair  The results of significant diagnostics from this hospitalization (including imaging, microbiology, ancillary and laboratory) are listed below for reference.   Imaging Studies: CT ABDOMEN PELVIS W CONTRAST  Result Date: 03/04/2022 CLINICAL DATA:  Patient with recent history of left groin abscess, presenting with left groin pain. EXAM: CT ABDOMEN AND PELVIS WITH CONTRAST TECHNIQUE: Multidetector CT imaging of the abdomen and pelvis was performed using the standard protocol following bolus administration of intravenous contrast. RADIATION DOSE REDUCTION: This exam was performed according to the departmental dose-optimization program which includes automated exposure control, adjustment of the mA and/or kV according to patient size and/or use of iterative reconstruction technique. CONTRAST:  138m OMNIPAQUE IOHEXOL 300 MG/ML  SOLN COMPARISON:  August 07, 2021 FINDINGS: Lower chest: Emphysematous lung disease is seen within the bilateral lung bases. Hepatobiliary: No focal liver abnormality is seen. No gallstones, gallbladder wall thickening, or biliary dilatation. Pancreas: Unremarkable. No pancreatic ductal dilatation or surrounding inflammatory changes. Spleen: A punctate calcified granuloma is seen within the parenchyma of an  otherwise normal-appearing spleen. Adrenals/Urinary Tract: Adrenal glands are unremarkable. Kidneys are normal in size, without obstructing renal calculi, focal lesion, or hydronephrosis. A 1 mm nonobstructing renal calculus is seen within the upper pole of the right kidney. A 10 mm nonobstructing renal calculus is seen within the lower pole of the left kidney. Additional 2 mm and 3 mm nonobstructing left renal calculi are also noted. There is mild diffuse urinary bladder wall thickening. Stomach/Bowel: Stomach is within normal limits. Appendix appears normal. No evidence of bowel wall thickening, distention, or inflammatory changes. Noninflamed diverticula are seen throughout the descending and sigmoid colon. Vascular/Lymphatic: Aortic atherosclerosis. No enlarged abdominal or pelvic lymph nodes. Reproductive: There is moderate to marked severity prostate gland enlargement with moderate severity prostate gland calcification. Other: Moderate severity subcutaneous inflammatory fat stranding is seen within the region of the left groin. Associated cutaneous thickening is noted. There is no evidence of associated fluid collection or abscess. Stable bilateral fat containing inguinal hernias are noted. No abdominopelvic ascites. Musculoskeletal: A chronic compression fracture deformity is seen involving the L4 vertebral body. Multilevel degenerative changes seen throughout the lumbar spine. IMPRESSION: 1. Moderate severity left groin cellulitis without evidence of an associated fluid collection or abscess. 2. Bilateral nonobstructing renal calculi. 3. Colonic diverticulosis. 4. Moderate to marked severity prostate gland enlargement. 5. Chronic compression fracture deformity of the L4 vertebral body. 6. Aortic atherosclerosis. Aortic Atherosclerosis (ICD10-I70.0). Electronically Signed   By: TVirgina NorfolkM.D.   On: 03/04/2022 22:57    Microbiology: Results for orders placed or performed during the hospital  encounter of 03/04/22  Aerobic/Anaerobic Culture w Gram Stain (surgical/deep wound)     Status: None (Preliminary result)   Collection Time: 03/05/22 11:38 AM   Specimen: Abscess  Result Value Ref Range Status   Specimen Description   Final    ABSCESS Performed at [facility] [address], [place] [address]   Special Requests   Final    NONE Performed at [facility] [address] [place] [id number]   Gram Stain   Final    RARE WBC PRESENT, PREDOMINANTLY PMN FEW GRAM POSITIVE COCCI IN PAIRS    Culture   Final  MODERATE STAPHYLOCOCCUS AUREUS SUSCEPTIBILITIES TO FOLLOW Performed at [facility], [place] [address] [place], [place] [address]    Report Status PENDING  Incomplete    Labs: CBC: Recent Labs  Lab 03/04/22 1837 03/05/22 0417 03/06/22 0359  WBC 11.6* 9.3 7.0  HGB 12.0* 10.7* 9.2*  HCT 37.8* 33.4* 29.0*  MCV 89.8 90.0 89.8  PLT 243 200 [id number]   Basic Metabolic Panel: Recent Labs  Lab 03/04/22 1837 03/05/22 0417 03/06/22 0359  NA 137 138 132*  K 3.4* 3.4* 4.2  CL 94* 99 101  CO2 '27 28 24  '$ GLUCOSE 137* 107* 116*  BUN 26* 30* 39*  CREATININE 1.24 1.21 1.57*  CALCIUM 9.2 8.5* 8.5*  MG  --   --  2.1   Liver Function Tests: Recent Labs  Lab 03/04/22 1837  AST 25  ALT 14  ALKPHOS 149*  BILITOT 1.2  PROT 7.6  ALBUMIN 3.9    Discharge time spent: greater than 30 minutes.  Signed: Fritzi Mandes, MD [facility] 03/07/2022

## 2022-03-07 NOTE — TOC CM/SW Note (Signed)
  Transition of Care [facility]) Screening Note   Patient Details  Name: [name] Browning Date of Birth: February 22, 1940   Transition of Care [facility] CM/SW Contact:    Candie Chroman, LCSW Phone Number: 03/07/2022, 9:21 AM    Transition of Care Department [facility]) has reviewed patient and no TOC needs have been identified at this time. We will continue to monitor patient advancement through interdisciplinary progression rounds. If new patient transition needs arise, please place a TOC consult.

## 2022-03-08 DIAGNOSIS — Z6821 Body mass index (BMI) 21.0-21.9, adult: Secondary | ICD-10-CM | POA: Diagnosis not present

## 2022-03-08 DIAGNOSIS — L0291 Cutaneous abscess, unspecified: Secondary | ICD-10-CM | POA: Diagnosis not present

## 2022-03-10 DIAGNOSIS — L03818 Cellulitis of other sites: Secondary | ICD-10-CM | POA: Diagnosis not present

## 2022-03-10 LAB — AEROBIC/ANAEROBIC CULTURE W GRAM STAIN (SURGICAL/DEEP WOUND)

## 2022-03-11 ENCOUNTER — Encounter: Payer: Self-pay | Admitting: Cardiovascular Disease

## 2022-03-11 ENCOUNTER — Ambulatory Visit (INDEPENDENT_AMBULATORY_CARE_PROVIDER_SITE_OTHER): Payer: Medicare HMO | Admitting: Cardiovascular Disease

## 2022-03-11 VITALS — BP 130/80 | HR 86 | Ht 74.0 in | Wt 164.4 lb

## 2022-03-11 DIAGNOSIS — I5030 Unspecified diastolic (congestive) heart failure: Secondary | ICD-10-CM

## 2022-03-11 DIAGNOSIS — R0789 Other chest pain: Secondary | ICD-10-CM

## 2022-03-11 DIAGNOSIS — Z6821 Body mass index (BMI) 21.0-21.9, adult: Secondary | ICD-10-CM | POA: Diagnosis not present

## 2022-03-11 DIAGNOSIS — I10 Essential (primary) hypertension: Secondary | ICD-10-CM | POA: Diagnosis not present

## 2022-03-11 DIAGNOSIS — I2510 Atherosclerotic heart disease of native coronary artery without angina pectoris: Secondary | ICD-10-CM

## 2022-03-11 DIAGNOSIS — L0291 Cutaneous abscess, unspecified: Secondary | ICD-10-CM | POA: Diagnosis not present

## 2022-03-11 DIAGNOSIS — I480 Paroxysmal atrial fibrillation: Secondary | ICD-10-CM

## 2022-03-11 MED ORDER — RANOLAZINE ER 500 MG PO TB12: 500.0000 mg | ORAL_TABLET | Freq: Two times a day (BID) | ORAL | 6 refills | Status: DC

## 2022-03-11 MED ORDER — ISOSORBIDE MONONITRATE ER 30 MG PO TB24: 30.0000 mg | ORAL_TABLET | Freq: Every day | ORAL | 1 refills | Status: DC

## 2022-03-11 NOTE — Assessment & Plan Note (Signed)
Patient reports occasional chest pain. Isosorbide stopped during recent hospital admission due to hypotension. Will restart today, add ranolazine.

## 2022-03-11 NOTE — Progress Notes (Signed)
Cardiology Office Note   Date:  03/11/2022   ID:  [name] Browning, DOB 09-Nov-1940, MRN [id number]  PCP:  [place]  Cardiologist:  Neoma Laming, MD    History of Present Illness: [name] Browning is a 82 y.o. male who presents for  Chief Complaint  Patient presents with   Follow-up    1 month fu    Patient in office for 1 month follow up. Reports occasional chest pain, shortness of breath unchanged.     Past Medical History:  Diagnosis Date   Anginal pain ([place])    Anxiety    Aortic atherosclerosis ([place])    Atrial fibrillation ([place]) 01/2019   Bilateral carpal tunnel syndrome    CAD (coronary artery disease)    a.) LHC 2004 --> normal coronaries. b.) normal stress test in 2007 and 2011; c.) Lexiscan 05/20/2014 --> LVEF 55-65%; no significant stress induced ischemia/arrythmia. d.) CT chest 03/25/2019 --> coronaries carcified.   Carotid atherosclerosis, bilateral    Carpal tunnel syndrome, bilateral    CHF (congestive heart failure) (HCC)    Chronic anticoagulation    a.) ASA + apixaban   COPD (chronic obstructive pulmonary disease) (HCC)    CVA (cerebral vascular accident) ([place])    Degenerative disc disease, cervical    Diastolic dysfunction    a.) TTE 05/29/2014 --> LVEF 60-65%; G1DD.   DVT (deep venous thrombosis) (HCC)    GERD (gastroesophageal reflux disease)    History of 2019 novel coronavirus disease (COVID-19) 02/08/2019   HLD (hyperlipidemia)    Hypertension    Kidney stones    Osteoarthritis of right shoulder    Pneumonia    Respiratory failure, acute ([place]) 09/20/2020   a.) severe respiratory distress 1 hour after urological surgery. CXR (+) for acute pulmonary edema. Transferred to ICU and placed on NIPPV. Questionable aspiration PNA. (+) A.fib with RVR. Improved with ABX, diuresis, and amiodarone.   Skin cancer of face    a.) RIGHT ear and RIGHT forehead; excised.   Syncope    TIA (transient ischemic attack) 2016   Valvular regurgitation    a.)  TTE 05/26/2014 --> LVEF 60-65%; trivial MR, mild TR; no AR or PR. b.) TTE 04/20/2015 --> LVEF 55-60%; trivial MR and PR; no AR or TR.     Past Surgical History:  Procedure Laterality Date   CARDIAC CATHETERIZATION  2004   CARPAL TUNNEL RELEASE Right 06/11/2013   CENTRAL LINE INSERTION  11/14/2020   Procedure: CENTRAL LINE INSERTION;  Surgeon: Leonie Man, MD;  Location: [facility];  Service: Cardiovascular;;   CORONARY/GRAFT ACUTE MI REVASCULARIZATION N/A 11/14/2020   Procedure: Coronary/Graft Acute MI Revascularization;  Surgeon: Leonie Man, MD;  Location: [facility];  Service: Cardiovascular;  Laterality: N/A;   CYSTOSCOPY W/ RETROGRADES  10/16/2020   Procedure: CYSTOSCOPY WITH RETROGRADE PYELOGRAM;  Surgeon: Billey Co, MD;  Location: [facility];  Service: Urology;;   CYSTOSCOPY W/ URETERAL STENT PLACEMENT Left 09/20/2020   Procedure: CYSTOSCOPY WITH RETROGRADE PYELOGRAM/URETERAL STENT PLACEMENT;  Surgeon: Janith Lima, MD;  Location: [facility];  Service: Urology;  Laterality: Left;   CYSTOSCOPY/URETEROSCOPY/HOLMIUM LASER/STENT PLACEMENT Left 10/16/2020   Procedure: CYSTOSCOPY/URETEROSCOPY/HOLMIUM LASER/STENT PLACEMENT;  Surgeon: Billey Co, MD;  Location: [facility];  Service: Urology;  Laterality: Left;   ESOPHAGOGASTRODUODENOSCOPY N/A 11/06/2020   Procedure: ESOPHAGOGASTRODUODENOSCOPY (EGD);  Surgeon: Lucilla Lame, MD;  [facility];  Service: Endoscopy;  Laterality: N/A;   LEFT HEART CATH AND CORONARY  ANGIOGRAPHY N/A 11/14/2020   Procedure: LEFT HEART CATH AND CORONARY ANGIOGRAPHY;  Surgeon: Leonie Man, MD;  Location: [facility];  Service: Cardiovascular;  Laterality: N/A;   LEFT HEART CATH AND CORONARY ANGIOGRAPHY Left 01/28/2022   Procedure: LEFT HEART CATH AND CORONARY ANGIOGRAPHY;  Surgeon: Dionisio David, MD;  Location: [facility];  Service: Cardiovascular;  Laterality: Left;   SHOULDER ARTHROSCOPY WITH OPEN  ROTATOR CUFF REPAIR Right 08/24/2017   Procedure: SHOULDER ARTHROSCOPY WITH OPEN ROTATOR CUFF REPAIR;  Surgeon: Corky Mull, MD;  Location: [facility];  Service: Orthopedics;  Laterality: Right;   SKIN CANCER EXCISION  12/01/2016   right ear    SKIN CANCER EXCISION     remove from the right side of the face    THROMBECTOMY Right 2004   leg   THYROIDECTOMY  1950   Not sure if total or partial thyroidectomy.    Current Outpatient Medications  Medication Sig Dispense Refill   acetaminophen (TYLENOL) 500 MG tablet Take 2 tablets (1,000 mg total) by mouth every 6 (six) hours as needed for mild pain.     albuterol (VENTOLIN HFA) 108 (90 Base) MCG/ACT inhaler Inhale into the lungs every 6 (six) hours as needed for wheezing or shortness of breath.     aspirin EC 81 MG tablet Take 81 mg by mouth daily after supper.     atorvastatin (LIPITOR) 40 MG tablet Take 40 mg by mouth daily after supper.     Budeson-Glycopyrrol-Formoterol (BREZTRI AEROSPHERE) 160-9-4.8 MCG/ACT AERO Inhale 2 puffs into the lungs in the morning and at bedtime. 5.9 g 0   cetirizine (ZYRTEC) 10 MG tablet Take 10 mg by mouth daily.     clopidogrel (PLAVIX) 75 MG tablet Take 1 tablet (75 mg total) by mouth daily. 90 tablet 3   doxycycline (VIBRA-TABS) 100 MG tablet Take 1 tablet (100 mg total) by mouth every 12 (twelve) hours for 7 days. 14 tablet 0   famotidine (PEPCID) 20 MG tablet TAKE 1 TABLET BY MOUTH AFTER SUPPER 90 tablet 3   ferrous gluconate (FERGON) 240 (27 FE) MG tablet Take 240 mg by mouth 3 (three) times daily with meals.     fluticasone (FLONASE) 50 MCG/ACT nasal spray Place 1 spray into both nostrils daily.     isosorbide mononitrate (IMDUR) 30 MG 24 hr tablet Take 1 tablet (30 mg total) by mouth daily. 30 tablet 1   KLOR-CON M20 20 MEQ tablet TAKE 1 TABLET BY MOUTH EVERY DAY 90 tablet 3   losartan (COZAAR) 50 MG tablet Take 50 mg by mouth daily.     magnesium oxide (MAG-OX) 400 MG tablet Take 400 mg by mouth daily.      methenamine (HIPREX) 1 g tablet Take 1 tablet (1 g total) by mouth 2 (two) times daily with a meal. 60 tablet 3   metoprolol succinate (TOPROL-XL) 50 MG 24 hr tablet Take 25 mg by mouth daily.     montelukast (SINGULAIR) 10 MG tablet Take 10 mg by mouth at bedtime.     nitroGLYCERIN (NITROSTAT) 0.4 MG SL tablet Place under the tongue.     ondansetron (ZOFRAN-ODT) 4 MG disintegrating tablet Take 1 tablet (4 mg total) by mouth every 6 (six) hours as needed for nausea or vomiting. 20 tablet 0   ranolazine (RANEXA) 500 MG 12 hr tablet Take 1 tablet (500 mg total) by mouth 2 (two) times daily. 30 tablet 6   tamsulosin (FLOMAX) 0.4 MG CAPS capsule  Take 1 capsule (0.4 mg total) by mouth daily. 30 capsule 11   torsemide (DEMADEX) 20 MG tablet Take 3 tablets (60 mg total) by mouth daily. 270 tablet 3   pantoprazole (PROTONIX) 40 MG tablet TAKE 1 TABLET BY MOUTH TWICE A DAY 180 tablet 1   No current facility-administered medications for this visit.   Facility-Administered Medications Ordered in Other Visits  Medication Dose Route Frequency Provider Last Rate Last Admin   sodium chloride flush (NS) 0.9 % injection 3 mL  3 mL Intravenous Q12H Scoggins, Amber, NP        Allergies:   Hydromorphone    Social History:   reports that he quit smoking about 19 years ago. His smoking use included cigarettes. He started smoking about 65 years ago. He has a 46.00 pack-year smoking history. He has been exposed to tobacco smoke. He quit smokeless tobacco use about 19 years ago.  His smokeless tobacco use included snuff. He reports that he does not currently use alcohol after a past usage of about 7.0 standard drinks of alcohol per week. He reports that he does not use drugs.   Family History:  family history includes CAD in his father; Heart attack in his father; Heart disease in his mother; Hypertension in his mother.    ROS:     Review of Systems  Constitutional: Negative.   HENT: Negative.    Eyes:  Negative.   Respiratory: Negative.    Gastrointestinal: Negative.   Genitourinary: Negative.   Musculoskeletal: Negative.   Skin: Negative.   Neurological: Negative.   Endo/Heme/Allergies: Negative.   Psychiatric/Behavioral: Negative.    All other systems reviewed and are negative.   All other systems are reviewed and negative.   PHYSICAL EXAM: VS:  BP 130/80   Pulse 86   Ht '6\' 2"'$  (1.88 m)   Wt 164 lb 6.4 oz (74.6 kg)   SpO2 92%   BMI 21.11 kg/m  , BMI Body mass index is 21.11 kg/m. Last weight:  Wt Readings from Last 3 Encounters:  03/11/22 164 lb 6.4 oz (74.6 kg)  03/05/22 163 lb 2.3 oz (74 kg)  02/02/22 169 lb (76.7 kg)    Physical Exam Vitals reviewed.  Constitutional:      Appearance: Normal appearance. He is normal weight.  HENT:     Head: Normocephalic.     Nose: Nose normal.     Mouth/Throat:     Mouth: Mucous membranes are moist.  Eyes:     Pupils: Pupils are equal, round, and reactive to light.  Cardiovascular:     Rate and Rhythm: Normal rate and regular rhythm.     Pulses: Normal pulses.     Heart sounds: Normal heart sounds.  Pulmonary:     Effort: Pulmonary effort is normal.  Abdominal:     General: Abdomen is flat. Bowel sounds are normal.  Musculoskeletal:        General: Normal range of motion.     Cervical back: Normal range of motion.  Skin:    General: Skin is warm.  Neurological:     General: No focal deficit present.     Mental Status: He is alert.  Psychiatric:        Mood and Affect: Mood normal.     EKG: none today  Recent Labs: 03/04/2022: ALT 14 03/06/2022: BUN 39; Creatinine, Ser 1.57; Hemoglobin 9.2; Magnesium 2.1; Platelets 182; Potassium 4.2; Sodium 132    Lipid Panel    Component Value Date/Time  CHOL 141 11/12/2020 0435   CHOL 175 06/01/2015 1049   TRIG 47 11/12/2020 0435   HDL 38 (L) 11/12/2020 0435   HDL 70 06/01/2015 1049   CHOLHDL 3.7 11/12/2020 0435   VLDL 9 11/12/2020 0435   LDLCALC 94 11/12/2020 0435    LDLCALC 105 (H) 12/07/2016 0958      Other studies Reviewed: Patient: [name] Browning DOB:  05/16/1940  Date:  12/29/2021 09:30 Provider: Neoma Laming MD Encounter: ECHO   Page 2 REASON FOR VISIT  Visit for: Echocardiogram/I50.9  Sex:   Male         wt=171    lbs.  BP=  Height=74    inches.   TESTS  Imaging: Echocardiogram:  An echocardiogram in (2-d) mode was performed and in Doppler mode with color flow velocity mapping was performed. The aortic valve cusps are abnormal 1.5   cm, flow velocity 1.4   m/s, and systolic calculated mean flow gradient 4  mmHg. Mitral valve diastolic peak flow velocity E 1.0   m/s and E/A ratio 4.2. Aortic root diameter 2.9  cm. The LVOT internal diameter 2.0 cm and flow velocity was abnormal .88  m/s. LV systolic dimension 2.9   cm, diastolic 4.1 cm, posterior wall thickness 1.0  cm, fractional shortening 25 %, and EF 55-60 %. IVS thickness 1.0   cm. LA dimension 3.8 cm  RIGHT atrium= 22.3   cm2. Mitral Valve has Trace Regurgitation. Tricuspid Valve has Trace to Mild Regurgitation.     ASSESSMENT  Technically adequate study.  Normal left ventricular systolic function.  Mild left ventricular hypertrophy with GRADE 3 (restrictive physiology) diastolic dysfunction.  Normal right ventricular systolic function.  Normal right ventricular diastolic function.  Normal left ventricular wall motion.  Normal right ventricular wall motion.  Trace to Mild tricuspid regurgitation.  Mild pulmonary hypertension.  Trace mitral regurgitation.  No pericardial effusion.  Mildly dilated Left atrium  Mildly dilated Right atrium  Mildly dilated Right ventricle.     THERAPY   Referring physician: Dionisio David  Sonographer: STU.  Neoma Laming MD  Electronically signed by: Neoma Laming     Date: 12/30/2021 14:11  Patient: [name] Browning DOB:  1940-11-15  Date:  12/20/2021 08:00 Provider: Neoma Laming MD Encounter: NUCLEAR  STRESS TEST   Page 1 TESTS    [facility] [address] [place], [place] [address] [phone] STUDY:  Gated Stress / Rest Myocardial Perfusion Imaging Tomographic (SPECT) Including attenuation correction Wall Motion, Left Ventricular Ejection Fraction By Gated Technique.Persantine Stress Test. SEX:  Male  WEIGHT:  176 lbs   HEIGHT:  74 in    ARMS UP: YES/NO  REFERRING PHYSICIAN: Dr.Sora Vrooman Humphrey Rolls                                                                                                                                                                                                                       INDICATION FOR STUDY: CP                                                                                                                                                                                                                    TECHNIQUE:  Approximately 20 minutes following the intravenous administration of 9.5 mCi of Tc-47mSestamibi after stress testing in a reclined supine position with arms above their head if able to do so, gated SPECT imaging of the heart was performed. After about a 2hr break, the patient was injected intravenously with 33.6 mCi of Tc-932mestamibi.  Approximately 45 minutes later in the same position as stress imaging SPECT rest imaging of the heart was performed.  STRESS BY:  ShNeoma LamingMD PROTOCOL:  Persantine  DOSE ADMIN: 9.1 CC     ROUTE OF ADMINISTRATION: IV  MAX PRED HR: 140                     85%: 119               75%:  105                                                                                                                  RESTING BP: 124/80  RESTING HR: 98  PEAK BP: 118/62   PEAK HR:  98                                                                   EXERCISE DURATION:    4 min injection                                            REASON FOR TEST TERMINATION:    Protocol end                                                                                                                              SYMPTOMS:   None  EKG RESULTS:  A-Fib, 68/min. No significant ST changes with persantine.                                                             IMAGE QUALITY:  Good                                                                                                                                                                                                                                                                                                                                 PERFUSION/WALL MOTION FINDINGS:  EF = 81%. Small mild reversible basal, mid and apical inferior, and apex (17) wall defects, normal wall motion.                                                                          IMPRESSION:  Ischemia in the RCA territory with normal LVEF, underlying A-Fib, consider CCTA.  Neoma Laming, MD Stress Interpreting Physician / Nuclear Interpreting Physician        Neoma Laming MD  Electronically signed by: Neoma Laming     Date: 12/23/2021 11:25  Patient: [name] Browning DOB:  1940/12/20  Date:  05/06/2021 10:00 Provider: Neoma Laming MD Encounter: ALL ANGIOGRAMS (CTA BRAIN,  CAROTIDS, RENAL ARTERIES, PE)   Page 1 REASON FOR VISIT  Referred by Dr.Jennesis Ramaswamy Humphrey Rolls.   TESTS  Imaging: Computed Tomographic Angiography:  Cardiac multidetector CT was performed paying particular attention to the coronary arteries for the diagnosis of: A-Fib. Diagnostic Drugs:  Administered iohexol (Omnipaque) through an antecubital vein and images from the examination were analyzed for the presence and extent of coronary artery disease, using 3D image processing software. 100 mL of non-ionic contrast (Omnipaque) was used.    TEST CONCLUSIONS  Quality of study: Fair.  1-Calcium score: 3771.1  2-Right dominant system.  3-Stent in proximal RCA is patent. Stent in proximal LAD is patent, but distal LAD not visulaized well. LCX is a very small vessel. If continues to have chest pain consider cardiac Cath.   Neoma Laming MD  Electronically signed by: Neoma Laming     Date: 05/06/2021 14:52   ASSESSMENT AND PLAN:    ICD-10-CM   1. Other chest pain  R07.89     2. Heart failure with preserved ejection fraction, unspecified HF chronicity (HCC)  I50.30     3. AF (paroxysmal atrial fibrillation) (HCC)  I48.0     4. Coronary artery disease involving native coronary artery of native heart without angina pectoris  I25.10 isosorbide mononitrate (IMDUR) 30 MG 24 hr tablet    ranolazine (RANEXA) 500 MG 12 hr tablet    5. Primary hypertension  I10        Problem List Items Addressed This Visit       Cardiovascular and Mediastinum   HTN (hypertension)   Relevant Medications   isosorbide mononitrate (IMDUR) 30 MG 24 hr tablet   ranolazine (RANEXA) 500 MG 12 hr tablet   AF (paroxysmal atrial fibrillation) (HCC)   Relevant Medications   isosorbide mononitrate (IMDUR) 30 MG 24 hr tablet   ranolazine (RANEXA) 500 MG 12 hr tablet   Coronary artery disease   Relevant Medications   isosorbide mononitrate (IMDUR) 30 MG 24 hr tablet   ranolazine (RANEXA) 500 MG 12 hr tablet   (HFpEF) heart  failure with preserved ejection fraction (HCC)   Relevant Medications   isosorbide mononitrate (IMDUR) 30 MG 24 hr tablet   ranolazine (RANEXA) 500 MG 12 hr tablet     Other   Chest pain - Primary    Patient reports occasional chest pain. Isosorbide stopped during recent hospital admission due to hypotension. Will restart today, add ranolazine.         Disposition:   Return in about 4 weeks (around 04/08/2022).    Total time spent: 30 minutes  Signed,  Neoma Laming, MD  03/11/2022 11:20 [place]

## 2022-03-14 ENCOUNTER — Emergency Department: Payer: Medicare HMO

## 2022-03-14 ENCOUNTER — Emergency Department
Admission: EM | Admit: 2022-03-14 | Discharge: 2022-03-14 | Disposition: A | Discharge status: Home / Self Care | Payer: Medicare HMO | Attending: Emergency Medicine | Admitting: Emergency Medicine

## 2022-03-14 DIAGNOSIS — I959 Hypotension, unspecified: Secondary | ICD-10-CM | POA: Diagnosis not present

## 2022-03-14 DIAGNOSIS — I509 Heart failure, unspecified: Secondary | ICD-10-CM | POA: Insufficient documentation

## 2022-03-14 DIAGNOSIS — R079 Chest pain, unspecified: Secondary | ICD-10-CM

## 2022-03-14 DIAGNOSIS — Z7901 Long term (current) use of anticoagulants: Secondary | ICD-10-CM | POA: Diagnosis not present

## 2022-03-14 DIAGNOSIS — I4891 Unspecified atrial fibrillation: Secondary | ICD-10-CM | POA: Insufficient documentation

## 2022-03-14 DIAGNOSIS — Z9981 Dependence on supplemental oxygen: Secondary | ICD-10-CM | POA: Diagnosis not present

## 2022-03-14 DIAGNOSIS — I2510 Atherosclerotic heart disease of native coronary artery without angina pectoris: Secondary | ICD-10-CM | POA: Diagnosis not present

## 2022-03-14 DIAGNOSIS — R0789 Other chest pain: Secondary | ICD-10-CM | POA: Diagnosis not present

## 2022-03-14 DIAGNOSIS — Z7982 Long term (current) use of aspirin: Secondary | ICD-10-CM | POA: Diagnosis not present

## 2022-03-14 DIAGNOSIS — J449 Chronic obstructive pulmonary disease, unspecified: Secondary | ICD-10-CM | POA: Insufficient documentation

## 2022-03-14 DIAGNOSIS — J439 Emphysema, unspecified: Secondary | ICD-10-CM | POA: Diagnosis not present

## 2022-03-14 DIAGNOSIS — I110 Hypertensive heart disease with heart failure: Secondary | ICD-10-CM | POA: Diagnosis not present

## 2022-03-14 LAB — CBC
HCT: 36.8 % — ABNORMAL LOW (ref 39.0–52.0)
Hemoglobin: 11.8 g/dL — ABNORMAL LOW (ref 13.0–17.0)
MCH: 28.6 pg (ref 26.0–34.0)
MCHC: 32.1 g/dL (ref 30.0–36.0)
MCV: 89.3 fL (ref 80.0–100.0)
Platelets: 287 10*3/uL (ref 150–400)
RBC: 4.12 MIL/uL — ABNORMAL LOW (ref 4.22–5.81)
RDW: 13.6 % (ref 11.5–15.5)
WBC: 8.2 10*3/uL (ref 4.0–10.5)
nRBC: 0 % (ref 0.0–0.2)

## 2022-03-14 LAB — COMPREHENSIVE METABOLIC PANEL
ALT: 24 U/L (ref 0–44)
AST: 29 U/L (ref 15–41)
Albumin: 3.8 g/dL (ref 3.5–5.0)
Alkaline Phosphatase: 132 U/L — ABNORMAL HIGH (ref 38–126)
Anion gap: 12 (ref 5–15)
BUN: 29 mg/dL — ABNORMAL HIGH (ref 8–23)
CO2: 27 mmol/L (ref 22–32)
Calcium: 9.5 mg/dL (ref 8.9–10.3)
Chloride: 100 mmol/L (ref 98–111)
Creatinine, Ser: 1.14 mg/dL (ref 0.61–1.24)
GFR, Estimated: >60 mL/min (ref >60)
Glucose, Bld: 104 mg/dL — ABNORMAL HIGH (ref 70–99)
Potassium: 4.1 mmol/L (ref 3.5–5.1)
Sodium: 139 mmol/L (ref 135–145)
Total Bilirubin: 0.7 mg/dL (ref 0.3–1.2)
Total Protein: 7.4 g/dL (ref 6.5–8.1)

## 2022-03-14 LAB — TROPONIN I (HIGH SENSITIVITY)
Troponin I (High Sensitivity): 30 ng/L — ABNORMAL HIGH (ref <18)
Troponin I (High Sensitivity): 31 ng/L — ABNORMAL HIGH (ref <18)

## 2022-03-14 NOTE — ED Provider Notes (Signed)
[facility] Provider Note    Event Date/Time   First MD Initiated Contact with Patient 03/14/22 1501     (approximate)  History   Chief Complaint: Chest Pain  HPI  [name] Browning is a 82 y.o. male with a past medical history of CAD status post 3 stents, CHF, COPD on 2 L chronically, hypertension, hyperlipidemia, presents to the emergency department for chest pain.  Patient recently discharged from the hospital 03/07/2022 per record review for left groin abscess being followed by surgery recently completed antibiotics (doxycycline).  Patient states a history of A-fib looks to be on Plavix and aspirin per record review.  Patient states he was taking out the trash this morning around 830 when he developed pain in the center of his chest along with weakness denies any nausea diaphoresis or shortness of breath.  Symptoms lasted approximately 2 hours before they resolved around 1030.  Patient states around 11 or 12 the pain came back and eventually called EMS.  EMS gave the patient 1 spray of nitroglycerin and 324 mg aspirin.  Patient states the pain is largely resolved now.  Patient follows up with Dr.Khan of cardiology.  Physical Exam   Triage Vital Signs: ED Triage Vitals [03/14/22 1500]  Enc Vitals Group     BP (!) 128/90     Pulse Rate 65     Resp 12     Temp (!) 97.5 F (36.4 C)     Temp Source Oral     SpO2 99 %     Weight      Height      Head Circumference      Peak Flow      Pain Score      Pain Loc      Pain Edu?      Excl. in [place]?     Most recent vital signs: Vitals:   03/14/22 1500  BP: (!) 128/90  Pulse: 65  Resp: 12  Temp: (!) 97.5 F (36.4 C)  SpO2: 99%    General: Awake, no distress.  CV:  Good peripheral perfusion.  Regular rate and rhythm  Resp:  Normal effort.  Equal breath sounds bilaterally.  Abd:  No distention.  Soft, nontender.  No rebound or guarding.   ED Results / Procedures / Treatments   EKG  EKG viewed and  interpreted by myself shows atrial fibrillation at 61 bpm with a narrow QRS, normal axis, normal intervals, no concerning ST changes.  RADIOLOGY  Chest x-ray viewed and interpreted by myself shows no consolidation. Radiology read the chest x-ray is chronic lung disease and emphysema without acute process.   MEDICATIONS ORDERED IN ED: Medications - No data to display   IMPRESSION / MDM / [place] / ED COURSE  I reviewed the triage vital signs and the nursing notes.  Patient's presentation is most consistent with acute presentation with potential threat to life or bodily function.  Patient presents emergency department for chest pain.  Patient has significant cardiac history including 3 prior stents follows up with Dr. Humphrey Rolls of cardiology.  Patient states the pain is largely resolved after receiving nitroglycerin.  We will check labs including cardiac enzymes we will obtain a chest x-ray and continue to closely monitor.  Vital signs reassuring, physical exam reassuring.  Satting 99% on 2 L which is his baseline.  Patient's workup is overall reassuring.  Chest x-ray is clear, EKG shows no concerning findings.  Patient CBC is  normal, chemistry is normal including renal function.  Patient's troponin mildly elevated at 30 however unchanged from historical values.  Repeat troponin largely unchanged.  Given the patient's reassuring workup reassuring physical exam and is now chest pain-free I believe the patient is safe for discharge home with cardiology follow-up.  Patient is agreeable to plan of care.  FINAL CLINICAL IMPRESSION(S) / ED DIAGNOSES   Chest pain   Note:  This document was prepared using Dragon voice recognition software and may include unintentional dictation errors.   Harvest Dark, MD 03/14/22 1800

## 2022-03-14 NOTE — ED Triage Notes (Signed)
Pt to ED via [facility] for c/o chest pain that started this morning around 8:30 when taking out the trash. Pain went away for about an hour and returned. States pain is left-sided and described as an ache that radiates to his neck. Hx of three stints, Afib. Given 1 sublingual nitro and 324 mg aspirin by EMS. Hx COPD 2L at baseline.

## 2022-03-15 ENCOUNTER — Ambulatory Visit (INDEPENDENT_AMBULATORY_CARE_PROVIDER_SITE_OTHER): Payer: Medicare HMO | Admitting: Physician Assistant

## 2022-03-15 ENCOUNTER — Other Ambulatory Visit: Payer: Self-pay

## 2022-03-15 ENCOUNTER — Encounter: Payer: Self-pay | Admitting: Physician Assistant

## 2022-03-15 VITALS — BP 133/47 | HR 87 | Temp 97.7°F | Ht 74.0 in | Wt 162.4 lb

## 2022-03-15 DIAGNOSIS — Z09 Encounter for follow-up examination after completed treatment for conditions other than malignant neoplasm: Secondary | ICD-10-CM

## 2022-03-15 DIAGNOSIS — J479 Bronchiectasis, uncomplicated: Secondary | ICD-10-CM | POA: Diagnosis not present

## 2022-03-15 DIAGNOSIS — L02214 Cutaneous abscess of groin: Secondary | ICD-10-CM

## 2022-03-15 NOTE — Progress Notes (Signed)
[facility] POST-OP OFFICE VISIT  03/15/2022  HPI: YONIS RIEHL is a 82 y.o. male 9 days s/p incision and drainage of left inguinal abscess with Dr Dahlia Byes  Cx with MRSA - Doxycycline; He has completed course Doing well No issues; No fever, chills Family helping with packing   Vital signs: BP (!) 133/47   Pulse 87   Temp 97.7 F (36.5 C) (Oral)   Ht '6\' 2"'$  (1.88 m)   Wt 162 lb 6.4 oz (73.7 kg)   SpO2 95%   BMI 20.85 kg/m    Physical Exam: Constitutional: Well appearing male, NAD Skin: Freda Munro present as chaperone; I&D to the left groin, packing removed. Wound bed appears healthy. The surrounding tissue is indurated; no erythema   Assessment/Plan: This is a 82 y.o. male 9 days s/p incision and drainage of left inguinal abscess with Dr Dahlia Byes   - No need for further Abx nor procedure  - Pain control prn  - Reviewed wound care recommendation; daily packing   - I will see him again in 3-4 weeks for wound check; He understands to call with questions/concerns  -- Edison Simon, PA-C [facility] 03/15/2022, 2:32 PM M-F: 7am - 4pm

## 2022-03-15 NOTE — Patient Instructions (Signed)
Continue to pack daily and use small amount of tape to secure the dressing. Please see your follow ap appointment listed below.

## 2022-03-18 ENCOUNTER — Telehealth: Payer: Self-pay

## 2022-03-18 NOTE — Telephone Encounter (Signed)
        Patient  visited [place] on 03/14/2022    Telephone encounter attempt :  2nd Invalid number    [place], [place] [phone] [address], [place], [place] [address] Phone: [phone] Email: [email or web address]

## 2022-03-23 DIAGNOSIS — J309 Allergic rhinitis, unspecified: Secondary | ICD-10-CM | POA: Diagnosis not present

## 2022-03-23 DIAGNOSIS — Z6820 Body mass index (BMI) 20.0-20.9, adult: Secondary | ICD-10-CM | POA: Diagnosis not present

## 2022-04-05 ENCOUNTER — Encounter: Payer: Self-pay | Admitting: Physician Assistant

## 2022-04-05 ENCOUNTER — Ambulatory Visit (INDEPENDENT_AMBULATORY_CARE_PROVIDER_SITE_OTHER): Payer: Medicare HMO | Admitting: Physician Assistant

## 2022-04-05 VITALS — BP 156/67 | HR 77 | Temp 97.8°F | Ht 72.0 in | Wt 162.8 lb

## 2022-04-05 DIAGNOSIS — L02214 Cutaneous abscess of groin: Secondary | ICD-10-CM | POA: Diagnosis not present

## 2022-04-05 DIAGNOSIS — Z09 Encounter for follow-up examination after completed treatment for conditions other than malignant neoplasm: Secondary | ICD-10-CM

## 2022-04-05 NOTE — Patient Instructions (Addendum)
If you have any concerns or questions, please feel free to call our office.   Cellulitis, Adult  Cellulitis is a skin infection. The infected area is often warm, red, swollen, and sore. It occurs most often on the legs, feet, and toes, but can happen on any part of the body. This condition can be life-threatening without treatment. It is very important to get treated right away. What are the causes? This condition is caused by bacteria. The bacteria enter through a break in the skin, such as: A cut. A burn. A bug bite. An animal bite. An open sore. A crack. What increases the risk? Having a weak body's defense system (immune system). Being older than 81 years old. Having a blood sugar problem (diabetes). Having a long-term liver disease (cirrhosis) or kidney disease. Being very overweight (obese). Having a skin problem, such as: An itchy rash. A rash caused by a fungus. A rash with blisters. Slow movement of blood in the veins (venous stasis). Fluid buildup below the skin (edema). This condition is more likely to occur in people who: Have open cuts, burns, bites, or scrapes on the skin. Have been treated with high-energy rays (radiation). Use IV drugs. What are the signs or symptoms? Skin that: Looks red or purple, or slightly darker than your usual skin color. Has streaks. Has spots. Is swollen. Is sore or painful when you touch it. Is warm. A fever. Chills. Blisters. Tiredness (fatigue). How is this treated? Medicines to treat infections or allergies. Rest. Placing cold or warm cloths on the skin. Staying in the hospital, if the condition is very bad. You may need medicines through an IV. Follow these instructions at home: Medicines Take over-the-counter and prescription medicines only as told by your doctor. If you were prescribed antibiotics, take them as told by your doctor. Do not stop using them even if you start to feel better. General instructions Drink  enough fluid to keep your pee (urine) pale yellow. Do not touch or rub the infected area. Raise (elevate) the infected area above the level of your heart while you are sitting or lying down. Return to your normal activities when your doctor says that it is safe. Place cold or warm cloths on the area as told by your doctor. Keep all follow-up visits. Your doctor will need to make sure that a more serious infection is not developing. Contact a doctor if: You have a fever. You do not start to get better after 1-2 days of treatment. Your bone or joint under the infected area starts to hurt after the skin has healed. Your infection comes back in the same area or another area. Signs of this may include: You have a swollen bump in the area. Your red area gets larger, turns dark in color, or hurts more. You have more fluid coming from the wound. Pus or a bad smell develops in your infected area. You have more pain. You feel sick and have muscle aches and weakness. You develop vomiting or watery poop that will not go away. Get help right away if: You see red streaks coming from the area. You notice the skin turns purple or black and falls off. These symptoms may be an emergency. Get help right away. Call 911. Do not wait to see if the symptoms will go away. Do not drive yourself to the hospital. This information is not intended to replace advice given to you by your health care provider. Make sure you discuss any questions you  have with your health care provider. Document Revised: 08/24/2021 Document Reviewed: 08/24/2021 Elsevier Patient Education  [employer].

## 2022-04-05 NOTE — Progress Notes (Signed)
[facility] POST-OP OFFICE VISIT  04/05/2022  HPI: [name] Browning is a 82 y.o. male 30 days s/p incision and drainage of left inguinal abscess with Dr Dahlia Byes   The I&D site has completely healed No drainage No fever, chills No other complaints   Vital signs: BP (!) 156/67   Pulse 77   Temp 97.8 F (36.6 C) (Oral)   Ht 6' (1.829 m)   Wt 162 lb 12.8 oz (73.8 kg)   SpO2 94%   BMI 22.08 kg/m    Physical Exam: Constitutional: Well appearing male, NAD Skin: Chaperone present, previous I&D site to the left groin is well healed, no erythema   Assessment/Plan: This is a 82 y.o. male 30 days s/p incision and drainage of left inguinal abscess with Dr Dahlia Byes    - Nothing further from surgical perspective  - He can follow up on as needed basis; He understands to call with questions/concerns  -- Edison Simon, PA-C  [facility] 04/05/2022, 1:39 PM M-F: 7am - 4pm

## 2022-04-11 ENCOUNTER — Ambulatory Visit (INDEPENDENT_AMBULATORY_CARE_PROVIDER_SITE_OTHER): Payer: Medicare HMO | Admitting: Cardiovascular Disease

## 2022-04-11 ENCOUNTER — Encounter: Payer: Self-pay | Admitting: Cardiovascular Disease

## 2022-04-11 VITALS — BP 136/72 | HR 77 | Ht 74.0 in | Wt 167.6 lb

## 2022-04-11 DIAGNOSIS — I5032 Chronic diastolic (congestive) heart failure: Secondary | ICD-10-CM

## 2022-04-11 DIAGNOSIS — I2510 Atherosclerotic heart disease of native coronary artery without angina pectoris: Secondary | ICD-10-CM | POA: Diagnosis not present

## 2022-04-11 DIAGNOSIS — I10 Essential (primary) hypertension: Secondary | ICD-10-CM | POA: Diagnosis not present

## 2022-04-11 DIAGNOSIS — I480 Paroxysmal atrial fibrillation: Secondary | ICD-10-CM | POA: Diagnosis not present

## 2022-04-11 DIAGNOSIS — I25110 Atherosclerotic heart disease of native coronary artery with unstable angina pectoris: Secondary | ICD-10-CM

## 2022-04-11 DIAGNOSIS — E782 Mixed hyperlipidemia: Secondary | ICD-10-CM | POA: Diagnosis not present

## 2022-04-11 DIAGNOSIS — R079 Chest pain, unspecified: Secondary | ICD-10-CM

## 2022-04-11 MED ORDER — RANOLAZINE ER 1000 MG PO TB12: 1000.0000 mg | ORAL_TABLET | Freq: Two times a day (BID) | ORAL | 3 refills | Status: DC

## 2022-04-11 NOTE — Assessment & Plan Note (Signed)
Controlled. Patient reports he has not taken his medications this morning.

## 2022-04-11 NOTE — Progress Notes (Signed)
Cardiology Office Note   Date:  04/11/2022   ID:  [name] Browning, DOB 1940/07/04, MRN [id number]  PCP:  [place]  Cardiologist:  Neoma Laming, MD      History of Present Illness: [name] Browning is a 82 y.o. male who presents for  Chief Complaint  Patient presents with   Follow-up    4 week follow up    Patient in office for 4 week follow up.     Past Medical History:  Diagnosis Date   Anginal pain    Anxiety    Aortic atherosclerosis    Atrial fibrillation 01/2019   Bilateral carpal tunnel syndrome    CAD (coronary artery disease)    a.) LHC 2004 --> normal coronaries. b.) normal stress test in 2007 and 2011; c.) Lexiscan 05/20/2014 --> LVEF 55-65%; no significant stress induced ischemia/arrythmia. d.) CT chest 03/25/2019 --> coronaries carcified.   Carotid atherosclerosis, bilateral    Carpal tunnel syndrome, bilateral    CHF (congestive heart failure)    Chronic anticoagulation    a.) ASA + apixaban   COPD (chronic obstructive pulmonary disease)    CVA (cerebral vascular accident)    Degenerative disc disease, cervical    Diastolic dysfunction    a.) TTE 05/29/2014 --> LVEF 60-65%; G1DD.   DVT (deep venous thrombosis)    GERD (gastroesophageal reflux disease)    History of 2019 novel coronavirus disease (COVID-19) 02/08/2019   HLD (hyperlipidemia)    Hypertension    Kidney stones    Osteoarthritis of right shoulder    Pneumonia    Respiratory failure, acute 09/20/2020   a.) severe respiratory distress 1 hour after urological surgery. CXR (+) for acute pulmonary edema. Transferred to ICU and placed on NIPPV. Questionable aspiration PNA. (+) A.fib with RVR. Improved with ABX, diuresis, and amiodarone.   Skin cancer of face    a.) RIGHT ear and RIGHT forehead; excised.   Syncope    TIA (transient ischemic attack) 2016   Valvular regurgitation    a.) TTE 05/26/2014 --> LVEF 60-65%; trivial MR, mild TR; no AR or PR. b.) TTE 04/20/2015 --> LVEF 55-60%;  trivial MR and PR; no AR or TR.     Past Surgical History:  Procedure Laterality Date   CARDIAC CATHETERIZATION  2004   CARPAL TUNNEL RELEASE Right 06/11/2013   CENTRAL LINE INSERTION  11/14/2020   Procedure: CENTRAL LINE INSERTION;  Surgeon: Leonie Man, MD;  Location: [facility];  Service: Cardiovascular;;   CORONARY/GRAFT ACUTE MI REVASCULARIZATION N/A 11/14/2020   Procedure: Coronary/Graft Acute MI Revascularization;  Surgeon: Leonie Man, MD;  Location: [place] [facility];  Service: Cardiovascular;  Laterality: N/A;   CYSTOSCOPY W/ RETROGRADES  10/16/2020   Procedure: CYSTOSCOPY WITH RETROGRADE PYELOGRAM;  Surgeon: Billey Co, MD;  Location: [facility];  Service: Urology;;   CYSTOSCOPY W/ URETERAL STENT PLACEMENT Left 09/20/2020   Procedure: CYSTOSCOPY WITH RETROGRADE PYELOGRAM/URETERAL STENT PLACEMENT;  Surgeon: Janith Lima, MD;  Location: [facility];  Service: Urology;  Laterality: Left;   CYSTOSCOPY/URETEROSCOPY/HOLMIUM LASER/STENT PLACEMENT Left 10/16/2020   Procedure: CYSTOSCOPY/URETEROSCOPY/HOLMIUM LASER/STENT PLACEMENT;  Surgeon: Billey Co, MD;  Location: [facility];  Service: Urology;  Laterality: Left;   ESOPHAGOGASTRODUODENOSCOPY N/A 11/06/2020   Procedure: ESOPHAGOGASTRODUODENOSCOPY (EGD);  Surgeon: Lucilla Lame, MD;  Location: [facility];  Service: Endoscopy;  Laterality: N/A;   LEFT HEART CATH AND CORONARY ANGIOGRAPHY N/A 11/14/2020   Procedure: LEFT HEART CATH AND CORONARY ANGIOGRAPHY;  Surgeon: Leonie Man, MD;  Location: [facility];  Service: Cardiovascular;  Laterality: N/A;   LEFT HEART CATH AND CORONARY ANGIOGRAPHY Left 01/28/2022   Procedure: LEFT HEART CATH AND CORONARY ANGIOGRAPHY;  Surgeon: Dionisio David, MD;  Location: [place] [facility];  Service: Cardiovascular;  Laterality: Left;   SHOULDER ARTHROSCOPY WITH OPEN ROTATOR CUFF REPAIR Right 08/24/2017   Procedure: SHOULDER ARTHROSCOPY WITH OPEN ROTATOR CUFF REPAIR;   Surgeon: Corky Mull, MD;  Location: [facility];  Service: Orthopedics;  Laterality: Right;   SKIN CANCER EXCISION  12/01/2016   right ear    SKIN CANCER EXCISION     remove from the right side of the face    THROMBECTOMY Right 2004   leg   THYROIDECTOMY  1950   Not sure if total or partial thyroidectomy.     Current Outpatient Medications  Medication Sig Dispense Refill   acetaminophen (TYLENOL) 500 MG tablet Take 2 tablets (1,000 mg total) by mouth every 6 (six) hours as needed for mild pain.     aspirin EC 81 MG tablet Take 81 mg by mouth daily after supper.     atorvastatin (LIPITOR) 40 MG tablet Take 40 mg by mouth daily after supper.     cetirizine (ZYRTEC) 10 MG tablet Take 10 mg by mouth daily.     clopidogrel (PLAVIX) 75 MG tablet Take 1 tablet (75 mg total) by mouth daily. 90 tablet 3   famotidine (PEPCID) 20 MG tablet TAKE 1 TABLET BY MOUTH AFTER SUPPER 90 tablet 3   ferrous gluconate (FERGON) 240 (27 FE) MG tablet Take 240 mg by mouth 3 (three) times daily with meals.     fluticasone (FLONASE) 50 MCG/ACT nasal spray Place 1 spray into both nostrils daily.     isosorbide mononitrate (IMDUR) 30 MG 24 hr tablet Take 1 tablet (30 mg total) by mouth daily. 30 tablet 1   KLOR-CON M20 20 MEQ tablet TAKE 1 TABLET BY MOUTH EVERY DAY 90 tablet 3   losartan (COZAAR) 50 MG tablet Take 50 mg by mouth daily.     methenamine (HIPREX) 1 g tablet Take 1 tablet (1 g total) by mouth 2 (two) times daily with a meal. 60 tablet 3   metoprolol succinate (TOPROL-XL) 50 MG 24 hr tablet Take 25 mg by mouth daily.     montelukast (SINGULAIR) 10 MG tablet Take 10 mg by mouth at bedtime.     ondansetron (ZOFRAN-ODT) 4 MG disintegrating tablet Take 1 tablet (4 mg total) by mouth every 6 (six) hours as needed for nausea or vomiting. 20 tablet 0   pantoprazole (PROTONIX) 40 MG tablet TAKE 1 TABLET BY MOUTH TWICE A DAY 180 tablet 1   roflumilast (DALIRESP) 500 MCG TABS tablet Take 500 mcg by mouth daily.      tamsulosin (FLOMAX) 0.4 MG CAPS capsule Take 1 capsule (0.4 mg total) by mouth daily. 30 capsule 11   torsemide (DEMADEX) 20 MG tablet Take 3 tablets (60 mg total) by mouth daily. 270 tablet 3   ranolazine (RANEXA) 1000 MG SR tablet Take 1 tablet (1,000 mg total) by mouth 2 (two) times daily. 60 tablet 3   No current facility-administered medications for this visit.   Facility-Administered Medications Ordered in Other Visits  Medication Dose Route Frequency Provider Last Rate Last Admin   sodium chloride flush (NS) 0.9 % injection 3 mL  3 mL Intravenous Q12H Scoggins, Amber, NP        Allergies:  Hydromorphone    Social History:   reports that he quit smoking about 19 years ago. His smoking use included cigarettes. He started smoking about 65 years ago. He has a 46.00 pack-year smoking history. He has been exposed to tobacco smoke. He quit smokeless tobacco use about 19 years ago.  His smokeless tobacco use included snuff. He reports that he does not currently use alcohol after a past usage of about 7.0 standard drinks of alcohol per week. He reports that he does not use drugs.   Family History:  family history includes CAD in his father; Heart attack in his father; Heart disease in his mother; Hypertension in his mother.    ROS:     Review of Systems  Constitutional: Negative.   HENT: Negative.    Eyes: Negative.   Respiratory: Negative.    Cardiovascular: Negative.   Gastrointestinal: Negative.   Genitourinary: Negative.   Musculoskeletal: Negative.   Skin: Negative.   Neurological: Negative.   Endo/Heme/Allergies: Negative.   Psychiatric/Behavioral: Negative.    All other systems reviewed and are negative.   All other systems are reviewed and negative.   PHYSICAL EXAM: VS:  BP 136/72   Pulse 77   Ht 6\' 2"  (1.88 m)   Wt 167 lb 9.6 oz (76 kg)   SpO2 92%   BMI 21.52 kg/m  , BMI Body mass index is 21.52 kg/m. Last weight:  Wt Readings from Last 3 Encounters:   04/11/22 167 lb 9.6 oz (76 kg)  04/05/22 162 lb 12.8 oz (73.8 kg)  03/15/22 162 lb 6.4 oz (73.7 kg)    Physical Exam Vitals reviewed.  Constitutional:      Appearance: Normal appearance. He is normal weight.  HENT:     Head: Normocephalic.     Nose: Nose normal.     Mouth/Throat:     Mouth: Mucous membranes are moist.  Eyes:     Pupils: Pupils are equal, round, and reactive to light.  Cardiovascular:     Rate and Rhythm: Normal rate and regular rhythm.     Pulses: Normal pulses.     Heart sounds: Normal heart sounds.  Pulmonary:     Effort: Pulmonary effort is normal.  Abdominal:     General: Abdomen is flat. Bowel sounds are normal.  Musculoskeletal:        General: Normal range of motion.     Cervical back: Normal range of motion.  Skin:    General: Skin is warm.  Neurological:     General: No focal deficit present.     Mental Status: He is alert.  Psychiatric:        Mood and Affect: Mood normal.     EKG: none today  Recent Labs: 03/06/2022: Magnesium 2.1 03/14/2022: ALT 24; BUN 29; Creatinine, Ser 1.14; Hemoglobin 11.8; Platelets 287; Potassium 4.1; Sodium 139    Lipid Panel    Component Value Date/Time   CHOL 141 11/12/2020 0435   CHOL 175 06/01/2015 1049   TRIG 47 11/12/2020 0435   HDL 38 (L) 11/12/2020 0435   HDL 70 06/01/2015 1049   CHOLHDL 3.7 11/12/2020 0435   VLDL 9 11/12/2020 0435   LDLCALC 94 11/12/2020 0435   LDLCALC 105 (H) 12/07/2016 0958      Other studies Reviewed: none   ASSESSMENT AND PLAN:    ICD-10-CM   1. Chest pain, unspecified type  R07.9 ranolazine (RANEXA) 1000 MG SR tablet    2. Chronic heart failure with preserved ejection  fraction  I50.32     3. AF (paroxysmal atrial fibrillation)  I48.0     4. Coronary artery disease involving native coronary artery of native heart with unstable angina pectoris  I25.110     5. Primary hypertension  I10     6. Mixed hyperlipidemia  E78.2     7. Coronary artery disease involving  native coronary artery of native heart without angina pectoris  I25.10 ranolazine (RANEXA) 1000 MG SR tablet       Problem List Items Addressed This Visit       Cardiovascular and Mediastinum   HTN (hypertension)    Controlled. Patient reports he has not taken his medications this morning.       Relevant Medications   ranolazine (RANEXA) 1000 MG SR tablet   AF (paroxysmal atrial fibrillation)   Relevant Medications   ranolazine (RANEXA) 1000 MG SR tablet   Coronary artery disease   Relevant Medications   ranolazine (RANEXA) 1000 MG SR tablet   (HFpEF) heart failure with preserved ejection fraction   Relevant Medications   ranolazine (RANEXA) 1000 MG SR tablet     Other   Hyperlipidemia   Relevant Medications   ranolazine (RANEXA) 1000 MG SR tablet   Chest pain - Primary    Patient reports chest pain improved since restarting isosorbide, adding Ranexa 500 mg . Continues to have occasional "pinching" in chest. Increase Ranexa to 1,000 mg twice daily.      Relevant Medications   ranolazine (RANEXA) 1000 MG SR tablet     Disposition:   Return in about 4 weeks (around 05/09/2022).    Total time spent: 30 minutes  Signed,  Neoma Laming, MD  04/11/2022 9:41 AM    [facility]

## 2022-04-11 NOTE — Assessment & Plan Note (Signed)
Patient reports chest pain improved since restarting isosorbide, adding Ranexa 500 mg . Continues to have occasional "pinching" in chest. Increase Ranexa to 1,000 mg twice daily.

## 2022-04-11 NOTE — Patient Instructions (Signed)
Increase ranolazine to 1,000 mg twice daily 

## 2022-04-28 ENCOUNTER — Other Ambulatory Visit: Payer: Self-pay | Admitting: Cardiovascular Disease

## 2022-04-28 DIAGNOSIS — I2510 Atherosclerotic heart disease of native coronary artery without angina pectoris: Secondary | ICD-10-CM

## 2022-05-06 ENCOUNTER — Emergency Department
Admission: EM | Admit: 2022-05-06 | Discharge: 2022-05-06 | Disposition: A | Discharge status: Home / Self Care | Payer: Medicare HMO | Attending: Emergency Medicine | Admitting: Emergency Medicine

## 2022-05-06 ENCOUNTER — Emergency Department: Payer: Medicare HMO

## 2022-05-06 DIAGNOSIS — N3090 Cystitis, unspecified without hematuria: Secondary | ICD-10-CM | POA: Diagnosis not present

## 2022-05-06 DIAGNOSIS — Z8673 Personal history of transient ischemic attack (TIA), and cerebral infarction without residual deficits: Secondary | ICD-10-CM | POA: Insufficient documentation

## 2022-05-06 DIAGNOSIS — Z8616 Personal history of COVID-19: Secondary | ICD-10-CM | POA: Insufficient documentation

## 2022-05-06 DIAGNOSIS — I2510 Atherosclerotic heart disease of native coronary artery without angina pectoris: Secondary | ICD-10-CM | POA: Diagnosis not present

## 2022-05-06 DIAGNOSIS — J069 Acute upper respiratory infection, unspecified: Secondary | ICD-10-CM | POA: Diagnosis not present

## 2022-05-06 DIAGNOSIS — J441 Chronic obstructive pulmonary disease with (acute) exacerbation: Secondary | ICD-10-CM | POA: Diagnosis not present

## 2022-05-06 DIAGNOSIS — Z7901 Long term (current) use of anticoagulants: Secondary | ICD-10-CM | POA: Diagnosis not present

## 2022-05-06 DIAGNOSIS — R0602 Shortness of breath: Secondary | ICD-10-CM | POA: Diagnosis not present

## 2022-05-06 DIAGNOSIS — Z79899 Other long term (current) drug therapy: Secondary | ICD-10-CM | POA: Diagnosis not present

## 2022-05-06 DIAGNOSIS — I5033 Acute on chronic diastolic (congestive) heart failure: Secondary | ICD-10-CM | POA: Insufficient documentation

## 2022-05-06 DIAGNOSIS — N189 Chronic kidney disease, unspecified: Secondary | ICD-10-CM | POA: Insufficient documentation

## 2022-05-06 DIAGNOSIS — R300 Dysuria: Secondary | ICD-10-CM | POA: Diagnosis present

## 2022-05-06 DIAGNOSIS — I130 Hypertensive heart and chronic kidney disease with heart failure and stage 1 through stage 4 chronic kidney disease, or unspecified chronic kidney disease: Secondary | ICD-10-CM | POA: Insufficient documentation

## 2022-05-06 DIAGNOSIS — I480 Paroxysmal atrial fibrillation: Secondary | ICD-10-CM | POA: Insufficient documentation

## 2022-05-06 DIAGNOSIS — R079 Chest pain, unspecified: Secondary | ICD-10-CM | POA: Diagnosis not present

## 2022-05-06 DIAGNOSIS — J439 Emphysema, unspecified: Secondary | ICD-10-CM | POA: Diagnosis not present

## 2022-05-06 DIAGNOSIS — R531 Weakness: Secondary | ICD-10-CM | POA: Diagnosis not present

## 2022-05-06 LAB — URINALYSIS, ROUTINE W REFLEX MICROSCOPIC
Bilirubin Urine: NEGATIVE
Glucose, UA: NEGATIVE mg/dL
Hgb urine dipstick: NEGATIVE
Ketones, ur: NEGATIVE mg/dL
Nitrite: POSITIVE — AB
Protein, ur: 100 mg/dL — AB
Specific Gravity, Urine: 1.014 (ref 1.005–1.030)
Squamous Epithelial / HPF: NONE SEEN /HPF (ref 0–5)
WBC, UA: >50 WBC/hpf (ref 0–5)
pH: 8 (ref 5.0–8.0)

## 2022-05-06 LAB — CBC
HCT: 38.3 % — ABNORMAL LOW (ref 39.0–52.0)
Hemoglobin: 12 g/dL — ABNORMAL LOW (ref 13.0–17.0)
MCH: 28.6 pg (ref 26.0–34.0)
MCHC: 31.3 g/dL (ref 30.0–36.0)
MCV: 91.4 fL (ref 80.0–100.0)
Platelets: 194 10*3/uL (ref 150–400)
RBC: 4.19 MIL/uL — ABNORMAL LOW (ref 4.22–5.81)
RDW: 14.1 % (ref 11.5–15.5)
WBC: 8.9 10*3/uL (ref 4.0–10.5)
nRBC: 0 % (ref 0.0–0.2)

## 2022-05-06 LAB — COMPREHENSIVE METABOLIC PANEL
ALT: 12 U/L (ref 0–44)
AST: 26 U/L (ref 15–41)
Albumin: 3.7 g/dL (ref 3.5–5.0)
Alkaline Phosphatase: 164 U/L — ABNORMAL HIGH (ref 38–126)
Anion gap: 4 — ABNORMAL LOW (ref 5–15)
BUN: 22 mg/dL (ref 8–23)
CO2: 33 mmol/L — ABNORMAL HIGH (ref 22–32)
Calcium: 9 mg/dL (ref 8.9–10.3)
Chloride: 102 mmol/L (ref 98–111)
Creatinine, Ser: 1.1 mg/dL (ref 0.61–1.24)
GFR, Estimated: >60 mL/min (ref >60)
Glucose, Bld: 106 mg/dL — ABNORMAL HIGH (ref 70–99)
Potassium: 4 mmol/L (ref 3.5–5.1)
Sodium: 139 mmol/L (ref 135–145)
Total Bilirubin: 1 mg/dL (ref 0.3–1.2)
Total Protein: 7.2 g/dL (ref 6.5–8.1)

## 2022-05-06 LAB — TROPONIN I (HIGH SENSITIVITY)
Troponin I (High Sensitivity): 27 ng/L — ABNORMAL HIGH (ref <18)
Troponin I (High Sensitivity): 27 ng/L — ABNORMAL HIGH (ref <18)

## 2022-05-06 MED ORDER — ONDANSETRON 4 MG PO TBDP: 4.0000 mg | ORAL_TABLET | Freq: Three times a day (TID) | ORAL | 0 refills | Status: DC | PRN

## 2022-05-06 MED ORDER — AMOXICILLIN-POT CLAVULANATE 875-125 MG PO TABS
1.0000 | ORAL_TABLET | Freq: Once | ORAL | Status: AC
  Administered 2022-05-06: 1 via ORAL
  Filled 2022-05-06: qty 1

## 2022-05-06 MED ORDER — AMOXICILLIN-POT CLAVULANATE 875-125 MG PO TABS: 1.0000 | ORAL_TABLET | Freq: Two times a day (BID) | ORAL | 0 refills | Status: AC

## 2022-05-06 NOTE — Discharge Instructions (Signed)
Please continue to take the antibiotic you are already taking.  Please start taking Augmentin twice a day for the next 14 days.  Please follow-up with Dr. Richardo Hanks in about 1 week.  You may need to have a more extended course of antibiotics if you are not improving.  If you develop fever or having flank pain or significant abdominal pain then please return to the emergency department.

## 2022-05-06 NOTE — ED Provider Notes (Signed)
[facility] Provider Note    Event Date/Time   First MD Initiated Contact with Patient 05/06/22 2228     (approximate)   History   Shortness of Breath   HPI  [name] Browning is a 82 y.o. male past medical history of CHF, A-fib on apixaban, hypertension, coronary artery disease, history of multiple MDR UTIs who presents because of dysuria.  Patient tells me that he has long history of UTIs and is on suppressive antibiotic chronically.  Was doing well until yesterday when he developed burning urgency frequency and had some blood in his urine.  Denies flank or abdominal pain denies fevers or chills.  He has chronic difficulty with stream but this is unchanged today.  Also endorses runny nose cough congestion and change in his voice.  Does have mild sore throat.  Still eating and drinking.  Triage note says he is short of breath but patient is denying shortness of breath to me.  Denies chest pain.    Past Medical History:  Diagnosis Date   Anginal pain (HCC)    Anxiety    Aortic atherosclerosis (HCC)    Atrial fibrillation (HCC) 01/2019   Bilateral carpal tunnel syndrome    CAD (coronary artery disease)    a.) LHC 2004 --> normal coronaries. b.) normal stress test in 2007 and 2011; c.) Lexiscan 05/20/2014 --> LVEF 55-65%; no significant stress induced ischemia/arrythmia. d.) CT chest 03/25/2019 --> coronaries carcified.   Carotid atherosclerosis, bilateral    Carpal tunnel syndrome, bilateral    CHF (congestive heart failure) (HCC)    Chronic anticoagulation    a.) ASA + apixaban   COPD (chronic obstructive pulmonary disease) (HCC)    CVA (cerebral vascular accident) (HCC)    Degenerative disc disease, cervical    Diastolic dysfunction    a.) TTE 05/29/2014 --> LVEF 60-65%; G1DD.   DVT (deep venous thrombosis) (HCC)    GERD (gastroesophageal reflux disease)    History of 2019 novel coronavirus disease (COVID-19) 02/08/2019   HLD (hyperlipidemia)     Hypertension    Kidney stones    Osteoarthritis of right shoulder    Pneumonia    Respiratory failure, acute (HCC) 09/20/2020   a.) severe respiratory distress 1 hour after urological surgery. CXR (+) for acute pulmonary edema. Transferred to ICU and placed on NIPPV. Questionable aspiration PNA. (+) A.fib with RVR. Improved with ABX, diuresis, and amiodarone.   Skin cancer of face    a.) RIGHT ear and RIGHT forehead; excised.   Syncope    TIA (transient ischemic attack) 2016   Valvular regurgitation    a.) TTE 05/26/2014 --> LVEF 60-65%; trivial MR, mild TR; no AR or PR. b.) TTE 04/20/2015 --> LVEF 55-60%; trivial MR and PR; no AR or TR.    Patient Active Problem List   Diagnosis Date Noted   Cellulitis of groin 03/05/2022   Chronic obstructive pulmonary disease (COPD) (HCC) 03/05/2022   Chronic diastolic CHF (congestive heart failure) (HCC) 03/05/2022   Abscess of left groin 03/05/2022   Chest pain 01/20/2022   S/P coronary artery stent placement 03/20/2021   (HFpEF) heart failure with preserved ejection fraction (HCC) 03/13/2021   BPH (benign prostatic hyperplasia) 03/13/2021   CKD (chronic kidney disease) 03/13/2021   GERD (gastroesophageal reflux disease) 03/13/2021   Anemia 12/08/2020   Acute ST elevation myocardial infarction (STEMI) of anterior wall (HCC)    Cardiogenic shock (HCC)    Hemorrhagic shock (HCC)    Acute on  chronic diastolic CHF (congestive heart failure) (HCC) 11/11/2020   GI bleeding 11/11/2020   DVT (deep venous thrombosis) (HCC)    Dyslipidemia    Stroke [facility])    Coronary artery disease    NSTEMI (non-ST elevated myocardial infarction) (HCC)    Hypokalemia    Thrombocytopenia (HCC)    Iron deficiency anemia    Dysphagia    Lactic acidosis    Atrial fibrillation (HCC)    Septic shock (HCC) 10/21/2020   Acute on chronic respiratory failure with hypoxia (HCC) 10/20/2020   Community acquired pneumonia 10/20/2020   Hypotension 10/20/2020   DOE  (dyspnea on exertion) 10/06/2020   Chronic respiratory failure with hypoxia (HCC) 10/06/2020   Malnutrition of moderate degree 09/21/2020   Urinary tract obstruction by kidney stone 09/19/2020   AKI (acute kidney injury) (HCC) 09/19/2020   COPD exacerbation (HCC) 09/18/2020   Acute respiratory failure with hypoxia (HCC) 09/18/2020   AF (paroxysmal atrial fibrillation) (HCC) 09/18/2020   Umbilical hernia without obstruction and without gangrene    Pneumonia 03/25/2019   History of 2019 novel coronavirus disease (COVID-19) 03/06/2019   COVID-19 virus infection 02/08/2019   New onset atrial fibrillation (HCC) 02/08/2019   COPD, moderate (HCC) 09/14/2018   Degenerative tear of glenoid labrum of right shoulder 08/24/2017   Injury of tendon of long head of right biceps 08/21/2017   Rotator cuff tendinitis, right 08/21/2017   Traumatic complete tear of right rotator cuff 08/21/2017   Chronic neck pain 03/09/2017   Degenerative disc disease, cervical 03/09/2017   Perennial allergic rhinitis 03/09/2017   Hyperglycemia 09/01/2015   Elevated rheumatoid factor 04/08/2015   Arthritis of both hands 04/01/2015   Bilateral hand pain 03/18/2015   Elevated alkaline phosphatase level 03/02/2015   Anxiety 02/16/2015   Intermittent chest pain 02/16/2015   History of TIA (transient ischemic attack) 05/18/2014   HTN (hypertension) 05/18/2014   Hyperlipidemia 05/18/2014   Status post carpal tunnel release 06/26/2013     Physical Exam  Triage Vital Signs: ED Triage Vitals  Enc Vitals Group     BP 05/06/22 1938 (!) 142/77     Pulse Rate 05/06/22 1938 71     Resp 05/06/22 1938 20     Temp 05/06/22 1938 98.4 F (36.9 C)     Temp Source 05/06/22 1938 Oral     SpO2 05/06/22 1938 96 %     Weight 05/06/22 1939 167 lb (75.8 kg)     Height --      Head Circumference --      Peak Flow --      Pain Score 05/06/22 1938 9     Pain Loc --      Pain Edu? --      Excl. in GC? --     Most recent vital  signs: Vitals:   05/06/22 2248 05/06/22 2309  BP: 128/68   Pulse: (!) 36 (!) 50  Resp: 18   Temp: 97.6 F (36.4 C)   SpO2: 96%      General: Awake, no distress.  CV:  Good peripheral perfusion.  Resp:  Normal effort.  Lung sounds are clear Abd:  No distention. Soft nontender  Neuro:             Awake, Alert, Oriented x 3  Other:  Nml external genitalia  Pts voice is high pitched/ cracking, normal appearance of the posterior oropharynx, no erythema, patient is tolerating secretions no stridor   ED Results / Procedures / Treatments  Labs (all labs ordered are listed, but only abnormal results are displayed) Labs Reviewed  CBC - Abnormal; Notable for the following components:      Result Value   RBC 4.19 (*)    Hemoglobin 12.0 (*)    HCT 38.3 (*)    All other components within normal limits  COMPREHENSIVE METABOLIC PANEL - Abnormal; Notable for the following components:   CO2 33 (*)    Glucose, Bld 106 (*)    Alkaline Phosphatase 164 (*)    Anion gap 4 (*)    All other components within normal limits  URINALYSIS, ROUTINE W REFLEX MICROSCOPIC - Abnormal; Notable for the following components:   Color, Urine AMBER (*)    APPearance CLOUDY (*)    Protein, ur 100 (*)    Nitrite POSITIVE (*)    Leukocytes,Ua MODERATE (*)    Bacteria, UA MANY (*)    All other components within normal limits  TROPONIN I (HIGH SENSITIVITY) - Abnormal; Notable for the following components:   Troponin I (High Sensitivity) 27 (*)    All other components within normal limits  TROPONIN I (HIGH SENSITIVITY) - Abnormal; Notable for the following components:   Troponin I (High Sensitivity) 27 (*)    All other components within normal limits  URINE CULTURE     EKG  EKG shows atrial fibrillation rate controlled low voltage subtle ST depression V5 V6 lead II aVF similar to prior   RADIOLOGY I reviewed and interpreted the CXR which does not show any acute cardiopulmonary  process    PROCEDURES:  Critical Care performed: No  .1-3 Lead EKG Interpretation  Performed by: Georga Hacking, MD Authorized by: Georga Hacking, MD     Interpretation: abnormal     ECG rate assessment: bradycardic     Rhythm: atrial fibrillation     Ectopy: none     Conduction: normal     The patient is on the cardiac monitor to evaluate for evidence of arrhythmia and/or significant heart rate changes.   MEDICATIONS ORDERED IN ED: Medications  amoxicillin-clavulanate (AUGMENTIN) 875-125 MG per tablet 1 tablet (has no administration in time range)     IMPRESSION / MDM / ASSESSMENT AND PLAN / ED COURSE  I reviewed the triage vital signs and the nursing notes.                              Patient's presentation is most consistent with acute complicated illness / injury requiring diagnostic workup.  Differential diagnosis includes, but is not limited to, cystitis, pyelonephritis, colonization, pneumonia, viral URI, laryngitis  The patient is a 82 year old male with history of significant chronic lower urinary tract symptoms and frequent UTIs as well as significant cardiac history who presents today primarily with dysuria hematuria urgency frequency.  He has had issues with urinary symptoms for some time but had been doing well since being on 4-week course of Augmentin several months ago.  The symptoms started yesterday and he endorses burning with peeing and occasional blood-tinged urine as well as urgency and frequency.  Denying fever flank pain or abdominal pain.  He is on methenamine chronically for suppression.  Patient also endorses upper respiratory tract symptoms he is having cough runny nose congestion and some voice hoarseness.  Patient is bradycardic in A-fib stable EKG.  On exam overall he looks well.  His voice is high-pitched and cracking but he is not actually hoarse he has  no stridor is tolerating secretion has a normal posterior oropharynx exam.  Suspect  laryngitis.  Lung sounds are clear.  Abdomen is benign and external GU exam is also reassuring.    A chest x-ray was obtained as patient was endorsing dyspnea in triage although he denied this to me does not show any infiltrate.  Patient saturating well on room air.  Troponin was also drawn from triage and is stable x 2 at 27.  Patient not having chest pain.  He has no leukocytosis.  Renal function is stable.  Urinalysis is consistent with UTI.  Last urine culture growing MDR E. coli sensitive to Augmentin.  I reviewed Dr. Keane Scrape note from January describing patient's long history of UTIs and what has been tried in the past.  Looks like a 4-week course of Augmentin is what made his symptoms improved.  Currently does have difficulty tolerating this and does get frequent vomiting.  I think that Augmentin is probably the best option at this point.  I think this is not pyelonephritis just cystitis.  Will prescribe 2-week course of Augmentin.  I did talk to patient's granddaughter and patient about need to follow-up with urology as they may want to extend the course if his symptoms or not completely improved.  Discussed return precautions for fever or flank pain.       FINAL CLINICAL IMPRESSION(S) / ED DIAGNOSES   Final diagnoses:  Cystitis  Upper respiratory tract infection, unspecified type     Rx / DC Orders   ED Discharge Orders          Ordered    amoxicillin-clavulanate (AUGMENTIN) 875-125 MG tablet  2 times daily        05/06/22 2311    ondansetron (ZOFRAN-ODT) 4 MG disintegrating tablet  Every 8 hours PRN        05/06/22 2311             Note:  This document was prepared using Dragon voice recognition software and may include unintentional dictation errors.   Georga Hacking, MD 05/06/22 [phone]

## 2022-05-06 NOTE — ED Triage Notes (Signed)
Pt sts that he has been a little SOB since this AM. Pt voice is squeaking and cracking when he talks. Pt sts that started this AM also and that he is weak. Pt sts that the reason he came in was due to groin pain with urinating blood. Pt sts that he has an infection that he is unable to get arid of.

## 2022-05-09 ENCOUNTER — Ambulatory Visit: Payer: Medicare HMO | Admitting: Cardiovascular Disease

## 2022-05-09 LAB — URINE CULTURE: Culture: >=100000 — AB

## 2022-05-11 ENCOUNTER — Telehealth: Payer: Self-pay | Admitting: *Deleted

## 2022-05-11 ENCOUNTER — Telehealth: Payer: Self-pay

## 2022-05-11 DIAGNOSIS — I4891 Unspecified atrial fibrillation: Secondary | ICD-10-CM

## 2022-05-11 DIAGNOSIS — I10 Essential (primary) hypertension: Secondary | ICD-10-CM

## 2022-05-11 DIAGNOSIS — I5033 Acute on chronic diastolic (congestive) heart failure: Secondary | ICD-10-CM

## 2022-05-11 NOTE — Telephone Encounter (Signed)
Transition Care Management Follow-up Telephone Call Date of discharge and from where: [facility] 05/06/2022 How have you been since you were released from the hospital? 5 days Any questions or concerns? Call first number the second one does not work , patient does not feel the need for follow up with pcp at this point, however patient stated that he has medicines that are 30 or 40 dollars and just does not get them   Items Reviewed: Did the pt receive and understand the discharge instructions provided? Yes  Medications obtained and verified? No  Other? No  Any new allergies since your discharge? No  Dietary orders reviewed? No Do you have support at home? No     Follow up appointments reviewed:  PCP Hospital f/u appt confirmed? No  Nothing he can do if I need it I will get there  Are transportation arrangements needed? No  If their condition worsens, is the pt aware to call PCP or go to the Emergency Dept.? Yes Was the patient provided with contact information for the PCP's office or ED? Yes Was to pt encouraged to call back with questions or concerns? Yes  Alois Cliche -Berneda Rose [facility] [place], Population Health [phone] [address] [place] , [place] [address] Email : [email or web address]

## 2022-05-11 NOTE — Telephone Encounter (Signed)
Transition Care Management Unsuccessful Follow-up Telephone Call  Date of discharge and from where:  [facility]  05/06/2022  Attempts:  1st Attempt  Reason for unsuccessful TCM follow-up call:  Missing or invalid number

## 2022-05-11 NOTE — Progress Notes (Signed)
  Care Coordination  Outreach Note  05/11/2022 Name: [name] Browning MRN: [id number] DOB: 1940/05/01   Care Coordination Outreach Attempts: An unsuccessful telephone outreach was attempted today to offer the patient information about available care coordination services.  Follow Up Plan:  Additional outreach attempts will be made to offer the patient care coordination information and services.   Encounter Outcome:  No Answer  Burman Nieves, CCMA Care Coordination Care Guide Direct Dial: [phone]

## 2022-05-12 ENCOUNTER — Telehealth: Payer: Self-pay

## 2022-05-12 NOTE — Progress Notes (Signed)
[facility] Customer service manager [facility]) Quality Pharmacy Team [facility]   05/12/2022  [name] Browning December 23, 1940 [id number]  Reason for referral: Medication Assistance  Referral source:  [facility] Population Health Care Guide Current insurance: [facility]  Reason for call: Medication Assistance  Outreach:  Unsuccessful telephone call attempt #1 to patient.   Unable to leave message  Plan:  -I will make another outreach attempt to patient within 3-4 business days.  Thank you for allowing [facility] pharmacy to be a part of this patient's care.   Harlon Flor, PharmD Clinical Pharmacist  [facility] [employer] [phone]

## 2022-05-12 NOTE — Progress Notes (Signed)
  Care Coordination  Outreach Note  05/12/2022 Name: KEVANTE LUNT MRN: [id number] DOB: [birth year]   Care Coordination Outreach Attempts: A second unsuccessful outreach was attempted today to offer the patient with information about available care coordination services.  Follow Up Plan:  Additional outreach attempts will be made to offer the patient care coordination information and services.   Encounter Outcome:  No Answer  Burman Nieves, CCMA Care Coordination Care Guide Direct Dial: [phone]

## 2022-05-16 NOTE — Progress Notes (Signed)
  Care Coordination   Note   05/16/2022 Name: ELIGIO MEKELBURG MRN: [id number] DOB: 1940-11-19  [name] is a 82 y.o. year old male who sees [facility] for primary care. I reached out to [name] by phone today to offer care coordination services.  Mr. Ashcraft was given information about Care Coordination services today including:   The Care Coordination services include support from the care team which includes your Nurse Coordinator, Clinical Social Worker, or Pharmacist.  The Care Coordination team is here to help remove barriers to the health concerns and goals most important to you. Care Coordination services are voluntary, and the patient may decline or stop services at any time by request to their care team member.   Care Coordination Consent Status: Patient agreed to services and verbal consent obtained.   Follow up plan:  Telephone appointment with care coordination team member scheduled for:  05/20/2022  Encounter Outcome:  Pt. Scheduled from referral   Burman Nieves, [facility] Care Coordination Care Guide Direct Dial: [phone]

## 2022-05-20 ENCOUNTER — Ambulatory Visit: Payer: Self-pay

## 2022-05-20 NOTE — Progress Notes (Signed)
[facility] Customer service manager [facility]) Quality Pharmacy Team [facility]   05/20/2022  [name] Browning 03-May-1940 [id number]  Reason for referral: Medication Assistance  Referral source:  [facility] Population Health Care Guide Current insurance: [facility]  Reason for call: Medication Assistance  Outreach:  Successful telephone call with patient. I was able to speak to [name] Browning today in regards to his referral for medication assistance. Upon review of his medication list, the patient is currently filling generic medications which do not qualify for patient assistance programs.   The patient could not remember which specific medication he was having financial barriers with. The patient requests to call me back after he speaks with his PCP, and he is currently treating an acute issue at this time.  Plan:  The patient requests to reach back out to me when he is ready to resume the medication assistance screening.   Thank you for allowing [facility] pharmacy to be a part of this patient's care.   Harlon Flor, PharmD Clinical Pharmacist  [facility] [employer] [phone]

## 2022-05-20 NOTE — Patient Outreach (Signed)
  Care Coordination   Initial Visit Note   05/20/2022 Name: [name] Browning MRN: [id number] DOB: 1940/12/30  [name] Browning is a 82 y.o. year old male who sees [facility] for primary care. I spoke with  [name] Browning by phone today.  What matters to the patients health and wellness today?  Patient reports recent ED visit for UTI symptoms. Patient states he has been dealing with recurrent UTI's over the past 2 years. He reports having urology follow up with numerous treatment attempts at managing UTI's.  He states he currently on antibiotic treatment.  Per chart review next follow up with urologist is 07/19/22.  Patient reports managing several chronic health conditions.  He reports having chest discomfort 1-2 times per week.  He states his provider is aware of this and he is being managed with medication.  Per chart review next cardiology follow up is 11/03/22.  Patient reports being independent in his care. He states he lives with his Ex-wife and has a granddaughter who is a Engineer, civil (consulting) that lives next door. He states his granddaughter manages his pill box and assists him with his care if and/ or when needed.     Goals Addressed             This Visit's Progress    Education / management of chronic health conditions       Interventions Today    Flowsheet Row Most Recent Value  Chronic Disease   Chronic disease during today's visit Congestive Heart Failure (CHF), Atrial Fibrillation (AFib), Other, Hypertension (HTN)  [recurrent UTI's]  General Interventions   General Interventions Discussed/Reviewed General Interventions Discussed, Doctor Visits  [evaluation of current treatment plan for HF, A-fib, HTN, recurrent UTI's and patients adherence to plan as established by provider.  Discussed recent ED visits and need for follow up visit with PCP.  Assessed for ongoing UTI, CP symptoms.]  Doctor Visits Discussed/Reviewed Doctor Visits Discussed  [facility] upcoming provider appointments. Called PCP  office to reschedule patients cancelled appointment. Message left at PCP office for return call.]  Education Interventions   Education Provided Provided Printed Education, Provided Education  [Discussed heart failure/ atrial fibrillation signs/ symptoms. Encouraged monitoring blood pressure 1-2 x per week. Mailed patient education articles on HF, A-fiB, HTN.]  Pharmacy Interventions   Pharmacy Dicussed/Reviewed Pharmacy Topics Discussed  [medications reviewed and compliance discussed.  Confirmed patient able to obtain medications.]              SDOH assessments and interventions completed:  Yes  SDOH Interventions Today    Flowsheet Row Most Recent Value  SDOH Interventions   Food Insecurity Interventions Intervention Not Indicated  Housing Interventions Intervention Not Indicated  Transportation Interventions Intervention Not Indicated        Care Coordination Interventions:  Yes, provided   Follow up plan: Follow up call scheduled for 06/22/22    Encounter Outcome:  Pt. Visit Completed   George Ina RN,BSN,CCM [facility] Care Coordination [phone] direct line

## 2022-05-20 NOTE — Patient Instructions (Addendum)
Visit Information  Thank you for taking time to visit with me today. Please don't hesitate to contact me if I can be of assistance to you.   Following are the goals we discussed today:  Take medications as prescribed  Follow up with providers as recommended.  Monitor to weight, blood pressure.  Notify provider for 3 lb weight gain overnight or 5 lbs in a week or increase in shortness of breath, swelling in feet, ankles, legs   Our next appointment is by telephone on 06/22/22 at 11 am  Please call the care guide team at [phone] if you need to cancel or reschedule your appointment.   If you are experiencing a Mental Health or Behavioral Health Crisis or need someone to talk to, please call the Suicide and Crisis Lifeline: 988 call 1-800-273-TALK (toll free, 24 hour hotline)  Patient verbalizes understanding of instructions and care plan provided today and agrees to view in MyChart. Active MyChart status and patient understanding of how to access instructions and care plan via MyChart confirmed with patient.     [name] Ina RN,BSN,CCM [facility] Care Coordination [phone] direct line  Heart Failure Action Plan A heart failure action plan helps you understand what to do when you have symptoms of heart failure. Your action plan is a color-coded plan that lists the symptoms to watch for and indicates what actions to take. If you have symptoms in the red zone, you need medical care right away. If you have symptoms in the yellow zone, you are having problems. If you have symptoms in the [place] zone, you are doing well. Follow the plan that was created by you and your health care provider. Review your plan each time you visit your health care provider. Red zone These signs and symptoms mean you should get medical help right away: You have trouble breathing when resting. You have a dry cough that is getting worse. You have swelling or pain in your legs or abdomen that is getting worse. You  suddenly gain more than 2-3 lb (0.9-1.4 kg) in 24 hours, or more than 5 lb (2.3 kg) in a week. This amount may be more or less depending on your condition. You have trouble staying awake or you feel confused. You have chest pain. You do not have an appetite. You pass out. You have worsening sadness or depression. If you have any of these symptoms, call your local emergency services (911 in the [place] right away. Do not drive yourself to the hospital. Yellow zone These signs and symptoms mean your condition may be getting worse and you should make some changes: You have trouble breathing when you are active, or you need to sleep with your head raised on extra pillows to help you breathe. You have swelling in your legs or abdomen. You gain 2-3 lb (0.9-1.4 kg) in 24 hours, or 5 lb (2.3 kg) in a week. This amount may be more or less depending on your condition. You get tired easily. You have trouble sleeping. You have a dry cough. If you have any of these symptoms: Contact your health care provider within the next day. Your health care provider may adjust your medicines. [name] [place]ning zone These signs mean you are doing well and can continue what you are doing: You do not have shortness of breath. You have very little swelling or no new swelling. Your weight is stable (no gain or loss). You have a normal activity level. You do not have chest pain or any other  new symptoms. Follow these instructions at home: Take over-the-counter and prescription medicines only as told by your health care provider. Weigh yourself daily. Your target weight is __________ lb (__________ kg). Call your health care provider if you gain more than __________ lb (__________ kg) in 24 hours, or more than __________ lb (__________ kg) in a week. Health care provider name: _____________________________________________________ Health care provider phone number: _____________________________________________________ Eat a  heart-healthy diet. Work with a diet and nutrition specialist (dietitian) to create an eating plan that is best for you. Keep all follow-up visits. This is important. Where to find more information American Heart Association: Summary A heart failure action plan helps you understand what to do when you have symptoms of heart failure. Follow the action plan that was created by you and your health care provider. Get help right away if you have any symptoms in the red zone. This information is not intended to replace advice given to you by your health care provider. Make sure you discuss any questions you have with your health care provider. Document Revised: 04/06/2021 Document Reviewed: 08/12/2019 Elsevier Patient Education  2023 [employer]  Atrial Fibrillation Atrial fibrillation (AFib) is a type of heartbeat that is irregular or fast. If you have AFib, your heart beats without any order. This makes it hard for your heart to pump blood in a normal way. AFib may come and go, or it may become a long-lasting problem. If AFib is not treated, it can put you at higher risk for stroke, heart failure, and other heart problems. What are the causes? AFib may be caused by diseases that damage the heart's electrical system. They include: High blood pressure. Heart failure. Heart valve diseases. Heart surgery. Diabetes. Thyroid disease. Kidney disease. Lung diseases, such as pneumonia or COPD. Sleep apnea. Sometimes the cause is not known. What increases the risk? You are more likely to develop AFib if: You are older. You exercise often and very hard. You have a family history of AFib. You are male. You are Caucasian. You are overweight. You smoke. You drink a lot of alcohol. What are the signs or symptoms? Common symptoms of this condition include: A feeling that your heart is beating very fast. Chest pain or discomfort. Feeling short of breath. Suddenly feeling light-headed or  weak. Getting tired easily during activity. Fainting. Sweating. In some cases, there are no symptoms. How is this treated? Medicines to: Prevent blood clots. Treat heart rate or heart rhythm problems. Using devices, such as a pacemaker, to correct heart rhythm problems. Doing surgery to remove the part of the heart that sends bad signals. Closing an area where clots can form in the heart (left atrial appendage). In some cases, your doctor will treat other underlying conditions. Follow these instructions at home: Medicines Take over-the-counter and prescription medicines only as told by your doctor. Do not take any new medicines without first talking to your doctor. If you are taking blood thinners: Talk with your doctor before taking aspirin or NSAIDs, such as ibuprofen. Take your medicines as told. Take them at the same time each day. Do not do things that could hurt or bruise you. Be careful to avoid falls. Wear an alert bracelet or carry a card that says you take blood thinners. Lifestyle Do not smoke or use any products that contain nicotine or tobacco. If you need help quitting, ask your doctor. Eat heart-healthy foods. Talk with your doctor about the right eating plan for you. Exercise regularly as told  by your doctor. Do not drink alcohol. Lose weight if you are overweight. General instructions If you have sleep apnea, treat it as told by your doctor. Do not use diet pills unless your doctor says they are safe for you. Diet pills may make heart problems worse. Keep all follow-up visits. Your doctor will check your heart rate and rhythm regularly. Contact a doctor if: You notice a change in the speed, rhythm, or strength of your heartbeat. You are taking a blood-thinning medicine and you get more bruising. You get tired more easily when you move or exercise. You have a sudden change in weight. Get help right away if:  You have pain in your chest. You have trouble  breathing. You have side effects of blood thinners, such as blood in your vomit, poop (stool), or pee (urine), or bleeding that cannot stop. You have any signs of a stroke. "BE FAST" is an easy way to remember the main warning signs: B - Balance. Dizziness, sudden trouble walking, or loss of balance. E - Eyes. Trouble seeing or a change in how you see. F - Face. Sudden weakness or loss of feeling in the face. The face or eyelid may droop on one side. A - Arms.Weakness or loss of feeling in an arm. This happens suddenly and usually on one side of the body. S - Speech. Sudden trouble speaking, slurred speech, or trouble understanding what people say. T - Time.Time to call emergency services. Write down what time symptoms started. You have other signs of a stroke, such as: A sudden, very bad headache with no known cause. Feeling like you may vomit (nausea). Vomiting. A seizure. These symptoms may be an emergency. Get help right away. Call 911. Do not wait to see if the symptoms will go away. Do not drive yourself to the hospital. This information is not intended to replace advice given to you by your health care provider. Make sure you discuss any questions you have with your health care provider. Document Revised: 09/15/2021 Document Reviewed: 09/15/2021 Elsevier Patient Education  2023 [employer]  Managing Your Hypertension Hypertension, also called high blood pressure, is when the force of the blood pressing against the walls of the arteries is too strong. Arteries are blood vessels that carry blood from your heart throughout your body. Hypertension forces the heart to work harder to pump blood and may cause the arteries to become narrow or stiff. Understanding blood pressure readings A blood pressure reading includes a higher number over a lower number: The first, or top, number is called the systolic pressure. It is a measure of the pressure in your arteries as your heart beats. The  second, or bottom number, is called the diastolic pressure. It is a measure of the pressure in your arteries as the heart relaxes. For most people, a normal blood pressure is below 120/80. Your personal target blood pressure may vary depending on your medical conditions, your age, and other factors. Blood pressure is classified into four stages. Based on your blood pressure reading, your health care provider may use the following stages to determine what type of treatment you need, if any. Systolic pressure and diastolic pressure are measured in a unit called millimeters of mercury (mmHg). Normal Systolic pressure: below 120. Diastolic pressure: below 80. Elevated Systolic pressure: 120-129. Diastolic pressure: below 80. Hypertension stage 1 Systolic pressure: 130-139. Diastolic pressure: 80-89. Hypertension stage 2 Systolic pressure: 140 or above. Diastolic pressure: 90 or above. How can this condition affect  me? Managing your hypertension is very important. Over time, hypertension can damage the arteries and decrease blood flow to parts of the body, including the brain, heart, and kidneys. Having untreated or uncontrolled hypertension can lead to: A heart attack. A stroke. A weakened blood vessel (aneurysm). Heart failure. Kidney damage. Eye damage. Memory and concentration problems. Vascular dementia. What actions can I take to manage this condition? Hypertension can be managed by making lifestyle changes and possibly by taking medicines. Your health care provider will help you make a plan to bring your blood pressure within a normal range. You may be referred for counseling on a healthy diet and physical activity. Nutrition  Eat a diet that is high in fiber and potassium, and low in salt (sodium), added sugar, and fat. An example eating plan is called the DASH diet. DASH stands for Dietary Approaches to Stop Hypertension. To eat this way: Eat plenty of fresh fruits and vegetables.  Try to fill one-half of your plate at each meal with fruits and vegetables. Eat whole grains, such as whole-wheat pasta, brown rice, or whole-grain bread. Fill about one-fourth of your plate with whole grains. Eat low-fat dairy products. Avoid fatty cuts of meat, processed or cured meats, and poultry with skin. Fill about one-fourth of your plate with lean proteins such as fish, chicken without skin, beans, eggs, and tofu. Avoid pre-made and processed foods. These tend to be higher in sodium, added sugar, and fat. Reduce your daily sodium intake. Many people with hypertension should eat less than 1,500 mg of sodium a day. Lifestyle  Work with your health care provider to maintain a healthy body weight or to lose weight. Ask what an ideal weight is for you. Get at least 30 minutes of exercise that causes your heart to beat faster (aerobic exercise) most days of the week. Activities may include walking, swimming, or biking. Include exercise to strengthen your muscles (resistance exercise), such as weight lifting, as part of your weekly exercise routine. Try to do these types of exercises for 30 minutes at least 3 days a week. Do not use any products that contain nicotine or tobacco. These products include cigarettes, chewing tobacco, and vaping devices, such as e-cigarettes. If you need help quitting, ask your health care provider. Control any long-term (chronic) conditions you have, such as high cholesterol or diabetes. Identify your sources of stress and find ways to manage stress. This may include meditation, deep breathing, or making time for fun activities. Alcohol use Do not drink alcohol if: Your health care provider tells you not to drink. You are pregnant, may be pregnant, or are planning to become pregnant. If you drink alcohol: Limit how much you have to: 0-1 drink a day for women. 0-2 drinks a day for men. Know how much alcohol is in your drink. In the [place] one drink equals one 12 oz  bottle of beer (355 mL), one 5 oz glass of wine (148 mL), or one 1 oz glass of hard liquor (44 mL). Medicines Your health care provider may prescribe medicine if lifestyle changes are not enough to get your blood pressure under control and if: Your systolic blood pressure is 130 or higher. Your diastolic blood pressure is 80 or higher. Take medicines only as told by your health care provider. Follow the directions carefully. Blood pressure medicines must be taken as told by your health care provider. The medicine does not work as well when you skip doses. Skipping doses also puts you at  risk for problems. Monitoring Before you monitor your blood pressure: Do not smoke, drink caffeinated beverages, or exercise within 30 minutes before taking a measurement. Use the bathroom and empty your bladder (urinate). Sit quietly for at least 5 minutes before taking measurements. Monitor your blood pressure at home as told by your health care provider. To do this: Sit with your back straight and supported. Place your feet flat on the floor. Do not cross your legs. Support your arm on a flat surface, such as a table. Make sure your upper arm is at heart level. Each time you measure, take two or three readings one minute apart and record the results. You may also need to have your blood pressure checked regularly by your health care provider. General information Talk with your health care provider about your diet, exercise habits, and other lifestyle factors that may be contributing to hypertension. Review all the medicines you take with your health care provider because there may be side effects or interactions. Keep all follow-up visits. Your health care provider can help you create and adjust your plan for managing your high blood pressure. Where to find more information National Heart, Lung, and Blood Institute: [employer]is American Heart Association: www.heart.org Contact a health care provider  if: You think you are having a reaction to medicines you have taken. You have repeated (recurrent) headaches. You feel dizzy. You have swelling in your ankles. You have trouble with your vision. Get help right away if: You develop a severe headache or confusion. You have unusual weakness or numbness, or you feel faint. You have severe pain in your chest or abdomen. You vomit repeatedly. You have trouble breathing. These symptoms may be an emergency. Get help right away. Call 911. Do not wait to see if the symptoms will go away. Do not drive yourself to the hospital. Summary Hypertension is when the force of blood pumping through your arteries is too strong. If this condition is not controlled, it may put you at risk for serious complications. Your personal target blood pressure may vary depending on your medical conditions, your age, and other factors. For most people, a normal blood pressure is less than 120/80. Hypertension is managed by lifestyle changes, medicines, or both. Lifestyle changes to help manage hypertension include losing weight, eating a healthy, low-sodium diet, exercising more, stopping smoking, and limiting alcohol. This information is not intended to replace advice given to you by your health care provider. Make sure you discuss any questions you have with your health care provider. Document Revised: 09/10/2020 Document Reviewed: 09/10/2020 Elsevier Patient Education  2023 [employer].

## 2022-05-24 ENCOUNTER — Ambulatory Visit (INDEPENDENT_AMBULATORY_CARE_PROVIDER_SITE_OTHER): Payer: Medicare HMO | Admitting: Cardiovascular Disease

## 2022-05-24 ENCOUNTER — Encounter: Payer: Self-pay | Admitting: Cardiovascular Disease

## 2022-05-24 VITALS — BP 110/76 | HR 73 | Ht 74.0 in | Wt 166.4 lb

## 2022-05-24 DIAGNOSIS — I10 Essential (primary) hypertension: Secondary | ICD-10-CM | POA: Diagnosis not present

## 2022-05-24 DIAGNOSIS — I25110 Atherosclerotic heart disease of native coronary artery with unstable angina pectoris: Secondary | ICD-10-CM | POA: Diagnosis not present

## 2022-05-24 DIAGNOSIS — E782 Mixed hyperlipidemia: Secondary | ICD-10-CM

## 2022-05-24 DIAGNOSIS — I480 Paroxysmal atrial fibrillation: Secondary | ICD-10-CM

## 2022-05-24 DIAGNOSIS — I5032 Chronic diastolic (congestive) heart failure: Secondary | ICD-10-CM

## 2022-05-24 NOTE — Progress Notes (Signed)
Cardiology Office Note   Date:  05/24/2022   ID:  [name], DOB 05-21-1940, MRN [id number]  PCP:  [facility]  Cardiologist:  Adrian Blackwater, MD      History of Present Illness: [name] Browning is a 82 y.o. male who presents for  Chief Complaint  Patient presents with   Follow-up    4 week follow up    Patient in office for 1 month follow up. Occasional chest pain. Shortness of breath unchanged.   Chest Pain  [name]. The current episode started more than 1 month ago. The onset quality is sudden. The problem occurs intermittently. The problem has been waxing and waning. The pain is present in the epigastric region. The quality of the pain is described as stabbing. Risk factors include being elderly, lack of exercise, male gender, smoking/tobacco exposure and sedentary lifestyle.  His past medical history is significant for arrhythmia, CAD, COPD, CHF, hyperlipidemia, hypertension and MI.    Past Medical History:  Diagnosis Date   Anginal pain (HCC)    Anxiety    Aortic atherosclerosis (HCC)    Atrial fibrillation (HCC) 01/2019   Bilateral carpal tunnel syndrome    CAD (coronary artery disease)    a.) LHC 2004 --> normal coronaries. b.) normal stress test in 2007 and 2011; c.) Lexiscan 05/20/2014 --> LVEF 55-65%; no significant stress induced ischemia/arrythmia. d.) CT chest 03/25/2019 --> coronaries carcified.   Carotid atherosclerosis, bilateral    Carpal tunnel syndrome, bilateral    CHF (congestive heart failure) (HCC)    Chronic anticoagulation    a.) ASA + apixaban   COPD (chronic obstructive pulmonary disease) (HCC)    CVA (cerebral vascular accident) (HCC)    Degenerative disc disease, cervical    Diastolic dysfunction    a.) TTE 05/29/2014 --> LVEF 60-65%; G1DD.   DVT (deep venous thrombosis) (HCC)    GERD (gastroesophageal reflux disease)    History of 2019 novel coronavirus disease (COVID-19) 02/08/2019   HLD (hyperlipidemia)     Hypertension    Kidney stones    Osteoarthritis of right shoulder    Pneumonia    Respiratory failure, acute (HCC) 09/20/2020   a.) severe respiratory distress 1 hour after urological surgery. CXR (+) for acute pulmonary edema. Transferred to ICU and placed on NIPPV. Questionable aspiration PNA. (+) A.fib with RVR. Improved with ABX, diuresis, and amiodarone.   Skin cancer of face    a.) RIGHT ear and RIGHT forehead; excised.   Syncope    TIA (transient ischemic attack) 2016   Valvular regurgitation    a.) TTE 05/26/2014 --> LVEF 60-65%; trivial MR, mild TR; no AR or PR. b.) TTE 04/20/2015 --> LVEF 55-60%; trivial MR and PR; no AR or TR.     Past Surgical History:  Procedure Laterality Date   CARDIAC CATHETERIZATION  2004   CARPAL TUNNEL RELEASE Right 06/11/2013   CENTRAL LINE INSERTION  11/14/2020   Procedure: CENTRAL LINE INSERTION;  Surgeon: Marykay Lex, MD;  Location: [facility] INVASIVE CV LAB;  Service: Cardiovascular;;   CORONARY/GRAFT ACUTE MI REVASCULARIZATION N/A 11/14/2020   Procedure: Coronary/Graft Acute MI Revascularization;  Surgeon: Marykay Lex, MD;  Location: [facility] INVASIVE CV LAB;  Service: Cardiovascular;  Laterality: N/A;   CYSTOSCOPY W/ RETROGRADES  10/16/2020   Procedure: CYSTOSCOPY WITH RETROGRADE PYELOGRAM;  Surgeon: Sondra Come, MD;  Location: [facility] ORS;  Service: Urology;;   CYSTOSCOPY W/ URETERAL STENT PLACEMENT Left 09/20/2020  Procedure: CYSTOSCOPY WITH RETROGRADE PYELOGRAM/URETERAL STENT PLACEMENT;  Surgeon: Jannifer Hick, MD;  Location: [facility] ORS;  Service: Urology;  Laterality: Left;   CYSTOSCOPY/URETEROSCOPY/HOLMIUM LASER/STENT PLACEMENT Left 10/16/2020   Procedure: CYSTOSCOPY/URETEROSCOPY/HOLMIUM LASER/STENT PLACEMENT;  Surgeon: Sondra Come, MD;  Location: [facility] ORS;  Service: Urology;  Laterality: Left;   ESOPHAGOGASTRODUODENOSCOPY N/A 11/06/2020   Procedure: ESOPHAGOGASTRODUODENOSCOPY (EGD);  Surgeon: Midge Minium, MD;  Location: [facility] ENDOSCOPY;   Service: Endoscopy;  Laterality: N/A;   LEFT HEART CATH AND CORONARY ANGIOGRAPHY N/A 11/14/2020   Procedure: LEFT HEART CATH AND CORONARY ANGIOGRAPHY;  Surgeon: Marykay Lex, MD;  Location: [facility] INVASIVE CV LAB;  Service: Cardiovascular;  Laterality: N/A;   LEFT HEART CATH AND CORONARY ANGIOGRAPHY Left 01/28/2022   Procedure: LEFT HEART CATH AND CORONARY ANGIOGRAPHY;  Surgeon: Laurier Nancy, MD;  Location: [facility] INVASIVE CV LAB;  Service: Cardiovascular;  Laterality: Left;   SHOULDER ARTHROSCOPY WITH OPEN ROTATOR CUFF REPAIR Right 08/24/2017   Procedure: SHOULDER ARTHROSCOPY WITH OPEN ROTATOR CUFF REPAIR;  Surgeon: Christena Flake, MD;  Location: [facility] ORS;  Service: Orthopedics;  Laterality: Right;   SKIN CANCER EXCISION  12/01/2016   right ear    SKIN CANCER EXCISION     remove from the right side of the face    THROMBECTOMY Right 2004   leg   THYROIDECTOMY  1950   Not sure if total or partial thyroidectomy.     Current Outpatient Medications  Medication Sig Dispense Refill   acetaminophen (TYLENOL) 500 MG tablet Take 2 tablets (1,000 mg total) by mouth every 6 (six) hours as needed for mild pain.     aspirin EC 81 MG tablet Take 81 mg by mouth daily after supper.     atorvastatin (LIPITOR) 40 MG tablet Take 40 mg by mouth daily after supper.     cetirizine (ZYRTEC) 10 MG tablet Take 10 mg by mouth daily.     clopidogrel (PLAVIX) 75 MG tablet Take 1 tablet (75 mg total) by mouth daily. 90 tablet 3   famotidine (PEPCID) 20 MG tablet TAKE 1 TABLET BY MOUTH AFTER SUPPER 90 tablet 3   ferrous gluconate (FERGON) 240 (27 FE) MG tablet Take 240 mg by mouth 3 (three) times daily with meals.     isosorbide mononitrate (IMDUR) 30 MG 24 hr tablet TAKE 1 TABLET BY MOUTH EVERY DAY 30 tablet 2   KLOR-CON M20 20 MEQ tablet TAKE 1 TABLET BY MOUTH EVERY DAY 90 tablet 3   losartan (COZAAR) 50 MG tablet TAKE 1 TABLET BY MOUTH EVERY DAY 90 tablet 1   methenamine (HIPREX) 1 g tablet Take 1 tablet (1 g  total) by mouth 2 (two) times daily with a meal. 60 tablet 3   metoprolol succinate (TOPROL-XL) 50 MG 24 hr tablet TAKE 1 TABLET BY MOUTH EVERY DAY 90 tablet 1   montelukast (SINGULAIR) 10 MG tablet Take 10 mg by mouth at bedtime.     ondansetron (ZOFRAN-ODT) 4 MG disintegrating tablet Take 1 tablet (4 mg total) by mouth every 6 (six) hours as needed for nausea or vomiting. 20 tablet 0   pantoprazole (PROTONIX) 40 MG tablet TAKE 1 TABLET BY MOUTH TWICE A DAY 180 tablet 1   ranolazine (RANEXA) 1000 MG SR tablet Take 1 tablet (1,000 mg total) by mouth 2 (two) times daily. 60 tablet 3   roflumilast (DALIRESP) 500 MCG TABS tablet Take 500 mcg by mouth daily.     tamsulosin (FLOMAX) 0.4 MG CAPS capsule Take 1 capsule (  0.4 mg total) by mouth daily. 30 capsule 11   torsemide (DEMADEX) 20 MG tablet Take 3 tablets (60 mg total) by mouth daily. 270 tablet 3   No current facility-administered medications for this visit.   Facility-Administered Medications Ordered in Other Visits  Medication Dose Route Frequency Provider Last Rate Last Admin   sodium chloride flush (NS) 0.9 % injection 3 mL  3 mL Intravenous Q12H Scoggins, Amber, NP        Allergies:   Hydromorphone    Social History:   reports that he quit smoking about 19 years ago. His smoking use included cigarettes. He started smoking about 65 years ago. He has a 46.00 pack-year smoking history. He has been exposed to tobacco smoke. He quit smokeless tobacco use about 19 years ago.  His smokeless tobacco use included snuff. He reports that he does not currently use alcohol after a past usage of about 7.0 standard drinks of alcohol per week. He reports that he does not use drugs.   Family History:  family history includes CAD in his father; Heart attack in his father; Heart disease in his mother; Hypertension in his mother.    ROS:     Review of Systems  Constitutional: Negative.   HENT: Negative.    Eyes: Negative.   Respiratory: Negative.     Cardiovascular:  Positive for chest pain.  Gastrointestinal: Negative.   Genitourinary: Negative.   Musculoskeletal: Negative.   Skin: Negative.   Neurological: Negative.   Endo/Heme/Allergies: Negative.   Psychiatric/Behavioral: Negative.    All other systems reviewed and are negative.    All other systems are reviewed and negative.    PHYSICAL EXAM: VS:  BP 110/76   Pulse 73   Ht 6\' 2"  (1.88 m)   Wt 166 lb 6.4 oz (75.5 kg)   SpO2 92%   BMI 21.36 kg/m  , BMI Body mass index is 21.36 kg/m. Last weight:  Wt Readings from Last 3 Encounters:  05/24/22 166 lb 6.4 oz (75.5 kg)  05/06/22 167 lb (75.8 kg)  04/11/22 167 lb 9.6 oz (76 kg)     Physical Exam Vitals reviewed.  Constitutional:      Appearance: Normal appearance. He is normal weight.  HENT:     Head: Normocephalic.     Nose: Nose normal.     Mouth/Throat:     Mouth: Mucous membranes are moist.  Eyes:     Pupils: Pupils are equal, round, and reactive to light.  Cardiovascular:     Rate and Rhythm: Normal rate. Rhythm irregular.     Pulses: Normal pulses.     Heart sounds: Normal heart sounds.  Pulmonary:     Effort: Pulmonary effort is normal.  Abdominal:     General: Abdomen is flat. Bowel sounds are normal.  Musculoskeletal:        General: Normal range of motion.     Cervical back: Normal range of motion.  Skin:    General: Skin is warm.  Neurological:     General: No focal deficit present.     Mental Status: He is alert.  Psychiatric:        Mood and Affect: Mood normal.      EKG: none today   Recent Labs: 03/06/2022: Magnesium 2.1 05/06/2022: ALT 12; BUN 22; Creatinine, Ser 1.10; Hemoglobin 12.0; Platelets 194; Potassium 4.0; Sodium 139    Lipid Panel    Component Value Date/Time   CHOL 141 11/12/2020 0435   CHOL 175  06/01/2015 1049   TRIG 47 11/12/2020 0435   HDL 38 (L) 11/12/2020 0435   HDL 70 06/01/2015 1049   CHOLHDL 3.7 11/12/2020 0435   VLDL 9 11/12/2020 0435   LDLCALC 94  11/12/2020 0435   LDLCALC 105 (H) 12/07/2016 0958     ASSESSMENT AND PLAN:    ICD-10-CM   1. Chronic heart failure with preserved ejection fraction (HCC)  I50.32     2. AF (paroxysmal atrial fibrillation) (HCC)  I48.0     3. Coronary artery disease involving native coronary artery of native heart with unstable angina pectoris (HCC)  I25.110     4. Primary hypertension  I10     5. Mixed hyperlipidemia  E78.2        Problem List Items Addressed This Visit       Cardiovascular and Mediastinum   HTN (hypertension)    Well controlled today. Continue same medications.       AF (paroxysmal atrial fibrillation) (HCC)    In sinus rhythm on auscultation. Continue metoprolol.       Coronary artery disease    Patient reports occasional stabbing chest pain center of chest, happens less than twice a week. Improved on isosorbide and Ranexa.       (HFpEF) heart failure with preserved ejection fraction (HCC) - Primary    Patient feeling well. Continues to have shortness of breath with exertion, unchanged.         Other   Hyperlipidemia    On atorvastatin 40 mg daily.         Disposition:   Return in about 2 months (around 07/24/2022).    Total time spent: 30 minutes  Signed,  Adrian Blackwater, MD  05/24/2022 11:00 AM    [facility]

## 2022-05-24 NOTE — Assessment & Plan Note (Signed)
In sinus rhythm on auscultation. Continue metoprolol.

## 2022-05-24 NOTE — Assessment & Plan Note (Signed)
Patient feeling well. Continues to have shortness of breath with exertion, unchanged.

## 2022-05-24 NOTE — Assessment & Plan Note (Signed)
Patient reports occasional stabbing chest pain center of chest, happens less than twice a week. Improved on isosorbide and Ranexa.

## 2022-05-24 NOTE — Assessment & Plan Note (Signed)
Well controlled today. Continue same medications.  

## 2022-05-24 NOTE — Assessment & Plan Note (Signed)
On atorvastatin 40 mg daily.

## 2022-06-09 NOTE — Progress Notes (Signed)
[facility] Customer service manager [facility]) Quality Pharmacy Team [facility]   06/09/2022  ANTONINE RATTERREE [birth year] [id number]  Reason for referral: Medication Assistance  Referral source:  [facility] Population Health Care Guide Current insurance: [facility]  Reason for call: Medication Assistance  Outreach:  Successful telephone call with patient. I was able to speak to Mr. Caple today in regards to his referral for medication assistance. Upon review of his medication list, the patient is currently filling generic medications which do not qualify for patient assistance programs.   I asked the patient if he had any financial barriers with any of his medications, or any other medication related issues and he reported none.  Plan:  I will close this patient case as he reports no medication needs at this time.   Thank you for allowing [facility] pharmacy to be a part of this patient's care.   Harlon Flor, PharmD Clinical Pharmacist  [facility] [employer] [phone]

## 2022-06-20 NOTE — Progress Notes (Unsigned)
06/21/2022 3:02 PM   [name] Browning 1940-06-19 [id number]  Referring provider: [facility] [address] [place],  [place] [address]  Urological history: 1. Nephrolithiasis -left URS (09/2020)  -CT (02/2022) - 1 mm right upper pole stone, 10 mm left lower pole stone w/ 2 mm and 3 mm left renal stones   2. rUTI's -contributing factors of age, constipation and inadequate bladder emptying -documented urine cultures over the last year  05/06/2022 ESBL E.coli  12/29/2021 MDRO E.coli  11/24/2021 MDRO E.coli  11/02/2021 MDRO E.coli  09/30/2021 MDRO E.coli  08/06/2021 ESBL E.coli  07/12/2021 MDRO E.coli -methenamine 1 gram twice daily   3. BPH with LU TS -tamsulosin 0.4 mg daily   Chief Complaint  Patient presents with   Recurrent UTI   HPI: [name] Browning is a 82 y.o. male who presents today for testicular pain.  Previous records reviewed.   He continues to battle with scrotal pain, urgency and dysuria.  He has been on numerous antibiotics and even underwent instillation into the bladder with gentamicin without relief of his symptoms.  Patient denies any modifying or aggravating factors.  Patient denies any gross hematuria or flank pain.  Patient denies any fevers, chills, nausea or vomiting.    UA yellow slightly cloudy, specific 81.015, trace blood, pH 6.5, 2+ leukocyte, greater than 30 WBCs, 0-2 RBCs, 0-2 epithelial cells and many bacteria.  PVR 92 mL   PMH: Past Medical History:  Diagnosis Date   Anginal pain (HCC)    Anxiety    Aortic atherosclerosis (HCC)    Atrial fibrillation (HCC) 01/2019   Bilateral carpal tunnel syndrome    CAD (coronary artery disease)    a.) LHC 2004 --> normal coronaries. b.) normal stress test in 2007 and 2011; c.) Lexiscan 05/20/2014 --> LVEF 55-65%; no significant stress induced ischemia/arrythmia. d.) CT chest 03/25/2019 --> coronaries carcified.   Carotid atherosclerosis, bilateral    Carpal tunnel syndrome, bilateral    CHF  (congestive heart failure) (HCC)    Chronic anticoagulation    a.) ASA + apixaban   COPD (chronic obstructive pulmonary disease) (HCC)    CVA (cerebral vascular accident) (HCC)    Degenerative disc disease, cervical    Diastolic dysfunction    a.) TTE 05/29/2014 --> LVEF 60-65%; G1DD.   DVT (deep venous thrombosis) (HCC)    GERD (gastroesophageal reflux disease)    History of 2019 novel coronavirus disease (COVID-19) 02/08/2019   HLD (hyperlipidemia)    Hypertension    Kidney stones    Osteoarthritis of right shoulder    Pneumonia    Respiratory failure, acute (HCC) 09/20/2020   a.) severe respiratory distress 1 hour after urological surgery. CXR (+) for acute pulmonary edema. Transferred to ICU and placed on NIPPV. Questionable aspiration PNA. (+) A.fib with RVR. Improved with ABX, diuresis, and amiodarone.   Skin cancer of face    a.) RIGHT ear and RIGHT forehead; excised.   Syncope    TIA (transient ischemic attack) 2016   Valvular regurgitation    a.) TTE 05/26/2014 --> LVEF 60-65%; trivial MR, mild TR; no AR or PR. b.) TTE 04/20/2015 --> LVEF 55-60%; trivial MR and PR; no AR or TR.    Surgical History: Past Surgical History:  Procedure Laterality Date   CARDIAC CATHETERIZATION  2004   CARPAL TUNNEL RELEASE Right 06/11/2013   CENTRAL LINE INSERTION  11/14/2020   Procedure: CENTRAL LINE INSERTION;  Surgeon: Marykay Lex, MD;  Location: [facility] INVASIVE  CV LAB;  Service: Cardiovascular;;   CORONARY/GRAFT ACUTE MI REVASCULARIZATION N/A 11/14/2020   Procedure: Coronary/Graft Acute MI Revascularization;  Surgeon: Marykay Lex, MD;  Location: [facility] INVASIVE CV LAB;  Service: Cardiovascular;  Laterality: N/A;   CYSTOSCOPY W/ RETROGRADES  10/16/2020   Procedure: CYSTOSCOPY WITH RETROGRADE PYELOGRAM;  Surgeon: Sondra Come, MD;  Location: [facility] ORS;  Service: Urology;;   CYSTOSCOPY W/ URETERAL STENT PLACEMENT Left 09/20/2020   Procedure: CYSTOSCOPY WITH RETROGRADE  PYELOGRAM/URETERAL STENT PLACEMENT;  Surgeon: Jannifer Hick, MD;  Location: [facility] ORS;  Service: Urology;  Laterality: Left;   CYSTOSCOPY/URETEROSCOPY/HOLMIUM LASER/STENT PLACEMENT Left 10/16/2020   Procedure: CYSTOSCOPY/URETEROSCOPY/HOLMIUM LASER/STENT PLACEMENT;  Surgeon: Sondra Come, MD;  Location: [facility] ORS;  Service: Urology;  Laterality: Left;   ESOPHAGOGASTRODUODENOSCOPY N/A 11/06/2020   Procedure: ESOPHAGOGASTRODUODENOSCOPY (EGD);  Surgeon: Midge Minium, MD;  Location: [facility] ENDOSCOPY;  Service: Endoscopy;  Laterality: N/A;   LEFT HEART CATH AND CORONARY ANGIOGRAPHY N/A 11/14/2020   Procedure: LEFT HEART CATH AND CORONARY ANGIOGRAPHY;  Surgeon: Marykay Lex, MD;  Location: [facility] INVASIVE CV LAB;  Service: Cardiovascular;  Laterality: N/A;   LEFT HEART CATH AND CORONARY ANGIOGRAPHY Left 01/28/2022   Procedure: LEFT HEART CATH AND CORONARY ANGIOGRAPHY;  Surgeon: Laurier Nancy, MD;  Location: [facility] INVASIVE CV LAB;  Service: Cardiovascular;  Laterality: Left;   SHOULDER ARTHROSCOPY WITH OPEN ROTATOR CUFF REPAIR Right 08/24/2017   Procedure: SHOULDER ARTHROSCOPY WITH OPEN ROTATOR CUFF REPAIR;  Surgeon: Christena Flake, MD;  Location: [facility] ORS;  Service: Orthopedics;  Laterality: Right;   SKIN CANCER EXCISION  12/01/2016   right ear    SKIN CANCER EXCISION     remove from the right side of the face    THROMBECTOMY Right 2004   leg   THYROIDECTOMY  1950   Not sure if total or partial thyroidectomy.    Home Medications:  Allergies as of 06/21/2022       Reactions   Hydromorphone    Hallucination Pt reports he doesn't know about this        Medication List        Accurate as of June 21, 2022 11:59 PM. If you have any questions, ask your nurse or doctor.          acetaminophen 500 MG tablet Commonly known as: TYLENOL Take 2 tablets (1,000 mg total) by mouth every 6 (six) hours as needed for mild pain.   aspirin EC 81 MG tablet Take 81 mg by mouth daily after supper.    atorvastatin 40 MG tablet Commonly known as: LIPITOR Take 40 mg by mouth daily after supper.   cetirizine 10 MG tablet Commonly known as: ZYRTEC Take 10 mg by mouth daily.   clopidogrel 75 MG tablet Commonly known as: PLAVIX Take 1 tablet (75 mg total) by mouth daily.   famotidine 20 MG tablet Commonly known as: PEPCID TAKE 1 TABLET BY MOUTH AFTER SUPPER   ferrous gluconate 240 (27 FE) MG tablet Commonly known as: FERGON Take 240 mg by mouth 3 (three) times daily with meals.   isosorbide mononitrate 30 MG 24 hr tablet Commonly known as: IMDUR TAKE 1 TABLET BY MOUTH EVERY DAY   Klor-Con M20 20 MEQ tablet Generic drug: potassium chloride SA TAKE 1 TABLET BY MOUTH EVERY DAY   losartan 50 MG tablet Commonly known as: COZAAR TAKE 1 TABLET BY MOUTH EVERY DAY   methenamine 1 g tablet Commonly known as: Hiprex Take 1 tablet (1 g total) by mouth  2 (two) times daily with a meal.   metoprolol succinate 50 MG 24 hr tablet Commonly known as: TOPROL-XL TAKE 1 TABLET BY MOUTH EVERY DAY   montelukast 10 MG tablet Commonly known as: SINGULAIR Take 10 mg by mouth at bedtime.   nitrofurantoin (macrocrystal-monohydrate) 100 MG capsule Commonly known as: MACROBID Take 1 capsule (100 mg total) by mouth every 12 (twelve) hours. Started by: Michiel Cowboy, PA-C   ondansetron 4 MG disintegrating tablet Commonly known as: ZOFRAN-ODT Take 1 tablet (4 mg total) by mouth every 6 (six) hours as needed for nausea or vomiting.   pantoprazole 40 MG tablet Commonly known as: PROTONIX TAKE 1 TABLET BY MOUTH TWICE A DAY   ranolazine 1000 MG SR tablet Commonly known as: Ranexa Take 1 tablet (1,000 mg total) by mouth 2 (two) times daily.   roflumilast 500 MCG Tabs tablet Commonly known as: DALIRESP Take 500 mcg by mouth daily.   tamsulosin 0.4 MG Caps capsule Commonly known as: FLOMAX Take 1 capsule (0.4 mg total) by mouth daily.   torsemide 20 MG tablet Commonly known as:  DEMADEX Take 3 tablets (60 mg total) by mouth daily.        Allergies:  Allergies  Allergen Reactions   Hydromorphone     Hallucination Pt reports he doesn't know about this    Family History: Family History  Problem Relation Age of Onset   Hypertension Mother    Heart disease Mother    CAD Father    Heart attack Father     Social History:  reports that he quit smoking about 19 years ago. His smoking use included cigarettes. He started smoking about 65 years ago. He has a 46.00 pack-year smoking history. He has been exposed to tobacco smoke. He quit smokeless tobacco use about 19 years ago.  His smokeless tobacco use included snuff. He reports that he does not currently use alcohol after a past usage of about 7.0 standard drinks of alcohol per week. He reports that he does not use drugs.  ROS: Pertinent ROS in HPI  Physical Exam: BP 100/62   Pulse 64   Ht 6\' 2"  (1.88 m)   Wt 163 lb (73.9 kg)   BMI 20.93 kg/m   Constitutional:  Well nourished. Alert and oriented, No acute distress. HEENT: [place] AT, moist mucus membranes.  Trachea midline Cardiovascular: No clubbing, cyanosis, or edema. Respiratory: Normal respiratory effort, no increased work of breathing. GU: No CVA tenderness.  No bladder fullness or masses.  Patient with uncircumcised phallus. Foreskin easily retracted.  Urethral meatus is patent.  No penile discharge. No penile lesions or rashes. Scrotum without lesions, cysts, rashes and/or edema.  Testicles are located scrotally bilaterally. They are atrophic.  No masses are appreciated in the testicles. Left and right epididymis are normal. Neurologic: Grossly intact, no focal deficits, moving all 4 extremities. Psychiatric: Normal mood and affect.  Laboratory Data: Lab Results  Component Value Date   WBC 8.9 05/06/2022   HGB 12.0 (L) 05/06/2022   HCT 38.3 (L) 05/06/2022   MCV 91.4 05/06/2022   PLT 194 05/06/2022    Lab Results  Component Value Date    CREATININE 1.10 05/06/2022    Lab Results  Component Value Date   PSA 2.8 02/29/2016    Lab Results  Component Value Date   AST 26 05/06/2022   Lab Results  Component Value Date   ALT 12 05/06/2022    Urinalysis See EPIC and HPI I have reviewed the  labs.   Pertinent Imaging:  06/21/22 13:36  Scan Result 92    Assessment & Plan:    1. Testicular pain -UA with pyuria and bacteriuria -urine culture pending -likely referred pain  2. BPH with LU TS -tamsulosin 0.4 mg daily  3. rUTI's -UA with pyuria and bacteriuria -urine culture pending -given 80 mg Gentamicin in the office -start Macrobid 100 mg BI x 7 days -hold Hiprex while on antibiotics  Return for follow up pending urine culture results .  These notes generated with voice recognition software. I apologize for typographical errors.  Cloretta Ned  [facility] [address]  [address] [place], [place] [address] [phone]

## 2022-06-21 ENCOUNTER — Ambulatory Visit (INDEPENDENT_AMBULATORY_CARE_PROVIDER_SITE_OTHER): Payer: Medicare HMO | Admitting: Urology

## 2022-06-21 ENCOUNTER — Encounter: Payer: Self-pay | Admitting: Urology

## 2022-06-21 VITALS — BP 100/62 | HR 64 | Ht 74.0 in | Wt 163.0 lb

## 2022-06-21 DIAGNOSIS — N390 Urinary tract infection, site not specified: Secondary | ICD-10-CM | POA: Diagnosis not present

## 2022-06-21 DIAGNOSIS — N50812 Left testicular pain: Secondary | ICD-10-CM | POA: Diagnosis not present

## 2022-06-21 DIAGNOSIS — R8271 Bacteriuria: Secondary | ICD-10-CM

## 2022-06-21 DIAGNOSIS — R8281 Pyuria: Secondary | ICD-10-CM

## 2022-06-21 DIAGNOSIS — N138 Other obstructive and reflux uropathy: Secondary | ICD-10-CM | POA: Diagnosis not present

## 2022-06-21 DIAGNOSIS — N50811 Right testicular pain: Secondary | ICD-10-CM

## 2022-06-21 DIAGNOSIS — N401 Enlarged prostate with lower urinary tract symptoms: Secondary | ICD-10-CM

## 2022-06-21 LAB — BLADDER SCAN AMB NON-IMAGING: Scan Result: 92

## 2022-06-21 LAB — URINALYSIS, COMPLETE
Bilirubin, UA: NEGATIVE
Glucose, UA: NEGATIVE
Ketones, UA: NEGATIVE
Nitrite, UA: NEGATIVE
Protein,UA: NEGATIVE
Specific Gravity, UA: 1.015 (ref 1.005–1.030)
Urobilinogen, Ur: 0.2 mg/dL (ref 0.2–1.0)
pH, UA: 6.5 (ref 5.0–7.5)

## 2022-06-21 LAB — MICROSCOPIC EXAMINATION: WBC, UA: >30 /hpf — AB (ref 0–5)

## 2022-06-21 MED ORDER — GENTAMICIN SULFATE 40 MG/ML IJ SOLN
80.0000 mg | Freq: Once | INTRAMUSCULAR | Status: AC
Start: 2022-06-21 — End: 2022-06-21
  Administered 2022-06-21: 80 mg via INTRAMUSCULAR

## 2022-06-21 MED ORDER — NITROFURANTOIN MONOHYD MACRO 100 MG PO CAPS
100.0000 mg | ORAL_CAPSULE | Freq: Two times a day (BID) | ORAL | 0 refills | Status: DC
Start: 2022-06-21 — End: 2022-11-30

## 2022-06-22 ENCOUNTER — Ambulatory Visit: Payer: Self-pay

## 2022-06-22 NOTE — Patient Outreach (Signed)
  Care Coordination   Follow Up Visit Note   06/22/2022 Name: [name] Browning MRN: [id number] DOB: 28-Nov-1940  [name] Browning is a 82 y.o. year old male who sees [facility] for primary care. I spoke with  [name] Browning by phone today.  What matters to the patients health and wellness today?  Patient reports having follow up with urologist on yesterday 06/21/22.  He states he was prescribed a new antibiotic and given an antibiotic shot.  Confirmed he started taking antibiotic on yesterday.  Still reports ongoing symptoms of frequency and discomfort. Patient reports having CP at least 3-4 times per week. Reports most recent CP being yesterday and this morning. Reports CP is relieved with nitroglycerin.  Patient denies any increase in HF symptoms. He reports today's weight is 163 lbs. He states his weight ranges from 163 - 166 lbs.    Goals Addressed             This Visit's Progress    Education / management of chronic health conditions       Interventions Today    Flowsheet Row Most Recent Value  Chronic Disease   Chronic disease during today's visit Congestive Heart Failure (CHF), Other  [UTI's]  General Interventions   General Interventions Discussed/Reviewed General Interventions Reviewed, Doctor Visits  [evaluation of current treatment plan for HF/ UTI and patients adherence to plan as established by provider. Assessed for HF symptoms, chest pain and ongoing UTI symptoms. Assessed for frequency of CP weekly.]  Doctor Visits Discussed/Reviewed Doctor Visits Reviewed  [facility] urology appointment visit from 06/21/22 and reviewed most recent PCP visit.  Reviewed upcoming provider appointments.]  Education Interventions   Education Provided --  [Reviewed heart failure symptoms.  Advised to notify provider for increase in HF symptoms. Advised to call 911 for severe chest pain unrelieved by nitroglycerin. Advised to drink plenty of water. Discussed cranberry juice could help with  symptoms.]  Pharmacy Interventions   Pharmacy Dicussed/Reviewed Pharmacy Topics Reviewed  [medications reviewed. confirmed patient started new antibiotic  prescribed by urologist on 06/21/22.  Encouraged compliance with medications.]              SDOH assessments and interventions completed:  No     Care Coordination Interventions:  Yes, provided   Follow up plan: Follow up call scheduled for 07/22/22     Encounter Outcome:  Pt. Visit Completed   George Ina RN,BSN,CCM [facility] Care Coordination [phone] direct line

## 2022-06-22 NOTE — Patient Instructions (Signed)
Visit Information  Thank you for taking time to visit with me today. Please don't hesitate to contact me if I can be of assistance to you.   Following are the goals we discussed today:  Take antibiotic until completed Drink plenty of water and consider cranberry juice option Notify doctor for increase in HF symptoms Call 911 or severe chest pain not relieved with nitroglycerin Continue to weigh daily and record.   Our next appointment is by telephone on 07/22/22 at 1:30 pm  Please call the care guide team at [phone] if you need to cancel or reschedule your appointment.   If you are experiencing a Mental Health or Behavioral Health Crisis or need someone to talk to, please call the Suicide and Crisis Lifeline: 988 call 1-800-273-TALK (toll free, 24 hour hotline)  Patient verbalizes understanding of instructions and care plan provided today and agrees to view in MyChart. Active MyChart status and patient understanding of how to access instructions and care plan via MyChart confirmed with patient.     George Ina RN,BSN,CCM [facility] Care Coordination [phone] direct line

## 2022-06-26 LAB — CULTURE, URINE COMPREHENSIVE

## 2022-06-29 DIAGNOSIS — H35033 Hypertensive retinopathy, bilateral: Secondary | ICD-10-CM | POA: Diagnosis not present

## 2022-06-29 DIAGNOSIS — H25813 Combined forms of age-related cataract, bilateral: Secondary | ICD-10-CM | POA: Diagnosis not present

## 2022-06-29 DIAGNOSIS — H5203 Hypermetropia, bilateral: Secondary | ICD-10-CM | POA: Diagnosis not present

## 2022-07-03 ENCOUNTER — Emergency Department
Admission: EM | Admit: 2022-07-03 | Discharge: 2022-07-03 | Disposition: A | Discharge status: Home / Self Care | Payer: Medicare HMO | Attending: Student in an Organized Health Care Education/Training Program | Admitting: Student in an Organized Health Care Education/Training Program

## 2022-07-03 ENCOUNTER — Emergency Department: Payer: Medicare HMO

## 2022-07-03 ENCOUNTER — Other Ambulatory Visit: Payer: Self-pay

## 2022-07-03 DIAGNOSIS — R0789 Other chest pain: Secondary | ICD-10-CM | POA: Diagnosis not present

## 2022-07-03 DIAGNOSIS — R0602 Shortness of breath: Secondary | ICD-10-CM | POA: Diagnosis not present

## 2022-07-03 DIAGNOSIS — J439 Emphysema, unspecified: Secondary | ICD-10-CM | POA: Diagnosis not present

## 2022-07-03 DIAGNOSIS — I509 Heart failure, unspecified: Secondary | ICD-10-CM | POA: Diagnosis not present

## 2022-07-03 DIAGNOSIS — I700 Atherosclerosis of aorta: Secondary | ICD-10-CM | POA: Diagnosis not present

## 2022-07-03 DIAGNOSIS — J449 Chronic obstructive pulmonary disease, unspecified: Secondary | ICD-10-CM | POA: Insufficient documentation

## 2022-07-03 DIAGNOSIS — R079 Chest pain, unspecified: Secondary | ICD-10-CM | POA: Diagnosis not present

## 2022-07-03 DIAGNOSIS — I2510 Atherosclerotic heart disease of native coronary artery without angina pectoris: Secondary | ICD-10-CM | POA: Insufficient documentation

## 2022-07-03 LAB — BASIC METABOLIC PANEL
Anion gap: 7 (ref 5–15)
BUN: 23 mg/dL (ref 8–23)
CO2: 32 mmol/L (ref 22–32)
Calcium: 9.6 mg/dL (ref 8.9–10.3)
Chloride: 104 mmol/L (ref 98–111)
Creatinine, Ser: 1.19 mg/dL (ref 0.61–1.24)
GFR, Estimated: >60 mL/min (ref >60)
Glucose, Bld: 103 mg/dL — ABNORMAL HIGH (ref 70–99)
Potassium: 4.1 mmol/L (ref 3.5–5.1)
Sodium: 143 mmol/L (ref 135–145)

## 2022-07-03 LAB — CBC
HCT: 43.4 % (ref 39.0–52.0)
Hemoglobin: 13.8 g/dL (ref 13.0–17.0)
MCH: 29.1 pg (ref 26.0–34.0)
MCHC: 31.8 g/dL (ref 30.0–36.0)
MCV: 91.4 fL (ref 80.0–100.0)
Platelets: 204 10*3/uL (ref 150–400)
RBC: 4.75 MIL/uL (ref 4.22–5.81)
RDW: 14.1 % (ref 11.5–15.5)
WBC: 6.9 10*3/uL (ref 4.0–10.5)
nRBC: 0 % (ref 0.0–0.2)

## 2022-07-03 LAB — TROPONIN I (HIGH SENSITIVITY)
Troponin I (High Sensitivity): 29 ng/L — ABNORMAL HIGH (ref <18)
Troponin I (High Sensitivity): 26 ng/L — ABNORMAL HIGH (ref <18)

## 2022-07-03 LAB — D-DIMER, QUANTITATIVE: D-Dimer, Quant: 0.54 ug/mL-FEU — ABNORMAL HIGH (ref 0.00–0.50)

## 2022-07-03 MED ORDER — IPRATROPIUM-ALBUTEROL 0.5-2.5 (3) MG/3ML IN SOLN
3.0000 mL | Freq: Once | RESPIRATORY_TRACT | Status: AC
  Administered 2022-07-03: 3 mL via RESPIRATORY_TRACT
  Filled 2022-07-03: qty 3

## 2022-07-03 MED ORDER — IOHEXOL 350 MG/ML SOLN
75.0000 mL | Freq: Once | INTRAVENOUS | Status: AC | PRN
  Administered 2022-07-03: 75 mL via INTRAVENOUS

## 2022-07-03 NOTE — ED Provider Notes (Signed)
[facility] Provider Note    Event Date/Time   First MD Initiated Contact with Patient 07/03/22 1429     (approximate)   History   Chest Pain   HPI  [name] Browning is a 82 y.o. male extensive history of CAD CHF, COPD not on anticoagulation presents to the ER for evaluation chest pain and shortness of breath.  Feels like symptoms started getting worse after the change in weather when it started getting very hot recently.  Has not noted any diaphoresis but has had worsening exertional dyspnea.  Denies any abdominal pain.  No fevers.  Does have some pain with deep inspiration.     Physical Exam   Triage Vital Signs: ED Triage Vitals  Enc Vitals Group     BP 07/03/22 1416 139/61     Pulse Rate 07/03/22 1416 (!) 52     Resp 07/03/22 1416 20     Temp 07/03/22 1416 (!) 97.5 F (36.4 C)     Temp Source 07/03/22 1416 Oral     SpO2 07/03/22 1416 99 %     Weight 07/03/22 1417 162 lb (73.5 kg)     Height 07/03/22 1417 6\' 2"  (1.88 m)     Head Circumference --      Peak Flow --      Pain Score 07/03/22 1416 6     Pain Loc --      Pain Edu? --      Excl. in GC? --     Most recent vital signs: Vitals:   07/03/22 1416  BP: 139/61  Pulse: (!) 52  Resp: 20  Temp: (!) 97.5 F (36.4 C)  SpO2: 99%     Constitutional: Alert  Eyes: Conjunctivae are normal.  Head: Atraumatic. Nose: No congestion/rhinnorhea. Mouth/Throat: Mucous membranes are moist.   Neck: Painless ROM.  Cardiovascular:   Good peripheral circulation. Respiratory: Normal respiratory effort.  Coarse breath sounds throughout. Gastrointestinal: Soft and nontender.  Musculoskeletal:  no deformity Neurologic:  MAE spontaneously. No gross focal neurologic deficits are appreciated.  Skin:  Skin is warm, dry and intact. No rash noted. Psychiatric: Mood and affect are normal. Speech and behavior are normal.    ED Results / Procedures / Treatments   Labs (all labs ordered are listed, but  only abnormal results are displayed) Labs Reviewed  CBC  BASIC METABOLIC PANEL  D-DIMER, QUANTITATIVE  TROPONIN I (HIGH SENSITIVITY)     EKG  ED ECG REPORT I, Willy Eddy, the attending physician, personally viewed and interpreted this ECG.   Date: 07/03/2022  EKG Time: 14:13  Rate: 55  Rhythm: afib   Axis: normal  Intervals: normal  ST&T Change: no stemi, no depressions    RADIOLOGY Please see ED Course for my review and interpretation.  I personally reviewed all radiographic images ordered to evaluate for the above acute complaints and reviewed radiology reports and findings.  These findings were personally discussed with the patient.  Please see medical record for radiology report.    PROCEDURES:  Critical Care performed: No  Procedures   MEDICATIONS ORDERED IN ED: Medications  ipratropium-albuterol (DUONEB) 0.5-2.5 (3) MG/3ML nebulizer solution 3 mL (3 mLs Nebulization Given 07/03/22 1450)     IMPRESSION / MDM / ASSESSMENT AND PLAN / ED COURSE  I reviewed the triage vital signs and the nursing notes.  Differential diagnosis includes, but is not limited to, ACS, pericarditis, esophagitis, boerhaaves, pe, dissection, pna, bronchitis, costochondritis  Patient presenting to the ER for evaluation of symptoms as described above.  Based on symptoms, risk factors and considered above differential, this presenting complaint could reflect a potentially life-threatening illness therefore the patient will be placed on continuous pulse oximetry and telemetry for monitoring.  Laboratory evaluation will be sent to evaluate for the above complaints.  EKG with A-fib.  No ischemic changes.  Cardiac enzymes D-dimer still pending.  Chest x-ray my review and interpretation does not show any evidence of acute edema or infiltrates.  Patient will be signed out to oncoming physician pending further workup and monitoring.       FINAL CLINICAL  IMPRESSION(S) / ED DIAGNOSES   Final diagnoses:  Atypical chest pain     Rx / DC Orders   ED Discharge Orders     None        Note:  This document was prepared using Dragon voice recognition software and may include unintentional dictation errors.    Willy Eddy, MD 07/03/22 [phone]

## 2022-07-03 NOTE — ED Triage Notes (Signed)
Pt states coming in for chest pain for 2 days, but has been getting worse. Pt states some shortness of breath Pt states previous MI with two surgeries.

## 2022-07-03 NOTE — ED Notes (Signed)
Daughter, [phone]

## 2022-07-06 ENCOUNTER — Telehealth: Payer: Self-pay | Admitting: Urology

## 2022-07-06 NOTE — Telephone Encounter (Signed)
Pt stopped by office to let us know he isn't any better after taking 14 days of abx.  He is still having burning w/urination.  You don't have any openings in [place] until 7/24.  He would like to be seen ASAP.  I advised pt to either contact pcp or go to a walk in clinic.

## 2022-07-08 ENCOUNTER — Telehealth: Payer: Self-pay

## 2022-07-08 NOTE — Telephone Encounter (Signed)
Transition Care Management Unsuccessful Follow-up Telephone Call  Date of discharge and from where:  [place] 6/23  Attempts:  1st Attempt  Reason for unsuccessful TCM follow-up call:  No answer/busy   Lenard Forth [facility] Guide, [facility] [phone] [address] [place], [place], [place] [address] Phone: [phone] Email: [email or web address]

## 2022-07-09 ENCOUNTER — Other Ambulatory Visit: Payer: Self-pay | Admitting: Family

## 2022-07-10 ENCOUNTER — Other Ambulatory Visit: Payer: Self-pay | Admitting: Cardiovascular Disease

## 2022-07-10 DIAGNOSIS — I2510 Atherosclerotic heart disease of native coronary artery without angina pectoris: Secondary | ICD-10-CM

## 2022-07-10 DIAGNOSIS — R079 Chest pain, unspecified: Secondary | ICD-10-CM

## 2022-07-11 ENCOUNTER — Telehealth: Payer: Self-pay

## 2022-07-11 NOTE — Telephone Encounter (Signed)
Transition Care Management Follow-up Telephone Call Date of discharge and from where: [place]  6/23 How have you been since you were released from the hospital? Still in pain but can't get an appointment Any questions or concerns? Yes  Items Reviewed: Did the pt receive and understand the discharge instructions provided? Yes  Medications obtained and verified? Yes  Other? No  Any new allergies since your discharge? No  Dietary orders reviewed? No Do you have support at home? Yes     Follow up appointments reviewed:  PCP Hospital f/u appt confirmed?  no appointments available. Specialist Hospital f/u appt confirmed? No  Scheduled to see  on  @ . Are transportation arrangements needed? No  If their condition worsens, is the pt aware to call PCP or go to the Emergency Dept.? Yes Was the patient provided with contact information for the PCP's office or ED? Yes Was to pt encouraged to call back with questions or concerns? Yes

## 2022-07-12 NOTE — Telephone Encounter (Signed)
Spoke with patient and he can not come 3 days a week, he also can't come any Wednesdays. Pt wanted to schedule 08/04/22 @ 3pm for bladder solution and to discuss with shannon.

## 2022-07-19 ENCOUNTER — Ambulatory Visit: Payer: Medicare HMO | Admitting: Family

## 2022-07-19 ENCOUNTER — Telehealth: Payer: Self-pay | Admitting: Family

## 2022-07-19 NOTE — Telephone Encounter (Signed)
Patient did not show for his Heart Failure Clinic appointment on 07/19/22.

## 2022-07-19 NOTE — Progress Notes (Deleted)
PCP: Primary Cardiologist:  HPI:  [name] is a 82 y/o male with a history of carotid disease, CAD, hyperlipidemia, HTN, CKD, stroke, anxiety, atrial fibrillation, COPD, DVT, GERD, previous tobacco use and chronic heart failure.   Echo report from 03/17/2021 reviewed and showed an EF of 60-65%. Echo report from 11/15/20 reviewed and showed an EF of 60-65% along with severe LAE and mild MR.   LHC done 11/14/20 showed: Ost RCA lesion is 95% stenosed.   Prox RCA to Dist RCA lesion is 100% stenosed. -Very minimal flow.  After initial angiography, it disappeared to progressed to the ostium.   Left-to-right collaterals via septal perforators fill the PDA system.   Mid LM to Dist LM lesion is 20% stenosed.   Ost LAD to Prox LAD lesion is 25% stenosed.  Relatively small caliber vessel that wraps the apex.   Diminutive LCx with moderate sized OM1, mild disease.   The left ventricular systolic function is normal. No obvious regional wall motion normalities.   The left ventricular ejection fraction is 55-65% by visual estimate.   LV end diastolic pressure is moderately elevated.  18 mmHg   There is no aortic valve stenosis.  Was in the ED 04/20/21 due to acute on chronic HF. IV lasix given with improvement of symptoms and he was released. Admitted 3/17-3/20/23 for COPD exacerbation and PNA, eventually discharged home. In the ED overnight 3/15-3/16/23 for CAP, given abx and discharged home. Admitted 3/4-3/11/23 with acute on chronic respiratory failure, COPD and HF exacerbation, discharged home.   He presents today for a follow-up visit with a chief complaint of moderate shortness of breath with very little exertion. Describes this as chronic in nature. He has associated decreased appetite, fatigue, cough, palpitations, occasional dizziness, dysuria, urinary burning, weakness, chronic pain and difficulty sleeping along with this. He denies any abdominal distention, pedal edema, chest pain, wheezing or weight  gain.   Biggest complaint he has is of his chronic UTI symptoms that can't get resolved. Recently had a pelvic CT and is currently scheduled for a cystoscopy.   Brought a medication list but has 2 different ones and he can't clarify because his granddaughter fixes his pill box for him.      ROS: All systems negative except as listed in HPI, PMH and Problem List.  SH:  Social History   Socioeconomic History   Marital status: Legally Separated    Spouse name: Not on file   Number of children: 1   Years of education: Not on file   Highest education level: Not on file  Occupational History   Not on file  Tobacco Use   Smoking status: Former    Packs/day: 1.00    Years: 46.00    Additional pack years: 0.00    Total pack years: 46.00    Types: Cigarettes    Start date: 01/10/1957    Quit date: 02/11/2003    Years since quitting: 19.4    Passive exposure: Past   Smokeless tobacco: Former    Types: Snuff    Quit date: 02/11/2003  Vaping Use   Vaping Use: Never used  Substance and Sexual Activity   Alcohol use: Not Currently    Alcohol/week: 7.0 standard drinks of alcohol    Types: 7 Cans of beer per week   Drug use: No   Sexual activity: Yes    Partners: Female  Other Topics Concern   Not on file  Social History Narrative   Live with grandaughter,  [name] Browning. 1 son that lives in [place].   Social Determinants of Health   Financial Resource Strain: Not on file  Food Insecurity: No Food Insecurity (05/20/2022)   Hunger Vital Sign    Worried About Running Out of Food in the Last Year: Never true    Ran Out of Food in the Last Year: Never true  Transportation Needs: No Transportation Needs (05/20/2022)   PRAPARE - Administrator, Civil Service (Medical): No    Lack of Transportation (Non-Medical): No  Physical Activity: Not on file  Stress: Not on file  Social Connections: Not on file  Intimate Partner Violence: Not At Risk (03/05/2022)   Humiliation, Afraid, Rape,  and Kick questionnaire    Fear of Current or Ex-Partner: No    Emotionally Abused: No    Physically Abused: No    Sexually Abused: No    FH:  Family History  Problem Relation Age of Onset   Hypertension Mother    Heart disease Mother    CAD Father    Heart attack Father     Past Medical History:  Diagnosis Date   Anginal pain (HCC)    Anxiety    Aortic atherosclerosis (HCC)    Atrial fibrillation (HCC) 01/2019   Bilateral carpal tunnel syndrome    CAD (coronary artery disease)    a.) LHC 2004 --> normal coronaries. b.) normal stress test in 2007 and 2011; c.) Lexiscan 05/20/2014 --> LVEF 55-65%; no significant stress induced ischemia/arrythmia. d.) CT chest 03/25/2019 --> coronaries carcified.   Carotid atherosclerosis, bilateral    Carpal tunnel syndrome, bilateral    CHF (congestive heart failure) (HCC)    Chronic anticoagulation    a.) ASA + apixaban   COPD (chronic obstructive pulmonary disease) (HCC)    CVA (cerebral vascular accident) (HCC)    Degenerative disc disease, cervical    Diastolic dysfunction    a.) TTE 05/29/2014 --> LVEF 60-65%; G1DD.   DVT (deep venous thrombosis) (HCC)    GERD (gastroesophageal reflux disease)    History of 2019 novel coronavirus disease (COVID-19) 02/08/2019   HLD (hyperlipidemia)    Hypertension    Kidney stones    Osteoarthritis of right shoulder    Pneumonia    Respiratory failure, acute (HCC) 09/20/2020   a.) severe respiratory distress 1 hour after urological surgery. CXR (+) for acute pulmonary edema. Transferred to ICU and placed on NIPPV. Questionable aspiration PNA. (+) A.fib with RVR. Improved with ABX, diuresis, and amiodarone.   Skin cancer of face    a.) RIGHT ear and RIGHT forehead; excised.   Syncope    TIA (transient ischemic attack) 2016   Valvular regurgitation    a.) TTE 05/26/2014 --> LVEF 60-65%; trivial MR, mild TR; no AR or PR. b.) TTE 04/20/2015 --> LVEF 55-60%; trivial MR and PR; no AR or TR.     Current Outpatient Medications  Medication Sig Dispense Refill   acetaminophen (TYLENOL) 500 MG tablet Take 2 tablets (1,000 mg total) by mouth every 6 (six) hours as needed for mild pain.     aspirin EC 81 MG tablet Take 81 mg by mouth daily after supper.     atorvastatin (LIPITOR) 40 MG tablet Take 40 mg by mouth daily after supper.     cetirizine (ZYRTEC) 10 MG tablet Take 10 mg by mouth daily.     clopidogrel (PLAVIX) 75 MG tablet TAKE 1 TABLET BY MOUTH EVERY DAY 90 tablet 3   famotidine (PEPCID) 20 MG  tablet TAKE 1 TABLET BY MOUTH AFTER SUPPER 90 tablet 3   ferrous gluconate (FERGON) 240 (27 FE) MG tablet Take 240 mg by mouth 3 (three) times daily with meals.     isosorbide mononitrate (IMDUR) 30 MG 24 hr tablet TAKE 1 TABLET BY MOUTH EVERY DAY 30 tablet 2   KLOR-CON M20 20 MEQ tablet TAKE 1 TABLET BY MOUTH EVERY DAY 90 tablet 3   losartan (COZAAR) 50 MG tablet TAKE 1 TABLET BY MOUTH EVERY DAY 90 tablet 1   methenamine (HIPREX) 1 g tablet Take 1 tablet (1 g total) by mouth 2 (two) times daily with a meal. 60 tablet 3   metoprolol succinate (TOPROL-XL) 50 MG 24 hr tablet TAKE 1 TABLET BY MOUTH EVERY DAY 90 tablet 1   montelukast (SINGULAIR) 10 MG tablet Take 10 mg by mouth at bedtime.     nitrofurantoin, macrocrystal-monohydrate, (MACROBID) 100 MG capsule Take 1 capsule (100 mg total) by mouth every 12 (twelve) hours. 14 capsule 0   ondansetron (ZOFRAN-ODT) 4 MG disintegrating tablet Take 1 tablet (4 mg total) by mouth every 6 (six) hours as needed for nausea or vomiting. 20 tablet 0   pantoprazole (PROTONIX) 40 MG tablet TAKE 1 TABLET BY MOUTH TWICE A DAY 180 tablet 1   ranolazine (RANEXA) 1000 MG SR tablet TAKE 1 TABLET BY MOUTH TWICE A DAY 180 tablet 1   roflumilast (DALIRESP) 500 MCG TABS tablet Take 500 mcg by mouth daily.     tamsulosin (FLOMAX) 0.4 MG CAPS capsule Take 1 capsule (0.4 mg total) by mouth daily. 30 capsule 11   torsemide (DEMADEX) 20 MG tablet TAKE 3 TABLETS BY  MOUTH EVERY DAY 270 tablet 3   No current facility-administered medications for this visit.   Facility-Administered Medications Ordered in Other Visits  Medication Dose Route Frequency Provider Last Rate Last Admin   sodium chloride flush (NS) 0.9 % injection 3 mL  3 mL Intravenous Q12H Scoggins, Amber, NP          PHYSICAL EXAM:  General:  Well appearing. No resp difficulty HEENT: normal Neck: supple. JVP flat. Carotids 2+ bilaterally; no bruits. No lymphadenopathy or thryomegaly appreciated. Cor: PMI normal. Regular rate & rhythm. No rubs, gallops or murmurs. Lungs: clear Abdomen: soft, nontender, nondistended. No hepatosplenomegaly. No bruits or masses. Good bowel sounds. Extremities: no cyanosis, clubbing, rash, edema Neuro: alert & orientedx3, cranial nerves grossly intact. Moves all 4 extremities w/o difficulty. Affect pleasant.   ECG:   ASSESSMENT & PLAN:  1: Chronic heart failure with preserved ejection fraction with structural changes (LAE)- - NYHA class III - euvolemic today - not weighing daily; encouraged to resume so that he can call for an overnight weight gain of > 2 pounds or a weekly weight gain of > 5 pounds - weight stable from last visit here 3 months ago - not adding salt to his food and trying to read food labels for sodium content; reports decrease appetite due to decreased taste - current BP will not allow for entresto - having issues with recurrent UT'sI so would not be a candidate for SGLT2 - BNP 12/28/20 was 511.4   2: HTN- - BP looks good (103/63) - sees PCP ([facility]) but has to schedule a f/u - BMP 06/11/21 reviewed and showed sodium 139, potassium 3.8, creatinine 1.35 and GFR 53  3: COPD- - saw pulmonology ([facility]) 06/08/21 - wearing oxygen at 5L around the clock - reports oxygen dropping quickly even with short exertion  although quickly recovers  4: Atrial fibrillation- - saw cardiology Welton Flakes) 05/31/21 - irregular rhythm noted  5:  Recurrent UTI's- - saw urology Dannette Barbara) 07/12/21 - saw ID Rivka Safer) 06/03/21 - had pelvic CT done 07/20/21 to rule out prostate abscess   Patient brought 2 separate medication lists which are different. Instructed him to have his granddaughter review the list that we print out and let us know of any errors. This was put on his AVS.   Due to numerous other medical issues, offered to make f/u as PRN but he prefers to be seen yearly. Did advise him that he could call back at anytime to make a sooner appointment.

## 2022-07-22 ENCOUNTER — Ambulatory Visit: Payer: Self-pay

## 2022-07-22 NOTE — Patient Instructions (Signed)
Visit Information  Thank you for taking time to visit with me today. Please don't hesitate to contact me if I can be of assistance to you.   Following are the goals we discussed today:   Goals Addressed             This Visit's Progress    COMPLETED: Education / management of chronic health conditions       Interventions Today    Flowsheet Row Most Recent Value  Chronic Disease   Chronic disease during today's visit Congestive Heart Failure (CHF), Other  [UTI's]  General Interventions   General Interventions Discussed/Reviewed General Interventions Reviewed, Doctor Visits  [evaluation of current treatment plan for HF, UTI's and patients adherence to plan as established by provider. Assessed for HF and ongoing UTI symptoms]  Doctor Visits Discussed/Reviewed Doctor Visits Reviewed  Annabell Sabal scheduled / upcoming provider visits]  Exercise Interventions   Exercise Discussed/Reviewed Physical Activity  Education Interventions   Education Provided Provided Education  [reviewed heart failure action plan. Advised to continue to monitor weight daily.  Advised to notify provider for increase in HF symptoms.]  Pharmacy Interventions   Pharmacy Dicussed/Reviewed Pharmacy Topics Reviewed  [medications reviewed and compliance discussed]               Please call the care guide team at [phone] if you need to cancel or reschedule your appointment.   If you are experiencing a Mental Health or Behavioral Health Crisis or need someone to talk to, please call the Suicide and Crisis Lifeline: 988 call 1-800-273-TALK (toll free, 24 hour hotline)  Patient verbalizes understanding of instructions and care plan provided today and agrees to view in MyChart. Active MyChart status and patient understanding of how to access instructions and care plan via MyChart confirmed with patient.

## 2022-07-22 NOTE — Patient Outreach (Signed)
  Care Coordination   Follow Up Visit Note   07/22/2022 Name: [name] Browning MRN: [id number] DOB: 17-Aug-1940  [name] Browning is a 82 y.o. year old male who sees [facility] for primary care. I spoke with  [name] by phone today.  What matters to the patients health and wellness today?  Patient states he is doing well.  Denies any increase in heart failure symptoms.  Reports keeping follow up appointment with primary provider, cardiologist and heart failure clinic.  Patient reports being seen in ED 06/2022 for chest pain.  Per chart review patient diagnosed with atypical chest pain.   Patient denies any further needs or concerns at this time and is agreeable that care coordination goals have been met.     Goals Addressed             This Visit's Progress    COMPLETED: Education / management of chronic health conditions       Interventions Today    Flowsheet Row Most Recent Value  Chronic Disease   Chronic disease during today's visit Congestive Heart Failure (CHF), Other  [UTI's]  General Interventions   General Interventions Discussed/Reviewed General Interventions Reviewed, Doctor Visits  [evaluation of current treatment plan for HF, UTI's and patients adherence to plan as established by provider. Assessed for HF and ongoing UTI symptoms]  Doctor Visits Discussed/Reviewed Doctor Visits Reviewed  Annabell Sabal scheduled / upcoming provider visits]  Exercise Interventions   Exercise Discussed/Reviewed Physical Activity  Education Interventions   Education Provided Provided Education  [reviewed heart failure action plan. Advised to continue to monitor weight daily.  Advised to notify provider for increase in HF symptoms.]  Pharmacy Interventions   Pharmacy Dicussed/Reviewed Pharmacy Topics Reviewed  [medications reviewed and compliance discussed]              SDOH assessments and interventions completed:  No     Care Coordination Interventions:  Yes, provided    Follow up plan: No further intervention required.   Encounter Outcome:  Pt. Visit Completed   George Ina RN,BSN,CCM [facility] Care Coordination [phone] direct line

## 2022-07-26 ENCOUNTER — Encounter: Payer: Self-pay | Admitting: Cardiovascular Disease

## 2022-07-26 ENCOUNTER — Ambulatory Visit (INDEPENDENT_AMBULATORY_CARE_PROVIDER_SITE_OTHER): Payer: Medicare HMO | Admitting: Cardiovascular Disease

## 2022-07-26 VITALS — BP 120/69 | HR 66 | Ht 74.0 in | Wt 164.0 lb

## 2022-07-26 DIAGNOSIS — I2109 ST elevation (STEMI) myocardial infarction involving other coronary artery of anterior wall: Secondary | ICD-10-CM

## 2022-07-26 DIAGNOSIS — I5032 Chronic diastolic (congestive) heart failure: Secondary | ICD-10-CM | POA: Diagnosis not present

## 2022-07-26 DIAGNOSIS — I10 Essential (primary) hypertension: Secondary | ICD-10-CM | POA: Diagnosis not present

## 2022-07-26 DIAGNOSIS — I4811 Longstanding persistent atrial fibrillation: Secondary | ICD-10-CM | POA: Diagnosis not present

## 2022-07-26 DIAGNOSIS — I5033 Acute on chronic diastolic (congestive) heart failure: Secondary | ICD-10-CM

## 2022-07-26 NOTE — Progress Notes (Signed)
Cardiology Office Note   Date:  07/26/2022   ID:  [name], DOB Jun 19, 1940, MRN [id number]  PCP:  [facility]  Cardiologist:  Adrian Blackwater, MD      History of Present Illness: [name] Browning is a 82 y.o. male who presents for  Chief Complaint  Patient presents with   Follow-up    2 mo F/U    Doing well      Past Medical History:  Diagnosis Date   Anginal pain (HCC)    Anxiety    Aortic atherosclerosis (HCC)    Atrial fibrillation (HCC) 01/2019   Bilateral carpal tunnel syndrome    CAD (coronary artery disease)    a.) LHC 2004 --> normal coronaries. b.) normal stress test in 2007 and 2011; c.) Lexiscan 05/20/2014 --> LVEF 55-65%; no significant stress induced ischemia/arrythmia. d.) CT chest 03/25/2019 --> coronaries carcified.   Carotid atherosclerosis, bilateral    Carpal tunnel syndrome, bilateral    CHF (congestive heart failure) (HCC)    Chronic anticoagulation    a.) ASA + apixaban   COPD (chronic obstructive pulmonary disease) (HCC)    CVA (cerebral vascular accident) (HCC)    Degenerative disc disease, cervical    Diastolic dysfunction    a.) TTE 05/29/2014 --> LVEF 60-65%; G1DD.   DVT (deep venous thrombosis) (HCC)    GERD (gastroesophageal reflux disease)    History of 2019 novel coronavirus disease (COVID-19) 02/08/2019   HLD (hyperlipidemia)    Hypertension    Kidney stones    Osteoarthritis of right shoulder    Pneumonia    Respiratory failure, acute (HCC) 09/20/2020   a.) severe respiratory distress 1 hour after urological surgery. CXR (+) for acute pulmonary edema. Transferred to ICU and placed on NIPPV. Questionable aspiration PNA. (+) A.fib with RVR. Improved with ABX, diuresis, and amiodarone.   Skin cancer of face    a.) RIGHT ear and RIGHT forehead; excised.   Syncope    TIA (transient ischemic attack) 2016   Valvular regurgitation    a.) TTE 05/26/2014 --> LVEF 60-65%; trivial MR, mild TR; no AR or PR. b.) TTE 04/20/2015 -->  LVEF 55-60%; trivial MR and PR; no AR or TR.     Past Surgical History:  Procedure Laterality Date   CARDIAC CATHETERIZATION  2004   CARPAL TUNNEL RELEASE Right 06/11/2013   CENTRAL LINE INSERTION  11/14/2020   Procedure: CENTRAL LINE INSERTION;  Surgeon: Marykay Lex, MD;  Location: [facility] INVASIVE CV LAB;  Service: Cardiovascular;;   CORONARY/GRAFT ACUTE MI REVASCULARIZATION N/A 11/14/2020   Procedure: Coronary/Graft Acute MI Revascularization;  Surgeon: Marykay Lex, MD;  Location: [facility] INVASIVE CV LAB;  Service: Cardiovascular;  Laterality: N/A;   CYSTOSCOPY W/ RETROGRADES  10/16/2020   Procedure: CYSTOSCOPY WITH RETROGRADE PYELOGRAM;  Surgeon: Sondra Come, MD;  Location: [facility] ORS;  Service: Urology;;   CYSTOSCOPY W/ URETERAL STENT PLACEMENT Left 09/20/2020   Procedure: CYSTOSCOPY WITH RETROGRADE PYELOGRAM/URETERAL STENT PLACEMENT;  Surgeon: Jannifer Hick, MD;  Location: [facility] ORS;  Service: Urology;  Laterality: Left;   CYSTOSCOPY/URETEROSCOPY/HOLMIUM LASER/STENT PLACEMENT Left 10/16/2020   Procedure: CYSTOSCOPY/URETEROSCOPY/HOLMIUM LASER/STENT PLACEMENT;  Surgeon: Sondra Come, MD;  Location: [facility] ORS;  Service: Urology;  Laterality: Left;   ESOPHAGOGASTRODUODENOSCOPY N/A 11/06/2020   Procedure: ESOPHAGOGASTRODUODENOSCOPY (EGD);  Surgeon: Midge Minium, MD;  Location: [facility] ENDOSCOPY;  Service: Endoscopy;  Laterality: N/A;   LEFT HEART CATH AND CORONARY ANGIOGRAPHY N/A 11/14/2020   Procedure: LEFT HEART CATH AND CORONARY  ANGIOGRAPHY;  Surgeon: Marykay Lex, MD;  Location: [facility] INVASIVE CV LAB;  Service: Cardiovascular;  Laterality: N/A;   LEFT HEART CATH AND CORONARY ANGIOGRAPHY Left 01/28/2022   Procedure: LEFT HEART CATH AND CORONARY ANGIOGRAPHY;  Surgeon: Laurier Nancy, MD;  Location: [facility] INVASIVE CV LAB;  Service: Cardiovascular;  Laterality: Left;   SHOULDER ARTHROSCOPY WITH OPEN ROTATOR CUFF REPAIR Right 08/24/2017   Procedure: SHOULDER ARTHROSCOPY WITH OPEN ROTATOR  CUFF REPAIR;  Surgeon: Christena Flake, MD;  Location: [facility] ORS;  Service: Orthopedics;  Laterality: Right;   SKIN CANCER EXCISION  12/01/2016   right ear    SKIN CANCER EXCISION     remove from the right side of the face    THROMBECTOMY Right 2004   leg   THYROIDECTOMY  1950   Not sure if total or partial thyroidectomy.     Current Outpatient Medications  Medication Sig Dispense Refill   acetaminophen (TYLENOL) 500 MG tablet Take 2 tablets (1,000 mg total) by mouth every 6 (six) hours as needed for mild pain.     aspirin EC 81 MG tablet Take 81 mg by mouth daily after supper.     atorvastatin (LIPITOR) 40 MG tablet Take 40 mg by mouth daily after supper.     cetirizine (ZYRTEC) 10 MG tablet Take 10 mg by mouth daily.     clopidogrel (PLAVIX) 75 MG tablet TAKE 1 TABLET BY MOUTH EVERY DAY 90 tablet 3   famotidine (PEPCID) 20 MG tablet TAKE 1 TABLET BY MOUTH AFTER SUPPER 90 tablet 3   ferrous gluconate (FERGON) 240 (27 FE) MG tablet Take 240 mg by mouth 3 (three) times daily with meals.     isosorbide mononitrate (IMDUR) 30 MG 24 hr tablet TAKE 1 TABLET BY MOUTH EVERY DAY 30 tablet 2   KLOR-CON M20 20 MEQ tablet TAKE 1 TABLET BY MOUTH EVERY DAY 90 tablet 3   losartan (COZAAR) 50 MG tablet TAKE 1 TABLET BY MOUTH EVERY DAY 90 tablet 1   methenamine (HIPREX) 1 g tablet Take 1 tablet (1 g total) by mouth 2 (two) times daily with a meal. 60 tablet 3   metoprolol succinate (TOPROL-XL) 50 MG 24 hr tablet TAKE 1 TABLET BY MOUTH EVERY DAY 90 tablet 1   montelukast (SINGULAIR) 10 MG tablet Take 10 mg by mouth at bedtime.     nitrofurantoin, macrocrystal-monohydrate, (MACROBID) 100 MG capsule Take 1 capsule (100 mg total) by mouth every 12 (twelve) hours. 14 capsule 0   ondansetron (ZOFRAN-ODT) 4 MG disintegrating tablet Take 1 tablet (4 mg total) by mouth every 6 (six) hours as needed for nausea or vomiting. 20 tablet 0   pantoprazole (PROTONIX) 40 MG tablet TAKE 1 TABLET BY MOUTH TWICE A DAY 180  tablet 1   ranolazine (RANEXA) 1000 MG SR tablet TAKE 1 TABLET BY MOUTH TWICE A DAY 180 tablet 1   roflumilast (DALIRESP) 500 MCG TABS tablet Take 500 mcg by mouth daily.     tamsulosin (FLOMAX) 0.4 MG CAPS capsule Take 1 capsule (0.4 mg total) by mouth daily. 30 capsule 11   torsemide (DEMADEX) 20 MG tablet TAKE 3 TABLETS BY MOUTH EVERY DAY 270 tablet 3   No current facility-administered medications for this visit.   Facility-Administered Medications Ordered in Other Visits  Medication Dose Route Frequency Provider Last Rate Last Admin   sodium chloride flush (NS) 0.9 % injection 3 mL  3 mL Intravenous Q12H Scoggins, Amber, NP  Allergies:   Hydromorphone    Social History:   reports that he quit smoking about 19 years ago. His smoking use included cigarettes. He started smoking about 65 years ago. He has a 46.1 pack-year smoking history. He has been exposed to tobacco smoke. He quit smokeless tobacco use about 19 years ago.  His smokeless tobacco use included snuff. He reports that he does not currently use alcohol after a past usage of about 7.0 standard drinks of alcohol per week. He reports that he does not use drugs.   Family History:  family history includes CAD in his father; Heart attack in his father; Heart disease in his mother; Hypertension in his mother.    ROS:     Review of Systems  Constitutional: Negative.   HENT: Negative.    Eyes: Negative.   Respiratory: Negative.    Gastrointestinal: Negative.   Genitourinary: Negative.   Musculoskeletal: Negative.   Skin: Negative.   Neurological: Negative.   Endo/Heme/Allergies: Negative.   Psychiatric/Behavioral: Negative.    All other systems reviewed and are negative.     All other systems are reviewed and negative.    PHYSICAL EXAM: VS:  BP 120/69   Pulse 66   Ht 6\' 2"  (1.88 m)   Wt 164 lb (74.4 kg)   SpO2 93%   BMI 21.06 kg/m  , BMI Body mass index is 21.06 kg/m. Last weight:  Wt Readings from Last 3  Encounters:  07/26/22 164 lb (74.4 kg)  07/03/22 162 lb (73.5 kg)  06/21/22 163 lb (73.9 kg)     Physical Exam Vitals reviewed.  Constitutional:      Appearance: Normal appearance. He is normal weight.  HENT:     Head: Normocephalic.     Nose: Nose normal.     Mouth/Throat:     Mouth: Mucous membranes are moist.  Eyes:     Pupils: Pupils are equal, round, and reactive to light.  Cardiovascular:     Rate and Rhythm: Normal rate and regular rhythm.     Pulses: Normal pulses.     Heart sounds: Normal heart sounds.  Pulmonary:     Effort: Pulmonary effort is normal.  Abdominal:     General: Abdomen is flat. Bowel sounds are normal.  Musculoskeletal:        General: Normal range of motion.     Cervical back: Normal range of motion.  Skin:    General: Skin is warm.  Neurological:     General: No focal deficit present.     Mental Status: He is alert.  Psychiatric:        Mood and Affect: Mood normal.       EKG:   Recent Labs: 03/06/2022: Magnesium 2.1 05/06/2022: ALT 12 07/03/2022: BUN 23; Creatinine, Ser 1.19; Hemoglobin 13.8; Platelets 204; Potassium 4.1; Sodium 143    Lipid Panel    Component Value Date/Time   CHOL 141 11/12/2020 0435   CHOL 175 06/01/2015 1049   TRIG 47 11/12/2020 0435   HDL 38 (L) 11/12/2020 0435   HDL 70 06/01/2015 1049   CHOLHDL 3.7 11/12/2020 0435   VLDL 9 11/12/2020 0435   LDLCALC 94 11/12/2020 0435   LDLCALC 105 (H) 12/07/2016 0958      Other studies Reviewed: Additional studies/ records that were reviewed today include:  Review of the above records demonstrates:       No data to display            ASSESSMENT AND  PLAN:    ICD-10-CM   1. Chronic heart failure with preserved ejection fraction (HCC)  I50.32    Compensated, doing well    2. Acute on chronic diastolic CHF (congestive heart failure) (HCC)  I50.33     3. Acute ST elevation myocardial infarction (STEMI) of anterior wall (HCC)  I21.09     4. Longstanding  persistent atrial fibrillation (HCC)  I48.11     5. Primary hypertension  I10    stable       Problem List Items Addressed This Visit       Cardiovascular and Mediastinum   HTN (hypertension)   Atrial fibrillation (HCC)   Acute on chronic diastolic CHF (congestive heart failure) (HCC)   Acute ST elevation myocardial infarction (STEMI) of anterior wall (HCC)   (HFpEF) heart failure with preserved ejection fraction (HCC) - Primary       Disposition:   Return in about 3 months (around 10/26/2022).    Total time spent: 30 minutes  Signed,  Adrian Blackwater, MD  07/26/2022 9:32 AM    [facility]

## 2022-07-29 ENCOUNTER — Encounter: Payer: Self-pay | Admitting: Family

## 2022-07-29 ENCOUNTER — Ambulatory Visit: Payer: Medicare HMO | Attending: Family | Admitting: Family

## 2022-07-29 VITALS — BP 116/67 | HR 82 | Ht 74.0 in | Wt 163.0 lb

## 2022-07-29 DIAGNOSIS — I4891 Unspecified atrial fibrillation: Secondary | ICD-10-CM | POA: Diagnosis not present

## 2022-07-29 DIAGNOSIS — Z8673 Personal history of transient ischemic attack (TIA), and cerebral infarction without residual deficits: Secondary | ICD-10-CM | POA: Diagnosis not present

## 2022-07-29 DIAGNOSIS — I130 Hypertensive heart and chronic kidney disease with heart failure and stage 1 through stage 4 chronic kidney disease, or unspecified chronic kidney disease: Secondary | ICD-10-CM | POA: Diagnosis not present

## 2022-07-29 DIAGNOSIS — Z87440 Personal history of urinary (tract) infections: Secondary | ICD-10-CM | POA: Insufficient documentation

## 2022-07-29 DIAGNOSIS — K219 Gastro-esophageal reflux disease without esophagitis: Secondary | ICD-10-CM | POA: Insufficient documentation

## 2022-07-29 DIAGNOSIS — I10 Essential (primary) hypertension: Secondary | ICD-10-CM | POA: Diagnosis not present

## 2022-07-29 DIAGNOSIS — Z87891 Personal history of nicotine dependence: Secondary | ICD-10-CM | POA: Insufficient documentation

## 2022-07-29 DIAGNOSIS — N189 Chronic kidney disease, unspecified: Secondary | ICD-10-CM | POA: Diagnosis not present

## 2022-07-29 DIAGNOSIS — I4811 Longstanding persistent atrial fibrillation: Secondary | ICD-10-CM | POA: Diagnosis not present

## 2022-07-29 DIAGNOSIS — Z79899 Other long term (current) drug therapy: Secondary | ICD-10-CM | POA: Insufficient documentation

## 2022-07-29 DIAGNOSIS — Z87442 Personal history of urinary calculi: Secondary | ICD-10-CM | POA: Insufficient documentation

## 2022-07-29 DIAGNOSIS — N390 Urinary tract infection, site not specified: Secondary | ICD-10-CM

## 2022-07-29 DIAGNOSIS — J449 Chronic obstructive pulmonary disease, unspecified: Secondary | ICD-10-CM | POA: Insufficient documentation

## 2022-07-29 DIAGNOSIS — E785 Hyperlipidemia, unspecified: Secondary | ICD-10-CM | POA: Diagnosis not present

## 2022-07-29 DIAGNOSIS — I2510 Atherosclerotic heart disease of native coronary artery without angina pectoris: Secondary | ICD-10-CM | POA: Insufficient documentation

## 2022-07-29 DIAGNOSIS — I5032 Chronic diastolic (congestive) heart failure: Secondary | ICD-10-CM

## 2022-07-29 NOTE — Progress Notes (Signed)
PCP: [facility] (returns 08/24) Primary Cardiologist: Adrian Blackwater, MD (last seen 07/24)  HPI:  [name] Browning is a 82 y/o male with a history of carotid disease, CAD s/p 3 strents, hyperlipidemia, HTN, CKD, stroke, anxiety, atrial fibrillation, COPD, DVT, GERD, frequent UTI's, previous tobacco use and chronic heart failure.   Echo 11/15/20:  EF of 60-65% along with severe LAE and mild [name].  Echo 03/17/2021: EF of 60-65%.   LHC done 11/14/20 showed: Ost RCA lesion is 95% stenosed.   Prox RCA to Dist RCA lesion is 100% stenosed. -Very minimal flow.  After initial angiography, it disappeared to progressed to the ostium.   Left-to-right collaterals via septal perforators fill the PDA system.   Mid LM to Dist LM lesion is 20% stenosed.   Ost LAD to Prox LAD lesion is 25% stenosed.  Relatively small caliber vessel that wraps the apex.   Diminutive LCx with moderate sized OM1, mild disease.   The left ventricular systolic function is normal. No obvious regional wall motion normalities.   The left ventricular ejection fraction is 55-65% by visual estimate.   LV end diastolic pressure is moderately elevated.  18 mmHg   There is no aortic valve stenosis.  LHC 01/28/22:   Ost RCA to Prox RCA lesion is 30% stenosed.   Ost LM to Prox LAD lesion is 30% stenosed.   Ost Cx lesion is 40% stenosed.   LV end diastolic pressure is moderately elevated.  Stents in osteal RCA and left main patent, with mild osteal LCX disease. LV gram deffered due to CRI. Treatv medically  Was in the ED 03/14/22 due to exertional chest pain, relieved by NTG spray. Was in the ED 05/06/22 due to burning urgency frequency and had some blood in his urine. Denies flank or abdominal pain denies fevers or chills. Augmentin provided. Was in the ED 07/03/22 due to chest pain and SOB which became worse with hot/ humid weather. Evaluated and released.     He presents today for a follow-up visit with a chief complaint of moderate fatigue with minimal  exertion. Chronic in nature. Has associated SOB, occasional chest pain and continued urinary symptoms along with this. Denies cough, palpitations, pedal edema, abdominal distention, dizziness  Says that he no longer has to wear oxygen.   ROS: All systems negative except as listed in HPI, PMH and Problem List.  SH:  Social History   Socioeconomic History   Marital status: Legally Separated    Spouse name: Not on file   Number of children: 1   Years of education: Not on file   Highest education level: Not on file  Occupational History   Not on file  Tobacco Use   Smoking status: Former    Current packs/day: 0.00    Average packs/day: 1 pack/day for 46.1 years (46.1 ttl pk-yrs)    Types: Cigarettes    Start date: 01/10/1957    Quit date: 02/11/2003    Years since quitting: 19.4    Passive exposure: Past   Smokeless tobacco: Former    Types: Snuff    Quit date: 02/11/2003  Vaping Use   Vaping status: Never Used  Substance and Sexual Activity   Alcohol use: Not Currently    Alcohol/week: 7.0 standard drinks of alcohol    Types: 7 Cans of beer per week   Drug use: No   Sexual activity: Yes    Partners: Female  Other Topics Concern   Not on file  Social History Narrative  Live with grandaughter, April. 1 son that lives in [place].   Social Determinants of Health   Financial Resource Strain: Not on file  Food Insecurity: No Food Insecurity (05/20/2022)   Hunger Vital Sign    Worried About Running Out of Food in the Last Year: Never true    Ran Out of Food in the Last Year: Never true  Transportation Needs: No Transportation Needs (05/20/2022)   PRAPARE - Administrator, Civil Service (Medical): No    Lack of Transportation (Non-Medical): No  Physical Activity: Not on file  Stress: Not on file  Social Connections: Not on file  Intimate Partner Violence: Not At Risk (03/05/2022)   Humiliation, Afraid, Rape, and Kick questionnaire    Fear of Current or Ex-Partner:  No    Emotionally Abused: No    Physically Abused: No    Sexually Abused: No    FH:  Family History  Problem Relation Age of Onset   Hypertension Mother    Heart disease Mother    CAD Father    Heart attack Father     Past Medical History:  Diagnosis Date   Anginal pain (HCC)    Anxiety    Aortic atherosclerosis (HCC)    Atrial fibrillation (HCC) 01/2019   Bilateral carpal tunnel syndrome    CAD (coronary artery disease)    a.) LHC 2004 --> normal coronaries. b.) normal stress test in 2007 and 2011; c.) Lexiscan 05/20/2014 --> LVEF 55-65%; no significant stress induced ischemia/arrythmia. d.) CT chest 03/25/2019 --> coronaries carcified.   Carotid atherosclerosis, bilateral    Carpal tunnel syndrome, bilateral    CHF (congestive heart failure) (HCC)    Chronic anticoagulation    a.) ASA + apixaban   COPD (chronic obstructive pulmonary disease) (HCC)    CVA (cerebral vascular accident) (HCC)    Degenerative disc disease, cervical    Diastolic dysfunction    a.) TTE 05/29/2014 --> LVEF 60-65%; G1DD.   DVT (deep venous thrombosis) (HCC)    GERD (gastroesophageal reflux disease)    History of 2019 novel coronavirus disease (COVID-19) 02/08/2019   HLD (hyperlipidemia)    Hypertension    Kidney stones    Osteoarthritis of right shoulder    Pneumonia    Respiratory failure, acute (HCC) 09/20/2020   a.) severe respiratory distress 1 hour after urological surgery. CXR (+) for acute pulmonary edema. Transferred to ICU and placed on NIPPV. Questionable aspiration PNA. (+) A.fib with RVR. Improved with ABX, diuresis, and amiodarone.   Skin cancer of face    a.) RIGHT ear and RIGHT forehead; excised.   Syncope    TIA (transient ischemic attack) 2016   Valvular regurgitation    a.) TTE 05/26/2014 --> LVEF 60-65%; trivial [name], mild TR; no AR or PR. b.) TTE 04/20/2015 --> LVEF 55-60%; trivial [name] and PR; no AR or TR.    Current Outpatient Medications  Medication Sig Dispense Refill    acetaminophen (TYLENOL) 500 MG tablet Take 2 tablets (1,000 mg total) by mouth every 6 (six) hours as needed for mild pain.     aspirin EC 81 MG tablet Take 81 mg by mouth daily after supper.     atorvastatin (LIPITOR) 40 MG tablet Take 40 mg by mouth daily after supper.     cetirizine (ZYRTEC) 10 MG tablet Take 10 mg by mouth daily.     clopidogrel (PLAVIX) 75 MG tablet TAKE 1 TABLET BY MOUTH EVERY DAY 90 tablet 3   famotidine (  PEPCID) 20 MG tablet TAKE 1 TABLET BY MOUTH AFTER SUPPER 90 tablet 3   ferrous gluconate (FERGON) 240 (27 FE) MG tablet Take 240 mg by mouth 3 (three) times daily with meals.     isosorbide mononitrate (IMDUR) 30 MG 24 hr tablet TAKE 1 TABLET BY MOUTH EVERY DAY 30 tablet 2   KLOR-CON M20 20 MEQ tablet TAKE 1 TABLET BY MOUTH EVERY DAY 90 tablet 3   losartan (COZAAR) 50 MG tablet TAKE 1 TABLET BY MOUTH EVERY DAY 90 tablet 1   methenamine (HIPREX) 1 g tablet Take 1 tablet (1 g total) by mouth 2 (two) times daily with a meal. 60 tablet 3   metoprolol succinate (TOPROL-XL) 50 MG 24 hr tablet TAKE 1 TABLET BY MOUTH EVERY DAY 90 tablet 1   montelukast (SINGULAIR) 10 MG tablet Take 10 mg by mouth at bedtime.     nitrofurantoin, macrocrystal-monohydrate, (MACROBID) 100 MG capsule Take 1 capsule (100 mg total) by mouth every 12 (twelve) hours. 14 capsule 0   ondansetron (ZOFRAN-ODT) 4 MG disintegrating tablet Take 1 tablet (4 mg total) by mouth every 6 (six) hours as needed for nausea or vomiting. 20 tablet 0   pantoprazole (PROTONIX) 40 MG tablet TAKE 1 TABLET BY MOUTH TWICE A DAY 180 tablet 1   ranolazine (RANEXA) 1000 MG SR tablet TAKE 1 TABLET BY MOUTH TWICE A DAY 180 tablet 1   roflumilast (DALIRESP) 500 MCG TABS tablet Take 500 mcg by mouth daily.     tamsulosin (FLOMAX) 0.4 MG CAPS capsule Take 1 capsule (0.4 mg total) by mouth daily. 30 capsule 11   torsemide (DEMADEX) 20 MG tablet TAKE 3 TABLETS BY MOUTH EVERY DAY 270 tablet 3   No current facility-administered  medications for this visit.   Facility-Administered Medications Ordered in Other Visits  Medication Dose Route Frequency Provider Last Rate Last Admin   sodium chloride flush (NS) 0.9 % injection 3 mL  3 mL Intravenous Q12H Scoggins, Amber, NP       Vitals:   07/29/22 1048  BP: 116/67  Pulse: 82  SpO2: 96%  Weight: 163 lb (73.9 kg)  Height: 6\' 2"  (1.88 m)   Wt Readings from Last 3 Encounters:  07/29/22 163 lb (73.9 kg)  07/26/22 164 lb (74.4 kg)  07/03/22 162 lb (73.5 kg)   Lab Results  Component Value Date   CREATININE 1.19 07/03/2022   CREATININE 1.10 05/06/2022   CREATININE 1.14 03/14/2022   PHYSICAL EXAM:  General:  Well appearing. No resp difficulty HEENT: normal Neck: supple. JVP flat. No lymphadenopathy or thryomegaly appreciated. Cor: PMI normal. Regular rate & rhythm. No rubs, gallops or murmurs. Lungs: clear Abdomen: soft, nontender, nondistended. No hepatosplenomegaly. No bruits or masses.  Extremities: no cyanosis, clubbing, rash, edema Neuro: alert & oriented x3, cranial nerves grossly intact. Moves all 4 extremities w/o difficulty. Affect pleasant.   ECG: not done   ASSESSMENT & PLAN:  1: Chronic heart failure with preserved ejection fraction- - NYHA class III - euvolemic today - not weighing daily; encouraged to resume so that he can call for an overnight weight gain of > 2 pounds or a weekly weight gain of > 5 pounds - weight down 22 pounds from last visit here 1 year ago - not adding salt to his food and trying to read food labels for sodium content - does not decreased appetite and feeling full easily - continue losartan 50mg  daily - continue metoprolol succinate 50mg  daily - continue torsemide  60mg  daily - current BP will not allow for entresto - having issues with recurrent UTI's so would not be a candidate for SGLT2 - BNP 12/28/20 was 511.4   2: HTN- - BP 116/67 - sees PCP ([facility]) 08/24 - BMP 07/03/22 reviewed and showed sodium  143, potassium 4.1, creatinine 1.19 and GFR >60  3: COPD- - saw pulmonology ([facility]) 05/23 - no longer wearing oxygen - continue daliresp daily  4: Atrial fibrillation- - saw cardiology Welton Flakes) 07/24  5: Recurrent UTI's- - saw urology [facility]) 06/24 - on maintenance antibiotics - CT 02/2022: 1 mm right upper pole stone, 10 mm left lower pole stone w/ 2 mm and 3 mm left renal stones   Return in 1 year, sooner if needed.

## 2022-08-03 DIAGNOSIS — H0016 Chalazion left eye, unspecified eyelid: Secondary | ICD-10-CM | POA: Diagnosis not present

## 2022-08-03 DIAGNOSIS — Z6821 Body mass index (BMI) 21.0-21.9, adult: Secondary | ICD-10-CM | POA: Diagnosis not present

## 2022-08-03 NOTE — Progress Notes (Unsigned)
08/04/2022 2:42 PM   [name] Browning [birth year] [id number]  Referring provider: [facility] [address] [place],  [place] [address]  Urological history: 1. Nephrolithiasis -left URS (09/2020)  -CT (02/2022) - 1 mm right upper pole stone, 10 mm left lower pole stone w/ 2 mm and 3 mm left renal stones   2. rUTI's -contributing factors of age, constipation and inadequate bladder emptying -documented urine cultures over the last year  06/21/2022 MDRO E.coli  05/06/2022 ESBL E.coli  12/29/2021 MDRO E.coli  11/24/2021 MDRO E.coli  11/02/2021 MDRO E.coli  09/30/2021 MDRO E.coli  08/06/2021 ESBL E.coli -methenamine 1 gram twice daily   3. BPH with LU TS -tamsulosin 0.4 mg daily   No chief complaint on file.  HPI: [name] Browning is a 82 y.o. male who presents today for bladder rescue solution.   Previous records reviewed.    PMH: Past Medical History:  Diagnosis Date   Anginal pain (HCC)    Anxiety    Aortic atherosclerosis (HCC)    Atrial fibrillation (HCC) 01/2019   Bilateral carpal tunnel syndrome    CAD (coronary artery disease)    a.) LHC 2004 --> normal coronaries. b.) normal stress test in 2007 and 2011; c.) Lexiscan 05/20/2014 --> LVEF 55-65%; no significant stress induced ischemia/arrythmia. d.) CT chest 03/25/2019 --> coronaries carcified.   Carotid atherosclerosis, bilateral    Carpal tunnel syndrome, bilateral    CHF (congestive heart failure) (HCC)    Chronic anticoagulation    a.) ASA + apixaban   COPD (chronic obstructive pulmonary disease) (HCC)    CVA (cerebral vascular accident) (HCC)    Degenerative disc disease, cervical    Diastolic dysfunction    a.) TTE 05/29/2014 --> LVEF 60-65%; G1DD.   DVT (deep venous thrombosis) (HCC)    GERD (gastroesophageal reflux disease)    History of 2019 novel coronavirus disease (COVID-19) 02/08/2019   HLD (hyperlipidemia)    Hypertension    Kidney stones    Osteoarthritis of right shoulder    Pneumonia     Respiratory failure, acute (HCC) 09/20/2020   a.) severe respiratory distress 1 hour after urological surgery. CXR (+) for acute pulmonary edema. Transferred to ICU and placed on NIPPV. Questionable aspiration PNA. (+) A.fib with RVR. Improved with ABX, diuresis, and amiodarone.   Skin cancer of face    a.) RIGHT ear and RIGHT forehead; excised.   Syncope    TIA (transient ischemic attack) 2016   Valvular regurgitation    a.) TTE 05/26/2014 --> LVEF 60-65%; trivial MR, mild TR; no AR or PR. b.) TTE 04/20/2015 --> LVEF 55-60%; trivial MR and PR; no AR or TR.    Surgical History: Past Surgical History:  Procedure Laterality Date   CARDIAC CATHETERIZATION  2004   CARPAL TUNNEL RELEASE Right 06/11/2013   CENTRAL LINE INSERTION  11/14/2020   Procedure: CENTRAL LINE INSERTION;  Surgeon: Marykay Lex, MD;  Location: [facility] INVASIVE CV LAB;  Service: Cardiovascular;;   CORONARY/GRAFT ACUTE MI REVASCULARIZATION N/A 11/14/2020   Procedure: Coronary/Graft Acute MI Revascularization;  Surgeon: Marykay Lex, MD;  Location: [facility] INVASIVE CV LAB;  Service: Cardiovascular;  Laterality: N/A;   CYSTOSCOPY W/ RETROGRADES  10/16/2020   Procedure: CYSTOSCOPY WITH RETROGRADE PYELOGRAM;  Surgeon: Sondra Come, MD;  Location: [facility] ORS;  Service: Urology;;   CYSTOSCOPY W/ URETERAL STENT PLACEMENT Left 09/20/2020   Procedure: CYSTOSCOPY WITH RETROGRADE PYELOGRAM/URETERAL STENT PLACEMENT;  Surgeon: Jannifer Hick, MD;  Location: [facility] ORS;  Service: Urology;  Laterality: Left;   CYSTOSCOPY/URETEROSCOPY/HOLMIUM LASER/STENT PLACEMENT Left 10/16/2020   Procedure: CYSTOSCOPY/URETEROSCOPY/HOLMIUM LASER/STENT PLACEMENT;  Surgeon: Sondra Come, MD;  Location: [facility] ORS;  Service: Urology;  Laterality: Left;   ESOPHAGOGASTRODUODENOSCOPY N/A 11/06/2020   Procedure: ESOPHAGOGASTRODUODENOSCOPY (EGD);  Surgeon: Midge Minium, MD;  Location: [facility] ENDOSCOPY;  Service: Endoscopy;  Laterality: N/A;   LEFT HEART CATH AND  CORONARY ANGIOGRAPHY N/A 11/14/2020   Procedure: LEFT HEART CATH AND CORONARY ANGIOGRAPHY;  Surgeon: Marykay Lex, MD;  Location: [facility] INVASIVE CV LAB;  Service: Cardiovascular;  Laterality: N/A;   LEFT HEART CATH AND CORONARY ANGIOGRAPHY Left 01/28/2022   Procedure: LEFT HEART CATH AND CORONARY ANGIOGRAPHY;  Surgeon: Laurier Nancy, MD;  Location: [facility] INVASIVE CV LAB;  Service: Cardiovascular;  Laterality: Left;   SHOULDER ARTHROSCOPY WITH OPEN ROTATOR CUFF REPAIR Right 08/24/2017   Procedure: SHOULDER ARTHROSCOPY WITH OPEN ROTATOR CUFF REPAIR;  Surgeon: Christena Flake, MD;  Location: [facility] ORS;  Service: Orthopedics;  Laterality: Right;   SKIN CANCER EXCISION  12/01/2016   right ear    SKIN CANCER EXCISION     remove from the right side of the face    THROMBECTOMY Right 2004   leg   THYROIDECTOMY  1950   Not sure if total or partial thyroidectomy.    Home Medications:  Allergies as of 08/04/2022       Reactions   Hydromorphone    Hallucination Pt reports he doesn't know about this        Medication List        Accurate as of August 03, 2022  2:42 PM. If you have any questions, ask your nurse or doctor.          acetaminophen 500 MG tablet Commonly known as: TYLENOL Take 2 tablets (1,000 mg total) by mouth every 6 (six) hours as needed for mild pain.   aspirin EC 81 MG tablet Take 81 mg by mouth daily after supper.   atorvastatin 40 MG tablet Commonly known as: LIPITOR Take 40 mg by mouth daily after supper.   cetirizine 10 MG tablet Commonly known as: ZYRTEC Take 10 mg by mouth daily.   clopidogrel 75 MG tablet Commonly known as: PLAVIX TAKE 1 TABLET BY MOUTH EVERY DAY   famotidine 20 MG tablet Commonly known as: PEPCID TAKE 1 TABLET BY MOUTH AFTER SUPPER   ferrous gluconate 240 (27 FE) MG tablet Commonly known as: FERGON Take 240 mg by mouth 3 (three) times daily with meals.   isosorbide mononitrate 30 MG 24 hr tablet Commonly known as: IMDUR TAKE 1  TABLET BY MOUTH EVERY DAY   Klor-Con M20 20 MEQ tablet Generic drug: potassium chloride SA TAKE 1 TABLET BY MOUTH EVERY DAY   losartan 50 MG tablet Commonly known as: COZAAR TAKE 1 TABLET BY MOUTH EVERY DAY   methenamine 1 g tablet Commonly known as: Hiprex Take 1 tablet (1 g total) by mouth 2 (two) times daily with a meal.   metoprolol succinate 50 MG 24 hr tablet Commonly known as: TOPROL-XL TAKE 1 TABLET BY MOUTH EVERY DAY   montelukast 10 MG tablet Commonly known as: SINGULAIR Take 10 mg by mouth at bedtime.   nitrofurantoin (macrocrystal-monohydrate) 100 MG capsule Commonly known as: MACROBID Take 1 capsule (100 mg total) by mouth every 12 (twelve) hours.   ondansetron 4 MG disintegrating tablet Commonly known as: ZOFRAN-ODT Take 1 tablet (4 mg total) by mouth every 6 (six) hours as needed for nausea or  vomiting.   pantoprazole 40 MG tablet Commonly known as: PROTONIX TAKE 1 TABLET BY MOUTH TWICE A DAY   ranolazine 1000 MG SR tablet Commonly known as: RANEXA TAKE 1 TABLET BY MOUTH TWICE A DAY   roflumilast 500 MCG Tabs tablet Commonly known as: DALIRESP Take 500 mcg by mouth daily.   tamsulosin 0.4 MG Caps capsule Commonly known as: FLOMAX Take 1 capsule (0.4 mg total) by mouth daily.   torsemide 20 MG tablet Commonly known as: DEMADEX TAKE 3 TABLETS BY MOUTH EVERY DAY        Allergies:  Allergies  Allergen Reactions   Hydromorphone     Hallucination Pt reports he doesn't know about this    Family History: Family History  Problem Relation Age of Onset   Hypertension Mother    Heart disease Mother    CAD Father    Heart attack Father     Social History:  reports that he quit smoking about 19 years ago. His smoking use included cigarettes. He started smoking about 65 years ago. He has a 46.1 pack-year smoking history. He has been exposed to tobacco smoke. He quit smokeless tobacco use about 19 years ago.  His smokeless tobacco use included  snuff. He reports that he does not currently use alcohol after a past usage of about 7.0 standard drinks of alcohol per week. He reports that he does not use drugs.  ROS: Pertinent ROS in HPI  Physical Exam: There were no vitals taken for this visit.  Constitutional:  Well nourished. Alert and oriented, No acute distress. HEENT: [place] AT, moist mucus membranes.  Trachea midline Cardiovascular: No clubbing, cyanosis, or edema. Respiratory: Normal respiratory effort, no increased work of breathing. GU: No CVA tenderness.  No bladder fullness or masses.  Patient with circumcised/uncircumcised phallus. ***Foreskin easily retracted***  Urethral meatus is patent.  No penile discharge. No penile lesions or rashes. Scrotum without lesions, cysts, rashes and/or edema.  Testicles are located scrotally bilaterally. No masses are appreciated in the testicles. Left and right epididymis are normal. Rectal: Patient with  normal sphincter tone. Anus and perineum without scarring or rashes. No rectal masses are appreciated. Prostate is approximately *** grams, *** nodules are appreciated. Seminal vesicles are normal. Neurologic: Grossly intact, no focal deficits, moving all 4 extremities. Psychiatric: Normal mood and affect.   Laboratory Data: Lab Results  Component Value Date   WBC 6.9 07/03/2022   HGB 13.8 07/03/2022   HCT 43.4 07/03/2022   MCV 91.4 07/03/2022   PLT 204 07/03/2022    Lab Results  Component Value Date   CREATININE 1.19 07/03/2022   Lab Results  Component Value Date   AST 26 05/06/2022   Lab Results  Component Value Date   ALT 12 05/06/2022  I have reviewed the labs.   Pertinent Imaging: N/A  Assessment & Plan:    1. Testicular pain -UA with pyuria and bacteriuria -urine culture pending -likely referred pain  2. BPH with LU TS -tamsulosin 0.4 mg daily  3. rUTI's -UA with pyuria and bacteriuria -urine culture pending -given 80 mg Gentamicin in the office -start  Macrobid 100 mg BI x 7 days -hold Hiprex while on antibiotics  No follow-ups on file.  These notes generated with voice recognition software. I apologize for typographical errors.  Cloretta Ned  [facility] [address]  [address] [place], [place] [address] [phone]

## 2022-08-04 ENCOUNTER — Encounter: Payer: Self-pay | Admitting: Urology

## 2022-08-04 ENCOUNTER — Ambulatory Visit (INDEPENDENT_AMBULATORY_CARE_PROVIDER_SITE_OTHER): Payer: Medicare HMO | Admitting: Urology

## 2022-08-04 VITALS — BP 115/64 | HR 65 | Wt 163.0 lb

## 2022-08-04 DIAGNOSIS — R102 Pelvic and perineal pain: Secondary | ICD-10-CM

## 2022-08-04 DIAGNOSIS — N50812 Left testicular pain: Secondary | ICD-10-CM

## 2022-08-04 DIAGNOSIS — N390 Urinary tract infection, site not specified: Secondary | ICD-10-CM

## 2022-08-04 DIAGNOSIS — N50811 Right testicular pain: Secondary | ICD-10-CM

## 2022-08-04 DIAGNOSIS — N401 Enlarged prostate with lower urinary tract symptoms: Secondary | ICD-10-CM

## 2022-08-04 MED ORDER — LIDOCAINE HCL 1 % IJ SOLN
11.0000 mL | Freq: Once | INTRAMUSCULAR | Status: AC
Start: 2022-08-04 — End: 2022-08-04
  Administered 2022-08-04: 11 mL

## 2022-08-04 NOTE — Addendum Note (Signed)
Addended by: Tilden Dome on: 08/04/2022 05:00 PM   Modules accepted: Orders

## 2022-08-08 ENCOUNTER — Other Ambulatory Visit: Payer: Self-pay | Admitting: Internal Medicine

## 2022-08-09 ENCOUNTER — Ambulatory Visit (INDEPENDENT_AMBULATORY_CARE_PROVIDER_SITE_OTHER): Payer: Medicare HMO | Admitting: Urology

## 2022-08-09 VITALS — BP 92/58 | HR 52 | Ht 74.0 in | Wt 162.0 lb

## 2022-08-09 DIAGNOSIS — R399 Unspecified symptoms and signs involving the genitourinary system: Secondary | ICD-10-CM

## 2022-08-09 MED ORDER — SODIUM BICARBONATE 8.4 % IV SOLN
11.0000 mL | Freq: Once | INTRAVENOUS | Status: AC
Start: 2022-08-09 — End: 2022-08-09
  Administered 2022-08-09: 11 mL

## 2022-08-09 NOTE — Patient Instructions (Signed)
Clarisa Kindred NP fills his diuretic.

## 2022-08-09 NOTE — Progress Notes (Signed)
Bladder Rescue Solution Instillation  Due to dysuria.   Patient is present today for a Rescue Solution Treatment.  Patient was cleaned and prepped in a sterile fashion with betadine and lidocaine 2% jelly was instilled into the urethra.  A 14  FR catheter was inserted, urine return was noted 20 ml, urine was yellow clear in color.  Instilled a solution consisting of 8ml of Sodium Bicarb, 2 ml Lidocaine and 1 ml of Heparin. The catheter was then removed. Patient tolerated well, no complications were noted.   Performed by: Michiel Cowboy, PA-C   Follow up/ Additional Notes: Return Thursday.    He mentioned that his symptoms are lessened for 3 hours after he takes his diuretic, so he will try taking his diuretic at night deceiving get some rest.

## 2022-08-11 ENCOUNTER — Ambulatory Visit (INDEPENDENT_AMBULATORY_CARE_PROVIDER_SITE_OTHER): Payer: Medicare HMO | Admitting: Urology

## 2022-08-11 ENCOUNTER — Encounter: Payer: Self-pay | Admitting: Urology

## 2022-08-11 VITALS — BP 131/61 | HR 80 | Wt 160.6 lb

## 2022-08-11 DIAGNOSIS — R399 Unspecified symptoms and signs involving the genitourinary system: Secondary | ICD-10-CM | POA: Diagnosis not present

## 2022-08-11 NOTE — Progress Notes (Signed)
Bladder Rescue Solution Instillation  Due to pelvic pain patient is present today for a Rescue Solution Treatment.  Patient was cleaned and prepped in a sterile fashion with betadine and lidocaine 2% jelly was instilled into the urethra.  A 14  FR catheter was inserted, urine return was noted 20 ml, urine was yellow in color.  Instilled a solution consisting of 8ml of Sodium Bicarb, 2 ml Lidocaine and 1 ml of Heparin. The catheter was then removed. Patient tolerated well, no complications were noted.   Performed by: Michiel Cowboy, PA-C   Follow up/ Additional Notes: Follow up on Tuesday

## 2022-08-12 DIAGNOSIS — L0390 Cellulitis, unspecified: Secondary | ICD-10-CM | POA: Diagnosis not present

## 2022-08-12 DIAGNOSIS — Z6821 Body mass index (BMI) 21.0-21.9, adult: Secondary | ICD-10-CM | POA: Diagnosis not present

## 2022-08-14 ENCOUNTER — Other Ambulatory Visit: Payer: Self-pay | Admitting: Internal Medicine

## 2022-08-15 NOTE — Progress Notes (Unsigned)
Bladder Rescue Solution Instillation  Previous records reviewed.   Due to pelvic pain patient is present today for a Rescue Solution Treatment.  Patient was cleaned and prepped in a sterile fashion with betadine and lidocaine 2% jelly was instilled into the urethra.  A 14 FR catheter was inserted, urine return was noted 200 ml, urine was dark yellow in color.  Instilled a solution consisting of 8ml of Sodium Bicarb, 2 ml Lidocaine and 1 ml of Heparin. The catheter was then removed. Patient tolerated well, no complications were noted.   Performed by: Michiel Cowboy, PA-C   Follow up/ Additional Notes: He is not finding significant relief as of yet with the installations.  He does have a history of BPH and is experiencing symptoms of urinary hesitancy, straining so hard sometimes he has a bowel movement.  He is taking tamsulosin 0.4 mg daily.  He is not a good surgical candidate.  We discussed starting finasteride 5 mg daily to see if we can decrease the size of his prostate.  I have sent the prescription into the pharmacy.

## 2022-08-16 ENCOUNTER — Other Ambulatory Visit: Payer: Self-pay

## 2022-08-16 ENCOUNTER — Ambulatory Visit: Payer: Medicare HMO | Admitting: Urology

## 2022-08-16 ENCOUNTER — Ambulatory Visit (INDEPENDENT_AMBULATORY_CARE_PROVIDER_SITE_OTHER): Payer: Medicare HMO | Admitting: Urology

## 2022-08-16 VITALS — BP 117/65 | HR 72 | Ht 74.0 in | Wt 160.0 lb

## 2022-08-16 DIAGNOSIS — R399 Unspecified symptoms and signs involving the genitourinary system: Secondary | ICD-10-CM

## 2022-08-16 DIAGNOSIS — N401 Enlarged prostate with lower urinary tract symptoms: Secondary | ICD-10-CM

## 2022-08-16 DIAGNOSIS — R3911 Hesitancy of micturition: Secondary | ICD-10-CM | POA: Diagnosis not present

## 2022-08-16 MED ORDER — HEPARIN SODIUM (PORCINE) 5000 UNIT/ML IJ SOLN
11.0000 mL | Freq: Once | INTRAMUSCULAR | Status: AC
Start: 2022-08-16 — End: 2022-08-11
  Administered 2022-08-11: 11 mL

## 2022-08-16 MED ORDER — SODIUM BICARBONATE 8.4 % IV SOLN
11.0000 mL | Freq: Once | INTRAVENOUS | Status: AC
Start: 2022-08-16 — End: 2022-08-16
  Administered 2022-08-16: 11 mL

## 2022-08-16 MED ORDER — FINASTERIDE 5 MG PO TABS
5.0000 mg | ORAL_TABLET | Freq: Every day | ORAL | 3 refills | Status: DC
Start: 2022-08-16 — End: 2023-05-19

## 2022-08-16 MED ORDER — LIDOCAINE HCL 1 % IJ SOLN
11.0000 mL | Freq: Once | INTRAMUSCULAR | Status: AC
Start: 2022-08-16 — End: 2022-08-11
  Administered 2022-08-11: 11 mL

## 2022-08-16 NOTE — Addendum Note (Signed)
Addended by: Marchelle Folks on: 08/16/2022 11:42 AM   Modules accepted: Orders

## 2022-08-16 NOTE — Addendum Note (Signed)
Addended by: Marchelle Folks on: 08/16/2022 09:09 AM   Modules accepted: Orders

## 2022-08-16 NOTE — Addendum Note (Signed)
Addended by: Tilden Dome on: 08/16/2022 11:18 AM   Modules accepted: Orders

## 2022-08-17 NOTE — Progress Notes (Signed)
Bladder Rescue Solution Instillation  Due to pelvic pain patient is present today for a Rescue Solution Treatment.  Patient was cleaned and prepped in a sterile fashion with betadine and lidocaine 2% jelly was instilled into the urethra.  A 14 FR catheter was inserted, urine return was noted 50 ml, urine was dark yellow in color.  Instilled a solution consisting of 8ml of Sodium Bicarb, 2 ml Lidocaine and 1 ml of Heparin. The catheter was then removed. Patient tolerated well, no complications were noted.   Performed by: Michiel Cowboy, PA-C   Follow up/ Additional Notes: Return on Tuesday for rescue solution

## 2022-08-18 ENCOUNTER — Ambulatory Visit (INDEPENDENT_AMBULATORY_CARE_PROVIDER_SITE_OTHER): Payer: Medicare HMO | Admitting: Urology

## 2022-08-18 VITALS — BP 104/62 | HR 50 | Ht 74.0 in | Wt 160.0 lb

## 2022-08-18 DIAGNOSIS — R102 Pelvic and perineal pain: Secondary | ICD-10-CM | POA: Diagnosis not present

## 2022-08-18 DIAGNOSIS — L0291 Cutaneous abscess, unspecified: Secondary | ICD-10-CM | POA: Diagnosis not present

## 2022-08-18 DIAGNOSIS — Z6821 Body mass index (BMI) 21.0-21.9, adult: Secondary | ICD-10-CM | POA: Diagnosis not present

## 2022-08-18 DIAGNOSIS — R399 Unspecified symptoms and signs involving the genitourinary system: Secondary | ICD-10-CM

## 2022-08-18 MED ORDER — HEPARIN SODIUM (PORCINE) 5000 UNIT/ML IJ SOLN
11.0000 mL | Freq: Once | INTRAMUSCULAR | Status: AC
Start: 2022-08-18 — End: 2022-08-18
  Administered 2022-08-18: 11 mL

## 2022-08-22 NOTE — Progress Notes (Unsigned)
 Bladder Rescue Solution Instillation  Due to *** patient is present today for a Rescue Solution Treatment.  Patient was cleaned and prepped in a sterile fashion with betadine and lidocaine 2% jelly was instilled into the urethra.  A *** FR catheter was inserted, urine return was noted ***ml, urine was *** in color.  Instilled a solution consisting of 66m of Sodium Bicarb, 2 ml Lidocaine and 1 ml of Heparin. The catheter was then removed. Patient tolerated well, {dnt complications:[id number]}.   Performed by: ***  Follow up/ Additional Notes: ***

## 2022-08-23 ENCOUNTER — Ambulatory Visit: Payer: Medicare HMO | Admitting: Urology

## 2022-08-23 ENCOUNTER — Encounter: Payer: Self-pay | Admitting: Urology

## 2022-08-23 VITALS — BP 127/60 | HR 52 | Ht 74.0 in

## 2022-08-23 DIAGNOSIS — R399 Unspecified symptoms and signs involving the genitourinary system: Secondary | ICD-10-CM

## 2022-08-23 MED ORDER — SODIUM BICARBONATE 8.4 % IV SOLN
11.0000 mL | Freq: Once | INTRAVENOUS | Status: AC
Start: 2022-08-23 — End: 2022-08-23
  Administered 2022-08-23: 11 mL

## 2022-08-23 MED ORDER — URIBEL 118 MG PO CAPS: 1.0000 | ORAL_CAPSULE | Freq: Four times a day (QID) | ORAL | 0 refills | Status: DC | PRN

## 2022-08-24 NOTE — Progress Notes (Signed)
08/25/2022 12:08 PM   [name] Jul 02, 1940 [id number]  Referring provider: [facility] [address] [place],  [place] [address]  Urological history: 1.  Nephrolithiasis -left URS (09/2020)  -CT (02/2022) - 1 mm right upper pole stone, 10 mm left lower pole stone w/ 2 mm and 3 mm left renal stones   2. rUTI's -contributing factors of age, constipation and inadequate bladder emptying -documented urine cultures over the last year  06/11/12024 - MDRO E.coli           05/06/2022 ESBL E.coli           12/29/2021 MDRO E.coli           11/24/2021 MDRO E.coli           11/02/2021 MDRO E.coli           09/30/2021 MDRO E.coli  3. BPH with LU TS -PSA (2021)- 4.35 -prostate volume (2023) - 100 cc  -cysto (08/2021) - high bladder neck  Chief Complaint  Patient presents with   Advice Only   HPI: [name] Browning is a 82 y.o. male who presents today for follow up.   Previous records reviewed.   He has had a longstanding history of pelvic pain.  He has been tried on p.o. antibiotics and gentamicin bladder installations without relief of his symptoms.  We also tried a short course of bladder rescue solutions which were also ineffective.  The only relief he seems to experience is after he takes his diuretic and has a fair volume of urine expelled he can experience relief for about 3 hours.  I recently had placed him on finasteride, but he has not been on that medication long enough to see any benefit.  I prescribed him Uribel.  He states the Ninfa Linden is working for him, but it is cost prohibitive.    PMH: Past Medical History:  Diagnosis Date   Anginal pain (HCC)    Anxiety    Aortic atherosclerosis (HCC)    Atrial fibrillation (HCC) 01/2019   Bilateral carpal tunnel syndrome    CAD (coronary artery disease)    a.) LHC 2004 --> normal coronaries. b.) normal stress test in 2007 and 2011; c.) Lexiscan 05/20/2014 --> LVEF 55-65%; no significant stress induced ischemia/arrythmia. d.)  CT chest 03/25/2019 --> coronaries carcified.   Carotid atherosclerosis, bilateral    Carpal tunnel syndrome, bilateral    CHF (congestive heart failure) (HCC)    Chronic anticoagulation    a.) ASA + apixaban   COPD (chronic obstructive pulmonary disease) (HCC)    CVA (cerebral vascular accident) (HCC)    Degenerative disc disease, cervical    Diastolic dysfunction    a.) TTE 05/29/2014 --> LVEF 60-65%; G1DD.   DVT (deep venous thrombosis) (HCC)    GERD (gastroesophageal reflux disease)    History of 2019 novel coronavirus disease (COVID-19) 02/08/2019   HLD (hyperlipidemia)    Hypertension    Kidney stones    Osteoarthritis of right shoulder    Pneumonia    Respiratory failure, acute (HCC) 09/20/2020   a.) severe respiratory distress 1 hour after urological surgery. CXR (+) for acute pulmonary edema. Transferred to ICU and placed on NIPPV. Questionable aspiration PNA. (+) A.fib with RVR. Improved with ABX, diuresis, and amiodarone.   Skin cancer of face    a.) RIGHT ear and RIGHT forehead; excised.   Syncope    TIA (transient ischemic attack) 2016   Valvular regurgitation    a.) TTE  05/26/2014 --> LVEF 60-65%; trivial MR, mild TR; no AR or PR. b.) TTE 04/20/2015 --> LVEF 55-60%; trivial MR and PR; no AR or TR.    Surgical History: Past Surgical History:  Procedure Laterality Date   CARDIAC CATHETERIZATION  2004   CARPAL TUNNEL RELEASE Right 06/11/2013   CENTRAL LINE INSERTION  11/14/2020   Procedure: CENTRAL LINE INSERTION;  Surgeon: Marykay Lex, MD;  Location: [facility] INVASIVE CV LAB;  Service: Cardiovascular;;   CORONARY/GRAFT ACUTE MI REVASCULARIZATION N/A 11/14/2020   Procedure: Coronary/Graft Acute MI Revascularization;  Surgeon: Marykay Lex, MD;  Location: [facility] INVASIVE CV LAB;  Service: Cardiovascular;  Laterality: N/A;   CYSTOSCOPY W/ RETROGRADES  10/16/2020   Procedure: CYSTOSCOPY WITH RETROGRADE PYELOGRAM;  Surgeon: Sondra Come, MD;  Location: [facility] ORS;   Service: Urology;;   CYSTOSCOPY W/ URETERAL STENT PLACEMENT Left 09/20/2020   Procedure: CYSTOSCOPY WITH RETROGRADE PYELOGRAM/URETERAL STENT PLACEMENT;  Surgeon: Jannifer Hick, MD;  Location: [facility] ORS;  Service: Urology;  Laterality: Left;   CYSTOSCOPY/URETEROSCOPY/HOLMIUM LASER/STENT PLACEMENT Left 10/16/2020   Procedure: CYSTOSCOPY/URETEROSCOPY/HOLMIUM LASER/STENT PLACEMENT;  Surgeon: Sondra Come, MD;  Location: [facility] OR[facility];  Service: Urology;  Laterality: Left;   ESOPHAGOGASTRODUODENOSCOPY N/A 11/06/2020   Procedure: ESOPHAGOGASTRODUODENOSCOPY (EGD);  Surgeon: Midge Minium, MD;  Location: [facility] ENDOSCOPY;  Service: Endoscopy;  Laterality: N/A;   LEFT HEART CATH AND CORONARY ANGIOGRAPHY N/A 11/14/2020   Procedure: LEFT HEART CATH AND CORONARY ANGIOGRAPHY;  Surgeon: Marykay Lex, MD;  Location: [facility];  Service: Cardiovascular;  Laterality: N/A;   LEFT HEART CATH AND CORONARY ANGIOGRAPHY Left 01/28/2022   Procedure: LEFT HEART CATH AND CORONARY ANGIOGRAPHY;  Surgeon: Laurier Nancy, MD;  Location: [facility] INV[facility]IVE CV [facility];  Service: Cardiovascular;  Laterality: Left;   SHOULDER ARTHROSCOPY WITH OPEN ROTATOR CUFF REPAIR Right 08/24/2017   Procedure: SHOULDER ARTHROSCOPY WITH OPEN ROTATOR CUFF REPAIR;  Surgeon: Christena Flake, MD;  Location: [facility] ORS;  Service: Orthopedics;  Laterality: Right;   SKIN CANCER EXCISION  12/01/2016   right ear    SKIN CANCER EXCISION     remove from the right side of the face    THROMBECTOMY Right 2004   leg   THYROIDECTOMY  1950   Not sure if total or partial thyroidectomy.    Home Medications:  Allergies as of 08/25/2022       Reactions   Hydromorphone    Hallucination Pt reports he doesn't know about this        Medication List        Accurate as of August 25, 2022 11:59 PM. If you have any questions, ask your nurse or doctor.          acetaminophen 500 MG tablet Commonly known as: TYLENOL Take 2 tablets (1,000 mg total) by mouth  every 6 (six) hours as needed for mild pain.   aspirin EC 81 MG tablet Take 81 mg by mouth daily after supper.   atorvastatin 40 MG tablet Commonly known as: LIPITOR Take 40 mg by mouth daily after supper.   cetirizine 10 MG tablet Commonly known as: ZYRTEC Take 10 mg by mouth daily.   clopidogrel 75 MG tablet Commonly known as: PLAVIX TAKE 1 TABLET BY MOUTH EVERY DAY   famotidine 20 MG tablet Commonly known as: PEPCID TAKE 1 TABLET BY MOUTH AFTER SUPPER   ferrous gluconate 240 (27 FE) MG tablet Commonly known as: FERGON Take 240 mg by mouth 3 (three) times daily with meals.  finasteride 5 MG tablet Commonly known as: PROSCAR Take 1 tablet (5 mg total) by mouth daily.   hyoscyamine 0.125 MG Tbdp disintergrating tablet Commonly known as: ANASPAZ Place 1 tablet (0.125 mg total) under the tongue every 6 (six) hours as needed. Started by: Michiel Cowboy   isosorbide mononitrate 30 MG 24 hr tablet Commonly known as: IMDUR TAKE 1 TABLET BY MOUTH EVERY DAY   Klor-Con M20 20 MEQ tablet Generic drug: potassium chloride SA TAKE 1 TABLET BY MOUTH EVERY DAY   losartan 50 MG tablet Commonly known as: COZAAR TAKE 1 TABLET BY MOUTH EVERY DAY   methenamine 1 g tablet Commonly known as: Hiprex Take 1 tablet (1 g total) by mouth 2 (two) times daily with a meal.   metoprolol succinate 50 MG 24 hr tablet Commonly known as: TOPROL-XL TAKE 1 TABLET BY MOUTH EVERY DAY   montelukast 10 MG tablet Commonly known as: SINGULAIR Take 10 mg by mouth at bedtime.   nitrofurantoin (macrocrystal-monohydrate) 100 MG capsule Commonly known as: MACROBID Take 1 capsule (100 mg total) by mouth every 12 (twelve) hours.   ondansetron 4 MG disintegrating tablet Commonly known as: ZOFRAN-ODT Take 1 tablet (4 mg total) by mouth every 6 (six) hours as needed for nausea or vomiting.   pantoprazole 40 MG tablet Commonly known as: PROTONIX TAKE 1 TABLET BY MOUTH TWICE A DAY   ranolazine 1000  MG SR tablet Commonly known as: RANEXA TAKE 1 TABLET BY MOUTH TWICE A DAY   roflumilast 500 MCG Tabs tablet Commonly known as: DALIRESP Take 500 mcg by mouth daily.   tamsulosin 0.4 MG Caps capsule Commonly known as: FLOMAX Take 1 capsule (0.4 mg total) by mouth daily.   torsemide 20 MG tablet Commonly known as: DEMADEX TAKE 3 TABLETS BY MOUTH EVERY DAY   Uribel 118 MG Caps Take 1 capsule (118 mg total) by mouth every 6 (six) hours as needed.        Allergies:  Allergies  Allergen Reactions   Hydromorphone     Hallucination Pt reports he doesn't know about this    Family History: Family History  Problem Relation Age of Onset   Hypertension Mother    Heart disease Mother    CAD Father    Heart attack Father     Social History:  reports that he quit smoking about 19 years ago. His smoking use included cigarettes. He started smoking about 65 years ago. He has a 46.1 pack-year smoking history. He has been exposed to tobacco smoke. He quit smokeless tobacco use about 19 years ago.  His smokeless tobacco use included snuff. He reports that he does not currently use alcohol after a past usage of about 7.0 standard drinks of alcohol per week. He reports that he does not use drugs.  ROS: Pertinent ROS in HPI  Physical Exam: BP (!) 100/58   Pulse 71   Constitutional:  Well nourished. Alert and oriented, No acute distress. HEENT: [place] AT, moist mucus membranes.  Trachea midline Cardiovascular: No clubbing, cyanosis, or edema. Respiratory: Normal respiratory effort, no increased work of breathing. Neurologic: Grossly intact, no focal deficits, moving all 4 extremities. Psychiatric: Normal mood and affect.  Laboratory Data: Lab Results  Component Value Date   WBC 6.9 07/03/2022   HGB 13.8 07/03/2022   HCT 43.4 07/03/2022   MCV 91.4 07/03/2022   PLT 204 07/03/2022    Lab Results  Component Value Date   CREATININE 1.19 07/03/2022    Lab Results  Component Value  Date   AST 26 05/06/2022   Lab Results  Component Value Date   ALT 12 05/06/2022   Urinalysis    Component Value Date/Time   COLORURINE AMBER (A) 05/06/2022 1943   APPEARANCEUR Hazy (A) 06/21/2022 1328   LABSPEC 1.014 05/06/2022 1943   LABSPEC 1.014 08/10/2013 1825   PHURINE 8.0 05/06/2022 1943   GLUCOSEU Negative 06/21/2022 1328   GLUCOSEU Negative 08/10/2013 1825   HGBUR NEGATIVE 05/06/2022 1943   BILIRUBINUR Negative 06/21/2022 1328   BILIRUBINUR 1+ 08/10/2013 1825   KETONESUR NEGATIVE 05/06/2022 1943   PROTEINUR Negative 06/21/2022 1328   PROTEINUR 100 (A) 05/06/2022 1943   NITRITE Negative 06/21/2022 1328   NITRITE POSITIVE (A) 05/06/2022 1943   LEUKOCYTESUR 2+ (A) 06/21/2022 1328   LEUKOCYTESUR MODERATE (A) 05/06/2022 1943   LEUKOCYTESUR Negative 08/10/2013 1825  I have reviewed the labs.   Pertinent Imaging: N/A  Assessment & Plan:    1. Pelvic pain -Has had some relief with the Uribel, he will continue the medication for now -His insurance will not cover the medication, so have sent a prescription in for Levsin sublingual tablets, but his insurance will require a prior authorization and if we are able to achieve that and the medicine is not cost prohibitive we will see if that provides relief for him  Return for keep follow up with Dr. Richardo Hanks in October .  These notes generated with voice recognition software. I apologize for typographical errors.  Cloretta Ned  [facility] [address]  [address] [place], [place] [address] [phone]

## 2022-08-25 ENCOUNTER — Encounter: Payer: Self-pay | Admitting: Urology

## 2022-08-25 ENCOUNTER — Ambulatory Visit (INDEPENDENT_AMBULATORY_CARE_PROVIDER_SITE_OTHER): Payer: Medicare HMO | Admitting: Urology

## 2022-08-25 VITALS — BP 100/58 | HR 71

## 2022-08-25 DIAGNOSIS — R102 Pelvic and perineal pain: Secondary | ICD-10-CM | POA: Diagnosis not present

## 2022-08-25 MED ORDER — HYOSCYAMINE SULFATE 0.125 MG PO TBDP
0.1250 mg | ORAL_TABLET | Freq: Four times a day (QID) | ORAL | 0 refills | Status: DC | PRN
Start: 2022-08-25 — End: 2023-05-19

## 2022-08-30 ENCOUNTER — Ambulatory Visit: Payer: Medicare HMO | Admitting: Urology

## 2022-09-01 ENCOUNTER — Other Ambulatory Visit: Payer: Self-pay | Admitting: Cardiovascular Disease

## 2022-09-01 ENCOUNTER — Ambulatory Visit: Payer: Medicare HMO | Admitting: Urology

## 2022-09-01 DIAGNOSIS — I2510 Atherosclerotic heart disease of native coronary artery without angina pectoris: Secondary | ICD-10-CM

## 2022-09-03 ENCOUNTER — Telehealth: Payer: Self-pay | Admitting: Urology

## 2022-09-03 NOTE — Telephone Encounter (Signed)
Would you see if [name] Browning was able to fill his Levsin prescription?  And if so, is it as helpful as the Uribel in controlling his symptoms?

## 2022-09-05 NOTE — Telephone Encounter (Signed)
Spoke with patient and he did not pick up Levsin it was $38.00 and he said it's not going to work either. Per pt "nothing is working"

## 2022-09-09 ENCOUNTER — Other Ambulatory Visit: Payer: Self-pay | Admitting: Urology

## 2022-09-09 DIAGNOSIS — N411 Chronic prostatitis: Secondary | ICD-10-CM

## 2022-09-09 NOTE — Progress Notes (Signed)
I spoke with [name] Browning regarding my conversation with Dr. Richardo Hanks.  Unfortunately, we have reached the end of our treatment algorithm.  I offered to prescribe amitriptyline as we have not tried that medication and after some discussion he would like to be referred to another urologist for a second opinion.  He would like to be seen at [facility] in [place].  I have placed the referral.

## 2022-09-15 ENCOUNTER — Other Ambulatory Visit: Payer: Self-pay | Admitting: Family

## 2022-09-15 ENCOUNTER — Other Ambulatory Visit: Payer: Self-pay | Admitting: Internal Medicine

## 2022-09-21 DIAGNOSIS — R3911 Hesitancy of micturition: Secondary | ICD-10-CM | POA: Diagnosis not present

## 2022-09-21 DIAGNOSIS — R300 Dysuria: Secondary | ICD-10-CM | POA: Diagnosis not present

## 2022-09-21 DIAGNOSIS — R351 Nocturia: Secondary | ICD-10-CM | POA: Diagnosis not present

## 2022-09-21 DIAGNOSIS — N401 Enlarged prostate with lower urinary tract symptoms: Secondary | ICD-10-CM | POA: Diagnosis not present

## 2022-09-27 DIAGNOSIS — H2511 Age-related nuclear cataract, right eye: Secondary | ICD-10-CM | POA: Diagnosis not present

## 2022-09-27 DIAGNOSIS — H2513 Age-related nuclear cataract, bilateral: Secondary | ICD-10-CM | POA: Diagnosis not present

## 2022-09-27 DIAGNOSIS — H18413 Arcus senilis, bilateral: Secondary | ICD-10-CM | POA: Diagnosis not present

## 2022-09-27 DIAGNOSIS — H25043 Posterior subcapsular polar age-related cataract, bilateral: Secondary | ICD-10-CM | POA: Diagnosis not present

## 2022-09-27 DIAGNOSIS — H25013 Cortical age-related cataract, bilateral: Secondary | ICD-10-CM | POA: Diagnosis not present

## 2022-10-10 ENCOUNTER — Telehealth (INDEPENDENT_AMBULATORY_CARE_PROVIDER_SITE_OTHER): Payer: Medicare HMO | Admitting: Nurse Practitioner

## 2022-10-10 ENCOUNTER — Encounter: Payer: Self-pay | Admitting: Nurse Practitioner

## 2022-10-10 DIAGNOSIS — J4489 Other specified chronic obstructive pulmonary disease: Secondary | ICD-10-CM | POA: Diagnosis not present

## 2022-10-10 DIAGNOSIS — J9611 Chronic respiratory failure with hypoxia: Secondary | ICD-10-CM | POA: Diagnosis not present

## 2022-10-10 DIAGNOSIS — I5032 Chronic diastolic (congestive) heart failure: Secondary | ICD-10-CM | POA: Diagnosis not present

## 2022-10-10 DIAGNOSIS — J439 Emphysema, unspecified: Secondary | ICD-10-CM

## 2022-10-10 DIAGNOSIS — J42 Unspecified chronic bronchitis: Secondary | ICD-10-CM

## 2022-10-10 MED ORDER — BREZTRI AEROSPHERE 160-9-4.8 MCG/ACT IN AERO: 2.0000 | INHALATION_SPRAY | Freq: Two times a day (BID) | RESPIRATORY_TRACT | 12 refills | Status: DC

## 2022-10-10 NOTE — Assessment & Plan Note (Signed)
No current O2 use due to lack of equipment. Will have him complete ONO on room air to determine nocturnal requirements. Plan to walk at follow up for exertional requirements. Advised to monitor at home for goal >88-90%

## 2022-10-10 NOTE — Assessment & Plan Note (Signed)
Euvolemic per his report. Advised to monitor weights at home and notify cardiology of 2-3 weight gain overnight or 5 lb in a week. Follow up with cardiology as scheduled.

## 2022-10-10 NOTE — Assessment & Plan Note (Signed)
COPD with high symptom burden. He is poorly controlled. Does not sound like he is in acute exacerbation. Discussed the importance of compliance with maintenance therapies. He will resume Breztri, which he has received benefit from previously. Understands proper use. Encouraged to work on graded exercises. Action plan in place.  Patient Instructions  Restart Breztri 2 puffs Twice daily. Brush tongue and rinse mouth afterwards Continue Albuterol inhaler 2 puffs every 6 hours as needed for shortness of breath or wheezing. Notify if symptoms persist despite rescue inhaler/neb use.  Continue zyrtec 1 tab daily Continue daliresp 1 tab daily Continue singulair 1 tab At bedtime   Overnight oxygen study on room air - someone will contact you to schedule this  We will walk you when you come to the office next to see about your oxygen requirements with activity and at rest. Monitor at home for goal above 88-90%  Follow up in 6 weeks with Dr. Belia Heman or APP in [place]. If symptoms do not improve or worsen, please contact office for sooner follow up or seek emergency care.

## 2022-10-10 NOTE — Patient Instructions (Signed)
Restart Breztri 2 puffs Twice daily. Brush tongue and rinse mouth afterwards Continue Albuterol inhaler 2 puffs every 6 hours as needed for shortness of breath or wheezing. Notify if symptoms persist despite rescue inhaler/neb use.  Continue zyrtec 1 tab daily Continue daliresp 1 tab daily Continue singulair 1 tab At bedtime   Overnight oxygen study on room air - someone will contact you to schedule this  We will walk you when you come to the office next to see about your oxygen requirements with activity and at rest. Monitor at home for goal above 88-90%  Follow up in 6 weeks with Dr. Belia Heman or APP in [place]. If symptoms do not improve or worsen, please contact office for sooner follow up or seek emergency care.

## 2022-10-10 NOTE — Progress Notes (Signed)
Patient ID: [name] Browning, male     DOB: 04/13/1940, 82 y.o.      MRN: [id number]  Chief Complaint  Patient presents with   Follow-up    Pt is here for F/U visit. Pt states he has no complaints today.    Virtual Visit via Video Note  I connected with [name] Browning on 10/10/22 at 10:00 AM EDT by a video enabled telemedicine application and verified that I am speaking with the correct person using two identifiers.  Location: Patient: Home Provider: Office   I discussed the limitations of evaluation and management by telemedicine and the availability of in person appointments. The patient expressed understanding and agreed to proceed.  History of Present Illness: 82 year old male, former smoker followed for COPD with chronic bronchitis and emphysema and chronic respiratory failure on supplemental O2. He is a patient of Dr. Clovis Fredrickson and last seen in office 06/08/2021. Past medical history significant for HTN, PAF, CHF, hx of DVT, hx of STEMI, allergic rhinitis, GERD, CKD, BPH, HLD.  TESTS/EVENTS: 11/15/2020 echo: EF 60 to 65%.  G1 DD.  RV function severely reduced and enlarged.  LA severely dilated.  RA severely dilated.  Mild MR. 07/03/2022 CTA chest: No PE.  Atherosclerosis/CAD.  Cardiomegaly.  Emphysema.  Nodular biapical scarring, unchanged.  Mild bilateral gynecomastia  06/09/2022: OV with Dr. Belia Heman. Significant SOB/DOE. Chronic productive cough/chest congestion over the last 8 months. Several hospitalizations over the last year. Progressive respiratory disease. Chronic hypoxic respiratory failure; currently on 5 lpm. COPD progressed to end stage with previous infections and hospitalizations. Started on [employer] inhaler previously. Was also given 20 mg daily at last OV. Started on daliresp. Recommended pulmonary rehab.   10/10/2022: Today - follow up Patient presents today for follow up via virtual visit. He still has trouble with his breathing. Gets short winded with longer distances or more  strenuous activity. Not doing much around the house. He has been out of his maintenance inhaler for quite some time. Uses albuterol a few times a week. He's still on daliresp and singulair. He does have a daily productive cough with brown phlegm that has been present since June 2024 when he was seen in the ED. CTA chest was negative for acute process. Usually feels better after he clears the phlegm. Not noticing much wheezing. No fevers, chills, hemoptysis, weight gain/loss, leg swelling, orthopnea. He is no longer wearing his oxygen. Home unit is not working. Not monitoring O2 sats at home. Last OV with cardiology 7/19 showed sats 96% on room air.   Allergies  Allergen Reactions   Hydromorphone     Hallucination Pt reports he doesn't know about this   Immunization History  Administered Date(s) Administered   Fluad Quad(high Dose 65+) 09/19/2020   Influenza, High Dose Seasonal PF 09/01/2015, 10/15/2016   Influenza-Unspecified 10/15/2016   PFIZER(Purple Top)SARS-COV-2 Vaccination 06/08/2019, 06/29/2019   Pneumococcal Conjugate-13 02/22/2016   Pneumococcal Polysaccharide-23 03/09/2017   Past Medical History:  Diagnosis Date   Anginal pain (HCC)    Anxiety    Aortic atherosclerosis (HCC)    Atrial fibrillation (HCC) 01/2019   Bilateral carpal tunnel syndrome    CAD (coronary artery disease)    a.) LHC 2004 --> normal coronaries. b.) normal stress test in 2007 and 2011; c.) Lexiscan 05/20/2014 --> LVEF 55-65%; no significant stress induced ischemia/arrythmia. d.) CT chest 03/25/2019 --> coronaries carcified.   Carotid atherosclerosis, bilateral    Carpal tunnel syndrome, bilateral    CHF (congestive  heart failure) (HCC)    Chronic anticoagulation    a.) ASA + apixaban   COPD (chronic obstructive pulmonary disease) (HCC)    CVA (cerebral vascular accident) (HCC)    Degenerative disc disease, cervical    Diastolic dysfunction    a.) TTE 05/29/2014 --> LVEF 60-65%; G1DD.   DVT (deep  venous thrombosis) (HCC)    GERD (gastroesophageal reflux disease)    History of 2019 novel coronavirus disease (COVID-19) 02/08/2019   HLD (hyperlipidemia)    Hypertension    Kidney stones    Osteoarthritis of right shoulder    Pneumonia    Respiratory failure, acute (HCC) 09/20/2020   a.) severe respiratory distress 1 hour after urological surgery. CXR (+) for acute pulmonary edema. Transferred to ICU and placed on NIPPV. Questionable aspiration PNA. (+) A.fib with RVR. Improved with ABX, diuresis, and amiodarone.   Skin cancer of face    a.) RIGHT ear and RIGHT forehead; excised.   Syncope    TIA (transient ischemic attack) 2016   Valvular regurgitation    a.) TTE 05/26/2014 --> LVEF 60-65%; trivial MR, mild TR; no AR or PR. b.) TTE 04/20/2015 --> LVEF 55-60%; trivial MR and PR; no AR or TR.    Tobacco History: Social History   Tobacco Use  Smoking Status Former   Current packs/day: 0.00   Average packs/day: 1 pack/day for 46.1 years (46.1 ttl pk-yrs)   Types: Cigarettes   Start date: 01/10/1957   Quit date: 02/11/2003   Years since quitting: 19.6   Passive exposure: Past  Smokeless Tobacco Former   Types: Snuff   Quit date: 02/11/2003   Counseling given: Not Answered   Outpatient Medications Prior to Visit  Medication Sig Dispense Refill   acetaminophen (TYLENOL) 500 MG tablet Take 2 tablets (1,000 mg total) by mouth every 6 (six) hours as needed for mild pain.     aspirin EC 81 MG tablet Take 81 mg by mouth daily after supper.     atorvastatin (LIPITOR) 40 MG tablet Take 40 mg by mouth daily after supper.     cetirizine (ZYRTEC) 10 MG tablet Take 10 mg by mouth daily.     clopidogrel (PLAVIX) 75 MG tablet TAKE 1 TABLET BY MOUTH EVERY DAY 90 tablet 3   famotidine (PEPCID) 20 MG tablet TAKE 1 TABLET BY MOUTH AFTER SUPPER 90 tablet 3   ferrous gluconate (FERGON) 240 (27 FE) MG tablet Take 240 mg by mouth 3 (three) times daily with meals.     finasteride (PROSCAR) 5 MG tablet  Take 1 tablet (5 mg total) by mouth daily. 90 tablet 3   hyoscyamine (ANASPAZ) 0.125 MG TBDP disintergrating tablet Place 1 tablet (0.125 mg total) under the tongue every 6 (six) hours as needed. 120 tablet 0   isosorbide mononitrate (IMDUR) 30 MG 24 hr tablet TAKE 1 TABLET BY MOUTH EVERY DAY 90 tablet 2   KLOR-CON M20 20 MEQ tablet TAKE 1 TABLET BY MOUTH EVERY DAY 90 tablet 3   losartan (COZAAR) 50 MG tablet TAKE 1 TABLET BY MOUTH EVERY DAY 90 tablet 1   Meth-Hyo-M Bl-Na Phos-Ph Sal (URIBEL) 118 MG CAPS Take 1 capsule (118 mg total) by mouth every 6 (six) hours as needed. 30 capsule 0   methenamine (HIPREX) 1 g tablet Take 1 tablet (1 g total) by mouth 2 (two) times daily with a meal. 60 tablet 3   metoprolol succinate (TOPROL-XL) 50 MG 24 hr tablet TAKE 1 TABLET BY MOUTH EVERY DAY  90 tablet 1   montelukast (SINGULAIR) 10 MG tablet Take 10 mg by mouth at bedtime.     nitrofurantoin, macrocrystal-monohydrate, (MACROBID) 100 MG capsule Take 1 capsule (100 mg total) by mouth every 12 (twelve) hours. 14 capsule 0   ondansetron (ZOFRAN-ODT) 4 MG disintegrating tablet Take 1 tablet (4 mg total) by mouth every 6 (six) hours as needed for nausea or vomiting. 20 tablet 0   pantoprazole (PROTONIX) 40 MG tablet TAKE 1 TABLET BY MOUTH TWICE A DAY 180 tablet 1   ranolazine (RANEXA) 1000 MG SR tablet TAKE 1 TABLET BY MOUTH TWICE A DAY 180 tablet 1   roflumilast (DALIRESP) 500 MCG TABS tablet Take 500 mcg by mouth daily.     tamsulosin (FLOMAX) 0.4 MG CAPS capsule Take 1 capsule (0.4 mg total) by mouth daily. 30 capsule 11   torsemide (DEMADEX) 20 MG tablet TAKE 3 TABLETS BY MOUTH EVERY DAY 270 tablet 3   Facility-Administered Medications Prior to Visit  Medication Dose Route Frequency Provider Last Rate Last Admin   sodium chloride flush (NS) 0.9 % injection 3 mL  3 mL Intravenous Q12H Scoggins, Amber, NP         Review of Systems:   Constitutional: No weight loss or gain, night sweats, fevers, chills, or  lassitude. +fatigue (Baseline) HEENT: No headaches, difficulty swallowing, tooth/dental problems, or sore throat. No sneezing, itching, ear ache, nasal congestion, or post nasal drip CV:  No chest pain, orthopnea, PND, swelling in lower extremities, anasarca, dizziness, palpitations, syncope Resp: +shortness of breath with exertion; productive cough. No excess mucus or change in color of mucus. No hemoptysis. No wheezing.  No chest wall deformity GI:  No heartburn, indigestion, abdominal pain, nausea, vomiting, diarrhea, change in bowel habits, loss of appetite, bloody stools.  Skin: No rash, lesions, ulcerations MSK:  No joint pain or swelling.   Neuro: No dizziness or lightheadedness.  Psych: No depression or anxiety. Mood stable.   Observations/Objective: Unable to visualize patient due to technical difficulties on patient's end.  A&Ox3. Unlabored breathing. Speech is clear and coherent with logical content.    Assessment and Plan: Chronic obstructive pulmonary disease (COPD) (HCC) COPD with high symptom burden. He is poorly controlled. Does not sound like he is in acute exacerbation. Discussed the importance of compliance with maintenance therapies. He will resume Breztri, which he has received benefit from previously. Understands proper use. Encouraged to work on graded exercises. Action plan in place.  Patient Instructions  Restart Breztri 2 puffs Twice daily. Brush tongue and rinse mouth afterwards Continue Albuterol inhaler 2 puffs every 6 hours as needed for shortness of breath or wheezing. Notify if symptoms persist despite rescue inhaler/neb use.  Continue zyrtec 1 tab daily Continue daliresp 1 tab daily Continue singulair 1 tab At bedtime   Overnight oxygen study on room air - someone will contact you to schedule this  We will walk you when you come to the office next to see about your oxygen requirements with activity and at rest. Monitor at home for goal above  88-90%  Follow up in 6 weeks with Dr. Belia Heman or APP in [place]. If symptoms do not improve or worsen, please contact office for sooner follow up or seek emergency care.    Chronic respiratory failure with hypoxia (HCC) No current O2 use due to lack of equipment. Will have him complete ONO on room air to determine nocturnal requirements. Plan to walk at follow up for exertional requirements. Advised to monitor at  home for goal >88-90%    I discussed the assessment and treatment plan with the patient. The patient was provided an opportunity to ask questions and all were answered. The patient agreed with the plan and demonstrated an understanding of the instructions.   The patient was advised to call back or seek an in-person evaluation if the symptoms worsen or if the condition fails to improve as anticipated.  I provided 32 minutes of non-face-to-face time during this encounter.   Noemi Chapel, NP

## 2022-10-25 ENCOUNTER — Other Ambulatory Visit: Payer: Self-pay | Admitting: Cardiovascular Disease

## 2022-10-25 ENCOUNTER — Encounter: Payer: Self-pay | Admitting: Cardiovascular Disease

## 2022-10-25 ENCOUNTER — Ambulatory Visit (INDEPENDENT_AMBULATORY_CARE_PROVIDER_SITE_OTHER): Payer: Medicare HMO | Admitting: Cardiovascular Disease

## 2022-10-25 VITALS — BP 125/70 | HR 60 | Ht 74.0 in | Wt 163.4 lb

## 2022-10-25 DIAGNOSIS — R0789 Other chest pain: Secondary | ICD-10-CM | POA: Diagnosis not present

## 2022-10-25 DIAGNOSIS — I5032 Chronic diastolic (congestive) heart failure: Secondary | ICD-10-CM | POA: Diagnosis not present

## 2022-10-25 DIAGNOSIS — I5033 Acute on chronic diastolic (congestive) heart failure: Secondary | ICD-10-CM | POA: Diagnosis not present

## 2022-10-25 DIAGNOSIS — Z8679 Personal history of other diseases of the circulatory system: Secondary | ICD-10-CM | POA: Diagnosis not present

## 2022-10-25 DIAGNOSIS — I10 Essential (primary) hypertension: Secondary | ICD-10-CM

## 2022-10-25 NOTE — Progress Notes (Signed)
Cardiology Office Note   Date:  10/25/2022   ID:  [name] Browning, DOB 1940-06-25, MRN [id number]  PCP:  [facility]  Cardiologist:  Adrian Blackwater, MD      History of Present Illness: [name] Browning is a 82 y.o. male who presents for  Chief Complaint  Patient presents with   Follow-up    3 mo f/u    Chest Pain  This is a new problem. The current episode started more than 1 month ago. The problem has been waxing and waning. The pain is at a severity of 4/10. The quality of the pain is described as tightness.      Past Medical History:  Diagnosis Date   Anginal pain (HCC)    Anxiety    Aortic atherosclerosis (HCC)    Atrial fibrillation (HCC) 01/2019   Bilateral carpal tunnel syndrome    CAD (coronary artery disease)    a.) LHC 2004 --> normal coronaries. b.) normal stress test in 2007 and 2011; c.) Lexiscan 05/20/2014 --> LVEF 55-65%; no significant stress induced ischemia/arrythmia. d.) CT chest 03/25/2019 --> coronaries carcified.   Carotid atherosclerosis, bilateral    Carpal tunnel syndrome, bilateral    CHF (congestive heart failure) (HCC)    Chronic anticoagulation    a.) ASA + apixaban   COPD (chronic obstructive pulmonary disease) (HCC)    CVA (cerebral vascular accident) (HCC)    Degenerative disc disease, cervical    Diastolic dysfunction    a.) TTE 05/29/2014 --> LVEF 60-65%; G1DD.   DVT (deep venous thrombosis) (HCC)    GERD (gastroesophageal reflux disease)    History of 2019 novel coronavirus disease (COVID-19) 02/08/2019   HLD (hyperlipidemia)    Hypertension    Kidney stones    Osteoarthritis of right shoulder    Pneumonia    Respiratory failure, acute (HCC) 09/20/2020   a.) severe respiratory distress 1 hour after urological surgery. CXR (+) for acute pulmonary edema. Transferred to ICU and placed on NIPPV. Questionable aspiration PNA. (+) A.fib with RVR. Improved with ABX, diuresis, and amiodarone.   Skin cancer of face    a.) RIGHT ear  and RIGHT forehead; excised.   Syncope    TIA (transient ischemic attack) 2016   Valvular regurgitation    a.) TTE 05/26/2014 --> LVEF 60-65%; trivial MR, mild TR; no AR or PR. b.) TTE 04/20/2015 --> LVEF 55-60%; trivial MR and PR; no AR or TR.     Past Surgical History:  Procedure Laterality Date   CARDIAC CATHETERIZATION  2004   CARPAL TUNNEL RELEASE Right 06/11/2013   CENTRAL LINE INSERTION  11/14/2020   Procedure: CENTRAL LINE INSERTION;  Surgeon: Marykay Lex, MD;  Location: [facility] INVASIVE CV LAB;  Service: Cardiovascular;;   CORONARY/GRAFT ACUTE MI REVASCULARIZATION N/A 11/14/2020   Procedure: Coronary/Graft Acute MI Revascularization;  Surgeon: Marykay Lex, MD;  Location: [facility] INVASIVE CV LAB;  Service: Cardiovascular;  Laterality: N/A;   CYSTOSCOPY W/ RETROGRADES  10/16/2020   Procedure: CYSTOSCOPY WITH RETROGRADE PYELOGRAM;  Surgeon: Sondra Come, MD;  Location: [facility] ORS;  Service: Urology;;   CYSTOSCOPY W/ URETERAL STENT PLACEMENT Left 09/20/2020   Procedure: CYSTOSCOPY WITH RETROGRADE PYELOGRAM/URETERAL STENT PLACEMENT;  Surgeon: Jannifer Hick, MD;  Location: [facility] ORS;  Service: Urology;  Laterality: Left;   CYSTOSCOPY/URETEROSCOPY/HOLMIUM LASER/STENT PLACEMENT Left 10/16/2020   Procedure: CYSTOSCOPY/URETEROSCOPY/HOLMIUM LASER/STENT PLACEMENT;  Surgeon: Sondra Come, MD;  Location: [facility] ORS;  Service: Urology;  Laterality: Left;   ESOPHAGOGASTRODUODENOSCOPY N/A  11/06/2020   Procedure: ESOPHAGOGASTRODUODENOSCOPY (EGD);  Surgeon: Midge Minium, MD;  Location: [facility] ENDOSCOPY;  Service: Endoscopy;  Laterality: N/A;   LEFT HEART CATH AND CORONARY ANGIOGRAPHY N/A 11/14/2020   Procedure: LEFT HEART CATH AND CORONARY ANGIOGRAPHY;  Surgeon: Marykay Lex, MD;  Location: [facility] INVASIVE CV LAB;  Service: Cardiovascular;  Laterality: N/A;   LEFT HEART CATH AND CORONARY ANGIOGRAPHY Left 01/28/2022   Procedure: LEFT HEART CATH AND CORONARY ANGIOGRAPHY;  Surgeon: Laurier Nancy, MD;   Location: [facility] INVASIVE CV LAB;  Service: Cardiovascular;  Laterality: Left;   SHOULDER ARTHROSCOPY WITH OPEN ROTATOR CUFF REPAIR Right 08/24/2017   Procedure: SHOULDER ARTHROSCOPY WITH OPEN ROTATOR CUFF REPAIR;  Surgeon: Christena Flake, MD;  Location: [facility] ORS;  Service: Orthopedics;  Laterality: Right;   SKIN CANCER EXCISION  12/01/2016   right ear    SKIN CANCER EXCISION     remove from the right side of the face    THROMBECTOMY Right 2004   leg   THYROIDECTOMY  1950   Not sure if total or partial thyroidectomy.     Current Outpatient Medications  Medication Sig Dispense Refill   acetaminophen (TYLENOL) 500 MG tablet Take 2 tablets (1,000 mg total) by mouth every 6 (six) hours as needed for mild pain.     aspirin EC 81 MG tablet Take 81 mg by mouth daily after supper.     atorvastatin (LIPITOR) 40 MG tablet Take 40 mg by mouth daily after supper.     Budeson-Glycopyrrol-Formoterol (BREZTRI AEROSPHERE) 160-9-4.8 MCG/ACT AERO Inhale 2 puffs into the lungs in the morning and at bedtime. 10.7 g 12   cetirizine (ZYRTEC) 10 MG tablet Take 10 mg by mouth daily.     clopidogrel (PLAVIX) 75 MG tablet TAKE 1 TABLET BY MOUTH EVERY DAY 90 tablet 3   famotidine (PEPCID) 20 MG tablet TAKE 1 TABLET BY MOUTH AFTER SUPPER 90 tablet 3   ferrous gluconate (FERGON) 240 (27 FE) MG tablet Take 240 mg by mouth 3 (three) times daily with meals.     finasteride (PROSCAR) 5 MG tablet Take 1 tablet (5 mg total) by mouth daily. 90 tablet 3   hyoscyamine (ANASPAZ) 0.125 MG TBDP disintergrating tablet Place 1 tablet (0.125 mg total) under the tongue every 6 (six) hours as needed. 120 tablet 0   isosorbide mononitrate (IMDUR) 30 MG 24 hr tablet TAKE 1 TABLET BY MOUTH EVERY DAY 90 tablet 2   KLOR-CON M20 20 MEQ tablet TAKE 1 TABLET BY MOUTH EVERY DAY 90 tablet 3   losartan (COZAAR) 50 MG tablet TAKE 1 TABLET BY MOUTH EVERY DAY 90 tablet 1   Meth-Hyo-M Bl-Na Phos-Ph Sal (URIBEL) 118 MG CAPS Take 1 capsule (118 mg  total) by mouth every 6 (six) hours as needed. 30 capsule 0   methenamine (HIPREX) 1 g tablet Take 1 tablet (1 g total) by mouth 2 (two) times daily with a meal. 60 tablet 3   metoprolol succinate (TOPROL-XL) 50 MG 24 hr tablet TAKE 1 TABLET BY MOUTH EVERY DAY 90 tablet 1   montelukast (SINGULAIR) 10 MG tablet Take 10 mg by mouth at bedtime.     nitrofurantoin, macrocrystal-monohydrate, (MACROBID) 100 MG capsule Take 1 capsule (100 mg total) by mouth every 12 (twelve) hours. 14 capsule 0   ondansetron (ZOFRAN-ODT) 4 MG disintegrating tablet Take 1 tablet (4 mg total) by mouth every 6 (six) hours as needed for nausea or vomiting. 20 tablet 0   pantoprazole (PROTONIX) 40 MG tablet  TAKE 1 TABLET BY MOUTH TWICE A DAY 180 tablet 1   ranolazine (RANEXA) 1000 MG SR tablet TAKE 1 TABLET BY MOUTH TWICE A DAY 180 tablet 1   roflumilast (DALIRESP) 500 MCG TABS tablet Take 500 mcg by mouth daily.     tamsulosin (FLOMAX) 0.4 MG CAPS capsule Take 1 capsule (0.4 mg total) by mouth daily. 30 capsule 11   torsemide (DEMADEX) 20 MG tablet TAKE 3 TABLETS BY MOUTH EVERY DAY 270 tablet 3   No current facility-administered medications for this visit.   Facility-Administered Medications Ordered in Other Visits  Medication Dose Route Frequency Provider Last Rate Last Admin   sodium chloride flush (NS) 0.9 % injection 3 mL  3 mL Intravenous Q12H Scoggins, Amber, NP        Allergies:   Hydromorphone    Social History:   reports that he quit smoking about 19 years ago. His smoking use included cigarettes. He started smoking about 65 years ago. He has a 46.1 pack-year smoking history. He has been exposed to tobacco smoke. He quit smokeless tobacco use about 19 years ago.  His smokeless tobacco use included snuff. He reports that he does not currently use alcohol after a past usage of about 7.0 standard drinks of alcohol per week. He reports that he does not use drugs.   Family History:  family history includes CAD in  his father; Heart attack in his father; Heart disease in his mother; Hypertension in his mother.    ROS:     Review of Systems  Constitutional: Negative.   HENT: Negative.    Eyes: Negative.   Respiratory: Negative.    Cardiovascular:  Positive for chest pain.  Gastrointestinal: Negative.   Genitourinary: Negative.   Musculoskeletal: Negative.   Skin: Negative.   Neurological: Negative.   Endo/Heme/Allergies: Negative.   Psychiatric/Behavioral: Negative.    All other systems reviewed and are negative.     All other systems are reviewed and negative.    PHYSICAL EXAM: VS:  BP 125/70   Pulse 60   Ht 6\' 2"  (1.88 m)   Wt 163 lb 6.4 oz (74.1 kg)   SpO2 90%   BMI 20.98 kg/m  , BMI Body mass index is 20.98 kg/m. Last weight:  Wt Readings from Last 3 Encounters:  10/25/22 163 lb 6.4 oz (74.1 kg)  08/18/22 160 lb (72.6 kg)  08/16/22 160 lb (72.6 kg)     Physical Exam Vitals reviewed.  Constitutional:      Appearance: Normal appearance. He is normal weight.  HENT:     Head: Normocephalic.     Nose: Nose normal.     Mouth/Throat:     Mouth: Mucous membranes are moist.  Eyes:     Pupils: Pupils are equal, round, and reactive to light.  Cardiovascular:     Rate and Rhythm: Normal rate and regular rhythm.     Pulses: Normal pulses.     Heart sounds: Normal heart sounds.  Pulmonary:     Effort: Pulmonary effort is normal.  Abdominal:     General: Abdomen is flat. Bowel sounds are normal.  Musculoskeletal:        General: Normal range of motion.     Cervical back: Normal range of motion.  Skin:    General: Skin is warm.  Neurological:     General: No focal deficit present.     Mental Status: He is alert.  Psychiatric:        Mood and  Affect: Mood normal.       EKG:   Recent Labs: 03/06/2022: Magnesium 2.1 05/06/2022: ALT 12 07/03/2022: BUN 23; Creatinine, Ser 1.19; Hemoglobin 13.8; Platelets 204; Potassium 4.1; Sodium 143    Lipid Panel    Component  Value Date/Time   CHOL 141 11/12/2020 0435   CHOL 175 06/01/2015 1049   TRIG 47 11/12/2020 0435   HDL 38 (L) 11/12/2020 0435   HDL 70 06/01/2015 1049   CHOLHDL 3.7 11/12/2020 0435   VLDL 9 11/12/2020 0435   LDLCALC 94 11/12/2020 0435   LDLCALC 105 (H) 12/07/2016 0958      Other studies Reviewed: Additional studies/ records that were reviewed today include:  Review of the above records demonstrates:       No data to display            ASSESSMENT AND PLAN:    ICD-10-CM   1. Chronic heart failure with preserved ejection fraction (HCC)  I50.32 MYOCARDIAL PERFUSION IMAGING    PCV ECHOCARDIOGRAM COMPLETE    2. Acute on chronic diastolic CHF (congestive heart failure) (HCC)  I50.33 MYOCARDIAL PERFUSION IMAGING    PCV ECHOCARDIOGRAM COMPLETE    3. Primary hypertension  I10 MYOCARDIAL PERFUSION IMAGING    PCV ECHOCARDIOGRAM COMPLETE    4. Other chest pain  R07.89 MYOCARDIAL PERFUSION IMAGING    PCV ECHOCARDIOGRAM COMPLETE   SET UP STRESS TEST ECHO    5. Atrial fibrillation, currently in sinus rhythm  Z86.79        Problem List Items Addressed This Visit       Cardiovascular and Mediastinum   HTN (hypertension)   Relevant Orders   MYOCARDIAL PERFUSION IMAGING   PCV ECHOCARDIOGRAM COMPLETE   Acute on chronic diastolic CHF (congestive heart failure) (HCC)   Relevant Orders   MYOCARDIAL PERFUSION IMAGING   PCV ECHOCARDIOGRAM COMPLETE   (HFpEF) heart failure with preserved ejection fraction (HCC) - Primary   Relevant Orders   MYOCARDIAL PERFUSION IMAGING   PCV ECHOCARDIOGRAM COMPLETE     Other   Chest pain   Relevant Orders   MYOCARDIAL PERFUSION IMAGING   PCV ECHOCARDIOGRAM COMPLETE   Other Visit Diagnoses     Atrial fibrillation, currently in sinus rhythm              Disposition:   Return in about 4 weeks (around 11/22/2022) for ECHO, STRESS TEST AND F/U.    Total time spent: 30 minutes  Signed,  Adrian Blackwater, MD  10/25/2022 10:19 AM     [facility]

## 2022-10-27 ENCOUNTER — Ambulatory Visit: Payer: Medicare HMO | Admitting: Cardiovascular Disease

## 2022-10-31 ENCOUNTER — Encounter: Payer: Self-pay | Admitting: Cardiovascular Disease

## 2022-10-31 ENCOUNTER — Ambulatory Visit: Payer: Medicare HMO

## 2022-10-31 ENCOUNTER — Ambulatory Visit (INDEPENDENT_AMBULATORY_CARE_PROVIDER_SITE_OTHER): Payer: Medicare HMO | Admitting: Cardiovascular Disease

## 2022-10-31 VITALS — BP 110/70 | HR 70 | Ht 74.0 in | Wt 163.0 lb

## 2022-10-31 DIAGNOSIS — R079 Chest pain, unspecified: Secondary | ICD-10-CM

## 2022-10-31 DIAGNOSIS — I480 Paroxysmal atrial fibrillation: Secondary | ICD-10-CM

## 2022-10-31 DIAGNOSIS — I351 Nonrheumatic aortic (valve) insufficiency: Secondary | ICD-10-CM | POA: Diagnosis not present

## 2022-10-31 DIAGNOSIS — Z955 Presence of coronary angioplasty implant and graft: Secondary | ICD-10-CM | POA: Diagnosis not present

## 2022-10-31 DIAGNOSIS — I10 Essential (primary) hypertension: Secondary | ICD-10-CM

## 2022-10-31 DIAGNOSIS — I5032 Chronic diastolic (congestive) heart failure: Secondary | ICD-10-CM | POA: Diagnosis not present

## 2022-10-31 DIAGNOSIS — I361 Nonrheumatic tricuspid (valve) insufficiency: Secondary | ICD-10-CM

## 2022-10-31 MED ORDER — ISOSORBIDE MONONITRATE ER 30 MG PO TB24: 30.0000 mg | ORAL_TABLET | Freq: Two times a day (BID) | ORAL | 2 refills | Status: DC

## 2022-10-31 MED ORDER — METOPROLOL SUCCINATE ER 25 MG PO TB24: 25.0000 mg | ORAL_TABLET | Freq: Every day | ORAL | 11 refills | Status: DC

## 2022-10-31 NOTE — Progress Notes (Signed)
Cardiology Office Note   Date:  10/31/2022   ID:  [name] Browning, DOB 02-27-1940, MRN [id number]  PCP:  [facility]  Cardiologist:  Adrian Blackwater, MD      History of Present Illness: [name] Browning is a 82 y.o. male who presents for No chief complaint on file.   Had chest pain 4 am last night, no chest  now      Past Medical History:  Diagnosis Date   Anginal pain (HCC)    Anxiety    Aortic atherosclerosis (HCC)    Atrial fibrillation (HCC) 01/2019   Bilateral carpal tunnel syndrome    CAD (coronary artery disease)    a.) LHC 2004 --> normal coronaries. b.) normal stress test in 2007 and 2011; c.) Lexiscan 05/20/2014 --> LVEF 55-65%; no significant stress induced ischemia/arrythmia. d.) CT chest 03/25/2019 --> coronaries carcified.   Carotid atherosclerosis, bilateral    Carpal tunnel syndrome, bilateral    CHF (congestive heart failure) (HCC)    Chronic anticoagulation    a.) ASA + apixaban   COPD (chronic obstructive pulmonary disease) (HCC)    CVA (cerebral vascular accident) (HCC)    Degenerative disc disease, cervical    Diastolic dysfunction    a.) TTE 05/29/2014 --> LVEF 60-65%; G1DD.   DVT (deep venous thrombosis) (HCC)    GERD (gastroesophageal reflux disease)    History of 2019 novel coronavirus disease (COVID-19) 02/08/2019   HLD (hyperlipidemia)    Hypertension    Kidney stones    Osteoarthritis of right shoulder    Pneumonia    Respiratory failure, acute (HCC) 09/20/2020   a.) severe respiratory distress 1 hour after urological surgery. CXR (+) for acute pulmonary edema. Transferred to ICU and placed on NIPPV. Questionable aspiration PNA. (+) A.fib with RVR. Improved with ABX, diuresis, and amiodarone.   Skin cancer of face    a.) RIGHT ear and RIGHT forehead; excised.   Syncope    TIA (transient ischemic attack) 2016   Valvular regurgitation    a.) TTE 05/26/2014 --> LVEF 60-65%; trivial MR, mild TR; no AR or PR. b.) TTE 04/20/2015 --> LVEF  55-60%; trivial MR and PR; no AR or TR.     Past Surgical History:  Procedure Laterality Date   CARDIAC CATHETERIZATION  2004   CARPAL TUNNEL RELEASE Right 06/11/2013   CENTRAL LINE INSERTION  11/14/2020   Procedure: CENTRAL LINE INSERTION;  Surgeon: Marykay Lex, MD;  Location: [facility] INVASIVE CV LAB;  Service: Cardiovascular;;   CORONARY/GRAFT ACUTE MI REVASCULARIZATION N/A 11/14/2020   Procedure: Coronary/Graft Acute MI Revascularization;  Surgeon: Marykay Lex, MD;  Location: [facility] INVASIVE CV LAB;  Service: Cardiovascular;  Laterality: N/A;   CYSTOSCOPY W/ RETROGRADES  10/16/2020   Procedure: CYSTOSCOPY WITH RETROGRADE PYELOGRAM;  Surgeon: Sondra Come, MD;  Location: [facility] ORS;  Service: Urology;;   CYSTOSCOPY W/ URETERAL STENT PLACEMENT Left 09/20/2020   Procedure: CYSTOSCOPY WITH RETROGRADE PYELOGRAM/URETERAL STENT PLACEMENT;  Surgeon: Jannifer Hick, MD;  Location: [facility] ORS;  Service: Urology;  Laterality: Left;   CYSTOSCOPY/URETEROSCOPY/HOLMIUM LASER/STENT PLACEMENT Left 10/16/2020   Procedure: CYSTOSCOPY/URETEROSCOPY/HOLMIUM LASER/STENT PLACEMENT;  Surgeon: Sondra Come, MD;  Location: [facility] ORS;  Service: Urology;  Laterality: Left;   ESOPHAGOGASTRODUODENOSCOPY N/A 11/06/2020   Procedure: ESOPHAGOGASTRODUODENOSCOPY (EGD);  Surgeon: Midge Minium, MD;  Location: [facility] ENDOSCOPY;  Service: Endoscopy;  Laterality: N/A;   LEFT HEART CATH AND CORONARY ANGIOGRAPHY N/A 11/14/2020   Procedure: LEFT HEART CATH AND CORONARY ANGIOGRAPHY;  Surgeon:  Marykay Lex, MD;  Location: [facility] INVASIVE CV LAB;  Service: Cardiovascular;  Laterality: N/A;   LEFT HEART CATH AND CORONARY ANGIOGRAPHY Left 01/28/2022   Procedure: LEFT HEART CATH AND CORONARY ANGIOGRAPHY;  Surgeon: Laurier Nancy, MD;  Location: [facility] INVASIVE CV LAB;  Service: Cardiovascular;  Laterality: Left;   SHOULDER ARTHROSCOPY WITH OPEN ROTATOR CUFF REPAIR Right 08/24/2017   Procedure: SHOULDER ARTHROSCOPY WITH OPEN ROTATOR CUFF  REPAIR;  Surgeon: Christena Flake, MD;  Location: [facility] ORS;  Service: Orthopedics;  Laterality: Right;   SKIN CANCER EXCISION  12/01/2016   right ear    SKIN CANCER EXCISION     remove from the right side of the face    THROMBECTOMY Right 2004   leg   THYROIDECTOMY  1950   Not sure if total or partial thyroidectomy.     Current Outpatient Medications  Medication Sig Dispense Refill   isosorbide mononitrate (IMDUR) 30 MG 24 hr tablet Take 1 tablet (30 mg total) by mouth 2 (two) times daily. 60 tablet 2   metoprolol succinate (TOPROL XL) 25 MG 24 hr tablet Take 1 tablet (25 mg total) by mouth daily. 30 tablet 11   acetaminophen (TYLENOL) 500 MG tablet Take 2 tablets (1,000 mg total) by mouth every 6 (six) hours as needed for mild pain.     aspirin EC 81 MG tablet Take 81 mg by mouth daily after supper.     atorvastatin (LIPITOR) 40 MG tablet Take 40 mg by mouth daily after supper.     Budeson-Glycopyrrol-Formoterol (BREZTRI AEROSPHERE) 160-9-4.8 MCG/ACT AERO Inhale 2 puffs into the lungs in the morning and at bedtime. 10.7 g 12   cetirizine (ZYRTEC) 10 MG tablet Take 10 mg by mouth daily.     clopidogrel (PLAVIX) 75 MG tablet TAKE 1 TABLET BY MOUTH EVERY DAY 90 tablet 3   famotidine (PEPCID) 20 MG tablet TAKE 1 TABLET BY MOUTH AFTER SUPPER 90 tablet 3   ferrous gluconate (FERGON) 240 (27 FE) MG tablet Take 240 mg by mouth 3 (three) times daily with meals.     finasteride (PROSCAR) 5 MG tablet Take 1 tablet (5 mg total) by mouth daily. 90 tablet 3   hyoscyamine (ANASPAZ) 0.125 MG TBDP disintergrating tablet Place 1 tablet (0.125 mg total) under the tongue every 6 (six) hours as needed. 120 tablet 0   KLOR-CON M20 20 MEQ tablet TAKE 1 TABLET BY MOUTH EVERY DAY 90 tablet 3   losartan (COZAAR) 50 MG tablet TAKE 1 TABLET BY MOUTH EVERY DAY 90 tablet 1   Meth-Hyo-M Bl-Na Phos-Ph Sal (URIBEL) 118 MG CAPS Take 1 capsule (118 mg total) by mouth every 6 (six) hours as needed. 30 capsule 0    methenamine (HIPREX) 1 g tablet Take 1 tablet (1 g total) by mouth 2 (two) times daily with a meal. 60 tablet 3   montelukast (SINGULAIR) 10 MG tablet Take 10 mg by mouth at bedtime.     nitrofurantoin, macrocrystal-monohydrate, (MACROBID) 100 MG capsule Take 1 capsule (100 mg total) by mouth every 12 (twelve) hours. 14 capsule 0   ondansetron (ZOFRAN-ODT) 4 MG disintegrating tablet Take 1 tablet (4 mg total) by mouth every 6 (six) hours as needed for nausea or vomiting. 20 tablet 0   pantoprazole (PROTONIX) 40 MG tablet TAKE 1 TABLET BY MOUTH TWICE A DAY 180 tablet 1   ranolazine (RANEXA) 1000 MG SR tablet TAKE 1 TABLET BY MOUTH TWICE A DAY 180 tablet 1   roflumilast (DALIRESP)  500 MCG TABS tablet Take 500 mcg by mouth daily.     tamsulosin (FLOMAX) 0.4 MG CAPS capsule Take 1 capsule (0.4 mg total) by mouth daily. 30 capsule 11   torsemide (DEMADEX) 20 MG tablet TAKE 3 TABLETS BY MOUTH EVERY DAY 270 tablet 3   No current facility-administered medications for this visit.   Facility-Administered Medications Ordered in Other Visits  Medication Dose Route Frequency Provider Last Rate Last Admin   sodium chloride flush (NS) 0.9 % injection 3 mL  3 mL Intravenous Q12H Scoggins, Amber, NP        Allergies:   Hydromorphone    Social History:   reports that he quit smoking about 19 years ago. His smoking use included cigarettes. He started smoking about 65 years ago. He has a 46.1 pack-year smoking history. He has been exposed to tobacco smoke. He quit smokeless tobacco use about 19 years ago.  His smokeless tobacco use included snuff. He reports that he does not currently use alcohol after a past usage of about 7.0 standard drinks of alcohol per week. He reports that he does not use drugs.   Family History:  family history includes CAD in his father; Heart attack in his father; Heart disease in his mother; Hypertension in his mother.    ROS:     Review of Systems  Constitutional: Negative.    HENT: Negative.    Eyes: Negative.   Respiratory: Negative.    Gastrointestinal: Negative.   Genitourinary: Negative.   Musculoskeletal: Negative.   Skin: Negative.   Neurological: Negative.   Endo/Heme/Allergies: Negative.   Psychiatric/Behavioral: Negative.    All other systems reviewed and are negative.     All other systems are reviewed and negative.    PHYSICAL EXAM: VS:  BP 110/70   Pulse 70   Ht 6\' 2"  (1.88 m)   Wt 163 lb (73.9 kg)   SpO2 98%   BMI 20.93 kg/m  , BMI Body mass index is 20.93 kg/m. Last weight:  Wt Readings from Last 3 Encounters:  10/31/22 163 lb (73.9 kg)  10/25/22 163 lb 6.4 oz (74.1 kg)  08/18/22 160 lb (72.6 kg)     Physical Exam Vitals reviewed.  Constitutional:      Appearance: Normal appearance. He is normal weight.  HENT:     Head: Normocephalic.     Nose: Nose normal.     Mouth/Throat:     Mouth: Mucous membranes are moist.  Eyes:     Pupils: Pupils are equal, round, and reactive to light.  Cardiovascular:     Rate and Rhythm: Normal rate and regular rhythm.     Pulses: Normal pulses.     Heart sounds: Normal heart sounds.  Pulmonary:     Effort: Pulmonary effort is normal.  Abdominal:     General: Abdomen is flat. Bowel sounds are normal.  Musculoskeletal:        General: Normal range of motion.     Cervical back: Normal range of motion.  Skin:    General: Skin is warm.  Neurological:     General: No focal deficit present.     Mental Status: He is alert.  Psychiatric:        Mood and Affect: Mood normal.       EKG: AFIB 50/MIN NON SPECIFIC ST CHANGES  Recent Labs: 03/06/2022: Magnesium 2.1 05/06/2022: ALT 12 07/03/2022: BUN 23; Creatinine, Ser 1.19; Hemoglobin 13.8; Platelets 204; Potassium 4.1; Sodium 143    Lipid  Panel    Component Value Date/Time   CHOL 141 11/12/2020 0435   CHOL 175 06/01/2015 1049   TRIG 47 11/12/2020 0435   HDL 38 (L) 11/12/2020 0435   HDL 70 06/01/2015 1049   CHOLHDL 3.7  11/12/2020 0435   VLDL 9 11/12/2020 0435   LDLCALC 94 11/12/2020 0435   LDLCALC 105 (H) 12/07/2016 0958      Other studies Reviewed: Additional studies/ records that were reviewed today include:  Review of the above records demonstrates:       No data to display            ASSESSMENT AND PLAN:    ICD-10-CM   1. S/P coronary artery stent placement  Z95.5 PCV ECHOCARDIOGRAM COMPLETE    MYOCARDIAL PERFUSION IMAGING    isosorbide mononitrate (IMDUR) 30 MG 24 hr tablet    metoprolol succinate (TOPROL XL) 25 MG 24 hr tablet    2. Chest pain, unspecified type  R07.9 PCV ECHOCARDIOGRAM COMPLETE    MYOCARDIAL PERFUSION IMAGING    isosorbide mononitrate (IMDUR) 30 MG 24 hr tablet    metoprolol succinate (TOPROL XL) 25 MG 24 hr tablet   echo, stress test was set up but next month, need to be done sooner. EKG SHOWS AFIB SLOW RATE 50/MIN NON SPECIFIC ST CHANGES    3. Chronic heart failure with preserved ejection fraction (HCC)  I50.32 PCV ECHOCARDIOGRAM COMPLETE    MYOCARDIAL PERFUSION IMAGING    isosorbide mononitrate (IMDUR) 30 MG 24 hr tablet    metoprolol succinate (TOPROL XL) 25 MG 24 hr tablet    4. Primary hypertension  I10 PCV ECHOCARDIOGRAM COMPLETE    MYOCARDIAL PERFUSION IMAGING    isosorbide mononitrate (IMDUR) 30 MG 24 hr tablet    metoprolol succinate (TOPROL XL) 25 MG 24 hr tablet    5. AF (paroxysmal atrial fibrillation) (HCC)  I48.0 PCV ECHOCARDIOGRAM COMPLETE    MYOCARDIAL PERFUSION IMAGING    isosorbide mononitrate (IMDUR) 30 MG 24 hr tablet    metoprolol succinate (TOPROL XL) 25 MG 24 hr tablet   hEART RATE 50, DECREASE METOPROLOL TO 25 DAILY       Problem List Items Addressed This Visit       Cardiovascular and Mediastinum   HTN (hypertension)   Relevant Medications   isosorbide mononitrate (IMDUR) 30 MG 24 hr tablet   metoprolol succinate (TOPROL XL) 25 MG 24 hr tablet   Other Relevant Orders   PCV ECHOCARDIOGRAM COMPLETE   MYOCARDIAL PERFUSION  IMAGING   AF (paroxysmal atrial fibrillation) (HCC)   Relevant Medications   isosorbide mononitrate (IMDUR) 30 MG 24 hr tablet   metoprolol succinate (TOPROL XL) 25 MG 24 hr tablet   Other Relevant Orders   PCV ECHOCARDIOGRAM COMPLETE   MYOCARDIAL PERFUSION IMAGING   (HFpEF) heart failure with preserved ejection fraction (HCC)   Relevant Medications   isosorbide mononitrate (IMDUR) 30 MG 24 hr tablet   metoprolol succinate (TOPROL XL) 25 MG 24 hr tablet   Other Relevant Orders   PCV ECHOCARDIOGRAM COMPLETE   MYOCARDIAL PERFUSION IMAGING     Other   Chest pain   Relevant Medications   isosorbide mononitrate (IMDUR) 30 MG 24 hr tablet   metoprolol succinate (TOPROL XL) 25 MG 24 hr tablet   Other Relevant Orders   PCV ECHOCARDIOGRAM COMPLETE   MYOCARDIAL PERFUSION IMAGING   S/P coronary artery stent placement - Primary   Relevant Medications   isosorbide mononitrate (IMDUR) 30 MG 24 hr tablet  metoprolol succinate (TOPROL XL) 25 MG 24 hr tablet   Other Relevant Orders   PCV ECHOCARDIOGRAM COMPLETE   MYOCARDIAL PERFUSION IMAGING       Disposition:   Return in about 1 week (around 11/07/2022) for HAVING RECURRENT CHEST PAIN, NEED STRESS TEST, ECHO THIS WEEK.    Total time spent: 40 minutes  Signed,  Adrian Blackwater, MD  10/31/2022 10:43 AM    [facility]

## 2022-10-31 NOTE — Patient Instructions (Signed)
START TAKING ISOSORBIDE 30 MG TWICE A DAY, AND DECREASE METOPROLOL TO 25 MG ONCE A DAY. PICK UP NEW MEDICATION PHARMACY

## 2022-11-01 ENCOUNTER — Ambulatory Visit (INDEPENDENT_AMBULATORY_CARE_PROVIDER_SITE_OTHER): Payer: Medicare HMO

## 2022-11-01 DIAGNOSIS — Z955 Presence of coronary angioplasty implant and graft: Secondary | ICD-10-CM

## 2022-11-01 DIAGNOSIS — I480 Paroxysmal atrial fibrillation: Secondary | ICD-10-CM

## 2022-11-01 DIAGNOSIS — R079 Chest pain, unspecified: Secondary | ICD-10-CM | POA: Diagnosis not present

## 2022-11-01 DIAGNOSIS — I10 Essential (primary) hypertension: Secondary | ICD-10-CM

## 2022-11-01 DIAGNOSIS — I5032 Chronic diastolic (congestive) heart failure: Secondary | ICD-10-CM | POA: Diagnosis not present

## 2022-11-01 MED ORDER — TECHNETIUM TC 99M SESTAMIBI GENERIC - CARDIOLITE
32.6000 | Freq: Once | INTRAVENOUS | Status: AC | PRN
  Administered 2022-11-01: 32.6 via INTRAVENOUS

## 2022-11-01 MED ORDER — TECHNETIUM TC 99M SESTAMIBI GENERIC - CARDIOLITE
10.4000 | Freq: Once | INTRAVENOUS | Status: AC | PRN
  Administered 2022-11-01: 10.4 via INTRAVENOUS

## 2022-11-03 ENCOUNTER — Telehealth: Payer: Self-pay

## 2022-11-03 ENCOUNTER — Ambulatory Visit: Payer: Medicare HMO | Admitting: Urology

## 2022-11-03 NOTE — Telephone Encounter (Signed)
Called pt, no answer. LM informing pt that we are canceling his appointment today per Dr. Richardo Hanks as he has moved his care to [facility].

## 2022-11-17 ENCOUNTER — Other Ambulatory Visit: Payer: Medicare HMO

## 2022-11-21 ENCOUNTER — Ambulatory Visit (INDEPENDENT_AMBULATORY_CARE_PROVIDER_SITE_OTHER): Payer: Medicare HMO | Admitting: Internal Medicine

## 2022-11-21 ENCOUNTER — Encounter: Payer: Self-pay | Admitting: Internal Medicine

## 2022-11-21 VITALS — BP 122/54 | HR 72 | Ht 72.0 in | Wt 163.0 lb

## 2022-11-21 DIAGNOSIS — E782 Mixed hyperlipidemia: Secondary | ICD-10-CM | POA: Diagnosis not present

## 2022-11-21 DIAGNOSIS — Z8679 Personal history of other diseases of the circulatory system: Secondary | ICD-10-CM

## 2022-11-21 DIAGNOSIS — I10 Essential (primary) hypertension: Secondary | ICD-10-CM | POA: Diagnosis not present

## 2022-11-21 DIAGNOSIS — Z23 Encounter for immunization: Secondary | ICD-10-CM | POA: Diagnosis not present

## 2022-11-21 DIAGNOSIS — R8281 Pyuria: Secondary | ICD-10-CM

## 2022-11-21 DIAGNOSIS — Z955 Presence of coronary angioplasty implant and graft: Secondary | ICD-10-CM

## 2022-11-21 DIAGNOSIS — I25110 Atherosclerotic heart disease of native coronary artery with unstable angina pectoris: Secondary | ICD-10-CM | POA: Diagnosis not present

## 2022-11-21 DIAGNOSIS — E559 Vitamin D deficiency, unspecified: Secondary | ICD-10-CM | POA: Diagnosis not present

## 2022-11-21 LAB — POCT URINALYSIS DIPSTICK
Bilirubin, UA: NEGATIVE
Blood, UA: NEGATIVE
Glucose, UA: NEGATIVE
Ketones, UA: NEGATIVE
Nitrite, UA: NEGATIVE
Protein, UA: NEGATIVE
Spec Grav, UA: 1.01 (ref 1.010–1.025)
Urobilinogen, UA: 0.2 U/dL
pH, UA: 5 (ref 5.0–8.0)

## 2022-11-21 LAB — POC CREATINE & ALBUMIN,URINE
Albumin/Creatinine Ratio, Urine, POC: >300
Creatinine, POC: 50 mg/dL
Microalbumin Ur, POC: 10 mg/L

## 2022-11-21 NOTE — Progress Notes (Signed)
New Patient Office Visit  Subjective    Patient ID: [name] Browning, male    DOB: 02/22/1940  Age: 82 y.o. MRN: [id number]  CC:  Chief Complaint  Patient presents with   Establish Care    HPI [name] Browning presents to establish care Previous Primary Care provider/office:   he does not have additional concerns to discuss today.   Patient comes in to establish PMD.  He is already established to the cardiology practice at this office.  He reports that his primary care retired several years ago.  Patient has several medical problems but they are currently stable on medication he intermittently gets chest pain and shortness of breath but his meds are being adjusted by the cardiologist and the pulmonologist.  Patient is also under the care of urologist. He does not like to wear his dentures which needs some fitting. Denies nausea or vomiting, no diarrhea and no constipation.    Outpatient Encounter Medications as of 11/21/2022  Medication Sig   acetaminophen (TYLENOL) 500 MG tablet Take 2 tablets (1,000 mg total) by mouth every 6 (six) hours as needed for mild pain.   aspirin EC 81 MG tablet Take 81 mg by mouth daily after supper.   atorvastatin (LIPITOR) 40 MG tablet Take 40 mg by mouth daily after supper.   Budeson-Glycopyrrol-Formoterol (BREZTRI AEROSPHERE) 160-9-4.8 MCG/ACT AERO Inhale 2 puffs into the lungs in the morning and at bedtime.   cetirizine (ZYRTEC) 10 MG tablet Take 10 mg by mouth daily.   clopidogrel (PLAVIX) 75 MG tablet TAKE 1 TABLET BY MOUTH EVERY DAY   famotidine (PEPCID) 20 MG tablet TAKE 1 TABLET BY MOUTH AFTER SUPPER   ferrous gluconate (FERGON) 240 (27 FE) MG tablet Take 240 mg by mouth 3 (three) times daily with meals.   finasteride (PROSCAR) 5 MG tablet Take 1 tablet (5 mg total) by mouth daily.   hyoscyamine (ANASPAZ) 0.125 MG TBDP disintergrating tablet Place 1 tablet (0.125 mg total) under the tongue every 6 (six) hours as needed.   isosorbide mononitrate  (IMDUR) 30 MG 24 hr tablet Take 1 tablet (30 mg total) by mouth 2 (two) times daily.   KLOR-CON M20 20 MEQ tablet TAKE 1 TABLET BY MOUTH EVERY DAY   losartan (COZAAR) 50 MG tablet TAKE 1 TABLET BY MOUTH EVERY DAY   Meth-Hyo-M Bl-Na Phos-Ph Sal (URIBEL) 118 MG CAPS Take 1 capsule (118 mg total) by mouth every 6 (six) hours as needed.   methenamine (HIPREX) 1 g tablet Take 1 tablet (1 g total) by mouth 2 (two) times daily with a meal.   metoprolol succinate (TOPROL XL) 25 MG 24 hr tablet Take 1 tablet (25 mg total) by mouth daily.   montelukast (SINGULAIR) 10 MG tablet Take 10 mg by mouth at bedtime.   nitrofurantoin, macrocrystal-monohydrate, (MACROBID) 100 MG capsule Take 1 capsule (100 mg total) by mouth every 12 (twelve) hours.   ondansetron (ZOFRAN-ODT) 4 MG disintegrating tablet Take 1 tablet (4 mg total) by mouth every 6 (six) hours as needed for nausea or vomiting.   pantoprazole (PROTONIX) 40 MG tablet TAKE 1 TABLET BY MOUTH TWICE A DAY   ranolazine (RANEXA) 1000 MG SR tablet TAKE 1 TABLET BY MOUTH TWICE A DAY   roflumilast (DALIRESP) 500 MCG TABS tablet Take 500 mcg by mouth daily.   tamsulosin (FLOMAX) 0.4 MG CAPS capsule Take 1 capsule (0.4 mg total) by mouth daily.   torsemide (DEMADEX) 20 MG tablet TAKE 3 TABLETS BY MOUTH  EVERY DAY   Facility-Administered Encounter Medications as of 11/21/2022  Medication   sodium chloride flush (NS) 0.9 % injection 3 mL    Past Medical History:  Diagnosis Date   Anginal pain (HCC)    Anxiety    Aortic atherosclerosis (HCC)    Atrial fibrillation (HCC) 01/2019   Bilateral carpal tunnel syndrome    CAD (coronary artery disease)    a.) LHC 2004 --> normal coronaries. b.) normal stress test in 2007 and 2011; c.) Lexiscan 05/20/2014 --> LVEF 55-65%; no significant stress induced ischemia/arrythmia. d.) CT chest 03/25/2019 --> coronaries carcified.   Carotid atherosclerosis, bilateral    Carpal tunnel syndrome, bilateral    CHF (congestive heart  failure) (HCC)    Chronic anticoagulation    a.) ASA + apixaban   COPD (chronic obstructive pulmonary disease) (HCC)    CVA (cerebral vascular accident) (HCC)    Degenerative disc disease, cervical    Diastolic dysfunction    a.) TTE 05/29/2014 --> LVEF 60-65%; G1DD.   DVT (deep venous thrombosis) (HCC)    GERD (gastroesophageal reflux disease)    History of 2019 novel coronavirus disease (COVID-19) 02/08/2019   HLD (hyperlipidemia)    Hypertension    Kidney stones    Osteoarthritis of right shoulder    Pneumonia    Respiratory failure, acute (HCC) 09/20/2020   a.) severe respiratory distress 1 hour after urological surgery. CXR (+) for acute pulmonary edema. Transferred to ICU and placed on NIPPV. Questionable aspiration PNA. (+) A.fib with RVR. Improved with ABX, diuresis, and amiodarone.   Skin cancer of face    a.) RIGHT ear and RIGHT forehead; excised.   Syncope    TIA (transient ischemic attack) 2016   Valvular regurgitation    a.) TTE 05/26/2014 --> LVEF 60-65%; trivial MR, mild TR; no AR or PR. b.) TTE 04/20/2015 --> LVEF 55-60%; trivial MR and PR; no AR or TR.    Past Surgical History:  Procedure Laterality Date   CARDIAC CATHETERIZATION  2004   CARPAL TUNNEL RELEASE Right 06/11/2013   CENTRAL LINE INSERTION  11/14/2020   Procedure: CENTRAL LINE INSERTION;  Surgeon: Marykay Lex, MD;  Location: [facility] INVASIVE CV LAB;  Service: Cardiovascular;;   CORONARY/GRAFT ACUTE MI REVASCULARIZATION N/A 11/14/2020   Procedure: Coronary/Graft Acute MI Revascularization;  Surgeon: Marykay Lex, MD;  Location: [facility] INVASIVE CV LAB;  Service: Cardiovascular;  Laterality: N/A;   CYSTOSCOPY W/ RETROGRADES  10/16/2020   Procedure: CYSTOSCOPY WITH RETROGRADE PYELOGRAM;  Surgeon: Sondra Come, MD;  Location: [facility] ORS;  Service: Urology;;   CYSTOSCOPY W/ URETERAL STENT PLACEMENT Left 09/20/2020   Procedure: CYSTOSCOPY WITH RETROGRADE PYELOGRAM/URETERAL STENT PLACEMENT;  Surgeon: Jannifer Hick, MD;  Location: [facility] ORS;  Service: Urology;  Laterality: Left;   CYSTOSCOPY/URETEROSCOPY/HOLMIUM LASER/STENT PLACEMENT Left 10/16/2020   Procedure: CYSTOSCOPY/URETEROSCOPY/HOLMIUM LASER/STENT PLACEMENT;  Surgeon: Sondra Come, MD;  Location: [facility] ORS;  Service: Urology;  Laterality: Left;   ESOPHAGOGASTRODUODENOSCOPY N/A 11/06/2020   Procedure: ESOPHAGOGASTRODUODENOSCOPY (EGD);  Surgeon: Midge Minium, MD;  Location: [facility] ENDOSCOPY;  Service: Endoscopy;  Laterality: N/A;   LEFT HEART CATH AND CORONARY ANGIOGRAPHY N/A 11/14/2020   Procedure: LEFT HEART CATH AND CORONARY ANGIOGRAPHY;  Surgeon: Marykay Lex, MD;  Location: [facility] INVASIVE CV LAB;  Service: Cardiovascular;  Laterality: N/A;   LEFT HEART CATH AND CORONARY ANGIOGRAPHY Left 01/28/2022   Procedure: LEFT HEART CATH AND CORONARY ANGIOGRAPHY;  Surgeon: Laurier Nancy, MD;  Location: [facility] INVASIVE CV LAB;  Service: Cardiovascular;  Laterality: Left;   SHOULDER ARTHROSCOPY WITH OPEN ROTATOR CUFF REPAIR Right 08/24/2017   Procedure: SHOULDER ARTHROSCOPY WITH OPEN ROTATOR CUFF REPAIR;  Surgeon: Christena Flake, MD;  Location: [facility] ORS;  Service: Orthopedics;  Laterality: Right;   SKIN CANCER EXCISION  12/01/2016   right ear    SKIN CANCER EXCISION     remove from the right side of the face    THROMBECTOMY Right 2004   leg   THYROIDECTOMY  1950   Not sure if total or partial thyroidectomy.    Family History  Problem Relation Age of Onset   Hypertension Mother    Heart disease Mother    CAD Father    Heart attack Father     Social History   Socioeconomic History   Marital status: Legally Separated    Spouse name: Not on file   Number of children: 1   Years of education: Not on file   Highest education level: Not on file  Occupational History   Not on file  Tobacco Use   Smoking status: Former    Current packs/day: 0.00    Average packs/day: 1 pack/day for 46.1 years (46.1 ttl pk-yrs)    Types: Cigarettes     Start date: 01/10/1957    Quit date: 02/11/2003    Years since quitting: 19.7    Passive exposure: Past   Smokeless tobacco: Former    Types: Snuff    Quit date: 02/11/2003  Vaping Use   Vaping status: Never Used  Substance and Sexual Activity   Alcohol use: Not Currently    Alcohol/week: 7.0 standard drinks of alcohol    Types: 7 Cans of beer per week   Drug use: No   Sexual activity: Yes    Partners: Female  Other Topics Concern   Not on file  Social History Narrative   Live with grandaughter, April. 1 son that lives in [place].   Social Determinants of Health   Financial Resource Strain: Not on file  Food Insecurity: No Food Insecurity (05/20/2022)   Hunger Vital Sign    Worried About Running Out of Food in the Last Year: Never true    Ran Out of Food in the Last Year: Never true  Transportation Needs: No Transportation Needs (05/20/2022)   PRAPARE - Administrator, Civil Service (Medical): No    Lack of Transportation (Non-Medical): No  Physical Activity: Not on file  Stress: Not on file  Social Connections: Not on file  Intimate Partner Violence: Not At Risk (03/05/2022)   Humiliation, Afraid, Rape, and Kick questionnaire    Fear of Current or Ex-Partner: No    Emotionally Abused: No    Physically Abused: No    Sexually Abused: No    Review of Systems  Constitutional:  Positive for malaise/fatigue. Negative for chills, fever and weight loss.  HENT:  Positive for hearing loss. Negative for congestion and tinnitus.   Eyes: Negative.  Negative for blurred vision and double vision.  Respiratory:  Positive for wheezing. Negative for cough, shortness of breath and stridor.   Cardiovascular: Negative.  Negative for chest pain, palpitations and leg swelling.  Gastrointestinal: Negative.  Negative for abdominal pain, blood in stool, constipation, diarrhea, heartburn, melena, nausea and vomiting.  Genitourinary: Negative.  Negative for dysuria and flank pain.   Musculoskeletal: Negative.  Negative for joint pain and myalgias.  Skin: Negative.  Negative for itching and rash.  Neurological: Negative.  Negative for dizziness, tingling, tremors and  headaches.  Endo/Heme/Allergies: Negative.   Psychiatric/Behavioral: Negative.  Negative for depression and suicidal ideas. The patient is not nervous/anxious.         Objective    BP (!) 122/54   Pulse 72   Ht 6' (1.829 m)   Wt 163 lb (73.9 kg)   SpO2 92%   BMI 22.11 kg/m   Physical Exam Vitals and nursing note reviewed.  Constitutional:      General: He is not in acute distress.    Appearance: Normal appearance.  HENT:     Head: Normocephalic and atraumatic.     Nose: Nose normal.     Mouth/Throat:     Mouth: Mucous membranes are moist.     Pharynx: Oropharynx is clear.  Eyes:     Conjunctiva/sclera: Conjunctivae normal.     Pupils: Pupils are equal, round, and reactive to light.  Cardiovascular:     Rate and Rhythm: Normal rate and regular rhythm.     Pulses: Normal pulses.     Heart sounds: Normal heart sounds.  Pulmonary:     Effort: Pulmonary effort is normal.     Breath sounds: Normal breath sounds. No wheezing, rhonchi or rales.  Abdominal:     General: Bowel sounds are normal.     Palpations: Abdomen is soft. There is no mass.     Tenderness: There is no abdominal tenderness. There is no right CVA tenderness, left CVA tenderness, guarding or rebound.     Hernia: No hernia is present.  Musculoskeletal:        General: Normal range of motion.     Cervical back: Normal range of motion.     Right lower leg: No edema.     Left lower leg: No edema.  Skin:    General: Skin is warm and dry.     Findings: No rash.  Neurological:     General: No focal deficit present.     Mental Status: He is alert and oriented to person, place, and time.  Psychiatric:        Mood and Affect: Mood normal.        Behavior: Behavior normal.        Judgment: Judgment normal.         Assessment & Plan:  Patient will get labs done today.  His urine shows lots of pus cells, will send out to lab for culture sensitivity.. Return for lab results.  May also need to start something for anxiety. Flu vaccine today. Problem List Items Addressed This Visit     Hyperlipidemia   Relevant Orders   Lipid Panel w/o Chol/HDL Ratio   POCT Urinalysis Dipstick (81002) (Completed)   POC CREATINE & ALBUMIN,URINE (Completed)   Coronary artery disease   Relevant Orders   CBC with Diff   Other Visit Diagnoses     Essential hypertension, benign    -  Primary   Relevant Orders   CMP14+EGFR   POCT Urinalysis Dipstick (81002) (Completed)   POC CREATINE & ALBUMIN,URINE (Completed)   Urinalysis   Need for immunization against influenza       Relevant Orders   Flu Vaccine Trivalent High Dose (Fluad)   Vitamin D deficiency       Relevant Orders   Vitamin D (25 hydroxy)   Pyuria       Relevant Orders   Urine Culture       Return in about 2 weeks (around 12/05/2022).   Total time spent: 30 minutes  Margaretann Loveless, MD  11/21/2022   This document may have been prepared by [facility] Voice Recognition software and as such may include unintentional dictation errors.

## 2022-11-22 ENCOUNTER — Other Ambulatory Visit: Payer: Self-pay | Admitting: Internal Medicine

## 2022-11-22 DIAGNOSIS — E782 Mixed hyperlipidemia: Secondary | ICD-10-CM

## 2022-11-22 DIAGNOSIS — I25110 Atherosclerotic heart disease of native coronary artery with unstable angina pectoris: Secondary | ICD-10-CM

## 2022-11-22 LAB — CBC WITH DIFFERENTIAL/PLATELET
Basophils Absolute: 0 10*3/uL (ref 0.0–0.2)
Basos: 1 %
EOS (ABSOLUTE): 0.1 10*3/uL (ref 0.0–0.4)
Eos: 1 %
Hematocrit: 41.8 % (ref 37.5–51.0)
Hemoglobin: 13.4 g/dL (ref 13.0–17.7)
Immature Grans (Abs): 0 10*3/uL (ref 0.0–0.1)
Immature Granulocytes: 0 %
Lymphocytes Absolute: 1.2 10*3/uL (ref 0.7–3.1)
Lymphs: 17 %
MCH: 30 pg (ref 26.6–33.0)
MCHC: 32.1 g/dL (ref 31.5–35.7)
MCV: 94 fL (ref 79–97)
Monocytes Absolute: 0.6 10*3/uL (ref 0.1–0.9)
Monocytes: 8 %
Neutrophils Absolute: 5.3 10*3/uL (ref 1.4–7.0)
Neutrophils: 73 %
Platelets: 195 10*3/uL (ref 150–450)
RBC: 4.46 x10E6/uL (ref 4.14–5.80)
RDW: 12.2 % (ref 11.6–15.4)
WBC: 7.3 10*3/uL (ref 3.4–10.8)

## 2022-11-22 LAB — VITAMIN D 25 HYDROXY (VIT D DEFICIENCY, FRACTURES): Vit D, 25-Hydroxy: 35.9 ng/mL (ref 30.0–100.0)

## 2022-11-22 LAB — URINALYSIS
Bilirubin, UA: NEGATIVE
Glucose, UA: NEGATIVE
Ketones, UA: NEGATIVE
Nitrite, UA: NEGATIVE
Protein,UA: NEGATIVE
RBC, UA: NEGATIVE
Specific Gravity, UA: 1.011 (ref 1.005–1.030)
Urobilinogen, Ur: 0.2 mg/dL (ref 0.2–1.0)
pH, UA: 6 (ref 5.0–7.5)

## 2022-11-22 LAB — CMP14+EGFR
ALT: 12 [IU]/L (ref 0–44)
AST: 21 [IU]/L (ref 0–40)
Albumin: 4.4 g/dL (ref 3.7–4.7)
Alkaline Phosphatase: 180 [IU]/L — ABNORMAL HIGH (ref 44–121)
BUN/Creatinine Ratio: 18 (ref 10–24)
BUN: 24 mg/dL (ref 8–27)
Bilirubin Total: 0.7 mg/dL (ref 0.0–1.2)
CO2: 29 mmol/L (ref 20–29)
Calcium: 10 mg/dL (ref 8.6–10.2)
Chloride: 99 mmol/L (ref 96–106)
Creatinine, Ser: 1.33 mg/dL — ABNORMAL HIGH (ref 0.76–1.27)
Globulin, Total: 3.1 g/dL (ref 1.5–4.5)
Glucose: 90 mg/dL (ref 70–99)
Potassium: 4.6 mmol/L (ref 3.5–5.2)
Sodium: 142 mmol/L (ref 134–144)
Total Protein: 7.5 g/dL (ref 6.0–8.5)
eGFR: 54 mL/min/{1.73_m2} — ABNORMAL LOW (ref >59)

## 2022-11-22 LAB — LIPID PANEL W/O CHOL/HDL RATIO
Cholesterol, Total: 283 mg/dL — ABNORMAL HIGH (ref 100–199)
HDL: 45 mg/dL (ref >39)
LDL Chol Calc (NIH): 221 mg/dL — ABNORMAL HIGH (ref 0–99)
Triglycerides: 98 mg/dL (ref 0–149)
VLDL Cholesterol Cal: 17 mg/dL (ref 5–40)

## 2022-11-22 MED ORDER — ATORVASTATIN CALCIUM 80 MG PO TABS: 80.0000 mg | ORAL_TABLET | Freq: Every day | ORAL | 3 refills | Status: DC

## 2022-11-23 DIAGNOSIS — H2511 Age-related nuclear cataract, right eye: Secondary | ICD-10-CM | POA: Diagnosis not present

## 2022-11-23 DIAGNOSIS — H25813 Combined forms of age-related cataract, bilateral: Secondary | ICD-10-CM | POA: Diagnosis not present

## 2022-11-24 ENCOUNTER — Encounter: Payer: Self-pay | Admitting: Cardiovascular Disease

## 2022-11-24 ENCOUNTER — Ambulatory Visit (INDEPENDENT_AMBULATORY_CARE_PROVIDER_SITE_OTHER): Payer: Medicare HMO | Admitting: Cardiovascular Disease

## 2022-11-24 ENCOUNTER — Ambulatory Visit: Payer: Medicare HMO | Admitting: Cardiovascular Disease

## 2022-11-24 ENCOUNTER — Other Ambulatory Visit: Payer: Self-pay | Admitting: Internal Medicine

## 2022-11-24 VITALS — BP 112/84 | HR 69 | Ht 74.0 in | Wt 165.6 lb

## 2022-11-24 DIAGNOSIS — E782 Mixed hyperlipidemia: Secondary | ICD-10-CM

## 2022-11-24 DIAGNOSIS — I10 Essential (primary) hypertension: Secondary | ICD-10-CM | POA: Diagnosis not present

## 2022-11-24 DIAGNOSIS — I5032 Chronic diastolic (congestive) heart failure: Secondary | ICD-10-CM

## 2022-11-24 DIAGNOSIS — Z955 Presence of coronary angioplasty implant and graft: Secondary | ICD-10-CM | POA: Diagnosis not present

## 2022-11-24 DIAGNOSIS — R8281 Pyuria: Secondary | ICD-10-CM

## 2022-11-24 DIAGNOSIS — H2512 Age-related nuclear cataract, left eye: Secondary | ICD-10-CM | POA: Diagnosis not present

## 2022-11-24 LAB — URINE CULTURE

## 2022-11-24 MED ORDER — AMOXICILLIN-POT CLAVULANATE 500-125 MG PO TABS: 1.0000 | ORAL_TABLET | Freq: Two times a day (BID) | ORAL | 0 refills | Status: DC

## 2022-11-24 NOTE — Progress Notes (Signed)
Cardiology Office Note   Date:  11/24/2022   ID:  [name], DOB 07-Mar-1940, MRN [id number]  PCP:  Margaretann Loveless, MD  Cardiologist:  Adrian Blackwater, MD      History of Present Illness: [name] Browning is a 82 y.o. male who presents for  Chief Complaint  Patient presents with   Follow-up    Echo and NST results    Had cataract surgery yesterday      Past Medical History:  Diagnosis Date   Anginal pain (HCC)    Anxiety    Aortic atherosclerosis (HCC)    Atrial fibrillation (HCC) 01/2019   Bilateral carpal tunnel syndrome    CAD (coronary artery disease)    a.) LHC 2004 --> normal coronaries. b.) normal stress test in 2007 and 2011; c.) Lexiscan 05/20/2014 --> LVEF 55-65%; no significant stress induced ischemia/arrythmia. d.) CT chest 03/25/2019 --> coronaries carcified.   Carotid atherosclerosis, bilateral    Carpal tunnel syndrome, bilateral    CHF (congestive heart failure) (HCC)    Chronic anticoagulation    a.) ASA + apixaban   COPD (chronic obstructive pulmonary disease) (HCC)    CVA (cerebral vascular accident) (HCC)    Degenerative disc disease, cervical    Diastolic dysfunction    a.) TTE 05/29/2014 --> LVEF 60-65%; G1DD.   DVT (deep venous thrombosis) (HCC)    GERD (gastroesophageal reflux disease)    History of 2019 novel coronavirus disease (COVID-19) 02/08/2019   HLD (hyperlipidemia)    Hypertension    Kidney stones    Osteoarthritis of right shoulder    Pneumonia    Respiratory failure, acute (HCC) 09/20/2020   a.) severe respiratory distress 1 hour after urological surgery. CXR (+) for acute pulmonary edema. Transferred to ICU and placed on NIPPV. Questionable aspiration PNA. (+) A.fib with RVR. Improved with ABX, diuresis, and amiodarone.   Skin cancer of face    a.) RIGHT ear and RIGHT forehead; excised.   Syncope    TIA (transient ischemic attack) 2016   Valvular regurgitation    a.) TTE 05/26/2014 --> LVEF 60-65%; trivial MR, mild TR; no  AR or PR. b.) TTE 04/20/2015 --> LVEF 55-60%; trivial MR and PR; no AR or TR.     Past Surgical History:  Procedure Laterality Date   CARDIAC CATHETERIZATION  2004   CARPAL TUNNEL RELEASE Right 06/11/2013   CENTRAL LINE INSERTION  11/14/2020   Procedure: CENTRAL LINE INSERTION;  Surgeon: Marykay Lex, MD;  Location: [facility] INVASIVE CV LAB;  Service: Cardiovascular;;   CORONARY/GRAFT ACUTE MI REVASCULARIZATION N/A 11/14/2020   Procedure: Coronary/Graft Acute MI Revascularization;  Surgeon: Marykay Lex, MD;  Location: [facility] INVASIVE CV LAB;  Service: Cardiovascular;  Laterality: N/A;   CYSTOSCOPY W/ RETROGRADES  10/16/2020   Procedure: CYSTOSCOPY WITH RETROGRADE PYELOGRAM;  Surgeon: Sondra Come, MD;  Location: [facility] ORS;  Service: Urology;;   CYSTOSCOPY W/ URETERAL STENT PLACEMENT Left 09/20/2020   Procedure: CYSTOSCOPY WITH RETROGRADE PYELOGRAM/URETERAL STENT PLACEMENT;  Surgeon: Jannifer Hick, MD;  Location: [facility] ORS;  Service: Urology;  Laterality: Left;   CYSTOSCOPY/URETEROSCOPY/HOLMIUM LASER/STENT PLACEMENT Left 10/16/2020   Procedure: CYSTOSCOPY/URETEROSCOPY/HOLMIUM LASER/STENT PLACEMENT;  Surgeon: Sondra Come, MD;  Location: [facility] ORS;  Service: Urology;  Laterality: Left;   ESOPHAGOGASTRODUODENOSCOPY N/A 11/06/2020   Procedure: ESOPHAGOGASTRODUODENOSCOPY (EGD);  Surgeon: Midge Minium, MD;  Location: [facility] ENDOSCOPY;  Service: Endoscopy;  Laterality: N/A;   LEFT HEART CATH AND CORONARY ANGIOGRAPHY N/A 11/14/2020   Procedure: LEFT  HEART CATH AND CORONARY ANGIOGRAPHY;  Surgeon: Marykay Lex, MD;  Location: [facility] INVASIVE CV LAB;  Service: Cardiovascular;  Laterality: N/A;   LEFT HEART CATH AND CORONARY ANGIOGRAPHY Left 01/28/2022   Procedure: LEFT HEART CATH AND CORONARY ANGIOGRAPHY;  Surgeon: Laurier Nancy, MD;  Location: [facility] INVASIVE CV LAB;  Service: Cardiovascular;  Laterality: Left;   SHOULDER ARTHROSCOPY WITH OPEN ROTATOR CUFF REPAIR Right 08/24/2017   Procedure: SHOULDER  ARTHROSCOPY WITH OPEN ROTATOR CUFF REPAIR;  Surgeon: Christena Flake, MD;  Location: [facility] ORS;  Service: Orthopedics;  Laterality: Right;   SKIN CANCER EXCISION  12/01/2016   right ear    SKIN CANCER EXCISION     remove from the right side of the face    THROMBECTOMY Right 2004   leg   THYROIDECTOMY  1950   Not sure if total or partial thyroidectomy.     Current Outpatient Medications  Medication Sig Dispense Refill   acetaminophen (TYLENOL) 500 MG tablet Take 2 tablets (1,000 mg total) by mouth every 6 (six) hours as needed for mild pain.     aspirin EC 81 MG tablet Take 81 mg by mouth daily after supper.     atorvastatin (LIPITOR) 80 MG tablet Take 1 tablet (80 mg total) by mouth daily. 90 tablet 3   Budeson-Glycopyrrol-Formoterol (BREZTRI AEROSPHERE) 160-9-4.8 MCG/ACT AERO Inhale 2 puffs into the lungs in the morning and at bedtime. 10.7 g 12   cetirizine (ZYRTEC) 10 MG tablet Take 10 mg by mouth daily.     clopidogrel (PLAVIX) 75 MG tablet TAKE 1 TABLET BY MOUTH EVERY DAY 90 tablet 3   famotidine (PEPCID) 20 MG tablet TAKE 1 TABLET BY MOUTH AFTER SUPPER 90 tablet 3   ferrous gluconate (FERGON) 240 (27 FE) MG tablet Take 240 mg by mouth 3 (three) times daily with meals.     finasteride (PROSCAR) 5 MG tablet Take 1 tablet (5 mg total) by mouth daily. 90 tablet 3   hyoscyamine (ANASPAZ) 0.125 MG TBDP disintergrating tablet Place 1 tablet (0.125 mg total) under the tongue every 6 (six) hours as needed. 120 tablet 0   isosorbide mononitrate (IMDUR) 30 MG 24 hr tablet Take 1 tablet (30 mg total) by mouth 2 (two) times daily. 60 tablet 2   KLOR-CON M20 20 MEQ tablet TAKE 1 TABLET BY MOUTH EVERY DAY 90 tablet 3   losartan (COZAAR) 50 MG tablet TAKE 1 TABLET BY MOUTH EVERY DAY 90 tablet 1   Meth-Hyo-M Bl-Na Phos-Ph Sal (URIBEL) 118 MG CAPS Take 1 capsule (118 mg total) by mouth every 6 (six) hours as needed. 30 capsule 0   methenamine (HIPREX) 1 g tablet Take 1 tablet (1 g total) by mouth 2  (two) times daily with a meal. 60 tablet 3   metoprolol succinate (TOPROL XL) 25 MG 24 hr tablet Take 1 tablet (25 mg total) by mouth daily. 30 tablet 11   montelukast (SINGULAIR) 10 MG tablet Take 10 mg by mouth at bedtime.     moxifloxacin (VIGAMOX) 0.5 % ophthalmic solution INSTILL 1 DROP INTO RIGHT EYE 4 TIMES A DAY AS DIRECTED     ondansetron (ZOFRAN-ODT) 4 MG disintegrating tablet Take 1 tablet (4 mg total) by mouth every 6 (six) hours as needed for nausea or vomiting. 20 tablet 0   pantoprazole (PROTONIX) 40 MG tablet TAKE 1 TABLET BY MOUTH TWICE A DAY 180 tablet 1   prednisoLONE acetate (PRED FORTE) 1 % ophthalmic suspension INSTILL 1 DROP INTO RIGHT  EYE FOUR TIMES A DAY AS DIRECTED PLEASE SHAKE WELL BEFORE EACH USE!!     ranolazine (RANEXA) 1000 MG SR tablet TAKE 1 TABLET BY MOUTH TWICE A DAY 180 tablet 1   roflumilast (DALIRESP) 500 MCG TABS tablet Take 500 mcg by mouth daily.     tamsulosin (FLOMAX) 0.4 MG CAPS capsule Take 1 capsule (0.4 mg total) by mouth daily. 30 capsule 11   torsemide (DEMADEX) 20 MG tablet TAKE 3 TABLETS BY MOUTH EVERY DAY 270 tablet 3   nitrofurantoin, macrocrystal-monohydrate, (MACROBID) 100 MG capsule Take 1 capsule (100 mg total) by mouth every 12 (twelve) hours. (Patient not taking: Reported on 11/24/2022) 14 capsule 0   No current facility-administered medications for this visit.    Allergies:   Hydromorphone    Social History:   reports that he quit smoking about 19 years ago. His smoking use included cigarettes. He started smoking about 65 years ago. He has a 46.1 pack-year smoking history. He has been exposed to tobacco smoke. He quit smokeless tobacco use about 19 years ago.  His smokeless tobacco use included snuff. He reports that he does not currently use alcohol after a past usage of about 7.0 standard drinks of alcohol per week. He reports that he does not use drugs.   Family History:  family history includes CAD in his father; Heart attack in his  father; Heart disease in his mother; Hypertension in his mother.    ROS:     Review of Systems  Constitutional: Negative.   HENT: Negative.    Eyes: Negative.   Respiratory: Negative.    Gastrointestinal: Negative.   Genitourinary: Negative.   Musculoskeletal: Negative.   Skin: Negative.   Neurological: Negative.   Endo/Heme/Allergies: Negative.   Psychiatric/Behavioral: Negative.    All other systems reviewed and are negative.     All other systems are reviewed and negative.    PHYSICAL EXAM: VS:  BP 112/84   Pulse 69   Ht 6\' 2"  (1.88 m)   Wt 165 lb 9.6 oz (75.1 kg)   SpO2 94%   BMI 21.26 kg/m  , BMI Body mass index is 21.26 kg/m. Last weight:  Wt Readings from Last 3 Encounters:  11/24/22 165 lb 9.6 oz (75.1 kg)  11/21/22 163 lb (73.9 kg)  10/31/22 163 lb (73.9 kg)     Physical Exam Vitals reviewed.  Constitutional:      Appearance: Normal appearance. He is normal weight.  HENT:     Head: Normocephalic.     Nose: Nose normal.     Mouth/Throat:     Mouth: Mucous membranes are moist.  Eyes:     Pupils: Pupils are equal, round, and reactive to light.  Cardiovascular:     Rate and Rhythm: Normal rate and regular rhythm.     Pulses: Normal pulses.     Heart sounds: Normal heart sounds.  Pulmonary:     Effort: Pulmonary effort is normal.  Abdominal:     General: Abdomen is flat. Bowel sounds are normal.  Musculoskeletal:        General: Normal range of motion.     Cervical back: Normal range of motion.  Skin:    General: Skin is warm.  Neurological:     General: No focal deficit present.     Mental Status: He is alert.  Psychiatric:        Mood and Affect: Mood normal.       EKG:   Recent Labs: 03/06/2022:  Magnesium 2.1 11/21/2022: ALT 12; BUN 24; Creatinine, Ser 1.33; Hemoglobin 13.4; Platelets 195; Potassium 4.6; Sodium 142    Lipid Panel    Component Value Date/Time   CHOL 283 (H) 11/21/2022 1030   TRIG 98 11/21/2022 1030   HDL 45  11/21/2022 1030   CHOLHDL 3.7 11/12/2020 0435   VLDL 9 11/12/2020 0435   LDLCALC 221 (H) 11/21/2022 1030   LDLCALC 105 (H) 12/07/2016 3664      Other studies Reviewed: Additional studies/ records that were reviewed today include:  Review of the above records demonstrates:       No data to display            ASSESSMENT AND PLAN:    ICD-10-CM   1. S/P coronary artery stent placement  Z95.5    No chest pain, continue asp/plavix after catract surgery. Stress test normal, echo showed was in afib and moderate MR/Tr    2. Mixed hyperlipidemia  E78.2     3. Essential hypertension, benign  I10     4. Chronic heart failure with preserved ejection fraction (HCC)  I50.32        Problem List Items Addressed This Visit       Cardiovascular and Mediastinum   (HFpEF) heart failure with preserved ejection fraction (HCC)     Other   Hyperlipidemia   S/P coronary artery stent placement - Primary   Other Visit Diagnoses     Essential hypertension, benign              Disposition:   Return in about 3 months (around 02/24/2023).    Total time spent: 30 minutes  Signed,  Adrian Blackwater, MD  11/24/2022 1:14 PM    [facility]

## 2022-11-28 ENCOUNTER — Other Ambulatory Visit: Payer: Medicare HMO

## 2022-11-30 ENCOUNTER — Encounter: Payer: Self-pay | Admitting: Internal Medicine

## 2022-11-30 ENCOUNTER — Telehealth: Payer: Self-pay

## 2022-11-30 ENCOUNTER — Ambulatory Visit: Payer: Medicare HMO | Admitting: Internal Medicine

## 2022-11-30 VITALS — BP 104/60 | HR 61 | Temp 97.8°F | Ht 74.0 in | Wt 165.8 lb

## 2022-11-30 DIAGNOSIS — J449 Chronic obstructive pulmonary disease, unspecified: Secondary | ICD-10-CM

## 2022-11-30 DIAGNOSIS — J9611 Chronic respiratory failure with hypoxia: Secondary | ICD-10-CM

## 2022-11-30 DIAGNOSIS — R0689 Other abnormalities of breathing: Secondary | ICD-10-CM

## 2022-11-30 MED ORDER — OHTUVAYRE 3 MG/2.5ML IN SUSP: 2.5000 mL | Freq: Two times a day (BID) | RESPIRATORY_TRACT | Status: AC

## 2022-11-30 NOTE — Telephone Encounter (Signed)
[place] Rx form completed and faxed to [facility] pharmacy team.

## 2022-11-30 NOTE — Progress Notes (Signed)
[name] Browning, male    DOB: March 20, 1940,  MRN: [id number]   SYNOPSIS 82 year old male, former smoker followed for COPD with chronic bronchitis and emphysema and chronic respiratory failure on supplemental O2.  Past medical history significant for HTN, PAF, CHF, hx of DVT, hx of STEMI, allergic rhinitis, GERD, CKD, BPH, HLD. PREVIOUS ADMISSIONS First admission was from 10/20/2020 to 10/31/2020 when he was admitted with respiratory failure due to COPD and pneumonia, as well as an obstructive left renal stone.  He developed hypotension and worsening respiratory distress requiring pressors and BiPAP.     Blood cultures grew positive for Enterococcus faecalis and staph hemolyticus.  He was treated with antibiotics ending 11/03/2020.  The patient was recently admitted to the hospital from 10/25-10/28 with complaints of dysphagia.     He was seen by gastroenterology who underwent an EGD with no significant findings.  Since that time he has had progressive worsening shortness of breath, eventually presented to the emergency department where he was found to have an oxygen saturation of 88% on his home 2 L of oxygen.  His labs are otherwise notable for a troponin of 620, BNP of 220, hemoglobin of 8.2 (9.7 on 11/06/2020).  Chest x-ray shows diffuse bilateral infiltrates which could be consistent with edema but some overlying scar.   TESTS/EVENTS: 11/15/2020 echo: EF 60 to 65%.  G1 DD.  RV function severely reduced and enlarged.  LA severely dilated.  RA severely dilated.  Mild MR. 07/03/2022 CTA chest: No PE.  Atherosclerosis/CAD.  Cardiomegaly.  Emphysema.  Nodular biapical scarring, unchanged.  Mild bilateral gynecomastia  06/09/2022: OV Significant SOB/DOE. Chronic productive cough/chest congestion over the last 8 months. Several hospitalizations over the last year. Progressive respiratory disease. Chronic hypoxic respiratory failure; currently on 5 lpm. COPD progressed to end stage with previous infections  and hospitalizations. Started on [employer] inhaler previously. Was also given 20 mg daily at last OV. Started on daliresp. Recommended pulmonary rehab.        CC Follow-up assessment for COPD Follow-up assessment of chronic hypoxic respiratory failure Follow-up assessment for respiratory insufficiency    HPI Significant shortness of breath and dyspnea on exertion Has chronic productive cough and chest congestion over the last 8 months Several hospitalizations over the last 1 year  No exacerbation at this time No evidence of heart failure at this time No evidence or signs of infection at this time No respiratory distress No fevers, chills, nausea, vomiting, diarrhea No evidence of lower extremity edema No evidence hemoptysis   Chronic Hypoxic resp failure due to COPD Patient has weaned himself off oxygen Patient will need to be assessed with overnight pulse oximetry   Past Medical History:  Diagnosis Date   Anginal pain (HCC)    Anxiety    Aortic atherosclerosis (HCC)    Atrial fibrillation (HCC) 01/2019   Bilateral carpal tunnel syndrome    CAD (coronary artery disease)    a.) LHC 2004 --> normal coronaries. b.) normal stress test in 2007 and 2011; c.) Lexiscan 05/20/2014 --> LVEF 55-65%; no significant stress induced ischemia/arrythmia. d.) CT chest 03/25/2019 --> coronaries carcified.   Carotid atherosclerosis, bilateral    Carpal tunnel syndrome, bilateral    CHF (congestive heart failure) (HCC)    Chronic anticoagulation    a.) ASA + apixaban   COPD (chronic obstructive pulmonary disease) (HCC)    CVA (cerebral vascular accident) (HCC)    Degenerative disc disease, cervical    Diastolic dysfunction  a.) TTE 05/29/2014 --> LVEF 60-65%; G1DD.   DVT (deep venous thrombosis) (HCC)    GERD (gastroesophageal reflux disease)    History of 2019 novel coronavirus disease (COVID-19) 02/08/2019   HLD (hyperlipidemia)    Hypertension    Kidney stones    Osteoarthritis  of right shoulder    Pneumonia    Respiratory failure, acute (HCC) 09/20/2020   a.) severe respiratory distress 1 hour after urological surgery. CXR (+) for acute pulmonary edema. Transferred to ICU and placed on NIPPV. Questionable aspiration PNA. (+) A.fib with RVR. Improved with ABX, diuresis, and amiodarone.   Skin cancer of face    a.) RIGHT ear and RIGHT forehead; excised.   Syncope    TIA (transient ischemic attack) 2016   Valvular regurgitation    a.) TTE 05/26/2014 --> LVEF 60-65%; trivial MR, mild TR; no AR or PR. b.) TTE 04/20/2015 --> LVEF 55-60%; trivial MR and PR; no AR or TR.    Outpatient Medications Prior to Visit  Medication Sig Dispense Refill   acetaminophen (TYLENOL) 500 MG tablet Take 2 tablets (1,000 mg total) by mouth every 6 (six) hours as needed for mild pain.     amLODipine (NORVASC) 5 MG tablet Take 1 tablet (5 mg total) by mouth daily after supper. 30 tablet 2   apixaban (ELIQUIS) 5 MG TABS tablet Take 1 tablet (5 mg total) by mouth 2 (two) times daily. 60 tablet 2   aspirin EC 81 MG tablet Take 81 mg by mouth daily after supper.     atorvastatin (LIPITOR) 40 MG tablet Take 40 mg by mouth daily after supper.     Ferrous Gluconate (IRON) 240 (27 Fe) MG TABS Take 27 mg by mouth daily after supper.     furosemide (LASIX) 20 MG tablet Take 1 tablet (20 mg total) by mouth daily after breakfast. 30 tablet 2   guaiFENesin-dextromethorphan (ROBITUSSIN DM) 100-10 MG/5ML syrup Take 5 mLs by mouth every 4 (four) hours as needed for cough. 118 mL 0   losartan (COZAAR) 100 MG tablet Take 100 mg by mouth daily after supper.     montelukast (SINGULAIR) 10 MG tablet Take 10 mg by mouth at bedtime.     pantoprazole (PROTONIX) 40 MG tablet Take 40 mg by mouth daily after supper.     phenazopyridine (PYRIDIUM) 200 MG tablet Take 1 tablet (200 mg total) by mouth 3 (three) times daily as needed (bladder pain). 10 tablet 0   polyethylene glycol (MIRALAX / GLYCOLAX) 17 g packet Take  17 g by mouth daily as needed for mild constipation or moderate constipation. (Patient taking differently: Take 17 g by mouth daily.) 14 each 0                  No facility-administered medications prior to visit.     BP 104/60 (BP Location: Left Arm, Patient Position: Sitting, Cuff Size: Normal)   Pulse 61   Temp 97.8 F (36.6 C) (Temporal)   Ht 6\' 2"  (1.88 m)   Wt 165 lb 12.8 oz (75.2 kg)   SpO2 95%   BMI 21.29 kg/m    Review of Systems: Gen:  Denies  fever, sweats, chills weight loss  HEENT: Denies blurred vision, double vision, ear pain, eye pain, hearing loss, nose bleeds, sore throat Cardiac:  No dizziness, chest pain or heaviness, chest tightness,edema, No JVD Resp:   + cough, -sputum production, +shortness of breath,+wheezing, -hemoptysis,  Other:  All other systems negative   Physical  Examination:   General Appearance: No distress  EYES PERRLA, EOM intact.   NECK Supple, No JVD Pulmonary: normal breath sounds, + wheezing.  CardiovascularNormal S1,S2.  No m/r/g.   Abdomen: Benign, Soft, non-tender. Neurology UE/LE 5/5 strength, no focal deficits Ext pulses intact, cap refill intact ALL OTHER ROS ARE NEGATIVE    Labs ordered/ reviewed:      Chemistry      Component Value Date/Time   NA 142 11/21/2022 1030   NA 144 08/12/2013 0546   K 4.6 11/21/2022 1030   K 4.5 08/12/2013 0546   CL 99 11/21/2022 1030   CL 113 (H) 08/12/2013 0546   CO2 29 11/21/2022 1030   CO2 26 08/12/2013 0546   BUN 24 11/21/2022 1030   BUN 25 (H) 08/12/2013 0546   CREATININE 1.33 (H) 11/21/2022 1030   CREATININE 1.00 06/08/2016 1044      Component Value Date/Time   CALCIUM 10.0 11/21/2022 1030   CALCIUM 8.3 (L) 08/12/2013 0546   ALKPHOS 180 (H) 11/21/2022 1030   ALKPHOS 112 08/10/2013 1703   AST 21 11/21/2022 1030   AST 27 08/10/2013 1703   ALT 12 11/21/2022 1030   ALT 22 08/10/2013 1703   BILITOT 0.7 11/21/2022 1030   BILITOT 0.7 08/10/2013 1703             Assessment   82 year old pleasant white male seen today for follow-up assessment with COPD with several bouts of COVID-19 infection last several years with progressive respiratory insufficiency earlier with diastolic dysfunction     COPD  Progression to end-stage with previous infections and hospitalizations  Previously started on Brez[employer] inhaler therapy  Using as needed Will plan to start [employer] Nebulizer Inhaler 2 NEBS twice daily Verbal and written consent to be obtained  Chronic hypoxic respiratory failure Continue oxygen as prescribed Patient uses and benefits from oxygen therapy He needs this for survival   Severe respiratory insufficiency   COPD with chronic bronchitis and emphysema Recurrent bouts of COPD exacerbation Patient started on Daliresp but he does NOT know if he is taking  this medication    MEDICATION ADJUSTMENTS/LABS AND TESTS ORDERED:   CURRENT MEDICATIONS REVIEWED AT LENGTH WITH PATIENT TODAY   Patient  satisfied with Plan of action and management. All questions answered  Follow-up in 6 months  Total Time Spent 42 minutes   Lucie Leather, M.D.  [facility]  Medical Director [facility] Medical Director [facility] Cardio-Pulmonary Department

## 2022-11-30 NOTE — Progress Notes (Signed)
Patient notified

## 2022-11-30 NOTE — Patient Instructions (Addendum)
Continue Oxygen as prescribed  Will plan to start [facility] Nebulizer Inhaler 2 NEBS twice daily  Assess if you need oxygen at night   Avoid secondhand smoke Avoid SICK contacts Recommend  Masking  when appropriate Recommend Keep up-to-date with vaccinations

## 2022-12-05 ENCOUNTER — Encounter: Payer: Self-pay | Admitting: Internal Medicine

## 2022-12-05 ENCOUNTER — Ambulatory Visit: Payer: Medicare HMO | Admitting: Cardiovascular Disease

## 2022-12-05 ENCOUNTER — Ambulatory Visit (INDEPENDENT_AMBULATORY_CARE_PROVIDER_SITE_OTHER): Payer: Medicare HMO | Admitting: Internal Medicine

## 2022-12-05 VITALS — BP 120/70 | HR 84 | Ht 74.0 in | Wt 161.6 lb

## 2022-12-05 DIAGNOSIS — N401 Enlarged prostate with lower urinary tract symptoms: Secondary | ICD-10-CM | POA: Diagnosis not present

## 2022-12-05 DIAGNOSIS — F411 Generalized anxiety disorder: Secondary | ICD-10-CM | POA: Insufficient documentation

## 2022-12-05 DIAGNOSIS — I25110 Atherosclerotic heart disease of native coronary artery with unstable angina pectoris: Secondary | ICD-10-CM | POA: Diagnosis not present

## 2022-12-05 DIAGNOSIS — E782 Mixed hyperlipidemia: Secondary | ICD-10-CM | POA: Diagnosis not present

## 2022-12-05 DIAGNOSIS — R748 Abnormal levels of other serum enzymes: Secondary | ICD-10-CM | POA: Diagnosis not present

## 2022-12-05 DIAGNOSIS — I10 Essential (primary) hypertension: Secondary | ICD-10-CM | POA: Diagnosis not present

## 2022-12-05 DIAGNOSIS — I5033 Acute on chronic diastolic (congestive) heart failure: Secondary | ICD-10-CM

## 2022-12-05 MED ORDER — ATORVASTATIN CALCIUM 80 MG PO TABS: 80.0000 mg | ORAL_TABLET | Freq: Every day | ORAL | 3 refills | Status: DC

## 2022-12-05 MED ORDER — DULOXETINE HCL 30 MG PO CPEP: 30.0000 mg | ORAL_CAPSULE | Freq: Every day | ORAL | 2 refills | Status: DC

## 2022-12-05 NOTE — Progress Notes (Signed)
Established Patient Office Visit  Subjective:  Patient ID: [name] Browning, male    DOB: August 18, 1940  Age: 82 y.o. MRN: [id number]  Chief Complaint  Patient presents with   Follow-up    2 week    Patient comes in for his follow-up today.  He is very hard of hearing.  Generally feels well but reports that he was not able to take an antibiotic for his UTI.  He has longstanding history of bladder pain and enlarged prostate, under care of urologist.  He mentions that he has to take his diuretic daily to have a good urine output and then his bladder pain goes away.   His labs showed an elevated LDL, admits that he ran out of his Lipitor and needs a new prescription.  One of his liver enzyme,Alk phos  is elevated, but he is most recent CT scan which showed normal liver parenchyma. Patient admits to anxiety, his PHQ-9/GAD score was 13/9.  Agrees to start Cymbalta.  Prescription sent for 30 mg/day.    No other concerns at this time.   Past Medical History:  Diagnosis Date   Anginal pain (HCC)    Anxiety    Aortic atherosclerosis (HCC)    Atrial fibrillation (HCC) 01/2019   Bilateral carpal tunnel syndrome    CAD (coronary artery disease)    a.) LHC 2004 --> normal coronaries. b.) normal stress test in 2007 and 2011; c.) Lexiscan 05/20/2014 --> LVEF 55-65%; no significant stress induced ischemia/arrythmia. d.) CT chest 03/25/2019 --> coronaries carcified.   Carotid atherosclerosis, bilateral    Carpal tunnel syndrome, bilateral    CHF (congestive heart failure) (HCC)    Chronic anticoagulation    a.) ASA + apixaban   COPD (chronic obstructive pulmonary disease) (HCC)    CVA (cerebral vascular accident) (HCC)    Degenerative disc disease, cervical    Diastolic dysfunction    a.) TTE 05/29/2014 --> LVEF 60-65%; G1DD.   DVT (deep venous thrombosis) (HCC)    GERD (gastroesophageal reflux disease)    History of 2019 novel coronavirus disease (COVID-19) 02/08/2019   HLD (hyperlipidemia)     Hypertension    Kidney stones    Osteoarthritis of right shoulder    Pneumonia    Respiratory failure, acute (HCC) 09/20/2020   a.) severe respiratory distress 1 hour after urological surgery. CXR (+) for acute pulmonary edema. Transferred to ICU and placed on NIPPV. Questionable aspiration PNA. (+) A.fib with RVR. Improved with ABX, diuresis, and amiodarone.   Skin cancer of face    a.) RIGHT ear and RIGHT forehead; excised.   Syncope    TIA (transient ischemic attack) 2016   Valvular regurgitation    a.) TTE 05/26/2014 --> LVEF 60-65%; trivial MR, mild TR; no AR or PR. b.) TTE 04/20/2015 --> LVEF 55-60%; trivial MR and PR; no AR or TR.    Past Surgical History:  Procedure Laterality Date   CARDIAC CATHETERIZATION  2004   CARPAL TUNNEL RELEASE Right 06/11/2013   CENTRAL LINE INSERTION  11/14/2020   Procedure: CENTRAL LINE INSERTION;  Surgeon: Marykay Lex, MD;  Location: [facility] INVASIVE CV LAB;  Service: Cardiovascular;;   CORONARY/GRAFT ACUTE MI REVASCULARIZATION N/A 11/14/2020   Procedure: Coronary/Graft Acute MI Revascularization;  Surgeon: Marykay Lex, MD;  Location: [facility] INVASIVE CV LAB;  Service: Cardiovascular;  Laterality: N/A;   CYSTOSCOPY W/ RETROGRADES  10/16/2020   Procedure: CYSTOSCOPY WITH RETROGRADE PYELOGRAM;  Surgeon: Sondra Come, MD;  Location: [facility] ORS;  Service: Urology;;   CYSTOSCOPY W/ URETERAL STENT PLACEMENT Left 09/20/2020   Procedure: CYSTOSCOPY WITH RETROGRADE PYELOGRAM/URETERAL STENT PLACEMENT;  Surgeon: Jannifer Hick, MD;  Location: [facility] ORS;  Service: Urology;  Laterality: Left;   CYSTOSCOPY/URETEROSCOPY/HOLMIUM LASER/STENT PLACEMENT Left 10/16/2020   Procedure: CYSTOSCOPY/URETEROSCOPY/HOLMIUM LASER/STENT PLACEMENT;  Surgeon: Sondra Come, MD;  Location: [facility] ORS;  Service: Urology;  Laterality: Left;   ESOPHAGOGASTRODUODENOSCOPY N/A 11/06/2020   Procedure: ESOPHAGOGASTRODUODENOSCOPY (EGD);  Surgeon: Midge Minium, MD;  Location[facility] ENDOSCOPY;   Service: Endoscopy;  Laterality: N/A;   LEFT HEART CATH AND CORONARY ANGIOGRAPHY N/A 11/14/2020   Procedure: LEFT HEART CATH AND CORONARY ANGIOGRAPHY;  Surgeon: Marykay Lex, MD;  Location: [facility] INVASIVE CV LAB;  Service: Cardiovascular;  Laterality: N/A;   LEFT HEART CATH AND CORONARY ANGIOGRAPHY Left 01/28/2022   Procedure: LEFT HEART CATH AND CORONARY ANGIOGRAPHY;  Surgeon: Laurier Nancy, MD;  Location: [facility] INVASIVE CV LAB;  Service: Cardiovascular;  Laterality: Left;   SHOULDER ARTHROSCOPY WITH OPEN ROTATOR CUFF REPAIR Right 08/24/2017   Procedure: SHOULDER ARTHROSCOPY WITH OPEN ROTATOR CUFF REPAIR;  Surgeon: Christena Flake, MD;  Location: [facility] ORS;  Service: Orthopedics;  Laterality: Right;   SKIN CANCER EXCISION  12/01/2016   right ear    SKIN CANCER EXCISION     remove from the right side of the face    THROMBECTOMY Right 2004   leg   THYROIDECTOMY  1950   Not sure if total or partial thyroidectomy.    Social History   Socioeconomic History   Marital status: Legally Separated    Spouse name: Not on file   Number of children: 1   Years of education: Not on file   Highest education level: Not on file  Occupational History   Not on file  Tobacco Use   Smoking status: Former    Current packs/day: 0.00    Average packs/day: 1 pack/day for 46.1 years (46.1 ttl pk-yrs)    Types: Cigarettes    Start date: 01/10/1957    Quit date: 02/11/2003    Years since quitting: 19.8    Passive exposure: Past   Smokeless tobacco: Former    Types: Snuff    Quit date: 02/11/2003  Vaping Use   Vaping status: Never Used  Substance and Sexual Activity   Alcohol use: Not Currently    Alcohol/week: 7.0 standard drinks of alcohol    Types: 7 Cans of beer per week   Drug use: No   Sexual activity: Yes    Partners: Female  Other Topics Concern   Not on file  Social History Narrative   Live with grandaughter, April. 1 son that lives in [place].   Social Determinants of Health   Financial  Resource Strain: Not on file  Food Insecurity: No Food Insecurity (05/20/2022)   Hunger Vital Sign    Worried About Running Out of Food in the Last Year: Never true    Ran Out of Food in the Last Year: Never true  Transportation Needs: No Transportation Needs (05/20/2022)   PRAPARE - Administrator, Civil Service (Medical): No    Lack of Transportation (Non-Medical): No  Physical Activity: Not on file  Stress: Not on file  Social Connections: Not on file  Intimate Partner Violence: Not At Risk (03/05/2022)   Humiliation, Afraid, Rape, and Kick questionnaire    Fear of Current or Ex-Partner: No    Emotionally Abused: No    Physically Abused: No    Sexually  Abused: No    Family History  Problem Relation Age of Onset   Hypertension Mother    Heart disease Mother    CAD Father    Heart attack Father     Allergies  Allergen Reactions   Hydromorphone     Hallucination Pt reports he doesn't know about this    Outpatient Medications Prior to Visit  Medication Sig   acetaminophen (TYLENOL) 500 MG tablet Take 2 tablets (1,000 mg total) by mouth every 6 (six) hours as needed for mild pain.   aspirin EC 81 MG tablet Take 81 mg by mouth daily after supper.   Budeson-Glycopyrrol-Formoterol (BREZTRI AEROSPHERE) 160-9-4.8 MCG/ACT AERO Inhale 2 puffs into the lungs in the morning and at bedtime.   clopidogrel (PLAVIX) 75 MG tablet TAKE 1 TABLET BY MOUTH EVERY DAY   Ensifentrine (OHTUVAYRE) 3 MG/2.5ML SUSP Inhale 2.5 mLs into the lungs 2 (two) times daily.   famotidine (PEPCID) 20 MG tablet TAKE 1 TABLET BY MOUTH AFTER SUPPER   ferrous gluconate (FERGON) 240 (27 FE) MG tablet Take 240 mg by mouth 3 (three) times daily with meals.   finasteride (PROSCAR) 5 MG tablet Take 1 tablet (5 mg total) by mouth daily.   hyoscyamine (ANASPAZ) 0.125 MG TBDP disintergrating tablet Place 1 tablet (0.125 mg total) under the tongue every 6 (six) hours as needed.   isosorbide mononitrate (IMDUR) 30  MG 24 hr tablet Take 1 tablet (30 mg total) by mouth 2 (two) times daily.   ketorolac (ACULAR) 0.5 % ophthalmic solution Place 1 drop into the left eye 4 (four) times daily.   KLOR-CON M20 20 MEQ tablet TAKE 1 TABLET BY MOUTH EVERY DAY   losartan (COZAAR) 50 MG tablet TAKE 1 TABLET BY MOUTH EVERY DAY   Meth-Hyo-M Bl-Na Phos-Ph Sal (URIBEL) 118 MG CAPS Take 1 capsule (118 mg total) by mouth every 6 (six) hours as needed.   methenamine (HIPREX) 1 g tablet Take 1 tablet (1 g total) by mouth 2 (two) times daily with a meal.   metoprolol succinate (TOPROL XL) 25 MG 24 hr tablet Take 1 tablet (25 mg total) by mouth daily.   montelukast (SINGULAIR) 10 MG tablet Take 10 mg by mouth at bedtime.   ondansetron (ZOFRAN-ODT) 4 MG disintegrating tablet Take 1 tablet (4 mg total) by mouth every 6 (six) hours as needed for nausea or vomiting.   prednisoLONE acetate (PRED FORTE) 1 % ophthalmic suspension INSTILL 1 DROP INTO RIGHT EYE FOUR TIMES A DAY AS DIRECTED PLEASE SHAKE WELL BEFORE EACH USE!!   ranolazine (RANEXA) 1000 MG SR tablet TAKE 1 TABLET BY MOUTH TWICE A DAY   roflumilast (DALIRESP) 500 MCG TABS tablet Take 500 mcg by mouth daily.   sulfacetamide (BLEPH-10) 10 % ophthalmic solution SMARTSIG:In Eye(s)   tamsulosin (FLOMAX) 0.4 MG CAPS capsule Take 1 capsule (0.4 mg total) by mouth daily.   torsemide (DEMADEX) 20 MG tablet TAKE 3 TABLETS BY MOUTH EVERY DAY   [DISCONTINUED] amoxicillin-clavulanate (AUGMENTIN) 500-125 MG tablet Take 1 tablet by mouth in the morning and at bedtime.   [DISCONTINUED] atorvastatin (LIPITOR) 80 MG tablet Take 1 tablet (80 mg total) by mouth daily.   cetirizine (ZYRTEC) 10 MG tablet Take 10 mg by mouth daily. (Patient not taking: Reported on 12/05/2022)   moxifloxacin (VIGAMOX) 0.5 % ophthalmic solution INSTILL 1 DROP INTO RIGHT EYE 4 TIMES A DAY AS DIRECTED (Patient not taking: Reported on 11/30/2022)   pantoprazole (PROTONIX) 40 MG tablet TAKE 1 TABLET BY  MOUTH TWICE A DAY    No facility-administered medications prior to visit.    Review of Systems  Constitutional:  Positive for malaise/fatigue. Negative for chills, fever and weight loss.  HENT:  Positive for hearing loss. Negative for congestion, ear discharge, nosebleeds and sinus pain.   Eyes: Negative.   Respiratory: Negative.  Negative for cough, shortness of breath and wheezing.   Cardiovascular: Negative.  Negative for chest pain, palpitations and leg swelling.  Gastrointestinal: Negative.  Negative for abdominal pain, constipation, diarrhea, heartburn, nausea and vomiting.  Genitourinary: Negative.  Negative for dysuria and flank pain.  Musculoskeletal:  Positive for joint pain and myalgias.  Skin: Negative.   Neurological: Negative.  Negative for dizziness, tingling, tremors, sensory change, speech change and headaches.  Endo/Heme/Allergies: Negative.   Psychiatric/Behavioral: Negative.  Negative for depression and suicidal ideas. The patient is not nervous/anxious.        Objective:   BP 120/70   Pulse 84   Wt 161 lb 9.6 oz (73.3 kg)   SpO2 90%   BMI 20.75 kg/m   Vitals:   12/05/22 1100  BP: 120/70  Pulse: 84  Weight: 161 lb 9.6 oz (73.3 kg)  SpO2: 90%  BMI (Calculated): 20.74    Physical Exam Vitals and nursing note reviewed.  Constitutional:      General: He is not in acute distress.    Appearance: Normal appearance.  HENT:     Head: Normocephalic and atraumatic.     Nose: Nose normal.     Mouth/Throat:     Mouth: Mucous membranes are moist.     Pharynx: Oropharynx is clear.  Eyes:     Conjunctiva/sclera: Conjunctivae normal.     Pupils: Pupils are equal, round, and reactive to light.  Cardiovascular:     Rate and Rhythm: Normal rate and regular rhythm.     Pulses: Normal pulses.     Heart sounds: Normal heart sounds.  Pulmonary:     Effort: Pulmonary effort is normal.     Breath sounds: Normal breath sounds. No wheezing, rhonchi or rales.  Abdominal:      General: Bowel sounds are normal.     Palpations: Abdomen is soft. There is no mass.     Tenderness: There is no abdominal tenderness. There is no right CVA tenderness, left CVA tenderness, guarding or rebound.     Hernia: No hernia is present.  Musculoskeletal:        General: Normal range of motion.     Cervical back: Normal range of motion.     Right lower leg: No edema.     Left lower leg: No edema.  Skin:    General: Skin is warm and dry.     Findings: No rash.  Neurological:     General: No focal deficit present.     Mental Status: He is alert and oriented to person, place, and time.  Psychiatric:        Mood and Affect: Mood normal.        Behavior: Behavior normal.        Judgment: Judgment normal.      No results found for any visits on 12/05/22.  Recent Results (from the past 2160 hour(s))  CBC with Diff     Status: None   Collection Time: 11/21/22 10:30 AM  Result Value Ref Range   WBC 7.3 3.4 - 10.8 x10E3/uL   RBC 4.46 4.14 - 5.80 x10E6/uL   Hemoglobin 13.4 13.0 - 17.7 g/dL  Hematocrit 41.8 37.5 - 51.0 %   MCV 94 79 - 97 fL   MCH 30.0 26.6 - 33.0 pg   MCHC 32.1 31.5 - 35.7 g/dL   RDW 16.1 09.6 - 04.5 %   Platelets 195 150 - 450 x10E3/uL   Neutrophils 73 Not Estab. %   Lymphs 17 Not Estab. %   Monocytes 8 Not Estab. %   Eos 1 Not Estab. %   Basos 1 Not Estab. %   Neutrophils Absolute 5.3 1.4 - 7.0 x10E3/uL   Lymphocytes Absolute 1.2 0.7 - 3.1 x10E3/uL   Monocytes Absolute 0.6 0.1 - 0.9 x10E3/uL   EOS (ABSOLUTE) 0.1 0.0 - 0.4 x10E3/uL   Basophils Absolute 0.0 0.0 - 0.2 x10E3/uL   Immature Granulocytes 0 Not Estab. %   Immature Grans (Abs) 0.0 0.0 - 0.1 x10E3/uL  Lipid Panel w/o Chol/HDL Ratio     Status: Abnormal   Collection Time: 11/21/22 10:30 AM  Result Value Ref Range   Cholesterol, Total 283 (H) 100 - 199 mg/dL   Triglycerides 98 0 - 149 mg/dL   HDL 45 >40 mg/dL   VLDL Cholesterol Cal 17 5 - 40 mg/dL   LDL Chol Calc (NIH) 981 (H) 0 - 99 mg/dL    LDL CALC COMMENT: Comment     Comment: Consider evaluating for Familial Hypercholesterolemia(FH), if clinically indicated.   CMP14+EGFR     Status: Abnormal   Collection Time: 11/21/22 10:30 AM  Result Value Ref Range   Glucose 90 70 - 99 mg/dL   BUN 24 8 - 27 mg/dL   Creatinine, Ser 1.91 (H) 0.76 - 1.27 mg/dL   eGFR 54 (L) >47 WG/NFA/2.13   BUN/Creatinine Ratio 18 10 - 24   Sodium 142 134 - 144 mmol/L   Potassium 4.6 3.5 - 5.2 mmol/L   Chloride 99 96 - 106 mmol/L   CO2 29 20 - 29 mmol/L   Calcium 10.0 8.6 - 10.2 mg/dL   Total Protein 7.5 6.0 - 8.5 g/dL   Albumin 4.4 3.7 - 4.7 g/dL   Globulin, Total 3.1 1.5 - 4.5 g/dL   Bilirubin Total 0.7 0.0 - 1.2 mg/dL   Alkaline Phosphatase 180 (H) 44 - 121 IU/L   AST 21 0 - 40 IU/L   ALT 12 0 - 44 IU/L  Vitamin D (25 hydroxy)     Status: None   Collection Time: 11/21/22 10:30 AM  Result Value Ref Range   Vit D, 25-Hydroxy 35.9 30.0 - 100.0 ng/mL    Comment: Vitamin D deficiency has been defined by the Institute of Medicine and an Endocrine Society practice guideline as a level of serum 25-OH vitamin D less than 20 ng/mL (1,2). The Endocrine Society went on to further define vitamin D insufficiency as a level between 21 and 29 ng/mL (2). 1. IOM (Institute of Medicine). 2010. Dietary reference    intakes for calcium and D. [place]: The    [employer]. 2. Holick MF, [place], Bischoff-Ferrari HA, et al.    Evaluation, treatment, and prevention of vitamin D    deficiency: an Endocrine Society clinical practice    guideline. JCEM. 2011 Jul; 96(7):1911-30.   POCT Urinalysis Dipstick (08657)     Status: Abnormal   Collection Time: 11/21/22 11:40 AM  Result Value Ref Range   Color, UA yellow    Clarity, UA slightly cloudy    Glucose, UA Negative Negative   Bilirubin, UA negative    Ketones, UA negative  Spec Grav, UA 1.010 1.010 - 1.025   Blood, UA negative    pH, UA 5.0 5.0 - 8.0   Protein, UA Negative  Negative   Urobilinogen, UA 0.2 0.2 or 1.0 E.U./dL   Nitrite, UA negative    Leukocytes, UA Large (3+) (A) Negative   Appearance     Odor    POC CREATINE & ALBUMIN,URINE     Status: Abnormal   Collection Time: 11/21/22 11:43 AM  Result Value Ref Range   Microalbumin Ur, POC 10 mg/L   Creatinine, POC 50 mg/dL   Albumin/Creatinine Ratio, Urine, POC >300   Urinalysis     Status: Abnormal   Collection Time: 11/21/22  3:38 PM  Result Value Ref Range   Specific Gravity, UA 1.011 1.005 - 1.030   pH, UA 6.0 5.0 - 7.5   Color, UA Yellow Yellow   Appearance Ur Clear Clear   Leukocytes,UA 3+ (A) Negative   Protein,UA Negative Negative/Trace   Glucose, UA Negative Negative   Ketones, UA Negative Negative   RBC, UA Negative Negative   Bilirubin, UA Negative Negative   Urobilinogen, Ur 0.2 0.2 - 1.0 mg/dL   Nitrite, UA Negative Negative  Urine Culture     Status: Abnormal   Collection Time: 11/21/22  3:40 PM   Specimen: Urine   UC  Result Value Ref Range   Urine Culture, Routine Final report (A)    Organism ID, Bacteria Escherichia coli (A)     Comment: Multi-Drug Resistant Organism Susceptibility profile is consistent with a probable ESBL. Greater than 100,000 colony forming units per mL    Antimicrobial Susceptibility Comment     Comment:       ** S = Susceptible; I = Intermediate; R = Resistant **                    P = Positive; N = Negative             MICS are expressed in micrograms per mL    Antibiotic                 RSLT#1    RSLT#2    RSLT#3    RSLT#4 Amoxicillin/Clavulanic Acid    S Ampicillin                     R Cefazolin                      R Cefepime                       R Ceftriaxone                    R Cefuroxime                     R Ciprofloxacin                  R Ertapenem                      S Gentamicin                     S Imipenem                       S Levofloxacin  R Meropenem                      S Nitrofurantoin                  S Piperacillin/Tazobactam        S Tetracycline                   R Tobramycin                     S Trimethoprim/Sulfa             R       Assessment & Plan:  Patient to resume her Lipitor. Start Cymbalta. Follow-up with the urologist. Problem List Items Addressed This Visit     Essential hypertension, benign - Primary   Relevant Medications   atorvastatin (LIPITOR) 80 MG tablet   Hyperlipidemia   Relevant Medications   atorvastatin (LIPITOR) 80 MG tablet   Elevated alkaline phosphatase level   Acute on chronic diastolic CHF (congestive heart failure) (HCC)   Relevant Medications   atorvastatin (LIPITOR) 80 MG tablet   Coronary artery disease   Relevant Medications   atorvastatin (LIPITOR) 80 MG tablet   BPH (benign prostatic hyperplasia)   GAD (generalized anxiety disorder)   Relevant Medications   DULoxetine (CYMBALTA) 30 MG capsule    Follow up 10 days.   Total time spent: 30 minutes  Margaretann Loveless, MD  12/05/2022   This document may have been prepared by [facility] Voice Recognition software and as such may include unintentional dictation errors.

## 2022-12-13 NOTE — Telephone Encounter (Signed)
Received [facility] referral form. Faxed to [employer] with clinicals and insurance card copy  Phone: [phone] Fax: [phone]  Chesley Mires, PharmD, MPH, BCPS, CPP Clinical Pharmacist (Rheumatology and Pulmonology)

## 2022-12-15 NOTE — Telephone Encounter (Signed)
Received fax from [facility] [employer] that patient has been enrolled into pathway plus program. Sharyn Blitz rx has been triaged to [facility]  Patient ID: [id number] Phone: [phone] Fax: [phone]  Chesley Mires, PharmD, MPH, BCPS, CPP Clinical Pharmacist (Rheumatology and Pulmonology)

## 2022-12-21 DIAGNOSIS — H25813 Combined forms of age-related cataract, bilateral: Secondary | ICD-10-CM | POA: Diagnosis not present

## 2022-12-21 DIAGNOSIS — H2512 Age-related nuclear cataract, left eye: Secondary | ICD-10-CM | POA: Diagnosis not present

## 2022-12-23 ENCOUNTER — Ambulatory Visit: Payer: Medicare HMO | Admitting: Family

## 2022-12-27 ENCOUNTER — Other Ambulatory Visit: Payer: Self-pay | Admitting: Internal Medicine

## 2022-12-27 ENCOUNTER — Ambulatory Visit: Payer: Medicare HMO | Admitting: Internal Medicine

## 2022-12-27 DIAGNOSIS — F411 Generalized anxiety disorder: Secondary | ICD-10-CM

## 2022-12-27 DIAGNOSIS — J9611 Chronic respiratory failure with hypoxia: Secondary | ICD-10-CM | POA: Diagnosis not present

## 2022-12-28 ENCOUNTER — Telehealth: Payer: Self-pay

## 2022-12-28 DIAGNOSIS — G4734 Idiopathic sleep related nonobstructive alveolar hypoventilation: Secondary | ICD-10-CM

## 2022-12-28 NOTE — Telephone Encounter (Signed)
ONO reviewed by Dr. Jayme Cloud( Dr. Belia Heman is out of the office) - Low SpO2 77%. Patient continues to require O2 nocturnally. Recommend 2Lpm via [place].   I notified the patient. He said he no longer has an oxygen concentrator. He said they came and picked it up.  I have placed an order for O2.  Nothing further needed.

## 2023-01-05 ENCOUNTER — Other Ambulatory Visit: Payer: Self-pay | Admitting: Cardiovascular Disease

## 2023-01-05 DIAGNOSIS — I2510 Atherosclerotic heart disease of native coronary artery without angina pectoris: Secondary | ICD-10-CM

## 2023-01-05 DIAGNOSIS — R079 Chest pain, unspecified: Secondary | ICD-10-CM

## 2023-01-09 ENCOUNTER — Encounter: Payer: Self-pay | Admitting: Internal Medicine

## 2023-01-09 ENCOUNTER — Ambulatory Visit (INDEPENDENT_AMBULATORY_CARE_PROVIDER_SITE_OTHER): Payer: Medicare HMO | Admitting: Internal Medicine

## 2023-01-09 VITALS — BP 124/56 | HR 56 | Ht 74.0 in | Wt 163.8 lb

## 2023-01-09 DIAGNOSIS — N401 Enlarged prostate with lower urinary tract symptoms: Secondary | ICD-10-CM | POA: Diagnosis not present

## 2023-01-09 DIAGNOSIS — Z1389 Encounter for screening for other disorder: Secondary | ICD-10-CM

## 2023-01-09 DIAGNOSIS — E782 Mixed hyperlipidemia: Secondary | ICD-10-CM | POA: Diagnosis not present

## 2023-01-09 DIAGNOSIS — N390 Urinary tract infection, site not specified: Secondary | ICD-10-CM | POA: Diagnosis not present

## 2023-01-09 DIAGNOSIS — I10 Essential (primary) hypertension: Secondary | ICD-10-CM

## 2023-01-09 DIAGNOSIS — F411 Generalized anxiety disorder: Secondary | ICD-10-CM

## 2023-01-09 DIAGNOSIS — I25110 Atherosclerotic heart disease of native coronary artery with unstable angina pectoris: Secondary | ICD-10-CM | POA: Diagnosis not present

## 2023-01-09 LAB — POCT URINALYSIS DIPSTICK
Bilirubin, UA: NEGATIVE
Glucose, UA: NEGATIVE
Ketones, UA: NEGATIVE
Nitrite, UA: NEGATIVE
Protein, UA: NEGATIVE
Spec Grav, UA: 1.01 (ref 1.010–1.025)
Urobilinogen, UA: 0.2 U/dL
pH, UA: 6 (ref 5.0–8.0)

## 2023-01-09 MED ORDER — NITROFURANTOIN MONOHYD MACRO 100 MG PO CAPS: 100.0000 mg | ORAL_CAPSULE | Freq: Two times a day (BID) | ORAL | 0 refills | Status: AC

## 2023-01-09 NOTE — Progress Notes (Signed)
Established Patient Office Visit  Subjective:  Patient ID: [name] Browning, male    DOB: [birth year]  Age: 82 y.o. MRN: [id number]  Chief Complaint  Patient presents with   Follow-up    10 day follow up    Patient comes in for his follow-up today.  He is using Breztri inhaler and is doing much better.  His breathing and shortness of breath has improved.  No chest pain and no palpitations. However he continues to have dysuria and increased frequency.  He is under the care of a urologist. He was recently diagnosed with urinary tract infection but was not able to complete the antibiotic course.  His urine dipstick today still shows a large amount of pus cells.  Will send it for culture and start a prescription for Macrobid. He will also start his duloxetine from today.    No other concerns at this time.   Past Medical History:  Diagnosis Date   Anginal pain (HCC)    Anxiety    Aortic atherosclerosis (HCC)    Atrial fibrillation (HCC) 01/2019   Bilateral carpal tunnel syndrome    CAD (coronary artery disease)    a.) LHC 2004 --> normal coronaries. b.) normal stress test in 2007 and 2011; c.) Lexiscan 05/20/2014 --> LVEF 55-65%; no significant stress induced ischemia/arrythmia. d.) CT chest 03/25/2019 --> coronaries carcified.   Carotid atherosclerosis, bilateral    Carpal tunnel syndrome, bilateral    CHF (congestive heart failure) (HCC)    Chronic anticoagulation    a.) ASA + apixaban   COPD (chronic obstructive pulmonary disease) (HCC)    CVA (cerebral vascular accident) (HCC)    Degenerative disc disease, cervical    Diastolic dysfunction    a.) TTE 05/29/2014 --> LVEF 60-65%; G1DD.   DVT (deep venous thrombosis) (HCC)    GERD (gastroesophageal reflux disease)    History of 2019 novel coronavirus disease (COVID-19) 02/08/2019   HLD (hyperlipidemia)    Hypertension    Kidney stones    Osteoarthritis of right shoulder    Pneumonia    Respiratory failure, acute (HCC)  09/20/2020   a.) severe respiratory distress 1 hour after urological surgery. CXR (+) for acute pulmonary edema. Transferred to ICU and placed on NIPPV. Questionable aspiration PNA. (+) A.fib with RVR. Improved with ABX, diuresis, and amiodarone.   Skin cancer of face    a.) RIGHT ear and RIGHT forehead; excised.   Syncope    TIA (transient ischemic attack) 2016   Valvular regurgitation    a.) TTE 05/26/2014 --> LVEF 60-65%; trivial MR, mild TR; no AR or PR. b.) TTE 04/20/2015 --> LVEF 55-60%; trivial MR and PR; no AR or TR.    Past Surgical History:  Procedure Laterality Date   CARDIAC CATHETERIZATION  2004   CARPAL TUNNEL RELEASE Right 06/11/2013   CENTRAL LINE INSERTION  11/14/2020   Procedure: CENTRAL LINE INSERTION;  Surgeon: Marykay Lex, MD;  Location: [facility] INVASIVE CV LAB;  Service: Cardiovascular;;   CORONARY/GRAFT ACUTE MI REVASCULARIZATION N/A 11/14/2020   Procedure: Coronary/Graft Acute MI Revascularization;  Surgeon: Marykay Lex, MD;  Location: [facility] INVASIVE CV LAB;  Service: Cardiovascular;  Laterality: N/A;   CYSTOSCOPY W/ RETROGRADES  10/16/2020   Procedure: CYSTOSCOPY WITH RETROGRADE PYELOGRAM;  Surgeon: Sondra Come, MD;  Location: [facility] ORS;  Service: Urology;;   CYSTOSCOPY W/ URETERAL STENT PLACEMENT Left 09/20/2020   Procedure: CYSTOSCOPY WITH RETROGRADE PYELOGRAM/URETERAL STENT PLACEMENT;  Surgeon: Jannifer Hick, MD;  Location: [facility] ORS;  Service: Urology;  Laterality: Left;   CYSTOSCOPY/URETEROSCOPY/HOLMIUM LASER/STENT PLACEMENT Left 10/16/2020   Procedure: CYSTOSCOPY/URETEROSCOPY/HOLMIUM LASER/STENT PLACEMENT;  Surgeon: Sondra Come, MD;  Location: [facility] ORS;  Service: Urology;  Laterality: Left;   ESOPHAGOGASTRODUODENOSCOPY N/A 11/06/2020   Procedure: ESOPHAGOGASTRODUODENOSCOPY (EGD);  Surgeon: Midge Minium, MD;  Location: [facility] ENDOSCOPY;  Service: Endoscopy;  Laterality: N/A;   LEFT HEART CATH AND CORONARY ANGIOGRAPHY N/A 11/14/2020   Procedure: LEFT  HEART CATH AND CORONARY ANGIOGRAPHY;  Surgeon: Marykay Lex, MD;  Location: [facility] INVASIVE CV LAB;  Service: Cardiovascular;  Laterality: N/A;   LEFT HEART CATH AND CORONARY ANGIOGRAPHY Left 01/28/2022   Procedure: LEFT HEART CATH AND CORONARY ANGIOGRAPHY;  Surgeon: Laurier Nancy, MD;  Location: [facility] INVASIVE CV LAB;  Service: Cardiovascular;  Laterality: Left;   SHOULDER ARTHROSCOPY WITH OPEN ROTATOR CUFF REPAIR Right 08/24/2017   Procedure: SHOULDER ARTHROSCOPY WITH OPEN ROTATOR CUFF REPAIR;  Surgeon: Christena Flake, MD;  Location: [facility] ORS;  Service: Orthopedics;  Laterality: Right;   SKIN CANCER EXCISION  12/01/2016   right ear    SKIN CANCER EXCISION     remove from the right side of the face    THROMBECTOMY Right 2004   leg   THYROIDECTOMY  1950   Not sure if total or partial thyroidectomy.    Social History   Socioeconomic History   Marital status: Legally Separated    Spouse name: Not on file   Number of children: 1   Years of education: Not on file   Highest education level: Not on file  Occupational History   Not on file  Tobacco Use   Smoking status: Former    Current packs/day: 0.00    Average packs/day: 1 pack/day for 46.1 years (46.1 ttl pk-yrs)    Types: Cigarettes    Start date: 01/10/1957    Quit date: 02/11/2003    Years since quitting: 19.9    Passive exposure: Past   Smokeless tobacco: Former    Types: Snuff    Quit date: 02/11/2003  Vaping Use   Vaping status: Never Used  Substance and Sexual Activity   Alcohol use: Not Currently    Alcohol/week: 7.0 standard drinks of alcohol    Types: 7 Cans of beer per week   Drug use: No   Sexual activity: Yes    Partners: Female  Other Topics Concern   Not on file  Social History Narrative   Live with grandaughter, April. 1 son that lives in [place].   Social Drivers of Corporate investment banker Strain: Not on file  Food Insecurity: No Food Insecurity (05/20/2022)   Hunger Vital Sign    Worried About  Running Out of Food in the Last Year: Never true    Ran Out of Food in the Last Year: Never true  Transportation Needs: No Transportation Needs (05/20/2022)   PRAPARE - Administrator, Civil Service (Medical): No    Lack of Transportation (Non-Medical): No  Physical Activity: Not on file  Stress: Not on file  Social Connections: Not on file  Intimate Partner Violence: Not At Risk (03/05/2022)   Humiliation, Afraid, Rape, and Kick questionnaire    Fear of Current or Ex-Partner: No    Emotionally Abused: No    Physically Abused: No    Sexually Abused: No    Family History  Problem Relation Age of Onset   Hypertension Mother    Heart disease Mother    CAD Father  Heart attack Father     Allergies  Allergen Reactions   Hydromorphone     Hallucination Pt reports he doesn't know about this    Outpatient Medications Prior to Visit  Medication Sig   acetaminophen (TYLENOL) 500 MG tablet Take 2 tablets (1,000 mg total) by mouth every 6 (six) hours as needed for mild pain.   aspirin EC 81 MG tablet Take 81 mg by mouth daily after supper.   atorvastatin (LIPITOR) 80 MG tablet Take 1 tablet (80 mg total) by mouth daily.   Budeson-Glycopyrrol-Formoterol (BREZTRI AEROSPHERE) 160-9-4.8 MCG/ACT AERO Inhale 2 puffs into the lungs in the morning and at bedtime.   cetirizine (ZYRTEC) 10 MG tablet Take 10 mg by mouth daily.   clopidogrel (PLAVIX) 75 MG tablet TAKE 1 TABLET BY MOUTH EVERY DAY   DULoxetine (CYMBALTA) 30 MG capsule TAKE 1 CAPSULE BY MOUTH EVERY DAY   Ensifentrine (OHTUVAYRE) 3 MG/2.5ML SUSP Inhale 2.5 mLs into the lungs 2 (two) times daily.   famotidine (PEPCID) 20 MG tablet TAKE 1 TABLET BY MOUTH AFTER SUPPER   ferrous gluconate (FERGON) 240 (27 FE) MG tablet Take 240 mg by mouth 3 (three) times daily with meals.   finasteride (PROSCAR) 5 MG tablet Take 1 tablet (5 mg total) by mouth daily.   hyoscyamine (ANASPAZ) 0.125 MG TBDP disintergrating tablet Place 1 tablet  (0.125 mg total) under the tongue every 6 (six) hours as needed.   isosorbide mononitrate (IMDUR) 30 MG 24 hr tablet Take 1 tablet (30 mg total) by mouth 2 (two) times daily.   ketorolac (ACULAR) 0.5 % ophthalmic solution Place 1 drop into the left eye 4 (four) times daily.   KLOR-CON M20 20 MEQ tablet TAKE 1 TABLET BY MOUTH EVERY DAY   losartan (COZAAR) 50 MG tablet TAKE 1 TABLET BY MOUTH EVERY DAY   Meth-Hyo-M Bl-Na Phos-Ph Sal (URIBEL) 118 MG CAPS Take 1 capsule (118 mg total) by mouth every 6 (six) hours as needed.   methenamine (HIPREX) 1 g tablet Take 1 tablet (1 g total) by mouth 2 (two) times daily with a meal.   metoprolol succinate (TOPROL XL) 25 MG 24 hr tablet Take 1 tablet (25 mg total) by mouth daily.   montelukast (SINGULAIR) 10 MG tablet Take 10 mg by mouth at bedtime.   moxifloxacin (VIGAMOX) 0.5 % ophthalmic solution    Nebulizers (VIOS LC PLUS) MISC    ondansetron (ZOFRAN-ODT) 4 MG disintegrating tablet Take 1 tablet (4 mg total) by mouth every 6 (six) hours as needed for nausea or vomiting.   pantoprazole (PROTONIX) 40 MG tablet TAKE 1 TABLET BY MOUTH TWICE A DAY   prednisoLONE acetate (PRED FORTE) 1 % ophthalmic suspension INSTILL 1 DROP INTO RIGHT EYE FOUR TIMES A DAY AS DIRECTED PLEASE SHAKE WELL BEFORE EACH USE!!   ranolazine (RANEXA) 1000 MG SR tablet TAKE 1 TABLET BY MOUTH TWICE A DAY   roflumilast (DALIRESP) 500 MCG TABS tablet Take 500 mcg by mouth daily.   sulfacetamide (BLEPH-10) 10 % ophthalmic solution SMARTSIG:In Eye(s)   tamsulosin (FLOMAX) 0.4 MG CAPS capsule Take 1 capsule (0.4 mg total) by mouth daily.   torsemide (DEMADEX) 20 MG tablet TAKE 3 TABLETS BY MOUTH EVERY DAY   No facility-administered medications prior to visit.    Review of Systems  Constitutional: Negative.  Negative for chills, diaphoresis, fever, malaise/fatigue and weight loss.  HENT:  Positive for hearing loss. Negative for sore throat.   Eyes: Negative.   Respiratory: Negative.  Negative for cough, sputum production, shortness of breath, wheezing and stridor.   Cardiovascular: Negative.  Negative for chest pain, palpitations and leg swelling.  Gastrointestinal: Negative.  Negative for abdominal pain, constipation, diarrhea, heartburn, nausea and vomiting.  Genitourinary:  Positive for dysuria and frequency. Negative for flank pain.  Musculoskeletal: Negative.  Negative for joint pain and myalgias.  Skin: Negative.   Neurological: Negative.  Negative for dizziness and headaches.  Endo/Heme/Allergies: Negative.   Psychiatric/Behavioral: Negative.  Negative for depression and suicidal ideas. The patient is not nervous/anxious.        Objective:   BP (!) 124/56   Pulse (!) 56   Ht 6\' 2"  (1.88 m)   Wt 163 lb 12.8 oz (74.3 kg)   SpO2 92%   BMI 21.03 kg/m   Vitals:   01/09/23 1103  BP: (!) 124/56  Pulse: (!) 56  Height: 6\' 2"  (1.88 m)  Weight: 163 lb 12.8 oz (74.3 kg)  SpO2: 92%  BMI (Calculated): 21.02    Physical Exam Vitals and nursing note reviewed.  Constitutional:      General: He is not in acute distress.    Appearance: Normal appearance.  HENT:     Head: Normocephalic and atraumatic.     Nose: Nose normal.     Mouth/Throat:     Mouth: Mucous membranes are moist.     Pharynx: Oropharynx is clear.  Eyes:     Conjunctiva/sclera: Conjunctivae normal.     Pupils: Pupils are equal, round, and reactive to light.  Cardiovascular:     Rate and Rhythm: Normal rate and regular rhythm.     Pulses: Normal pulses.     Heart sounds: Normal heart sounds.  Pulmonary:     Effort: Pulmonary effort is normal.     Breath sounds: Normal breath sounds. No wheezing, rhonchi or rales.  Abdominal:     General: Bowel sounds are normal.     Palpations: Abdomen is soft. There is no mass.     Tenderness: There is no abdominal tenderness. There is no right CVA tenderness, left CVA tenderness, guarding or rebound.     Hernia: No hernia is present.   Musculoskeletal:        General: Normal range of motion.     Cervical back: Normal range of motion.     Right lower leg: No edema.     Left lower leg: No edema.  Skin:    General: Skin is warm and dry.     Findings: No rash.  Neurological:     General: No focal deficit present.     Mental Status: He is alert and oriented to person, place, and time.  Psychiatric:        Mood and Affect: Mood normal.        Behavior: Behavior normal.        Judgment: Judgment normal.      Results for orders placed or performed in visit on 01/09/23  POCT Urinalysis Dipstick (81002)  Result Value Ref Range   Color, UA yellow    Clarity, UA cloudy    Glucose, UA Negative Negative   Bilirubin, UA neg    Ketones, UA neg    Spec Grav, UA 1.010 1.010 - 1.025   Blood, UA trace    pH, UA 6.0 5.0 - 8.0   Protein, UA Negative Negative   Urobilinogen, UA 0.2 0.2 or 1.0 E.U./dL   Nitrite, UA neg    Leukocytes, UA Large (3+) (A) Negative  Appearance cloudy    Odor yes     Recent Results (from the past 2160 hours)  CBC with Diff     Status: None   Collection Time: 11/21/22 10:30 AM  Result Value Ref Range   WBC 7.3 3.4 - 10.8 x10E3/uL   RBC 4.46 4.14 - 5.80 x10E6/uL   Hemoglobin 13.4 13.0 - 17.7 g/dL   Hematocrit 19.1 47.8 - 51.0 %   MCV 94 79 - 97 fL   MCH 30.0 26.6 - 33.0 pg   MCHC 32.1 31.5 - 35.7 g/dL   RDW 29.5 62.1 - 30.8 %   Platelets 195 150 - 450 x10E3/uL   Neutrophils 73 Not Estab. %   Lymphs 17 Not Estab. %   Monocytes 8 Not Estab. %   Eos 1 Not Estab. %   Basos 1 Not Estab. %   Neutrophils Absolute 5.3 1.4 - 7.0 x10E3/uL   Lymphocytes Absolute 1.2 0.7 - 3.1 x10E3/uL   Monocytes Absolute 0.6 0.1 - 0.9 x10E3/uL   EOS (ABSOLUTE) 0.1 0.0 - 0.4 x10E3/uL   Basophils Absolute 0.0 0.0 - 0.2 x10E3/uL   Immature Granulocytes 0 Not Estab. %   Immature Grans (Abs) 0.0 0.0 - 0.1 x10E3/uL  Lipid Panel w/o Chol/HDL Ratio     Status: Abnormal   Collection Time: 11/21/22 10:30 AM  Result  Value Ref Range   Cholesterol, Total 283 (H) 100 - 199 mg/dL   Triglycerides 98 0 - 149 mg/dL   HDL 45 >65 mg/dL   VLDL Cholesterol Cal 17 5 - 40 mg/dL   LDL Chol Calc (NIH) 784 (H) 0 - 99 mg/dL   LDL CALC COMMENT: Comment     Comment: Consider evaluating for Familial Hypercholesterolemia(FH), if clinically indicated.   CMP14+EGFR     Status: Abnormal   Collection Time: 11/21/22 10:30 AM  Result Value Ref Range   Glucose 90 70 - 99 mg/dL   BUN 24 8 - 27 mg/dL   Creatinine, Ser 6.96 (H) 0.76 - 1.27 mg/dL   eGFR 54 (L) >29 BM/WUX/3.24   BUN/Creatinine Ratio 18 10 - 24   Sodium 142 134 - 144 mmol/L   Potassium 4.6 3.5 - 5.2 mmol/L   Chloride 99 96 - 106 mmol/L   CO2 29 20 - 29 mmol/L   Calcium 10.0 8.6 - 10.2 mg/dL   Total Protein 7.5 6.0 - 8.5 g/dL   Albumin 4.4 3.7 - 4.7 g/dL   Globulin, Total 3.1 1.5 - 4.5 g/dL   Bilirubin Total 0.7 0.0 - 1.2 mg/dL   Alkaline Phosphatase 180 (H) 44 - 121 IU/L   AST 21 0 - 40 IU/L   ALT 12 0 - 44 IU/L  Vitamin D (25 hydroxy)     Status: None   Collection Time: 11/21/22 10:30 AM  Result Value Ref Range   Vit D, 25-Hydroxy 35.9 30.0 - 100.0 ng/mL    Comment: Vitamin D deficiency has been defined by the Institute of Medicine and an Endocrine Society practice guideline as a level of serum 25-OH vitamin D less than 20 ng/mL (1,2). The Endocrine Society went on to further define vitamin D insufficiency as a level between 21 and 29 ng/mL (2). 1. IOM (Institute of Medicine). 2010. Dietary reference    intakes for calcium and D. [place]: The    [employer]. 2. Holick MF, Binkley [place], Bischoff-Ferrari HA, et al.    Evaluation, treatment, and prevention of vitamin D    deficiency: an Endocrine  Society clinical practice    guideline. JCEM. 2011 Jul; 96(7):1911-30.   POCT Urinalysis Dipstick (30865)     Status: Abnormal   Collection Time: 11/21/22 11:40 AM  Result Value Ref Range   Color, UA yellow    Clarity, UA slightly cloudy     Glucose, UA Negative Negative   Bilirubin, UA negative    Ketones, UA negative    Spec Grav, UA 1.010 1.010 - 1.025   Blood, UA negative    pH, UA 5.0 5.0 - 8.0   Protein, UA Negative Negative   Urobilinogen, UA 0.2 0.2 or 1.0 E.U./dL   Nitrite, UA negative    Leukocytes, UA Large (3+) (A) Negative   Appearance     Odor    POC CREATINE & ALBUMIN,URINE     Status: Abnormal   Collection Time: 11/21/22 11:43 AM  Result Value Ref Range   Microalbumin Ur, POC 10 mg/L   Creatinine, POC 50 mg/dL   Albumin/Creatinine Ratio, Urine, POC >300   Urinalysis     Status: Abnormal   Collection Time: 11/21/22  3:38 PM  Result Value Ref Range   Specific Gravity, UA 1.011 1.005 - 1.030   pH, UA 6.0 5.0 - 7.5   Color, UA Yellow Yellow   Appearance Ur Clear Clear   Leukocytes,UA 3+ (A) Negative   Protein,UA Negative Negative/Trace   Glucose, UA Negative Negative   Ketones, UA Negative Negative   RBC, UA Negative Negative   Bilirubin, UA Negative Negative   Urobilinogen, Ur 0.2 0.2 - 1.0 mg/dL   Nitrite, UA Negative Negative  Urine Culture     Status: Abnormal   Collection Time: 11/21/22  3:40 PM   Specimen: Urine   UC  Result Value Ref Range   Urine Culture, Routine Final report (A)    Organism ID, Bacteria Escherichia coli (A)     Comment: Multi-Drug Resistant Organism Susceptibility profile is consistent with a probable ESBL. Greater than 100,000 colony forming units per mL    Antimicrobial Susceptibility Comment     Comment:       ** S = Susceptible; I = Intermediate; R = Resistant **                    P = Positive; N = Negative             MICS are expressed in micrograms per mL    Antibiotic                 RSLT#1    RSLT#2    RSLT#3    RSLT#4 Amoxicillin/Clavulanic Acid    S Ampicillin                     R Cefazolin                      R Cefepime                       R Ceftriaxone                    R Cefuroxime                     R Ciprofloxacin                   R Ertapenem  S Gentamicin                     S Imipenem                       S Levofloxacin                   R Meropenem                      S Nitrofurantoin                 S Piperacillin/Tazobactam        S Tetracycline                   R Tobramycin                     S Trimethoprim/Sulfa             R   POCT Urinalysis Dipstick (21308)     Status: Abnormal   Collection Time: 01/09/23 11:27 AM  Result Value Ref Range   Color, UA yellow    Clarity, UA cloudy    Glucose, UA Negative Negative   Bilirubin, UA neg    Ketones, UA neg    Spec Grav, UA 1.010 1.010 - 1.025   Blood, UA trace    pH, UA 6.0 5.0 - 8.0   Protein, UA Negative Negative   Urobilinogen, UA 0.2 0.2 or 1.0 E.U./dL   Nitrite, UA neg    Leukocytes, UA Large (3+) (A) Negative   Appearance cloudy    Odor yes       Assessment & Plan:  Start Macrobid for recurrent UTI.  Will repeat urine at follow-up.  Continue other medications. Problem List Items Addressed This Visit     Essential hypertension, benign - Primary   Hyperlipidemia   Coronary artery disease   BPH (benign prostatic hyperplasia)   GAD (generalized anxiety disorder)   Other Visit Diagnoses       Screening for blood or protein in urine       Relevant Orders   POCT Urinalysis Dipstick ([id number]) (Completed)     Recurrent UTI       Relevant Medications   nitrofurantoin, macrocrystal-monohydrate, (MACROBID) 100 MG capsule   Other Relevant Orders   Urine Culture       Return in about 2 weeks (around 01/23/2023).   Total time spent: 30 minutes  Margaretann Loveless, MD  01/09/2023   This document may have been prepared by [facility] Voice Recognition software and as such may include unintentional dictation errors.

## 2023-01-12 LAB — URINE CULTURE

## 2023-01-23 ENCOUNTER — Ambulatory Visit (INDEPENDENT_AMBULATORY_CARE_PROVIDER_SITE_OTHER): Payer: Medicare HMO | Admitting: Internal Medicine

## 2023-01-23 ENCOUNTER — Encounter: Payer: Self-pay | Admitting: Internal Medicine

## 2023-01-23 VITALS — BP 102/60 | HR 59 | Ht 74.0 in | Wt 168.2 lb

## 2023-01-23 DIAGNOSIS — F411 Generalized anxiety disorder: Secondary | ICD-10-CM

## 2023-01-23 DIAGNOSIS — N401 Enlarged prostate with lower urinary tract symptoms: Secondary | ICD-10-CM

## 2023-01-23 DIAGNOSIS — E782 Mixed hyperlipidemia: Secondary | ICD-10-CM

## 2023-01-23 DIAGNOSIS — I25110 Atherosclerotic heart disease of native coronary artery with unstable angina pectoris: Secondary | ICD-10-CM

## 2023-01-23 DIAGNOSIS — I10 Essential (primary) hypertension: Secondary | ICD-10-CM | POA: Diagnosis not present

## 2023-01-23 NOTE — Progress Notes (Signed)
 Established Patient Office Visit  Subjective:  Patient ID: [name] Browning, male    DOB: 10/26/1940  Age: 83 y.o. MRN: [id number]  Chief Complaint  Patient presents with   Follow-up    2 week follow up    Patient comes in for his follow-up.  Reports that he was unable to take Macrobid  after 2 doses as it caused nausea.  However he is not having any significant bladder issues at this time, and will wait to see the urologist.  He does not have any other complaints.    No other concerns at this time.   Past Medical History:  Diagnosis Date   Anginal pain (HCC)    Anxiety    Aortic atherosclerosis (HCC)    Atrial fibrillation (HCC) 01/2019   Bilateral carpal tunnel syndrome    CAD (coronary artery disease)    a.) LHC 2004 --> normal coronaries. b.) normal stress test in 2007 and 2011; c.) Lexiscan 05/20/2014 --> LVEF 55-65%; no significant stress induced ischemia/arrythmia. d.) CT chest 03/25/2019 --> coronaries carcified.   Carotid atherosclerosis, bilateral    Carpal tunnel syndrome, bilateral    CHF (congestive heart failure) (HCC)    Chronic anticoagulation    a.) ASA + apixaban    COPD (chronic obstructive pulmonary disease) (HCC)    CVA (cerebral vascular accident) (HCC)    Degenerative disc disease, cervical    Diastolic dysfunction    a.) TTE 05/29/2014 --> LVEF 60-65%; G1DD.   DVT (deep venous thrombosis) (HCC)    GERD (gastroesophageal reflux disease)    History of 2019 novel coronavirus disease (COVID-19) 02/08/2019   HLD (hyperlipidemia)    Hypertension    Kidney stones    Osteoarthritis of right shoulder    Pneumonia    Respiratory failure, acute (HCC) 09/20/2020   a.) severe respiratory distress 1 hour after urological surgery. CXR (+) for acute pulmonary edema. Transferred to ICU and placed on NIPPV. Questionable aspiration PNA. (+) A.fib with RVR. Improved with ABX, diuresis, and amiodarone .   Skin cancer of face    a.) RIGHT ear and RIGHT forehead; excised.    Syncope    TIA (transient ischemic attack) 2016   Valvular regurgitation    a.) TTE 05/26/2014 --> LVEF 60-65%; trivial MR, mild TR; no AR or PR. b.) TTE 04/20/2015 --> LVEF 55-60%; trivial MR and PR; no AR or TR.    Past Surgical History:  Procedure Laterality Date   CARDIAC CATHETERIZATION  2004   CARPAL TUNNEL RELEASE Right 06/11/2013   CENTRAL LINE INSERTION  11/14/2020   Procedure: CENTRAL LINE INSERTION;  Surgeon: Anner Alm ORN, MD;  Location: [facility] INVASIVE CV LAB;  Service: Cardiovascular;;   CORONARY/GRAFT ACUTE MI REVASCULARIZATION N/A 11/14/2020   Procedure: Coronary/Graft Acute MI Revascularization;  Surgeon: Anner Alm ORN, MD;  Location: [facility] INVASIVE CV LAB;  Service: Cardiovascular;  Laterality: N/A;   CYSTOSCOPY W/ RETROGRADES  10/16/2020   Procedure: CYSTOSCOPY WITH RETROGRADE PYELOGRAM;  Surgeon: Francisca Redell BROCKS, MD;  Location: [facility] ORS;  Service: Urology;;   CYSTOSCOPY W/ URETERAL STENT PLACEMENT Left 09/20/2020   Procedure: CYSTOSCOPY WITH RETROGRADE PYELOGRAM/URETERAL STENT PLACEMENT;  Surgeon: Selma Donnice SAUNDERS, MD;  Location: [facility] ORS;  Service: Urology;  Laterality: Left;   CYSTOSCOPY/URETEROSCOPY/HOLMIUM LASER/STENT PLACEMENT Left 10/16/2020   Procedure: CYSTOSCOPY/URETEROSCOPY/HOLMIUM LASER/STENT PLACEMENT;  Surgeon: Francisca Redell BROCKS, MD;  Location: [facility] ORS;  Service: Urology;  Laterality: Left;   ESOPHAGOGASTRODUODENOSCOPY N/A 11/06/2020   Procedure: ESOPHAGOGASTRODUODENOSCOPY (EGD);  Surgeon: Jinny Carmine, MD;  Location:  [facility] ENDOSCOPY;  Service: Endoscopy;  Laterality: N/A;   LEFT HEART CATH AND CORONARY ANGIOGRAPHY N/A 11/14/2020   Procedure: LEFT HEART CATH AND CORONARY ANGIOGRAPHY;  Surgeon: Anner Alm ORN, MD;  Location: [facility] INVASIVE CV LAB;  Service: Cardiovascular;  Laterality: N/A;   LEFT HEART CATH AND CORONARY ANGIOGRAPHY Left 01/28/2022   Procedure: LEFT HEART CATH AND CORONARY ANGIOGRAPHY;  Surgeon: [name], MD;  Location: [facility] INVASIVE CV  LAB;  Service: Cardiovascular;  Laterality: Left;   SHOULDER ARTHROSCOPY WITH OPEN ROTATOR CUFF REPAIR Right 08/24/2017   Procedure: SHOULDER ARTHROSCOPY WITH OPEN ROTATOR CUFF REPAIR;  Surgeon: Edie Norleen PARAS, MD;  Location: [facility] ORS;  Service: Orthopedics;  Laterality: Right;   SKIN CANCER EXCISION  12/01/2016   right ear    SKIN CANCER EXCISION     remove from the right side of the face    THROMBECTOMY Right 2004   leg   THYROIDECTOMY  1950   Not sure if total or partial thyroidectomy.    Social History   Socioeconomic History   Marital status: Legally Separated    Spouse name: Not on file   Number of children: 1   Years of education: Not on file   Highest education level: Not on file  Occupational History   Not on file  Tobacco Use   Smoking status: Former    Current packs/day: 0.00    Average packs/day: 1 pack/day for 46.1 years (46.1 ttl pk-yrs)    Types: Cigarettes    Start date: 01/10/1957    Quit date: 02/11/2003    Years since quitting: 19.9    Passive exposure: Past   Smokeless tobacco: Former    Types: Snuff    Quit date: 02/11/2003  Vaping Use   Vaping status: Never Used  Substance and Sexual Activity   Alcohol use: Not Currently    Alcohol/week: 7.0 standard drinks of alcohol    Types: 7 Cans of beer per week   Drug use: No   Sexual activity: Yes    Partners: Female  Other Topics Concern   Not on file  Social History Narrative   Live with grandaughter, April. 1 son that lives in [place].   Social Drivers of Corporate Investment Banker Strain: Not on file  Food Insecurity: No Food Insecurity (05/20/2022)   Hunger Vital Sign    Worried About Running Out of Food in the Last Year: Never true    Ran Out of Food in the Last Year: Never true  Transportation Needs: No Transportation Needs (05/20/2022)   PRAPARE - Administrator, Civil Service (Medical): No    Lack of Transportation (Non-Medical): No  Physical Activity: Not on file  Stress: Not on  file  Social Connections: Not on file  Intimate Partner Violence: Not At Risk (03/05/2022)   Humiliation, Afraid, Rape, and Kick questionnaire    Fear of Current or Ex-Partner: No    Emotionally Abused: No    Physically Abused: No    Sexually Abused: No    Family History  Problem Relation Age of Onset   Hypertension Mother    Heart disease Mother    CAD Father    Heart attack Father     Allergies  Allergen Reactions   Hydromorphone      Hallucination Pt reports he doesn't know about this    Outpatient Medications Prior to Visit  Medication Sig   acetaminophen  (TYLENOL ) 500 MG tablet Take 2 tablets (1,000 mg total) by  mouth every 6 (six) hours as needed for mild pain.   aspirin  EC 81 MG tablet Take 81 mg by mouth daily after supper.   atorvastatin  (LIPITOR ) 80 MG tablet Take 1 tablet (80 mg total) by mouth daily.   Budeson-Glycopyrrol-Formoterol  (BREZTRI  AEROSPHERE) 160-9-4.8 MCG/ACT AERO Inhale 2 puffs into the lungs in the morning and at bedtime.   cetirizine (ZYRTEC) 10 MG tablet Take 10 mg by mouth daily.   clopidogrel  (PLAVIX ) 75 MG tablet TAKE 1 TABLET BY MOUTH EVERY DAY   DULoxetine  (CYMBALTA ) 30 MG capsule TAKE 1 CAPSULE BY MOUTH EVERY DAY   Ensifentrine  (OHTUVAYRE ) 3 MG/2.5ML SUSP Inhale 2.5 mLs into the lungs 2 (two) times daily.   famotidine  (PEPCID ) 20 MG tablet TAKE 1 TABLET BY MOUTH AFTER SUPPER   ferrous gluconate  (FERGON) 240 (27 FE) MG tablet Take 240 mg by mouth 3 (three) times daily with meals.   finasteride  (PROSCAR ) 5 MG tablet Take 1 tablet (5 mg total) by mouth daily.   hyoscyamine  (ANASPAZ ) 0.125 MG TBDP disintergrating tablet Place 1 tablet (0.125 mg total) under the tongue every 6 (six) hours as needed.   isosorbide  mononitrate (IMDUR ) 30 MG 24 hr tablet Take 1 tablet (30 mg total) by mouth 2 (two) times daily.   ketorolac  (ACULAR ) 0.5 % ophthalmic solution Place 1 drop into the left eye 4 (four) times daily.   KLOR-CON  M20 20 MEQ tablet TAKE 1 TABLET  BY MOUTH EVERY DAY   losartan  (COZAAR ) 50 MG tablet TAKE 1 TABLET BY MOUTH EVERY DAY   Meth-Hyo-M Bl-Na Phos-Ph Sal (URIBEL ) 118 MG CAPS Take 1 capsule (118 mg total) by mouth every 6 (six) hours as needed.   methenamine  (HIPREX ) 1 g tablet Take 1 tablet (1 g total) by mouth 2 (two) times daily with a meal.   metoprolol  succinate (TOPROL  XL) 25 MG 24 hr tablet Take 1 tablet (25 mg total) by mouth daily.   montelukast  (SINGULAIR ) 10 MG tablet Take 10 mg by mouth at bedtime.   moxifloxacin (VIGAMOX) 0.5 % ophthalmic solution    Nebulizers (VIOS LC PLUS) MISC    ondansetron  (ZOFRAN -ODT) 4 MG disintegrating tablet Take 1 tablet (4 mg total) by mouth every 6 (six) hours as needed for nausea or vomiting.   pantoprazole  (PROTONIX ) 40 MG tablet TAKE 1 TABLET BY MOUTH TWICE A DAY   prednisoLONE acetate (PRED FORTE) 1 % ophthalmic suspension INSTILL 1 DROP INTO RIGHT EYE FOUR TIMES A DAY AS DIRECTED PLEASE SHAKE WELL BEFORE EACH USE!!   ranolazine  (RANEXA ) 1000 MG SR tablet TAKE 1 TABLET BY MOUTH TWICE A DAY   roflumilast  (DALIRESP ) 500 MCG TABS tablet Take 500 mcg by mouth daily.   sulfacetamide (BLEPH-10) 10 % ophthalmic solution SMARTSIG:In Eye(s)   tamsulosin  (FLOMAX ) 0.4 MG CAPS capsule Take 1 capsule (0.4 mg total) by mouth daily.   torsemide  (DEMADEX ) 20 MG tablet TAKE 3 TABLETS BY MOUTH EVERY DAY   No facility-administered medications prior to visit.    Review of Systems  Constitutional: Negative.  Negative for chills, fever, malaise/fatigue and weight loss.  HENT:  Positive for hearing loss. Negative for congestion and nosebleeds.   Eyes: Negative.   Respiratory: Negative.  Negative for cough and shortness of breath.   Cardiovascular: Negative.  Negative for chest pain, palpitations and leg swelling.  Gastrointestinal: Negative.  Negative for abdominal pain, constipation, diarrhea, heartburn, nausea and vomiting.  Genitourinary: Negative.  Negative for dysuria and flank pain.   Musculoskeletal: Negative.  Negative for joint  pain and myalgias.  Skin: Negative.   Neurological: Negative.  Negative for dizziness and headaches.  Endo/Heme/Allergies: Negative.   Psychiatric/Behavioral: Negative.  Negative for depression and suicidal ideas. The patient is not nervous/anxious.        Objective:   BP 102/60   Pulse (!) 59   Ht 6' 2 (1.88 m)   Wt 168 lb 3.2 oz (76.3 kg)   SpO2 91%   BMI 21.60 kg/m   Vitals:   01/23/23 1100  BP: 102/60  Pulse: (!) 59  Height: 6' 2 (1.88 m)  Weight: 168 lb 3.2 oz (76.3 kg)  SpO2: 91%  BMI (Calculated): 21.59    Physical Exam Vitals and nursing note reviewed.  Constitutional:      Appearance: Normal appearance.  HENT:     Head: Normocephalic and atraumatic.     Nose: Nose normal.     Mouth/Throat:     Mouth: Mucous membranes are moist.     Pharynx: Oropharynx is clear.  Eyes:     Conjunctiva/sclera: Conjunctivae normal.     Pupils: Pupils are equal, round, and reactive to light.  Cardiovascular:     Rate and Rhythm: Normal rate and regular rhythm.     Pulses: Normal pulses.     Heart sounds: Normal heart sounds.  Pulmonary:     Effort: Pulmonary effort is normal.     Breath sounds: Normal breath sounds.  Abdominal:     General: Bowel sounds are normal.     Palpations: Abdomen is soft.  Musculoskeletal:        General: Normal range of motion.     Cervical back: Normal range of motion.  Skin:    General: Skin is warm and dry.  Neurological:     General: No focal deficit present.     Mental Status: He is alert and oriented to person, place, and time.  Psychiatric:        Mood and Affect: Mood normal.        Behavior: Behavior normal.        Judgment: Judgment normal.      No results found for any visits on 01/23/23.  Recent Results (from the past 2160 hours)  CBC with Diff     Status: None   Collection Time: 11/21/22 10:30 AM  Result Value Ref Range   WBC 7.3 3.4 - 10.8 x10E3/uL   RBC 4.46 4.14  - 5.80 x10E6/uL   Hemoglobin 13.4 13.0 - 17.7 g/dL   Hematocrit 58.1 62.4 - 51.0 %   MCV 94 79 - 97 fL   MCH 30.0 26.6 - 33.0 pg   MCHC 32.1 31.5 - 35.7 g/dL   RDW 87.7 88.3 - 84.5 %   Platelets 195 150 - 450 x10E3/uL   Neutrophils 73 Not Estab. %   Lymphs 17 Not Estab. %   Monocytes 8 Not Estab. %   Eos 1 Not Estab. %   Basos 1 Not Estab. %   Neutrophils Absolute 5.3 1.4 - 7.0 x10E3/uL   Lymphocytes Absolute 1.2 0.7 - 3.1 x10E3/uL   Monocytes Absolute 0.6 0.1 - 0.9 x10E3/uL   EOS (ABSOLUTE) 0.1 0.0 - 0.4 x10E3/uL   Basophils Absolute 0.0 0.0 - 0.2 x10E3/uL   Immature Granulocytes 0 Not Estab. %   Immature Grans (Abs) 0.0 0.0 - 0.1 x10E3/uL  Lipid Panel w/o Chol/HDL Ratio     Status: Abnormal   Collection Time: 11/21/22 10:30 AM  Result Value Ref Range   Cholesterol, Total  283 (H) 100 - 199 mg/dL   Triglycerides 98 0 - 149 mg/dL   HDL 45 >60 mg/dL   VLDL Cholesterol Cal 17 5 - 40 mg/dL   LDL Chol Calc (NIH) 778 (H) 0 - 99 mg/dL   LDL CALC COMMENT: Comment     Comment: Consider evaluating for Familial Hypercholesterolemia(FH), if clinically indicated.   CMP14+EGFR     Status: Abnormal   Collection Time: 11/21/22 10:30 AM  Result Value Ref Range   Glucose 90 70 - 99 mg/dL   BUN 24 8 - 27 mg/dL   Creatinine, Ser 8.66 (H) 0.76 - 1.27 mg/dL   eGFR 54 (L) >40 fO/fpw/8.26   BUN/Creatinine Ratio 18 10 - 24   Sodium 142 134 - 144 mmol/L   Potassium 4.6 3.5 - 5.2 mmol/L   Chloride 99 96 - 106 mmol/L   CO2 29 20 - 29 mmol/L   Calcium  10.0 8.6 - 10.2 mg/dL   Total Protein 7.5 6.0 - 8.5 g/dL   Albumin  4.4 3.7 - 4.7 g/dL   Globulin, Total 3.1 1.5 - 4.5 g/dL   Bilirubin Total 0.7 0.0 - 1.2 mg/dL   Alkaline Phosphatase 180 (H) 44 - 121 IU/L   AST 21 0 - 40 IU/L   ALT 12 0 - 44 IU/L  Vitamin D  (25 hydroxy)     Status: None   Collection Time: 11/21/22 10:30 AM  Result Value Ref Range   Vit D, 25-Hydroxy 35.9 30.0 - 100.0 ng/mL    Comment: Vitamin D  deficiency has been defined by  the Institute of Medicine and an Endocrine Society practice guideline as a level of serum 25-OH vitamin D  less than 20 ng/mL (1,2). The Endocrine Society went on to further define vitamin D  insufficiency as a level between 21 and 29 ng/mL (2). 1. IOM (Institute of Medicine). 2010. Dietary reference    intakes for calcium  and D. [place]: The    [employer]. 2. Holick MF, Binkley [place], Bischoff-Ferrari HA, et al.    Evaluation, treatment, and prevention of vitamin D     deficiency: an Endocrine Society clinical practice    guideline. JCEM. 2011 Jul; 96(7):1911-30.   POCT Urinalysis Dipstick (18997)     Status: Abnormal   Collection Time: 11/21/22 11:40 AM  Result Value Ref Range   Color, UA yellow    Clarity, UA slightly cloudy    Glucose, UA Negative Negative   Bilirubin, UA negative    Ketones, UA negative    Spec Grav, UA 1.010 1.010 - 1.025   Blood, UA negative    pH, UA 5.0 5.0 - 8.0   Protein, UA Negative Negative   Urobilinogen, UA 0.2 0.2 or 1.0 E.U./dL   Nitrite, UA negative    Leukocytes, UA Large (3+) (A) Negative   Appearance     Odor    POC CREATINE & ALBUMIN ,URINE     Status: Abnormal   Collection Time: 11/21/22 11:43 AM  Result Value Ref Range   Microalbumin Ur, POC 10 mg/L   Creatinine, POC 50 mg/dL   Albumin /Creatinine Ratio, Urine, POC >300   Urinalysis     Status: Abnormal   Collection Time: 11/21/22  3:38 PM  Result Value Ref Range   Specific Gravity, UA 1.011 1.005 - 1.030   pH, UA 6.0 5.0 - 7.5   Color, UA Yellow Yellow   Appearance Ur Clear Clear   Leukocytes,UA 3+ (A) Negative   Protein,UA Negative Negative/Trace   Glucose, UA Negative  Negative   Ketones, UA Negative Negative   RBC, UA Negative Negative   Bilirubin, UA Negative Negative   Urobilinogen, Ur 0.2 0.2 - 1.0 mg/dL   Nitrite, UA Negative Negative  Urine Culture     Status: Abnormal   Collection Time: 11/21/22  3:40 PM   Specimen: Urine   UC  Result Value Ref Range    Urine Culture, Routine Final report (A)    Organism ID, Bacteria Escherichia coli (A)     Comment: Multi-Drug Resistant Organism Susceptibility profile is consistent with a probable ESBL. Greater than 100,000 colony forming units per mL    Antimicrobial Susceptibility Comment     Comment:       ** S = Susceptible; I = Intermediate; R = Resistant **                    P = Positive; N = Negative             MICS are expressed in micrograms per mL    Antibiotic                 RSLT#1    RSLT#2    RSLT#3    RSLT#4 Amoxicillin /Clavulanic Acid    S Ampicillin                      R Cefazolin                       R Cefepime                        R Ceftriaxone                     R Cefuroxime                      R Ciprofloxacin                  R Ertapenem                       S Gentamicin                      S Imipenem                       S Levofloxacin                   R Meropenem                       S Nitrofurantoin                  S Piperacillin /Tazobactam        S Tetracycline                   R Tobramycin                     S Trimethoprim/Sulfa             R   POCT Urinalysis Dipstick (18997)     Status: Abnormal   Collection Time: 01/09/23 11:27 AM  Result Value Ref Range   Color, UA yellow    Clarity, UA cloudy    Glucose, UA Negative Negative   Bilirubin, UA neg    Ketones, UA neg    Spec Grav,  UA 1.010 1.010 - 1.025   Blood, UA trace    pH, UA 6.0 5.0 - 8.0   Protein, UA Negative Negative   Urobilinogen, UA 0.2 0.2 or 1.0 E.U./dL   Nitrite, UA neg    Leukocytes, UA Large (3+) (A) Negative   Appearance cloudy    Odor yes   Urine Culture     Status: Abnormal   Collection Time: 01/09/23  4:12 PM   Specimen: Urine   UC  Result Value Ref Range   Urine Culture, Routine Final report (A)    Organism ID, Bacteria Escherichia coli (A)     Comment: Multi-Drug Resistant Organism Susceptibility profile is consistent with a probable ESBL. Greater than 100,000 colony  forming units per mL    Antimicrobial Susceptibility Comment     Comment:       ** S = Susceptible; I = Intermediate; R = Resistant **                    P = Positive; N = Negative             MICS are expressed in micrograms per mL    Antibiotic                 RSLT#1    RSLT#2    RSLT#3    RSLT#4 Amoxicillin /Clavulanic Acid    S Ampicillin                      R Cefazolin                       R Cefepime                        S Ceftriaxone                     R Cefuroxime                      R Ciprofloxacin                  R Ertapenem                       S Gentamicin                      S Imipenem                       S Levofloxacin                   R Meropenem                       S Nitrofurantoin                  S Piperacillin /Tazobactam        S Tetracycline                   R Tobramycin                     S Trimethoprim/Sulfa             R       Assessment & Plan:  Patient advised to continue taking all the medications. Advised to follow-up with urologist if he has any more bladder issues. Problem List Items Addressed  This Visit     Essential hypertension, benign - Primary   Hyperlipidemia   Coronary artery disease   BPH (benign prostatic hyperplasia)   GAD (generalized anxiety disorder)    Return in about 3 months (around 04/23/2023).   Total time spent: 25 minutes  FERNAND FREDY RAMAN, MD  01/23/2023   This document may have been prepared by [facility] Voice Recognition software and as such may include unintentional dictation errors.

## 2023-01-25 ENCOUNTER — Other Ambulatory Visit: Payer: Self-pay | Admitting: Urology

## 2023-01-26 ENCOUNTER — Other Ambulatory Visit: Payer: Self-pay | Admitting: Cardiovascular Disease

## 2023-01-26 DIAGNOSIS — I480 Paroxysmal atrial fibrillation: Secondary | ICD-10-CM

## 2023-01-26 DIAGNOSIS — I5032 Chronic diastolic (congestive) heart failure: Secondary | ICD-10-CM

## 2023-01-26 DIAGNOSIS — Z955 Presence of coronary angioplasty implant and graft: Secondary | ICD-10-CM

## 2023-01-26 DIAGNOSIS — R079 Chest pain, unspecified: Secondary | ICD-10-CM

## 2023-01-26 DIAGNOSIS — I10 Essential (primary) hypertension: Secondary | ICD-10-CM

## 2023-02-07 DIAGNOSIS — Z041 Encounter for examination and observation following transport accident: Secondary | ICD-10-CM | POA: Diagnosis not present

## 2023-02-07 DIAGNOSIS — S161XXA Strain of muscle, fascia and tendon at neck level, initial encounter: Secondary | ICD-10-CM | POA: Diagnosis not present

## 2023-02-09 ENCOUNTER — Ambulatory Visit: Payer: Medicare HMO | Admitting: Internal Medicine

## 2023-02-09 ENCOUNTER — Encounter: Payer: Self-pay | Admitting: Internal Medicine

## 2023-02-09 VITALS — BP 116/64 | HR 59 | Temp 97.8°F | Ht 74.0 in | Wt 162.8 lb

## 2023-02-09 DIAGNOSIS — J4489 Other specified chronic obstructive pulmonary disease: Secondary | ICD-10-CM

## 2023-02-09 DIAGNOSIS — R0689 Other abnormalities of breathing: Secondary | ICD-10-CM

## 2023-02-09 DIAGNOSIS — J9611 Chronic respiratory failure with hypoxia: Secondary | ICD-10-CM

## 2023-02-09 DIAGNOSIS — J439 Emphysema, unspecified: Secondary | ICD-10-CM

## 2023-02-09 NOTE — Patient Instructions (Signed)
Continue all nebulizers and inhalers as prescribed  Recommend granddaughter at next office visit  Avoid Allergens and Irritants Avoid secondhand smoke Avoid SICK contacts Recommend  Masking  when appropriate Recommend Keep up-to-date with vaccinations

## 2023-02-09 NOTE — Progress Notes (Signed)
[name] Browning, male    DOB: 05-01-1940,  MRN: [id number]   SYNOPSIS 83 year old male, former smoker followed for COPD with chronic bronchitis and emphysema and chronic respiratory failure on supplemental O2.  Past medical history significant for HTN, PAF, CHF, hx of DVT, hx of STEMI, allergic rhinitis, GERD, CKD, BPH, HLD. PREVIOUS ADMISSIONS First admission was from 10/20/2020 to 10/31/2020 when he was admitted with respiratory failure due to COPD and pneumonia, as well as an obstructive left renal stone.  He developed hypotension and worsening respiratory distress requiring pressors and BiPAP.     Blood cultures grew positive for Enterococcus faecalis and staph hemolyticus.  He was treated with antibiotics ending 11/03/2020.  The patient was recently admitted to the hospital from 10/25-10/28 with complaints of dysphagia.     He was seen by gastroenterology who underwent an EGD with no significant findings.  Since that time he has had progressive worsening shortness of breath, eventually presented to the emergency department where he was found to have an oxygen saturation of 88% on his home 2 L of oxygen.  His labs are otherwise notable for a troponin of 620, BNP of 220, hemoglobin of 8.2 (9.7 on 11/06/2020).  Chest x-ray shows diffuse bilateral infiltrates which could be consistent with edema but some overlying scar.   TESTS/EVENTS: 11/15/2020 echo: EF 60 to 65%.  G1 DD.  RV function severely reduced and enlarged.  LA severely dilated.  RA severely dilated.  Mild MR. 07/03/2022 CTA chest: No PE.  Atherosclerosis/CAD.  Cardiomegaly.  Emphysema.  Nodular biapical scarring, unchanged.  Mild bilateral gynecomastia  06/09/2022: OV Significant SOB/DOE. Chronic productive cough/chest congestion over the last 8 months. Several hospitalizations over the last year. Progressive respiratory disease. Chronic hypoxic respiratory failure; currently on 5 lpm. COPD progressed to end stage with previous infections  and hospitalizations. Started on [employer] inhaler previously. Was also given 20 mg daily at last OV. Started on daliresp. Recommended pulmonary rehab.        CC Follow-up assessment for COPD Follow-up assessment for chronic hypoxic respiratory failure Chronic assessment for respiratory sufficiency   HPI Shortness of breath and dyspnea on exertion Patient started on phosphodiesterase inhibitor nebulized therapy This seems to be helping Patient states his granddaughter helps with his medications and nebulizers  Patient with chronic productive cough and chest congestion Several hospitalizations over the last couple years  No exacerbation at this time No evidence of heart failure at this time No evidence or signs of infection at this time No respiratory distress No fevers, chills, nausea, vomiting, diarrhea No evidence of lower extremity edema No evidence hemoptysis  Chronic hypoxic respiratory failure due to COPD Patient has weaned himself off oxygen Patient does not want to be tested for overnight pulse oximetry I have recommended that he bring his granddaughter to the next office visit    Past Medical History:  Diagnosis Date   Anginal pain (HCC)    Anxiety    Aortic atherosclerosis (HCC)    Atrial fibrillation (HCC) 01/2019   Bilateral carpal tunnel syndrome    CAD (coronary artery disease)    a.) LHC 2004 --> normal coronaries. b.) normal stress test in 2007 and 2011; c.) Lexiscan 05/20/2014 --> LVEF 55-65%; no significant stress induced ischemia/arrythmia. d.) CT chest 03/25/2019 --> coronaries carcified.   Carotid atherosclerosis, bilateral    Carpal tunnel syndrome, bilateral    CHF (congestive heart failure) (HCC)    Chronic anticoagulation    a.) ASA + apixaban  COPD (chronic obstructive pulmonary disease) (HCC)    CVA (cerebral vascular accident) (HCC)    Degenerative disc disease, cervical    Diastolic dysfunction    a.) TTE 05/29/2014 --> LVEF 60-65%;  G1DD.   DVT (deep venous thrombosis) (HCC)    GERD (gastroesophageal reflux disease)    History of 2019 novel coronavirus disease (COVID-19) 02/08/2019   HLD (hyperlipidemia)    Hypertension    Kidney stones    Osteoarthritis of right shoulder    Pneumonia    Respiratory failure, acute (HCC) 09/20/2020   a.) severe respiratory distress 1 hour after urological surgery. CXR (+) for acute pulmonary edema. Transferred to ICU and placed on NIPPV. Questionable aspiration PNA. (+) A.fib with RVR. Improved with ABX, diuresis, and amiodarone.   Skin cancer of face    a.) RIGHT ear and RIGHT forehead; excised.   Syncope    TIA (transient ischemic attack) 2016   Valvular regurgitation    a.) TTE 05/26/2014 --> LVEF 60-65%; trivial MR, mild TR; no AR or PR. b.) TTE 04/20/2015 --> LVEF 55-60%; trivial MR and PR; no AR or TR.    Outpatient Medications Prior to Visit  Medication Sig Dispense Refill   acetaminophen (TYLENOL) 500 MG tablet Take 2 tablets (1,000 mg total) by mouth every 6 (six) hours as needed for mild pain.     amLODipine (NORVASC) 5 MG tablet Take 1 tablet (5 mg total) by mouth daily after supper. 30 tablet 2   apixaban (ELIQUIS) 5 MG TABS tablet Take 1 tablet (5 mg total) by mouth 2 (two) times daily. 60 tablet 2   aspirin EC 81 MG tablet Take 81 mg by mouth daily after supper.     atorvastatin (LIPITOR) 40 MG tablet Take 40 mg by mouth daily after supper.     Ferrous Gluconate (IRON) 240 (27 Fe) MG TABS Take 27 mg by mouth daily after supper.     furosemide (LASIX) 20 MG tablet Take 1 tablet (20 mg total) by mouth daily after breakfast. 30 tablet 2   guaiFENesin-dextromethorphan (ROBITUSSIN DM) 100-10 MG/5ML syrup Take 5 mLs by mouth every 4 (four) hours as needed for cough. 118 mL 0   losartan (COZAAR) 100 MG tablet Take 100 mg by mouth daily after supper.     montelukast (SINGULAIR) 10 MG tablet Take 10 mg by mouth at bedtime.     pantoprazole (PROTONIX) 40 MG tablet Take 40 mg by  mouth daily after supper.     phenazopyridine (PYRIDIUM) 200 MG tablet Take 1 tablet (200 mg total) by mouth 3 (three) times daily as needed (bladder pain). 10 tablet 0   polyethylene glycol (MIRALAX / GLYCOLAX) 17 g packet Take 17 g by mouth daily as needed for mild constipation or moderate constipation. (Patient taking differently: Take 17 g by mouth daily.) 14 each 0                  No facility-administered medications prior to visit.     BP 116/64 (BP Location: Left Arm, Patient Position: Sitting, Cuff Size: Normal)   Pulse (!) 59   Temp 97.8 F (36.6 C) (Temporal)   Ht 6\' 2"  (1.88 m)   Wt 162 lb 12.8 oz (73.8 kg)   SpO2 95%   BMI 20.90 kg/m      Review of Systems: Gen:  Denies  fever, sweats, chills weight loss  HEENT: Denies blurred vision, double vision, ear pain, eye pain, hearing loss, nose bleeds, sore throat Cardiac:  No dizziness, chest pain or heaviness, chest tightness,edema, No JVD Resp:   No cough, -sputum production, -shortness of breath,-wheezing, -hemoptysis,  Other:  All other systems negative   Physical Examination:   General Appearance: No distress  EYES PERRLA, EOM intact.   NECK Supple, No JVD Pulmonary: normal breath sounds, No wheezing.  CardiovascularNormal S1,S2.  No m/r/g.   Abdomen: Benign, Soft, non-tender. Neurology UE/LE 5/5 strength, no focal deficits Ext pulses intact, cap refill intact ALL OTHER ROS ARE NEGATIVE    Labs ordered/ reviewed:      Chemistry      Component Value Date/Time   NA 142 11/21/2022 1030   NA 144 08/12/2013 0546   K 4.6 11/21/2022 1030   K 4.5 08/12/2013 0546   CL 99 11/21/2022 1030   CL 113 (H) 08/12/2013 0546   CO2 29 11/21/2022 1030   CO2 26 08/12/2013 0546   BUN 24 11/21/2022 1030   BUN 25 (H) 08/12/2013 0546   CREATININE 1.33 (H) 11/21/2022 1030   CREATININE 1.00 06/08/2016 1044      Component Value Date/Time   CALCIUM 10.0 11/21/2022 1030   CALCIUM 8.3 (L) 08/12/2013 0546   ALKPHOS 180  (H) 11/21/2022 1030   ALKPHOS 112 08/10/2013 1703   AST 21 11/21/2022 1030   AST 27 08/10/2013 1703   ALT 12 11/21/2022 1030   ALT 22 08/10/2013 1703   BILITOT 0.7 11/21/2022 1030   BILITOT 0.7 08/10/2013 1703            Assessment   83 year old pleasant white male seen today for follow-up assessment for COPD with several bouts of's COVID-19 infection last several years with progressive respiratory insufficiency with diastolic dysfunction    COPD Progression to end-stage with previous infections and hospitalizations Previously started on [employer] inhaler therapy I have added phosphodiesterase nebulized therapy to his regimen No exacerbation at this time We will plan to continueOhtuvayre Nebulizer Inhaler No longer can take traditional inhaler therapy   Chronic hypoxic respiratory failure Patient weaned himself oxygen Will plan to revisit this assessment I recommend bringing granddaughter for next office visit    Severe respiratory insufficiency No progression of disease at this time No exacerbation at this time No evidence of heart failure at this time No evidence or signs of infection at this time No respiratory distress No fevers, chills, nausea, vomiting, diarrhea No evidence of lower extremity edema No evidence hemoptysis    MEDICATION ADJUSTMENTS/LABS AND TESTS ORDERED: Continue all nebulizers and inhalers as prescribed Recommend granddaughter at next office visit Avoid Allergens and Irritants Avoid secondhand smoke Avoid SICK contacts Recommend  Masking  when appropriate Recommend Keep up-to-date with vaccinations  CURRENT MEDICATIONS REVIEWED AT LENGTH WITH PATIENT TODAY   Patient  satisfied with Plan of action and management. All questions answered   Follow up  6 months   I spent a total of 42 minutes reviewing chart data, face-to-face evaluation with the patient, counseling and coordination of care as detailed above.     Lucie Leather,  M.D.  [facility]  Medical Director [facility] Medical Director [facility] Cardio-Pulmonary Department

## 2023-02-11 ENCOUNTER — Other Ambulatory Visit: Payer: Self-pay | Admitting: Internal Medicine

## 2023-02-20 NOTE — Progress Notes (Signed)
02/21/2023 10:09 AM   [name] Browning 1940-04-28 [id number]  Referring provider: [facility] [address] [place],  [place] [address]  Urological history: 1.  Nephrolithiasis -left URS (09/2020)  -CT (02/2022) - 1 mm right upper pole stone, 10 mm left lower pole stone w/ 2 mm and 3 mm left renal stones   2. rUTI's -contributing factors of age, constipation and inadequate bladder emptying -documented urine cultures over the last year  01/09/2023 - E.coli  11/21/2022 - E.coli  06/11/12024 - MDRO E.coli            04/26/202 - ESBL E.coli            3. BPH with LU TS -PSA (2021)- 4.35 -prostate volume (2023) - 100 cc  -cysto (08/2021) - high bladder neck  Chief Complaint  Patient presents with   Other    LUTS   HPI: [name] Browning is a 83 y.o. male who presents today for follow up.   Previous records reviewed.   I PSS 17/5  PVR 25 mL   His symptoms have unchanged.  He was seen by Dr. Arita Miss up at [facility] urology and she recommended Prelief, which she did not take and urodynamics.  He is having urgency, dysuria, difficulty urinating, weak urinary stream, lower abdominal pain and genital pain.  Patient denies any modifying or aggravating factors.  Patient denies any recent UTI's, gross hematuria or flank pain.  Patient denies any fevers, chills, nausea or vomiting.    UA yellow clear, specific every 1.015, pH 5.5, leukocyte +1, 11-30 WBC, 0-2 RBCs, 0-10 Metheney cells, hyaline cast present, few bacteria.   IPSS     Row Name 02/21/23 0900         International Prostate Symptom Score   How often have you had the sensation of not emptying your bladder? Less than half the time     How often have you had to urinate less than every two hours? Less than half the time     How often have you found you stopped and started again several times when you urinated? Less than half the time     How often have you found it difficult to postpone urination? Less than half the time      How often have you had a weak urinary stream? About half the time     How often have you had to strain to start urination? About half the time     How many times did you typically get up at night to urinate? 3 Times     Total IPSS Score 17       Quality of Life due to urinary symptoms   If you were to spend the rest of your life with your urinary condition just the way it is now how would you feel about that? Unhappy              Score:  1-7 Mild 8-19 Moderate 20-35 Severe    PMH: Past Medical History:  Diagnosis Date   Anginal pain (HCC)    Anxiety    Aortic atherosclerosis (HCC)    Atrial fibrillation (HCC) 01/2019   Bilateral carpal tunnel syndrome    CAD (coronary artery disease)    a.) LHC 2004 --> normal coronaries. b.) normal stress test in 2007 and 2011; c.) Lexiscan 05/20/2014 --> LVEF 55-65%; no significant stress induced ischemia/arrythmia. d.) CT chest 03/25/2019 --> coronaries carcified.   Carotid atherosclerosis, bilateral  Carpal tunnel syndrome, bilateral    CHF (congestive heart failure) (HCC)    Chronic anticoagulation    a.) ASA + apixaban   COPD (chronic obstructive pulmonary disease) (HCC)    CVA (cerebral vascular accident) (HCC)    Degenerative disc disease, cervical    Diastolic dysfunction    a.) TTE 05/29/2014 --> LVEF 60-65%; G1DD.   DVT (deep venous thrombosis) (HCC)    GERD (gastroesophageal reflux disease)    History of 2019 novel coronavirus disease (COVID-19) 02/08/2019   HLD (hyperlipidemia)    Hypertension    Kidney stones    Osteoarthritis of right shoulder    Pneumonia    Respiratory failure, acute (HCC) 09/20/2020   a.) severe respiratory distress 1 hour after urological surgery. CXR (+) for acute pulmonary edema. Transferred to ICU and placed on NIPPV. Questionable aspiration PNA. (+) A.fib with RVR. Improved with ABX, diuresis, and amiodarone.   Skin cancer of face    a.) RIGHT ear and RIGHT forehead; excised.   Syncope     TIA (transient ischemic attack) 2016   Valvular regurgitation    a.) TTE 05/26/2014 --> LVEF 60-65%; trivial MR, mild TR; no AR or PR. b.) TTE 04/20/2015 --> LVEF 55-60%; trivial MR and PR; no AR or TR.    Surgical History: Past Surgical History:  Procedure Laterality Date   CARDIAC CATHETERIZATION  2004   CARPAL TUNNEL RELEASE Right 06/11/2013   CENTRAL LINE INSERTION  11/14/2020   Procedure: CENTRAL LINE INSERTION;  Surgeon: Marykay Lex, MD;  Location: [facility] INVASIVE CV LAB;  Service: Cardiovascular;;   CORONARY/GRAFT ACUTE MI REVASCULARIZATION N/A 11/14/2020   Procedure: Coronary/Graft Acute MI Revascularization;  Surgeon: Marykay Lex, MD;  [facility]VASIVE [facility] LAB;  Service: Cardiovascular;  Laterality: N/A;   CYSTOSCOPY W/ RETROGRADES  10/16/2020   Procedure: CYSTOSCOPY WITH RETROGRADE PYELOGRAM;  Surgeon: Sondra Come, MD;  Location: [facility] ORS;  Service: Urology;;   CYSTOSCOPY W/ URETERAL STENT PLACEMENT Left 09/20/2020   Procedure: CYSTOSCOPY WITH RETROGRADE PYELOGRAM/URETERAL STENT PLACEMENT;  Surgeon: Jannifer Hick, MD;  Location: [facility] ORS;  Service: Urology;  Laterality: Left;   CYSTOSCOPY/URETEROSCOPY/HOLMIUM LASER/STENT PLACEMENT Left 10/16/2020   Procedure: CYSTOSCOPY/URETEROSCOPY/HOLMIUM LASER/STENT PLACEMENT;  Surgeon: Sondra Come, MD;  Location: [facility] ORS;  Service: Urology;  Laterality: Left;   ESOPHAGOGASTRODUODENOSCOPY N/A 11/06/2020   Procedure: ESOPHAGOGASTRODUODENOSCOPY (EGD);  Surgeon: Midge Minium, MD;  Location: [facility] ENDOSCOPY;  Service: Endoscopy;  Laterality: N/A;   LEFT HEART CATH AND CORONARY ANGIOGRAPHY N/A 11/14/2020   Procedure: LEFT HEART CATH AND CORONARY ANGIOGRAPHY;  Surgeon: Marykay Lex, MD;  Location: [facility] INVASIVE CV LAB;  Service: Cardiovascular;  Laterality: N/A;   LEFT HEART CATH AND CORONARY ANGIOGRAPHY Left 01/28/2022   Procedure: LEFT HEART CATH AND CORONARY ANGIOGRAPHY;  Surgeon: Laurier Nancy, MD;  Location: [facility]  INVASIVE CV LAB;  Service: Cardiovascular;  Laterality: Left;   SHOULDER ARTHROSCOPY WITH OPEN ROTATOR CUFF REPAIR Right 08/24/2017   Procedure: SHOULDER ARTHROSCOPY WITH OPEN ROTATOR CUFF REPAIR;  Surgeon: Christena Flake, MD;  Location: [facility] ORS;  Service: Orthopedics;  Laterality: Right;   SKIN CANCER EXCISION  12/01/2016   right ear    SKIN CANCER EXCISION     remove from the right side of the face    THROMBECTOMY Right 2004   leg   THYROIDECTOMY  1950   Not sure if total or partial thyroidectomy.    Home Medications:  Allergies as of 02/21/2023  Reactions   Hydromorphone    Hallucination Pt reports he doesn't know about this        Medication List        Accurate as of February 21, 2023 10:09 AM. If you have any questions, ask your nurse or doctor.          acetaminophen 500 MG tablet Commonly known as: TYLENOL Take 2 tablets (1,000 mg total) by mouth every 6 (six) hours as needed for mild pain.   amoxicillin-clavulanate 875-125 MG tablet Commonly known as: AUGMENTIN Take by mouth.   aspirin EC 81 MG tablet Take 81 mg by mouth daily after supper.   atorvastatin 80 MG tablet Commonly known as: Lipitor Take 1 tablet (80 mg total) by mouth daily.   Breztri Aerosphere 160-9-4.8 MCG/ACT Aero Generic drug: Budeson-Glycopyrrol-Formoterol Inhale 2 puffs into the lungs in the morning and at bedtime.   cetirizine 10 MG tablet Commonly known as: ZYRTEC Take 10 mg by mouth daily.   clopidogrel 75 MG tablet Commonly known as: PLAVIX TAKE 1 TABLET BY MOUTH EVERY DAY   DULoxetine 30 MG capsule Commonly known as: CYMBALTA TAKE 1 CAPSULE BY MOUTH EVERY DAY   famotidine 20 MG tablet Commonly known as: PEPCID TAKE 1 TABLET BY MOUTH AFTER SUPPER   ferrous gluconate 240 (27 FE) MG tablet Commonly known as: FERGON Take 240 mg by mouth 3 (three) times daily with meals.   finasteride 5 MG tablet Commonly known as: PROSCAR Take 1 tablet (5 mg total) by mouth  daily.   hyoscyamine 0.125 MG Tbdp disintergrating tablet Commonly known as: ANASPAZ Place 1 tablet (0.125 mg total) under the tongue every 6 (six) hours as needed.   isosorbide mononitrate 30 MG 24 hr tablet Commonly known as: IMDUR TAKE 1 TABLET BY MOUTH 2 TIMES DAILY.   ketorolac 0.5 % ophthalmic solution Commonly known as: ACULAR Place 1 drop into the left eye 4 (four) times daily.   Klor-Con M20 20 MEQ tablet Generic drug: potassium chloride SA TAKE 1 TABLET BY MOUTH EVERY DAY   losartan 50 MG tablet Commonly known as: COZAAR TAKE 1 TABLET BY MOUTH EVERY DAY   methenamine 1 g tablet Commonly known as: Hiprex Take 1 tablet (1 g total) by mouth 2 (two) times daily with a meal.   metoprolol succinate 25 MG 24 hr tablet Commonly known as: Toprol XL Take 1 tablet (25 mg total) by mouth daily.   montelukast 10 MG tablet Commonly known as: SINGULAIR Take 10 mg by mouth at bedtime.   moxifloxacin 0.5 % ophthalmic solution Commonly known as: VIGAMOX   Ohtuvayre 3 MG/2.5ML Susp Generic drug: Ensifentrine Inhale 2.5 mLs into the lungs 2 (two) times daily.   ondansetron 4 MG disintegrating tablet Commonly known as: ZOFRAN-ODT Take 1 tablet (4 mg total) by mouth every 6 (six) hours as needed for nausea or vomiting.   pantoprazole 40 MG tablet Commonly known as: PROTONIX TAKE 1 TABLET BY MOUTH TWICE A DAY   prednisoLONE acetate 1 % ophthalmic suspension Commonly known as: PRED FORTE INSTILL 1 DROP INTO RIGHT EYE FOUR TIMES A DAY AS DIRECTED PLEASE SHAKE WELL BEFORE EACH USE!!   ranolazine 1000 MG SR tablet Commonly known as: RANEXA TAKE 1 TABLET BY MOUTH TWICE A DAY   roflumilast 500 MCG Tabs tablet Commonly known as: DALIRESP Take 500 mcg by mouth daily.   sulfacetamide 10 % ophthalmic solution Commonly known as: BLEPH-10 SMARTSIG:In Eye(s)   tamsulosin 0.4 MG Caps capsule Commonly known  as: FLOMAX Take 1 capsule (0.4 mg total) by mouth daily.   torsemide  20 MG tablet Commonly known as: DEMADEX TAKE 3 TABLETS BY MOUTH EVERY DAY   Uribel 118 MG Caps Take 1 capsule (118 mg total) by mouth every 6 (six) hours as needed.   Vios LC Plus Misc        Allergies:  Allergies  Allergen Reactions   Hydromorphone     Hallucination Pt reports he doesn't know about this    Family History: Family History  Problem Relation Age of Onset   Hypertension Mother    Heart disease Mother    CAD Father    Heart attack Father     Social History:  reports that he quit smoking about 20 years ago. His smoking use included cigarettes. He started smoking about 66 years ago. He has a 46.1 pack-year smoking history. He has been exposed to tobacco smoke. He quit smokeless tobacco use about 20 years ago.  His smokeless tobacco use included snuff. He reports that he does not currently use alcohol after a past usage of about 7.0 standard drinks of alcohol per week. He reports that he does not use drugs.  ROS: Pertinent ROS in HPI  Physical Exam: BP 131/64   Pulse 67   Ht 6\' 2"  (1.88 m)   Wt 162 lb 4.8 oz (73.6 kg)   BMI 20.84 kg/m   Constitutional:  Well nourished. Alert and oriented, No acute distress. HEENT: [place] AT, moist mucus membranes.  Trachea midline, no masses. Cardiovascular: No clubbing, cyanosis, or edema. Respiratory: Normal respiratory effort, no increased work of breathing. Neurologic: Grossly intact, no focal deficits, moving all 4 extremities. Psychiatric: Normal mood and affect.   Laboratory Data: Comprehensive Metabolic Panel (CMP) Order: [id number] Component Ref Range & Units 6 d ago  Glucose 70 - 110 mg/dL 829  Sodium 562 - 130 mmol/L 139  Potassium 3.6 - 5.1 mmol/L 4.1  Chloride 97 - 109 mmol/L 100  Carbon Dioxide (CO2) 22.0 - 32.0 mmol/L 30.3  Urea Nitrogen (BUN) 7 - 25 mg/dL 22  Creatinine 0.7 - 1.3 mg/dL 1.1  Glomerular Filtration Rate (eGFR) >60 mL/min/1.73sq m 67  Comment: CKD-EPI (2021) does not include  patient's race in the calculation of eGFR.  Monitoring changes of plasma creatinine and eGFR over time is useful for monitoring kidney function.  Interpretive Ranges for eGFR (CKD-EPI 2021):  eGFR:       >60 mL/min/1.73 sq. m - Normal eGFR:       30-59 mL/min/1.73 sq. m - Moderately Decreased eGFR:       15-29 mL/min/1.73 sq. m  - Severely Decreased eGFR:       < 15 mL/min/1.73 sq. m  - Kidney Failure   Note: These eGFR calculations do not apply in acute situations when eGFR is changing rapidly or patients on dialysis.  Calcium 8.7 - 10.3 mg/dL 9.6  AST 8 - 39 U/L 23  ALT 6 - 57 U/L 12  Alk Phos (alkaline Phosphatase) 34 - 104 U/L 207 High   Albumin 3.5 - 4.8 g/dL 4.6  Bilirubin, Total 0.3 - 1.2 mg/dL 1.1  Protein, Total 6.1 - 7.9 g/dL 7.7  A/G Ratio 1.0 - 5.0 gm/dL 1.5  Resulting Agency [facility]   Specimen Collected: 02/14/23 11:34   Performed by: [facility] Last Resulted: 02/14/23 13:41  Received From: [facility]  Result Received: 02/15/23 09:35   CBC w/auto Differential (  5 Part) Order: [id number] Component Ref Range & Units 6 d ago  WBC (White Blood Cell Count) 4.1 - 10.2 10^3/uL 7.6  RBC (Red Blood Cell Count) 4.69 - 6.13 10^6/uL 4.49 Low   Hemoglobin 14.1 - 18.1 gm/dL 14 Low   Hematocrit 40.9 - 52.0 % 41.7  MCV (Mean Corpuscular Volume) 80.0 - 100.0 fl 92.9  MCH (Mean Corpuscular Hemoglobin) 27.0 - 31.2 pg 31.2  MCHC (Mean Corpuscular Hemoglobin Concentration) 32.0 - 36.0 gm/dL 81.1  Platelet Count 914 - 450 10^3/uL 224  RDW-CV (Red Cell Distribution Width) 11.6 - 14.8 % 13.7  MPV (Mean Platelet Volume) 9.4 - 12.4 fl 9.9  Neutrophils 1.50 - 7.80 10^3/uL 5.45  Lymphocytes 1.00 - 3.60 10^3/uL 1.41  Monocytes 0.00 - 1.50 10^3/uL 0.61  Eosinophils 0.00 - 0.55 10^3/uL 0.1  Basophils 0.00 - 0.09 10^3/uL 0.05  Neutrophil % 32.0 - 70.0 % 71.2 High   Lymphocyte % 10.0 - 50.0 % 18.5  Monocyte % 4.0  - 13.0 % 8  Eosinophil % 1.0 - 5.0 % 1.3  Basophil% 0.0 - 2.0 % 0.7  Immature Granulocyte % <=0.7 % 0.3  Immature Granulocyte Count <=0.06 10^3/L 0.02  Resulting Agency [facility]   Specimen Collected: 02/14/23 11:34   Performed by: [facility] Last Resulted: 02/14/23 11:55  Received From: [facility]  Result Received: 02/15/23 09:35   Urinalysis w/Microscopic Order: [id number] Component Ref Range & Units 6 d ago  Color Colorless, Straw, Light Yellow, Yellow, Dark Yellow Colorless  Clarity Clear Clear  Specific Gravity 1.005 - 1.030 1.006  pH, Urine 5.0 - 8.0 5  Protein, Urinalysis Negative mg/dL Negative  Glucose, Urinalysis Negative mg/dL Negative  Ketones, Urinalysis Negative mg/dL Negative  Blood, Urinalysis Negative Negative  Nitrite, Urinalysis Negative Negative  Leukocyte Esterase, Urinalysis Negative 2+ Abnormal   Bilirubin, Urinalysis Negative Negative  Urobilinogen, Urinalysis 0.2 - 1.0 mg/dL 0.2  WBC, UA <=5 /hpf 44 High   Red Blood Cells, Urinalysis <=3 /hpf 1  Bacteria, Urinalysis 0 - 5 /hpf 0-5  Squamous Epithelial Cells, Urinalysis /hpf 0  Resulting Agency [facility]   Specimen Collected: 02/14/23 11:34   Performed by: [facility] Last Resulted: 02/14/23 17:14  Received From: [facility]  Result Received: 02/15/23 09:35   Urinalysis See HPI and EPIC  I have reviewed the labs.   Pertinent Imaging:  02/21/23 09:43  Scan Result    Assessment & Plan:    1. Pelvic pain -UA w/ pyuria -Urine sent for culture and atypical's -PVR demonstrates adequate emptying -I will go ahead and send him to [facility] for his urodynamic study as he does not want to return to [facility] urology  2.  L4 vertebral fracture -would need to consider the fact that his pelvic pain is actually a manifestation of the lumbar degenerative joint disease  Return  for follow up pending urine culture results .  These notes generated with voice recognition software. I apologize for typographical errors.  Cloretta Ned  [facility] [address]  [address] [place], [place] [address] [phone]

## 2023-02-21 ENCOUNTER — Encounter: Payer: Self-pay | Admitting: Urology

## 2023-02-21 ENCOUNTER — Ambulatory Visit (INDEPENDENT_AMBULATORY_CARE_PROVIDER_SITE_OTHER): Payer: Medicare HMO | Admitting: Urology

## 2023-02-21 VITALS — BP 131/64 | HR 67 | Ht 74.0 in | Wt 162.3 lb

## 2023-02-21 DIAGNOSIS — R102 Pelvic and perineal pain: Secondary | ICD-10-CM | POA: Diagnosis not present

## 2023-02-21 DIAGNOSIS — Z8781 Personal history of (healed) traumatic fracture: Secondary | ICD-10-CM

## 2023-02-21 DIAGNOSIS — R8281 Pyuria: Secondary | ICD-10-CM

## 2023-02-21 LAB — URINALYSIS, COMPLETE
Bilirubin, UA: NEGATIVE
Glucose, UA: NEGATIVE
Ketones, UA: NEGATIVE
Nitrite, UA: NEGATIVE
Protein,UA: NEGATIVE
RBC, UA: NEGATIVE
Specific Gravity, UA: 1.015 (ref 1.005–1.030)
Urobilinogen, Ur: 0.2 mg/dL (ref 0.2–1.0)
pH, UA: 5.5 (ref 5.0–7.5)

## 2023-02-21 LAB — BLADDER SCAN AMB NON-IMAGING

## 2023-02-21 LAB — MICROSCOPIC EXAMINATION

## 2023-02-24 LAB — CULTURE, URINE COMPREHENSIVE

## 2023-02-27 ENCOUNTER — Ambulatory Visit: Payer: Medicare HMO | Admitting: Cardiovascular Disease

## 2023-03-06 ENCOUNTER — Ambulatory Visit: Payer: Medicare HMO | Admitting: Cardiology

## 2023-03-17 ENCOUNTER — Ambulatory Visit: Payer: Medicare HMO | Admitting: Cardiovascular Disease

## 2023-03-17 ENCOUNTER — Encounter: Payer: Self-pay | Admitting: Cardiovascular Disease

## 2023-03-17 VITALS — BP 130/68 | HR 71 | Ht 74.0 in | Wt 164.0 lb

## 2023-03-17 DIAGNOSIS — I480 Paroxysmal atrial fibrillation: Secondary | ICD-10-CM | POA: Diagnosis not present

## 2023-03-17 DIAGNOSIS — I5033 Acute on chronic diastolic (congestive) heart failure: Secondary | ICD-10-CM | POA: Diagnosis not present

## 2023-03-17 DIAGNOSIS — I10 Essential (primary) hypertension: Secondary | ICD-10-CM

## 2023-03-17 DIAGNOSIS — R0789 Other chest pain: Secondary | ICD-10-CM

## 2023-03-17 DIAGNOSIS — I4891 Unspecified atrial fibrillation: Secondary | ICD-10-CM

## 2023-03-17 MED ORDER — PANTOPRAZOLE SODIUM 40 MG PO TBEC: 40.0000 mg | DELAYED_RELEASE_TABLET | Freq: Two times a day (BID) | ORAL | 1 refills | Status: DC

## 2023-03-17 NOTE — Progress Notes (Signed)
 Cardiology Office Note   Date:  03/17/2023   ID:  [name] Browning, DOB 1940/11/22, MRN [id number]  PCP:  Margaretann Loveless, MD  Cardiologist:  Adrian Blackwater, MD      History of Present Illness: [name] Browning is a 83 y.o. male who presents for  Chief Complaint  Patient presents with   Follow-up    3 month follow up     Chest Pain  This is a recurrent problem. The current episode started 1 to 4 weeks ago. The problem occurs 2 to 4 times per day. The pain is present in the substernal region. The pain is at a severity of 4/10. The quality of the pain is described as dull. Pertinent negatives include no shortness of breath.      Past Medical History:  Diagnosis Date   Anginal pain (HCC)    Anxiety    Aortic atherosclerosis (HCC)    Atrial fibrillation (HCC) 01/2019   Bilateral carpal tunnel syndrome    CAD (coronary artery disease)    a.) LHC 2004 --> normal coronaries. b.) normal stress test in 2007 and 2011; c.) Lexiscan 05/20/2014 --> LVEF 55-65%; no significant stress induced ischemia/arrythmia. d.) CT chest 03/25/2019 --> coronaries carcified.   Carotid atherosclerosis, bilateral    Carpal tunnel syndrome, bilateral    CHF (congestive heart failure) (HCC)    Chronic anticoagulation    a.) ASA + apixaban   COPD (chronic obstructive pulmonary disease) (HCC)    CVA (cerebral vascular accident) (HCC)    Degenerative disc disease, cervical    Diastolic dysfunction    a.) TTE 05/29/2014 --> LVEF 60-65%; G1DD.   DVT (deep venous thrombosis) (HCC)    GERD (gastroesophageal reflux disease)    History of 2019 novel coronavirus disease (COVID-19) 02/08/2019   HLD (hyperlipidemia)    Hypertension    Kidney stones    Osteoarthritis of right shoulder    Pneumonia    Respiratory failure, acute (HCC) 09/20/2020   a.) severe respiratory distress 1 hour after urological surgery. CXR (+) for acute pulmonary edema. Transferred to ICU and placed on NIPPV. Questionable aspiration PNA. (+)  A.fib with RVR. Improved with ABX, diuresis, and amiodarone.   Skin cancer of face    a.) RIGHT ear and RIGHT forehead; excised.   Syncope    TIA (transient ischemic attack) 2016   Valvular regurgitation    a.) TTE 05/26/2014 --> LVEF 60-65%; trivial MR, mild TR; no AR or PR. b.) TTE 04/20/2015 --> LVEF 55-60%; trivial MR and PR; no AR or TR.     Past Surgical History:  Procedure Laterality Date   CARDIAC CATHETERIZATION  2004   CARPAL TUNNEL RELEASE Right 06/11/2013   CENTRAL LINE INSERTION  11/14/2020   Procedure: CENTRAL LINE INSERTION;  Surgeon: Marykay Lex, MD;  Location: [facility] INVASIVE CV LAB;  Service: Cardiovascular;;   CORONARY/GRAFT ACUTE MI REVASCULARIZATION N/A 11/14/2020   Procedure: Coronary/Graft Acute MI Revascularization;  Surgeon: Marykay Lex, MD;  Location: [facility] INVASIVE CV LAB;  Service: Cardiovascular;  Laterality: N/A;   CYSTOSCOPY W/ RETROGRADES  10/16/2020   Procedure: CYSTOSCOPY WITH RETROGRADE PYELOGRAM;  Surgeon: Sondra Come, MD;  Location: [facility] ORS;  Service: Urology;;   CYSTOSCOPY W/ URETERAL STENT PLACEMENT Left 09/20/2020   Procedure: CYSTOSCOPY WITH RETROGRADE PYELOGRAM/URETERAL STENT PLACEMENT;  Surgeon: Jannifer Hick, MD;  Location: [facility] ORS;  Service: Urology;  Laterality: Left;   CYSTOSCOPY/URETEROSCOPY/HOLMIUM LASER/STENT PLACEMENT Left 10/16/2020   Procedure: CYSTOSCOPY/URETEROSCOPY/HOLMIUM LASER/STENT PLACEMENT;  Surgeon: Sondra Come, MD;  Location: [facility] ORS;  Service: Urology;  Laterality: Left;   ESOPHAGOGASTRODUODENOSCOPY N/A 11/06/2020   Procedure: ESOPHAGOGASTRODUODENOSCOPY (EGD);  Surgeon: Midge Minium, MD;  Location: [facility] ENDOSCOPY;  Service: Endoscopy;  Laterality: N/A;   LEFT HEART CATH AND CORONARY ANGIOGRAPHY N/A 11/14/2020   Procedure: LEFT HEART CATH AND CORONARY ANGIOGRAPHY;  Surgeon: Marykay Lex, MD;  Location: [facility] INVASIVE CV LAB;  Service: Cardiovascular;  Laterality: N/A;   LEFT HEART CATH AND CORONARY ANGIOGRAPHY  Left 01/28/2022   Procedure: LEFT HEART CATH AND CORONARY ANGIOGRAPHY;  Surgeon: Laurier Nancy, MD;  Location: [facility] INVASIVE CV LAB;  Service: Cardiovascular;  Laterality: Left;   SHOULDER ARTHROSCOPY WITH OPEN ROTATOR CUFF REPAIR Right 08/24/2017   Procedure: SHOULDER ARTHROSCOPY WITH OPEN ROTATOR CUFF REPAIR;  Surgeon: Christena Flake, MD;  Location: [facility] ORS;  Service: Orthopedics;  Laterality: Right;   SKIN CANCER EXCISION  12/01/2016   right ear    SKIN CANCER EXCISION     remove from the right side of the face    THROMBECTOMY Right 2004   leg   THYROIDECTOMY  1950   Not sure if total or partial thyroidectomy.     Current Outpatient Medications  Medication Sig Dispense Refill   acetaminophen (TYLENOL) 500 MG tablet Take 2 tablets (1,000 mg total) by mouth every 6 (six) hours as needed for mild pain.     aspirin EC 81 MG tablet Take 81 mg by mouth daily after supper.     atorvastatin (LIPITOR) 80 MG tablet Take 1 tablet (80 mg total) by mouth daily. 90 tablet 3   Budeson-Glycopyrrol-Formoterol (BREZTRI AEROSPHERE) 160-9-4.8 MCG/ACT AERO Inhale 2 puffs into the lungs in the morning and at bedtime. 10.7 g 12   cetirizine (ZYRTEC) 10 MG tablet Take 10 mg by mouth daily.     clopidogrel (PLAVIX) 75 MG tablet TAKE 1 TABLET BY MOUTH EVERY DAY 90 tablet 3   DULoxetine (CYMBALTA) 30 MG capsule TAKE 1 CAPSULE BY MOUTH EVERY DAY 90 capsule 3   Ensifentrine (OHTUVAYRE) 3 MG/2.5ML SUSP Inhale 2.5 mLs into the lungs 2 (two) times daily.     famotidine (PEPCID) 20 MG tablet TAKE 1 TABLET BY MOUTH AFTER SUPPER 90 tablet 3   ferrous gluconate (FERGON) 240 (27 FE) MG tablet Take 240 mg by mouth 3 (three) times daily with meals.     finasteride (PROSCAR) 5 MG tablet Take 1 tablet (5 mg total) by mouth daily. 90 tablet 3   hyoscyamine (ANASPAZ) 0.125 MG TBDP disintergrating tablet Place 1 tablet (0.125 mg total) under the tongue every 6 (six) hours as needed. 120 tablet 0   isosorbide mononitrate (IMDUR)  30 MG 24 hr tablet TAKE 1 TABLET BY MOUTH 2 TIMES DAILY. 180 tablet 3   ketorolac (ACULAR) 0.5 % ophthalmic solution Place 1 drop into the left eye 4 (four) times daily.     KLOR-CON M20 20 MEQ tablet TAKE 1 TABLET BY MOUTH EVERY DAY 90 tablet 3   losartan (COZAAR) 50 MG tablet TAKE 1 TABLET BY MOUTH EVERY DAY 90 tablet 1   Meth-Hyo-M Bl-Na Phos-Ph Sal (URIBEL) 118 MG CAPS Take 1 capsule (118 mg total) by mouth every 6 (six) hours as needed. 30 capsule 0   methenamine (HIPREX) 1 g tablet Take 1 tablet (1 g total) by mouth 2 (two) times daily with a meal. 60 tablet 3   metoprolol succinate (TOPROL XL) 25 MG 24 hr tablet Take 1 tablet (25 mg  total) by mouth daily. 30 tablet 11   montelukast (SINGULAIR) 10 MG tablet Take 10 mg by mouth at bedtime.     moxifloxacin (VIGAMOX) 0.5 % ophthalmic solution      Nebulizers (VIOS LC PLUS) MISC      ondansetron (ZOFRAN-ODT) 4 MG disintegrating tablet Take 1 tablet (4 mg total) by mouth every 6 (six) hours as needed for nausea or vomiting. 20 tablet 0   pantoprazole (PROTONIX) 40 MG tablet Take 1 tablet (40 mg total) by mouth 2 (two) times daily. 180 tablet 1   prednisoLONE acetate (PRED FORTE) 1 % ophthalmic suspension INSTILL 1 DROP INTO RIGHT EYE FOUR TIMES A DAY AS DIRECTED PLEASE SHAKE WELL BEFORE EACH USE!!     ranolazine (RANEXA) 1000 MG SR tablet TAKE 1 TABLET BY MOUTH TWICE A DAY 180 tablet 1   roflumilast (DALIRESP) 500 MCG TABS tablet Take 500 mcg by mouth daily.     sulfacetamide (BLEPH-10) 10 % ophthalmic solution SMARTSIG:In Eye(s)     tamsulosin (FLOMAX) 0.4 MG CAPS capsule Take 1 capsule (0.4 mg total) by mouth daily. 30 capsule 11   torsemide (DEMADEX) 20 MG tablet TAKE 3 TABLETS BY MOUTH EVERY DAY 270 tablet 3   No current facility-administered medications for this visit.    Allergies:   Hydromorphone    Social History:   reports that he quit smoking about 20 years ago. His smoking use included cigarettes. He started smoking about 66  years ago. He has a 46.1 pack-year smoking history. He has been exposed to tobacco smoke. He quit smokeless tobacco use about 20 years ago.  His smokeless tobacco use included snuff. He reports that he does not currently use alcohol after a past usage of about 7.0 standard drinks of alcohol per week. He reports that he does not use drugs.   Family History:  family history includes CAD in his father; Heart attack in his father; Heart disease in his mother; Hypertension in his mother.    ROS:     Review of Systems  Constitutional: Negative.   HENT: Negative.    Eyes: Negative.   Respiratory: Negative.  Negative for shortness of breath.   Cardiovascular:  Positive for chest pain.  Gastrointestinal: Negative.   Genitourinary: Negative.   Musculoskeletal: Negative.   Skin: Negative.   Neurological: Negative.   Endo/Heme/Allergies: Negative.   Psychiatric/Behavioral: Negative.    All other systems reviewed and are negative.     All other systems are reviewed and negative.    PHYSICAL EXAM: VS:  BP 130/68   Pulse 71   Ht 6\' 2"  (1.88 m)   Wt 164 lb (74.4 kg)   SpO2 90%   BMI 21.06 kg/m  , BMI Body mass index is 21.06 kg/m. Last weight:  Wt Readings from Last 3 Encounters:  03/17/23 164 lb (74.4 kg)  02/21/23 162 lb 4.8 oz (73.6 kg)  02/09/23 162 lb 12.8 oz (73.8 kg)     Physical Exam Vitals reviewed.  Constitutional:      Appearance: Normal appearance. He is normal weight.  HENT:     Head: Normocephalic.     Nose: Nose normal.     Mouth/Throat:     Mouth: Mucous membranes are moist.  Eyes:     Pupils: Pupils are equal, round, and reactive to light.  Cardiovascular:     Rate and Rhythm: Normal rate and regular rhythm.     Pulses: Normal pulses.     Heart sounds: Normal heart  sounds.  Pulmonary:     Effort: Pulmonary effort is normal.  Abdominal:     General: Abdomen is flat. Bowel sounds are normal.  Musculoskeletal:        General: Normal range of motion.      Cervical back: Normal range of motion.  Skin:    General: Skin is warm.  Neurological:     General: No focal deficit present.     Mental Status: He is alert.  Psychiatric:        Mood and Affect: Mood normal.       EKG:   Recent Labs: 11/21/2022: ALT 12; BUN 24; Creatinine, Ser 1.33; Hemoglobin 13.4; Platelets 195; Potassium 4.6; Sodium 142    Lipid Panel    Component Value Date/Time   CHOL 283 (H) 11/21/2022 1030   TRIG 98 11/21/2022 1030   HDL 45 11/21/2022 1030   CHOLHDL 3.7 11/12/2020 0435   VLDL 9 11/12/2020 0435   LDLCALC 221 (H) 11/21/2022 1030   LDLCALC 105 (H) 12/07/2016 5409      Other studies Reviewed: Additional studies/ records that were reviewed today include:  Review of the above records demonstrates:       No data to display            ASSESSMENT AND PLAN:    ICD-10-CM   1. Essential hypertension, benign  I10 pantoprazole (PROTONIX) 40 MG tablet    2. New onset atrial fibrillation (HCC)  I48.91 pantoprazole (PROTONIX) 40 MG tablet    3. AF (paroxysmal atrial fibrillation) (HCC)  I48.0 pantoprazole (PROTONIX) 40 MG tablet    4. Acute on chronic diastolic CHF (congestive heart failure) (HCC)  I50.33 pantoprazole (PROTONIX) 40 MG tablet    5. Other chest pain  R07.89 pantoprazole (PROTONIX) 40 MG tablet   stress test in 10/24 normal.may have GERD, wiull give protonix       Problem List Items Addressed This Visit       Cardiovascular and Mediastinum   Essential hypertension, benign - Primary   Relevant Medications   pantoprazole (PROTONIX) 40 MG tablet   New onset atrial fibrillation (HCC)   Relevant Medications   pantoprazole (PROTONIX) 40 MG tablet   AF (paroxysmal atrial fibrillation) (HCC)   Relevant Medications   pantoprazole (PROTONIX) 40 MG tablet   Acute on chronic diastolic CHF (congestive heart failure) (HCC)   Relevant Medications   pantoprazole (PROTONIX) 40 MG tablet     Other   Chest pain   Relevant Medications    pantoprazole (PROTONIX) 40 MG tablet       Disposition:   Return in about 3 months (around 06/17/2023).    Total time spent: 30 minutes  Signed,  Adrian Blackwater, MD  03/17/2023 2:31 PM    [facility]

## 2023-04-15 ENCOUNTER — Other Ambulatory Visit: Payer: Self-pay

## 2023-04-15 ENCOUNTER — Emergency Department
Admission: EM | Admit: 2023-04-15 | Discharge: 2023-04-15 | Disposition: A | Discharge status: Home / Self Care | Attending: Emergency Medicine | Admitting: Emergency Medicine

## 2023-04-15 ENCOUNTER — Emergency Department

## 2023-04-15 DIAGNOSIS — M85842 Other specified disorders of bone density and structure, left hand: Secondary | ICD-10-CM | POA: Diagnosis not present

## 2023-04-15 DIAGNOSIS — I110 Hypertensive heart disease with heart failure: Secondary | ICD-10-CM | POA: Diagnosis not present

## 2023-04-15 DIAGNOSIS — M7989 Other specified soft tissue disorders: Secondary | ICD-10-CM | POA: Diagnosis not present

## 2023-04-15 DIAGNOSIS — I509 Heart failure, unspecified: Secondary | ICD-10-CM | POA: Insufficient documentation

## 2023-04-15 DIAGNOSIS — S62631A Displaced fracture of distal phalanx of left index finger, initial encounter for closed fracture: Secondary | ICD-10-CM | POA: Diagnosis not present

## 2023-04-15 DIAGNOSIS — L03012 Cellulitis of left finger: Secondary | ICD-10-CM | POA: Diagnosis not present

## 2023-04-15 DIAGNOSIS — W228XXA Striking against or struck by other objects, initial encounter: Secondary | ICD-10-CM | POA: Diagnosis not present

## 2023-04-15 DIAGNOSIS — S62661A Nondisplaced fracture of distal phalanx of left index finger, initial encounter for closed fracture: Secondary | ICD-10-CM | POA: Insufficient documentation

## 2023-04-15 DIAGNOSIS — S6992XA Unspecified injury of left wrist, hand and finger(s), initial encounter: Secondary | ICD-10-CM | POA: Diagnosis present

## 2023-04-15 DIAGNOSIS — J449 Chronic obstructive pulmonary disease, unspecified: Secondary | ICD-10-CM | POA: Diagnosis not present

## 2023-04-15 LAB — CBC WITH DIFFERENTIAL/PLATELET
Abs Immature Granulocytes: 0.03 10*3/uL (ref 0.00–0.07)
Basophils Absolute: 0.1 10*3/uL (ref 0.0–0.1)
Basophils Relative: 1 %
Eosinophils Absolute: 0.2 10*3/uL (ref 0.0–0.5)
Eosinophils Relative: 2 %
HCT: 38.2 % — ABNORMAL LOW (ref 39.0–52.0)
Hemoglobin: 12.7 g/dL — ABNORMAL LOW (ref 13.0–17.0)
Immature Granulocytes: 0 %
Lymphocytes Relative: 16 %
Lymphs Abs: 1.3 10*3/uL (ref 0.7–4.0)
MCH: 30.4 pg (ref 26.0–34.0)
MCHC: 33.2 g/dL (ref 30.0–36.0)
MCV: 91.4 fL (ref 80.0–100.0)
Monocytes Absolute: 0.7 10*3/uL (ref 0.1–1.0)
Monocytes Relative: 8 %
Neutro Abs: 6 10*3/uL (ref 1.7–7.7)
Neutrophils Relative %: 73 %
Platelets: 215 10*3/uL (ref 150–400)
RBC: 4.18 MIL/uL — ABNORMAL LOW (ref 4.22–5.81)
RDW: 12.9 % (ref 11.5–15.5)
WBC: 8.3 10*3/uL (ref 4.0–10.5)
nRBC: 0 % (ref 0.0–0.2)

## 2023-04-15 LAB — COMPREHENSIVE METABOLIC PANEL WITH GFR
ALT: 11 U/L (ref 0–44)
AST: 24 U/L (ref 15–41)
Albumin: 3.9 g/dL (ref 3.5–5.0)
Alkaline Phosphatase: 139 U/L — ABNORMAL HIGH (ref 38–126)
Anion gap: 11 (ref 5–15)
BUN: 26 mg/dL — ABNORMAL HIGH (ref 8–23)
CO2: 24 mmol/L (ref 22–32)
Calcium: 8.9 mg/dL (ref 8.9–10.3)
Chloride: 102 mmol/L (ref 98–111)
Creatinine, Ser: 1.35 mg/dL — ABNORMAL HIGH (ref 0.61–1.24)
GFR, Estimated: 53 mL/min — ABNORMAL LOW (ref >60)
Glucose, Bld: 100 mg/dL — ABNORMAL HIGH (ref 70–99)
Potassium: 3.9 mmol/L (ref 3.5–5.1)
Sodium: 137 mmol/L (ref 135–145)
Total Bilirubin: 1.1 mg/dL (ref 0.0–1.2)
Total Protein: 7.8 g/dL (ref 6.5–8.1)

## 2023-04-15 LAB — LACTIC ACID, PLASMA: Lactic Acid, Venous: 0.9 mmol/L (ref 0.5–1.9)

## 2023-04-15 MED ORDER — DOXYCYCLINE HYCLATE 100 MG PO TABS
100.0000 mg | ORAL_TABLET | Freq: Once | ORAL | Status: AC
  Administered 2023-04-15: 100 mg via ORAL
  Filled 2023-04-15: qty 1

## 2023-04-15 MED ORDER — DOXYCYCLINE HYCLATE 100 MG PO CAPS: 100.0000 mg | ORAL_CAPSULE | Freq: Two times a day (BID) | ORAL | 0 refills | Status: AC

## 2023-04-15 NOTE — Discharge Instructions (Addendum)
 The x-ray of your finger shows that you have fractured the bone in your fingertip.  Please wear the splint at all times until cleared by orthopedics.  Take the antibiotics as prescribed.  Schedule follow-up appointment with Dr. Ernest Pine for Monday.  Call the office to set this up.  Information is attached.  You can take Tylenol and ibuprofen as needed for pain.

## 2023-04-15 NOTE — ED Provider Notes (Signed)
 [facility] Provider Note    Event Date/Time   First MD Initiated Contact with Patient 04/15/23 1823     (approximate)   History   Hand Pain   HPI  [name] Browning is a 83 y.o. male with PMH of COPD, hypertension, A-fib, CHF, CVA who presents for evaluation of left index finger pain for 3 weeks.  Patient thinks that he may have smashed it with a hammer but does not remember.      Physical Exam   Triage Vital Signs: ED Triage Vitals [04/15/23 1637]  Encounter Vitals Group     BP (!) 139/90     Systolic BP Percentile      Diastolic BP Percentile      Pulse Rate 66     Resp 17     Temp 97.8 F (36.6 C)     Temp Source Oral     SpO2 94 %     Weight      Height      Head Circumference      Peak Flow      Pain Score 7     Pain Loc      Pain Education      Exclude from Growth Chart     Most recent vital signs: Vitals:   04/15/23 1637  BP: (!) 139/90  Pulse: 66  Resp: 17  Temp: 97.8 F (36.6 C)  SpO2: 94%   General: Awake, no distress.  CV:  Good peripheral perfusion.  Resp:  Normal effort. Abd:  No distention.  Other:  Left index finger tip is erythematous, swollen and warm, tender to palpation, fingernail and nailbed is black and appears a bit elevated, capillary refill intact and sensation well-maintained, patient unable to fully flex the finger due to the swelling, radial pulse 2+ and regular.  See media portion of the chart.   ED Results / Procedures / Treatments   Labs (all labs ordered are listed, but only abnormal results are displayed) Labs Reviewed  CBC WITH DIFFERENTIAL/PLATELET - Abnormal; Notable for the following components:      Result Value   RBC 4.18 (*)    Hemoglobin 12.7 (*)    HCT 38.2 (*)    All other components within normal limits  COMPREHENSIVE METABOLIC PANEL WITH GFR - Abnormal; Notable for the following components:   Glucose, Bld 100 (*)    BUN 26 (*)    Creatinine, Ser 1.35 (*)    Alkaline  Phosphatase 139 (*)    GFR, Estimated 53 (*)    All other components within normal limits  LACTIC ACID, PLASMA    RADIOLOGY  Left index finger x-ray obtained, interpreted the images as well as reviewed the radiologist report which shows minimally displaced tiny fractures of the tuft of the distal phalanx of the index finger.   PROCEDURES:  Critical Care performed: No  .Splint Application  Date/Time: 04/15/2023 7:39 PM  Performed by: Cameron Ali, PA-C Authorized by: Cameron Ali, PA-C   Consent:    Consent obtained:  Verbal   Consent given by:  Patient   Risks, benefits, and alternatives were discussed: yes     Risks discussed:  Pain   Alternatives discussed:  No treatment Universal protocol:    Imaging studies available: yes     Patient identity confirmed:  Verbally with patient Pre-procedure details:    Distal neurologic exam:  Normal   Distal perfusion: brisk capillary refill   Procedure details:  Location:  Finger   Finger location:  L index finger   Cast type:  Finger   Splint type:  Finger   Supplies:  Aluminum splint and elastic bandage Post-procedure details:    Distal neurologic exam:  Normal   Distal perfusion: brisk capillary refill     Procedure completion:  Tolerated   Post-procedure imaging: not applicable      MEDICATIONS ORDERED IN ED: Medications  doxycycline (VIBRA-TABS) tablet 100 mg (has no administration in time range)     IMPRESSION / MDM / ASSESSMENT AND PLAN / ED COURSE  I reviewed the triage vital signs and the nursing notes.                             83 year old male presents for evaluation of left index finger pain.  Blood pressure is elevated otherwise vital signs are stable.  Patient NAD on exam.  Differential diagnosis includes, but is not limited to, osteomyelitis, fracture, felon, flexor tenosynovitis, cellulitis.  Patient's presentation is most consistent with acute complicated illness / injury requiring  diagnostic workup.  CBC shows mild anemia, CMP notable for elevated BUN and creatinine.  Lactic acid WNL.  X-ray of the finger shows minimally displaced tiny fractures of the tuft of the distal phalanx.  Suspect that patient did in fact smashed it with a hammer and then it became infected afterwards.  Will place patient in a splint and on antibiotics.  Recommended follow-up with orthopedics in 2 days.  Patient voiced understanding, all questions were answered and he was stable at discharge.     FINAL CLINICAL IMPRESSION(S) / ED DIAGNOSES   Final diagnoses:  Nondisplaced fracture of distal phalanx of left index finger, initial encounter for closed fracture  Cellulitis of finger of left hand     Rx / DC Orders   ED Discharge Orders          Ordered    doxycycline (VIBRAMYCIN) 100 MG capsule  2 times daily        04/15/23 1935             Note:  This document was prepared using Dragon voice recognition software and may include unintentional dictation errors.   Cameron Ali, PA-C 04/15/23 1942    Janith Lima, MD 04/16/23 [phone]

## 2023-04-15 NOTE — ED Triage Notes (Signed)
 Pt c/o left index finger pain x3 weeks. Left 2nd finger is red, swollen, nailbed is black, and fingernail looks like it's detaching. Pt states he may have injured it while hammering but is unsure. Tenderness to palpation, cap refill <2 seconds.

## 2023-04-17 DIAGNOSIS — S62639A Displaced fracture of distal phalanx of unspecified finger, initial encounter for closed fracture: Secondary | ICD-10-CM | POA: Diagnosis not present

## 2023-04-17 DIAGNOSIS — S6010XA Contusion of unspecified finger with damage to nail, initial encounter: Secondary | ICD-10-CM | POA: Diagnosis not present

## 2023-04-18 ENCOUNTER — Other Ambulatory Visit: Payer: Self-pay

## 2023-04-18 DIAGNOSIS — E782 Mixed hyperlipidemia: Secondary | ICD-10-CM

## 2023-04-18 DIAGNOSIS — I25110 Atherosclerotic heart disease of native coronary artery with unstable angina pectoris: Secondary | ICD-10-CM

## 2023-04-20 MED ORDER — ATORVASTATIN CALCIUM 80 MG PO TABS: 80.0000 mg | ORAL_TABLET | Freq: Every day | ORAL | 3 refills | Status: AC

## 2023-04-24 ENCOUNTER — Ambulatory Visit: Payer: Medicare HMO | Admitting: Internal Medicine

## 2023-04-24 DIAGNOSIS — S62639A Displaced fracture of distal phalanx of unspecified finger, initial encounter for closed fracture: Secondary | ICD-10-CM | POA: Diagnosis not present

## 2023-04-24 DIAGNOSIS — S6010XA Contusion of unspecified finger with damage to nail, initial encounter: Secondary | ICD-10-CM | POA: Diagnosis not present

## 2023-04-30 ENCOUNTER — Other Ambulatory Visit: Payer: Self-pay | Admitting: Cardiovascular Disease

## 2023-05-11 DIAGNOSIS — S62639A Displaced fracture of distal phalanx of unspecified finger, initial encounter for closed fracture: Secondary | ICD-10-CM | POA: Diagnosis not present

## 2023-05-11 DIAGNOSIS — S6010XA Contusion of unspecified finger with damage to nail, initial encounter: Secondary | ICD-10-CM | POA: Diagnosis not present

## 2023-05-15 ENCOUNTER — Observation Stay
Admission: EM | Admit: 2023-05-15 | Discharge: 2023-05-19 | Disposition: A | Discharge status: Home / Self Care | Attending: Internal Medicine | Admitting: Internal Medicine

## 2023-05-15 ENCOUNTER — Emergency Department

## 2023-05-15 DIAGNOSIS — M47892 Other spondylosis, cervical region: Secondary | ICD-10-CM | POA: Insufficient documentation

## 2023-05-15 DIAGNOSIS — I4891 Unspecified atrial fibrillation: Secondary | ICD-10-CM | POA: Diagnosis present

## 2023-05-15 DIAGNOSIS — I110 Hypertensive heart disease with heart failure: Secondary | ICD-10-CM | POA: Insufficient documentation

## 2023-05-15 DIAGNOSIS — S32511A Fracture of superior rim of right pubis, initial encounter for closed fracture: Principal | ICD-10-CM

## 2023-05-15 DIAGNOSIS — R112 Nausea with vomiting, unspecified: Secondary | ICD-10-CM | POA: Insufficient documentation

## 2023-05-15 DIAGNOSIS — J449 Chronic obstructive pulmonary disease, unspecified: Secondary | ICD-10-CM | POA: Diagnosis present

## 2023-05-15 DIAGNOSIS — I10 Essential (primary) hypertension: Secondary | ICD-10-CM | POA: Diagnosis not present

## 2023-05-15 DIAGNOSIS — J42 Unspecified chronic bronchitis: Secondary | ICD-10-CM | POA: Diagnosis not present

## 2023-05-15 DIAGNOSIS — R079 Chest pain, unspecified: Secondary | ICD-10-CM | POA: Diagnosis not present

## 2023-05-15 DIAGNOSIS — S32591A Other specified fracture of right pubis, initial encounter for closed fracture: Secondary | ICD-10-CM

## 2023-05-15 DIAGNOSIS — I5032 Chronic diastolic (congestive) heart failure: Secondary | ICD-10-CM | POA: Diagnosis present

## 2023-05-15 DIAGNOSIS — S0990XA Unspecified injury of head, initial encounter: Secondary | ICD-10-CM | POA: Diagnosis not present

## 2023-05-15 DIAGNOSIS — W1830XA Fall on same level, unspecified, initial encounter: Secondary | ICD-10-CM | POA: Diagnosis not present

## 2023-05-15 DIAGNOSIS — M25551 Pain in right hip: Secondary | ICD-10-CM | POA: Diagnosis not present

## 2023-05-15 DIAGNOSIS — S2242XA Multiple fractures of ribs, left side, initial encounter for closed fracture: Secondary | ICD-10-CM | POA: Diagnosis not present

## 2023-05-15 DIAGNOSIS — N3001 Acute cystitis with hematuria: Secondary | ICD-10-CM | POA: Diagnosis not present

## 2023-05-15 DIAGNOSIS — R918 Other nonspecific abnormal finding of lung field: Secondary | ICD-10-CM | POA: Diagnosis not present

## 2023-05-15 DIAGNOSIS — M25512 Pain in left shoulder: Secondary | ICD-10-CM | POA: Diagnosis not present

## 2023-05-15 DIAGNOSIS — R8281 Pyuria: Secondary | ICD-10-CM | POA: Insufficient documentation

## 2023-05-15 DIAGNOSIS — S199XXA Unspecified injury of neck, initial encounter: Secondary | ICD-10-CM | POA: Diagnosis not present

## 2023-05-15 DIAGNOSIS — M2578 Osteophyte, vertebrae: Secondary | ICD-10-CM | POA: Insufficient documentation

## 2023-05-15 DIAGNOSIS — R29898 Other symptoms and signs involving the musculoskeletal system: Secondary | ICD-10-CM | POA: Diagnosis not present

## 2023-05-15 DIAGNOSIS — M19012 Primary osteoarthritis, left shoulder: Secondary | ICD-10-CM | POA: Diagnosis not present

## 2023-05-15 DIAGNOSIS — J4489 Other specified chronic obstructive pulmonary disease: Secondary | ICD-10-CM

## 2023-05-15 DIAGNOSIS — I2510 Atherosclerotic heart disease of native coronary artery without angina pectoris: Secondary | ICD-10-CM | POA: Diagnosis not present

## 2023-05-15 DIAGNOSIS — S32592A Other specified fracture of left pubis, initial encounter for closed fracture: Secondary | ICD-10-CM | POA: Diagnosis not present

## 2023-05-15 DIAGNOSIS — Z7982 Long term (current) use of aspirin: Secondary | ICD-10-CM | POA: Diagnosis not present

## 2023-05-15 DIAGNOSIS — Z515 Encounter for palliative care: Secondary | ICD-10-CM | POA: Insufficient documentation

## 2023-05-15 DIAGNOSIS — S32599A Other specified fracture of unspecified pubis, initial encounter for closed fracture: Secondary | ICD-10-CM

## 2023-05-15 DIAGNOSIS — I4811 Longstanding persistent atrial fibrillation: Secondary | ICD-10-CM | POA: Diagnosis not present

## 2023-05-15 DIAGNOSIS — I517 Cardiomegaly: Secondary | ICD-10-CM | POA: Diagnosis not present

## 2023-05-15 DIAGNOSIS — W19XXXA Unspecified fall, initial encounter: Secondary | ICD-10-CM | POA: Diagnosis not present

## 2023-05-15 DIAGNOSIS — M1611 Unilateral primary osteoarthritis, right hip: Secondary | ICD-10-CM | POA: Diagnosis not present

## 2023-05-15 LAB — COMPREHENSIVE METABOLIC PANEL WITH GFR
ALT: 15 U/L (ref 0–44)
AST: 26 U/L (ref 15–41)
Albumin: 4 g/dL (ref 3.5–5.0)
Alkaline Phosphatase: 142 U/L — ABNORMAL HIGH (ref 38–126)
Anion gap: 11 (ref 5–15)
BUN: 24 mg/dL — ABNORMAL HIGH (ref 8–23)
CO2: 27 mmol/L (ref 22–32)
Calcium: 9.2 mg/dL (ref 8.9–10.3)
Chloride: 98 mmol/L (ref 98–111)
Creatinine, Ser: 1.28 mg/dL — ABNORMAL HIGH (ref 0.61–1.24)
GFR, Estimated: 56 mL/min — ABNORMAL LOW (ref >60)
Glucose, Bld: 119 mg/dL — ABNORMAL HIGH (ref 70–99)
Potassium: 3.9 mmol/L (ref 3.5–5.1)
Sodium: 136 mmol/L (ref 135–145)
Total Bilirubin: 1.4 mg/dL — ABNORMAL HIGH (ref 0.0–1.2)
Total Protein: 7.6 g/dL (ref 6.5–8.1)

## 2023-05-15 LAB — CBC WITH DIFFERENTIAL/PLATELET
Abs Immature Granulocytes: 0.05 10*3/uL (ref 0.00–0.07)
Basophils Absolute: 0 10*3/uL (ref 0.0–0.1)
Basophils Relative: 0 %
Eosinophils Absolute: 0 10*3/uL (ref 0.0–0.5)
Eosinophils Relative: 0 %
HCT: 39.3 % (ref 39.0–52.0)
Hemoglobin: 12.7 g/dL — ABNORMAL LOW (ref 13.0–17.0)
Immature Granulocytes: 1 %
Lymphocytes Relative: 9 %
Lymphs Abs: 0.9 10*3/uL (ref 0.7–4.0)
MCH: 30.4 pg (ref 26.0–34.0)
MCHC: 32.3 g/dL (ref 30.0–36.0)
MCV: 94 fL (ref 80.0–100.0)
Monocytes Absolute: 0.8 10*3/uL (ref 0.1–1.0)
Monocytes Relative: 7 %
Neutro Abs: 8.7 10*3/uL — ABNORMAL HIGH (ref 1.7–7.7)
Neutrophils Relative %: 83 %
Platelets: 184 10*3/uL (ref 150–400)
RBC: 4.18 MIL/uL — ABNORMAL LOW (ref 4.22–5.81)
RDW: 13.5 % (ref 11.5–15.5)
WBC: 10.5 10*3/uL (ref 4.0–10.5)
nRBC: 0 % (ref 0.0–0.2)

## 2023-05-15 LAB — URINALYSIS, ROUTINE W REFLEX MICROSCOPIC
Bilirubin Urine: NEGATIVE
Glucose, UA: NEGATIVE mg/dL
Ketones, ur: NEGATIVE mg/dL
Nitrite: NEGATIVE
Protein, ur: NEGATIVE mg/dL
Specific Gravity, Urine: 1.009 (ref 1.005–1.030)
WBC, UA: >50 WBC/hpf (ref 0–5)
pH: 5 (ref 5.0–8.0)

## 2023-05-15 LAB — TROPONIN I (HIGH SENSITIVITY): Troponin I (High Sensitivity): 32 ng/L — ABNORMAL HIGH (ref <18)

## 2023-05-15 MED ORDER — ONDANSETRON HCL 4 MG/2ML IJ SOLN
4.0000 mg | Freq: Four times a day (QID) | INTRAMUSCULAR | Status: DC | PRN
  Administered 2023-05-15 – 2023-05-18 (×4): 4 mg via INTRAVENOUS
  Filled 2023-05-15 (×4): qty 2

## 2023-05-15 MED ORDER — SODIUM CHLORIDE 0.9 % IV SOLN
1.0000 g | Freq: Two times a day (BID) | INTRAVENOUS | Status: DC
  Filled 2023-05-15 (×3): qty 20

## 2023-05-15 MED ORDER — ASPIRIN 81 MG PO TBEC
81.0000 mg | DELAYED_RELEASE_TABLET | Freq: Every day | ORAL | Status: DC
  Administered 2023-05-16 – 2023-05-18 (×3): 81 mg via ORAL
  Filled 2023-05-15 (×3): qty 1

## 2023-05-15 MED ORDER — LOSARTAN POTASSIUM 50 MG PO TABS
50.0000 mg | ORAL_TABLET | Freq: Every day | ORAL | Status: DC
  Filled 2023-05-15: qty 1

## 2023-05-15 MED ORDER — SODIUM CHLORIDE 0.9 % IV BOLUS
500.0000 mL | Freq: Once | INTRAVENOUS | Status: AC
  Administered 2023-05-15: 500 mL via INTRAVENOUS

## 2023-05-15 MED ORDER — POLYETHYLENE GLYCOL 3350 17 G PO PACK: 17.0000 g | PACK | Freq: Every day | ORAL | Status: DC | PRN

## 2023-05-15 MED ORDER — TORSEMIDE 20 MG PO TABS
60.0000 mg | ORAL_TABLET | Freq: Every day | ORAL | Status: DC
Start: 2023-05-16 — End: 2023-05-18
  Administered 2023-05-17: 60 mg via ORAL
  Filled 2023-05-15 (×3): qty 3

## 2023-05-15 MED ORDER — HYDROMORPHONE HCL 1 MG/ML IJ SOLN
0.5000 mg | INTRAMUSCULAR | Status: DC | PRN
  Administered 2023-05-15: 0.5 mg via INTRAVENOUS
  Filled 2023-05-15: qty 0.5

## 2023-05-15 MED ORDER — SODIUM CHLORIDE 0.9% FLUSH
3.0000 mL | Freq: Two times a day (BID) | INTRAVENOUS | Status: DC
  Administered 2023-05-15 – 2023-05-19 (×7): 3 mL via INTRAVENOUS

## 2023-05-15 MED ORDER — RANOLAZINE ER 500 MG PO TB12
1000.0000 mg | ORAL_TABLET | Freq: Two times a day (BID) | ORAL | Status: DC
  Administered 2023-05-16 – 2023-05-17 (×2): 1000 mg via ORAL
  Filled 2023-05-15 (×7): qty 2

## 2023-05-15 MED ORDER — IPRATROPIUM-ALBUTEROL 0.5-2.5 (3) MG/3ML IN SOLN: 3.0000 mL | RESPIRATORY_TRACT | Status: DC | PRN

## 2023-05-15 MED ORDER — SODIUM CHLORIDE 0.9 % IV SOLN
1.0000 g | INTRAVENOUS | Status: DC
  Administered 2023-05-15: 1 g via INTRAVENOUS
  Filled 2023-05-15: qty 10

## 2023-05-15 MED ORDER — ONDANSETRON HCL 4 MG PO TABS
4.0000 mg | ORAL_TABLET | Freq: Four times a day (QID) | ORAL | Status: DC | PRN
  Administered 2023-05-16: 4 mg via ORAL
  Filled 2023-05-15: qty 1

## 2023-05-15 MED ORDER — ACETAMINOPHEN 325 MG PO TABS
650.0000 mg | ORAL_TABLET | Freq: Once | ORAL | Status: AC
  Administered 2023-05-15: 650 mg via ORAL
  Filled 2023-05-15: qty 2

## 2023-05-15 MED ORDER — FAMOTIDINE 20 MG PO TABS
20.0000 mg | ORAL_TABLET | Freq: Every day | ORAL | Status: DC
  Administered 2023-05-16 – 2023-05-18 (×3): 20 mg via ORAL
  Filled 2023-05-15 (×4): qty 1

## 2023-05-15 MED ORDER — ENSIFENTRINE 3 MG/2.5ML IN SUSP: 2.5000 mL | Freq: Two times a day (BID) | RESPIRATORY_TRACT | Status: DC

## 2023-05-15 MED ORDER — ISOSORBIDE MONONITRATE ER 30 MG PO TB24
30.0000 mg | ORAL_TABLET | Freq: Two times a day (BID) | ORAL | Status: DC
Start: 2023-05-16 — End: 2023-05-19
  Administered 2023-05-16: 30 mg via ORAL
  Filled 2023-05-15 (×3): qty 1

## 2023-05-15 MED ORDER — METOPROLOL SUCCINATE ER 25 MG PO TB24
25.0000 mg | ORAL_TABLET | Freq: Every day | ORAL | Status: DC
  Filled 2023-05-15: qty 1

## 2023-05-15 MED ORDER — DULOXETINE HCL 30 MG PO CPEP
30.0000 mg | ORAL_CAPSULE | Freq: Every day | ORAL | Status: DC
  Administered 2023-05-17: 30 mg via ORAL
  Filled 2023-05-15 (×4): qty 1

## 2023-05-15 MED ORDER — ATORVASTATIN CALCIUM 80 MG PO TABS
80.0000 mg | ORAL_TABLET | Freq: Every day | ORAL | Status: DC
  Administered 2023-05-16 – 2023-05-19 (×3): 80 mg via ORAL
  Filled 2023-05-15 (×4): qty 1

## 2023-05-15 MED ORDER — CLOPIDOGREL BISULFATE 75 MG PO TABS
75.0000 mg | ORAL_TABLET | Freq: Every day | ORAL | Status: DC
  Administered 2023-05-16 – 2023-05-19 (×3): 75 mg via ORAL
  Filled 2023-05-15 (×4): qty 1

## 2023-05-15 MED ORDER — OXYCODONE HCL 5 MG PO TABS
5.0000 mg | ORAL_TABLET | Freq: Four times a day (QID) | ORAL | Status: DC | PRN
  Administered 2023-05-15 – 2023-05-16 (×2): 10 mg via ORAL
  Administered 2023-05-17: 5 mg via ORAL
  Filled 2023-05-15: qty 1
  Filled 2023-05-15 (×2): qty 2

## 2023-05-15 MED ORDER — ACETAMINOPHEN 500 MG PO TABS
1000.0000 mg | ORAL_TABLET | Freq: Three times a day (TID) | ORAL | Status: DC
  Administered 2023-05-15 – 2023-05-19 (×9): 1000 mg via ORAL
  Filled 2023-05-15 (×10): qty 2

## 2023-05-15 MED ORDER — FINASTERIDE 5 MG PO TABS
5.0000 mg | ORAL_TABLET | Freq: Every day | ORAL | Status: DC
  Administered 2023-05-16 – 2023-05-17 (×2): 5 mg via ORAL
  Filled 2023-05-15 (×4): qty 1

## 2023-05-15 NOTE — Progress Notes (Signed)
 Called regarding R superior and inferior pubic rami fractures. Patient may WBAT on RLE. Per ED staff, patient unable to ambulate currently. Recommend admission to Hospitalist team for PT/OT, pain control, and possible placement. No plan for surgical intervention.

## 2023-05-15 NOTE — Assessment & Plan Note (Signed)
No shortness of breath reported at this time.  - Continue home regimen

## 2023-05-15 NOTE — ED Triage Notes (Signed)
 Tripped over some cords outside and fell. Patient able to walk after fall. No LOC.  C/O right hip pain. No deformity. VS wnl

## 2023-05-15 NOTE — ED Notes (Signed)
 Pt in CT.

## 2023-05-15 NOTE — Assessment & Plan Note (Signed)
 Patient is presenting with a mechanical ground-level fall, complicated by pubic rami fractures with intractable pain.  - PT/OT

## 2023-05-15 NOTE — Assessment & Plan Note (Signed)
 Chest pain reported, however likely due to fall with overlying ecchymosis at site of pain  - Continue home regimen

## 2023-05-15 NOTE — Assessment & Plan Note (Addendum)
 Patient endorsing dysuria, however he is adamant it is unchanged for the last 2 years. Multiple rounds of antibiotics by Urology without improvement; recently referred for urodynamic testing  - Hold off on further antibiotics given no acute symptoms

## 2023-05-15 NOTE — Assessment & Plan Note (Signed)
-   Resume home regimen

## 2023-05-15 NOTE — Assessment & Plan Note (Signed)
 X-ray with acute mildly displaced fractures of the right superior and inferior pubic rami after a mechanical ground-level fall.  Orthopedic surgery has been consulted and recommending weightbearing as tolerated.  - Pain control with Tylenol , oxycodone  and Dilaudid - PT/OT

## 2023-05-15 NOTE — H&P (Addendum)
 History and Physical    Patient: [name] Browning OZH:[id number] DOB: [birth year] DOA: 05/15/2023 DOS: the patient was seen and examined on 05/15/2023 PCP: [name] Hove, MD  Patient coming from: Home  Chief Complaint: No chief complaint on file.  HPI: [name] Browning is a 83 y.o. male with medical history significant of atrial fibrillation not on anticoagulation, CAD on DAPT, HFpEF, severe COPD with chronic hypoxic respiratory failure no longer on supplemental oxygen , TIA, Hypertension, hyperlipidemia, recurrent UTI with multidrug-resistant E. coli BPH with LUTS, who presents to the ED due to ground-level fall.  [name] Browning states that he was working in his garden when he tripped on a garden hose, landing onto his right side.  He is endorsing significant right pelvis/hip pain with inability to bear weight. He also endorses left shoulder pain and left-sided chest pain but this is less severe.  He denies any acute symptoms prior to the fall, including chest pain, palpitations, dizziness, shortness of breath.  Otherwise, [name] Browning states he has been experiencing dysuria for 2 years with no improvement or change after multiple rounds of antibiotics.  He denies any acute abdominal pain.  ED course: On arrival to the ED, patient was normotensive at 118/86 with heart rate of 71.  He was saturating at 98% on room air.  He was afebrile at 97.8.  Initial workup notable for hemoglobin of 12.7, creatinine 1.28, and GFR 56.  Urinalysis with large leukocytes, elevated WBCs/hpf and rare bacteria.  CT head and C-spine with no acute findings.  Left shoulder x-ray with no acute findings.  Hip x-ray with slightly displaced right superior and inferior pubic rami fractures.  Orthopedic surgery consulted and recommending weightbearing as tolerated.  TRH consulted for admission due to uncontrollable pain.  Review of Systems: As mentioned in the history of present illness. All other systems reviewed and are negative.  Past  Medical History:  Diagnosis Date   Anginal pain (HCC)    Anxiety    Aortic atherosclerosis (HCC)    Atrial fibrillation (HCC) 01/2019   Bilateral carpal tunnel syndrome    CAD (coronary artery disease)    a.) LHC 2004 --> normal coronaries. b.) normal stress test in 2007 and 2011; c.) Lexiscan 05/20/2014 --> LVEF 55-65%; no significant stress induced ischemia/arrythmia. d.) CT chest 03/25/2019 --> coronaries carcified.   Carotid atherosclerosis, bilateral    Carpal tunnel syndrome, bilateral    CHF (congestive heart failure) (HCC)    Chronic anticoagulation    a.) ASA + apixaban    COPD (chronic obstructive pulmonary disease) (HCC)    CVA (cerebral vascular accident) (HCC)    Degenerative disc disease, cervical    Diastolic dysfunction    a.) TTE 05/29/2014 --> LVEF 60-65%; G1DD.   DVT (deep venous thrombosis) (HCC)    GERD (gastroesophageal reflux disease)    History of 2019 novel coronavirus disease (COVID-19) 02/08/2019   HLD (hyperlipidemia)    Hypertension    Kidney stones    Osteoarthritis of right shoulder    Pneumonia    Respiratory failure, acute (HCC) 09/20/2020   a.) severe respiratory distress 1 hour after urological surgery. CXR (+) for acute pulmonary edema. Transferred to ICU and placed on NIPPV. Questionable aspiration PNA. (+) A.fib with RVR. Improved with ABX, diuresis, and amiodarone .   Skin cancer of face    a.) RIGHT ear and RIGHT forehead; excised.   Syncope    TIA (transient ischemic attack) 2016   Valvular regurgitation    a.) TTE 05/26/2014 -->  LVEF 60-65%; trivial MR, mild TR; no AR or PR. b.) TTE 04/20/2015 --> LVEF 55-60%; trivial MR and PR; no AR or TR.   Past Surgical History:  Procedure Laterality Date   CARDIAC CATHETERIZATION  2004   CARPAL TUNNEL RELEASE Right 06/11/2013   CENTRAL LINE INSERTION  11/14/2020   Procedure: CENTRAL LINE INSERTION;  Surgeon: Arleen Lacer, MD;  Location: [facility] INVASIVE CV LAB;  Service: Cardiovascular;;    CORONARY/GRAFT ACUTE MI REVASCULARIZATION N/A 11/14/2020   Procedure: Coronary/Graft Acute MI Revascularization;  Surgeon: Arleen Lacer, MD;  Location: [facility] INVASIVE CV LAB;  Service: Cardiovascular;  Laterality: N/A;   CYSTOSCOPY W/ RETROGRADES  10/16/2020   Procedure: CYSTOSCOPY WITH RETROGRADE PYELOGRAM;  Surgeon: Lawerence Pressman, MD;  Location: [facility] ORS;  Service: Urology;;   CYSTOSCOPY W/ URETERAL STENT PLACEMENT Left 09/20/2020   Procedure: CYSTOSCOPY WITH RETROGRADE PYELOGRAM/URETERAL STENT PLACEMENT;  Surgeon: Lahoma Pigg, MD;  Location: [facility] ORS;  Service: Urology;  Laterality: Left;   CYSTOSCOPY/URETEROSCOPY/HOLMIUM LASER/STENT PLACEMENT Left 10/16/2020   Procedure: CYSTOSCOPY/URETEROSCOPY/HOLMIUM LASER/STENT PLACEMENT;  Surgeon: Lawerence Pressman, MD;  Location: [facility] ORS;  Service: Urology;  Laterality: Left;   ESOPHAGOGASTRODUODENOSCOPY N/A 11/06/2020   Procedure: ESOPHAGOGASTRODUODENOSCOPY (EGD);  Surgeon: Marnee Sink, MD;  Location: [facility] ENDOSCOPY;  Service: Endoscopy;  Laterality: N/A;   LEFT HEART CATH AND CORONARY ANGIOGRAPHY N/A 11/14/2020   Procedure: LEFT HEART CATH AND CORONARY ANGIOGRAPHY;  Surgeon: Arleen Lacer, MD;  Location: [facility] INVASIVE CV LAB;  Service: Cardiovascular;  Laterality: N/A;   LEFT HEART CATH AND CORONARY ANGIOGRAPHY Left 01/28/2022   Procedure: LEFT HEART CATH AND CORONARY ANGIOGRAPHY;  Surgeon: Cherrie Cornwall, MD;  Location: [facility] INVASIVE CV LAB;  Service: Cardiovascular;  Laterality: Left;   SHOULDER ARTHROSCOPY WITH OPEN ROTATOR CUFF REPAIR Right 08/24/2017   Procedure: SHOULDER ARTHROSCOPY WITH OPEN ROTATOR CUFF REPAIR;  Surgeon: Elner Hahn, MD;  Location: [facility] ORS;  Service: Orthopedics;  Laterality: Right;   SKIN CANCER EXCISION  12/01/2016   right ear    SKIN CANCER EXCISION     remove from the right side of the face    THROMBECTOMY Right 2004   leg   THYROIDECTOMY  1950   Not sure if total or partial thyroidectomy.   Social History:   reports that he quit smoking about 20 years ago. His smoking use included cigarettes. He started smoking about 66 years ago. He has a 46.1 pack-year smoking history. He has been exposed to tobacco smoke. He quit smokeless tobacco use about 20 years ago.  His smokeless tobacco use included snuff. He reports that he does not currently use alcohol after a past usage of about 7.0 standard drinks of alcohol per week. He reports that he does not use drugs.  Allergies  Allergen Reactions   Hydromorphone     Hallucination Pt reports he doesn't know about this    Family History  Problem Relation Age of Onset   Hypertension Mother    Heart disease Mother    CAD Father    Heart attack Father     Prior to Admission medications   Medication Sig Start Date End Date Taking? Authorizing Provider  acetaminophen  (TYLENOL ) 500 MG tablet Take 2 tablets (1,000 mg total) by mouth every 6 (six) hours as needed for mild pain. 05/07/20   Emmalene Hare, MD  aspirin  EC 81 MG tablet Take 81 mg by mouth daily after supper.    [provider]  atorvastatin  (LIPITOR) 80 MG tablet Take  1 tablet (80 mg total) by mouth daily. 04/20/23   [name] Hove, MD  Budeson-Glycopyrrol-Formoterol  (BREZTRI  AEROSPHERE) 160-9-4.8 MCG/ACT AERO Inhale 2 puffs into the lungs in the morning and at bedtime. 10/10/22   Cobb, Mariah Shines, NP  cetirizine (ZYRTEC) 10 MG tablet Take 10 mg by mouth daily. 09/29/20   [provider]  clopidogrel  (PLAVIX ) 75 MG tablet TAKE 1 TABLET BY MOUTH EVERY DAY 07/11/22   Shawnee Dellen A, FNP  DULoxetine  (CYMBALTA ) 30 MG capsule TAKE 1 CAPSULE BY MOUTH EVERY DAY 12/27/22   [name] Hove, MD  Ensifentrine  (OHTUVAYRE ) 3 MG/2.5ML SUSP Inhale 2.5 mLs into the lungs 2 (two) times daily. 11/30/22   Kasa, Kurian, MD  famotidine  (PEPCID ) 20 MG tablet TAKE 1 TABLET BY MOUTH AFTER SUPPER 09/20/21   Kasa, Kurian, MD  ferrous gluconate  (FERGON) 240 (27 FE) MG tablet Take 240 mg by mouth 3 (three) times daily  with meals.    [provider]  finasteride  (PROSCAR ) 5 MG tablet Take 1 tablet (5 mg total) by mouth daily. 08/16/22   Matilde Son A, PA-C  hyoscyamine  (ANASPAZ ) 0.125 MG TBDP disintergrating tablet Place 1 tablet (0.125 mg total) under the tongue every 6 (six) hours as needed. 08/25/22   Matilde Son A, PA-C  isosorbide  mononitrate (IMDUR ) 30 MG 24 hr tablet TAKE 1 TABLET BY MOUTH 2 TIMES DAILY. 01/26/23   Cherrie Cornwall, MD  ketorolac  (ACULAR ) 0.5 % ophthalmic solution Place 1 drop into the left eye 4 (four) times daily. 11/24/22   [provider]  KLOR-CON  M20 20 MEQ tablet TAKE 1 TABLET BY MOUTH EVERY DAY 09/15/22   Shawnee Dellen A, FNP  losartan  (COZAAR ) 50 MG tablet TAKE 1 TABLET BY MOUTH EVERY DAY 05/02/23   Cherrie Cornwall, MD  Meth-Hyo-M Aurea Blossom Phos-Ph Sal (URIBEL ) 118 MG CAPS Take 1 capsule (118 mg total) by mouth every 6 (six) hours as needed. 08/23/22   Matilde Son A, PA-C  methenamine  (HIPREX ) 1 g tablet Take 1 tablet (1 g total) by mouth 2 (two) times daily with a meal. 02/02/22   Lawerence Pressman, MD  metoprolol  succinate (TOPROL  XL) 25 MG 24 hr tablet Take 1 tablet (25 mg total) by mouth daily. 10/31/22 10/31/23  Cherrie Cornwall, MD  montelukast  (SINGULAIR ) 10 MG tablet Take 10 mg by mouth at bedtime.    [provider]  moxifloxacin (VIGAMOX) 0.5 % ophthalmic solution  09/27/22   [provider]  Nebulizers (VIOS LC PLUS) MISC  12/16/22   [provider]  ondansetron  (ZOFRAN -ODT) 4 MG disintegrating tablet Take 1 tablet (4 mg total) by mouth every 6 (six) hours as needed for nausea or vomiting. 08/07/21   Ward, Clover Dao, DO  pantoprazole  (PROTONIX ) 40 MG tablet Take 1 tablet (40 mg total) by mouth 2 (two) times daily. 03/17/23   Cherrie Cornwall, MD  prednisoLONE acetate (PRED FORTE) 1 % ophthalmic suspension INSTILL 1 DROP INTO RIGHT EYE FOUR TIMES A DAY AS DIRECTED PLEASE SHAKE WELL BEFORE EACH USE!! 09/27/22   [provider]   ranolazine  (RANEXA ) 1000 MG SR tablet TAKE 1 TABLET BY MOUTH TWICE A DAY 01/05/23   Cherrie Cornwall, MD  roflumilast  (DALIRESP ) 500 MCG TABS tablet Take 500 mcg by mouth daily. 04/03/22   [provider]  sulfacetamide (BLEPH-10) 10 % ophthalmic solution SMARTSIG:In Eye(s) 08/03/22   [provider]  tamsulosin  (FLOMAX ) 0.4 MG CAPS capsule Take 1 capsule (0.4 mg total) by mouth daily.  02/02/22   Lawerence Pressman, MD  torsemide  (DEMADEX ) 20 MG tablet TAKE 3 TABLETS BY MOUTH EVERY DAY 07/11/22   Charlette Console, FNP    Physical Exam: Vitals:   05/15/23 1346 05/15/23 1740  BP: 118/86 124/71  Pulse: 71 (!) 49  Resp: 17 18  Temp: 97.8 F (36.6 C) 97.8 F (36.6 C)  TempSrc: Oral   SpO2: 98% 98%   Physical Exam Vitals and nursing note reviewed.  Constitutional:      General: He is not in acute distress.    Appearance: He is normal weight. He is not toxic-appearing.  HENT:     Head: Normocephalic and atraumatic.     Mouth/Throat:     Pharynx: Oropharynx is clear.  Eyes:     Conjunctiva/sclera: Conjunctivae normal.     Pupils: Pupils are equal, round, and reactive to light.  Cardiovascular:     Rate and Rhythm: Normal rate and regular rhythm.     Heart sounds: No murmur heard. Pulmonary:     Effort: Pulmonary effort is normal. No respiratory distress.     Breath sounds: Normal breath sounds. No wheezing, rhonchi or rales.  Chest:     Comments:  Left-sided chest tenderness to palpation with no crepitus Abdominal:     General: Bowel sounds are normal. There is no distension.     Palpations: Abdomen is soft.     Tenderness: There is no abdominal tenderness.  Musculoskeletal:     Comments:  Significant pain with hip flexion  Skin:    General: Skin is warm and dry.     Comments:  Small bruises on the left side of the chest  Neurological:     Mental Status: He is alert and oriented to person, place, and time. Mental status is at baseline.  Psychiatric:         Mood and Affect: Mood normal.        Behavior: Behavior normal.    Data Reviewed: CBC with WBC of 10.5, hemoglobin of 12.7, platelets of 184 CMP with sodium of 136, potassium 3.9, bicarb 27, glucose 119, BUN 24, creatinine 1.28, AST 26, ALT 15, GFR 56 Urinalysis with small hematuria, large leukocytes, rare bacteria, and elevated WBCs/hpf  EKG personally reviewed.  Atrial fibrillation given no identifiable P waves.  Rate of 59.  DG Chest 2 View Result Date: 05/15/2023 CLINICAL DATA:  Chest pain EXAM: CHEST - 2 VIEW COMPARISON:  Chest x-ray 07/03/2022 FINDINGS: The heart is mildly enlarged, unchanged. There is chronic. Mild diffuse interstitial prominence. There is no evidence for central pulmonary vascular congestion. There are minimal strandy and patchy opacities in the right lung base favored as scarring or atelectasis. There is no pleural effusion or pneumothorax. No acute fractures are seen. IMPRESSION: 1. Minimal strandy and patchy opacities in the right lung base favored as scarring or atelectasis. 2. Mild cardiomegaly. Electronically Signed   By: Tyron Gallon M.D.   On: 05/15/2023 19:02   DG Hip Unilat With Pelvis 2-3 Views Right Result Date: 05/15/2023 CLINICAL DATA:  Fall outside today.  Right hip pain. EXAM: DG HIP (WITH OR WITHOUT PELVIS) 2-3V RIGHT COMPARISON:  None Available. FINDINGS: Acute mildly displaced fractures of the right superior and inferior pubic rami approaching the pubic symphysis. No pubic symphyseal widening. No additional fracture of the hip. Femoral head is well seated. Mild right hip degenerative change with joint space narrowing and spurring. No evidence of erosion or avascular necrosis. The sacroiliac joints are congruent. IMPRESSION:  1. Acute mildly displaced fractures of the right superior and inferior pubic rami approaching the pubic symphysis. 2. Mild right hip osteoarthritis. Electronically Signed   By: Chadwick Colonel M.D.   On: 05/15/2023 17:56   DG Shoulder  Left Result Date: 05/15/2023 CLINICAL DATA:  Fall outside today.  Left shoulder pain. EXAM: LEFT SHOULDER - 2+ VIEW COMPARISON:  None Available. FINDINGS: There is no evidence of fracture or dislocation. Mild acromioclavicular degenerative change. No erosions or suspicious bone lesion. Soft tissues are unremarkable. Remote healed fractures of left ribs, no evidence of acute rib fracture. IMPRESSION: 1. No fracture or dislocation of the left shoulder. 2. Mild acromioclavicular degenerative change. Electronically Signed   By: Chadwick Colonel M.D.   On: 05/15/2023 17:55   CT Head Wo Contrast Result Date: 05/15/2023 CLINICAL DATA:  Head trauma, trip and fall EXAM: CT HEAD WITHOUT CONTRAST CT CERVICAL SPINE WITHOUT CONTRAST TECHNIQUE: Multidetector CT imaging of the head and cervical spine was performed following the standard protocol without intravenous contrast. Multiplanar CT image reconstructions of the cervical spine were also generated. RADIATION DOSE REDUCTION: This exam was performed according to the departmental dose-optimization program which includes automated exposure control, adjustment of the mA and/or kV according to patient size and/or use of iterative reconstruction technique. COMPARISON:  11/20/2010 FINDINGS: CT HEAD FINDINGS Brain: No evidence of acute infarction, hemorrhage, hydrocephalus, extra-axial collection or mass lesion/mass effect. Vascular: No hyperdense vessel or unexpected calcification. Skull: Normal. Negative for fracture or focal lesion. Sinuses/Orbits: No acute finding. Other: None. CT CERVICAL SPINE FINDINGS Alignment: Normal. Skull base and vertebrae: No acute fracture. No primary bone lesion or focal pathologic process. Soft tissues and spinal canal: No prevertebral fluid or swelling. No visible canal hematoma. Disc levels: Focally moderate disc space height loss and osteophytosis of C5-C6 with otherwise mild disc degenerative change of the cervical spine. Upper chest: Negative.  Other: None. IMPRESSION: 1. No acute intracranial pathology. 2. No fracture or static subluxation of the cervical spine. 3. Focally moderate disc space height loss and osteophytosis of C5-C6 with otherwise mild disc degenerative change of the cervical spine. Electronically Signed   By: Fredricka Jenny M.D.   On: 05/15/2023 17:17   CT Cervical Spine Wo Contrast Result Date: 05/15/2023 CLINICAL DATA:  Head trauma, trip and fall EXAM: CT HEAD WITHOUT CONTRAST CT CERVICAL SPINE WITHOUT CONTRAST TECHNIQUE: Multidetector CT imaging of the head and cervical spine was performed following the standard protocol without intravenous contrast. Multiplanar CT image reconstructions of the cervical spine were also generated. RADIATION DOSE REDUCTION: This exam was performed according to the departmental dose-optimization program which includes automated exposure control, adjustment of the mA and/or kV according to patient size and/or use of iterative reconstruction technique. COMPARISON:  11/20/2010 FINDINGS: CT HEAD FINDINGS Brain: No evidence of acute infarction, hemorrhage, hydrocephalus, extra-axial collection or mass lesion/mass effect. Vascular: No hyperdense vessel or unexpected calcification. Skull: Normal. Negative for fracture or focal lesion. Sinuses/Orbits: No acute finding. Other: None. CT CERVICAL SPINE FINDINGS Alignment: Normal. Skull base and vertebrae: No acute fracture. No primary bone lesion or focal pathologic process. Soft tissues and spinal canal: No prevertebral fluid or swelling. No visible canal hematoma. Disc levels: Focally moderate disc space height loss and osteophytosis of C5-C6 with otherwise mild disc degenerative change of the cervical spine. Upper chest: Negative. Other: None. IMPRESSION: 1. No acute intracranial pathology. 2. No fracture or static subluxation of the cervical spine. 3. Focally moderate disc space height loss and osteophytosis of C5-C6 with  otherwise mild disc degenerative change  of the cervical spine. Electronically Signed   By: Fredricka Jenny M.D.   On: 05/15/2023 17:17   Results are pending, will review when available.  Assessment and Plan:  * Ground-level fall Patient is presenting with a mechanical ground-level fall, complicated by pubic rami fractures with intractable pain.  - PT/OT  Fracture of multiple pubic rami (HCC) X-ray with acute mildly displaced fractures of the right superior and inferior pubic rami after a mechanical ground-level fall.  Orthopedic surgery has been consulted and recommending weightbearing as tolerated.  - Pain control with Tylenol , oxycodone  and Dilaudid - PT/OT  Pyuria Patient endorsing dysuria, however he is adamant it is unchanged for the last 2 years. Multiple rounds of antibiotics by Urology without improvement; recently referred for urodynamic testing  - Hold off on further antibiotics given no acute symptoms  Chronic diastolic CHF (congestive heart failure) (HCC) Patient appears euvolemic at this time.  - Continue home regimen  COPD (chronic obstructive pulmonary disease) (HCC) No shortness of breath reported at this time.  - Continue home regimen  Coronary artery disease Chest pain reported, however likely due to fall with overlying ecchymosis at site of pain  - Continue home regimen  Atrial fibrillation [facility]) Patient has a history of atrial fibrillation on metoprolol  for rate control.  He is not on anticoagulation.  - Continue home metoprolol   Essential hypertension, benign - Resume home regimen  Advance Care Planning:   Code Status: Full Code   Consults: Orthopedic surgery  Family Communication: Patient's daughters updated at bedside  Severity of Illness: The appropriate patient status for this patient is OBSERVATION. Observation status is judged to be reasonable and necessary in order to provide the required intensity of service to ensure the patient's safety. The patient's presenting symptoms,  physical exam findings, and initial radiographic and laboratory data in the context of their medical condition is felt to place them at decreased risk for further clinical deterioration. Furthermore, it is anticipated that the patient will be medically stable for discharge from the hospital within 2 midnights of admission.   Author: Avi Body, MD 05/15/2023 7:56 PM  For on call review [email or web address].

## 2023-05-15 NOTE — ED Provider Notes (Signed)
 [facility] Provider Note    Event Date/Time   First MD Initiated Contact with Patient 05/15/23 1612     (approximate)   History   No chief complaint on file.    HPI  [name] Browning is a 83 y.o. male    with a past medical history of nondisplaced fracture of distal phalanx of left index finger, pelvic pain in male, hypertension, COPD, pyuria, S/P coronary artery stent, BPH, chronic diastolic heart failure, recurrent UTI, nephrolithiasis A-fib who presents to the ED complaining right hip pain, unable to walk, left shoulder pain. According to the patient, he was working on his garden and tripping on a hose, with posterior right hip pain, left shoulder pain, chest pain, right rib pain, unable to walk.  Patient denies loss of consciousness, but was feeling dizzy.  Patient  states he is taking blood thinners.  Patient states dysuria, and suprapubic pain.      Physical Exam   Triage Vital Signs: ED Triage Vitals  Encounter Vitals Group     BP 05/15/23 1346 118/86     Systolic BP Percentile --      Diastolic BP Percentile --      Pulse Rate 05/15/23 1346 71     Resp 05/15/23 1346 17     Temp 05/15/23 1346 97.8 F (36.6 C)     Temp Source 05/15/23 1346 Oral     SpO2 05/15/23 1346 98 %     Weight --      Height --      Head Circumference --      Peak Flow --      Pain Score 05/15/23 1344 0     Pain Loc --      Pain Education --      Exclude from Growth Chart --     Most recent vital signs: Vitals:   05/15/23 1346 05/15/23 1740  BP: 118/86 124/71  Pulse: 71 (!) 49  Resp: 17 18  Temp: 97.8 F (36.6 C) 97.8 F (36.6 C)  SpO2: 98% 98%     Constitutional: Alert, NAD. Able to speak in complete sentences without cough or dyspnea  Eyes: Conjunctivae are normal.  Head: Atraumatic. Nose: No congestion/rhinnorhea. Mouth/Throat: Mucous membranes are moist.   Neck: Painless ROM. Supple. No JVD, nodes, thyromegaly  Cardiovascular:   Good peripheral  circulation.RRR no murmurs, gallops, rubs  Respiratory: Normal respiratory effort.  No retractions. Clear to auscultation bilaterally without wheezing or crackles  Gastrointestinal: Soft and nontender.  Musculoskeletal:  Left shoulder: Skin is intact, no ecchymosis or hematomas,  no deformity, but tender to palpation in  acromioclavicular joint.  Full ROM limited by pain, pulses positive, sensation intact. Chest wall: Skin is intact, presence of ecchymosis at the level of the fourth rib with anterior axillary line, ecchymosis at the level of the 10th rib with anterior axillary line, tender to palpation.  Right hip: Skin is intact, no ecchymosis, tender to palpation at the level of the sacroiliac joint,  unable to flex or extend due to pain.  Pulses positive, sensation intact Neurologic:  MAE spontaneously. No gross focal neurologic deficits are appreciated.  Skin:  Skin is warm, dry and intact. No rash noted. Psychiatric: Mood and affect are normal. Speech and behavior are normal.    ED Results / Procedures / Treatments   Labs (all labs ordered are listed, but only abnormal results are displayed) Labs Reviewed  COMPREHENSIVE METABOLIC PANEL WITH GFR - Abnormal; Notable  for the following components:      Result Value   Glucose, Bld 119 (*)    BUN 24 (*)    Creatinine, Ser 1.28 (*)    Alkaline Phosphatase 142 (*)    Total Bilirubin 1.4 (*)    GFR, Estimated 56 (*)    All other components within normal limits  CBC WITH DIFFERENTIAL/PLATELET - Abnormal; Notable for the following components:   RBC 4.18 (*)    Hemoglobin 12.7 (*)    Neutro Abs 8.7 (*)    All other components within normal limits  URINALYSIS, ROUTINE W REFLEX MICROSCOPIC - Abnormal; Notable for the following components:   Color, Urine YELLOW (*)    APPearance CLOUDY (*)    Hgb urine dipstick SMALL (*)    Leukocytes,Ua LARGE (*)    Bacteria, UA RARE (*)    All other components within normal limits  TROPONIN I (HIGH  SENSITIVITY)     EKG See physician read    RADIOLOGY I independently reviewed and interpreted imaging and agree with radiologists findings.      PROCEDURES:  Critical Care performed:   Procedures   MEDICATIONS ORDERED IN ED: Medications  cefTRIAXone  (ROCEPHIN ) 1 g in sodium chloride  0.9 % 100 mL IVPB (1 g Intravenous New Bag/Given 05/15/23 1834)  acetaminophen  (TYLENOL ) tablet 1,000 mg (has no administration in time range)  oxyCODONE  (Oxy IR/ROXICODONE ) immediate release tablet 5-10 mg (has no administration in time range)  HYDROmorphone (DILAUDID) injection 0.5 mg (has no administration in time range)  acetaminophen  (TYLENOL ) tablet 650 mg (650 mg Oral Given 05/15/23 1741)  sodium chloride  0.9 % bolus 500 mL (500 mLs Intravenous New Bag/Given 05/15/23 1833)   Clinical Course as of 05/15/23 1835  Mon May 15, 2023  1746 CT Cervical Spine Wo Contrast  No acute intracranial pathology. 2. No fracture or static subluxation of the cervical spine. 3. Focally moderate disc space height loss and osteophytosis of C5-C6 with otherwise mild disc degenerative change of the cervical spine.   [AE]  1747 CT Head Wo Contrast Brain: No evidence of acute infarction, hemorrhage, hydrocephalus, extra-axial collection or mass lesion/mass effect.  Vascular: No hyperdense vessel or unexpected calcification.  Skull: Normal. Negative for fracture or focal lesion.  Sinuses/Orbits: No acute finding.   [AE]  1802 DG Hip Unilat With Pelvis 2-3 Views Right . Acute mildly displaced fractures of the right superior and inferior pubic rami approaching the pubic symphysis. 2. Mild right hip osteoarthritis.   [AE]  1802 DG Shoulder Left  No fracture or dislocation of the left shoulder. 2. Mild acromioclavicular degenerative change.   [AE]  1802 Urinalysis, Routine w reflex microscopic -Urine, Clean Catch(!) Hemoglobin small amount, cloudy, leukocytes large, bacteria rare [AE]  1803 CBC with  Differential(!) Anemia hemoglobin 12.7, neutrophils elevated 8.7 [AE]  1807 Consulted orthopedics, Dr. Lydia Sams who recommended admission of the patient for pain control PT and OT, future placement. [AE]  1834 Consulted hospitalist patient is going to be admitted [AE]    Clinical Course User Index [AE] Awilda Lennox, PA-C    IMPRESSION / MDM / ASSESSMENT AND PLAN / ED COURSE  I reviewed the triage vital signs and the nursing notes.  Differential diagnosis includes, but is not limited to, intracranial hemorrhage, left shoulder dislocation, right hip fracture, rib fracture, MI, UTI  Patient's presentation is most consistent with acute complicated illness / injury requiring diagnostic workup.  Patient's diagnosis is consistent with acute mildly displaced fractures of the right superior and inferior  pubic rami approaching the pubic symphysis, UTI. I independently reviewed and interpreted imaging and agree with radiologists findings. Labs are  reassuring. I consulted orthopedics, Dr. Lydia Sams who recommended admission of the patient for pain control, OT, PT, and future placement if patient is unable to walk. Discussed plan of care with patient, answered all of patient's questions, Patient agreeable to plan of care.  Patient verbalized understanding.    FINAL CLINICAL IMPRESSION(S) / ED DIAGNOSES   Final diagnoses:  Closed fracture of superior ramus of right pubis, initial encounter (HCC)  Acute cystitis with hematuria     Rx / DC Orders   ED Discharge Orders     None        Note:  This document was prepared using Dragon voice recognition software and may include unintentional dictation errors.   Awilda Lennox, PA-C 05/15/23 1835    Shane Darling, MD 05/15/23 Quin Brush

## 2023-05-15 NOTE — ED Notes (Signed)
 CALLED LAB. TROPONIN TO BE ADDED ON TO PREVIOUS SAMPLE

## 2023-05-15 NOTE — Assessment & Plan Note (Addendum)
 Patient has a history of atrial fibrillation on metoprolol  for rate control.  He is not on anticoagulation.  - Continue home metoprolol 

## 2023-05-15 NOTE — Assessment & Plan Note (Signed)
 Patient appears euvolemic at this time.  - Continue home regimen

## 2023-05-16 ENCOUNTER — Other Ambulatory Visit: Payer: Self-pay

## 2023-05-16 ENCOUNTER — Encounter: Payer: Self-pay | Admitting: Internal Medicine

## 2023-05-16 DIAGNOSIS — W1830XA Fall on same level, unspecified, initial encounter: Secondary | ICD-10-CM | POA: Diagnosis not present

## 2023-05-16 DIAGNOSIS — S32511A Fracture of superior rim of right pubis, initial encounter for closed fracture: Secondary | ICD-10-CM | POA: Diagnosis not present

## 2023-05-16 DIAGNOSIS — I10 Essential (primary) hypertension: Secondary | ICD-10-CM | POA: Diagnosis not present

## 2023-05-16 LAB — CBC
HCT: 33 % — ABNORMAL LOW (ref 39.0–52.0)
Hemoglobin: 10.8 g/dL — ABNORMAL LOW (ref 13.0–17.0)
MCH: 30.7 pg (ref 26.0–34.0)
MCHC: 32.7 g/dL (ref 30.0–36.0)
MCV: 93.8 fL (ref 80.0–100.0)
Platelets: 131 10*3/uL — ABNORMAL LOW (ref 150–400)
RBC: 3.52 MIL/uL — ABNORMAL LOW (ref 4.22–5.81)
RDW: 13.8 % (ref 11.5–15.5)
WBC: 7 10*3/uL (ref 4.0–10.5)
nRBC: 0 % (ref 0.0–0.2)

## 2023-05-16 LAB — BASIC METABOLIC PANEL WITH GFR
Anion gap: 10 (ref 5–15)
BUN: 28 mg/dL — ABNORMAL HIGH (ref 8–23)
CO2: 26 mmol/L (ref 22–32)
Calcium: 8.5 mg/dL — ABNORMAL LOW (ref 8.9–10.3)
Chloride: 98 mmol/L (ref 98–111)
Creatinine, Ser: 1.37 mg/dL — ABNORMAL HIGH (ref 0.61–1.24)
GFR, Estimated: 52 mL/min — ABNORMAL LOW (ref >60)
Glucose, Bld: 104 mg/dL — ABNORMAL HIGH (ref 70–99)
Potassium: 3.6 mmol/L (ref 3.5–5.1)
Sodium: 134 mmol/L — ABNORMAL LOW (ref 135–145)

## 2023-05-16 MED ORDER — ENSURE ENLIVE PO LIQD
237.0000 mL | Freq: Two times a day (BID) | ORAL | Status: DC
  Administered 2023-05-16 – 2023-05-19 (×5): 237 mL via ORAL

## 2023-05-16 MED ORDER — METHENAMINE HIPPURATE 1 G PO TABS: 1.0000 g | ORAL_TABLET | Freq: Two times a day (BID) | ORAL | Status: DC

## 2023-05-16 MED ORDER — BUDESON-GLYCOPYRROL-FORMOTEROL 160-9-4.8 MCG/ACT IN AERO: 2.0000 | INHALATION_SPRAY | Freq: Two times a day (BID) | RESPIRATORY_TRACT | Status: DC

## 2023-05-16 MED ORDER — PANTOPRAZOLE SODIUM 40 MG PO TBEC
40.0000 mg | DELAYED_RELEASE_TABLET | Freq: Two times a day (BID) | ORAL | Status: DC
  Administered 2023-05-16 – 2023-05-18 (×5): 40 mg via ORAL
  Filled 2023-05-16 (×7): qty 1

## 2023-05-16 MED ORDER — ENOXAPARIN SODIUM 40 MG/0.4ML IJ SOSY
40.0000 mg | PREFILLED_SYRINGE | INTRAMUSCULAR | Status: DC
  Administered 2023-05-16 – 2023-05-18 (×3): 40 mg via SUBCUTANEOUS
  Filled 2023-05-16 (×3): qty 0.4

## 2023-05-16 MED ORDER — MORPHINE SULFATE (PF) 2 MG/ML IV SOLN
2.0000 mg | INTRAVENOUS | Status: DC | PRN
  Administered 2023-05-16: 2 mg via INTRAVENOUS
  Filled 2023-05-16: qty 1

## 2023-05-16 MED ORDER — MONTELUKAST SODIUM 10 MG PO TABS: 10.0000 mg | ORAL_TABLET | Freq: Every day | ORAL | Status: DC

## 2023-05-16 MED ORDER — ROFLUMILAST 500 MCG PO TABS: 500.0000 ug | ORAL_TABLET | Freq: Every day | ORAL | Status: DC

## 2023-05-16 MED ORDER — KETOROLAC TROMETHAMINE 0.5 % OP SOLN: 1.0000 [drp] | Freq: Four times a day (QID) | OPHTHALMIC | Status: DC

## 2023-05-16 NOTE — Care Management Obs Status (Signed)
 MEDICARE OBSERVATION STATUS NOTIFICATION   Patient Details  Name: [name] Browning MRN: [id number] Date of Birth: 08/21/1940   Medicare Observation Status Notification Given:  Rudolph Cost, CMA 05/16/2023, 2:35 PM

## 2023-05-16 NOTE — Progress Notes (Signed)
 MEWS Progress Note  Patient Details Name: [name] Browning MRN: [id number] DOB: March 22, 1940 Today's Date: 05/16/2023   MEWS Flowsheet Documentation:  Assess: MEWS Score Temp: (!) 97.5 F (36.4 C) BP: (!) 97/48 MAP (mmHg): (!) 63 Pulse Rate: (!) 44 Resp: 20 Level of Consciousness: Alert SpO2: 97 % O2 Device: Nasal Cannula O2 Flow Rate (L/min): 2 L/min Assess: MEWS Score MEWS Temp: 0 MEWS Systolic: 1 MEWS Pulse: 1 MEWS RR: 0 MEWS LOC: 0 MEWS Score: 2 MEWS Score Color: Yellow Assess: SIRS CRITERIA SIRS Temperature : 0 SIRS Respirations : 0 SIRS Pulse: 0 SIRS WBC: 0 SIRS Score Sum : 0 SIRS Temperature : 0 SIRS Pulse: 0 SIRS Respirations : 0 SIRS WBC: 0 SIRS Score Sum : 0 Assess: if the MEWS score is Yellow or Red Were vital signs accurate and taken at a resting state?: Yes Does the patient meet 2 or more of the SIRS criteria?: No MEWS guidelines implemented : Yes, yellow Treat MEWS Interventions: Considered administering scheduled or prn medications/treatments as ordered Take Vital Signs Increase Vital Sign Frequency : Yellow: Q2hr x1, continue Q4hrs until patient remains green for 12hrs Escalate MEWS: Escalate: Yellow: Discuss with charge nurse and consider notifying provider and/or RRT        Ysidro Her 05/16/2023, 6:56 AM

## 2023-05-16 NOTE — Evaluation (Signed)
 Physical Therapy Evaluation Patient Details Name: [name] Browning MRN: [id number] DOB: [birth year] Today's Date: 05/16/2023  History of Present Illness  presented to ER status post mechanical fall outside (while watering garden) with acute onset of R hip, L shoulder pain; admitted for management of R superior/inferior pubic ramus fracture, WBAT  Clinical Impression  Patient resting in bed upon arrival to room; alert and oriented, follows commands and agreeable to participation with session.  Endorses R hip > L shoulder pain, 6/10; meds received prior to session.  Generally guarded in all movement of R hip, requiring act assist from therapist to mobilize in limited range throughout all planes.  Currently requiring mod assist for bed mobility; min/mod assist for sit/stand, standing balance and SPT with RW from bed/chair.  Demonstrates heavy WBing bilat UEs to offset R LE in loading; no buckling to R LE noted with WBing.  Slow and guarded, effortful, but excellent effort and progression.  BP stable with transition to upright; no reports of dizziness/lightheadedness with mobility efforts.  Additional gait efforts deferred do due pain with initial mobilization; will continue to progress as appropriate in subsequent sessions. Would benefit from skilled PT to address above deficits and promote optimal return to PLOF.; recommend post-acute PT follow up as indicated by interdisciplinary care team.          If plan is discharge home, recommend the following: A lot of help with walking and/or transfers;A lot of help with bathing/dressing/bathroom   Can travel by private vehicle   Yes    Equipment Recommendations Rolling walker (2 wheels);BSC/3in1  Recommendations for Other Services       Functional Status Assessment Patient has had a recent decline in their functional status and demonstrates the ability to make significant improvements in function in a reasonable and predictable amount of time.      Precautions / Restrictions Precautions Precautions: Fall Restrictions Weight Bearing Restrictions Per Provider Order: Yes RLE Weight Bearing Per Provider Order: Weight bearing as tolerated      Mobility  Bed Mobility Overal bed mobility: Needs Assistance Bed Mobility: Supine to Sit     Supine to sit: Mod assist          Transfers Overall transfer level: Needs assistance Equipment used: Rolling walker (2 wheels) Transfers: Sit to/from Stand, Bed to chair/wheelchair/BSC Sit to Stand: Min assist, Mod assist Stand pivot transfers: Min assist, Mod assist         General transfer comment: heavy WBing bilat UEs to offset R LE in loading; no buckling to R LE noted with WBing.  Slow and guarded, effortful, but excellent effort and progression.  BP stable with transition to upright; no reports of dizziness/lightheadedness with mobility efforts.    Ambulation/Gait               General Gait Details: declined due to pain this date  Stairs            Wheelchair Mobility     Tilt Bed    Modified Rankin (Stroke Patients Only)       Balance Overall balance assessment: Needs assistance Sitting-balance support: Feet supported, No upper extremity supported Sitting balance-Leahy Scale: Good     Standing balance support: Bilateral upper extremity supported Standing balance-Leahy Scale: Fair                               Pertinent Vitals/Pain Pain Assessment Pain Assessment: Faces Pain Score: 6  Pain Location: R hip Pain Descriptors / Indicators: Aching, Grimacing, Guarding Pain Intervention(s): Limited activity within patient's tolerance, Monitored during session, Premedicated before session, Repositioned    Home Living Family/patient expects to be discharged to:: Private residence Living Arrangements: Other relatives Available Help at Discharge: Family;Available 24 hours/day Type of Home: Mobile home Home Access: Stairs to enter Entrance  Stairs-Rails: Right;Left;Can reach both Entrance Stairs-Number of Steps: 3-4   Home Layout: One level Home Equipment: None      Prior Function Prior Level of Function : Independent/Modified Independent             Mobility Comments: Indep with ADLs, household and community mobilization without assist device; home O2 at night; denies additional fall history.  Granddaughter lives with; additional family coordinates support when she's out       Extremity/Trunk Assessment   Upper Extremity Assessment Upper Extremity Assessment: LUE deficits/detail LUE Deficits / Details: L shoulder elevation, all planes, limited to shoulder height due to pain; point tender over biceps tendon    Lower Extremity Assessment Lower Extremity Assessment: RLE deficits/detail RLE Deficits / Details: R hip grossly 2/5, limited by pain, requiring act assist for movement throughout all planes; knee and ankle grossly 3+ to 4-/5 throughout       Communication   Communication Communication: No apparent difficulties    Cognition Arousal: Alert Behavior During Therapy: WFL for tasks assessed/performed   PT - Cognitive impairments: No apparent impairments                         Following commands: Intact       Cueing       General Comments      Exercises     Assessment/Plan    PT Assessment Patient needs continued PT services  PT Problem List Decreased strength;Decreased range of motion;Decreased activity tolerance;Decreased balance;Decreased safety awareness;Decreased mobility;Decreased coordination;Decreased knowledge of use of DME;Decreased knowledge of precautions;Pain       PT Treatment Interventions DME instruction;Gait training;Stair training;Functional mobility training;Therapeutic activities;Therapeutic exercise;Balance training;Patient/family education    PT Goals (Current goals can be found in the Care Plan section)  Acute Rehab PT Goals Patient Stated Goal: to return  home PT Goal Formulation: With patient/family Time For Goal Achievement: 05/30/23 Potential to Achieve Goals: Good    Frequency 7X/week     Co-evaluation               AM-PAC PT "6 Clicks" Mobility  Outcome Measure Help needed turning from your back to your side while in a flat bed without using bedrails?: A Lot Help needed moving from lying on your back to sitting on the side of a flat bed without using bedrails?: A Lot Help needed moving to and from a bed to a chair (including a wheelchair)?: A Lot Help needed standing up from a chair using your arms (e.g., wheelchair or bedside chair)?: A Lot Help needed to walk in hospital room?: A Lot Help needed climbing 3-5 steps with a railing? : A Lot 6 Click Score: 12    End of Session Equipment Utilized During Treatment: Gait belt Activity Tolerance: Patient tolerated treatment well;Patient limited by pain Patient left: in chair;with call bell/phone within reach;with chair alarm set;with family/visitor present Nurse Communication: Mobility status PT Visit Diagnosis: Muscle weakness (generalized) (M62.81);History of falling (Z91.81);Pain Pain - Right/Left: Right Pain - part of body: Hip    Time: 4098-1191 PT Time Calculation (min) (ACUTE ONLY): 35 min  Charges:   PT Evaluation $PT Eval Moderate Complexity: 1 Mod   PT General Charges $$ ACUTE PT VISIT: 1 Visit       Ioanna Colquhoun H. Bevin Bucks, PT, DPT, NCS 05/16/23, 11:11 AM [phone]

## 2023-05-16 NOTE — Evaluation (Signed)
 Occupational Therapy Evaluation Patient Details Name: [name] Browning MRN: [id number] DOB: 1940-06-30 Today's Date: 05/16/2023   History of Present Illness   presented to ER status post mechanical fall outside (while watering garden) with acute onset of R hip, L shoulder pain; admitted for management of R superior/inferior pubic ramus fracture, WBAT. PMH: nondisplaced fracture of distal phalanx of left index finger (04/15/23), HTN, COPD, pyuria, chronic diastolic heart failure, recurrent UTI, nephrolithiasis A-fib.     Clinical Impressions [name] Browning was seen for OT evaluation this date. Prior to hospital admission, pt was IND. Pt lives with granddaughter. Pt currently requires MAX A don B socks in sittng, increased time doff L sock only. MOD A + RW for bed>chair t/f. Educated pt/family on HEP and pain mgmt strategies. Pt would benefit from skilled OT to address noted impairments and functional limitations (see below for any additional details). Upon hospital discharge, recommend OT follow up <3 hours/day.      If plan is discharge home, recommend the following:   A lot of help with walking and/or transfers;A lot of help with bathing/dressing/bathroom;Help with stairs or ramp for entrance     Functional Status Assessment   Patient has had a recent decline in their functional status and demonstrates the ability to make significant improvements in function in a reasonable and predictable amount of time.     Equipment Recommendations   BSC/3in1     Recommendations for Other Services         Precautions/Restrictions   Precautions Precautions: Fall Recall of Precautions/Restrictions: Intact Required Braces or Orthoses: Splint/Cast Splint/Cast: L index finger Restrictions Weight Bearing Restrictions Per Provider Order: Yes RLE Weight Bearing Per Provider Order: Weight bearing as tolerated     Mobility Bed Mobility               General bed mobility comments: not  tested    Transfers Overall transfer level: Needs assistance Equipment used: Rolling walker (2 wheels) Transfers: Sit to/from Stand, Bed to chair/wheelchair/BSC Sit to Stand: Mod assist Stand pivot transfers: Min assist, +2 safety/equipment                Balance Overall balance assessment: Needs assistance Sitting-balance support: No upper extremity supported, Feet supported Sitting balance-Leahy Scale: Good     Standing balance support: Bilateral upper extremity supported Standing balance-Leahy Scale: Fair                             ADL either performed or assessed with clinical judgement   ADL Overall ADL's : Needs assistance/impaired                                       General ADL Comments: MAX A don B socks in sittng, increased time doff L sock only. MOD A + RW for simulated BSC t/f.      Pertinent Vitals/Pain Pain Assessment Pain Assessment: 0-10 Pain Score: 8  Pain Location: R hip Pain Descriptors / Indicators: Aching, Grimacing, Guarding Pain Intervention(s): Premedicated before session, Repositioned     Extremity/Trunk Assessment Upper Extremity Assessment Upper Extremity Assessment: LUE deficits/detail LUE Deficits / Details: L shoulder elevation, all planes, limited to shoulder height due to pain; point tender over biceps tendon LUE: Unable to fully assess due to pain   Lower Extremity Assessment Lower Extremity Assessment: Defer to PT evaluation RLE Deficits /  Details: R hip grossly 2/5, limited by pain, requiring act assist for movement throughout all planes; knee and ankle grossly 3+ to 4-/5 throughout       Communication Communication Communication: Impaired Factors Affecting Communication: Hearing impaired   Cognition Arousal: Alert Behavior During Therapy: WFL for tasks assessed/performed Cognition: No apparent impairments                               Following commands: Intact                   Home Living Family/patient expects to be discharged to:: Private residence Living Arrangements: Other relatives Available Help at Discharge: Family;Available 24 hours/day Type of Home: Mobile home Home Access: Stairs to enter Entrance Stairs-Number of Steps: 3-4 Entrance Stairs-Rails: Can reach both Home Layout: One level               Home Equipment: None          Prior Functioning/Environment Prior Level of Function : Independent/Modified Independent             Mobility Comments: Indep with ADLs, household and community mobilization without assist device; home O2 at night; denies additional fall history.  Granddaughter lives with; additional family coordinates support when she's out      OT Problem List: Decreased strength;Decreased range of motion;Decreased activity tolerance;Impaired balance (sitting and/or standing)   OT Treatment/Interventions: Self-care/ADL training;Therapeutic exercise;Energy conservation;DME and/or AE instruction;Therapeutic activities;Patient/family education;Balance training      OT Goals(Current goals can be found in the care plan section)   Acute Rehab OT Goals Patient Stated Goal: to go home OT Goal Formulation: With patient/family Time For Goal Achievement: 05/30/23 Potential to Achieve Goals: Good ADL Goals Pt Will Perform Grooming: standing;with modified independence Pt Will Perform Lower Body Dressing: sit to/from stand;with min assist;with caregiver independent in assisting;with adaptive equipment Pt Will Transfer to Toilet: ambulating;regular height toilet;with supervision   OT Frequency:  Min 3X/week    Co-evaluation              AM-PAC OT "6 Clicks" Daily Activity     Outcome Measure Help from another person eating meals?: None Help from another person taking care of personal grooming?: None Help from another person toileting, which includes using toliet, bedpan, or urinal?: A Lot Help from another  person bathing (including washing, rinsing, drying)?: A Lot Help from another person to put on and taking off regular upper body clothing?: A Little Help from another person to put on and taking off regular lower body clothing?: A Lot 6 Click Score: 17   End of Session Equipment Utilized During Treatment: Rolling walker (2 wheels);Gait belt  Activity Tolerance: Patient tolerated treatment well Patient left: in chair;with call bell/phone within reach;with chair alarm set;with family/visitor present  OT Visit Diagnosis: Other abnormalities of gait and mobility (R26.89);Muscle weakness (generalized) (M62.81)                Time: 4098-1191 OT Time Calculation (min): 16 min Charges:  OT General Charges $OT Visit: 1 Visit OT Evaluation $OT Eval Low Complexity: 1 Low  Gordan Latina, M.S. OTR/L  05/16/23, 12:01 PM  ascom [phone]

## 2023-05-16 NOTE — Progress Notes (Signed)
 PROGRESS NOTE    ELAI Browning  [name] Browning:[id number] DOB: [birth year] DOA: 05/15/2023 PCP: [name] Hove, MD    Brief Narrative:  83 y.o. male with medical history significant of atrial fibrillation not on anticoagulation, CAD on DAPT, HFpEF, severe COPD with chronic hypoxic respiratory failure no longer on supplemental oxygen , TIA, Hypertension, hyperlipidemia, recurrent UTI with multidrug-resistant E. coli BPH with LUTS, who presents to the ED due to ground-level fall.   [name] Browning states that he was working in his garden when he tripped on a garden hose, landing onto his right side.  He is endorsing significant right pelvis/hip pain with inability to bear weight. He also endorses left shoulder pain and left-sided chest pain but this is less severe.  He denies any acute symptoms prior to the fall, including chest pain, palpitations, dizziness, shortness of breath.   Otherwise, [name] Browning states he has been experiencing dysuria for 2 years with no improvement or change after multiple rounds of antibiotics.  He denies any acute abdominal pain.   Assessment & Plan:   Principal Problem:   Ground-level fall Active Problems:   Fracture of multiple pubic rami (HCC)   Pyuria   Essential hypertension, benign   Atrial fibrillation (HCC)   Coronary artery disease   COPD (chronic obstructive pulmonary disease) (HCC)   Chronic diastolic CHF (congestive heart failure) (HCC)  Ground-level fall Patient is presenting with a mechanical ground-level fall, complicated by pubic rami fractures with intractable pain. Plan: PT/OT Pain control Will likely need placement   Fracture of multiple pubic rami (HCC) X-ray with acute mildly displaced fractures of the right superior and inferior pubic rami after a mechanical ground-level fall.  Orthopedic surgery has been consulted and recommending weightbearing as tolerated.  Plan: Pain control PT OT WBAT Avoid dilaudid as it drops his pressures  Pyuria Patient  endorsing dysuria, however he is adamant it is unchanged for the last 2 years. Multiple rounds of antibiotics by Urology without improvement; recently referred for urodynamic testing  Plan: - Hold off on further antibiotics given no acute symptoms   Chronic diastolic CHF (congestive heart failure) (HCC) Patient appears euvolemic at this time.   - Continue home regimen   COPD (chronic obstructive pulmonary disease) (HCC) No shortness of breath reported at this time.   - Continue home regimen   Coronary artery disease Chest pain reported, however likely due to fall with overlying ecchymosis at site of pain   - Continue home regimen   Atrial fibrillation [facility]) Patient has a history of atrial fibrillation on metoprolol  for rate control.  He is not on anticoagulation.   - Continue home metoprolol  as allowed by BP   Essential hypertension, benign - Resume home regimen.  Currently on hold given hypotension   DVT prophylaxis: SQ lovenox  Code Status: FULL Family Communication:Daughter at bedside Disposition Plan: Status is: Observation The patient will require care spanning > 2 midnights and should be moved to inpatient because: Intractable pain in setting of mechanical fall and pubic fracture   Level of care: Med-Surg  Consultants:  Ortho (signed off, no surgical management)  Procedures:  No  Antimicrobials: None    Subjective: Seen and examined.  Had hypotension as a result of Dilaudid administration.  Otherwise stable.  Working with therapy.  Objective: Vitals:   05/16/23 0449 05/16/23 0555 05/16/23 0933 05/16/23 1101  BP: (!) 93/50 (!) 97/48 (!) 99/55   Pulse: (!) 43 (!) 44 (!) 54   Resp:   16  Temp:  (!) 97.5 F (36.4 C) (!) 97.1 F (36.2 C)   TempSrc:  Oral    SpO2:  97% 100% 96%  Weight:      Height:        Intake/Output Summary (Last 24 hours) at 05/16/2023 1142 Last data filed at 05/16/2023 1100 Gross per 24 hour  Intake 363 ml  Output 150 ml  Net  213 ml   Filed Weights   05/16/23 0328  Weight: 74.7 kg    Examination:  General exam: NAD.  Frail-appearing Respiratory system: Diminished bilaterally.  Normal work of breathing.  Room air Cardiovascular system: S1-S2, RRR, no murmurs, no pedal edema Gastrointestinal system: Soft, NT/ND, normal bowel sounds Central nervous system: Alert and oriented. No focal neurological deficits. Extremities: Symmetric 5 x 5 power. Skin: No rashes, lesions or ulcers Psychiatry: Judgement and insight appear normal. Mood & affect appropriate.     Data Reviewed: I have personally reviewed following labs and imaging studies  CBC: Recent Labs  Lab 05/15/23 1730 05/16/23 0444  WBC 10.5 7.0  NEUTROABS 8.7*  --   HGB 12.7* 10.8*  HCT 39.3 33.0*  MCV 94.0 93.8  PLT 184 131*   Basic Metabolic Panel: Recent Labs  Lab 05/15/23 1730 05/16/23 0444  NA 136 134*  K 3.9 3.6  CL 98 98  CO2 27 26  GLUCOSE 119* 104*  BUN 24* 28*  CREATININE 1.28* 1.37*  CALCIUM  9.2 8.5*   GFR: Estimated Creatinine Clearance: 44.7 mL/min (A) (by C-G formula based on SCr of 1.37 mg/dL (H)). Liver Function Tests: Recent Labs  Lab 05/15/23 1730  AST 26  ALT 15  ALKPHOS 142*  BILITOT 1.4*  PROT 7.6  ALBUMIN  4.0   No results for input(s): "LIPASE", "AMYLASE" in the last 168 hours. No results for input(s): "AMMONIA" in the last 168 hours. Coagulation Profile: No results for input(s): "INR", "PROTIME" in the last 168 hours. Cardiac Enzymes: No results for input(s): "CKTOTAL", "CKMB", "CKMBINDEX", "TROPONINI" in the last 168 hours. BNP (last 3 results) No results for input(s): "PROBNP" in the last 8760 hours. HbA1C: No results for input(s): "HGBA1C" in the last 72 hours. CBG: No results for input(s): "GLUCAP" in the last 168 hours. Lipid Profile: No results for input(s): "CHOL", "HDL", "LDLCALC", "TRIG", "CHOLHDL", "LDLDIRECT" in the last 72 hours. Thyroid  Function Tests: No results for input(s):  "TSH", "T4TOTAL", "FREET4", "T3FREE", "THYROIDAB" in the last 72 hours. Anemia Panel: No results for input(s): "VITAMINB12", "FOLATE", "FERRITIN", "TIBC", "IRON ", "RETICCTPCT" in the last 72 hours. Sepsis Labs: No results for input(s): "PROCALCITON", "LATICACIDVEN" in the last 168 hours.  No results found for this or any previous visit (from the past 240 hours).       Radiology Studies: DG Chest 2 View Result Date: 05/15/2023 CLINICAL DATA:  Chest pain EXAM: CHEST - 2 VIEW COMPARISON:  Chest x-ray 07/03/2022 FINDINGS: The heart is mildly enlarged, unchanged. There is chronic. Mild diffuse interstitial prominence. There is no evidence for central pulmonary vascular congestion. There are minimal strandy and patchy opacities in the right lung base favored as scarring or atelectasis. There is no pleural effusion or pneumothorax. No acute fractures are seen. IMPRESSION: 1. Minimal strandy and patchy opacities in the right lung base favored as scarring or atelectasis. 2. Mild cardiomegaly. Electronically Signed   By: Tyron Gallon M.D.   On: 05/15/2023 19:02   DG Hip Unilat With Pelvis 2-3 Views Right Result Date: 05/15/2023 CLINICAL DATA:  Fall outside today.  Right hip  pain. EXAM: DG HIP (WITH OR WITHOUT PELVIS) 2-3V RIGHT COMPARISON:  None Available. FINDINGS: Acute mildly displaced fractures of the right superior and inferior pubic rami approaching the pubic symphysis. No pubic symphyseal widening. No additional fracture of the hip. Femoral head is well seated. Mild right hip degenerative change with joint space narrowing and spurring. No evidence of erosion or avascular necrosis. The sacroiliac joints are congruent. IMPRESSION: 1. Acute mildly displaced fractures of the right superior and inferior pubic rami approaching the pubic symphysis. 2. Mild right hip osteoarthritis. Electronically Signed   By: Chadwick Colonel M.D.   On: 05/15/2023 17:56   DG Shoulder Left Result Date: 05/15/2023 CLINICAL  DATA:  Fall outside today.  Left shoulder pain. EXAM: LEFT SHOULDER - 2+ VIEW COMPARISON:  None Available. FINDINGS: There is no evidence of fracture or dislocation. Mild acromioclavicular degenerative change. No erosions or suspicious bone lesion. Soft tissues are unremarkable. Remote healed fractures of left ribs, no evidence of acute rib fracture. IMPRESSION: 1. No fracture or dislocation of the left shoulder. 2. Mild acromioclavicular degenerative change. Electronically Signed   By: Chadwick Colonel M.D.   On: 05/15/2023 17:55   CT Head Wo Contrast Result Date: 05/15/2023 CLINICAL DATA:  Head trauma, trip and fall EXAM: CT HEAD WITHOUT CONTRAST CT CERVICAL SPINE WITHOUT CONTRAST TECHNIQUE: Multidetector CT imaging of the head and cervical spine was performed following the standard protocol without intravenous contrast. Multiplanar CT image reconstructions of the cervical spine were also generated. RADIATION DOSE REDUCTION: This exam was performed according to the departmental dose-optimization program which includes automated exposure control, adjustment of the mA and/or kV according to patient size and/or use of iterative reconstruction technique. COMPARISON:  11/20/2010 FINDINGS: CT HEAD FINDINGS Brain: No evidence of acute infarction, hemorrhage, hydrocephalus, extra-axial collection or mass lesion/mass effect. Vascular: No hyperdense vessel or unexpected calcification. Skull: Normal. Negative for fracture or focal lesion. Sinuses/Orbits: No acute finding. Other: None. CT CERVICAL SPINE FINDINGS Alignment: Normal. Skull base and vertebrae: No acute fracture. No primary bone lesion or focal pathologic process. Soft tissues and spinal canal: No prevertebral fluid or swelling. No visible canal hematoma. Disc levels: Focally moderate disc space height loss and osteophytosis of C5-C6 with otherwise mild disc degenerative change of the cervical spine. Upper chest: Negative. Other: None. IMPRESSION: 1. No acute  intracranial pathology. 2. No fracture or static subluxation of the cervical spine. 3. Focally moderate disc space height loss and osteophytosis of C5-C6 with otherwise mild disc degenerative change of the cervical spine. Electronically Signed   By: Fredricka Jenny M.D.   On: 05/15/2023 17:17   CT Cervical Spine Wo Contrast Result Date: 05/15/2023 CLINICAL DATA:  Head trauma, trip and fall EXAM: CT HEAD WITHOUT CONTRAST CT CERVICAL SPINE WITHOUT CONTRAST TECHNIQUE: Multidetector CT imaging of the head and cervical spine was performed following the standard protocol without intravenous contrast. Multiplanar CT image reconstructions of the cervical spine were also generated. RADIATION DOSE REDUCTION: This exam was performed according to the departmental dose-optimization program which includes automated exposure control, adjustment of the mA and/or kV according to patient size and/or use of iterative reconstruction technique. COMPARISON:  11/20/2010 FINDINGS: CT HEAD FINDINGS Brain: No evidence of acute infarction, hemorrhage, hydrocephalus, extra-axial collection or mass lesion/mass effect. Vascular: No hyperdense vessel or unexpected calcification. Skull: Normal. Negative for fracture or focal lesion. Sinuses/Orbits: No acute finding. Other: None. CT CERVICAL SPINE FINDINGS Alignment: Normal. Skull base and vertebrae: No acute fracture. No primary bone lesion or focal pathologic  process. Soft tissues and spinal canal: No prevertebral fluid or swelling. No visible canal hematoma. Disc levels: Focally moderate disc space height loss and osteophytosis of C5-C6 with otherwise mild disc degenerative change of the cervical spine. Upper chest: Negative. Other: None. IMPRESSION: 1. No acute intracranial pathology. 2. No fracture or static subluxation of the cervical spine. 3. Focally moderate disc space height loss and osteophytosis of C5-C6 with otherwise mild disc degenerative change of the cervical spine. Electronically  Signed   By: Fredricka Jenny M.D.   On: 05/15/2023 17:17        Scheduled Meds:  acetaminophen   1,000 mg Oral TID   aspirin  EC  81 mg Oral QPC supper   atorvastatin   80 mg Oral Daily   clopidogrel   75 mg Oral Daily   DULoxetine   30 mg Oral Daily   Ensifentrine   2.5 mL Inhalation BID   famotidine   20 mg Oral QHS   feeding supplement  237 mL Oral BID BM   finasteride   5 mg Oral Daily   isosorbide  mononitrate  30 mg Oral BID   losartan   50 mg Oral Daily   metoprolol  succinate  25 mg Oral Daily   ranolazine   1,000 mg Oral BID   sodium chloride  flush  3 mL Intravenous Q12H   torsemide   60 mg Oral Daily   Continuous Infusions:   LOS: 0 days      Tiajuana Fluke, MD [facility]   If 7PM-7AM, please contact night-coverage  05/16/2023, 11:42 AM

## 2023-05-16 NOTE — Plan of Care (Signed)
 Pt turned to a yellow MEWS this shift. Provider was made aware of HR and low BP. No new orders. VS reassessed at 0449 d/t pt stating "I feel like I`m going to pass out." BP slowly improving. Pt on 2L o2 via [place]. Pt states he wears o2 at HS when at home.  Problem: Education: Goal: Knowledge of General Education information will improve Description: Including pain rating scale, medication(s)/side effects and non-pharmacologic comfort measures Outcome: Progressing   Problem: Health Behavior/Discharge Planning: Goal: Ability to manage health-related needs will improve Outcome: Progressing   Problem: Clinical Measurements: Goal: Ability to maintain clinical measurements within normal limits will improve Outcome: Progressing Goal: Will remain free from infection Outcome: Progressing Goal: Diagnostic test results will improve Outcome: Progressing Goal: Respiratory complications will improve Outcome: Progressing Goal: Cardiovascular complication will be avoided Outcome: Progressing   Problem: Activity: Goal: Risk for activity intolerance will decrease Outcome: Progressing   Problem: Nutrition: Goal: Adequate nutrition will be maintained Outcome: Progressing   Problem: Coping: Goal: Level of anxiety will decrease Outcome: Progressing   Problem: Elimination: Goal: Will not experience complications related to bowel motility Outcome: Progressing Goal: Will not experience complications related to urinary retention Outcome: Progressing   Problem: Pain Managment: Goal: General experience of comfort will improve and/or be controlled Outcome: Progressing   Problem: Safety: Goal: Ability to remain free from injury will improve Outcome: Progressing   Problem: Skin Integrity: Goal: Risk for impaired skin integrity will decrease Outcome: Progressing

## 2023-05-17 DIAGNOSIS — I4811 Longstanding persistent atrial fibrillation: Secondary | ICD-10-CM

## 2023-05-17 DIAGNOSIS — W1830XA Fall on same level, unspecified, initial encounter: Secondary | ICD-10-CM

## 2023-05-17 DIAGNOSIS — S32591A Other specified fracture of right pubis, initial encounter for closed fracture: Secondary | ICD-10-CM | POA: Diagnosis not present

## 2023-05-17 DIAGNOSIS — J42 Unspecified chronic bronchitis: Secondary | ICD-10-CM

## 2023-05-17 DIAGNOSIS — S32511A Fracture of superior rim of right pubis, initial encounter for closed fracture: Secondary | ICD-10-CM | POA: Diagnosis not present

## 2023-05-17 DIAGNOSIS — N3001 Acute cystitis with hematuria: Secondary | ICD-10-CM | POA: Diagnosis not present

## 2023-05-17 NOTE — TOC Initial Note (Addendum)
 Transition of Care [facility]) - Initial/Assessment Note    Patient Details  Name: [name] Browning MRN: [id number] Date of Birth: 02/18/1940  Transition of Care [facility]) CM/SW Contact:    Crayton Docker, RN 05/17/2023, 12:30 PM  Clinical Narrative:                  CM to patient's room regarding TOC screening assessment. CM introduced case management role and discharge care planning process. Patient verbalized understanding and agreement with TOC screening interview. Patient lives with granddaughter and granddaughter's husband and 1 dog --- vaccinated.   CM and patient discussed SNF and home health options. Patient has declined SNF and home health options and is agreeable to DME recommendations for rolling walker and 3 in 1 bedside commode. Patient states patient's granddaughter will provide transportation at discharge. Patient states has home oxygen  and does not have portable oxygen  with him. CM will follow up with patient's granddaughter regarding name of home oxygen  vendor.   Per patient requests to call patient's daughter, Gregary Lean. CM placed call to patient's daughter, Gail,phone: [phone] regarding name of home oxygen  vendor. No answer, CM left message for return call.  Barriers to Discharge: Continued Medical Work up   Patient Goals and CMS Choice    Home/self care   Expected Discharge Plan and Services   Discharge Planning Services: CM Consult   Living arrangements for the past 2 months: Mobile Home                 DME Arranged: Oxygen     HH Arranged: PT, OT---declined per patient   Prior Living Arrangements/Services Living arrangements for the past 2 months: Mobile Home Lives with:: Relatives, Pets Patient language and need for interpreter reviewed:: No Do you feel safe going back to the place where you live?: Yes      Need for Family Participation in Patient Care: Yes (Comment) Care giver support system in place?: Yes (comment)   Criminal Activity/Legal Involvement  Pertinent to Current Situation/Hospitalization: No - Comment as needed  Activities of Daily Living   ADL Screening (condition at time of admission) Independently performs ADLs?: Yes (appropriate for developmental age) Is the patient deaf or have difficulty hearing?: No Does the patient have difficulty seeing, even when wearing glasses/contacts?: No Does the patient have difficulty concentrating, remembering, or making decisions?: No  Permission Sought/Granted Permission sought to share information with : Family Supports, Case Manager Permission granted to share information with : Yes, Verbal Permission Granted  Share Information with NAME: Gregary Lean Perkins/James Tomasso/April Laurine Pore     Permission granted to share info w Relationship: Daughter/Son/Granddaughter  Permission granted to share info w Contact Information: yes  Emotional Assessment Appearance:: Appears stated age Attitude/Demeanor/Rapport: Engaged Affect (typically observed): Calm Orientation: : Oriented to Self, Oriented to  Time, Oriented to Place, Oriented to Situation Alcohol / Substance Use: Not Applicable Psych Involvement: No (comment)  Admission diagnosis:  Acute cystitis with hematuria [N30.01] Closed fracture of superior ramus of right pubis, initial encounter (HCC) [S32.511A] Ground-level fall [W18.30XA] Patient Active Problem List   Diagnosis Date Noted   Ground-level fall 05/15/2023   Fracture of multiple pubic rami (HCC) 05/15/2023   Pyuria 05/15/2023   GAD (generalized anxiety disorder) 12/05/2022   Cellulitis of groin 03/05/2022   COPD (chronic obstructive pulmonary disease) (HCC) 03/05/2022   Chronic diastolic CHF (congestive heart failure) (HCC) 03/05/2022   Abscess of left groin 03/05/2022   Chest pain 01/20/2022   S/P coronary artery stent placement 03/20/2021   (  HFpEF) heart failure with preserved ejection fraction (HCC) 03/13/2021   BPH (benign prostatic hyperplasia) 03/13/2021   CKD (chronic  kidney disease) 03/13/2021   GERD (gastroesophageal reflux disease) 03/13/2021   Anemia 12/08/2020   Acute ST elevation myocardial infarction (STEMI) of anterior wall (HCC)    Cardiogenic shock (HCC)    Hemorrhagic shock (HCC)    Acute on chronic diastolic CHF (congestive heart failure) (HCC) 11/11/2020   GI bleeding 11/11/2020   DVT (deep venous thrombosis) (HCC)    Dyslipidemia    Stroke [facility])    Coronary artery disease    NSTEMI (non-ST elevated myocardial infarction) (HCC)    Hypokalemia    Thrombocytopenia (HCC)    Iron  deficiency anemia    Dysphagia    Lactic acidosis    Atrial fibrillation (HCC)    Septic shock (HCC) 10/21/2020   Hypotension 10/20/2020   DOE (dyspnea on exertion) 10/06/2020   Chronic respiratory failure with hypoxia (HCC) 10/06/2020   Malnutrition of moderate degree 09/21/2020   Urinary tract obstruction by kidney stone 09/19/2020   AKI (acute kidney injury) (HCC) 09/19/2020   AF (paroxysmal atrial fibrillation) (HCC) 09/18/2020   Umbilical hernia without obstruction and without gangrene    History of 2019 novel coronavirus disease (COVID-19) 03/06/2019   COVID-19 virus infection 02/08/2019   New onset atrial fibrillation (HCC) 02/08/2019   Degenerative tear of glenoid labrum of right shoulder 08/24/2017   Injury of tendon of long head of right biceps 08/21/2017   Rotator cuff tendinitis, right 08/21/2017   Traumatic complete tear of right rotator cuff 08/21/2017   Chronic neck pain 03/09/2017   Degenerative disc disease, cervical 03/09/2017   Perennial allergic rhinitis 03/09/2017   Hyperglycemia 09/01/2015   Elevated rheumatoid factor 04/08/2015   Arthritis of both hands 04/01/2015   Bilateral hand pain 03/18/2015   Elevated alkaline phosphatase level 03/02/2015   Anxiety 02/16/2015   Intermittent chest pain 02/16/2015   History of TIA (transient ischemic attack) 05/18/2014   Essential hypertension, benign 05/18/2014   Hyperlipidemia 05/18/2014    Status post carpal tunnel release 06/26/2013   PCP:  Aisha Hove, MD Pharmacy:   [facility] [address] [place] - [address] [place] [address] Phone: [phone] Fax: [phone]  [id number] - [place], [place] - [address] AT [facility] [address] [place] [address] Phone: [phone] Fax: [phone]     Social Drivers of Health (SDOH) Social History: SDOH Screenings   Food Insecurity: No Food Insecurity (05/15/2023)  Recent Concern: Food Insecurity - Food Insecurity Present (04/17/2023)   Received from [facility]  Housing: Low Risk  (05/15/2023)  Transportation Needs: No Transportation Needs (05/15/2023)  Utilities: Not At Risk (05/15/2023)  Depression (PHQ2-9): High Risk (11/21/2022)  Financial Resource Strain: Medium Risk (04/17/2023)   Received from [facility]  Social Connections: Moderately Isolated (05/15/2023)  Tobacco Use: Medium Risk (05/16/2023)   SDOH Interventions:     Readmission Risk Interventions     No data to display

## 2023-05-17 NOTE — Progress Notes (Signed)
 PROGRESS NOTE    [name] Browning  ZHY:[id number] DOB: 11-14-1940 DOA: 05/15/2023 PCP: Aisha Hove, MD    Brief Narrative:  83 y.o. male with medical history significant of atrial fibrillation not on anticoagulation, CAD on DAPT, HFpEF, severe COPD with chronic hypoxic respiratory failure no longer on supplemental oxygen , TIA, Hypertension, hyperlipidemia, recurrent UTI with multidrug-resistant E. coli BPH with LUTS, who presents to the ED due to ground-level fall.   [name] Browning states that he was working in his garden when he tripped on a garden hose, landing onto his right side.  He is endorsing significant right pelvis/hip pain with inability to bear weight. He also endorses left shoulder pain and left-sided chest pain but this is less severe.  He denies any acute symptoms prior to the fall, including chest pain, palpitations, dizziness, shortness of breath.   Otherwise, [name] Browning states he has been experiencing dysuria for 2 years with no improvement or change after multiple rounds of antibiotics.  He denies any acute abdominal pain.  5/7: PT and OT recommends SNF but patient/family declined.  Palliative care consult   Assessment & Plan:   Principal Problem:   Ground-level fall Active Problems:   Fracture of multiple pubic rami (HCC)   Pyuria   Essential hypertension, benign   Atrial fibrillation (HCC)   Coronary artery disease   COPD (chronic obstructive pulmonary disease) (HCC)   Chronic diastolic CHF (congestive heart failure) (HCC)  Ground-level fall Patient is presenting with a mechanical ground-level fall, complicated by pubic rami fractures with intractable pain. Plan: PT/OT recommends SNF but patient/family declined Pain control, stop morphine , continue oxycodone  Will need rolling walker/2 wheels.  Bedside commode/3 and 1   Fracture of multiple pubic rami (HCC) X-ray with acute mildly displaced fractures of the right superior and inferior pubic rami after a mechanical  ground-level fall.  Orthopedic surgery has been consulted and recommending weightbearing as tolerated.  Plan: Pain control Weightbearing as tolerated and continue working with therapy  Pyuria Patient endorsing dysuria, however he is adamant it is unchanged for the last 2 years. Multiple rounds of antibiotics by Urology without improvement; recently referred for urodynamic testing  Plan: - Hold off on further antibiotics given no acute symptoms   Chronic diastolic CHF (congestive heart failure) (HCC) Patient appears euvolemic at this time.   - Continue home regimen   COPD (chronic obstructive pulmonary disease) (HCC) No shortness of breath reported at this time. May require 2 L oxygen  via nasal cannula.  He desaturated to 82% on ambulation - Continue home regimen   Coronary artery disease Chest pain reported, however likely due to fall with overlying ecchymosis at site of pain   - Continue home regimen   Atrial fibrillation [facility]) Patient has a history of atrial fibrillation on metoprolol  for rate control.  He is not on anticoagulation.   - Continue home metoprolol  as allowed by BP   Essential hypertension, benign - Resume home regimen.  Currently on hold given hypotension   DVT prophylaxis: SQ lovenox  Code Status: FULL Family Communication:Daughter updated over phone Disposition Plan: Status is: Observation.  Possible discharge tomorrow depending on clinical condition The patient will require care spanning > 2 midnights and should be moved to inpatient because: Intractable pain in setting of mechanical fall and pubic fracture   Level of care: Med-Surg  Consultants:  Ortho (signed off, no surgical management)    Subjective:  Feeling better.  Declining any kind of nursing home placement.  He would prefer  to go home  Objective: Vitals:   05/17/23 0409 05/17/23 0747 05/17/23 1500 05/17/23 1511  BP: (!) 102/57 98/61 133/64 126/69  Pulse: (!) 46 (!) 52 62 68  Resp:  16 15 16 16   Temp: (!) 97.5 F (36.4 C) 97.8 F (36.6 C) (!) 97.5 F (36.4 C) (!) 97.5 F (36.4 C)  TempSrc:  Oral Oral Oral  SpO2: 98% 98% 99% 100%  Weight:      Height:        Intake/Output Summary (Last 24 hours) at 05/17/2023 1613 Last data filed at 05/17/2023 1406 Gross per 24 hour  Intake --  Output 200 ml  Net -200 ml   Filed Weights   05/16/23 0328  Weight: 74.7 kg    Examination:  General exam: NAD.  Frail-appearing Respiratory system: Diminished bilaterally.  Normal work of breathing.  Room air Cardiovascular system: S1-S2, RRR, no murmurs, no pedal edema Gastrointestinal system: Soft, NT/ND, normal bowel sounds Central nervous system: Alert and oriented. No focal neurological deficits. Extremities: Symmetric 5 x 5 power. Skin: No rashes, lesions or ulcers Psychiatry: Judgement and insight appear normal. Mood & affect appropriate.     Data Reviewed: I have personally reviewed following labs and imaging studies  CBC: Recent Labs  Lab 05/15/23 1730 05/16/23 0444  WBC 10.5 7.0  NEUTROABS 8.7*  --   HGB 12.7* 10.8*  HCT 39.3 33.0*  MCV 94.0 93.8  PLT 184 131*   Basic Metabolic Panel: Recent Labs  Lab 05/15/23 1730 05/16/23 0444  NA 136 134*  K 3.9 3.6  CL 98 98  CO2 27 26  GLUCOSE 119* 104*  BUN 24* 28*  CREATININE 1.28* 1.37*  CALCIUM  9.2 8.5*   GFR: Estimated Creatinine Clearance: 44.7 mL/min (A) (by C-G formula based on SCr of 1.37 mg/dL (H)). Liver Function Tests: Recent Labs  Lab 05/15/23 1730  AST 26  ALT 15  ALKPHOS 142*  BILITOT 1.4*  PROT 7.6  ALBUMIN  4.0         Radiology Studies: DG Chest 2 View Result Date: 05/15/2023 CLINICAL DATA:  Chest pain EXAM: CHEST - 2 VIEW COMPARISON:  Chest x-ray 07/03/2022 FINDINGS: The heart is mildly enlarged, unchanged. There is chronic. Mild diffuse interstitial prominence. There is no evidence for central pulmonary vascular congestion. There are minimal strandy and patchy opacities in  the right lung base favored as scarring or atelectasis. There is no pleural effusion or pneumothorax. No acute fractures are seen. IMPRESSION: 1. Minimal strandy and patchy opacities in the right lung base favored as scarring or atelectasis. 2. Mild cardiomegaly. Electronically Signed   By: Tyron Gallon M.D.   On: 05/15/2023 19:02   DG Hip Unilat With Pelvis 2-3 Views Right Result Date: 05/15/2023 CLINICAL DATA:  Fall outside today.  Right hip pain. EXAM: DG HIP (WITH OR WITHOUT PELVIS) 2-3V RIGHT COMPARISON:  None Available. FINDINGS: Acute mildly displaced fractures of the right superior and inferior pubic rami approaching the pubic symphysis. No pubic symphyseal widening. No additional fracture of the hip. Femoral head is well seated. Mild right hip degenerative change with joint space narrowing and spurring. No evidence of erosion or avascular necrosis. The sacroiliac joints are congruent. IMPRESSION: 1. Acute mildly displaced fractures of the right superior and inferior pubic rami approaching the pubic symphysis. 2. Mild right hip osteoarthritis. Electronically Signed   By: Chadwick Colonel M.D.   On: 05/15/2023 17:56   DG Shoulder Left Result Date: 05/15/2023 CLINICAL DATA:  Fall outside today.  Left shoulder pain. EXAM: LEFT SHOULDER - 2+ VIEW COMPARISON:  None Available. FINDINGS: There is no evidence of fracture or dislocation. Mild acromioclavicular degenerative change. No erosions or suspicious bone lesion. Soft tissues are unremarkable. Remote healed fractures of left ribs, no evidence of acute rib fracture. IMPRESSION: 1. No fracture or dislocation of the left shoulder. 2. Mild acromioclavicular degenerative change. Electronically Signed   By: Chadwick Colonel M.D.   On: 05/15/2023 17:55   CT Head Wo Contrast Result Date: 05/15/2023 CLINICAL DATA:  Head trauma, trip and fall EXAM: CT HEAD WITHOUT CONTRAST CT CERVICAL SPINE WITHOUT CONTRAST TECHNIQUE: Multidetector CT imaging of the head and  cervical spine was performed following the standard protocol without intravenous contrast. Multiplanar CT image reconstructions of the cervical spine were also generated. RADIATION DOSE REDUCTION: This exam was performed according to the departmental dose-optimization program which includes automated exposure control, adjustment of the mA and/or kV according to patient size and/or use of iterative reconstruction technique. COMPARISON:  11/20/2010 FINDINGS: CT HEAD FINDINGS Brain: No evidence of acute infarction, hemorrhage, hydrocephalus, extra-axial collection or mass lesion/mass effect. Vascular: No hyperdense vessel or unexpected calcification. Skull: Normal. Negative for fracture or focal lesion. Sinuses/Orbits: No acute finding. Other: None. CT CERVICAL SPINE FINDINGS Alignment: Normal. Skull base and vertebrae: No acute fracture. No primary bone lesion or focal pathologic process. Soft tissues and spinal canal: No prevertebral fluid or swelling. No visible canal hematoma. Disc levels: Focally moderate disc space height loss and osteophytosis of C5-C6 with otherwise mild disc degenerative change of the cervical spine. Upper chest: Negative. Other: None. IMPRESSION: 1. No acute intracranial pathology. 2. No fracture or static subluxation of the cervical spine. 3. Focally moderate disc space height loss and osteophytosis of C5-C6 with otherwise mild disc degenerative change of the cervical spine. Electronically Signed   By: Fredricka Jenny M.D.   On: 05/15/2023 17:17   CT Cervical Spine Wo Contrast Result Date: 05/15/2023 CLINICAL DATA:  Head trauma, trip and fall EXAM: CT HEAD WITHOUT CONTRAST CT CERVICAL SPINE WITHOUT CONTRAST TECHNIQUE: Multidetector CT imaging of the head and cervical spine was performed following the standard protocol without intravenous contrast. Multiplanar CT image reconstructions of the cervical spine were also generated. RADIATION DOSE REDUCTION: This exam was performed according to the  departmental dose-optimization program which includes automated exposure control, adjustment of the mA and/or kV according to patient size and/or use of iterative reconstruction technique. COMPARISON:  11/20/2010 FINDINGS: CT HEAD FINDINGS Brain: No evidence of acute infarction, hemorrhage, hydrocephalus, extra-axial collection or mass lesion/mass effect. Vascular: No hyperdense vessel or unexpected calcification. Skull: Normal. Negative for fracture or focal lesion. Sinuses/Orbits: No acute finding. Other: None. CT CERVICAL SPINE FINDINGS Alignment: Normal. Skull base and vertebrae: No acute fracture. No primary bone lesion or focal pathologic process. Soft tissues and spinal canal: No prevertebral fluid or swelling. No visible canal hematoma. Disc levels: Focally moderate disc space height loss and osteophytosis of C5-C6 with otherwise mild disc degenerative change of the cervical spine. Upper chest: Negative. Other: None. IMPRESSION: 1. No acute intracranial pathology. 2. No fracture or static subluxation of the cervical spine. 3. Focally moderate disc space height loss and osteophytosis of C5-C6 with otherwise mild disc degenerative change of the cervical spine. Electronically Signed   By: Fredricka Jenny M.D.   On: 05/15/2023 17:17        Scheduled Meds:  acetaminophen   1,000 mg Oral TID   aspirin  EC  81 mg Oral QPC supper  atorvastatin   80 mg Oral Daily   clopidogrel   75 mg Oral Daily   DULoxetine   30 mg Oral Daily   enoxaparin  (LOVENOX ) injection  40 mg Subcutaneous Q24H   famotidine   20 mg Oral QHS   feeding supplement  237 mL Oral BID BM   finasteride   5 mg Oral Daily   isosorbide  mononitrate  30 mg Oral BID   losartan   50 mg Oral Daily   metoprolol  succinate  25 mg Oral Daily   pantoprazole   40 mg Oral BID   ranolazine   1,000 mg Oral BID   sodium chloride  flush  3 mL Intravenous Q12H   torsemide   60 mg Oral Daily   Continuous Infusions:   LOS: 0 days    Time spent 35  minutes  Brenna Cam, MD [facility]   If 7PM-7AM, please contact night-coverage  05/17/2023, 4:13 PM

## 2023-05-17 NOTE — Progress Notes (Signed)
 Notified MD of bp of 98/61, hr 51. MD requests Cozaar , Toprol  Xl and Imdur  be held this morning. Demadex  may be given per MD.

## 2023-05-17 NOTE — Progress Notes (Signed)
 Physical Therapy Treatment Patient Details Name: [name] Browning MRN: [id number] DOB: 1940-11-01 Today's Date: 05/17/2023   History of Present Illness presented to ER status post mechanical fall outside (while watering garden) with acute onset of R hip, L shoulder pain; admitted for management of R superior/inferior pubic ramus fracture, WBAT. PMH: nondisplaced fracture of distal phalanx of left index finger (04/15/23), HTN, COPD, pyuria, chronic diastolic heart failure, recurrent UTI, nephrolithiasis A-fib.    PT Comments  Pt was long sitting in bed upon arrival. He is A and O x 3. Agreeable to session and cooperative throughout. Pt was agreeable to get OOB but still severely limited by pain. Pt had two supportive caregivers/ family members present. Discussed post acute DC disposition. Pt/pt's family have no interest in rehab placement and want to DC home. Pt has ramp entry to home + 24/7 assistance. He will continue to benefit from skilled PT at DC to maximize independence and safety with all ADLs. SNF remains appropriate however if pt unwilling, HHPT recommended.      If plan is discharge home, recommend the following: A lot of help with walking and/or transfers;A lot of help with bathing/dressing/bathroom     Equipment Recommendations  Rolling walker (2 wheels);BSC/3in1       Precautions / Restrictions Precautions Precautions: Fall Recall of Precautions/Restrictions: Intact Required Braces or Orthoses: Splint/Cast Splint/Cast: L index finger Restrictions Weight Bearing Restrictions Per Provider Order: Yes RLE Weight Bearing Per Provider Order: Weight bearing as tolerated     Mobility  Bed Mobility Overal bed mobility: Needs Assistance Bed Mobility: Supine to Sit  Supine to sit: Contact guard, Min assist  General bed mobility comments: pt does require just cga-min assist  mosty for improved sequencing and tacle cuing. slow moving due to pain.    Transfers Overall transfer level:  Needs assistance Equipment used: Rolling walker (2 wheels) Transfers: Sit to/from Stand Sit to Stand: Contact guard assist, From elevated surface, Min assist  General transfer comment: CGA for elevated surface + min from standard heights    Ambulation/Gait Ambulation/Gait assistance: Supervision Gait Distance (Feet): 15 Feet Assistive device: Rolling walker (2 wheels) Gait Pattern/deviations: Step-to pattern, Antalgic Gait velocity: decreased  General Gait Details: Pt tolerated ambulation ~ 15 ft however overall limited by pain. Requested Bolivar Bushman return later this date, closer to after pain meds given to see if he can progress gait distances.   Balance Overall balance assessment: Needs assistance Sitting-balance support: No upper extremity supported, Feet supported Sitting balance-Leahy Scale: Good     Standing balance support: Bilateral upper extremity supported, During functional activity, Reliant on assistive device for balance Standing balance-Leahy Scale: Fair Standing balance comment: reliant on RW/BUE support for all dynamic standing activity          Cognition Arousal: Alert Behavior During Therapy: WFL for tasks assessed/performed   PT - Cognitive impairments: No apparent impairments      PT - Cognition Comments: Pt is A and O x 4 Following commands: Intact      Cueing Cueing Techniques: Verbal cues         Pertinent Vitals/Pain Pain Assessment Pain Assessment: 0-10 Pain Score: 6  Pain Location: R hip Pain Descriptors / Indicators: Aching, Grimacing, Guarding Pain Intervention(s): Limited activity within patient's tolerance, Monitored during session, Premedicated before session, Repositioned     PT Goals (current goals can now be found in the care plan section) Acute Rehab PT Goals Patient Stated Goal: to return home Progress towards PT goals: Progressing toward  goals    Frequency    7X/week       AM-PAC PT "6 Clicks" Mobility   Outcome  Measure  Help needed turning from your back to your side while in a flat bed without using bedrails?: A Little Help needed moving from lying on your back to sitting on the side of a flat bed without using bedrails?: A Little Help needed moving to and from a bed to a chair (including a wheelchair)?: A Little Help needed standing up from a chair using your arms (e.g., wheelchair or bedside chair)?: A Little Help needed to walk in hospital room?: A Little Help needed climbing 3-5 steps with a railing? : A Lot 6 Click Score: 17    End of Session Equipment Utilized During Treatment: Gait belt Activity Tolerance: Patient tolerated treatment well;Patient limited by pain Patient left: in chair;with call bell/phone within reach;with chair alarm set;with family/visitor present Nurse Communication: Mobility status PT Visit Diagnosis: Muscle weakness (generalized) (M62.81);History of falling (Z91.81);Pain Pain - Right/Left: Right Pain - part of body: Hip     Time: 1610-9604 PT Time Calculation (min) (ACUTE ONLY): 16 min  Charges:    $Gait Training: 8-22 mins PT General Charges $$ ACUTE PT VISIT: 1 Visit                     Chester Costa PTA 05/17/23, 1:20 PM

## 2023-05-17 NOTE — Plan of Care (Signed)

## 2023-05-18 ENCOUNTER — Observation Stay

## 2023-05-18 DIAGNOSIS — S32591A Other specified fracture of right pubis, initial encounter for closed fracture: Secondary | ICD-10-CM | POA: Diagnosis not present

## 2023-05-18 DIAGNOSIS — R140 Abdominal distension (gaseous): Secondary | ICD-10-CM | POA: Diagnosis not present

## 2023-05-18 DIAGNOSIS — N3001 Acute cystitis with hematuria: Secondary | ICD-10-CM | POA: Diagnosis not present

## 2023-05-18 DIAGNOSIS — S32511A Fracture of superior rim of right pubis, initial encounter for closed fracture: Secondary | ICD-10-CM | POA: Diagnosis not present

## 2023-05-18 DIAGNOSIS — Z515 Encounter for palliative care: Secondary | ICD-10-CM | POA: Diagnosis not present

## 2023-05-18 DIAGNOSIS — I4811 Longstanding persistent atrial fibrillation: Secondary | ICD-10-CM | POA: Diagnosis not present

## 2023-05-18 DIAGNOSIS — J42 Unspecified chronic bronchitis: Secondary | ICD-10-CM | POA: Diagnosis not present

## 2023-05-18 DIAGNOSIS — R109 Unspecified abdominal pain: Secondary | ICD-10-CM | POA: Diagnosis not present

## 2023-05-18 DIAGNOSIS — W1830XA Fall on same level, unspecified, initial encounter: Secondary | ICD-10-CM | POA: Diagnosis not present

## 2023-05-18 DIAGNOSIS — Z7189 Other specified counseling: Secondary | ICD-10-CM | POA: Diagnosis not present

## 2023-05-18 DIAGNOSIS — N200 Calculus of kidney: Secondary | ICD-10-CM | POA: Diagnosis not present

## 2023-05-18 LAB — BASIC METABOLIC PANEL WITH GFR
Anion gap: 9 (ref 5–15)
BUN: 32 mg/dL — ABNORMAL HIGH (ref 8–23)
CO2: 28 mmol/L (ref 22–32)
Calcium: 8.5 mg/dL — ABNORMAL LOW (ref 8.9–10.3)
Chloride: 101 mmol/L (ref 98–111)
Creatinine, Ser: 1.34 mg/dL — ABNORMAL HIGH (ref 0.61–1.24)
GFR, Estimated: 53 mL/min — ABNORMAL LOW (ref >60)
Glucose, Bld: 116 mg/dL — ABNORMAL HIGH (ref 70–99)
Potassium: 4 mmol/L (ref 3.5–5.1)
Sodium: 138 mmol/L (ref 135–145)

## 2023-05-18 LAB — CBC
HCT: 31.2 % — ABNORMAL LOW (ref 39.0–52.0)
Hemoglobin: 10.2 g/dL — ABNORMAL LOW (ref 13.0–17.0)
MCH: 30.5 pg (ref 26.0–34.0)
MCHC: 32.7 g/dL (ref 30.0–36.0)
MCV: 93.4 fL (ref 80.0–100.0)
Platelets: 141 10*3/uL — ABNORMAL LOW (ref 150–400)
RBC: 3.34 MIL/uL — ABNORMAL LOW (ref 4.22–5.81)
RDW: 13.4 % (ref 11.5–15.5)
WBC: 7.5 10*3/uL (ref 4.0–10.5)
nRBC: 0 % (ref 0.0–0.2)

## 2023-05-18 MED ORDER — SENNOSIDES-DOCUSATE SODIUM 8.6-50 MG PO TABS
2.0000 | ORAL_TABLET | Freq: Two times a day (BID) | ORAL | Status: DC
  Administered 2023-05-18 – 2023-05-19 (×3): 2 via ORAL
  Filled 2023-05-18 (×3): qty 2

## 2023-05-18 MED ORDER — TORSEMIDE 20 MG PO TABS
20.0000 mg | ORAL_TABLET | Freq: Every day | ORAL | Status: DC
Start: 2023-05-19 — End: 2023-05-19
  Administered 2023-05-19: 20 mg via ORAL
  Filled 2023-05-18: qty 1

## 2023-05-18 MED ORDER — OXYCODONE HCL 5 MG PO TABS: 5.0000 mg | ORAL_TABLET | Freq: Four times a day (QID) | ORAL | Status: DC | PRN

## 2023-05-18 MED ORDER — IBUPROFEN 400 MG PO TABS: 400.0000 mg | ORAL_TABLET | Freq: Four times a day (QID) | ORAL | Status: DC | PRN

## 2023-05-18 MED ORDER — POLYETHYLENE GLYCOL 3350 17 G PO PACK
17.0000 g | PACK | Freq: Two times a day (BID) | ORAL | Status: DC
  Administered 2023-05-18 – 2023-05-19 (×3): 17 g via ORAL
  Filled 2023-05-18 (×3): qty 1

## 2023-05-18 MED ORDER — SORBITOL 70 % SOLN
30.0000 mL | Freq: Every day | Status: DC | PRN
  Filled 2023-05-18: qty 30

## 2023-05-18 NOTE — Plan of Care (Signed)
  Problem: Education: Goal: Knowledge of General Education information will improve Description: Including pain rating scale, medication(s)/side effects and non-pharmacologic comfort measures Outcome: Progressing   Problem: Health Behavior/Discharge Planning: Goal: Ability to manage health-related needs will improve Outcome: Progressing   Problem: Clinical Measurements: Goal: Ability to maintain clinical measurements within normal limits will improve Outcome: Progressing Goal: Will remain free from infection Outcome: Progressing Goal: Diagnostic test results will improve Outcome: Progressing Goal: Respiratory complications will improve Outcome: Progressing   Problem: Activity: Goal: Risk for activity intolerance will decrease Outcome: Progressing   Problem: Nutrition: Goal: Adequate nutrition will be maintained Outcome: Progressing   Problem: Elimination: Goal: Will not experience complications related to bowel motility Outcome: Progressing   Problem: Pain Managment: Goal: General experience of comfort will improve and/or be controlled Outcome: Progressing   Problem: Safety: Goal: Ability to remain free from injury will improve Outcome: Progressing   Problem: Skin Integrity: Goal: Risk for impaired skin integrity will decrease Outcome: Progressing

## 2023-05-18 NOTE — Plan of Care (Signed)

## 2023-05-18 NOTE — Progress Notes (Signed)
 SaO2 on room air at rest = 91% SaO2 on room air while ambulating = 82% SaO2 on 2 liters of O2 while ambulating = 94%

## 2023-05-18 NOTE — TOC Progression Note (Signed)
 Transition of Care [facility]) - Progression Note    Patient Details  Name: [name] Browning MRN: [id number] Date of Birth: 1941-01-08  [facility] CM/SW Contact  Crayton Docker, RN 05/18/2023, 11:12 AM  Clinical Narrative:     CM alert to Sam Creighton, [employer] regarding patient oxygen . Per Sam Creighton, [employer] provides nocturnal oxygen  and patient will require walk test for continuous oxygen . Patient currently on 2LPM continuous. Noted, walk test, [employer] informed of walk test documentation today for continuous oxygen  and request for oxygen  delivery to bedside today.     Barriers to Discharge: Continued Medical Work up  Expected Discharge Plan and Services   Discharge Planning Services: CM Consult   Living arrangements for the past 2 months: Mobile Home                 DME Arranged: Oxygen     HH Arranged: PT, OT---patient declined  Social Determinants of Health (SDOH) Interventions SDOH Screenings   Food Insecurity: No Food Insecurity (05/15/2023)  Recent Concern: Food Insecurity - Food Insecurity Present (04/17/2023)   Received from [facility]  Housing: Low Risk  (05/15/2023)  Transportation Needs: No Transportation Needs (05/15/2023)  Utilities: Not At Risk (05/15/2023)  Depression (PHQ2-9): High Risk (11/21/2022)  Financial Resource Strain: Medium Risk (04/17/2023)   Received from [facility]  Social Connections: Moderately Isolated (05/15/2023)  Tobacco Use: Medium Risk (05/16/2023)    Readmission Risk Interventions     No data to display

## 2023-05-18 NOTE — Progress Notes (Addendum)
 Physical Therapy Treatment Patient Details Name: [name] Browning MRN: [id number] DOB: Feb 04, 1940 Today's Date: 05/18/2023   History of Present Illness presented to ER status post mechanical fall outside (while watering garden) with acute onset of R hip, L shoulder pain; admitted for management of R superior/inferior pubic ramus fracture, WBAT. PMH: nondisplaced fracture of distal phalanx of left index finger (04/15/23), HTN, COPD, pyuria, chronic diastolic heart failure, recurrent UTI, nephrolithiasis A-fib.    PT Comments  Pt was long sitting in bed with supportive daughter at bedside. He is alert and O x 3 but lacks overall good insight of situation. He does continue to endorse "soreness/pain" in hip however pain did not limit session progression. Pt was able to exit bed, stand to RW, and tolerate ambulation without LOB. Distance limited by pain and desaturation to 82% on rm air. Returned to room and replaced o2 with sao2 quickly recovering to > 92%. Discuss pt using home O2 all the time versus prior to admission when he only wears at night.  Overall pt continues to improve. Pt remains steadfast in Dcing directly home once deemed medical stable.    If plan is discharge home, recommend the following: A little help with walking and/or transfers;A lot of help with bathing/dressing/bathroom;Assistance with cooking/housework;Direct supervision/assist for medications management;Direct supervision/assist for financial management;Assist for transportation;Help with stairs or ramp for entrance;Supervision due to cognitive status     Equipment Recommendations  Rolling walker (2 wheels)       Precautions / Restrictions Precautions Precautions: Fall Recall of Precautions/Restrictions: Intact Required Braces or Orthoses: Splint/Cast Splint/Cast: L index finger Restrictions Weight Bearing Restrictions Per Provider Order: Yes RLE Weight Bearing Per Provider Order: Weight bearing as tolerated     Mobility   Bed Mobility Overal bed mobility: Needs Assistance Bed Mobility: Supine to Sit, Sit to Supine  Supine to sit: Min assist Sit to supine: Min assist   Transfers Overall transfer level: Needs assistance Equipment used: Rolling walker (2 wheels) Transfers: Sit to/from Stand Sit to Stand: Supervision     Ambulation/Gait Ambulation/Gait assistance: Supervision Gait Distance (Feet): 70 Feet Assistive device: Rolling walker (2 wheels) Gait Pattern/deviations: Step-to pattern, Antalgic, Trunk flexed Gait velocity: decreased  General Gait Details: Pt was able to tolerate increased gait distance however still limited by pain. pt desaturated to 82% when placed on rm air during ambulation. Reapplied 2L O2 with pt recovering to >93%   Balance Overall balance assessment: Needs assistance Sitting-balance support: No upper extremity supported, Feet supported Sitting balance-Leahy Scale: Good     Standing balance support: Bilateral upper extremity supported, During functional activity, Reliant on assistive device for balance Standing balance-Leahy Scale: Fair Standing balance comment: reliant on RW/BUE support for all dynamic standing activity          Cognition Arousal: Alert Behavior During Therapy: WFL for tasks assessed/performed   PT - Cognitive impairments: No apparent impairments    PT - Cognition Comments: Pt is A and O x 3. cooperative and pleasant. Motivated to improve. Supportive daughter present throughout Following commands: Intact      Cueing Cueing Techniques: Verbal cues         Pertinent Vitals/Pain Pain Assessment Pain Assessment: 0-10 Pain Score: 8  Pain Location: R hip Pain Descriptors / Indicators: Aching, Grimacing, Guarding Pain Intervention(s): Limited activity within patient's tolerance, Monitored during session, Repositioned     PT Goals (current goals can now be found in the care plan section) Acute Rehab PT Goals Patient Stated Goal: to return  home Progress towards PT goals: Progressing toward goals    Frequency    7X/week       AM-PAC PT "6 Clicks" Mobility   Outcome Measure  Help needed turning from your back to your side while in a flat bed without using bedrails?: A Little Help needed moving from lying on your back to sitting on the side of a flat bed without using bedrails?: A Little Help needed moving to and from a bed to a chair (including a wheelchair)?: A Little Help needed standing up from a chair using your arms (e.g., wheelchair or bedside chair)?: A Little Help needed to walk in hospital room?: A Little Help needed climbing 3-5 steps with a railing? : A Lot 6 Click Score: 17    End of Session   Activity Tolerance: Patient tolerated treatment well Patient left: in bed;with call bell/phone within reach;with family/visitor present Nurse Communication: Mobility status PT Visit Diagnosis: Muscle weakness (generalized) (M62.81);History of falling (Z91.81);Pain Pain - Right/Left: Right Pain - part of body: Hip     Time: 0852-0907 PT Time Calculation (min) (ACUTE ONLY): 15 min  Charges:    $Therapeutic Activity: 8-22 mins PT General Charges $$ ACUTE PT VISIT: 1 Visit                    Chester Costa PTA 05/18/23, 9:26 AM

## 2023-05-18 NOTE — Progress Notes (Signed)
 Occupational Therapy Treatment Patient Details Name: [name] Browning MRN: [id number] DOB: 10-23-1940 Today's Date: 05/18/2023   History of present illness presented to ER status post mechanical fall outside (while watering garden) with acute onset of R hip, L shoulder pain; admitted for management of R superior/inferior pubic ramus fracture, WBAT. PMH: nondisplaced fracture of distal phalanx of left index finger (04/15/23), HTN, COPD, pyuria, chronic diastolic heart failure, recurrent UTI, nephrolithiasis A-fib.   OT comments  [name] Browning was seen for OT treatment on this date. Upon arrival to room pt in bed, agreeable to tx. Pt requires SUPERVISION for bed mobility, MIN A + RW sit<>stand. SBA + RW for ADL t/f ~50 ft, limited by nausea. SpO2 95% on 2L [place]. Pt making good progress toward goals, will continue to follow POC. Discharge recommendation remains appropriate.        If plan is discharge home, recommend the following:  A lot of help with walking and/or transfers;A lot of help with bathing/dressing/bathroom;Help with stairs or ramp for entrance   Equipment Recommendations  BSC/3in1    Recommendations for Other Services      Precautions / Restrictions Precautions Precautions: Fall Recall of Precautions/Restrictions: Intact Required Braces or Orthoses: Splint/Cast Splint/Cast: L index finger Restrictions Weight Bearing Restrictions Per Provider Order: Yes RLE Weight Bearing Per Provider Order: Weight bearing as tolerated       Mobility Bed Mobility Overal bed mobility: Needs Assistance Bed Mobility: Supine to Sit, Sit to Supine     Supine to sit: Supervision Sit to supine: Supervision        Transfers Overall transfer level: Needs assistance Equipment used: Rolling walker (2 wheels) Transfers: Sit to/from Stand Sit to Stand: Min assist                 Balance Overall balance assessment: Needs assistance Sitting-balance support: No upper extremity supported, Feet  supported Sitting balance-Leahy Scale: Good     Standing balance support: Single extremity supported, During functional activity Standing balance-Leahy Scale: Fair                             ADL either performed or assessed with clinical judgement   ADL Overall ADL's : Needs assistance/impaired                                       General ADL Comments: MIN A don B socks.    Extremity/Trunk Assessment              Vision       Restaurant manager, fast food Communication: Impaired   Cognition Arousal: Alert Behavior During Therapy: WFL for tasks assessed/performed Cognition: No apparent impairments                               Following commands: Intact        Cueing   Cueing Techniques: Verbal cues  Exercises      Shoulder Instructions       General Comments      Pertinent Vitals/ Pain       Pain Assessment Pain Assessment: Faces Faces Pain Scale: Hurts little more Pain Location: R hip Pain Descriptors / Indicators: Aching, Grimacing, Guarding Pain Intervention(s): Limited activity within patient's tolerance, Repositioned  Home  Living                                          Prior Functioning/Environment              Frequency  Min 3X/week        Progress Toward Goals  OT Goals(current goals can now be found in the care plan section)  Progress towards OT goals: Progressing toward goals  Acute Rehab OT Goals OT Goal Formulation: With patient/family Time For Goal Achievement: 05/30/23 Potential to Achieve Goals: Good ADL Goals Pt Will Perform Grooming: standing;with modified independence Pt Will Perform Lower Body Dressing: sit to/from stand;with min assist;with caregiver independent in assisting;with adaptive equipment Pt Will Transfer to Toilet: ambulating;regular height toilet;with supervision  Plan      Co-evaluation                  AM-PAC OT "6 Clicks" Daily Activity     Outcome Measure   Help from another person eating meals?: None Help from another person taking care of personal grooming?: None Help from another person toileting, which includes using toliet, bedpan, or urinal?: A Lot Help from another person bathing (including washing, rinsing, drying)?: A Lot Help from another person to put on and taking off regular upper body clothing?: A Little Help from another person to put on and taking off regular lower body clothing?: A Lot 6 Click Score: 17    End of Session Equipment Utilized During Treatment: Rolling walker (2 wheels);Gait belt  OT Visit Diagnosis: Other abnormalities of gait and mobility (R26.89);Muscle weakness (generalized) (M62.81)   Activity Tolerance Patient tolerated treatment well   Patient Left in bed;with call bell/phone within reach;with bed alarm set   Nurse Communication          Time: 1610-9604 OT Time Calculation (min): 15 min  Charges: OT General Charges $OT Visit: 1 Visit OT Treatments $Self Care/Home Management : 8-22 mins  [name] Browning, M.S. OTR/L  05/18/23, 4:19 PM  ascom [phone]

## 2023-05-18 NOTE — Consult Note (Signed)
 Consultation Note Date: 05/18/2023   Patient Name: [name] Browning  DOB: 1940-04-03  MRN: [id number]  Age / Sex: 83 y.o., male  PCP: Aisha Hove, MD Referring Physician: Brenna Cam, MD  Reason for Consultation: Establishing goals of care  HPI/Patient Profile: 83 y.o. male  with past medical history of atrial fibrillation not on anticoagulation, CAD on DAPT, HFpEF, severe COPD with chronic hypoxic respiratory failure no longer on supplemental oxygen , TIA, Hypertension, hyperlipidemia, recurrent UTI with multidrug-resistant E. coli BPH with LUTS  admitted on 05/15/2023 with fall tripping over garden hose resulting in pubic rami fractures.   Clinical Assessment and Goals of Care: Consult received and chart review completed. I met today with [name] Browning with granddaughter, April, and great grandson at bedside. They share that he has been doing really well at home. He drives and goes fishing. He was out in his yard watering his tomato plants when he tripped over the water  hose. He has been very functional and active at home. They report that he eats well most days. April and her children live with him. He has 6 children total and multiple grands. He has had some N/V at home and they have thoughts that this has been from multiple courses of antibiotics for UTI. He also has constipation.   April had to leave to pick up her other children. I discussed with [name] Browning any concerns he has. He has no concerns. He has good QOL and a very supportive family committed to care for him at home. He reports that granddaughter April and daughter Gregary Lean are his HCPOAs. We discussed any discussion of his wishes, specifically his code status. I reiterated that I do not feel we are in danger of this happening but it is good to be prepared. We discussed desire for CPR/intubation if his heart/lungs stop and he tells me know. He holds up his hand and  waves and tells me "bye, bye, I want to just die in my sleep." He has no desire for resuscitative measures but he does desire all other treatment options.   I called and spoke with Gregary Lean and then with April. I explained my role and conversation with palliative care and my conversation with [name] Browning. They both expressed understanding and open to discussions. They would like to discuss and confirm wishes with [name] Browning themselves.   All questions/concerns addressed. Emotional support provided.   Primary Decision Maker PATIENT    SUMMARY OF RECOMMENDATIONS   - Family discussing code status - Home with family support  Code Status/Advance Care Planning: Full code - discussions ongoing   Symptom Management:  Per attending  Prognosis:  Unable to determine  Discharge Planning: Home with Home Health      Primary Diagnoses: Present on Admission:  Ground-level fall  Essential hypertension, benign  Atrial fibrillation (HCC)  Coronary artery disease  Chronic diastolic CHF (congestive heart failure) (HCC)  COPD (chronic obstructive pulmonary disease) (HCC)   I have reviewed the medical record, interviewed the patient and family, and  examined the patient. The following aspects are pertinent.  Past Medical History:  Diagnosis Date   Anginal pain (HCC)    Anxiety    Aortic atherosclerosis (HCC)    Atrial fibrillation (HCC) 01/2019   Bilateral carpal tunnel syndrome    CAD (coronary artery disease)    a.) LHC 2004 --> normal coronaries. b.) normal stress test in 2007 and 2011; c.) Lexiscan 05/20/2014 --> LVEF 55-65%; no significant stress induced ischemia/arrythmia. d.) CT chest 03/25/2019 --> coronaries carcified.   Carotid atherosclerosis, bilateral    Carpal tunnel syndrome, bilateral    CHF (congestive heart failure) (HCC)    Chronic anticoagulation    a.) ASA + apixaban    COPD (chronic obstructive pulmonary disease) (HCC)    CVA (cerebral vascular accident) (HCC)     Degenerative disc disease, cervical    Diastolic dysfunction    a.) TTE 05/29/2014 --> LVEF 60-65%; G1DD.   DVT (deep venous thrombosis) (HCC)    GERD (gastroesophageal reflux disease)    History of 2019 novel coronavirus disease (COVID-19) 02/08/2019   HLD (hyperlipidemia)    Hypertension    Kidney stones    Osteoarthritis of right shoulder    Pneumonia    Respiratory failure, acute (HCC) 09/20/2020   a.) severe respiratory distress 1 hour after urological surgery. CXR (+) for acute pulmonary edema. Transferred to ICU and placed on NIPPV. Questionable aspiration PNA. (+) A.fib with RVR. Improved with ABX, diuresis, and amiodarone .   Skin cancer of face    a.) RIGHT ear and RIGHT forehead; excised.   Syncope    TIA (transient ischemic attack) 2016   Valvular regurgitation    a.) TTE 05/26/2014 --> LVEF 60-65%; trivial MR, mild TR; no AR or PR. b.) TTE 04/20/2015 --> LVEF 55-60%; trivial MR and PR; no AR or TR.   Social History   Socioeconomic History   Marital status: Legally Separated    Spouse name: Not on file   Number of children: 1   Years of education: Not on file   Highest education level: Not on file  Occupational History   Not on file  Tobacco Use   Smoking status: Former    Current packs/day: 0.00    Average packs/day: 1 pack/day for 46.1 years (46.1 ttl pk-yrs)    Types: Cigarettes    Start date: 01/10/1957    Quit date: 02/11/2003    Years since quitting: 20.2    Passive exposure: Past   Smokeless tobacco: Former    Types: Snuff    Quit date: 02/11/2003  Vaping Use   Vaping status: Never Used  Substance and Sexual Activity   Alcohol use: Not Currently    Alcohol/week: 7.0 standard drinks of alcohol    Types: 7 Cans of beer per week   Drug use: No   Sexual activity: Yes    Partners: Female  Other Topics Concern   Not on file  Social History Narrative   Live with grandaughter, April. 1 son that lives in [place].   Social Drivers of Health   Financial  Resource Strain: Medium Risk (04/17/2023)   Received from [facility]   Overall Financial Resource Strain (CARDIA)    Difficulty of Paying Living Expenses: Somewhat hard  Food Insecurity: No Food Insecurity (05/15/2023)   Hunger Vital Sign    Worried About Running Out of Food in the Last Year: Never true    Ran Out of Food in the Last Year: Never true  Recent Concern: Food  Insecurity - Food Insecurity Present (04/17/2023)   Received from [facility]   Hunger Vital Sign    Worried About Running Out of Food in the Last Year: Sometimes true    Ran Out of Food in the Last Year: Sometimes true  Transportation Needs: No Transportation Needs (05/15/2023)   PRAPARE - Administrator, Civil Service (Medical): No    Lack of Transportation (Non-Medical): No  Physical Activity: Not on file  Stress: Not on file  Social Connections: Moderately Isolated (05/15/2023)   Social Connection and Isolation Panel [NHANES]    Frequency of Communication with Friends and Family: More than three times a week    Frequency of Social Gatherings with Friends and Family: More than three times a week    Attends Religious Services: 1 to 4 times per year    Active Member of [employer] or Organizations: No    Attends Engineer, structural: Never    Marital Status: Separated   Family History  Problem Relation Age of Onset   Hypertension Mother    Heart disease Mother    CAD Father    Heart attack Father    Scheduled Meds:  acetaminophen   1,000 mg Oral TID   aspirin  EC  81 mg Oral QPC supper   atorvastatin   80 mg Oral Daily   clopidogrel   75 mg Oral Daily   DULoxetine   30 mg Oral Daily   enoxaparin  (LOVENOX ) injection  40 mg Subcutaneous Q24H   famotidine   20 mg Oral QHS   feeding supplement  237 mL Oral BID BM   finasteride   5 mg Oral Daily   isosorbide  mononitrate  30 mg Oral BID   pantoprazole   40 mg Oral BID   polyethylene glycol  17 g Oral BID   ranolazine   1,000  mg Oral BID   senna-docusate  2 tablet Oral BID   sodium chloride  flush  3 mL Intravenous Q12H   [START ON 05/19/2023] torsemide   20 mg Oral Daily   Continuous Infusions: PRN Meds:.ibuprofen , ipratropium-albuterol , ondansetron  **OR** ondansetron  (ZOFRAN ) IV Allergies  Allergen Reactions   Hydromorphone     Hallucination Pt reports he doesn't know about this   Review of Systems  Constitutional:  Positive for activity change and appetite change. Negative for fatigue.  Gastrointestinal:  Positive for constipation and nausea.    Physical Exam Vitals and nursing note reviewed.  Constitutional:      General: He is not in acute distress. Cardiovascular:     Rate and Rhythm: Bradycardia present.  Pulmonary:     Effort: No tachypnea, accessory muscle usage or respiratory distress.  Abdominal:     Palpations: Abdomen is soft.  Neurological:     Mental Status: He is alert and oriented to person, place, and time.     Vital Signs: BP (!) 113/59 (BP Location: Left Arm)   Pulse (!) 58   Temp 97.7 F (36.5 C) (Oral)   Resp 16   Ht 6' (1.829 m)   Wt 74.7 kg   SpO2 100%   BMI 22.34 kg/m  Pain Scale: 0-10   Pain Score: 0-No pain   SpO2: SpO2: 100 % O2 Device:SpO2: 100 % O2 Flow Rate: .O2 Flow Rate (L/min): 2 L/min  IO: Intake/output summary:  Intake/Output Summary (Last 24 hours) at 05/18/2023 1434 Last data filed at 05/18/2023 0800 Gross per 24 hour  Intake 480 ml  Output 1375 ml  Net -895 ml    LBM: Last  BM Date : 05/15/23 Baseline Weight: Weight: 74.7 kg Most recent weight: Weight: 74.7 kg     Palliative Assessment/Data:     Time Total: 80 min  Greater than 50%  of this time was spent counseling and coordinating care related to the above assessment and plan.  Signed by: Vila Grayer, NP Palliative Medicine Team Pager # [phone] (M-F 8a-5p) Team Phone # [phone] (Nights/Weekends)

## 2023-05-18 NOTE — Progress Notes (Signed)
 PROGRESS NOTE    [name] Browning  XBJ:[id number] DOB: 04-03-1940 DOA: 05/15/2023 PCP: Aisha Hove, MD    Brief Narrative:  83 y.o. male with medical history significant of atrial fibrillation not on anticoagulation, CAD on DAPT, HFpEF, severe COPD with chronic hypoxic respiratory failure no longer on supplemental oxygen , TIA, Hypertension, hyperlipidemia, recurrent UTI with multidrug-resistant E. coli BPH with LUTS, who presents to the ED due to ground-level fall.   [name] Browning states that he was working in his garden when he tripped on a garden hose, landing onto his right side.  He is endorsing significant right pelvis/hip pain with inability to bear weight. He also endorses left shoulder pain and left-sided chest pain but this is less severe.  He denies any acute symptoms prior to the fall, including chest pain, palpitations, dizziness, shortness of breath.   Otherwise, [name] Browning states he has been experiencing dysuria for 2 years with no improvement or change after multiple rounds of antibiotics.  He denies any acute abdominal pain.  5/7: PT and OT recommends SNF but patient/family declined.  Palliative care consult 5/8: Threw up twice overnight, KUB showing no bowel obstruction, stopped narcotics   Assessment & Plan:   Principal Problem:   Ground-level fall Active Problems:   Fracture of multiple pubic rami (HCC)   Pyuria   Essential hypertension, benign   Atrial fibrillation (HCC)   Coronary artery disease   COPD (chronic obstructive pulmonary disease) (HCC)   Chronic diastolic CHF (congestive heart failure) (HCC)  Ground-level fall Patient is presenting with a mechanical ground-level fall, complicated by pubic rami fractures with intractable pain. Plan: PT/OT recommends SNF but patient/family declined Pain control, stop morphine , and oxycodone .  Will try to manage with ibuprofen  or Tylenol  as he is likely struggling with mental status issue and some constipation.  I have added  bowel regimen Will need rolling walker/2 wheels.  Bedside commode/3 and 1  Nausea and vomiting Likely due to some ileus from narcotics We will avoid narcotics and try ibuprofen /Tylenol  for pain control for now KUB shows no bowel obstruction.  Left nephrolithiasis seen   Fracture of multiple pubic rami (HCC) X-ray with acute mildly displaced fractures of the right superior and inferior pubic rami after a mechanical ground-level fall.  Orthopedic surgery has been consulted and recommending weightbearing as tolerated.  Plan: Pain control with ibuprofen  and Tylenol .  Avoid narcotics Weightbearing as tolerated and continue working with therapy  Pyuria Patient endorsing dysuria, however he is adamant it is unchanged for the last 2 years. Multiple rounds of antibiotics by Urology without improvement; recently referred for urodynamic testing  Plan: - Hold off on further antibiotics given no acute symptoms.  KUB shows left nephrolithiasis.  Outpatient urology follow-up   Chronic diastolic CHF (congestive heart failure) (HCC) Patient appears euvolemic at this time.   - Continue home regimen   COPD (chronic obstructive pulmonary disease) (HCC) No shortness of breath reported at this time. May require 2 L oxygen  via nasal cannula.  He desaturated to 82% on ambulation - Continue home regimen.  Will need 2 L oxygen  at discharge for continuous need   Coronary artery disease Chest pain reported, however likely due to fall with overlying ecchymosis at site of pain   - Continue home regimen   Atrial fibrillation (HCC) Patient has a history of atrial fibrillation on metoprolol  for rate control.  He is not on anticoagulation.   - Continue home metoprolol  as allowed by BP   Essential hypertension,  benign - Resume home regimen.  Currently on hold given hypotension   DVT prophylaxis: SQ lovenox  Code Status: FULL Family Communication:Daughter updated at bedside Disposition Plan: Status is:  Observation.  Possible discharge tomorrow depending on clinical condition The patient will require care spanning > 2 midnights and should be moved to inpatient because: Intractable pain in setting of mechanical fall and pubic fracture   Level of care: Med-Surg  Consultants:  Ortho (signed off, no surgical management)    Subjective:  Per family and nursing he threw up twice overnight.  He is nauseous and sleepy as he could not sleep well overnight.  He did not want to eat breakfast this morning.  Objective: Vitals:   05/17/23 1511 05/17/23 2002 05/18/23 0413 05/18/23 0814  BP: 126/69 125/78 118/66 (!) 113/59  Pulse: 68  66 (!) 58  Resp: 16 18 18 16   Temp: (!) 97.5 F (36.4 C) (!) 97.5 F (36.4 C) 98 F (36.7 C) 97.7 F (36.5 C)  TempSrc: Oral Oral Oral Oral  SpO2: 100% 100% 98% 100%  Weight:      Height:        Intake/Output Summary (Last 24 hours) at 05/18/2023 1252 Last data filed at 05/18/2023 0800 Gross per 24 hour  Intake 480 ml  Output 1575 ml  Net -1095 ml   Filed Weights   05/16/23 0328  Weight: 74.7 kg    Examination:  General exam: NAD.  Frail-appearing Respiratory system: Diminished bilaterally.  Normal work of breathing.  Room air Cardiovascular system: S1-S2, RRR, no murmurs, no pedal edema Gastrointestinal system: Soft, NT/ND, normal bowel sounds Central nervous system: Sleepy. No focal neurological deficits. Extremities: Symmetric 5 x 5 power. Skin: No rashes, lesions or ulcers Psychiatry: Judgement and insight appear normal. Mood & affect appropriate.     Data Reviewed: I have personally reviewed following labs and imaging studies  CBC: Recent Labs  Lab 05/15/23 1730 05/16/23 0444 05/18/23 0317  WBC 10.5 7.0 7.5  NEUTROABS 8.7*  --   --   HGB 12.7* 10.8* 10.2*  HCT 39.3 33.0* 31.2*  MCV 94.0 93.8 93.4  PLT 184 131* 141*   Basic Metabolic Panel: Recent Labs  Lab 05/15/23 1730 05/16/23 0444 05/18/23 0317  NA 136 134* 138  K 3.9  3.6 4.0  CL 98 98 101  CO2 27 26 28   GLUCOSE 119* 104* 116*  BUN 24* 28* 32*  CREATININE 1.28* 1.37* 1.34*  CALCIUM  9.2 8.5* 8.5*   GFR: Estimated Creatinine Clearance: 45.7 mL/min (A) (by C-G formula based on SCr of 1.34 mg/dL (H)). Liver Function Tests: Recent Labs  Lab 05/15/23 1730  AST 26  ALT 15  ALKPHOS 142*  BILITOT 1.4*  PROT 7.6  ALBUMIN  4.0         Radiology Studies: DG Abd 1 View Result Date: 05/18/2023 CLINICAL DATA:  Abdominal distension and pain. EXAM: ABDOMEN - 1 VIEW COMPARISON:  November 18, 2020. FINDINGS: No abnormal bowel dilatation is noted. Mild amount of stool seen throughout the colon. Left nephrolithiasis is noted. IMPRESSION: Left nephrolithiasis.  No abnormal bowel dilatation. Electronically Signed   By: Rosalene Colon M.D.   On: 05/18/2023 10:10        Scheduled Meds:  acetaminophen   1,000 mg Oral TID   aspirin  EC  81 mg Oral QPC supper   atorvastatin   80 mg Oral Daily   clopidogrel   75 mg Oral Daily   DULoxetine   30 mg Oral Daily   enoxaparin  (LOVENOX )  injection  40 mg Subcutaneous Q24H   famotidine   20 mg Oral QHS   feeding supplement  237 mL Oral BID BM   finasteride   5 mg Oral Daily   isosorbide  mononitrate  30 mg Oral BID   pantoprazole   40 mg Oral BID   polyethylene glycol  17 g Oral BID   ranolazine   1,000 mg Oral BID   senna-docusate  2 tablet Oral BID   sodium chloride  flush  3 mL Intravenous Q12H   [START ON 05/19/2023] torsemide   20 mg Oral Daily   Continuous Infusions:   LOS: 0 days    Time spent 35 minutes  Brenna Cam, MD [facility]   If 7PM-7AM, please contact night-coverage  05/18/2023, 12:52 PM

## 2023-05-18 NOTE — Progress Notes (Signed)
 Mobility Specialist - Progress Note   05/18/23 1624  Mobility  Activity Refused mobility     Pt politely declined mobility; states he just finished ambulating with OT. Will attempt another date/time.    Searcy Czech Mobility Specialist 05/18/23, 4:24 PM

## 2023-05-19 ENCOUNTER — Other Ambulatory Visit: Payer: Self-pay

## 2023-05-19 DIAGNOSIS — S32511A Fracture of superior rim of right pubis, initial encounter for closed fracture: Secondary | ICD-10-CM | POA: Diagnosis not present

## 2023-05-19 DIAGNOSIS — J42 Unspecified chronic bronchitis: Secondary | ICD-10-CM | POA: Diagnosis not present

## 2023-05-19 DIAGNOSIS — N3001 Acute cystitis with hematuria: Secondary | ICD-10-CM | POA: Diagnosis not present

## 2023-05-19 DIAGNOSIS — W1830XA Fall on same level, unspecified, initial encounter: Secondary | ICD-10-CM | POA: Diagnosis not present

## 2023-05-19 DIAGNOSIS — S32591A Other specified fracture of right pubis, initial encounter for closed fracture: Secondary | ICD-10-CM | POA: Diagnosis not present

## 2023-05-19 DIAGNOSIS — I4811 Longstanding persistent atrial fibrillation: Secondary | ICD-10-CM | POA: Diagnosis not present

## 2023-05-19 LAB — BASIC METABOLIC PANEL WITH GFR
Anion gap: 9 (ref 5–15)
BUN: 29 mg/dL — ABNORMAL HIGH (ref 8–23)
CO2: 28 mmol/L (ref 22–32)
Calcium: 8.8 mg/dL — ABNORMAL LOW (ref 8.9–10.3)
Chloride: 99 mmol/L (ref 98–111)
Creatinine, Ser: 1.38 mg/dL — ABNORMAL HIGH (ref 0.61–1.24)
GFR, Estimated: 51 mL/min — ABNORMAL LOW (ref >60)
Glucose, Bld: 97 mg/dL (ref 70–99)
Potassium: 4.2 mmol/L (ref 3.5–5.1)
Sodium: 136 mmol/L (ref 135–145)

## 2023-05-19 LAB — CBC
HCT: 31.8 % — ABNORMAL LOW (ref 39.0–52.0)
Hemoglobin: 10.3 g/dL — ABNORMAL LOW (ref 13.0–17.0)
MCH: 30.4 pg (ref 26.0–34.0)
MCHC: 32.4 g/dL (ref 30.0–36.0)
MCV: 93.8 fL (ref 80.0–100.0)
Platelets: 146 10*3/uL — ABNORMAL LOW (ref 150–400)
RBC: 3.39 MIL/uL — ABNORMAL LOW (ref 4.22–5.81)
RDW: 13.4 % (ref 11.5–15.5)
WBC: 6.6 10*3/uL (ref 4.0–10.5)
nRBC: 0 % (ref 0.0–0.2)

## 2023-05-19 MED ORDER — SENNOSIDES-DOCUSATE SODIUM 8.6-50 MG PO TABS
2.0000 | ORAL_TABLET | Freq: Every evening | ORAL | 0 refills | Status: AC | PRN
  Filled 2023-05-19: qty 30, 15d supply, fill #0

## 2023-05-19 MED ORDER — ACETAMINOPHEN 500 MG PO TABS
1000.0000 mg | ORAL_TABLET | Freq: Three times a day (TID) | ORAL | 0 refills | Status: DC
  Filled 2023-05-19: qty 30, 5d supply, fill #0

## 2023-05-19 MED ORDER — DULOXETINE HCL 30 MG PO CPEP
30.0000 mg | ORAL_CAPSULE | Freq: Every day | ORAL | 0 refills | Status: DC
  Filled 2023-05-19 (×2): qty 30, 30d supply, fill #0

## 2023-05-19 MED ORDER — FERROUS GLUCONATE 240 (27 FE) MG PO TABS
240.0000 mg | ORAL_TABLET | Freq: Every day | ORAL | 0 refills | Status: AC
  Filled 2023-05-19: qty 30, 30d supply, fill #0

## 2023-05-19 MED ORDER — POLYETHYLENE GLYCOL 3350 17 GM/SCOOP PO POWD
17.0000 g | Freq: Every day | ORAL | 0 refills | Status: AC | PRN
  Filled 2023-05-19: qty 238, 14d supply, fill #0

## 2023-05-19 MED ORDER — IBUPROFEN 400 MG PO TABS
400.0000 mg | ORAL_TABLET | Freq: Four times a day (QID) | ORAL | 0 refills | Status: DC | PRN
  Filled 2023-05-19: qty 30, 8d supply, fill #0

## 2023-05-19 MED ORDER — TORSEMIDE 20 MG PO TABS
20.0000 mg | ORAL_TABLET | Freq: Every day | ORAL | 0 refills | Status: DC
  Filled 2023-05-19 (×2): qty 30, 30d supply, fill #0

## 2023-05-19 NOTE — Plan of Care (Signed)

## 2023-05-19 NOTE — Progress Notes (Signed)
 [facility] Liaison Note:   (new referral for outpatient palliative services)  Notified by [facility], Marcela Senters, RN,   of patient/family request for [facility] Palliative Care services at home after discharge.  Referral submitted today  Please call with any hospice or outpatient palliative care related questions.  Thank you for the opportunity to participate in this patient's care.  Helmut Lobe, [facility] Liaison [phone]

## 2023-05-19 NOTE — TOC Transition Note (Signed)
 Transition of Care [facility]) - Discharge Note   Patient Details  Name: [name] Browning MRN: [id number] Date of Birth: 1940-07-05  [facility] of [facility]) CM/SW Contact:  Crayton Docker, RN 05/19/2023, 3:39 PM   Clinical Narrative:     Discharge orders noted. Outpatient Palliative added. Private transportation arranged. Home oxygen  at 2LPM via [employer].   Final next level of care: Home/Self Care (adding Outpatient Palliative Services) Barriers to Discharge: Continued Medical Work up   Patient Goals and CMS Choice    Home/self care Outpatient Palliative   Discharge Placement    Home/self care            Discharge Plan and Services Additional resources added to the After Visit Summary for     Discharge Planning Services: CM Consult            DME Arranged: Oxygen  DME Agency: [employer], Other - Comment (Home oxygen  at 2LPM continuious)   HH Arranged: PT, OT----patient declined    Social Drivers of Health (SDOH) Interventions SDOH Screenings   Food Insecurity: No Food Insecurity (05/15/2023)  Recent Concern: Food Insecurity - Food Insecurity Present (04/17/2023)   Received from [facility]  Housing: Low Risk  (05/15/2023)  Transportation Needs: No Transportation Needs (05/15/2023)  Utilities: Not At Risk (05/15/2023)  Depression (PHQ2-9): High Risk (11/21/2022)  Financial Resource Strain: Medium Risk (04/17/2023)   Received from [facility]  Social Connections: Moderately Isolated (05/15/2023)  Tobacco Use: Medium Risk (05/16/2023)     Readmission Risk Interventions     No data to display

## 2023-05-20 NOTE — Discharge Summary (Signed)
 Physician Discharge Summary   Patient: [name] Browning MRN: [id number] DOB: [birth year]  Admit date:     05/15/2023  Discharge date: 05/19/2023  Discharge Physician: Brenna Cam   PCP: Aisha Hove, MD   Recommendations at discharge:    F/up with outpt providers as requested  Discharge Diagnoses: Principal Problem:   Ground-level fall Active Problems:   Fracture of multiple pubic rami (HCC)   Pyuria   Essential hypertension, benign   Atrial fibrillation (HCC)   Coronary artery disease   COPD (chronic obstructive pulmonary disease) (HCC)   Chronic diastolic CHF (congestive heart failure) (HCC)   Closed fracture of right superior rim of pubis (HCC)   Acute cystitis with hematuria  Hospital Course: Assessment and Plan:  83 y.o. male with medical history significant of atrial fibrillation not on anticoagulation, CAD on DAPT, HFpEF, severe COPD with chronic hypoxic respiratory failure no longer on supplemental oxygen , TIA, Hypertension, hyperlipidemia, recurrent UTI with multidrug-resistant E. coli BPH with LUTS, who presents to the ED due to ground-level fall.   [name] states that he was working in his garden when he tripped on a garden hose, landing onto his right side.  He is endorsing significant right pelvis/hip pain with inability to bear weight. He also endorses left shoulder pain and left-sided chest pain but this is less severe.  He denies any acute symptoms prior to the fall, including chest pain, palpitations, dizziness, shortness of breath.   Otherwise, [name] states he has been experiencing dysuria for 2 years with no improvement or change after multiple rounds of antibiotics.  He denies any acute abdominal pain.   5/7: PT and OT recommends SNF but patient/family declined.  Palliative care consult 5/8: Threw up twice overnight, KUB showing no bowel obstruction, stopped narcotics   Ground-level fall Patient is presenting with a mechanical ground-level fall, complicated  by pubic rami fractures with intractable pain. PT/OT recommends SNF but patient/family declined Not tolerating Narcotics.  Pain controlled with ibuprofen  or Tylenol   Provided rolling walker/2 wheels.  Bedside commode/3 and 1   Nausea and vomiting Likely due to ileus from narcotics Resolved with avoiding narcotics - ibuprofen /Tylenol  for pain control for now KUB shows no bowel obstruction.  Left nephrolithiasis seen   Fracture of multiple pubic rami (HCC) X-ray with acute mildly displaced fractures of the right superior and inferior pubic rami after a mechanical ground-level fall.  Orthopedic surgery has been consulted and recommending weightbearing as tolerated. Pain control with ibuprofen  and Tylenol .  Avoid narcotics Weightbearing as tolerated and continue working with therapy   Pyuria Patient endorsing dysuria, however he is adamant it is unchanged for the last 2 years. Multiple rounds of antibiotics by Urology without improvement; recently referred for urodynamic testing  Plan: - Hold off on further antibiotics given no acute symptoms.  KUB shows left nephrolithiasis.  Outpatient urology follow-up   Chronic diastolic CHF (congestive heart failure) (HCC) Patient appears euvolemic at this time.   COPD (chronic obstructive pulmonary disease) (HCC) No shortness of breath reported at this time. May require 2 L oxygen  via nasal cannula.  He desaturated to 82% on ambulation - Continue home regimen.  Will need 2 L oxygen  at discharge for continuous need   Coronary artery disease Chest pain reported, however likely due to fall with overlying ecchymosis at site of pain   Atrial fibrillation [facility]) Patient has a history of atrial fibrillation on metoprolol  for rate control.  He is not on anticoagulation.   Essential hypertension,  benign - Resume home regimen at DC       Consultants: Palliative care Disposition: Home health with Palliative care Diet recommendation:  Discharge Diet  Orders (From admission, onward)     Start     Ordered   05/19/23 0000  Diet - low sodium heart healthy        05/19/23 0947           Carb modified diet DISCHARGE MEDICATION: Allergies as of 05/19/2023       Reactions   Hydromorphone     Hallucination Pt reports he doesn't know about this        Medication List     STOP taking these medications    Breztri  Aerosphere 160-9-4.8 MCG/ACT Aero inhaler Generic drug: budeson-glycopyrrolate -formoterol    cetirizine 10 MG tablet Commonly known as: ZYRTEC   famotidine  20 MG tablet Commonly known as: PEPCID    finasteride  5 MG tablet Commonly known as: PROSCAR    hyoscyamine  0.125 MG Tbdp disintergrating tablet Commonly known as: ANASPAZ    isosorbide  mononitrate 30 MG 24 hr tablet Commonly known as: IMDUR    losartan  50 MG tablet Commonly known as: COZAAR    methenamine  1 g tablet Commonly known as: Hiprex    montelukast  10 MG tablet Commonly known as: SINGULAIR    ondansetron  4 MG disintegrating tablet Commonly known as: ZOFRAN -ODT   pantoprazole  40 MG tablet Commonly known as: PROTONIX    ranolazine  1000 MG SR tablet Commonly known as: RANEXA    roflumilast  500 MCG Tabs tablet Commonly known as: DALIRESP    tamsulosin  0.4 MG Caps capsule Commonly known as: FLOMAX    Uribel  118 MG Caps       TAKE these medications    Acetaminophen  Extra Strength 500 MG Tabs Take 2 tablets (1,000 mg total) by mouth 3 (three) times daily. What changed:  when to take this reasons to take this   aspirin  EC 81 MG tablet Take 81 mg by mouth daily after supper.   atorvastatin  80 MG tablet Commonly known as: Lipitor  Take 1 tablet (80 mg total) by mouth daily.   clopidogrel  75 MG tablet Commonly known as: PLAVIX  TAKE 1 TABLET BY MOUTH EVERY DAY   DULoxetine  30 MG capsule Commonly known as: CYMBALTA  Take 1 capsule (30 mg total) by mouth daily. What changed: how much to take   Ferate 240 (27 Fe) MG tablet Generic drug:  ferrous gluconate  Take 1 tablet (240 mg total) by mouth daily. What changed: when to take this   ibuprofen  400 MG tablet Commonly known as: ADVIL  Take 1 tablet (400 mg total) by mouth every 6 (six) hours as needed for headache, mild pain (pain score 1-3), moderate pain (pain score 4-6), cramping or fever.   Klor-Con  M20 20 MEQ tablet Generic drug: potassium chloride  SA TAKE 1 TABLET BY MOUTH EVERY DAY   metoprolol  succinate 25 MG 24 hr tablet Commonly known as: Toprol  XL Take 1 tablet (25 mg total) by mouth daily.   Ohtuvayre  3 MG/2.5ML Susp Generic drug: Ensifentrine  Inhale 2.5 mLs into the lungs 2 (two) times daily.   polyethylene glycol powder 17 GM/SCOOP powder Commonly known as: GLYCOLAX /MIRALAX  Take 17 g by mouth daily as needed.   Stool Softener/Laxative 50-8.6 MG tablet Generic drug: senna-docusate Take 2 tablets by mouth at bedtime as needed for mild constipation.   torsemide  20 MG tablet Commonly known as: DEMADEX  Take 1 tablet (20 mg total) by mouth daily. What changed: how much to take   Vios LC Plus Misc  Follow-up Information     [facility] Follow up.   Why: 05/08--- Contact information: [address] [place] [address] [phone]         Aisha Hove, MD. Schedule an appointment as soon as possible for a visit in 1 week(s).   Specialty: Internal Medicine Why: [facility] Discharge F/UP Contact information: [address] [place] [address] [phone]         Lorri Rota, MD. Schedule an appointment as soon as possible for a visit in 2 week(s).   Specialty: Orthopedic Surgery Why: Dr. Basilio Both office will call patient directly to schedule appt  [facility] Discharge F/UP  Call [phone] to make an appointment   NS called and was told they were unable to make an appointment without knowing which body part was affect, NS stated it was R hip Contact information: [address] [place] [address] [phone]                Discharge Exam: Filed Weights   05/16/23 0328  Weight: 74.7 kg   General exam: NAD.  Frail-appearing Respiratory system: Diminished bilaterally.  Normal work of breathing.  Room air Cardiovascular system: S1-S2, RRR, no murmurs, no pedal edema Gastrointestinal system: Soft, NT/ND, normal bowel sounds Central nervous system: Sleepy. No focal neurological deficits. Extremities: Symmetric 5 x 5 power. Skin: No rashes, lesions or ulcers Psychiatry: Judgement and insight appear normal. Mood & affect appropriate.   Condition at discharge: fair  The results of significant diagnostics from this hospitalization (including imaging, microbiology, ancillary and laboratory) are listed below for reference.   Imaging Studies: DG Abd 1 View Result Date: 05/18/2023 CLINICAL DATA:  Abdominal distension and pain. EXAM: ABDOMEN - 1 VIEW COMPARISON:  November 18, 2020. FINDINGS: No abnormal bowel dilatation is noted. Mild amount of stool seen throughout the colon. Left nephrolithiasis is noted. IMPRESSION: Left nephrolithiasis.  No abnormal bowel dilatation. Electronically Signed   By: Rosalene Colon M.D.   On: 05/18/2023 10:10   DG Chest 2 View Result Date: 05/15/2023 CLINICAL DATA:  Chest pain EXAM: CHEST - 2 VIEW COMPARISON:  Chest x-ray 07/03/2022 FINDINGS: The heart is mildly enlarged, unchanged. There is chronic. Mild diffuse interstitial prominence. There is no evidence for central pulmonary vascular congestion. There are minimal strandy and patchy opacities in the right lung base favored as scarring or atelectasis. There is no pleural effusion or pneumothorax. No acute fractures are seen. IMPRESSION: 1. Minimal strandy and patchy opacities in the right lung base favored as scarring or atelectasis. 2. Mild cardiomegaly. Electronically Signed   By: Tyron Gallon M.D.   On: 05/15/2023 19:02   DG Hip Unilat With Pelvis 2-3 Views Right Result  Date: 05/15/2023 CLINICAL DATA:  Fall outside today.  Right hip pain. EXAM: DG HIP (WITH OR WITHOUT PELVIS) 2-3V RIGHT COMPARISON:  None Available. FINDINGS: Acute mildly displaced fractures of the right superior and inferior pubic rami approaching the pubic symphysis. No pubic symphyseal widening. No additional fracture of the hip. Femoral head is well seated. Mild right hip degenerative change with joint space narrowing and spurring. No evidence of erosion or avascular necrosis. The sacroiliac joints are congruent. IMPRESSION: 1. Acute mildly displaced fractures of the right superior and inferior pubic rami approaching the pubic symphysis. 2. Mild right hip osteoarthritis. Electronically Signed   By: Chadwick Colonel M.D.   On: 05/15/2023 17:56   DG Shoulder Left Result Date: 05/15/2023 CLINICAL DATA:  Fall  outside today.  Left shoulder pain. EXAM: LEFT SHOULDER - 2+ VIEW COMPARISON:  None Available. FINDINGS: There is no evidence of fracture or dislocation. Mild acromioclavicular degenerative change. No erosions or suspicious bone lesion. Soft tissues are unremarkable. Remote healed fractures of left ribs, no evidence of acute rib fracture. IMPRESSION: 1. No fracture or dislocation of the left shoulder. 2. Mild acromioclavicular degenerative change. Electronically Signed   By: Chadwick Colonel M.D.   On: 05/15/2023 17:55   CT Head Wo Contrast Result Date: 05/15/2023 CLINICAL DATA:  Head trauma, trip and fall EXAM: CT HEAD WITHOUT CONTRAST CT CERVICAL SPINE WITHOUT CONTRAST TECHNIQUE: Multidetector CT imaging of the head and cervical spine was performed following the standard protocol without intravenous contrast. Multiplanar CT image reconstructions of the cervical spine were also generated. RADIATION DOSE REDUCTION: This exam was performed according to the departmental dose-optimization program which includes automated exposure control, adjustment of the mA and/or kV according to patient size and/or use of  iterative reconstruction technique. COMPARISON:  11/20/2010 FINDINGS: CT HEAD FINDINGS Brain: No evidence of acute infarction, hemorrhage, hydrocephalus, extra-axial collection or mass lesion/mass effect. Vascular: No hyperdense vessel or unexpected calcification. Skull: Normal. Negative for fracture or focal lesion. Sinuses/Orbits: No acute finding. Other: None. CT CERVICAL SPINE FINDINGS Alignment: Normal. Skull base and vertebrae: No acute fracture. No primary bone lesion or focal pathologic process. Soft tissues and spinal canal: No prevertebral fluid or swelling. No visible canal hematoma. Disc levels: Focally moderate disc space height loss and osteophytosis of C5-C6 with otherwise mild disc degenerative change of the cervical spine. Upper chest: Negative. Other: None. IMPRESSION: 1. No acute intracranial pathology. 2. No fracture or static subluxation of the cervical spine. 3. Focally moderate disc space height loss and osteophytosis of C5-C6 with otherwise mild disc degenerative change of the cervical spine. Electronically Signed   By: Fredricka Jenny M.D.   On: 05/15/2023 17:17   CT Cervical Spine Wo Contrast Result Date: 05/15/2023 CLINICAL DATA:  Head trauma, trip and fall EXAM: CT HEAD WITHOUT CONTRAST CT CERVICAL SPINE WITHOUT CONTRAST TECHNIQUE: Multidetector CT imaging of the head and cervical spine was performed following the standard protocol without intravenous contrast. Multiplanar CT image reconstructions of the cervical spine were also generated. RADIATION DOSE REDUCTION: This exam was performed according to the departmental dose-optimization program which includes automated exposure control, adjustment of the mA and/or kV according to patient size and/or use of iterative reconstruction technique. COMPARISON:  11/20/2010 FINDINGS: CT HEAD FINDINGS Brain: No evidence of acute infarction, hemorrhage, hydrocephalus, extra-axial collection or mass lesion/mass effect. Vascular: No hyperdense vessel  or unexpected calcification. Skull: Normal. Negative for fracture or focal lesion. Sinuses/Orbits: No acute finding. Other: None. CT CERVICAL SPINE FINDINGS Alignment: Normal. Skull base and vertebrae: No acute fracture. No primary bone lesion or focal pathologic process. Soft tissues and spinal canal: No prevertebral fluid or swelling. No visible canal hematoma. Disc levels: Focally moderate disc space height loss and osteophytosis of C5-C6 with otherwise mild disc degenerative change of the cervical spine. Upper chest: Negative. Other: None. IMPRESSION: 1. No acute intracranial pathology. 2. No fracture or static subluxation of the cervical spine. 3. Focally moderate disc space height loss and osteophytosis of C5-C6 with otherwise mild disc degenerative change of the cervical spine. Electronically Signed   By: Fredricka Jenny M.D.   On: 05/15/2023 17:17    Microbiology: Results for orders placed or performed in visit on 02/21/23  CULTURE, URINE COMPREHENSIVE     Status: None  Collection Time: 02/21/23  9:27 AM   Specimen: Urine   UR  Result Value Ref Range Status   Urine Culture, Comprehensive Final report  Final   Organism ID, Bacteria Comment  Final    Comment: No growth in 36 - 48 hours.  Microscopic Examination     Status: Abnormal   Collection Time: 02/21/23  9:27 AM   Urine  Result Value Ref Range Status   WBC, UA 11-30 (A) 0 - 5 /hpf Final   RBC, Urine 0-2 0 - 2 /hpf Final   Epithelial Cells (non renal) 0-10 0 - 10 /hpf Final   Casts Present (A) None seen /lpf Final   Cast Type Hyaline casts N/A Final   Bacteria, UA Few None seen/Few Final    Labs: CBC: Recent Labs  Lab 05/15/23 1730 05/16/23 0444 05/18/23 0317 05/19/23 0314  WBC 10.5 7.0 7.5 6.6  NEUTROABS 8.7*  --   --   --   HGB 12.7* 10.8* 10.2* 10.3*  HCT 39.3 33.0* 31.2* 31.8*  MCV 94.0 93.8 93.4 93.8  PLT 184 131* 141* 146*   Basic Metabolic Panel: Recent Labs  Lab 05/15/23 1730 05/16/23 0444 05/18/23 0317  05/19/23 0314  NA 136 134* 138 136  K 3.9 3.6 4.0 4.2  CL 98 98 101 99  CO2 27 26 28 28   GLUCOSE 119* 104* 116* 97  BUN 24* 28* 32* 29*  CREATININE 1.28* 1.37* 1.34* 1.38*  CALCIUM  9.2 8.5* 8.5* 8.8*   Liver Function Tests: Recent Labs  Lab 05/15/23 1730  AST 26  ALT 15  ALKPHOS 142*  BILITOT 1.4*  PROT 7.6  ALBUMIN  4.0   CBG: No results for input(s): "GLUCAP" in the last 168 hours.  Discharge time spent: greater than 30 minutes.  Signed: Brenna Cam, MD [facility] 05/20/2023

## 2023-06-06 DIAGNOSIS — S32501S Unspecified fracture of right pubis, sequela: Secondary | ICD-10-CM | POA: Diagnosis not present

## 2023-06-06 DIAGNOSIS — S62631D Displaced fracture of distal phalanx of left index finger, subsequent encounter for fracture with routine healing: Secondary | ICD-10-CM | POA: Diagnosis not present

## 2023-06-13 ENCOUNTER — Encounter: Payer: Self-pay | Admitting: Cardiovascular Disease

## 2023-06-13 ENCOUNTER — Ambulatory Visit (INDEPENDENT_AMBULATORY_CARE_PROVIDER_SITE_OTHER): Admitting: Cardiovascular Disease

## 2023-06-13 VITALS — BP 120/60 | HR 80 | Ht 74.0 in | Wt 160.2 lb

## 2023-06-13 DIAGNOSIS — I4891 Unspecified atrial fibrillation: Secondary | ICD-10-CM | POA: Diagnosis not present

## 2023-06-13 DIAGNOSIS — I10 Essential (primary) hypertension: Secondary | ICD-10-CM

## 2023-06-13 DIAGNOSIS — I5033 Acute on chronic diastolic (congestive) heart failure: Secondary | ICD-10-CM | POA: Diagnosis not present

## 2023-06-13 DIAGNOSIS — R0789 Other chest pain: Secondary | ICD-10-CM | POA: Diagnosis not present

## 2023-06-13 MED ORDER — ISOSORBIDE MONONITRATE ER 30 MG PO TB24: 30.0000 mg | ORAL_TABLET | Freq: Every day | ORAL | 1 refills | Status: DC

## 2023-06-13 NOTE — Progress Notes (Signed)
 Cardiology Office Note   Date:  06/13/2023   ID:  [name] Browning, DOB 12-Oct-1940, MRN [id number]  PCP:  Aisha Hove, MD  Cardiologist:  Debborah Fairly, MD      History of Present Illness: [name] Browning is a 83 y.o. male who presents for  Chief Complaint  Patient presents with   Follow-up    Hospital follow up    Chest Pain  This is a new problem. The current episode started today. The problem has been waxing and waning. The pain is present in the lateral region. The pain is at a severity of 4/10. Associated symptoms include shortness of breath. Pertinent negatives include no leg pain.      Past Medical History:  Diagnosis Date   Anginal pain (HCC)    Anxiety    Aortic atherosclerosis (HCC)    Atrial fibrillation (HCC) 01/2019   Bilateral carpal tunnel syndrome    CAD (coronary artery disease)    a.) LHC 2004 --> normal coronaries. b.) normal stress test in 2007 and 2011; c.) Lexiscan 05/20/2014 --> LVEF 55-65%; no significant stress induced ischemia/arrythmia. d.) CT chest 03/25/2019 --> coronaries carcified.   Carotid atherosclerosis, bilateral    Carpal tunnel syndrome, bilateral    CHF (congestive heart failure) (HCC)    Chronic anticoagulation    a.) ASA + apixaban    COPD (chronic obstructive pulmonary disease) (HCC)    CVA (cerebral vascular accident) (HCC)    Degenerative disc disease, cervical    Diastolic dysfunction    a.) TTE 05/29/2014 --> LVEF 60-65%; G1DD.   DVT (deep venous thrombosis) (HCC)    GERD (gastroesophageal reflux disease)    History of 2019 novel coronavirus disease (COVID-19) 02/08/2019   HLD (hyperlipidemia)    Hypertension    Kidney stones    Osteoarthritis of right shoulder    Pneumonia    Respiratory failure, acute (HCC) 09/20/2020   a.) severe respiratory distress 1 hour after urological surgery. CXR (+) for acute pulmonary edema. Transferred to ICU and placed on NIPPV. Questionable aspiration PNA. (+) A.fib with RVR. Improved with  ABX, diuresis, and amiodarone .   Skin cancer of face    a.) RIGHT ear and RIGHT forehead; excised.   Syncope    TIA (transient ischemic attack) 2016   Valvular regurgitation    a.) TTE 05/26/2014 --> LVEF 60-65%; trivial MR, mild TR; no AR or PR. b.) TTE 04/20/2015 --> LVEF 55-60%; trivial MR and PR; no AR or TR.     Past Surgical History:  Procedure Laterality Date   CARDIAC CATHETERIZATION  2004   CARPAL TUNNEL RELEASE Right 06/11/2013   CENTRAL LINE INSERTION  11/14/2020   Procedure: CENTRAL LINE INSERTION;  Surgeon: Arleen Lacer, MD;  Location: [facility] INVASIVE CV LAB;  Service: Cardiovascular;;   CORONARY/GRAFT ACUTE MI REVASCULARIZATION N/A 11/14/2020   Procedure: Coronary/Graft Acute MI Revascularization;  Surgeon: Arleen Lacer, MD;  Location: [facility] INVASIVE CV LAB;  Service: Cardiovascular;  Laterality: N/A;   CYSTOSCOPY W/ RETROGRADES  10/16/2020   Procedure: CYSTOSCOPY WITH RETROGRADE PYELOGRAM;  Surgeon: Lawerence Pressman, MD;  Location: [facility] ORS;  Service: Urology;;   CYSTOSCOPY W/ URETERAL STENT PLACEMENT Left 09/20/2020   Procedure: CYSTOSCOPY WITH RETROGRADE PYELOGRAM/URETERAL STENT PLACEMENT;  Surgeon: Lahoma Pigg, MD;  Location: [facility] ORS;  Service: Urology;  Laterality: Left;   CYSTOSCOPY/URETEROSCOPY/HOLMIUM LASER/STENT PLACEMENT Left 10/16/2020   Procedure: CYSTOSCOPY/URETEROSCOPY/HOLMIUM LASER/STENT PLACEMENT;  Surgeon: Lawerence Pressman, MD;  Location: [facility] ORS;  Service:  Urology;  Laterality: Left;   ESOPHAGOGASTRODUODENOSCOPY N/A 11/06/2020   Procedure: ESOPHAGOGASTRODUODENOSCOPY (EGD);  Surgeon: Marnee Sink, MD;  Location: [facility] ENDOSCOPY;  Service: Endoscopy;  Laterality: N/A;   LEFT HEART CATH AND CORONARY ANGIOGRAPHY N/A 11/14/2020   Procedure: LEFT HEART CATH AND CORONARY ANGIOGRAPHY;  Surgeon: Arleen Lacer, MD;  Location: [facility] INVASIVE CV LAB;  Service: Cardiovascular;  Laterality: N/A;   LEFT HEART CATH AND CORONARY ANGIOGRAPHY Left 01/28/2022   Procedure:  LEFT HEART CATH AND CORONARY ANGIOGRAPHY;  Surgeon: Cherrie Cornwall, MD;  Location: [facility] INVASIVE CV LAB;  Service: Cardiovascular;  Laterality: Left;   SHOULDER ARTHROSCOPY WITH OPEN ROTATOR CUFF REPAIR Right 08/24/2017   Procedure: SHOULDER ARTHROSCOPY WITH OPEN ROTATOR CUFF REPAIR;  Surgeon: Elner Hahn, MD;  Location: [facility] ORS;  Service: Orthopedics;  Laterality: Right;   SKIN CANCER EXCISION  12/01/2016   right ear    SKIN CANCER EXCISION     remove from the right side of the face    THROMBECTOMY Right 2004   leg   THYROIDECTOMY  1950   Not sure if total or partial thyroidectomy.     Current Outpatient Medications  Medication Sig Dispense Refill   acetaminophen  (TYLENOL ) 500 MG tablet Take 2 tablets (1,000 mg total) by mouth 3 (three) times daily. 30 tablet 0   aspirin  EC 81 MG tablet Take 81 mg by mouth daily after supper.     atorvastatin  (LIPITOR ) 80 MG tablet Take 1 tablet (80 mg total) by mouth daily. 90 tablet 3   clopidogrel  (PLAVIX ) 75 MG tablet TAKE 1 TABLET BY MOUTH EVERY DAY 90 tablet 3   DULoxetine  (CYMBALTA ) 30 MG capsule Take 1 capsule (30 mg total) by mouth daily. 30 capsule 0   Ensifentrine  (OHTUVAYRE ) 3 MG/2.5ML SUSP Inhale 2.5 mLs into the lungs 2 (two) times daily.     ferrous gluconate  (FERGON) 240 (27 FE) MG tablet Take 1 tablet (240 mg total) by mouth daily. 30 tablet 0   ibuprofen  (ADVIL ) 400 MG tablet Take 1 tablet (400 mg total) by mouth every 6 (six) hours as needed for headache, mild pain (pain score 1-3), moderate pain (pain score 4-6), cramping or fever. 30 tablet 0   isosorbide  mononitrate (IMDUR ) 30 MG 24 hr tablet Take 1 tablet (30 mg total) by mouth daily. 30 tablet 1   KLOR-CON  M20 20 MEQ tablet TAKE 1 TABLET BY MOUTH EVERY DAY 90 tablet 3   metoprolol  succinate (TOPROL  XL) 25 MG 24 hr tablet Take 1 tablet (25 mg total) by mouth daily. 30 tablet 11   Nebulizers (VIOS LC PLUS) MISC      polyethylene glycol powder (GLYCOLAX /MIRALAX ) 17 GM/SCOOP powder  Take 17 g by mouth daily as needed. 238 g 0   senna-docusate (SENOKOT-S) 8.6-50 MG tablet Take 2 tablets by mouth at bedtime as needed for mild constipation. 30 tablet 0   torsemide  (DEMADEX ) 20 MG tablet Take 1 tablet (20 mg total) by mouth daily. 30 tablet 0   No current facility-administered medications for this visit.    Allergies:   Hydromorphone     Social History:   reports that he quit smoking about 20 years ago. His smoking use included cigarettes. He started smoking about 66 years ago. He has a 46.1 pack-year smoking history. He has been exposed to tobacco smoke. He quit smokeless tobacco use about 20 years ago.  His smokeless tobacco use included snuff. He reports that he does not currently use alcohol after a past usage  of about 7.0 standard drinks of alcohol per week. He reports that he does not use drugs.   Family History:  family history includes CAD in his father; Heart attack in his father; Heart disease in his mother; Hypertension in his mother.    ROS:     Review of Systems  Constitutional: Negative.   HENT: Negative.    Eyes: Negative.   Respiratory:  Positive for shortness of breath.   Cardiovascular:  Positive for chest pain.  Gastrointestinal: Negative.   Genitourinary: Negative.   Musculoskeletal: Negative.   Skin: Negative.   Neurological: Negative.   Endo/Heme/Allergies: Negative.   Psychiatric/Behavioral: Negative.    All other systems reviewed and are negative.     All other systems are reviewed and negative.    PHYSICAL EXAM: VS:  BP 120/60   Pulse 80   Ht 6\' 2"  (1.88 m)   Wt 160 lb 3.2 oz (72.7 kg)   SpO2 96%   BMI 20.57 kg/m  , BMI Body mass index is 20.57 kg/m. Last weight:  Wt Readings from Last 3 Encounters:  06/13/23 160 lb 3.2 oz (72.7 kg)  05/16/23 164 lb 10.9 oz (74.7 kg)  03/17/23 164 lb (74.4 kg)     Physical Exam Vitals reviewed.  Constitutional:      Appearance: Normal appearance. He is normal weight.  HENT:     Head:  Normocephalic.     Nose: Nose normal.     Mouth/Throat:     Mouth: Mucous membranes are moist.  Eyes:     Pupils: Pupils are equal, round, and reactive to light.  Cardiovascular:     Rate and Rhythm: Normal rate and regular rhythm.     Pulses: Normal pulses.     Heart sounds: Normal heart sounds.  Pulmonary:     Effort: Pulmonary effort is normal.  Abdominal:     General: Abdomen is flat. Bowel sounds are normal.  Musculoskeletal:        General: Normal range of motion.     Cervical back: Normal range of motion.  Skin:    General: Skin is warm.  Neurological:     General: No focal deficit present.     Mental Status: He is alert.  Psychiatric:        Mood and Affect: Mood normal.       EKG: atrial fib  no acute changes  Recent Labs: 05/15/2023: ALT 15 05/19/2023: BUN 29; Creatinine, Ser 1.38; Hemoglobin 10.3; Platelets 146; Potassium 4.2; Sodium 136    Lipid Panel    Component Value Date/Time   CHOL 283 (H) 11/21/2022 1030   TRIG 98 11/21/2022 1030   HDL 45 11/21/2022 1030   CHOLHDL 3.7 11/12/2020 0435   VLDL 9 11/12/2020 0435   LDLCALC 221 (H) 11/21/2022 1030   LDLCALC 105 (H) 12/07/2016 0454      Other studies Reviewed: Additional studies/ records that were reviewed today include:  Review of the above records demonstrates:       No data to display            ASSESSMENT AND PLAN:    ICD-10-CM   1. Other chest pain  R07.89 MYOCARDIAL PERFUSION IMAGING    isosorbide  mononitrate (IMDUR ) 30 MG 24 hr tablet   jas chest pain , reschedual stress test    2. New onset atrial fibrillation (HCC)  I48.91 MYOCARDIAL PERFUSION IMAGING    isosorbide  mononitrate (IMDUR ) 30 MG 24 hr tablet   still in afib rate  well controlled 73/min    3. Essential hypertension, benign  I10 MYOCARDIAL PERFUSION IMAGING    isosorbide  mononitrate (IMDUR ) 30 MG 24 hr tablet    4. Acute on chronic diastolic CHF (congestive heart failure) (HCC)  I50.33 MYOCARDIAL PERFUSION IMAGING     isosorbide  mononitrate (IMDUR ) 30 MG 24 hr tablet       Problem List Items Addressed This Visit       Cardiovascular and Mediastinum   Essential hypertension, benign   Relevant Medications   isosorbide  mononitrate (IMDUR ) 30 MG 24 hr tablet   Other Relevant Orders   MYOCARDIAL PERFUSION IMAGING   New onset atrial fibrillation (HCC)   Relevant Medications   isosorbide  mononitrate (IMDUR ) 30 MG 24 hr tablet   Other Relevant Orders   MYOCARDIAL PERFUSION IMAGING   Acute on chronic diastolic CHF (congestive heart failure) (HCC)   Relevant Medications   isosorbide  mononitrate (IMDUR ) 30 MG 24 hr tablet   Other Relevant Orders   MYOCARDIAL PERFUSION IMAGING     Other   Chest pain - Primary   Relevant Medications   isosorbide  mononitrate (IMDUR ) 30 MG 24 hr tablet   Other Relevant Orders   MYOCARDIAL PERFUSION IMAGING       Disposition:   Return in about 2 weeks (around 06/27/2023) for Reschedual stress test and f/u.    Total time spent: 35//??????????????????/?????????????????????????????????????????????????????????????????? minutes  Signed,  Debborah Fairly, MD  06/13/2023 9:48 AM    [facility]

## 2023-06-17 ENCOUNTER — Other Ambulatory Visit: Payer: Self-pay | Admitting: Cardiovascular Disease

## 2023-06-17 ENCOUNTER — Other Ambulatory Visit: Payer: Self-pay | Admitting: Family

## 2023-06-17 DIAGNOSIS — I2510 Atherosclerotic heart disease of native coronary artery without angina pectoris: Secondary | ICD-10-CM

## 2023-06-17 DIAGNOSIS — R079 Chest pain, unspecified: Secondary | ICD-10-CM

## 2023-06-19 ENCOUNTER — Other Ambulatory Visit: Payer: Self-pay | Admitting: Medical Genetics

## 2023-06-19 DIAGNOSIS — S32511A Fracture of superior rim of right pubis, initial encounter for closed fracture: Secondary | ICD-10-CM | POA: Diagnosis not present

## 2023-06-22 ENCOUNTER — Ambulatory Visit: Admitting: Cardiovascular Disease

## 2023-06-25 ENCOUNTER — Emergency Department
Admission: EM | Admit: 2023-06-25 | Discharge: 2023-06-25 | Disposition: A | Discharge status: Home / Self Care | Attending: Emergency Medicine | Admitting: Emergency Medicine

## 2023-06-25 ENCOUNTER — Emergency Department

## 2023-06-25 ENCOUNTER — Other Ambulatory Visit: Payer: Self-pay

## 2023-06-25 DIAGNOSIS — R112 Nausea with vomiting, unspecified: Secondary | ICD-10-CM | POA: Insufficient documentation

## 2023-06-25 DIAGNOSIS — N200 Calculus of kidney: Secondary | ICD-10-CM | POA: Diagnosis not present

## 2023-06-25 DIAGNOSIS — I700 Atherosclerosis of aorta: Secondary | ICD-10-CM | POA: Diagnosis not present

## 2023-06-25 DIAGNOSIS — I2510 Atherosclerotic heart disease of native coronary artery without angina pectoris: Secondary | ICD-10-CM | POA: Insufficient documentation

## 2023-06-25 DIAGNOSIS — R300 Dysuria: Secondary | ICD-10-CM | POA: Diagnosis not present

## 2023-06-25 DIAGNOSIS — R531 Weakness: Secondary | ICD-10-CM | POA: Insufficient documentation

## 2023-06-25 DIAGNOSIS — J439 Emphysema, unspecified: Secondary | ICD-10-CM | POA: Diagnosis not present

## 2023-06-25 DIAGNOSIS — J449 Chronic obstructive pulmonary disease, unspecified: Secondary | ICD-10-CM | POA: Diagnosis not present

## 2023-06-25 DIAGNOSIS — K828 Other specified diseases of gallbladder: Secondary | ICD-10-CM | POA: Diagnosis not present

## 2023-06-25 DIAGNOSIS — I959 Hypotension, unspecified: Secondary | ICD-10-CM | POA: Diagnosis not present

## 2023-06-25 DIAGNOSIS — K5730 Diverticulosis of large intestine without perforation or abscess without bleeding: Secondary | ICD-10-CM | POA: Diagnosis not present

## 2023-06-25 DIAGNOSIS — R059 Cough, unspecified: Secondary | ICD-10-CM | POA: Diagnosis not present

## 2023-06-25 LAB — CBC
HCT: 39.8 % (ref 39.0–52.0)
Hemoglobin: 13.1 g/dL (ref 13.0–17.0)
MCH: 30.7 pg (ref 26.0–34.0)
MCHC: 32.9 g/dL (ref 30.0–36.0)
MCV: 93.2 fL (ref 80.0–100.0)
Platelets: 210 10*3/uL (ref 150–400)
RBC: 4.27 MIL/uL (ref 4.22–5.81)
RDW: 13.4 % (ref 11.5–15.5)
WBC: 12.8 10*3/uL — ABNORMAL HIGH (ref 4.0–10.5)
nRBC: 0 % (ref 0.0–0.2)

## 2023-06-25 LAB — COMPREHENSIVE METABOLIC PANEL WITH GFR
ALT: 16 U/L (ref 0–44)
AST: 26 U/L (ref 15–41)
Albumin: 4.2 g/dL (ref 3.5–5.0)
Alkaline Phosphatase: 161 U/L — ABNORMAL HIGH (ref 38–126)
Anion gap: 10 (ref 5–15)
BUN: 21 mg/dL (ref 8–23)
CO2: 29 mmol/L (ref 22–32)
Calcium: 9.6 mg/dL (ref 8.9–10.3)
Chloride: 100 mmol/L (ref 98–111)
Creatinine, Ser: 1.38 mg/dL — ABNORMAL HIGH (ref 0.61–1.24)
GFR, Estimated: 51 mL/min — ABNORMAL LOW (ref >60)
Glucose, Bld: 112 mg/dL — ABNORMAL HIGH (ref 70–99)
Potassium: 3.5 mmol/L (ref 3.5–5.1)
Sodium: 139 mmol/L (ref 135–145)
Total Bilirubin: 2.1 mg/dL — ABNORMAL HIGH (ref 0.0–1.2)
Total Protein: 8.1 g/dL (ref 6.5–8.1)

## 2023-06-25 LAB — URINALYSIS, ROUTINE W REFLEX MICROSCOPIC
Bilirubin Urine: NEGATIVE
Glucose, UA: NEGATIVE mg/dL
Hgb urine dipstick: NEGATIVE
Ketones, ur: NEGATIVE mg/dL
Nitrite: NEGATIVE
Protein, ur: NEGATIVE mg/dL
Specific Gravity, Urine: 1.005 (ref 1.005–1.030)
WBC, UA: >50 WBC/hpf (ref 0–5)
pH: 6 (ref 5.0–8.0)

## 2023-06-25 LAB — LIPASE, BLOOD: Lipase: 29 U/L (ref 11–51)

## 2023-06-25 MED ORDER — ONDANSETRON 4 MG PO TBDP: 4.0000 mg | ORAL_TABLET | Freq: Three times a day (TID) | ORAL | 0 refills | Status: DC | PRN

## 2023-06-25 MED ORDER — SODIUM CHLORIDE 0.9 % IV SOLN
1.0000 g | Freq: Once | INTRAVENOUS | Status: AC
  Administered 2023-06-25: 1 g via INTRAVENOUS
  Filled 2023-06-25: qty 10

## 2023-06-25 MED ORDER — IOHEXOL 300 MG/ML  SOLN
100.0000 mL | Freq: Once | INTRAMUSCULAR | Status: AC | PRN
Start: 2023-06-25 — End: 2023-06-25
  Administered 2023-06-25: 100 mL via INTRAVENOUS

## 2023-06-25 MED ORDER — CEFUROXIME AXETIL 250 MG PO TABS: 250.0000 mg | ORAL_TABLET | Freq: Two times a day (BID) | ORAL | 0 refills | Status: AC

## 2023-06-25 MED ORDER — SODIUM CHLORIDE 0.9 % IV BOLUS
500.0000 mL | Freq: Once | INTRAVENOUS | Status: AC
  Administered 2023-06-25: 500 mL via INTRAVENOUS

## 2023-06-25 NOTE — ED Provider Notes (Signed)
 [facility] Provider Note    Event Date/Time   First MD Initiated Contact with Patient 06/25/23 1512     (approximate)   History   Weakness   HPI  [name] Browning is a 83 y.o. male who presents to the ED for evaluation of Weakness   Review of medical DC summary from 1 month ago, admitted after a fall causing pubic rami fractures.  History of A-fib not on anticoagulation, is on DAPT for CAD, history of COPD.  History of BPH with recurrent UTIs and multidrug-resistant E. coli in the past. Review of urine cultures, clear in February, December and November of '24 had E. coli with multiple resistances.  Patient presents to the ED for evaluation of weakness, presyncope, nausea and emesis.  2 days of nausea.  Went to [employer] this morning after walking inside to get a motorized wheelchair, developed presyncopal dizziness, generalized weakness and a single episode of emesis.  No abdominal pain.  Generalized weakness.  Constant 2 years of dysuria without acute changes.  Denies any primary abdominal pain, but when I am examining him he clearly has abdominal tenderness.  Reports it only hurts when I am touching him.  No stool changes.  Reports a normal stool this morning.   Physical Exam   Triage Vital Signs: ED Triage Vitals [06/25/23 1358]  Encounter Vitals Group     BP 131/70     Girls Systolic BP Percentile      Girls Diastolic BP Percentile      Boys Systolic BP Percentile      Boys Diastolic BP Percentile      Pulse Rate 64     Resp 18     Temp 99.6 F (37.6 C)     Temp Source Oral     SpO2 100 %     Weight      Height      Head Circumference      Peak Flow      Pain Score      Pain Loc      Pain Education      Exclude from Growth Chart     Most recent vital signs: Vitals:   06/25/23 1830 06/25/23 1900  BP: (!) 96/51 118/62  Pulse: 92 (!) 59  Resp: 20 17  Temp:    SpO2: 100% 97%    General: Awake, no distress.  CV:  Good peripheral  perfusion.  Resp:  Normal effort.  On chronic oxygen  without significant wheezing Abd:  No distention.  Fairly diffuse tenderness without guarding or peritoneal features. MSK:  No deformity noted.  Palpation of all 4 extremities without signs of trauma. Neuro:  No focal deficits appreciated. Other:     ED Results / Procedures / Treatments   Labs (all labs ordered are listed, but only abnormal results are displayed) Labs Reviewed  COMPREHENSIVE METABOLIC PANEL WITH GFR - Abnormal; Notable for the following components:      Result Value   Glucose, Bld 112 (*)    Creatinine, Ser 1.38 (*)    Alkaline Phosphatase 161 (*)    Total Bilirubin 2.1 (*)    GFR, Estimated 51 (*)    All other components within normal limits  CBC - Abnormal; Notable for the following components:   WBC 12.8 (*)    All other components within normal limits  URINALYSIS, ROUTINE W REFLEX MICROSCOPIC - Abnormal; Notable for the following components:   Color, Urine YELLOW (*)  APPearance CLOUDY (*)    Leukocytes,Ua LARGE (*)    Bacteria, UA RARE (*)    All other components within normal limits  URINE CULTURE  LIPASE, BLOOD    EKG A-fib with rate 72 bpm.  Leftward axis, intervals.  No STEMI.  RADIOLOGY CXR interpreted by me without evidence of acute cardiopulmonary pathology. RUQ ultrasound interpreted by me with a distended gallbladder CT abdomen/pelvis interpreted by me without evidence of acute pathology  Official radiology report(s): US  ABDOMEN LIMITED RUQ (LIVER/GB) Result Date: 06/25/2023 CLINICAL DATA:  86105 Emesis 86105 [id number] Abdominal pain [id number] Dilated gallbladder [id number] EXAM: ULTRASOUND ABDOMEN LIMITED COMPARISON:  None Available. FINDINGS: The liver demonstrates normal parenchymal echogenicity and homogeneous texture without focal hepatic parenchymal lesions or intrahepatic ductal dilatation. Hepatopetal portal vein flow. Mildly distended gallbladder. The gallbladder demonstrates no  stones, wall thickening or pericholecystic fluid. CBD measured 0.3cm. IMPRESSION: Mildly distended gallbladder. Otherwise unremarkable examination of the right upper quadrant. Electronically Signed   By: Sydell Eva M.D.   On: 06/25/2023 19:01   CT ABDOMEN PELVIS W CONTRAST Result Date: 06/25/2023 CLINICAL DATA:  Nausea and vomiting, lower abdominal tenderness, weakness EXAM: CT ABDOMEN AND PELVIS WITH CONTRAST TECHNIQUE: Multidetector CT imaging of the abdomen and pelvis was performed using the standard protocol following bolus administration of intravenous contrast. RADIATION DOSE REDUCTION: This exam was performed according to the departmental dose-optimization program which includes automated exposure control, adjustment of the mA and/or kV according to patient size and/or use of iterative reconstruction technique. CONTRAST:  OMNIPAQUE  IOHEXOL  300 MG/ML  SOLN COMPARISON:  05/18/2023, 03/04/2022 FINDINGS: Lower chest: Stable subpleural scarring and fibrosis at the lung bases. No acute pleural or parenchymal lung disease. Hepatobiliary: No focal liver abnormality is seen. No gallstones, gallbladder wall thickening, or biliary dilatation. Pancreas: Unremarkable. No pancreatic ductal dilatation or surrounding inflammatory changes. Spleen: Normal in size without focal abnormality. Adrenals/Urinary Tract: Stable nonobstructing left renal calculi measuring up to 7 mm. Otherwise the kidneys are unremarkable. The adrenals and bladder are unremarkable. Stomach/Bowel: No bowel obstruction or ileus. Normal appendix right lower quadrant. Diffuse colonic diverticulosis without evidence of acute diverticulitis. No bowel wall thickening or inflammatory change. Vascular/Lymphatic: Aortic atherosclerosis. No enlarged abdominal or pelvic lymph nodes. Reproductive: Stable enlargement of the prostate. Other: No free fluid or free intraperitoneal gas. No abdominal wall hernia. Musculoskeletal: Healing subacute right  superior and inferior pubic rami fractures. No acute bony abnormalities. Reconstructed images demonstrate no additional findings. IMPRESSION: 1. No acute intra-abdominal or intrapelvic process. 2. Stable nonobstructing left renal calculi. 3. Diverticulosis without evidence of acute diverticulitis. 4. Healing subacute right superior and inferior pubic rami fractures, with near anatomic alignment. 5.  Aortic Atherosclerosis (ICD10-I70.0). Electronically Signed   By: Bobbye Burrow M.D.   On: 06/25/2023 17:57   DG Chest 2 View Result Date: 06/25/2023 CLINICAL DATA:  COPD, increased cough. eval infiltrate, ptx EXAM: CHEST - 2 VIEW COMPARISON:  Chest x-ray 05/15/2023 FINDINGS: The heart and mediastinal contours are unchanged. Atherosclerotic plaque. No focal consolidation. Chronic coarsened interstitial markings with no overt pulmonary edema. No pleural effusion. No pneumothorax. No acute osseous abnormality. IMPRESSION: 1. No active cardiopulmonary disease. 2. Aortic Atherosclerosis (ICD10-I70.0) and Emphysema (ICD10-J43.9). Electronically Signed   By: Morgane  Naveau M.D.   On: 06/25/2023 16:37    PROCEDURES and INTERVENTIONS:  .1-3 Lead EKG Interpretation  Performed by: Arline Bennett, MD Authorized by: Arline Bennett, MD     Interpretation: normal     ECG rate:  76  ECG rate assessment: normal     Rhythm: atrial fibrillation     Ectopy: none     Conduction: normal     Medications  cefTRIAXone  (ROCEPHIN ) 1 g in sodium chloride  0.9 % 100 mL IVPB (0 g Intravenous Stopped 06/25/23 1621)  sodium chloride  0.9 % bolus 500 mL (0 mLs Intravenous Stopped 06/25/23 1832)  iohexol  (OMNIPAQUE ) 300 MG/ML solution 100 mL (100 mLs Intravenous Contrast Given 06/25/23 1708)     IMPRESSION / MDM / ASSESSMENT AND PLAN / ED COURSE  I reviewed the triage vital signs and the nursing notes.  Differential diagnosis includes, but is not limited to, COPD exacerbation, dehydration, AKI, sepsis, pancreatitis, UTI,  pneumonia, viral syndrome  {Patient presents with symptoms of an acute illness or injury that is potentially life-threatening.  Patient presents to the ED with generalized weakness after an episode of emesis today superimposed on 2 days of nausea.  Possibly of urinary etiology.  He has a chronic history of dysuria and pyuria but only occasionally has urine culture was positive for infection.  His urine today is certainly concerning and is sent for culture and presumably started on antibiotics for cystitis considering his acutely worsening symptoms and leukocytosis.  Renal function around baseline, alkaline phosphatase and bilirubin is slightly more elevated than previous but normal LFTs.  Imaging, as above, is generally benign.  Mildly distended gallbladder but no signs of cholecystitis or choledocholithiasis.  I considered admission for this patient but he is eager to go home and has family that he stays with as well as next-door to keep an eye on him.  Discharged with antiemetics, antibiotics for urine and discussed close return precautions.  Clinical Course as of 06/25/23 1929  Paulene Boron Jun 25, 2023  1909 Reassessed.  Patient reports feeling better and wants to go home.  Daughter remains at the bedside.  We discussed ambulatory trial.  We discussed imaging results and overall workup.  Due to his acute symptoms I do recommend antibiotics for the urine while his culture is pending and he is agreeable [DS]  1929 Patient got up with a walker and a little bit unsteady with the nurse.  Family with him reports that this is normal and this is how he has been walking since his recent pelvic bone fractures.  Patient still eager to go home. [DS]    Clinical Course User Index [DS] Arline Bennett, MD     FINAL CLINICAL IMPRESSION(S) / ED DIAGNOSES   Final diagnoses:  Generalized weakness  Dysuria  Nausea and vomiting, unspecified vomiting type     Rx / DC Orders   ED Discharge Orders          Ordered     cefUROXime (CEFTIN) 250 MG tablet  2 times daily with meals        06/25/23 1928    ondansetron  (ZOFRAN -ODT) 4 MG disintegrating tablet  Every 8 hours PRN        06/25/23 1928             Note:  This document was prepared using Dragon voice recognition software and may include unintentional dictation errors.   Arline Bennett, MD 06/25/23 [phone]

## 2023-06-25 NOTE — ED Notes (Signed)
 Pt became dizzy when standing. Pt began to lose his balance a few times but family stated that was normal since the fall last month

## 2023-06-25 NOTE — Discharge Instructions (Addendum)
 As we discussed, you are being discharged with a prescription for Ceftin antibiotics to take twice daily for 5 more days to treat a possible urinary infection.   Zofran  antinausea medicine  Return to the ED with any worsening symptoms

## 2023-06-25 NOTE — ED Triage Notes (Signed)
 EMS states patient complaining of generalized weakness since he woke up this AM; denies sick contacts.

## 2023-06-27 ENCOUNTER — Telehealth (HOSPITAL_BASED_OUTPATIENT_CLINIC_OR_DEPARTMENT_OTHER): Payer: Self-pay

## 2023-06-27 LAB — URINE CULTURE: Culture: >=100000 — AB

## 2023-06-27 NOTE — Telephone Encounter (Signed)
 Copied from CRM [id number]. Topic: Clinical - Order For Equipment >> Jun 26, 2023  3:21 PM Hilton Lucky wrote: Reason for CRM: Patient is requesting an order for a portable concentrator to be sent to [facility]. Patient is unable to drive without oxygen  dropping. Fax of [phone] provided to patient. >> Jun 27, 2023  3:53 PM Ilean Mall F wrote: Pt's granddaughter April called back today to request an update on the pt's request for a portable oxygen  concentraotr through [employer]. I'm letting the pt's granddaughter know the CRM was sent yesterday and if there's anything needed from the pt to please call the phone number [phone]. Fax number [phone].

## 2023-06-28 NOTE — Telephone Encounter (Signed)
 In the last OV note it state he only uses O2 at bedtime.  I spoke with April (DPR) and scheduled him an appt for tomorrow to do a walk test to qualify him for POC.She said he got O2 when he was in the hospital.  Nothing further needed.

## 2023-06-29 ENCOUNTER — Encounter: Payer: Self-pay | Admitting: Internal Medicine

## 2023-06-29 ENCOUNTER — Ambulatory Visit (INDEPENDENT_AMBULATORY_CARE_PROVIDER_SITE_OTHER): Admitting: Internal Medicine

## 2023-06-29 ENCOUNTER — Encounter

## 2023-06-29 VITALS — BP 130/60 | HR 65 | Temp 97.7°F | Wt 161.4 lb

## 2023-06-29 DIAGNOSIS — R0689 Other abnormalities of breathing: Secondary | ICD-10-CM

## 2023-06-29 DIAGNOSIS — J9611 Chronic respiratory failure with hypoxia: Secondary | ICD-10-CM | POA: Diagnosis not present

## 2023-06-29 DIAGNOSIS — J449 Chronic obstructive pulmonary disease, unspecified: Secondary | ICD-10-CM

## 2023-06-29 MED ORDER — ARFORMOTEROL TARTRATE 15 MCG/2ML IN NEBU: 15.0000 ug | INHALATION_SOLUTION | Freq: Two times a day (BID) | RESPIRATORY_TRACT | 10 refills | Status: AC

## 2023-06-29 MED ORDER — BUDESONIDE 0.5 MG/2ML IN SUSP: 0.5000 mg | Freq: Two times a day (BID) | RESPIRATORY_TRACT | 0 refills | Status: AC

## 2023-06-29 MED ORDER — ROFLUMILAST 500 MCG PO TABS: 500.0000 ug | ORAL_TABLET | Freq: Every day | ORAL | 3 refills | Status: AC

## 2023-06-29 NOTE — Patient Instructions (Addendum)
 Please continue oxygen  when you are walking  Lets plan to use nebulizers with Pulmicort  twice a day Lets plan to use Brovana  nebulizers twice a day  Start Roflulamist pill for breathing   Avoid Allergens and Irritants Avoid secondhand smoke Avoid SICK contacts Recommend  Masking  when appropriate Recommend Keep up-to-date with vaccinations

## 2023-06-29 NOTE — Progress Notes (Signed)
 [name] Browning, male    DOB: 1940/08/29,  MRN: [id number]   SYNOPSIS 83 year old male, former smoker followed for COPD with chronic bronchitis and emphysema and chronic respiratory failure on supplemental O2.  Past medical history significant for HTN, PAF, CHF, hx of DVT, hx of STEMI, allergic rhinitis, GERD, CKD, BPH, HLD. PREVIOUS ADMISSIONS First admission was from 10/20/2020 to 10/31/2020 when he was admitted with respiratory failure due to COPD and pneumonia, as well as an obstructive left renal stone.  He developed hypotension and worsening respiratory distress requiring pressors and BiPAP.     Blood cultures grew positive for Enterococcus faecalis and staph hemolyticus.  He was treated with antibiotics ending 11/03/2020.  The patient was recently admitted to the hospital from 10/25-10/28 with complaints of dysphagia.     He was seen by gastroenterology who underwent an EGD with no significant findings.  Since that time he has had progressive worsening shortness of breath, eventually presented to the emergency department where he was found to have an oxygen  saturation of 88% on his home 2 L of oxygen .  His labs are otherwise notable for a troponin of 620, BNP of 220, hemoglobin of 8.2 (9.7 on 11/06/2020).  Chest x-ray shows diffuse bilateral infiltrates which could be consistent with edema but some overlying scar.   TESTS/EVENTS: 11/15/2020 echo: EF 60 to 65%.  G1 DD.  RV function severely reduced and enlarged.  LA severely dilated.  RA severely dilated.  Mild MR. 07/03/2022 CTA chest: No PE.  Atherosclerosis/CAD.  Cardiomegaly.  Emphysema.  Nodular biapical scarring, unchanged.  Mild bilateral gynecomastia  06/09/2022: OV Significant SOB/DOE. Chronic productive cough/chest congestion over the last 8 months. Several hospitalizations over the last year. Progressive respiratory disease. Chronic hypoxic respiratory failure; currently on 5 lpm. COPD progressed to end stage with previous infections  and hospitalizations. Started on Breztri  inhaler previously. Was also given 20 mg daily at last OV. Started on daliresp . Recommended pulmonary rehab.        CC Follow-up assessment for COPD Follow-up assessment for chronic hypoxic respiratory failure Follow-up assessment for respiratory insufficiency  HPI Progressive chronic shortness of breath and dyspnea on exertion Patient did not tolerate phosphodiesterase inhibitor nebulized therapy Plan to start Pulmicort  and Brovana  nebulizers Plan to start Daliresp  phosphodiesterase oral inhibitor   Patient with chronic productive cough and chest congestion Several hospitalizations over the last couple years  No exacerbation at this time No evidence of heart failure at this time No evidence or signs of infection at this time No respiratory distress No fevers, chills, nausea, vomiting, diarrhea No evidence of lower extremity edema No evidence hemoptysis   Chronic hypoxic respiratory failure due to COPD Ambulating pulse oximetry in the office today shows hypoxia patient will need oxygen  therapy with exertion  I have recommended that he bring his granddaughter to the next office visit    Past Medical History:  Diagnosis Date   Anginal pain (HCC)    Anxiety    Aortic atherosclerosis (HCC)    Atrial fibrillation (HCC) 01/2019   Bilateral carpal tunnel syndrome    CAD (coronary artery disease)    a.) LHC 2004 --> normal coronaries. b.) normal stress test in 2007 and 2011; c.) Lexiscan 05/20/2014 --> LVEF 55-65%; no significant stress induced ischemia/arrythmia. d.) CT chest 03/25/2019 --> coronaries carcified.   Carotid atherosclerosis, bilateral    Carpal tunnel syndrome, bilateral    CHF (congestive heart failure) (HCC)    Chronic anticoagulation    a.)  ASA + apixaban    COPD (chronic obstructive pulmonary disease) (HCC)    CVA (cerebral vascular accident) (HCC)    Degenerative disc disease, cervical    Diastolic dysfunction     a.) TTE 05/29/2014 --> LVEF 60-65%; G1DD.   DVT (deep venous thrombosis) (HCC)    GERD (gastroesophageal reflux disease)    History of 2019 novel coronavirus disease (COVID-19) 02/08/2019   HLD (hyperlipidemia)    Hypertension    Kidney stones    Osteoarthritis of right shoulder    Pneumonia    Respiratory failure, acute (HCC) 09/20/2020   a.) severe respiratory distress 1 hour after urological surgery. CXR (+) for acute pulmonary edema. Transferred to ICU and placed on NIPPV. Questionable aspiration PNA. (+) A.fib with RVR. Improved with ABX, diuresis, and amiodarone .   Skin cancer of face    a.) RIGHT ear and RIGHT forehead; excised.   Syncope    TIA (transient ischemic attack) 2016   Valvular regurgitation    a.) TTE 05/26/2014 --> LVEF 60-65%; trivial MR, mild TR; no AR or PR. b.) TTE 04/20/2015 --> LVEF 55-60%; trivial MR and PR; no AR or TR.    Outpatient Medications Prior to Visit  Medication Sig Dispense Refill   acetaminophen  (TYLENOL ) 500 MG tablet Take 2 tablets (1,000 mg total) by mouth every 6 (six) hours as needed for mild pain.     amLODipine  (NORVASC ) 5 MG tablet Take 1 tablet (5 mg total) by mouth daily after supper. 30 tablet 2   apixaban  (ELIQUIS ) 5 MG TABS tablet Take 1 tablet (5 mg total) by mouth 2 (two) times daily. 60 tablet 2   aspirin  EC 81 MG tablet Take 81 mg by mouth daily after supper.     atorvastatin  (LIPITOR ) 40 MG tablet Take 40 mg by mouth daily after supper.     Ferrous Gluconate  (IRON ) 240 (27 Fe) MG TABS Take 27 mg by mouth daily after supper.     furosemide  (LASIX ) 20 MG tablet Take 1 tablet (20 mg total) by mouth daily after breakfast. 30 tablet 2   guaiFENesin -dextromethorphan  (ROBITUSSIN DM) 100-10 MG/5ML syrup Take 5 mLs by mouth every 4 (four) hours as needed for cough. 118 mL 0   losartan  (COZAAR ) 100 MG tablet Take 100 mg by mouth daily after supper.     montelukast  (SINGULAIR ) 10 MG tablet Take 10 mg by mouth at bedtime.     pantoprazole   (PROTONIX ) 40 MG tablet Take 40 mg by mouth daily after supper.     phenazopyridine  (PYRIDIUM ) 200 MG tablet Take 1 tablet (200 mg total) by mouth 3 (three) times daily as needed (bladder pain). 10 tablet 0   polyethylene glycol (MIRALAX  / GLYCOLAX ) 17 g packet Take 17 g by mouth daily as needed for mild constipation or moderate constipation. (Patient taking differently: Take 17 g by mouth daily.) 14 each 0                  No facility-administered medications prior to visit.     BP 130/60 (BP Location: Right Arm, Patient Position: Sitting, Cuff Size: Normal)   Pulse 65   Temp 97.7 F (36.5 C) (Oral)   Wt 161 lb 6.4 oz (73.2 kg)   SpO2 98% Comment: on 3L of O2  BMI 20.72 kg/m        Review of Systems: Gen:  Denies  fever, sweats, chills weight loss  HEENT: Denies blurred vision, double vision, ear pain, eye pain, hearing loss, nose bleeds,  sore throat Cardiac:  No dizziness, chest pain or heaviness, chest tightness,edema, No JVD Resp:   No cough, -sputum production, +shortness of breath,-wheezing, -hemoptysis,  Other:  All other systems negative   Physical Examination:   General Appearance: No distress  EYES PERRLA, EOM intact.   NECK Supple, No JVD Pulmonary: normal breath sounds, No wheezing.  CardiovascularNormal S1,S2.  No m/r/g.   Abdomen: Benign, Soft, non-tender. Neurology UE/LE 5/5 strength, no focal deficits Ext pulses intact, cap refill intact ALL OTHER ROS ARE NEGATIVE     Labs ordered/ reviewed:      Chemistry      Component Value Date/Time   NA 139 06/25/2023 1351   NA 142 11/21/2022 1030   NA 144 08/12/2013 0546   K 3.5 06/25/2023 1351   K 4.5 08/12/2013 0546   CL 100 06/25/2023 1351   CL 113 (H) 08/12/2013 0546   CO2 29 06/25/2023 1351   CO2 26 08/12/2013 0546   BUN 21 06/25/2023 1351   BUN 24 11/21/2022 1030   BUN 25 (H) 08/12/2013 0546   CREATININE 1.38 (H) 06/25/2023 1351   CREATININE 1.00 06/08/2016 1044      Component Value  Date/Time   CALCIUM  9.6 06/25/2023 1351   CALCIUM  8.3 (L) 08/12/2013 0546   ALKPHOS 161 (H) 06/25/2023 1351   ALKPHOS 112 08/10/2013 1703   AST 26 06/25/2023 1351   AST 27 08/10/2013 1703   ALT 16 06/25/2023 1351   ALT 22 08/10/2013 1703   BILITOT 2.1 (H) 06/25/2023 1351   BILITOT 0.7 11/21/2022 1030   BILITOT 0.7 08/10/2013 1703            Assessment   83 year old pleasant white male seen today for follow-up assessment for severe end-stage COPD with previous bouts of COVID-19 infection over the last several years with progressive respiratory insufficiency and chronic hypoxic respiratory failure with diastolic dysfunction  Overall prognosis is poor   End-stage COPD Patient progressed to respiratory insufficiency Patient can no longer take traditional inhaler therapy Patient did not tolerate phosphodiesterase nebulizer Plan to start Pulmicort  nebs Plan to start Brovana  nebs DME referral for new nebulizer machine with mask Avoid Allergens and Irritants Avoid secondhand smoke Avoid SICK contacts Recommend  Masking  when appropriate Recommend Keep up-to-date with vaccinations   Chronic Hypoxic resp failure due to COPD -Patient benefits from oxygen  therapy 2L [place]  -recommend using oxygen  as prescribed -patient needs this for survival Ambulating pulse oximetry in the office showed significant hypoxia with ambulation  Severe respiratory insufficiency end-stage COPD Start Daliresp  500 mg daily Oral phosphodiesterase inhibitor No exacerbation at this time No evidence of heart failure at this time No evidence or signs of infection at this time No respiratory distress No fevers, chills, nausea, vomiting, diarrhea No evidence of lower extremity edema No evidence hemoptysis    MEDICATION ADJUSTMENTS/LABS AND TESTS ORDERED: Start Pulmicort  nebs Brovana  nebs DME referral for oxygen  and nebulizer machine with mask Start Daliresp  Recommend granddaughter at next office  visit Avoid Allergens and Irritants Avoid secondhand smoke Avoid SICK contacts Recommend  Masking  when appropriate Recommend Keep up-to-date with vaccinations   CURRENT MEDICATIONS REVIEWED AT LENGTH WITH PATIENT TODAY   Patient  satisfied with Plan of action and management. All questions answered   Follow up  3 months   I spent a total of 54 minutes reviewing chart data, face-to-face evaluation with the patient, counseling and coordination of care as detailed above.     Lady Pier, M.D.  [facility]  Medical Director [facility] Medical Director [facility] Cardio-Pulmonary Department

## 2023-07-01 ENCOUNTER — Other Ambulatory Visit
Admission: RE | Admit: 2023-07-01 | Discharge: 2023-07-01 | Disposition: A | Discharge status: Home / Self Care | Payer: Self-pay | Source: Ambulatory Visit | Attending: Medical Genetics | Admitting: Medical Genetics

## 2023-07-04 ENCOUNTER — Other Ambulatory Visit: Payer: Self-pay

## 2023-07-04 DIAGNOSIS — S67191D Crushing injury of left index finger, subsequent encounter: Secondary | ICD-10-CM | POA: Diagnosis not present

## 2023-07-04 DIAGNOSIS — S32511D Fracture of superior rim of right pubis, subsequent encounter for fracture with routine healing: Secondary | ICD-10-CM | POA: Diagnosis not present

## 2023-07-04 MED ORDER — TORSEMIDE 20 MG PO TABS: 20.0000 mg | ORAL_TABLET | Freq: Every day | ORAL | 3 refills | Status: DC

## 2023-07-10 ENCOUNTER — Ambulatory Visit: Admitting: Cardiovascular Disease

## 2023-07-13 ENCOUNTER — Telehealth: Payer: Self-pay

## 2023-07-13 NOTE — Telephone Encounter (Signed)
 Per fax from [employer]- patient was unable to maintain SAT >/=90% while ambulating on a flat surface. Patient and RT discussed results of and RT recommendations. Patient acknowledged understanding.   Note has been scanned into the patient's chart.  Nothing further needed.

## 2023-07-20 ENCOUNTER — Ambulatory Visit

## 2023-07-20 DIAGNOSIS — I4891 Unspecified atrial fibrillation: Secondary | ICD-10-CM

## 2023-07-20 DIAGNOSIS — R0789 Other chest pain: Secondary | ICD-10-CM

## 2023-07-20 DIAGNOSIS — I5033 Acute on chronic diastolic (congestive) heart failure: Secondary | ICD-10-CM | POA: Diagnosis not present

## 2023-07-20 DIAGNOSIS — I10 Essential (primary) hypertension: Secondary | ICD-10-CM

## 2023-07-20 MED ORDER — TECHNETIUM TC 99M SESTAMIBI GENERIC - CARDIOLITE
10.0000 | Freq: Once | INTRAVENOUS | Status: AC | PRN
  Administered 2023-07-20: 10 via INTRAVENOUS

## 2023-07-20 MED ORDER — TECHNETIUM TC 99M SESTAMIBI GENERIC - CARDIOLITE
30.0000 | Freq: Once | INTRAVENOUS | Status: AC | PRN
  Administered 2023-07-20: 30 via INTRAVENOUS

## 2023-07-24 ENCOUNTER — Ambulatory Visit: Payer: Self-pay | Admitting: Cardiovascular Disease

## 2023-07-24 ENCOUNTER — Ambulatory Visit (INDEPENDENT_AMBULATORY_CARE_PROVIDER_SITE_OTHER): Admitting: Cardiovascular Disease

## 2023-07-24 ENCOUNTER — Encounter: Payer: Self-pay | Admitting: Cardiovascular Disease

## 2023-07-24 VITALS — BP 112/58 | HR 78 | Ht 74.0 in | Wt 159.0 lb

## 2023-07-24 DIAGNOSIS — I10 Essential (primary) hypertension: Secondary | ICD-10-CM | POA: Diagnosis not present

## 2023-07-24 DIAGNOSIS — I5033 Acute on chronic diastolic (congestive) heart failure: Secondary | ICD-10-CM

## 2023-07-24 DIAGNOSIS — R9439 Abnormal result of other cardiovascular function study: Secondary | ICD-10-CM

## 2023-07-24 DIAGNOSIS — I4891 Unspecified atrial fibrillation: Secondary | ICD-10-CM | POA: Diagnosis not present

## 2023-07-24 DIAGNOSIS — R0789 Other chest pain: Secondary | ICD-10-CM

## 2023-07-24 LAB — CBC WITH DIFFERENTIAL/PLATELET
Basophils Absolute: 0 x10E3/uL (ref 0.0–0.2)
Basos: 1 %
EOS (ABSOLUTE): 0.1 x10E3/uL (ref 0.0–0.4)
Eos: 1 %
Hematocrit: 37.5 % (ref 37.5–51.0)
Hemoglobin: 12 g/dL — ABNORMAL LOW (ref 13.0–17.7)
Immature Grans (Abs): 0 x10E3/uL (ref 0.0–0.1)
Immature Granulocytes: 0 %
Lymphocytes Absolute: 1 x10E3/uL (ref 0.7–3.1)
Lymphs: 14 %
MCH: 30.8 pg (ref 26.6–33.0)
MCHC: 32 g/dL (ref 31.5–35.7)
MCV: 96 fL (ref 79–97)
Monocytes Absolute: 0.6 x10E3/uL (ref 0.1–0.9)
Monocytes: 8 %
Neutrophils Absolute: 5.7 x10E3/uL (ref 1.4–7.0)
Neutrophils: 76 %
Platelets: 160 x10E3/uL (ref 150–450)
RBC: 3.89 x10E6/uL — ABNORMAL LOW (ref 4.14–5.80)
RDW: 12.6 % (ref 11.6–15.4)
WBC: 7.4 x10E3/uL (ref 3.4–10.8)

## 2023-07-24 NOTE — Patient Instructions (Signed)
 Please stop taking metoprolol  25 mg once a day.

## 2023-07-24 NOTE — Progress Notes (Signed)
 Cardiology Office Note   Date:  07/24/2023   ID:  [name] Browning, DOB Mar 02, 1940, MRN [id number]  PCP:  [name] Fredy RAMAN, MD  Cardiologist:  [name] Fernand, MD      History of Present Illness: [name] Browning is a 83 y.o. male who presents for  Chief Complaint  Patient presents with   Follow-up    Follow up    Been doing well.      Past Medical History:  Diagnosis Date   Anginal pain (HCC)    Anxiety    Aortic atherosclerosis (HCC)    Atrial fibrillation (HCC) 01/2019   Bilateral carpal tunnel syndrome    CAD (coronary artery disease)    a.) LHC 2004 --> normal coronaries. b.) normal stress test in 2007 and 2011; c.) Lexiscan 05/20/2014 --> LVEF 55-65%; no significant stress induced ischemia/arrythmia. d.) CT chest 03/25/2019 --> coronaries carcified.   Carotid atherosclerosis, bilateral    Carpal tunnel syndrome, bilateral    CHF (congestive heart failure) (HCC)    Chronic anticoagulation    a.) ASA + apixaban    COPD (chronic obstructive pulmonary disease) (HCC)    CVA (cerebral vascular accident) (HCC)    Degenerative disc disease, cervical    Diastolic dysfunction    a.) TTE 05/29/2014 --> LVEF 60-65%; G1DD.   DVT (deep venous thrombosis) (HCC)    GERD (gastroesophageal reflux disease)    History of 2019 novel coronavirus disease (COVID-19) 02/08/2019   HLD (hyperlipidemia)    Hypertension    Kidney stones    Osteoarthritis of right shoulder    Pneumonia    Respiratory failure, acute (HCC) 09/20/2020   a.) severe respiratory distress 1 hour after urological surgery. CXR (+) for acute pulmonary edema. Transferred to ICU and placed on NIPPV. Questionable aspiration PNA. (+) A.fib with RVR. Improved with ABX, diuresis, and amiodarone .   Skin cancer of face    a.) RIGHT ear and RIGHT forehead; excised.   Syncope    TIA (transient ischemic attack) 2016   Valvular regurgitation    a.) TTE 05/26/2014 --> LVEF 60-65%; trivial MR, mild TR; no AR or PR. b.) TTE  04/20/2015 --> LVEF 55-60%; trivial MR and PR; no AR or TR.     Past Surgical History:  Procedure Laterality Date   CARDIAC CATHETERIZATION  2004   CARPAL TUNNEL RELEASE Right 06/11/2013   CENTRAL LINE INSERTION  11/14/2020   Procedure: CENTRAL LINE INSERTION;  Surgeon: [name] Alm ORN, MD;  Location: [facility] INVASIVE CV LAB;  Service: Cardiovascular;;   CORONARY/GRAFT ACUTE MI REVASCULARIZATION N/A 11/14/2020   Procedure: Coronary/Graft Acute MI Revascularization;  Surgeon: [name] Alm ORN, MD;  Location: [facility] INVASIVE CV LAB;  Service: Cardiovascular;  Laterality: N/A;   CYSTOSCOPY W/ RETROGRADES  10/16/2020   Procedure: CYSTOSCOPY WITH RETROGRADE PYELOGRAM;  Surgeon: [name] Redell BROCKS, MD;  Location: [facility] ORS;  Service: Urology;;   CYSTOSCOPY W/ URETERAL STENT PLACEMENT Left 09/20/2020   Procedure: CYSTOSCOPY WITH RETROGRADE PYELOGRAM/URETERAL STENT PLACEMENT;  Surgeon: [name] Donnice SAUNDERS, MD;  Location: [facility] ORS;  Service: Urology;  Laterality: Left;   CYSTOSCOPY/URETEROSCOPY/HOLMIUM LASER/STENT PLACEMENT Left 10/16/2020   Procedure: CYSTOSCOPY/URETEROSCOPY/HOLMIUM LASER/STENT PLACEMENT;  Surgeon: [name] Redell BROCKS, MD;  Location: [facility] ORS;  Service: Urology;  Laterality: Left;   ESOPHAGOGASTRODUODENOSCOPY N/A 11/06/2020   Procedure: ESOPHAGOGASTRODUODENOSCOPY (EGD);  Surgeon: [name] Carmine, MD;  Location: [facility] ENDOSCOPY;  Service: Endoscopy;  Laterality: N/A;   LEFT HEART CATH AND CORONARY ANGIOGRAPHY N/A 11/14/2020   Procedure: LEFT HEART CATH AND  CORONARY ANGIOGRAPHY;  Surgeon: [name] Alm ORN, MD;  Location: [facility] INVASIVE CV LAB;  Service: Cardiovascular;  Laterality: N/A;   LEFT HEART CATH AND CORONARY ANGIOGRAPHY Left 01/28/2022   Procedure: LEFT HEART CATH AND CORONARY ANGIOGRAPHY;  Surgeon: [name] [name] LABOR, MD;  Location: [facility] INVASIVE CV LAB;  Service: Cardiovascular;  Laterality: Left;   SHOULDER ARTHROSCOPY WITH OPEN ROTATOR CUFF REPAIR Right 08/24/2017   Procedure: SHOULDER ARTHROSCOPY WITH  OPEN ROTATOR CUFF REPAIR;  Surgeon: [name] Norleen PARAS, MD;  Location: [facility] ORS;  Service: Orthopedics;  Laterality: Right;   SKIN CANCER EXCISION  12/01/2016   right ear    SKIN CANCER EXCISION     remove from the right side of the face    THROMBECTOMY Right 2004   leg   THYROIDECTOMY  1950   Not sure if total or partial thyroidectomy.     Current Outpatient Medications  Medication Sig Dispense Refill   acetaminophen  (TYLENOL ) 500 MG tablet Take 2 tablets (1,000 mg total) by mouth 3 (three) times daily. 30 tablet 0   arformoterol  (BROVANA ) 15 MCG/2ML NEBU Take 2 mLs (15 mcg total) by nebulization 2 (two) times daily. 120 mL 10   aspirin  EC 81 MG tablet Take 81 mg by mouth daily after supper.     atorvastatin  (LIPITOR ) 80 MG tablet Take 1 tablet (80 mg total) by mouth daily. 90 tablet 3   budesonide  (PULMICORT ) 0.5 MG/2ML nebulizer solution Take 2 mLs (0.5 mg total) by nebulization 2 (two) times daily. 1440 mL 0   clopidogrel  (PLAVIX ) 75 MG tablet TAKE 1 TABLET BY MOUTH EVERY DAY 90 tablet 3   DULoxetine  (CYMBALTA ) 30 MG capsule Take 1 capsule (30 mg total) by mouth daily. 30 capsule 0   Ensifentrine  (OHTUVAYRE ) 3 MG/2.5ML SUSP Inhale 2.5 mLs into the lungs 2 (two) times daily.     ferrous gluconate  (FERGON) 240 (27 FE) MG tablet Take 1 tablet (240 mg total) by mouth daily. 30 tablet 0   ibuprofen  (ADVIL ) 400 MG tablet Take 1 tablet (400 mg total) by mouth every 6 (six) hours as needed for headache, mild pain (pain score 1-3), moderate pain (pain score 4-6), cramping or fever. 30 tablet 0   isosorbide  mononitrate (IMDUR ) 30 MG 24 hr tablet Take 1 tablet (30 mg total) by mouth daily. 30 tablet 1   KLOR-CON  M20 20 MEQ tablet TAKE 1 TABLET BY MOUTH EVERY DAY 90 tablet 3   metoprolol  succinate (TOPROL  XL) 25 MG 24 hr tablet Take 1 tablet (25 mg total) by mouth daily. 30 tablet 11   Nebulizers (VIOS LC PLUS) MISC      ondansetron  (ZOFRAN -ODT) 4 MG disintegrating tablet Take 1 tablet (4 mg total) by  mouth every 8 (eight) hours as needed. 20 tablet 0   ranolazine  (RANEXA ) 1000 MG SR tablet TAKE 1 TABLET BY MOUTH TWICE A DAY 180 tablet 1   roflumilast  (DALIRESP ) 500 MCG TABS tablet Take 1 tablet (500 mcg total) by mouth daily. 30 tablet 3   torsemide  (DEMADEX ) 20 MG tablet Take 1 tablet (20 mg total) by mouth daily. 30 tablet 3   No current facility-administered medications for this visit.    Allergies:   Hydromorphone     Social History:   reports that he quit smoking about 20 years ago. His smoking use included cigarettes. He started smoking about 66 years ago. He has a 46.1 pack-year smoking history. He has been exposed to tobacco smoke. He quit smokeless tobacco use about 20 years  ago.  His smokeless tobacco use included snuff. He reports that he does not currently use alcohol after a past usage of about 7.0 standard drinks of alcohol per week. He reports that he does not use drugs.   Family History:  family history includes CAD in his father; Heart attack in his father; Heart disease in his mother; Hypertension in his mother.    ROS:     ROS    All other systems are reviewed and negative.    PHYSICAL EXAM: VS:  BP (!) 112/58   Pulse 78   Ht 6' 2 (1.88 m)   Wt 159 lb (72.1 kg)   SpO2 95%   BMI 20.41 kg/m  , BMI Body mass index is 20.41 kg/m. Last weight:  Wt Readings from Last 3 Encounters:  07/24/23 159 lb (72.1 kg)  06/29/23 161 lb 6.4 oz (73.2 kg)  06/13/23 160 lb 3.2 oz (72.7 kg)     Physical Exam    EKG:   Recent Labs: 06/25/2023: ALT 16; BUN 21; Creatinine, Ser 1.38; Hemoglobin 13.1; Platelets 210; Potassium 3.5; Sodium 139    Lipid Panel    Component Value Date/Time   CHOL 283 (H) 11/21/2022 1030   TRIG 98 11/21/2022 1030   HDL 45 11/21/2022 1030   CHOLHDL 3.7 11/12/2020 0435   VLDL 9 11/12/2020 0435   LDLCALC 221 (H) 11/21/2022 1030   LDLCALC 105 (H) 12/07/2016 9041      Other studies Reviewed: Additional studies/ records that were reviewed  today include:  Review of the above records demonstrates:       No data to display            ASSESSMENT AND PLAN:    ICD-10-CM   1. Abnormal nuclear stress test  R94.39    Anteroapical wall reversible defect with possible ischemia in the LAD territory.  Atrial fibrillation with slow ventricular response rate.  Advise cardiac cath.    2. New onset atrial fibrillation (HCC)  I48.91 Comprehensive metabolic panel    CBC with Diff    Procedural/ Surgical Case Request: LEFT HEART CATH AND CORONARY ANGIOGRAPHY with possible coronary intervention   Afib with slow VR, advise weaning off metoprolol .    3. Essential hypertension, benign  I10 Comprehensive metabolic panel    CBC with Diff    Procedural/ Surgical Case Request: LEFT HEART CATH AND CORONARY ANGIOGRAPHY with possible coronary intervention    4. Acute on chronic diastolic CHF (congestive heart failure) (HCC)  I50.33 Comprehensive metabolic panel    CBC with Diff    Procedural/ Surgical Case Request: LEFT HEART CATH AND CORONARY ANGIOGRAPHY with possible coronary intervention    5. Other chest pain  R07.89 Comprehensive metabolic panel    CBC with Diff    Procedural/ Surgical Case Request: LEFT HEART CATH AND CORONARY ANGIOGRAPHY with possible coronary intervention   Has chest pain and stress test suggest ischaemia in LAD territory.  Patient continues to have chest pain 2-3 times a day.  With history of CABG, advise cath       Problem List Items Addressed This Visit       Cardiovascular and Mediastinum   Essential hypertension, benign   Relevant Orders   Comprehensive metabolic panel   CBC with Diff   Procedural/ Surgical Case Request: LEFT HEART CATH AND CORONARY ANGIOGRAPHY with possible coronary intervention   New onset atrial fibrillation (HCC)   Relevant Orders   Comprehensive metabolic panel   CBC with Diff  Procedural/ Surgical Case Request: LEFT HEART CATH AND CORONARY ANGIOGRAPHY with possible coronary  intervention   Acute on chronic diastolic CHF (congestive heart failure) (HCC)   Relevant Orders   Comprehensive metabolic panel   CBC with Diff   Procedural/ Surgical Case Request: LEFT HEART CATH AND CORONARY ANGIOGRAPHY with possible coronary intervention     Other   Chest pain   Relevant Orders   Comprehensive metabolic panel   CBC with Diff   Procedural/ Surgical Case Request: LEFT HEART CATH AND CORONARY ANGIOGRAPHY with possible coronary intervention   Other Visit Diagnoses       Abnormal nuclear stress test    -  Primary   Anteroapical wall reversible defect with possible ischemia in the LAD territory.  Atrial fibrillation with slow ventricular response rate.  Advise cardiac cath.          Disposition:   Return in about 1 week (around 07/31/2023) for , Please set up cardiac catheterization ideally on Monday or Tuesday or Friday.  And a follow-up.    Total time spent: 30 minutes  Signed,  [name] Bathe, MD  07/24/2023 1:30 PM    [facility]

## 2023-07-25 LAB — COMPREHENSIVE METABOLIC PANEL WITH GFR
ALT: 14 IU/L (ref 0–44)
AST: 23 IU/L (ref 0–40)
Albumin: 4 g/dL (ref 3.7–4.7)
Alkaline Phosphatase: 240 IU/L — ABNORMAL HIGH (ref 44–121)
BUN/Creatinine Ratio: 12 (ref 10–24)
BUN: 16 mg/dL (ref 8–27)
Bilirubin Total: 0.8 mg/dL (ref 0.0–1.2)
CO2: 24 mmol/L (ref 20–29)
Calcium: 9.3 mg/dL (ref 8.6–10.2)
Chloride: 101 mmol/L (ref 96–106)
Creatinine, Ser: 1.31 mg/dL — ABNORMAL HIGH (ref 0.76–1.27)
Globulin, Total: 3.2 g/dL (ref 1.5–4.5)
Glucose: 124 mg/dL — ABNORMAL HIGH (ref 70–99)
Potassium: 4.3 mmol/L (ref 3.5–5.2)
Sodium: 142 mmol/L (ref 134–144)
Total Protein: 7.2 g/dL (ref 6.0–8.5)
eGFR: 54 mL/min/1.73 — ABNORMAL LOW (ref >59)

## 2023-07-28 ENCOUNTER — Telehealth: Payer: Self-pay | Admitting: Family

## 2023-07-28 NOTE — Telephone Encounter (Signed)
 Called to confirm/remind patient of their appointment at [facility] on 07/31/23.   Appointment:   [x] Confirmed  [] Left mess   [] No answer/No voice mail  [] VM Full/unable to leave message  [] Phone not in service  Patient reminded to bring all medications and/or complete list.  Confirmed patient has transportation. Gave directions, instructed to utilize valet parking.

## 2023-07-29 IMAGING — CR DG CHEST 2V
1 series · 2 of 2 positions shown · non-contrast
Comparison: September 27, 2020.

CLINICAL DATA: Shortness of breath.

EXAM:
CHEST - 2 VIEW

[Series 1: dg chest 2 view · 0.14mm/px · 2 of 2 slices shown]
[im 1/2]
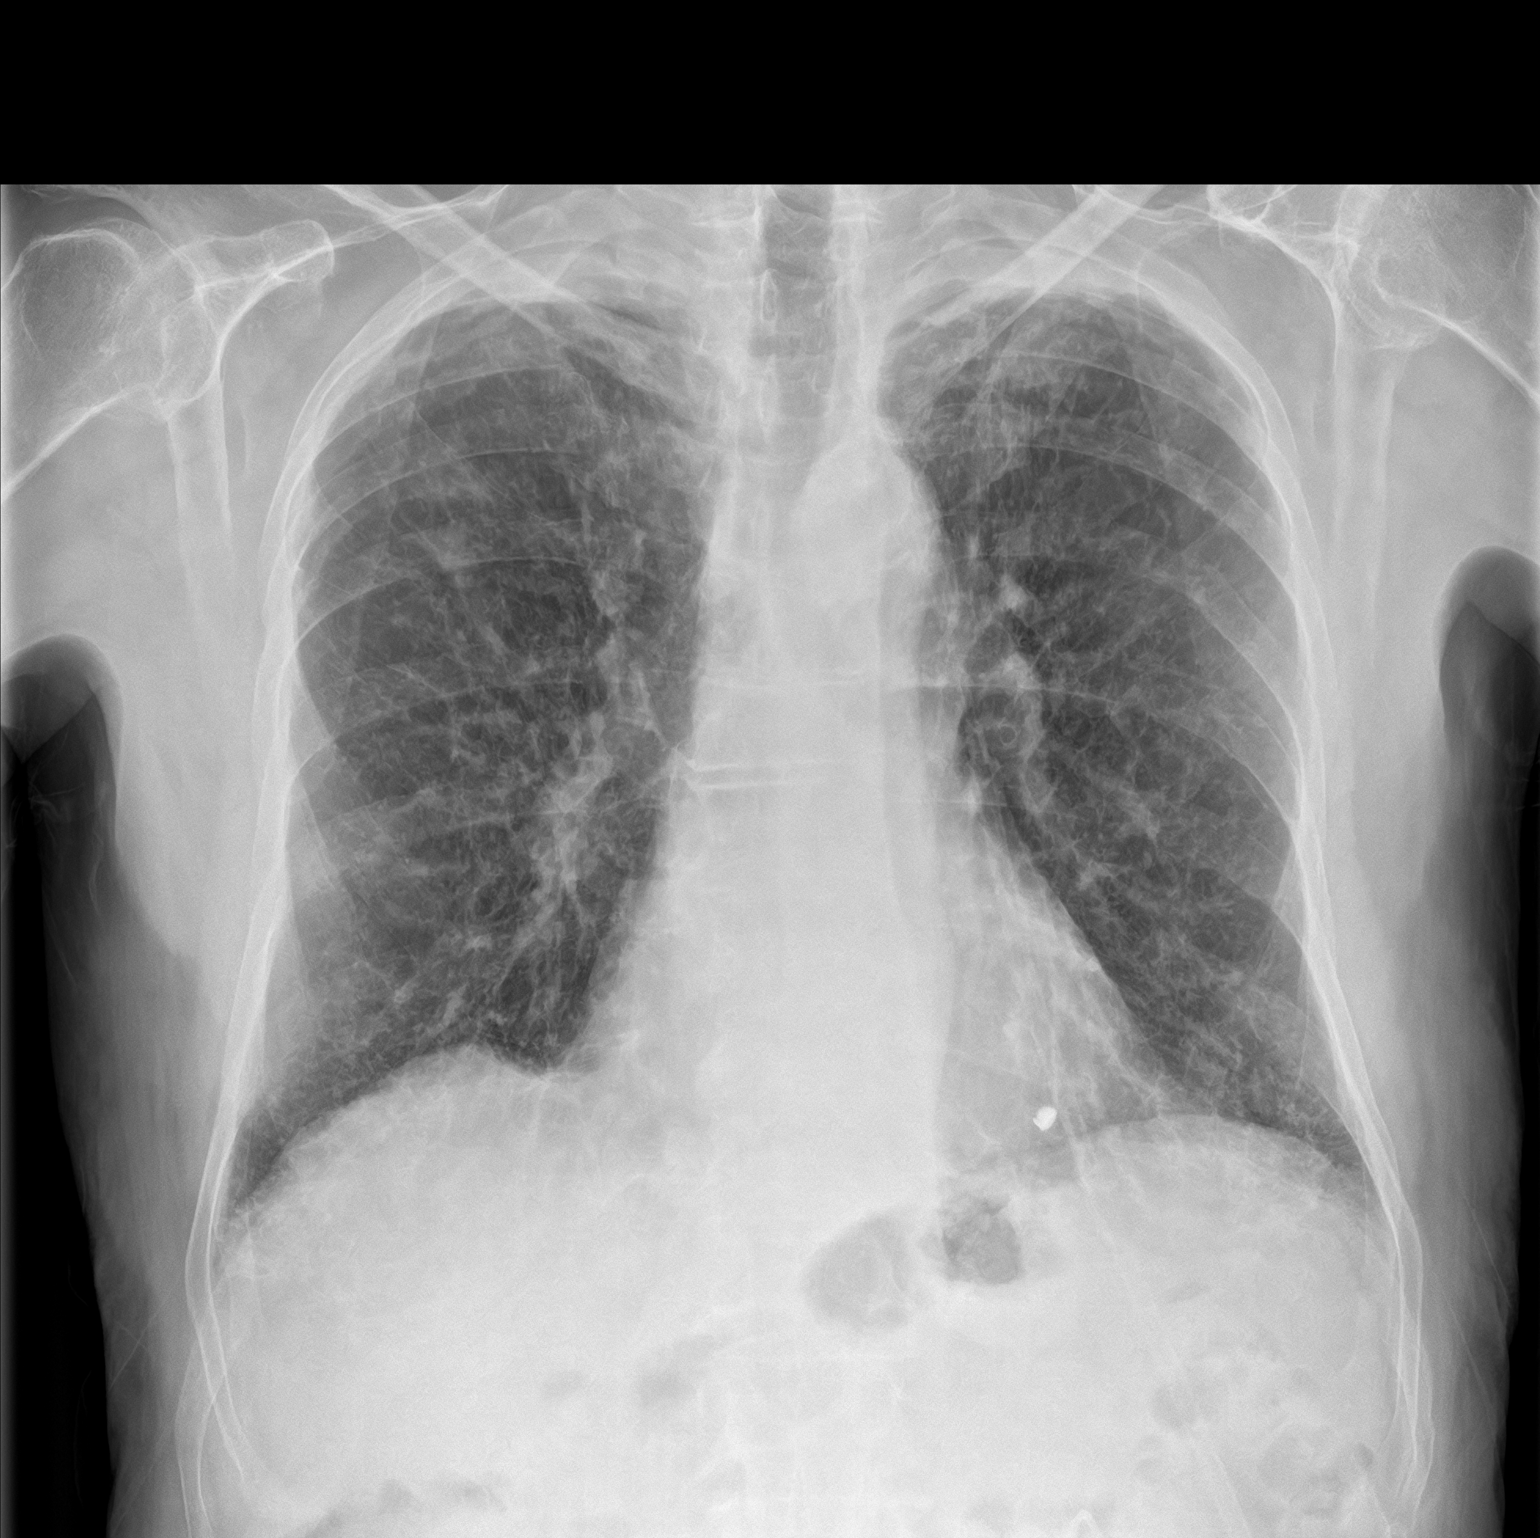
[im 2/2]
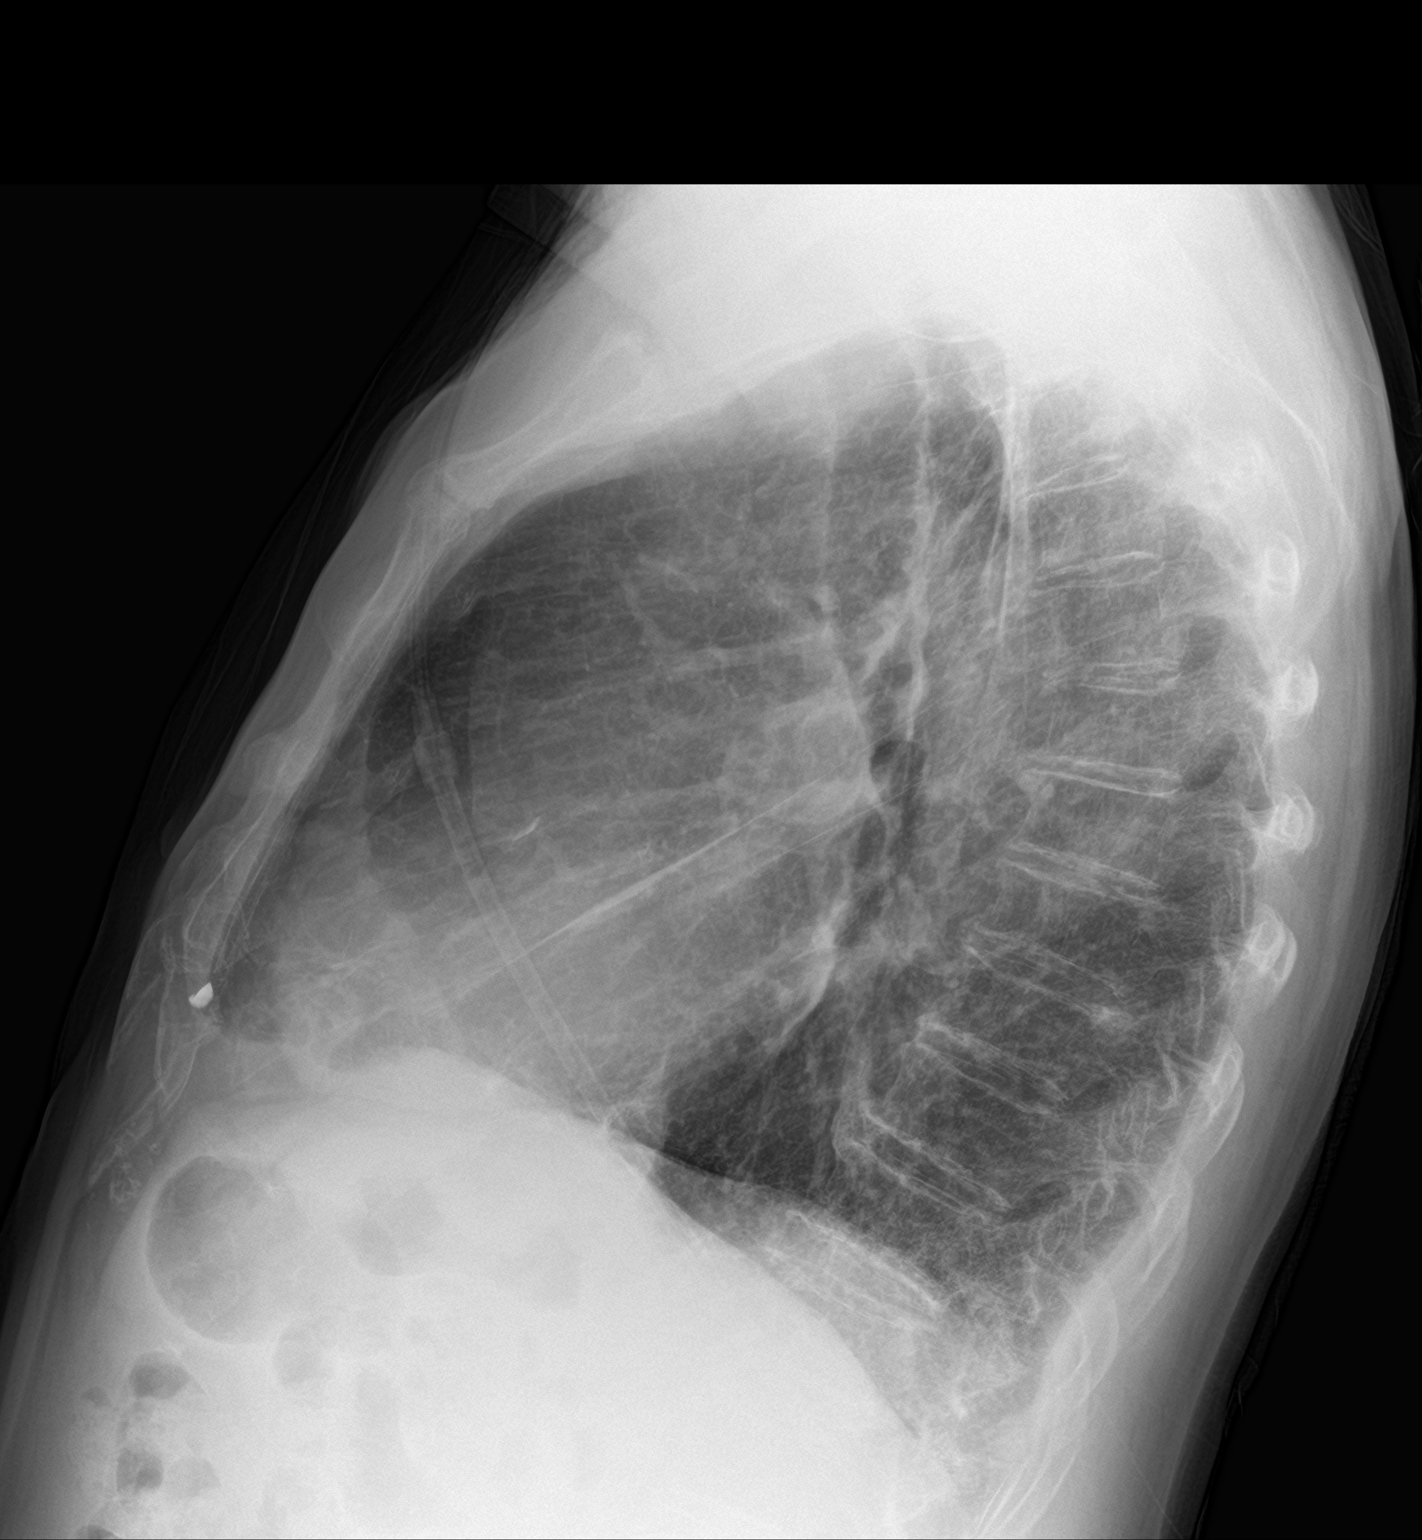

[2 of 2 positions shown; findings below may reference images not displayed]

FINDINGS: The heart size and mediastinal contours are within normal limits.
Stable reticular densities are noted throughout both lungs
consistent with scarring or fibrosis, but acute superimposed
inflammation can not be excluded. The visualized skeletal structures
are unremarkable.
IMPRESSION: Stable reticular densities are noted throughout both lungs
consistent with scarring or fibrosis, but acute superimposed
inflammation can not be excluded.

## 2023-07-30 NOTE — Progress Notes (Unsigned)
 Advanced Heart Failure Clinic Note   Referring Physician: PCP: Fernand Fredy RAMAN, MD Cardiologist: None   Chief Complaint:    HPI:  [name] is a 83 y/o male with a history of carotid disease, CAD s/p 3 strents, hyperlipidemia, HTN, CKD, stroke, anxiety, atrial fibrillation, COPD, DVT, GERD, frequent UTI's, previous tobacco use and chronic heart failure.   Echo 11/15/20:  EF of 60-65% along with severe LAE and mild MR.  Echo 03/17/2021: EF of 60-65%.   LHC done 11/14/20 showed: Ost RCA lesion is 95% stenosed.   Prox RCA to Dist RCA lesion is 100% stenosed. -Very minimal flow.  After initial angiography, it disappeared to progressed to the ostium.   Left-to-right collaterals via septal perforators fill the PDA system.   Mid LM to Dist LM lesion is 20% stenosed.   Ost LAD to Prox LAD lesion is 25% stenosed.  Relatively small caliber vessel that wraps the apex.   Diminutive LCx with moderate sized OM1, mild disease.   The left ventricular systolic function is normal. No obvious regional wall motion normalities.   The left ventricular ejection fraction is 55-65% by visual estimate.   LV end diastolic pressure is moderately elevated.  18 mmHg   There is no aortic valve stenosis.  LHC 01/28/22:   Ost RCA to Prox RCA lesion is 30% stenosed.   Ost LM to Prox LAD lesion is 30% stenosed.   Ost Cx lesion is 40% stenosed.   LV end diastolic pressure is moderately elevated.  Stents in osteal RCA and left main patent, with mild osteal LCX disease. LV gram deffered due to CRI. Treatv medically  Was in the ED 03/14/22 due to exertional chest pain, relieved by NTG spray. Was in the ED 05/06/22 due to burning urgency frequency and had some blood in his urine. Denies flank or abdominal pain denies fevers or chills. Augmentin  provided. Was in the ED 07/03/22 due to chest pain and SOB which became worse with hot/ humid weather. Evaluated and released.     He presents today for a follow-up visit with a chief  complaint of moderate fatigue with minimal exertion. Chronic in nature. Has associated SOB, occasional chest pain and continued urinary symptoms along with this. Denies cough, palpitations, pedal edema, abdominal distention, dizziness        Past Medical History:  Diagnosis Date   Anginal pain (HCC)    Anxiety    Aortic atherosclerosis (HCC)    Atrial fibrillation (HCC) 01/2019   Bilateral carpal tunnel syndrome    CAD (coronary artery disease)    a.) LHC 2004 --> normal coronaries. b.) normal stress test in 2007 and 2011; c.) Lexiscan 05/20/2014 --> LVEF 55-65%; no significant stress induced ischemia/arrythmia. d.) CT chest 03/25/2019 --> coronaries carcified.   Carotid atherosclerosis, bilateral    Carpal tunnel syndrome, bilateral    CHF (congestive heart failure) (HCC)    Chronic anticoagulation    a.) ASA + apixaban    COPD (chronic obstructive pulmonary disease) (HCC)    CVA (cerebral vascular accident) (HCC)    Degenerative disc disease, cervical    Diastolic dysfunction    a.) TTE 05/29/2014 --> LVEF 60-65%; G1DD.   DVT (deep venous thrombosis) (HCC)    GERD (gastroesophageal reflux disease)    History of 2019 novel coronavirus disease (COVID-19) 02/08/2019   HLD (hyperlipidemia)    Hypertension    Kidney stones    Osteoarthritis of right shoulder    Pneumonia    Respiratory failure, acute (  HCC) 09/20/2020   a.) severe respiratory distress 1 hour after urological surgery. CXR (+) for acute pulmonary edema. Transferred to ICU and placed on NIPPV. Questionable aspiration PNA. (+) A.fib with RVR. Improved with ABX, diuresis, and amiodarone .   Skin cancer of face    a.) RIGHT ear and RIGHT forehead; excised.   Syncope    TIA (transient ischemic attack) 2016   Valvular regurgitation    a.) TTE 05/26/2014 --> LVEF 60-65%; trivial MR, mild TR; no AR or PR. b.) TTE 04/20/2015 --> LVEF 55-60%; trivial MR and PR; no AR or TR.    Current Outpatient Medications  Medication Sig  Dispense Refill   acetaminophen  (TYLENOL ) 500 MG tablet Take 2 tablets (1,000 mg total) by mouth 3 (three) times daily. 30 tablet 0   arformoterol  (BROVANA ) 15 MCG/2ML NEBU Take 2 mLs (15 mcg total) by nebulization 2 (two) times daily. 120 mL 10   aspirin  EC 81 MG tablet Take 81 mg by mouth daily after supper.     atorvastatin  (LIPITOR ) 80 MG tablet Take 1 tablet (80 mg total) by mouth daily. 90 tablet 3   budesonide  (PULMICORT ) 0.5 MG/2ML nebulizer solution Take 2 mLs (0.5 mg total) by nebulization 2 (two) times daily. 1440 mL 0   clopidogrel  (PLAVIX ) 75 MG tablet TAKE 1 TABLET BY MOUTH EVERY DAY 90 tablet 3   DULoxetine  (CYMBALTA ) 30 MG capsule Take 1 capsule (30 mg total) by mouth daily. 30 capsule 0   Ensifentrine  (OHTUVAYRE ) 3 MG/2.5ML SUSP Inhale 2.5 mLs into the lungs 2 (two) times daily.     ferrous gluconate  (FERGON) 240 (27 FE) MG tablet Take 1 tablet (240 mg total) by mouth daily. 30 tablet 0   ibuprofen  (ADVIL ) 400 MG tablet Take 1 tablet (400 mg total) by mouth every 6 (six) hours as needed for headache, mild pain (pain score 1-3), moderate pain (pain score 4-6), cramping or fever. 30 tablet 0   isosorbide  mononitrate (IMDUR ) 30 MG 24 hr tablet Take 1 tablet (30 mg total) by mouth daily. 30 tablet 1   KLOR-CON  M20 20 MEQ tablet TAKE 1 TABLET BY MOUTH EVERY DAY 90 tablet 3   metoprolol  succinate (TOPROL  XL) 25 MG 24 hr tablet Take 1 tablet (25 mg total) by mouth daily. 30 tablet 11   Nebulizers (VIOS LC PLUS) MISC      ondansetron  (ZOFRAN -ODT) 4 MG disintegrating tablet Take 1 tablet (4 mg total) by mouth every 8 (eight) hours as needed. 20 tablet 0   ranolazine  (RANEXA ) 1000 MG SR tablet TAKE 1 TABLET BY MOUTH TWICE A DAY 180 tablet 1   roflumilast  (DALIRESP ) 500 MCG TABS tablet Take 1 tablet (500 mcg total) by mouth daily. 30 tablet 3   torsemide  (DEMADEX ) 20 MG tablet Take 1 tablet (20 mg total) by mouth daily. 30 tablet 3   No current facility-administered medications for this  visit.    Allergies  Allergen Reactions   Hydromorphone      Hallucination Pt reports he doesn't know about this      Social History   Socioeconomic History   Marital status: Legally Separated    Spouse name: Not on file   Number of children: 1   Years of education: Not on file   Highest education level: Not on file  Occupational History   Not on file  Tobacco Use   Smoking status: Former    Current packs/day: 0.00    Average packs/day: 1 pack/day for 46.1 years (46.1 ttl  pk-yrs)    Types: Cigarettes    Start date: 01/10/1957    Quit date: 02/11/2003    Years since quitting: 20.4    Passive exposure: Past   Smokeless tobacco: Former    Types: Snuff    Quit date: 02/11/2003  Vaping Use   Vaping status: Never Used  Substance and Sexual Activity   Alcohol use: Not Currently    Alcohol/week: 7.0 standard drinks of alcohol    Types: 7 Cans of beer per week   Drug use: No   Sexual activity: Yes    Partners: Female  Other Topics Concern   Not on file  Social History Narrative   Live with grandaughter, April. 1 son that lives in [place].   Social Drivers of Corporate investment banker Strain: Low Risk  (07/04/2023)   Received from [facility]   Overall Financial Resource Strain (CARDIA)    Difficulty of Paying Living Expenses: Not hard at all  Recent Concern: Financial Resource Strain - Medium Risk (04/17/2023)   Received from [facility]   Overall Financial Resource Strain (CARDIA)    Difficulty of Paying Living Expenses: Somewhat hard  Food Insecurity: No Food Insecurity (07/04/2023)   Received from [facility]   Hunger Vital Sign    Within the past 12 months, you worried that your food would run out before you got the money to buy more.: Never true    Within the past 12 months, the food you bought just didn't last and you didn't have money to get more.: Never true  Recent Concern: Food Insecurity - Food Insecurity  Present (04/17/2023)   Received from [facility]   Hunger Vital Sign    Within the past 12 months, you worried that your food would run out before you got the money to buy more.: Sometimes true    Within the past 12 months, the food you bought just didn't last and you didn't have money to get more.: Sometimes true  Transportation Needs: No Transportation Needs (07/04/2023)   Received from [facility] - Transportation    In the past 12 months, has lack of transportation kept you from medical appointments or from getting medications?: No    Lack of Transportation (Non-Medical): No  Physical Activity: Not on file  Stress: Not on file  Social Connections: Moderately Isolated (05/15/2023)   Social Connection and Isolation Panel    Frequency of Communication with Friends and Family: More than three times a week    Frequency of Social Gatherings with Friends and Family: More than three times a week    Attends Religious Services: 1 to 4 times per year    Active Member of [employer] or Organizations: No    Attends Banker Meetings: Never    Marital Status: Separated  Intimate Partner Violence: Not At Risk (05/15/2023)   Humiliation, Afraid, Rape, and Kick questionnaire    Fear of Current or Ex-Partner: No    Emotionally Abused: No    Physically Abused: No    Sexually Abused: No      Family History  Problem Relation Age of Onset   Hypertension Mother    Heart disease Mother    CAD Father    Heart attack Father        PHYSICAL EXAM: General:  Well appearing. No respiratory difficulty HEENT: normal Neck: supple. no JVD. Carotids 2+ bilat; no bruits. No lymphadenopathy or  thyromegaly appreciated. Cor: PMI nondisplaced. Regular rate & rhythm. No rubs, gallops or murmurs. Lungs: clear Abdomen: soft, nontender, nondistended. No hepatosplenomegaly. No bruits or masses. Good bowel sounds. Extremities: no cyanosis, clubbing, rash,  edema Neuro: alert & oriented x 3, cranial nerves grossly intact. moves all 4 extremities w/o difficulty. Affect pleasant.  ECG:   ASSESSMENT & PLAN:  1: Chronic heart failure with preserved ejection fraction- - NYHA class III - euvolemic today - not weighing daily; encouraged to resume so that he can call for an overnight weight gain of > 2 pounds or a weekly weight gain of > 5 pounds - weight down 22 pounds from last visit here 1 year ago - not adding salt to his food and trying to read food labels for sodium content - does not decreased appetite and feeling full easily - continue losartan  50mg  daily - continue metoprolol  succinate 50mg  daily - continue torsemide  60mg  daily - current BP will not allow for entresto - having issues with recurrent UTI's so would not be a candidate for SGLT2 - BNP 12/28/20 was 511.4   2: HTN- - BP 116/67 - sees PCP ([facility]) 08/24 - BMP 07/03/22 reviewed and showed sodium 143, potassium 4.1, creatinine 1.19 and GFR >60  3: COPD- - saw pulmonology ([facility]) 05/23 - no longer wearing oxygen  - continue daliresp  500mcg daily  4: Atrial fibrillation- - saw cardiology Orvil) 07/24  5: Recurrent UTI's- - saw urology Philipp) 06/24 - on maintenance antibiotics - CT 02/2022: 1 mm right upper pole stone, 10 mm left lower pole stone w/ 2 mm and 3 mm left renal stones   Return in 1 year, sooner if needed.    Ellouise DELENA Class, FNP 07/30/23

## 2023-07-31 ENCOUNTER — Ambulatory Visit: Payer: Medicare HMO | Attending: Family | Admitting: Family

## 2023-07-31 ENCOUNTER — Encounter: Payer: Self-pay | Admitting: Family

## 2023-07-31 VITALS — BP 122/74 | HR 110 | Wt 157.1 lb

## 2023-07-31 DIAGNOSIS — Z7902 Long term (current) use of antithrombotics/antiplatelets: Secondary | ICD-10-CM | POA: Insufficient documentation

## 2023-07-31 DIAGNOSIS — I10 Essential (primary) hypertension: Secondary | ICD-10-CM

## 2023-07-31 DIAGNOSIS — Z955 Presence of coronary angioplasty implant and graft: Secondary | ICD-10-CM | POA: Insufficient documentation

## 2023-07-31 DIAGNOSIS — F419 Anxiety disorder, unspecified: Secondary | ICD-10-CM | POA: Diagnosis not present

## 2023-07-31 DIAGNOSIS — I2510 Atherosclerotic heart disease of native coronary artery without angina pectoris: Secondary | ICD-10-CM | POA: Insufficient documentation

## 2023-07-31 DIAGNOSIS — Z87891 Personal history of nicotine dependence: Secondary | ICD-10-CM | POA: Insufficient documentation

## 2023-07-31 DIAGNOSIS — Z8673 Personal history of transient ischemic attack (TIA), and cerebral infarction without residual deficits: Secondary | ICD-10-CM | POA: Diagnosis not present

## 2023-07-31 DIAGNOSIS — I4811 Longstanding persistent atrial fibrillation: Secondary | ICD-10-CM

## 2023-07-31 DIAGNOSIS — Z87442 Personal history of urinary calculi: Secondary | ICD-10-CM | POA: Diagnosis not present

## 2023-07-31 DIAGNOSIS — I130 Hypertensive heart and chronic kidney disease with heart failure and stage 1 through stage 4 chronic kidney disease, or unspecified chronic kidney disease: Secondary | ICD-10-CM | POA: Diagnosis not present

## 2023-07-31 DIAGNOSIS — N189 Chronic kidney disease, unspecified: Secondary | ICD-10-CM | POA: Insufficient documentation

## 2023-07-31 DIAGNOSIS — K219 Gastro-esophageal reflux disease without esophagitis: Secondary | ICD-10-CM | POA: Diagnosis not present

## 2023-07-31 DIAGNOSIS — J449 Chronic obstructive pulmonary disease, unspecified: Secondary | ICD-10-CM | POA: Diagnosis not present

## 2023-07-31 DIAGNOSIS — Z86718 Personal history of other venous thrombosis and embolism: Secondary | ICD-10-CM | POA: Insufficient documentation

## 2023-07-31 DIAGNOSIS — Z79899 Other long term (current) drug therapy: Secondary | ICD-10-CM | POA: Diagnosis not present

## 2023-07-31 DIAGNOSIS — N390 Urinary tract infection, site not specified: Secondary | ICD-10-CM | POA: Diagnosis not present

## 2023-07-31 DIAGNOSIS — E785 Hyperlipidemia, unspecified: Secondary | ICD-10-CM | POA: Diagnosis not present

## 2023-07-31 DIAGNOSIS — I4891 Unspecified atrial fibrillation: Secondary | ICD-10-CM | POA: Insufficient documentation

## 2023-07-31 DIAGNOSIS — Z87440 Personal history of urinary (tract) infections: Secondary | ICD-10-CM | POA: Insufficient documentation

## 2023-07-31 DIAGNOSIS — I5032 Chronic diastolic (congestive) heart failure: Secondary | ICD-10-CM | POA: Insufficient documentation

## 2023-07-31 NOTE — Patient Instructions (Signed)
 It was good to see you again!  Call us  in the future if you need us  for anything, otherwise, continue to follow closely with Dr Fernand

## 2023-08-03 LAB — GENECONNECT MOLECULAR SCREEN: Genetic Analysis Overall Interpretation: NEGATIVE

## 2023-08-04 ENCOUNTER — Encounter: Payer: Self-pay | Admitting: Family

## 2023-08-04 ENCOUNTER — Encounter
Admission: RE | Disposition: A | Discharge status: Home / Self Care | Payer: Self-pay | Source: Home / Self Care | Attending: Cardiovascular Disease

## 2023-08-04 ENCOUNTER — Ambulatory Visit
Admission: RE | Admit: 2023-08-04 | Discharge: 2023-08-04 | Disposition: A | Discharge status: Home / Self Care | Attending: Cardiovascular Disease | Admitting: Cardiovascular Disease

## 2023-08-04 DIAGNOSIS — I5033 Acute on chronic diastolic (congestive) heart failure: Secondary | ICD-10-CM | POA: Insufficient documentation

## 2023-08-04 DIAGNOSIS — I2510 Atherosclerotic heart disease of native coronary artery without angina pectoris: Secondary | ICD-10-CM | POA: Insufficient documentation

## 2023-08-04 DIAGNOSIS — R079 Chest pain, unspecified: Secondary | ICD-10-CM | POA: Insufficient documentation

## 2023-08-04 DIAGNOSIS — I110 Hypertensive heart disease with heart failure: Secondary | ICD-10-CM | POA: Insufficient documentation

## 2023-08-04 DIAGNOSIS — R9439 Abnormal result of other cardiovascular function study: Secondary | ICD-10-CM

## 2023-08-04 DIAGNOSIS — Z7982 Long term (current) use of aspirin: Secondary | ICD-10-CM | POA: Insufficient documentation

## 2023-08-04 DIAGNOSIS — I4891 Unspecified atrial fibrillation: Secondary | ICD-10-CM | POA: Diagnosis not present

## 2023-08-04 DIAGNOSIS — I200 Unstable angina: Secondary | ICD-10-CM | POA: Diagnosis not present

## 2023-08-04 DIAGNOSIS — R0789 Other chest pain: Secondary | ICD-10-CM | POA: Diagnosis not present

## 2023-08-04 DIAGNOSIS — Z7902 Long term (current) use of antithrombotics/antiplatelets: Secondary | ICD-10-CM | POA: Diagnosis not present

## 2023-08-04 DIAGNOSIS — I10 Essential (primary) hypertension: Secondary | ICD-10-CM

## 2023-08-04 HISTORY — PX: LEFT HEART CATH AND CORONARY ANGIOGRAPHY: CATH118249

## 2023-08-04 SURGERY — LEFT HEART CATH AND CORONARY ANGIOGRAPHY
Anesthesia: Moderate Sedation | Laterality: Left

## 2023-08-04 MED ORDER — FENTANYL CITRATE (PF) 100 MCG/2ML IJ SOLN
INTRAMUSCULAR | Status: DC | PRN
  Administered 2023-08-04: 50 ug via INTRAVENOUS

## 2023-08-04 MED ORDER — SODIUM CHLORIDE 0.9 % WEIGHT BASED INFUSION: 1.0000 mL/kg/h | INTRAVENOUS | Status: DC

## 2023-08-04 MED ORDER — ONDANSETRON HCL 4 MG/2ML IJ SOLN: 4.0000 mg | Freq: Four times a day (QID) | INTRAMUSCULAR | Status: DC | PRN

## 2023-08-04 MED ORDER — LIDOCAINE HCL (PF) 1 % IJ SOLN
INTRAMUSCULAR | Status: DC | PRN
Start: 2023-08-04 — End: 2023-08-04
  Administered 2023-08-04: 10 mL

## 2023-08-04 MED ORDER — SODIUM CHLORIDE 0.9% FLUSH: 3.0000 mL | Freq: Two times a day (BID) | INTRAVENOUS | Status: DC

## 2023-08-04 MED ORDER — SODIUM CHLORIDE 0.9% FLUSH: 3.0000 mL | INTRAVENOUS | Status: DC | PRN

## 2023-08-04 MED ORDER — ACETAMINOPHEN 325 MG PO TABS: 650.0000 mg | ORAL_TABLET | ORAL | Status: DC | PRN

## 2023-08-04 MED ORDER — SODIUM CHLORIDE 0.9 % IV SOLN: 250.0000 mL | INTRAVENOUS | Status: DC | PRN

## 2023-08-04 MED ORDER — ASPIRIN 81 MG PO CHEW
CHEWABLE_TABLET | ORAL | Status: AC
  Filled 2023-08-04: qty 1

## 2023-08-04 MED ORDER — ASPIRIN 81 MG PO CHEW
81.0000 mg | CHEWABLE_TABLET | ORAL | Status: AC
  Administered 2023-08-04: 81 mg via ORAL

## 2023-08-04 MED ORDER — MIDAZOLAM HCL 2 MG/2ML IJ SOLN
INTRAMUSCULAR | Status: DC | PRN
  Administered 2023-08-04: 1 mg via INTRAVENOUS

## 2023-08-04 MED ORDER — IOHEXOL 300 MG/ML  SOLN
INTRAMUSCULAR | Status: DC | PRN
  Administered 2023-08-04: 63 mL

## 2023-08-04 MED ORDER — LABETALOL HCL 5 MG/ML IV SOLN: 10.0000 mg | INTRAVENOUS | Status: DC | PRN

## 2023-08-04 MED ORDER — HEPARIN (PORCINE) IN NACL 1000-0.9 UT/500ML-% IV SOLN
INTRAVENOUS | Status: DC | PRN
  Administered 2023-08-04: 1000 mL

## 2023-08-04 MED ORDER — FENTANYL CITRATE (PF) 100 MCG/2ML IJ SOLN
INTRAMUSCULAR | Status: AC
  Filled 2023-08-04: qty 2

## 2023-08-04 MED ORDER — SODIUM CHLORIDE 0.9 % WEIGHT BASED INFUSION
3.0000 mL/kg/h | INTRAVENOUS | Status: DC
  Administered 2023-08-04: 3 mL/kg/h via INTRAVENOUS

## 2023-08-04 MED ORDER — HYDRALAZINE HCL 20 MG/ML IJ SOLN: 10.0000 mg | INTRAMUSCULAR | Status: DC | PRN

## 2023-08-04 MED ORDER — MIDAZOLAM HCL 2 MG/2ML IJ SOLN
INTRAMUSCULAR | Status: AC
  Filled 2023-08-04: qty 2

## 2023-08-04 SURGICAL SUPPLY — 8 items
CATH INFINITI 5FR MULTPACK ANG (CATHETERS) IMPLANT
NDL PERC 18GX7CM (NEEDLE) IMPLANT
NEEDLE PERC 18GX7CM (NEEDLE) ×1 IMPLANT
PACK CARDIAC CATH (CUSTOM PROCEDURE TRAY) ×1 IMPLANT
SET ATX-X65L (MISCELLANEOUS) IMPLANT
SHEATH AVANTI 5FR X 11CM (SHEATH) IMPLANT
STATION PROTECTION PRESSURIZED (MISCELLANEOUS) IMPLANT
WIRE GUIDERIGHT .035X150 (WIRE) IMPLANT

## 2023-08-04 NOTE — Discharge Instructions (Signed)

## 2023-08-07 ENCOUNTER — Encounter: Payer: Self-pay | Admitting: Cardiovascular Disease

## 2023-08-07 ENCOUNTER — Ambulatory Visit (INDEPENDENT_AMBULATORY_CARE_PROVIDER_SITE_OTHER): Admitting: Cardiovascular Disease

## 2023-08-07 VITALS — BP 110/70 | HR 87 | Ht 74.0 in | Wt 156.8 lb

## 2023-08-07 DIAGNOSIS — I10 Essential (primary) hypertension: Secondary | ICD-10-CM

## 2023-08-07 DIAGNOSIS — R0789 Other chest pain: Secondary | ICD-10-CM | POA: Diagnosis not present

## 2023-08-07 DIAGNOSIS — I4891 Unspecified atrial fibrillation: Secondary | ICD-10-CM

## 2023-08-07 DIAGNOSIS — I5033 Acute on chronic diastolic (congestive) heart failure: Secondary | ICD-10-CM | POA: Diagnosis not present

## 2023-08-07 DIAGNOSIS — Z955 Presence of coronary angioplasty implant and graft: Secondary | ICD-10-CM | POA: Diagnosis not present

## 2023-08-07 MED ORDER — PANTOPRAZOLE SODIUM 40 MG PO TBEC: 40.0000 mg | DELAYED_RELEASE_TABLET | Freq: Every day | ORAL | 11 refills | Status: DC

## 2023-08-07 NOTE — Progress Notes (Signed)
 Cardiology Office Note   Date:  08/07/2023   ID:  [name] Browning, DOB [birth year], MRN [id number]  PCP:  Fernand Fredy RAMAN, MD  Cardiologist:  [name], MD      History of Present Illness: [name] Browning is a 83 y.o. male who presents for  Chief Complaint  Patient presents with   Follow-up    Cath follow up. Has been having chest pain.     Still has chest pain. Cardiac cath was negative with stents in osteal RCA and LAD no restenosis.  Has GERD.      Past Medical History:  Diagnosis Date   Anginal pain (HCC)    Anxiety    Aortic atherosclerosis (HCC)    Atrial fibrillation (HCC) 01/2019   Bilateral carpal tunnel syndrome    CAD (coronary artery disease)    a.) LHC 2004 --> normal coronaries. b.) normal stress test in 2007 and 2011; c.) Lexiscan 05/20/2014 --> LVEF 55-65%; no significant stress induced ischemia/arrythmia. d.) CT chest 03/25/2019 --> coronaries carcified.   Carotid atherosclerosis, bilateral    Carpal tunnel syndrome, bilateral    CHF (congestive heart failure) (HCC)    Chronic anticoagulation    a.) ASA + apixaban    COPD (chronic obstructive pulmonary disease) (HCC)    CVA (cerebral vascular accident) (HCC)    Degenerative disc disease, cervical    Diastolic dysfunction    a.) TTE 05/29/2014 --> LVEF 60-65%; G1DD.   DVT (deep venous thrombosis) (HCC)    GERD (gastroesophageal reflux disease)    History of 2019 novel coronavirus disease (COVID-19) 02/08/2019   HLD (hyperlipidemia)    Hypertension    Kidney stones    Osteoarthritis of right shoulder    Pneumonia    Respiratory failure, acute (HCC) 09/20/2020   a.) severe respiratory distress 1 hour after urological surgery. CXR (+) for acute pulmonary edema. Transferred to ICU and placed on NIPPV. Questionable aspiration PNA. (+) A.fib with RVR. Improved with ABX, diuresis, and amiodarone .   Skin cancer of face    a.) RIGHT ear and RIGHT forehead; excised.   Syncope    TIA (transient ischemic  attack) 2016   Valvular regurgitation    a.) TTE 05/26/2014 --> LVEF 60-65%; trivial MR, mild TR; no AR or PR. b.) TTE 04/20/2015 --> LVEF 55-60%; trivial MR and PR; no AR or TR.     Past Surgical History:  Procedure Laterality Date   CARDIAC CATHETERIZATION  2004   CARPAL TUNNEL RELEASE Right 06/11/2013   CENTRAL LINE INSERTION  11/14/2020   Procedure: CENTRAL LINE INSERTION;  Surgeon: Anner Alm ORN, MD;  Location: [facility] INVASIVE CV LAB;  Service: Cardiovascular;;   CORONARY/GRAFT ACUTE MI REVASCULARIZATION N/A 11/14/2020   Procedure: Coronary/Graft Acute MI Revascularization;  Surgeon: Anner Alm ORN, MD;  Location: [facility] INVASIVE CV LAB;  Service: Cardiovascular;  Laterality: N/A;   CYSTOSCOPY W/ RETROGRADES  10/16/2020   Procedure: CYSTOSCOPY WITH RETROGRADE PYELOGRAM;  Surgeon: Francisca Redell BROCKS, MD;  Location: [facility] ORS;  Service: Urology;;   CYSTOSCOPY W/ URETERAL STENT PLACEMENT Left 09/20/2020   Procedure: CYSTOSCOPY WITH RETROGRADE PYELOGRAM/URETERAL STENT PLACEMENT;  Surgeon: Selma Donnice SAUNDERS, MD;  Location: [facility] ORS;  Service: Urology;  Laterality: Left;   CYSTOSCOPY/URETEROSCOPY/HOLMIUM LASER/STENT PLACEMENT Left 10/16/2020   Procedure: CYSTOSCOPY/URETEROSCOPY/HOLMIUM LASER/STENT PLACEMENT;  Surgeon: Francisca Redell BROCKS, MD;  Location: [facility] ORS;  Service: Urology;  Laterality: Left;   ESOPHAGOGASTRODUODENOSCOPY N/A 11/06/2020   Procedure: ESOPHAGOGASTRODUODENOSCOPY (EGD);  Surgeon: Jinny Carmine, MD;  Location: [facility]  ENDOSCOPY;  Service: Endoscopy;  Laterality: N/A;   LEFT HEART CATH AND CORONARY ANGIOGRAPHY N/A 11/14/2020   Procedure: LEFT HEART CATH AND CORONARY ANGIOGRAPHY;  Surgeon: Anner Alm ORN, MD;  Location: [facility] INVASIVE CV LAB;  Service: Cardiovascular;  Laterality: N/A;   LEFT HEART CATH AND CORONARY ANGIOGRAPHY Left 01/28/2022   Procedure: LEFT HEART CATH AND CORONARY ANGIOGRAPHY;  Surgeon: [name], MD;  Location: [facility] INVASIVE CV LAB;  Service: Cardiovascular;   Laterality: Left;   LEFT HEART CATH AND CORONARY ANGIOGRAPHY Left 08/04/2023   Procedure: LEFT HEART CATH AND CORONARY ANGIOGRAPHY with possible coronary intervention;  Surgeon: [name], MD;  Location: [facility] INVASIVE CV LAB;  Service: Cardiovascular;  Laterality: Left;   SHOULDER ARTHROSCOPY WITH OPEN ROTATOR CUFF REPAIR Right 08/24/2017   Procedure: SHOULDER ARTHROSCOPY WITH OPEN ROTATOR CUFF REPAIR;  Surgeon: Edie Norleen PARAS, MD;  Location: [facility] ORS;  Service: Orthopedics;  Laterality: Right;   SKIN CANCER EXCISION  12/01/2016   right ear    SKIN CANCER EXCISION     remove from the right side of the face    THROMBECTOMY Right 2004   leg   THYROIDECTOMY  1950   Not sure if total or partial thyroidectomy.     Current Outpatient Medications  Medication Sig Dispense Refill   arformoterol  (BROVANA ) 15 MCG/2ML NEBU Take 2 mLs (15 mcg total) by nebulization 2 (two) times daily. 120 mL 10   aspirin  EC 81 MG tablet Take 81 mg by mouth daily after supper.     atorvastatin  (LIPITOR ) 80 MG tablet Take 1 tablet (80 mg total) by mouth daily. 90 tablet 3   budesonide  (PULMICORT ) 0.5 MG/2ML nebulizer solution Take 2 mLs (0.5 mg total) by nebulization 2 (two) times daily. 1440 mL 0   clopidogrel  (PLAVIX ) 75 MG tablet TAKE 1 TABLET BY MOUTH EVERY DAY 90 tablet 3   DULoxetine  (CYMBALTA ) 30 MG capsule Take 1 capsule (30 mg total) by mouth daily. 30 capsule 0   Ensifentrine  (OHTUVAYRE ) 3 MG/2.5ML SUSP Inhale 2.5 mLs into the lungs 2 (two) times daily.     ferrous gluconate  (FERGON) 240 (27 FE) MG tablet Take 1 tablet (240 mg total) by mouth daily. 30 tablet 0   isosorbide  mononitrate (IMDUR ) 30 MG 24 hr tablet Take 1 tablet (30 mg total) by mouth daily. 30 tablet 1   KLOR-CON  M20 20 MEQ tablet TAKE 1 TABLET BY MOUTH EVERY DAY 90 tablet 3   losartan  (COZAAR ) 50 MG tablet Take 50 mg by mouth daily.     Nebulizers (VIOS LC PLUS) MISC      nitroGLYCERIN  (NITROSTAT ) 0.4 MG SL tablet Place 0.4 mg under the  tongue every 5 (five) minutes as needed for chest pain.     ondansetron  (ZOFRAN -ODT) 4 MG disintegrating tablet Take 1 tablet (4 mg total) by mouth every 8 (eight) hours as needed. 20 tablet 0   pantoprazole  (PROTONIX ) 40 MG tablet Take 1 tablet (40 mg total) by mouth daily. 30 tablet 11   ranolazine  (RANEXA ) 1000 MG SR tablet TAKE 1 TABLET BY MOUTH TWICE A DAY 180 tablet 1   roflumilast  (DALIRESP ) 500 MCG TABS tablet Take 1 tablet (500 mcg total) by mouth daily. 30 tablet 3   traMADol  (ULTRAM ) 50 MG tablet Take 50 mg by mouth every 6 (six) hours as needed for severe pain (pain score 7-10).     No current facility-administered medications for this visit.    Allergies:   Hydromorphone   Social History:   reports that he quit smoking about 20 years ago. His smoking use included cigarettes. He started smoking about 66 years ago. He has a 46.1 pack-year smoking history. He has been exposed to tobacco smoke. He quit smokeless tobacco use about 20 years ago.  His smokeless tobacco use included snuff. He reports that he does not currently use alcohol after a past usage of about 7.0 standard drinks of alcohol per week. He reports that he does not use drugs.   Family History:  family history includes CAD in his father; Heart attack in his father; Heart disease in his mother; Hypertension in his mother.    ROS:     Review of Systems  Constitutional: Negative.   HENT: Negative.    Eyes: Negative.   Respiratory: Negative.    Gastrointestinal: Negative.   Genitourinary: Negative.   Musculoskeletal: Negative.   Skin: Negative.   Neurological: Negative.   Endo/Heme/Allergies: Negative.   Psychiatric/Behavioral: Negative.    All other systems reviewed and are negative.     All other systems are reviewed and negative.    PHYSICAL EXAM: VS:  BP 110/70   Pulse 87   Ht 6' 2 (1.88 m)   Wt 156 lb 12.8 oz (71.1 kg)   SpO2 93%   BMI 20.13 kg/m  , BMI Body mass index is 20.13 kg/m. Last  weight:  Wt Readings from Last 3 Encounters:  08/07/23 156 lb 12.8 oz (71.1 kg)  08/04/23 157 lb (71.2 kg)  07/31/23 157 lb 2 oz (71.3 kg)     Physical Exam Vitals reviewed.  Constitutional:      Appearance: Normal appearance. He is normal weight.  HENT:     Head: Normocephalic.     Nose: Nose normal.     Mouth/Throat:     Mouth: Mucous membranes are moist.  Eyes:     Pupils: Pupils are equal, round, and reactive to light.  Cardiovascular:     Rate and Rhythm: Normal rate and regular rhythm.     Pulses: Normal pulses.     Heart sounds: Normal heart sounds.  Pulmonary:     Effort: Pulmonary effort is normal.  Abdominal:     General: Abdomen is flat. Bowel sounds are normal.  Musculoskeletal:        General: Normal range of motion.     Cervical back: Normal range of motion.  Skin:    General: Skin is warm.  Neurological:     General: No focal deficit present.     Mental Status: He is alert.  Psychiatric:        Mood and Affect: Mood normal.       EKG:   Recent Labs: 07/24/2023: ALT 14; BUN 16; Creatinine, Ser 1.31; Hemoglobin 12.0; Platelets 160; Potassium 4.3; Sodium 142    Lipid Panel    Component Value Date/Time   CHOL 283 (H) 11/21/2022 1030   TRIG 98 11/21/2022 1030   HDL 45 11/21/2022 1030   CHOLHDL 3.7 11/12/2020 0435   VLDL 9 11/12/2020 0435   LDLCALC 221 (H) 11/21/2022 1030   LDLCALC 105 (H) 12/07/2016 9041      Other studies Reviewed: Additional studies/ records that were reviewed today include:  Review of the above records demonstrates:       No data to display            ASSESSMENT AND PLAN:    ICD-10-CM   1. S/P coronary artery stent placement  Z95.5 pantoprazole  (  PROTONIX ) 40 MG tablet   cardiac cath revieled stents were fine. reasssured patient.    2. New onset atrial fibrillation (HCC)  I48.91 pantoprazole  (PROTONIX ) 40 MG tablet    3. Other chest pain  R07.89 pantoprazole  (PROTONIX ) 40 MG tablet   Chest  pain due to GERD,  add protonix  40    4. Acute on chronic diastolic CHF (congestive heart failure) (HCC)  I50.33 pantoprazole  (PROTONIX ) 40 MG tablet    5. Essential hypertension, benign  I10 pantoprazole  (PROTONIX ) 40 MG tablet       Problem List Items Addressed This Visit       Cardiovascular and Mediastinum   Essential hypertension, benign   Relevant Medications   pantoprazole  (PROTONIX ) 40 MG tablet   New onset atrial fibrillation (HCC)   Relevant Medications   pantoprazole  (PROTONIX ) 40 MG tablet   Acute on chronic diastolic CHF (congestive heart failure) (HCC)   Relevant Medications   pantoprazole  (PROTONIX ) 40 MG tablet     Other   Chest pain   Relevant Medications   pantoprazole  (PROTONIX ) 40 MG tablet   S/P coronary artery stent placement - Primary   Relevant Medications   pantoprazole  (PROTONIX ) 40 MG tablet       Disposition:   Return in about 2 months (around 10/08/2023).    Total time spent: 35 minutes  Signed,  Denyse Bathe, MD  08/07/2023 11:54 AM    [facility]

## 2023-08-13 ENCOUNTER — Other Ambulatory Visit: Payer: Self-pay | Admitting: Family

## 2023-08-13 ENCOUNTER — Other Ambulatory Visit: Payer: Self-pay | Admitting: Urology

## 2023-08-13 DIAGNOSIS — R399 Unspecified symptoms and signs involving the genitourinary system: Secondary | ICD-10-CM

## 2023-08-15 IMAGING — DX DG CHEST 1V PORT
1 series · 1 of 1 positions shown · non-contrast
Comparison: 10/20/2020

CLINICAL DATA: Respiratory failure, increasing shortness of breath

EXAM:
PORTABLE CHEST 1 VIEW

[chest ap]
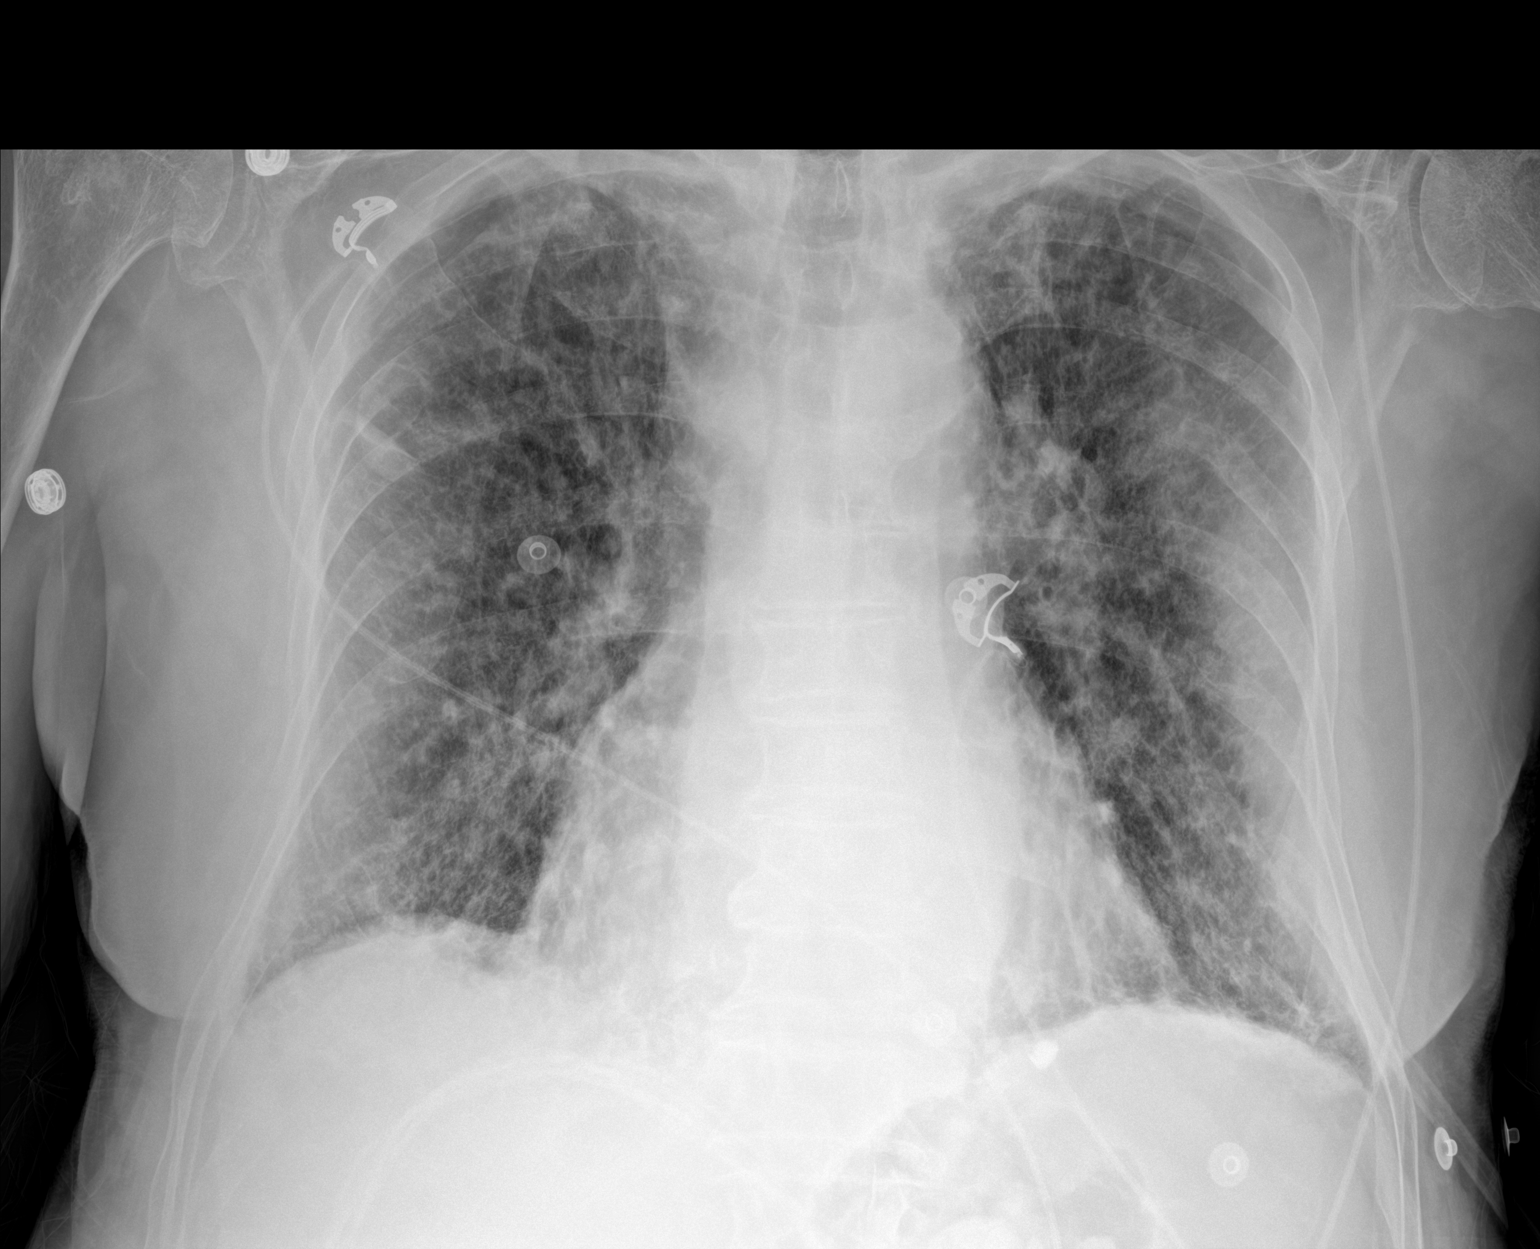

[1 of 1 positions shown; findings below may reference images not displayed]

FINDINGS: Unchanged mildly enlarged cardiac contour. Redemonstrated diffuse
airspace and interstitial opacities bilaterally, which appear
largely unchanged compared to the prior exam. No definite pleural
effusion. Aortic calcifications. No acute osseous abnormality.
IMPRESSION: Unchanged bilateral pulmonary opacities, which could represent edema
or infection.

## 2023-08-21 ENCOUNTER — Other Ambulatory Visit: Payer: Self-pay

## 2023-08-21 ENCOUNTER — Emergency Department
Admission: EM | Admit: 2023-08-21 | Discharge: 2023-08-22 | Disposition: A | Discharge status: Home / Self Care | Attending: Emergency Medicine | Admitting: Emergency Medicine

## 2023-08-21 DIAGNOSIS — Z7901 Long term (current) use of anticoagulants: Secondary | ICD-10-CM | POA: Diagnosis not present

## 2023-08-21 DIAGNOSIS — R300 Dysuria: Secondary | ICD-10-CM | POA: Diagnosis present

## 2023-08-21 DIAGNOSIS — N3091 Cystitis, unspecified with hematuria: Secondary | ICD-10-CM | POA: Diagnosis not present

## 2023-08-21 DIAGNOSIS — N3090 Cystitis, unspecified without hematuria: Secondary | ICD-10-CM

## 2023-08-21 LAB — BASIC METABOLIC PANEL WITH GFR
Anion gap: 10 (ref 5–15)
BUN: 21 mg/dL (ref 8–23)
CO2: 26 mmol/L (ref 22–32)
Calcium: 8.9 mg/dL (ref 8.9–10.3)
Chloride: 100 mmol/L (ref 98–111)
Creatinine, Ser: 1.3 mg/dL — ABNORMAL HIGH (ref 0.61–1.24)
GFR, Estimated: 55 mL/min — ABNORMAL LOW (ref >60)
Glucose, Bld: 93 mg/dL (ref 70–99)
Potassium: 3.3 mmol/L — ABNORMAL LOW (ref 3.5–5.1)
Sodium: 136 mmol/L (ref 135–145)

## 2023-08-21 LAB — URINALYSIS, ROUTINE W REFLEX MICROSCOPIC
Bilirubin Urine: NEGATIVE
Glucose, UA: NEGATIVE mg/dL
Ketones, ur: NEGATIVE mg/dL
Nitrite: NEGATIVE
Protein, ur: NEGATIVE mg/dL
RBC / HPF: >50 RBC/hpf (ref 0–5)
Specific Gravity, Urine: 1.006 (ref 1.005–1.030)
Squamous Epithelial / HPF: 0 /HPF (ref 0–5)
WBC, UA: >50 WBC/hpf (ref 0–5)
pH: 5 (ref 5.0–8.0)

## 2023-08-21 LAB — CBC WITH DIFFERENTIAL/PLATELET
Abs Immature Granulocytes: 0.01 K/uL (ref 0.00–0.07)
Basophils Absolute: 0 K/uL (ref 0.0–0.1)
Basophils Relative: 1 %
Eosinophils Absolute: 0.1 K/uL (ref 0.0–0.5)
Eosinophils Relative: 1 %
HCT: 34.7 % — ABNORMAL LOW (ref 39.0–52.0)
Hemoglobin: 11.3 g/dL — ABNORMAL LOW (ref 13.0–17.0)
Immature Granulocytes: 0 %
Lymphocytes Relative: 15 %
Lymphs Abs: 1 K/uL (ref 0.7–4.0)
MCH: 30.9 pg (ref 26.0–34.0)
MCHC: 32.6 g/dL (ref 30.0–36.0)
MCV: 94.8 fL (ref 80.0–100.0)
Monocytes Absolute: 0.6 K/uL (ref 0.1–1.0)
Monocytes Relative: 10 %
Neutro Abs: 4.7 K/uL (ref 1.7–7.7)
Neutrophils Relative %: 73 %
Platelets: 153 K/uL (ref 150–400)
RBC: 3.66 MIL/uL — ABNORMAL LOW (ref 4.22–5.81)
RDW: 13.8 % (ref 11.5–15.5)
WBC: 6.4 K/uL (ref 4.0–10.5)
nRBC: 0 % (ref 0.0–0.2)

## 2023-08-21 NOTE — ED Triage Notes (Signed)
 Pt reports hematuria that began earlier today, pt also reports some dysuria. Pt states he takes a blood thinner.

## 2023-08-22 DIAGNOSIS — N3091 Cystitis, unspecified with hematuria: Secondary | ICD-10-CM | POA: Diagnosis not present

## 2023-08-22 MED ORDER — AMOXICILLIN-POT CLAVULANATE 875-125 MG PO TABS: 1.0000 | ORAL_TABLET | Freq: Two times a day (BID) | ORAL | 0 refills | Status: AC

## 2023-08-22 MED ORDER — AMOXICILLIN-POT CLAVULANATE 875-125 MG PO TABS
1.0000 | ORAL_TABLET | Freq: Once | ORAL | Status: AC
  Administered 2023-08-22 (×2): 1 via ORAL
  Filled 2023-08-22: qty 1

## 2023-08-22 NOTE — Discharge Instructions (Addendum)
 Take antibiotic for full 7-day course and call your doctor for an appointment for checkup this week.  Thank you for choosing us  for your health care today!  Please see your primary doctor this week for a follow up appointment.   If you have any new, worsening, or unexpected symptoms call your doctor right away or come back to the emergency department for reevaluation.  It was my pleasure to care for you today.   Ginnie EDISON Cyrena, MD

## 2023-08-22 NOTE — ED Provider Notes (Signed)
 [facility] Provider Note    Event Date/Time   First MD Initiated Contact with Patient 08/22/23 [phone]     (approximate)   History   Hematuria   HPI  [name] Browning is a 83 y.o. male   Past medical history of multiple medical comorbidities here today with dysuria.  Over the last 2 days have developed burning in his penis with urination.  No systemic symptoms like fevers, chills, abdominal pain, flank pain.  He had a urinary tract infection about a month ago.  External Medical Documents Reviewed: Urine cultures from June show multidrug-resistant E. coli but is sensitive to both Macrobid  and ampicillin  sulbactam      Physical Exam   Triage Vital Signs: ED Triage Vitals  Encounter Vitals Group     BP 08/21/23 1940 125/88     Girls Systolic BP Percentile --      Girls Diastolic BP Percentile --      Boys Systolic BP Percentile --      Boys Diastolic BP Percentile --      Pulse Rate 08/21/23 1940 98     Resp 08/21/23 1940 19     Temp 08/21/23 1940 98 F (36.7 C)     Temp Source 08/22/23 0010 Oral     SpO2 08/21/23 1940 95 %     Weight 08/21/23 1939 159 lb (72.1 kg)     Height 08/21/23 1939 6' 2 (1.88 m)     Head Circumference --      Peak Flow --      Pain Score 08/21/23 1939 0     Pain Loc --      Pain Education --      Exclude from Growth Chart --     Most recent vital signs: Vitals:   08/21/23 1940 08/22/23 0010  BP: 125/88 117/67  Pulse: 98 (!) 56  Resp: 19 19  Temp: 98 F (36.7 C) 98.1 F (36.7 C)  SpO2: 95% 100%    General: Awake, no distress.  CV:  Good peripheral perfusion.  Resp:  Normal effort.  Abd:  No distention.  Other:  Awake alert comfortable with normal vital signs, afebrile nontoxic.SABRA  Soft benign abdominal exam   ED Results / Procedures / Treatments   Labs (all labs ordered are listed, but only abnormal results are displayed) Labs Reviewed  CBC WITH DIFFERENTIAL/PLATELET - Abnormal; Notable for the  following components:      Result Value   RBC 3.66 (*)    Hemoglobin 11.3 (*)    HCT 34.7 (*)    All other components within normal limits  BASIC METABOLIC PANEL WITH GFR - Abnormal; Notable for the following components:   Potassium 3.3 (*)    Creatinine, Ser 1.30 (*)    GFR, Estimated 55 (*)    All other components within normal limits  URINALYSIS, ROUTINE W REFLEX MICROSCOPIC - Abnormal; Notable for the following components:   Color, Urine YELLOW (*)    APPearance CLOUDY (*)    Hgb urine dipstick LARGE (*)    Leukocytes,Ua LARGE (*)    Bacteria, UA MANY (*)    All other components within normal limits     I ordered and reviewed the above labs they are notable for no leukocytosis.  Urine does show bacteria and leukocytes.  GFR below 60.    PROCEDURES:  Critical Care performed: No  Procedures   MEDICATIONS ORDERED IN ED: Medications  amoxicillin -clavulanate (AUGMENTIN ) 875-125 MG per  tablet 1 tablet (1 tablet Oral Given 08/22/23 0121)     IMPRESSION / MDM / ASSESSMENT AND PLAN / ED COURSE  I reviewed the triage vital signs and the nursing notes.                                Patient's presentation is most consistent with acute presentation with potential threat to life or bodily function.  Differential diagnosis includes, but is not limited to, cystitis considered but less likely pyelonephritis sepsis prostatitis   MDM:    Symptoms consistent with uncomplicated urethritis/cystitis for 2 days without signs of systemic infection or pyelonephritis.  Complicated by the fact that he had multidrug-resistant E. coli on urine culture 2 months ago but did appear to be susceptible to Macrobid  and ampicillin  sulbactam.  Given his poor GFR at baseline, opted for Augmentin  therapy.  He knows to follow-up very closely with his primary doctor this week for an appointment for a checkup.  He also understands to return with any new or worsening symptoms.  I considered  hospitalization for admission or observation given his age and comorbidities with urinary tract infection however given that his symptoms are limited to urethritis/cystitis and he has no systemic signs of infection to suggest pyelonephritis sepsis or other life-threatening emergencies at this time I think he is amenable for outpatient therapy.        FINAL CLINICAL IMPRESSION(S) / ED DIAGNOSES   Final diagnoses:  Cystitis     Rx / DC Orders   ED Discharge Orders          Ordered    amoxicillin -clavulanate (AUGMENTIN ) 875-125 MG tablet  2 times daily        08/22/23 0118             Note:  This document was prepared using Dragon voice recognition software and may include unintentional dictation errors.    Cyrena Mylar, MD 08/22/23 [phone]

## 2023-08-31 ENCOUNTER — Other Ambulatory Visit: Payer: Self-pay | Admitting: Cardiovascular Disease

## 2023-08-31 ENCOUNTER — Other Ambulatory Visit: Payer: Self-pay | Admitting: Urology

## 2023-08-31 ENCOUNTER — Other Ambulatory Visit: Payer: Self-pay | Admitting: Internal Medicine

## 2023-08-31 ENCOUNTER — Other Ambulatory Visit: Payer: Self-pay | Admitting: Family

## 2023-08-31 DIAGNOSIS — Z955 Presence of coronary angioplasty implant and graft: Secondary | ICD-10-CM

## 2023-08-31 DIAGNOSIS — I5032 Chronic diastolic (congestive) heart failure: Secondary | ICD-10-CM

## 2023-08-31 DIAGNOSIS — R079 Chest pain, unspecified: Secondary | ICD-10-CM

## 2023-08-31 DIAGNOSIS — I10 Essential (primary) hypertension: Secondary | ICD-10-CM

## 2023-08-31 DIAGNOSIS — J449 Chronic obstructive pulmonary disease, unspecified: Secondary | ICD-10-CM

## 2023-08-31 DIAGNOSIS — I480 Paroxysmal atrial fibrillation: Secondary | ICD-10-CM

## 2023-09-01 ENCOUNTER — Other Ambulatory Visit: Payer: Self-pay | Admitting: Family

## 2023-09-01 NOTE — Telephone Encounter (Signed)
 Per Dr. Isaiah has not refill. He needs to take the Ohtuvayre  and not the Roflumilast  until his next appt on 9/19. We will re-evaluate his medications then.  I have notified April (DPR).  Nothing further needed.

## 2023-09-01 NOTE — Progress Notes (Signed)
 Pt and pt's daughter called requesting refill on Torsemide . Pt takes Torsemide  20 MG TID. Pt lasts rx was sent incorrectly and they wanted to verify that the correct rx would be sent in. Medication sent in to [facility] on [address].

## 2023-09-12 DIAGNOSIS — I5032 Chronic diastolic (congestive) heart failure: Secondary | ICD-10-CM | POA: Diagnosis not present

## 2023-09-12 DIAGNOSIS — N189 Chronic kidney disease, unspecified: Secondary | ICD-10-CM | POA: Diagnosis not present

## 2023-09-12 DIAGNOSIS — J449 Chronic obstructive pulmonary disease, unspecified: Secondary | ICD-10-CM | POA: Diagnosis not present

## 2023-09-12 DIAGNOSIS — R252 Cramp and spasm: Secondary | ICD-10-CM | POA: Diagnosis not present

## 2023-09-12 DIAGNOSIS — R300 Dysuria: Secondary | ICD-10-CM | POA: Diagnosis not present

## 2023-09-12 DIAGNOSIS — N401 Enlarged prostate with lower urinary tract symptoms: Secondary | ICD-10-CM | POA: Diagnosis not present

## 2023-09-12 DIAGNOSIS — R350 Frequency of micturition: Secondary | ICD-10-CM | POA: Diagnosis not present

## 2023-09-29 ENCOUNTER — Encounter: Payer: Self-pay | Admitting: Internal Medicine

## 2023-09-29 ENCOUNTER — Ambulatory Visit: Admitting: Internal Medicine

## 2023-09-29 VITALS — BP 110/60 | HR 83 | Temp 98.2°F | Ht 72.0 in | Wt 154.0 lb

## 2023-09-29 DIAGNOSIS — Z87891 Personal history of nicotine dependence: Secondary | ICD-10-CM | POA: Diagnosis not present

## 2023-09-29 DIAGNOSIS — Z8616 Personal history of COVID-19: Secondary | ICD-10-CM | POA: Diagnosis not present

## 2023-09-29 DIAGNOSIS — J9611 Chronic respiratory failure with hypoxia: Secondary | ICD-10-CM

## 2023-09-29 DIAGNOSIS — R0689 Other abnormalities of breathing: Secondary | ICD-10-CM

## 2023-09-29 DIAGNOSIS — J449 Chronic obstructive pulmonary disease, unspecified: Secondary | ICD-10-CM

## 2023-09-29 NOTE — Progress Notes (Signed)
 [name] Browning, male    DOB: Jun 08, 1940,  MRN: [id number]   SYNOPSIS 83 year old male, former smoker followed for COPD with chronic bronchitis and emphysema and chronic respiratory failure on supplemental O2.  Past medical history significant for HTN, PAF, CHF, hx of DVT, hx of STEMI, allergic rhinitis, GERD, CKD, BPH, HLD. PREVIOUS ADMISSIONS First admission was from 10/20/2020 to 10/31/2020 when he was admitted with respiratory failure due to COPD and pneumonia, as well as an obstructive left renal stone.  He developed hypotension and worsening respiratory distress requiring pressors and BiPAP.     Blood cultures grew positive for Enterococcus faecalis and staph hemolyticus.  He was treated with antibiotics ending 11/03/2020.  The patient was recently admitted to the hospital from 10/25-10/28 with complaints of dysphagia.     He was seen by gastroenterology who underwent an EGD with no significant findings.  Since that time he has had progressive worsening shortness of breath, eventually presented to the emergency department where he was found to have an oxygen  saturation of 88% on his home 2 L of oxygen .  His labs are otherwise notable for a troponin of 620, BNP of 220, hemoglobin of 8.2 (9.7 on 11/06/2020).  Chest x-ray shows diffuse bilateral infiltrates which could be consistent with edema but some overlying scar.   TESTS/EVENTS: 11/15/2020 echo: EF 60 to 65%.  G1 DD.  RV function severely reduced and enlarged.  LA severely dilated.  RA severely dilated.  Mild MR. 07/03/2022 CTA chest: No PE.  Atherosclerosis/CAD.  Cardiomegaly.  Emphysema.  Nodular biapical scarring, unchanged.  Mild bilateral gynecomastia  06/09/2022: OV Significant SOB/DOE. Chronic productive cough/chest congestion over the last 8 months. Several hospitalizations over the last year. Progressive respiratory disease. Chronic hypoxic respiratory failure; currently on 5 lpm. COPD progressed to end stage with previous infections  and hospitalizations. Started on Breztri  inhaler previously. Was also given 20 mg daily at last OV. Started on daliresp . Recommended pulmonary rehab.    June 2025 Ambulating pulse oximetry in the office  showed hypoxia patient will need oxygen  therapy with exertion    CC Follow-up assess for COPD Follow-up assessment for chronic hypoxic respiratory failure Follow-up assessment for respiratory insufficiency  HPI Progressive chronic shortness of breath and dyspnea exertion over the last several years Patient did not tolerate phosphodiesterase inhibitor nebulized therapy Patient started on Pulmicort  and Brovana  nebulizers Patient started on Daliresp    Patient with chronic productive cough and chest congestion Several hospitalizations over the last several years  No exacerbation at this time No evidence of heart failure at this time No evidence or signs of infection at this time No respiratory distress No fevers, chills, nausea, vomiting, diarrhea No evidence of lower extremity edema No evidence hemoptysis  Chronic Hypoxic resp failure due to COPD -Patient benefits from oxygen  therapy 2L [place]  -recommend using oxygen  as prescribed -patient needs this for survival Chronic hypoxic respiratory failure due to COPD   I have recommended that he bring his granddaughter to the next office visit    Past Medical History:  Diagnosis Date   Anginal pain (HCC)    Anxiety    Aortic atherosclerosis (HCC)    Atrial fibrillation (HCC) 01/2019   Bilateral carpal tunnel syndrome    CAD (coronary artery disease)    a.) LHC 2004 --> normal coronaries. b.) normal stress test in 2007 and 2011; c.) Lexiscan 05/20/2014 --> LVEF 55-65%; no significant stress induced ischemia/arrythmia. d.) CT chest 03/25/2019 --> coronaries carcified.  Carotid atherosclerosis, bilateral    Carpal tunnel syndrome, bilateral    CHF (congestive heart failure) (HCC)    Chronic anticoagulation    a.) ASA + apixaban     COPD (chronic obstructive pulmonary disease) (HCC)    CVA (cerebral vascular accident) (HCC)    Degenerative disc disease, cervical    Diastolic dysfunction    a.) TTE 05/29/2014 --> LVEF 60-65%; G1DD.   DVT (deep venous thrombosis) (HCC)    GERD (gastroesophageal reflux disease)    History of 2019 novel coronavirus disease (COVID-19) 02/08/2019   HLD (hyperlipidemia)    Hypertension    Kidney stones    Osteoarthritis of right shoulder    Pneumonia    Respiratory failure, acute (HCC) 09/20/2020   a.) severe respiratory distress 1 hour after urological surgery. CXR (+) for acute pulmonary edema. Transferred to ICU and placed on NIPPV. Questionable aspiration PNA. (+) A.fib with RVR. Improved with ABX, diuresis, and amiodarone .   Skin cancer of face    a.) RIGHT ear and RIGHT forehead; excised.   Syncope    TIA (transient ischemic attack) 2016   Valvular regurgitation    a.) TTE 05/26/2014 --> LVEF 60-65%; trivial MR, mild TR; no AR or PR. b.) TTE 04/20/2015 --> LVEF 55-60%; trivial MR and PR; no AR or TR.    Outpatient Medications Prior to Visit  Medication Sig Dispense Refill   acetaminophen  (TYLENOL ) 500 MG tablet Take 2 tablets (1,000 mg total) by mouth every 6 (six) hours as needed for mild pain.     amLODipine  (NORVASC ) 5 MG tablet Take 1 tablet (5 mg total) by mouth daily after supper. 30 tablet 2   apixaban  (ELIQUIS ) 5 MG TABS tablet Take 1 tablet (5 mg total) by mouth 2 (two) times daily. 60 tablet 2   aspirin  EC 81 MG tablet Take 81 mg by mouth daily after supper.     atorvastatin  (LIPITOR ) 40 MG tablet Take 40 mg by mouth daily after supper.     Ferrous Gluconate  (IRON ) 240 (27 Fe) MG TABS Take 27 mg by mouth daily after supper.     furosemide  (LASIX ) 20 MG tablet Take 1 tablet (20 mg total) by mouth daily after breakfast. 30 tablet 2   guaiFENesin -dextromethorphan  (ROBITUSSIN DM) 100-10 MG/5ML syrup Take 5 mLs by mouth every 4 (four) hours as needed for cough. 118 mL 0    losartan  (COZAAR ) 100 MG tablet Take 100 mg by mouth daily after supper.     montelukast  (SINGULAIR ) 10 MG tablet Take 10 mg by mouth at bedtime.     pantoprazole  (PROTONIX ) 40 MG tablet Take 40 mg by mouth daily after supper.     phenazopyridine  (PYRIDIUM ) 200 MG tablet Take 1 tablet (200 mg total) by mouth 3 (three) times daily as needed (bladder pain). 10 tablet 0   polyethylene glycol (MIRALAX  / GLYCOLAX ) 17 g packet Take 17 g by mouth daily as needed for mild constipation or moderate constipation. (Patient taking differently: Take 17 g by mouth daily.) 14 each 0                  No facility-administered medications prior to visit.   BP 110/60   Pulse 83   Temp 98.2 F (36.8 C)   Ht 6' (1.829 m)   Wt 154 lb (69.9 kg)   SpO2 94%   BMI 20.89 kg/m      Review of Systems: Gen:  Denies  fever, sweats, chills weight loss  HEENT: Denies  blurred vision, double vision, ear pain, eye pain, hearing loss, nose bleeds, sore throat Cardiac:  No dizziness, chest pain or heaviness, chest tightness,edema, No JVD Resp:   No cough, -sputum production, -shortness of breath,-wheezing, -hemoptysis,  Other:  All other systems negative   Physical Examination:   General Appearance: No distress  EYES PERRLA, EOM intact.   NECK Supple, No JVD Pulmonary: normal breath sounds, No wheezing.  CardiovascularNormal S1,S2.  No m/r/g.   Abdomen: Benign, Soft, non-tender. Neurology UE/LE 5/5 strength, no focal deficits Ext pulses intact, cap refill intact ALL OTHER ROS ARE NEGATIVE       Labs ordered/ reviewed:      Chemistry      Component Value Date/Time   NA 136 08/21/2023 1941   NA 142 07/24/2023 1344   NA 144 08/12/2013 0546   K 3.3 (L) 08/21/2023 1941   K 4.5 08/12/2013 0546   CL 100 08/21/2023 1941   CL 113 (H) 08/12/2013 0546   CO2 26 08/21/2023 1941   CO2 26 08/12/2013 0546   BUN 21 08/21/2023 1941   BUN 16 07/24/2023 1344   BUN 25 (H) 08/12/2013 0546   CREATININE 1.30 (H)  08/21/2023 1941   CREATININE 1.00 06/08/2016 1044      Component Value Date/Time   CALCIUM  8.9 08/21/2023 1941   CALCIUM  8.3 (L) 08/12/2013 0546   ALKPHOS 240 (H) 07/24/2023 1344   ALKPHOS 112 08/10/2013 1703   AST 23 07/24/2023 1344   AST 27 08/10/2013 1703   ALT 14 07/24/2023 1344   ALT 22 08/10/2013 1703   BILITOT 0.8 07/24/2023 1344   BILITOT 0.7 08/10/2013 1703            Assessment   83 year old pleasant white male seen today for follow-up assessment for severe end-stage COPD with previous bouts of COVID-19 infection with progressive respiratory sufficiency over the last several years with chronic hypoxic respite failure with diastolic dysfunction  Overall patient with poor prognosis   End-stage COPD Patient progressed to respiratory insufficiency Patient no longer can take traditional inhaler therapy Patient did not tolerate phosphodiesterase nebulizer Continue Pulmicort  nebs Continue Brovana  nebs Avoid Allergens and Irritants Avoid secondhand smoke Avoid SICK contacts Recommend  Masking  when appropriate Recommend Keep up-to-date with vaccinations  Chronic Hypoxic resp failure due to COPD -Patient benefits from oxygen  therapy 2L   -recommend using oxygen  as prescribed -patient needs this for survival Ambulating pulse oximetry in the office showed significant hypoxia with ambulation  Severe respiratory insufficiency end-stage COPD Continue Daliresp  500 mg daily Oral phosphodiesterase inhibitor No exacerbation at this time No evidence of heart failure at this time No evidence or signs of infection at this time No respiratory distress No fevers, chills, nausea, vomiting, diarrhea No evidence of lower extremity edema No evidence hemoptysis     MEDICATION ADJUSTMENTS/LABS AND TESTS ORDERED: Continue oxygen  as prescribed  Continue Daliresp   Continue nebulized therapy  Recommend granddaughter at next office visit  Avoid Allergens and Irritants Avoid  secondhand smoke Avoid SICK contacts Recommend  Masking  when appropriate Recommend Keep up-to-date with vaccinations   CURRENT MEDICATIONS REVIEWED AT LENGTH WITH PATIENT TODAY   Patient  satisfied with Plan of action and management. All questions answered   Follow up 6 months   I spent a total of 45 minutes dedicated to the care of this patient on the date of this encounter to include pre-visit review of records, face-to-face time with the patient discussing conditions above, post visit ordering of  testing, clinical documentation with the electronic health record, making appropriate referrals as documented, and communicating necessary information to the patient's healthcare team.    The Patient requires high complexity decision making for assessment and support, frequent evaluation and titration of therapies, application of advanced monitoring technologies and extensive interpretation of multiple databases.  Patient satisfied with Plan of action and management. All questions answered    Nickolas Alm Cellar, M.D.  [facility]  Medical Director [facility] Medical Director [facility] Cardio-Pulmonary Department

## 2023-09-29 NOTE — Patient Instructions (Signed)
 Great job with your breathing treatments! No signs of lung infection at this time Continue oxygen  as prescribed  Avoid Allergens and Irritants Avoid secondhand smoke Avoid SICK contacts Recommend  Masking  when appropriate Recommend Keep up-to-date with vaccinations

## 2023-10-09 ENCOUNTER — Ambulatory Visit: Admitting: Cardiovascular Disease

## 2023-10-09 DIAGNOSIS — N390 Urinary tract infection, site not specified: Secondary | ICD-10-CM | POA: Diagnosis not present

## 2023-10-10 ENCOUNTER — Ambulatory Visit (INDEPENDENT_AMBULATORY_CARE_PROVIDER_SITE_OTHER): Admitting: Cardiovascular Disease

## 2023-10-10 ENCOUNTER — Encounter: Payer: Self-pay | Admitting: Cardiovascular Disease

## 2023-10-10 VITALS — BP 107/71 | HR 69 | Ht 72.0 in | Wt 156.6 lb

## 2023-10-10 DIAGNOSIS — E782 Mixed hyperlipidemia: Secondary | ICD-10-CM | POA: Diagnosis not present

## 2023-10-10 DIAGNOSIS — Z955 Presence of coronary angioplasty implant and graft: Secondary | ICD-10-CM

## 2023-10-10 DIAGNOSIS — I214 Non-ST elevation (NSTEMI) myocardial infarction: Secondary | ICD-10-CM | POA: Diagnosis not present

## 2023-10-10 DIAGNOSIS — I5032 Chronic diastolic (congestive) heart failure: Secondary | ICD-10-CM | POA: Diagnosis not present

## 2023-10-10 DIAGNOSIS — I480 Paroxysmal atrial fibrillation: Secondary | ICD-10-CM

## 2023-10-10 MED ORDER — DAPAGLIFLOZIN PROPANEDIOL 10 MG PO TABS
10.0000 mg | ORAL_TABLET | Freq: Every day | ORAL | 3 refills | Status: AC
Start: 2023-10-10 — End: ?

## 2023-10-10 NOTE — Progress Notes (Addendum)
 Cardiology Office Note   Date:  10/10/2023   ID:  [name] Browning 05-03-1940, MRN [id number]  PCP:  Fernand Fredy RAMAN, MD  Cardiologist:  [name], MD      History of Present Illness: [name] Browning is a 83 y.o. male who presents for  Chief Complaint  Patient presents with   Follow-up    2 month follow up    Has no issues.      Past Medical History:  Diagnosis Date   Anginal pain    Anxiety    Aortic atherosclerosis    Atrial fibrillation (HCC) 01/2019   Bilateral carpal tunnel syndrome    CAD (coronary artery disease)    a.) LHC 2004 --> normal coronaries. b.) normal stress test in 2007 and 2011; c.) Lexiscan 05/20/2014 --> LVEF 55-65%; no significant stress induced ischemia/arrythmia. d.) CT chest 03/25/2019 --> coronaries carcified.   Carotid atherosclerosis, bilateral    Carpal tunnel syndrome, bilateral    CHF (congestive heart failure) (HCC)    Chronic anticoagulation    a.) ASA + apixaban    COPD (chronic obstructive pulmonary disease) (HCC)    CVA (cerebral vascular accident) (HCC)    Degenerative disc disease, cervical    Diastolic dysfunction    a.) TTE 05/29/2014 --> LVEF 60-65%; G1DD.   DVT (deep venous thrombosis) (HCC)    GERD (gastroesophageal reflux disease)    History of 2019 novel coronavirus disease (COVID-19) 02/08/2019   HLD (hyperlipidemia)    Hypertension    Kidney stones    Osteoarthritis of right shoulder    Pneumonia    Respiratory failure, acute (HCC) 09/20/2020   a.) severe respiratory distress 1 hour after urological surgery. CXR (+) for acute pulmonary edema. Transferred to ICU and placed on NIPPV. Questionable aspiration PNA. (+) A.fib with RVR. Improved with ABX, diuresis, and amiodarone .   Skin cancer of face    a.) RIGHT ear and RIGHT forehead; excised.   Syncope    TIA (transient ischemic attack) 2016   Valvular regurgitation    a.) TTE 05/26/2014 --> LVEF 60-65%; trivial MR, mild TR; no AR or PR. b.) TTE 04/20/2015  --> LVEF 55-60%; trivial MR and PR; no AR or TR.     Past Surgical History:  Procedure Laterality Date   CARDIAC CATHETERIZATION  2004   CARPAL TUNNEL RELEASE Right 06/11/2013   CENTRAL LINE INSERTION  11/14/2020   Procedure: CENTRAL LINE INSERTION;  Surgeon: Anner Alm ORN, MD;  Location[facility] INVASIVE CV LAB;  Service: Cardiovascular;;   CORONARY/GRAFT ACUTE MI REVASCULARIZATION N/A 11/14/2020   Procedure: Coronary/Graft Acute MI Revascularization;  Surgeon: Anner Alm ORN, MD;  Location: [facility] INVASIVE CV LAB;  Service: Cardiovascular;  Laterality: N/A;   CYSTOSCOPY W/ RETROGRADES  10/16/2020   Procedure: CYSTOSCOPY WITH RETROGRADE PYELOGRAM;  Surgeon: Francisca Redell BROCKS, MD;  Location: [facility] ORS;  Service: Urology;;   CYSTOSCOPY W/ URETERAL STENT PLACEMENT Left 09/20/2020   Procedure: CYSTOSCOPY WITH RETROGRADE PYELOGRAM/URETERAL STENT PLACEMENT;  Surgeon: Selma Donnice SAUNDERS, MD;  Location: [facility] ORS;  Service: Urology;  Laterality: Left;   CYSTOSCOPY/URETEROSCOPY/HOLMIUM LASER/STENT PLACEMENT Left 10/16/2020   Procedure: CYSTOSCOPY/URETEROSCOPY/HOLMIUM LASER/STENT PLACEMENT;  Surgeon: Francisca Redell BROCKS, MD;  Location: [facility] ORS;  Service: Urology;  Laterality: Left;   ESOPHAGOGASTRODUODENOSCOPY N/A 11/06/2020   Procedure: ESOPHAGOGASTRODUODENOSCOPY (EGD);  Surgeon: Jinny Carmine, MD;  Location: [facility] ENDOSCOPY;  Service: Endoscopy;  Laterality: N/A;   LEFT HEART CATH AND CORONARY ANGIOGRAPHY N/A 11/14/2020   Procedure: LEFT HEART CATH AND  CORONARY ANGIOGRAPHY;  Surgeon: Anner Alm ORN, MD;  Location: [facility] INVASIVE CV LAB;  Service: Cardiovascular;  Laterality: N/A;   LEFT HEART CATH AND CORONARY ANGIOGRAPHY Left 01/28/2022   Procedure: LEFT HEART CATH AND CORONARY ANGIOGRAPHY;  Surgeon: [name], MD;  Location: [facility] INVASIVE CV LAB;  Service: Cardiovascular;  Laterality: Left;   LEFT HEART CATH AND CORONARY ANGIOGRAPHY Left 08/04/2023   Procedure: LEFT HEART CATH AND CORONARY ANGIOGRAPHY with  possible coronary intervention;  Surgeon: [name], MD;  Location: [facility] INVASIVE CV LAB;  Service: Cardiovascular;  Laterality: Left;   SHOULDER ARTHROSCOPY WITH OPEN ROTATOR CUFF REPAIR Right 08/24/2017   Procedure: SHOULDER ARTHROSCOPY WITH OPEN ROTATOR CUFF REPAIR;  Surgeon: Edie Norleen PARAS, MD;  Location: [facility] ORS;  Service: Orthopedics;  Laterality: Right;   SKIN CANCER EXCISION  12/01/2016   right ear    SKIN CANCER EXCISION     remove from the right side of the face    THROMBECTOMY Right 2004   leg   THYROIDECTOMY  1950   Not sure if total or partial thyroidectomy.     Current Outpatient Medications  Medication Sig Dispense Refill   arformoterol  (BROVANA ) 15 MCG/2ML NEBU Take 2 mLs (15 mcg total) by nebulization 2 (two) times daily. 120 mL 10   aspirin  EC 81 MG tablet Take 81 mg by mouth daily after supper.     atorvastatin  (LIPITOR ) 80 MG tablet Take 1 tablet (80 mg total) by mouth daily. 90 tablet 3   budesonide  (PULMICORT ) 0.5 MG/2ML nebulizer solution Take 2 mLs (0.5 mg total) by nebulization 2 (two) times daily. 1440 mL 0   cefdinir  (OMNICEF ) 300 MG capsule Take 300 mg by mouth 2 (two) times daily.     clopidogrel  (PLAVIX ) 75 MG tablet TAKE 1 TABLET BY MOUTH EVERY DAY 90 tablet 3   dapagliflozin propanediol (FARXIGA) 10 MG TABS tablet Take 1 tablet (10 mg total) by mouth daily before breakfast. 30 tablet 3   DULoxetine  (CYMBALTA ) 30 MG capsule Take 1 capsule (30 mg total) by mouth daily. 30 capsule 0   Ensifentrine  (OHTUVAYRE ) 3 MG/2.5ML SUSP Inhale 2.5 mLs into the lungs 2 (two) times daily.     ferrous gluconate  (FERGON) 240 (27 FE) MG tablet Take 1 tablet (240 mg total) by mouth daily. 30 tablet 0   finasteride  (PROSCAR ) 5 MG tablet TAKE 1 TABLET (5 MG TOTAL) BY MOUTH DAILY. 90 tablet 3   isosorbide  mononitrate (IMDUR ) 30 MG 24 hr tablet Take 1 tablet (30 mg total) by mouth daily. 30 tablet 1   ketorolac  (ACULAR ) 0.5 % ophthalmic solution Place 1 drop into the left eye  4 (four) times daily.     losartan  (COZAAR ) 50 MG tablet TAKE 1 TABLET BY MOUTH EVERY DAY 90 tablet 1   metoprolol  succinate (TOPROL -XL) 25 MG 24 hr tablet TAKE 1 TABLET (25 MG TOTAL) BY MOUTH DAILY. 90 tablet 3   moxifloxacin (VIGAMOX) 0.5 % ophthalmic solution Place 1 drop into the left eye 4 (four) times daily.     Nebulizers (VIOS LC PLUS) MISC      nitroGLYCERIN  (NITROSTAT ) 0.4 MG SL tablet Place 0.4 mg under the tongue every 5 (five) minutes as needed for chest pain.     ondansetron  (ZOFRAN -ODT) 4 MG disintegrating tablet Take 1 tablet (4 mg total) by mouth every 8 (eight) hours as needed. 20 tablet 0   pantoprazole  (PROTONIX ) 40 MG tablet Take 1 tablet (40 mg total) by mouth daily. 30 tablet  11   potassium chloride  SA (KLOR-CON  M) 20 MEQ tablet TAKE 1 TABLET BY MOUTH EVERY DAY 90 tablet 3   ranolazine  (RANEXA ) 1000 MG SR tablet TAKE 1 TABLET BY MOUTH TWICE A DAY 180 tablet 1   roflumilast  (DALIRESP ) 500 MCG TABS tablet Take 1 tablet (500 mcg total) by mouth daily. 30 tablet 3   tamsulosin  (FLOMAX ) 0.4 MG CAPS capsule TAKE 1 CAPSULE BY MOUTH EVERY DAY 90 capsule 0   torsemide  (DEMADEX ) 20 MG tablet Take 3 tablets (60 mg total) by mouth daily. 90 tablet 5   traMADol  (ULTRAM ) 50 MG tablet Take 50 mg by mouth every 6 (six) hours as needed for severe pain (pain score 7-10).     No current facility-administered medications for this visit.    Allergies:   Hydromorphone     Social History:   reports that he quit smoking about 20 years ago. His smoking use included cigarettes. He started smoking about 66 years ago. He has a 46.1 pack-year smoking history. He has been exposed to tobacco smoke. He quit smokeless tobacco use about 20 years ago.  His smokeless tobacco use included snuff. He reports that he does not currently use alcohol after a past usage of about 7.0 standard drinks of alcohol per week. He reports that he does not use drugs.   Family History:  family history includes CAD in his  father; Heart attack in his father; Heart disease in his mother; Hypertension in his mother.    ROS:     Review of Systems  Constitutional: Negative.   HENT: Negative.    Eyes: Negative.   Respiratory: Negative.    Gastrointestinal: Negative.   Genitourinary: Negative.   Musculoskeletal: Negative.   Skin: Negative.   Neurological: Negative.   Endo/Heme/Allergies: Negative.   Psychiatric/Behavioral: Negative.    All other systems reviewed and are negative.     All other systems are reviewed and negative.    PHYSICAL EXAM: VS:  BP 107/71   Pulse 69   Ht 6' (1.829 m)   Wt 156 lb 9.6 oz (71 kg)   SpO2 92%   BMI 21.24 kg/m  , BMI Body mass index is 21.24 kg/m. Last weight:  Wt Readings from Last 3 Encounters:  10/10/23 156 lb 9.6 oz (71 kg)  09/29/23 154 lb (69.9 kg)  08/21/23 159 lb (72.1 kg)     Physical Exam Vitals reviewed.  Constitutional:      Appearance: Normal appearance. He is normal weight.  HENT:     Head: Normocephalic.     Nose: Nose normal.     Mouth/Throat:     Mouth: Mucous membranes are moist.  Eyes:     Pupils: Pupils are equal, round, and reactive to light.  Cardiovascular:     Rate and Rhythm: Normal rate and regular rhythm.     Pulses: Normal pulses.     Heart sounds: Normal heart sounds.  Pulmonary:     Effort: Pulmonary effort is normal.  Abdominal:     General: Abdomen is flat. Bowel sounds are normal.  Musculoskeletal:        General: Normal range of motion.     Cervical back: Normal range of motion.  Skin:    General: Skin is warm.  Neurological:     General: No focal deficit present.     Mental Status: He is alert.  Psychiatric:        Mood and Affect: Mood normal.  EKG:   Recent Labs: 07/24/2023: ALT 14 08/21/2023: BUN 21; Creatinine, Ser 1.30; Hemoglobin 11.3; Platelets 153; Potassium 3.3; Sodium 136    Lipid Panel    Component Value Date/Time   CHOL 283 (H) 11/21/2022 1030   TRIG 98 11/21/2022 1030   HDL  45 11/21/2022 1030   CHOLHDL 3.7 11/12/2020 0435   VLDL 9 11/12/2020 0435   LDLCALC 221 (H) 11/21/2022 1030   LDLCALC 105 (H) 12/07/2016 9041      Other studies Reviewed: Additional studies/ records that were reviewed today include:  Review of the above records demonstrates:       No data to display            ASSESSMENT AND PLAN:    ICD-10-CM   1. Chronic heart failure with preserved ejection fraction (HCC)  I50.32 dapagliflozin propanediol (FARXIGA) 10 MG TABS tablet   SOB, no chest pains. Add farxiga.    2. AF (paroxysmal atrial fibrillation) (HCC)  I48.0 dapagliflozin propanediol (FARXIGA) 10 MG TABS tablet    3. NSTEMI (non-ST elevated myocardial infarction) (HCC)  I21.4 dapagliflozin propanediol (FARXIGA) 10 MG TABS tablet    4. Mixed hyperlipidemia  E78.2 dapagliflozin propanediol (FARXIGA) 10 MG TABS tablet    5. S/P coronary artery stent placement  Z95.5 dapagliflozin propanediol (FARXIGA) 10 MG TABS tablet       Problem List Items Addressed This Visit       Cardiovascular and Mediastinum   AF (paroxysmal atrial fibrillation) (HCC)   Relevant Medications   dapagliflozin propanediol (FARXIGA) 10 MG TABS tablet   NSTEMI (non-ST elevated myocardial infarction) (HCC)   Relevant Medications   dapagliflozin propanediol (FARXIGA) 10 MG TABS tablet   (HFpEF) heart failure with preserved ejection fraction (HCC) - Primary   Relevant Medications   dapagliflozin propanediol (FARXIGA) 10 MG TABS tablet     Other   Hyperlipidemia   Relevant Medications   dapagliflozin propanediol (FARXIGA) 10 MG TABS tablet   S/P coronary artery stent placement   Relevant Medications   dapagliflozin propanediol (FARXIGA) 10 MG TABS tablet       Disposition:   Return in about 2 months (around 12/10/2023).    Total time spent: 30 minutes  Signed,  Denyse Bathe, MD  10/10/2023 10:55 AM    [facility]

## 2023-10-16 ENCOUNTER — Emergency Department
Admission: EM | Admit: 2023-10-16 | Discharge: 2023-10-16 | Disposition: A | Discharge status: Home / Self Care | Attending: Emergency Medicine | Admitting: Emergency Medicine

## 2023-10-16 ENCOUNTER — Other Ambulatory Visit: Payer: Self-pay

## 2023-10-16 ENCOUNTER — Emergency Department

## 2023-10-16 DIAGNOSIS — I4891 Unspecified atrial fibrillation: Secondary | ICD-10-CM | POA: Insufficient documentation

## 2023-10-16 DIAGNOSIS — Z7902 Long term (current) use of antithrombotics/antiplatelets: Secondary | ICD-10-CM | POA: Diagnosis not present

## 2023-10-16 DIAGNOSIS — J449 Chronic obstructive pulmonary disease, unspecified: Secondary | ICD-10-CM | POA: Insufficient documentation

## 2023-10-16 DIAGNOSIS — I700 Atherosclerosis of aorta: Secondary | ICD-10-CM | POA: Diagnosis not present

## 2023-10-16 DIAGNOSIS — X509XXA Other and unspecified overexertion or strenuous movements or postures, initial encounter: Secondary | ICD-10-CM | POA: Diagnosis not present

## 2023-10-16 DIAGNOSIS — M5136 Other intervertebral disc degeneration, lumbar region with discogenic back pain only: Secondary | ICD-10-CM | POA: Diagnosis not present

## 2023-10-16 DIAGNOSIS — I10 Essential (primary) hypertension: Secondary | ICD-10-CM | POA: Diagnosis not present

## 2023-10-16 DIAGNOSIS — M8580 Other specified disorders of bone density and structure, unspecified site: Secondary | ICD-10-CM | POA: Diagnosis not present

## 2023-10-16 DIAGNOSIS — M545 Low back pain, unspecified: Secondary | ICD-10-CM | POA: Insufficient documentation

## 2023-10-16 DIAGNOSIS — M5459 Other low back pain: Secondary | ICD-10-CM | POA: Diagnosis not present

## 2023-10-16 DIAGNOSIS — M810 Age-related osteoporosis without current pathological fracture: Secondary | ICD-10-CM | POA: Diagnosis not present

## 2023-10-16 LAB — URINALYSIS, ROUTINE W REFLEX MICROSCOPIC
Bilirubin Urine: NEGATIVE
Glucose, UA: NEGATIVE mg/dL
Hgb urine dipstick: NEGATIVE
Ketones, ur: NEGATIVE mg/dL
Nitrite: NEGATIVE
Protein, ur: NEGATIVE mg/dL
Specific Gravity, Urine: 1.009 (ref 1.005–1.030)
Squamous Epithelial / HPF: 0 /HPF (ref 0–5)
pH: 5 (ref 5.0–8.0)

## 2023-10-16 MED ORDER — LIDOCAINE 5 % EX PTCH
2.0000 | MEDICATED_PATCH | CUTANEOUS | Status: DC
  Administered 2023-10-16: 2 via TRANSDERMAL
  Filled 2023-10-16: qty 2

## 2023-10-16 MED ORDER — TRAMADOL HCL 50 MG PO TABS: 50.0000 mg | ORAL_TABLET | Freq: Three times a day (TID) | ORAL | 0 refills | Status: AC | PRN

## 2023-10-16 MED ORDER — LIDOCAINE 5 % EX PTCH
1.0000 | MEDICATED_PATCH | Freq: Two times a day (BID) | CUTANEOUS | 0 refills | Status: AC | PRN
Start: 2023-10-16 — End: 2023-10-26

## 2023-10-16 NOTE — ED Provider Notes (Signed)
 [facility] Emergency Department Provider Note     Event Date/Time   First MD Initiated Contact with Patient 10/16/23 1933     (approximate)   History   Back Pain   HPI  [name] Browning is a 83 y.o. male with a history of HTN, HLD, COPD, CVA, GERD, A-fib on Plavix , and DDD, presents to the ED for evaluation of mechanical low back pain.  Patient reports he was bending over and twisting yesterday when he took out the trash, when he stood up he had immediate pain to the midline low back.  Since that time has had ongoing reproducible pain and spasms with movement.  He denies any bladder or bowel incontinence, foot drop, saddle anesthesia.  Patient denies any outright fall related to the incident.  He presents to the ED for evaluation of his bilateral low back pain.  Physical Exam   Triage Vital Signs: ED Triage Vitals  Encounter Vitals Group     BP 10/16/23 1649 135/71     Girls Systolic BP Percentile --      Girls Diastolic BP Percentile --      Boys Systolic BP Percentile --      Boys Diastolic BP Percentile --      Pulse Rate 10/16/23 1649 69     Resp 10/16/23 1649 18     Temp 10/16/23 1649 98.1 F (36.7 C)     Temp Source 10/16/23 1649 Oral     SpO2 10/16/23 1649 96 %     Weight --      Height --      Head Circumference --      Peak Flow --      Pain Score 10/16/23 1650 8     Pain Loc --      Pain Education --      Exclude from Growth Chart --     Most recent vital signs: Vitals:   10/16/23 1649 10/16/23 2042  BP: 135/71 133/66  Pulse: 69 74  Resp: 18   Temp: 98.1 F (36.7 C)   SpO2: 96% 98%    General Awake, no distress.  NAD HEENT NCAT. PERRL. EOMI. No rhinorrhea. Mucous membranes are moist.  CV:  Good peripheral perfusion.  RRR no CCE distally. RESP:  Normal effort.  CTA ABD:  No distention.  Soft and nontender MSK:  Normal spinal alignment without midline tenderness, spasm, deformity, or step-off.  Patient tender to palpation  of the bilateral lumbosacral paraspinal musculature.  AROM of all extremities. NEURO: Cranial nerves II to XII grossly intact.  Normal LE DTRs bilaterally.  Normal toe dorsiflexion exam.  Negative supine straight leg raise bilaterally.   ED Results / Procedures / Treatments   Labs (all labs ordered are listed, but only abnormal results are displayed) Labs Reviewed  URINALYSIS, ROUTINE W REFLEX MICROSCOPIC - Abnormal; Notable for the following components:      Result Value   Color, Urine YELLOW (*)    APPearance CLEAR (*)    Leukocytes,Ua TRACE (*)    Bacteria, UA RARE (*)    All other components within normal limits     EKG   RADIOLOGY  I personally viewed and evaluated these images as part of my medical decision making, as well as reviewing the written report by the radiologist.  ED Provider Interpretation: No acute fracture or dislocation  DG Lumbar Spine 2-3 Views Result Date: 10/16/2023 CLINICAL DATA:  Low back pain radiating laterally on  both sides. EXAM: LUMBAR SPINE - 2-3 VIEW COMPARISON:  Reformats from abdominopelvic CT 06/25/2023 FINDINGS: The bones are subjectively under mineralized. There are 5 non-rib-bearing lumbar vertebra. Slight straightening of normal lordosis. No listhesis. No evidence of fracture or compression deformity. Chronic undulation of the inferior endplate of L4. Mild diffuse degenerative disc disease and facet hypertrophy. No sacroiliac diastasis. Aortic atherosclerosis. IMPRESSION: 1. Mild diffuse degenerative disc disease and facet hypertrophy. 2. Slight straightening of normal lordosis. 3. No acute fracture.  Osteopenia/osteoporosis. Electronically Signed   By: Andrea Gasman M.D.   On: 10/16/2023 17:58    PROCEDURES:  Critical Care performed: No  Procedures   MEDICATIONS ORDERED IN ED: Medications  lidocaine  (LIDODERM ) 5 % 2 patch (2 patches Transdermal Patch Applied 10/16/23 1951)     IMPRESSION / MDM / ASSESSMENT AND PLAN / ED COURSE   I reviewed the triage vital signs and the nursing notes.                              Differential diagnosis includes, but is not limited to, lumbar strain, lumbar radiculopathy, DDD, DJD, myalgias  Patient's presentation is most consistent with acute complicated illness / injury requiring diagnostic workup.  Patient's diagnosis is consistent with acute bilateral low back pain likely secondary to underlying DDD and Reiter's.  Patient presents reproducible back pain after mechanical injury yesterday.  Denies any outright fall, bladder or bowel incontinence, foot up, or saddle anesthesia.  Exam is reassuring with no acute neuromuscular deficits.  X-ray images interpreted by me, showed no acute fracture or dislocation.  Patient will be discharged home with prescriptions for Lidoderm  patches and Ultram . Patient is to follow up with his primary provider as needed or otherwise directed. Patient is given ED precautions to return to the ED for any worsening or new symptoms.   FINAL CLINICAL IMPRESSION(S) / ED DIAGNOSES   Final diagnoses:  Acute bilateral low back pain without sciatica     Rx / DC Orders   ED Discharge Orders          Ordered    traMADol  (ULTRAM ) 50 MG tablet  3 times daily PRN        10/16/23 2022    lidocaine  (LIDODERM ) 5 %  Every 12 hours PRN        10/16/23 2022             Note:  This document was prepared using Dragon voice recognition software and may include unintentional dictation errors.    Loyd Candida LULLA Aldona, PA-C 10/16/23 2049    Levander Slate, MD 10/16/23 2251

## 2023-10-16 NOTE — ED Triage Notes (Signed)
 Pt to ED via POV from home. Pt reports was bending over picking something up and when he stood up straight and started to have lower back pain. Pain 8/10.

## 2023-10-16 NOTE — Discharge Instructions (Addendum)
 Your exam and x-ray are normal and reassuring.  No signs of any acute fracture or dislocation.  You do have evidence of degenerative disc disease and arthritis of your lower back.  This is probably the cause of your underlying mechanical back pain.  You should take the prescription pain medicine as needed for back pain.  Note that it can cause some drowsiness, so do not take it if you need to run errands or drive.  You should continue with OTC Tylenol  as needed.  Use lidocaine  patches as directed.  Follow-up with your primary provider for ongoing evaluation.  Return to the ED if needed.

## 2023-10-19 ENCOUNTER — Emergency Department

## 2023-10-19 ENCOUNTER — Other Ambulatory Visit: Payer: Self-pay

## 2023-10-19 DIAGNOSIS — Z7982 Long term (current) use of aspirin: Secondary | ICD-10-CM | POA: Insufficient documentation

## 2023-10-19 DIAGNOSIS — N200 Calculus of kidney: Secondary | ICD-10-CM | POA: Diagnosis not present

## 2023-10-19 DIAGNOSIS — K5730 Diverticulosis of large intestine without perforation or abscess without bleeding: Secondary | ICD-10-CM | POA: Diagnosis not present

## 2023-10-19 DIAGNOSIS — Z7902 Long term (current) use of antithrombotics/antiplatelets: Secondary | ICD-10-CM | POA: Insufficient documentation

## 2023-10-19 DIAGNOSIS — I5030 Unspecified diastolic (congestive) heart failure: Secondary | ICD-10-CM | POA: Insufficient documentation

## 2023-10-19 DIAGNOSIS — Z7901 Long term (current) use of anticoagulants: Secondary | ICD-10-CM | POA: Diagnosis not present

## 2023-10-19 DIAGNOSIS — M5442 Lumbago with sciatica, left side: Secondary | ICD-10-CM | POA: Insufficient documentation

## 2023-10-19 DIAGNOSIS — I4891 Unspecified atrial fibrillation: Secondary | ICD-10-CM | POA: Diagnosis not present

## 2023-10-19 DIAGNOSIS — I110 Hypertensive heart disease with heart failure: Secondary | ICD-10-CM | POA: Diagnosis not present

## 2023-10-19 DIAGNOSIS — J449 Chronic obstructive pulmonary disease, unspecified: Secondary | ICD-10-CM | POA: Diagnosis not present

## 2023-10-19 DIAGNOSIS — Z79899 Other long term (current) drug therapy: Secondary | ICD-10-CM | POA: Diagnosis not present

## 2023-10-19 DIAGNOSIS — I2510 Atherosclerotic heart disease of native coronary artery without angina pectoris: Secondary | ICD-10-CM | POA: Diagnosis not present

## 2023-10-19 DIAGNOSIS — Z85828 Personal history of other malignant neoplasm of skin: Secondary | ICD-10-CM | POA: Diagnosis not present

## 2023-10-19 DIAGNOSIS — M545 Low back pain, unspecified: Secondary | ICD-10-CM | POA: Diagnosis present

## 2023-10-19 DIAGNOSIS — N390 Urinary tract infection, site not specified: Secondary | ICD-10-CM | POA: Insufficient documentation

## 2023-10-19 DIAGNOSIS — M5441 Lumbago with sciatica, right side: Secondary | ICD-10-CM | POA: Insufficient documentation

## 2023-10-19 LAB — URINALYSIS, ROUTINE W REFLEX MICROSCOPIC
Bilirubin Urine: NEGATIVE
Glucose, UA: NEGATIVE mg/dL
Hgb urine dipstick: NEGATIVE
Ketones, ur: NEGATIVE mg/dL
Nitrite: NEGATIVE
Protein, ur: NEGATIVE mg/dL
Specific Gravity, Urine: 1.015 (ref 1.005–1.030)
pH: 5 (ref 5.0–8.0)

## 2023-10-19 LAB — BASIC METABOLIC PANEL WITH GFR
Anion gap: 12 (ref 5–15)
BUN: 18 mg/dL (ref 8–23)
CO2: 28 mmol/L (ref 22–32)
Calcium: 9.4 mg/dL (ref 8.9–10.3)
Chloride: 97 mmol/L — ABNORMAL LOW (ref 98–111)
Creatinine, Ser: 1.32 mg/dL — ABNORMAL HIGH (ref 0.61–1.24)
GFR, Estimated: 54 mL/min — ABNORMAL LOW (ref >60)
Glucose, Bld: 133 mg/dL — ABNORMAL HIGH (ref 70–99)
Potassium: 3.6 mmol/L (ref 3.5–5.1)
Sodium: 137 mmol/L (ref 135–145)

## 2023-10-19 LAB — CBC
HCT: 40.7 % (ref 39.0–52.0)
Hemoglobin: 13.4 g/dL (ref 13.0–17.0)
MCH: 30.5 pg (ref 26.0–34.0)
MCHC: 32.9 g/dL (ref 30.0–36.0)
MCV: 92.5 fL (ref 80.0–100.0)
Platelets: 224 K/uL (ref 150–400)
RBC: 4.4 MIL/uL (ref 4.22–5.81)
RDW: 13.2 % (ref 11.5–15.5)
WBC: 7.5 K/uL (ref 4.0–10.5)
nRBC: 0 % (ref 0.0–0.2)

## 2023-10-19 NOTE — ED Triage Notes (Addendum)
 Pt arrives POV via WC c/o lower back pain that radiates to privates. Pt seen here 10/06 and diagnosed w sciatica, pt reports pain is worse. Decreased urination, small amount of urine when he goes. Pt has hx of kidney problems per son. Denies cp or sob. Pt reports intermittent sharp, stabbing pains in lower back.

## 2023-10-20 ENCOUNTER — Emergency Department
Admission: EM | Admit: 2023-10-20 | Discharge: 2023-10-20 | Disposition: A | Discharge status: Home / Self Care | Attending: Emergency Medicine | Admitting: Emergency Medicine

## 2023-10-20 DIAGNOSIS — M5441 Lumbago with sciatica, right side: Secondary | ICD-10-CM | POA: Diagnosis not present

## 2023-10-20 DIAGNOSIS — M5440 Lumbago with sciatica, unspecified side: Secondary | ICD-10-CM

## 2023-10-20 DIAGNOSIS — N390 Urinary tract infection, site not specified: Secondary | ICD-10-CM

## 2023-10-20 LAB — HEPATIC FUNCTION PANEL
ALT: 16 U/L (ref 0–44)
AST: 32 U/L (ref 15–41)
Albumin: 3.8 g/dL (ref 3.5–5.0)
Alkaline Phosphatase: 167 U/L — ABNORMAL HIGH (ref 38–126)
Bilirubin, Direct: 0.3 mg/dL — ABNORMAL HIGH (ref 0.0–0.2)
Indirect Bilirubin: 1.1 mg/dL — ABNORMAL HIGH (ref 0.3–0.9)
Total Bilirubin: 1.4 mg/dL — ABNORMAL HIGH (ref 0.0–1.2)
Total Protein: 7.9 g/dL (ref 6.5–8.1)

## 2023-10-20 LAB — LIPASE, BLOOD: Lipase: 26 U/L (ref 11–51)

## 2023-10-20 MED ORDER — LIDOCAINE HCL (PF) 1 % IJ SOLN
5.0000 mL | Freq: Once | INTRAMUSCULAR | Status: AC
  Administered 2023-10-20: 5 mL via INTRADERMAL
  Filled 2023-10-20: qty 5

## 2023-10-20 MED ORDER — CEFTRIAXONE SODIUM 1 G IJ SOLR
1.0000 g | Freq: Once | INTRAMUSCULAR | Status: AC
  Administered 2023-10-20: 1 g via INTRAMUSCULAR
  Filled 2023-10-20: qty 10

## 2023-10-20 MED ORDER — CEPHALEXIN 500 MG PO CAPS: 500.0000 mg | ORAL_CAPSULE | Freq: Two times a day (BID) | ORAL | 0 refills | Status: AC

## 2023-10-20 MED ORDER — HYDROCODONE-ACETAMINOPHEN 5-325 MG PO TABS
1.0000 | ORAL_TABLET | Freq: Once | ORAL | Status: AC
Start: 2023-10-20 — End: 2023-10-20
  Administered 2023-10-20: 1 via ORAL
  Filled 2023-10-20: qty 1

## 2023-10-20 MED ORDER — DOCUSATE SODIUM 100 MG PO CAPS: 100.0000 mg | ORAL_CAPSULE | Freq: Two times a day (BID) | ORAL | 0 refills | Status: AC

## 2023-10-20 MED ORDER — ONDANSETRON 4 MG PO TBDP: 4.0000 mg | ORAL_TABLET | Freq: Four times a day (QID) | ORAL | 0 refills | Status: AC | PRN

## 2023-10-20 MED ORDER — ONDANSETRON 4 MG PO TBDP
4.0000 mg | ORAL_TABLET | Freq: Once | ORAL | Status: AC
  Administered 2023-10-20: 4 mg via ORAL
  Filled 2023-10-20: qty 1

## 2023-10-20 MED ORDER — HYDROCODONE-ACETAMINOPHEN 5-325 MG PO TABS: 1.0000 | ORAL_TABLET | Freq: Four times a day (QID) | ORAL | 0 refills | Status: AC | PRN

## 2023-10-20 NOTE — Discharge Instructions (Addendum)
 Please discontinue your tramadol  and begin taking hydrocodone  instead for pain control.    You are being provided a prescription for opiates (also known as narcotics) for pain control.  Opiates can be addictive and should only be used when absolutely necessary for pain control when other alternatives do not work.  We recommend you only use them for the recommended amount of time and only as prescribed.  Please do not take with other sedative medications or alcohol.  Please do not drive, operate machinery, make important decisions while taking opiates.  Please note that these medications can be addictive and have high abuse potential.  Patients can become addicted to narcotics after only taking them for a few days.  Please keep these medications locked away from children, teenagers or any family members with history of substance abuse.  Narcotic pain medicine may also make you constipated.  You may use over-the-counter medications such as MiraLAX , Colace to prevent constipation.  If you become constipated, you may use over-the-counter enemas as needed.  Itching and nausea are also common side effects of narcotic pain medication.  If you develop uncontrolled vomiting or a rash, please stop these medications and seek medical care.

## 2023-10-20 NOTE — ED Provider Notes (Signed)
 [facility] Provider Note    Event Date/Time   First MD Initiated Contact with Patient 10/20/23 0031     (approximate)   History   Flank Pain   HPI  [name] Browning is a 83 y.o. male with history of atrial fibrillation on Eliquis , CVA on Plavix , COPD, CHF, hypertension, hyperlipidemia, diastolic heart failure who presents to the emergency department with complaints of lower back pain ongoing for several days worse with movement.  Denies any fall, injuries.  No numbness, tingling, focal weakness, bowel or bladder incontinence.  He is able to ambulate.  Was seen in the emergency department here several days ago for the same and given prescription for tramadol  which he states is not helping.  He now complains of urinary discomfort and suprapubic pressure.  Has had nausea without vomiting.  No diarrhea.   History provided by patient, son.    Past Medical History:  Diagnosis Date   Anginal pain    Anxiety    Aortic atherosclerosis    Atrial fibrillation (HCC) 01/2019   Bilateral carpal tunnel syndrome    CAD (coronary artery disease)    a.) LHC 2004 --> normal coronaries. b.) normal stress test in 2007 and 2011; c.) Lexiscan 05/20/2014 --> LVEF 55-65%; no significant stress induced ischemia/arrythmia. d.) CT chest 03/25/2019 --> coronaries carcified.   Carotid atherosclerosis, bilateral    Carpal tunnel syndrome, bilateral    CHF (congestive heart failure) (HCC)    Chronic anticoagulation    a.) ASA + apixaban    COPD (chronic obstructive pulmonary disease) (HCC)    CVA (cerebral vascular accident) (HCC)    Degenerative disc disease, cervical    Diastolic dysfunction    a.) TTE 05/29/2014 --> LVEF 60-65%; G1DD.   DVT (deep venous thrombosis) (HCC)    GERD (gastroesophageal reflux disease)    History of 2019 novel coronavirus disease (COVID-19) 02/08/2019   HLD (hyperlipidemia)    Hypertension    Kidney stones    Osteoarthritis of right shoulder     Pneumonia    Respiratory failure, acute (HCC) 09/20/2020   a.) severe respiratory distress 1 hour after urological surgery. CXR (+) for acute pulmonary edema. Transferred to ICU and placed on NIPPV. Questionable aspiration PNA. (+) A.fib with RVR. Improved with ABX, diuresis, and amiodarone .   Skin cancer of face    a.) RIGHT ear and RIGHT forehead; excised.   Syncope    TIA (transient ischemic attack) 2016   Valvular regurgitation    a.) TTE 05/26/2014 --> LVEF 60-65%; trivial MR, mild TR; no AR or PR. b.) TTE 04/20/2015 --> LVEF 55-60%; trivial MR and PR; no AR or TR.    Past Surgical History:  Procedure Laterality Date   CARDIAC CATHETERIZATION  2004   CARPAL TUNNEL RELEASE Right 06/11/2013   CENTRAL LINE INSERTION  11/14/2020   Procedure: CENTRAL LINE INSERTION;  Surgeon: Anner Alm ORN, MD;  Location: [facility] INVASIVE CV LAB;  Service: Cardiovascular;;   CORONARY/GRAFT ACUTE MI REVASCULARIZATION N/A 11/14/2020   Procedure: Coronary/Graft Acute MI Revascularization;  Surgeon: Anner Alm ORN, MD;  Location: [facility] INVASIVE CV LAB;  Service: Cardiovascular;  Laterality: N/A;   CYSTOSCOPY W/ RETROGRADES  10/16/2020   Procedure: CYSTOSCOPY WITH RETROGRADE PYELOGRAM;  Surgeon: Francisca Redell BROCKS, MD;  Location: [facility] ORS;  Service: Urology;;   CYSTOSCOPY W/ URETERAL STENT PLACEMENT Left 09/20/2020   Procedure: CYSTOSCOPY WITH RETROGRADE PYELOGRAM/URETERAL STENT PLACEMENT;  Surgeon: Selma Donnice SAUNDERS, MD;  Location: [facility] ORS;  Service: Urology;  Laterality: Left;   CYSTOSCOPY/URETEROSCOPY/HOLMIUM LASER/STENT PLACEMENT Left 10/16/2020   Procedure: CYSTOSCOPY/URETEROSCOPY/HOLMIUM LASER/STENT PLACEMENT;  Surgeon: Francisca Redell BROCKS, MD;  Location: [facility] ORS;  Service: Urology;  Laterality: Left;   ESOPHAGOGASTRODUODENOSCOPY N/A 11/06/2020   Procedure: ESOPHAGOGASTRODUODENOSCOPY (EGD);  Surgeon: Jinny Carmine, MD;  Location: [facility] ENDOSCOPY;  Service: Endoscopy;  Laterality: N/A;   LEFT HEART CATH AND CORONARY  ANGIOGRAPHY N/A 11/14/2020   Procedure: LEFT HEART CATH AND CORONARY ANGIOGRAPHY;  Surgeon: Anner Alm ORN, MD;  Location: [facility] INVASIVE CV LAB;  Service: Cardiovascular;  Laterality: N/A;   LEFT HEART CATH AND CORONARY ANGIOGRAPHY Left 01/28/2022   Procedure: LEFT HEART CATH AND CORONARY ANGIOGRAPHY;  Surgeon: [name], MD;  Location: [facility] INVASIVE CV LAB;  Service: Cardiovascular;  Laterality: Left;   LEFT HEART CATH AND CORONARY ANGIOGRAPHY Left 08/04/2023   Procedure: LEFT HEART CATH AND CORONARY ANGIOGRAPHY with possible coronary intervention;  Surgeon: [name], MD;  Location: [facility] INVASIVE CV LAB;  Service: Cardiovascular;  Laterality: Left;   SHOULDER ARTHROSCOPY WITH OPEN ROTATOR CUFF REPAIR Right 08/24/2017   Procedure: SHOULDER ARTHROSCOPY WITH OPEN ROTATOR CUFF REPAIR;  Surgeon: Edie Norleen PARAS, MD;  Location: [facility] ORS;  Service: Orthopedics;  Laterality: Right;   SKIN CANCER EXCISION  12/01/2016   right ear    SKIN CANCER EXCISION     remove from the right side of the face    THROMBECTOMY Right 2004   leg   THYROIDECTOMY  1950   Not sure if total or partial thyroidectomy.    MEDICATIONS:  Prior to Admission medications   Medication Sig Start Date End Date Taking? Authorizing Provider  cephALEXin  (KEFLEX ) 500 MG capsule Take 1 capsule (500 mg total) by mouth 2 (two) times daily. 10/20/23  Yes Cabella Kimm, Josette SAILOR, DO  docusate sodium  (COLACE) 100 MG capsule Take 1 capsule (100 mg total) by mouth 2 (two) times daily. 10/20/23 11/19/23 Yes Kelseigh Diver, Josette SAILOR, DO  HYDROcodone -acetaminophen  (NORCO/VICODIN) 5-325 MG tablet Take 1 tablet by mouth every 6 (six) hours as needed. 10/20/23  Yes Mahreen Schewe N, DO  ondansetron  (ZOFRAN -ODT) 4 MG disintegrating tablet Take 1 tablet (4 mg total) by mouth every 6 (six) hours as needed for nausea or vomiting. 10/20/23  Yes Floris Neuhaus, Josette SAILOR, DO  arformoterol  (BROVANA ) 15 MCG/2ML NEBU Take 2 mLs (15 mcg total) by nebulization 2 (two) times daily.  06/29/23   Kasa, Kurian, MD  aspirin  EC 81 MG tablet Take 81 mg by mouth daily after supper.    [provider]  atorvastatin  (LIPITOR ) 80 MG tablet Take 1 tablet (80 mg total) by mouth daily. 04/20/23   Fernand Fredy RAMAN, MD  budesonide  (PULMICORT ) 0.5 MG/2ML nebulizer solution Take 2 mLs (0.5 mg total) by nebulization 2 (two) times daily. 06/29/23 06/28/24  Kasa, Kurian, MD  cefdinir  (OMNICEF ) 300 MG capsule Take 300 mg by mouth 2 (two) times daily. 09/12/23   [provider]  clopidogrel  (PLAVIX ) 75 MG tablet TAKE 1 TABLET BY MOUTH EVERY DAY 07/04/23   Donette Ellouise LABOR, FNP  dapagliflozin propanediol (FARXIGA) 10 MG TABS tablet Take 1 tablet (10 mg total) by mouth daily before breakfast. 10/10/23   [name], MD  DULoxetine  (CYMBALTA ) 30 MG capsule Take 1 capsule (30 mg total) by mouth daily. 05/19/23   Maree Hue, MD  Ensifentrine  (OHTUVAYRE ) 3 MG/2.5ML SUSP Inhale 2.5 mLs into the lungs 2 (two) times daily. 11/30/22   Kasa, Kurian, MD  ferrous gluconate  (FERGON) 240 (27 FE) MG tablet Take  1 tablet (240 mg total) by mouth daily. 05/19/23   Maree Hue, MD  finasteride  (PROSCAR ) 5 MG tablet TAKE 1 TABLET (5 MG TOTAL) BY MOUTH DAILY. 08/16/23   Helon Kirsch A, PA-C  isosorbide  mononitrate (IMDUR ) 30 MG 24 hr tablet Take 1 tablet (30 mg total) by mouth daily. 06/13/23   [name], MD  ketorolac  (ACULAR ) 0.5 % ophthalmic solution Place 1 drop into the left eye 4 (four) times daily. 08/31/23   [provider]  lidocaine  (LIDODERM ) 5 % Place 1 patch onto the skin every 12 (twelve) hours as needed for up to 10 days. Remove & Discard patch after 12 hours of wear each day. 10/16/23 10/26/23  Menshew, Candida LULLA Kings, PA-C  losartan  (COZAAR ) 50 MG tablet TAKE 1 TABLET BY MOUTH EVERY DAY 09/01/23   [name], MD  metoprolol  succinate (TOPROL -XL) 25 MG 24 hr tablet TAKE 1 TABLET (25 MG TOTAL) BY MOUTH DAILY. 09/01/23 08/31/24  [name], MD  moxifloxacin (VIGAMOX) 0.5 %  ophthalmic solution Place 1 drop into the left eye 4 (four) times daily. 08/31/23   [provider]  Nebulizers (VIOS LC PLUS) MISC  12/16/22   [provider]  nitroGLYCERIN  (NITROSTAT ) 0.4 MG SL tablet Place 0.4 mg under the tongue every 5 (five) minutes as needed for chest pain.    [provider]  pantoprazole  (PROTONIX ) 40 MG tablet Take 1 tablet (40 mg total) by mouth daily. 08/07/23 08/06/24  [name], MD  potassium chloride  SA (KLOR-CON  M) 20 MEQ tablet TAKE 1 TABLET BY MOUTH EVERY DAY 08/14/23   Lee, [place], NP  ranolazine  (RANEXA ) 1000 MG SR tablet TAKE 1 TABLET BY MOUTH TWICE A DAY 06/19/23   [name], MD  roflumilast  (DALIRESP ) 500 MCG TABS tablet Take 1 tablet (500 mcg total) by mouth daily. 06/29/23   Kasa, Kurian, MD  tamsulosin  (FLOMAX ) 0.4 MG CAPS capsule TAKE 1 CAPSULE BY MOUTH EVERY DAY 09/01/23   Helon Kirsch A, PA-C  torsemide  (DEMADEX ) 20 MG tablet Take 3 tablets (60 mg total) by mouth daily. 09/01/23 11/30/23  Donette Ellouise LABOR, FNP  traMADol  (ULTRAM ) 50 MG tablet Take 1 tablet (50 mg total) by mouth 3 (three) times daily as needed for severe pain (pain score 7-10). 10/16/23   Menshew, Candida LULLA Kings, PA-C    Physical Exam   Triage Vital Signs: ED Triage Vitals 10/19/23 2125  Encounter Vitals Group     BP 137/62     Girls Systolic BP Percentile      Girls Diastolic BP Percentile      Boys Systolic BP Percentile      Boys Diastolic BP Percentile      Pulse Rate 75     Resp 18     Temp 97.8 F (36.6 C)     Temp Source Oral     SpO2 96 %     Weight      Height      Head Circumference      Peak Flow      Pain Score 9     Pain Loc      Pain Education      Exclude from Growth Chart     Most recent vital signs: Vitals:   10/19/23 2125  BP: 137/62  Pulse: 75  Resp: 18  Temp: 97.8 F (36.6 C)  SpO2: 96%    CONSTITUTIONAL: Alert, responds appropriately to questions.  Elderly, chronically ill-appearing HEAD: Normocephalic,  atraumatic EYES: Conjunctivae clear, pupils appear equal, sclera nonicteric ENT: normal nose; moist mucous membranes NECK: Supple, normal ROM CARD: RRR; S1 and S2 appreciated RESP: Normal chest excursion without splinting or tachypnea; breath sounds clear and equal bilaterally; no wheezes, no rhonchi, no rales, no hypoxia or respiratory distress, speaking full sentences ABD/GI: Non-distended; soft, mild tenderness in the suprapubic region, no guarding or rebound, no tenderness at McBurney's point GU:  Normal external genitalia, circumcised male, normal penile shaft, no blood or discharge at the urethral meatus, no testicular masses or tenderness on exam, no scrotal masses or swelling, no hernias appreciated, 2+ femoral pulses bilaterally; no perineal erythema, warmth, subcutaneous air or crepitus; no high riding testicle, normal bilateral cremasteric reflex.  Chaperone present for exam. BACK: The back appears normal, no midline spinal tenderness or step-off or deformity EXT: Normal ROM in all joints; no deformity noted, no edema SKIN: Normal color for age and race; warm; no rash on exposed skin NEURO: Moves all extremities equally, normal speech, no saddle anesthesia, no hyperreflexia, no clonus PSYCH: The patient's mood and manner are appropriate.   ED Results / Procedures / Treatments   LABS: (all labs ordered are listed, but only abnormal results are displayed) Labs Reviewed  URINALYSIS, ROUTINE W REFLEX MICROSCOPIC - Abnormal; Notable for the following components:      Result Value   Color, Urine YELLOW (*)    APPearance HAZY (*)    Leukocytes,Ua MODERATE (*)    Bacteria, UA RARE (*)    All other components within normal limits  BASIC METABOLIC PANEL WITH GFR - Abnormal; Notable for the following components:   Chloride 97 (*)    Glucose, Bld 133 (*)    Creatinine, Ser 1.32 (*)    GFR, Estimated 54 (*)    All other components within normal limits  HEPATIC FUNCTION PANEL -  Abnormal; Notable for the following components:   Alkaline Phosphatase 167 (*)    Total Bilirubin 1.4 (*)    Bilirubin, Direct 0.3 (*)    Indirect Bilirubin 1.1 (*)    All other components within normal limits  URINE CULTURE  CBC  LIPASE, BLOOD     EKG:  EKG Interpretation Date/Time:    Ventricular Rate:    PR Interval:    QRS Duration:    QT Interval:    QTC Calculation:   R Axis:      Text Interpretation:           RADIOLOGY: My personal review and interpretation of imaging: CT scan shows no pyelonephritis, ureterolithiasis, hydronephrosis.  I have personally reviewed all radiology reports.   CT Renal Stone Study Result Date: 10/19/2023 CLINICAL DATA:  Low back pain radiating into the scrotum, initial encounter EXAM: CT ABDOMEN AND PELVIS WITHOUT CONTRAST TECHNIQUE: Multidetector CT imaging of the abdomen and pelvis was performed following the standard protocol without IV contrast. RADIATION DOSE REDUCTION: This exam was performed according to the departmental dose-optimization program which includes automated exposure control, adjustment of the mA and/or kV according to patient size and/or use of iterative reconstruction technique. COMPARISON:  06/15/2023 FINDINGS: Lower chest: Scarring is noted in the right middle lobe. Diffuse emphysematous change with interstitial thickening is seen Hepatobiliary: No focal liver abnormality is seen. No gallstones, gallbladder wall thickening, or biliary dilatation. Pancreas: Unremarkable. No pancreatic ductal dilatation or surrounding inflammatory changes. Spleen: Normal in size without focal abnormality. Adrenals/Urinary Tract: Adrenal glands are within normal limits. Kidneys are well visualized bilaterally. Increase in nonobstructing stones in  the left lower pole is noted. The largest of these measures up to 12 mm. No obstructive changes are seen. The bladder is decompressed. Stomach/Bowel: Diverticular change of the colon is noted without  evidence of diverticulitis. No obstructive changes are seen. The appendix is within normal limits. Small bowel and stomach are within normal limits. Vascular/Lymphatic: Aortic atherosclerosis. No enlarged abdominal or pelvic lymph nodes. Reproductive: Prostate is prominent. Other: No abdominal wall hernia or abnormality. No abdominopelvic ascites. Musculoskeletal: Healed right inferior pubic ramus fracture is noted. No acute bony abnormality is seen. IMPRESSION: Nonobstructing left lower pole renal calculi increased when compared with the prior exam. Diverticulosis without diverticulitis. Electronically Signed   By: Oneil Devonshire M.D.   On: 10/19/2023 23:39     PROCEDURES:  Critical Care performed: No     Procedures    IMPRESSION / MDM / ASSESSMENT AND PLAN / ED COURSE  I reviewed the triage vital signs and the nursing notes.    Patient here with complaints of lower back pain with urinary symptoms.  The patient is on the cardiac monitor to evaluate for evidence of arrhythmia and/or significant heart rate changes.   DIFFERENTIAL DIAGNOSIS (includes but not limited to):   UTI, pyelonephritis, kidney stone, lumbar radiculopathy, muscle strain, muscle spasm, arthritis, doubt discitis or osteomyelitis, transverse myelitis, epidural abscess or hematoma, cauda equina   Patient's presentation is most consistent with acute presentation with potential threat to life or bodily function.   PLAN: Will obtain labs, urine, CT of the abdomen pelvis.  Will give pain medication here.   MEDICATIONS GIVEN IN ED: Medications  HYDROcodone -acetaminophen  (NORCO/VICODIN) 5-325 MG per tablet 1 tablet (1 tablet Oral Given 10/20/23 0119)  ondansetron  (ZOFRAN -ODT) disintegrating tablet 4 mg (4 mg Oral Given 10/20/23 0119)  cefTRIAXone  (ROCEPHIN ) injection 1 g (1 g Intramuscular Given 10/20/23 0122)  lidocaine  (PF) (XYLOCAINE ) 1 % injection 5 mL (5 mLs Intradermal Given 10/20/23 0122)     ED COURSE: CT  scan reviewed and interpreted by myself and the radiologist and shows no ureterolithiasis, hydronephrosis, pyelonephritis or other acute abnormality.  Spine also shows no fracture.  No red flag symptoms to suggest neurosurgical emergency.  No indication for MRI.  Urine does appear infected.  Will send culture.  Will start him on antibiotics.  Given dose of Rocephin  here in the ED.  GU exam is unremarkable.  Doubt testicular torsion, epididymitis, orchitis as his testicular exam is benign.  Given he reports tramadol  is not helping his pain, will provide with a new prescription for hydrocodone /acetaminophen  here.  Recommended close follow-up with his outpatient doctor.   Labs show no leukocytosis, stable chronic kidney disease.  Normal electrolytes.   CONSULTS: We discussed the possibility of admission but given workup has been reassuring, patient has no focal neurologic deficits and no signs of sepsis, I feel it is reasonable for him to be discharged for outpatient management.  Patient and son comfortable with this plan.  At this time, I do not feel there is any life-threatening condition present. I reviewed all nursing notes, vitals, pertinent previous records.  All lab and urine results, EKGs, imaging ordered have been independently reviewed and interpreted by myself.  I reviewed all available radiology reports from any imaging ordered this visit.  Based on my assessment, I feel the patient is safe to be discharged home without further emergent workup and can continue workup as an outpatient as needed. Discussed all findings, treatment plan as well as usual and customary return precautions.  They verbalize understanding and are comfortable with this plan.  Outpatient follow-up has been provided as needed.  All questions have been answered.  OUTSIDE RECORDS REVIEWED: Reviewed recent PCP notes.       FINAL CLINICAL IMPRESSION(S) / ED DIAGNOSES   Final diagnoses:  Acute UTI  Acute bilateral  low back pain with sciatica, sciatica laterality unspecified     Rx / DC Orders   ED Discharge Orders          Ordered    HYDROcodone -acetaminophen  (NORCO/VICODIN) 5-325 MG tablet  Every 6 hours PRN        10/20/23 0109    ondansetron  (ZOFRAN -ODT) 4 MG disintegrating tablet  Every 6 hours PRN        10/20/23 0109    cephALEXin  (KEFLEX ) 500 MG capsule  2 times daily        10/20/23 0109    docusate sodium  (COLACE) 100 MG capsule  2 times daily        10/20/23 0109             Note:  This document was prepared using Dragon voice recognition software and may include unintentional dictation errors.   Mazi Schuff, Josette SAILOR, DO 10/20/23 [phone]

## 2023-10-21 LAB — URINE CULTURE: Culture: NO GROWTH

## 2023-10-31 ENCOUNTER — Ambulatory Visit: Admitting: Physician Assistant

## 2023-11-11 ENCOUNTER — Other Ambulatory Visit: Payer: Self-pay | Admitting: Cardiovascular Disease

## 2023-11-11 DIAGNOSIS — R0789 Other chest pain: Secondary | ICD-10-CM

## 2023-11-11 DIAGNOSIS — I5033 Acute on chronic diastolic (congestive) heart failure: Secondary | ICD-10-CM

## 2023-11-11 DIAGNOSIS — Z955 Presence of coronary angioplasty implant and graft: Secondary | ICD-10-CM

## 2023-11-11 DIAGNOSIS — I10 Essential (primary) hypertension: Secondary | ICD-10-CM

## 2023-11-11 DIAGNOSIS — I4891 Unspecified atrial fibrillation: Secondary | ICD-10-CM

## 2023-11-14 ENCOUNTER — Encounter: Payer: Self-pay | Admitting: Physician Assistant

## 2023-11-14 ENCOUNTER — Ambulatory Visit: Admitting: Cardiovascular Disease

## 2023-11-14 ENCOUNTER — Ambulatory Visit: Admitting: Physician Assistant

## 2023-11-14 DIAGNOSIS — N390 Urinary tract infection, site not specified: Secondary | ICD-10-CM

## 2023-11-27 ENCOUNTER — Other Ambulatory Visit: Payer: Self-pay | Admitting: Urology

## 2023-11-28 ENCOUNTER — Ambulatory Visit (INDEPENDENT_AMBULATORY_CARE_PROVIDER_SITE_OTHER): Admitting: Physician Assistant

## 2023-11-28 ENCOUNTER — Telehealth: Payer: Self-pay

## 2023-11-28 VITALS — BP 148/71 | HR 84 | Ht 74.0 in | Wt 142.8 lb

## 2023-11-28 DIAGNOSIS — N390 Urinary tract infection, site not specified: Secondary | ICD-10-CM | POA: Diagnosis not present

## 2023-11-28 DIAGNOSIS — R1024 Suprapubic pain: Secondary | ICD-10-CM

## 2023-11-28 DIAGNOSIS — R8271 Bacteriuria: Secondary | ICD-10-CM

## 2023-11-28 LAB — MICROSCOPIC EXAMINATION: WBC, UA: >30 /HPF — AB (ref 0–5)

## 2023-11-28 LAB — URINALYSIS, COMPLETE
Bilirubin, UA: NEGATIVE
Glucose, UA: NEGATIVE
Ketones, UA: NEGATIVE
Nitrite, UA: POSITIVE — AB
Specific Gravity, UA: 1.015 (ref 1.005–1.030)
Urobilinogen, Ur: 0.2 mg/dL (ref 0.2–1.0)
pH, UA: 6 (ref 5.0–7.5)

## 2023-11-28 LAB — BLADDER SCAN AMB NON-IMAGING

## 2023-11-28 NOTE — Telephone Encounter (Signed)
 Reached out to [facility] to check on previous referral for Urodynamics. Ellouise stated the referral is still active however, the patient would become out of network beginning 01/11/2024 and their office has no availability until after 01/2024.

## 2023-11-28 NOTE — Progress Notes (Signed)
 11/28/2023 1:10 PM   [name] 1940/02/25 [id number]  CC: Chief Complaint  Patient presents with   Follow-up   Recurrent UTI   HPI: [name] Browning is a 83 y.o. male with PMH nephrolithiasis, chronic bacteriuria of unclear clinical significance with chronic storage and irritative voiding symptoms that inconsistently respond to antibiotics, DDD, and BPH who presents today for evaluation of possible UTI.   He has had an extensive workup per Dr. Francisca, Clotilda Cornwall, and Dr. Fayette.  He has been referred to [facility] for second opinion, but did not complete recommended urodynamics there.  He was then referred to [facility] to pursue urodynamics, and had a scheduled appointment with them back in May of this year, but he did not attend that appointment.  Today he reports 1 to 2 weeks of increased diffuse suprapubic and low back pain over baseline.  He states the pain is constant and associated with the constant urge to void as well as dysuria and hesitancy.  He had an episode of gross hematuria last week, but denies fevers.  His medication list today includes 2 antibiotics.  He states he does not know if he takes these, because his granddaughter manages his medications.  His granddaughter then joined us  via telephone, and she states that she has never administered his antibiotics that he has always given them to himself.  Notably, he was seen in the ED twice last month with reports of acute bilateral low back pain, which was felt to be MSK in origin.  He thinks he missed his appointment at [facility] urology earlier this year due to illness.  He does not have their phone number to call and reschedule.  In-office UA today positive for trace protein, trace intact blood, nitrites, and 3+ leukocytes; urine microscopy with >30 WBCs/HPF, 3-10 RBCs/HPF, and many bacteria. PVR 29mL.  PMH: Past Medical History:  Diagnosis Date   Anginal pain    Anxiety    Aortic atherosclerosis     Atrial fibrillation (HCC) 01/2019   Bilateral carpal tunnel syndrome    CAD (coronary artery disease)    a.) LHC 2004 --> normal coronaries. b.) normal stress test in 2007 and 2011; c.) Lexiscan 05/20/2014 --> LVEF 55-65%; no significant stress induced ischemia/arrythmia. d.) CT chest 03/25/2019 --> coronaries carcified.   Carotid atherosclerosis, bilateral    Carpal tunnel syndrome, bilateral    CHF (congestive heart failure) (HCC)    Chronic anticoagulation    a.) ASA + apixaban    COPD (chronic obstructive pulmonary disease) (HCC)    CVA (cerebral vascular accident) (HCC)    Degenerative disc disease, cervical    Diastolic dysfunction    a.) TTE 05/29/2014 --> LVEF 60-65%; G1DD.   DVT (deep venous thrombosis) (HCC)    GERD (gastroesophageal reflux disease)    History of 2019 novel coronavirus disease (COVID-19) 02/08/2019   HLD (hyperlipidemia)    Hypertension    Kidney stones    Osteoarthritis of right shoulder    Pneumonia    Respiratory failure, acute (HCC) 09/20/2020   a.) severe respiratory distress 1 hour after urological surgery. CXR (+) for acute pulmonary edema. Transferred to ICU and placed on NIPPV. Questionable aspiration PNA. (+) A.fib with RVR. Improved with ABX, diuresis, and amiodarone .   Skin cancer of face    a.) RIGHT ear and RIGHT forehead; excised.   Syncope    TIA (transient ischemic attack) 2016   Valvular regurgitation    a.) TTE 05/26/2014 --> LVEF 60-65%;  trivial MR, mild TR; no AR or PR. b.) TTE 04/20/2015 --> LVEF 55-60%; trivial MR and PR; no AR or TR.    Surgical History: Past Surgical History:  Procedure Laterality Date   CARDIAC CATHETERIZATION  2004   CARPAL TUNNEL RELEASE Right 06/11/2013   CENTRAL LINE INSERTION  11/14/2020   Procedure: CENTRAL LINE INSERTION;  Surgeon: Anner Alm ORN, MD;  Location: [facility] INVASIVE CV LAB;  Service: Cardiovascular;;   CORONARY/GRAFT ACUTE MI REVASCULARIZATION N/A 11/14/2020   Procedure: Coronary/Graft  Acute MI Revascularization;  Surgeon: Anner Alm ORN, MD;  Location: [facility] INVASIVE CV LAB;  Service: Cardiovascular;  Laterality: N/A;   CYSTOSCOPY W/ RETROGRADES  10/16/2020   Procedure: CYSTOSCOPY WITH RETROGRADE PYELOGRAM;  Surgeon: Francisca Redell BROCKS, MD;  Location: [facility] ORS;  Service: Urology;;   CYSTOSCOPY W/ URETERAL STENT PLACEMENT Left 09/20/2020   Procedure: CYSTOSCOPY WITH RETROGRADE PYELOGRAM/URETERAL STENT PLACEMENT;  Surgeon: Selma Donnice SAUNDERS, MD;  Location: [facility] ORS;  Service: Urology;  Laterality: Left;   CYSTOSCOPY/URETEROSCOPY/HOLMIUM LASER/STENT PLACEMENT Left 10/16/2020   Procedure: CYSTOSCOPY/URETEROSCOPY/HOLMIUM LASER/STENT PLACEMENT;  Surgeon: Francisca Redell BROCKS, MD;  Location: [facility] ORS;  Service: Urology;  Laterality: Left;   ESOPHAGOGASTRODUODENOSCOPY N/A 11/06/2020   Procedure: ESOPHAGOGASTRODUODENOSCOPY (EGD);  Surgeon: Jinny Carmine, MD;  Location: [facility] ENDOSCOPY;  Service: Endoscopy;  Laterality: N/A;   LEFT HEART CATH AND CORONARY ANGIOGRAPHY N/A 11/14/2020   Procedure: LEFT HEART CATH AND CORONARY ANGIOGRAPHY;  Surgeon: Anner Alm ORN, MD;  Location: [facility] INVASIVE CV LAB;  Service: Cardiovascular;  Laterality: N/A;   LEFT HEART CATH AND CORONARY ANGIOGRAPHY Left 01/28/2022   Procedure: LEFT HEART CATH AND CORONARY ANGIOGRAPHY;  Surgeon: [name], MD;  Location: [facility] INVASIVE CV LAB;  Service: Cardiovascular;  Laterality: Left;   LEFT HEART CATH AND CORONARY ANGIOGRAPHY Left 08/04/2023   Procedure: LEFT HEART CATH AND CORONARY ANGIOGRAPHY with possible coronary intervention;  Surgeon: [name], MD;  Location: [facility] INVASIVE CV LAB;  Service: Cardiovascular;  Laterality: Left;   SHOULDER ARTHROSCOPY WITH OPEN ROTATOR CUFF REPAIR Right 08/24/2017   Procedure: SHOULDER ARTHROSCOPY WITH OPEN ROTATOR CUFF REPAIR;  Surgeon: Edie Norleen PARAS, MD;  Location: [facility] ORS;  Service: Orthopedics;  Laterality: Right;   SKIN CANCER EXCISION  12/01/2016   right ear    SKIN CANCER  EXCISION     remove from the right side of the face    THROMBECTOMY Right 2004   leg   THYROIDECTOMY  1950   Not sure if total or partial thyroidectomy.    Home Medications:  Allergies as of 11/28/2023       Reactions   Hydromorphone     Hallucination Pt reports he doesn't know about this        Medication List        Accurate as of November 28, 2023  1:10 PM. If you have any questions, ask your nurse or doctor.          arformoterol  15 MCG/2ML Nebu Commonly known as: BROVANA  Take 2 mLs (15 mcg total) by nebulization 2 (two) times daily.   aspirin  EC 81 MG tablet Take 81 mg by mouth daily after supper.   atorvastatin  80 MG tablet Commonly known as: Lipitor  Take 1 tablet (80 mg total) by mouth daily.   budesonide  0.5 MG/2ML nebulizer solution Commonly known as: PULMICORT  Take 2 mLs (0.5 mg total) by nebulization 2 (two) times daily.   cefdinir  300 MG capsule Commonly known as: OMNICEF  Take 300 mg by mouth 2 (two) times daily.  cephALEXin  500 MG capsule Commonly known as: KEFLEX  Take 1 capsule (500 mg total) by mouth 2 (two) times daily.   clopidogrel  75 MG tablet Commonly known as: PLAVIX  TAKE 1 TABLET BY MOUTH EVERY DAY   dapagliflozin propanediol 10 MG Tabs tablet Commonly known as: Farxiga Take 1 tablet (10 mg total) by mouth daily before breakfast.   DULoxetine  30 MG capsule Commonly known as: CYMBALTA  Take 1 capsule (30 mg total) by mouth daily.   Ferate 240 (27 Fe) MG tablet Generic drug: ferrous gluconate  Take 1 tablet (240 mg total) by mouth daily.   finasteride  5 MG tablet Commonly known as: PROSCAR  TAKE 1 TABLET (5 MG TOTAL) BY MOUTH DAILY.   HYDROcodone -acetaminophen  5-325 MG tablet Commonly known as: NORCO/VICODIN Take 1 tablet by mouth every 6 (six) hours as needed.   isosorbide  mononitrate 30 MG 24 hr tablet Commonly known as: IMDUR  Take 1 tablet (30 mg total) by mouth daily.   ketorolac  0.5 % ophthalmic solution Commonly  known as: ACULAR  Place 1 drop into the left eye 4 (four) times daily.   losartan  50 MG tablet Commonly known as: COZAAR  TAKE 1 TABLET BY MOUTH EVERY DAY   metoprolol  succinate 25 MG 24 hr tablet Commonly known as: TOPROL -XL TAKE 1 TABLET (25 MG TOTAL) BY MOUTH DAILY.   moxifloxacin 0.5 % ophthalmic solution Commonly known as: VIGAMOX Place 1 drop into the left eye 4 (four) times daily.   nitroGLYCERIN  0.4 MG SL tablet Commonly known as: NITROSTAT  Place 0.4 mg under the tongue every 5 (five) minutes as needed for chest pain.   Ohtuvayre  3 MG/2.5ML Susp Generic drug: Ensifentrine  Inhale 2.5 mLs into the lungs 2 (two) times daily.   ondansetron  4 MG disintegrating tablet Commonly known as: ZOFRAN -ODT Take 1 tablet (4 mg total) by mouth every 6 (six) hours as needed for nausea or vomiting.   pantoprazole  40 MG tablet Commonly known as: PROTONIX  TAKE 1 TABLET BY MOUTH TWICE A DAY   potassium chloride  SA 20 MEQ tablet Commonly known as: KLOR-CON  M TAKE 1 TABLET BY MOUTH EVERY DAY   ranolazine  1000 MG SR tablet Commonly known as: RANEXA  TAKE 1 TABLET BY MOUTH TWICE A DAY   roflumilast  500 MCG Tabs tablet Commonly known as: Daliresp  Take 1 tablet (500 mcg total) by mouth daily.   tamsulosin  0.4 MG Caps capsule Commonly known as: FLOMAX  TAKE 1 CAPSULE BY MOUTH EVERY DAY   torsemide  20 MG tablet Commonly known as: DEMADEX  Take 3 tablets (60 mg total) by mouth daily.   traMADol  50 MG tablet Commonly known as: ULTRAM  Take 1 tablet (50 mg total) by mouth 3 (three) times daily as needed for severe pain (pain score 7-10).   Vios LC Plus Misc        Allergies:  Allergies  Allergen Reactions   Hydromorphone      Hallucination Pt reports he doesn't know about this    Family History: Family History  Problem Relation Age of Onset   Hypertension Mother    Heart disease Mother    CAD Father    Heart attack Father     Social History:   reports that he quit  smoking about 20 years ago. His smoking use included cigarettes. He started smoking about 66 years ago. He has a 46.1 pack-year smoking history. He has been exposed to tobacco smoke. He quit smokeless tobacco use about 20 years ago.  His smokeless tobacco use included snuff. He reports that he does not currently use  alcohol after a past usage of about 7.0 standard drinks of alcohol per week. He reports that he does not use drugs.  Physical Exam: BP (!) 148/71 (BP Location: Left Arm, Patient Position: Sitting, Cuff Size: Normal)   Pulse 84   Ht 6' 2 (1.88 m)   Wt 142 lb 12.8 oz (64.8 kg)   SpO2 98%   BMI 18.33 kg/m   Constitutional:  Alert, no acute distress, nontoxic appearing HEENT: [place], AT Cardiovascular: No clubbing, cyanosis, or edema Respiratory: Normal respiratory effort, no increased work of breathing GI: Abdomen is soft, nontender, nondistended, no abdominal masses Skin: No rashes, bruises or suspicious lesions Neurologic: Grossly intact, no focal deficits, moving all 4 extremities Psychiatric: Normal mood and affect  Laboratory Data: Results for orders placed or performed in visit on 11/28/23  Microscopic Examination   Collection Time: 11/28/23 10:25 AM   Urine  Result Value Ref Range   WBC, UA >30 (A) 0 - 5 /hpf   RBC, Urine 3-10 (A) 0 - 2 /hpf   Epithelial Cells (non renal) 0-10 0 - 10 /hpf   Mucus, UA Present (A) Not Estab.   Bacteria, UA Many (A) None seen/Few  Urinalysis, Complete   Collection Time: 11/28/23 10:25 AM  Result Value Ref Range   Specific Gravity, UA 1.015 1.005 - 1.030   pH, UA 6.0 5.0 - 7.5   Color, UA Yellow Yellow   Appearance Ur Cloudy (A) Clear   Leukocytes,UA 3+ (A) Negative   Protein,UA Trace Negative/Trace   Glucose, UA Negative Negative   Ketones, UA Negative Negative   RBC, UA Trace (A) Negative   Bilirubin, UA Negative Negative   Urobilinogen, Ur 0.2 0.2 - 1.0 mg/dL   Nitrite, UA Positive (A) Negative   Microscopic Examination See  below:   Bladder Scan (Post Void Residual) in office   Collection Time: 11/28/23 11:20 AM  Result Value Ref Range   Scan Result 29ml    Assessment & Plan:   1. Suprapubic pain (Primary) Acute on chronic low back and suprapubic pain.  I find the timing of this relative to his ED visits last month to be curious and strongly suspect an element of radicular back pain.  I am referring him to neurosurgery for further evaluation of this.  He is emptying appropriately. - Bladder Scan (Post Void Residual) in office - Ambulatory referral to Neurosurgery  2. Bacteriuria He is not a reliable historian and has not consistently responded favorably to antibiotic therapy.  I also question possible antibiotic noncompliance per his history today.   I strongly suspect he is chronically colonized, and UA is positive as is typical for him.  We discussed again his extremely challenging presentation and that I may not have much additional to offer him.  I recommended that he follow-up with [facility] as previously referred.  I have asked my team to find out if we can get this appointment rescheduled for him.  Will send the urine for culture, though I fully anticipate it will grow ESBL E. Coli.  I am not prescribing additional antibiotics until his culture finalizes.  Even in that case, I have serious doubts that antibiotic therapy will resolve his chronic symptoms. - Urinalysis, Complete - CULTURE, URINE COMPREHENSIVE  Return for Will call with results.  Lucie Hones, PA-C  [facility] [place] [address] [place], [place] [address] [phone]

## 2023-11-30 NOTE — Telephone Encounter (Signed)
 Please contact the patient and notify him of this.  I strongly encourage him to return to [facility] for urodynamics.  Alternatively, we could consider [facility].

## 2023-12-01 ENCOUNTER — Telehealth: Payer: Self-pay

## 2023-12-01 ENCOUNTER — Other Ambulatory Visit: Payer: Self-pay

## 2023-12-01 DIAGNOSIS — R1024 Suprapubic pain: Secondary | ICD-10-CM

## 2023-12-01 LAB — CULTURE, URINE COMPREHENSIVE

## 2023-12-01 NOTE — Telephone Encounter (Signed)
 Gave patient results that [name] Browning wanted him to follow back up with [facility] in [place]. Pt voiced understanding.

## 2023-12-01 NOTE — Telephone Encounter (Signed)
 2nd phone call attempted. Voicemail left for a return call to the clinic.

## 2023-12-01 NOTE — Telephone Encounter (Signed)
 Spoke with patient and advised him that I reached out to [facility] to check on previous referral for Urodynamics, the referral is still active however, his insurance would become out of network beginning 01/11/2024 and their office has no availability until after 01/2024. Advised patient that Sam strongly encourages that he should return to [facility] for urodynamics.  Alternatively, we could consider [facility]. Patient stated he would like to try [facility]. Referral placed. Awaiting approval. Patient also asked about his culture results I assured him once those results are received and Sam has looked over them we would be in touch to proceed further. Patient voiced understanding.

## 2023-12-01 NOTE — Telephone Encounter (Signed)
 1st phone call attempted. Voicemail left for patient to return call to the clinic and ask for [employer].

## 2023-12-04 ENCOUNTER — Ambulatory Visit: Payer: Self-pay | Admitting: Physician Assistant

## 2023-12-05 ENCOUNTER — Ambulatory Visit (INDEPENDENT_AMBULATORY_CARE_PROVIDER_SITE_OTHER): Admitting: Cardiovascular Disease

## 2023-12-05 ENCOUNTER — Encounter: Payer: Self-pay | Admitting: Cardiovascular Disease

## 2023-12-05 VITALS — BP 101/72 | HR 71 | Ht 74.0 in | Wt 150.2 lb

## 2023-12-05 DIAGNOSIS — E782 Mixed hyperlipidemia: Secondary | ICD-10-CM

## 2023-12-05 DIAGNOSIS — R079 Chest pain, unspecified: Secondary | ICD-10-CM

## 2023-12-05 DIAGNOSIS — I214 Non-ST elevation (NSTEMI) myocardial infarction: Secondary | ICD-10-CM

## 2023-12-05 DIAGNOSIS — I5032 Chronic diastolic (congestive) heart failure: Secondary | ICD-10-CM

## 2023-12-05 DIAGNOSIS — Z955 Presence of coronary angioplasty implant and graft: Secondary | ICD-10-CM

## 2023-12-05 DIAGNOSIS — I480 Paroxysmal atrial fibrillation: Secondary | ICD-10-CM

## 2023-12-05 DIAGNOSIS — I10 Essential (primary) hypertension: Secondary | ICD-10-CM | POA: Diagnosis not present

## 2023-12-05 NOTE — Progress Notes (Signed)
 Cardiology Office Note   Date:  12/05/2023   ID:  FRANKIE SCIPIO, DOB 1940-07-05, MRN [id number]  PCP:  Fernand Fredy RAMAN, MD  Cardiologist:  [name], MD      History of Present Illness: [name] Browning is a 83 y.o. male who presents for  Chief Complaint  Patient presents with   Follow-up    2 month follow up     Doing well.      Past Medical History:  Diagnosis Date   Anginal pain    Anxiety    Aortic atherosclerosis    Atrial fibrillation (HCC) 01/2019   Bilateral carpal tunnel syndrome    CAD (coronary artery disease)    a.) LHC 2004 --> normal coronaries. b.) normal stress test in 2007 and 2011; c.) Lexiscan 05/20/2014 --> LVEF 55-65%; no significant stress induced ischemia/arrythmia. d.) CT chest 03/25/2019 --> coronaries carcified.   Carotid atherosclerosis, bilateral    Carpal tunnel syndrome, bilateral    CHF (congestive heart failure) (HCC)    Chronic anticoagulation    a.) ASA + apixaban    COPD (chronic obstructive pulmonary disease) (HCC)    CVA (cerebral vascular accident) (HCC)    Degenerative disc disease, cervical    Diastolic dysfunction    a.) TTE 05/29/2014 --> LVEF 60-65%; G1DD.   DVT (deep venous thrombosis) (HCC)    GERD (gastroesophageal reflux disease)    History of 2019 novel coronavirus disease (COVID-19) 02/08/2019   HLD (hyperlipidemia)    Hypertension    Kidney stones    Osteoarthritis of right shoulder    Pneumonia    Respiratory failure, acute (HCC) 09/20/2020   a.) severe respiratory distress 1 hour after urological surgery. CXR (+) for acute pulmonary edema. Transferred to ICU and placed on NIPPV. Questionable aspiration PNA. (+) A.fib with RVR. Improved with ABX, diuresis, and amiodarone .   Skin cancer of face    a.) RIGHT ear and RIGHT forehead; excised.   Syncope    TIA (transient ischemic attack) 2016   Valvular regurgitation    a.) TTE 05/26/2014 --> LVEF 60-65%; trivial MR, mild TR; no AR or PR. b.) TTE 04/20/2015 -->  LVEF 55-60%; trivial MR and PR; no AR or TR.     Past Surgical History:  Procedure Laterality Date   CARDIAC CATHETERIZATION  2004   CARPAL TUNNEL RELEASE Right 06/11/2013   CENTRAL LINE INSERTION  11/14/2020   Procedure: CENTRAL LINE INSERTION;  Surgeon: Anner Alm ORN, MD;  Location: [facility] INVASIVE CV LAB;  Service: Cardiovascular;;   CORONARY/GRAFT ACUTE MI REVASCULARIZATION N/A 11/14/2020   Procedure: Coronary/Graft Acute MI Revascularization;  Surgeon: Anner Alm ORN, MD;  Location: [facility] INVASIVE CV LAB;  Service: Cardiovascular;  Laterality: N/A;   CYSTOSCOPY W/ RETROGRADES  10/16/2020   Procedure: CYSTOSCOPY WITH RETROGRADE PYELOGRAM;  Surgeon: Francisca Redell BROCKS, MD;  Location: [facility] ORS;  Service: Urology;;   CYSTOSCOPY W/ URETERAL STENT PLACEMENT Left 09/20/2020   Procedure: CYSTOSCOPY WITH RETROGRADE PYELOGRAM/URETERAL STENT PLACEMENT;  Surgeon: Selma Donnice SAUNDERS, MD;  Location: [facility] ORS;  Service: Urology;  Laterality: Left;   CYSTOSCOPY/URETEROSCOPY/HOLMIUM LASER/STENT PLACEMENT Left 10/16/2020   Procedure: CYSTOSCOPY/URETEROSCOPY/HOLMIUM LASER/STENT PLACEMENT;  Surgeon: Francisca Redell BROCKS, MD;  Location: [facility] ORS;  Service: Urology;  Laterality: Left;   ESOPHAGOGASTRODUODENOSCOPY N/A 11/06/2020   Procedure: ESOPHAGOGASTRODUODENOSCOPY (EGD);  Surgeon: Jinny Carmine, MD;  Location: [facility] ENDOSCOPY;  Service: Endoscopy;  Laterality: N/A;   LEFT HEART CATH AND CORONARY ANGIOGRAPHY N/A 11/14/2020   Procedure: LEFT HEART CATH AND  CORONARY ANGIOGRAPHY;  Surgeon: Anner Alm ORN, MD;  Location: [facility] INVASIVE CV LAB;  Service: Cardiovascular;  Laterality: N/A;   LEFT HEART CATH AND CORONARY ANGIOGRAPHY Left 01/28/2022   Procedure: LEFT HEART CATH AND CORONARY ANGIOGRAPHY;  Surgeon: [name], MD;  Location: [facility] INVASIVE CV LAB;  Service: Cardiovascular;  Laterality: Left;   LEFT HEART CATH AND CORONARY ANGIOGRAPHY Left 08/04/2023   Procedure: LEFT HEART CATH AND CORONARY ANGIOGRAPHY with  possible coronary intervention;  Surgeon: [name], MD;  Location: [facility] INVASIVE CV LAB;  Service: Cardiovascular;  Laterality: Left;   SHOULDER ARTHROSCOPY WITH OPEN ROTATOR CUFF REPAIR Right 08/24/2017   Procedure: SHOULDER ARTHROSCOPY WITH OPEN ROTATOR CUFF REPAIR;  Surgeon: Edie Norleen PARAS, MD;  Location: [facility] ORS;  Service: Orthopedics;  Laterality: Right;   SKIN CANCER EXCISION  12/01/2016   right ear    SKIN CANCER EXCISION     remove from the right side of the face    THROMBECTOMY Right 2004   leg   THYROIDECTOMY  1950   Not sure if total or partial thyroidectomy.     Current Outpatient Medications  Medication Sig Dispense Refill   arformoterol  (BROVANA ) 15 MCG/2ML NEBU Take 2 mLs (15 mcg total) by nebulization 2 (two) times daily. 120 mL 10   aspirin  EC 81 MG tablet Take 81 mg by mouth daily after supper.     atorvastatin  (LIPITOR ) 80 MG tablet Take 1 tablet (80 mg total) by mouth daily. 90 tablet 3   budesonide  (PULMICORT ) 0.5 MG/2ML nebulizer solution Take 2 mLs (0.5 mg total) by nebulization 2 (two) times daily. 1440 mL 0   cefdinir  (OMNICEF ) 300 MG capsule Take 300 mg by mouth 2 (two) times daily.     cephALEXin  (KEFLEX ) 500 MG capsule Take 1 capsule (500 mg total) by mouth 2 (two) times daily. 14 capsule 0   clopidogrel  (PLAVIX ) 75 MG tablet TAKE 1 TABLET BY MOUTH EVERY DAY 90 tablet 3   dapagliflozin  propanediol (FARXIGA ) 10 MG TABS tablet Take 1 tablet (10 mg total) by mouth daily before breakfast. 30 tablet 3   DULoxetine  (CYMBALTA ) 30 MG capsule Take 1 capsule (30 mg total) by mouth daily. 30 capsule 0   Ensifentrine  (OHTUVAYRE ) 3 MG/2.5ML SUSP Inhale 2.5 mLs into the lungs 2 (two) times daily.     ferrous gluconate  (FERGON) 240 (27 FE) MG tablet Take 1 tablet (240 mg total) by mouth daily. 30 tablet 0   finasteride  (PROSCAR ) 5 MG tablet TAKE 1 TABLET (5 MG TOTAL) BY MOUTH DAILY. 90 tablet 3   HYDROcodone -acetaminophen  (NORCO/VICODIN) 5-325 MG tablet Take 1 tablet by  mouth every 6 (six) hours as needed. 12 tablet 0   isosorbide  mononitrate (IMDUR ) 30 MG 24 hr tablet Take 1 tablet (30 mg total) by mouth daily. 30 tablet 1   ketorolac  (ACULAR ) 0.5 % ophthalmic solution Place 1 drop into the left eye 4 (four) times daily.     losartan  (COZAAR ) 50 MG tablet TAKE 1 TABLET BY MOUTH EVERY DAY 90 tablet 1   metoprolol  succinate (TOPROL -XL) 25 MG 24 hr tablet TAKE 1 TABLET (25 MG TOTAL) BY MOUTH DAILY. 90 tablet 3   moxifloxacin (VIGAMOX) 0.5 % ophthalmic solution Place 1 drop into the left eye 4 (four) times daily.     Nebulizers (VIOS LC PLUS) MISC      nitroGLYCERIN  (NITROSTAT ) 0.4 MG SL tablet Place 0.4 mg under the tongue every 5 (five) minutes as needed for chest pain.  ondansetron  (ZOFRAN -ODT) 4 MG disintegrating tablet Take 1 tablet (4 mg total) by mouth every 6 (six) hours as needed for nausea or vomiting. 20 tablet 0   pantoprazole  (PROTONIX ) 40 MG tablet TAKE 1 TABLET BY MOUTH TWICE A DAY 180 tablet 1   potassium chloride  SA (KLOR-CON  M) 20 MEQ tablet TAKE 1 TABLET BY MOUTH EVERY DAY 90 tablet 3   ranolazine  (RANEXA ) 1000 MG SR tablet TAKE 1 TABLET BY MOUTH TWICE A DAY 180 tablet 1   roflumilast  (DALIRESP ) 500 MCG TABS tablet Take 1 tablet (500 mcg total) by mouth daily. 30 tablet 3   tamsulosin  (FLOMAX ) 0.4 MG CAPS capsule TAKE 1 CAPSULE BY MOUTH EVERY DAY 90 capsule 0   torsemide  (DEMADEX ) 20 MG tablet Take 3 tablets (60 mg total) by mouth daily. 90 tablet 5   traMADol  (ULTRAM ) 50 MG tablet Take 1 tablet (50 mg total) by mouth 3 (three) times daily as needed for severe pain (pain score 7-10). 30 tablet 0   No current facility-administered medications for this visit.    Allergies:   Hydromorphone     Social History:   reports that he quit smoking about 20 years ago. His smoking use included cigarettes. He started smoking about 66 years ago. He has a 46.1 pack-year smoking history. He has been exposed to tobacco smoke. He quit smokeless tobacco use  about 20 years ago.  His smokeless tobacco use included snuff. He reports that he does not currently use alcohol after a past usage of about 7.0 standard drinks of alcohol per week. He reports that he does not use drugs.   Family History:  family history includes CAD in his father; Heart attack in his father; Heart disease in his mother; Hypertension in his mother.    ROS:     Review of Systems  Constitutional: Negative.   HENT: Negative.    Eyes: Negative.   Respiratory: Negative.    Gastrointestinal: Negative.   Genitourinary: Negative.   Musculoskeletal: Negative.   Skin: Negative.   Neurological: Negative.   Endo/Heme/Allergies: Negative.   Psychiatric/Behavioral: Negative.    All other systems reviewed and are negative.     All other systems are reviewed and negative.    PHYSICAL EXAM: VS:  BP 101/72   Pulse 71   Ht 6' 2 (1.88 m)   Wt 150 lb 3.2 oz (68.1 kg)   SpO2 93%   BMI 19.28 kg/m  , BMI Body mass index is 19.28 kg/m. Last weight:  Wt Readings from Last 3 Encounters:  12/05/23 150 lb 3.2 oz (68.1 kg)  11/28/23 142 lb 12.8 oz (64.8 kg)  10/10/23 156 lb 9.6 oz (71 kg)     Physical Exam Vitals reviewed.  Constitutional:      Appearance: Normal appearance. He is normal weight.  HENT:     Head: Normocephalic.     Nose: Nose normal.     Mouth/Throat:     Mouth: Mucous membranes are moist.  Eyes:     Pupils: Pupils are equal, round, and reactive to light.  Cardiovascular:     Rate and Rhythm: Normal rate and regular rhythm.     Pulses: Normal pulses.     Heart sounds: Normal heart sounds.  Pulmonary:     Effort: Pulmonary effort is normal.  Abdominal:     General: Abdomen is flat. Bowel sounds are normal.  Musculoskeletal:        General: Normal range of motion.     Cervical back:  Normal range of motion.  Skin:    General: Skin is warm.  Neurological:     General: No focal deficit present.     Mental Status: He is alert.  Psychiatric:         Mood and Affect: Mood normal.       EKG:   Recent Labs: 10/19/2023: ALT 16; BUN 18; Creatinine, Ser 1.32; Hemoglobin 13.4; Platelets 224; Potassium 3.6; Sodium 137    Lipid Panel    Component Value Date/Time   CHOL 283 (H) 11/21/2022 1030   TRIG 98 11/21/2022 1030   HDL 45 11/21/2022 1030   CHOLHDL 3.7 11/12/2020 0435   VLDL 9 11/12/2020 0435   LDLCALC 221 (H) 11/21/2022 1030   LDLCALC 105 (H) 12/07/2016 9041      Other studies Reviewed: Additional studies/ records that were reviewed today include:  Review of the above records demonstrates:       No data to display            ASSESSMENT AND PLAN:    ICD-10-CM   1. Primary hypertension  I10     2. Mixed hyperlipidemia  E78.2     3. NSTEMI (non-ST elevated myocardial infarction) (HCC)  I21.4     4. Chest pain, unspecified type  R07.9    Infrequent now, I get gas pains.    5. AF (paroxysmal atrial fibrillation) (HCC)  I48.0     6. S/P coronary artery stent placement  Z95.5     7. Chronic heart failure with preserved ejection fraction (HCC)  I50.32        Problem List Items Addressed This Visit       Cardiovascular and Mediastinum   AF (paroxysmal atrial fibrillation) (HCC)   NSTEMI (non-ST elevated myocardial infarction) (HCC)     Other   Hyperlipidemia   Chest pain   S/P coronary artery stent placement   Other Visit Diagnoses       Primary hypertension    -  Primary     Chronic heart failure with preserved ejection fraction (HCC)              Disposition:   Return in about 3 months (around 03/06/2024).    Total time spent: 35 minutes  Signed,  Denyse Bathe, MD  12/05/2023 11:09 AM    [facility]

## 2023-12-10 ENCOUNTER — Other Ambulatory Visit: Payer: Self-pay | Admitting: Cardiovascular Disease

## 2023-12-10 DIAGNOSIS — R0789 Other chest pain: Secondary | ICD-10-CM

## 2023-12-10 DIAGNOSIS — I2510 Atherosclerotic heart disease of native coronary artery without angina pectoris: Secondary | ICD-10-CM

## 2023-12-10 DIAGNOSIS — I10 Essential (primary) hypertension: Secondary | ICD-10-CM

## 2023-12-10 DIAGNOSIS — I5033 Acute on chronic diastolic (congestive) heart failure: Secondary | ICD-10-CM

## 2023-12-10 DIAGNOSIS — R079 Chest pain, unspecified: Secondary | ICD-10-CM

## 2023-12-10 DIAGNOSIS — I4891 Unspecified atrial fibrillation: Secondary | ICD-10-CM

## 2024-01-11 ENCOUNTER — Other Ambulatory Visit: Payer: Self-pay | Admitting: Internal Medicine

## 2024-01-11 DIAGNOSIS — F411 Generalized anxiety disorder: Secondary | ICD-10-CM

## 2024-01-14 IMAGING — CR DG CHEST 2V
2 series · 3 of 3 positions shown · non-contrast
Comparison: 11/15/2020

CLINICAL DATA: Chest pain, short of breath, coronary artery
disease, tobacco abuse

EXAM:
CHEST - 2 VIEW

[Series 2: chest lat · 0.14mm/px · 2 of 2 slices shown]
[im 1/2]
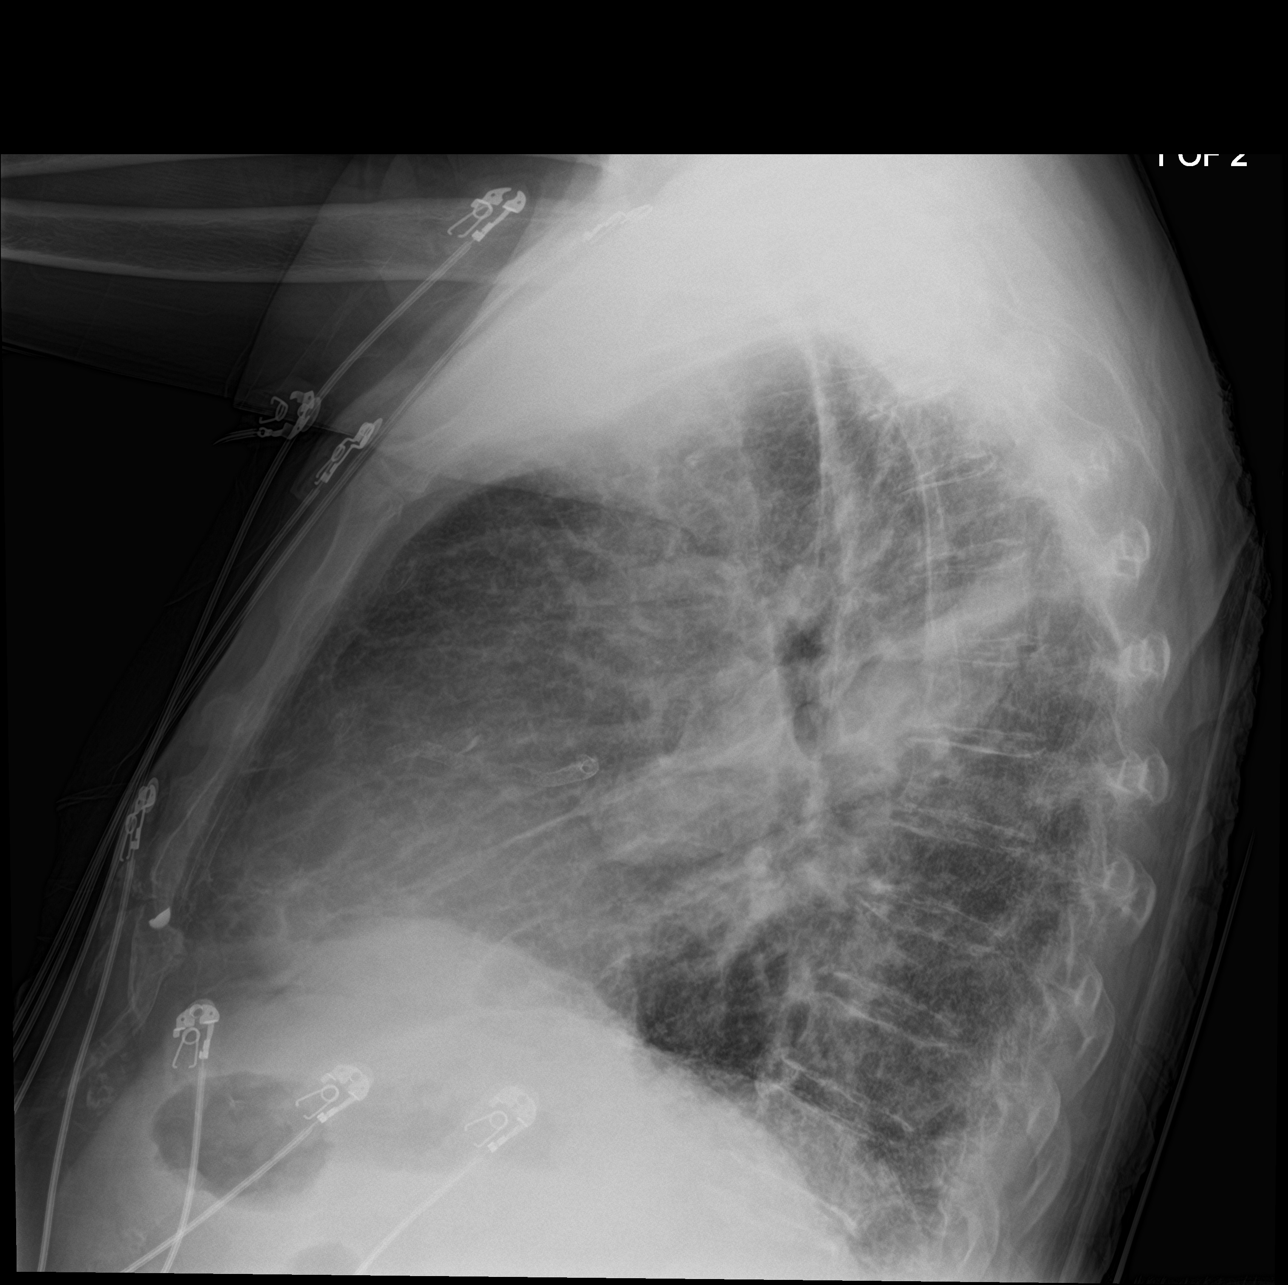
[im 2/2]
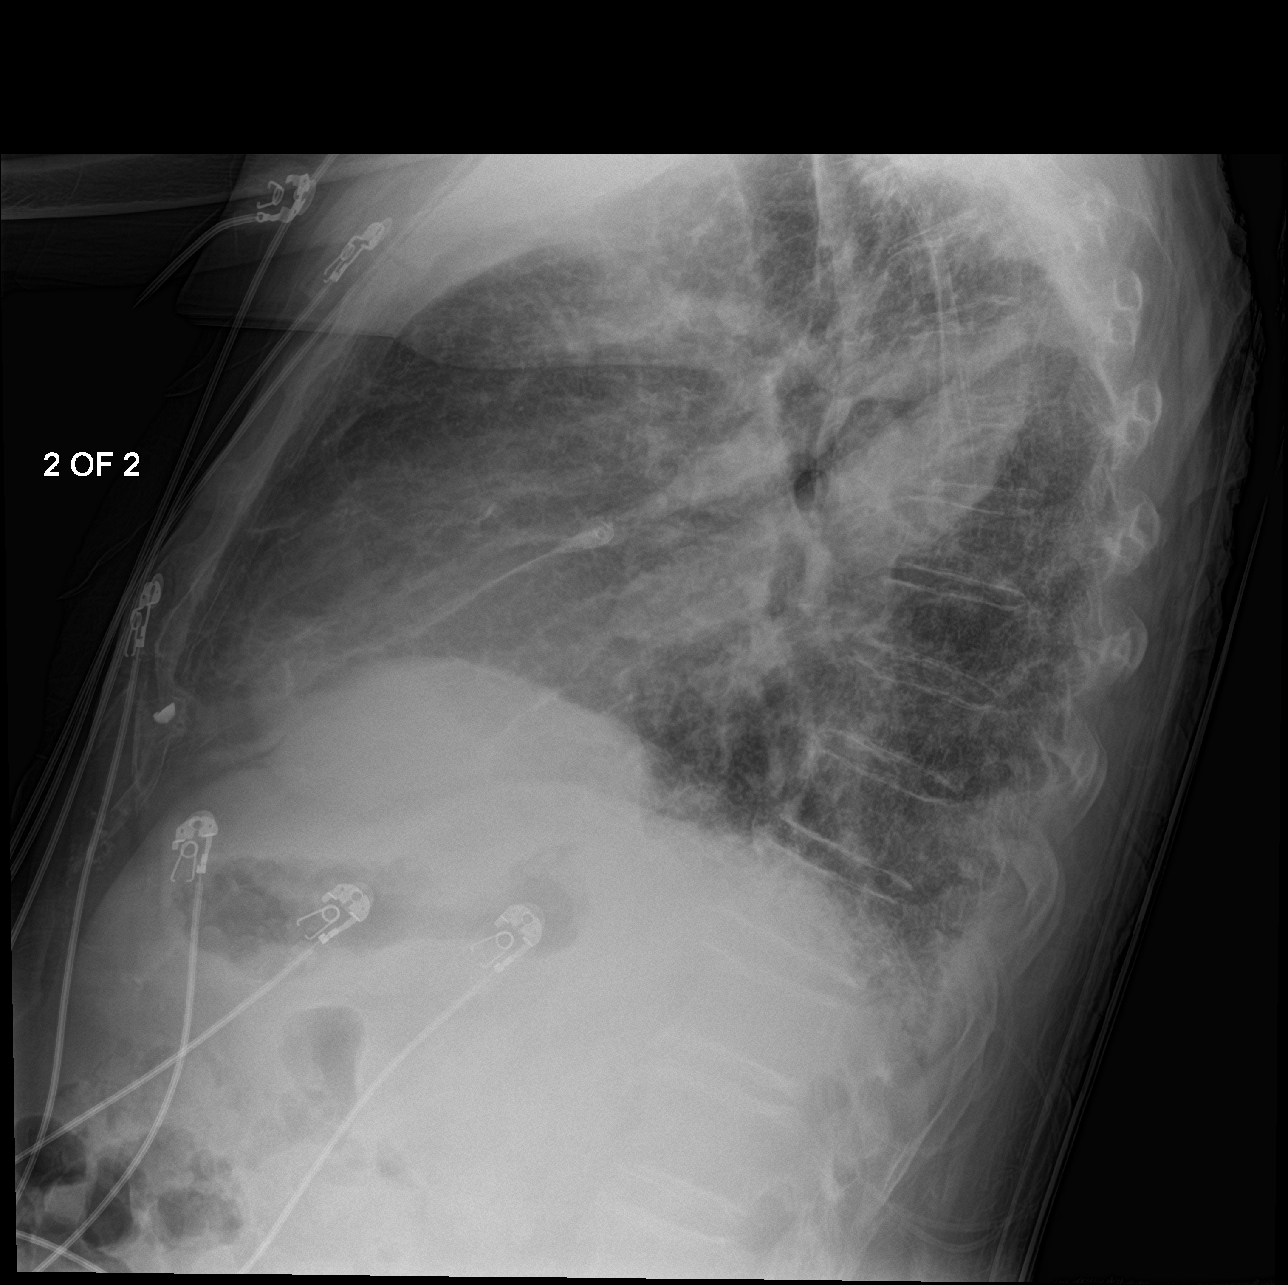

[chest ap]
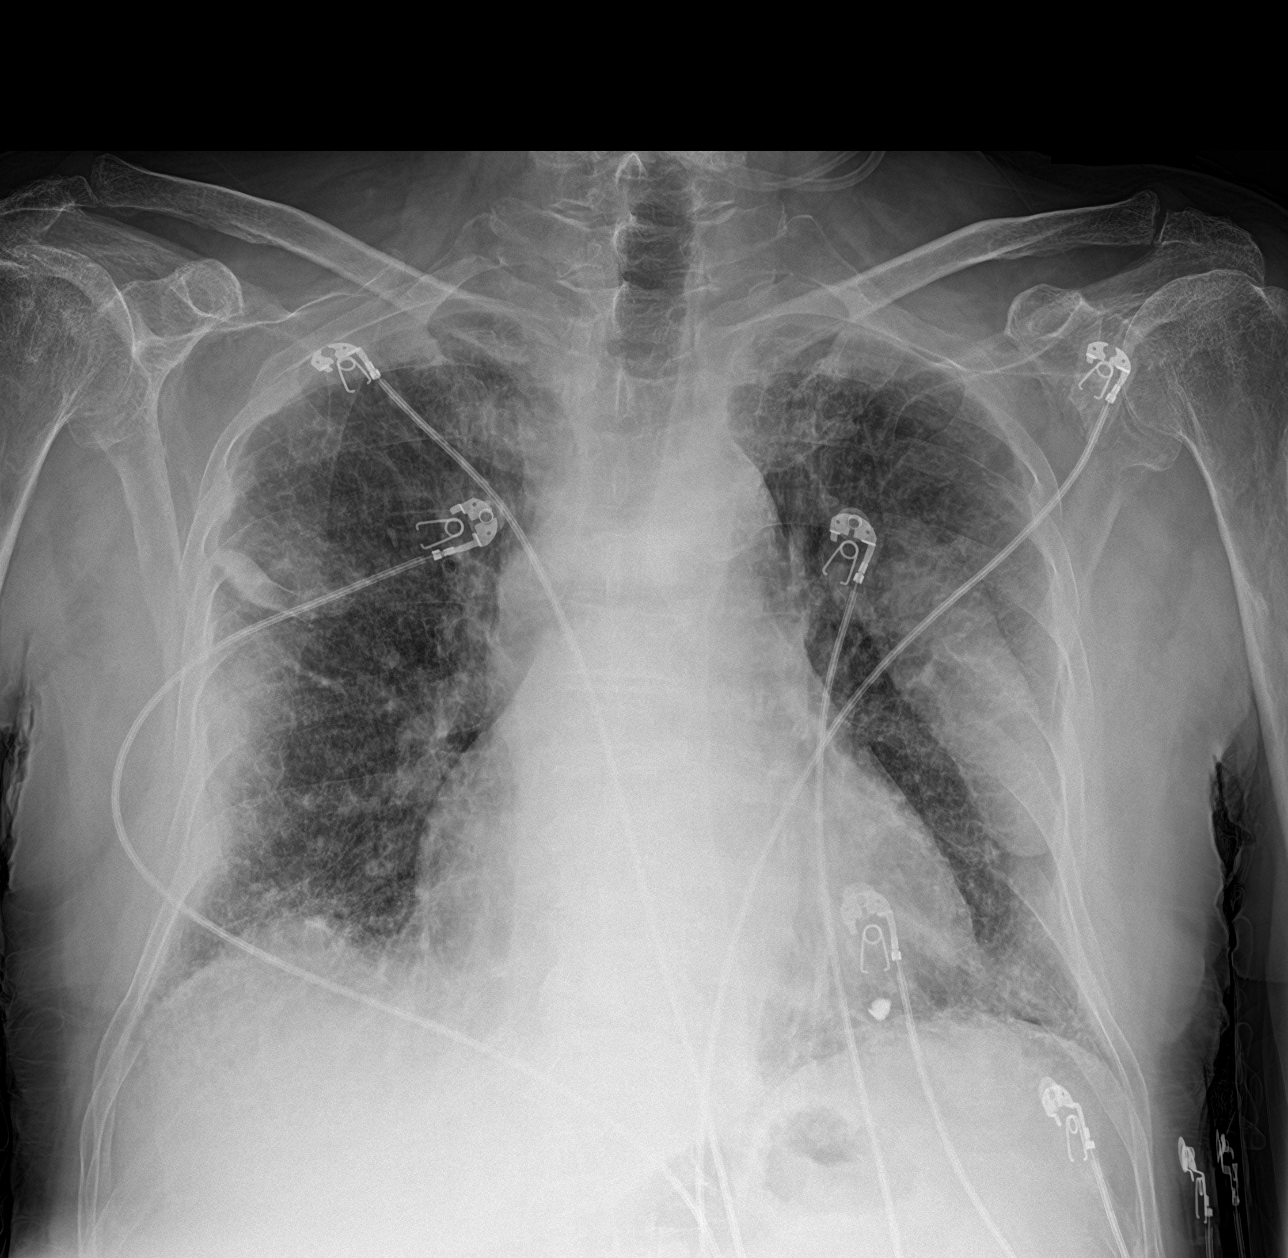

[3 of 3 positions shown; findings below may reference images not displayed]

FINDINGS: Frontal and lateral views of the chest demonstrate mild enlargement
the cardiac silhouette. Diffuse interstitial prominence unchanged,
which may reflect a combination of interstitial edema and scarring.
Loculated fluid is seen within the bilateral major fissures, left
greater than right. No focal consolidation or pneumothorax. No acute
bony abnormalities.
IMPRESSION: 1. Loculated bilateral pleural effusions within the major fissures,
left greater than right.
2. Diffuse increased interstitial prominence, likely a combination
of interstitial edema and chronic scarring.

## 2024-01-14 IMAGING — CT CT CHEST W/O CM
2 of 4 series · 15 of 36 positions shown, 18 images · non-contrast
Comparison: Chest radiograph dated 03/24/2021

CLINICAL DATA: Pleural effusions.  Concern for malignancy.



[Series 2: thorax · axial · 0.74mm/px · z∈[-920,-638]mm · 12 of 167 slices shown, 15 images]
[im 13/167  mediastinal]
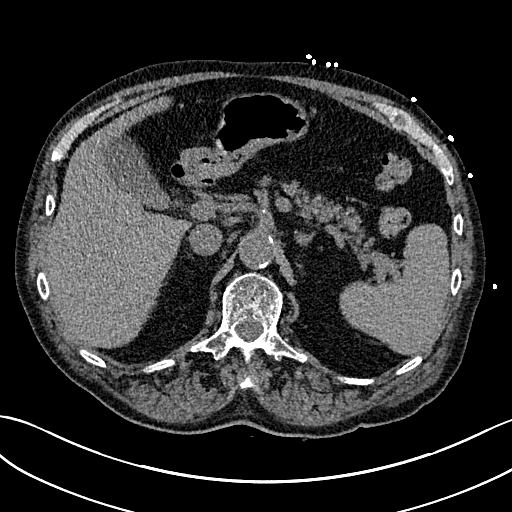
[im 13/167  lung]
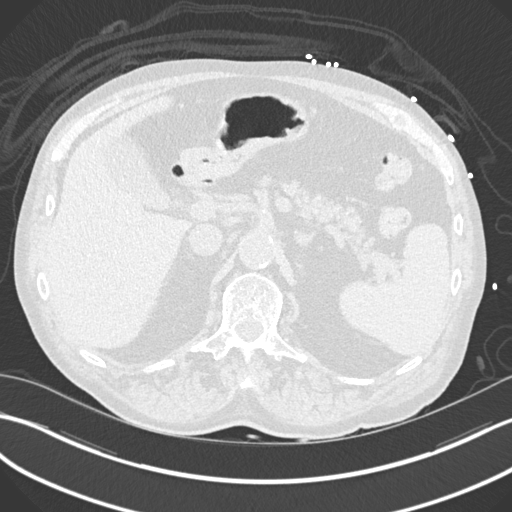
[im 26/167  lung]
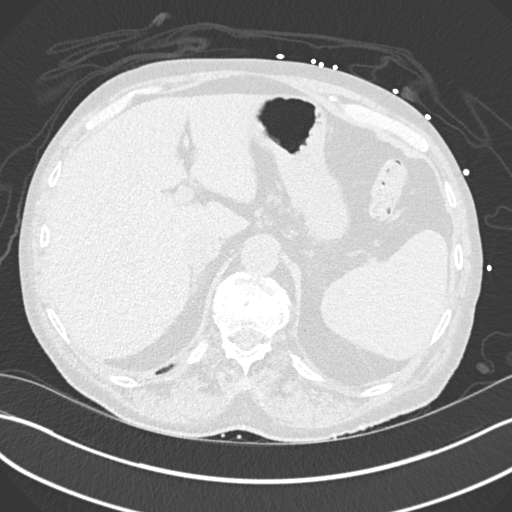
[im 39/167  lung]
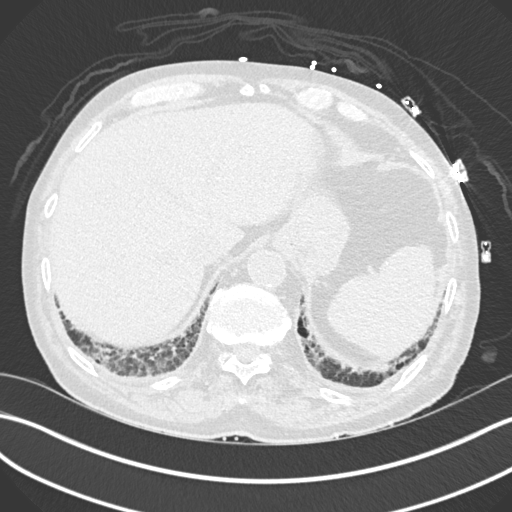
[im 52/167  lung]
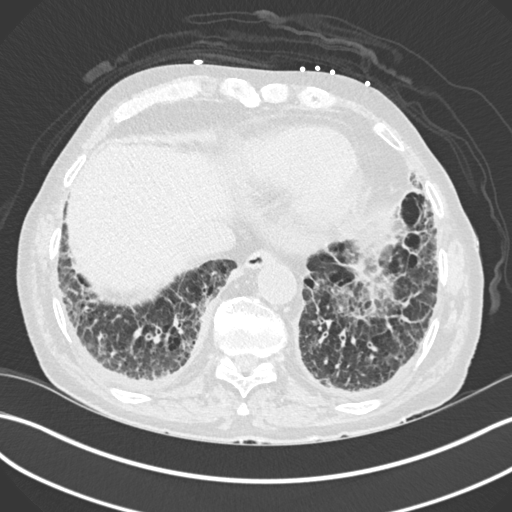
[im 64/167  mediastinal]
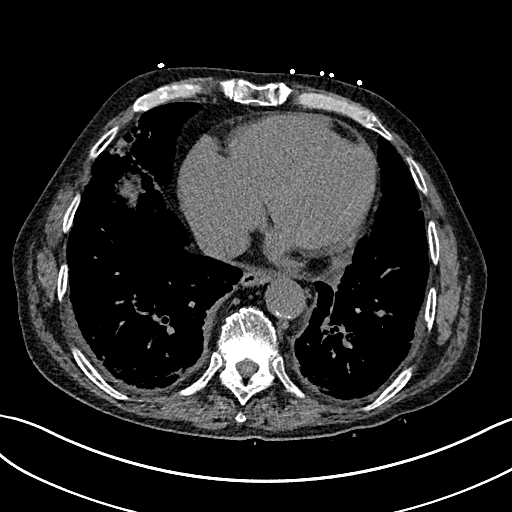
[im 64/167  lung]
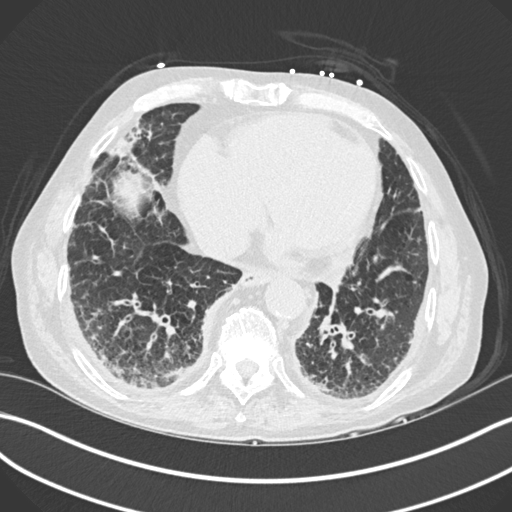
[im 77/167  lung]
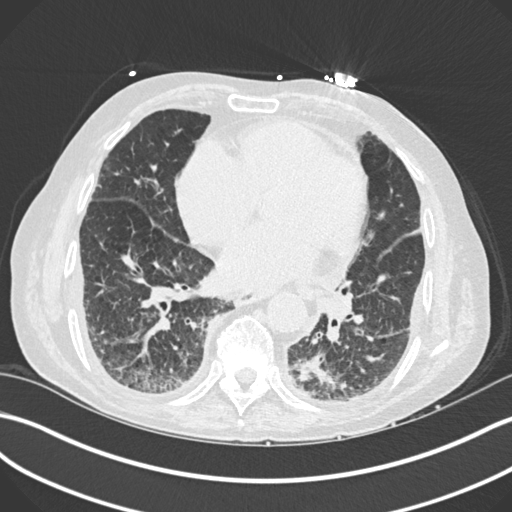
[im 90/167  lung]
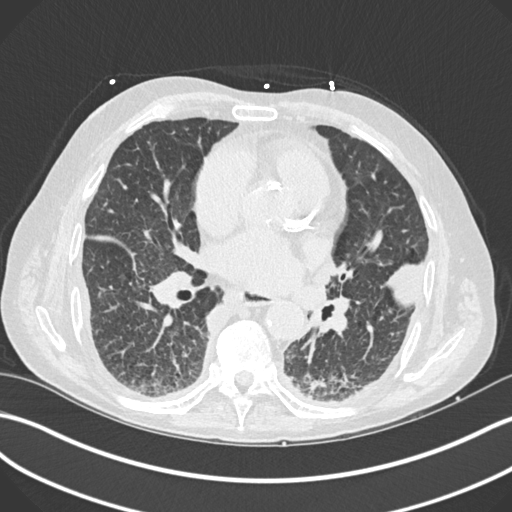
[im 103/167  lung]
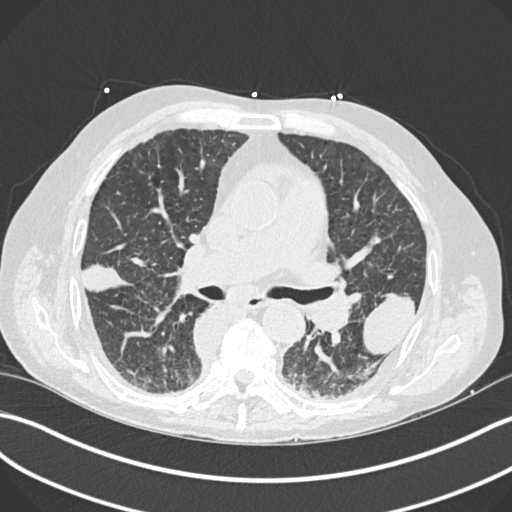
[im 115/167  mediastinal]
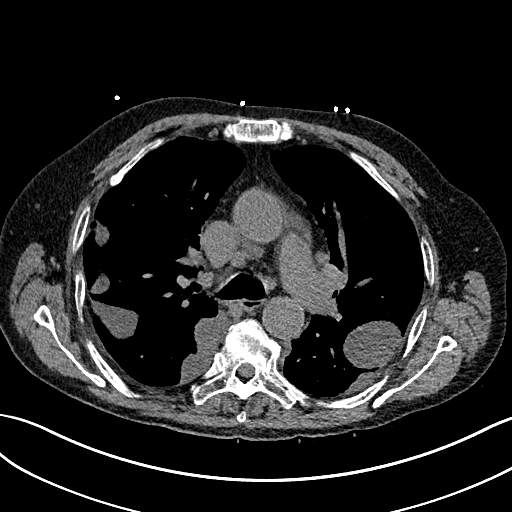
[im 115/167  lung]
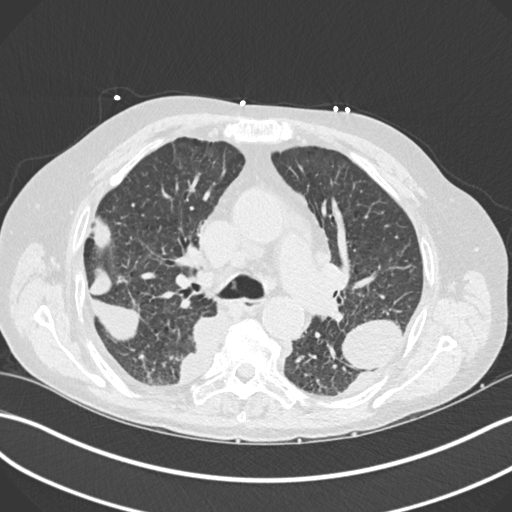
[im 128/167  lung]
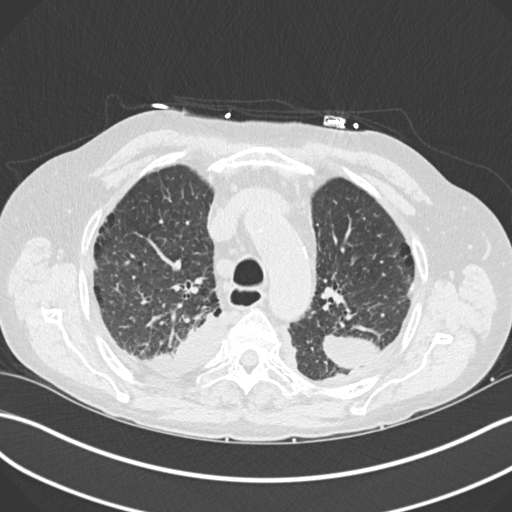
[im 141/167  lung]
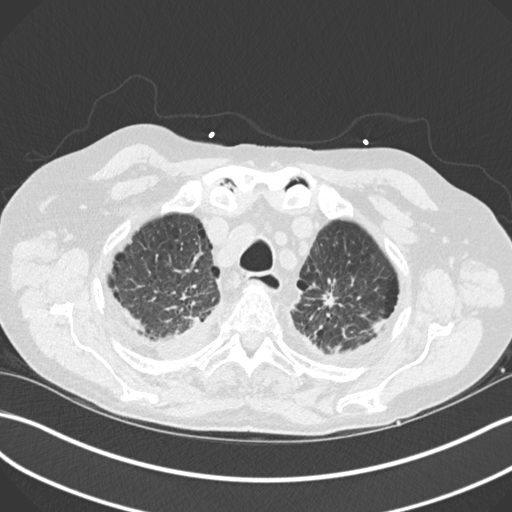
[im 154/167  lung]
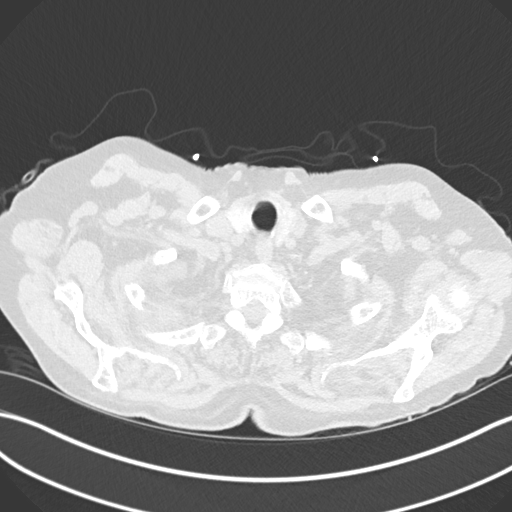

[Series 5: coronal · coronal · 0.69mm/px · 3 of 140 slices shown]
[im 28/140  lung]
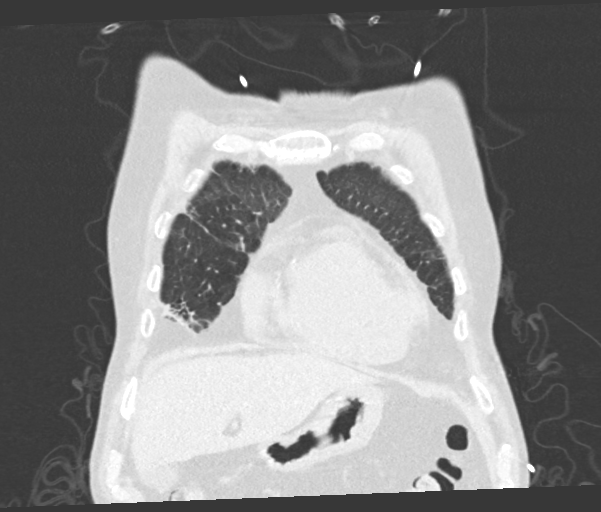
[im 56/140  lung]
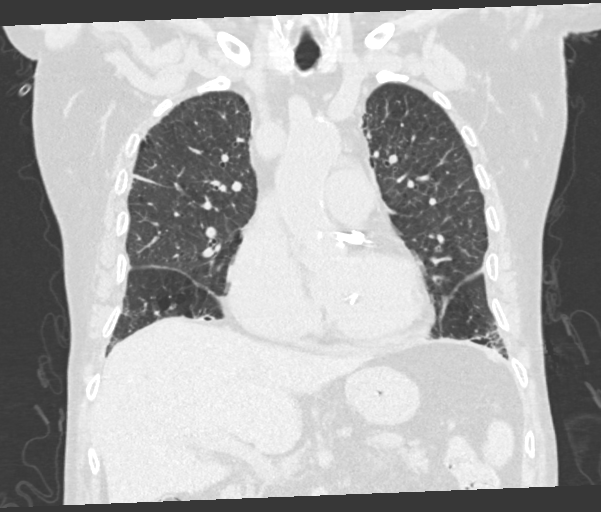
[im 84/140  lung]
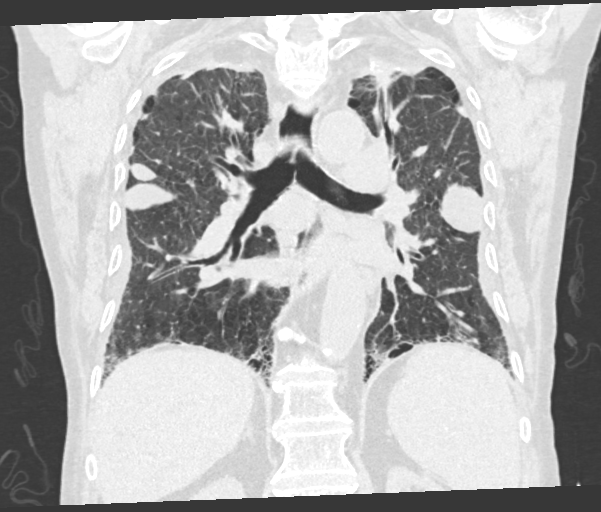

[15 of 36 positions shown; findings below may reference images not displayed]

FINDINGS: Evaluation of this exam is limited in the absence of intravenous
contrast.

Cardiovascular: Borderline cardiomegaly. Small pericardial effusion
measuring 5 mm in thickness. Advanced coronary vascular
calcification primarily involving the LAD and RCA. Mild
atherosclerotic calcification of the thoracic aorta. No aneurysmal
dilatation. The central pulmonary arteries are grossly unremarkable
on this noncontrast CT.

Mediastinum/Nodes: Left hilar adenopathy measures 18 mm in short
axis. Subcarinal lymph node measures 13 mm in short axis. Rounded
right paratracheal lymph node measures 13 mm. The esophagus is
grossly unremarkable. No mediastinal fluid collection.

Lungs/Pleura: Background of emphysema. There is diffuse interstitial
and interlobular septal prominence concerning for edema. Similar
appearance of biapical subpleural scarring with a focal area of
spiculated scarring in the left apex, similar to prior CT. Scattered
bilateral lower lobe predominant nodularity, new since the prior CT
suspicious for pneumonia, possibly atypical in etiology. Clinical
correlation and follow-up recommended. Small bilateral pleural
effusions packing into the fissures bilaterally and slightly
increased since the prior CT. No pneumothorax. The central airways
are patent.

Upper Abdomen: No acute abnormality.

Musculoskeletal: Osteopenia with degenerative changes of the spine.
Multiple old right rib fractures. No acute osseous pathology.
IMPRESSION: 1. Bilateral lower lobe predominant nodularity, new since the prior
CT suspicious for pneumonia, possibly atypical in etiology.
Follow-up recommended.
2. Small bilateral pleural effusions, similar or slightly increased
since the prior CT.
3. Borderline cardiomegaly with small pericardial effusion.
4. Advanced coronary vascular calcification.
5. Hilar and mediastinal adenopathy as well as biapical irregular
scarring, present on the prior CT. Attention on follow-up imaging
recommended
6. Aortic Atherosclerosis ([id number]) and Emphysema ([id number].O).

## 2024-02-03 ENCOUNTER — Other Ambulatory Visit: Payer: Self-pay | Admitting: Internal Medicine

## 2024-02-03 DIAGNOSIS — F411 Generalized anxiety disorder: Secondary | ICD-10-CM

## 2024-02-13 ENCOUNTER — Other Ambulatory Visit: Payer: Self-pay | Admitting: Family

## 2024-03-07 ENCOUNTER — Ambulatory Visit: Admitting: Cardiovascular Disease
